# Patient Record
Sex: Male | Born: 1960
Health system: Southern US, Community
[De-identification: ages and names within clinical notes are randomized; demographics above are authoritative.]

## PROBLEM LIST (undated history)

## (undated) DIAGNOSIS — R519 Headache, unspecified: Secondary | ICD-10-CM

## (undated) DIAGNOSIS — I480 Paroxysmal atrial fibrillation: Secondary | ICD-10-CM

## (undated) DIAGNOSIS — R32 Unspecified urinary incontinence: Secondary | ICD-10-CM

## (undated) DIAGNOSIS — G4733 Obstructive sleep apnea (adult) (pediatric): Secondary | ICD-10-CM

## (undated) DIAGNOSIS — M1711 Unilateral primary osteoarthritis, right knee: Secondary | ICD-10-CM

## (undated) DIAGNOSIS — I10 Essential (primary) hypertension: Secondary | ICD-10-CM

## (undated) DIAGNOSIS — R079 Chest pain, unspecified: Secondary | ICD-10-CM

## (undated) DIAGNOSIS — J189 Pneumonia, unspecified organism: Secondary | ICD-10-CM

## (undated) DIAGNOSIS — M1712 Unilateral primary osteoarthritis, left knee: Secondary | ICD-10-CM

## (undated) DIAGNOSIS — T8482XA Fibrosis due to internal orthopedic prosthetic devices, implants and grafts, initial encounter: Secondary | ICD-10-CM

## (undated) DIAGNOSIS — E669 Obesity, unspecified: Secondary | ICD-10-CM

## (undated) DIAGNOSIS — I119 Hypertensive heart disease without heart failure: Secondary | ICD-10-CM

## (undated) DIAGNOSIS — R0600 Dyspnea, unspecified: Secondary | ICD-10-CM

## (undated) DIAGNOSIS — I5032 Chronic diastolic (congestive) heart failure: Secondary | ICD-10-CM

## (undated) DIAGNOSIS — I82409 Acute embolism and thrombosis of unspecified deep veins of unspecified lower extremity: Secondary | ICD-10-CM

## (undated) DIAGNOSIS — M25662 Stiffness of left knee, not elsewhere classified: Secondary | ICD-10-CM

## (undated) DIAGNOSIS — I2699 Other pulmonary embolism without acute cor pulmonale: Secondary | ICD-10-CM

## (undated) DIAGNOSIS — I499 Cardiac arrhythmia, unspecified: Secondary | ICD-10-CM

## (undated) DIAGNOSIS — R51 Headache: Secondary | ICD-10-CM

## (undated) DIAGNOSIS — M1990 Unspecified osteoarthritis, unspecified site: Secondary | ICD-10-CM

## (undated) DIAGNOSIS — K219 Gastro-esophageal reflux disease without esophagitis: Secondary | ICD-10-CM

## (undated) HISTORY — PX: CARDIAC CATHETERIZATION: SHX172

## (undated) HISTORY — DX: Unspecified urinary incontinence: R32

## (undated) HISTORY — DX: Unspecified osteoarthritis, unspecified site: M19.90

## (undated) HISTORY — DX: Paroxysmal atrial fibrillation: I48.0

## (undated) HISTORY — PX: ROTATOR CUFF REPAIR: SHX139

## (undated) HISTORY — PX: ORTHOPEDIC SURGERY: SHX850

## (undated) HISTORY — PX: JOINT REPLACEMENT: SHX530

## (undated) HISTORY — PX: APPENDECTOMY: SHX54

## (undated) SURGERY — IVC FILTER INSERTION
Anesthesia: Moderate Sedation

---

## 2002-11-27 ENCOUNTER — Other Ambulatory Visit: Payer: Self-pay

## 2004-01-05 ENCOUNTER — Emergency Department: Payer: Self-pay | Admitting: Emergency Medicine

## 2005-04-14 ENCOUNTER — Emergency Department: Payer: Self-pay | Admitting: Emergency Medicine

## 2006-07-28 ENCOUNTER — Emergency Department (HOSPITAL_COMMUNITY): Admission: EM | Admit: 2006-07-28 | Discharge: 2006-07-28 | Payer: Self-pay | Admitting: Family Medicine

## 2006-07-29 ENCOUNTER — Encounter: Admission: RE | Admit: 2006-07-29 | Discharge: 2006-07-29 | Payer: Self-pay | Admitting: Orthopedic Surgery

## 2008-06-23 ENCOUNTER — Emergency Department: Payer: Self-pay | Admitting: Internal Medicine

## 2008-07-04 ENCOUNTER — Emergency Department: Payer: Self-pay | Admitting: Emergency Medicine

## 2009-03-04 ENCOUNTER — Emergency Department: Payer: Self-pay | Admitting: Emergency Medicine

## 2010-12-20 ENCOUNTER — Emergency Department: Payer: Self-pay | Admitting: Internal Medicine

## 2011-08-15 ENCOUNTER — Observation Stay (HOSPITAL_COMMUNITY)
Admission: EM | Admit: 2011-08-15 | Discharge: 2011-08-17 | Disposition: A | Discharge status: Home / Self Care | Payer: PRIVATE HEALTH INSURANCE | Attending: Internal Medicine | Admitting: Internal Medicine

## 2011-08-15 ENCOUNTER — Encounter (HOSPITAL_COMMUNITY): Payer: Self-pay | Admitting: *Deleted

## 2011-08-15 ENCOUNTER — Emergency Department (HOSPITAL_COMMUNITY): Payer: PRIVATE HEALTH INSURANCE

## 2011-08-15 DIAGNOSIS — I517 Cardiomegaly: Secondary | ICD-10-CM

## 2011-08-15 DIAGNOSIS — R7309 Other abnormal glucose: Secondary | ICD-10-CM | POA: Insufficient documentation

## 2011-08-15 DIAGNOSIS — Z6841 Body Mass Index (BMI) 40.0 and over, adult: Secondary | ICD-10-CM | POA: Insufficient documentation

## 2011-08-15 DIAGNOSIS — I10 Essential (primary) hypertension: Secondary | ICD-10-CM | POA: Diagnosis present

## 2011-08-15 DIAGNOSIS — R079 Chest pain, unspecified: Principal | ICD-10-CM | POA: Insufficient documentation

## 2011-08-15 DIAGNOSIS — G4733 Obstructive sleep apnea (adult) (pediatric): Secondary | ICD-10-CM | POA: Diagnosis present

## 2011-08-15 DIAGNOSIS — K219 Gastro-esophageal reflux disease without esophagitis: Secondary | ICD-10-CM | POA: Diagnosis present

## 2011-08-15 DIAGNOSIS — R9389 Abnormal findings on diagnostic imaging of other specified body structures: Secondary | ICD-10-CM | POA: Insufficient documentation

## 2011-08-15 DIAGNOSIS — R5381 Other malaise: Secondary | ICD-10-CM | POA: Insufficient documentation

## 2011-08-15 DIAGNOSIS — R739 Hyperglycemia, unspecified: Secondary | ICD-10-CM

## 2011-08-15 DIAGNOSIS — R0602 Shortness of breath: Secondary | ICD-10-CM | POA: Insufficient documentation

## 2011-08-15 DIAGNOSIS — F439 Reaction to severe stress, unspecified: Secondary | ICD-10-CM

## 2011-08-15 DIAGNOSIS — R5383 Other fatigue: Secondary | ICD-10-CM | POA: Insufficient documentation

## 2011-08-15 HISTORY — DX: Obesity, unspecified: E66.9

## 2011-08-15 HISTORY — DX: Essential (primary) hypertension: I10

## 2011-08-15 HISTORY — DX: Obstructive sleep apnea (adult) (pediatric): G47.33

## 2011-08-15 HISTORY — DX: Chest pain, unspecified: R07.9

## 2011-08-15 HISTORY — DX: Gastro-esophageal reflux disease without esophagitis: K21.9

## 2011-08-15 LAB — BASIC METABOLIC PANEL
BUN: 18 mg/dL (ref 6–23)
CO2: 30 mEq/L (ref 19–32)
Calcium: 9.8 mg/dL (ref 8.4–10.5)
Chloride: 104 mEq/L (ref 96–112)
Creatinine, Ser: 1.24 mg/dL (ref 0.50–1.35)
GFR calc Af Amer: 77 mL/min — ABNORMAL LOW (ref >90)
GFR calc non Af Amer: 66 mL/min — ABNORMAL LOW (ref >90)
Glucose, Bld: 157 mg/dL — ABNORMAL HIGH (ref 70–99)
Potassium: 4.2 mEq/L (ref 3.5–5.1)
Sodium: 140 mEq/L (ref 135–145)

## 2011-08-15 LAB — CBC
HCT: 46.1 % (ref 39.0–52.0)
Hemoglobin: 15.1 g/dL (ref 13.0–17.0)
MCH: 29.5 pg (ref 26.0–34.0)
MCHC: 32.8 g/dL (ref 30.0–36.0)
MCV: 90 fL (ref 78.0–100.0)
Platelets: 199 10*3/uL (ref 150–400)
RBC: 5.12 MIL/uL (ref 4.22–5.81)
RDW: 13.4 % (ref 11.5–15.5)
WBC: 9.6 10*3/uL (ref 4.0–10.5)

## 2011-08-15 LAB — HEPATIC FUNCTION PANEL
ALT: 26 U/L (ref 0–53)
AST: 23 U/L (ref 0–37)
Albumin: 3.7 g/dL (ref 3.5–5.2)
Alkaline Phosphatase: 75 U/L (ref 39–117)
Bilirubin, Direct: <0.1 mg/dL (ref 0.0–0.3)
Total Bilirubin: 0.2 mg/dL — ABNORMAL LOW (ref 0.3–1.2)
Total Protein: 7 g/dL (ref 6.0–8.3)

## 2011-08-15 LAB — PROTIME-INR
INR: 1 (ref 0.00–1.49)
Prothrombin Time: 13.4 seconds (ref 11.6–15.2)

## 2011-08-15 LAB — POCT I-STAT TROPONIN I: Troponin i, poc: 0.02 ng/mL (ref 0.00–0.08)

## 2011-08-15 LAB — APTT: aPTT: 28 seconds (ref 24–37)

## 2011-08-15 LAB — TROPONIN I: Troponin I: <0.3 ng/mL (ref <0.30)

## 2011-08-15 MED ORDER — NITROGLYCERIN 0.4 MG SL SUBL
0.4000 mg | SUBLINGUAL_TABLET | SUBLINGUAL | Status: DC | PRN
  Administered 2011-08-15 (×2): 0.4 mg via SUBLINGUAL
  Filled 2011-08-15: qty 25

## 2011-08-15 MED ORDER — ASPIRIN 81 MG PO CHEW
324.0000 mg | CHEWABLE_TABLET | Freq: Once | ORAL | Status: AC
  Administered 2011-08-15: 324 mg via ORAL
  Filled 2011-08-15: qty 4

## 2011-08-15 MED ORDER — SODIUM CHLORIDE 0.9 % IV SOLN
1000.0000 mL | INTRAVENOUS | Status: DC
  Administered 2011-08-15: 1000 mL via INTRAVENOUS

## 2011-08-15 NOTE — ED Provider Notes (Signed)
History    This chart was scribed for Richard Human, MD, MD by Smitty Pluck. The patient was seen in room APA02 and the patient's care was started at 10:34PM.   CSN: [id number]  Arrival date & time 08/15/11  2153   None     Chief Complaint  Patient presents with  . Chest Pain    (Consider location/radiation/quality/duration/timing/severity/associated sxs/prior treatment) The history is provided by the patient.   Richard [name] is a 51 y.o. male who presents to the Emergency Department complaining of intermittent, moderate left chest pain onset 2 days ago. Pt reports having SOB and lack of energy. Pt reports having diaphoresis. He reports sleeping often even when he is riding down the road in the car. Pt reports having burning sensation in left hand and tingling sensation in feet bilaterally. Reports that his urine is orange in color. Denies rash, syncope, nausea, vomiting, cough, diarrhea, abdominal pain and fever. Pt reports the pain will last 5 minutes when it occurs. The pain is a stabbing pain. Denies taking medication PTA. Pain is rated at 7/10 at worst and currently a 5/10. Pain is aggravated by breathing deeply. Denies radiation of pain. Pt reports that he has had 2 knee surgeries and appendectomy.  Denies drinking alcohol and smoking cigarettes.    Father is 83 and is in good health. Mother is 68 and has chronic back pain with multiple back surgeries. Brother that is in good health.   History reviewed. No pertinent past medical history.  Past Surgical History  Procedure Date  . Appendectomy   . Orthopedic surgery     No family history on file.  History  Substance Use Topics  . Smoking status: Never Smoker   . Smokeless tobacco: Not on file  . Alcohol Use: No     occassional      Review of Systems  All other systems reviewed and are negative.  10 Systems reviewed and all are negative for acute change except as noted in the HPI.    Allergies   Penicillins  Home Medications   Current Outpatient Rx  Name Route Sig Dispense Refill  . IBUPROFEN 200 MG PO TABS Oral Take 600 mg by mouth 2 (two) times daily as needed.      BP 152/85  Temp 97.2 F (36.2 C) (Oral)  Resp 16  Ht 5\' 9"  (1.753 m)  Wt 285 lb (129.275 kg)  BMI 42.09 kg/m2  SpO2 95%  Physical Exam  Nursing note and vitals reviewed. Constitutional: He is oriented to person, place, and time. He appears well-developed and well-nourished. No distress.       Morbidly obese  HENT:  Head: Normocephalic and atraumatic.  Eyes: Conjunctivae are normal.  Cardiovascular: Normal rate, regular rhythm and normal heart sounds.   Pulmonary/Chest: Effort normal and breath sounds normal. No respiratory distress.  Abdominal: He exhibits distension (mildly ).  Musculoskeletal: He exhibits edema (+1 bilateral legs).  Neurological: He is alert and oriented to person, place, and time.  Skin: Skin is warm and dry.  Psychiatric: He has a normal mood and affect. His behavior is normal.    ED Course  Procedures (including critical care time) DIAGNOSTIC STUDIES: Oxygen Saturation is 95% on room air, normal by my interpretation.    COORDINATION OF CARE: 10:43PM EDP discusses pt ED treatment with pt  10:45PM EDP ordered medication: Scheduled Meds:    . aspirin  324 mg Oral Once   Continuous Infusions:    .  sodium chloride 1,000 mL (08/15/11 2252)   PRN Meds:.nitroGLYCERIN     12:05 AM Results for orders placed during the hospital encounter of 08/15/11  CBC      Component Value Range   WBC 9.6  4.0 - 10.5 K/uL   RBC 5.12  4.22 - 5.81 MIL/uL   Hemoglobin 15.1  13.0 - 17.0 g/dL   HCT 16.1  09.6 - 04.5 %   MCV 90.0  78.0 - 100.0 fL   MCH 29.5  26.0 - 34.0 pg   MCHC 32.8  30.0 - 36.0 g/dL   RDW 40.9  81.1 - 91.4 %   Platelets 199  150 - 400 K/uL  BASIC METABOLIC PANEL      Component Value Range   Sodium 140  135 - 145 mEq/L   Potassium 4.2  3.5 - 5.1 mEq/L    Chloride 104  96 - 112 mEq/L   CO2 30  19 - 32 mEq/L   Glucose, Bld 157 (*) 70 - 99 mg/dL   BUN 18  6 - 23 mg/dL   Creatinine, Ser 7.82  0.50 - 1.35 mg/dL   Calcium 9.8  8.4 - 95.6 mg/dL   GFR calc non Af Amer 66 (*) >90 mL/min   GFR calc Af Amer 77 (*) >90 mL/min  TROPONIN I      Component Value Range   Troponin I <0.30  <0.30 ng/mL  PROTIME-INR      Component Value Range   Prothrombin Time 13.4  11.6 - 15.2 seconds   INR 1.00  0.00 - 1.49  APTT      Component Value Range   aPTT 28  24 - 37 seconds  HEPATIC FUNCTION PANEL      Component Value Range   Total Protein 7.0  6.0 - 8.3 g/dL   Albumin 3.7  3.5 - 5.2 g/dL   AST 23  0 - 37 U/L   ALT 26  0 - 53 U/L   Alkaline Phosphatase 75  39 - 117 U/L   Total Bilirubin 0.2 (*) 0.3 - 1.2 mg/dL   Bilirubin, Direct <2.1  0.0 - 0.3 mg/dL   Indirect Bilirubin NOT CALCULATED  0.3 - 0.9 mg/dL  POCT I-STAT TROPONIN I      Component Value Range   Troponin i, poc 0.02  0.00 - 0.08 ng/mL   Comment 3            Chest Portable 1 View  08/15/2011  *RADIOLOGY REPORT*  Clinical Data: Chest pain.  CHEST - 1 VIEW  Comparison:  None.  Findings: The heart size and mediastinal contours are within normal limits.  Both lungs are clear.  IMPRESSION: No active disease.  Original Report Authenticated By: Reola Calkins, M.D.   12:05 AM Lab tests are reassuring.  No MI.  He says his pain is better, he only feels pain if he takes a deep breath.  Call to [facility] --> case discussed with Vania Rea, who advised admission to observation status to a telemetry bed.   1. Chest pain      I personally performed the services described in this documentation, which was scribed in my presence. The recorded information has been reviewed and considered.  Richard [name], M.D.       Carleene Cooper III, MD 08/16/11 [id number]

## 2011-08-15 NOTE — ED Notes (Signed)
Chest pain onset yesterday 

## 2011-08-16 ENCOUNTER — Observation Stay (HOSPITAL_COMMUNITY): Payer: PRIVATE HEALTH INSURANCE

## 2011-08-16 ENCOUNTER — Encounter (HOSPITAL_COMMUNITY): Payer: Self-pay | Admitting: Internal Medicine

## 2011-08-16 DIAGNOSIS — I10 Essential (primary) hypertension: Secondary | ICD-10-CM

## 2011-08-16 DIAGNOSIS — I517 Cardiomegaly: Secondary | ICD-10-CM

## 2011-08-16 DIAGNOSIS — G4733 Obstructive sleep apnea (adult) (pediatric): Secondary | ICD-10-CM | POA: Diagnosis present

## 2011-08-16 DIAGNOSIS — R7309 Other abnormal glucose: Secondary | ICD-10-CM

## 2011-08-16 DIAGNOSIS — R079 Chest pain, unspecified: Secondary | ICD-10-CM

## 2011-08-16 DIAGNOSIS — R739 Hyperglycemia, unspecified: Secondary | ICD-10-CM | POA: Diagnosis present

## 2011-08-16 LAB — CARDIAC PANEL(CRET KIN+CKTOT+MB+TROPI)
CK, MB: 5.4 ng/mL — ABNORMAL HIGH (ref 0.3–4.0)
CK, MB: 4.6 ng/mL — ABNORMAL HIGH (ref 0.3–4.0)
CK, MB: 4.4 ng/mL — ABNORMAL HIGH (ref 0.3–4.0)
Relative Index: 2.6 — ABNORMAL HIGH (ref 0.0–2.5)
Relative Index: 2.7 — ABNORMAL HIGH (ref 0.0–2.5)
Relative Index: 3 — ABNORMAL HIGH (ref 0.0–2.5)
Total CK: 209 U/L (ref 7–232)
Total CK: 173 U/L (ref 7–232)
Total CK: 149 U/L (ref 7–232)
Troponin I: <0.3 ng/mL (ref <0.30)
Troponin I: <0.3 ng/mL (ref <0.30)
Troponin I: <0.3 ng/mL (ref <0.30)

## 2011-08-16 LAB — LIPID PANEL
Cholesterol: 154 mg/dL (ref 0–200)
HDL: 41 mg/dL (ref >39)
LDL Cholesterol: 99 mg/dL (ref 0–99)
Total CHOL/HDL Ratio: 3.8 RATIO
Triglycerides: 69 mg/dL (ref <150)
VLDL: 14 mg/dL (ref 0–40)

## 2011-08-16 LAB — CBC
HCT: 44.5 % (ref 39.0–52.0)
Hemoglobin: 14.7 g/dL (ref 13.0–17.0)
MCH: 30.2 pg (ref 26.0–34.0)
MCHC: 33 g/dL (ref 30.0–36.0)
MCV: 91.6 fL (ref 78.0–100.0)
Platelets: 185 10*3/uL (ref 150–400)
RBC: 4.86 MIL/uL (ref 4.22–5.81)
RDW: 13.5 % (ref 11.5–15.5)
WBC: 8.1 10*3/uL (ref 4.0–10.5)

## 2011-08-16 LAB — D-DIMER, QUANTITATIVE (NOT AT ARMC): D-Dimer, Quant: 0.29 ug/mL-FEU (ref 0.00–0.48)

## 2011-08-16 LAB — TSH: TSH: 2.697 u[IU]/mL (ref 0.350–4.500)

## 2011-08-16 LAB — BASIC METABOLIC PANEL
BUN: 18 mg/dL (ref 6–23)
CO2: 29 mEq/L (ref 19–32)
Calcium: 9.2 mg/dL (ref 8.4–10.5)
Chloride: 108 mEq/L (ref 96–112)
Creatinine, Ser: 1 mg/dL (ref 0.50–1.35)
GFR calc Af Amer: >90 mL/min (ref >90)
GFR calc non Af Amer: 86 mL/min — ABNORMAL LOW (ref >90)
Glucose, Bld: 122 mg/dL — ABNORMAL HIGH (ref 70–99)
Potassium: 4.1 mEq/L (ref 3.5–5.1)
Sodium: 144 mEq/L (ref 135–145)

## 2011-08-16 LAB — URINALYSIS, ROUTINE W REFLEX MICROSCOPIC
Bilirubin Urine: NEGATIVE
Glucose, UA: NEGATIVE mg/dL
Hgb urine dipstick: NEGATIVE
Ketones, ur: NEGATIVE mg/dL
Leukocytes, UA: NEGATIVE
Nitrite: NEGATIVE
Protein, ur: NEGATIVE mg/dL
Specific Gravity, Urine: 1.03 (ref 1.005–1.030)
Urobilinogen, UA: 0.2 mg/dL (ref 0.0–1.0)
pH: 6 (ref 5.0–8.0)

## 2011-08-16 LAB — HEMOGLOBIN A1C
Hgb A1c MFr Bld: 5.6 % (ref <5.7)
Mean Plasma Glucose: 114 mg/dL (ref <117)

## 2011-08-16 LAB — PRO B NATRIURETIC PEPTIDE: Pro B Natriuretic peptide (BNP): 43.4 pg/mL (ref 0–125)

## 2011-08-16 MED ORDER — NITROGLYCERIN 2 % TD OINT
1.0000 [in_us] | TOPICAL_OINTMENT | Freq: Four times a day (QID) | TRANSDERMAL | Status: DC
  Administered 2011-08-16 (×2): 1 [in_us] via TOPICAL
  Filled 2011-08-16 (×2): qty 1

## 2011-08-16 MED ORDER — HYDRALAZINE HCL 20 MG/ML IJ SOLN: 10.0000 mg | INTRAMUSCULAR | Status: DC | PRN

## 2011-08-16 MED ORDER — MORPHINE SULFATE 2 MG/ML IJ SOLN: 2.0000 mg | INTRAMUSCULAR | Status: DC | PRN

## 2011-08-16 MED ORDER — SODIUM CHLORIDE 0.9 % IJ SOLN
3.0000 mL | Freq: Two times a day (BID) | INTRAMUSCULAR | Status: DC
  Filled 2011-08-16: qty 3

## 2011-08-16 MED ORDER — ACETAMINOPHEN 325 MG PO TABS
650.0000 mg | ORAL_TABLET | ORAL | Status: DC | PRN
  Administered 2011-08-16 – 2011-08-17 (×2): 650 mg via ORAL
  Filled 2011-08-16 (×2): qty 2

## 2011-08-16 MED ORDER — CAPTOPRIL 25 MG PO TABS
25.0000 mg | ORAL_TABLET | Freq: Three times a day (TID) | ORAL | Status: DC
  Administered 2011-08-16 – 2011-08-17 (×2): 25 mg via ORAL
  Filled 2011-08-16 (×2): qty 1

## 2011-08-16 MED ORDER — SODIUM CHLORIDE 0.9 % IV BOLUS (SEPSIS)
1000.0000 mL | Freq: Once | INTRAVENOUS | Status: AC
  Administered 2011-08-16: 1000 mL via INTRAVENOUS

## 2011-08-16 MED ORDER — SODIUM CHLORIDE 0.9 % IJ SOLN
INTRAMUSCULAR | Status: AC
  Filled 2011-08-16: qty 3

## 2011-08-16 MED ORDER — POTASSIUM CHLORIDE IN NACL 20-0.9 MEQ/L-% IV SOLN
INTRAVENOUS | Status: DC
  Administered 2011-08-16 – 2011-08-17 (×4): via INTRAVENOUS

## 2011-08-16 MED ORDER — ASPIRIN EC 81 MG PO TBEC
81.0000 mg | DELAYED_RELEASE_TABLET | Freq: Every day | ORAL | Status: DC
  Administered 2011-08-16 – 2011-08-17 (×2): 81 mg via ORAL
  Filled 2011-08-16 (×2): qty 1

## 2011-08-16 MED ORDER — NITROGLYCERIN 2 % TD OINT: 0.5000 [in_us] | TOPICAL_OINTMENT | TRANSDERMAL | Status: DC

## 2011-08-16 MED ORDER — FLEET ENEMA 7-19 GM/118ML RE ENEM: 1.0000 | ENEMA | Freq: Once | RECTAL | Status: AC | PRN

## 2011-08-16 MED ORDER — OXYCODONE HCL 5 MG PO TABS
5.0000 mg | ORAL_TABLET | ORAL | Status: DC | PRN
  Administered 2011-08-17: 5 mg via ORAL
  Filled 2011-08-16: qty 1

## 2011-08-16 MED ORDER — BISACODYL 5 MG PO TBEC: 5.0000 mg | DELAYED_RELEASE_TABLET | Freq: Every day | ORAL | Status: DC | PRN

## 2011-08-16 MED ORDER — SENNOSIDES-DOCUSATE SODIUM 8.6-50 MG PO TABS: 1.0000 | ORAL_TABLET | Freq: Every evening | ORAL | Status: DC | PRN

## 2011-08-16 MED ORDER — PANTOPRAZOLE SODIUM 40 MG PO TBEC
40.0000 mg | DELAYED_RELEASE_TABLET | Freq: Every day | ORAL | Status: DC
  Administered 2011-08-16 – 2011-08-17 (×2): 40 mg via ORAL
  Filled 2011-08-16 (×2): qty 1

## 2011-08-16 MED ORDER — AMLODIPINE BESYLATE 5 MG PO TABS
10.0000 mg | ORAL_TABLET | Freq: Every day | ORAL | Status: DC
  Administered 2011-08-16 – 2011-08-17 (×2): 10 mg via ORAL
  Filled 2011-08-16 (×2): qty 2

## 2011-08-16 MED ORDER — ENOXAPARIN SODIUM 40 MG/0.4ML ~~LOC~~ SOLN
40.0000 mg | SUBCUTANEOUS | Status: DC
  Administered 2011-08-16 – 2011-08-17 (×2): 40 mg via SUBCUTANEOUS
  Filled 2011-08-16 (×2): qty 0.4

## 2011-08-16 NOTE — Progress Notes (Signed)
This pt was screened for PT since a consult request was received.  He is not having any problems with mobility and is not appropriate for a PT eval.  Please reconsult if status changes.

## 2011-08-16 NOTE — Consult Note (Signed)
CARDIOLOGY CONSULT NOTE  Patient ID: Richard [name] MRN: [id number] DOB/AGE: 50/50/1962 50 y.o.  Admit date: 08/15/2011 Referring Physician: PTH (Fischer) Primary Physician: Delfino Lovett) Primary Cardiologist: [place] Murin Reason for Consultation: Chest Pain Active Problems:  Morbid obesity  Chest pain  OSA (obstructive sleep apnea)  Hyperglycemia  HPI: Richard [name] is a 51 year old morbidly obese Caucasian male patient with no prior cardiac history who presented to the emergency room after walking around [employer] with the with increasing chest discomfort with inspiration. This is described as a "sticking feeling"  He states he has been having chest discomfort associated with inspiration for the last couple of weeks. He states he is not had any prior chest discomfort or problems with breathing. He believes he has some sleep apnea and had deferred a sleep study in the past but is willing to proceed. He is currently on CPAP now in the hospital and is feeling better. He states that he does have some diaphoresis associated with taking deep breaths, along with some dizziness and tingling in his hands.     He has not followed regularly by a physician. He has seen Dr. Vear Clock in [place] but only for minor illnesses on a when necessary basis. On arrival to the emergency room the patient's blood pressure was 152/85 with  heart rate of 82, O2 sat 95% on room air. Chest x-ray revealed no active disease, cardiac enzymes are found be negative, chemistries and CBC were also within normal limits. EKG was normal sinus rhythm with evidence of left atrial enlargement, but no ischemia. He was treated with nitroglycerin and aspirin and admitted to rule out myocardial infarction.            Review of systems complete and found to be negative unless listed above   Past Medical History  Diagnosis Date  . Obesity   . OSA (obstructive sleep apnea)     Family History  Problem Relation Age of Onset  .  Coronary artery disease Mother   . Coronary artery disease Father     History   Social History  . Marital Status: Married    Spouse Name: N/A    Number of Children: N/A  . Years of Education: N/A   Occupational History  . tiler    Social History Main Topics  . Smoking status: Never Smoker   . Smokeless tobacco: Never Used  . Alcohol Use: No     occassional  . Drug Use: No  . Sexually Active:    Other Topics Concern  . Not on file   Social History Narrative  . No narrative on file    Past Surgical History  Procedure Date  . Appendectomy   . Orthopedic surgery      Prescriptions prior to admission  Medication Sig Dispense Refill  . ibuprofen (ADVIL,MOTRIN) 200 MG tablet Take 600 mg by mouth 2 (two) times daily as needed.        Physical Exam: Blood pressure 165/87, pulse 74, temperature 97.8 F (36.6 C), temperature source Oral, resp. rate 20, height 5\' 9"  (1.753 m), weight 308 lb 6.4 oz (139.889 kg), SpO2 94.00%.  General: Well developed, well nourished, in no acute distress, morbidly obese. Head: Eyes PERRLA, No xanthomas.   Normal cephalic and atramatic  Lungs: Clear bilaterally to auscultation and percussion. Heart: HRRR S1 S2, without MRG.  Pulses are 2+ & equal.            No carotid bruit. Neck obese No JVD.  No  abdominal bruits. No femoral bruits. Abdomen: Bowel sounds are positive, abdomen soft and non-tender without masses or                  Hernia's noted. Msk:  Back normal, normal gait. Normal strength and tone for age. Extremities: No clubbing, cyanosis or edema.  DP +1 Neuro: Alert and oriented X 3. Psych:  Good affect, responds appropriately   Labs:   Lab Results  Component Value Date   WBC 8.1 08/16/2011   HGB 14.7 08/16/2011   HCT 44.5 08/16/2011   MCV 91.6 08/16/2011   PLT 185 08/16/2011    Lab 08/16/11 0447 08/15/11 2214  NA 144 --  K 4.1 --  CL 108 --  CO2 29 --  BUN 18 --  CREATININE 1.00 --  CALCIUM 9.2 --  PROT -- 7.0    BILITOT -- 0.2*  ALKPHOS -- 75  ALT -- 26  AST -- 23  GLUCOSE 122* --   Lab Results  Component Value Date   CKTOTAL 209 08/16/2011   CKMB 5.4* 08/16/2011   TROPONINI <0.30 08/16/2011    Lab Results  Component Value Date   CHOL 154 08/16/2011   Lab Results  Component Value Date   HDL 41 08/16/2011   Lab Results  Component Value Date   LDLCALC 99 08/16/2011   Lab Results  Component Value Date   TRIG 69 08/16/2011   Lab Results  Component Value Date   CHOLHDL 3.8 08/16/2011   No results found for this basename: LDLDIRECT      Radiology: Chest Portable 1 View  08/15/2011  *RADIOLOGY REPORT*  Clinical Data: Chest pain.  CHEST - 1 VIEW  Comparison:  None.  Findings: The heart size and mediastinal contours are within normal limits.  Both lungs are clear.  IMPRESSION: No active disease.  Original Report Authenticated By: Reola Calkins, M.D.   EKG:NSR with left atrial enlargment  ASSESSMENT AND PLAN:   1. Atypical chest pain: Occurs with deep inspiration only. This does not occur at rest or with exertion. Described as a sticking pain. D-dimer 0. 29. Cardiac enzymes are negative. EKG is normal with some left atrial enlargement. Echocardiogram has been ordered, for evaluation of LV function and cardiac structure. He does have risk factors for coronary artery disease to include obesity and family history. Both parents have CAD with stent placement at ages in the 50s. Can certainly proceed with a stress Myoview and  be done as an outpatient. Due to body habitus this will need to be a two day study for better penetration.  2. Probable obstructive sleep apnea: He has been placed on CPAP during this hospitalization and states that he breathes better on it. High pulmonary pressures, and musculoskeletal pain can be associated with this, causing discomfort. He is severely obese and would recommend weight loss. May need referral to [facility] on discharge.  3. Hypertension: He is not  on any antihypertensives as an outpatient. He is currently on nitroglycerin paste with blood pressure ranging in the 130s to 150s systolic. Initially hypertensive on admission. Would recommend antihypertensive, amlodipine 5 mg daily. With probable obstructive sleep apnea hypertension can be an issue.  4. Rule out GERD: Although his discomfort is mostly related to inspiration, can consider evaluation for this and addition of PPI at your discretion. H. pylori titer can also be considered.                Signed: Bettey Mare. Lyman Bishop NP  [facility] 08/16/2011, 9:35 AM Co-Sign MD I have taken a history, reviewed medications, allergies, PMH, SH, FH, and reviewed ROS and examined the patient.  I agree with the assessment and plan . I strongly recommend weight loss and a sleep study, No cardiac follow up as this time.  Sashia Campas C. Daleen Squibb, MD, [facility] Pager:  [phone]

## 2011-08-16 NOTE — Progress Notes (Signed)
The patient is a 51 year old man with a history significant for obesity and obstructive sleep apnea, who was admitted this morning for chest pain. He was briefly seen. His chart, laboratory studies, and labs were reviewed. Will decrease nitroglycerin paste to a 1/2 inch. If he rules out for a myocardial infarction, we'll discontinue it altogether. Cardiology consult is pending. The patient may need a cardiac stress test in the hospital versus as an outpatient. Will defer this decision to cardiology. Possibly home today if he rules out versus in the morning if further testing is desired by cardiology.

## 2011-08-16 NOTE — Progress Notes (Signed)
UR Chart Review Completed  

## 2011-08-16 NOTE — Care Management Note (Unsigned)
    Page 1 of 1   08/16/2011     1:48:17 PM   CARE MANAGEMENT NOTE 08/16/2011  Patient:  Richard [name], Richard [name]   Account Number:  [id number]  Date Initiated:  08/16/2011  Documentation initiated by:  Sharrie Rothman  Subjective/Objective Assessment:   Pt admitted from home with CP. Pt lives with wife and is independent with ADL's. Pt will return home at discharge.     Action/Plan:   CM will continue to follow for any HH needs.   Anticipated DC Date:  08/17/2011   Anticipated DC Plan:  HOME/SELF CARE      DC Planning Services  CM consult      Choice offered to / List presented to:             Status of service:  In process, will continue to follow Medicare Important Message given?   (If response is "NO", the following Medicare IM given date fields will be blank) Date Medicare IM given:   Date Additional Medicare IM given:    Discharge Disposition:    Per UR Regulation:    If discussed at Long Length of Stay Meetings, dates discussed:    Comments:  08/16/11 1347 Arlyss Queen, RN BSN CM

## 2011-08-16 NOTE — ED Notes (Signed)
Attempted to call report, was told the nurse would call me back

## 2011-08-16 NOTE — Progress Notes (Signed)
*  PRELIMINARY RESULTS* Echocardiogram 2D Echocardiogram has been performed.  Richard [name] 08/16/2011, 11:54 AM

## 2011-08-16 NOTE — Progress Notes (Signed)
Patient's blood pressure was 176/94. Doctor was notified and new orders were given. Will continue to monitor patient.

## 2011-08-16 NOTE — H&P (Signed)
[facility] History and Physical  Richard [name]:[id number] DOB: 01/16/1961 DOA: 08/16/2011    PCP:   No primary provider on file.   Chief Complaint:  Chest pain since yesterday  HPI: Richard [name] is an 51 y.o. male.   Morbidly obese Caucasian gentleman with no medical followup, was recommended to a sleep study 5 years ago but did not attend because he was apprehensive, has been having frequent episodic sticking chest pains in the precordial and upper back region in yesterday. His been unable to identify any aggravating or relieving factors, the pain lasts about 10 minutes, occurs about every 10-15 minutes, is not colicky, and is associated with nausea and dizziness. Pain seems to be worse today than yesterday and he therefore came to the to the emergency room to be evaluated. The pain is aggravated by breathing  The patient reports that his wife complains about his snoring and that he sometimes stops breathing during sleep. He wakes every morning feeling tired and falls asleep frequently at work and even while driving.  He does not smoke, has been waking up at night for the past couple of days short of breath relieved by sitting up and recurring again when he goes back to sleep.  He has also been reporting tingling and burning of both hands left more so than the right, which is sometimes caused him to drop his stools and the utensils from the left hand. This has been occurring over the past 2-3 months.  Rewiew of Systems:   All systems negative except as marked bold or noted in the HPI;  Constitutional: Negative for malaise, fever and chills. ;  Eyes: Negative for eye pain, redness and discharge. ;  ENMT: Negative for ear pain, hoarseness, nasal congestion, sinus pressure and sore throat. ;  Cardiovascular:  chest pain, palpitations, diaphoresis, dyspnea . ;  Respiratory: Negative for  hemoptysis, wheezing and stridor. ;  Gastrointestinal: Negative for , vomiting,  diarrhea, constipation, abdominal pain, melena, blood in stool, hematemesis, jaundice and rectal bleeding. unusual weight loss..   Genitourinary: Negative for frequency, dysuria, incontinence,flank pain and hematuria; Musculoskeletal: Negative for back pain and neck pain. Negative for swelling and trauma.;  Skin: . Negative for pruritus, rash, abrasions, bruising and skin lesion.; ulcerations Neuro: Negative for headache, lightheadedness and neck stiffness. Negative for weakness, altered level of consciousness , altered mental status, extremity weakness, burning feet, involuntary movement, seizure and syncope.  Psych: negative for anxiety, depression, insomnia, tearfulness, panic attacks, hallucinations, paranoia, suicidal or homicidal ideation    Past Medical History  Diagnosis Date  . Obesity   . OSA (obstructive sleep apnea)     Past Surgical History  Procedure Date  . Appendectomy   . Orthopedic surgery     Medications:  HOME MEDS: Prior to Admission medications   Medication Sig Start Date End Date Taking? Authorizing Provider  ibuprofen (ADVIL,MOTRIN) 200 MG tablet Take 600 mg by mouth 2 (two) times daily as needed.   Yes Historical Provider, MD     Allergies:  Allergies  Allergen Reactions  . Penicillins Other (See Comments)    REACTION: UNKNOWN    Social History:   reports that he has never smoked. He does not have any smokeless tobacco history on file. He reports that he does not drink alcohol. His drug history not on file.  Family History: Family History  Problem Relation Age of Onset  . Coronary artery disease Mother   . Coronary artery disease Father  Physical Exam: Filed Vitals:   08/15/11 2300 08/15/11 2330 08/16/11 0030 08/16/11 0100  BP: 122/71 96/71 120/80 132/78  Pulse: 82 83 71 71  Temp:      TempSrc:      Resp: 17 18 19 23   Height:      Weight:      SpO2: 93% 96% 96% 94%   Blood pressure 132/78, pulse 71, temperature 97.2 F (36.2 C),  temperature source Oral, resp. rate 23, height 5\' 9"  (1.753 m), weight 129.275 kg (285 lb), SpO2 94.00%.  GEN:  Pleasant morbidly obese Caucasian gentleman lying in the stretcher in no acute distress; cooperative with exam PSYCH:  alert and oriented x4; does not appear anxious or depressed; affect is appropriate. HEENT: Mucous membranes pink and anicteric; PERRLA; EOM intact; no cervical lymphadenopathy nor thyromegaly or carotid bruit; no JVD; very thick neck Breasts:: Not examined CHEST WALL: No tenderness CHEST: Normal respiration, clear to auscultation bilaterally HEART: Regular rate and rhythm; no murmurs rubs or gallops BACK: ; no CVA tenderness ABDOMEN: Morbidly Obese, soft non-tender; no masses, no organomegaly, normal abdominal bowel sounds; large pannus; no intertriginous candida. Rectal Exam: Not done EXTREMITIES:age-appropriate arthropathy of the hands and knees; no edema; no ulcerations. Genitalia: not examined PULSES: 2+ and symmetric SKIN: Normal hydration no rash or ulceration CNS: Cranial nerves 2-12 grossly intact no focal lateralizing neurologic deficit   Labs on Admission:  Basic Metabolic Panel:  Lab 08/15/11 4540  NA 140  K 4.2  CL 104  CO2 30  GLUCOSE 157*  BUN 18  CREATININE 1.24  CALCIUM 9.8  MG --  PHOS --   Liver Function Tests:  Lab 08/15/11 2214  AST 23  ALT 26  ALKPHOS 75  BILITOT 0.2*  PROT 7.0  ALBUMIN 3.7   No results found for this basename: LIPASE:5,AMYLASE:5 in the last 168 hours No results found for this basename: AMMONIA:5 in the last 168 hours CBC:  Lab 08/15/11 2214  WBC 9.6  NEUTROABS --  HGB 15.1  HCT 46.1  MCV 90.0  PLT 199   Cardiac Enzymes:  Lab 08/15/11 2214  CKTOTAL --  CKMB --  CKMBINDEX --  TROPONINI <0.30   BNP: No components found with this basename: POCBNP:5 CBG: No results found for this basename: GLUCAP:5 in the last 168 hours  Results for orders placed during the hospital encounter of 08/15/11  (from the past 48 hour(s))  CBC     Status: Normal   Collection Time   08/15/11 10:14 PM      Component Value Range Comment   WBC 9.6  4.0 - 10.5 K/uL    RBC 5.12  4.22 - 5.81 MIL/uL    Hemoglobin 15.1  13.0 - 17.0 g/dL    HCT 98.1  19.1 - 47.8 %    MCV 90.0  78.0 - 100.0 fL    MCH 29.5  26.0 - 34.0 pg    MCHC 32.8  30.0 - 36.0 g/dL    RDW 29.5  62.1 - 30.8 %    Platelets 199  150 - 400 K/uL   BASIC METABOLIC PANEL     Status: Abnormal   Collection Time   08/15/11 10:14 PM      Component Value Range Comment   Sodium 140  135 - 145 mEq/L    Potassium 4.2  3.5 - 5.1 mEq/L    Chloride 104  96 - 112 mEq/L    CO2 30  19 - 32 mEq/L  Glucose, Bld 157 (*) 70 - 99 mg/dL    BUN 18  6 - 23 mg/dL    Creatinine, Ser 5.62  0.50 - 1.35 mg/dL    Calcium 9.8  8.4 - 13.0 mg/dL    GFR calc non Af Amer 66 (*) >90 mL/min    GFR calc Af Amer 77 (*) >90 mL/min   TROPONIN I     Status: Normal   Collection Time   08/15/11 10:14 PM      Component Value Range Comment   Troponin I <0.30  <0.30 ng/mL   HEPATIC FUNCTION PANEL     Status: Abnormal   Collection Time   08/15/11 10:14 PM      Component Value Range Comment   Total Protein 7.0  6.0 - 8.3 g/dL    Albumin 3.7  3.5 - 5.2 g/dL    AST 23  0 - 37 U/L    ALT 26  0 - 53 U/L    Alkaline Phosphatase 75  39 - 117 U/L    Total Bilirubin 0.2 (*) 0.3 - 1.2 mg/dL    Bilirubin, Direct <8.6  0.0 - 0.3 mg/dL    Indirect Bilirubin NOT CALCULATED  0.3 - 0.9 mg/dL   PROTIME-INR     Status: Normal   Collection Time   08/15/11 10:36 PM      Component Value Range Comment   Prothrombin Time 13.4  11.6 - 15.2 seconds    INR 1.00  0.00 - 1.49   APTT     Status: Normal   Collection Time   08/15/11 10:36 PM      Component Value Range Comment   aPTT 28  24 - 37 seconds   POCT I-STAT TROPONIN I     Status: Normal   Collection Time   08/15/11 10:48 PM      Component Value Range Comment   Troponin i, poc 0.02  0.00 - 0.08 ng/mL    Comment 3                Radiological Exams on Admission: Chest Portable 1 View  08/15/2011  *RADIOLOGY REPORT*  Clinical Data: Chest pain.  CHEST - 1 VIEW  Comparison:  None.  Findings: The heart size and mediastinal contours are within normal limits.  Both lungs are clear.  IMPRESSION: No active disease.  Original Report Authenticated By: Reola Calkins, M.D.    EKG: Independently reviewed. Sinus rhythm, left axis deviation.   Assessment/Plan Present on Admission:  .Morbid obesity .Chest pain Peripheral neuropathy involving the upper extremity  .OSA (obstructive sleep apnea) .Hyperglycemia   PLAN: We'll admit this gentleman for cardiac rule out, just on his risk factors of age, and morbid obesity; sprain possibly also related to his obstructive sleep apnea;  Taking a trip the pain and neuropathic symptoms described in his arms are concerning for myelopathy, and he may benefit from MRI of the cervical and thoracic spine.  Counsel on gradual weight loss and maintenance of normal weight and reduction of cardiac risks. His hyperglycemia will likely improve with weight loss.  We'll give CPAP trial while in hospital, and he feels better on CPAP it may encourage him to get a formal sleep study.  Other plans as per orders.  Code Status: FULL CODE  Family Communication: Richard [name] [phone] cell  Disposition Plan:     Richard [name] Nocturnist [facility] Pager [phone]  08/16/2011, 1:18 AM

## 2011-08-17 ENCOUNTER — Encounter (HOSPITAL_COMMUNITY): Payer: Self-pay | Admitting: Internal Medicine

## 2011-08-17 DIAGNOSIS — I517 Cardiomegaly: Secondary | ICD-10-CM | POA: Diagnosis present

## 2011-08-17 DIAGNOSIS — K219 Gastro-esophageal reflux disease without esophagitis: Secondary | ICD-10-CM | POA: Diagnosis present

## 2011-08-17 DIAGNOSIS — F439 Reaction to severe stress, unspecified: Secondary | ICD-10-CM

## 2011-08-17 DIAGNOSIS — I10 Essential (primary) hypertension: Secondary | ICD-10-CM | POA: Diagnosis present

## 2011-08-17 MED ORDER — AMLODIPINE BESYLATE 10 MG PO TABS: 10.0000 mg | ORAL_TABLET | Freq: Every day | ORAL | Status: DC

## 2011-08-17 MED ORDER — OMEPRAZOLE 20 MG PO CPDR: 20.0000 mg | DELAYED_RELEASE_CAPSULE | Freq: Every day | ORAL | Status: DC

## 2011-08-17 MED ORDER — ACETAMINOPHEN 500 MG PO TABS: 500.0000 mg | ORAL_TABLET | Freq: Four times a day (QID) | ORAL | Status: AC | PRN

## 2011-08-17 NOTE — Progress Notes (Signed)
Pt discharged home via personal vehicle, accompanied by spouse. Currently voices no c/o pain. Discharge instructions and meds reviewed with pt with good understanding.

## 2011-08-17 NOTE — Discharge Summary (Signed)
Physician Discharge Summary  Richard [name] RUE:[id number] DOB: 09/02/1960 DOA: 08/15/2011  PCP: Loma Sender, M.D.  Admit date: 08/15/2011 Discharge date: 08/17/2011  Recommendations for Outpatient Follow-up:  The patient was discharged to home. He was advised to followup with Dr. Vear Clock to schedule an outpatient sleep study.  Discharge Diagnoses:  1. Chest pain. Myocardial infarction ruled out. 2. Moderate LVH, per 2-D echocardiogram on 08/16/2011. 3. Probable obstructive sleep apnea. 4. Hyperglycemia. Resolved. Hemoglobin A1c was 5.6. 5. Morbid obesity. 6. Newly diagnosed hypertension. 7. Stress.  8. Gastroesophageal reflux disease.  9. Paresthesias. 10. Normal fasting lipid panel. Total cholesterol 154, triglycerides 69, HDL cholesterol 41, and LDL cholesterol 99.  Discharge Condition: improved.  Diet recommendation: heart healthy.  History of present illness:  The patient is a 51 year old man with a history significant for obesity and probable obstructive sleep apnea, who presented to the emergency department on 08/15/2011 with a chief complaint of chest pain. He also complained of some numbness and tingling in his extremities. In the emergency department, he was afebrile and hemodynamically stable. His lab data were significant for a glucose of 157, normal LFTs, and normal CBC. His troponin I. was less than 0.30. His chest x-ray revealed no active disease. His EKG revealed a heart rate of 76 beats per minute, left axis deviation, and left atrial enlargement. He was admitted for further evaluation and management.  Hospital Course:  The patient was started on supportive treatment with IV fluids, as needed morphine, and as needed Zofran. Proton pump inhibitor therapy was started. He was also started on aspirin empirically. He was suspected to have obstructive sleep apnea per history, and therefore, CPAP was ordered. He tolerated CPAP well. Because of his obesity, he was advised to  lose weight by exercising and following a low-fat diet. For further evaluation, a number of studies were ordered. All of his cardiac enzymes were well within normal limits. His pro BNP was 43. His 2-D echocardiogram revealed moderate LVH and preserved systolic function, although, the study was limited. His TSH was within normal limits. His hemoglobin A1c was within normal limits. His fasting lipid profile results were dictated above. Cardiology was consulted. They agreed with medical management, but added captopril and Norvasc for treatment of newly diagnosed hypertension. Today recommended weight loss and an outpatient sleep study which hour to be in discussed with the patient by the dictating physician. Dr. Daleen Squibb did not believe the patient needed further cardiac workup.  MRI of the cervical spine was ordered for evaluation of paresthesias in his hands. However, because of his body habitus, he could not fit into the MRI scanner. 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 There were no neurological findings on exam. Further evaluation will be deferred to his primary care physician.  The patient became symptom free. He disclosed that he had been under a lot of stress lately and thought that maybe the chest pain was secondary to stress. He also has a history of gastroesophageal reflux disease and was told in the past to take Nexium, but he could not afford it. He was advised to avoid NSAIDs and to use Tylenol as needed for pain. He was informed of over-the-counter Prilosec or generic Prilosec (omeprazole) which would be less expensive. His primary care physician, Dr. Vear Clock had ordered a sleep study in the past, however, he did not keep the appointment. I advised him to followup with Dr. Vear Clock to reschedule the sleep study. He was also instructed to  take amlodipine daily for treatment of hypertension at the time of discharge.    Procedures: 2-D echocardiogram: Study  Conclusions  - Left ventricle: The cavity size was normal. Wall thickness was increased in a pattern of moderate LVH. Systolic function was normal. - Mitral valve: Calcified annulus. - Left atrium: The atrium was mildly dilated. Impressions:  - Extremely limited due to poor sound wave transmission; essentially no apical or subcostal views; LV function appears to be preserved on parasternal views; cannot R/O wall motion abnormality; suggest TEE or MUGA if clinically indicated. Transthoracic echocardiography. M-mode, complete 2D, spectral Doppler, and color Doppler. Height: Height: 175.3cm. Height: 69in. Weight: Weight: 139.7kg. Weight: 307.4lb. Body mass index: BMI: 45.5kg/m^2. Body surface area: BSA: 2.74m^2. Patient status: Inpatient.     Consultations:  Juanito Doom, M.D.  Discharge Exam: Filed Vitals:   08/17/11 0622  BP: 112/74  Pulse: 64  Temp: 97.7 F (36.5 C)  Resp: 18   Filed Vitals:   08/17/11 0220 08/17/11 0457 08/17/11 0622 08/17/11 0741  BP: 130/87  112/74   Pulse: 64  64   Temp: 97.7 F (36.5 C)  97.7 F (36.5 C)   TempSrc: Oral  Oral   Resp: 18  18   Height:      Weight:  140.6 kg (309 lb 15.5 oz) 142.7 kg (314 lb 9.5 oz)   SpO2: 97%  96% 96%    General: Obese Caucasian man laying in bed, in no acute distress. Cardiovascular: S1, S2, with no murmurs rubs or gallops. Respiratory: clear to auscultation bilaterally.  Discharge Instructions   Medication List  As of 08/17/2011  9:32 AM   STOP taking these medications         ibuprofen 200 MG tablet         TAKE these medications         acetaminophen 500 MG tablet   Commonly known as: TYLENOL   Take 1 tablet (500 mg total) by mouth every 6 (six) hours as needed for pain.      amLODipine 10 MG tablet   Commonly known as: NORVASC   Take 1 tablet (10 mg total) by mouth daily.      omeprazole 20 MG capsule   Commonly known as: PRILOSEC   Take 1 capsule (20 mg total) by mouth daily.             Follow-up Information    Follow up with Raliegh Ip, MD. Schedule an appointment as soon as possible for a visit in 1 week.   Contact information:   [address] [place] [address] [phone]           The results of significant diagnostics from this hospitalization (including imaging, microbiology, ancillary and laboratory) are listed below for reference.    Significant Diagnostic Studies: Chest Portable 1 View  08/15/2011  *RADIOLOGY REPORT*  Clinical Data: Chest pain.  CHEST - 1 VIEW  Comparison:  None.  Findings: The heart size and mediastinal contours are within normal limits.  Both lungs are clear.  IMPRESSION: No active disease.  Original Report Authenticated By: Reola Calkins, M.D.    Microbiology: No results found for this or any previous visit (from the past 240 hour(s)).   Labs: Basic Metabolic Panel:  Lab 08/16/11 5621 08/15/11 2214  NA 144 140  K 4.1 4.2  CL 108 104  CO2 29 30  GLUCOSE 122* 157*  BUN 18 18  CREATININE 1.00 1.24  CALCIUM 9.2 9.8  MG -- --  PHOS -- --   Liver Function Tests:  Lab 08/15/11 2214  AST 23  ALT 26  ALKPHOS 75  BILITOT 0.2*  PROT 7.0  ALBUMIN 3.7   No results found for this basename: LIPASE:5,AMYLASE:5 in the last 168 hours No results found for this basename: AMMONIA:5 in the last 168 hours CBC:  Lab 08/16/11 0447 08/15/11 2214  WBC 8.1 9.6  NEUTROABS -- --  HGB 14.7 15.1  HCT 44.5 46.1  MCV 91.6 90.0  PLT 185 199   Cardiac Enzymes:  Lab 08/16/11 1803 08/16/11 0959 08/16/11 0149 08/15/11 2214  CKTOTAL 149 173 209 --  CKMB 4.4* 4.6* 5.4* --  CKMBINDEX -- -- -- --  TROPONINI <0.30 <0.30 <0.30 <0.30   BNP: BNP (last 3 results)  Basename 08/16/11 0447  PROBNP 43.4   CBG: No results found for this basename: GLUCAP:5 in the last 168 hours  Time coordinating discharge: greater than 35 minutes  Signed:  Tyriana Helmkamp  [facility] 08/17/2011, 9:32 AM

## 2011-08-21 DIAGNOSIS — R079 Chest pain, unspecified: Secondary | ICD-10-CM

## 2012-11-25 ENCOUNTER — Emergency Department: Payer: Self-pay | Admitting: Emergency Medicine

## 2013-03-02 ENCOUNTER — Encounter: Payer: Self-pay | Admitting: Internal Medicine

## 2013-03-02 ENCOUNTER — Ambulatory Visit (INDEPENDENT_AMBULATORY_CARE_PROVIDER_SITE_OTHER): Payer: Managed Care, Other (non HMO) | Admitting: Internal Medicine

## 2013-03-02 VITALS — BP 136/82 | HR 65 | Temp 97.5°F | Ht 68.25 in | Wt 321.8 lb

## 2013-03-02 DIAGNOSIS — K219 Gastro-esophageal reflux disease without esophagitis: Secondary | ICD-10-CM

## 2013-03-02 DIAGNOSIS — R5383 Other fatigue: Secondary | ICD-10-CM

## 2013-03-02 DIAGNOSIS — M1990 Unspecified osteoarthritis, unspecified site: Secondary | ICD-10-CM

## 2013-03-02 DIAGNOSIS — I10 Essential (primary) hypertension: Secondary | ICD-10-CM

## 2013-03-02 DIAGNOSIS — R5381 Other malaise: Secondary | ICD-10-CM

## 2013-03-02 DIAGNOSIS — M129 Arthropathy, unspecified: Secondary | ICD-10-CM

## 2013-03-02 LAB — COMPREHENSIVE METABOLIC PANEL
ALT: 35 U/L (ref 0–53)
AST: 22 U/L (ref 0–37)
Albumin: 3.5 g/dL (ref 3.5–5.2)
Alkaline Phosphatase: 66 U/L (ref 39–117)
BUN: 15 mg/dL (ref 6–23)
CO2: 28 mEq/L (ref 19–32)
Calcium: 9 mg/dL (ref 8.4–10.5)
Chloride: 105 mEq/L (ref 96–112)
Creatinine, Ser: 1.1 mg/dL (ref 0.4–1.5)
GFR: 73.93 mL/min (ref >60.00)
Glucose, Bld: 100 mg/dL — ABNORMAL HIGH (ref 70–99)
Potassium: 4.4 mEq/L (ref 3.5–5.1)
Sodium: 140 mEq/L (ref 135–145)
Total Bilirubin: 0.5 mg/dL (ref 0.3–1.2)
Total Protein: 6.9 g/dL (ref 6.0–8.3)

## 2013-03-02 LAB — CBC
HCT: 44.9 % (ref 39.0–52.0)
Hemoglobin: 14.9 g/dL (ref 13.0–17.0)
MCHC: 33.1 g/dL (ref 30.0–36.0)
MCV: 90 fl (ref 78.0–100.0)
Platelets: 199 10*3/uL (ref 150.0–400.0)
RBC: 4.99 Mil/uL (ref 4.22–5.81)
RDW: 13.5 % (ref 11.5–14.6)
WBC: 7.8 10*3/uL (ref 4.5–10.5)

## 2013-03-02 LAB — LIPID PANEL
Cholesterol: 157 mg/dL (ref 0–200)
HDL: 33.5 mg/dL — ABNORMAL LOW (ref >39.00)
LDL Cholesterol: 112 mg/dL — ABNORMAL HIGH (ref 0–99)
Total CHOL/HDL Ratio: 5
Triglycerides: 59 mg/dL (ref 0.0–149.0)
VLDL: 11.8 mg/dL (ref 0.0–40.0)

## 2013-03-02 LAB — HEMOGLOBIN A1C: Hgb A1c MFr Bld: 6 % (ref 4.6–6.5)

## 2013-03-02 LAB — TSH: TSH: 1.83 u[IU]/mL (ref 0.35–5.50)

## 2013-03-02 NOTE — Progress Notes (Signed)
Pre-visit discussion using our clinic review tool. No additional management support is needed unless otherwise documented below in the visit note.  

## 2013-03-02 NOTE — Assessment & Plan Note (Signed)
Controlled on Nexium Advised him that weight loss would be beneficial

## 2013-03-02 NOTE — Progress Notes (Signed)
HPI  Pt presents to the clinic today to establish care. He has not had a PCP in many years. His previous PCP was Dr. Hardin Negus. He does have multiple concerns today. 1- HTN: He reports that he has not been taking his blood pressure medication. He reports when he takes it makes his feel light headed and dizzy. 2- Arthritis: He c/o pain in his knees. He takes Aleve and [employer] which does not provide much relief. 3- He has concerns about his weight. He does not stick to a particular diet and he does not exercise. 4- Fatigue: He reports that he is chronically fatigued, and feels tired most of the day, even though he sleeps through the night. He does have a history of sleep apnea and does use a CPAP machine. 5- Depression: he has felt down and depressed. Likely due to being out of work for the last 6 months.   Flu: declines Tetanus: 2011 PSA Screening: never Colon Screening: never Eye Doctor: 2000 Dentist: 2007  Past Medical History  Diagnosis Date  . Obesity   . OSA (obstructive sleep apnea)   . Hypertension 08/17/2011  . Chest pain 08/16/2011    MI ruled out.  Marland Kitchen LVH (left ventricular hypertrophy) 08/17/2011    Per 2-D echocardiogram.  . GERD (gastroesophageal reflux disease) 08/17/2011  . Arthritis   . Urine incontinence     Current Outpatient Prescriptions  Medication Sig Dispense Refill  . esomeprazole (NEXIUM) 20 MG capsule Take 20 mg by mouth daily at 12 noon.      Marland Kitchen lisinopril-hydrochlorothiazide (PRINZIDE,ZESTORETIC) 20-12.5 MG per tablet Take 1 tablet by mouth daily.       No current facility-administered medications for this visit.    Allergies  Allergen Reactions  . Penicillins Other (See Comments)    REACTION: UNKNOWN    Family History  Problem Relation Age of Onset  . Coronary artery disease Mother   . Hyperlipidemia Mother   . Hypertension Mother   . Arthritis Mother   . Coronary artery disease Father     History   Social History  . Marital Status: Married     Spouse Name: N/A    Number of Children: N/A  . Years of Education: N/A   Occupational History  . tiler    Social History Main Topics  . Smoking status: Never Smoker   . Smokeless tobacco: Never Used  . Alcohol Use: Yes     Comment: occassional  . Drug Use: No  . Sexual Activity: Not on file   Other Topics Concern  . Not on file   Social History Narrative  . No narrative on file    ROS:  Constitutional: Pt reports fatigue and weight gain. Denies fever, malaise, headache.  Respiratory: Denies difficulty breathing, shortness of breath, cough or sputum production.   Cardiovascular: Denies chest pain, chest tightness, palpitations or swelling in the hands or feet.  Musculoskeletal: Pt reports bilateral knee pain. Denies difficulty with gait, muscle pain or joint pain and swelling.  Psych: Pt reports depression. Denies SI/HI  No other specific complaints in a complete review of systems (except as listed in HPI above).  PE:  BP 136/82  Pulse 65  Temp(Src) 97.5 F (36.4 C) (Oral)  Ht 5' 8.25" (1.734 m)  Wt 321 lb 12 oz (145.945 kg)  BMI 48.54 kg/m2  SpO2 97% Wt Readings from Last 3 Encounters:  03/02/13 321 lb 12 oz (145.945 kg)  08/17/11 314 lb 9.5 oz (142.7 kg)  General: Appears his stated age, obese but well developed, well nourished in NAD. Cardiovascular: Normal rate and rhythm. S1,S2 noted.  No murmur, rubs or gallops noted. No JVD or BLE edema. No carotid bruits noted. Pulmonary/Chest: Normal effort and positive vesicular breath sounds. No respiratory distress. No wheezes, rales or ronchi noted.  Musculoskeletal: Normal range of motion. No signs of joint swelling. No difficulty with gait.  Psychiatric: Mood and affect normal. Behavior is normal. Judgment and thought content normal.     BMET    Component Value Date/Time   NA 144 08/16/2011 0447   K 4.1 08/16/2011 0447   CL 108 08/16/2011 0447   CO2 29 08/16/2011 0447   GLUCOSE 122* 08/16/2011 0447   BUN  18 08/16/2011 0447   CREATININE 1.00 08/16/2011 0447   CALCIUM 9.2 08/16/2011 0447   GFRNONAA 86* 08/16/2011 0447   GFRAA >90 08/16/2011 0447    Lipid Panel     Component Value Date/Time   CHOL 154 08/16/2011 0448   TRIG 69 08/16/2011 0448   HDL 41 08/16/2011 0448   CHOLHDL 3.8 08/16/2011 0448   VLDL 14 08/16/2011 0448   LDLCALC 99 08/16/2011 0448    CBC    Component Value Date/Time   WBC 8.1 08/16/2011 0447   RBC 4.86 08/16/2011 0447   HGB 14.7 08/16/2011 0447   HCT 44.5 08/16/2011 0447   PLT 185 08/16/2011 0447   MCV 91.6 08/16/2011 0447   MCH 30.2 08/16/2011 0447   MCHC 33.0 08/16/2011 0447   RDW 13.5 08/16/2011 0447    Hgb A1C Lab Results  Component Value Date   HGBA1C 5.6 08/16/2011     Assessment and Plan:   Fatigue:  Scored 16 on depression scale Extensive counseling provided regarding diet and exercise to lose weight  Will check TSH today   Arthritis  Talked about referral to rheumo, does not want at this time Counseled on losing weight Will discuss pain management at follow up Did encourage to stop using Goody powders  Health Maintenance  Will check lipid panel, CBC, CMET, TSH, A1C Discussed in detail diet and exercise needs; work on portion control and decreasing soda intake Offered referral to GI for colonoscopy, pt states he needs to think about this Follow up in 2 weeks for physical

## 2013-03-02 NOTE — Assessment & Plan Note (Addendum)
On Lisinopril-HCT but has not been taking Lets hold BP meds today and recheck in 2 weeks

## 2013-03-02 NOTE — Patient Instructions (Signed)

## 2013-03-02 NOTE — Assessment & Plan Note (Signed)
Encouraged him to work on diet and exercise 

## 2013-03-02 NOTE — Progress Notes (Signed)
HPI: Pt presents to the office today to establish care. He is transferring from Dr. Hardin Negus. Pt has concerns regarding his blood pressure and arthritis. Pt states he has not been taking his blood pressure medication as prescribed because they make dizzy and light-headed. He also addresses concerns regarding his arthritic pain in his knees. He currently takes Aleve and Goody powders for his pain, which does not provide much relief. Pt does endorse issues with his weight. He does not pay much attention to his diet and he does not exercise.  Another concern is regarding his fatigue. He states he chronically fatigued and feels tired most of the day, even after sleeping through night. He was diagnosed with sleep apnea last July and uses a CPAP machine at night. He did admit to feeling depressed due to his deteroriation and being out of work for the last 6 months.   Colonoscopy: never, refer to GI Prostate Exam: never Flu vaccine: decline Tetanus: 2011 Eye Exam: 2000 Dentist: 2007  Past Medical History  Diagnosis Date  . Obesity   . OSA (obstructive sleep apnea)   . Hypertension 08/17/2011  . Chest pain 08/16/2011    MI ruled out.  Marland Kitchen LVH (left ventricular hypertrophy) 08/17/2011    Per 2-D echocardiogram.  . GERD (gastroesophageal reflux disease) 08/17/2011  . Arthritis   . Urine incontinence     Current Outpatient Prescriptions  Medication Sig Dispense Refill  . esomeprazole (NEXIUM) 20 MG capsule Take 20 mg by mouth daily at 12 noon.      Marland Kitchen lisinopril-hydrochlorothiazide (PRINZIDE,ZESTORETIC) 20-12.5 MG per tablet Take 1 tablet by mouth daily.       No current facility-administered medications for this visit.    Allergies  Allergen Reactions  . Penicillins Other (See Comments)    REACTION: UNKNOWN    Family History  Problem Relation Age of Onset  . Coronary artery disease Mother   . Hyperlipidemia Mother   . Hypertension Mother   . Arthritis Mother   . Coronary artery disease  Father     History   Social History  . Marital Status: Married    Spouse Name: N/A    Number of Children: N/A  . Years of Education: N/A   Occupational History  . tiler    Social History Main Topics  . Smoking status: Never Smoker   . Smokeless tobacco: Never Used  . Alcohol Use: Yes     Comment: occassional  . Drug Use: No  . Sexual Activity: Not on file   Other Topics Concern  . Not on file   Social History Narrative  . No narrative on file    ROS:  Constitutional:Endorses fatigue.  Denies fever, malaise,  headache or abrupt weight changes.  Respiratory: Denies difficulty breathing, shortness of breath, cough or sputum production.   Cardiovascular:Endorses swelling to bilateral legs.  Denies chest pain, chest tightness, or palpitations   Gastrointestinal: Denies abdominal pain, bloating, constipation, diarrhea or blood in the stool.  GU: Denies frequency, urgency, pain with urination, blood in urine, odor or discharge. Musculoskeletal: Endorses difficulty with gait, muscle pain, joint pain, and swelling.   Skin: Denies redness, rashes, lesions or ulcercations.  Neurological:Endorses dizziness.  Denies difficulty with memory, difficulty with speech or problems with balance and coordination.   No other specific complaints in a complete review of systems (except as listed in HPI above).  PE: Filed Vitals:   03/02/13 0933  BP: 136/82  Pulse: 65  Temp: 97.5 F (36.4 C)  Ht 5' 8.25" (1.734 m)  Wt 321 lb 12 oz (145.945 kg)  BMI 48.54 kg/m2 Wt Readings from Last 3 Encounters:  03/02/13 321 lb 12 oz (145.945 kg)  08/17/11 314 lb 9.5 oz (142.7 kg)    General: Appears their stated age, obese, well nourished in NAD. Cardiovascular: Normal rate and rhythm. S1,S2 noted.  No murmur, rubs or gallops noted. No JVD or BLE edema. No carotid bruits noted. Pulmonary/Chest: Normal effort and positive vesicular breath sounds. No respiratory distress. No wheezes, rales or  ronchi noted.  Musculoskeletal: Normal range of motion. No signs of joint swelling. No difficulty with gait.  Psychiatric: Mood and affect normal. Behavior is normal. Judgment and thought content normal.      Assessment and Plan: Hypertension: Will not refill Lisinopril/HCTZ today Follow up in 2 weeks to recheck  Fatigue: Scored 16 on depression scale Extensive counseling provided regarding diet and exercise to lose weight  Will check TSH today Will continue to follow  Arthritis Talked about referral to rheumo, does not want at this time Counseled on losing weight Will discuss pain management at follow up Did encourage to stop using [place] Maintenance Will check lipid panel, CBC, CMET, TSH, A1C Discussed in detail diet and exercise needs; work on portion control and decreasing soda intake Offered referral to GI for colonoscopy, pt states he needs to think about this Follow up in 2 weeks for physical  Milledge Gerding, Demetrius Charity, Student-NP

## 2013-03-16 ENCOUNTER — Ambulatory Visit (INDEPENDENT_AMBULATORY_CARE_PROVIDER_SITE_OTHER): Payer: Managed Care, Other (non HMO) | Admitting: Internal Medicine

## 2013-03-16 ENCOUNTER — Encounter: Payer: Self-pay | Admitting: Internal Medicine

## 2013-03-16 VITALS — BP 128/78 | HR 70 | Temp 97.7°F | Wt 305.0 lb

## 2013-03-16 DIAGNOSIS — N63 Unspecified lump in unspecified breast: Secondary | ICD-10-CM

## 2013-03-16 DIAGNOSIS — I10 Essential (primary) hypertension: Secondary | ICD-10-CM

## 2013-03-16 DIAGNOSIS — R609 Edema, unspecified: Secondary | ICD-10-CM

## 2013-03-16 DIAGNOSIS — M1990 Unspecified osteoarthritis, unspecified site: Secondary | ICD-10-CM

## 2013-03-16 DIAGNOSIS — N6310 Unspecified lump in the right breast, unspecified quadrant: Secondary | ICD-10-CM

## 2013-03-16 DIAGNOSIS — M129 Arthropathy, unspecified: Secondary | ICD-10-CM

## 2013-03-16 MED ORDER — HYDROCHLOROTHIAZIDE 12.5 MG PO CAPS: 12.5000 mg | ORAL_CAPSULE | Freq: Every day | ORAL | Status: DC

## 2013-03-16 NOTE — Addendum Note (Signed)
Addended by: Jearld Fenton on: 03/16/2013 09:17 AM   Modules accepted: Orders

## 2013-03-16 NOTE — Progress Notes (Signed)
Subjective:    Patient ID: Richard [name], male    DOB: 12-07-1960, 53 y.o.   MRN: [id number]  HPI  Pt presents to the clinic today for 2 week followup of HTN. He had not taken his BP medication in 2 weeks. His BP at that time looked pretty good. He returns today for BP check. He does report that he has changed his eating habits. He cut out soda, bread and is consuming low sodium diet. He has lost 15 lbs in 2 weeks. He notes a history of leg surgeries and c/o of bilateral swelling in his ankles.   Additionally, he c/o bilateral knee pain. He does have arthritis. He has had 4 knee scopes. He has tried naproxen in the past which did not provide good relief. He is taking Aleve now.  Additionally, he c/o a tender spot under his right nipple. He noticed it this week. He denies discharge from the nipple.   Review of Systems      Past Medical History  Diagnosis Date  . Obesity   . OSA (obstructive sleep apnea)   . Hypertension 08/17/2011  . Chest pain 08/16/2011    MI ruled out.  Marland Kitchen LVH (left ventricular hypertrophy) 08/17/2011    Per 2-D echocardiogram.  . GERD (gastroesophageal reflux disease) 08/17/2011  . Arthritis   . Urine incontinence     Current Outpatient Prescriptions  Medication Sig Dispense Refill  . esomeprazole (NEXIUM) 20 MG capsule Take 20 mg by mouth daily at 12 noon.      Marland Kitchen lisinopril-hydrochlorothiazide (PRINZIDE,ZESTORETIC) 20-12.5 MG per tablet Take 1 tablet by mouth daily.       No current facility-administered medications for this visit.    Allergies  Allergen Reactions  . Penicillins Other (See Comments)    REACTION: UNKNOWN    Family History  Problem Relation Age of Onset  . Coronary artery disease Mother   . Hyperlipidemia Mother   . Hypertension Mother   . Arthritis Mother   . Coronary artery disease Father     History   Social History  . Marital Status: Married    Spouse Name: N/A    Number of Children: N/A  . Years of Education: N/A    Occupational History  . tiler    Social History Main Topics  . Smoking status: Never Smoker   . Smokeless tobacco: Never Used  . Alcohol Use: Yes     Comment: occassional  . Drug Use: No  . Sexual Activity: Yes   Other Topics Concern  . Not on file   Social History Narrative  . No narrative on file     Constitutional: Denies fever, malaise, fatigue, headache or abrupt weight changes.  Respiratory: Denies difficulty breathing, shortness of breath, cough or sputum production.   Cardiovascular: Denies chest pain, chest tightness, palpitations or swelling in the hands or feet.  Musculoskeletal: Pt reports bilateral knee pain and lump behind right nipple. Denies decrease in range of motion, difficulty with gait, muscle pain or joint pain and swelling.    No other specific complaints in a complete review of systems (except as listed in HPI above).  Objective:   Physical Exam   BP 128/78  Pulse 70  Temp(Src) 97.7 F (36.5 C) (Oral)  Wt 305 lb (138.347 kg)  SpO2 97% Wt Readings from Last 3 Encounters:  03/16/13 305 lb (138.347 kg)  03/02/13 321 lb 12 oz (145.945 kg)  08/17/11 314 lb 9.5 oz (142.7 kg)  General: Appears his stated age, obese well developed, well nourished in NAD. Cardiovascular: Normal rate and rhythm. S1,S2 noted.  No murmur, rubs or gallops noted. No JVD. No carotid bruits noted. Bilateral 2+ pitting edema in both ankles.  Pulmonary/Chest: Normal effort and positive vesicular breath sounds. No respiratory distress. No wheezes, rales or ronchi noted. Thickened breast mass noted at 12 oclock just above the right nipple. Musculoskeletal: Crepitus noted with ROM of bilateral knees. No signs of joint swelling. No difficulty with gait.    BMET    Component Value Date/Time   NA 140 03/02/2013 1036   K 4.4 03/02/2013 1036   CL 105 03/02/2013 1036   CO2 28 03/02/2013 1036   GLUCOSE 100* 03/02/2013 1036   BUN 15 03/02/2013 1036   CREATININE 1.1 03/02/2013 1036    CALCIUM 9.0 03/02/2013 1036   GFRNONAA 86* 08/16/2011 0447   GFRAA >90 08/16/2011 0447    Lipid Panel     Component Value Date/Time   CHOL 157 03/02/2013 1036   TRIG 59.0 03/02/2013 1036   HDL 33.50* 03/02/2013 1036   CHOLHDL 5 03/02/2013 1036   VLDL 11.8 03/02/2013 1036   LDLCALC 112* 03/02/2013 1036    CBC    Component Value Date/Time   WBC 7.8 03/02/2013 1036   RBC 4.99 03/02/2013 1036   HGB 14.9 03/02/2013 1036   HCT 44.9 03/02/2013 1036   PLT 199.0 03/02/2013 1036   MCV 90.0 03/02/2013 1036   MCH 30.2 08/16/2011 0447   MCHC 33.1 03/02/2013 1036   RDW 13.5 03/02/2013 1036    Hgb A1C Lab Results  Component Value Date   HGBA1C 6.0 03/02/2013        Assessment & Plan:   Right breast mass:  Will order ultrasound and diagnostic mammogram

## 2013-03-16 NOTE — Assessment & Plan Note (Addendum)
Blood pressure is well controlled off medication Prescription given for hydrochlorothiazide for edema in legs Continue to work on diet and exercise

## 2013-03-16 NOTE — Patient Instructions (Addendum)
Knee Exercises EXERCISES RANGE OF MOTION(ROM) AND STRETCHING EXERCISES These exercises may help you when beginning to rehabilitate your injury. Your symptoms may resolve with or without further involvement from your physician, physical therapist or athletic trainer. While completing these exercises, remember:   Restoring tissue flexibility helps normal motion to return to the joints. This allows healthier, less painful movement and activity.  An effective stretch should be held for at least 30 seconds.  A stretch should never be painful. You should only feel a gentle lengthening or release in the stretched tissue. STRETCH - Knee Extension, Prone  Lie on your stomach on a firm surface, such as a bed or countertop. Place your right / left knee and leg just beyond the edge of the surface. You may wish to place a towel under the far end of your right / left thigh for comfort.  Relax your leg muscles and allow gravity to straighten your knee. Your clinician may advise you to add an ankle weight if more resistance is helpful for you.  You should feel a stretch in the back of your right / left knee. Hold this position for __________ seconds. Repeat __________ times. Complete this stretch __________ times per day. * Your physician, physical therapist or athletic trainer may ask you to add ankle weight to enhance your stretch.  RANGE OF MOTION - Knee Flexion, Active  Lie on your back with both knees straight. (If this causes back discomfort, bend your opposite knee, placing your foot flat on the floor.)  Slowly slide your heel back toward your buttocks until you feel a gentle stretch in the front of your knee or thigh.  Hold for __________ seconds. Slowly slide your heel back to the starting position. Repeat __________ times. Complete this exercise __________ times per day.  STRETCH - Quadriceps, Prone   Lie on your stomach on a firm surface, such as a bed or padded floor.  Bend your right /  left knee and grasp your ankle. If you are unable to reach, your ankle or pant leg, use a belt around your foot to lengthen your reach.  Gently pull your heel toward your buttocks. Your knee should not slide out to the side. You should feel a stretch in the front of your thigh and/or knee.  Hold this position for __________ seconds. Repeat __________ times. Complete this stretch __________ times per day.  STRETCH  Hamstrings, Supine   Lie on your back. Loop a belt or towel over the ball of your right / left foot.  Straighten your right / left knee and slowly pull on the belt to raise your leg. Do not allow the right / left knee to bend. Keep your opposite leg flat on the floor.  Raise the leg until you feel a gentle stretch behind your right / left knee or thigh. Hold this position for __________ seconds. Repeat __________ times. Complete this stretch __________ times per day.  STRENGTHENING EXERCISES These exercises may help you when beginning to rehabilitate your injury. They may resolve your symptoms with or without further involvement from your physician, physical therapist or athletic trainer. While completing these exercises, remember:   Muscles can gain both the endurance and the strength needed for everyday activities through controlled exercises.  Complete these exercises as instructed by your physician, physical therapist or athletic trainer. Progress the resistance and repetitions only as guided.  You may experience muscle soreness or fatigue, but the pain or discomfort you are trying to eliminate should   never worsen during these exercises. If this pain does worsen, stop and make certain you are following the directions exactly. If the pain is still present after adjustments, discontinue the exercise until you can discuss the trouble with your clinician. STRENGTH - Quadriceps, Isometrics  Lie on your back with your right / left leg extended and your opposite knee bent.  Gradually  tense the muscles in the front of your right / left thigh. You should see either your knee cap slide up toward your hip or increased dimpling just above the knee. This motion will push the back of the knee down toward the floor/mat/bed on which you are lying.  Hold the muscle as tight as you can without increasing your pain for __________ seconds.  Relax the muscles slowly and completely in between each repetition. Repeat __________ times. Complete this exercise __________ times per day.  STRENGTH - Quadriceps, Short Arcs   Lie on your back. Place a __________ inch towel roll under your knee so that the knee slightly bends.  Raise only your lower leg by tightening the muscles in the front of your thigh. Do not allow your thigh to rise.  Hold this position for __________ seconds. Repeat __________ times. Complete this exercise __________ times per day.  OPTIONAL ANKLE WEIGHTS: Begin with ____________________, but DO NOT exceed ____________________. Increase in 1 pound/0.5 kilogram increments.  STRENGTH - Quadriceps, Straight Leg Raises  Quality counts! Watch for signs that the quadriceps muscle is working to insure you are strengthening the correct muscles and not "cheating" by substituting with healthier muscles.  Lay on your back with your right / left leg extended and your opposite knee bent.  Tense the muscles in the front of your right / left thigh. You should see either your knee cap slide up or increased dimpling just above the knee. Your thigh may even quiver.  Tighten these muscles even more and raise your leg 4 to 6 inches off the floor. Hold for __________ seconds.  Keeping these muscles tense, lower your leg.  Relax the muscles slowly and completely in between each repetition. Repeat __________ times. Complete this exercise __________ times per day.  STRENGTH - Hamstring, Curls  Lay on your stomach with your legs extended. (If you lay on a bed, your feet may hang over the  edge.)  Tighten the muscles in the back of your thigh to bend your right / left knee up to 90 degrees. Keep your hips flat on the bed/floor.  Hold this position for __________ seconds.  Slowly lower your leg back to the starting position. Repeat __________ times. Complete this exercise __________ times per day.  OPTIONAL ANKLE WEIGHTS: Begin with ____________________, but DO NOT exceed ____________________. Increase in 1 pound/0.5 kilogram increments.  STRENGTH  Quadriceps, Squats  Stand in a door frame so that your feet and knees are in line with the frame.  Use your hands for balance, not support, on the frame.  Slowly lower your weight, bending at the hips and knees. Keep your lower legs upright so that they are parallel with the door frame. Squat only within the range that does not increase your knee pain. Never let your hips drop below your knees.  Slowly return upright, pushing with your legs, not pulling with your hands. Repeat __________ times. Complete this exercise __________ times per day.  STRENGTH - Quadriceps, Wall Slides  Follow guidelines for form closely. Increased knee pain often results from poorly placed feet or knees.  Lean against   a smooth wall or door and walk your feet out 18-24 inches. Place your feet hip-width apart.  Slowly slide down the wall or door until your knees bend __________ degrees.* Keep your knees over your heels, not your toes, and in line with your hips, not falling to either side.  Hold for __________ seconds. Stand up to rest for __________ seconds in between each repetition. Repeat __________ times. Complete this exercise __________ times per day. * Your physician, physical therapist or athletic trainer will alter this angle based on your symptoms and progress. Document Released: 11/21/2004 Document Revised: 04/01/2011 Document Reviewed: 04/21/2008 [employer] Patient Information 2014 [employer].  

## 2013-03-16 NOTE — Progress Notes (Signed)
Pre visit review using our clinic review tool, if applicable. No additional management support is needed unless otherwise documented below in the visit note. 

## 2013-03-16 NOTE — Assessment & Plan Note (Signed)
Bilateral knees Continue Aleve

## 2013-03-17 ENCOUNTER — Telehealth: Payer: Self-pay | Admitting: Internal Medicine

## 2013-03-17 NOTE — Telephone Encounter (Signed)
Relevant patient education mailed to patient.  

## 2013-03-24 ENCOUNTER — Ambulatory Visit: Payer: Self-pay | Admitting: Internal Medicine

## 2013-03-25 ENCOUNTER — Encounter: Payer: Self-pay | Admitting: Internal Medicine

## 2013-04-01 ENCOUNTER — Ambulatory Visit (INDEPENDENT_AMBULATORY_CARE_PROVIDER_SITE_OTHER): Payer: Managed Care, Other (non HMO) | Admitting: Internal Medicine

## 2013-04-01 ENCOUNTER — Encounter: Payer: Self-pay | Admitting: Internal Medicine

## 2013-04-01 VITALS — BP 120/70 | HR 72 | Temp 98.2°F | Wt 302.5 lb

## 2013-04-01 DIAGNOSIS — R071 Chest pain on breathing: Secondary | ICD-10-CM

## 2013-04-01 DIAGNOSIS — R0789 Other chest pain: Secondary | ICD-10-CM

## 2013-04-01 DIAGNOSIS — R079 Chest pain, unspecified: Secondary | ICD-10-CM

## 2013-04-01 DIAGNOSIS — R0781 Pleurodynia: Secondary | ICD-10-CM

## 2013-04-01 NOTE — Progress Notes (Signed)
Pre visit review using our clinic review tool, if applicable. No additional management support is needed unless otherwise documented below in the visit note. 

## 2013-04-01 NOTE — Patient Instructions (Addendum)

## 2013-04-01 NOTE — Progress Notes (Signed)
Subjective:    Patient ID: Richard [name], male    DOB: 13-Apr-1960, 53 y.o.   MRN: [id number]  HPI  Pt presents to the clinic today with c/o chest pain. He reports the pain occurs in his left chest under his rib and radiates into his back underneath his left shoulder pain. The pain is constant. It does not seem to be worse with walking. It is worse with movement and a deep breath. He denies shortness of breath, dizziness. He denies any chest wall trauma or back injury. He has taken Aleve OTC which he thinks does help. He does not smoke.  Review of Systems      Past Medical History  Diagnosis Date  . Obesity   . OSA (obstructive sleep apnea)   . Hypertension 08/17/2011  . Chest pain 08/16/2011    MI ruled out.  Marland Kitchen LVH (left ventricular hypertrophy) 08/17/2011    Per 2-D echocardiogram.  . GERD (gastroesophageal reflux disease) 08/17/2011  . Arthritis   . Urine incontinence     Current Outpatient Prescriptions  Medication Sig Dispense Refill  . esomeprazole (NEXIUM) 20 MG capsule Take 20 mg by mouth daily at 12 noon.      . hydrochlorothiazide (MICROZIDE) 12.5 MG capsule Take 1 capsule (12.5 mg total) by mouth daily.  30 capsule  2   No current facility-administered medications for this visit.    Allergies  Allergen Reactions  . Penicillins Other (See Comments)    REACTION: UNKNOWN    Family History  Problem Relation Age of Onset  . Coronary artery disease Mother   . Hyperlipidemia Mother   . Hypertension Mother   . Arthritis Mother   . Coronary artery disease Father     History   Social History  . Marital Status: Married    Spouse Name: N/A    Number of Children: N/A  . Years of Education: N/A   Occupational History  . tiler    Social History Main Topics  . Smoking status: Never Smoker   . Smokeless tobacco: Never Used  . Alcohol Use: Yes     Comment: occassional  . Drug Use: No  . Sexual Activity: Yes   Other Topics Concern  . Not on file   Social  History Narrative  . No narrative on file     Constitutional: Denies fever, malaise, fatigue, headache or abrupt weight changes.  Respiratory: Denies difficulty breathing, shortness of breath, cough or sputum production.   Cardiovascular: Pt reports chest pain. Denies chest tightness, palpitations or swelling in the hands or feet.  Gastrointestinal: Denies abdominal pain, bloating, constipation, diarrhea or blood in the stool.  Neurological: Denies dizziness, difficulty with memory, difficulty with speech or problems with balance and coordination.   No other specific complaints in a complete review of systems (except as listed in HPI above).  Objective:   Physical Exam   BP 120/70  Pulse 72  Temp(Src) 98.2 F (36.8 C) (Oral)  Wt 302 lb 8 oz (137.213 kg)  SpO2 95% Wt Readings from Last 3 Encounters:  04/01/13 302 lb 8 oz (137.213 kg)  03/16/13 305 lb (138.347 kg)  03/02/13 321 lb 12 oz (145.945 kg)    General: Appears his stated age, obese but well developed, well nourished in NAD. Cardiovascular: Normal rate and rhythm. S1,S2 noted.  No murmur, rubs or gallops noted. No JVD or BLE edema. No carotid bruits noted. Pulmonary/Chest: Normal effort and positive vesicular breath sounds. No respiratory distress. No  wheezes, rales or ronchi noted.  Musculoskeletal: Pinpoint tenderness of the ribs/musculatrue just under the left breast.      BMET    Component Value Date/Time   NA 140 03/02/2013 1036   K 4.4 03/02/2013 1036   CL 105 03/02/2013 1036   CO2 28 03/02/2013 1036   GLUCOSE 100* 03/02/2013 1036   BUN 15 03/02/2013 1036   CREATININE 1.1 03/02/2013 1036   CALCIUM 9.0 03/02/2013 1036   GFRNONAA 86* 08/16/2011 0447   GFRAA >90 08/16/2011 0447    Lipid Panel     Component Value Date/Time   CHOL 157 03/02/2013 1036   TRIG 59.0 03/02/2013 1036   HDL 33.50* 03/02/2013 1036   CHOLHDL 5 03/02/2013 1036   VLDL 11.8 03/02/2013 1036   LDLCALC 112* 03/02/2013 1036    CBC    Component  Value Date/Time   WBC 7.8 03/02/2013 1036   RBC 4.99 03/02/2013 1036   HGB 14.9 03/02/2013 1036   HCT 44.9 03/02/2013 1036   PLT 199.0 03/02/2013 1036   MCV 90.0 03/02/2013 1036   MCH 30.2 08/16/2011 0447   MCHC 33.1 03/02/2013 1036   RDW 13.5 03/02/2013 1036    Hgb A1C Lab Results  Component Value Date   HGBA1C 6.0 03/02/2013        Assessment & Plan:   Chest wall pain, likely MSK related:  ECG today to r/o ACS: negative for ACS or demand ischemia Stretching exercises given Continue to take Aleve OTC  RTC as needed or if symptoms persist or worsen

## 2013-09-08 ENCOUNTER — Ambulatory Visit (INDEPENDENT_AMBULATORY_CARE_PROVIDER_SITE_OTHER): Payer: Managed Care, Other (non HMO) | Admitting: Internal Medicine

## 2013-09-08 ENCOUNTER — Encounter: Payer: Self-pay | Admitting: Internal Medicine

## 2013-09-08 ENCOUNTER — Ambulatory Visit (INDEPENDENT_AMBULATORY_CARE_PROVIDER_SITE_OTHER)
Admission: RE | Admit: 2013-09-08 | Discharge: 2013-09-08 | Disposition: A | Discharge status: Home / Self Care | Payer: Managed Care, Other (non HMO) | Source: Ambulatory Visit | Attending: Internal Medicine | Admitting: Internal Medicine

## 2013-09-08 ENCOUNTER — Telehealth: Payer: Self-pay

## 2013-09-08 VITALS — BP 138/84 | HR 59 | Temp 97.7°F | Wt 305.0 lb

## 2013-09-08 DIAGNOSIS — M25569 Pain in unspecified knee: Secondary | ICD-10-CM

## 2013-09-08 DIAGNOSIS — M25561 Pain in right knee: Secondary | ICD-10-CM

## 2013-09-08 DIAGNOSIS — I10 Essential (primary) hypertension: Secondary | ICD-10-CM

## 2013-09-08 DIAGNOSIS — K219 Gastro-esophageal reflux disease without esophagitis: Secondary | ICD-10-CM

## 2013-09-08 DIAGNOSIS — R609 Edema, unspecified: Secondary | ICD-10-CM

## 2013-09-08 MED ORDER — TRAMADOL HCL 50 MG PO TABS: 50.0000 mg | ORAL_TABLET | Freq: Two times a day (BID) | ORAL | Status: DC | PRN

## 2013-09-08 MED ORDER — HYDROCHLOROTHIAZIDE 25 MG PO TABS: 25.0000 mg | ORAL_TABLET | Freq: Every day | ORAL | Status: DC

## 2013-09-08 NOTE — Patient Instructions (Addendum)
Knee Exercises EXERCISES RANGE OF MOTION (ROM) AND STRETCHING EXERCISES These exercises may help you when beginning to rehabilitate your injury. Your symptoms may resolve with or without further involvement from your physician, physical therapist, or athletic trainer. While completing these exercises, remember:   Restoring tissue flexibility helps normal motion to return to the joints. This allows healthier, less painful movement and activity.  An effective stretch should be held for at least 30 seconds.  A stretch should never be painful. You should only feel a gentle lengthening or release in the stretched tissue. STRETCH - Knee Extension, Prone  Lie on your stomach on a firm surface, such as a bed or countertop. Place your right / left knee and leg just beyond the edge of the surface. You may wish to place a towel under the far end of your right / left thigh for comfort.  Relax your leg muscles and allow gravity to straighten your knee. Your clinician may advise you to add an ankle weight if more resistance is helpful for you.  You should feel a stretch in the back of your right / left knee. Hold this position for __________ seconds. Repeat __________ times. Complete this stretch __________ times per day. * Your physician, physical therapist, or athletic trainer may ask you to add ankle weight to enhance your stretch.  RANGE OF MOTION - Knee Flexion, Active  Lie on your back with both knees straight. (If this causes back discomfort, bend your opposite knee, placing your foot flat on the floor.)  Slowly slide your heel back toward your buttocks until you feel a gentle stretch in the front of your knee or thigh.  Hold for __________ seconds. Slowly slide your heel back to the starting position. Repeat __________ times. Complete this exercise __________ times per day.  STRETCH - Quadriceps, Prone   Lie on your stomach on a firm surface, such as a bed or padded floor.  Bend your right /  left knee and grasp your ankle. If you are unable to reach your ankle or pant leg, use a belt around your foot to lengthen your reach.  Gently pull your heel toward your buttocks. Your knee should not slide out to the side. You should feel a stretch in the front of your thigh and/or knee.  Hold this position for __________ seconds. Repeat __________ times. Complete this stretch __________ times per day.  STRETCH - Hamstrings, Supine   Lie on your back. Loop a belt or towel over the ball of your right / left foot.  Straighten your right / left knee and slowly pull on the belt to raise your leg. Do not allow the right / left knee to bend. Keep your opposite leg flat on the floor.  Raise the leg until you feel a gentle stretch behind your right / left knee or thigh. Hold this position for __________ seconds. Repeat __________ times. Complete this stretch __________ times per day.  STRENGTHENING EXERCISES These exercises may help you when beginning to rehabilitate your injury. They may resolve your symptoms with or without further involvement from your physician, physical therapist, or athletic trainer. While completing these exercises, remember:   Muscles can gain both the endurance and the strength needed for everyday activities through controlled exercises.  Complete these exercises as instructed by your physician, physical therapist, or athletic trainer. Progress the resistance and repetitions only as guided.  You may experience muscle soreness or fatigue, but the pain or discomfort you are trying to eliminate   should never worsen during these exercises. If this pain does worsen, stop and make certain you are following the directions exactly. If the pain is still present after adjustments, discontinue the exercise until you can discuss the trouble with your clinician. STRENGTH - Quadriceps, Isometrics  Lie on your back with your right / left leg extended and your opposite knee  bent.  Gradually tense the muscles in the front of your right / left thigh. You should see either your knee cap slide up toward your hip or increased dimpling just above the knee. This motion will push the back of the knee down toward the floor/mat/bed on which you are lying.  Hold the muscle as tight as you can without increasing your pain for __________ seconds.  Relax the muscles slowly and completely in between each repetition. Repeat __________ times. Complete this exercise __________ times per day.  STRENGTH - Quadriceps, Short Arcs   Lie on your back. Place a __________ inch towel roll under your knee so that the knee slightly bends.  Raise only your lower leg by tightening the muscles in the front of your thigh. Do not allow your thigh to rise.  Hold this position for __________ seconds. Repeat __________ times. Complete this exercise __________ times per day.  OPTIONAL ANKLE WEIGHTS: Begin with ____________________, but DO NOT exceed ____________________. Increase in 1 pound/0.5 kilogram increments.  STRENGTH - Quadriceps, Straight Leg Raises  Quality counts! Watch for signs that the quadriceps muscle is working to insure you are strengthening the correct muscles and not "cheating" by substituting with healthier muscles.  Lay on your back with your right / left leg extended and your opposite knee bent.  Tense the muscles in the front of your right / left thigh. You should see either your knee cap slide up or increased dimpling just above the knee. Your thigh may even quiver.  Tighten these muscles even more and raise your leg 4 to 6 inches off the floor. Hold for __________ seconds.  Keeping these muscles tense, lower your leg.  Relax the muscles slowly and completely in between each repetition. Repeat __________ times. Complete this exercise __________ times per day.  STRENGTH - Hamstring, Curls  Lay on your stomach with your legs extended. (If you lay on a bed, your feet  may hang over the edge.)  Tighten the muscles in the back of your thigh to bend your right / left knee up to 90 degrees. Keep your hips flat on the bed/floor.  Hold this position for __________ seconds.  Slowly lower your leg back to the starting position. Repeat __________ times. Complete this exercise __________ times per day.  OPTIONAL ANKLE WEIGHTS: Begin with ____________________, but DO NOT exceed ____________________. Increase in 1 pound/0.5 kilogram increments.  STRENGTH - Quadriceps, Squats  Stand in a door frame so that your feet and knees are in line with the frame.  Use your hands for balance, not support, on the frame.  Slowly lower your weight, bending at the hips and knees. Keep your lower legs upright so that they are parallel with the door frame. Squat only within the range that does not increase your knee pain. Never let your hips drop below your knees.  Slowly return upright, pushing with your legs, not pulling with your hands. Repeat __________ times. Complete this exercise __________ times per day.  STRENGTH - Quadriceps, Wall Slides  Follow guidelines for form closely. Increased knee pain often results from poorly placed feet or knees.  Lean   against a smooth wall or door and walk your feet out 18-24 inches. Place your feet hip-width apart.  Slowly slide down the wall or door until your knees bend __________ degrees.* Keep your knees over your heels, not your toes, and in line with your hips, not falling to either side.  Hold for __________ seconds. Stand up to rest for __________ seconds in between each repetition. Repeat __________ times. Complete this exercise __________ times per day. * Your physician, physical therapist, or athletic trainer will alter this angle based on your symptoms and progress. Document Released: 11/21/2004 Document Revised: 05/24/2013 Document Reviewed: 04/21/2008 [employer] Patient Information 2015 [employer]. This information is not  intended to replace advice given to you by your health care provider. Make sure you discuss any questions you have with your health care provider.  

## 2013-09-08 NOTE — Assessment & Plan Note (Signed)
Well controlled on prilosec.

## 2013-09-08 NOTE — Assessment & Plan Note (Signed)
Well controlled Continues to have peripheral edema Will increase HCTZ to 25 mg daily

## 2013-09-08 NOTE — Progress Notes (Signed)
Subjective:    Patient ID: Richard [name], male    DOB: 10/13/1960, 54 y.o.   MRN: [id number]  HPI  Pt presents to the clinic today for 6 month followup.  HTN: He is on low dose HCTZ. BP has been well controlled. He does note some continue swelling in the lower legs.  GERD: Well controlled on Prilosec.  Obesity: Has been working on diet and exercise. Weight is stable at 305.  He also reports right knee pain. He reports this started 1 week ago. He describes the pain as a burning sensation. He denies injury to the area. He has tried Aleve without relief. He does have a history of arthritis.    Review of Systems      Past Medical History  Diagnosis Date  . Obesity   . OSA (obstructive sleep apnea)   . Hypertension 08/17/2011  . Chest pain 08/16/2011    MI ruled out.  Marland Kitchen LVH (left ventricular hypertrophy) 08/17/2011    Per 2-D echocardiogram.  . GERD (gastroesophageal reflux disease) 08/17/2011  . Arthritis   . Urine incontinence     Current Outpatient Prescriptions  Medication Sig Dispense Refill  . esomeprazole (NEXIUM) 20 MG capsule Take 20 mg by mouth daily at 12 noon.      . hydrochlorothiazide (MICROZIDE) 12.5 MG capsule Take 1 capsule (12.5 mg total) by mouth daily.  30 capsule  2   No current facility-administered medications for this visit.    Allergies  Allergen Reactions  . Penicillins Other (See Comments)    REACTION: UNKNOWN    Family History  Problem Relation Age of Onset  . Coronary artery disease Mother   . Hyperlipidemia Mother   . Hypertension Mother   . Arthritis Mother   . Coronary artery disease Father     History   Social History  . Marital Status: Married    Spouse Name: N/A    Number of Children: N/A  . Years of Education: N/A   Occupational History  . tiler    Social History Main Topics  . Smoking status: Never Smoker   . Smokeless tobacco: Never Used  . Alcohol Use: Yes     Comment: occassional  . Drug Use: No  . Sexual  Activity: Yes   Other Topics Concern  . Not on file   Social History Narrative  . No narrative on file     Constitutional: Denies fever, malaise, fatigue, headache or abrupt weight changes.  Respiratory: Denies difficulty breathing, shortness of breath, cough or sputum production.   Cardiovascular: Pt reports swelling in legs. Denies chest pain, chest tightness, palpitations.  Gastrointestinal: Denies abdominal pain, bloating, constipation, diarrhea or blood in the stool.  Musculoskeletal: Pt reports right knee pain. Denies decrease in range of motion, difficulty with gait, muscle pain.     No other specific complaints in a complete review of systems (except as listed in HPI above).  Objective:   Physical Exam   BP 138/84  Pulse 59  Temp(Src) 97.7 F (36.5 C) (Oral)  Wt 305 lb (138.347 kg)  SpO2 97% Wt Readings from Last 3 Encounters:  09/08/13 305 lb (138.347 kg)  04/01/13 302 lb 8 oz (137.213 kg)  03/16/13 305 lb (138.347 kg)    General: Appears his stated age, obese but well developed, well nourished in NAD. Cardiovascular: Normal rate and rhythm. Distant S1,S2 noted.  No murmur, rubs or gallops noted. No JVD. 1+ pitting edema of BLE.  No carotid bruits  noted. Pulmonary/Chest: Normal effort and positive vesicular breath sounds. No respiratory distress. No wheezes, rales or ronchi noted.  Abdomen: Soft and nontender. Normal bowel sounds, no bruits noted. No distention or masses noted. Liver, spleen and kidneys non palpable. Musculoskeletal:  Normal flexion and extension of the knee. Pain with palpation of the medial portion of the knee. Negative Lachman's. Negative McMurry. Ataxic gait.   BMET    Component Value Date/Time   NA 140 03/02/2013 1036   K 4.4 03/02/2013 1036   CL 105 03/02/2013 1036   CO2 28 03/02/2013 1036   GLUCOSE 100* 03/02/2013 1036   BUN 15 03/02/2013 1036   CREATININE 1.1 03/02/2013 1036   CALCIUM 9.0 03/02/2013 1036   GFRNONAA 86* 08/16/2011 0447    GFRAA >90 08/16/2011 0447    Lipid Panel     Component Value Date/Time   CHOL 157 03/02/2013 1036   TRIG 59.0 03/02/2013 1036   HDL 33.50* 03/02/2013 1036   CHOLHDL 5 03/02/2013 1036   VLDL 11.8 03/02/2013 1036   LDLCALC 112* 03/02/2013 1036    CBC    Component Value Date/Time   WBC 7.8 03/02/2013 1036   RBC 4.99 03/02/2013 1036   HGB 14.9 03/02/2013 1036   HCT 44.9 03/02/2013 1036   PLT 199.0 03/02/2013 1036   MCV 90.0 03/02/2013 1036   MCH 30.2 08/16/2011 0447   MCHC 33.1 03/02/2013 1036   RDW 13.5 03/02/2013 1036    Hgb A1C Lab Results  Component Value Date   HGBA1C 6.0 03/02/2013        Assessment & Plan:   Right knee pain:  He thinks it may be his meniscus Will obtain xray Will likely need MRI Knee exercises given RX given for tramadol

## 2013-09-08 NOTE — Progress Notes (Signed)
Pre visit review using our clinic review tool, if applicable. No additional management support is needed unless otherwise documented below in the visit note. 

## 2013-09-08 NOTE — Assessment & Plan Note (Signed)
Weight stable Continue to work on diet and exercise

## 2013-09-09 ENCOUNTER — Telehealth: Payer: Self-pay | Admitting: Internal Medicine

## 2013-09-09 NOTE — Telephone Encounter (Signed)
Pt's wife called wanting to know if you have results of pt's knee x-ray. Could you please call back. Thank you.

## 2013-09-13 ENCOUNTER — Ambulatory Visit: Payer: Managed Care, Other (non HMO) | Admitting: Internal Medicine

## 2013-09-21 NOTE — Telephone Encounter (Signed)
Opened in error

## 2013-09-29 ENCOUNTER — Other Ambulatory Visit: Payer: Self-pay | Admitting: Internal Medicine

## 2013-09-29 DIAGNOSIS — M25561 Pain in right knee: Secondary | ICD-10-CM

## 2013-10-12 ENCOUNTER — Ambulatory Visit: Payer: Self-pay | Admitting: Internal Medicine

## 2013-10-13 ENCOUNTER — Encounter: Payer: Self-pay | Admitting: Internal Medicine

## 2013-10-14 ENCOUNTER — Other Ambulatory Visit: Payer: Self-pay | Admitting: Internal Medicine

## 2013-10-14 DIAGNOSIS — M25561 Pain in right knee: Secondary | ICD-10-CM

## 2013-10-22 ENCOUNTER — Telehealth: Payer: Self-pay

## 2013-10-22 ENCOUNTER — Encounter: Payer: Self-pay | Admitting: Internal Medicine

## 2013-10-22 DIAGNOSIS — M25561 Pain in right knee: Secondary | ICD-10-CM

## 2013-10-22 MED ORDER — TRAMADOL HCL 50 MG PO TABS: 50.0000 mg | ORAL_TABLET | Freq: Two times a day (BID) | ORAL | Status: DC | PRN

## 2013-10-22 NOTE — Telephone Encounter (Signed)
Rx called in to pharmacy. 

## 2013-10-22 NOTE — Telephone Encounter (Signed)
I will refill his tramadol but nothing stronger. He see the orthopedist on Monday who can give him something stronger if he thinks it is warrented

## 2013-10-22 NOTE — Addendum Note (Signed)
Addended by: Lindalou Hose Y on: 10/22/2013 05:00 PM   Modules accepted: Orders

## 2013-10-22 NOTE — Telephone Encounter (Signed)
Pt left v/m; pt has appt to see orthopedic dr on 10/25/13; pt is out of pain med and request stronger pain med than tramadol to [facility].Please advise.

## 2013-12-17 ENCOUNTER — Encounter: Payer: Self-pay | Admitting: Interventional Cardiology

## 2013-12-17 ENCOUNTER — Ambulatory Visit (INDEPENDENT_AMBULATORY_CARE_PROVIDER_SITE_OTHER): Payer: Managed Care, Other (non HMO) | Admitting: Interventional Cardiology

## 2013-12-17 VITALS — BP 146/88 | HR 71 | Ht 69.0 in | Wt 312.0 lb

## 2013-12-17 DIAGNOSIS — Z01818 Encounter for other preprocedural examination: Secondary | ICD-10-CM

## 2013-12-17 DIAGNOSIS — G4733 Obstructive sleep apnea (adult) (pediatric): Secondary | ICD-10-CM

## 2013-12-17 DIAGNOSIS — I10 Essential (primary) hypertension: Secondary | ICD-10-CM

## 2013-12-17 DIAGNOSIS — I517 Cardiomegaly: Secondary | ICD-10-CM

## 2013-12-17 NOTE — Patient Instructions (Addendum)
Your physician recommends that you continue on your current medications as directed. Please refer to the Current Medication list given to you today.  Your physician has requested that you have a lexiscan myoview. For further information please visit [email or web address]. Please follow instruction sheet, as given.  Your physician recommends that you schedule a follow-up appointment AS NEEDED with Dr. Irish Lack.

## 2013-12-17 NOTE — Progress Notes (Signed)
Patient ID: Richard [name], male   DOB: 02/15/1960, 53 y.o.   MRN: [id number]     Patient ID: Richard [name] MRN: [id number] DOB/AGE: 07/08/1960 53 y.o.   Referring Physician : Dr. Landau; Regina Baity, NP   Reason for Consultation preoperative eval knee replacement  HPI: 53 y/o who will be needing knee surgery.  His activity is limited by his knees.  He was evaluated at [facility] in 2013. At that time, he had atypical chest pain. He was supposed to have an outpatient Myoview but this never happened. He now presents for preoperative evaluation prior to knee replacement surgery.    He has gained weight recently. He has been obese for quite some time. He uses C Pap regularly. He is still reporting fatigue.  He has occasional chest discomfort with deep breathing and coughing. His activity is limited by his knee pain. He does have exertional shortness of breath.  Echocardiogram in 2013 showed normal left ventricular function with LVH.   Current Outpatient Prescriptions  Medication Sig Dispense Refill  . esomeprazole (NEXIUM) 20 MG capsule Take 20 mg by mouth daily at 12 noon.    . hydrochlorothiazide (HYDRODIURIL) 25 MG tablet Take 1 tablet (25 mg total) by mouth daily. 30 tablet 5  . HYDROcodone-acetaminophen (NORCO/VICODIN) 5-325 MG per tablet   0  . traMADol (ULTRAM) 50 MG tablet Take 1 tablet (50 mg total) by mouth every 12 (twelve) hours as needed. 60 tablet 0   No current facility-administered medications for this visit.   Past Medical History  Diagnosis Date  . Obesity   . OSA (obstructive sleep apnea)   . Hypertension 08/17/2011  . Chest pain 08/16/2011    MI ruled out.  . LVH (left ventricular hypertrophy) 08/17/2011    Per 2-D echocardiogram.  . GERD (gastroesophageal reflux disease) 08/17/2011  . Arthritis   . Urine incontinence     Family History  Problem Relation Age of Onset  . Coronary artery disease Mother   . Hyperlipidemia Mother   . Hypertension  Mother   . Arthritis Mother   . Coronary artery disease Father     History   Social History  . Marital Status: Married    Spouse Name: N/A    Number of Children: N/A  . Years of Education: N/A   Occupational History  . tiler    Social History Main Topics  . Smoking status: Never Smoker   . Smokeless tobacco: Never Used  . Alcohol Use: Yes     Comment: occassional  . Drug Use: No  . Sexual Activity: Yes   Other Topics Concern  . Not on file   Social History Narrative  . No narrative on file    Past Surgical History  Procedure Laterality Date  . Appendectomy    . Orthopedic surgery        (Not in a hospital admission)  Review of systems complete and found to be negative unless listed above .  No nausea, vomiting.  No fever chills, No focal weakness,  No palpitations.  Physical Exam: Filed Vitals:   12/17/13 0802  BP: 146/88  Pulse: 71    Weight: (!) 312 lb (141.522 kg)  Physical exam:  [place]/AT EOMI No JVD, No carotid bruit RRR S1S2  No wheezing Soft. NT, nondistended obese Bilateral trace pretibial edema. No focal motor or sensory deficits Normal affect  Labs:   Lab Results  Component Value Date   WBC 7.8 03/02/2013   HGB   14.9 03/02/2013   HCT 44.9 03/02/2013   MCV 90.0 03/02/2013   PLT 199.0 03/02/2013   No results for input(s): NA, K, CL, CO2, BUN, CREATININE, CALCIUM, PROT, BILITOT, ALKPHOS, ALT, AST, GLUCOSE in the last 168 hours.  Invalid input(s): LABALBU Lab Results  Component Value Date   CKTOTAL 149 08/16/2011   CKMB 4.4* 08/16/2011   TROPONINI <0.30 08/16/2011    Lab Results  Component Value Date   CHOL 157 03/02/2013   CHOL 154 08/16/2011   Lab Results  Component Value Date   HDL 33.50* 03/02/2013   HDL 41 08/16/2011   Lab Results  Component Value Date   LDLCALC 112* 03/02/2013   LDLCALC 99 08/16/2011   Lab Results  Component Value Date   TRIG 59.0 03/02/2013   TRIG 69 08/16/2011   Lab Results  Component Value Date    CHOLHDL 5 03/02/2013   CHOLHDL 3.8 08/16/2011   No results found for: LDLDIRECT    Radiology: As above EKG: Normal sinus rhythm, left anterior hemiblock, no ST segment changes  ASSESSMENT AND PLAN:  1) preoperative evaluation: He is unable to achieve 4 METS of activity due to his knees. We'll plan for 2 day pharmacologic stress test. This was supposed to have been done in 2013 but was never completed. The patient is agreeable. If this is abnormal, may need to consider angiogram. He does have a family history of heart disease but it tends to occur to his family members when they are later in life.  2) obesity: He needs to work on losing weight. This will help his knees and his fatigue and breathing. It will also help his sleep apnea.  3) shortness of breath: I think this is likely multifactorial related to deconditioning and obesity.  He likely has chronic diastolic heart failure based on his LVH noted on echocardiogram. I personally reviewed his old records.  4) hypertension: This is been better controlled outside of the doctor's office. He should continue to check at home or at the drug store. Continue diuretic as dosed. Signed:   Jay S. Season Astacio, MD, FACC 12/17/2013, 8:34 AM  

## 2013-12-30 ENCOUNTER — Other Ambulatory Visit: Payer: Self-pay | Admitting: *Deleted

## 2013-12-30 ENCOUNTER — Other Ambulatory Visit (INDEPENDENT_AMBULATORY_CARE_PROVIDER_SITE_OTHER): Payer: Managed Care, Other (non HMO) | Admitting: *Deleted

## 2013-12-30 ENCOUNTER — Encounter (HOSPITAL_COMMUNITY): Payer: Self-pay | Admitting: Radiology

## 2013-12-30 ENCOUNTER — Ambulatory Visit (HOSPITAL_COMMUNITY): Payer: Managed Care, Other (non HMO) | Attending: Cardiology | Admitting: Radiology

## 2013-12-30 DIAGNOSIS — I4891 Unspecified atrial fibrillation: Secondary | ICD-10-CM

## 2013-12-30 DIAGNOSIS — R9431 Abnormal electrocardiogram [ECG] [EKG]: Secondary | ICD-10-CM

## 2013-12-30 DIAGNOSIS — I480 Paroxysmal atrial fibrillation: Secondary | ICD-10-CM

## 2013-12-30 DIAGNOSIS — I10 Essential (primary) hypertension: Secondary | ICD-10-CM | POA: Diagnosis not present

## 2013-12-30 DIAGNOSIS — Z01818 Encounter for other preprocedural examination: Secondary | ICD-10-CM

## 2013-12-30 LAB — BASIC METABOLIC PANEL
BUN: 17 mg/dL (ref 6–23)
CO2: 26 mEq/L (ref 19–32)
Calcium: 8.9 mg/dL (ref 8.4–10.5)
Chloride: 101 mEq/L (ref 96–112)
Creatinine, Ser: 1.2 mg/dL (ref 0.4–1.5)
GFR: 70.04 mL/min (ref >60.00)
Glucose, Bld: 126 mg/dL — ABNORMAL HIGH (ref 70–99)
Potassium: 4.2 mEq/L (ref 3.5–5.1)
Sodium: 134 mEq/L — ABNORMAL LOW (ref 135–145)

## 2013-12-30 LAB — CBC
HCT: 46.8 % (ref 39.0–52.0)
Hemoglobin: 15.6 g/dL (ref 13.0–17.0)
MCHC: 33.4 g/dL (ref 30.0–36.0)
MCV: 87.3 fl (ref 78.0–100.0)
Platelets: 210 10*3/uL (ref 150.0–400.0)
RBC: 5.36 Mil/uL (ref 4.22–5.81)
RDW: 13.6 % (ref 11.5–15.5)
WBC: 11 10*3/uL — ABNORMAL HIGH (ref 4.0–10.5)

## 2013-12-30 LAB — TSH: TSH: 1.53 u[IU]/mL (ref 0.35–4.50)

## 2013-12-30 MED ORDER — ASPIRIN EC 325 MG PO TBEC: 325.0000 mg | DELAYED_RELEASE_TABLET | Freq: Every day | ORAL | Status: DC

## 2013-12-30 MED ORDER — AMINOPHYLLINE 25 MG/ML IV SOLN
75.0000 mg | Freq: Once | INTRAVENOUS | Status: AC
  Administered 2013-12-30: 75 mg via INTRAVENOUS

## 2013-12-30 MED ORDER — REGADENOSON 0.4 MG/5ML IV SOLN
0.4000 mg | Freq: Once | INTRAVENOUS | Status: AC
  Administered 2013-12-30: 0.4 mg via INTRAVENOUS

## 2013-12-30 MED ORDER — METOPROLOL SUCCINATE ER 25 MG PO TB24: 12.5000 mg | ORAL_TABLET | Freq: Two times a day (BID) | ORAL | Status: DC

## 2013-12-30 MED ORDER — TECHNETIUM TC 99M SESTAMIBI GENERIC - CARDIOLITE
33.0000 | Freq: Once | INTRAVENOUS | Status: AC | PRN
  Administered 2013-12-30: 33 via INTRAVENOUS

## 2013-12-30 NOTE — Progress Notes (Signed)
[address] [place], [place] [address] [phone]    Cardiology Nuclear Med Study  Richard [name] is a 53 y.o. male     MRN : [id number]     DOB: 04/19/1960  Procedure Date: 12/30/2013  Nuclear Med Background Indication for Stress Test:  Evaluation for Ischemia and Surgical Clearance - Knee surgery Dr. Marchia Bond History:  No Cardiac History Cardiac Risk Factors: Hypertension  Symptoms:  DOE   Nuclear Pre-Procedure Caffeine/Decaff Intake:  None NPO After: 7:00pm   Lungs:  clear O2 Sat: 92% on room air. IV 0.9% NS with Angio Cath:  22g  IV Site: R Hand  IV Started by:  Crissie Figures, RN  Chest Size (in):  50+ Cup Size: n/a  Height: 5\' 9"  (1.753 m)  Weight:  310 lb (140.615 kg)  BMI:  Body mass index is 45.76 kg/(m^2). Tech Comments:  This patient found to be with new onset AFIB  RVR. DOD P.ROSS MD consulted. She advised it was ok to do the Lexiscan injection. Aminophylline given for symptoms. All were resolved before leaving.    Nuclear Med Study 1 or 2 day study: 2 day  Stress Test Type:  Lexiscan  Reading MD: n/a  Order Authorizing Provider:  Kathreen Cosier  Resting Radionuclide: Technetium 4m Sestamibi  Resting Radionuclide Dose: 33.0 mCi on 01/06/14   Stress Radionuclide:  Technetium 63m Sestamibi  Stress Radionuclide Dose: 33.0 mCi on 12/30/13           Stress Protocol Rest HR: 148 Stress HR: 157  Rest BP: 109/82 Stress BP: 115/86  Exercise Time (min): n/a METS: n/a   Predicted Max HR: 168 bpm % Max HR: 93.45 bpm Rate Pressure Product: 18055   Dose of Adenosine (mg):  n/a Dose of Lexiscan: 0.4 mg  Dose of Atropine (mg): n/a Dose of Dobutamine: n/a mcg/kg/min (at max HR)  Stress Test Technologist: Perrin Maltese, EMT-P  Nuclear Technologist:  Earl Many, CNMT     Rest Procedure:  Myocardial perfusion imaging was performed at rest 45 minutes following the intravenous administration of Technetium 2m  Sestamibi. Rest ECG: Atrial Fibrilliation  Stress Procedure:  The patient received IV Lexiscan 0.4 mg over 15-seconds.  Technetium 83m Sestamibi injected at 30-seconds. This patient had a hot flash, was sob, and was dizzy with the Lexiscan injection. Quantitative spect images were obtained after a 45 minute delay. Stress ECG: No significant change from baseline ECG  QPS Raw Data Images:  Mild diaphragmatic attenuation.  Normal left ventricular size. Stress Images:  Basal inferolateral perfusion defect Rest Images:  Normal homogeneous uptake in all areas of the myocardium. Subtraction (SDS):  Mild reversibility Transient Ischemic Dilatation (Normal <1.22):  0.70 Lung/Heart Ratio (Normal <0.45):  0.29  Quantitative Gated Spect Images QGS EDV:  n/a QGS ESV:  n/a  Impression Exercise Capacity:  Lexiscan with no exercise. BP Response:  Normal blood pressure response. Clinical Symptoms:  No significant symptoms noted. ECG Impression:  No significant ECG changes with Lexiscan. Comparison with Prior Nuclear Study: No previous nuclear study performed  Overall Impression:  Intermediate risk stress nuclear study with small area of mild, basal inferior and lateral reversible ischemia.  LV Ejection Fraction: Study not gated.  LV Wall Motion:  Study not gated   Pixie Casino, MD, [facility] Board Certified in Nuclear Cardiology Attending Cardiologist [place]

## 2013-12-30 NOTE — Progress Notes (Signed)
Patient ID: Richard [name], male   DOB: [birth year], 53 y.o.   MRN: [id number]   [name] came in today for a 2-day protocol Myocardial Perfusion Study. In preparing him for his Stress Test, he was found to be in new onset Atrial Fibrillation. Perrin Maltese spoke with Dr. Harrington Challenger, DOD, who stated it was okay to follow through with the procedure. Her other orders, which were input by her nurse, were for labs(BMET, CBC, TSH) which were done today; Echocardiogram and Holter Monitor, which were both scheduled for 01/06/14, as well as the resting portion of the MPS. Also, the patient was instructed to d/c his HCTZ, start Aspirin(EC) 325 mg once daily, and a prescription was sent to his pharmacy for Toprol XL 25 mg, 1/2 tab(12.5 mg) two times per day. Patient expressed understanding of these changes.   Evalina Field, RT-N

## 2014-01-06 ENCOUNTER — Ambulatory Visit (HOSPITAL_COMMUNITY): Payer: Managed Care, Other (non HMO) | Attending: Cardiology

## 2014-01-06 ENCOUNTER — Encounter: Payer: Self-pay | Admitting: Radiology

## 2014-01-06 ENCOUNTER — Encounter (INDEPENDENT_AMBULATORY_CARE_PROVIDER_SITE_OTHER): Payer: Managed Care, Other (non HMO)

## 2014-01-06 ENCOUNTER — Ambulatory Visit (HOSPITAL_BASED_OUTPATIENT_CLINIC_OR_DEPARTMENT_OTHER): Payer: Managed Care, Other (non HMO)

## 2014-01-06 ENCOUNTER — Other Ambulatory Visit (HOSPITAL_COMMUNITY): Payer: Managed Care, Other (non HMO) | Admitting: Cardiology

## 2014-01-06 DIAGNOSIS — G4733 Obstructive sleep apnea (adult) (pediatric): Secondary | ICD-10-CM | POA: Insufficient documentation

## 2014-01-06 DIAGNOSIS — I4891 Unspecified atrial fibrillation: Secondary | ICD-10-CM | POA: Insufficient documentation

## 2014-01-06 DIAGNOSIS — M1990 Unspecified osteoarthritis, unspecified site: Secondary | ICD-10-CM | POA: Insufficient documentation

## 2014-01-06 DIAGNOSIS — I480 Paroxysmal atrial fibrillation: Secondary | ICD-10-CM

## 2014-01-06 DIAGNOSIS — I10 Essential (primary) hypertension: Secondary | ICD-10-CM | POA: Insufficient documentation

## 2014-01-06 DIAGNOSIS — I517 Cardiomegaly: Secondary | ICD-10-CM | POA: Diagnosis not present

## 2014-01-06 DIAGNOSIS — K219 Gastro-esophageal reflux disease without esophagitis: Secondary | ICD-10-CM | POA: Diagnosis not present

## 2014-01-06 DIAGNOSIS — I351 Nonrheumatic aortic (valve) insufficiency: Secondary | ICD-10-CM | POA: Diagnosis not present

## 2014-01-06 DIAGNOSIS — R0989 Other specified symptoms and signs involving the circulatory and respiratory systems: Secondary | ICD-10-CM

## 2014-01-06 MED ORDER — TECHNETIUM TC 99M SESTAMIBI GENERIC - CARDIOLITE
33.0000 | Freq: Once | INTRAVENOUS | Status: AC | PRN
  Administered 2014-01-06: 33 via INTRAVENOUS

## 2014-01-06 MED ORDER — PERFLUTREN LIPID MICROSPHERE
1.0000 mL | Freq: Once | INTRAVENOUS | Status: AC
  Administered 2014-01-06: 1 mL via INTRAVENOUS

## 2014-01-06 NOTE — Progress Notes (Signed)
[facility] 24hr holter applied

## 2014-01-06 NOTE — Progress Notes (Signed)
2D Echo with Definity completed. 01/06/2014

## 2014-01-11 ENCOUNTER — Telehealth: Payer: Self-pay | Admitting: Interventional Cardiology

## 2014-01-11 ENCOUNTER — Other Ambulatory Visit (INDEPENDENT_AMBULATORY_CARE_PROVIDER_SITE_OTHER): Payer: Managed Care, Other (non HMO) | Admitting: *Deleted

## 2014-01-11 ENCOUNTER — Encounter: Payer: Self-pay | Admitting: *Deleted

## 2014-01-11 DIAGNOSIS — R9439 Abnormal result of other cardiovascular function study: Secondary | ICD-10-CM

## 2014-01-11 DIAGNOSIS — R931 Abnormal findings on diagnostic imaging of heart and coronary circulation: Secondary | ICD-10-CM

## 2014-01-11 LAB — BASIC METABOLIC PANEL
BUN: 14 mg/dL (ref 6–23)
CO2: 28 mEq/L (ref 19–32)
Calcium: 8.7 mg/dL (ref 8.4–10.5)
Chloride: 104 mEq/L (ref 96–112)
Creatinine, Ser: 1.1 mg/dL (ref 0.4–1.5)
GFR: 75.24 mL/min (ref >60.00)
Glucose, Bld: 121 mg/dL — ABNORMAL HIGH (ref 70–99)
Potassium: 3.8 mEq/L (ref 3.5–5.1)
Sodium: 138 mEq/L (ref 135–145)

## 2014-01-11 LAB — CBC WITH DIFFERENTIAL/PLATELET
Basophils Absolute: 0.1 10*3/uL (ref 0.0–0.1)
Basophils Relative: 0.7 % (ref 0.0–3.0)
Eosinophils Absolute: 0.3 10*3/uL (ref 0.0–0.7)
Eosinophils Relative: 3.6 % (ref 0.0–5.0)
HCT: 46.6 % (ref 39.0–52.0)
Hemoglobin: 15.2 g/dL (ref 13.0–17.0)
Lymphocytes Relative: 27 % (ref 12.0–46.0)
Lymphs Abs: 2.1 10*3/uL (ref 0.7–4.0)
MCHC: 32.6 g/dL (ref 30.0–36.0)
MCV: 89.2 fl (ref 78.0–100.0)
Monocytes Absolute: 0.7 10*3/uL (ref 0.1–1.0)
Monocytes Relative: 9 % (ref 3.0–12.0)
Neutro Abs: 4.6 10*3/uL (ref 1.4–7.7)
Neutrophils Relative %: 59.7 % (ref 43.0–77.0)
Platelets: 205 10*3/uL (ref 150.0–400.0)
RBC: 5.22 Mil/uL (ref 4.22–5.81)
RDW: 13.7 % (ref 11.5–15.5)
WBC: 7.8 10*3/uL (ref 4.0–10.5)

## 2014-01-11 LAB — PROTIME-INR
INR: 1.1 ratio — ABNORMAL HIGH (ref 0.8–1.0)
Prothrombin Time: 12.1 s (ref 9.6–13.1)

## 2014-01-11 NOTE — Telephone Encounter (Signed)
Message received from Dr. Irish Lack that he spoke with the patient on 01/10/14 and gave him his echo and myoview results. He has an abnormal myoview and will need a cath- per Dr. Irish Lack ok to schedule for Wednesday 01/12/14. This is set up for 8:30 am tomorrow. I have spoken with the patient and he is aware. He will come today for labs and I will go over his instructions with him when he gets here. He voices understanding.

## 2014-01-12 ENCOUNTER — Encounter (HOSPITAL_COMMUNITY): Payer: Self-pay | Admitting: Interventional Cardiology

## 2014-01-12 ENCOUNTER — Ambulatory Visit (HOSPITAL_COMMUNITY)
Admission: RE | Admit: 2014-01-12 | Discharge: 2014-01-12 | Disposition: A | Discharge status: Home / Self Care | Payer: Managed Care, Other (non HMO) | Source: Ambulatory Visit | Attending: Interventional Cardiology | Admitting: Interventional Cardiology

## 2014-01-12 ENCOUNTER — Encounter (HOSPITAL_COMMUNITY)
Admission: RE | Disposition: A | Discharge status: Home / Self Care | Payer: Self-pay | Source: Ambulatory Visit | Attending: Interventional Cardiology

## 2014-01-12 DIAGNOSIS — K219 Gastro-esophageal reflux disease without esophagitis: Secondary | ICD-10-CM | POA: Diagnosis not present

## 2014-01-12 DIAGNOSIS — R5381 Other malaise: Secondary | ICD-10-CM | POA: Insufficient documentation

## 2014-01-12 DIAGNOSIS — I10 Essential (primary) hypertension: Secondary | ICD-10-CM | POA: Insufficient documentation

## 2014-01-12 DIAGNOSIS — E669 Obesity, unspecified: Secondary | ICD-10-CM | POA: Insufficient documentation

## 2014-01-12 DIAGNOSIS — R0602 Shortness of breath: Secondary | ICD-10-CM | POA: Diagnosis not present

## 2014-01-12 DIAGNOSIS — I2510 Atherosclerotic heart disease of native coronary artery without angina pectoris: Secondary | ICD-10-CM | POA: Diagnosis not present

## 2014-01-12 DIAGNOSIS — I5032 Chronic diastolic (congestive) heart failure: Secondary | ICD-10-CM | POA: Insufficient documentation

## 2014-01-12 DIAGNOSIS — R9439 Abnormal result of other cardiovascular function study: Secondary | ICD-10-CM | POA: Diagnosis not present

## 2014-01-12 DIAGNOSIS — R931 Abnormal findings on diagnostic imaging of heart and coronary circulation: Secondary | ICD-10-CM

## 2014-01-12 HISTORY — PX: LEFT HEART CATHETERIZATION WITH CORONARY ANGIOGRAM: SHX5451

## 2014-01-12 SURGERY — LEFT HEART CATHETERIZATION WITH CORONARY ANGIOGRAM
Anesthesia: LOCAL

## 2014-01-12 MED ORDER — FENTANYL CITRATE 0.05 MG/ML IJ SOLN
INTRAMUSCULAR | Status: AC
  Filled 2014-01-12: qty 2

## 2014-01-12 MED ORDER — HEPARIN SODIUM (PORCINE) 1000 UNIT/ML IJ SOLN
INTRAMUSCULAR | Status: AC
  Filled 2014-01-12: qty 1

## 2014-01-12 MED ORDER — ASPIRIN EC 81 MG PO TBEC: 81.0000 mg | DELAYED_RELEASE_TABLET | Freq: Every day | ORAL | Status: DC

## 2014-01-12 MED ORDER — MIDAZOLAM HCL 2 MG/2ML IJ SOLN
INTRAMUSCULAR | Status: AC
  Filled 2014-01-12: qty 2

## 2014-01-12 MED ORDER — SODIUM CHLORIDE 0.9 % IV SOLN
INTRAVENOUS | Status: DC
  Administered 2014-01-12: 07:00:00 via INTRAVENOUS

## 2014-01-12 MED ORDER — SODIUM CHLORIDE 0.9 % IV SOLN: 250.0000 mL | INTRAVENOUS | Status: DC | PRN

## 2014-01-12 MED ORDER — SODIUM CHLORIDE 0.9 % IJ SOLN: 3.0000 mL | Freq: Two times a day (BID) | INTRAMUSCULAR | Status: DC

## 2014-01-12 MED ORDER — ASPIRIN 81 MG PO CHEW
CHEWABLE_TABLET | ORAL | Status: AC
  Filled 2014-01-12: qty 1

## 2014-01-12 MED ORDER — SODIUM CHLORIDE 0.9 % IV SOLN: 1.0000 mL/kg/h | INTRAVENOUS | Status: DC

## 2014-01-12 MED ORDER — NITROGLYCERIN 1 MG/10 ML FOR IR/CATH LAB
INTRA_ARTERIAL | Status: AC
  Filled 2014-01-12: qty 10

## 2014-01-12 MED ORDER — SODIUM CHLORIDE 0.9 % IJ SOLN: 3.0000 mL | INTRAMUSCULAR | Status: DC | PRN

## 2014-01-12 MED ORDER — VERAPAMIL HCL 2.5 MG/ML IV SOLN
INTRAVENOUS | Status: AC
  Filled 2014-01-12: qty 2

## 2014-01-12 MED ORDER — ASPIRIN 81 MG PO CHEW
81.0000 mg | CHEWABLE_TABLET | ORAL | Status: AC
  Administered 2014-01-12: 81 mg via ORAL

## 2014-01-12 MED ORDER — LIDOCAINE HCL (PF) 1 % IJ SOLN
INTRAMUSCULAR | Status: AC
  Filled 2014-01-12: qty 30

## 2014-01-12 MED ORDER — HEPARIN (PORCINE) IN NACL 2-0.9 UNIT/ML-% IJ SOLN
INTRAMUSCULAR | Status: AC
  Filled 2014-01-12: qty 1500

## 2014-01-12 NOTE — H&P (View-Only) (Signed)
Patient ID: Richard [name], male   DOB: 08/12/1960, 53 y.o.   MRN: [id number]     Patient ID: Richard [name] MRN: [id number] DOB/AGE: 46/27/1962 53 y.o.   Referring Physician : Dr. Mardelle Matte; Webb Silversmith, NP   Reason for Consultation preoperative eval knee replacement  HPI: 53 y/o who will be needing knee surgery.  His activity is limited by his knees.  He was evaluated at [facility] in 2013. At that time, he had atypical chest pain. He was supposed to have an outpatient Myoview but this never happened. He now presents for preoperative evaluation prior to knee replacement surgery.    He has gained weight recently. He has been obese for quite some time. He uses C Pap regularly. He is still reporting fatigue.  He has occasional chest discomfort with deep breathing and coughing. His activity is limited by his knee pain. He does have exertional shortness of breath.  Echocardiogram in 2013 showed normal left ventricular function with LVH.   Current Outpatient Prescriptions  Medication Sig Dispense Refill  . esomeprazole (NEXIUM) 20 MG capsule Take 20 mg by mouth daily at 12 noon.    . hydrochlorothiazide (HYDRODIURIL) 25 MG tablet Take 1 tablet (25 mg total) by mouth daily. 30 tablet 5  . HYDROcodone-acetaminophen (NORCO/VICODIN) 5-325 MG per tablet   0  . traMADol (ULTRAM) 50 MG tablet Take 1 tablet (50 mg total) by mouth every 12 (twelve) hours as needed. 60 tablet 0   No current facility-administered medications for this visit.   Past Medical History  Diagnosis Date  . Obesity   . OSA (obstructive sleep apnea)   . Hypertension 08/17/2011  . Chest pain 08/16/2011    MI ruled out.  Marland Kitchen LVH (left ventricular hypertrophy) 08/17/2011    Per 2-D echocardiogram.  . GERD (gastroesophageal reflux disease) 08/17/2011  . Arthritis   . Urine incontinence     Family History  Problem Relation Age of Onset  . Coronary artery disease Mother   . Hyperlipidemia Mother   . Hypertension  Mother   . Arthritis Mother   . Coronary artery disease Father     History   Social History  . Marital Status: Married    Spouse Name: N/A    Number of Children: N/A  . Years of Education: N/A   Occupational History  . tiler    Social History Main Topics  . Smoking status: Never Smoker   . Smokeless tobacco: Never Used  . Alcohol Use: Yes     Comment: occassional  . Drug Use: No  . Sexual Activity: Yes   Other Topics Concern  . Not on file   Social History Narrative  . No narrative on file    Past Surgical History  Procedure Laterality Date  . Appendectomy    . Orthopedic surgery        (Not in a hospital admission)  Review of systems complete and found to be negative unless listed above .  No nausea, vomiting.  No fever chills, No focal weakness,  No palpitations.  Physical Exam: Filed Vitals:   12/17/13 0802  BP: 146/88  Pulse: 71    Weight: (!) 312 lb (141.522 kg)  Physical exam:  [place]/AT EOMI No JVD, No carotid bruit RRR S1S2  No wheezing Soft. NT, nondistended obese Bilateral trace pretibial edema. No focal motor or sensory deficits Normal affect  Labs:   Lab Results  Component Value Date   WBC 7.8 03/02/2013   HGB  14.9 03/02/2013   HCT 44.9 03/02/2013   MCV 90.0 03/02/2013   PLT 199.0 03/02/2013   No results for input(s): NA, K, CL, CO2, BUN, CREATININE, CALCIUM, PROT, BILITOT, ALKPHOS, ALT, AST, GLUCOSE in the last 168 hours.  Invalid input(s): LABALBU Lab Results  Component Value Date   CKTOTAL 149 08/16/2011   CKMB 4.4* 08/16/2011   TROPONINI <0.30 08/16/2011    Lab Results  Component Value Date   CHOL 157 03/02/2013   CHOL 154 08/16/2011   Lab Results  Component Value Date   HDL 33.50* 03/02/2013   HDL 41 08/16/2011   Lab Results  Component Value Date   LDLCALC 112* 03/02/2013   LDLCALC 99 08/16/2011   Lab Results  Component Value Date   TRIG 59.0 03/02/2013   TRIG 69 08/16/2011   Lab Results  Component Value Date    CHOLHDL 5 03/02/2013   CHOLHDL 3.8 08/16/2011   No results found for: LDLDIRECT    Radiology: As above EKG: Normal sinus rhythm, left anterior hemiblock, no ST segment changes  ASSESSMENT AND PLAN:  1) preoperative evaluation: He is unable to achieve 4 METS of activity due to his knees. We'll plan for 2 day pharmacologic stress test. This was supposed to have been done in 2013 but was never completed. The patient is agreeable. If this is abnormal, may need to consider angiogram. He does have a family history of heart disease but it tends to occur to his family members when they are later in life.  2) obesity: He needs to work on losing weight. This will help his knees and his fatigue and breathing. It will also help his sleep apnea.  3) shortness of breath: I think this is likely multifactorial related to deconditioning and obesity.  He likely has chronic diastolic heart failure based on his LVH noted on echocardiogram. I personally reviewed his old records.  4) hypertension: This is been better controlled outside of the doctor's office. He should continue to check at home or at the drug store. Continue diuretic as dosed. Signed:   Mina Marble, MD, [facility] 12/17/2013, 8:34 AM

## 2014-01-12 NOTE — Progress Notes (Signed)
AUC  Ischemic Symptoms? CCS III (Marked limitation of ordinary activity) Anti-ischemic Medical Therapy? Minimal Therapy (1 class of medications) Non-invasive Test Results? Intermediate-risk stress test findings: cardiac mortality 1-3%/year Prior CABG? No Previous CABG   Patient Information:   1-2V CAD, no prox LAD  U (6)  Indication: 16; Score: 6   Patient Information:   CTO of 1 vessel, no other CAD  U (6)  Indication: 26; Score: 6   Patient Information:   1V CAD with prox LAD  A (7)  Indication: 32; Score: 7   Patient Information:   2V-CAD with prox LAD  A (8)  Indication: 38; Score: 8   Patient Information:   3V-CAD without LMCA  A (8)  Indication: 44; Score: 8   Patient Information:   3V-CAD without LMCA With Abnormal LV systolic function  A (9)  Indication: 48; Score: 9   Patient Information:   LMCA-CAD  A (9)  Indication: 49; Score: 9   Patient Information:   2V-CAD with prox LAD PCI  A (7)  Indication: 62; Score: 7   Patient Information:   2V-CAD with prox LAD CABG  A (8)  Indication: 62; Score: 8   Patient Information:   3V-CAD without LMCA With Low CAD burden(i.e., 3 focal stenoses, low SYNTAX score) PCI  A (7)  Indication: 63; Score: 7   Patient Information:   3V-CAD without LMCA With Low CAD burden(i.e., 3 focal stenoses, low SYNTAX score) CABG  A (9)  Indication: 63; Score: 9   Patient Information:   3V-CAD without LMCA E06c - Intermediate-high CAD burden (i.e., multiple diffuse lesions, presence of CTO, or high SYNTAX score) PCI  U (4)  Indication: 64; Score: 4   Patient Information:   3V-CAD without LMCA E06c - Intermediate-high CAD burden (i.e., multiple diffuse lesions, presence of CTO, or high SYNTAX score) CABG  A (9)  Indication: 64; Score: 9   Patient Information:   LMCA-CAD With Isolated LMCA stenosis  PCI  U (6)  Indication: 65; Score: 6   Patient Information:    LMCA-CAD Additional CAD, low CAD burden (i.e., 1- to 2-vessel additional involvement, low SYNTAX score) PCI  U (5)  Indication: 66; Score: 5   Patient Information:   LMCA-CAD Additional CAD, low CAD burden (i.e., 1- to 2-vessel additional involvement, low SYNTAX score) CABG  A (9)  Indication: 66; Score: 9   Patient Information:   LMCA-CAD With Isolated LMCA stenosis  CABG  A (9)  Indication: 66; Score: 9   Patient Information:   LMCA-CAD Additional CAD, intermediate-high CAD burden (i.e., 3-vessel involvement, presence of CTO, or high SYNTAX score) PCI  I (3)  Indication: 67; Score: 3   Patient Information:   LMCA-CAD Additional CAD, intermediate-high CAD burden (i.e., 3-vessel involvement, presence of CTO, or high SYNTAX score) CABG  A (9)  Indication: 67; Score: 9

## 2014-01-12 NOTE — Discharge Instructions (Signed)
Return To Work __[name]____ was treated at our facility today, 01/12/2014.  He was required to have a responsible adult to drive him home.  His wife, Richard [name], was with                     Him during procedure and available to provide transportation home. 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 Your employer should be aware of your condition and can help with the necessary work activity restrictions. If you wish to return to work sooner than the date above, or if you have further problems which make it difficult for you to return at that time, please call us or your caregiver. __Dr. Lenna Sciara Varanasi_____ Physician Name (Printed) _________________________________________ Physician Signature  _12/23/2015_________ Date Document Released: 01/07/2005 Document Revised: 04/01/2011 Document Reviewed: 06/24/2006 ExitCare Patient Information 2015 [place], [place]. This information is not intended to replace advice given to you by your health care provider. Make sure you discuss any questions you have with your health care provider.

## 2014-01-12 NOTE — Interval H&P Note (Signed)
Cath Lab Visit (complete for each Cath Lab visit)  Clinical Evaluation Leading to the Procedure:   ACS: No.  Non-ACS:    Anginal Classification: CCS III  Anti-ischemic medical therapy: Minimal Therapy (1 class of medications)  Non-Invasive Test Results: Intermediate-risk stress test findings: cardiac mortality 1-3%/year  Prior CABG: No previous CABG      History and Physical Interval Note:  01/12/2014 8:45 AM  Richard [name]  has presented today for surgery, with the diagnosis of abn stress  The various methods of treatment have been discussed with the patient and family. After consideration of risks, benefits and other options for treatment, the patient has consented to  Procedure(s): LEFT HEART CATHETERIZATION WITH CORONARY ANGIOGRAM (N/A) as a surgical intervention .  The patient's history has been reviewed, patient examined, no change in status, stable for surgery.  I have reviewed the patient's chart and labs.  Questions were answered to the patient's satisfaction.     Richard Brooking S.

## 2014-01-12 NOTE — CV Procedure (Signed)
       PROCEDURE:  Left heart catheterization with selective coronary angiography, left ventriculogram.  INDICATIONS:  Abnormal stress test  The risks, benefits, and details of the procedure were explained to the patient.  The patient verbalized understanding and wanted to proceed.  Informed written consent was obtained.  PROCEDURE TECHNIQUE:  After Xylocaine anesthesia a 41F slender sheath was placed in the right radial artery with a single anterior needle wall stick.   IV Heparin was given.  Right coronary angiography was done using a Judkins R4 guide catheter.  Left coronary angiography was done using a Judkins L3.5 guide catheter.  Left ventriculography was done using a pigtail catheter.  A TR band was used for hemostasis.   CONTRAST:  Total of 65 cc.  COMPLICATIONS:  None.    HEMODYNAMICS:  Aortic pressure was 137/81; LV pressure was 134/12; LVEDP 20.  There was no gradient between the left ventricle and aorta.    ANGIOGRAPHIC DATA:   The left main coronary artery is widely patent.  The left anterior descending artery is a large vessel proximally. There is a large diagonal which is patent.  The distal LAD is small but patent. There is no significant disease in the LAD system.  The left circumflex artery is a large vessel. There is a small ramus vessel which is patent. The OM1 is medium-sized and patent. The OM 2 is large and widely patent.  The right coronary artery is a large dominant vessel.  There is a large posterior lateral artery which is widely patent. The posterior descending artery is widely patent. There is no significant disease in the RCA system.  LEFT VENTRICULOGRAM:  Left ventricular angiogram was done in the 30 RAO projection and revealed normal left ventricular wall motion and systolic function with an estimated ejection fraction of 60%.  LVEDP was 20 mmHg.  IMPRESSIONS:  1. Normal left main coronary artery. 2. Widely patent left anterior descending artery and its  branches. 3. Widely patent left circumflex artery and its branches. 4. Widely patent right coronary artery. 5. Normal left ventricular systolic function.  LVEDP 20 mmHg.  Ejection fraction 60 %.  RECOMMENDATION:  No significant CAD. False positive stress test. Continue preventative care. No further cardiac testing required prior to knee surgery.

## 2014-01-12 NOTE — Progress Notes (Signed)
Paged Dr. Irish Lack to inform him that pt is not on any diuretic medication currently. MD stated that he will have his office to call in script for pt to pt's pharmacy. Pt informed.

## 2014-01-13 ENCOUNTER — Telehealth: Payer: Self-pay | Admitting: Interventional Cardiology

## 2014-01-13 DIAGNOSIS — R0602 Shortness of breath: Secondary | ICD-10-CM

## 2014-01-13 NOTE — Telephone Encounter (Signed)
Informed patient I will check on this for him.

## 2014-01-13 NOTE — Telephone Encounter (Signed)
New message      Pt had a cath yesterday.  He said the dr was supposed to call him in a new presc for fluid pills.  Please call [facility] in to [facility] [address].  If he is not to have these, please call back

## 2014-01-13 NOTE — Telephone Encounter (Signed)
Diuretic for this patient? Don't see that anything was sent in yet. Will forward to Dr. Irish Lack

## 2014-01-14 NOTE — Telephone Encounter (Signed)
He was supposed to have Lasix 20 mg daily.   I will call it in now.  BMet next week.

## 2014-01-17 ENCOUNTER — Other Ambulatory Visit: Payer: Self-pay

## 2014-01-17 MED ORDER — FUROSEMIDE 20 MG PO TABS: 20.0000 mg | ORAL_TABLET | Freq: Every day | ORAL | Status: DC

## 2014-01-17 NOTE — Addendum Note (Signed)
Addended by: Alvis Lemmings C on: 01/17/2014 12:04 PM   Modules accepted: Orders

## 2014-01-17 NOTE — Telephone Encounter (Signed)
I spoke with the patient. He has not picked up his RX for his lasix yet. He states he will be getting this today. I advised him we will need to re-check labs on him (BMP) early next week. He is agreeable and will come in Monday 01/24/14 to have this done.

## 2014-01-24 ENCOUNTER — Other Ambulatory Visit (INDEPENDENT_AMBULATORY_CARE_PROVIDER_SITE_OTHER): Payer: Managed Care, Other (non HMO) | Admitting: *Deleted

## 2014-01-24 DIAGNOSIS — R0602 Shortness of breath: Secondary | ICD-10-CM

## 2014-01-24 LAB — BASIC METABOLIC PANEL
BUN: 24 mg/dL — ABNORMAL HIGH (ref 6–23)
CO2: 26 mEq/L (ref 19–32)
Calcium: 9.1 mg/dL (ref 8.4–10.5)
Chloride: 106 mEq/L (ref 96–112)
Creatinine, Ser: 1.4 mg/dL (ref 0.4–1.5)
GFR: 57.3 mL/min — ABNORMAL LOW (ref >60.00)
Glucose, Bld: 101 mg/dL — ABNORMAL HIGH (ref 70–99)
Potassium: 4.1 mEq/L (ref 3.5–5.1)
Sodium: 139 mEq/L (ref 135–145)

## 2014-01-25 ENCOUNTER — Telehealth: Payer: Self-pay | Admitting: *Deleted

## 2014-01-25 ENCOUNTER — Telehealth: Payer: Self-pay | Admitting: Interventional Cardiology

## 2014-01-25 NOTE — Telephone Encounter (Signed)
Received request from Nurse fax box, documents faxed for surgical clearance. To: [place] Fax number: [phone] Attention: [id number]/KM

## 2014-01-25 NOTE — Telephone Encounter (Signed)
Dr. Irish Lack reviewed monitor--SR, intermittent Atrial fibrillation. Pt to continue ASA 325 mg daily for stroke prevention. I spoke with pt and reviewed monitor results and recommendations from Dr. Irish Lack with him.  He is taking ASA 325 daily.  I reviewed recent lab results with him. He reports he feels more nervous and weak since starting furosemide.  Shortness of breath with minimal activity. Non productive cough. Chest soreness when coughing.  Dr. Irish Lack would like to see pt back after his knee surgery. Surgery not scheduled yet but pt thinks it will be in next 2 weeks.  Appt made for pt to see Dr. Irish Lack on March 01, 2014 at 8 AM.  I reviewed symptoms with Dr. Irish Lack and instructions given for pt to stop lasix if he feels worse since it was started.  I spoke with pt and gave him instructions to stop furosemide.  Pt will weigh daily and let us know if weight goes up or if he notes increase in swelling.

## 2014-01-29 ENCOUNTER — Ambulatory Visit: Payer: Self-pay | Admitting: Orthopedic Surgery

## 2014-02-03 ENCOUNTER — Telehealth: Payer: Self-pay | Admitting: Interventional Cardiology

## 2014-02-03 NOTE — Telephone Encounter (Signed)
I spoke with the patient. He states he has been tired, experienced some dizziness with coughing, and has been gaining weight over about the last week to week and a half. His weight was 300 lbs and now he is up to 315 lbs. He last checked his weight on Friday night. He was out of town over the weekend racing. He states that he has eaten no more sausage than usual. He did eat chicken and dumplins and had some soup over the weekend. He is propping up a little more at night because for the last 3-4 nights he feels like he is choking when he uses his C-PAP. He states that his swelling is up to the knee and he is able to push in and make an indention in his leg. His EF was normal in December and his heart cath was ok. He is scheduled for knee surgery on 02/25/14. I reviewed with Dr. Irish Lack. He recommends that the patient increase his lasix to 40 mg daily and and repeat a BMP/ BNP in 1 week. I have advised the patient of this. I have spoken with him about his diet and advised that he decrease his sodium intake. He will obtain a scale for home and I have instructed him how to weigh appropriately on a daily basis. I have asked that he call back on Monday and let us know how his weight is doing and at that time, if he shows no improvement, we should bring him in to be seen, if he is better, we will schedule follow up labs per Dr. Irish Lack. The patient is aware and agreeable.

## 2014-02-03 NOTE — Telephone Encounter (Signed)
New Msg        Pt c/o swelling: STAT is pt has developed SOB within 24 hours  1. How long have you been experiencing swelling? About a week after catherization  2. Where is the swelling located? *Legs and ankles  3.  Are you currently taking a "fluid pill"?  Yes, furosemide 20 mg   4.  Are you currently SOB? Pt already has cpap machine and now feels as if he may suffocate with it. Can't walk long amounts without feeling like passing out.   5.  Have you traveled recently? Yes, Pt went to [place], [place] over the weekend  Pt states he was advised to call and report his condition.   Please call [phone].

## 2014-02-09 ENCOUNTER — Telehealth: Payer: Self-pay | Admitting: *Deleted

## 2014-02-09 DIAGNOSIS — I10 Essential (primary) hypertension: Secondary | ICD-10-CM

## 2014-02-09 MED ORDER — FUROSEMIDE 40 MG PO TABS: 40.0000 mg | ORAL_TABLET | Freq: Every day | ORAL | Status: DC

## 2014-02-09 NOTE — Telephone Encounter (Signed)
Patient called for a refill on the recently increased dose of furosemide 40mg . I mentioned that he was supposed to come in and have a BMP/ BNP drawn but he stated that he was not aware of this, but is willing to come in if the weather doesn't get too bad.  I am routing because I was unsure if he needed orders put in for the lab.

## 2014-02-10 NOTE — Telephone Encounter (Signed)
Patient has an appointment with Dr Irish Lack on 03/01/14, could he wait until then for lab work? Please advise. Thanks, MI

## 2014-02-10 NOTE — Telephone Encounter (Signed)
Can have [facility] on Monday.

## 2014-02-14 NOTE — Telephone Encounter (Signed)
Pt returned called.  Spoke with pt about coming in for labs and he said he didn't think that he would be able to get out of his driveway today due to the ice and snow but that he could get out tomorrow.  Made appt for pt and he said that he would come in tomorrow for labs.

## 2014-02-14 NOTE — Telephone Encounter (Signed)
Attempted to call pt and ask if he was able to come in for lab work today.  Voicemail box full at this time. Was unable to leave message.

## 2014-02-15 ENCOUNTER — Other Ambulatory Visit (INDEPENDENT_AMBULATORY_CARE_PROVIDER_SITE_OTHER): Payer: Managed Care, Other (non HMO) | Admitting: *Deleted

## 2014-02-15 ENCOUNTER — Encounter (HOSPITAL_BASED_OUTPATIENT_CLINIC_OR_DEPARTMENT_OTHER): Payer: Self-pay | Admitting: *Deleted

## 2014-02-15 ENCOUNTER — Encounter (HOSPITAL_BASED_OUTPATIENT_CLINIC_OR_DEPARTMENT_OTHER)
Admission: RE | Admit: 2014-02-15 | Discharge: 2014-02-15 | Disposition: A | Discharge status: Home / Self Care | Payer: Managed Care, Other (non HMO) | Source: Ambulatory Visit | Attending: Orthopedic Surgery | Admitting: Orthopedic Surgery

## 2014-02-15 DIAGNOSIS — Z01818 Encounter for other preprocedural examination: Secondary | ICD-10-CM | POA: Diagnosis not present

## 2014-02-15 DIAGNOSIS — I10 Essential (primary) hypertension: Secondary | ICD-10-CM

## 2014-02-15 DIAGNOSIS — M1711 Unilateral primary osteoarthritis, right knee: Secondary | ICD-10-CM | POA: Diagnosis not present

## 2014-02-15 LAB — SURGICAL PCR SCREEN
MRSA, PCR: NEGATIVE
Staphylococcus aureus: NEGATIVE

## 2014-02-15 LAB — BASIC METABOLIC PANEL
BUN: 20 mg/dL (ref 6–23)
CO2: 26 mEq/L (ref 19–32)
Calcium: 9.2 mg/dL (ref 8.4–10.5)
Chloride: 104 mEq/L (ref 96–112)
Creatinine, Ser: 1.12 mg/dL (ref 0.40–1.50)
GFR: 72.89 mL/min (ref >60.00)
Glucose, Bld: 103 mg/dL — ABNORMAL HIGH (ref 70–99)
Potassium: 4.2 mEq/L (ref 3.5–5.1)
Sodium: 137 mEq/L (ref 135–145)

## 2014-02-15 NOTE — Progress Notes (Signed)
Seen by Dr Therisa Doyne for anesthesia consult,  Cleared for surgery at [facility]

## 2014-02-15 NOTE — Progress Notes (Signed)
Pt swabbed  for MRSA.  Pt instructed on procedure for shower with surgical scrub night prior to surgery and morning of surgery. Instruction sheet given with same instructions.  Given instruction sheet for administration of Murpirocin and shown how to place in nostrils.  Pt voiced understanding and teach back method used for this teaching.  Pt given scrub, info sheets and understands I will call if need to have med and do scrub.

## 2014-02-17 ENCOUNTER — Other Ambulatory Visit: Payer: Self-pay | Admitting: *Deleted

## 2014-02-17 MED ORDER — FUROSEMIDE 40 MG PO TABS: 40.0000 mg | ORAL_TABLET | Freq: Every day | ORAL | Status: DC

## 2014-02-25 ENCOUNTER — Ambulatory Visit (HOSPITAL_BASED_OUTPATIENT_CLINIC_OR_DEPARTMENT_OTHER): Payer: Managed Care, Other (non HMO) | Admitting: Anesthesiology

## 2014-02-25 ENCOUNTER — Encounter (HOSPITAL_BASED_OUTPATIENT_CLINIC_OR_DEPARTMENT_OTHER)
Admission: RE | Disposition: A | Discharge status: Home / Self Care | Payer: Self-pay | Source: Ambulatory Visit | Attending: Orthopedic Surgery

## 2014-02-25 ENCOUNTER — Encounter (HOSPITAL_BASED_OUTPATIENT_CLINIC_OR_DEPARTMENT_OTHER): Payer: Self-pay | Admitting: *Deleted

## 2014-02-25 ENCOUNTER — Ambulatory Visit (HOSPITAL_COMMUNITY): Payer: Managed Care, Other (non HMO)

## 2014-02-25 ENCOUNTER — Ambulatory Visit (HOSPITAL_BASED_OUTPATIENT_CLINIC_OR_DEPARTMENT_OTHER)
Admission: RE | Admit: 2014-02-25 | Discharge: 2014-02-26 | Disposition: A | Discharge status: Home / Self Care | Payer: Managed Care, Other (non HMO) | Source: Ambulatory Visit | Attending: Orthopedic Surgery | Admitting: Orthopedic Surgery

## 2014-02-25 DIAGNOSIS — I252 Old myocardial infarction: Secondary | ICD-10-CM | POA: Diagnosis not present

## 2014-02-25 DIAGNOSIS — Z6841 Body Mass Index (BMI) 40.0 and over, adult: Secondary | ICD-10-CM | POA: Insufficient documentation

## 2014-02-25 DIAGNOSIS — Z79899 Other long term (current) drug therapy: Secondary | ICD-10-CM | POA: Insufficient documentation

## 2014-02-25 DIAGNOSIS — Z8261 Family history of arthritis: Secondary | ICD-10-CM | POA: Diagnosis not present

## 2014-02-25 DIAGNOSIS — Z8249 Family history of ischemic heart disease and other diseases of the circulatory system: Secondary | ICD-10-CM | POA: Diagnosis not present

## 2014-02-25 DIAGNOSIS — M1711 Unilateral primary osteoarthritis, right knee: Secondary | ICD-10-CM | POA: Insufficient documentation

## 2014-02-25 DIAGNOSIS — Z791 Long term (current) use of non-steroidal anti-inflammatories (NSAID): Secondary | ICD-10-CM | POA: Insufficient documentation

## 2014-02-25 DIAGNOSIS — G4733 Obstructive sleep apnea (adult) (pediatric): Secondary | ICD-10-CM | POA: Diagnosis not present

## 2014-02-25 DIAGNOSIS — I10 Essential (primary) hypertension: Secondary | ICD-10-CM | POA: Insufficient documentation

## 2014-02-25 DIAGNOSIS — Z96651 Presence of right artificial knee joint: Secondary | ICD-10-CM

## 2014-02-25 DIAGNOSIS — K219 Gastro-esophageal reflux disease without esophagitis: Secondary | ICD-10-CM | POA: Diagnosis not present

## 2014-02-25 DIAGNOSIS — M1990 Unspecified osteoarthritis, unspecified site: Secondary | ICD-10-CM | POA: Diagnosis not present

## 2014-02-25 DIAGNOSIS — Z7982 Long term (current) use of aspirin: Secondary | ICD-10-CM | POA: Diagnosis not present

## 2014-02-25 HISTORY — PX: PARTIAL KNEE ARTHROPLASTY: SHX2174

## 2014-02-25 HISTORY — DX: Unilateral primary osteoarthritis, right knee: M17.11

## 2014-02-25 LAB — POCT HEMOGLOBIN-HEMACUE: Hemoglobin: 16.1 g/dL (ref 13.0–17.0)

## 2014-02-25 SURGERY — ARTHROPLASTY, KNEE, UNICOMPARTMENTAL
Anesthesia: General | Site: Knee | Laterality: Right

## 2014-02-25 MED ORDER — BACLOFEN 10 MG PO TABS: 10.0000 mg | ORAL_TABLET | Freq: Three times a day (TID) | ORAL | Status: DC

## 2014-02-25 MED ORDER — LIDOCAINE HCL (CARDIAC) 20 MG/ML IV SOLN
INTRAVENOUS | Status: DC | PRN
  Administered 2014-02-25: 60 mg via INTRAVENOUS

## 2014-02-25 MED ORDER — HYDROMORPHONE HCL 1 MG/ML IJ SOLN: 0.2500 mg | INTRAMUSCULAR | Status: DC | PRN

## 2014-02-25 MED ORDER — OXYCODONE HCL 5 MG PO TABS: 5.0000 mg | ORAL_TABLET | Freq: Once | ORAL | Status: AC | PRN

## 2014-02-25 MED ORDER — MORPHINE SULFATE 10 MG/ML IJ SOLN
INTRAMUSCULAR | Status: DC | PRN
  Administered 2014-02-25 (×2): 2 mg via INTRAVENOUS

## 2014-02-25 MED ORDER — LACTATED RINGERS IV SOLN
INTRAVENOUS | Status: DC
  Administered 2014-02-25: 12:00:00 via INTRAVENOUS

## 2014-02-25 MED ORDER — MIDAZOLAM HCL 2 MG/2ML IJ SOLN
INTRAMUSCULAR | Status: AC
  Filled 2014-02-25: qty 2

## 2014-02-25 MED ORDER — MORPHINE SULFATE 10 MG/ML IJ SOLN
INTRAMUSCULAR | Status: AC
  Filled 2014-02-25: qty 1

## 2014-02-25 MED ORDER — ONDANSETRON HCL 4 MG/2ML IJ SOLN: 4.0000 mg | Freq: Four times a day (QID) | INTRAMUSCULAR | Status: DC | PRN

## 2014-02-25 MED ORDER — METOCLOPRAMIDE HCL 5 MG PO TABS: 5.0000 mg | ORAL_TABLET | Freq: Three times a day (TID) | ORAL | Status: DC | PRN

## 2014-02-25 MED ORDER — BISACODYL 10 MG RE SUPP: 10.0000 mg | Freq: Every day | RECTAL | Status: DC | PRN

## 2014-02-25 MED ORDER — SODIUM CHLORIDE 0.9 % IR SOLN
Status: DC | PRN
  Administered 2014-02-25: 1000 mL

## 2014-02-25 MED ORDER — MAGNESIUM CITRATE PO SOLN: 1.0000 | Freq: Once | ORAL | Status: AC | PRN

## 2014-02-25 MED ORDER — SENNA-DOCUSATE SODIUM 8.6-50 MG PO TABS: 2.0000 | ORAL_TABLET | Freq: Every day | ORAL | Status: DC

## 2014-02-25 MED ORDER — DEXTROSE 5 % IV SOLN
10.0000 mg | INTRAVENOUS | Status: DC | PRN
  Administered 2014-02-25: 50 ug/min via INTRAVENOUS

## 2014-02-25 MED ORDER — SENNA 8.6 MG PO TABS
1.0000 | ORAL_TABLET | Freq: Two times a day (BID) | ORAL | Status: DC
  Administered 2014-02-25: 8.6 mg via ORAL
  Filled 2014-02-25: qty 1

## 2014-02-25 MED ORDER — MIDAZOLAM HCL 2 MG/2ML IJ SOLN
1.0000 mg | INTRAMUSCULAR | Status: DC | PRN
  Administered 2014-02-25: 2 mg via INTRAVENOUS

## 2014-02-25 MED ORDER — DOCUSATE SODIUM 100 MG PO CAPS
100.0000 mg | ORAL_CAPSULE | Freq: Two times a day (BID) | ORAL | Status: DC
  Administered 2014-02-25: 100 mg via ORAL
  Filled 2014-02-25: qty 1

## 2014-02-25 MED ORDER — ONDANSETRON HCL 4 MG/2ML IJ SOLN
INTRAMUSCULAR | Status: DC | PRN
  Administered 2014-02-25: 4 mg via INTRAVENOUS

## 2014-02-25 MED ORDER — PROPOFOL 10 MG/ML IV BOLUS
INTRAVENOUS | Status: DC | PRN
  Administered 2014-02-25: 300 mg via INTRAVENOUS

## 2014-02-25 MED ORDER — OXYCODONE-ACETAMINOPHEN 5-325 MG PO TABS: 1.0000 | ORAL_TABLET | ORAL | Status: DC | PRN

## 2014-02-25 MED ORDER — METOPROLOL SUCCINATE 12.5 MG HALF TABLET
12.5000 mg | ORAL_TABLET | Freq: Two times a day (BID) | ORAL | Status: DC
  Administered 2014-02-25: 12.5 mg via ORAL

## 2014-02-25 MED ORDER — ONDANSETRON HCL 4 MG PO TABS: 4.0000 mg | ORAL_TABLET | Freq: Three times a day (TID) | ORAL | Status: DC | PRN

## 2014-02-25 MED ORDER — BUPIVACAINE-EPINEPHRINE (PF) 0.5% -1:200000 IJ SOLN
INTRAMUSCULAR | Status: DC | PRN
  Administered 2014-02-25: 30 mL via PERINEURAL

## 2014-02-25 MED ORDER — CEFAZOLIN SODIUM-DEXTROSE 2-3 GM-% IV SOLR
2.0000 g | Freq: Four times a day (QID) | INTRAVENOUS | Status: DC
  Administered 2014-02-25 – 2014-02-26 (×2): 2 g via INTRAVENOUS
  Filled 2014-02-25 (×2): qty 50

## 2014-02-25 MED ORDER — PANTOPRAZOLE SODIUM 40 MG PO TBEC: 80.0000 mg | DELAYED_RELEASE_TABLET | Freq: Every day | ORAL | Status: DC

## 2014-02-25 MED ORDER — ROPIVACAINE HCL 5 MG/ML IJ SOLN
INTRAMUSCULAR | Status: DC | PRN
  Administered 2014-02-25: 10 mL via PERINEURAL

## 2014-02-25 MED ORDER — ONDANSETRON HCL 4 MG/2ML IJ SOLN: 4.0000 mg | Freq: Once | INTRAMUSCULAR | Status: AC | PRN

## 2014-02-25 MED ORDER — METOCLOPRAMIDE HCL 5 MG/ML IJ SOLN: 5.0000 mg | Freq: Three times a day (TID) | INTRAMUSCULAR | Status: DC | PRN

## 2014-02-25 MED ORDER — CEFAZOLIN SODIUM-DEXTROSE 2-3 GM-% IV SOLR
INTRAVENOUS | Status: AC
  Filled 2014-02-25: qty 50

## 2014-02-25 MED ORDER — FENTANYL CITRATE 0.05 MG/ML IJ SOLN
INTRAMUSCULAR | Status: AC
  Filled 2014-02-25: qty 2

## 2014-02-25 MED ORDER — FENTANYL CITRATE 0.05 MG/ML IJ SOLN
INTRAMUSCULAR | Status: AC
Start: 2014-02-25 — End: 2014-02-25
  Filled 2014-02-25: qty 2

## 2014-02-25 MED ORDER — POLYETHYLENE GLYCOL 3350 17 G PO PACK: 17.0000 g | PACK | Freq: Every day | ORAL | Status: DC | PRN

## 2014-02-25 MED ORDER — OXYCODONE-ACETAMINOPHEN 10-325 MG PO TABS: 1.0000 | ORAL_TABLET | Freq: Four times a day (QID) | ORAL | Status: DC | PRN

## 2014-02-25 MED ORDER — DEXAMETHASONE SODIUM PHOSPHATE 4 MG/ML IJ SOLN
INTRAMUSCULAR | Status: DC | PRN
  Administered 2014-02-25: 10 mg via INTRAVENOUS

## 2014-02-25 MED ORDER — ONDANSETRON HCL 4 MG PO TABS: 4.0000 mg | ORAL_TABLET | Freq: Four times a day (QID) | ORAL | Status: DC | PRN

## 2014-02-25 MED ORDER — DEXTROSE 5 % IV SOLN
3.0000 g | INTRAVENOUS | Status: AC
  Administered 2014-02-25: 3 g via INTRAVENOUS

## 2014-02-25 MED ORDER — FUROSEMIDE 40 MG PO TABS
40.0000 mg | ORAL_TABLET | Freq: Every day | ORAL | Status: DC
  Administered 2014-02-25: 40 mg via ORAL

## 2014-02-25 MED ORDER — RIVAROXABAN 10 MG PO TABS: 10.0000 mg | ORAL_TABLET | Freq: Every day | ORAL | Status: DC

## 2014-02-25 MED ORDER — OXYCODONE HCL 5 MG PO TABS
5.0000 mg | ORAL_TABLET | ORAL | Status: DC | PRN
  Administered 2014-02-25 – 2014-02-26 (×3): 5 mg via ORAL
  Filled 2014-02-25 (×3): qty 1

## 2014-02-25 MED ORDER — METHOCARBAMOL 500 MG PO TABS
500.0000 mg | ORAL_TABLET | Freq: Four times a day (QID) | ORAL | Status: DC | PRN
  Administered 2014-02-25 – 2014-02-26 (×2): 500 mg via ORAL
  Filled 2014-02-25 (×2): qty 1

## 2014-02-25 MED ORDER — DEXTROSE 5 % IV SOLN: 500.0000 mg | Freq: Four times a day (QID) | INTRAVENOUS | Status: DC | PRN

## 2014-02-25 MED ORDER — SODIUM CHLORIDE 0.9 % IV SOLN: INTRAVENOUS | Status: DC

## 2014-02-25 MED ORDER — HYDROMORPHONE HCL 1 MG/ML IJ SOLN: 0.5000 mg | INTRAMUSCULAR | Status: DC | PRN

## 2014-02-25 MED ORDER — FENTANYL CITRATE 0.05 MG/ML IJ SOLN
50.0000 ug | INTRAMUSCULAR | Status: DC | PRN
  Administered 2014-02-25: 100 ug via INTRAVENOUS

## 2014-02-25 MED ORDER — CEFAZOLIN SODIUM 1-5 GM-% IV SOLN
INTRAVENOUS | Status: AC
  Filled 2014-02-25: qty 50

## 2014-02-25 MED ORDER — OXYCODONE HCL 5 MG/5ML PO SOLN: 5.0000 mg | Freq: Once | ORAL | Status: AC | PRN

## 2014-02-25 MED ORDER — ZOLPIDEM TARTRATE 5 MG PO TABS: 5.0000 mg | ORAL_TABLET | Freq: Every evening | ORAL | Status: DC | PRN

## 2014-02-25 SURGICAL SUPPLY — 67 items
BANDAGE ELASTIC 6 VELCRO ST LF (GAUZE/BANDAGES/DRESSINGS) ×2 IMPLANT
BANDAGE ESMARK 6X9 LF (GAUZE/BANDAGES/DRESSINGS) ×1 IMPLANT
BIT DRILL QUICK REL 1/8 2PK SL (DRILL) IMPLANT
BLADE SURG 10 STRL SS (BLADE) ×2 IMPLANT
BLADE SURG 15 STRL LF DISP TIS (BLADE) ×2 IMPLANT
BLADE SURG 15 STRL SS (BLADE) ×2
BNDG ESMARK 6X9 LF (GAUZE/BANDAGES/DRESSINGS) ×2
BOWL SMART MIX CTS (DISPOSABLE) ×2 IMPLANT
CANISTER SUCT 1200ML W/VALVE (MISCELLANEOUS) IMPLANT
CANISTER SUCTION 2500CC (MISCELLANEOUS) IMPLANT
CAPT KNEE PARTIAL 2 ×2 IMPLANT
CEMENT HV SMART SET (Cement) ×2 IMPLANT
CLSR STERI-STRIP ANTIMIC 1/2X4 (GAUZE/BANDAGES/DRESSINGS) ×2 IMPLANT
COVER BACK TABLE 60X90IN (DRAPES) ×2 IMPLANT
CUFF TOURNIQUET SINGLE 34IN LL (TOURNIQUET CUFF) IMPLANT
CUFF TOURNIQUET SINGLE 44IN (TOURNIQUET CUFF) ×2 IMPLANT
DECANTER SPIKE VIAL GLASS SM (MISCELLANEOUS) IMPLANT
DRAPE EXTREMITY T 121X128X90 (DRAPE) ×2 IMPLANT
DRAPE U 20/CS (DRAPES) ×2 IMPLANT
DRAPE U-SHAPE 47X51 STRL (DRAPES) ×2 IMPLANT
DRILL QUICK RELEASE 1/8 INCH (DRILL)
DRSG PAD ABDOMINAL 8X10 ST (GAUZE/BANDAGES/DRESSINGS) ×2 IMPLANT
DURAPREP 26ML APPLICATOR (WOUND CARE) ×2 IMPLANT
ELECT REM PT RETURN 9FT ADLT (ELECTROSURGICAL) ×2
ELECTRODE REM PT RTRN 9FT ADLT (ELECTROSURGICAL) ×1 IMPLANT
FACESHIELD WRAPAROUND (MASK) ×4 IMPLANT
GAUZE SPONGE 4X4 12PLY STRL (GAUZE/BANDAGES/DRESSINGS) ×2 IMPLANT
GLOVE BIO SURGEON STRL SZ 6.5 (GLOVE) ×2 IMPLANT
GLOVE BIO SURGEON STRL SZ8 (GLOVE) ×2 IMPLANT
GLOVE BIOGEL PI IND STRL 7.0 (GLOVE) ×3 IMPLANT
GLOVE BIOGEL PI IND STRL 8 (GLOVE) ×2 IMPLANT
GLOVE BIOGEL PI INDICATOR 7.0 (GLOVE) ×3
GLOVE BIOGEL PI INDICATOR 8 (GLOVE) ×2
GLOVE ECLIPSE 6.5 STRL STRAW (GLOVE) ×2 IMPLANT
GLOVE ORTHO TXT STRL SZ7.5 (GLOVE) ×2 IMPLANT
GOWN STRL REUS W/ TWL LRG LVL3 (GOWN DISPOSABLE) ×2 IMPLANT
GOWN STRL REUS W/ TWL XL LVL3 (GOWN DISPOSABLE) ×2 IMPLANT
GOWN STRL REUS W/TWL LRG LVL3 (GOWN DISPOSABLE) ×2
GOWN STRL REUS W/TWL XL LVL3 (GOWN DISPOSABLE) ×2
HANDPIECE INTERPULSE COAX TIP (DISPOSABLE) ×1
IMMOBILIZER KNEE 22 UNIV (SOFTGOODS) IMPLANT
IMMOBILIZER KNEE 24 THIGH 36 (MISCELLANEOUS) ×1 IMPLANT
IMMOBILIZER KNEE 24 UNIV (MISCELLANEOUS) ×2
KNEE WRAP E Z 3 GEL PACK (MISCELLANEOUS) ×2 IMPLANT
MANIFOLD NEPTUNE II (INSTRUMENTS) ×2 IMPLANT
NS IRRIG 1000ML POUR BTL (IV SOLUTION) ×2 IMPLANT
PACK ARTHROSCOPY DSU (CUSTOM PROCEDURE TRAY) ×2 IMPLANT
PACK BASIN DAY SURGERY FS (CUSTOM PROCEDURE TRAY) ×2 IMPLANT
PENCIL BUTTON HOLSTER BLD 10FT (ELECTRODE) ×2 IMPLANT
SAWBLADE OXFORD PARTIAL (BLADE) ×2 IMPLANT
SET HNDPC FAN SPRY TIP SCT (DISPOSABLE) ×1 IMPLANT
SHEET MEDIUM DRAPE 40X70 STRL (DRAPES) ×2 IMPLANT
SLEEVE SCD COMPRESS KNEE MED (MISCELLANEOUS) ×2 IMPLANT
SPONGE LAP 18X18 X RAY DECT (DISPOSABLE) ×2 IMPLANT
SUCTION FRAZIER TIP 10 FR DISP (SUCTIONS) ×2 IMPLANT
SUT MNCRL AB 4-0 PS2 18 (SUTURE) IMPLANT
SUT VIC AB 0 CT1 27 (SUTURE) ×1
SUT VIC AB 0 CT1 27XBRD ANBCTR (SUTURE) ×1 IMPLANT
SUT VIC AB 2-0 SH 27 (SUTURE) ×1
SUT VIC AB 2-0 SH 27XBRD (SUTURE) ×1 IMPLANT
SUT VICRYL 3-0 CR8 SH (SUTURE) ×2 IMPLANT
SUT VICRYL 4-0 PS2 18IN ABS (SUTURE) IMPLANT
SYR BULB IRRIGATION 50ML (SYRINGE) ×4 IMPLANT
TOWEL OR 17X24 6PK STRL BLUE (TOWEL DISPOSABLE) ×2 IMPLANT
TOWEL OR NON WOVEN STRL DISP B (DISPOSABLE) ×4 IMPLANT
TUBE CONNECTING 20X1/4 (TUBING) IMPLANT
YANKAUER SUCT BULB TIP NO VENT (SUCTIONS) IMPLANT

## 2014-02-25 NOTE — Op Note (Addendum)
02/25/2014  5:18 PM  PATIENT:  Richard [name]    PRE-OPERATIVE DIAGNOSIS:  UNILATERAL PRIMARY OSTEOARTHRITIS RIGHT KNEE   POST-OPERATIVE DIAGNOSIS:  Same, with substantial prepatellar bursitis  PROCEDURE:  RIGHT KNEE ARTHROPLASTY CONDYLE AND PLATEAU MEDIAL COMPARTMENT with open right knee prepatellar bursectomy  SURGEON:  Johnny Bridge, MD  PHYSICIAN ASSISTANT: Joya Gaskins, OPA-C, present and scrubbed throughout the case, critical for completion in a timely fashion, and for retraction, instrumentation, and closure.  ANESTHESIA:   General  PREOPERATIVE INDICATIONS:  Richard [name] is a  54 y.o. male with a diagnosis of UNILATERAL PRIMARY OSTEOARTHRITIS RIGHT KNEE  who failed conservative measures and elected for surgical management.    The risks benefits and alternatives were discussed with the patient preoperatively including but not limited to the risks of infection, bleeding, nerve injury, cardiopulmonary complications, blood clots, the need for revision surgery, among others, and the patient was willing to proceed.  OPERATIVE IMPLANTS: Biomet Oxford mobile bearing medial compartment arthroplasty femur size large, tibia size E, bearing size 3.  OPERATIVE FINDINGS: Endstage grade 4 medial compartment osteoarthritis.  There were moderate patellofemoral joint changes, no changes in the lateral compartment. The undersurface of the patella itself was in good condition. There was substantial medial osteophyte formation on the femur. The ACL was intact.  OPERATIVE PROCEDURE: The patient was brought to the operating room placed in supine position. General anesthesia was administered. IV antibiotics were given. The lower extremity was placed in the legholder and prepped and draped in usual sterile fashion.  Time out was performed.  The leg was elevated and exsanguinated and the tourniquet was inflated. Anteromedial incision was performed, and I took care to preserve the MCL.  Upon incision through the skin and I encountered very abnormal prepatellar thickened bursal tissue, that almost looked necrotic, but without evidence for infection, but very abnormal and very thick. I excised this in entirety which left a normal anterior knee fascial layer. Parapatellar incision was carried out, and the osteophytes were excised, along with the medial meniscus and a small portion of the fat pad.  The extra medullary tibial cutting jig was applied, using the spoon and the 1mm G-Clamp and the 2 mm shim, and I took care to protect the anterior cruciate ligament insertion and the tibial spine. The medial collateral ligament was also protected, and I resected my proximal tibia, matching the anatomic slope.   The proximal tibial bony cut was removed , but it was in multiple pieces, and the saw blade itself was not actually long enough to pass over the jig already the back of the tibia and I had to complete the tibial cut with a freehand type technique. It was fairly challenging to get the tibial bone out. Access was difficult given his size.  The intramedullary femoral rod was placed using the drill, and then using the appropriate reference, I assembled the femoral jig, setting my posterior cutting block. I resected my posterior femur, used the 0 spigot for the anterior femur, and then measured my gap.   I then used the appropriate mill to match the extension gap to the flexion gap. The second milling was at a 2.  The gaps were then measured again with the appropriate feeler gauges. Once I had balanced flexion and extension gaps, I then completed the preparation of the femur.  I milled off the anterior aspect of the distal femur to prevent impingement. I also exposed the tibia, and selected the above-named component, and then used  the cutting jig to prepare the keel slot on the tibia. I also used the awl to curette out the bone to complete the preparation of the keel. The back wall was  intact.  I then placed trial components, and it was found to have excellent motion, and appropriate balance.  I then cemented the components into place, cementing the tibia first, removing all excess cement, and then cementing the femur.  All loose cement was removed. There was a large piece that had set up in the back that was removed after complete cement curing.  The real polyethylene insert was applied manually, and it was fairly challenging to get in, but it did snap nicely into place ultimately, and the knee was taken through functional range of motion, and found to have excellent stability and restoration of joint motion, with excellent balance.  The wounds were irrigated copiously, and the parapatellar tissue closed with Vicryl, followed by Vicryl for the subcutaneous tissue, with routine closure with Steri-Strips and sterile gauze.  The tourniquet was released, and the patient was awakened and extubated and returned to PACU in stable and satisfactory condition. There were no complications.

## 2014-02-25 NOTE — Transfer of Care (Signed)
Immediate Anesthesia Transfer of Care Note  Patient: Richard [name]  Procedure(s) Performed: Procedure(s): RIGHT KNEE ARTHROPLASTY CONDYLE AND PLATEAU MEDIAL COMPARTMENT  (Right)  Patient Location: PACU  Anesthesia Type:General  Level of Consciousness: awake and sedated  Airway & Oxygen Therapy: Patient Spontanous Breathing and Patient connected to face mask oxygen  Post-op Assessment: Report given to RN and Post -op Vital signs reviewed and stable  Post vital signs: Reviewed and stable  Last Vitals:  Filed Vitals:   02/25/14 1746  BP:   Pulse: 80  Temp:   Resp: 15    Complications: No apparent anesthesia complications

## 2014-02-25 NOTE — Anesthesia Procedure Notes (Addendum)
Anesthesia Regional Block:  Adductor canal block  Pre-Anesthetic Checklist: ,, timeout performed, Correct Patient, Correct Site, Correct Laterality, Correct Procedure, Correct Position, site marked, Risks and benefits discussed,  Surgical consent,  Pre-op evaluation,  At surgeon's request and post-op pain management  Laterality: Right and Lower  Prep: chloraprep       Needles:  Injection technique: Single-shot  Needle Type: Echogenic Needle     Needle Length: 9cm 9 cm Needle Gauge: 21 and 21 G    Additional Needles:  Procedures: ultrasound guided (picture in chart) Adductor canal block Narrative:  Start time: 02/25/2014 1:15 PM End time: 02/25/2014 1:21 PM Injection made incrementally with aspirations every 5 mL.  Performed by: Personally  Anesthesiologist: CREWS, DAVID A   Procedure Name: LMA Insertion Performed by: Terrance Mass Pre-anesthesia Checklist: Patient identified, Emergency Drugs available, Timeout performed, Suction available and Patient being monitored Patient Re-evaluated:Patient Re-evaluated prior to inductionOxygen Delivery Method: Circle system utilized Preoxygenation: Pre-oxygenation with 100% oxygen Intubation Type: IV induction Ventilation: Mask ventilation without difficulty LMA: LMA inserted LMA Size: 5.0 Tube type: Oral Number of attempts: 1 Placement Confirmation: positive ETCO2 Tube secured with: Tape Dental Injury: Teeth and Oropharynx as per pre-operative assessment

## 2014-02-25 NOTE — Progress Notes (Signed)
Assisted Dr. Al Corpus with right, ultrasound guided, adductor canal block. Side rails up, monitors on throughout procedure. See vital signs in flow sheet. Tolerated Procedure well.

## 2014-02-25 NOTE — H&P (Signed)
PREOPERATIVE H&P  Chief Complaint: UNILATERAL PRIMARY OSTEOARTHRITIS RIGHT KNEE   HPI: Richard [name] is a 54 y.o. male who presents for preoperative history and physical with a diagnosis of UNILATERAL PRIMARY OSTEOARTHRITIS RIGHT KNEE . Symptoms are rated as moderate to severe, and have been worsening.  This is significantly impairing activities of daily living.  He has elected for surgical management.   He has failed injections, activity modification, anti-inflammatories, and assistive devices.  Preoperative X-rays demonstrate end stage degenerative changes with osteophyte formation, loss of joint space, subchondral sclerosis.   Past Medical History  Diagnosis Date  . Obesity   . OSA (obstructive sleep apnea)   . Hypertension 08/17/2011  . Chest pain 08/16/2011    MI ruled out.  Marland Kitchen LVH (left ventricular hypertrophy) 08/17/2011    Per 2-D echocardiogram.  . GERD (gastroesophageal reflux disease) 08/17/2011  . Arthritis   . Urine incontinence    Past Surgical History  Procedure Laterality Date  . Appendectomy    . Orthopedic surgery    . Left heart catheterization with coronary angiogram N/A 01/12/2014    Procedure: LEFT HEART CATHETERIZATION WITH CORONARY ANGIOGRAM;  Surgeon: Jettie Booze, MD;  Location: [facility] CATH LAB;  Service: Cardiovascular;  Laterality: N/A;   History   Social History  . Marital Status: Married    Spouse Name: N/A    Number of Children: N/A  . Years of Education: N/A   Occupational History  . tiler    Social History Main Topics  . Smoking status: Never Smoker   . Smokeless tobacco: Never Used  . Alcohol Use: Yes     Comment: occassional  . Drug Use: No  . Sexual Activity: Yes   Other Topics Concern  . None   Social History Narrative   Family History  Problem Relation Age of Onset  . Coronary artery disease Mother   . Hyperlipidemia Mother   . Hypertension Mother   . Arthritis Mother   . Coronary artery disease Father     Allergies  Allergen Reactions  . Penicillins Other (See Comments)    REACTION: UNKNOWN   Prior to Admission medications   Medication Sig Start Date End Date Taking? Authorizing Provider  aspirin EC 325 MG tablet Take 325 mg by mouth daily.   Yes Historical Provider, MD  esomeprazole (NEXIUM) 40 MG capsule Take 40 mg by mouth daily at 12 noon.   Yes Historical Provider, MD  furosemide (LASIX) 40 MG tablet Take 1 tablet (40 mg total) by mouth daily. 02/17/14   Jettie Booze, MD  HYDROcodone-acetaminophen (NORCO/VICODIN) 5-325 MG per tablet Take 1 tablet by mouth every 6 (six) hours as needed (pain).  10/25/13   Historical Provider, MD  meloxicam (MOBIC) 15 MG tablet Take 15 mg by mouth daily.  10/25/13   Historical Provider, MD  metoprolol succinate (TOPROL XL) 25 MG 24 hr tablet Take 0.5 tablets (12.5 mg total) by mouth 2 (two) times daily. 12/30/13   Fay Records, MD  traMADol (ULTRAM) 50 MG tablet Take 1 tablet (50 mg total) by mouth every 12 (twelve) hours as needed. Patient taking differently: Take 50 mg by mouth every 12 (twelve) hours as needed (pain).  10/22/13   Jearld Fenton, NP     Positive ROS: All other systems have been reviewed and were otherwise negative with the exception of those mentioned in the HPI and as above.  Physical Exam: General: Alert, no acute distress Cardiovascular: No pedal edema Respiratory: No  cyanosis, no use of accessory musculature GI: No organomegaly, abdomen is soft and non-tender Skin: No lesions in the area of chief complaint Neurologic: Sensation intact distally Psychiatric: Patient is competent for consent with normal mood and affect Lymphatic: No axillary or cervical lymphadenopathy  MUSCULOSKELETAL: Active motion of the right knee is 0-120. He is stable to ligamentous exam. He has positive medial joint line tenderness.  Assessment: UNILATERAL PRIMARY OSTEOARTHRITIS RIGHT KNEE   Plan: Plan for Procedure(s): RIGHT KNEE ARTHROPLASTY  CONDYLE AND PLATEAU MEDIAL COMPARTMENT   The risks benefits and alternatives were discussed with the patient including but not limited to the risks of nonoperative treatment, versus surgical intervention including infection, bleeding, nerve injury,  blood clots, cardiopulmonary complications, morbidity, mortality, among others, and they were willing to proceed.   Johnny Bridge, MD Cell [phone]   02/25/2014 9:17 AM

## 2014-02-25 NOTE — Anesthesia Preprocedure Evaluation (Addendum)
Anesthesia Evaluation  Patient identified by MRN, date of birth, ID band Patient awake    Reviewed: Allergy & Precautions, NPO status , Patient's Chart, lab work & pertinent test results  Airway Mallampati: I  TM Distance: >3 FB Neck ROM: Full    Dental  (+) Teeth Intact, Dental Advisory Given   Pulmonary sleep apnea and Continuous Positive Airway Pressure Ventilation ,  breath sounds clear to auscultation        Cardiovascular hypertension, Pt. on medications Rhythm:Regular Rate:Normal     Neuro/Psych    GI/Hepatic GERD-  Medicated and Controlled,  Endo/Other  Morbid obesity  Renal/GU      Musculoskeletal   Abdominal   Peds  Hematology   Anesthesia Other Findings   Reproductive/Obstetrics                            Anesthesia Physical Anesthesia Plan  ASA: III  Anesthesia Plan: General   Post-op Pain Management:    Induction: Intravenous  Airway Management Planned: Oral ETT  Additional Equipment:   Intra-op Plan:   Post-operative Plan: Extubation in OR  Informed Consent: I have reviewed the patients History and Physical, chart, labs and discussed the procedure including the risks, benefits and alternatives for the proposed anesthesia with the patient or authorized representative who has indicated his/her understanding and acceptance.   Dental advisory given  Plan Discussed with: CRNA, Anesthesiologist and Surgeon  Anesthesia Plan Comments:        Anesthesia Quick Evaluation

## 2014-02-26 DIAGNOSIS — M1711 Unilateral primary osteoarthritis, right knee: Secondary | ICD-10-CM | POA: Diagnosis not present

## 2014-02-26 NOTE — Anesthesia Postprocedure Evaluation (Signed)
  Anesthesia Post-op Note  Patient: Richard [name]  Procedure(s) Performed: Procedure(s): RIGHT KNEE ARTHROPLASTY CONDYLE AND PLATEAU MEDIAL COMPARTMENT  (Right)  Patient Location: PACU  Anesthesia Type: General with regional   Level of Consciousness: awake, alert  and oriented  Airway and Oxygen Therapy: Patient Spontanous Breathing  Post-op Pain: mild  Post-op Assessment: Post-op Vital signs reviewed  Post-op Vital Signs: Reviewed  Last Vitals:  Filed Vitals:   02/26/14 0845  BP:   Pulse: 71  Temp:   Resp:     Complications: No apparent anesthesia complications

## 2014-02-26 NOTE — Discharge Instructions (Addendum)
Diet: As you were doing prior to hospitalization   Shower:  May shower but keep the wounds dry, use an occlusive plastic wrap, NO SOAKING IN TUB.  If the bandage gets wet, change with a clean dry gauze.  Dressing:  You may change your dressing 3-5 days after surgery.  Then change the dressing daily with sterile gauze dressing.    There are sticky tapes (steri-strips) on your wounds and all the stitches are absorbable.  Leave the steri-strips in place when changing your dressings, they will peel off with time, usually 2-3 weeks.  Activity:  Increase activity slowly as tolerated, but follow the weight bearing instructions below.  No lifting or driving for 6 weeks.  Weight Bearing:   As tolerated.    To prevent constipation: you may use a stool softener such as -  Colace (over the counter) 100 mg by mouth twice a day  Drink plenty of fluids (prune juice may be helpful) and high fiber foods Miralax (over the counter) for constipation as needed.    Itching:  If you experience itching with your medications, try taking only a single pain pill, or even half a pain pill at a time.  You may take up to 10 pain pills per day, and you can also use benadryl over the counter for itching or also to help with sleep.   Precautions:  If you experience chest pain or shortness of breath - call 911 immediately for transfer to the hospital emergency department!!  If you develop a fever greater that 101 F, purulent drainage from wound, increased redness or drainage from wound, or calf pain -- Call the office at [phone]                                                Follow- Up Appointment:  Please call for an appointment to be seen in 2 weeks [place] - [phone]   Regional Anesthesia Blocks  1. Numbness or the inability to move the "blocked" extremity may last from 3-48 hours after placement. The length of time depends on the medication injected and your individual response to the medication. If the  numbness is not going away after 48 hours, call your surgeon.  2. The extremity that is blocked will need to be protected until the numbness is gone and the  Strength has returned. Because you cannot feel it, you will need to take extra care to avoid injury. Because it may be weak, you may have difficulty moving it or using it. You may not know what position it is in without looking at it while the block is in effect.  3. For blocks in the legs and feet, returning to weight bearing and walking needs to be done carefully. You will need to wait until the numbness is entirely gone and the strength has returned. You should be able to move your leg and foot normally before you try and bear weight or walk. You will need someone to be with you when you first try to ensure you do not fall and possibly risk injury.  4. Bruising and tenderness at the needle site are common side effects and will resolve in a few days.  5. Persistent numbness or new problems with movement should be communicated to the surgeon or the [facility] [phone] [place] [phone]).

## 2014-03-01 ENCOUNTER — Ambulatory Visit: Payer: Managed Care, Other (non HMO) | Admitting: Interventional Cardiology

## 2014-03-01 ENCOUNTER — Encounter (HOSPITAL_BASED_OUTPATIENT_CLINIC_OR_DEPARTMENT_OTHER): Payer: Self-pay | Admitting: Orthopedic Surgery

## 2014-03-11 ENCOUNTER — Other Ambulatory Visit: Payer: Self-pay | Admitting: *Deleted

## 2014-03-11 MED ORDER — FUROSEMIDE 40 MG PO TABS: 40.0000 mg | ORAL_TABLET | Freq: Every day | ORAL | Status: DC

## 2014-03-25 ENCOUNTER — Encounter: Payer: Self-pay | Admitting: Interventional Cardiology

## 2014-03-25 ENCOUNTER — Ambulatory Visit (INDEPENDENT_AMBULATORY_CARE_PROVIDER_SITE_OTHER): Payer: Managed Care, Other (non HMO) | Admitting: Interventional Cardiology

## 2014-03-25 VITALS — BP 122/82 | HR 80 | Ht 69.0 in | Wt 298.0 lb

## 2014-03-25 DIAGNOSIS — R0602 Shortness of breath: Secondary | ICD-10-CM

## 2014-03-25 DIAGNOSIS — I519 Heart disease, unspecified: Secondary | ICD-10-CM

## 2014-03-25 DIAGNOSIS — I5189 Other ill-defined heart diseases: Secondary | ICD-10-CM | POA: Insufficient documentation

## 2014-03-25 LAB — BASIC METABOLIC PANEL
BUN: 27 mg/dL — ABNORMAL HIGH (ref 6–23)
CO2: 29 mEq/L (ref 19–32)
Calcium: 9.7 mg/dL (ref 8.4–10.5)
Chloride: 104 mEq/L (ref 96–112)
Creatinine, Ser: 1.13 mg/dL (ref 0.40–1.50)
GFR: 72.12 mL/min (ref >60.00)
Glucose, Bld: 95 mg/dL (ref 70–99)
Potassium: 4 mEq/L (ref 3.5–5.1)
Sodium: 139 mEq/L (ref 135–145)

## 2014-03-25 MED ORDER — METOPROLOL SUCCINATE ER 25 MG PO TB24: 12.5000 mg | ORAL_TABLET | Freq: Two times a day (BID) | ORAL | Status: DC

## 2014-03-25 MED ORDER — FUROSEMIDE 40 MG PO TABS: 40.0000 mg | ORAL_TABLET | Freq: Every day | ORAL | Status: DC

## 2014-03-25 NOTE — Progress Notes (Signed)
Patient ID: Richard [name], male   DOB: 04/24/1960, 54 y.o.   MRN: [id number]    [address] [place], [place]  [address] Phone: [phone] Fax:  [phone]  Date:  03/25/2014   ID:  Richard [name], DOB 11/14/1960, MRN [id number]  PCP:  Webb Silversmith, NP      History of Present Illness: Richard [name] is a 54 y.o. male who had a false positive nuclear stress test. He underwent cardiac catheterization as part of a preoperative workup for total knee replacement. The cath was cleaned although he had an increased LVEDP. He was started on Lasix due to shortness of breath. He has had his surgery. He still has some knee pain and is going to rehabilitation. He feels better. He has lost 12 pounds. He feels that he benefits from the Lasix. If he does not take it, he notes fluid retention and swelling.   Wt Readings from Last 3 Encounters:  03/25/14 298 lb (135.172 kg)  02/25/14 310 lb (140.615 kg)  12/30/13 310 lb (140.615 kg)     Past Medical History  Diagnosis Date  . Obesity   . OSA (obstructive sleep apnea)   . Hypertension 08/17/2011  . Chest pain 08/16/2011    MI ruled out.  Marland Kitchen LVH (left ventricular hypertrophy) 08/17/2011    Per 2-D echocardiogram.  . GERD (gastroesophageal reflux disease) 08/17/2011  . Arthritis   . Urine incontinence   . Primary localized osteoarthritis of right knee 02/25/2014    Current Outpatient Prescriptions  Medication Sig Dispense Refill  . baclofen (LIORESAL) 10 MG tablet Take 1 tablet (10 mg total) by mouth 3 (three) times daily. As needed for muscle spasm 50 tablet 0  . esomeprazole (NEXIUM) 40 MG capsule Take 40 mg by mouth daily at 12 noon.    . furosemide (LASIX) 40 MG tablet Take 1 tablet (40 mg total) by mouth daily. 30 tablet 1  . metoprolol succinate (TOPROL XL) 25 MG 24 hr tablet Take 0.5 tablets (12.5 mg total) by mouth 2 (two) times daily. 45 tablet 3  . oxyCODONE-acetaminophen (PERCOCET) 10-325 MG per tablet  Take 1-2 tablets by mouth every 6 (six) hours as needed for pain. MAXIMUM TOTAL ACETAMINOPHEN DOSE IS 4000 MG PER DAY 75 tablet 0  . rivaroxaban (XARELTO) 10 MG TABS tablet Take 1 tablet (10 mg total) by mouth daily. 21 tablet 0  . aspirin EC 325 MG tablet Take 325 mg by mouth daily.  0   No current facility-administered medications for this visit.    Allergies:    Allergies  Allergen Reactions  . Penicillins Other (See Comments)    REACTION: UNKNOWN Received 3 Gms Ancef IV preop 2 with no obvious reaction.    Social History:  The patient  reports that he has never smoked. He has never used smokeless tobacco. He reports that he drinks alcohol. He reports that he does not use illicit drugs.   Family History:  The patient's family history includes Arthritis in his mother; Coronary artery disease in his father and mother; Hyperlipidemia in his mother; Hypertension in his mother.   ROS:  Please see the history of present illness.  No nausea, vomiting.  No fevers, chills.  No focal weakness.  No dysuria.  All other systems reviewed and negative.   PHYSICAL EXAM: VS:  BP 122/82 mmHg  Pulse 80  Ht 5\' 9"  (1.753 m)  Wt 298 lb (135.172 kg)  BMI 43.99 kg/m2 General:  Well developed, well nourished, in no acute distress HEENT: normal Neck: no JVD, no carotid bruits Cardiac:  normal S1, S2; RRR;  Lungs:  clear to auscultation bilaterally, no wheezing, rhonchi or rales Abd: soft, nontender, no hepatomegaly Ext: no edema Skin: warm and dry Neuro:   no focal abnormalities noted Psych: normal affect  EKG:  Normal sinus rhythm, no ST segment changes    ASSESSMENT AND PLAN:  1. Diastolic dysfunction: Likely cause of volume overload. Continue Lasix for now. Will check electrolytes. 2. Obesity: I encouraged him to continue to try to lose weight. He needs to try and give up soft drinks as much as possible. He has improved on this since his last visit.  Signed, Mina Marble, MD,  [facility] 03/25/2014 12:48 PM

## 2014-03-25 NOTE — Patient Instructions (Signed)
Your physician recommends that you have lab work today: BMP  Your physician wants you to follow-up in: 6 months with Dr. Irish Lack. You will receive a reminder letter in the mail two months in advance. If you don't receive a letter, please call our office to schedule the follow-up appointment.

## 2014-05-30 ENCOUNTER — Other Ambulatory Visit: Payer: Self-pay | Admitting: Interventional Cardiology

## 2014-07-06 ENCOUNTER — Emergency Department: Payer: Managed Care, Other (non HMO)

## 2014-07-06 ENCOUNTER — Other Ambulatory Visit: Payer: Self-pay

## 2014-07-06 ENCOUNTER — Emergency Department
Admission: EM | Admit: 2014-07-06 | Discharge: 2014-07-06 | Disposition: A | Discharge status: Home / Self Care | Payer: Managed Care, Other (non HMO) | Attending: Student | Admitting: Student

## 2014-07-06 DIAGNOSIS — Z880 Allergy status to penicillin: Secondary | ICD-10-CM | POA: Diagnosis not present

## 2014-07-06 DIAGNOSIS — R0602 Shortness of breath: Secondary | ICD-10-CM | POA: Diagnosis not present

## 2014-07-06 DIAGNOSIS — Z7901 Long term (current) use of anticoagulants: Secondary | ICD-10-CM | POA: Insufficient documentation

## 2014-07-06 DIAGNOSIS — Z7982 Long term (current) use of aspirin: Secondary | ICD-10-CM | POA: Insufficient documentation

## 2014-07-06 DIAGNOSIS — I10 Essential (primary) hypertension: Secondary | ICD-10-CM | POA: Insufficient documentation

## 2014-07-06 DIAGNOSIS — R0789 Other chest pain: Secondary | ICD-10-CM | POA: Diagnosis not present

## 2014-07-06 DIAGNOSIS — Z79899 Other long term (current) drug therapy: Secondary | ICD-10-CM | POA: Diagnosis not present

## 2014-07-06 DIAGNOSIS — R079 Chest pain, unspecified: Secondary | ICD-10-CM

## 2014-07-06 LAB — BASIC METABOLIC PANEL
Anion gap: 6 (ref 5–15)
BUN: 20 mg/dL (ref 6–20)
CO2: 28 mmol/L (ref 22–32)
Calcium: 9 mg/dL (ref 8.9–10.3)
Chloride: 106 mmol/L (ref 101–111)
Creatinine, Ser: 1.1 mg/dL (ref 0.61–1.24)
GFR calc Af Amer: >60 mL/min (ref >60)
GFR calc non Af Amer: >60 mL/min (ref >60)
Glucose, Bld: 104 mg/dL — ABNORMAL HIGH (ref 65–99)
Potassium: 4.2 mmol/L (ref 3.5–5.1)
Sodium: 140 mmol/L (ref 135–145)

## 2014-07-06 LAB — CBC
HCT: 45.7 % (ref 40.0–52.0)
Hemoglobin: 15.3 g/dL (ref 13.0–18.0)
MCH: 29.3 pg (ref 26.0–34.0)
MCHC: 33.5 g/dL (ref 32.0–36.0)
MCV: 87.3 fL (ref 80.0–100.0)
Platelets: 189 10*3/uL (ref 150–440)
RBC: 5.24 MIL/uL (ref 4.40–5.90)
RDW: 14 % (ref 11.5–14.5)
WBC: 8.1 10*3/uL (ref 3.8–10.6)

## 2014-07-06 LAB — TROPONIN I
Troponin I: <0.03 ng/mL (ref <0.031)
Troponin I: <0.03 ng/mL (ref <0.031)

## 2014-07-06 LAB — FIBRIN DERIVATIVES D-DIMER (ARMC ONLY): Fibrin derivatives D-dimer (ARMC): 319 (ref 0–499)

## 2014-07-06 MED ORDER — GI COCKTAIL ~~LOC~~
ORAL | Status: AC
  Administered 2014-07-06: 30 mL via ORAL
  Filled 2014-07-06: qty 30

## 2014-07-06 MED ORDER — GI COCKTAIL ~~LOC~~
30.0000 mL | Freq: Once | ORAL | Status: AC
  Administered 2014-07-06: 30 mL via ORAL

## 2014-07-06 MED ORDER — NITROGLYCERIN 0.4 MG SL SUBL
0.4000 mg | SUBLINGUAL_TABLET | SUBLINGUAL | Status: DC | PRN
  Administered 2014-07-06: 0.4 mg via SUBLINGUAL

## 2014-07-06 MED ORDER — ACETAMINOPHEN 500 MG PO TABS
1000.0000 mg | ORAL_TABLET | Freq: Once | ORAL | Status: AC
  Administered 2014-07-06: 1000 mg via ORAL

## 2014-07-06 MED ORDER — ACETAMINOPHEN 500 MG PO TABS
ORAL_TABLET | ORAL | Status: AC
  Administered 2014-07-06: 1000 mg via ORAL
  Filled 2014-07-06: qty 2

## 2014-07-06 MED ORDER — NITROGLYCERIN 0.4 MG SL SUBL
SUBLINGUAL_TABLET | SUBLINGUAL | Status: AC
  Administered 2014-07-06: 0.4 mg via SUBLINGUAL
  Filled 2014-07-06: qty 1

## 2014-07-06 MED ORDER — TRAMADOL HCL 50 MG PO TABS
ORAL_TABLET | ORAL | Status: AC
Start: 2014-07-06 — End: 2014-07-06
  Administered 2014-07-06: 50 mg via ORAL
  Filled 2014-07-06: qty 1

## 2014-07-06 MED ORDER — TRAMADOL HCL 50 MG PO TABS
50.0000 mg | ORAL_TABLET | Freq: Once | ORAL | Status: AC
  Administered 2014-07-06: 50 mg via ORAL

## 2014-07-06 NOTE — ED Provider Notes (Signed)
[facility] Emergency Department Provider Note  ____________________________________________  Time seen: Approximately 938 AM  I have reviewed the triage vital signs and the nursing notes.   HISTORY  Chief Complaint Chest Pain    HPI Richard [name] is a 54 y.o. male who comes in tonight with left-sided chest pain. The patient reports that he was sleeping and woke up with pain in his chest. He reports that this sharp burning pain on the left side is worse with deep inspiration. He denies any radiation of the pain. He denies nausea vomiting or sweats. He has never had this pain before. When he woke up he did not take anything for the pain but he did take his regular medicines earlier today including aspirin. The patient denies any dizziness but does have some left-sided leg swelling which he reports he's had since his knee replacement. The patient's pain is a 6 out of 10 in intensity.   Past Medical History  Diagnosis Date  . Obesity   . OSA (obstructive sleep apnea)   . Hypertension 08/17/2011  . Chest pain 08/16/2011    MI ruled out.  Marland Kitchen LVH (left ventricular hypertrophy) 08/17/2011    Per 2-D echocardiogram.  . GERD (gastroesophageal reflux disease) 08/17/2011  . Arthritis   . Urine incontinence   . Primary localized osteoarthritis of right knee 02/25/2014    Patient Active Problem List   Diagnosis Date Noted  . Diastolic dysfunction 18/29/9371  . Primary localized osteoarthritis of right knee 02/25/2014  . S/P right unicompartmental knee replacement 02/25/2014  . Abnormal nuclear stress test   . Arthritis 03/16/2013  . Hypertension 08/17/2011  . LVH (left ventricular hypertrophy) 08/17/2011  . GERD (gastroesophageal reflux disease) 08/17/2011  . Morbid obesity 08/16/2011  . OSA (obstructive sleep apnea) 08/16/2011    Past Surgical History  Procedure Laterality Date  . Appendectomy    . Orthopedic surgery    . Left heart catheterization  with coronary angiogram N/A 01/12/2014    Procedure: LEFT HEART CATHETERIZATION WITH CORONARY ANGIOGRAM;  Surgeon: Jettie Booze, MD;  Location: [facility] CATH LAB;  Service: Cardiovascular;  Laterality: N/A;  . Partial knee arthroplasty Right 02/25/2014    Procedure: RIGHT KNEE ARTHROPLASTY CONDYLE AND PLATEAU MEDIAL COMPARTMENT ;  Surgeon: Johnny Bridge, MD;  Location: [facility];  Service: Orthopedics;  Laterality: Right;    Current Outpatient Rx  Name  Route  Sig  Dispense  Refill  . aspirin EC 325 MG tablet   Oral   Take 325 mg by mouth daily.      0   . baclofen (LIORESAL) 10 MG tablet   Oral   Take 1 tablet (10 mg total) by mouth 3 (three) times daily. As needed for muscle spasm   50 tablet   0   . esomeprazole (NEXIUM) 40 MG capsule   Oral   Take 40 mg by mouth daily at 12 noon.         . furosemide (LASIX) 40 MG tablet   Oral   Take 1 tablet (40 mg total) by mouth daily.   90 tablet   3   . metoprolol succinate (TOPROL XL) 25 MG 24 hr tablet   Oral   Take 0.5 tablets (12.5 mg total) by mouth 2 (two) times daily.   90 tablet   1   . oxyCODONE-acetaminophen (PERCOCET) 10-325 MG per tablet   Oral   Take 1-2 tablets by mouth every 6 (six) hours as  needed for pain. MAXIMUM TOTAL ACETAMINOPHEN DOSE IS 4000 MG PER DAY   75 tablet   0   . rivaroxaban (XARELTO) 10 MG TABS tablet   Oral   Take 1 tablet (10 mg total) by mouth daily.   21 tablet   0     Allergies Penicillins  Family History  Problem Relation Age of Onset  . Coronary artery disease Mother   . Hyperlipidemia Mother   . Hypertension Mother   . Arthritis Mother   . Coronary artery disease Father     Social History History  Substance Use Topics  . Smoking status: Never Smoker   . Smokeless tobacco: Never Used  . Alcohol Use: Yes     Comment: occassional    Review of Systems Constitutional: No fever/chills Eyes: No visual changes. ENT: No sore throat. Cardiovascular:  chest pain. Respiratory: shortness of breath. Gastrointestinal: No abdominal pain.  No nausea, no vomiting.   Genitourinary: Negative for dysuria. Musculoskeletal: Negative for back pain. Skin: Negative for rash. Neurological: Negative for headaches, focal weakness or numbness.  10-point ROS otherwise negative.  ____________________________________________   PHYSICAL EXAM:  VITAL SIGNS: ED Triage Vitals  Enc Vitals Group     BP 07/06/14 0515 137/81 mmHg     Pulse Rate 07/06/14 0515 65     Resp 07/06/14 0515 18     Temp 07/06/14 0515 97.9 F (36.6 C)     Temp Source 07/06/14 0515 Oral     SpO2 07/06/14 0515 95 %     Weight 07/06/14 0515 306 lb (138.801 kg)     Height 07/06/14 0515 5\' 9"  (1.753 m)     Head Cir --      Peak Flow --      Pain Score 07/06/14 0515 8     Pain Loc --      Pain Edu? --      Excl. in [place]? --     Constitutional: Alert and oriented. Well appearing and in no acute distress. Eyes: Conjunctivae are normal. PERRL. EOMI. Head: Atraumatic. Nose: No congestion/rhinnorhea. Mouth/Throat: Mucous membranes are moist.  Oropharynx non-erythematous. Cardiovascular: Normal rate, regular rhythm. Grossly normal heart sounds.  Good peripheral circulation. Respiratory: Normal respiratory effort.  No retractions. Lungs CTAB. Gastrointestinal: Soft and nontender. No distention. Positive bowel sounds Genitourinary: deferred Musculoskeletal: No lower extremity tenderness nor edema.   Neurologic:  Normal speech and language. No gross focal neurologic deficits are appreciated.  Skin:  Skin is warm, dry and intact. No rash noted. Psychiatric: Mood and affect are normal. Speech and behavior are normal.  ____________________________________________   LABS (all labs ordered are listed, but only abnormal results are displayed)  Labs Reviewed  BASIC METABOLIC PANEL - Abnormal; Notable for the following:    Glucose, Bld 104 (*)    All other components within normal  limits  CBC  TROPONIN I  FIBRIN DERIVATIVES D-DIMER ([facility] ONLY)   ____________________________________________  EKG  ED ECG REPORT I, Loney Hering, the attending physician, personally viewed and interpreted this ECG.   Date: 07/06/2014  EKG Time: 519  Rate: 65  Rhythm: normal sinus rhythm  Axis: normal  Intervals:none  ST&T Change: None  ____________________________________________  RADIOLOGY  Chest x-ray: No active disease ____________________________________________   PROCEDURES  Procedure(s) performed: None  Critical Care performed: No  ____________________________________________   INITIAL IMPRESSION / ASSESSMENT AND PLAN / ED COURSE  Pertinent labs & imaging results that were available during my care of the patient were  reviewed by me and considered in my medical decision making (see chart for details).  This is a 54 year old male who comes in with chest pain tonight. He reports that the pain is worse when he takes a deep breath in. I will perform a d-dimer to evaluate for possible elevated d-dimer. His initial blood work is negative as is his chest x-ray. I will also repeat the patient's enzymes to determine if there is any range and his enzymes. The patient receive a GI cocktail and some glycerin for pain and he'll be reassessed.  The patient's care will be signed out to Dr. Edd Fabian who will follow-up the results of the repeat troponin. ____________________________________________   FINAL CLINICAL IMPRESSION(S) / ED DIAGNOSES  Final diagnoses:  None      Loney Hering, MD 07/06/14 [phone]

## 2014-07-06 NOTE — ED Provider Notes (Signed)
-----------------------------------------   7:26 AM on 07/06/2014 -----------------------------------------  Assuming care from Dr. Dahlia Client.  In short, Richard [name] is a 54 y.o. male with a chief complaint of Chest Pain .  Refer to the original H&P for additional details.  The current plan of care is to repeat troponin, anticipate discharge if negative. Negative cath December 2015. D-dimer not elevated.  ----------------------------------------- 9:33 AM on 07/06/2014 -----------------------------------------  Second troponin negative.vital signs stable. Doubt ACS. Discussed return precautions and close PCP follow-up. Patient is in agreement with discharge plan.   Joanne Gavel, MD 07/06/14 [phone]

## 2014-07-06 NOTE — ED Notes (Signed)
Pt informed to return if any life threatening symptoms occur.  

## 2014-07-06 NOTE — ED Notes (Signed)
Pt to room 26 via w/c with no distress noted; pt reports awoke at 4am with mid CP, nonradiating, no accomp symptoms; denies hx of same

## 2014-07-08 ENCOUNTER — Other Ambulatory Visit: Payer: Self-pay | Admitting: Internal Medicine

## 2014-07-11 ENCOUNTER — Emergency Department
Admission: EM | Admit: 2014-07-11 | Discharge: 2014-07-12 | Disposition: A | Discharge status: Home / Self Care | Payer: Managed Care, Other (non HMO) | Attending: Emergency Medicine | Admitting: Emergency Medicine

## 2014-07-11 ENCOUNTER — Encounter: Payer: Self-pay | Admitting: *Deleted

## 2014-07-11 DIAGNOSIS — I10 Essential (primary) hypertension: Secondary | ICD-10-CM | POA: Insufficient documentation

## 2014-07-11 DIAGNOSIS — Y9389 Activity, other specified: Secondary | ICD-10-CM | POA: Diagnosis not present

## 2014-07-11 DIAGNOSIS — X58XXXA Exposure to other specified factors, initial encounter: Secondary | ICD-10-CM | POA: Insufficient documentation

## 2014-07-11 DIAGNOSIS — Z79899 Other long term (current) drug therapy: Secondary | ICD-10-CM | POA: Insufficient documentation

## 2014-07-11 DIAGNOSIS — Y9289 Other specified places as the place of occurrence of the external cause: Secondary | ICD-10-CM | POA: Diagnosis not present

## 2014-07-11 DIAGNOSIS — Y998 Other external cause status: Secondary | ICD-10-CM | POA: Insufficient documentation

## 2014-07-11 DIAGNOSIS — Z7982 Long term (current) use of aspirin: Secondary | ICD-10-CM | POA: Insufficient documentation

## 2014-07-11 DIAGNOSIS — L500 Allergic urticaria: Secondary | ICD-10-CM | POA: Diagnosis not present

## 2014-07-11 DIAGNOSIS — Z7901 Long term (current) use of anticoagulants: Secondary | ICD-10-CM | POA: Diagnosis not present

## 2014-07-11 DIAGNOSIS — Z880 Allergy status to penicillin: Secondary | ICD-10-CM | POA: Insufficient documentation

## 2014-07-11 DIAGNOSIS — T7840XA Allergy, unspecified, initial encounter: Secondary | ICD-10-CM | POA: Insufficient documentation

## 2014-07-11 LAB — CBC
HCT: 45 % (ref 40.0–52.0)
Hemoglobin: 15 g/dL (ref 13.0–18.0)
MCH: 29.3 pg (ref 26.0–34.0)
MCHC: 33.3 g/dL (ref 32.0–36.0)
MCV: 88 fL (ref 80.0–100.0)
Platelets: 168 10*3/uL (ref 150–440)
RBC: 5.11 MIL/uL (ref 4.40–5.90)
RDW: 14.1 % (ref 11.5–14.5)
WBC: 9.9 10*3/uL (ref 3.8–10.6)

## 2014-07-11 LAB — BASIC METABOLIC PANEL
Anion gap: 7 (ref 5–15)
BUN: 22 mg/dL — ABNORMAL HIGH (ref 6–20)
CO2: 27 mmol/L (ref 22–32)
Calcium: 8.6 mg/dL — ABNORMAL LOW (ref 8.9–10.3)
Chloride: 106 mmol/L (ref 101–111)
Creatinine, Ser: 1.19 mg/dL (ref 0.61–1.24)
GFR calc Af Amer: >60 mL/min (ref >60)
GFR calc non Af Amer: >60 mL/min (ref >60)
Glucose, Bld: 140 mg/dL — ABNORMAL HIGH (ref 65–99)
Potassium: 3.9 mmol/L (ref 3.5–5.1)
Sodium: 140 mmol/L (ref 135–145)

## 2014-07-11 MED ORDER — FAMOTIDINE IN NACL 20-0.9 MG/50ML-% IV SOLN
20.0000 mg | Freq: Once | INTRAVENOUS | Status: AC
  Administered 2014-07-11: 20 mg via INTRAVENOUS

## 2014-07-11 MED ORDER — FAMOTIDINE IN NACL 20-0.9 MG/50ML-% IV SOLN
INTRAVENOUS | Status: AC
Start: 2014-07-11 — End: 2014-07-11
  Administered 2014-07-11: 20 mg via INTRAVENOUS
  Filled 2014-07-11: qty 50

## 2014-07-11 MED ORDER — PREDNISONE 20 MG PO TABS: 40.0000 mg | ORAL_TABLET | Freq: Every day | ORAL | Status: DC

## 2014-07-11 MED ORDER — METHYLPREDNISOLONE SODIUM SUCC 125 MG IJ SOLR
125.0000 mg | Freq: Once | INTRAMUSCULAR | Status: AC
Start: 2014-07-11 — End: 2014-07-11
  Administered 2014-07-11: 125 mg via INTRAVENOUS

## 2014-07-11 MED ORDER — METHYLPREDNISOLONE SODIUM SUCC 125 MG IJ SOLR
INTRAMUSCULAR | Status: AC
  Administered 2014-07-11: 125 mg via INTRAVENOUS
  Filled 2014-07-11: qty 2

## 2014-07-11 MED ORDER — DIPHENHYDRAMINE HCL 50 MG/ML IJ SOLN
50.0000 mg | Freq: Once | INTRAMUSCULAR | Status: AC
  Administered 2014-07-11: 50 mg via INTRAVENOUS

## 2014-07-11 MED ORDER — SODIUM CHLORIDE 0.9 % IV BOLUS (SEPSIS)
1000.0000 mL | Freq: Once | INTRAVENOUS | Status: AC
  Administered 2014-07-11: 1000 mL via INTRAVENOUS

## 2014-07-11 MED ORDER — DIPHENHYDRAMINE HCL 50 MG/ML IJ SOLN
INTRAMUSCULAR | Status: AC
  Administered 2014-07-11: 50 mg via INTRAVENOUS
  Filled 2014-07-11: qty 1

## 2014-07-11 NOTE — Discharge Instructions (Signed)
Allergies °Allergies may happen from anything your body is sensitive to. This may be food, medicines, pollens, chemicals, and nearly anything around you in everyday life that produces allergens. An allergen is anything that causes an allergy producing substance. Heredity is often a factor in causing these problems. This means you may have some of the same allergies as your parents. °Food allergies happen in all age groups. Food allergies are some of the most severe and life threatening. Some common food allergies are cow's milk, seafood, eggs, nuts, wheat, and soybeans. °SYMPTOMS  °· Swelling around the mouth. °· An itchy red rash or hives. °· Vomiting or diarrhea. °· Difficulty breathing. °SEVERE ALLERGIC REACTIONS ARE LIFE-THREATENING. °This reaction is called anaphylaxis. It can cause the mouth and throat to swell and cause difficulty with breathing and swallowing. In severe reactions only a trace amount of food (for example, peanut oil in a salad) may cause death within seconds. °Seasonal allergies occur in all age groups. These are seasonal because they usually occur during the same season every year. They may be a reaction to molds, grass pollens, or tree pollens. Other causes of problems are house dust mite allergens, pet dander, and mold spores. The symptoms often consist of nasal congestion, a runny itchy nose associated with sneezing, and tearing itchy eyes. There is often an associated itching of the mouth and ears. The problems happen when you come in contact with pollens and other allergens. Allergens are the particles in the air that the body reacts to with an allergic reaction. This causes you to release allergic antibodies. Through a chain of events, these eventually cause you to release histamine into the blood stream. Although it is meant to be protective to the body, it is this release that causes your discomfort. This is why you were given anti-histamines to feel better.  If you are unable to  pinpoint the offending allergen, it may be determined by skin or blood testing. Allergies cannot be cured but can be controlled with medicine. °Hay fever is a collection of all or some of the seasonal allergy problems. It may often be treated with simple over-the-counter medicine such as diphenhydramine. Take medicine as directed. Do not drink alcohol or drive while taking this medicine. Check with your caregiver or package insert for child dosages. °If these medicines are not effective, there are many new medicines your caregiver can prescribe. Stronger medicine such as nasal spray, eye drops, and corticosteroids may be used if the first things you try do not work well. Other treatments such as immunotherapy or desensitizing injections can be used if all else fails. Follow up with your caregiver if problems continue. These seasonal allergies are usually not life threatening. They are generally more of a nuisance that can often be handled using medicine. °HOME CARE INSTRUCTIONS  °· If unsure what causes a reaction, keep a diary of foods eaten and symptoms that follow. Avoid foods that cause reactions. °· If hives or rash are present: °¨ Take medicine as directed. °¨ You may use an over-the-counter antihistamine (diphenhydramine) for hives and itching as needed. °¨ Apply cold compresses (cloths) to the skin or take baths in cool water. Avoid hot baths or showers. Heat will make a rash and itching worse. °· If you are severely allergic: °¨ Following a treatment for a severe reaction, hospitalization is often required for closer follow-up. °¨ Wear a medic-alert bracelet or necklace stating the allergy. °¨ You and your family must learn how to give adrenaline or use   an anaphylaxis kit.  If you have had a severe reaction, always carry your anaphylaxis kit or EpiPen with you. Use this medicine as directed by your caregiver if a severe reaction is occurring. Failure to do so could have a fatal outcome. SEEK MEDICAL  CARE IF:  You suspect a food allergy. Symptoms generally happen within 30 minutes of eating a food.  Your symptoms have not gone away within 2 days or are getting worse.  You develop new symptoms.  You want to retest yourself or your child with a food or drink you think causes an allergic reaction. Never do this if an anaphylactic reaction to that food or drink has happened before. Only do this under the care of a caregiver. SEEK IMMEDIATE MEDICAL CARE IF:   You have difficulty breathing, are wheezing, or have a tight feeling in your chest or throat.  You have a swollen mouth, or you have hives, swelling, or itching all over your body.  You have had a severe reaction that has responded to your anaphylaxis kit or an EpiPen. These reactions may return when the medicine has worn off. These reactions should be considered life threatening. MAKE SURE YOU:   Understand these instructions.  Will watch your condition.  Will get help right away if you are not doing well or get worse. Document Released: 04/02/2002 Document Revised: 05/04/2012 Document Reviewed: 09/07/2007 [facility] Patient Information 2015 [place], [place]. This information is not intended to replace advice given to you by your health care provider. Make sure you discuss any questions you have with your health care provider.     As we have discussed please take 50 mg of Benadryl every 6 hours as needed for hives or itching. Please take your entire course of prednisone, as prescribed. Please follow up with your primary care doctor within the next 2 days for reevaluation. Please return immediately to the emergency department for any swelling in the mouth, any difficulty breathing, or any other symptom personally concerning to yourself.

## 2014-07-11 NOTE — ED Notes (Signed)
Pt arrives with swellin to eyes, lips, and tongue, pt states knots on the back of his head, and hives on arms, pt able to speak in full sentances

## 2014-07-11 NOTE — ED Provider Notes (Signed)
[facility] Emergency Department Provider Note  Time seen: 10:05 PM  I have reviewed the triage vital signs and the nursing notes.   HISTORY  Chief Complaint Allergic Reaction    HPI Richard [name] is a 54 y.o. male with a past medical history of hypertension, gastric reflux, arthritis who presents to the emergency department with swelling to his left, eyelids, and itching to his bilateral upper extremities. Patient states 2 days ago he noticed hives on both of his upper extremities, and they went away on their own. Last night he noted hives once again. Today he states significant swelling mostly to the right side of the lips, and bilateral eyelids. He states his head is very itchy, along with his upper extremities. Denies any trouble breathing. Describes his symptoms as moderate. Patient denies any new foods, detergents, or medications. Patient does have an allergy to penicillin, but no other medications that he is aware of. Denies taking any lisinopril or enalapril for his blood pressure.    Past Medical History  Diagnosis Date  . Obesity   . OSA (obstructive sleep apnea)   . Hypertension 08/17/2011  . Chest pain 08/16/2011    MI ruled out.  Marland Kitchen LVH (left ventricular hypertrophy) 08/17/2011    Per 2-D echocardiogram.  . GERD (gastroesophageal reflux disease) 08/17/2011  . Arthritis   . Urine incontinence   . Primary localized osteoarthritis of right knee 02/25/2014    Patient Active Problem List   Diagnosis Date Noted  . Diastolic dysfunction 40/97/3532  . Primary localized osteoarthritis of right knee 02/25/2014  . S/P right unicompartmental knee replacement 02/25/2014  . Abnormal nuclear stress test   . Arthritis 03/16/2013  . Hypertension 08/17/2011  . LVH (left ventricular hypertrophy) 08/17/2011  . GERD (gastroesophageal reflux disease) 08/17/2011  . Morbid obesity 08/16/2011  . OSA (obstructive sleep apnea) 08/16/2011    Past Surgical  History  Procedure Laterality Date  . Appendectomy    . Orthopedic surgery    . Left heart catheterization with coronary angiogram N/A 01/12/2014    Procedure: LEFT HEART CATHETERIZATION WITH CORONARY ANGIOGRAM;  Surgeon: Jettie Booze, MD;  Location: [facility] CATH LAB;  Service: Cardiovascular;  Laterality: N/A;  . Partial knee arthroplasty Right 02/25/2014    Procedure: RIGHT KNEE ARTHROPLASTY CONDYLE AND PLATEAU MEDIAL COMPARTMENT ;  Surgeon: Johnny Bridge, MD;  Location: [facility];  Service: Orthopedics;  Laterality: Right;    Current Outpatient Rx  Name  Route  Sig  Dispense  Refill  . aspirin EC 325 MG tablet   Oral   Take 325 mg by mouth daily.      0   . baclofen (LIORESAL) 10 MG tablet   Oral   Take 1 tablet (10 mg total) by mouth 3 (three) times daily. As needed for muscle spasm   50 tablet   0   . esomeprazole (NEXIUM) 40 MG capsule   Oral   Take 40 mg by mouth daily at 12 noon.         . metoprolol succinate (TOPROL XL) 25 MG 24 hr tablet   Oral   Take 0.5 tablets (12.5 mg total) by mouth 2 (two) times daily.   90 tablet   1   . metoprolol succinate (TOPROL-XL) 25 MG 24 hr tablet      TAKE 1/2 TABLET BY MOUTH TWICE A DAY   90 tablet   3   . oxyCODONE-acetaminophen (PERCOCET) 10-325 MG per tablet  Oral   Take 1-2 tablets by mouth every 6 (six) hours as needed for pain. MAXIMUM TOTAL ACETAMINOPHEN DOSE IS 4000 MG PER DAY   75 tablet   0   . rivaroxaban (XARELTO) 10 MG TABS tablet   Oral   Take 1 tablet (10 mg total) by mouth daily.   21 tablet   0     Allergies Penicillins  Family History  Problem Relation Age of Onset  . Coronary artery disease Mother   . Hyperlipidemia Mother   . Hypertension Mother   . Arthritis Mother   . Coronary artery disease Father     Social History History  Substance Use Topics  . Smoking status: Never Smoker   . Smokeless tobacco: Never Used  . Alcohol Use: Yes     Comment: occassional     Review of Systems Constitutional: Negative for fever. Eyes: Eyelid swelling ENT: Swelling of the right side of his lips, no tongue swelling. Cardiovascular: Negative for chest pain. Respiratory: Negative for shortness of breath. Gastrointestinal: Negative for abdominal pain, nausea or vomiting. Musculoskeletal: Negative for back pain. Skin: Hives to bilateral upper extremities.  10-point ROS otherwise negative.  ____________________________________________   PHYSICAL EXAM:  VITAL SIGNS: ED Triage Vitals  Enc Vitals Group     BP 07/11/14 2203 155/79 mmHg     Pulse Rate 07/11/14 2203 77     Resp 07/11/14 2203 18     Temp 07/11/14 2203 98.8 F (37.1 C)     Temp Source 07/11/14 2203 Oral     SpO2 07/11/14 2203 96 %     Weight 07/11/14 2203 307 lb (139.254 kg)     Height 07/11/14 2203 5\' 9"  (1.753 m)     Head Cir --      Peak Flow --      Pain Score --      Pain Loc --      Pain Edu? --      Excl. in [place]? --     Constitutional: Alert and oriented. Well appearing and in no distress. Eyes: Moderate eyelid swelling, with erythema. No conjunctival erythema/injection, normal pupils. ENT   Head: Normocephalic and atraumatic.   Mouth/Throat: Mucous membranes are moist. Moderate swelling to the right side of his upper and lower lip, normal tongue, normal oral pharynx Cardiovascular: Normal rate, regular rhythm. No murmurs Respiratory: Normal respiratory effort without tachypnea nor retractions. Breath sounds are clear  Gastrointestinal: Soft and nontender. No distention.  Musculoskeletal: Nontender with normal range of motion in all extremities. Hives present on bilateral proximal upper extremities. Neurologic:  Normal speech and language. No gross focal neurologic deficits are appreciated. Speech is normal. Skin:  Hives on bilateral upper extremities as noted above. Psychiatric: Mood and affect are normal. Speech and behavior are normal.    ____________________________________________    INITIAL IMPRESSION / ASSESSMENT AND PLAN / ED COURSE  Pertinent labs & imaging results that were available during my care of the patient were reviewed by me and considered in my medical decision making (see chart for details).  Patient with likely allergic reaction to unknown antigen. We will start the patient on IV Benadryl, Solu-Medrol, Pepcid, IV hydrate, and closely monitor for improvement in the emergency department. Patient does not appear to be on an ACE inhibitor, do not suspect angioedema.  ----------------------------------------- 10:59 PM on 07/11/2014 -----------------------------------------  Patient appears somewhat improved, we'll continue to monitor in the emergency department. If patient continues to improve with discharge home with prednisone and  Benadryl as needed, I discussed this plan of care with the patient and his wife were agreeable. Patient care signed out to Dr. Dahlia Client.  ____________________________________________   FINAL CLINICAL IMPRESSION(S) / ED DIAGNOSES  Allergic reaction   Harvest Dark, MD 07/11/14 2259

## 2014-07-12 NOTE — ED Provider Notes (Signed)
-----------------------------------------   1:28 AM on 07/12/2014 -----------------------------------------  Assuming care from Dr. Kerman Passey.  In short, Richard [name] is a 54 y.o. male with a chief complaint of Allergic Reaction .  Refer to the original H&P for additional details.  The current plan of care is to monitor after medication and discharge if patient symptoms improving or not worsening.  The patient does still have some swelling around his eyes as well as swelling around his lips but he reports he does feel improved. The patient's family did show me how he looked initially and he is significantly improved. The hives have improved as well as the swelling. The patient reports that he does feel comfortable and ready to go home. The patient will be given some prednisone for home as well as some Benadryl and he should follow-up with his doctor to arrange some allergy testing to determine what may have caused the symptoms. The patient will return should the symptoms worsen or he develop any other symptoms.   Loney Hering, MD 07/12/14 [phone]

## 2014-09-19 ENCOUNTER — Telehealth: Payer: Self-pay | Admitting: Interventional Cardiology

## 2014-09-19 NOTE — Telephone Encounter (Signed)
Left message to call back  

## 2014-09-19 NOTE — Telephone Encounter (Signed)
New Message       Pt calling stating that he wants to know what the results of his tests were from last year because he is still having some issues. Pt doesn't know which tests. Please call back and advise.

## 2014-09-20 ENCOUNTER — Encounter: Payer: Self-pay | Admitting: Internal Medicine

## 2014-09-20 ENCOUNTER — Ambulatory Visit (INDEPENDENT_AMBULATORY_CARE_PROVIDER_SITE_OTHER): Payer: Managed Care, Other (non HMO) | Admitting: Internal Medicine

## 2014-09-20 VITALS — BP 134/82 | HR 74 | Temp 97.9°F | Wt 313.0 lb

## 2014-09-20 DIAGNOSIS — R0609 Other forms of dyspnea: Secondary | ICD-10-CM

## 2014-09-20 DIAGNOSIS — M25561 Pain in right knee: Secondary | ICD-10-CM

## 2014-09-20 DIAGNOSIS — M25562 Pain in left knee: Secondary | ICD-10-CM

## 2014-09-20 DIAGNOSIS — R0600 Dyspnea, unspecified: Secondary | ICD-10-CM

## 2014-09-20 MED ORDER — TRAMADOL HCL 50 MG PO TABS: 50.0000 mg | ORAL_TABLET | Freq: Two times a day (BID) | ORAL | Status: DC

## 2014-09-20 NOTE — Progress Notes (Signed)
Pre visit review using our clinic review tool, if applicable. No additional management support is needed unless otherwise documented below in the visit note. 

## 2014-09-20 NOTE — Progress Notes (Signed)
Subjective:    Patient ID: Richard [name], male    DOB: 1961-01-18, 54 y.o.   MRN: [id number]  HPI  Pt presents to the clinic today with c/o bilateral knee pain and shortness of breath.  He is s/p total knee replacement in February of his right knee and says that he continues to have achy joint pain in both knees.  He says he has trouble walking, getting out of the shower and off the toilet.  He currently takes Meloxicam 15 mg daily with very minimal relief. He will be having the left knee replaced but a date has not been scheduled by his orthopedist. Patient also endorses that he is short of breath with exertion and cannot walk as far as he used to.  He has steadily gained weight over the past 6 months and says it is due to his inability to remain active.  Patient had a left heart cath in January in which his coronary arteries were clean and his EF was 65%.  Patient has follow up appointment with cardiology in 3 weeks.     Review of Systems     Past Medical History  Diagnosis Date  . Obesity   . OSA (obstructive sleep apnea)   . Hypertension 08/17/2011  . Chest pain 08/16/2011    MI ruled out.  Marland Kitchen LVH (left ventricular hypertrophy) 08/17/2011    Per 2-D echocardiogram.  . GERD (gastroesophageal reflux disease) 08/17/2011  . Arthritis   . Urine incontinence   . Primary localized osteoarthritis of right knee 02/25/2014    Current Outpatient Prescriptions  Medication Sig Dispense Refill  . aspirin EC 325 MG tablet Take 325 mg by mouth daily.  0  . baclofen (LIORESAL) 10 MG tablet Take 1 tablet (10 mg total) by mouth 3 (three) times daily. As needed for muscle spasm 50 tablet 0  . esomeprazole (NEXIUM) 40 MG capsule Take 40 mg by mouth daily at 12 noon.    . metoprolol succinate (TOPROL-XL) 25 MG 24 hr tablet TAKE 1/2 TABLET BY MOUTH TWICE A DAY 90 tablet 3  . oxyCODONE-acetaminophen (PERCOCET) 10-325 MG per tablet Take 1-2 tablets by mouth every 6 (six) hours as needed for pain.  MAXIMUM TOTAL ACETAMINOPHEN DOSE IS 4000 MG PER DAY 75 tablet 0  . rivaroxaban (XARELTO) 10 MG TABS tablet Take 1 tablet (10 mg total) by mouth daily. 21 tablet 0   No current facility-administered medications for this visit.    Allergies  Allergen Reactions  . Penicillins Other (See Comments)    REACTION: UNKNOWN Received 3 Gms Ancef IV preop 2 with no obvious reaction.    Family History  Problem Relation Age of Onset  . Coronary artery disease Mother   . Hyperlipidemia Mother   . Hypertension Mother   . Arthritis Mother   . Coronary artery disease Father     Social History   Social History  . Marital Status: Married    Spouse Name: N/A  . Number of Children: N/A  . Years of Education: N/A   Occupational History  . tiler    Social History Main Topics  . Smoking status: Never Smoker   . Smokeless tobacco: Never Used  . Alcohol Use: Yes     Comment: occassional  . Drug Use: No  . Sexual Activity: Yes   Other Topics Concern  . Not on file   Social History Narrative     Constitutional: Pt reports fatigue. Denies fever, malaise, headache or abrupt  weight changes.  Respiratory: Pt reports shortness of breath. Denies difficulty breathing, cough or sputum production.   Cardiovascular: Pt reports chest tightness. Denies chest pain, palpitations or swelling in the hands or feet.  Musculoskeletal: Pt reports bilateral knee pain. Denies muscle pain.   No other specific complaints in a complete review of systems (except as listed in HPI above).  Objective:   Physical Exam  BP 134/82 mmHg  Pulse 74  Temp(Src) 97.9 F (36.6 C) (Oral)  Wt 313 lb (141.976 kg)  SpO2 95% Wt Readings from Last 3 Encounters:  09/20/14 313 lb (141.976 kg)  07/11/14 307 lb (139.254 kg)  07/06/14 306 lb (138.801 kg)    General: Appears his stated age, obese in NAD. Cardiovascular: Normal rate and rhythm. S1,S2 noted.  No murmur, rubs or gallops noted.  Pulmonary/Chest: Normal effort  and positive vesicular breath sounds. No respiratory distress. No wheezes, rales or ronchi noted.  Musculoskeletal: Limited range of motion in bilateral knees. No signs of joint swelling although joints do seem enlarged. Strength 4/5 BLE. Uses 4 prong cane to walk.  Neurological: Alert and oriented.    BMET    Component Value Date/Time   NA 140 07/11/2014 2201   K 3.9 07/11/2014 2201   CL 106 07/11/2014 2201   CO2 27 07/11/2014 2201   GLUCOSE 140* 07/11/2014 2201   BUN 22* 07/11/2014 2201   CREATININE 1.19 07/11/2014 2201   CALCIUM 8.6* 07/11/2014 2201   GFRNONAA >60 07/11/2014 2201   GFRAA >60 07/11/2014 2201    Lipid Panel     Component Value Date/Time   CHOL 157 03/02/2013 1036   TRIG 59.0 03/02/2013 1036   HDL 33.50* 03/02/2013 1036   CHOLHDL 5 03/02/2013 1036   VLDL 11.8 03/02/2013 1036   LDLCALC 112* 03/02/2013 1036    CBC    Component Value Date/Time   WBC 9.9 07/11/2014 2201   RBC 5.11 07/11/2014 2201   HGB 15.0 07/11/2014 2201   HCT 45.0 07/11/2014 2201   PLT 168 07/11/2014 2201   MCV 88.0 07/11/2014 2201   MCH 29.3 07/11/2014 2201   MCHC 33.3 07/11/2014 2201   RDW 14.1 07/11/2014 2201   LYMPHSABS 2.1 01/11/2014 1053   MONOABS 0.7 01/11/2014 1053   EOSABS 0.3 01/11/2014 1053   BASOSABS 0.1 01/11/2014 1053    Hgb A1C Lab Results  Component Value Date   HGBA1C 6.0 03/02/2013         Assessment & Plan:   Bilateral knee pain  Worsened by obesity- discussed trying to lose weight with water aerobics He will continue to follow with ortho Rx for tramadol BID Continue knee exercises as instructed by PT.  DOE  Advised patient to call cardiologist and see if his appointment can be moved up.   No edema or difficulty breathing noted on exam.   Cardiology note, heart cath, ECG and Echo reviewed in EMR  RTC as needed or if symptoms persist or worsen

## 2014-09-20 NOTE — Patient Instructions (Signed)
Knee Exercises EXERCISES RANGE OF MOTION (ROM) AND STRETCHING EXERCISES These exercises may help you when beginning to rehabilitate your injury. Your symptoms may resolve with or without further involvement from your physician, physical therapist, or athletic trainer. While completing these exercises, remember:   Restoring tissue flexibility helps normal motion to return to the joints. This allows healthier, less painful movement and activity.  An effective stretch should be held for at least 30 seconds.  A stretch should never be painful. You should only feel a gentle lengthening or release in the stretched tissue. STRETCH - Knee Extension, Prone  Lie on your stomach on a firm surface, such as a bed or countertop. Place your right / left knee and leg just beyond the edge of the surface. You may wish to place a towel under the far end of your right / left thigh for comfort.  Relax your leg muscles and allow gravity to straighten your knee. Your clinician may advise you to add an ankle weight if more resistance is helpful for you.  You should feel a stretch in the back of your right / left knee. Hold this position for __________ seconds. Repeat __________ times. Complete this stretch __________ times per day. * Your physician, physical therapist, or athletic trainer may ask you to add ankle weight to enhance your stretch.  RANGE OF MOTION - Knee Flexion, Active  Lie on your back with both knees straight. (If this causes back discomfort, bend your opposite knee, placing your foot flat on the floor.)  Slowly slide your heel back toward your buttocks until you feel a gentle stretch in the front of your knee or thigh.  Hold for __________ seconds. Slowly slide your heel back to the starting position. Repeat __________ times. Complete this exercise __________ times per day.  STRETCH - Quadriceps, Prone   Lie on your stomach on a firm surface, such as a bed or padded floor.  Bend your right /  left knee and grasp your ankle. If you are unable to reach your ankle or pant leg, use a belt around your foot to lengthen your reach.  Gently pull your heel toward your buttocks. Your knee should not slide out to the side. You should feel a stretch in the front of your thigh and/or knee.  Hold this position for __________ seconds. Repeat __________ times. Complete this stretch __________ times per day.  STRETCH - Hamstrings, Supine   Lie on your back. Loop a belt or towel over the ball of your right / left foot.  Straighten your right / left knee and slowly pull on the belt to raise your leg. Do not allow the right / left knee to bend. Keep your opposite leg flat on the floor.  Raise the leg until you feel a gentle stretch behind your right / left knee or thigh. Hold this position for __________ seconds. Repeat __________ times. Complete this stretch __________ times per day.  STRENGTHENING EXERCISES These exercises may help you when beginning to rehabilitate your injury. They may resolve your symptoms with or without further involvement from your physician, physical therapist, or athletic trainer. While completing these exercises, remember:   Muscles can gain both the endurance and the strength needed for everyday activities through controlled exercises.  Complete these exercises as instructed by your physician, physical therapist, or athletic trainer. Progress the resistance and repetitions only as guided.  You may experience muscle soreness or fatigue, but the pain or discomfort you are trying to eliminate   should never worsen during these exercises. If this pain does worsen, stop and make certain you are following the directions exactly. If the pain is still present after adjustments, discontinue the exercise until you can discuss the trouble with your clinician. STRENGTH - Quadriceps, Isometrics  Lie on your back with your right / left leg extended and your opposite knee  bent.  Gradually tense the muscles in the front of your right / left thigh. You should see either your knee cap slide up toward your hip or increased dimpling just above the knee. This motion will push the back of the knee down toward the floor/mat/bed on which you are lying.  Hold the muscle as tight as you can without increasing your pain for __________ seconds.  Relax the muscles slowly and completely in between each repetition. Repeat __________ times. Complete this exercise __________ times per day.  STRENGTH - Quadriceps, Short Arcs   Lie on your back. Place a __________ inch towel roll under your knee so that the knee slightly bends.  Raise only your lower leg by tightening the muscles in the front of your thigh. Do not allow your thigh to rise.  Hold this position for __________ seconds. Repeat __________ times. Complete this exercise __________ times per day.  OPTIONAL ANKLE WEIGHTS: Begin with ____________________, but DO NOT exceed ____________________. Increase in 1 pound/0.5 kilogram increments.  STRENGTH - Quadriceps, Straight Leg Raises  Quality counts! Watch for signs that the quadriceps muscle is working to insure you are strengthening the correct muscles and not "cheating" by substituting with healthier muscles.  Lay on your back with your right / left leg extended and your opposite knee bent.  Tense the muscles in the front of your right / left thigh. You should see either your knee cap slide up or increased dimpling just above the knee. Your thigh may even quiver.  Tighten these muscles even more and raise your leg 4 to 6 inches off the floor. Hold for __________ seconds.  Keeping these muscles tense, lower your leg.  Relax the muscles slowly and completely in between each repetition. Repeat __________ times. Complete this exercise __________ times per day.  STRENGTH - Hamstring, Curls  Lay on your stomach with your legs extended. (If you lay on a bed, your feet  may hang over the edge.)  Tighten the muscles in the back of your thigh to bend your right / left knee up to 90 degrees. Keep your hips flat on the bed/floor.  Hold this position for __________ seconds.  Slowly lower your leg back to the starting position. Repeat __________ times. Complete this exercise __________ times per day.  OPTIONAL ANKLE WEIGHTS: Begin with ____________________, but DO NOT exceed ____________________. Increase in 1 pound/0.5 kilogram increments.  STRENGTH - Quadriceps, Squats  Stand in a door frame so that your feet and knees are in line with the frame.  Use your hands for balance, not support, on the frame.  Slowly lower your weight, bending at the hips and knees. Keep your lower legs upright so that they are parallel with the door frame. Squat only within the range that does not increase your knee pain. Never let your hips drop below your knees.  Slowly return upright, pushing with your legs, not pulling with your hands. Repeat __________ times. Complete this exercise __________ times per day.  STRENGTH - Quadriceps, Wall Slides  Follow guidelines for form closely. Increased knee pain often results from poorly placed feet or knees.  Lean   against a smooth wall or door and walk your feet out 18-24 inches. Place your feet hip-width apart.  Slowly slide down the wall or door until your knees bend __________ degrees.* Keep your knees over your heels, not your toes, and in line with your hips, not falling to either side.  Hold for __________ seconds. Stand up to rest for __________ seconds in between each repetition. Repeat __________ times. Complete this exercise __________ times per day. * Your physician, physical therapist, or athletic trainer will alter this angle based on your symptoms and progress. Document Released: 11/21/2004 Document Revised: 05/24/2013 Document Reviewed: 04/21/2008 Exit[employer] Patient Information 2015 [employer]. This information is not  intended to replace advice given to you by your health care provider. Make sure you discuss any questions you have with your health care provider.  

## 2014-09-21 NOTE — Telephone Encounter (Signed)
Called number at bottom of message [phone]) and VM states that it is for "Richard [name]". Called number listed for pt ([phone]) x 2 and both times phone picked up and could hear strange noise in the background but no one responds when I say hello. No voicemail option. Will try again later.

## 2014-09-27 NOTE — Telephone Encounter (Signed)
Attempted to call pt agaix 2 and same occurrence that it sounds like someone picks up and there is a lot of background noises. Unable to leave message. Will close encounter for now and wait for pt to call back. Pt scheduled to see Dr. Irish Lack on 9/27.

## 2014-10-17 ENCOUNTER — Other Ambulatory Visit: Payer: Self-pay | Admitting: Orthopedic Surgery

## 2014-10-18 ENCOUNTER — Encounter: Payer: Self-pay | Admitting: Interventional Cardiology

## 2014-10-18 ENCOUNTER — Telehealth: Payer: Self-pay

## 2014-10-18 ENCOUNTER — Ambulatory Visit (INDEPENDENT_AMBULATORY_CARE_PROVIDER_SITE_OTHER): Payer: Managed Care, Other (non HMO) | Admitting: Interventional Cardiology

## 2014-10-18 VITALS — BP 118/82 | HR 66 | Ht 67.0 in | Wt 315.1 lb

## 2014-10-18 DIAGNOSIS — I10 Essential (primary) hypertension: Secondary | ICD-10-CM | POA: Diagnosis not present

## 2014-10-18 DIAGNOSIS — I5189 Other ill-defined heart diseases: Secondary | ICD-10-CM

## 2014-10-18 DIAGNOSIS — Z01810 Encounter for preprocedural cardiovascular examination: Secondary | ICD-10-CM

## 2014-10-18 DIAGNOSIS — I519 Heart disease, unspecified: Secondary | ICD-10-CM | POA: Diagnosis not present

## 2014-10-18 DIAGNOSIS — G4733 Obstructive sleep apnea (adult) (pediatric): Secondary | ICD-10-CM

## 2014-10-18 MED ORDER — ASPIRIN 81 MG PO TBEC: 81.0000 mg | DELAYED_RELEASE_TABLET | Freq: Every day | ORAL | Status: DC

## 2014-10-18 MED ORDER — METOPROLOL SUCCINATE ER 25 MG PO TB24: 12.5000 mg | ORAL_TABLET | Freq: Two times a day (BID) | ORAL | Status: DC

## 2014-10-18 NOTE — Patient Instructions (Signed)
Medication Instructions:  When you resume Aspirin after surgery REDUCE to 81mg  daily  Labwork: None ordered  Testing/Procedures: None orderd  Follow-Up: Your physician wants you to follow-up in: 1 year with Dr.Varanasi You will receive a reminder letter in the mail two months in advance. If you don't receive a letter, please call our office to schedule the follow-up appointment.   Any Other Special Instructions Will Be Listed Below (If Applicable). Please resume your CPAP therapy  You have been cleared for your upcoming surgery with Dr.Landau

## 2014-10-18 NOTE — Telephone Encounter (Signed)
Cardiac clearance placed in MR nurse fax box to be faxed to [facility] attn: Dr.Landau

## 2014-10-18 NOTE — Progress Notes (Signed)
Patient ID: Richard [name], male   DOB: 09/28/1960, 54 y.o.   MRN: [id number]     Cardiology Office Note   Date:  10/18/2014   ID:  Richard [name], DOB 03/21/1960, MRN [id number]  PCP:  Webb Silversmith, NP    Chief Complaint  Patient presents with  . Follow-up    pt here for cardiac clearance, complains of sob, no energy weakeness and gained weight     Wt Readings from Last 3 Encounters:  10/18/14 315 lb 1.9 oz (142.937 kg)  09/20/14 313 lb (141.976 kg)  07/11/14 307 lb (139.254 kg)       History of Present Illness: Richard [name] is a 54 y.o. male  who had a false positive nuclear stress test in late 2015. He underwent cardiac catheterization as part of a preoperative workup for total knee replacement. The cath was clean in 2015 although he had an increased LVEDP. He was started on Lasix due to shortness of breath but did not require this longterm.   He has had his surgery.  He did well with this surgery.  He is now having left TKR and needs preop eval.   He has been more fatigued.  He has not been using the CPAP machine.  He was sick but now he thinks he can get back on the machine.  He notices a difference when he uses the CPAP for the better.    He has been unable to work.  He thinks he ate more.  He still drinks sodas and juices.  He has gained 30 lbs back after the surgery.    Past Medical History  Diagnosis Date  . Obesity   . OSA (obstructive sleep apnea)   . Hypertension 08/17/2011  . Chest pain 08/16/2011    MI ruled out.  Marland Kitchen LVH (left ventricular hypertrophy) 08/17/2011    Per 2-D echocardiogram.  . GERD (gastroesophageal reflux disease) 08/17/2011  . Arthritis   . Urine incontinence   . Primary localized osteoarthritis of right knee 02/25/2014    Past Surgical History  Procedure Laterality Date  . Appendectomy    . Orthopedic surgery    . Left heart catheterization with coronary angiogram N/A 01/12/2014    Procedure: LEFT HEART CATHETERIZATION WITH  CORONARY ANGIOGRAM;  Surgeon: Jettie Booze, MD;  Location: [facility] CATH LAB;  Service: Cardiovascular;  Laterality: N/A;  . Partial knee arthroplasty Right 02/25/2014    Procedure: RIGHT KNEE ARTHROPLASTY CONDYLE AND PLATEAU MEDIAL COMPARTMENT ;  Surgeon: Johnny Bridge, MD;  Location: [facility];  Service: Orthopedics;  Laterality: Right;     Current Outpatient Prescriptions  Medication Sig Dispense Refill  . aspirin EC 325 MG tablet Take 325 mg by mouth daily.  0  . baclofen (LIORESAL) 10 MG tablet Take 1 tablet (10 mg total) by mouth 3 (three) times daily. As needed for muscle spasm 50 tablet 0  . esomeprazole (NEXIUM) 40 MG capsule Take 40 mg by mouth daily at 12 noon.    . meloxicam (MOBIC) 15 MG tablet TAKE 1 TABLET BY MOUTH EVERY DAY WITH FOOD FOR 2 WEEKS, THEN USE AS NEEDED  1  . metoprolol succinate (TOPROL-XL) 25 MG 24 hr tablet TAKE 1/2 TABLET BY MOUTH TWICE A DAY 90 tablet 3  . traMADol (ULTRAM) 50 MG tablet Take 1 tablet (50 mg total) by mouth 2 (two) times daily. 60 tablet 0   No current facility-administered medications for this visit.    Allergies:  Penicillins    Social History:  The patient  reports that he has never smoked. He has never used smokeless tobacco. He reports that he drinks alcohol. He reports that he does not use illicit drugs.   Family History:  The patient's *family history includes Alzheimer's disease in his paternal grandfather; Arthritis in his mother; Coronary artery disease in his father and mother; Heart disease in his paternal grandmother; Hyperlipidemia in his mother; Hypertension in his mother.    ROS:  Please see the history of present illness.   Otherwise, review of systems are positive for knee pain.   All other systems are reviewed and negative.    PHYSICAL EXAM: VS:  BP 118/82 mmHg  Pulse 66  Ht 5\' 7"  (1.702 m)  Wt 315 lb 1.9 oz (142.937 kg)  BMI 49.34 kg/m2  SpO2 93% , BMI Body mass index is 49.34 kg/(m^2). GEN:  Well nourished, well developed, in no acute distress HEENT: normal Neck: no JVD, carotid bruits, or masses Cardiac: RRR; no murmurs, rubs, or gallops,no edema  Respiratory:  clear to auscultation bilaterally, normal work of breathing GI: soft, nontender, nondistended, + BS, obese MS: no deformity or atrophy Skin: warm and dry, no rash, well healed right knee scar Neuro:  Strength and sensation are intact Psych: euthymic mood, full affect   EKG:   The ekg ordered demonstrates NSR, no ST changes in June 2016.   Recent Labs: 12/30/2013: TSH 1.53 07/11/2014: BUN 22*; Creatinine, Ser 1.19; Hemoglobin 15.0; Platelets 168; Potassium 3.9; Sodium 140   Lipid Panel    Component Value Date/Time   CHOL 157 03/02/2013 1036   TRIG 59.0 03/02/2013 1036   HDL 33.50* 03/02/2013 1036   CHOLHDL 5 03/02/2013 1036   VLDL 11.8 03/02/2013 1036   LDLCALC 112* 03/02/2013 1036     Other studies Reviewed: Additional studies/ records that were reviewed today with results demonstrating: Cath without significant coronary artery disease in late 2015.   ASSESSMENT AND PLAN:  1. Preoperative evaluation: No further cardiac testing needed before surgery. He tolerated the more recent knee surgery quite well. He can decrease aspirin to 81 mg daily as this would give him the same cardiac benefit. 2. Obesity: We spoke about the importance of losing weight. His diet still needs a lot of improvement. He has been inactive and he drinks soft drinks and juices. I asked him to drink more water and if he needs flavored water, to use something like crystal lite with less calories. 3. Sleep apnea/shortness of breath: He has been feeling worse in the last couple of weeks. He has been off of his CPap as well. He states that he second couldn't use the machine. He is feeling better now and is willing to go back to using the machine for his sleep apnea. He notices a significant difference when he does use the machine. I encouraged  him to use it as much as possible. 4. Chronic diastolic heart failure: He feels more fluid overloaded at this time. This may improve just with C Pap usage.  He has used furosemide in the past. At times he felt worse on the diuretics. We'll hold off for now. Continue to minimize salt intake. 5. Hypertension: Well controlled. Continue metoprolol.   Current medicines are reviewed at length with the patient today.  The patient concerns regarding his medicines were addressed.  The following changes have been made:  refill metoprolol  Labs/ tests ordered today include:  No orders of the defined types  were placed in this encounter.    Recommend 150 minutes/week of aerobic exercise Low fat, low carb, high fiber diet recommended  Disposition:   FU in one year   Teresita Madura., MD  10/18/2014 9:22 AM    [facility] [address], [place], [place]  [address] Phone: [phone]; Fax: [phone]

## 2014-11-24 NOTE — Pre-Procedure Instructions (Addendum)
SLATE DEBROUX  11/24/2014      [id number] [place], [place] - [address] [place] [address] Phone: [phone] Fax: [phone]    Your procedure is scheduled on Tues, Nov 15   Report to [facility] Admitting at 5:30 AM  Call this number if you have problems the morning of surgery:  [phone]   Remember:  Do not eat food or drink liquids after midnight.  Take these medicines the morning of surgery with A SIP OF WATER Baclofen(Lioresal),Metoprolol(Toprol),and Tramadol(Ultram)              Stop taking your Aspirin and Meloxicam. No Goody's,BC's,Aleve,Ibuprofen,Motrin,Advil,Fish Oil,or any Herbal Medicaitons. ,vitamins starting 12/01/14   Do not wear jewelry.  Do not wear lotions, powders, or colognes.  You may wear deodorant.  Men may shave face and neck.  Do not bring valuables to the hospital.  [facility] is not responsible for any belongings or valuables.  Contacts, dentures or bridgework may not be worn into surgery.  Leave your suitcase in the car.  After surgery it may be brought to your room.  For patients admitted to the hospital, discharge time will be determined by your treatment team.  Patients discharged the day of surgery will not be allowed to drive home.    Special instructions:  [facility] - Preparing for Surgery  Before surgery, you can play an important role.  Because skin is not sterile, your skin needs to be as free of germs as possible.  You can reduce the number of germs on you skin by washing with CHG (chlorahexidine gluconate) soap before surgery.  CHG is an antiseptic cleaner which kills germs and bonds with the skin to continue killing germs even after washing.  Please DO NOT use if you have an allergy to CHG or antibacterial soaps.  If your skin becomes reddened/irritated stop using the CHG and inform your nurse when you arrive at Short Stay.  Do not shave (including legs and underarms) for at least  48 hours prior to the first CHG shower.  You may shave your face.  Please follow these instructions carefully:   1.  Shower with CHG Soap the night before surgery and the                                morning of Surgery.  2.  If you choose to wash your hair, wash your hair first as usual with your       normal shampoo.  3.  After you shampoo, rinse your hair and body thoroughly to remove the                      Shampoo.  4.  Use CHG as you would any other liquid soap.  You can apply chg directly       to the skin and wash gently with scrungie or a clean washcloth.  5.  Apply the CHG Soap to your body ONLY FROM THE NECK DOWN.        Do not use on open wounds or open sores.  Avoid contact with your eyes,       ears, mouth and genitals (private parts).  Wash genitals (private parts)       with your normal soap.  6.  Wash thoroughly, paying special attention to the area where your surgery  will be performed.  7.  Thoroughly rinse your body with warm water from the neck down.  8.  DO NOT shower/wash with your normal soap after using and rinsing off       the CHG Soap.  9.  Pat yourself dry with a clean towel.            10.  Wear clean pajamas.            11.  Place clean sheets on your bed the night of your first shower and do not        sleep with pets.  Day of Surgery  Do not apply any lotions/deoderants the morning of surgery.  Please wear clean clothes to the hospital/surgery center.    Please read over the following fact sheets that you were given. Pain Booklet, Coughing and Deep Breathing, MRSA Information and Surgical Site Infection Prevention

## 2014-11-25 ENCOUNTER — Encounter (HOSPITAL_COMMUNITY): Payer: Self-pay

## 2014-11-25 ENCOUNTER — Encounter (HOSPITAL_COMMUNITY)
Admission: RE | Admit: 2014-11-25 | Discharge: 2014-11-25 | Disposition: A | Discharge status: Home / Self Care | Payer: Managed Care, Other (non HMO) | Source: Ambulatory Visit | Attending: Orthopedic Surgery | Admitting: Orthopedic Surgery

## 2014-11-25 DIAGNOSIS — G4733 Obstructive sleep apnea (adult) (pediatric): Secondary | ICD-10-CM | POA: Insufficient documentation

## 2014-11-25 DIAGNOSIS — I10 Essential (primary) hypertension: Secondary | ICD-10-CM | POA: Diagnosis not present

## 2014-11-25 DIAGNOSIS — M1990 Unspecified osteoarthritis, unspecified site: Secondary | ICD-10-CM | POA: Insufficient documentation

## 2014-11-25 DIAGNOSIS — Z01812 Encounter for preprocedural laboratory examination: Secondary | ICD-10-CM | POA: Insufficient documentation

## 2014-11-25 DIAGNOSIS — Z7982 Long term (current) use of aspirin: Secondary | ICD-10-CM | POA: Diagnosis not present

## 2014-11-25 DIAGNOSIS — Z01818 Encounter for other preprocedural examination: Secondary | ICD-10-CM | POA: Diagnosis not present

## 2014-11-25 DIAGNOSIS — K219 Gastro-esophageal reflux disease without esophagitis: Secondary | ICD-10-CM | POA: Diagnosis not present

## 2014-11-25 DIAGNOSIS — Z79899 Other long term (current) drug therapy: Secondary | ICD-10-CM | POA: Diagnosis not present

## 2014-11-25 DIAGNOSIS — Z96651 Presence of right artificial knee joint: Secondary | ICD-10-CM | POA: Diagnosis not present

## 2014-11-25 HISTORY — DX: Cardiac arrhythmia, unspecified: I49.9

## 2014-11-25 LAB — CBC
HCT: 47.6 % (ref 39.0–52.0)
Hemoglobin: 15.5 g/dL (ref 13.0–17.0)
MCH: 29.2 pg (ref 26.0–34.0)
MCHC: 32.6 g/dL (ref 30.0–36.0)
MCV: 89.8 fL (ref 78.0–100.0)
Platelets: 183 10*3/uL (ref 150–400)
RBC: 5.3 MIL/uL (ref 4.22–5.81)
RDW: 13.5 % (ref 11.5–15.5)
WBC: 8.3 10*3/uL (ref 4.0–10.5)

## 2014-11-25 LAB — BASIC METABOLIC PANEL
Anion gap: 5 (ref 5–15)
BUN: 14 mg/dL (ref 6–20)
CO2: 29 mmol/L (ref 22–32)
Calcium: 8.9 mg/dL (ref 8.9–10.3)
Chloride: 106 mmol/L (ref 101–111)
Creatinine, Ser: 1.22 mg/dL (ref 0.61–1.24)
GFR calc Af Amer: >60 mL/min (ref >60)
GFR calc non Af Amer: >60 mL/min (ref >60)
Glucose, Bld: 148 mg/dL — ABNORMAL HIGH (ref 65–99)
Potassium: 4.3 mmol/L (ref 3.5–5.1)
Sodium: 140 mmol/L (ref 135–145)

## 2014-11-25 LAB — SURGICAL PCR SCREEN
MRSA, PCR: NEGATIVE
Staphylococcus aureus: NEGATIVE

## 2014-11-28 NOTE — Progress Notes (Signed)
Anesthesia Chart Review:  Pt is 54 year old male scheduled for L unicompartmental knee on 12/06/2014 with Dr. Mardelle Matte.   Cardiologist is Dr. Irish Lack, last office visit 10/18/14. PCP is Webb Silversmith, NP.  PMH includes: HTN, LVH, tachycardia, OSA, GERD. Never smoker. BMI 47. S/p R knee arthroplasty medial compartment 02/25/14.   Medications include: ASA, nexium, metoprolol.   Preoperative labs reviewed.  Glucose 148.   1 view CXR 07/06/14: No active disease.   EKG 07/06/14: Sinus bradycardia (58 bpm). Left axis deviation. Pulmonary disease pattern  Cardiac cath 01/12/14 (for abnormal stress test): 1. Normal left main coronary artery. 2. Widely patent left anterior descending artery and its branches. 3. Widely patent left circumflex artery and its branches. 4. Widely patent right coronary artery. 5. Normal left ventricular systolic function. LVEDP 20 mmHg. Ejection fraction 60 %.  Echo 01/06/14: - Left ventricle: The cavity size was normal. There was mild concentric hypertrophy. Systolic function was normal. The estimated ejection fraction was in the range of 55% to 60%. Wall motion was normal; there were no regional wall motion abnormalities. - Aortic valve: There was mild regurgitation.  Pt has cardiac clearance from Dr. Irish Lack in [place] note dated 10/18/14.   If no changes, I anticipate pt can proceed with surgery as scheduled.   Willeen Cass, FNP-BC [facility] Short Stay Surgical Center/Anesthesiology Phone: [phone] 11/28/2014 2:21 PM

## 2014-12-05 MED ORDER — DEXTROSE 5 % IV SOLN
3.0000 g | INTRAVENOUS | Status: AC
  Administered 2014-12-06: 3 g via INTRAVENOUS
  Filled 2014-12-05: qty 3000

## 2014-12-05 NOTE — Anesthesia Preprocedure Evaluation (Addendum)
Anesthesia Evaluation  Patient identified by MRN, date of birth, ID band Patient awake    Reviewed: Allergy & Precautions, NPO status , Patient's Chart, lab work & pertinent test results  Airway Mallampati: I  TM Distance: >3 FB Neck ROM: Full    Dental  (+) Teeth Intact, Dental Advisory Given   Pulmonary sleep apnea and Continuous Positive Airway Pressure Ventilation ,    breath sounds clear to auscultation       Cardiovascular hypertension, Pt. on medications + dysrhythmias  Rhythm:Regular Rate:Normal     Neuro/Psych    GI/Hepatic GERD  Medicated and Controlled,  Endo/Other  Morbid obesity  Renal/GU      Musculoskeletal   Abdominal (+) + obese,   Peds  Hematology   Anesthesia Other Findings   Reproductive/Obstetrics                           Anesthesia Physical  Anesthesia Plan  ASA: III  Anesthesia Plan: Regional and Spinal   Post-op Pain Management:    Induction: Intravenous  Airway Management Planned:   Additional Equipment:   Intra-op Plan:   Post-operative Plan: Extubation in OR  Informed Consent: I have reviewed the patients History and Physical, chart, labs and discussed the procedure including the risks, benefits and alternatives for the proposed anesthesia with the patient or authorized representative who has indicated his/her understanding and acceptance.   Dental advisory given and Dental Advisory Given  Plan Discussed with: CRNA, Anesthesiologist and Surgeon  Anesthesia Plan Comments:      Anesthesia Quick Evaluation

## 2014-12-06 ENCOUNTER — Inpatient Hospital Stay (HOSPITAL_COMMUNITY): Payer: Managed Care, Other (non HMO) | Admitting: Emergency Medicine

## 2014-12-06 ENCOUNTER — Inpatient Hospital Stay (HOSPITAL_COMMUNITY): Payer: Managed Care, Other (non HMO)

## 2014-12-06 ENCOUNTER — Inpatient Hospital Stay (HOSPITAL_COMMUNITY): Payer: Managed Care, Other (non HMO) | Admitting: Anesthesiology

## 2014-12-06 ENCOUNTER — Encounter (HOSPITAL_COMMUNITY)
Admission: AD | Disposition: A | Discharge status: Home Health | Payer: Self-pay | Source: Ambulatory Visit | Attending: Orthopedic Surgery

## 2014-12-06 ENCOUNTER — Encounter (HOSPITAL_COMMUNITY): Payer: Self-pay | Admitting: Anesthesiology

## 2014-12-06 ENCOUNTER — Inpatient Hospital Stay (HOSPITAL_COMMUNITY)
Admission: AD | Admit: 2014-12-06 | Discharge: 2014-12-09 | DRG: 470 | Disposition: A | Discharge status: Home Health | Payer: Managed Care, Other (non HMO) | Source: Ambulatory Visit | Attending: Orthopedic Surgery | Admitting: Orthopedic Surgery

## 2014-12-06 DIAGNOSIS — Z820 Family history of epilepsy and other diseases of the nervous system: Secondary | ICD-10-CM | POA: Diagnosis not present

## 2014-12-06 DIAGNOSIS — G4733 Obstructive sleep apnea (adult) (pediatric): Secondary | ICD-10-CM | POA: Diagnosis present

## 2014-12-06 DIAGNOSIS — Z7982 Long term (current) use of aspirin: Secondary | ICD-10-CM | POA: Diagnosis not present

## 2014-12-06 DIAGNOSIS — M1712 Unilateral primary osteoarthritis, left knee: Secondary | ICD-10-CM | POA: Diagnosis present

## 2014-12-06 DIAGNOSIS — I119 Hypertensive heart disease without heart failure: Secondary | ICD-10-CM | POA: Diagnosis present

## 2014-12-06 DIAGNOSIS — Z96651 Presence of right artificial knee joint: Secondary | ICD-10-CM | POA: Diagnosis present

## 2014-12-06 DIAGNOSIS — M1710 Unilateral primary osteoarthritis, unspecified knee: Secondary | ICD-10-CM | POA: Diagnosis present

## 2014-12-06 DIAGNOSIS — Z880 Allergy status to penicillin: Secondary | ICD-10-CM | POA: Diagnosis not present

## 2014-12-06 DIAGNOSIS — Z8249 Family history of ischemic heart disease and other diseases of the circulatory system: Secondary | ICD-10-CM

## 2014-12-06 DIAGNOSIS — K219 Gastro-esophageal reflux disease without esophagitis: Secondary | ICD-10-CM | POA: Diagnosis present

## 2014-12-06 DIAGNOSIS — Z8261 Family history of arthritis: Secondary | ICD-10-CM | POA: Diagnosis not present

## 2014-12-06 DIAGNOSIS — Z6841 Body Mass Index (BMI) 40.0 and over, adult: Secondary | ICD-10-CM

## 2014-12-06 DIAGNOSIS — M179 Osteoarthritis of knee, unspecified: Secondary | ICD-10-CM | POA: Diagnosis present

## 2014-12-06 DIAGNOSIS — Z96659 Presence of unspecified artificial knee joint: Secondary | ICD-10-CM

## 2014-12-06 HISTORY — DX: Unilateral primary osteoarthritis, left knee: M17.12

## 2014-12-06 HISTORY — PX: TOTAL KNEE ARTHROPLASTY: SHX125

## 2014-12-06 SURGERY — ARTHROPLASTY, KNEE, TOTAL
Anesthesia: Regional | Site: Knee | Laterality: Left

## 2014-12-06 MED ORDER — 0.9 % SODIUM CHLORIDE (POUR BTL) OPTIME
TOPICAL | Status: DC | PRN
  Administered 2014-12-06: 1000 mL

## 2014-12-06 MED ORDER — SENNA 8.6 MG PO TABS
1.0000 | ORAL_TABLET | Freq: Two times a day (BID) | ORAL | Status: DC
  Administered 2014-12-06 – 2014-12-09 (×6): 8.6 mg via ORAL
  Filled 2014-12-06 (×7): qty 1

## 2014-12-06 MED ORDER — HYDROMORPHONE HCL 1 MG/ML IJ SOLN
0.2500 mg | INTRAMUSCULAR | Status: DC | PRN
  Administered 2014-12-06 (×2): 0.5 mg via INTRAVENOUS

## 2014-12-06 MED ORDER — DEXAMETHASONE SODIUM PHOSPHATE 10 MG/ML IJ SOLN
10.0000 mg | Freq: Once | INTRAMUSCULAR | Status: AC
  Administered 2014-12-07: 10 mg via INTRAVENOUS
  Filled 2014-12-06: qty 1

## 2014-12-06 MED ORDER — CEFAZOLIN SODIUM-DEXTROSE 2-3 GM-% IV SOLR
2.0000 g | Freq: Four times a day (QID) | INTRAVENOUS | Status: AC
  Administered 2014-12-06 – 2014-12-07 (×2): 2 g via INTRAVENOUS
  Filled 2014-12-06 (×3): qty 50

## 2014-12-06 MED ORDER — LIDOCAINE HCL (CARDIAC) 20 MG/ML IV SOLN
INTRAVENOUS | Status: AC
  Filled 2014-12-06: qty 5

## 2014-12-06 MED ORDER — METOCLOPRAMIDE HCL 5 MG PO TABS: 5.0000 mg | ORAL_TABLET | Freq: Three times a day (TID) | ORAL | Status: DC | PRN

## 2014-12-06 MED ORDER — MIDAZOLAM HCL 2 MG/2ML IJ SOLN
INTRAMUSCULAR | Status: AC
  Filled 2014-12-06: qty 4

## 2014-12-06 MED ORDER — PROMETHAZINE HCL 25 MG/ML IJ SOLN: 6.2500 mg | INTRAMUSCULAR | Status: DC | PRN

## 2014-12-06 MED ORDER — POTASSIUM CHLORIDE IN NACL 20-0.45 MEQ/L-% IV SOLN
INTRAVENOUS | Status: DC
  Administered 2014-12-06 – 2014-12-07 (×2): via INTRAVENOUS
  Filled 2014-12-06 (×7): qty 1000

## 2014-12-06 MED ORDER — ROCURONIUM BROMIDE 50 MG/5ML IV SOLN
INTRAVENOUS | Status: AC
  Filled 2014-12-06: qty 1

## 2014-12-06 MED ORDER — ACETAMINOPHEN 325 MG PO TABS
650.0000 mg | ORAL_TABLET | Freq: Four times a day (QID) | ORAL | Status: DC | PRN
  Administered 2014-12-06: 650 mg via ORAL
  Filled 2014-12-06: qty 2

## 2014-12-06 MED ORDER — PHENOL 1.4 % MT LIQD: 1.0000 | OROMUCOSAL | Status: DC | PRN

## 2014-12-06 MED ORDER — PANTOPRAZOLE SODIUM 40 MG PO PACK
40.0000 mg | PACK | Freq: Every day | ORAL | Status: DC
  Administered 2014-12-07 – 2014-12-09 (×3): 40 mg via ORAL
  Filled 2014-12-06 (×3): qty 20

## 2014-12-06 MED ORDER — FENTANYL CITRATE (PF) 250 MCG/5ML IJ SOLN
INTRAMUSCULAR | Status: AC
  Filled 2014-12-06: qty 5

## 2014-12-06 MED ORDER — LIDOCAINE HCL (CARDIAC) 20 MG/ML IV SOLN
INTRAVENOUS | Status: DC | PRN
  Administered 2014-12-06: 50 mg via INTRAVENOUS

## 2014-12-06 MED ORDER — EPHEDRINE SULFATE 50 MG/ML IJ SOLN
INTRAMUSCULAR | Status: AC
  Filled 2014-12-06: qty 1

## 2014-12-06 MED ORDER — DOCUSATE SODIUM 100 MG PO CAPS
100.0000 mg | ORAL_CAPSULE | Freq: Two times a day (BID) | ORAL | Status: DC
  Administered 2014-12-06 – 2014-12-09 (×6): 100 mg via ORAL
  Filled 2014-12-06 (×7): qty 1

## 2014-12-06 MED ORDER — METHOCARBAMOL 1000 MG/10ML IJ SOLN
500.0000 mg | Freq: Four times a day (QID) | INTRAVENOUS | Status: DC | PRN
  Filled 2014-12-06: qty 5

## 2014-12-06 MED ORDER — ONDANSETRON HCL 4 MG PO TABS: 4.0000 mg | ORAL_TABLET | Freq: Four times a day (QID) | ORAL | Status: DC | PRN

## 2014-12-06 MED ORDER — OXYCODONE-ACETAMINOPHEN 10-325 MG PO TABS: 1.0000 | ORAL_TABLET | Freq: Four times a day (QID) | ORAL | Status: DC | PRN

## 2014-12-06 MED ORDER — NEOSTIGMINE METHYLSULFATE 10 MG/10ML IV SOLN
INTRAVENOUS | Status: DC | PRN
  Administered 2014-12-06: 3 mg via INTRAVENOUS

## 2014-12-06 MED ORDER — KETOROLAC TROMETHAMINE 15 MG/ML IJ SOLN
7.5000 mg | Freq: Four times a day (QID) | INTRAMUSCULAR | Status: AC
  Administered 2014-12-06 – 2014-12-07 (×4): 7.5 mg via INTRAVENOUS
  Filled 2014-12-06 (×3): qty 1

## 2014-12-06 MED ORDER — METHOCARBAMOL 500 MG PO TABS
500.0000 mg | ORAL_TABLET | Freq: Four times a day (QID) | ORAL | Status: DC | PRN
  Administered 2014-12-06 – 2014-12-09 (×8): 500 mg via ORAL
  Filled 2014-12-06 (×7): qty 1

## 2014-12-06 MED ORDER — OXYCODONE HCL 5 MG PO TABS
ORAL_TABLET | ORAL | Status: AC
  Filled 2014-12-06: qty 1

## 2014-12-06 MED ORDER — ACETAMINOPHEN 10 MG/ML IV SOLN
INTRAVENOUS | Status: AC
  Administered 2014-12-06: 1000 mg via INTRAVENOUS
  Filled 2014-12-06: qty 100

## 2014-12-06 MED ORDER — HYDROMORPHONE HCL 1 MG/ML IJ SOLN
INTRAMUSCULAR | Status: AC
  Administered 2014-12-06: 0.5 mg via INTRAVENOUS
  Filled 2014-12-06: qty 1

## 2014-12-06 MED ORDER — ONDANSETRON HCL 4 MG/2ML IJ SOLN
INTRAMUSCULAR | Status: AC
  Filled 2014-12-06: qty 4

## 2014-12-06 MED ORDER — TRAMADOL HCL 50 MG PO TABS
50.0000 mg | ORAL_TABLET | Freq: Two times a day (BID) | ORAL | Status: DC
  Administered 2014-12-06 – 2014-12-09 (×6): 50 mg via ORAL
  Filled 2014-12-06 (×6): qty 1

## 2014-12-06 MED ORDER — ALUM & MAG HYDROXIDE-SIMETH 200-200-20 MG/5ML PO SUSP: 30.0000 mL | ORAL | Status: DC | PRN

## 2014-12-06 MED ORDER — OXYCODONE HCL 5 MG PO TABS
5.0000 mg | ORAL_TABLET | ORAL | Status: DC | PRN
  Administered 2014-12-06: 5 mg via ORAL
  Administered 2014-12-06 – 2014-12-09 (×13): 10 mg via ORAL
  Filled 2014-12-06 (×13): qty 2

## 2014-12-06 MED ORDER — METOCLOPRAMIDE HCL 5 MG/ML IJ SOLN: 5.0000 mg | Freq: Three times a day (TID) | INTRAMUSCULAR | Status: DC | PRN

## 2014-12-06 MED ORDER — SODIUM CHLORIDE 0.9 % IJ SOLN
INTRAMUSCULAR | Status: AC
  Filled 2014-12-06: qty 10

## 2014-12-06 MED ORDER — MIDAZOLAM HCL 5 MG/5ML IJ SOLN
INTRAMUSCULAR | Status: DC | PRN
Start: 2014-12-06 — End: 2014-12-06
  Administered 2014-12-06: 2 mg via INTRAVENOUS

## 2014-12-06 MED ORDER — BACLOFEN 10 MG PO TABS: 10.0000 mg | ORAL_TABLET | Freq: Three times a day (TID) | ORAL | Status: DC

## 2014-12-06 MED ORDER — MENTHOL 3 MG MT LOZG
1.0000 | LOZENGE | OROMUCOSAL | Status: DC | PRN
  Administered 2014-12-09: 3 mg via ORAL
  Filled 2014-12-06: qty 9

## 2014-12-06 MED ORDER — SUCCINYLCHOLINE CHLORIDE 20 MG/ML IJ SOLN
INTRAMUSCULAR | Status: DC | PRN
  Administered 2014-12-06: 140 mg via INTRAVENOUS

## 2014-12-06 MED ORDER — SODIUM CHLORIDE 0.9 % IR SOLN
Status: DC | PRN
  Administered 2014-12-06: 1000 mL

## 2014-12-06 MED ORDER — ONDANSETRON HCL 4 MG/2ML IJ SOLN: 4.0000 mg | Freq: Four times a day (QID) | INTRAMUSCULAR | Status: DC | PRN

## 2014-12-06 MED ORDER — POLYETHYLENE GLYCOL 3350 17 G PO PACK: 17.0000 g | PACK | Freq: Every day | ORAL | Status: DC | PRN

## 2014-12-06 MED ORDER — ROCURONIUM BROMIDE 100 MG/10ML IV SOLN
INTRAVENOUS | Status: DC | PRN
  Administered 2014-12-06: 50 mg via INTRAVENOUS

## 2014-12-06 MED ORDER — FENTANYL CITRATE (PF) 100 MCG/2ML IJ SOLN
INTRAMUSCULAR | Status: DC | PRN
  Administered 2014-12-06 (×3): 50 ug via INTRAVENOUS
  Administered 2014-12-06: 100 ug via INTRAVENOUS

## 2014-12-06 MED ORDER — METOPROLOL SUCCINATE ER 25 MG PO TB24
12.5000 mg | ORAL_TABLET | Freq: Two times a day (BID) | ORAL | Status: DC
  Administered 2014-12-06 – 2014-12-09 (×6): 12.5 mg via ORAL
  Filled 2014-12-06 (×6): qty 1

## 2014-12-06 MED ORDER — ONDANSETRON HCL 4 MG PO TABS: 4.0000 mg | ORAL_TABLET | Freq: Three times a day (TID) | ORAL | Status: DC | PRN

## 2014-12-06 MED ORDER — METOPROLOL SUCCINATE 12.5 MG HALF TABLET
12.5000 mg | ORAL_TABLET | ORAL | Status: AC
  Administered 2014-12-06: 12.5 mg via ORAL
  Filled 2014-12-06: qty 1

## 2014-12-06 MED ORDER — KETOROLAC TROMETHAMINE 15 MG/ML IJ SOLN
INTRAMUSCULAR | Status: AC
  Filled 2014-12-06: qty 1

## 2014-12-06 MED ORDER — RIVAROXABAN 10 MG PO TABS
10.0000 mg | ORAL_TABLET | Freq: Every day | ORAL | Status: DC
  Administered 2014-12-07 – 2014-12-09 (×3): 10 mg via ORAL
  Filled 2014-12-06 (×3): qty 1

## 2014-12-06 MED ORDER — LACTATED RINGERS IV SOLN
INTRAVENOUS | Status: DC | PRN
  Administered 2014-12-06 (×2): via INTRAVENOUS

## 2014-12-06 MED ORDER — SENNA-DOCUSATE SODIUM 8.6-50 MG PO TABS: 2.0000 | ORAL_TABLET | Freq: Every day | ORAL | Status: DC

## 2014-12-06 MED ORDER — DIPHENHYDRAMINE HCL 12.5 MG/5ML PO ELIX
12.5000 mg | ORAL_SOLUTION | ORAL | Status: DC | PRN
  Administered 2014-12-09: 25 mg via ORAL
  Filled 2014-12-06: qty 10

## 2014-12-06 MED ORDER — METHOCARBAMOL 500 MG PO TABS
ORAL_TABLET | ORAL | Status: AC
  Filled 2014-12-06: qty 1

## 2014-12-06 MED ORDER — MEPERIDINE HCL 25 MG/ML IJ SOLN: 6.2500 mg | INTRAMUSCULAR | Status: DC | PRN

## 2014-12-06 MED ORDER — HYDROMORPHONE HCL 1 MG/ML IJ SOLN
1.0000 mg | INTRAMUSCULAR | Status: DC | PRN
  Administered 2014-12-07: 1 mg via INTRAVENOUS
  Filled 2014-12-06: qty 1

## 2014-12-06 MED ORDER — GLYCOPYRROLATE 0.2 MG/ML IJ SOLN
INTRAMUSCULAR | Status: DC | PRN
  Administered 2014-12-06: 0.6 mg via INTRAVENOUS

## 2014-12-06 MED ORDER — SUCCINYLCHOLINE CHLORIDE 20 MG/ML IJ SOLN
INTRAMUSCULAR | Status: AC
  Filled 2014-12-06: qty 1

## 2014-12-06 MED ORDER — BISACODYL 10 MG RE SUPP
10.0000 mg | Freq: Every day | RECTAL | Status: DC | PRN
Start: 2014-12-06 — End: 2014-12-09

## 2014-12-06 MED ORDER — BUPIVACAINE-EPINEPHRINE (PF) 0.5% -1:200000 IJ SOLN
INTRAMUSCULAR | Status: DC | PRN
  Administered 2014-12-06: 30 mL via PERINEURAL

## 2014-12-06 MED ORDER — MAGNESIUM CITRATE PO SOLN: 1.0000 | Freq: Once | ORAL | Status: DC | PRN

## 2014-12-06 MED ORDER — PROPOFOL 10 MG/ML IV BOLUS
INTRAVENOUS | Status: DC | PRN
  Administered 2014-12-06: 200 mg via INTRAVENOUS

## 2014-12-06 MED ORDER — RIVAROXABAN 10 MG PO TABS: 10.0000 mg | ORAL_TABLET | Freq: Every day | ORAL | Status: DC

## 2014-12-06 MED ORDER — ONDANSETRON HCL 4 MG/2ML IJ SOLN
INTRAMUSCULAR | Status: DC | PRN
  Administered 2014-12-06: 4 mg via INTRAVENOUS

## 2014-12-06 MED ORDER — ACETAMINOPHEN 650 MG RE SUPP: 650.0000 mg | Freq: Four times a day (QID) | RECTAL | Status: DC | PRN

## 2014-12-06 MED ORDER — BUPIVACAINE HCL (PF) 0.25 % IJ SOLN
INTRAMUSCULAR | Status: DC | PRN
  Administered 2014-12-06: 20 mL

## 2014-12-06 MED ORDER — PROPOFOL 10 MG/ML IV BOLUS
INTRAVENOUS | Status: AC
  Filled 2014-12-06: qty 20

## 2014-12-06 MED ORDER — ONDANSETRON HCL 4 MG/2ML IJ SOLN
INTRAMUSCULAR | Status: AC
  Filled 2014-12-06: qty 2

## 2014-12-06 SURGICAL SUPPLY — 66 items
BANDAGE ELASTIC 6 VELCRO ST LF (GAUZE/BANDAGES/DRESSINGS) ×3 IMPLANT
BANDAGE ESMARK 6X9 LF (GAUZE/BANDAGES/DRESSINGS) ×2 IMPLANT
BLADE SAG 18X100X1.27 (BLADE) ×3 IMPLANT
BLADE SAGITTAL (BLADE) ×1
BLADE SAGITTAL 25.0X1.19X90 (BLADE) ×6 IMPLANT
BLADE SAW RECIPROCATING 77.5 (BLADE) ×3 IMPLANT
BLADE SAW THK.89X75X18XSGTL (BLADE) ×2 IMPLANT
BNDG ESMARK 6X9 LF (GAUZE/BANDAGES/DRESSINGS) ×3
BOWL SMART MIX CTS (DISPOSABLE) ×3 IMPLANT
CAP KNEE TOTAL 3 SIGMA ×3 IMPLANT
CEMENT HV SMART SET (Cement) ×6 IMPLANT
CLSR STERI-STRIP ANTIMIC 1/2X4 (GAUZE/BANDAGES/DRESSINGS) ×6 IMPLANT
COVER SURGICAL LIGHT HANDLE (MISCELLANEOUS) ×3 IMPLANT
CUFF TOURNIQUET SINGLE 34IN LL (TOURNIQUET CUFF) ×3 IMPLANT
DRAPE EXTREMITY T 121X128X90 (DRAPE) ×3 IMPLANT
DRAPE IMP U-DRAPE 54X76 (DRAPES) ×3 IMPLANT
DRAPE ORTHO SPLIT 77X108 STRL (DRAPES)
DRAPE PROXIMA HALF (DRAPES) IMPLANT
DRAPE SURG ORHT 6 SPLT 77X108 (DRAPES) IMPLANT
DRAPE U-SHAPE 47X51 STRL (DRAPES) ×3 IMPLANT
DRSG PAD ABDOMINAL 8X10 ST (GAUZE/BANDAGES/DRESSINGS) ×6 IMPLANT
DURAPREP 26ML APPLICATOR (WOUND CARE) ×3 IMPLANT
ELECT CAUTERY BLADE 6.4 (BLADE) ×3 IMPLANT
ELECT REM PT RETURN 9FT ADLT (ELECTROSURGICAL) ×3
ELECTRODE REM PT RTRN 9FT ADLT (ELECTROSURGICAL) ×2 IMPLANT
GAUZE SPONGE 4X4 12PLY STRL (GAUZE/BANDAGES/DRESSINGS) ×3 IMPLANT
GLOVE BIOGEL PI IND STRL 8 (GLOVE) ×2 IMPLANT
GLOVE BIOGEL PI INDICATOR 8 (GLOVE) ×1
GLOVE BIOGEL PI ORTHO PRO 7.5 (GLOVE) ×1
GLOVE BIOGEL PI ORTHO PRO SZ8 (GLOVE) ×1
GLOVE PI ORTHO PRO STRL 7.5 (GLOVE) ×2 IMPLANT
GLOVE PI ORTHO PRO STRL SZ8 (GLOVE) ×2 IMPLANT
GLOVE SURG ORTHO 8.0 STRL STRW (GLOVE) ×6 IMPLANT
GOWN STRL REUS W/ TWL LRG LVL3 (GOWN DISPOSABLE) ×2 IMPLANT
GOWN STRL REUS W/ TWL XL LVL3 (GOWN DISPOSABLE) ×2 IMPLANT
GOWN STRL REUS W/TWL 2XL LVL3 (GOWN DISPOSABLE) ×3 IMPLANT
GOWN STRL REUS W/TWL LRG LVL3 (GOWN DISPOSABLE) ×1
GOWN STRL REUS W/TWL XL LVL3 (GOWN DISPOSABLE) ×1
HANDPIECE INTERPULSE COAX TIP (DISPOSABLE) ×1
HOOD PEEL AWAY FACE SHEILD DIS (HOOD) ×9 IMPLANT
IMMOBILIZER KNEE 22 UNIV (SOFTGOODS) ×3 IMPLANT
KIT BASIN OR (CUSTOM PROCEDURE TRAY) ×3 IMPLANT
KIT ROOM TURNOVER OR (KITS) ×3 IMPLANT
MANIFOLD NEPTUNE II (INSTRUMENTS) ×3 IMPLANT
NEEDLE HYPO 21X1.5 SAFETY (NEEDLE) IMPLANT
NS IRRIG 1000ML POUR BTL (IV SOLUTION) ×3 IMPLANT
PACK TOTAL JOINT (CUSTOM PROCEDURE TRAY) ×3 IMPLANT
PACK UNIVERSAL I (CUSTOM PROCEDURE TRAY) ×3 IMPLANT
PAD ABD 8X10 STRL (GAUZE/BANDAGES/DRESSINGS) ×3 IMPLANT
PAD ARMBOARD 7.5X6 YLW CONV (MISCELLANEOUS) ×6 IMPLANT
PAD CAST 4YDX4 CTTN HI CHSV (CAST SUPPLIES) ×2 IMPLANT
PADDING CAST COTTON 4X4 STRL (CAST SUPPLIES) ×1
PADDING CAST COTTON 6X4 STRL (CAST SUPPLIES) ×3 IMPLANT
SET HNDPC FAN SPRY TIP SCT (DISPOSABLE) ×2 IMPLANT
SPONGE GAUZE 4X4 12PLY STER LF (GAUZE/BANDAGES/DRESSINGS) ×3 IMPLANT
SUCTION FRAZIER TIP 10 FR DISP (SUCTIONS) ×3 IMPLANT
SUT MNCRL AB 4-0 PS2 18 (SUTURE) IMPLANT
SUT VIC AB 0 CT1 27 (SUTURE) ×1
SUT VIC AB 0 CT1 27XBRD ANBCTR (SUTURE) ×2 IMPLANT
SUT VIC AB 1 CT1 27 (SUTURE) ×1
SUT VIC AB 1 CT1 27XBRD ANBCTR (SUTURE) ×2 IMPLANT
SUT VIC AB 3-0 SH 8-18 (SUTURE) ×3 IMPLANT
SYR CONTROL 10ML LL (SYRINGE) IMPLANT
TOWEL OR 17X24 6PK STRL BLUE (TOWEL DISPOSABLE) ×3 IMPLANT
TOWEL OR 17X26 10 PK STRL BLUE (TOWEL DISPOSABLE) ×3 IMPLANT
WATER STERILE IRR 1000ML POUR (IV SOLUTION) ×3 IMPLANT

## 2014-12-06 NOTE — Progress Notes (Signed)
RT note: Patient remains on CPAP and is resting well.

## 2014-12-06 NOTE — Progress Notes (Signed)
Orthopedic Tech Progress Note Patient Details:  Richard [name] 1960/05/13 [id number]  Ortho Devices Type of Ortho Device: Knee Immobilizer Ortho Device/Splint Interventions: Application   [name] 12/06/2014, 2:20 PM

## 2014-12-06 NOTE — Progress Notes (Signed)
Talked with Caryl Pina, RRT concerning the patient, he was wanting his CPAP now instead of at bedtime.  His End tidal C02 could not be monitored and the reason can be found in Ashley's note concerning the setting needed in order for his O2 stats to remain above 90%.  Once the patient was awake and ready to eat dinner, he removed his CPAP and O2 and his stats remained at 90 to 94% without O2.  He stated he would put his CPAP back on once he was done with his dinner.

## 2014-12-06 NOTE — H&P (Signed)
PREOPERATIVE H&P  Chief Complaint: djd left knee  HPI: Richard [name] is a 54 y.o. male who presents for preoperative history and physical with a diagnosis of djd left knee. Symptoms are rated as moderate to severe, and have been worsening.  This is significantly impairing activities of daily living.  He has elected for surgical management.   He has failed injections, activity modification, anti-inflammatories, and assistive devices.  Preoperative X-rays demonstrate end stage degenerative changes with osteophyte formation, loss of joint space, subchondral sclerosis.  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  Past Medical History  Diagnosis Date  . Obesity   . Hypertension 08/17/2011  . Chest pain 08/16/2011    MI ruled out.  Marland Kitchen LVH (left ventricular hypertrophy) 08/17/2011    Per 2-D echocardiogram.  . GERD (gastroesophageal reflux disease) 08/17/2011  . Arthritis   . Urine incontinence   . Primary localized osteoarthritis of right knee 02/25/2014  . Dysrhythmia     TACHY  . OSA (obstructive sleep apnea)     cpap   Past Surgical History  Procedure Laterality Date  . Appendectomy    . Orthopedic surgery Left     arthroscopy x3  . Left heart catheterization with coronary angiogram N/A 01/12/2014    Procedure: LEFT HEART CATHETERIZATION WITH CORONARY ANGIOGRAM;  Surgeon: Jettie Booze, MD;  Location: [facility] CATH LAB;  Service: Cardiovascular;  Laterality: N/A;  . Partial knee arthroplasty Right 02/25/2014    Procedure: RIGHT KNEE ARTHROPLASTY CONDYLE AND PLATEAU MEDIAL COMPARTMENT ;  Surgeon: Johnny Bridge, MD;  Location: [facility];  Service: Orthopedics;  Laterality: Right;  . Joint replacement     Social History   Social History  . Marital Status: Married    Spouse Name: N/A  . Number of Children: N/A  . Years of Education:  N/A   Occupational History  . tiler    Social History Main Topics  . Smoking status: Never Smoker   . Smokeless tobacco: Never Used  . Alcohol Use: Yes     Comment: occassional  . Drug Use: No  . Sexual Activity: Yes   Other Topics Concern  . None   Social History Narrative   Family History  Problem Relation Age of Onset  . Coronary artery disease Mother   . Hyperlipidemia Mother   . Hypertension Mother   . Arthritis Mother   . Coronary artery disease Father   . Heart disease Paternal Grandmother   . Alzheimer's disease Paternal Grandfather    Allergies  Allergen Reactions  . Penicillins Other (See Comments)    REACTION: UNKNOWN Received 3 Gms Ancef IV preop 2 with no obvious reaction.   Prior to Admission medications   Medication Sig Start Date End Date Taking? Authorizing Provider  aspirin 325 MG tablet Take 325 mg by mouth daily.   Yes Historical Provider, MD  baclofen (LIORESAL) 10 MG tablet Take 1 tablet (10 mg total) by mouth 3 (three) times daily. As needed for muscle spasm Patient taking differently: Take 10 mg by mouth as needed. As needed for muscle spasm 02/25/14  Yes Marchia Bond, MD  meloxicam (MOBIC) 7.5 MG tablet Take 7.5 mg by mouth 2 (two) times daily as needed for pain.   Yes Historical Provider, MD  metoprolol succinate (TOPROL-XL) 25 MG 24 hr tablet Take 0.5 tablets (12.5  mg total) by mouth 2 (two) times daily. 10/18/14  Yes Jettie Booze, MD  traMADol (ULTRAM) 50 MG tablet Take 1 tablet (50 mg total) by mouth 2 (two) times daily. 09/20/14  Yes Jearld Fenton, NP  aspirin EC 81 MG EC tablet Take 1 tablet (81 mg total) by mouth daily. Patient not taking: Reported on 11/24/2014 10/18/14   Jettie Booze, MD  esomeprazole (NEXIUM) 40 MG capsule Take 40 mg by mouth daily as needed (reflux).     Historical Provider, MD     Positive ROS: All other systems have been reviewed and were otherwise negative with the exception of those mentioned in the HPI  and as above.  Physical Exam: General: Alert, no acute distress Cardiovascular: No pedal edema Respiratory: No cyanosis, no use of accessory musculature GI: No organomegaly, abdomen is soft and non-tender Skin: No lesions in the area of chief complaint Neurologic: Sensation intact distally Psychiatric: Patient is competent for consent with normal mood and affect Lymphatic: No axillary or cervical lymphadenopathy  MUSCULOSKELETAL: Left knee range of motion is 0-130 of intact stability, positive pseudo-laxity, positive medial crepitance, no patellar grind, no pain laterally.  Assessment: djd left knee   Plan: Plan for Procedure(s): UNICOMPARTMENTAL KNEE  The risks benefits and alternatives were discussed with the patient including but not limited to the risks of nonoperative treatment, versus surgical intervention including infection, bleeding, nerve injury,  blood clots, cardiopulmonary complications, morbidity, mortality, among others, and they were willing to proceed.   Johnny Bridge, MD Cell [phone]   12/06/2014 6:31 AM

## 2014-12-06 NOTE — Progress Notes (Signed)
[place] patient's insurance uses to set up home health, requested HHPT be set up with start of service 12/08/14. Faxed H and P, op note, PT note and demographics. Received fax confirmation. Will continue to follow to set up discharge needs.

## 2014-12-06 NOTE — Progress Notes (Signed)
Utilization review completed.  

## 2014-12-06 NOTE — Progress Notes (Signed)
O2 sat 94-97% on 4L [place] when pt awake. He sometimes desats to 80's when he falls asleep.  Dr Lissa Hoard here to see pt, OK to send pt to 5N with cont. O2 sat and ETCO2 monitoring. Pt instructed in use of IS- pulls 1000- 1200 cc. Will cont to monitor closely.

## 2014-12-06 NOTE — Progress Notes (Signed)
Patient had to come of the continuous CO2 monitor in order to wear his cpap so he could sleep. Patient was very sleepy and insisted he where his CPAP. Patient is on 8 cm H2O and has 6L of O2 bled in his current oxygen level is 92%. RT will continue to monitor.

## 2014-12-06 NOTE — Progress Notes (Deleted)
RT was called to patients room to place patient on CPAP due to excessive sleepiness after his OR procedure. RT placed patient on cpap at 8cm H2O with 6L bled in. Patient was tolerating well. RT will continue to monitor.

## 2014-12-06 NOTE — Op Note (Signed)
DATE OF SURGERY:  12/06/2014 TIME: 9:52 AM  PATIENT NAME:  Richard [name]   AGE: 54 y.o.    PRE-OPERATIVE DIAGNOSIS:  Left knee primary localized osteoarthritis  POST-OPERATIVE DIAGNOSIS:  Left knee primary localized osteoarthritis, with grade 4 loss of the lateral femoral trochlea and pitting lesions on the lateral femoral condyle centrally  PROCEDURE:  Procedure(s): Left total knee replacement   SURGEON:  Johnny Bridge, MD   ASSISTANT:  Joya Gaskins, OPA-C, present and scrubbed throughout the case, critical for assistance with exposure, retraction, instrumentation, and closure.   OPERATIVE IMPLANTS: Depuy PFC Sigma, Posterior Stabilized.  Femur size 5, Tibia size 4, Patella size 41 3-peg oval button, with a 10 mm polyethylene insert.   PREOPERATIVE INDICATIONS:  Richard [name] is a 54 y.o. year old male with end stage bone on bone degenerative arthritis of the knee who failed conservative treatment, including injections, antiinflammatories, activity modification, and assistive devices, and had significant impairment of their activities of daily living, and elected for Total Knee Arthroplasty. We discussed the potential for partial knee replacement versus total knee replacement depending on intraoperative findings, I was concerned because of progressive changes in his patellofemoral joint as seen on radiographic images.  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  The risks, benefits, and alternatives were discussed at length including but not limited to the risks of infection, bleeding, nerve injury, stiffness, blood clots, the need for revision surgery, cardiopulmonary complications, among others, and they were willing to proceed. We also discussed the fact that he will not likely ever be  able to lay tile again, and that regardless of partial versus total knee replacement, it is routine for replaced knees to be slightly stiffer in deep flexion then native knees.  OPERATIVE FINDINGS AND UNIQUE ASPECTS OF THE CASE:  The medial femoral condyle and tibial condyle had extensive grade 4 changes with osteophyte formation throughout the knee. The undersurface of the patella still had reasonably good articular cartilage, but there was an extensive uncontained 3 x 3 cm chondral loss that was on the lateral femoral trochlea. The lateral femoral condyle also had an area of central and lateral pitting that was nearly full-thickness. For these reasons I did not think that he was a candidate for partial knee replacement, despite the fact that he has one that is doing reasonably well on the other side.  Access to the knee was extremely difficult and required a fairly significant extension up high into the quadriceps tendon in order to mobilize the patella. There was a lot of anterior prepatellar bursal thickening and hypertrophy as well. I had cut the distal femur twice, my first cut was made at 10 mm. The patella tracked well at the end of the case with a no thumb type technique.  OPERATIVE DESCRIPTION:  The patient was brought to the operative room and placed in a supine position.  General anesthesia was administered.  IV antibiotics were given.  The lower extremity was prepped and draped in the usual sterile fashion.  Time out was performed.  The leg was elevated and exsanguinated and the tourniquet was inflated.  Anterior quadriceps tendon splitting approach was performed.  Initially I had the leg in the leg holder, but after evaluating the articular cartilage, I moved to a total  knee replacement configuration. I extended the incision proximally, and extended the quadriceps split, the patella was everted and osteophytes were removed.  The anterior horn of the medial and lateral meniscus was removed.    The distal femur was opened with the drill and the intramedullary distal femoral cutting jig was utilized, set at 5 degrees resecting 10 mm off the distal femur.  Care was taken to protect the collateral ligaments.  Then the extramedullary tibial cutting jig was utilized making the appropriate cut using the anterior tibial crest as a reference building in appropriate posterior slope.  Care was taken during the cut to protect the medial and collateral ligaments.  The proximal tibia was removed along with the posterior horns of the menisci.  The PCL was sacrificed.    The extensor gap was measured and was approximately 50mm.    The distal femoral sizing jig was applied, taking care to avoid notching.  Then the 4-in-1 cutting jig was applied and the anterior and posterior femur was cut, along with the chamfer cuts.  All posterior osteophytes were removed.  The flexion gap was then measured and was symmetric with the extension gap.  I completed the distal femoral preparation using the appropriate jig to prepare the box.  The patella was then measured, and cut with the saw.  The thickness before the cut was 28 and after the cut was 16. I did have to cut the patella freehand some degree because it did not conform smoothly to the clamp, and I had a slightly increased gap on the medial side, which I filled with cement during implantation.  The proximal tibia sized and prepared accordingly with the reamer and the punch, and then all components were trialed with the 81mm poly insert.  The knee was found to have excellent balance and full motion.    The above named components were then cemented into place and all excess cement was removed.  The real polyethylene implant was placed.  After the cement had cured I released the tourniquet and confirmed excellent hemostasis with no major posterior vessel injury.    The knee was easily taken through a range of motion and the patella tracked well and the knee  irrigated copiously and the parapatellar and subcutaneous tissue closed with vicryl, and monocryl with steri strips for the skin.  The wounds were injected with marcaine, and dressed with sterile gauze and the patient was awakened and returned to the PACU in stable and satisfactory condition.  There were no complications.  Total tourniquet time was 110 minutes.

## 2014-12-06 NOTE — Transfer of Care (Signed)
Immediate Anesthesia Transfer of Care Note  Patient: Richard [name]  Procedure(s) Performed: Procedure(s): TOTAL KNEE ARTHROPLASTY  Patient Location: PACU  Anesthesia Type:MAC  Level of Consciousness: sedated  Airway & Oxygen Therapy: Patient Spontanous Breathing and Patient connected to face mask oxygen  Post-op Assessment: Report given to RN, Post -op Vital signs reviewed and stable and Patient moving all extremities X 4  Post vital signs: Reviewed and stable  Last Vitals:  Filed Vitals:   12/06/14 0701  BP: 148/69  Pulse: 73  Temp:   Resp:     Complications: No apparent anesthesia complications

## 2014-12-06 NOTE — Anesthesia Postprocedure Evaluation (Signed)
Anesthesia Post Note  Patient: Richard [name]  Procedure(s) Performed: Procedure(s): TOTAL KNEE ARTHROPLASTY  Anesthesia type: General  Patient location: PACU  Post pain: Pain level controlled  Post assessment: Post-op Vital signs reviewed  Last Vitals: BP 147/83 mmHg  Pulse 65  Temp(Src) 36.4 C (Oral)  Resp 15  Ht 5\' 9"  (1.753 m)  Wt 318 lb (144.244 kg)  BMI 46.94 kg/m2  SpO2 95%  Post vital signs: Reviewed  Level of consciousness: sedated  Complications: No apparent anesthesia complications

## 2014-12-06 NOTE — Evaluation (Signed)
Physical Therapy Evaluation Patient Details Name: Richard [name] MRN: [id number] DOB: 1960/07/29 Today's Date: 12/06/2014   History of Present Illness  Admitted for LTKA;  has a past medical history of Obesity; Hypertension (08/17/2011); Chest pain (08/16/2011); LVH (left ventricular hypertrophy) (08/17/2011);  has past surgical history that includes  orthopedic surgery (Left); left heart catheterization with coronary angiogram (N/A, 01/12/2014); Partial knee arthroplasty (Right, 02/25/2014); and Joint replacement.  Clinical Impression   Pt is s/p TKA resulting in the deficits listed below (see PT Problem List).  Pt will benefit from skilled PT to increase their independence and safety with mobility to allow discharge to the venue listed below.      Follow Up Recommendations Home health PT;Supervision for mobility/OOB    Equipment Recommendations  Rolling walker with 5" wheels;3in1 (PT) (Bari sized)    Recommendations for Other Services OT consult     Precautions / Restrictions Precautions Precautions: Knee Precaution Comments: Pt educated to not allow any pillow or bolster under knee for healing with optimal range of motion.  Required Braces or Orthoses: Knee Immobilizer - Left Knee Immobilizer - Left: On when out of bed or walking Restrictions Weight Bearing Restrictions: Yes LLE Weight Bearing: Weight bearing as tolerated      Mobility  Bed Mobility Overal bed mobility: Needs Assistance Bed Mobility: Supine to Sit;Sit to Supine     Supine to sit: Min assist Sit to supine: Mod assist   General bed mobility comments: min handheld assist to pull to sit; Cues for technique; increased time and use of rails; mod assist to help both LEs back into bed  Transfers Overall transfer level: Needs assistance Equipment used: Rolling walker (2 wheeled) Transfers: Sit to/from Stand Sit to Stand: Mod assist         General transfer comment: Light mod assist to power up to stand;  cues for technique, safety and hand placement  Ambulation/Gait             General Gait Details: Pt politely declined amb  Stairs            Wheelchair Mobility    Modified Rankin (Stroke Patients Only)       Balance Overall balance assessment: Needs assistance Sitting-balance support: Bilateral upper extremity supported;No upper extremity supported Sitting balance-Leahy Scale: Good     Standing balance support: Bilateral upper extremity supported Standing balance-Leahy Scale: Poor Standing balance comment: Stood at bedside with bil UE support on RW; tested L knee stability in single limb stance x 3 bouts (KI on) tolerated well                             Pertinent Vitals/Pain Pain Assessment: 0-10 Pain Score: 5  Pain Location: L knee Pain Descriptors / Indicators: Aching Pain Intervention(s): Limited activity within patient's tolerance;Monitored during session;Repositioned    Home Living Family/patient expects to be discharged to:: Private residence Living Arrangements: Other (Comment) (Pt's father) Available Help at Discharge: Family Type of Home: Mobile home Home Access: Stairs to enter Entrance Stairs-Rails: Right (pt does not anticipate problems getting up steps) Entrance Stairs-Number of Steps: 3 Home Layout: One level Home Equipment: Walker - 2 wheels;Cane - quad      Prior Function Level of Independence: Independent               Hand Dominance        Extremity/Trunk Assessment   Upper Extremity Assessment: Overall WFL for tasks assessed  Lower Extremity Assessment: LLE deficits/detail   LLE Deficits / Details: Decr AROM and strength, limited by pain postop; Able to actively flex to approx 40 deg; needs assist fo rstraight leg raise     Communication   Communication: No difficulties  Cognition Arousal/Alertness: Awake/alert Behavior During Therapy: WFL for tasks assessed/performed Overall Cognitive  Status: Within Functional Limits for tasks assessed                      General Comments General comments (skin integrity, edema, etc.): on 6 L supplemental O2, and sats ranged 90-94%    Exercises Total Joint Exercises Quad Sets: AROM;Left;10 reps Heel Slides: AAROM;Left;5 reps Straight Leg Raises: AAROM;Left;5 reps      Assessment/Plan    PT Assessment Patient needs continued PT services  PT Diagnosis Difficulty walking;Acute pain   PT Problem List Decreased strength;Decreased range of motion;Decreased activity tolerance;Decreased balance;Decreased mobility;Decreased knowledge of use of DME;Pain;Decreased knowledge of precautions  PT Treatment Interventions DME instruction;Gait training;Stair training;Functional mobility training;Therapeutic activities;Therapeutic exercise;Patient/family education   PT Goals (Current goals can be found in the Care Plan section) Acute Rehab PT Goals Patient Stated Goal: decr knee pain PT Goal Formulation: With patient Time For Goal Achievement: 12/13/14 Potential to Achieve Goals: Good    Frequency 7X/week   Barriers to discharge        Co-evaluation               End of Session Equipment Utilized During Treatment: Gait belt;Left knee immobilizer Activity Tolerance: Patient tolerated treatment well Patient left: in bed;with call bell/phone within reach;with family/visitor present Nurse Communication: Mobility status         Time: 1500-1530 PT Time Calculation (min) (ACUTE ONLY): 30 min   Charges:   PT Evaluation $Initial PT Evaluation Tier I: 1 Procedure PT Treatments $Therapeutic Activity: 8-22 mins   PT G Codes:        Quin Hoop 12/06/2014, 4:20 PM  [facility] [place], [place]  Acute Rehabilitation Services Pager [phone] Office [phone]

## 2014-12-06 NOTE — Anesthesia Procedure Notes (Addendum)
Procedure Name: Intubation Date/Time: 12/06/2014 7:36 AM Performed by: Neldon Newport Pre-anesthesia Checklist: Patient being monitored, Suction available, Emergency Drugs available, Patient identified and Timeout performed Patient Re-evaluated:Patient Re-evaluated prior to inductionOxygen Delivery Method: Circle system utilized Preoxygenation: Pre-oxygenation with 100% oxygen Intubation Type: IV induction and Rapid sequence Ventilation: Mask ventilation without difficulty Laryngoscope Size: Mac and 3 Grade View: Grade II Tube type: Oral Tube size: 7.5 mm Number of attempts: 1 Placement Confirmation: positive ETCO2,  ETT inserted through vocal cords under direct vision and breath sounds checked- equal and bilateral Secured at: 23 cm Tube secured with: Tape Dental Injury: Teeth and Oropharynx as per pre-operative assessment    Anesthesia Regional Block:  Femoral nerve block  Pre-Anesthetic Checklist: ,, timeout performed, Correct Patient, Correct Site, Correct Laterality, Correct Procedure, Correct Position, site marked, Risks and benefits discussed, Surgical consent,  Pre-op evaluation,  Post-op pain management  Laterality: Left  Prep: chloraprep       Needles:  Injection technique: Single-shot  Needle Type: Stimulator Needle - 80     Needle Length: 9cm 9 cm Needle Gauge: 22 and 22 G  Needle insertion depth: 6 cm   Additional Needles:  Procedures: ultrasound guided (picture in chart) Femoral nerve block  Nerve Stimulator or Paresthesia:  Response: Patellar snap, 0.5 mA,   Additional Responses:   Narrative:  Injection made incrementally with aspirations every 5 mL.  Performed by: Personally  Anesthesiologist: Nolon Nations  Additional Notes: BP cuff, EKG monitors applied. Sedation begun. Femoral artery palpated for location of nerve. After nerve location verified with U/S, anesthetic injected incrementally, slowly, and after negative aspirations under  direct u/s guidance. Good perineural spread. Patient tolerated well.

## 2014-12-07 ENCOUNTER — Encounter (HOSPITAL_COMMUNITY): Payer: Self-pay | Admitting: Orthopedic Surgery

## 2014-12-07 LAB — BASIC METABOLIC PANEL
Anion gap: 6 (ref 5–15)
BUN: 21 mg/dL — ABNORMAL HIGH (ref 6–20)
CO2: 28 mmol/L (ref 22–32)
Calcium: 8.2 mg/dL — ABNORMAL LOW (ref 8.9–10.3)
Chloride: 104 mmol/L (ref 101–111)
Creatinine, Ser: 1.29 mg/dL — ABNORMAL HIGH (ref 0.61–1.24)
GFR calc Af Amer: >60 mL/min (ref >60)
GFR calc non Af Amer: >60 mL/min (ref >60)
Glucose, Bld: 117 mg/dL — ABNORMAL HIGH (ref 65–99)
Potassium: 4.2 mmol/L (ref 3.5–5.1)
Sodium: 138 mmol/L (ref 135–145)

## 2014-12-07 LAB — CBC
HCT: 40.5 % (ref 39.0–52.0)
Hemoglobin: 13.1 g/dL (ref 13.0–17.0)
MCH: 29.2 pg (ref 26.0–34.0)
MCHC: 32.3 g/dL (ref 30.0–36.0)
MCV: 90.4 fL (ref 78.0–100.0)
Platelets: 169 10*3/uL (ref 150–400)
RBC: 4.48 MIL/uL (ref 4.22–5.81)
RDW: 13.7 % (ref 11.5–15.5)
WBC: 9.5 10*3/uL (ref 4.0–10.5)

## 2014-12-07 NOTE — Discharge Instructions (Signed)
INSTRUCTIONS AFTER JOINT REPLACEMENT  ° °o Remove items at home which could result in a fall. This includes throw rugs or furniture in walking pathways °o ICE to the affected joint every three hours while awake for 30 minutes at a time, for at least the first 3-5 days, and then as needed for pain and swelling.  Continue to use ice for pain and swelling. You may notice swelling that will progress down to the foot and ankle.  This is normal after surgery.  Elevate your leg when you are not up walking on it.   °o Continue to use the breathing machine you got in the hospital (incentive spirometer) which will help keep your temperature down.  It is common for your temperature to cycle up and down following surgery, especially at night when you are not up moving around and exerting yourself.  The breathing machine keeps your lungs expanded and your temperature down. ° ° °DIET:  As you were doing prior to hospitalization, we recommend a well-balanced diet. ° °DRESSING / WOUND CARE / SHOWERING ° °You may change your dressing 3-5 days after surgery.  Then change the dressing every day with sterile gauze.  Please use good hand washing techniques before changing the dressing.  Do not use any lotions or creams on the incision until instructed by your surgeon. ° °ACTIVITY ° °o Increase activity slowly as tolerated, but follow the weight bearing instructions below.   °o No driving for 6 weeks or until further direction given by your physician.  You cannot drive while taking narcotics.  °o No lifting or carrying greater than 10 lbs. until further directed by your surgeon. °o Avoid periods of inactivity such as sitting longer than an hour when not asleep. This helps prevent blood clots.  °o You may return to work once you are authorized by your doctor.  ° ° ° °WEIGHT BEARING  ° °Weight bearing as tolerated with assist device (walker, cane, etc) as directed, use it as long as suggested by your surgeon or therapist, typically at  least 4-6 weeks. ° ° °EXERCISES ° °Results after joint replacement surgery are often greatly improved when you follow the exercise, range of motion and muscle strengthening exercises prescribed by your doctor. Safety measures are also important to protect the joint from further injury. Any time any of these exercises cause you to have increased pain or swelling, decrease what you are doing until you are comfortable again and then slowly increase them. If you have problems or questions, call your caregiver or physical therapist for advice.  ° °Rehabilitation is important following a joint replacement. After just a few days of immobilization, the muscles of the leg can become weakened and shrink (atrophy).  These exercises are designed to build up the tone and strength of the thigh and leg muscles and to improve motion. Often times heat used for twenty to thirty minutes before working out will loosen up your tissues and help with improving the range of motion but do not use heat for the first two weeks following surgery (sometimes heat can increase post-operative swelling).  ° °These exercises can be done on a training (exercise) mat, on the floor, on a table or on a bed. Use whatever works the best and is most comfortable for you.    Use music or television while you are exercising so that the exercises are a pleasant break in your day. This will make your life better with the exercises acting as a break   in your routine that you can look forward to.   Perform all exercises about fifteen times, three times per day or as directed.  You should exercise both the operative leg and the other leg as well. ° °Exercises include: °  °• Quad Sets - Tighten up the muscle on the front of the thigh (Quad) and hold for 5-10 seconds.   °• Straight Leg Raises - With your knee straight (if you were given a brace, keep it on), lift the leg to 60 degrees, hold for 3 seconds, and slowly lower the leg.  Perform this exercise against  resistance later as your leg gets stronger.  °• Leg Slides: Lying on your back, slowly slide your foot toward your buttocks, bending your knee up off the floor (only go as far as is comfortable). Then slowly slide your foot back down until your leg is flat on the floor again.  °• Angel Wings: Lying on your back spread your legs to the side as far apart as you can without causing discomfort.  °• Hamstring Strength:  Lying on your back, push your heel against the floor with your leg straight by tightening up the muscles of your buttocks.  Repeat, but this time bend your knee to a comfortable angle, and push your heel against the floor.  You may put a pillow under the heel to make it more comfortable if necessary.  ° °A rehabilitation program following joint replacement surgery can speed recovery and prevent re-injury in the future due to weakened muscles. Contact your doctor or a physical therapist for more information on knee rehabilitation.  ° ° °CONSTIPATION ° °Constipation is defined medically as fewer than three stools per week and severe constipation as less than one stool per week.  Even if you have a regular bowel pattern at home, your normal regimen is likely to be disrupted due to multiple reasons following surgery.  Combination of anesthesia, postoperative narcotics, change in appetite and fluid intake all can affect your bowels.  ° °YOU MUST use at least one of the following options; they are listed in order of increasing strength to get the job done.  They are all available over the counter, and you may need to use some, POSSIBLY even all of these options:   ° °Drink plenty of fluids (prune juice may be helpful) and high fiber foods °Colace 100 mg by mouth twice a day  °Senokot for constipation as directed and as needed Dulcolax (bisacodyl), take with full glass of water  °Miralax (polyethylene glycol) once or twice a day as needed. ° °If you have tried all these things and are unable to have a bowel  movement in the first 3-4 days after surgery call either your surgeon or your primary doctor.   ° °If you experience loose stools or diarrhea, hold the medications until you stool forms back up.  If your symptoms do not get better within 1 week or if they get worse, check with your doctor.  If you experience "the worst abdominal pain ever" or develop nausea or vomiting, please contact the office immediately for further recommendations for treatment. ° ° °ITCHING:  If you experience itching with your medications, try taking only a single pain pill, or even half a pain pill at a time.  You can also use Benadryl over the counter for itching or also to help with sleep.  ° °TED HOSE STOCKINGS:  Use stockings on both legs until for at least 2 weeks or as   directed by physician office. They may be removed at night for sleeping. ° °MEDICATIONS:  See your medication summary on the “After Visit Summary” that nursing will review with you.  You may have some home medications which will be placed on hold until you complete the course of blood thinner medication.  It is important for you to complete the blood thinner medication as prescribed. ° °PRECAUTIONS:  If you experience chest pain or shortness of breath - call 911 immediately for transfer to the hospital emergency department.  ° °If you develop a fever greater that 101 F, purulent drainage from wound, increased redness or drainage from wound, foul odor from the wound/dressing, or calf pain - CONTACT YOUR SURGEON.   °                                                °FOLLOW-UP APPOINTMENTS:  If you do not already have a post-op appointment, please call the office for an appointment to be seen by your surgeon.  Guidelines for how soon to be seen are listed in your “After Visit Summary”, but are typically between 1-4 weeks after surgery. ° °OTHER INSTRUCTIONS:  ° °Knee Replacement:  Do not place pillow under knee, focus on keeping the knee straight while resting. CPM  instructions: 0-90 degrees, 2 hours in the morning, 2 hours in the afternoon, and 2 hours in the evening. Place foam block, curve side up under heel at all times except when in CPM or when walking.  DO NOT modify, tear, cut, or change the foam block in any way. ° °MAKE SURE YOU:  °• Understand these instructions.  °• Get help right away if you are not doing well or get worse.  ° ° °Thank you for letting us be a part of your medical care team.  It is a privilege we respect greatly.  We hope these instructions will help you stay on track for a fast and full recovery!  ° ° °Information on my medicine - XARELTO® (Rivaroxaban) ° °This medication education was reviewed with me or my healthcare representative as part of my discharge preparation.   ° °Why was Xarelto® prescribed for you? °Xarelto® was prescribed for you to reduce the risk of blood clots forming after orthopedic surgery. The medical term for these abnormal blood clots is venous thromboembolism (VTE). ° °What do you need to know about xarelto® ? °Take your Xarelto® 10 mg ONCE DAILY at the same time every day. °You may take it either with or without food. ° °If you have difficulty swallowing the tablet whole, you may crush it and mix in applesauce just prior to taking your dose. ° °Take Xarelto® exactly as prescribed by your doctor and DO NOT stop taking Xarelto® without talking to the doctor who prescribed the medication.  Stopping without other VTE prevention medication to take the place of Xarelto® may increase your risk of developing a clot. ° °After discharge, you should have regular check-up appointments with your healthcare provider that is prescribing your Xarelto®.   ° °What do you do if you miss a dose? °If you miss a dose, take it as soon as you remember on the same day then continue your regularly scheduled once daily regimen the next day. Do not take two doses of Xarelto® on the same day.  ° °Important Safety Information °A possible   side effect of  Xarelto® is bleeding. You should call your healthcare provider right away if you experience any of the following: °? Bleeding from an injury or your nose that does not stop. °? Unusual colored urine (red or dark brown) or unusual colored stools (red or black). °? Unusual bruising for unknown reasons. °? A serious fall or if you hit your head (even if there is no bleeding). ° °Some medicines may interact with Xarelto® and might increase your risk of bleeding while on Xarelto®. To help avoid this, consult your healthcare provider or pharmacist prior to using any new prescription or non-prescription medications, including herbals, vitamins, non-steroidal anti-inflammatory drugs (NSAIDs) and supplements. ° °This website has more information on Xarelto®: www.xarelto.com. ° ° ° °

## 2014-12-07 NOTE — Progress Notes (Signed)
Physical Therapy Treatment Patient Details Name: Richard [name] MRN: [id number] DOB: 01/25/1960 Today's Date: 12/07/2014    History of Present Illness Admitted for LTKA;  has a past medical history of Obesity; Hypertension (08/17/2011); Chest pain (08/16/2011); LVH (left ventricular hypertrophy) (08/17/2011);  has past surgical history that includes  orthopedic surgery (Left); left heart catheterization with coronary angiogram (N/A, 01/12/2014); Partial knee arthroplasty (Right, 02/25/2014); and Joint replacement.    PT Comments    Noting very nice progress with mobility, with nice, stable L knee in stance after initial practice; Plan for stair training next session; On track for dc home tomorrow  Follow Up Recommendations  Home health PT;Supervision for mobility/OOB     Equipment Recommendations  Rolling walker with 5" wheels;3in1 (PT) (Bari sized)    Recommendations for Other Services OT consult     Precautions / Restrictions Precautions Precautions: Knee Precaution Booklet Issued: Yes (comment) Precaution Comments: Pt educated to not allow any pillow or bolster under knee for healing with optimal range of motion.  Required Braces or Orthoses: Knee Immobilizer - Left Knee Immobilizer - Left: On when out of bed or walking (Was dc'd) Restrictions Weight Bearing Restrictions: Yes LLE Weight Bearing: Weight bearing as tolerated    Mobility  Bed Mobility Overal bed mobility: Needs Assistance Bed Mobility: Supine to Sit     Supine to sit: Min assist     General bed mobility comments: Min handheld assist to pull to sit  Transfers Overall transfer level: Needs assistance Equipment used: Rolling walker (2 wheeled) Transfers: Sit to/from Stand Sit to Stand: Mod assist         General transfer comment: Very Light mod assist to power up to stand; cues for technique, safety and hand placement  Ambulation/Gait Ambulation/Gait assistance: Min guard (with and without physical  contact) Ambulation Distance (Feet): 75 Feet Assistive device: Rolling walker (2 wheeled) Gait Pattern/deviations: Decreased stance time - left;Decreased step length - right Gait velocity: quite slow   General Gait Details: Cues for gait sequence and to activate L quad for stance stability   Stairs            Wheelchair Mobility    Modified Rankin (Stroke Patients Only)       Balance                                    Cognition Arousal/Alertness: Awake/alert Behavior During Therapy: WFL for tasks assessed/performed Overall Cognitive Status: Within Functional Limits for tasks assessed                      Exercises Total Joint Exercises Ankle Circles/Pumps: AROM;Both;10 reps Quad Sets: AROM;Left;10 reps Heel Slides: AAROM;Left;10 reps Straight Leg Raises: AAROM;Left;10 reps Goniometric ROM: approx 0-50    General Comments General comments (skin integrity, edema, etc.): Session conducted on Room Air and O2 sats reamined greater than or equal to 90%      Pertinent Vitals/Pain Pain Assessment: Faces Faces Pain Scale: Hurts whole lot Pain Location: L knee when pushing Flexion Pain Descriptors / Indicators: Aching;Grimacing;Sore Pain Intervention(s): Limited activity within patient's tolerance;Monitored during session;Repositioned    Home Living                      Prior Function            PT Goals (current goals can now be found in the care plan  section) Acute Rehab PT Goals Patient Stated Goal: decr knee pain PT Goal Formulation: With patient Time For Goal Achievement: 12/13/14 Potential to Achieve Goals: Good Progress towards PT goals: Progressing toward goals    Frequency  7X/week    PT Plan Current plan remains appropriate    Co-evaluation             End of Session Equipment Utilized During Treatment: Gait belt Activity Tolerance: Patient tolerated treatment well Patient left: in chair;with call  bell/phone within reach;with family/visitor present     Time: [id number] PT Time Calculation (min) (ACUTE ONLY): 38 min  Charges:  $Gait Training: 8-22 mins $Therapeutic Exercise: 8-22 mins $Therapeutic Activity: 8-22 mins                    G Codes:      Roney Marion Hamff 12/07/2014, 11:11 AM  Roney Marion, [place] Pager [phone] Office [phone]

## 2014-12-07 NOTE — Progress Notes (Signed)
Pt placed self on cpap for the night, PT tolerating well at this time

## 2014-12-07 NOTE — Progress Notes (Addendum)
Patient ID: Richard [name], male   DOB: 06/28/1960, 54 y.o.   MRN: [id number]     Subjective:  Patient reports pain as mild to moderate.  Patient denies any CP or SOB.  In bed and in no acute distress  Objective:   VITALS:   Filed Vitals:   12/06/14 2102 12/06/14 2142 12/07/14 0210 12/07/14 0633  BP:  154/80 149/80 137/77  Pulse: 71 77 73 74  Temp:  98.9 F (37.2 C) 98.4 F (36.9 C) 98.2 F (36.8 C)  TempSrc:  Oral Oral Oral  Resp: 16 18 18 18   Height:      Weight:      SpO2: 93% 95% 95% 95%    ABD soft Sensation intact distally Dorsiflexion/Plantar flexion intact Incision: dressing C/D/I and scant drainage Good foot and ankle motion  Lab Results  Component Value Date   WBC 9.5 12/07/2014   HGB 13.1 12/07/2014   HCT 40.5 12/07/2014   MCV 90.4 12/07/2014   PLT 169 12/07/2014   BMET    Component Value Date/Time   NA 138 12/07/2014 0518   K 4.2 12/07/2014 0518   CL 104 12/07/2014 0518   CO2 28 12/07/2014 0518   GLUCOSE 117* 12/07/2014 0518   BUN 21* 12/07/2014 0518   CREATININE 1.29* 12/07/2014 0518   CALCIUM 8.2* 12/07/2014 0518   GFRNONAA >60 12/07/2014 0518   GFRAA >60 12/07/2014 0518     Assessment/Plan: 1 Day Post-Op   Principal Problem:   Primary localized osteoarthritis of left knee Active Problems:   Morbid obesity ([place])   Knee osteoarthritis   Advance diet Up with therapy Plan for discharge tomorrow WBAT Dry dressing PRN     [name] 12/07/2014, 7:20 AM  Seen and agree with above.   Marchia Bond, MD Cell [phone]

## 2014-12-07 NOTE — Progress Notes (Signed)
Physical Therapy Treatment Patient Details Name: Richard [name] MRN: [id number] DOB: 1961/01/21 Today's Date: 12/07/2014    History of Present Illness Admitted for LTKA;  has a past medical history of Obesity; Hypertension (08/17/2011); Chest pain (08/16/2011); LVH (left ventricular hypertrophy) (08/17/2011);  has past surgical history that includes  orthopedic surgery (Left); left heart catheterization with coronary angiogram (N/A, 01/12/2014); Partial knee arthroplasty (Right, 02/25/2014); and Joint replacement.    PT Comments    Continuing progress with mobility; Yosgart tells me that he typically uses crutches to go up the stairs to enter his home; will update stair goal to reflect this  Follow Up Recommendations  Home health PT;Supervision for mobility/OOB     Equipment Recommendations  Rolling walker with 5" wheels;3in1 (PT) (Bari sized)    Recommendations for Other Services OT consult     Precautions / Restrictions Precautions Precautions: Knee Precaution Booklet Issued: Yes (comment) Precaution Comments: Pt educated to not allow any pillow or bolster under knee for healing with optimal range of motion.  Required Braces or Orthoses: Knee Immobilizer - Left Knee Immobilizer - Left: On when out of bed or walking (Was dc'd) Restrictions Weight Bearing Restrictions: No LLE Weight Bearing: Weight bearing as tolerated    Mobility  Bed Mobility Overal bed mobility: Needs Assistance Bed Mobility: Sit to Supine     Supine to sit: Min assist     General bed mobility comments: Min assist to get LEs into bed; noted valsalva move  Transfers Overall transfer level: Needs assistance Equipment used: Rolling walker (2 wheeled) Transfers: Sit to/from Stand Sit to Stand: Min guard         General transfer comment: cues for technique, safety and hand placement  Ambulation/Gait Ambulation/Gait assistance: Min guard Ambulation Distance (Feet): 110 Feet Assistive device:  Rolling walker (2 wheeled) Gait Pattern/deviations: Step-to pattern Gait velocity: quite slow   General Gait Details: Cues for gait sequence and to activate L quad for stance stability; nice, stable L knee with no noted buckling   Stairs            Wheelchair Mobility    Modified Rankin (Stroke Patients Only)       Balance Overall balance assessment: Needs assistance   Sitting balance-Leahy Scale: Good       Standing balance-Leahy Scale: Poor Standing balance comment: Pt needs UE support in standing and with mobility.  Did not need assist from therapist to maintain standing however.                    Cognition Arousal/Alertness: Awake/alert Behavior During Therapy: WFL for tasks assessed/performed Overall Cognitive Status: Within Functional Limits for tasks assessed                      Exercises      General Comments        Pertinent Vitals/Pain Pain Assessment: Faces Pain Score: 8  Faces Pain Scale: Hurts even more Pain Location: L eft knee Pain Descriptors / Indicators: Aching;Sore Pain Intervention(s): Monitored during session;Limited activity within patient's tolerance    Home Living Family/patient expects to be discharged to:: Private residence Living Arrangements: Other (Comment) (Pt's father) Available Help at Discharge: Family Type of Home: Mobile home Home Access: Stairs to enter Entrance Stairs-Rails: Right (pt does not anticipate problems getting up steps) Home Layout: One level Home Equipment: Walker - 2 wheels;Cane - quad;Bedside commode      Prior Function Level of Independence: Independent  PT Goals (current goals can now be found in the care plan section) Acute Rehab PT Goals Patient Stated Goal: decr knee pain PT Goal Formulation: With patient Time For Goal Achievement: 12/13/14 Potential to Achieve Goals: Good Progress towards PT goals: Progressing toward goals    Frequency  7X/week    PT Plan  Current plan remains appropriate    Co-evaluation             End of Session Equipment Utilized During Treatment: Gait belt Activity Tolerance: Patient tolerated treatment well Patient left: with call bell/phone within reach;in bed     Time: [id number] PT Time Calculation (min) (ACUTE ONLY): 29 min  Charges:  $Gait Training: 8-22 mins $Therapeutic Activity: 8-22 mins                    G Codes:      Quin Hoop 12/07/2014, 4:31 PM  Roney Marion, [place] Pager [phone] Office [phone]

## 2014-12-07 NOTE — Evaluation (Signed)
Occupational Therapy Evaluation Patient Details Name: Richard [name] MRN: [id number] DOB: 1960-03-25 Today's Date: 12/07/2014    History of Present Illness Admitted for LTKA;  has a past medical history of Obesity; Hypertension (08/17/2011); Chest pain (08/16/2011); LVH (left ventricular hypertrophy) (08/17/2011);  has past surgical history that includes  orthopedic surgery (Left); left heart catheterization with coronary angiogram (N/A, 01/12/2014); Partial knee arthroplasty (Right, 02/25/2014); and Joint replacement.   Clinical Impression   Pt overall min guard assist for toilet transfers and sit to stand.  Demonstrates pre-existing difficulty with reaching either foot for dressing tasks.  Educated on use and availability of AE for LB selfcare as well.  He will have 24 hour supervision from family initially so do not feel he needs further OT at this time.      Follow Up Recommendations  No OT follow up;Supervision - Intermittent    Equipment Recommendations  None recommended by OT       Precautions / Restrictions Precautions Precautions: Knee Precaution Booklet Issued: Yes (comment) Precaution Comments:  Required Braces or Orthoses: Knee Immobilizer - Left Knee Immobilizer - Left: On when out of bed or walking (Was dc'd) Restrictions Weight Bearing Restrictions: No LLE Weight Bearing: Weight bearing as tolerated      Mobility Bed Mobility Overal bed mobility: Needs Assistance Bed Mobility: Supine to Sit     Supine to sit: Min assist     General bed mobility comments: Min handheld assist to pull to sit  Transfers Overall transfer level: Needs assistance Equipment used: Rolling walker (2 wheeled) Transfers: Sit to/from Stand Sit to Stand: Min guard         General transfer comment: Very Light mod assist to power up to stand; cues for technique, safety and hand placement    Balance Overall balance assessment: Needs assistance   Sitting balance-Leahy Scale: Good       Standing balance-Leahy Scale: Poor Standing balance comment: Pt needs UE support in standing and with mobility.  Did not need assist from therapist to maintain standing however.                            ADL Overall ADL's : Needs assistance/impaired Eating/Feeding: Independent;Sitting   Grooming: Wash/dry hands;Wash/dry face;Supervision/safety;Standing   Upper Body Bathing: Set up;Sitting   Lower Body Bathing: Minimal assistance;Sit to/from stand   Upper Body Dressing : Supervision/safety;Sitting   Lower Body Dressing: Moderate assistance;Sit to/from stand   Toilet Transfer: Supervision/safety;RW;BSC;Ambulation   Toileting- Water quality scientist and Hygiene: Supervision/safety;Sit to/from stand       Functional mobility during ADLs: Supervision/safety;Rolling walker General ADL Comments: Pt with decreased ability to reach either foot for dressing tasks.  Educated on AE for LB selfcare and let pt use the sockaide with min assist on the left and supervision on the right.  Pt plans to purchase for use at home.  He already has a 3:1 for use at home and will likely sponge bahte until he can step over into the tub.  He states that his mother may have a tub bench as well and will look into using it if she does.  Discussed need for a walker bag for home as well.       Vision Vision Assessment?: No apparent visual deficits   Perception Perception Perception Tested?: No   Praxis Praxis Praxis tested?: Not tested    Pertinent Vitals/Pain Pain Assessment: 0-10 Pain Score: 8  Faces Pain Scale: Hurts whole lot  Pain Location: left knee Pain Descriptors / Indicators: Aching;Grimacing;Sore Pain Intervention(s): Limited activity within patient's tolerance;Repositioned     Hand Dominance Right   Extremity/Trunk Assessment Upper Extremity Assessment Upper Extremity Assessment: Overall WFL for tasks assessed       Cervical / Trunk Assessment Cervical / Trunk  Assessment: Normal   Communication Communication Communication: No difficulties   Cognition Arousal/Alertness: Awake/alert Behavior During Therapy: WFL for tasks assessed/performed Overall Cognitive Status: Within Functional Limits for tasks assessed                        Exercises Exercises: Total Joint          Home Living Family/patient expects to be discharged to:: Private residence Living Arrangements: Other (Comment) (Pt's father) Available Help at Discharge: Family Type of Home: Mobile home Home Access: Stairs to enter Entrance Stairs-Number of Steps: 3 Entrance Stairs-Rails: Right (pt does not anticipate problems getting up steps) Home Layout: One level     Bathroom Shower/Tub: Tub/shower unit Shower/tub characteristics: Architectural technologist: Standard     Home Equipment: Environmental consultant - 2 wheels;Cane - quad;Bedside commode          Prior Functioning/Environment Level of Independence: Independent                      OT Goals(Current goals can be found in the care plan section) Acute Rehab OT Goals Patient Stated Goal: decr knee pain  OT Frequency:                End of Session Equipment Utilized During Treatment: Rolling walker Nurse Communication: Mobility status  Activity Tolerance: Patient tolerated treatment well Patient left: in chair;with call bell/phone within reach   Time: 1331-1423 OT Time Calculation (min): 52 min Charges:  OT General Charges $OT Visit: 1 Procedure OT Evaluation $Initial OT Evaluation Tier I: 1 Procedure OT Treatments $Self Care/Home Management : 38-52 mins  Richard [name] OTR/L 12/07/2014, 2:34 PM

## 2014-12-08 LAB — CBC
HCT: 36.8 % — ABNORMAL LOW (ref 39.0–52.0)
Hemoglobin: 11.8 g/dL — ABNORMAL LOW (ref 13.0–17.0)
MCH: 28.6 pg (ref 26.0–34.0)
MCHC: 32.1 g/dL (ref 30.0–36.0)
MCV: 89.1 fL (ref 78.0–100.0)
Platelets: 167 10*3/uL (ref 150–400)
RBC: 4.13 MIL/uL — ABNORMAL LOW (ref 4.22–5.81)
RDW: 13.5 % (ref 11.5–15.5)
WBC: 11.8 10*3/uL — ABNORMAL HIGH (ref 4.0–10.5)

## 2014-12-08 NOTE — Progress Notes (Signed)
Physical Therapy Progress Note  Pt pleasant and willing to work again. Improved gait pattern, and increased knee flexion with heel slides. Educated for bed mobility and heel roll end of session.    12/08/14 1100  PT Visit Information  Last PT Received On 12/08/14  Assistance Needed +1  History of Present Illness Admitted for LTKA;  has a past medical history of Obesity; Hypertension (08/17/2011); Chest pain (08/16/2011); LVH (left ventricular hypertrophy) (08/17/2011);  has past surgical history that includes  orthopedic surgery (Left); left heart catheterization with coronary angiogram (N/A, 01/12/2014); Partial knee arthroplasty (Right, 02/25/2014); and Joint replacement.  PT Time Calculation  PT Start Time (ACUTE ONLY) 1045  PT Stop Time (ACUTE ONLY) 1120  PT Time Calculation (min) (ACUTE ONLY) 35 min  Precautions  Precautions Knee  Restrictions  LLE Weight Bearing WBAT  Pain Assessment  Pain Assessment 0-10  Pain Score 9  Pain Location left knee  Pain Descriptors / Indicators Aching  Pain Intervention(s) Repositioned;Limited activity within patient's tolerance;Monitored during session;Ice applied  Cognition  Arousal/Alertness Awake/alert  Behavior During Therapy WFL for tasks assessed/performed  Overall Cognitive Status Within Functional Limits for tasks assessed  Bed Mobility  Overal bed mobility Needs Assistance  Bed Mobility Supine to Sit;Sit to Supine  Supine to sit Supervision  Sit to supine Min assist;Supervision  General bed mobility comments bed flat, no rail with cues for sequence and assist to bring legs onto surface first trial and pt able to complete without assist to supine second trial  Transfers  Overall transfer level Needs assistance  Transfers Sit to/from Stand  Sit to Stand Supervision  General transfer comment cues for safety and hand placement  Ambulation/Gait  Ambulation/Gait assistance Supervision  Ambulation Distance (Feet) 200 Feet  Assistive device  Rolling walker (2 wheeled)  Gait Pattern/deviations Step-to pattern  General Gait Details cues for posture and sequence with RW elevated for pt height, no buckling  Gait velocity interpretation Below normal speed for age/gender  Total Joint Exercises  Heel Slides AAROM;Supine;Left;10 reps  Straight Leg Raises Left;Supine;15 reps;AROM  Long Arc Quad AAROM;Seated;Left;15 reps  Marching in Standing AAROM;Seated;Left;15 reps  PT - End of Session  Equipment Utilized During Treatment Gait belt  Activity Tolerance Patient tolerated treatment well  Patient left in bed;with call bell/phone within reach;with family/visitor present (with call bell and family present)  Nurse Communication Mobility status  PT - Assessment/Plan  PT Plan Current plan remains appropriate  Follow Up Recommendations Home health PT;Supervision for mobility/OOB  PT Goal Progression  Progress towards PT goals Progressing toward goals  PT General Charges  $$ ACUTE PT VISIT 1 Procedure  PT Treatments  $Gait Training 8-22 mins  $Therapeutic Exercise 8-22 mins  [address] [place]

## 2014-12-08 NOTE — Progress Notes (Signed)
Physical Therapy Treatment Patient Details Name: Richard [name] MRN: [id number] DOB: 1960-06-19 Today's Date: 12/08/2014    History of Present Illness Admitted for LTKA;  has a past medical history of Obesity; Hypertension (08/17/2011); Chest pain (08/16/2011); LVH (left ventricular hypertrophy) (08/17/2011);  has past surgical history that includes  orthopedic surgery (Left); left heart catheterization with coronary angiogram (N/A, 01/12/2014); Partial knee arthroplasty (Right, 02/25/2014); and Joint replacement.    PT Comments    Pt progressing with gait distance and knee stability in stance. He continues to have great difficulty with knee flexion and bed mobility. Will continue to focus on progressing these areas as well as strength. Pt encouraged to perform HEp throughout the day.  Follow Up Recommendations  Home health PT;Supervision for mobility/OOB     Equipment Recommendations       Recommendations for Other Services       Precautions / Restrictions Precautions Precautions: Knee Knee Immobilizer - Left:  (KI  D/c) Restrictions LLE Weight Bearing: Weight bearing as tolerated    Mobility  Bed Mobility Overal bed mobility: Needs Assistance Bed Mobility: Supine to Sit     Supine to sit: Mod assist     General bed mobility comments: HOB 20 degrees with heavy reliance on rails, increased time and momentum with assist to control legs to EOB, elevate trunk  Transfers Overall transfer level: Needs assistance   Transfers: Sit to/from Stand Sit to Stand: Supervision         General transfer comment: cues for safety and hand placement  Ambulation/Gait Ambulation/Gait assistance: Supervision Ambulation Distance (Feet): 300 Feet Assistive device: Rolling walker (2 wheeled) Gait Pattern/deviations: Step-to pattern;Trunk flexed;Decreased stance time - left   Gait velocity interpretation: Below normal speed for age/gender General Gait Details: cues for posture and  sequence with RW elevated for pt height, no buckling   Stairs Stairs: Yes Stairs assistance: Min guard Stair Management: Step to pattern;Forwards;With crutches Number of Stairs: 2 General stair comments: cues for sequence and safety  Wheelchair Mobility    Modified Rankin (Stroke Patients Only)       Balance                                    Cognition Arousal/Alertness: Awake/alert Behavior During Therapy: WFL for tasks assessed/performed Overall Cognitive Status: Within Functional Limits for tasks assessed                      Exercises Total Joint Exercises Hip ABduction/ADduction: AAROM;Seated;Both;10 reps Straight Leg Raises: AAROM;Seated;Left;10 reps Long Arc Quad: AAROM;Seated;Left;10 reps Goniometric ROM: 15-30 Marching in Standing: AAROM;Seated;Left;10 reps    General Comments        Pertinent Vitals/Pain Pain Assessment: 0-10 Pain Score: 8  Pain Location: left knee Pain Descriptors / Indicators: Aching Pain Intervention(s): Premedicated before session;Repositioned;Ice applied;Limited activity within patient's tolerance    Home Living                      Prior Function            PT Goals (current goals can now be found in the care plan section) Progress towards PT goals: Progressing toward goals    Frequency       PT Plan Current plan remains appropriate    Co-evaluation             End of Session Equipment Utilized During Treatment:  Gait belt Activity Tolerance: Patient tolerated treatment well Patient left: in chair;with call bell/phone within reach     Time: 0800-0841 PT Time Calculation (min) (ACUTE ONLY): 41 min  Charges:  $Gait Training: 23-37 mins $Therapeutic Exercise: 8-22 mins                    G Codes:      Melford Aase 12/28/2014, 8:42 AM Elwyn Reach, [place]

## 2014-12-08 NOTE — Progress Notes (Signed)
Pt stated he would place himself on cpap when he was ready. RT informed pt to call for RT if he needed any assistance during the night

## 2014-12-08 NOTE — Progress Notes (Signed)
Patient ID: [name], male   DOB: 05/19/1960, 54 y.o.   MRN: [id number]     Subjective:  Patient reports pain as mild.  Patient in bed and in no acute distress.  Requesting to work with PT another day and to go home tomorrow  Objective:   VITALS:   Filed Vitals:   12/07/14 1300 12/07/14 1427 12/07/14 2118 12/08/14 0540  BP: 156/83  141/76 150/67  Pulse: 72 75 85 88  Temp:   99.9 F (37.7 C) 97.8 F (36.6 C)  TempSrc:   Oral Oral  Resp: 20  18 18   Height:      Weight:      SpO2: 95% 90% 95% 97%    ABD soft Sensation intact distally Dorsiflexion/Plantar flexion intact Incision: dressing C/D/I and no drainage Dressing changed Wound good  Lab Results  Component Value Date   WBC 9.5 12/07/2014   HGB 13.1 12/07/2014   HCT 40.5 12/07/2014   MCV 90.4 12/07/2014   PLT 169 12/07/2014   BMET    Component Value Date/Time   NA 138 12/07/2014 0518   K 4.2 12/07/2014 0518   CL 104 12/07/2014 0518   CO2 28 12/07/2014 0518   GLUCOSE 117* 12/07/2014 0518   BUN 21* 12/07/2014 0518   CREATININE 1.29* 12/07/2014 0518   CALCIUM 8.2* 12/07/2014 0518   GFRNONAA >60 12/07/2014 0518   GFRAA >60 12/07/2014 0518     Assessment/Plan: 2 Days Post-Op   Principal Problem:   Primary localized osteoarthritis of left knee Active Problems:   Morbid obesity ([place])   Knee osteoarthritis   Advance diet Up with therapy Plan for discharge tomorrow WBAT Dry dressing PRN    Richard [name] 12/08/2014, 7:13 AM   Marchia Bond, MD Cell [phone]

## 2014-12-08 NOTE — Care Management Note (Addendum)
Case Management Note  Patient Details  Name: Richard [name] MRN: [id number] Date of Birth: 09-09-1960  Subjective/Objective:          S/p left total knee arthroplasty          Action/Plan: Contacted [name]) on 12/06/15 and requested HHPT be set up as soon as possible, faxed information requested. Checked status on 12/07/14 and today but HHPT not set up yet.Will continue to monitor their progress. Spoke with patient 12/07/14, he has rolling walker and 3N1 and will have family available to assist after discharge. Updated patient on 12/07/15 about status of HHPT. Will continue to follow.  12/08/14 Contacted by [name], they have not been able to find a HHPT provider. Gave them the names of several agencies in area that they stated they will contact. Contacted [name] at 4:05pm, several hrs after previous call, they have still not been able to set up HHPT. They stated they will contact me and the patient once they have set up service. Will follow up with them 12/09/14 am. Have updated patient.   12/09/14 Call [name], spoke with Diona Fanti, a supervisor at [facility], they set patient up with [facility] for HHPT. Earliest they can start service will be 12/12/14. Updated patient, he will do home exercises over weekend. Contacted [facility], spoke with Maudie Mercury and verified HHPT set up and faxed demographics, HHPT order, H and P, op note and PT eval to [phone] and received confirmation.  Expected Discharge Date:                  Expected Discharge Plan:  [place]  In-House Referral:  NA  Discharge planning Services  CM Consult  Post Acute Care Choice:  Home Health Choice offered to:     DME Arranged:    DME Agency:     HH Arranged:  PT HH Agency:  Other - See comment  Status of Service:  In process, will continue to follow  Medicare Important Message Given:    Date Medicare IM Given:    Medicare IM give by:    Date Additional Medicare IM  Given:    Additional Medicare Important Message give by:     If discussed at [place] of Stay Meetings, dates discussed:    Additional Comments:  Nila Nephew, RN 12/08/2014, 10:38 AM

## 2014-12-09 LAB — CBC
HCT: 36.1 % — ABNORMAL LOW (ref 39.0–52.0)
Hemoglobin: 11.5 g/dL — ABNORMAL LOW (ref 13.0–17.0)
MCH: 28.8 pg (ref 26.0–34.0)
MCHC: 31.9 g/dL (ref 30.0–36.0)
MCV: 90.5 fL (ref 78.0–100.0)
Platelets: 168 10*3/uL (ref 150–400)
RBC: 3.99 MIL/uL — ABNORMAL LOW (ref 4.22–5.81)
RDW: 13.6 % (ref 11.5–15.5)
WBC: 10.6 10*3/uL — ABNORMAL HIGH (ref 4.0–10.5)

## 2014-12-09 NOTE — Progress Notes (Signed)
Patient ID: Richard [name], male   DOB: 09/02/1960, 54 y.o.   MRN: [id number]     Subjective:  Patient reports pain as mild.  Patient doing well and ready to go home.  Denies any CP or SOB  Objective:   VITALS:   Filed Vitals:   12/08/14 0540 12/08/14 1238 12/08/14 2050 12/09/14 0612  BP: 150/67 142/71 131/77 154/81  Pulse: 88 70 78 68  Temp: 97.8 F (36.6 C)  98.4 F (36.9 C) 97.5 F (36.4 C)  TempSrc: Oral  Oral Oral  Resp: 18 18 18 18   Height:      Weight:      SpO2: 97% 93% 96% 95%    ABD soft Sensation intact distally Dorsiflexion/Plantar flexion intact Incision: dressing C/D/I and scant drainage Good knee foot and ankle motion  Lab Results  Component Value Date   WBC 10.6* 12/09/2014   HGB 11.5* 12/09/2014   HCT 36.1* 12/09/2014   MCV 90.5 12/09/2014   PLT 168 12/09/2014   BMET    Component Value Date/Time   NA 138 12/07/2014 0518   K 4.2 12/07/2014 0518   CL 104 12/07/2014 0518   CO2 28 12/07/2014 0518   GLUCOSE 117* 12/07/2014 0518   BUN 21* 12/07/2014 0518   CREATININE 1.29* 12/07/2014 0518   CALCIUM 8.2* 12/07/2014 0518   GFRNONAA >60 12/07/2014 0518   GFRAA >60 12/07/2014 0518     Assessment/Plan: 3 Days Post-Op   Principal Problem:   Primary localized osteoarthritis of left knee Active Problems:   Morbid obesity ([place])   Knee osteoarthritis   Advance diet Up with therapy Discharge home with home health WBAT Dry dressing PRN   [name] 12/09/2014, 7:00 AM   Marchia Bond, MD Cell [phone]

## 2014-12-09 NOTE — Progress Notes (Signed)
Physical Therapy Treatment Patient Details Name: Richard [name] MRN: [id number] DOB: 1960/07/15 Today's Date: 12/09/2014    History of Present Illness Admitted for LTKA;  has a past medical history of Obesity; Hypertension (08/17/2011); Chest pain (08/16/2011); LVH (left ventricular hypertrophy) (08/17/2011);  has past surgical history that includes  orthopedic surgery (Left); left heart catheterization with coronary angiogram (N/A, 01/12/2014); Partial knee arthroplasty (Right, 02/25/2014); and Joint replacement.    PT Comments    Patient reported decreased pain today and is progressing toward goals as evident by ambulation of 339ft with supervision and increased knee flexion. Overall mobility of supervision/min guard. Current d/c plan of home with home health appropriate. Patient is compliant and eager to continue working with PT for increased mobility.   Follow Up Recommendations  Home health PT;Supervision for mobility/OOB     Equipment Recommendations       Recommendations for Other Services       Precautions / Restrictions Precautions Precautions: Knee Restrictions Weight Bearing Restrictions: Yes LLE Weight Bearing: Weight bearing as tolerated    Mobility  Bed Mobility Overal bed mobility: Needs Assistance Bed Mobility: Supine to Sit;Sit to Supine     Supine to sit: Supervision;Min guard     General bed mobility comments: bed flat with verbal cues for sequencing; guarding of Rt knee when coming into sitting for safety   Transfers Overall transfer level: Needs assistance Equipment used: Rolling walker (2 wheeled) Transfers: Sit to/from Stand Sit to Stand: Supervision         General transfer comment: cues for using RW througout turning for increased safety and hand placement  Ambulation/Gait Ambulation/Gait assistance: Supervision Ambulation Distance (Feet): 300 Feet (162ft X 2) Assistive device: Rolling walker (2 wheeled) Gait Pattern/deviations:  Step-through pattern;Decreased step length - right;Antalgic     General Gait Details: cues for sequencing of gait with RW for improved gait pattern; increased stride length and cadence after initial 150 ft; heavy weight bearing through bilateral UE when weight shifting Lt   Stairs Stairs: Yes Stairs assistance: Min guard (for safety) Stair Management: One rail Right Number of Stairs: 2 General stair comments: cues for sequence  Wheelchair Mobility    Modified Rankin (Stroke Patients Only)       Balance Overall balance assessment: Needs assistance Sitting-balance support: No upper extremity supported Sitting balance-Leahy Scale: Good     Standing balance support: Bilateral upper extremity supported;During functional activity Standing balance-Leahy Scale: Fair                      Cognition Arousal/Alertness: Awake/alert Behavior During Therapy: WFL for tasks assessed/performed Overall Cognitive Status: Within Functional Limits for tasks assessed                      Exercises Total Joint Exercises Heel Slides: AAROM;Left;10 reps;Seated Straight Leg Raises: Left;Supine;AROM;10 reps Long Arc Quad: AAROM;Seated;Left;15 reps Knee Flexion: AROM;AAROM;Left;10 reps;Seated    General Comments        Pertinent Vitals/Pain Pain Assessment: 0-10 Pain Score: 6  (only with movement; pt reports no pain without movement) Pain Location: left knee Pain Descriptors / Indicators: Sore Pain Intervention(s): Limited activity within patient's tolerance;Monitored during session;Premedicated before session;Ice applied    Home Living                      Prior Function            PT Goals (current goals can now be found in  the care plan section) Acute Rehab PT Goals Patient Stated Goal: ready to go home Progress towards PT goals: Progressing toward goals    Frequency       PT Plan Current plan remains appropriate    Co-evaluation              End of Session Equipment Utilized During Treatment: Gait belt Activity Tolerance: Patient tolerated treatment well Patient left: with call bell/phone within reach;in chair;Other (comment) (with ice pack and Lt LE elevated in extension)     Time: 0915-1000 PT Time Calculation (min) (ACUTE ONLY): 45 min  Charges:  $Gait Training: 23-37 mins $Therapeutic Exercise: 8-22 mins                    G CodesDarliss Cheney, PTA [phone] 12/09/2014, 10:24 AM

## 2014-12-09 NOTE — Progress Notes (Signed)
Patient was discharged home accompanied by family members with no apparent distress noted. Medications and discharge instructions explained to the patient and his family. They verbalized understanding. He was discharged via wheelchair to his private vehicle via transporter

## 2014-12-09 NOTE — Discharge Summary (Signed)
Physician Discharge Summary  Patient ID: Richard [name] MRN: [id number] DOB/AGE: 53/27/1962 53 y.o.  Admit date: 12/06/2014 Discharge date: 12/09/2014  Admission Diagnoses:  Primary localized osteoarthritis of left knee  Discharge Diagnoses:  Principal Problem:   Primary localized osteoarthritis of left knee Active Problems:   Morbid obesity ([place])   Knee osteoarthritis   Past Medical History  Diagnosis Date  . Obesity   . Hypertension 08/17/2011  . Chest pain 08/16/2011    MI ruled out.  Marland Kitchen LVH (left ventricular hypertrophy) 08/17/2011    Per 2-D echocardiogram.  . GERD (gastroesophageal reflux disease) 08/17/2011  . Arthritis   . Urine incontinence   . Primary localized osteoarthritis of right knee 02/25/2014  . Dysrhythmia     TACHY  . OSA (obstructive sleep apnea)     cpap  . Primary localized osteoarthritis of left knee 12/06/2014    Surgeries: Procedure(s): TOTAL KNEE ARTHROPLASTY on 12/06/2014   Consultants (if any):    Discharged Condition: Improved  Hospital Course: Richard [name] is an 54 y.o. male who was admitted 12/06/2014 with a diagnosis of Primary localized osteoarthritis of left knee and went to the operating room on 12/06/2014 and underwent the above named procedures.    He was given perioperative antibiotics:  Anti-infectives    Start     Dose/Rate Route Frequency Ordered Stop   12/06/14 1400  ceFAZolin (ANCEF) IVPB 2 g/50 mL premix     2 g 100 mL/hr over 30 Minutes Intravenous Every 6 hours 12/06/14 1332 12/07/14 0050   12/06/14 0600  ceFAZolin (ANCEF) 3 g in dextrose 5 % 50 mL IVPB     3 g 160 mL/hr over 30 Minutes Intravenous On call to O.R. 12/05/14 1337 12/06/14 0734    .  He was given sequential compression devices, early ambulation, and xarelto for DVT prophylaxis.  He benefited maximally from the hospital stay and there were no complications.    Recent vital signs:  Filed Vitals:   12/09/14 0612  BP: 154/81  Pulse: 68  Temp:  97.5 F (36.4 C)  Resp: 18    Recent laboratory studies:  Lab Results  Component Value Date   HGB 11.5* 12/09/2014   HGB 11.8* 12/08/2014   HGB 13.1 12/07/2014   Lab Results  Component Value Date   WBC 10.6* 12/09/2014   PLT 168 12/09/2014   Lab Results  Component Value Date   INR 1.1* 01/11/2014   Lab Results  Component Value Date   NA 138 12/07/2014   K 4.2 12/07/2014   CL 104 12/07/2014   CO2 28 12/07/2014   BUN 21* 12/07/2014   CREATININE 1.29* 12/07/2014   GLUCOSE 117* 12/07/2014    Discharge Medications:     Medication List    STOP taking these medications        aspirin 325 MG tablet     aspirin 81 MG EC tablet     meloxicam 7.5 MG tablet  Commonly known as:  MOBIC      TAKE these medications        baclofen 10 MG tablet  Commonly known as:  LIORESAL  Take 1 tablet (10 mg total) by mouth 3 (three) times daily. As needed for muscle spasm     esomeprazole 40 MG capsule  Commonly known as:  NEXIUM  Take 40 mg by mouth daily as needed (reflux).     metoprolol succinate 25 MG 24 hr tablet  Commonly known as:  TOPROL-XL  Take  0.5 tablets (12.5 mg total) by mouth 2 (two) times daily.     ondansetron 4 MG tablet  Commonly known as:  ZOFRAN  Take 1 tablet (4 mg total) by mouth every 8 (eight) hours as needed for nausea or vomiting.     oxyCODONE-acetaminophen 10-325 MG tablet  Commonly known as:  PERCOCET  Take 1-2 tablets by mouth every 6 (six) hours as needed for pain. MAXIMUM TOTAL ACETAMINOPHEN DOSE IS 4000 MG PER DAY     rivaroxaban 10 MG Tabs tablet  Commonly known as:  XARELTO  Take 1 tablet (10 mg total) by mouth daily.     sennosides-docusate sodium 8.6-50 MG tablet  Commonly known as:  SENOKOT-S  Take 2 tablets by mouth daily.     traMADol 50 MG tablet  Commonly known as:  ULTRAM  Take 1 tablet (50 mg total) by mouth 2 (two) times daily.        Diagnostic Studies: Dg Knee Left Port  12/06/2014  CLINICAL DATA:  Status post  left knee replacement EXAM: PORTABLE LEFT KNEE - 1-2 VIEW COMPARISON:  None. FINDINGS: Left knee replacement is noted. No acute fracture or loosening is seen. Air remains in the surgical bed. No other soft tissue abnormality is seen. IMPRESSION: Status post left knee replacement Electronically Signed   By: Inez Catalina M.D.   On: 12/06/2014 12:39    Disposition: 01-Home or Self Care        Follow-up Information    Follow up with Johnny Bridge, MD. Schedule an appointment as soon as possible for a visit in 2 weeks.   Specialty:  Orthopedic Surgery   Contact information:   [place] [id number] [phone]        Signed: Johnny Bridge 12/09/2014, 7:21 AM

## 2014-12-22 ENCOUNTER — Encounter: Payer: Self-pay | Admitting: Internal Medicine

## 2014-12-22 ENCOUNTER — Ambulatory Visit (INDEPENDENT_AMBULATORY_CARE_PROVIDER_SITE_OTHER): Payer: Managed Care, Other (non HMO) | Admitting: Internal Medicine

## 2014-12-22 VITALS — BP 138/78 | HR 76 | Temp 97.9°F | Wt 314.0 lb

## 2014-12-22 DIAGNOSIS — Z96652 Presence of left artificial knee joint: Secondary | ICD-10-CM

## 2014-12-22 DIAGNOSIS — M1990 Unspecified osteoarthritis, unspecified site: Secondary | ICD-10-CM

## 2014-12-22 MED ORDER — MELOXICAM 15 MG PO TABS: 15.0000 mg | ORAL_TABLET | Freq: Every day | ORAL | Status: DC

## 2014-12-22 MED ORDER — TRAMADOL HCL 50 MG PO TABS: 50.0000 mg | ORAL_TABLET | Freq: Two times a day (BID) | ORAL | Status: DC

## 2014-12-22 NOTE — Patient Instructions (Signed)

## 2014-12-22 NOTE — Progress Notes (Signed)
Subjective:    Patient ID: Richard [name], male    DOB: August 02, 1960, 54 y.o.   MRN: [id number]  HPI  Pt presents to the clinic today to discuss his arthritic pain. He has arthritis in his lower back, elbows, hands and knees. He already had a right TKR and just had a left TKE 2 weeks ago. Prior to his TKR, he was taking Meloxicam daily and Tramadol for severe pain with good relief. He would like to know if he can get restarted on both of those medications. He will be on Xarelto for 1 more week for DVT prophylaxis s/p his left TKR.  Review of Systems  Past Medical History  Diagnosis Date  . Obesity   . Hypertension 08/17/2011  . Chest pain 08/16/2011    MI ruled out.  Marland Kitchen LVH (left ventricular hypertrophy) 08/17/2011    Per 2-D echocardiogram.  . GERD (gastroesophageal reflux disease) 08/17/2011  . Arthritis   . Urine incontinence   . Primary localized osteoarthritis of right knee 02/25/2014  . Dysrhythmia     TACHY  . OSA (obstructive sleep apnea)     cpap  . Primary localized osteoarthritis of left knee 12/06/2014    Current Outpatient Prescriptions  Medication Sig Dispense Refill  . baclofen (LIORESAL) 10 MG tablet Take 1 tablet (10 mg total) by mouth 3 (three) times daily. As needed for muscle spasm 50 tablet 0  . esomeprazole (NEXIUM) 40 MG capsule Take 40 mg by mouth daily as needed (reflux).     . metoprolol succinate (TOPROL-XL) 25 MG 24 hr tablet Take 0.5 tablets (12.5 mg total) by mouth 2 (two) times daily. 90 tablet 3  . ondansetron (ZOFRAN) 4 MG tablet Take 1 tablet (4 mg total) by mouth every 8 (eight) hours as needed for nausea or vomiting. 30 tablet 0  . oxyCODONE-acetaminophen (PERCOCET) 10-325 MG tablet Take 1-2 tablets by mouth every 6 (six) hours as needed for pain. MAXIMUM TOTAL ACETAMINOPHEN DOSE IS 4000 MG PER DAY 50 tablet 0  . rivaroxaban (XARELTO) 10 MG TABS tablet Take 1 tablet (10 mg total) by mouth daily. 21 tablet 0  . sennosides-docusate sodium (SENOKOT-S)  8.6-50 MG tablet Take 2 tablets by mouth daily. 30 tablet 1  . traMADol (ULTRAM) 50 MG tablet Take 1 tablet (50 mg total) by mouth 2 (two) times daily. (Patient not taking: Reported on 12/22/2014) 60 tablet 0   No current facility-administered medications for this visit.    Allergies  Allergen Reactions  . Penicillins Other (See Comments)    REACTION: UNKNOWN Received 3 Gms Ancef IV preop 2 with no obvious reaction.    Family History  Problem Relation Age of Onset  . Coronary artery disease Mother   . Hyperlipidemia Mother   . Hypertension Mother   . Arthritis Mother   . Coronary artery disease Father   . Heart disease Paternal Grandmother   . Alzheimer's disease Paternal Grandfather     Social History   Social History  . Marital Status: Married    Spouse Name: N/A  . Number of Children: N/A  . Years of Education: N/A   Occupational History  . tiler    Social History Main Topics  . Smoking status: Never Smoker   . Smokeless tobacco: Never Used  . Alcohol Use: Yes     Comment: occassional  . Drug Use: No  . Sexual Activity: Yes   Other Topics Concern  . Not on file   Social History  Narrative     Constitutional: Denies fever, malaise, fatigue, headache or abrupt weight changes.  Musculoskeletal: Pt reports joint pain. Denies  difficulty with gait, muscle pain.  Skin: Pt reports incision to left knee. Denies redness, rashes, lesions or ulcercations.    No other specific complaints in a complete review of systems (except as listed in HPI above).     Objective:   Physical Exam   BP 138/78 mmHg  Pulse 76  Temp(Src) 97.9 F (36.6 C) (Oral)  Wt 314 lb (142.429 kg)  SpO2 97% Wt Readings from Last 3 Encounters:  12/22/14 314 lb (142.429 kg)  12/06/14 318 lb (144.244 kg)  11/25/14 318 lb (144.244 kg)    General: Appears his stated age, obese in NAD. Skin: Healing incision noted over left knee, some dried blood but no drainage or redness. Cardiovascular:  Normal rate and rhythm. S1,S2 noted.  No murmur, rubs or gallops noted.  Pulmonary/Chest: Normal effort and positive vesicular breath sounds. No respiratory distress. No wheezes, rales or ronchi noted.  Musculoskeletal: Normal flexion and extension of the spine. Decreased rotation (? R/t body habitus) Joint enlargement noted of the hands bilaterally. Hand grip equal. He is walking with a cane. Neurological: Alert and oriented.  BMET    Component Value Date/Time   NA 138 12/07/2014 0518   K 4.2 12/07/2014 0518   CL 104 12/07/2014 0518   CO2 28 12/07/2014 0518   GLUCOSE 117* 12/07/2014 0518   BUN 21* 12/07/2014 0518   CREATININE 1.29* 12/07/2014 0518   CALCIUM 8.2* 12/07/2014 0518   GFRNONAA >60 12/07/2014 0518   GFRAA >60 12/07/2014 0518    Lipid Panel     Component Value Date/Time   CHOL 157 03/02/2013 1036   TRIG 59.0 03/02/2013 1036   HDL 33.50* 03/02/2013 1036   CHOLHDL 5 03/02/2013 1036   VLDL 11.8 03/02/2013 1036   LDLCALC 112* 03/02/2013 1036    CBC    Component Value Date/Time   WBC 10.6* 12/09/2014 0405   RBC 3.99* 12/09/2014 0405   HGB 11.5* 12/09/2014 0405   HCT 36.1* 12/09/2014 0405   PLT 168 12/09/2014 0405   MCV 90.5 12/09/2014 0405   MCH 28.8 12/09/2014 0405   MCHC 31.9 12/09/2014 0405   RDW 13.6 12/09/2014 0405   LYMPHSABS 2.1 01/11/2014 1053   MONOABS 0.7 01/11/2014 1053   EOSABS 0.3 01/11/2014 1053   BASOSABS 0.1 01/11/2014 1053    Hgb A1C Lab Results  Component Value Date   HGBA1C 6.0 03/02/2013        Assessment & Plan:   Arthritis of multiple joints:  Will restart Meloxicam, once he is off the Xarelto Advised him taking Meloxicam while on Xarelto will increase his bleeding risk RX provided for Tramadol for severe pain Discussed how weight loss would help take some of the pressure off his joints, he reports he is not able to exercise at this time  RTC in 6 months for your annual exam/followup

## 2014-12-22 NOTE — Progress Notes (Signed)
Pre visit review using our clinic review tool, if applicable. No additional management support is needed unless otherwise documented below in the visit note. 

## 2015-01-18 ENCOUNTER — Encounter (HOSPITAL_BASED_OUTPATIENT_CLINIC_OR_DEPARTMENT_OTHER): Payer: Self-pay | Admitting: *Deleted

## 2015-01-20 ENCOUNTER — Encounter (HOSPITAL_BASED_OUTPATIENT_CLINIC_OR_DEPARTMENT_OTHER): Payer: Self-pay | Admitting: Anesthesiology

## 2015-01-20 ENCOUNTER — Ambulatory Visit (HOSPITAL_BASED_OUTPATIENT_CLINIC_OR_DEPARTMENT_OTHER)
Admission: RE | Admit: 2015-01-20 | Discharge: 2015-01-20 | Disposition: A | Discharge status: Home / Self Care | Payer: Managed Care, Other (non HMO) | Source: Ambulatory Visit | Attending: Orthopedic Surgery | Admitting: Orthopedic Surgery

## 2015-01-20 ENCOUNTER — Encounter (HOSPITAL_BASED_OUTPATIENT_CLINIC_OR_DEPARTMENT_OTHER)
Admission: RE | Disposition: A | Discharge status: Home / Self Care | Payer: Self-pay | Source: Ambulatory Visit | Attending: Orthopedic Surgery

## 2015-01-20 ENCOUNTER — Ambulatory Visit (HOSPITAL_BASED_OUTPATIENT_CLINIC_OR_DEPARTMENT_OTHER): Payer: Managed Care, Other (non HMO) | Admitting: Anesthesiology

## 2015-01-20 DIAGNOSIS — M25662 Stiffness of left knee, not elsewhere classified: Secondary | ICD-10-CM | POA: Diagnosis present

## 2015-01-20 DIAGNOSIS — G4733 Obstructive sleep apnea (adult) (pediatric): Secondary | ICD-10-CM | POA: Diagnosis not present

## 2015-01-20 DIAGNOSIS — I10 Essential (primary) hypertension: Secondary | ICD-10-CM | POA: Diagnosis not present

## 2015-01-20 DIAGNOSIS — Z880 Allergy status to penicillin: Secondary | ICD-10-CM | POA: Insufficient documentation

## 2015-01-20 DIAGNOSIS — Z96652 Presence of left artificial knee joint: Secondary | ICD-10-CM | POA: Diagnosis not present

## 2015-01-20 DIAGNOSIS — M24662 Ankylosis, left knee: Secondary | ICD-10-CM | POA: Insufficient documentation

## 2015-01-20 HISTORY — DX: Stiffness of left knee, not elsewhere classified: M25.662

## 2015-01-20 HISTORY — PX: KNEE CLOSED REDUCTION: SHX995

## 2015-01-20 SURGERY — MANIPULATION, KNEE, CLOSED
Anesthesia: Regional | Site: Knee | Laterality: Left

## 2015-01-20 MED ORDER — FENTANYL CITRATE (PF) 100 MCG/2ML IJ SOLN
INTRAMUSCULAR | Status: AC
  Filled 2015-01-20: qty 2

## 2015-01-20 MED ORDER — LIDOCAINE HCL (CARDIAC) 20 MG/ML IV SOLN
INTRAVENOUS | Status: AC
  Filled 2015-01-20: qty 5

## 2015-01-20 MED ORDER — LACTATED RINGERS IV SOLN
INTRAVENOUS | Status: DC
  Administered 2015-01-20 (×3): via INTRAVENOUS

## 2015-01-20 MED ORDER — PROPOFOL 10 MG/ML IV BOLUS
INTRAVENOUS | Status: AC
  Filled 2015-01-20: qty 20

## 2015-01-20 MED ORDER — BUPIVACAINE-EPINEPHRINE (PF) 0.5% -1:200000 IJ SOLN
INTRAMUSCULAR | Status: DC | PRN
  Administered 2015-01-20: 25 mL via PERINEURAL

## 2015-01-20 MED ORDER — LIDOCAINE HCL (CARDIAC) 20 MG/ML IV SOLN: INTRAVENOUS | Status: DC | PRN

## 2015-01-20 MED ORDER — OXYCODONE-ACETAMINOPHEN 10-325 MG PO TABS: 1.0000 | ORAL_TABLET | Freq: Four times a day (QID) | ORAL | Status: DC | PRN

## 2015-01-20 MED ORDER — PROPOFOL 10 MG/ML IV BOLUS
INTRAVENOUS | Status: DC | PRN
  Administered 2015-01-20: 300 mg via INTRAVENOUS
  Administered 2015-01-20: 50 mg via INTRAVENOUS
  Administered 2015-01-20: 100 mg via INTRAVENOUS

## 2015-01-20 MED ORDER — MIDAZOLAM HCL 2 MG/2ML IJ SOLN
INTRAMUSCULAR | Status: AC
  Filled 2015-01-20: qty 2

## 2015-01-20 MED ORDER — ONDANSETRON HCL 4 MG/2ML IJ SOLN
INTRAMUSCULAR | Status: AC
  Filled 2015-01-20: qty 2

## 2015-01-20 MED ORDER — OXYCODONE HCL 5 MG PO TABS
ORAL_TABLET | ORAL | Status: AC
  Filled 2015-01-20: qty 2

## 2015-01-20 MED ORDER — MIDAZOLAM HCL 2 MG/2ML IJ SOLN
1.0000 mg | INTRAMUSCULAR | Status: DC | PRN
  Administered 2015-01-20: 2 mg via INTRAVENOUS

## 2015-01-20 MED ORDER — ONDANSETRON HCL 4 MG/2ML IJ SOLN: 4.0000 mg | Freq: Once | INTRAMUSCULAR | Status: DC | PRN

## 2015-01-20 MED ORDER — ONDANSETRON HCL 4 MG/2ML IJ SOLN
INTRAMUSCULAR | Status: DC | PRN
  Administered 2015-01-20: 4 mg via INTRAVENOUS

## 2015-01-20 MED ORDER — DEXAMETHASONE SODIUM PHOSPHATE 10 MG/ML IJ SOLN
INTRAMUSCULAR | Status: AC
  Filled 2015-01-20: qty 1

## 2015-01-20 MED ORDER — OXYCODONE HCL 5 MG/5ML PO SOLN: 5.0000 mg | Freq: Once | ORAL | Status: AC | PRN

## 2015-01-20 MED ORDER — DEXAMETHASONE SODIUM PHOSPHATE 4 MG/ML IJ SOLN
INTRAMUSCULAR | Status: DC | PRN
  Administered 2015-01-20: 10 mg via INTRAVENOUS

## 2015-01-20 MED ORDER — BUPIVACAINE HCL (PF) 0.25 % IJ SOLN
INTRAMUSCULAR | Status: AC
  Filled 2015-01-20: qty 30

## 2015-01-20 MED ORDER — OXYCODONE HCL 5 MG PO TABS
10.0000 mg | ORAL_TABLET | Freq: Once | ORAL | Status: AC | PRN
  Administered 2015-01-20: 10 mg via ORAL

## 2015-01-20 MED ORDER — BUPIVACAINE HCL (PF) 0.5 % IJ SOLN
INTRAMUSCULAR | Status: AC
  Filled 2015-01-20: qty 30

## 2015-01-20 MED ORDER — PROPOFOL 10 MG/ML IV BOLUS
INTRAVENOUS | Status: AC
  Filled 2015-01-20: qty 40

## 2015-01-20 MED ORDER — FENTANYL CITRATE (PF) 100 MCG/2ML IJ SOLN
25.0000 ug | INTRAMUSCULAR | Status: DC | PRN
  Administered 2015-01-20 (×2): 50 ug via INTRAVENOUS
  Administered 2015-01-20 (×2): 25 ug via INTRAVENOUS

## 2015-01-20 MED ORDER — FENTANYL CITRATE (PF) 100 MCG/2ML IJ SOLN
50.0000 ug | INTRAMUSCULAR | Status: DC | PRN
  Administered 2015-01-20: 50 ug via INTRAVENOUS

## 2015-01-20 MED ORDER — METHYLPREDNISOLONE ACETATE 40 MG/ML IJ SUSP
INTRAMUSCULAR | Status: AC
  Filled 2015-01-20: qty 1

## 2015-01-20 MED ORDER — GLYCOPYRROLATE 0.2 MG/ML IJ SOLN: 0.2000 mg | Freq: Once | INTRAMUSCULAR | Status: DC | PRN

## 2015-01-20 MED ORDER — OXYCODONE HCL 5 MG PO TABS: 10.0000 mg | ORAL_TABLET | Freq: Once | ORAL | Status: DC

## 2015-01-20 MED ORDER — SCOPOLAMINE 1 MG/3DAYS TD PT72: 1.0000 | MEDICATED_PATCH | Freq: Once | TRANSDERMAL | Status: DC | PRN

## 2015-01-20 MED ORDER — HYDROMORPHONE HCL 1 MG/ML IJ SOLN: 0.2500 mg | INTRAMUSCULAR | Status: DC | PRN

## 2015-01-20 SURGICAL SUPPLY — 8 items
IMMOBILIZER KNEE 24 THIGH 36 (MISCELLANEOUS) ×1 IMPLANT
IMMOBILIZER KNEE 24 UNIV (MISCELLANEOUS) ×2
KNEE WRAP E Z 3 GEL PACK (MISCELLANEOUS) ×2 IMPLANT
NDL SAFETY ECLIPSE 18X1.5 (NEEDLE) IMPLANT
NEEDLE HYPO 18GX1.5 SHARP (NEEDLE)
NEEDLE SPNL 22GX3.5 QUINCKE BK (NEEDLE) IMPLANT
PAD ALCOHOL SWAB (MISCELLANEOUS) IMPLANT
SYR 20CC LL (SYRINGE) IMPLANT

## 2015-01-20 NOTE — Anesthesia Postprocedure Evaluation (Signed)
Anesthesia Post Note  Patient: Richard [name]  Procedure(s) Performed: Procedure(s) (LRB): LEFT KNEE MANIPULATION (Left)  Patient location during evaluation: PACU Anesthesia Type: General Level of consciousness: awake and alert Pain management: pain level controlled Vital Signs Assessment: post-procedure vital signs reviewed and stable Respiratory status: spontaneous breathing, nonlabored ventilation and respiratory function stable Cardiovascular status: blood pressure returned to baseline and stable Postop Assessment: no signs of nausea or vomiting Anesthetic complications: no    Last Vitals:  Filed Vitals:   01/20/15 1600 01/20/15 1615  BP: 119/76 139/73  Pulse: 70 68  Temp:    Resp: 19 17    Last Pain:  Filed Vitals:   01/20/15 1623  PainSc: 6                  Zenaida Deed

## 2015-01-20 NOTE — Progress Notes (Signed)
Assisted Dr. Imogene Burn with left, ultrasound guided, femoral block. Side rails up, monitors on throughout procedure. See vital signs in flow sheet. Tolerated Procedure well.

## 2015-01-20 NOTE — Transfer of Care (Signed)
Immediate Anesthesia Transfer of Care Note  Patient: Richard [name]  Procedure(s) Performed: Procedure(s): LEFT KNEE MANIPULATION (Left)  Patient Location: PACU  Anesthesia Type:GA combined with regional for post-op pain  Level of Consciousness: awake, alert  and patient cooperative  Airway & Oxygen Therapy: Patient Spontanous Breathing and Patient connected to face mask oxygen  Post-op Assessment: Report given to RN, Post -op Vital signs reviewed and stable and Patient moving all extremities  Post vital signs: Reviewed and stable  Last Vitals:  Filed Vitals:   01/20/15 1538 01/20/15 1539  BP: 113/65   Pulse: 76 76  Temp:    Resp:  25    Complications: No apparent anesthesia complications

## 2015-01-20 NOTE — Op Note (Signed)
01/20/2015  3:32 PM  PATIENT:  Richard [name]    PRE-OPERATIVE DIAGNOSIS:  ARTHROFIBROSIS LEFT KNEE  POST-OPERATIVE DIAGNOSIS:  Same  PROCEDURE:  LEFT KNEE MANIPULATION  SURGEON:  Johnny Bridge, MD  ANESTHESIA:   General  PREOPERATIVE INDICATIONS:  Richard [name] is a  54 y.o. male with a diagnosis of ARTHROFIBROSIS LEFT KNEE who failed conservative measures and elected for surgical management.  He had a total knee replacement done 6 weeks ago and failed to regain motion.  The risks benefits and alternatives were discussed with the patient preoperatively including but not limited to the risks of infection, bleeding, nerve injury, cardiopulmonary complications, the need for revision surgery, among others, and the patient was willing to proceed.  OPERATIVE IMPLANTS: None  OPERATIVE FINDINGS: Significant arthrofibrosis with range of motion 10 to 60 preoperatively. Postoperatively was able to get him to full extension and bent to 120. He did have significant lysis of adhesions during manipulation.  OPERATIVE PROCEDURE: The patient was brought to the operating room and placed in the supine position. General anesthesia administered. The left lower extremity was manipulated after time out was performed. I got significant lysis of adhesions and significant improvement in motion. There is no clinical evidence for fracture. The patella tracked well. After complete manipulation in both extension and flexion was achieved the patient was awakened and returned the PACU in stable and satisfactory condition. There were no Locations and he tolerated the procedure well.

## 2015-01-20 NOTE — Anesthesia Procedure Notes (Addendum)
Anesthesia Regional Block:  Femoral nerve block  Pre-Anesthetic Checklist: ,, timeout performed, Correct Patient, Correct Site, Correct Laterality, Correct Procedure, Correct Position, site marked, Risks and benefits discussed,  Surgical consent,  Pre-op evaluation,  At surgeon's request and post-op pain management  Laterality: Left  Prep: chloraprep       Needles:   Needle Type: Echogenic Stimulator Needle     Needle Length: 5cm 5 cm Needle Gauge: 21 and 21 G    Additional Needles:  Procedures: ultrasound guided (picture in chart) Femoral nerve block Narrative:  Injection made incrementally with aspirations every 5 mL.  Performed by: Personally  Anesthesiologist: JUDD, [name]  Additional Notes: Discussed risks of femoral nerve block including failure, bleeding, infection, nerve damage.  Femoral nerve block does not usually prevent all pain. Specifically, it treats the anterior, but often not the posterior knee. Questions answered.  Patient consents to block.   Procedure Name: LMA Insertion Date/Time: 01/20/2015 3:19 PM Performed by: Baxter Flattery Pre-anesthesia Checklist: Patient identified, Emergency Drugs available, Suction available and Patient being monitored Patient Re-evaluated:Patient Re-evaluated prior to inductionOxygen Delivery Method: Circle System Utilized Preoxygenation: Pre-oxygenation with 100% oxygen Intubation Type: IV induction Ventilation: Mask ventilation without difficulty LMA: LMA inserted LMA Size: 5.0 Number of attempts: 1 Airway Equipment and Method: Bite block Placement Confirmation: positive ETCO2 and breath sounds checked- equal and bilateral Tube secured with: Tape Dental Injury: Teeth and Oropharynx as per pre-operative assessment

## 2015-01-20 NOTE — Anesthesia Preprocedure Evaluation (Signed)
Anesthesia Evaluation  Patient identified by MRN, date of birth, ID band Patient awake    Reviewed: Allergy & Precautions, NPO status , Patient's Chart, lab work & pertinent test results  Airway Mallampati: I  TM Distance: >3 FB Neck ROM: Full    Dental  (+) Teeth Intact, Dental Advisory Given   Pulmonary sleep apnea and Continuous Positive Airway Pressure Ventilation ,    breath sounds clear to auscultation       Cardiovascular hypertension, Pt. on medications + dysrhythmias  Rhythm:Regular Rate:Normal     Neuro/Psych    GI/Hepatic GERD  Medicated and Controlled,  Endo/Other  Morbid obesity  Renal/GU      Musculoskeletal  (+) Arthritis , Osteoarthritis,    Abdominal (+) + obese,   Peds  Hematology   Anesthesia Other Findings   Reproductive/Obstetrics                             Anesthesia Physical  Anesthesia Plan  ASA: III  Anesthesia Plan: General   Post-op Pain Management:    Induction: Intravenous  Airway Management Planned: LMA  Additional Equipment:   Intra-op Plan:   Post-operative Plan: Extubation in OR  Informed Consent: I have reviewed the patients History and Physical, chart, labs and discussed the procedure including the risks, benefits and alternatives for the proposed anesthesia with the patient or authorized representative who has indicated his/her understanding and acceptance.   Dental advisory given  Plan Discussed with: CRNA, Anesthesiologist and Surgeon  Anesthesia Plan Comments:         Anesthesia Quick Evaluation

## 2015-01-20 NOTE — H&P (Signed)
PREOPERATIVE H&P  Chief Complaint: ARTHROFIBROSIS LEFT KNEE  HPI: Richard [name] is a 54 y.o. male who presents for preoperative history and physical with a diagnosis of ARTHROFIBROSIS LEFT KNEE. Symptoms are rated as moderate to severe, and have been worsening.  This is significantly impairing activities of daily living.  He has elected for surgical management. He had a previous left total knee replacement performed 12/06/2014 and has failed to progress with physical therapy and is frustrated with persistent lack of motion. He had a contralateral right partial knee replacement but was able to recover from that although did take a fairly prolonged time.  Past Medical History  Diagnosis Date  . Obesity   . Hypertension 08/17/2011  . Chest pain 08/16/2011    MI ruled out.  Marland Kitchen LVH (left ventricular hypertrophy) 08/17/2011    Per 2-D echocardiogram.  . GERD (gastroesophageal reflux disease) 08/17/2011  . Arthritis   . Urine incontinence   . Primary localized osteoarthritis of right knee 02/25/2014  . Dysrhythmia     TACHY  . Primary localized osteoarthritis of left knee 12/06/2014  . OSA (obstructive sleep apnea)     uses CPAP nightly   Past Surgical History  Procedure Laterality Date  . Appendectomy    . Orthopedic surgery Left     arthroscopy x3  . Left heart catheterization with coronary angiogram N/A 01/12/2014    Procedure: LEFT HEART CATHETERIZATION WITH CORONARY ANGIOGRAM;  Surgeon: Jettie Booze, MD;  Location: [facility] CATH LAB;  Service: Cardiovascular;  Laterality: N/A;  . Partial knee arthroplasty Right 02/25/2014    Procedure: RIGHT KNEE ARTHROPLASTY CONDYLE AND PLATEAU MEDIAL COMPARTMENT ;  Surgeon: Johnny Bridge, MD;  Location: [place];  Service: Orthopedics;  Laterality: Right;  . Joint replacement    . Total knee arthroplasty  12/06/2014    Procedure: TOTAL KNEE ARTHROPLASTY;  Surgeon: Marchia Bond, MD;  Location: [place];  Service: Orthopedics;;    Social History   Social History  . Marital Status: Married    Spouse Name: N/A  . Number of Children: N/A  . Years of Education: N/A   Occupational History  . tiler    Social History Main Topics  . Smoking status: Never Smoker   . Smokeless tobacco: Never Used  . Alcohol Use: Yes     Comment: occassional  . Drug Use: No  . Sexual Activity: Yes   Other Topics Concern  . None   Social History Narrative   Family History  Problem Relation Age of Onset  . Coronary artery disease Mother   . Hyperlipidemia Mother   . Hypertension Mother   . Arthritis Mother   . Coronary artery disease Father   . Heart disease Paternal Grandmother   . Alzheimer's disease Paternal Grandfather    Allergies  Allergen Reactions  . Penicillins Other (See Comments)    REACTION: UNKNOWN Received 3 Gms Ancef IV preop 2 with no obvious reaction.   Prior to Admission medications   Medication Sig Start Date End Date Taking? Authorizing Provider  baclofen (LIORESAL) 10 MG tablet Take 1 tablet (10 mg total) by mouth 3 (three) times daily. As needed for muscle spasm 12/06/14  Yes Marchia Bond, MD  esomeprazole (NEXIUM) 40 MG capsule Take 40 mg by mouth daily as needed (reflux).    Yes Historical Provider, MD  meloxicam (MOBIC) 15 MG tablet Take 1 tablet (15 mg total) by mouth daily. 12/22/14  Yes Jearld Fenton, NP  metoprolol succinate (  TOPROL-XL) 25 MG 24 hr tablet Take 0.5 tablets (12.5 mg total) by mouth 2 (two) times daily. 10/18/14  Yes Jettie Booze, MD  oxyCODONE-acetaminophen (PERCOCET) 10-325 MG tablet Take 1-2 tablets by mouth every 6 (six) hours as needed for pain. MAXIMUM TOTAL ACETAMINOPHEN DOSE IS 4000 MG PER DAY 12/06/14  Yes Marchia Bond, MD  sennosides-docusate sodium (SENOKOT-S) 8.6-50 MG tablet Take 2 tablets by mouth daily. 12/06/14  Yes Marchia Bond, MD     Positive ROS: All other systems have been reviewed and were otherwise negative with the exception of those mentioned  in the HPI and as above.  Physical Exam: General: Alert, no acute distress Cardiovascular: No pedal edema Respiratory: No cyanosis, no use of accessory musculature GI: No organomegaly, abdomen is soft and non-tender Skin: No lesions in the area of chief complaint Neurologic: Sensation intact distally Psychiatric: Patient is competent for consent with normal mood and affect Lymphatic: No axillary or cervical lymphadenopathy  MUSCULOSKELETAL: Left knee has range of motion from 10 to 60 with well-healed surgical wounds and no effusion and no evidence for infection.  Assessment: ARTHROFIBROSIS LEFT KNEE, status post total knee replacement   Plan: Plan for Procedure(s): LEFT KNEE MANIPULATION  The risks benefits and alternatives were discussed with the patient including but not limited to the risks of nonoperative treatment, versus surgical intervention including infection, bleeding, nerve injury,  blood clots, cardiopulmonary complications, morbidity, mortality, among others, and they were willing to proceed. We also discussed the potential for persistent pain, stiffness, periprosthetic fracture, incomplete recovery.  Johnny Bridge, MD Cell [phone]   01/20/2015 11:13 AM

## 2015-01-20 NOTE — Discharge Instructions (Signed)
Okay to begin the motion as soon as possible and put weight as much as he can tolerate on the knee.  Regional Anesthesia Blocks  1. Numbness or the inability to move the "blocked" extremity may last from 3-48 hours after placement. The length of time depends on the medication injected and your individual response to the medication. If the numbness is not going away after 48 hours, call your surgeon.  2. The extremity that is blocked will need to be protected until the numbness is gone and the  Strength has returned. Because you cannot feel it, you will need to take extra care to avoid injury. Because it may be weak, you may have difficulty moving it or using it. You may not know what position it is in without looking at it while the block is in effect.  3. For blocks in the legs and feet, returning to weight bearing and walking needs to be done carefully. You will need to wait until the numbness is entirely gone and the strength has returned. You should be able to move your leg and foot normally before you try and bear weight or walk. You will need someone to be with you when you first try to ensure you do not fall and possibly risk injury.  4. Bruising and tenderness at the needle site are common side effects and will resolve in a few days.  5. Persistent numbness or new problems with movement should be communicated to the surgeon or the [facility] [phone]).  Post Anesthesia Home Care Instructions  Activity: Get plenty of rest for the remainder of the day. A responsible adult should stay with you for 24 hours following the procedure.  For the next 24 hours, DO NOT: -Drive a car -Paediatric nurse -Drink alcoholic beverages -Take any medication unless instructed by your physician -Make any legal decisions or sign important papers.  Meals: Start with liquid foods such as gelatin or soup. Progress to regular foods as tolerated. Avoid  greasy, spicy, heavy foods. If nausea and/or vomiting occur, drink only clear liquids until the nausea and/or vomiting subsides. Call your physician if vomiting continues.  Special Instructions/Symptoms: Your throat may feel dry or sore from the anesthesia or the breathing tube placed in your throat during surgery. If this causes discomfort, gargle with warm salt water. The discomfort should disappear within 24 hours.  If you had a scopolamine patch placed behind your ear for the management of post- operative nausea and/or vomiting:  1. The medication in the patch is effective for 72 hours, after which it should be removed.  Wrap patch in a tissue and discard in the trash. Wash hands thoroughly with soap and water. 2. You may remove the patch earlier than 72 hours if you experience unpleasant side effects which may include dry mouth, dizziness or visual disturbances. 3. Avoid touching the patch. Wash your hands with soap and water after contact with the patch.

## 2015-01-24 ENCOUNTER — Encounter (HOSPITAL_BASED_OUTPATIENT_CLINIC_OR_DEPARTMENT_OTHER): Payer: Self-pay | Admitting: Orthopedic Surgery

## 2015-01-24 NOTE — Addendum Note (Signed)
Addendum  created 01/24/15 1010 by Tawni Millers, CRNA   Modules edited: Charges VN

## 2015-03-03 ENCOUNTER — Ambulatory Visit (INDEPENDENT_AMBULATORY_CARE_PROVIDER_SITE_OTHER): Payer: Managed Care, Other (non HMO) | Admitting: Family Medicine

## 2015-03-03 ENCOUNTER — Encounter: Payer: Self-pay | Admitting: Family Medicine

## 2015-03-03 VITALS — BP 120/78 | HR 70 | Temp 98.3°F | Ht 69.0 in | Wt 304.2 lb

## 2015-03-03 DIAGNOSIS — R519 Headache, unspecified: Secondary | ICD-10-CM

## 2015-03-03 DIAGNOSIS — R0781 Pleurodynia: Secondary | ICD-10-CM | POA: Insufficient documentation

## 2015-03-03 DIAGNOSIS — R51 Headache: Secondary | ICD-10-CM | POA: Diagnosis not present

## 2015-03-03 MED ORDER — KETOROLAC TROMETHAMINE 60 MG/2ML IM SOLN
60.0000 mg | Freq: Once | INTRAMUSCULAR | Status: AC
  Administered 2015-03-03: 60 mg via INTRAMUSCULAR

## 2015-03-03 NOTE — Assessment & Plan Note (Signed)
Lung exam clear. No sign of bacterial infection. No MSK soreness to palpation suggesting against costochondritis.  Most likely viral pleurisy.. Treat symptomatically.  If not improving eval further.

## 2015-03-03 NOTE — Assessment & Plan Note (Addendum)
Possible migraine variant given pt history. No clear sign of bacterial infection.  possible viral infection given pleurisy symptoms. Treat symptomatically, can try flonase to decrease and sinus congestion. Given toradol IM for pain in office today.

## 2015-03-03 NOTE — Patient Instructions (Signed)
Given toradol today.  Start nasal saline spray 2-3 times daily as well asOTC flonase 2 sprays per nostril daily.  Call if fever or not improving as expected.   Have CPAP evaluated at sleep machine store to look at fit.

## 2015-03-03 NOTE — Progress Notes (Signed)
Pre visit review using our clinic review tool, if applicable. No additional management support is needed unless otherwise documented below in the visit note. 

## 2015-03-03 NOTE — Progress Notes (Signed)
   Subjective:    Patient ID: Richard [name], male    DOB: 1960-05-11, 55 y.o.   MRN: [id number]  HPI  55 year old male with morbid obesity presents with 3 days of headache behind eyes. No nasal congestion, no runny nose. No neuro change, no numbness  Sens to light and sound. No nausea. Uses CPAP for sleep apnea. No weigh changes. Wakes up tired in AM. Baseline SOB.   No fever, but he is sweating a lot.  No sneeze.  Has tried aleve, Advil percocet without relief.  Chest pain with deep breaths starting in last 3-4 days. No tenderness to palpation. No exertion chest pain. No increase SOB. No cough.  No flu like symptoms, but has been fatigued in last year.     Currently have issues with knee replacement. IN PT.  Social History /Family History/Past Medical History reviewed and updated if needed.  Hx of migrane  Nonsmoker   Review of Systems  Constitutional: Positive for fatigue. Negative for fever.  HENT: Negative for ear pain.   Eyes: Negative for pain and redness.  Respiratory: Negative for cough and shortness of breath.   Cardiovascular: Positive for chest pain. Negative for palpitations and leg swelling.  Gastrointestinal: Negative for abdominal pain.       Objective:   Physical Exam  Constitutional: He is oriented to person, place, and time. Vital signs are normal. He appears well-developed and well-nourished.  Morbidly obese  HENT:  Head: Normocephalic.  Right Ear: Hearing normal.  Left Ear: Hearing normal.  Nose: Nose normal.  Mouth/Throat: Oropharynx is clear and moist and mucous membranes are normal.  Neck: Trachea normal. Carotid bruit is not present. No thyroid mass and no thyromegaly present.  Cardiovascular: Normal rate, regular rhythm and normal pulses.  Exam reveals no gallop, no distant heart sounds and no friction rub.   No murmur heard. 2 plus peripheral edema in left leg where recent knee replacement was.  Pulmonary/Chest: Effort normal and  breath sounds normal. No respiratory distress. Chest wall is not dull to percussion. He exhibits no mass, no tenderness and no bony tenderness.  Neurological: He is alert and oriented to person, place, and time. He has normal strength. No cranial nerve deficit or sensory deficit. He exhibits normal muscle tone. He displays a negative Romberg sign. Gait abnormal.   Given knee issues using crutches  Skin: Skin is warm, dry and intact. No rash noted.  Psychiatric: He has a normal mood and affect. His speech is normal and behavior is normal. Thought content normal.          Assessment & Plan:

## 2015-03-08 ENCOUNTER — Encounter: Payer: Self-pay | Admitting: Internal Medicine

## 2015-03-08 ENCOUNTER — Ambulatory Visit (INDEPENDENT_AMBULATORY_CARE_PROVIDER_SITE_OTHER): Payer: Managed Care, Other (non HMO) | Admitting: Internal Medicine

## 2015-03-08 VITALS — BP 130/80 | HR 84 | Temp 99.4°F | Wt 302.0 lb

## 2015-03-08 DIAGNOSIS — R509 Fever, unspecified: Secondary | ICD-10-CM

## 2015-03-08 DIAGNOSIS — J101 Influenza due to other identified influenza virus with other respiratory manifestations: Secondary | ICD-10-CM | POA: Diagnosis not present

## 2015-03-08 LAB — POCT INFLUENZA A/B
Influenza A, POC: NEGATIVE
Influenza B, POC: POSITIVE — AB

## 2015-03-08 MED ORDER — HYDROCOD POLST-CPM POLST ER 10-8 MG/5ML PO SUER: 5.0000 mL | Freq: Two times a day (BID) | ORAL | Status: DC | PRN

## 2015-03-08 MED ORDER — OSELTAMIVIR PHOSPHATE 75 MG PO CAPS: 75.0000 mg | ORAL_CAPSULE | Freq: Two times a day (BID) | ORAL | Status: DC

## 2015-03-08 NOTE — Patient Instructions (Signed)

## 2015-03-08 NOTE — Progress Notes (Signed)
Subjective:    Patient ID: Richard [name], male    DOB: 10/25/1960, 55 y.o.   MRN: [id number]  HPI  Pt presents to the clinic today with c/o headache, sore throat, cough and diarrhea. This started 3 days ago. The headache is located in his temples. He describes the pain as pressure, worse with coughing. He was seen 3 days ago by Dr. Diona Browner for his headache. She diagnosed him with migraine varient, gave him Toradol IM which he reports provided no relief. He denies difficulty swallowing. The cough is productive of thick white mucous. He is mildly short of breath. He had 5 loose stools this morning. He denies nausea, vomiting or blood in his stool. He has run fever, had chills and body aches. He has not tried anything OTC. He does not get the flu shot.  Review of Systems      Past Medical History  Diagnosis Date  . Obesity   . Hypertension 08/17/2011  . Chest pain 08/16/2011    MI ruled out.  Marland Kitchen LVH (left ventricular hypertrophy) 08/17/2011    Per 2-D echocardiogram.  . GERD (gastroesophageal reflux disease) 08/17/2011  . Arthritis   . Urine incontinence   . Primary localized osteoarthritis of right knee 02/25/2014  . Dysrhythmia     TACHY  . Primary localized osteoarthritis of left knee 12/06/2014  . OSA (obstructive sleep apnea)     uses CPAP nightly  . Stiffness of left knee 01/20/2015    Current Outpatient Prescriptions  Medication Sig Dispense Refill  . baclofen (LIORESAL) 10 MG tablet Take 1 tablet (10 mg total) by mouth 3 (three) times daily. As needed for muscle spasm 50 tablet 0  . esomeprazole (NEXIUM) 40 MG capsule Take 40 mg by mouth daily as needed (reflux).     Marland Kitchen HYDROcodone-acetaminophen (NORCO/VICODIN) 5-325 MG tablet TAKE 1 TABLET BY MOUTH EVERY 6 TO 8 HOURS AS NEEDED FOR PAIN  0  . meloxicam (MOBIC) 15 MG tablet Take 1 tablet (15 mg total) by mouth daily. 30 tablet 2  . metoprolol succinate (TOPROL-XL) 25 MG 24 hr tablet Take 0.5 tablets (12.5 mg total) by mouth 2  (two) times daily. 90 tablet 3  . sennosides-docusate sodium (SENOKOT-S) 8.6-50 MG tablet Take 2 tablets by mouth daily. 30 tablet 1  . chlorpheniramine-HYDROcodone (TUSSIONEX PENNKINETIC ER) 10-8 MG/5ML SUER Take 5 mLs by mouth every 12 (twelve) hours as needed for cough. 140 mL 0  . oseltamivir (TAMIFLU) 75 MG capsule Take 1 capsule (75 mg total) by mouth 2 (two) times daily. 10 capsule 0   No current facility-administered medications for this visit.    Allergies  Allergen Reactions  . Penicillins Other (See Comments)    REACTION: UNKNOWN Received 3 Gms Ancef IV preop 2 with no obvious reaction.    Family History  Problem Relation Age of Onset  . Coronary artery disease Mother   . Hyperlipidemia Mother   . Hypertension Mother   . Arthritis Mother   . Coronary artery disease Father   . Heart disease Paternal Grandmother   . Alzheimer's disease Paternal Grandfather     Social History   Social History  . Marital Status: Married    Spouse Name: N/A  . Number of Children: N/A  . Years of Education: N/A   Occupational History  . tiler    Social History Main Topics  . Smoking status: Never Smoker   . Smokeless tobacco: Never Used  . Alcohol Use: Yes  Comment: occassional  . Drug Use: No  . Sexual Activity: Yes   Other Topics Concern  . Not on file   Social History Narrative     Constitutional: Pt reports fever, malaise, headache. Denies abrupt weight changes.  HEENT: Pt reports sore throat. Denies eye pain, eye redness, ear pain, ringing in the ears, wax buildup, runny nose, nasal congestion, bloody nose. Respiratory: Pt reports cough. Denies difficulty breathing, shortness of breath, or sputum production.   Cardiovascular: Denies chest pain, chest tightness, palpitations or swelling in the hands or feet.  Gastrointestinal: Pt reports diarrhea. Denies abdominal pain, bloating, constipation, or blood in the stool.   No other specific complaints in a complete  review of systems (except as listed in HPI above).  Objective:   Physical Exam  BP 130/80 mmHg  Pulse 84  Temp(Src) 99.4 F (37.4 C) (Oral)  Wt 302 lb (136.986 kg)  SpO2 97% Wt Readings from Last 3 Encounters:  03/08/15 302 lb (136.986 kg)  03/03/15 304 lb 4 oz (138.007 kg)  01/20/15 302 lb (136.986 kg)    General: Appears his stated age, ill appearing, in NAD. Skin: Clammy HEENT: Head: normal shape and size, no sinus tenderness noted; Throat/Mouth: Teeth present, mucosa pink and moist, no exudate, lesions or ulcerations noted.  Neck:  Cervical adenopathy noted bilaterally.  Cardiovascular: Normal rate and rhythm. S1,S2 noted.  No murmur, rubs or gallops noted. Pulmonary/Chest: Normal effort and diminshed vesicular breath sounds. No respiratory distress. No wheezes, rales or ronchi noted.  Abdomen: Soft and nontender. Normal bowel sounds.   BMET    Component Value Date/Time   NA 138 12/07/2014 0518   K 4.2 12/07/2014 0518   CL 104 12/07/2014 0518   CO2 28 12/07/2014 0518   GLUCOSE 117* 12/07/2014 0518   BUN 21* 12/07/2014 0518   CREATININE 1.29* 12/07/2014 0518   CALCIUM 8.2* 12/07/2014 0518   GFRNONAA >60 12/07/2014 0518   GFRAA >60 12/07/2014 0518    Lipid Panel     Component Value Date/Time   CHOL 157 03/02/2013 1036   TRIG 59.0 03/02/2013 1036   HDL 33.50* 03/02/2013 1036   CHOLHDL 5 03/02/2013 1036   VLDL 11.8 03/02/2013 1036   LDLCALC 112* 03/02/2013 1036    CBC    Component Value Date/Time   WBC 10.6* 12/09/2014 0405   RBC 3.99* 12/09/2014 0405   HGB 11.5* 12/09/2014 0405   HCT 36.1* 12/09/2014 0405   PLT 168 12/09/2014 0405   MCV 90.5 12/09/2014 0405   MCH 28.8 12/09/2014 0405   MCHC 31.9 12/09/2014 0405   RDW 13.6 12/09/2014 0405   LYMPHSABS 2.1 01/11/2014 1053   MONOABS 0.7 01/11/2014 1053   EOSABS 0.3 01/11/2014 1053   BASOSABS 0.1 01/11/2014 1053    Hgb A1C Lab Results  Component Value Date   HGBA1C 6.0 03/02/2013           Assessment & Plan:   Influenza B  Rapid flu: Positive for B, negative for A eRx for Tamiflu 75 mg BID x 5 days RX for Tussionex cough syrup Advised rest, fluids and Ibuprofen for fevers/body aches  Return precautions given

## 2015-03-08 NOTE — Addendum Note (Signed)
Addended by: Lurlean Nanny on: 03/08/2015 12:44 PM   Modules accepted: Orders

## 2015-03-08 NOTE — Progress Notes (Signed)
Pre visit review using our clinic review tool, if applicable. No additional management support is needed unless otherwise documented below in the visit note. 

## 2015-03-22 ENCOUNTER — Encounter: Payer: Self-pay | Admitting: Internal Medicine

## 2015-03-22 ENCOUNTER — Ambulatory Visit (INDEPENDENT_AMBULATORY_CARE_PROVIDER_SITE_OTHER): Payer: Managed Care, Other (non HMO) | Admitting: Internal Medicine

## 2015-03-22 VITALS — BP 134/84 | HR 114 | Temp 98.5°F | Wt 300.0 lb

## 2015-03-22 DIAGNOSIS — M129 Arthropathy, unspecified: Secondary | ICD-10-CM | POA: Diagnosis not present

## 2015-03-22 MED ORDER — DICLOFENAC SODIUM 75 MG PO TBEC: 75.0000 mg | DELAYED_RELEASE_TABLET | Freq: Two times a day (BID) | ORAL | Status: DC

## 2015-03-22 MED ORDER — TRAMADOL HCL 50 MG PO TABS: 50.0000 mg | ORAL_TABLET | Freq: Two times a day (BID) | ORAL | Status: DC | PRN

## 2015-03-22 NOTE — Progress Notes (Addendum)
Subjective:    Patient ID: Richard [name], male    DOB: [birth year], 55 y.o.   MRN: [id number]  HPI  Pt presents to the clinic today to discuss his arthritic pain. He has arthritis in his lower back, elbows, hands and knees. He already had a right TKR and just had a left TKE 23 months ago. He did have some complications with his left TKE and he is starting to have more pain in the left knee. He is taking Meloxicam but reports it is not as effective as it used to be. He occassionally takes Norco, but it doesn't seem to help either. He has also tried Aleve OTC without much relief. He has not had PT in 2 weeks because he had the flu, and this has really set him back. He reports his time is running out with PT. He follows up with Dr. Mardelle Matte next week.  Review of Systems  Past Medical History  Diagnosis Date  . Obesity   . Hypertension 08/17/2011  . Chest pain 08/16/2011    MI ruled out.  Marland Kitchen LVH (left ventricular hypertrophy) 08/17/2011    Per 2-D echocardiogram.  . GERD (gastroesophageal reflux disease) 08/17/2011  . Arthritis   . Urine incontinence   . Primary localized osteoarthritis of right knee 02/25/2014  . Dysrhythmia     TACHY  . Primary localized osteoarthritis of left knee 12/06/2014  . OSA (obstructive sleep apnea)     uses CPAP nightly  . Stiffness of left knee 01/20/2015    Current Outpatient Prescriptions  Medication Sig Dispense Refill  . baclofen (LIORESAL) 10 MG tablet Take 1 tablet (10 mg total) by mouth 3 (three) times daily. As needed for muscle spasm 50 tablet 0  . chlorpheniramine-HYDROcodone (TUSSIONEX PENNKINETIC ER) 10-8 MG/5ML SUER Take 5 mLs by mouth every 12 (twelve) hours as needed for cough. 140 mL 0  . esomeprazole (NEXIUM) 40 MG capsule Take 40 mg by mouth daily as needed (reflux).     Marland Kitchen HYDROcodone-acetaminophen (NORCO/VICODIN) 5-325 MG tablet TAKE 1 TABLET BY MOUTH EVERY 6 TO 8 HOURS AS NEEDED FOR PAIN  0  . meloxicam (MOBIC) 15 MG tablet Take 1 tablet  (15 mg total) by mouth daily. 30 tablet 2  . metoprolol succinate (TOPROL-XL) 25 MG 24 hr tablet Take 0.5 tablets (12.5 mg total) by mouth 2 (two) times daily. 90 tablet 3  . oseltamivir (TAMIFLU) 75 MG capsule Take 1 capsule (75 mg total) by mouth 2 (two) times daily. 10 capsule 0  . sennosides-docusate sodium (SENOKOT-S) 8.6-50 MG tablet Take 2 tablets by mouth daily. 30 tablet 1   No current facility-administered medications for this visit.    Allergies  Allergen Reactions  . Penicillins Other (See Comments)    REACTION: UNKNOWN Received 3 Gms Ancef IV preop 2 with no obvious reaction.    Family History  Problem Relation Age of Onset  . Coronary artery disease Mother   . Hyperlipidemia Mother   . Hypertension Mother   . Arthritis Mother   . Coronary artery disease Father   . Heart disease Paternal Grandmother   . Alzheimer's disease Paternal Grandfather     Social History   Social History  . Marital Status: Married    Spouse Name: N/A  . Number of Children: N/A  . Years of Education: N/A   Occupational History  . tiler    Social History Main Topics  . Smoking status: Never Smoker   . Smokeless tobacco: Never  Used  . Alcohol Use: Yes     Comment: occassional  . Drug Use: No  . Sexual Activity: Yes   Other Topics Concern  . Not on file   Social History Narrative     Constitutional: Denies fever, malaise, fatigue, headache or abrupt weight changes.  Musculoskeletal: Pt reports joint pain. Denies  difficulty with gait, muscle pain.  Skin: Denies redness, rashes, lesions or ulcercations.    No other specific complaints in a complete review of systems (except as listed in HPI above).     Objective:   Physical Exam   BP 134/84 mmHg  Pulse 114  Temp(Src) 98.5 F (36.9 C) (Oral)  Wt 300 lb (136.079 kg)  SpO2 96%  Wt Readings from Last 3 Encounters:  03/08/15 302 lb (136.986 kg)  03/03/15 304 lb 4 oz (138.007 kg)  01/20/15 302 lb (136.986 kg)     General: Appears his stated age, obese in NAD. Cardiovascular: Normal rate and rhythm. S1,S2 noted.  No murmur, rubs or gallops noted.  Pulmonary/Chest: Normal effort and positive vesicular breath sounds. No respiratory distress. No wheezes, rales or ronchi noted.  Musculoskeletal: Normal flexion and extension of the spine. Decreased rotation (? R/t body habitus) Joint enlargement noted of the hands bilaterally. Hand grip equal. Normal flexion, decreased extension of the left knees. Neurological: Alert and oriented.  BMET    Component Value Date/Time   NA 138 12/07/2014 0518   K 4.2 12/07/2014 0518   CL 104 12/07/2014 0518   CO2 28 12/07/2014 0518   GLUCOSE 117* 12/07/2014 0518   BUN 21* 12/07/2014 0518   CREATININE 1.29* 12/07/2014 0518   CALCIUM 8.2* 12/07/2014 0518   GFRNONAA >60 12/07/2014 0518   GFRAA >60 12/07/2014 0518    Lipid Panel     Component Value Date/Time   CHOL 157 03/02/2013 1036   TRIG 59.0 03/02/2013 1036   HDL 33.50* 03/02/2013 1036   CHOLHDL 5 03/02/2013 1036   VLDL 11.8 03/02/2013 1036   LDLCALC 112* 03/02/2013 1036    CBC    Component Value Date/Time   WBC 10.6* 12/09/2014 0405   RBC 3.99* 12/09/2014 0405   HGB 11.5* 12/09/2014 0405   HCT 36.1* 12/09/2014 0405   PLT 168 12/09/2014 0405   MCV 90.5 12/09/2014 0405   MCH 28.8 12/09/2014 0405   MCHC 31.9 12/09/2014 0405   RDW 13.6 12/09/2014 0405   LYMPHSABS 2.1 01/11/2014 1053   MONOABS 0.7 01/11/2014 1053   EOSABS 0.3 01/11/2014 1053   BASOSABS 0.1 01/11/2014 1053    Hgb A1C Lab Results  Component Value Date   HGBA1C 6.0 03/02/2013        Assessment & Plan:   Arthritis of multiple joints:  Discussed switching from Meloxicam to Voltaren, eRx sent to pharmacy No NSIADS while on Voltaren RX provided for Tramadol for severe pain Discussed how weight loss would help take some of the pressure off his joints, he reports he is not able to exercise at this time  RTC in 3 months for  your annual exam/followup

## 2015-03-22 NOTE — Progress Notes (Signed)
Pre visit review using our clinic review tool, if applicable. No additional management support is needed unless otherwise documented below in the visit note. 

## 2015-03-22 NOTE — Patient Instructions (Signed)

## 2015-03-27 ENCOUNTER — Emergency Department: Payer: Managed Care, Other (non HMO)

## 2015-03-27 ENCOUNTER — Inpatient Hospital Stay
Admit: 2015-03-27 | Discharge: 2015-03-27 | Disposition: A | Discharge status: Home / Self Care | Payer: Managed Care, Other (non HMO) | Attending: Internal Medicine | Admitting: Internal Medicine

## 2015-03-27 ENCOUNTER — Inpatient Hospital Stay
Admission: EM | Admit: 2015-03-27 | Discharge: 2015-04-03 | DRG: 163 | Disposition: A | Discharge status: Home / Self Care | Payer: Managed Care, Other (non HMO) | Attending: Internal Medicine | Admitting: Internal Medicine

## 2015-03-27 ENCOUNTER — Inpatient Hospital Stay: Payer: Managed Care, Other (non HMO)

## 2015-03-27 ENCOUNTER — Encounter: Payer: Self-pay | Admitting: Emergency Medicine

## 2015-03-27 ENCOUNTER — Telehealth: Payer: Self-pay | Admitting: Internal Medicine

## 2015-03-27 DIAGNOSIS — Z6841 Body Mass Index (BMI) 40.0 and over, adult: Secondary | ICD-10-CM | POA: Diagnosis not present

## 2015-03-27 DIAGNOSIS — I110 Hypertensive heart disease with heart failure: Secondary | ICD-10-CM | POA: Diagnosis present

## 2015-03-27 DIAGNOSIS — Z7901 Long term (current) use of anticoagulants: Secondary | ICD-10-CM | POA: Diagnosis not present

## 2015-03-27 DIAGNOSIS — I5032 Chronic diastolic (congestive) heart failure: Secondary | ICD-10-CM | POA: Diagnosis present

## 2015-03-27 DIAGNOSIS — I82453 Acute embolism and thrombosis of peroneal vein, bilateral: Secondary | ICD-10-CM

## 2015-03-27 DIAGNOSIS — J9690 Respiratory failure, unspecified, unspecified whether with hypoxia or hypercapnia: Secondary | ICD-10-CM | POA: Diagnosis not present

## 2015-03-27 DIAGNOSIS — I82459 Acute embolism and thrombosis of unspecified peroneal vein: Secondary | ICD-10-CM

## 2015-03-27 DIAGNOSIS — Z96652 Presence of left artificial knee joint: Secondary | ICD-10-CM | POA: Diagnosis present

## 2015-03-27 DIAGNOSIS — R609 Edema, unspecified: Secondary | ICD-10-CM

## 2015-03-27 DIAGNOSIS — R079 Chest pain, unspecified: Secondary | ICD-10-CM | POA: Diagnosis present

## 2015-03-27 DIAGNOSIS — M79672 Pain in left foot: Secondary | ICD-10-CM | POA: Diagnosis not present

## 2015-03-27 DIAGNOSIS — J9602 Acute respiratory failure with hypercapnia: Secondary | ICD-10-CM | POA: Diagnosis present

## 2015-03-27 DIAGNOSIS — I5189 Other ill-defined heart diseases: Secondary | ICD-10-CM | POA: Diagnosis present

## 2015-03-27 DIAGNOSIS — I4891 Unspecified atrial fibrillation: Secondary | ICD-10-CM | POA: Insufficient documentation

## 2015-03-27 DIAGNOSIS — G4733 Obstructive sleep apnea (adult) (pediatric): Secondary | ICD-10-CM | POA: Diagnosis present

## 2015-03-27 DIAGNOSIS — J9601 Acute respiratory failure with hypoxia: Secondary | ICD-10-CM | POA: Diagnosis present

## 2015-03-27 DIAGNOSIS — I82492 Acute embolism and thrombosis of other specified deep vein of left lower extremity: Secondary | ICD-10-CM | POA: Diagnosis present

## 2015-03-27 DIAGNOSIS — K219 Gastro-esophageal reflux disease without esophagitis: Secondary | ICD-10-CM | POA: Diagnosis present

## 2015-03-27 DIAGNOSIS — I480 Paroxysmal atrial fibrillation: Secondary | ICD-10-CM | POA: Diagnosis not present

## 2015-03-27 DIAGNOSIS — R0902 Hypoxemia: Secondary | ICD-10-CM | POA: Diagnosis not present

## 2015-03-27 DIAGNOSIS — Z7982 Long term (current) use of aspirin: Secondary | ICD-10-CM | POA: Diagnosis not present

## 2015-03-27 DIAGNOSIS — I82432 Acute embolism and thrombosis of left popliteal vein: Secondary | ICD-10-CM | POA: Diagnosis not present

## 2015-03-27 DIAGNOSIS — I2609 Other pulmonary embolism with acute cor pulmonale: Secondary | ICD-10-CM | POA: Diagnosis not present

## 2015-03-27 DIAGNOSIS — M1711 Unilateral primary osteoarthritis, right knee: Secondary | ICD-10-CM | POA: Diagnosis present

## 2015-03-27 DIAGNOSIS — I2699 Other pulmonary embolism without acute cor pulmonale: Secondary | ICD-10-CM | POA: Diagnosis present

## 2015-03-27 DIAGNOSIS — I519 Heart disease, unspecified: Secondary | ICD-10-CM | POA: Diagnosis not present

## 2015-03-27 HISTORY — DX: Chronic diastolic (congestive) heart failure: I50.32

## 2015-03-27 HISTORY — DX: Other pulmonary embolism without acute cor pulmonale: I26.99

## 2015-03-27 HISTORY — DX: Hypertensive heart disease without heart failure: I11.9

## 2015-03-27 HISTORY — DX: Acute embolism and thrombosis of unspecified deep veins of unspecified lower extremity: I82.409

## 2015-03-27 LAB — CBC
HCT: 42.7 % (ref 40.0–52.0)
Hemoglobin: 13.8 g/dL (ref 13.0–18.0)
MCH: 26.2 pg (ref 26.0–34.0)
MCHC: 32.3 g/dL (ref 32.0–36.0)
MCV: 81 fL (ref 80.0–100.0)
Platelets: 240 10*3/uL (ref 150–440)
RBC: 5.27 MIL/uL (ref 4.40–5.90)
RDW: 15.2 % — ABNORMAL HIGH (ref 11.5–14.5)
WBC: 9.9 10*3/uL (ref 3.8–10.6)

## 2015-03-27 LAB — HEPARIN LEVEL (UNFRACTIONATED): Heparin Unfractionated: <0.1 IU/mL — ABNORMAL LOW (ref 0.30–0.70)

## 2015-03-27 LAB — BASIC METABOLIC PANEL
Anion gap: 8 (ref 5–15)
BUN: 17 mg/dL (ref 6–20)
CO2: 25 mmol/L (ref 22–32)
Calcium: 9.1 mg/dL (ref 8.9–10.3)
Chloride: 107 mmol/L (ref 101–111)
Creatinine, Ser: 0.94 mg/dL (ref 0.61–1.24)
GFR calc Af Amer: >60 mL/min (ref >60)
GFR calc non Af Amer: >60 mL/min (ref >60)
Glucose, Bld: 86 mg/dL (ref 65–99)
Potassium: 4.3 mmol/L (ref 3.5–5.1)
Sodium: 140 mmol/L (ref 135–145)

## 2015-03-27 LAB — TROPONIN I: Troponin I: <0.03 ng/mL (ref <0.031)

## 2015-03-27 LAB — PROTIME-INR
INR: 1.13
Prothrombin Time: 14.7 seconds (ref 11.4–15.0)

## 2015-03-27 LAB — LIPASE, BLOOD: Lipase: 23 U/L (ref 11–51)

## 2015-03-27 MED ORDER — HEPARIN (PORCINE) IN NACL 100-0.45 UNIT/ML-% IJ SOLN
1700.0000 [IU]/h | Freq: Once | INTRAMUSCULAR | Status: DC
  Filled 2015-03-27: qty 250

## 2015-03-27 MED ORDER — HYDROCODONE-ACETAMINOPHEN 5-325 MG PO TABS
1.0000 | ORAL_TABLET | Freq: Four times a day (QID) | ORAL | Status: DC | PRN
  Administered 2015-03-27: 1 via ORAL
  Filled 2015-03-27: qty 1

## 2015-03-27 MED ORDER — HEPARIN (PORCINE) IN NACL 100-0.45 UNIT/ML-% IJ SOLN
2000.0000 [IU]/h | INTRAMUSCULAR | Status: DC
  Filled 2015-03-27: qty 250

## 2015-03-27 MED ORDER — ONDANSETRON HCL 4 MG/2ML IJ SOLN
INTRAMUSCULAR | Status: AC
  Administered 2015-03-27: 4 mg via INTRAVENOUS
  Filled 2015-03-27: qty 2

## 2015-03-27 MED ORDER — HEPARIN (PORCINE) IN NACL 100-0.45 UNIT/ML-% IJ SOLN
1700.0000 [IU]/h | Freq: Once | INTRAMUSCULAR | Status: AC
  Administered 2015-03-27: 1700 [IU]/h via INTRAVENOUS
  Filled 2015-03-27: qty 250

## 2015-03-27 MED ORDER — ASPIRIN EC 81 MG PO TBEC
81.0000 mg | DELAYED_RELEASE_TABLET | Freq: Every day | ORAL | Status: DC
  Administered 2015-03-27 – 2015-04-03 (×8): 81 mg via ORAL
  Filled 2015-03-27 (×8): qty 1

## 2015-03-27 MED ORDER — HEPARIN BOLUS VIA INFUSION
3000.0000 [IU] | Freq: Once | INTRAVENOUS | Status: AC
  Administered 2015-03-27: 3000 [IU] via INTRAVENOUS
  Filled 2015-03-27: qty 3000

## 2015-03-27 MED ORDER — METOPROLOL SUCCINATE ER 25 MG PO TB24
12.5000 mg | ORAL_TABLET | Freq: Two times a day (BID) | ORAL | Status: DC
  Administered 2015-03-27 – 2015-03-31 (×9): 12.5 mg via ORAL
  Filled 2015-03-27 (×5): qty 1
  Filled 2015-03-27: qty 0.5
  Filled 2015-03-27 (×4): qty 1

## 2015-03-27 MED ORDER — HYDROMORPHONE HCL 1 MG/ML IJ SOLN
INTRAMUSCULAR | Status: AC
  Administered 2015-03-27: 1 mg via INTRAVENOUS
  Filled 2015-03-27: qty 1

## 2015-03-27 MED ORDER — HEPARIN SODIUM (PORCINE) 5000 UNIT/ML IJ SOLN
4000.0000 [IU] | Freq: Once | INTRAMUSCULAR | Status: AC
  Administered 2015-03-27: 4000 [IU] via INTRAVENOUS

## 2015-03-27 MED ORDER — PANTOPRAZOLE SODIUM 40 MG PO TBEC
40.0000 mg | DELAYED_RELEASE_TABLET | Freq: Every day | ORAL | Status: DC
Start: 2015-03-27 — End: 2015-04-03
  Administered 2015-03-27 – 2015-04-03 (×8): 40 mg via ORAL
  Filled 2015-03-27 (×8): qty 1

## 2015-03-27 MED ORDER — FUROSEMIDE 40 MG PO TABS: 40.0000 mg | ORAL_TABLET | Freq: Every day | ORAL | Status: DC | PRN

## 2015-03-27 MED ORDER — HYDROMORPHONE HCL 1 MG/ML IJ SOLN
1.0000 mg | Freq: Once | INTRAMUSCULAR | Status: AC
  Administered 2015-03-27: 1 mg via INTRAVENOUS

## 2015-03-27 MED ORDER — HYDROMORPHONE 1 MG/ML IV SOLN
INTRAVENOUS | Status: DC
  Administered 2015-03-27 – 2015-03-28 (×2): 1 mg via INTRAVENOUS
  Administered 2015-03-29: 0.5 mg via INTRAVENOUS
  Administered 2015-03-29: 1.75 mg via INTRAVENOUS
  Administered 2015-03-29: 0.75 mg via INTRAVENOUS
  Administered 2015-03-29: 1.5 mg via INTRAVENOUS
  Administered 2015-03-29: 0.25 mg via INTRAVENOUS
  Administered 2015-03-30: 0.5 mg via INTRAVENOUS
  Administered 2015-03-30: 0.75 mg via INTRAVENOUS
  Filled 2015-03-27: qty 25

## 2015-03-27 MED ORDER — DIPHENHYDRAMINE HCL 50 MG/ML IJ SOLN: 12.5000 mg | Freq: Four times a day (QID) | INTRAMUSCULAR | Status: DC | PRN

## 2015-03-27 MED ORDER — BACLOFEN 10 MG PO TABS: 10.0000 mg | ORAL_TABLET | Freq: Three times a day (TID) | ORAL | Status: DC | PRN

## 2015-03-27 MED ORDER — DIPHENHYDRAMINE HCL 12.5 MG/5ML PO ELIX
12.5000 mg | ORAL_SOLUTION | Freq: Four times a day (QID) | ORAL | Status: DC | PRN
  Filled 2015-03-27: qty 5

## 2015-03-27 MED ORDER — HYDROMORPHONE HCL 1 MG/ML IJ SOLN
1.0000 mg | INTRAMUSCULAR | Status: DC | PRN
Start: 2015-03-27 — End: 2015-03-28
  Administered 2015-03-27: 1 mg via INTRAVENOUS
  Filled 2015-03-27 (×2): qty 1

## 2015-03-27 MED ORDER — NALOXONE HCL 0.4 MG/ML IJ SOLN
0.4000 mg | INTRAMUSCULAR | Status: DC | PRN
Start: 2015-03-27 — End: 2015-03-30

## 2015-03-27 MED ORDER — SODIUM CHLORIDE 0.9% FLUSH: 9.0000 mL | INTRAVENOUS | Status: DC | PRN

## 2015-03-27 MED ORDER — SENNOSIDES-DOCUSATE SODIUM 8.6-50 MG PO TABS: 1.0000 | ORAL_TABLET | Freq: Every day | ORAL | Status: DC

## 2015-03-27 MED ORDER — HYDROMORPHONE HCL 1 MG/ML IJ SOLN
1.0000 mg | Freq: Once | INTRAMUSCULAR | Status: AC
  Administered 2015-03-27: 1 mg via INTRAVENOUS
  Filled 2015-03-27: qty 1

## 2015-03-27 MED ORDER — IOHEXOL 350 MG/ML SOLN
100.0000 mL | Freq: Once | INTRAVENOUS | Status: AC | PRN
  Administered 2015-03-27: 100 mL via INTRAVENOUS

## 2015-03-27 MED ORDER — HYDROMORPHONE HCL 1 MG/ML IJ SOLN: 1.0000 mg | Freq: Once | INTRAMUSCULAR | Status: DC

## 2015-03-27 MED ORDER — ONDANSETRON HCL 4 MG/2ML IJ SOLN: 4.0000 mg | Freq: Four times a day (QID) | INTRAMUSCULAR | Status: DC | PRN

## 2015-03-27 MED ORDER — ONDANSETRON HCL 4 MG/2ML IJ SOLN
4.0000 mg | Freq: Once | INTRAMUSCULAR | Status: AC
  Administered 2015-03-27: 4 mg via INTRAVENOUS

## 2015-03-27 NOTE — Telephone Encounter (Signed)
Ok well I'm glad he went to the ER

## 2015-03-27 NOTE — ED Notes (Addendum)
Patient returned from scanner.  No acute distress noted.  Monitor reapplied.

## 2015-03-27 NOTE — ED Notes (Addendum)
Pt reports right sided chest pain that radiates under right breast, radiates to right shoulder blade and back. Pt with shortness of breath and back pain. Pt reports he recently stopped using blood thinners due to knee replacement.

## 2015-03-27 NOTE — Telephone Encounter (Signed)
Spoke with pt;Pt is at [facility] ED now; pt said was going to be admitted due to blood clots in lungs.

## 2015-03-27 NOTE — Progress Notes (Signed)
*  PRELIMINARY RESULTS* Echocardiogram 2D Echocardiogram has been performed.  Richard [name] 03/27/2015, 7:54 PM

## 2015-03-27 NOTE — Progress Notes (Signed)
ANTICOAGULATION CONSULT NOTE - Initial Consult  Pharmacy Consult for Heparin Drip  Indication: pulmonary embolus  Allergies  Allergen Reactions  . Penicillins Other (See Comments)    Reaction:  Unknown     Patient Measurements: Height: 5\' 9"  (175.3 cm) Weight: 299 lb (135.626 kg) IBW/kg (Calculated) : 70.7 Heparin Dosing Weight: 102.5 kg  Vital Signs: Temp: 98.9 F (37.2 C) (03/06 1433) Temp Source: Oral (03/06 1433) BP: 177/116 mmHg (03/06 1600) Pulse Rate: 96 (03/06 1600)  Labs:  Recent Labs  03/27/15 1251  HGB 13.8  HCT 42.7  PLT 240  LABPROT 14.7  INR 1.13  CREATININE 0.94  TROPONINI <0.03    Estimated Creatinine Clearance: 122.9 mL/min (by C-G formula based on Cr of 0.94).   Medical History: Past Medical History  Diagnosis Date  . Obesity   . Hypertension 08/17/2011  . Chest pain 08/16/2011    MI ruled out.  Marland Kitchen LVH (left ventricular hypertrophy) 08/17/2011    Per 2-D echocardiogram.  . GERD (gastroesophageal reflux disease) 08/17/2011  . Arthritis   . Urine incontinence   . Primary localized osteoarthritis of right knee 02/25/2014  . Dysrhythmia     TACHY  . Primary localized osteoarthritis of left knee 12/06/2014  . OSA (obstructive sleep apnea)     uses CPAP nightly  . Stiffness of left knee 01/20/2015    Medications:  Scheduled:  . heparin  1,700 Units/hr Intravenous Once   Assessment: 55 y.o. male who presents to ER with chest pain that radiates in his right breast and shoulder blade. Patient also pain into his back with shortness of breath. Patient states he recently stopped using blood thinners due to a knee replacement. Pharmacy to dose Heparin Drip for PE. Goal of Therapy:  Heparin level 0.3-0.7 units/ml   Plan:  Give 4000 units bolus x 1 Start heparin infusion at 1700 units/hr Check anti-Xa level in 6 hours and daily while on heparin Continue to monitor H&H and platelets   Anti-Xa level ordered for 03/27/15 at  21:00.  Olivia Canter, RPh Clinical Pharmacist 03/27/2015,4:17 PM

## 2015-03-27 NOTE — ED Provider Notes (Signed)
[facility] Emergency Department Provider Note     Time seen: ----------------------------------------- 2:13 PM on 03/27/2015 -----------------------------------------    I have reviewed the triage vital signs and the nursing notes.   HISTORY  Chief Complaint Chest Pain    HPI Richard [name] is a 55 y.o. male who presents to ER with chest pain that radiates in his right breast and shoulder blade. Patient also pain into his back with shortness of breath. Patient states he recently stopped using blood thinners due to a knee replacement.Patient states he was helping someone lift something yesterday and about 10 minutes after that he began having very sharp right-sided chest pain. Nothing makes his symptoms better.  Past Medical History  Diagnosis Date  . Obesity   . Hypertension 08/17/2011  . Chest pain 08/16/2011    MI ruled out.  Marland Kitchen LVH (left ventricular hypertrophy) 08/17/2011    Per 2-D echocardiogram.  . GERD (gastroesophageal reflux disease) 08/17/2011  . Arthritis   . Urine incontinence   . Primary localized osteoarthritis of right knee 02/25/2014  . Dysrhythmia     TACHY  . Primary localized osteoarthritis of left knee 12/06/2014  . OSA (obstructive sleep apnea)     uses CPAP nightly  . Stiffness of left knee 01/20/2015    Patient Active Problem List   Diagnosis Date Noted  . Cephalalgia 03/03/2015  . Pleuritic chest pain 03/03/2015  . Stiffness of left knee 01/20/2015  . Primary localized osteoarthritis of left knee 12/06/2014  . Knee osteoarthritis 12/06/2014  . Diastolic dysfunction [id number]  . Primary localized osteoarthritis of right knee 02/25/2014  . S/P right unicompartmental knee replacement 02/25/2014  . Abnormal nuclear stress test   . Arthritis 03/16/2013  . Hypertension 08/17/2011  . LVH (left ventricular hypertrophy) 08/17/2011  . GERD (gastroesophageal reflux disease) 08/17/2011  . Morbid obesity ([place]) 08/16/2011  .  OSA (obstructive sleep apnea) 08/16/2011    Past Surgical History  Procedure Laterality Date  . Appendectomy    . Orthopedic surgery Left     arthroscopy x3  . Left heart catheterization with coronary angiogram N/A 01/12/2014    Procedure: LEFT HEART CATHETERIZATION WITH CORONARY ANGIOGRAM;  Surgeon: Jettie Booze, MD;  Location: [facility] CATH LAB;  Service: Cardiovascular;  Laterality: N/A;  . Partial knee arthroplasty Right 02/25/2014    Procedure: RIGHT KNEE ARTHROPLASTY CONDYLE AND PLATEAU MEDIAL COMPARTMENT ;  Surgeon: Johnny Bridge, MD;  Location: [place];  Service: Orthopedics;  Laterality: Right;  . Joint replacement    . Total knee arthroplasty  12/06/2014    Procedure: TOTAL KNEE ARTHROPLASTY;  Surgeon: Marchia Bond, MD;  Location: [place];  Service: Orthopedics;;  . Knee closed reduction Left 01/20/2015    Procedure: LEFT KNEE MANIPULATION;  Surgeon: Marchia Bond, MD;  Location: [place];  Service: Orthopedics;  Laterality: Left;    Allergies Penicillins  Social History Social History  Substance Use Topics  . Smoking status: Never Smoker   . Smokeless tobacco: Never Used  . Alcohol Use: Yes     Comment: occassional    Review of Systems Constitutional: Negative for fever. Eyes: Negative for visual changes. ENT: Negative for sore throat. Cardiovascular: Positive for chest pain Respiratory: Positive for shortness of breath Gastrointestinal: Negative for abdominal pain, vomiting and diarrhea. Genitourinary: Negative for dysuria. Musculoskeletal: Negative for back pain. Skin: Negative for rash. Neurological: Negative for headaches, focal weakness or numbness.  10-point ROS otherwise negative.  ____________________________________________  PHYSICAL EXAM:  VITAL SIGNS: ED Triage Vitals  Enc Vitals Group     BP 03/27/15 1257 130/87 mmHg     Pulse Rate 03/27/15 1257 95     Resp 03/27/15 1257 28     Temp 03/27/15 1257 97.5  F (36.4 C)     Temp Source 03/27/15 1257 Oral     SpO2 03/27/15 1257 94 %     Weight 03/27/15 1250 299 lb (135.626 kg)     Height 03/27/15 1250 5\' 9"  (1.753 m)     Head Cir --      Peak Flow --      Pain Score 03/27/15 1250 10     Pain Loc --      Pain Edu? --      Excl. in [place]? --     Constitutional: Alert and oriented. Well appearing and in no distress. Eyes: Conjunctivae are normal. PERRL. Normal extraocular movements. ENT   Head: Normocephalic and atraumatic.   Nose: No congestion/rhinnorhea.   Mouth/Throat: Mucous membranes are moist.   Neck: No stridor. Cardiovascular: Normal rate, regular rhythm. Normal and symmetric distal pulses are present in all extremities. No murmurs, rubs, or gallops. Respiratory: Normal respiratory effort without tachypnea nor retractions. Breath sounds are clear and equal bilaterally. No wheezes/rales/rhonchi. Gastrointestinal: Soft and nontender. No distention. No abdominal bruits.  Musculoskeletal: Left lower extremity edema, postsurgical site appears normal. Good distal pulses. Neurologic:  Normal speech and language. No gross focal neurologic deficits are appreciated. Speech is normal. No gait instability. Skin:  Skin is warm, dry and intact. No rash noted. Psychiatric: Mood and affect are normal. Speech and behavior are normal. Patient exhibits appropriate insight and judgment. ____________________________________________  EKG: Interpreted by me. Normal sinus rhythm with a rate of 93 bpm, normal PR interval, normal QRS, normal QT interval. Left anterior fascicular block, no evidence acute infarction  ____________________________________________  ED COURSE:  Pertinent labs & imaging results that were available during my care of the patient were reviewed by me and considered in my medical decision making (see chart for details). Patient with pleuritic symptoms status post surgical procedure. Clinical suspicion for PE is  high. ____________________________________________    LABS (pertinent positives/negatives)  Labs Reviewed  CBC - Abnormal; Notable for the following:    RDW 15.2 (*)    All other components within normal limits  BASIC METABOLIC PANEL  TROPONIN I  PROTIME-INR  LIPASE, BLOOD   CRITICAL CARE Performed by: Earleen Newport   Total critical care time: 30 minutes  Critical care time was exclusive of separately billable procedures and treating other patients.  Critical care was necessary to treat or prevent imminent or life-threatening deterioration.  Critical care was time spent personally by me on the following activities: development of treatment plan with patient and/or surrogate as well as nursing, discussions with consultants, evaluation of patient's response to treatment, examination of patient, obtaining history from patient or surrogate, ordering and performing treatments and interventions, ordering and review of laboratory studies, ordering and review of radiographic studies, pulse oximetry and re-evaluation of patient's condition.   RADIOLOGY Images were viewed by me  CT angiogram IMPRESSION: 1. Acute bilateral pulmonary emboli. Critical Value/emergent results were called by telephone at the time of interpretation on 03/27/2015 at 2:13 pm to Dr. Lenise Arena , who verbally acknowledged these results. ____________________________________________  FINAL ASSESSMENT AND PLAN  Pulmonary emboli  Plan: Patient with labs and imaging as dictated above. Patient with pulmonary emboli, likely DVT left lower  extremity. His been started on heparin, he agrees to being admitted to the hospital. Patient reports he was supposed be taking Xarelto but stopped it due to having the surgery.   Earleen Newport, MD   Earleen Newport, MD 03/27/15 [phone]

## 2015-03-27 NOTE — Telephone Encounter (Signed)
Can we try to contact pt to see how he is doing

## 2015-03-27 NOTE — Progress Notes (Signed)
ANTICOAGULATION CONSULT NOTE - Initial Consult  Pharmacy Consult for Heparin Drip  Indication: pulmonary embolus  Allergies  Allergen Reactions  . Penicillins Other (See Comments)    Reaction:  Unknown     Patient Measurements: Height: 5\' 9"  (175.3 cm) Weight: (!) 300 lb 8 oz (136.306 kg) IBW/kg (Calculated) : 70.7 Heparin Dosing Weight: 102.5 kg  Vital Signs: Temp: 98.7 F (37.1 C) (03/06 2121) Temp Source: Oral (03/06 2121) BP: 120/79 mmHg (03/06 2121) Pulse Rate: 80 (03/06 2121)  Labs:  Recent Labs  03/27/15 1251 03/27/15 2105  HGB 13.8  --   HCT 42.7  --   PLT 240  --   LABPROT 14.7  --   INR 1.13  --   HEPARINUNFRC  --  <0.10*  CREATININE 0.94  --   TROPONINI <0.03  --     Estimated Creatinine Clearance: 123.1 mL/min (by C-G formula based on Cr of 0.94).   Medical History: Past Medical History  Diagnosis Date  . Obesity   . Hypertension 08/17/2011  . Chest pain 08/16/2011    MI ruled out.  Marland Kitchen LVH (left ventricular hypertrophy) 08/17/2011    Per 2-D echocardiogram.  . GERD (gastroesophageal reflux disease) 08/17/2011  . Arthritis   . Urine incontinence   . Primary localized osteoarthritis of right knee 02/25/2014  . Dysrhythmia     TACHY  . Primary localized osteoarthritis of left knee 12/06/2014  . OSA (obstructive sleep apnea)     uses CPAP nightly  . Stiffness of left knee 01/20/2015    Medications:  Scheduled:  . aspirin EC  81 mg Oral Daily  . heparin  3,000 Units Intravenous Once  . HYDROmorphone   Intravenous 6 times per day  . metoprolol succinate  12.5 mg Oral BID  . pantoprazole  40 mg Oral Daily  . senna-docusate  1 tablet Oral Daily   Assessment: 55 y.o. male who presents to ER with chest pain that radiates in his right breast and shoulder blade. Patient also pain into his back with shortness of breath. Patient states he recently stopped using blood thinners due to a knee replacement. Pharmacy to dose Heparin Drip for PE. Goal of  Therapy:  Heparin level 0.3-0.7 units/ml   Plan:  Give 4000 units bolus x 1 Start heparin infusion at 1700 units/hr Check anti-Xa level in 6 hours and daily while on heparin Continue to monitor H&H and platelets   Anti-Xa level ordered for 03/27/15 at 21:00.  3/06:  HL @ 21:00 = 0.1 Will order Heparin 3000 units IV X 1 bolus and increase drip rate to 2000 units/hr. Will draw next HL 6 hrs after rate change.   [place] Pharmacist 03/27/2015,10:47 PM

## 2015-03-27 NOTE — H&P (Signed)
[facility] [place] NAME: Richard [name]    MR#:  [id number]  DATE OF BIRTH:  01/31/1960  DATE OF ADMISSION:  03/27/2015  PRIMARY CARE PHYSICIAN: Webb Silversmith, NP   REQUESTING/REFERRING PHYSICIAN: Gwyndolyn Saxon  CHIEF COMPLAINT:   Chief Complaint  Patient presents with  . Chest Pain    HISTORY OF PRESENT ILLNESS: Richard [name]  is a 55 y.o. male with a known history of hypertension, chest pain, left ventricular hypertrophic, urinary incontinence, obstructive sleep apnea on CPAP use, bilateral knee arthritis status post surgeries. For last few days he is feeling little bit more short of breath and tired, but today in the morning around 11 he suddenly started CVA or pain in his right side of the chest which is sharp and stabbing and cannot take deep breath. Noted to have bilateral pulmonary emboli in ER and requiring IV pain medication to control the pain, also slightly hypoxic and requiring oxygen. He denies any past history of blood clots, he had bilateral knee surgeries done in the last year and he was given a short course of oral anticoagulation after the surgery as a prevention.  PAST MEDICAL HISTORY:   Past Medical History  Diagnosis Date  . Obesity   . Hypertension 08/17/2011  . Chest pain 08/16/2011    MI ruled out.  Marland Kitchen LVH (left ventricular hypertrophy) 08/17/2011    Per 2-D echocardiogram.  . GERD (gastroesophageal reflux disease) 08/17/2011  . Arthritis   . Urine incontinence   . Primary localized osteoarthritis of right knee 02/25/2014  . Dysrhythmia     TACHY  . Primary localized osteoarthritis of left knee 12/06/2014  . OSA (obstructive sleep apnea)     uses CPAP nightly  . Stiffness of left knee 01/20/2015    PAST SURGICAL HISTORY: Past Surgical History  Procedure Laterality Date  . Appendectomy    . Orthopedic surgery Left     arthroscopy x3  . Left heart catheterization with coronary angiogram N/A 01/12/2014     Procedure: LEFT HEART CATHETERIZATION WITH CORONARY ANGIOGRAM;  Surgeon: Jettie Booze, MD;  Location: [facility] CATH LAB;  Service: Cardiovascular;  Laterality: N/A;  . Partial knee arthroplasty Right 02/25/2014    Procedure: RIGHT KNEE ARTHROPLASTY CONDYLE AND PLATEAU MEDIAL COMPARTMENT ;  Surgeon: Johnny Bridge, MD;  Location: [place];  Service: Orthopedics;  Laterality: Right;  . Joint replacement    . Total knee arthroplasty  12/06/2014    Procedure: TOTAL KNEE ARTHROPLASTY;  Surgeon: Marchia Bond, MD;  Location: [place];  Service: Orthopedics;;  . Knee closed reduction Left 01/20/2015    Procedure: LEFT KNEE MANIPULATION;  Surgeon: Marchia Bond, MD;  Location: [place];  Service: Orthopedics;  Laterality: Left;    SOCIAL HISTORY:  Social History  Substance Use Topics  . Smoking status: Never Smoker   . Smokeless tobacco: Never Used  . Alcohol Use: Yes     Comment: occassional    FAMILY HISTORY:  Family History  Problem Relation Age of Onset  . Coronary artery disease Mother   . Hyperlipidemia Mother   . Hypertension Mother   . Arthritis Mother   . Coronary artery disease Father   . Heart disease Paternal Grandmother   . Alzheimer's disease Paternal Grandfather     DRUG ALLERGIES:  Allergies  Allergen Reactions  . Penicillins Other (See Comments)    Reaction:  Unknown     REVIEW OF SYSTEMS:  CONSTITUTIONAL: No fever, fatigue or weakness.  EYES: No blurred or double vision.  EARS, NOSE, AND THROAT: No tinnitus or ear pain.  RESPIRATORY: No cough, positive shortness of breath, no wheezing or hemoptysis.  CARDIOVASCULAR: severe chest pain,no orthopnea, edema.  GASTROINTESTINAL: No nausea, vomiting, diarrhea or abdominal pain.  GENITOURINARY: No dysuria, hematuria.  ENDOCRINE: No polyuria, nocturia. HEMATOLOGY: No anemia, easy bruising or bleeding SKIN: No rash or lesion. MUSCULOSKELETAL: No joint pain or arthritis.   NEUROLOGIC:  No tingling, numbness, weakness.  PSYCHIATRY: No anxiety or depression.   MEDICATIONS AT HOME:  Prior to Admission medications   Medication Sig Start Date End Date Taking? Authorizing Provider  aspirin EC 81 MG tablet Take 81 mg by mouth daily.   Yes Historical Provider, MD  baclofen (LIORESAL) 10 MG tablet Take 10 mg by mouth 3 (three) times daily as needed for muscle spasms.   Yes Historical Provider, MD  diclofenac (VOLTAREN) 75 MG EC tablet Take 1 tablet (75 mg total) by mouth 2 (two) times daily. 03/22/15  Yes Jearld Fenton, NP  esomeprazole (NEXIUM) 40 MG capsule Take 40 mg by mouth daily as needed (for reflux).    Yes Historical Provider, MD  furosemide (LASIX) 40 MG tablet Take 40 mg by mouth daily as needed for edema.   Yes Historical Provider, MD  HYDROcodone-acetaminophen (NORCO/VICODIN) 5-325 MG tablet Take 1 tablet by mouth every 6 (six) hours as needed for moderate pain.   Yes Historical Provider, MD  metoprolol succinate (TOPROL-XL) 25 MG 24 hr tablet Take 0.5 tablets (12.5 mg total) by mouth 2 (two) times daily. 10/18/14  Yes Jettie Booze, MD  senna-docusate (SENOKOT-S) 8.6-50 MG tablet Take 1 tablet by mouth daily.   Yes Historical Provider, MD  traMADol (ULTRAM) 50 MG tablet Take 50 mg by mouth every 12 (twelve) hours as needed for moderate pain.   Yes Historical Provider, MD      PHYSICAL EXAMINATION:   VITAL SIGNS: Blood pressure 136/81, pulse 89, temperature 98.9 F (37.2 C), temperature source Oral, resp. rate 18, height 5\' 9"  (1.753 m), weight 135.626 kg (299 lb), SpO2 96 %.  GENERAL:  55 y.o.-year-old patient lying in the bed with no acute distress.  EYES: Pupils equal, round, reactive to light and accommodation. No scleral icterus. Extraocular muscles intact.  HEENT: Head atraumatic, normocephalic. Oropharynx and nasopharynx clear.  NECK:  Supple, no jugular venous distention. No thyroid enlargement, no tenderness.  LUNGS: Normal breath sounds bilaterally, no  wheezing, rales,rhonchi or crepitation. No use of accessory muscles of respiration. No local tenderness. Using supplemental oxygen. CARDIOVASCULAR: S1, S2 normal. No murmurs, rubs, or gallops.  ABDOMEN: Soft, nontender, nondistended. Bowel sounds present. No organomegaly or mass.  EXTREMITIES: No pedal edema, cyanosis, or clubbing. Bilateral knee scars present status post surgery, left leg is slightly swollen compared to the right. NEUROLOGIC: Cranial nerves II through XII are intact. Muscle strength 5/5 in all extremities. Sensation intact. Gait not checked.  PSYCHIATRIC: The patient is alert and oriented x 3.  SKIN: No obvious rash, lesion, or ulcer.   LABORATORY PANEL:   CBC  Recent Labs Lab 03/27/15 1251  WBC 9.9  HGB 13.8  HCT 42.7  PLT 240  MCV 81.0  MCH 26.2  MCHC 32.3  RDW 15.2*   ------------------------------------------------------------------------------------------------------------------  Chemistries   Recent Labs Lab 03/27/15 1251  NA 140  K 4.3  CL 107  CO2 25  GLUCOSE 86  BUN 17  CREATININE 0.94  CALCIUM 9.1   ------------------------------------------------------------------------------------------------------------------  estimated creatinine clearance is 122.9 mL/min (by C-G formula based on Cr of 0.94). ------------------------------------------------------------------------------------------------------------------ No results for input(s): TSH, T4TOTAL, T3FREE, THYROIDAB in the last 72 hours.  Invalid input(s): FREET3   Coagulation profile  Recent Labs Lab 03/27/15 1251  INR 1.13   ------------------------------------------------------------------------------------------------------------------- No results for input(s): DDIMER in the last 72 hours. -------------------------------------------------------------------------------------------------------------------  Cardiac Enzymes  Recent Labs Lab 03/27/15 1251  TROPONINI <0.03    ------------------------------------------------------------------------------------------------------------------ Invalid input(s): POCBNP  ---------------------------------------------------------------------------------------------------------------  Urinalysis    Component Value Date/Time   COLORURINE YELLOW 08/16/2011 0529   APPEARANCEUR CLEAR 08/16/2011 0529   LABSPEC 1.030 08/16/2011 0529   PHURINE 6.0 08/16/2011 0529   GLUCOSEU NEGATIVE 08/16/2011 0529   HGBUR NEGATIVE 08/16/2011 0529   BILIRUBINUR NEGATIVE 08/16/2011 0529   KETONESUR NEGATIVE 08/16/2011 0529   PROTEINUR NEGATIVE 08/16/2011 0529   UROBILINOGEN 0.2 08/16/2011 0529   NITRITE NEGATIVE 08/16/2011 0529   LEUKOCYTESUR NEGATIVE 08/16/2011 0529     RADIOLOGY: Ct Angio Chest Pe W/cm &/or Wo Cm  03/27/2015  CLINICAL DATA:  Right-sided chest pain radiating under the right breast EXAM: CT ANGIOGRAPHY CHEST WITH CONTRAST TECHNIQUE: Multidetector CT imaging of the chest was performed using the standard protocol during bolus administration of intravenous contrast. Multiplanar CT image reconstructions and MIPs were obtained to evaluate the vascular anatomy. CONTRAST:  181mL OMNIPAQUE IOHEXOL 350 MG/ML SOLN COMPARISON:  None. FINDINGS: There is adequate opacification of the pulmonary arteries. There is pulmonary embolus in the right main pulmonary artery, right upper lobe lobar artery, left lower lobe and right lower lobe lobar artery and right middle lobe lobar pulmonary artery. The main pulmonary artery, right main pulmonary artery and left main pulmonary arteries are normal in size. The heart size is normal. There is no pericardial effusion. There is right middle lobe airspace disease peripherally which may reflect atelectasis versus pulmonary infarct. There is no pleural effusion or pneumothorax. Bilateral retroareolar fibroglandular tissue as can be seen with gynecomastia. There is no axillary, hilar, or mediastinal  adenopathy. There is no lytic or blastic osseous lesion. The visualized portions of the upper abdomen are unremarkable. Review of the MIP images confirms the above findings. IMPRESSION: 1. Acute bilateral pulmonary emboli. Critical Value/emergent results were called by telephone at the time of interpretation on 03/27/2015 at 2:13 pm to Dr. Lenise Arena , who verbally acknowledged these results. Electronically Signed   By: Kathreen Devoid   On: 03/27/2015 14:16    EKG: Orders placed or performed during the hospital encounter of 03/27/15  . EKG 12-Lead  . EKG 12-Lead  . ED EKG within 10 minutes  . ED EKG within 10 minutes    IMPRESSION AND PLAN: * Acute bilateral pulmonary emboli   Started on heparin IV drip, will continue that for now.  Monitor on telemetry, get echocardiogram.  Referral to hematologist as outpatient.  * Acute hypoxic respiratory failure.  Due to pulmonary emboli, continue supplemental oxygen for now.  * Swelling on left leg and has arthritis.  Get ultrasound Doppler study on the left lower extremity.  * Hypertension  Continue metoprolol.  * Left ventricular hypertrophy  Continue Lasix.  * Chest pain  This is secondary to pulmonary emboli, continue Dilaudid as needed.   All the records are reviewed and case discussed with ED provider. Management plans discussed with the patient, family and they are in agreement.  CODE STATUS: Full code. Code Status History    Date Active Date Inactive Code Status Order ID Comments User Context  12/06/2014  1:32 PM 12/09/2014  3:04 PM Full Code [id number]  Marchia Bond, MD Inpatient   02/25/2014  7:08 PM 02/26/2014  1:02 PM Full Code [id number]  Johnny Bridge, MD Inpatient   01/12/2014  9:32 AM 01/12/2014  2:58 PM Full Code [id number]  Jettie Booze, MD Inpatient   08/16/2011  1:45 AM 08/17/2011  4:01 PM Full Code [id number]  Marlane Hatcher, RN Inpatient       TOTAL TIME TAKING CARE OF THIS PATIENT: 50 minutes.   Plan discussed with his wife and other family members present in the room.  Vaughan Basta M.D on 03/27/2015   Between 7am to 6pm - Pager - [phone]  After 6pm go to [email or web address] - password [email or web address] [facility]  Office  [phone]  CC: Primary care physician; Webb Silversmith, NP   Note: This dictation was prepared with Dragon dictation along with smaller phrase technology. Any transcriptional errors that result from this process are unintentional.

## 2015-03-27 NOTE — ED Notes (Signed)
Patient transported to Ultrasound 

## 2015-03-27 NOTE — Telephone Encounter (Signed)
Attempted to contact pt but no answer and no v/m set up.

## 2015-03-27 NOTE — ED Notes (Signed)
Patient transported to CT 

## 2015-03-27 NOTE — Telephone Encounter (Signed)
Patient Name: Richard [name] DOB: 09-28-1960 Initial Comment Caller states c/o chest pain, hurts to take a deep breath Nurse Assessment Nurse: Ronnald Ramp, RN, Miranda Date/Time (Eastern Time): 03/27/2015 11:52:28 AM Confirm and document reason for call. If symptomatic, describe symptoms. You must click the next button to save text entered. ---Caller states he started having pain in chest 10 min ago when he picked up a piece of plywood. Hurts when changing positions or taking a deep breath. Denies any tenderness to the touch. Has the patient traveled out of the country within the last 30 days? ---Not Applicable Does the patient have any new or worsening symptoms? ---Yes Will a triage be completed? ---Yes Related visit to physician within the last 2 weeks? ---No Does the PT have any chronic conditions? (i.e. diabetes, asthma, etc.) ---Yes List chronic conditions. ---Irregular Heart rate, Arthritis, HTN, Edema Is this a behavioral health or substance abuse call? ---No Guidelines Guideline Title Affirmed Question Affirmed Notes Chest Injury - Bending Lifting or Twisting [1] MODERATE pain (e.g., interferes with normal activities) AND [2] pain is WORSE with breathing Final Disposition User See Physician within 4 Hours (or PCP triage) Ronnald Ramp, RN, Miranda Comments Attempted to schedule appt for the patient. He is not able to be seen at the time available. Offered to check at other locations and he declined. Recommended he go to UC to be seen and he states never mind, if he cannot be seen by his doctor he will "just suffer" and disconnected. Referrals GO TO FACILITY REFUSED Disagree/Comply: Disagree Disagree/Comply Reason: Disagree with instructions

## 2015-03-28 DIAGNOSIS — I2609 Other pulmonary embolism with acute cor pulmonale: Secondary | ICD-10-CM

## 2015-03-28 DIAGNOSIS — I82432 Acute embolism and thrombosis of left popliteal vein: Secondary | ICD-10-CM

## 2015-03-28 LAB — BLOOD GAS, ARTERIAL
Acid-Base Excess: 1.6 mmol/L (ref 0.0–3.0)
Allens test (pass/fail): POSITIVE — AB
Bicarbonate: 29.2 mEq/L — ABNORMAL HIGH (ref 21.0–28.0)
Delivery systems: POSITIVE
FIO2: 0.44
O2 Saturation: 88 %
Patient temperature: 37
pCO2 arterial: 58 mmHg — ABNORMAL HIGH (ref 32.0–48.0)
pH, Arterial: 7.31 — ABNORMAL LOW (ref 7.350–7.450)
pO2, Arterial: 60 mmHg — ABNORMAL LOW (ref 83.0–108.0)

## 2015-03-28 LAB — BASIC METABOLIC PANEL
Anion gap: 6 (ref 5–15)
BUN: 18 mg/dL (ref 6–20)
CO2: 30 mmol/L (ref 22–32)
Calcium: 8.7 mg/dL — ABNORMAL LOW (ref 8.9–10.3)
Chloride: 103 mmol/L (ref 101–111)
Creatinine, Ser: 1.24 mg/dL (ref 0.61–1.24)
GFR calc Af Amer: >60 mL/min (ref >60)
GFR calc non Af Amer: >60 mL/min (ref >60)
Glucose, Bld: 107 mg/dL — ABNORMAL HIGH (ref 65–99)
Potassium: 4.6 mmol/L (ref 3.5–5.1)
Sodium: 139 mmol/L (ref 135–145)

## 2015-03-28 LAB — CBC
HCT: 37.5 % — ABNORMAL LOW (ref 40.0–52.0)
Hemoglobin: 12.3 g/dL — ABNORMAL LOW (ref 13.0–18.0)
MCH: 26.7 pg (ref 26.0–34.0)
MCHC: 32.7 g/dL (ref 32.0–36.0)
MCV: 81.8 fL (ref 80.0–100.0)
Platelets: 195 10*3/uL (ref 150–440)
RBC: 4.59 MIL/uL (ref 4.40–5.90)
RDW: 15.5 % — ABNORMAL HIGH (ref 11.5–14.5)
WBC: 9.3 10*3/uL (ref 3.8–10.6)

## 2015-03-28 LAB — MAGNESIUM: Magnesium: 1.9 mg/dL (ref 1.7–2.4)

## 2015-03-28 LAB — HEPARIN LEVEL (UNFRACTIONATED): Heparin Unfractionated: 0.34 IU/mL (ref 0.30–0.70)

## 2015-03-28 LAB — MRSA PCR SCREENING: MRSA by PCR: NEGATIVE

## 2015-03-28 MED ORDER — METOPROLOL TARTRATE 1 MG/ML IV SOLN: 5.0000 mg | Freq: Four times a day (QID) | INTRAVENOUS | Status: DC | PRN

## 2015-03-28 MED ORDER — METOPROLOL TARTRATE 1 MG/ML IV SOLN
INTRAVENOUS | Status: AC
  Administered 2015-03-28: 5 mg via INTRAVENOUS
  Filled 2015-03-28: qty 5

## 2015-03-28 MED ORDER — OXYCODONE-ACETAMINOPHEN 5-325 MG PO TABS: 1.0000 | ORAL_TABLET | Freq: Four times a day (QID) | ORAL | Status: DC | PRN

## 2015-03-28 MED ORDER — KETOROLAC TROMETHAMINE 15 MG/ML IJ SOLN
15.0000 mg | Freq: Once | INTRAMUSCULAR | Status: AC
  Administered 2015-03-28: 15 mg via INTRAVENOUS
  Filled 2015-03-28: qty 1

## 2015-03-28 MED ORDER — APIXABAN 5 MG PO TABS: 5.0000 mg | ORAL_TABLET | Freq: Two times a day (BID) | ORAL | Status: DC

## 2015-03-28 MED ORDER — MORPHINE SULFATE (PF) 2 MG/ML IV SOLN: 2.0000 mg | INTRAVENOUS | Status: DC | PRN

## 2015-03-28 MED ORDER — SENNOSIDES-DOCUSATE SODIUM 8.6-50 MG PO TABS
1.0000 | ORAL_TABLET | Freq: Two times a day (BID) | ORAL | Status: DC
  Administered 2015-03-28 – 2015-03-30 (×4): 1 via ORAL
  Filled 2015-03-28 (×4): qty 1

## 2015-03-28 MED ORDER — HYDROMORPHONE HCL 1 MG/ML IJ SOLN: 1.0000 mg | INTRAMUSCULAR | Status: DC | PRN

## 2015-03-28 MED ORDER — METOPROLOL TARTRATE 1 MG/ML IV SOLN
5.0000 mg | Freq: Four times a day (QID) | INTRAVENOUS | Status: DC
  Administered 2015-03-28: 5 mg via INTRAVENOUS

## 2015-03-28 MED ORDER — HEPARIN (PORCINE) IN NACL 100-0.45 UNIT/ML-% IJ SOLN
2000.0000 [IU]/h | INTRAMUSCULAR | Status: DC
  Filled 2015-03-28: qty 250

## 2015-03-28 MED ORDER — APIXABAN 5 MG PO TABS
10.0000 mg | ORAL_TABLET | Freq: Two times a day (BID) | ORAL | Status: DC
  Administered 2015-03-28 – 2015-03-29 (×3): 10 mg via ORAL
  Filled 2015-03-28 (×3): qty 2

## 2015-03-28 MED ORDER — ACETAMINOPHEN 325 MG PO TABS
650.0000 mg | ORAL_TABLET | Freq: Four times a day (QID) | ORAL | Status: DC | PRN
  Administered 2015-03-28 – 2015-03-31 (×4): 650 mg via ORAL
  Filled 2015-03-28 (×4): qty 2

## 2015-03-28 MED ORDER — MAGNESIUM SULFATE IN D5W 10-5 MG/ML-% IV SOLN
1.0000 g | Freq: Once | INTRAVENOUS | Status: AC
  Administered 2015-03-28: 1 g via INTRAVENOUS
  Filled 2015-03-28: qty 100

## 2015-03-28 MED ORDER — IPRATROPIUM-ALBUTEROL 0.5-2.5 (3) MG/3ML IN SOLN
3.0000 mL | Freq: Four times a day (QID) | RESPIRATORY_TRACT | Status: DC
  Administered 2015-03-28 – 2015-03-31 (×11): 3 mL via RESPIRATORY_TRACT
  Filled 2015-03-28 (×12): qty 3

## 2015-03-28 MED ORDER — CETYLPYRIDINIUM CHLORIDE 0.05 % MT LIQD
7.0000 mL | Freq: Two times a day (BID) | OROMUCOSAL | Status: DC
  Administered 2015-03-28 – 2015-04-03 (×10): 7 mL via OROMUCOSAL

## 2015-03-28 MED ORDER — HYDROCODONE-ACETAMINOPHEN 5-325 MG PO TABS: 1.0000 | ORAL_TABLET | Freq: Four times a day (QID) | ORAL | Status: DC | PRN

## 2015-03-28 MED ORDER — METOPROLOL TARTRATE 1 MG/ML IV SOLN
5.0000 mg | Freq: Four times a day (QID) | INTRAVENOUS | Status: DC
Start: 2015-03-28 — End: 2015-03-28

## 2015-03-28 NOTE — Progress Notes (Signed)
Pt had an 11 beat run of SVT (HR 112). Dr. Ashok Cordia notified. New order to draw Mg level ordered.

## 2015-03-28 NOTE — Progress Notes (Signed)
PT Cancellation Note  Patient Details Name: Richard [name] MRN: [id number] DOB: 07/19/1960   Cancelled Treatment:    Reason Eval/Treat Not Completed: Medical issues which prohibited therapy (Consult received and chart reviewed.  Patient diagnosed with acute PE 3/6, started heparin drip 3/6 at 1450. Per policy, required for anticoagulation x48 hours prior to initiation of PT services.  Will hold until 3/8 after 1450 unless otherwise directed.)   Yoav Okane H. Owens Shark, PT, DPT, NCS 03/28/2015, 8:40 AM [phone]

## 2015-03-28 NOTE — Consult Note (Signed)
Patient counseled on apixaban for PE treatment. All questions discussed and answered to patient satisfaction. Pt given apixaban handout. 

## 2015-03-28 NOTE — Care Management (Signed)
Patient is off bipap. Spoke with him about Eliquis and his insurance.  Says he does have pharmacy coverage.  Informed patient of the Eliquis coupons- one for 30 day ID [id number] and 10 dollar [id number].  Patient requests that coupons not be left with him while he is in icu .  CM will give thenm to patient when he moves out of icu- back to 2A

## 2015-03-28 NOTE — Progress Notes (Addendum)
Notified Dr Melynda Ripple and RT of SATs dropping to low 80s and70s. ABG and bipap ordered. Pt A&O.

## 2015-03-28 NOTE — Consult Note (Signed)
ANTICOAGULATION CONSULT NOTE - Initial Consult  Pharmacy Consult for Eliquis Indication: pulmonary embolus  Allergies  Allergen Reactions  . Penicillins Other (See Comments)    Reaction:  Unknown     Patient Measurements: Height: 5\' 9"  (175.3 cm) Weight: (!) 303 lb 9.2 oz (137.7 kg) IBW/kg (Calculated) : 70.7  Vital Signs: Temp: 99.6 F (37.6 C) (03/07 0900) Temp Source: Oral (03/07 0900) BP: 150/82 mmHg (03/07 1300) Pulse Rate: 76 (03/07 1300)  Labs:  Recent Labs  03/27/15 1251 03/27/15 2105 03/28/15 0431  HGB 13.8  --  12.3*  HCT 42.7  --  37.5*  PLT 240  --  195  LABPROT 14.7  --   --   INR 1.13  --   --   HEPARINUNFRC  --  <0.10* 0.34  CREATININE 0.94  --  1.24  TROPONINI <0.03  --   --     Estimated Creatinine Clearance: 93.9 mL/min (by C-G formula based on Cr of 1.24).   Medical History: Past Medical History  Diagnosis Date  . Obesity   . Hypertension 08/17/2011  . Chest pain 08/16/2011    MI ruled out.  Marland Kitchen LVH (left ventricular hypertrophy) 08/17/2011    Per 2-D echocardiogram.  . GERD (gastroesophageal reflux disease) 08/17/2011  . Arthritis   . Urine incontinence   . Primary localized osteoarthritis of right knee 02/25/2014  . Dysrhythmia     TACHY  . Primary localized osteoarthritis of left knee 12/06/2014  . OSA (obstructive sleep apnea)     uses CPAP nightly  . Stiffness of left knee 01/20/2015    Medications:  Scheduled:  . antiseptic oral rinse  7 mL Mouth Rinse BID  . apixaban  10 mg Oral BID   Followed by  . [START ON 04/04/2015] apixaban  5 mg Oral BID  . aspirin EC  81 mg Oral Daily  . HYDROmorphone   Intravenous 6 times per day  . ipratropium-albuterol  3 mL Nebulization Q6H  . metoprolol succinate  12.5 mg Oral BID  . pantoprazole  40 mg Oral Daily  . senna-docusate  1 tablet Oral BID   Assessment: Richard [name] is a 55 yo male admitted for PE. Pharmacy consulted to dose eliquis after heparin gtt D/C'd.    Plan:  Will D/c heparin and  give first apixaban dose at same time. Will start apixaban 10mg  PO BID x7 days followed by 5mg  PO BID.  Pharmacy will continue to monitor for changes in renal function that would require dose adjustment.  Vena Rua 03/28/2015,1:57 PM

## 2015-03-28 NOTE — Progress Notes (Signed)
[facility] Progress Note Patient Name: Richard [name] DOB: Jan 14, 1961 MRN: [id number]   Date of Service  03/28/2015  HPI/Events of Note  Nurse notified of asymptomatic 11 beat run of wide complex tachycardia. Magnesium not available. Potassium 4.6 this morning.  eICU Interventions  1. Close monitoring on telemetry 2. Stat magnesium level     Intervention Category Intermediate Interventions: Arrhythmia - evaluation and management  Tera Partridge 03/28/2015, 8:08 PM

## 2015-03-28 NOTE — Consult Note (Signed)
[facility] Consultation      Date: 03/28/2015,   MRN# [id number] FALON HARBOTTLE 01/08/1961 Code Status:     Code Status Orders        Start     Ordered   03/27/15 1728  Full code   Continuous     03/27/15 1727    Code Status History    Date Active Date Inactive Code Status Order ID Comments User Context   12/06/2014  1:32 PM 12/09/2014  3:04 PM Full Code [id number]  Marchia Bond, MD Inpatient   02/25/2014  7:08 PM 02/26/2014  1:02 PM Full Code [id number]  Johnny Bridge, MD Inpatient   01/12/2014  9:32 AM 01/12/2014  2:58 PM Full Code [id number]  Jettie Booze, MD Inpatient   08/16/2011  1:45 AM 08/17/2011  4:01 PM Full Code [id number]  Marlane Hatcher, RN Inpatient     Hosp day:@LENGTHOFSTAYDAYS @ Referring MD: @ATDPROV @     PCP:      AdmissionWeight: 299 lb (135.626 kg)                 CurrentWeight: (!) 300 lb 8 oz (136.306 kg) Richard [name] is a 55 y.o. old male seen in consultation for PE at the request of Dr. Bridgett Larsson     CHIEF COMPLAINT:   Acute SOB   HISTORY OF PRESENT ILLNESS   55 yo white male with acute SOB and left leg swelling for past several days CT chest reveals b/l PE. Patient has been bed ridden for 2 weeks and states  That he had the flu Patient had Left leg knee surgery in 09/2014. Patient had redo surgery in Dec. 2016 [place] lower ext reveal occlusive DVT of left leg  Patient states that he wears CPAP at night for OSA, patient feels much better today Patient does not have any blood pressure issues at this time. Denies fevers,chills, NVD   PAST MEDICAL HISTORY   Past Medical History  Diagnosis Date  . Obesity   . Hypertension 08/17/2011  . Chest pain 08/16/2011    MI ruled out.  Marland Kitchen LVH (left ventricular hypertrophy) 08/17/2011    Per 2-D echocardiogram.  . GERD (gastroesophageal reflux disease) 08/17/2011  . Arthritis   . Urine incontinence   . Primary localized osteoarthritis of right knee 02/25/2014  . Dysrhythmia     TACHY  . Primary localized osteoarthritis of left knee 12/06/2014  . OSA (obstructive sleep apnea)     uses CPAP nightly  . Stiffness of left knee 01/20/2015     SURGICAL HISTORY   Past Surgical History  Procedure Laterality Date  . Appendectomy    . Orthopedic surgery Left     arthroscopy x3  . Left heart catheterization with coronary angiogram N/A 01/12/2014    Procedure: LEFT HEART CATHETERIZATION WITH CORONARY ANGIOGRAM;  Surgeon: Jettie Booze, MD;  Location: [facility] CATH LAB;  Service: Cardiovascular;  Laterality: N/A;  . Partial knee arthroplasty Right 02/25/2014    Procedure: RIGHT KNEE ARTHROPLASTY CONDYLE AND PLATEAU MEDIAL COMPARTMENT ;  Surgeon: Johnny Bridge, MD;  Location: [place];  Service: Orthopedics;  Laterality: Right;  . Joint replacement    . Total knee arthroplasty  12/06/2014    Procedure: TOTAL KNEE ARTHROPLASTY;  Surgeon: Marchia Bond, MD;  Location: [place];  Service: Orthopedics;;  . Knee closed reduction Left 01/20/2015    Procedure: LEFT KNEE MANIPULATION;  Surgeon: Marchia Bond, MD;  Location: [place];  Service: Orthopedics;  Laterality: Left;     FAMILY HISTORY   Family History  Problem Relation Age of Onset  . Coronary artery disease Mother   . Hyperlipidemia Mother   . Hypertension Mother   . Arthritis Mother   . Coronary artery disease Father   . Heart disease Paternal Grandmother   . Alzheimer's disease Paternal Grandfather      SOCIAL HISTORY   Social History  Substance Use Topics  . Smoking status: Never Smoker   . Smokeless tobacco: Never Used  . Alcohol Use: Yes     Comment: occassional     MEDICATIONS    Home Medication:  No current outpatient prescriptions on file.  Current Medication:  Current facility-administered medications:  .  aspirin EC tablet 81 mg, 81 mg, Oral, Daily, Vaughan Basta, MD, 81 mg at 03/27/15 1841 .  baclofen (LIORESAL) tablet 10 mg, 10 mg, Oral, TID  PRN, Vaughan Basta, MD .  diphenhydrAMINE (BENADRYL) injection 12.5 mg, 12.5 mg, Intravenous, Q6H PRN **OR** diphenhydrAMINE (BENADRYL) 12.5 MG/5ML elixir 12.5 mg, 12.5 mg, Oral, Q6H PRN, Vaughan Basta, MD .  furosemide (LASIX) tablet 40 mg, 40 mg, Oral, Daily PRN, Vaughan Basta, MD .  heparin ADULT infusion 100 units/mL (25000 units/250 mL), 2,000 Units/hr, Intravenous, Continuous, Earleen Newport, MD, 2,000 Units/hr at 03/28/15 0402 .  HYDROcodone-acetaminophen (NORCO/VICODIN) 5-325 MG per tablet 1 tablet, 1 tablet, Oral, Q6H PRN, Vaughan Basta, MD, 1 tablet at 03/27/15 1842 .  HYDROmorphone (DILAUDID) 1 mg/mL PCA injection, , Intravenous, 6 times per day, Vaughan Basta, MD, 1 mg at 03/27/15 2035 .  HYDROmorphone (DILAUDID) injection 1 mg, 1 mg, Intravenous, Q4H PRN, Demetrios Loll, MD .  metoprolol (LOPRESSOR) injection 5 mg, 5 mg, Intravenous, Q6H PRN, Saundra Shelling, MD .  metoprolol succinate (TOPROL-XL) 24 hr tablet 12.5 mg, 12.5 mg, Oral, BID, Vaughan Basta, MD, 12.5 mg at 03/27/15 2149 .  naloxone [facility]) injection 0.4 mg, 0.4 mg, Intravenous, PRN **AND** sodium chloride flush (NS) 0.9 % injection 9 mL, 9 mL, Intravenous, PRN, Vaughan Basta, MD .  ondansetron (ZOFRAN) injection 4 mg, 4 mg, Intravenous, Q6H PRN, Vaughan Basta, MD .  pantoprazole (PROTONIX) EC tablet 40 mg, 40 mg, Oral, Daily, Vaughan Basta, MD, 40 mg at 03/27/15 1841 .  senna-docusate (Senokot-S) tablet 1 tablet, 1 tablet, Oral, Daily, Vaughan Basta, MD, 1 tablet at 03/27/15 1836    ALLERGIES   Penicillins     REVIEW OF SYSTEMS   Review of Systems  Constitutional: Negative for fever, chills, weight loss and malaise/fatigue.  HENT: Negative for congestion and hearing loss.   Eyes: Negative for blurred vision and double vision.  Respiratory: Positive for shortness of breath. Negative for cough, hemoptysis, sputum production and wheezing.    Cardiovascular: Positive for leg swelling. Negative for chest pain, palpitations and orthopnea.  Gastrointestinal: Negative for heartburn, nausea, vomiting and abdominal pain.  Genitourinary: Negative for dysuria and urgency.  Musculoskeletal: Positive for joint pain. Negative for myalgias.  Skin: Negative for rash.  Neurological: Negative for dizziness, tingling, tremors and headaches.  Endo/Heme/Allergies: Does not bruise/bleed easily.  Psychiatric/Behavioral: The patient is not nervous/anxious.   All other systems reviewed and are negative.    VS: BP 128/86 mmHg  Pulse 89  Temp(Src) 99 F (37.2 C) (Oral)  Resp 23  Ht 5\' 9"  (1.753 m)  Wt 300 lb 8 oz (136.306 kg)  BMI 44.36 kg/m2  SpO2 100%     PHYSICAL EXAM  Physical Exam  Constitutional: He is  oriented to person, place, and time. He appears well-developed and well-nourished. No distress.  HENT:  Head: Normocephalic and atraumatic.  Mouth/Throat: No oropharyngeal exudate.  Eyes: EOM are normal. Pupils are equal, round, and reactive to light. No scleral icterus.  Neck: Normal range of motion. Neck supple.  Cardiovascular: Normal rate, regular rhythm and normal heart sounds.   No murmur heard. Pulmonary/Chest: No stridor. No respiratory distress. He has no wheezes.  Abdominal: Soft. Bowel sounds are normal. He exhibits no distension. There is no tenderness. There is no rebound.  Musculoskeletal: Normal range of motion. He exhibits no edema.  Left leg swelling  Neurological: He is alert and oriented to person, place, and time. Coordination normal.  Skin: Skin is warm. He is not diaphoretic.  Psychiatric: He has a normal mood and affect.        LABS    Recent Labs     03/27/15  1251  03/28/15  0431  HGB  13.8  12.3*  HCT  42.7  37.5*  MCV  81.0  81.8  WBC  9.9  9.3  BUN  17  18  CREATININE  0.94  1.24  GLUCOSE  86  107*  CALCIUM  9.1  8.7*  INR  1.13   --   ,      IMAGING    Ct Angio Chest Pe  W/cm &/or Wo Cm  03/27/2015  CLINICAL DATA:  Right-sided chest pain radiating under the right breast EXAM: CT ANGIOGRAPHY CHEST WITH CONTRAST TECHNIQUE: Multidetector CT imaging of the chest was performed using the standard protocol during bolus administration of intravenous contrast. Multiplanar CT image reconstructions and MIPs were obtained to evaluate the vascular anatomy. CONTRAST:  151mL OMNIPAQUE IOHEXOL 350 MG/ML SOLN COMPARISON:  None. FINDINGS: There is adequate opacification of the pulmonary arteries. There is pulmonary embolus in the right main pulmonary artery, right upper lobe lobar artery, left lower lobe and right lower lobe lobar artery and right middle lobe lobar pulmonary artery. The main pulmonary artery, right main pulmonary artery and left main pulmonary arteries are normal in size. The heart size is normal. There is no pericardial effusion. There is right middle lobe airspace disease peripherally which may reflect atelectasis versus pulmonary infarct. There is no pleural effusion or pneumothorax. Bilateral retroareolar fibroglandular tissue as can be seen with gynecomastia. There is no axillary, hilar, or mediastinal adenopathy. There is no lytic or blastic osseous lesion. The visualized portions of the upper abdomen are unremarkable. Review of the MIP images confirms the above findings. IMPRESSION: 1. Acute bilateral pulmonary emboli. Critical Value/emergent results were called by telephone at the time of interpretation on 03/27/2015 at 2:13 pm to Dr. Lenise Arena , who verbally acknowledged these results. Electronically Signed   By: Kathreen Devoid   On: 03/27/2015 14:16   US Venous Img Lower Unilateral Left  03/27/2015  CLINICAL DATA:  Left lower extremity pain and edema. History of knee surgery on 09/2014 with subsequent manipulation on 12/2014. History of pulmonary embolism. EXAM: LEFT LOWER EXTREMITY VENOUS DOPPLER ULTRASOUND TECHNIQUE: Gray-scale sonography with graded compression, as  well as color Doppler and duplex ultrasound were performed to evaluate the lower extremity deep venous systems from the level of the common femoral vein and including the common femoral, femoral, profunda femoral, popliteal and calf veins including the posterior tibial, peroneal and gastrocnemius veins when visible. The superficial great saphenous vein was also interrogated. Spectral Doppler was utilized to evaluate flow at rest and with distal augmentation maneuvers  in the common femoral, femoral and popliteal veins. COMPARISON:  Chest CTA - earlier same day FINDINGS: Contralateral Common Femoral Vein: Respiratory phasicity is normal and symmetric with the symptomatic side. No evidence of thrombus. Normal compressibility. Common Femoral Vein: No evidence of thrombus. Normal compressibility, respiratory phasicity and response to augmentation. Saphenofemoral Junction: No evidence of thrombus. Normal compressibility and flow on color Doppler imaging. Profunda Femoral Vein: No evidence of thrombus. Normal compressibility and flow on color Doppler imaging. Femoral Vein: There is a minimal amount of hypoechoic occlusive thrombus within the distal aspect of the left femoral vein (represent images 14 and 30). Popliteal Vein: There is hypoechoic expansile thrombus throughout the imaged course of the left popliteal vein (image 15, 16, 31 and 34). Calf Veins: Suboptimally visualized. Superficial Great Saphenous Vein: No evidence of thrombus. Normal compressibility and flow on color Doppler imaging. Venous Reflux:  None. Other Findings:  None. IMPRESSION: The examination is positive for occlusive DVT within the distal aspect of the left femoral vein extending through the imaged course of the left popliteal vein. Electronically Signed   By: Sandi Mariscal M.D.   On: 03/27/2015 17:02         ASSESSMENT/PLAN   55 yo obese white male with acute SOB from acute b/L PE with acute DVT from immobilization with underlying  OSA  1.oxygen as needed 2.biPAP adn CPAP for OSA 3.continue heparin infusion-start oral anticoagulant 4.no need for IVC filter at this time and no indication for Thrombolytics at this time  OK TO TRANSFER TO GEN MED FLOOR   I have personally obtained a history, examined the patient, evaluated laboratory and independently reviewed imaging results, formulated the assessment and plan and placed orders.  The Patient requires high complexity decision making for assessment and support, frequent evaluation and titration of therapies, application of advanced monitoring technologies and extensive interpretation of multiple databases.   Patient  are satisfied with Plan of action and management. All questions answered  Corrin Parker, M.D.  [facility]  Medical Director [place] Director [facility] Cardio-Pulmonary Department

## 2015-03-28 NOTE — Care Management (Signed)
Patient transferred to icu 8A this morning from 2A.  He has home cpap. He has had knee replacement fall of 2016. Admitted with bilateral pulmonary embolus.  Last evening he was dropping his 02 sats while on cpap then changed to bipap.  Transferred to icu  stepdown due to need for continuous bipap.  Anticipate patient will again be able to transfer back to unit.  Anticipate need for Eliquis coupon

## 2015-03-28 NOTE — Progress Notes (Signed)
ANTICOAGULATION CONSULT NOTE - Initial Consult  Pharmacy Consult for Heparin Drip  Indication: pulmonary embolus  Allergies  Allergen Reactions  . Penicillins Other (See Comments)    Reaction:  Unknown     Patient Measurements: Height: 5\' 9"  (175.3 cm) Weight: (!) 300 lb 8 oz (136.306 kg) IBW/kg (Calculated) : 70.7 Heparin Dosing Weight: 102.5 kg  Vital Signs: Temp: 99 F (37.2 C) (03/07 0439) Temp Source: Oral (03/07 0439) BP: 128/86 mmHg (03/07 0441) Pulse Rate: 89 (03/07 0441)  Labs:  Recent Labs  03/27/15 1251 03/27/15 2105 03/28/15 0431  HGB 13.8  --  12.3*  HCT 42.7  --  37.5*  PLT 240  --  195  LABPROT 14.7  --   --   INR 1.13  --   --   HEPARINUNFRC  --  <0.10* 0.34  CREATININE 0.94  --  1.24  TROPONINI <0.03  --   --     Estimated Creatinine Clearance: 93.3 mL/min (by C-G formula based on Cr of 1.24).   Medical History: Past Medical History  Diagnosis Date  . Obesity   . Hypertension 08/17/2011  . Chest pain 08/16/2011    MI ruled out.  Marland Kitchen LVH (left ventricular hypertrophy) 08/17/2011    Per 2-D echocardiogram.  . GERD (gastroesophageal reflux disease) 08/17/2011  . Arthritis   . Urine incontinence   . Primary localized osteoarthritis of right knee 02/25/2014  . Dysrhythmia     TACHY  . Primary localized osteoarthritis of left knee 12/06/2014  . OSA (obstructive sleep apnea)     uses CPAP nightly  . Stiffness of left knee 01/20/2015    Medications:  Scheduled:  . aspirin EC  81 mg Oral Daily  . HYDROmorphone   Intravenous 6 times per day  . metoprolol succinate  12.5 mg Oral BID  . pantoprazole  40 mg Oral Daily  . senna-docusate  1 tablet Oral Daily   Assessment: 55 y.o. male who presents to ER with chest pain that radiates in his right breast and shoulder blade. Patient also pain into his back with shortness of breath. Patient states he recently stopped using blood thinners due to a knee replacement. Pharmacy to dose Heparin Drip for  PE. Goal of Therapy:  Heparin level 0.3-0.7 units/ml   Plan:  Give 4000 units bolus x 1 Start heparin infusion at 1700 units/hr Check anti-Xa level in 6 hours and daily while on heparin Continue to monitor H&H and platelets   Anti-Xa level ordered for 03/27/15 at 21:00.  3/06:  HL @ 21:00 = 0.1 Will order Heparin 3000 units IV X 1 bolus and increase drip rate to 2000 units/hr. Will draw next HL 6 hrs after rate change.   3/7 AM heparin level 0.34. Continue current rate. Recheck in 6 hours to confirm.  Sage Kopera S, RPh Clinical Pharmacist 03/28/2015,6:41 AM

## 2015-03-28 NOTE — Progress Notes (Signed)
Patient yelling in pain and clutching chest. PCA pump set up and patient using. On O2 at 4L. SATS dropping into the 80s. Respirations between 10 and 12/minute. Patient is alert and oriented however he is holding his breath due to pain on inspiration. Dr. Melynda Ripple notified. One time dose of dilaudid ordered and given. Patient also placed on Cpap. Will continue to monitor.

## 2015-03-28 NOTE — Progress Notes (Signed)
Patient heart rate noted to be up in the 140s and 150s. Irregular. Toprol ordered IV prn.

## 2015-03-28 NOTE — Progress Notes (Signed)
[facility] Progress Note Patient Name: DEYAN LEAVINS DOB: 10/24/1960 MRN: [id number]   Date of Service  03/28/2015  HPI/Events of Note  Magnesium 1.9 w/ 1 run WCT.  eICU Interventions  Magnesium sulfate 1gm IV now.     Intervention Category Intermediate Interventions: Electrolyte abnormality - evaluation and management  Tera Partridge 03/28/2015, 9:13 PM

## 2015-03-28 NOTE — Progress Notes (Signed)
[facility] Progress Note Patient Name: Richard [name] DOB: 05/06/1960 MRN: [id number]   Date of Service  03/28/2015  HPI/Events of Note  RN reports continued 10/10 pain with pleurisy from PE. Borderline BP. Currently on Dilaudid PCA.  eICU Interventions  Toradol 15mg  IV x1.     Intervention Category Intermediate Interventions: Pain - evaluation and management  Tera Partridge 03/28/2015, 8:55 PM

## 2015-03-28 NOTE — Progress Notes (Signed)
Patient currently on BIPAP at 45% FIO2 to maintain saturation. Patient will be moved to step down unit for close monitoring.

## 2015-03-28 NOTE — Progress Notes (Signed)
[facility] [place] NAME: Richard [name]    MR#:  [id number]  DATE OF BIRTH:  [birth year]  SUBJECTIVE:  CHIEF COMPLAINT:   Chief Complaint  Patient presents with  . Chest Pain  chest pain on right side while breathing. Respiratory distress, put on BIPAP, transfered to ICU.  REVIEW OF SYSTEMS:  CONSTITUTIONAL: No fever, has weakness.  EYES: No blurred or double vision.  EARS, NOSE, AND THROAT: No tinnitus or ear pain.  RESPIRATORY: has cough, shortness of breath, no wheezing or hemoptysis.  CARDIOVASCULAR: has right-sided chest pain, no orthopnea,  Left leg edema.  GASTROINTESTINAL: No nausea, vomiting, diarrhea or abdominal pain.  GENITOURINARY: No dysuria, hematuria.  ENDOCRINE: No polyuria, nocturia,  HEMATOLOGY: No anemia, easy bruising or bleeding SKIN: No rash or lesion. MUSCULOSKELETAL: No joint pain or arthritis.   NEUROLOGIC: No tingling, numbness, weakness.  PSYCHIATRY: No anxiety or depression.   DRUG ALLERGIES:   Allergies  Allergen Reactions  . Penicillins Other (See Comments)    Reaction:  Unknown     VITALS:  Blood pressure 128/86, pulse 89, temperature 99 F (37.2 C), temperature source Oral, resp. rate 23, height 5\' 9"  (1.753 m), weight 136.306 kg (300 lb 8 oz), SpO2 100 %.  PHYSICAL EXAMINATION:  GENERAL:  55 y.o.-year-old patient lying in the bed with no acute distress. Obese. EYES: Pupils equal, round, reactive to light and accommodation. No scleral icterus. Extraocular muscles intact.  HEENT: Head atraumatic, normocephalic. Oropharynx and nasopharynx clear.  NECK:  Supple, no jugular venous distention. No thyroid enlargement, no tenderness.  LUNGS: Normal breath sounds bilaterally, no wheezing, rales,rhonchi or crepitation. No use of accessory muscles of respiration.  CARDIOVASCULAR: S1, S2 normal. No murmurs, rubs, or gallops.  ABDOMEN: Soft, nontender, nondistended. Bowel sounds present. No  organomegaly or mass.  EXTREMITIES: trace edema on left leg, no cyanosis, or clubbing.  NEUROLOGIC: Cranial nerves II through XII are intact. Muscle strength 5/5 in all extremities. Sensation intact. Gait not checked.  PSYCHIATRIC: The patient is alert and oriented x 3.  SKIN: No obvious rash, lesion, or ulcer.    LABORATORY PANEL:   CBC  Recent Labs Lab 03/28/15 0431  WBC 9.3  HGB 12.3*  HCT 37.5*  PLT 195   ------------------------------------------------------------------------------------------------------------------  Chemistries   Recent Labs Lab 03/28/15 0431  NA 139  K 4.6  CL 103  CO2 30  GLUCOSE 107*  BUN 18  CREATININE 1.24  CALCIUM 8.7*   ------------------------------------------------------------------------------------------------------------------  Cardiac Enzymes  Recent Labs Lab 03/27/15 1251  TROPONINI <0.03   ------------------------------------------------------------------------------------------------------------------  RADIOLOGY:  Ct Angio Chest Pe W/cm &/or Wo Cm  03/27/2015  CLINICAL DATA:  Right-sided chest pain radiating under the right breast EXAM: CT ANGIOGRAPHY CHEST WITH CONTRAST TECHNIQUE: Multidetector CT imaging of the chest was performed using the standard protocol during bolus administration of intravenous contrast. Multiplanar CT image reconstructions and MIPs were obtained to evaluate the vascular anatomy. CONTRAST:  166mL OMNIPAQUE IOHEXOL 350 MG/ML SOLN COMPARISON:  None. FINDINGS: There is adequate opacification of the pulmonary arteries. There is pulmonary embolus in the right main pulmonary artery, right upper lobe lobar artery, left lower lobe and right lower lobe lobar artery and right middle lobe lobar pulmonary artery. The main pulmonary artery, right main pulmonary artery and left main pulmonary arteries are normal in size. The heart size is normal. There is no pericardial effusion. There is right middle lobe airspace  disease peripherally which may reflect atelectasis versus  pulmonary infarct. There is no pleural effusion or pneumothorax. Bilateral retroareolar fibroglandular tissue as can be seen with gynecomastia. There is no axillary, hilar, or mediastinal adenopathy. There is no lytic or blastic osseous lesion. The visualized portions of the upper abdomen are unremarkable. Review of the MIP images confirms the above findings. IMPRESSION: 1. Acute bilateral pulmonary emboli. Critical Value/emergent results were called by telephone at the time of interpretation on 03/27/2015 at 2:13 pm to Dr. Lenise Arena , who verbally acknowledged these results. Electronically Signed   By: Kathreen Devoid   On: 03/27/2015 14:16   US Venous Img Lower Unilateral Left  03/27/2015  CLINICAL DATA:  Left lower extremity pain and edema. History of knee surgery on 09/2014 with subsequent manipulation on 12/2014. History of pulmonary embolism. EXAM: LEFT LOWER EXTREMITY VENOUS DOPPLER ULTRASOUND TECHNIQUE: Gray-scale sonography with graded compression, as well as color Doppler and duplex ultrasound were performed to evaluate the lower extremity deep venous systems from the level of the common femoral vein and including the common femoral, femoral, profunda femoral, popliteal and calf veins including the posterior tibial, peroneal and gastrocnemius veins when visible. The superficial great saphenous vein was also interrogated. Spectral Doppler was utilized to evaluate flow at rest and with distal augmentation maneuvers in the common femoral, femoral and popliteal veins. COMPARISON:  Chest CTA - earlier same day FINDINGS: Contralateral Common Femoral Vein: Respiratory phasicity is normal and symmetric with the symptomatic side. No evidence of thrombus. Normal compressibility. Common Femoral Vein: No evidence of thrombus. Normal compressibility, respiratory phasicity and response to augmentation. Saphenofemoral Junction: No evidence of thrombus. Normal  compressibility and flow on color Doppler imaging. Profunda Femoral Vein: No evidence of thrombus. Normal compressibility and flow on color Doppler imaging. Femoral Vein: There is a minimal amount of hypoechoic occlusive thrombus within the distal aspect of the left femoral vein (represent images 14 and 30). Popliteal Vein: There is hypoechoic expansile thrombus throughout the imaged course of the left popliteal vein (image 15, 16, 31 and 34). Calf Veins: Suboptimally visualized. Superficial Great Saphenous Vein: No evidence of thrombus. Normal compressibility and flow on color Doppler imaging. Venous Reflux:  None. Other Findings:  None. IMPRESSION: The examination is positive for occlusive DVT within the distal aspect of the left femoral vein extending through the imaged course of the left popliteal vein. Electronically Signed   By: Sandi Mariscal M.D.   On: 03/27/2015 17:02    EKG:   Orders placed or performed during the hospital encounter of 03/27/15  . EKG 12-Lead  . EKG 12-Lead  . ED EKG within 10 minutes  . ED EKG within 10 minutes    ASSESSMENT AND PLAN:   * Acute bilateral pulmonary emboli continue heparin IV drip, fu/ echocardiogram.  * Acute respiratory failure with hypoxia and hypercapnia, due to pulmonary emboli and OSA,  Put on BIPAP this am.  NEB prn.  * Left leg DVT. continue heparin IV drip.  * Hypertension Continue metoprolol.  * Left ventricular hypertrophy Continue Lasix.  * Chest pain This is secondary to pulmonary emboli, continue Dilaudid as needed.    I discussed with Dr. Mortimer Fries. All the records are reviewed and case discussed with Care Management/Social Workerr. Management plans discussed with the patient, family and they are in agreement.  CODE STATUS: Full code.  TOTAL CRITICAL TIME TAKING CARE OF THIS PATIENT: 43 minutes.  Greater than 50% time was spent on coordination of care and face-to-face counseling.  POSSIBLE D/C IN 3 DAYS, DEPENDING  ON  CLINICAL CONDITION.   Demetrios Loll M.D on 03/28/2015 at 8:42 AM  Between 7am to 6pm - Pager - [phone]  After 6pm go to [email or web address] - password [email or web address] [facility]  [facility]  Office  [phone]  CC: Primary care physician; Webb Silversmith, NP

## 2015-03-29 ENCOUNTER — Encounter: Payer: Self-pay | Admitting: Radiology

## 2015-03-29 ENCOUNTER — Encounter
Admission: EM | Disposition: A | Discharge status: Home / Self Care | Payer: Self-pay | Source: Home / Self Care | Attending: Internal Medicine

## 2015-03-29 ENCOUNTER — Inpatient Hospital Stay: Payer: Managed Care, Other (non HMO)

## 2015-03-29 DIAGNOSIS — I82459 Acute embolism and thrombosis of unspecified peroneal vein: Secondary | ICD-10-CM

## 2015-03-29 DIAGNOSIS — R0902 Hypoxemia: Secondary | ICD-10-CM

## 2015-03-29 DIAGNOSIS — I2699 Other pulmonary embolism without acute cor pulmonale: Secondary | ICD-10-CM

## 2015-03-29 DIAGNOSIS — R609 Edema, unspecified: Secondary | ICD-10-CM

## 2015-03-29 DIAGNOSIS — I82493 Acute embolism and thrombosis of other specified deep vein of lower extremity, bilateral: Secondary | ICD-10-CM

## 2015-03-29 HISTORY — PX: PERIPHERAL VASCULAR CATHETERIZATION: SHX172C

## 2015-03-29 LAB — CBC
HCT: 36.3 % — ABNORMAL LOW (ref 40.0–52.0)
Hemoglobin: 11.9 g/dL — ABNORMAL LOW (ref 13.0–18.0)
MCH: 26.4 pg (ref 26.0–34.0)
MCHC: 32.9 g/dL (ref 32.0–36.0)
MCV: 80.3 fL (ref 80.0–100.0)
Platelets: 193 10*3/uL (ref 150–440)
RBC: 4.52 MIL/uL (ref 4.40–5.90)
RDW: 15.2 % — ABNORMAL HIGH (ref 11.5–14.5)
WBC: 9.4 10*3/uL (ref 3.8–10.6)

## 2015-03-29 LAB — LIPID PANEL
Cholesterol: 124 mg/dL (ref 0–200)
HDL: 30 mg/dL — ABNORMAL LOW (ref >40)
LDL Cholesterol: 80 mg/dL (ref 0–99)
Total CHOL/HDL Ratio: 4.1 RATIO
Triglycerides: 71 mg/dL (ref <150)
VLDL: 14 mg/dL (ref 0–40)

## 2015-03-29 LAB — BASIC METABOLIC PANEL
Anion gap: 7 (ref 5–15)
BUN: 21 mg/dL — ABNORMAL HIGH (ref 6–20)
CO2: 29 mmol/L (ref 22–32)
Calcium: 8.8 mg/dL — ABNORMAL LOW (ref 8.9–10.3)
Chloride: 101 mmol/L (ref 101–111)
Creatinine, Ser: 1 mg/dL (ref 0.61–1.24)
GFR calc Af Amer: >60 mL/min (ref >60)
GFR calc non Af Amer: >60 mL/min (ref >60)
Glucose, Bld: 98 mg/dL (ref 65–99)
Potassium: 4.3 mmol/L (ref 3.5–5.1)
Sodium: 137 mmol/L (ref 135–145)

## 2015-03-29 LAB — GLUCOSE, CAPILLARY
Glucose-Capillary: 112 mg/dL — ABNORMAL HIGH (ref 65–99)
Glucose-Capillary: 105 mg/dL — ABNORMAL HIGH (ref 65–99)
Glucose-Capillary: 144 mg/dL — ABNORMAL HIGH (ref 65–99)

## 2015-03-29 LAB — MAGNESIUM: Magnesium: 2 mg/dL (ref 1.7–2.4)

## 2015-03-29 LAB — TROPONIN I: Troponin I: <0.03 ng/mL (ref <0.031)

## 2015-03-29 SURGERY — IVC FILTER INSERTION
Anesthesia: Moderate Sedation

## 2015-03-29 MED ORDER — MIDAZOLAM HCL 2 MG/2ML IJ SOLN
INTRAMUSCULAR | Status: AC
  Filled 2015-03-29: qty 2

## 2015-03-29 MED ORDER — IOHEXOL 350 MG/ML SOLN
100.0000 mL | Freq: Once | INTRAVENOUS | Status: AC | PRN
  Administered 2015-03-29: 100 mL via INTRAVENOUS

## 2015-03-29 MED ORDER — HEPARIN SODIUM (PORCINE) 1000 UNIT/ML IJ SOLN
INTRAMUSCULAR | Status: DC | PRN
  Administered 2015-03-29: 4000 [IU] via INTRAVENOUS

## 2015-03-29 MED ORDER — AMIODARONE HCL IN DEXTROSE 360-4.14 MG/200ML-% IV SOLN
60.0000 mg/h | INTRAVENOUS | Status: DC
  Administered 2015-03-29: 60 mg/h via INTRAVENOUS
  Filled 2015-03-29 (×2): qty 200

## 2015-03-29 MED ORDER — CLINDAMYCIN PHOSPHATE 300 MG/50ML IV SOLN
300.0000 mg | Freq: Once | INTRAVENOUS | Status: AC
  Administered 2015-03-29: 300 mg via INTRAVENOUS
  Filled 2015-03-29: qty 50

## 2015-03-29 MED ORDER — HEPARIN (PORCINE) IN NACL 2-0.9 UNIT/ML-% IJ SOLN
INTRAMUSCULAR | Status: AC
  Filled 2015-03-29: qty 1000

## 2015-03-29 MED ORDER — KETOROLAC TROMETHAMINE 15 MG/ML IJ SOLN
15.0000 mg | Freq: Four times a day (QID) | INTRAMUSCULAR | Status: DC | PRN
  Administered 2015-03-29: 15 mg via INTRAVENOUS
  Filled 2015-03-29: qty 1

## 2015-03-29 MED ORDER — DILTIAZEM HCL 25 MG/5ML IV SOLN
5.0000 mg | Freq: Once | INTRAVENOUS | Status: AC
  Administered 2015-03-29: 5 mg via INTRAVENOUS
  Filled 2015-03-29: qty 5

## 2015-03-29 MED ORDER — ALTEPLASE 2 MG IJ SOLR
INTRAMUSCULAR | Status: DC | PRN
  Administered 2015-03-29: 8 mg

## 2015-03-29 MED ORDER — AMIODARONE IV BOLUS ONLY 150 MG/100ML
150.0000 mg | Freq: Once | INTRAVENOUS | Status: DC
Start: 2015-03-29 — End: 2015-03-29

## 2015-03-29 MED ORDER — HYDROMORPHONE HCL 1 MG/ML IJ SOLN
0.5000 mg | INTRAMUSCULAR | Status: DC | PRN
  Administered 2015-03-29: 1 mg via INTRAVENOUS
  Filled 2015-03-29 (×2): qty 1

## 2015-03-29 MED ORDER — HEPARIN SODIUM (PORCINE) 1000 UNIT/ML IJ SOLN
INTRAMUSCULAR | Status: AC
Start: 2015-03-29 — End: 2015-03-29
  Filled 2015-03-29: qty 1

## 2015-03-29 MED ORDER — ALTEPLASE 2 MG IJ SOLR
INTRAMUSCULAR | Status: AC
  Filled 2015-03-29: qty 8

## 2015-03-29 MED ORDER — MIDAZOLAM HCL 2 MG/2ML IJ SOLN
INTRAMUSCULAR | Status: DC | PRN
  Administered 2015-03-29 (×3): 2 mg via INTRAVENOUS

## 2015-03-29 MED ORDER — AMIODARONE HCL IN DEXTROSE 360-4.14 MG/200ML-% IV SOLN
30.0000 mg/h | INTRAVENOUS | Status: DC
  Administered 2015-03-30 (×3): 30 mg/h via INTRAVENOUS
  Filled 2015-03-29 (×6): qty 200

## 2015-03-29 MED ORDER — LIDOCAINE HCL (PF) 1 % IJ SOLN
INTRAMUSCULAR | Status: AC
  Filled 2015-03-29: qty 30

## 2015-03-29 MED ORDER — AMIODARONE LOAD VIA INFUSION
150.0000 mg | Freq: Once | INTRAVENOUS | Status: AC
  Administered 2015-03-29: 150 mg via INTRAVENOUS
  Filled 2015-03-29: qty 83.34

## 2015-03-29 MED ORDER — LIDOCAINE HCL 1 % IJ SOLN
INTRAMUSCULAR | Status: DC | PRN
  Administered 2015-03-29: 10 mL via INTRADERMAL

## 2015-03-29 MED ORDER — MIDAZOLAM HCL 5 MG/5ML IJ SOLN
INTRAMUSCULAR | Status: AC
  Filled 2015-03-29: qty 5

## 2015-03-29 MED ORDER — CLINDAMYCIN PHOSPHATE 300 MG/50ML IV SOLN
INTRAVENOUS | Status: AC
  Administered 2015-03-29: 300 mg
  Filled 2015-03-29: qty 50

## 2015-03-29 MED ORDER — AMIODARONE HCL 150 MG/3ML IV SOLN
INTRAVENOUS | Status: AC
  Filled 2015-03-29: qty 3

## 2015-03-29 MED ORDER — HEPARIN (PORCINE) IN NACL 100-0.45 UNIT/ML-% IJ SOLN
1950.0000 [IU]/h | INTRAMUSCULAR | Status: DC
  Administered 2015-03-29: 1750 [IU]/h via INTRAVENOUS
  Administered 2015-03-30 (×2): 1850 [IU]/h via INTRAVENOUS
  Filled 2015-03-29 (×5): qty 250

## 2015-03-29 MED ORDER — OXYCODONE-ACETAMINOPHEN 5-325 MG PO TABS
1.0000 | ORAL_TABLET | Freq: Four times a day (QID) | ORAL | Status: DC | PRN
  Administered 2015-03-31 – 2015-04-01 (×2): 1 via ORAL
  Filled 2015-03-29 (×2): qty 1

## 2015-03-29 SURGICAL SUPPLY — 13 items
CANISTER PENUMBRA MAX (MISCELLANEOUS) ×2 IMPLANT
CATH INDIGO 8 XTORQ TIP 115CM (CATHETERS) ×2 IMPLANT
CATH INFINITI 5FR ANG PIGTAIL (CATHETERS) ×2 IMPLANT
CATH VERT 100CM (CATHETERS) ×2 IMPLANT
FILTER VC CELECT-FEMORAL (Filter) ×2 IMPLANT
GLIDEWIRE ANGLED SS 035X260CM (WIRE) ×2 IMPLANT
PACK ANGIOGRAPHY (CUSTOM PROCEDURE TRAY) ×2 IMPLANT
SET INTRO CAPELLA COAXIAL (SET/KITS/TRAYS/PACK) ×2 IMPLANT
SHEATH BRITE TIP 6FRX11 (SHEATH) ×2 IMPLANT
SHEATH DESTINATION 8FR 90CM (SHEATH) ×2 IMPLANT
TUBING ASPIRATION INDIGO (MISCELLANEOUS) ×2 IMPLANT
WIRE J 3MM .035X145CM (WIRE) ×2 IMPLANT
WIRE MAGIC TOR.035 180C (WIRE) ×2 IMPLANT

## 2015-03-29 NOTE — Progress Notes (Signed)
ANTICOAGULATION CONSULT NOTE - Initial Consult  Pharmacy Consult for heparin drip Indication: pulmonary embolus  Allergies  Allergen Reactions  . Penicillins Other (See Comments)    Reaction:  Unknown     Patient Measurements: Height: 5\' 9"  (175.3 cm) Weight: (!) 303 lb 9.2 oz (137.7 kg) IBW/kg (Calculated) : 70.7 Heparin Dosing Weight: 103 kg  Vital Signs: Temp: 97.8 F (36.6 C) (03/08 1638) Temp Source: Oral (03/08 1500) BP: 116/83 mmHg (03/08 1900) Pulse Rate: 80 (03/08 1900)  Labs:  Recent Labs  03/27/15 1251 03/27/15 2105 03/28/15 0431 03/29/15 0434 03/29/15 1044  HGB 13.8  --  12.3* 11.9*  --   HCT 42.7  --  37.5* 36.3*  --   PLT 240  --  195 193  --   LABPROT 14.7  --   --   --   --   INR 1.13  --   --   --   --   HEPARINUNFRC  --  <0.10* 0.34  --   --   CREATININE 0.94  --  1.24 1.00  --   TROPONINI <0.03  --   --   --  <0.03    Estimated Creatinine Clearance: 116.5 mL/min (by C-G formula based on Cr of 1).   Medical History: Past Medical History  Diagnosis Date  . Obesity   . Hypertensive heart disease   . Chest pain     a. 2015 Abnl MV;  b. 2015 Cath: nl cors.  . Chronic diastolic CHF (congestive heart failure) ([place])     a. 03/2015 Echo: EF 55-65%, poor windows, mildly dil RV.  Marland Kitchen GERD (gastroesophageal reflux disease)   . Arthritis   . Urine incontinence   . Primary localized osteoarthritis of right knee     a. 02/2014 s/p R Partial Knee arthroplasty.  . Primary localized osteoarthritis of left knee     a. 11/2014 s/p L TKA.  Marland Kitchen OSA (obstructive sleep apnea)     a. uses CPAP nightly  . Stiffness of left knee     a. 12/2014 s/p manipulation under anesthesia.  Marland Kitchen DVT (deep venous thrombosis) ([place])     a. 03/2015 U/S: occlusive DVT w/in the distal aspect of the Left fem vein through the L popliteal vein.  . Bilateral pulmonary embolism ([place])     a. 03/2015 CTA: acute bilat PE.    Medications:  Scheduled:  . antiseptic oral rinse  7 mL Mouth  Rinse BID  . aspirin EC  81 mg Oral Daily  . clindamycin      . HYDROmorphone   Intravenous 6 times per day  . ipratropium-albuterol  3 mL Nebulization Q6H  . metoprolol succinate  12.5 mg Oral BID  . pantoprazole  40 mg Oral Daily  . senna-docusate  1 tablet Oral BID   Infusions:  . amiodarone 60 mg/hr (03/29/15 1522)   Followed by  . amiodarone    . heparin      Assessment: Pharmacy consulted to initiate heparin drip in a 55 yo male with pulmonary embolism.  Patient previously receiving apixaban 10 mg po BID for PE treatment.  Last dose of apixaban 10 mg today at 1056.  Est CrCl~115 mL/min, Baseline aPTT and HL pending  Goal of Therapy:  HL of 0.3-0.7 APTT: 66-102 s Monitor CBC daily   Plan:  No bolus per MD request and dose of apixaban.  Will initiate patient on 1750 units/hr to start ~12 hours after last apixaban dose at 2300.  Will order an aPTT in 6 hours at 0500 as would anticipate that HL will be elevated due to recent apixaban use. Obtain HL daily until correlates with aPTT values. CBC in AM.   Joeziah Voit G 03/29/2015,7:57 PM

## 2015-03-29 NOTE — Progress Notes (Signed)
Pt converted back to SR with hr in 80s while in specials. Dr. Philmore Pali aware, per dr order second amio bolus dc'd

## 2015-03-29 NOTE — Progress Notes (Signed)
PT Cancellation Note  Patient Details Name: Richard [name] MRN: [id number] DOB: [birth year]   Cancelled Treatment:    Reason Eval/Treat Not Completed: Medical issues which prohibited therapy. PT reviewed patient's chart, patient noted to have recently transitioned to a-fib with HRs in the 120s to 140s at rest. Given his clinical picture, mobility is inappropriate at this time. PT will re-attempt mobility evaluation at a later time/date as appropriate. PT will defer until further recommendations from cardiology team.  Kerman Passey, PT, DPT    03/29/2015, 1:23 PM

## 2015-03-29 NOTE — Op Note (Signed)
[facility]  Percutaneous Study/Intervention Procedural Note   Date of Surgery: 03/29/2015,6:26 PM  Surgeon:Ancelmo Hunt, Dolores Lory   Pre-operative Diagnosis: Massive pulmonary embolism with cardiac instability; new onset atrial fibrillation; hypoxia  Post-operative diagnosis:  Same  Procedure(s) Performed:  1.  Selective injection right pulmonary artery  2.  Infusion of 8 mg TPA right pulmonary artery  3.  Mechanical thrombectomy with the Indigo CAT8 right pulmonary artery  4.  Insertion infrarenal inferior vena cava filter Celect type   5.  Ultrasound-guided access to the right common femoral vein   Anesthesia: Conscious sedation was administered under my direct supervision. IV Versed plus fentanyl were utilized. Continuous ECG, pulse oximetry and blood pressure was monitored throughout the entire procedure. A total of 6  milligrams of Versed.  Sheath: 8 French right common femoral vein   Contrast: 80  cc   Fluoroscopy Time: 16.3  minutes  Indications:  Patient presented 2 days ago with bilateral pulmonary emboli but has deteriorated and has now been transferred to the ICU. He is demonstrating increasing hypoxia and new onset atrial fibrillation. Follow-up CT scan demonstrates a fairly massive blood clot in the right pulmonary artery and scattered blood clot in the left pulmonary artery. Some benefits for angiography with the hope for thrombectomy both with TPA as well as the indigo device was described in detail all questions answered patient agreed to proceed. Was also discussed placement of an IVC filter. Patient has agreed to proceed.    Procedure:  Richard [name] a 55 y.o. male who was identified and appropriate procedural time out was performed.  The patient was then placed supine on the table and prepped and draped in the usual sterile fashion.  Ultrasound was used to evaluate the right common femoral vein.  It was patent .  A digital ultrasound image was  acquired.  A micropuncture needle was used to access the right  common femoralvein  under direct ultrasound guidance and a permanent image wassaved for the record.microwire was then advanced under fluoroscopic guidance followed by micro-sheath.  A 0.035 J wire was advanced without resistance and a 6Fr sheath was placed.    Wire was then advanced into the superior vena cava and a pigtail catheter with a 145 angle was negotiated through the right ventricle and into the pulmonary artery. Angiography was then performed. Subsequently the catheter and wire were negotiated into the right main pulmonary artery and again angiography was performed identifying a large PE. Wire was reintroduced and an 8 [place] catheter was advanced over the wire and positioned with its tip in the mid to distal portion of the right main pulmonary artery. TPA was then infused over approximately 20 minutes. After letting the TPA dwell an adequate amount of time the indigo CAT8 device was advanced through the catheter and positioned with its tip at the large thrombus. Subsequently the aspiration mode was engaged 3 separate and distinct passes were made repositioning the catheter several times during each pass. Follow-up imaging was performed between the passes. Passes were continued as long as there appeared to be significant reduction in clot burden. After 3 times we appeared to have achieved a significant improvement but were not removing significantly more thrombus and I elected to terminate the mechanical thrombectomy.  The catheter was then removed over wire and the sheath was removed the IVC filter delivery sheath and dilator were advanced over the wire positioned in the distal IVC bolus injection of contrast was used localizing the  renal blushes at L1 catheter was then repositioned to the top of L2 and a Celect filter was deployed in excellent orientation without difficulty.   Sheath was then removed and pressure was held for 15  minutes safeguard device was applied. There are no immediate complications.   Findings:   Right pulmonary artery: Large thrombus is noted in the distal right main pulmonary artery. It is obstructing all of the branches to the 3 lobes. Following TPA infusion with subsequent mechanical thrombectomy the upper and middle lobes appear to be mostly free of thrombus there is still a moderate sized thrombus noted in the right lower lobe artery. This represents a significant improvement.  IVC is free of thrombus measuring 24 mm in diameter renal blushes or at the L1 level and therefore the filter is deployed at L2 without difficulty in excellent orientation.   Disposition: Patient was taken to the recovery room in stable condition having tolerated the procedure well.  Richard [name], Dolores Lory 03/29/2015,6:26 PM

## 2015-03-29 NOTE — Progress Notes (Signed)
[facility] Progress Note Patient Name: Richard [name] DOB: 09/18/1960 MRN: [id number]   Date of Service  03/29/2015  HPI/Events of Note  Camera check on patient post partial thrombectomy intravascular as well as IVC filter placement. Patient laying in bed on high flow nasal cannula oxygen. Resting comfortably watching TV. Hemodynamically stable. Reports some very mild right-sided chest discomfort. Denies any increased work of breathing.  eICU Interventions  Continue close monitoring in the intensive care unit.     Intervention Category Major Interventions: Respiratory failure - evaluation and management;Other:  Tera Partridge 03/29/2015, 10:17 PM

## 2015-03-29 NOTE — Consult Note (Signed)
ANTICOAGULATION CONSULT NOTE - Initial Consult  Pharmacy Consult for Eliquis Indication: pulmonary embolus  Allergies  Allergen Reactions  . Penicillins Other (See Comments)    Reaction:  Unknown     Patient Measurements: Height: 5\' 9"  (175.3 cm) Weight: (!) 303 lb 9.2 oz (137.7 kg) IBW/kg (Calculated) : 70.7  Vital Signs: Temp: 98.6 F (37 C) (03/08 0730) Temp Source: Oral (03/08 0730) BP: 122/65 mmHg (03/08 1100) Pulse Rate: 79 (03/08 1100)  Labs:  Recent Labs  03/27/15 1251 03/27/15 2105 03/28/15 0431 03/29/15 0434 03/29/15 1044  HGB 13.8  --  12.3* 11.9*  --   HCT 42.7  --  37.5* 36.3*  --   PLT 240  --  195 193  --   LABPROT 14.7  --   --   --   --   INR 1.13  --   --   --   --   HEPARINUNFRC  --  <0.10* 0.34  --   --   CREATININE 0.94  --  1.24 1.00  --   TROPONINI <0.03  --   --   --  <0.03    Estimated Creatinine Clearance: 116.5 mL/min (by C-G formula based on Cr of 1).   Medical History: Past Medical History  Diagnosis Date  . Obesity   . Hypertension 08/17/2011  . Chest pain 08/16/2011    MI ruled out.  Marland Kitchen LVH (left ventricular hypertrophy) 08/17/2011    Per 2-D echocardiogram.  . GERD (gastroesophageal reflux disease) 08/17/2011  . Arthritis   . Urine incontinence   . Primary localized osteoarthritis of right knee 02/25/2014  . Dysrhythmia     TACHY  . Primary localized osteoarthritis of left knee 12/06/2014  . OSA (obstructive sleep apnea)     uses CPAP nightly  . Stiffness of left knee 01/20/2015    Medications:  Scheduled:  . antiseptic oral rinse  7 mL Mouth Rinse BID  . apixaban  10 mg Oral BID   Followed by  . [START ON 04/04/2015] apixaban  5 mg Oral BID  . aspirin EC  81 mg Oral Daily  . HYDROmorphone   Intravenous 6 times per day  . ipratropium-albuterol  3 mL Nebulization Q6H  . metoprolol succinate  12.5 mg Oral BID  . pantoprazole  40 mg Oral Daily  . senna-docusate  1 tablet Oral BID   Assessment: Richard [name] is a 55 yo male  admitted for PE. Pharmacy consulted to dose eliquis after heparin gtt D/C'd.    Plan:  Continue apixaban 10mg  PO BID x7 days followed by 5mg  PO BID.  Pharmacy will continue to monitor for changes in renal function that would require dose adjustment.  Richard [name] D 03/29/2015,1:04 PM

## 2015-03-29 NOTE — Consult Note (Signed)
[facility] Consult Note  MRN : [id number]  Richard [name] is a 55 y.o. (25-Nov-1960) male who presents with chief complaint of  Chief Complaint  Patient presents with  . Chest Pain  .  History of Present Illness: I am asked to evaluate the patient by Dr. Syble Creek for worsening PE and DVT The patient is a 55 y.o. male with a known history of hypertension, chest pain, left ventricular hypertrophic, urinary incontinence, obstructive sleep apnea on CPAP at night. For last few days he has been feeling more short of breath and tired, but two days ago he experienced the abrupt onset of pain in the right side of the chest.  He described the pain as sharp and stabbing and he states he cannot take deep breath.  CT angiogram was obtained and noted to have bilateral pulmonary emboli.  In the ER he required IV pain medication to control the pain, also he was  hypoxic and required oxygen. He denies any past history of blood clots, he had bilateral knee surgeries done in the last year and he was given a short course of oral anticoagulation after the surgery as a prevention.  This morning he experienced the abrupt onset of increased shortness of breath and some increase in chest pain he required increasing oxygen therapy can maintain appropriate saturations and therefore was transferred to the ICU where he was noted to be in atrial fibrillation with rapid ventricular response area.  Current Facility-Administered Medications  Medication Dose Route Frequency Provider Last Rate Last Dose  . acetaminophen (TYLENOL) tablet 650 mg  650 mg Oral Q6H PRN Demetrios Loll, MD   650 mg at 03/28/15 1652  . amiodarone (NEXTERONE PREMIX) 360 MG/200ML (1.8 mg/mL) IV infusion  60 mg/hr Intravenous Continuous Minna Merritts, MD 33.3 mL/hr at 03/29/15 1522 60 mg/hr at 03/29/15 1522   Followed by  . amiodarone (NEXTERONE PREMIX) 360 MG/200ML (1.8 mg/mL) IV infusion  30 mg/hr Intravenous Continuous  Minna Merritts, MD      . antiseptic oral rinse (CPC / CETYLPYRIDINIUM CHLORIDE 0.05%) solution 7 mL  7 mL Mouth Rinse BID Demetrios Loll, MD   7 mL at 03/29/15 1000  . apixaban (ELIQUIS) tablet 10 mg  10 mg Oral BID Charlett Nose, RPH   10 mg at 03/29/15 1056   Followed by  . [START ON 04/04/2015] apixaban (ELIQUIS) tablet 5 mg  5 mg Oral BID Charlett Nose, [facility]      . aspirin EC tablet 81 mg  81 mg Oral Daily Vaughan Basta, MD   81 mg at 03/29/15 1055  . baclofen (LIORESAL) tablet 10 mg  10 mg Oral TID PRN Vaughan Basta, MD      . clindamycin (CLEOCIN) IVPB 300 mg  300 mg Intravenous Once Katha Cabal, MD      . diphenhydrAMINE (BENADRYL) injection 12.5 mg  12.5 mg Intravenous Q6H PRN Vaughan Basta, MD       Or  . diphenhydrAMINE (BENADRYL) 12.5 MG/5ML elixir 12.5 mg  12.5 mg Oral Q6H PRN Vaughan Basta, MD      . furosemide (LASIX) tablet 40 mg  40 mg Oral Daily PRN Vaughan Basta, MD      . HYDROmorphone (DILAUDID) 1 mg/mL PCA injection   Intravenous 6 times per day Vaughan Basta, MD   0.75 mg at 03/29/15 1200  . HYDROmorphone (DILAUDID) injection 0.5-1 mg  0.5-1 mg Intravenous Q4H PRN Anders Simmonds, MD      .  ipratropium-albuterol (DUONEB) 0.5-2.5 (3) MG/3ML nebulizer solution 3 mL  3 mL Nebulization Q6H Flora Lipps, MD   3 mL at 03/29/15 1430  . metoprolol (LOPRESSOR) injection 5 mg  5 mg Intravenous Q6H PRN Saundra Shelling, MD      . metoprolol succinate (TOPROL-XL) 24 hr tablet 12.5 mg  12.5 mg Oral BID Vaughan Basta, MD   12.5 mg at 03/29/15 1056  . naloxone (NARCAN) injection 0.4 mg  0.4 mg Intravenous PRN Vaughan Basta, MD       And  . sodium chloride flush (NS) 0.9 % injection 9 mL  9 mL Intravenous PRN Vaughan Basta, MD      . ondansetron (ZOFRAN) injection 4 mg  4 mg Intravenous Q6H PRN Vaughan Basta, MD      . oxyCODONE-acetaminophen (PERCOCET/ROXICET) 5-325 MG per tablet 1-2 tablet  1-2 tablet  Oral Q6H PRN Flora Lipps, MD      . pantoprazole (PROTONIX) EC tablet 40 mg  40 mg Oral Daily Vaughan Basta, MD   40 mg at 03/29/15 1055  . senna-docusate (Senokot-S) tablet 1 tablet  1 tablet Oral BID Flora Lipps, MD   1 tablet at 03/28/15 2229    Past Medical History  Diagnosis Date  . Obesity   . Hypertension 08/17/2011  . Chest pain 08/16/2011    MI ruled out.  Marland Kitchen LVH (left ventricular hypertrophy) 08/17/2011    Per 2-D echocardiogram.  . GERD (gastroesophageal reflux disease) 08/17/2011  . Arthritis   . Urine incontinence   . Primary localized osteoarthritis of right knee 02/25/2014  . Dysrhythmia     TACHY  . Primary localized osteoarthritis of left knee 12/06/2014  . OSA (obstructive sleep apnea)     uses CPAP nightly  . Stiffness of left knee 01/20/2015    Past Surgical History  Procedure Laterality Date  . Appendectomy    . Orthopedic surgery Left     arthroscopy x3  . Left heart catheterization with coronary angiogram N/A 01/12/2014    Procedure: LEFT HEART CATHETERIZATION WITH CORONARY ANGIOGRAM;  Surgeon: Jettie Booze, MD;  Location: [facility] CATH LAB;  Service: Cardiovascular;  Laterality: N/A;  . Partial knee arthroplasty Right 02/25/2014    Procedure: RIGHT KNEE ARTHROPLASTY CONDYLE AND PLATEAU MEDIAL COMPARTMENT ;  Surgeon: Johnny Bridge, MD;  Location: [place];  Service: Orthopedics;  Laterality: Right;  . Joint replacement    . Total knee arthroplasty  12/06/2014    Procedure: TOTAL KNEE ARTHROPLASTY;  Surgeon: Marchia Bond, MD;  Location: [place];  Service: Orthopedics;;  . Knee closed reduction Left 01/20/2015    Procedure: LEFT KNEE MANIPULATION;  Surgeon: Marchia Bond, MD;  Location: [place];  Service: Orthopedics;  Laterality: Left;    Social History Social History  Substance Use Topics  . Smoking status: Never Smoker   . Smokeless tobacco: Never Used  . Alcohol Use: Yes     Comment: occassional    Family  History Family History  Problem Relation Age of Onset  . Coronary artery disease Mother   . Hyperlipidemia Mother   . Hypertension Mother   . Arthritis Mother   . Coronary artery disease Father   . Heart disease Paternal Grandmother   . Alzheimer's disease Paternal Grandfather    no family history of porphyria, autoimmune disease or bleeding clotting disorders  Allergies  Allergen Reactions  . Penicillins Other (See Comments)    Reaction:  Unknown      REVIEW OF SYSTEMS (  Negative unless checked)  Constitutional: [] Weight loss  [] Fever  [] Chills Cardiac: [] Chest pain   [] Chest pressure   [] Palpitations   [] Shortness of breath when laying flat   [] Shortness of breath at rest   [] Shortness of breath with exertion. Vascular:  [] Pain in legs with walking   [x] Pain in legs at rest   [] Pain in legs when laying flat   [] Claudication   [] Pain in feet when walking  [] Pain in feet at rest  [] Pain in feet when laying flat   [] History of DVT   [] Phlebitis   [x] Swelling in legs   [] Varicose veins   [] Non-healing ulcers Pulmonary:   [] Uses home oxygen   [] Productive cough   [] Hemoptysis   [] Wheeze  [] COPD   [] Asthma Neurologic:  [] Dizziness  [] Blackouts   [] Seizures   [] History of stroke   [] History of TIA  [] Aphasia   [] Temporary blindness   [] Dysphagia   [] Weakness or numbness in arms   [] Weakness or numbness in legs Musculoskeletal:  [] Arthritis   [] Joint swelling   [] Joint pain   [] Low back pain Hematologic:  [] Easy bruising  [] Easy bleeding   [] Hypercoagulable state   [] Anemic  [] Hepatitis Gastrointestinal:  [] Blood in stool   [] Vomiting blood  [] Gastroesophageal reflux/heartburn   [] Difficulty swallowing. Genitourinary:  [] Chronic kidney disease   [] Difficult urination  [] Frequent urination  [] Burning with urination   [] Blood in urine Skin:  [] Rashes   [] Ulcers   [] Wounds Psychological:  [] History of anxiety   []  History of major depression.  Physical Examination  Filed Vitals:   03/29/15  1230 03/29/15 1300 03/29/15 1400 03/29/15 1500  BP:  107/73 117/92 118/81  Pulse: 133 97 133 164  Temp:    98.4 F (36.9 C)  TempSrc:    Oral  Resp: 17 19 18 20   Height:      Weight:      SpO2: 96% 95% 95% 96%   Body mass index is 44.81 kg/(m^2).  Head: [place]/AT, No temporalis wasting. Prominent temp pulse not noted. Ear/Nose/Throat: Nares w/o erythema or drainage, oropharynx w/o obsrtuction, Mallampati score: Class III.  Dentition good.  Eyes: PERRLA, Sclera nonicteric.  Neck: Supple, no nuchal rigidity.  No bruit or JVD.  Pulmonary:  Breath sounds equal bilaterally, no use of accessory muscles.  Cardiac: RRR, normal S1, S2, no Murmurs, rubs or gallops. Vascular: Pedal pulses palpable left leg with 3+ pitting edema Gastrointestinal: soft, non-tender, non-distended.  Musculoskeletal: Moves all extremities.  No deformity or atrophy.  Neurologic: CN 2-12 intact. Symmetrical.  Speech is fluent.  Psychiatric: Judgment intact, Mood & affect appropriate for pt's clinical situation. Dermatologic: No rashes or ulcers noted.  No cellulitis or open wounds. Lymph : No Cervical,  or Inguinal lymphadenopathy.      CBC Lab Results  Component Value Date   WBC 9.4 03/29/2015   HGB 11.9* 03/29/2015   HCT 36.3* 03/29/2015   MCV 80.3 03/29/2015   PLT 193 03/29/2015    BMET    Component Value Date/Time   NA 137 03/29/2015 0434   K 4.3 03/29/2015 0434   CL 101 03/29/2015 0434   CO2 29 03/29/2015 0434   GLUCOSE 98 03/29/2015 0434   BUN 21* 03/29/2015 0434   CREATININE 1.00 03/29/2015 0434   CALCIUM 8.8* 03/29/2015 0434   GFRNONAA >60 03/29/2015 0434   GFRAA >60 03/29/2015 0434   Estimated Creatinine Clearance: 116.5 mL/min (by C-G formula based on Cr of 1).  COAG Lab Results  Component Value Date  INR 1.13 03/27/2015   INR 1.1* 01/11/2014   INR 1.00 08/15/2011    Assessment/Plan 1.   Acute bilateral pulmonary emboli          Continue on heparin IV drip, will continue that  for now.          Monitor on telemetry, get echocardiogram.          Given the acute change will repeat the CT and place a filter possible thrombolysis  2. Acute hypoxic respiratory failure.      Due to pulmonary emboli, continue supplemental oxygen for now.  3. Acute Atrial fibrillation       Now on Amiodarone  4.  Swelling on left leg and has arthritis.        Get ultrasound Doppler study on the left lower extremity.  5.   Hypertension           Continue metoprolol.  6.   Left ventricular hypertrophy             Continue Lasix.    Nhung Danko, Dolores Lory, MD  03/29/2015 4:08 PM

## 2015-03-29 NOTE — Progress Notes (Signed)
Night of surgery note  Patient now denies lateral pleuritic-type chest pain he does note some pain more central but this is not as intense as his previous chest pain.  Vital signs stable O2 saturations 100% on high flow nasal cannula Groin clean dry and intact Left leg marked edema 2+ pitting  Submassive PE with extensive left leg DVT  Patient has undergone successful thrombectomy right pulmonary embolism and placement of a filter he will stay in bed for the rest of the night and will begin mobilization tomorrow. He is now on heparin and we will transition back to Eliquis once we see how well he is tolerating his left leg DVT. Should his left leg be a big problem particularly when he tries to walk then thrombolysis could be performed in the next week or so.

## 2015-03-29 NOTE — Progress Notes (Signed)
Pt transitioned to a fib with hr of 120-140 and sustaining, prime dr made aware, cardiology consult ordered

## 2015-03-29 NOTE — Consult Note (Signed)
Cardiology Consult    Patient ID: Richard [name] MRN: [id number], DOB/AGE: 54/27/1962   Admit date: 03/27/2015 Date of Consult: 03/29/2015  Primary Physician: Webb Silversmith, NP Primary Cardiologist: Lendell Caprice, MD  Requesting Provider: Doylene Canning, MD  Patient Profile    55 y/o ? with a h/o abnl stress test and nl cors on cath, HTN, LVH/diast dysfxn, obesity, and OA s/p bilat Knee surgeries who was admitted with right sided c/p and dyspnea, found to have bilat PE and LLE DVT, and developed AF RVR today.  Past Medical History   Past Medical History  Diagnosis Date  . Obesity   . Hypertensive heart disease   . Chest pain     a. 2015 Abnl MV;  b. 2015 Cath: nl cors.  . Chronic diastolic CHF (congestive heart failure) ([place])     a. 03/2015 Echo: EF 55-65%, poor windows, mildly dil RV.  Marland Kitchen GERD (gastroesophageal reflux disease)   . Arthritis   . Urine incontinence   . Primary localized osteoarthritis of right knee     a. 02/2014 s/p R Partial Knee arthroplasty.  . Primary localized osteoarthritis of left knee     a. 11/2014 s/p L TKA.  Marland Kitchen OSA (obstructive sleep apnea)     a. uses CPAP nightly  . Stiffness of left knee     a. 12/2014 s/p manipulation under anesthesia.  Marland Kitchen DVT (deep venous thrombosis) ([place])     a. 03/2015 U/S: occlusive DVT w/in the distal aspect of the Left fem vein through the L popliteal vein.  . Bilateral pulmonary embolism ([place])     a. 03/2015 CTA: acute bilat PE.    Past Surgical History  Procedure Laterality Date  . Appendectomy    . Orthopedic surgery Left     arthroscopy x3  . Left heart catheterization with coronary angiogram N/A 01/12/2014    Procedure: LEFT HEART CATHETERIZATION WITH CORONARY ANGIOGRAM;  Surgeon: Jettie Booze, MD;  Location: [facility] CATH LAB;  Service: Cardiovascular;  Laterality: N/A;  . Partial knee arthroplasty Right 02/25/2014    Procedure: RIGHT KNEE ARTHROPLASTY CONDYLE AND PLATEAU MEDIAL COMPARTMENT ;  Surgeon: Johnny Bridge,  MD;  Location: [place];  Service: Orthopedics;  Laterality: Right;  . Joint replacement    . Total knee arthroplasty  12/06/2014    Procedure: TOTAL KNEE ARTHROPLASTY;  Surgeon: Marchia Bond, MD;  Location: [place];  Service: Orthopedics;;  . Knee closed reduction Left 01/20/2015    Procedure: LEFT KNEE MANIPULATION;  Surgeon: Marchia Bond, MD;  Location: [place];  Service: Orthopedics;  Laterality: Left;     Allergies  Allergies  Allergen Reactions  . Penicillins Other (See Comments)    Reaction:  Unknown     History of Present Illness    55 y/o ? with a h/o HTN, diast dysfxn, obesity, OSA, and OA s/p R partial knee arthroplasty in 02/2014 followed by L TKA in 11/2014.  He also has a h/o abnl stress test in 2015 with f/u cath revealing nl cors.  Following his L TKA in 11/2014, he was initially doing well but following a routine f/u Xray of his knee, he began having pain, swelling, and reduced mobility of his left knee.  Despite PT, reduced mobility persisted and in late Dec he underwent ortho manipulation of the L knee under anesthesia.  Following that, he resumed PT and has been improving but has remained relatively sedentary.  In Feb, he developed the flu  and was laid up in bed for several days.  He says that since then, he has had DOE with minimal activity.  On 3/6, he was @ [place] and had sudden onset of sharp, right sided chest pain that was worse with deep breathing.  He was also dyspneic.  This persisted while driving home and he instead drove himself to the [facility] ED.  Here, CTA was performed and revealed acute bilat PE.  He was initially placed on heparin and admitted for further eval.  LE U/S revealed LLE DVT.  Echo showed nl LV fxn and mildly dil RV.    On the evening of 3/6 he cont to c/o right chest pain and a PCA pump was ordered.  He was initially stable on 4 lpm bu then dropped saturations into the 80's and he was placed on bipap @ 45% in order to  maintain O2 sats.  There is a nsg note from 5am on 3/7 reporting irregular tachycardia, though no ecg was performed.  He was tx to the ICU on 3/7 and subsequently transitioned from heparin to eliquis 10 bid on 3/7.  He did reasonably well the remainder of 3/7.  On the afternoon of 3/8, he developed increased dyspnea and AFib with RVR.  He was placed on IV amio with initial improvement in rate and subsequent conversion to sinus rhythm.  Repeat CT was performed and vascular surgery was consulted.  Decision has been made to place an IVC filter and provide lytics.  We've been asked to eval. He remains on NRB but is in no acute distress and denies dyspnea @ rest though he does continue to have c/p with deep breathing.  He is currently maintaining sinus rhythm.  Inpatient Medications    . amiodarone  150 mg Intravenous Once  . antiseptic oral rinse  7 mL Mouth Rinse BID  . apixaban  10 mg Oral BID   Followed by  . [START ON 04/04/2015] apixaban  5 mg Oral BID  . aspirin EC  81 mg Oral Daily  . clindamycin      . clindamycin (CLEOCIN) IV  300 mg Intravenous Once  . HYDROmorphone   Intravenous 6 times per day  . ipratropium-albuterol  3 mL Nebulization Q6H  . metoprolol succinate  12.5 mg Oral BID  . pantoprazole  40 mg Oral Daily  . senna-docusate  1 tablet Oral BID    Family History    Family History  Problem Relation Age of Onset  . Coronary artery disease Mother   . Hyperlipidemia Mother   . Hypertension Mother   . Arthritis Mother   . Coronary artery disease Father   . Heart disease Paternal Grandmother   . Alzheimer's disease Paternal Grandfather     Social History    Social History   Social History  . Marital Status: Married    Spouse Name: N/A  . Number of Children: N/A  . Years of Education: N/A   Occupational History  . tiler    Social History Main Topics  . Smoking status: Never Smoker   . Smokeless tobacco: Never Used  . Alcohol Use: Yes     Comment: occassional    . Drug Use: No  . Sexual Activity: Yes   Other Topics Concern  . Not on file   Social History Narrative     Review of Systems    General:  No chills, fever, night sweats or weight changes.  Cardiovascular:  +++ right sided chest pain and  dyspnea on exertion, +++ LLE/knee edema, no orthopnea, palpitations, paroxysmal nocturnal dyspnea. Dermatological: No rash, lesions/masses Respiratory: No cough, +++ dyspnea Urologic: No hematuria, dysuria Abdominal:   No nausea, vomiting, diarrhea, bright red blood per rectum, melena, or hematemesis Neurologic:  No visual changes, wkns, changes in mental status. All other systems reviewed and are otherwise negative except as noted above.  Physical Exam    Blood pressure 118/81, pulse 164, temperature 98.4 F (36.9 C), temperature source Oral, resp. rate 20, height 5\' 9"  (1.753 m), weight 303 lb 9.2 oz (137.7 kg), SpO2 96 %.  General: Pleasant, NAD Psych: Normal affect. Neuro: Alert and oriented X 3. Moves all extremities spontaneously. HEENT: Normal  Neck: Supple without bruits or JVD. Lungs:  Resp regular and unlabored, diminished breath sounds bilat - limited effort b/c it hurts the right side of his chest. Heart: RRR no s3, s4, or murmurs. Abdomen: Obese, soft, non-tender, non-distended, BS + x 4.  Extremities: No clubbing, cyanosis.  Left knee and calf/ankle are swollen but non-pitting. DP/PT/Radials 2+ and equal bilaterally.  Labs     Recent Labs  03/27/15 1251 03/29/15 1044  TROPONINI <0.03 <0.03   Lab Results  Component Value Date   WBC 9.4 03/29/2015   HGB 11.9* 03/29/2015   HCT 36.3* 03/29/2015   MCV 80.3 03/29/2015   PLT 193 03/29/2015    Recent Labs Lab 03/29/15 0434  NA 137  K 4.3  CL 101  CO2 29  BUN 21*  CREATININE 1.00  CALCIUM 8.8*  GLUCOSE 98   Lab Results  Component Value Date   CHOL 124 03/29/2015   HDL 30* 03/29/2015   LDLCALC 80 03/29/2015   TRIG 71 03/29/2015    Radiology Studies    Ct  Angio Chest Pe W/cm &/or Wo Cm  03/27/2015  CLINICAL DATA:  Right-sided chest pain radiating under the right breast EXAM: CT ANGIOGRAPHY CHEST WITH CONTRAST TECHNIQUE: Multidetector CT imaging of the chest was performed using the standard protocol during bolus administration of intravenous contrast. Multiplanar CT image reconstructions and MIPs were obtained to evaluate the vascular anatomy. CONTRAST:  141mL OMNIPAQUE IOHEXOL 350 MG/ML SOLN COMPARISON:  None. FINDINGS: There is adequate opacification of the pulmonary arteries. There is pulmonary embolus in the right main pulmonary artery, right upper lobe lobar artery, left lower lobe and right lower lobe lobar artery and right middle lobe lobar pulmonary artery. The main pulmonary artery, right main pulmonary artery and left main pulmonary arteries are normal in size. The heart size is normal. There is no pericardial effusion. There is right middle lobe airspace disease peripherally which may reflect atelectasis versus pulmonary infarct. There is no pleural effusion or pneumothorax. Bilateral retroareolar fibroglandular tissue as can be seen with gynecomastia. There is no axillary, hilar, or mediastinal adenopathy. There is no lytic or blastic osseous lesion. The visualized portions of the upper abdomen are unremarkable. Review of the MIP images confirms the above findings. IMPRESSION: 1. Acute bilateral pulmonary emboli. Critical Value/emergent results were called by telephone at the time of interpretation on 03/27/2015 at 2:13 pm to Dr. Lenise Arena , who verbally acknowledged these results. Electronically Signed   By: Kathreen Devoid   On: 03/27/2015 14:16   US Venous Img Lower Unilateral Left  03/27/2015  CLINICAL DATA:  Left lower extremity pain and edema. History of knee surgery on 09/2014 with subsequent manipulation on 12/2014. History of pulmonary embolism. EXAM: LEFT LOWER EXTREMITY VENOUS DOPPLER ULTRASOUND TECHNIQUE: Gray-scale sonography with graded  compression,  as well as color Doppler and duplex ultrasound were performed to evaluate the lower extremity deep venous systems from the level of the common femoral vein and including the common femoral, femoral, profunda femoral, popliteal and calf veins including the posterior tibial, peroneal and gastrocnemius veins when visible. The superficial great saphenous vein was also interrogated. Spectral Doppler was utilized to evaluate flow at rest and with distal augmentation maneuvers in the common femoral, femoral and popliteal veins. COMPARISON:  Chest CTA - earlier same day FINDINGS: Contralateral Common Femoral Vein: Respiratory phasicity is normal and symmetric with the symptomatic side. No evidence of thrombus. Normal compressibility. Common Femoral Vein: No evidence of thrombus. Normal compressibility, respiratory phasicity and response to augmentation. Saphenofemoral Junction: No evidence of thrombus. Normal compressibility and flow on color Doppler imaging. Profunda Femoral Vein: No evidence of thrombus. Normal compressibility and flow on color Doppler imaging. Femoral Vein: There is a minimal amount of hypoechoic occlusive thrombus within the distal aspect of the left femoral vein (represent images 14 and 30). Popliteal Vein: There is hypoechoic expansile thrombus throughout the imaged course of the left popliteal vein (image 15, 16, 31 and 34). Calf Veins: Suboptimally visualized. Superficial Great Saphenous Vein: No evidence of thrombus. Normal compressibility and flow on color Doppler imaging. Venous Reflux:  None. Other Findings:  None. IMPRESSION: The examination is positive for occlusive DVT within the distal aspect of the left femoral vein extending through the imaged course of the left popliteal vein. Electronically Signed   By: Sandi Mariscal M.D.   On: 03/27/2015 17:02   CT Angio of the Chest with Contrast 3.8.2017  IMPRESSION: No change in the appearance of bilateral pulmonary emboli.  Negative for right heart strain.  Possible small pulmonary infarct right middle lobe is unchanged.  New trace right pleural effusion.  Fatty infiltration of the liver.  ECG & Cardiac Imaging    3/6 - RSR, 93, LAD, LAFB - no acute ST/T changes.  3/8 - ECG from today is not available for review at this time - not scanned into Epic and pt is now undergoing IVC filter placement.  2D Echocardiogram 3.6.2017  Study Conclusions   - Procedure narrative: Transthoracic echocardiography. Image   quality was suboptimal. The study was technically difficult, as a   result of poor acoustic windows, poor sound wave transmission,   and body habitus. - Left ventricle: Systolic function was normal. The estimated   ejection fraction was in the range of 55% to 65%. - Aortic valve: Valve area (Vmax): 3.3 cm^2. - Right ventricle: The cavity size was mildly dilated. _____________   Assessment & Plan    1.  Acute Bilateral PE/Left LE DVT: S/P L TKA in November with subsequent swelling and limited mobility s/p manipulation by ortho under anesthesia in late December. Worsening dyspnea today in the setting of AF RVR.  He has been eval by vascular surgery and repeat CT performed. -IVC filter and thrombolysis per vascular surgery. -Eliquis 10 mg BID x 7 days, followed by 5 mg bid.  2.  Afib RVR:   In setting of #1. IV amio started earlier and he has since converted to sinus rhythm. CHA2DS2VASc =  1  . K, Mg wnl.  TSH nl in 12/2013  repeat. -Cont IV amio for now.  -Cont Eliquis (PE treatment dosing). -Cont oral  blocker, which he was on @ home.   -Likely does not require long term anticoagulation for Afib alone.  3.  Acute respiratory failure: In setting of #  1. No acute distress @ this time. -Per IM/CCM.  4.  Chronic Diastolic CHF: Nl LV fxn by echo this admission. Prior h/o LVH/Diast dysfxn. Will be @ risk for volume overload in setting of AF/Tachycardia but currently euvolemic and now  back in sinus. -Cont  blocker.  He only uses lasix prn @ home.  5.  Hypertensive Heart Disease: Stable.  6.  OSA: Uses CPAP.  7.  Morbid Obesity: Would benefit from dietary counseling.   Activity has been limited over the past several years 2/2 knee pain with bilat knee surgeries. -Cont PT as outpt.  Signed, Murray Hodgkins, NP 03/29/2015, 4:29 PM

## 2015-03-29 NOTE — Consult Note (Signed)
[facility] Consultation      Date: 03/29/2015,   MRN# [id number] Richard [name] Mar 12, 1960 Code Status:     Code Status Orders        Start     Ordered   03/27/15 1728  Full code   Continuous     03/27/15 1727    Code Status History    Date Active Date Inactive Code Status Order ID Comments User Context   12/06/2014  1:32 PM 12/09/2014  3:04 PM Full Code [id number]  Marchia Bond, MD Inpatient   02/25/2014  7:08 PM 02/26/2014  1:02 PM Full Code [id number]  Johnny Bridge, MD Inpatient   01/12/2014  9:32 AM 01/12/2014  2:58 PM Full Code [id number]  Jettie Booze, MD Inpatient   08/16/2011  1:45 AM 08/17/2011  4:01 PM Full Code [id number]  Marlane Hatcher, RN Inpatient     Hosp day:@LENGTHOFSTAYDAYS @ Referring MD: @ATDPROV @     PCP:      AdmissionWeight: 299 lb (135.626 kg)                 CurrentWeight: (!) 303 lb 9.2 oz (137.7 kg) Richard [name] is a 55 y.o. old male seen in consultation for PE at the request of Dr. Bridgett Larsson     CHIEF COMPLAINT:   Acute SOB   HISTORY OF PRESENT ILLNESS  Patient remains on high flow [place] 40% fio2, seems to be comfortable Patient with acute afib with RVR,plan for repeat CT chest to assess clot burden Vasc surgery consulted      MEDICATIONS    Home Medication:  No current outpatient prescriptions on file.  Current Medication:  Current facility-administered medications:  .  acetaminophen (TYLENOL) tablet 650 mg, 650 mg, Oral, Q6H PRN, Demetrios Loll, MD, 650 mg at 03/28/15 1652 .  [COMPLETED] amiodarone (NEXTERONE) 1.8 mg/mL load via infusion 150 mg, 150 mg, Intravenous, Once, 150 mg at 03/29/15 1510 **FOLLOWED BY** amiodarone (NEXTERONE PREMIX) 360 MG/200ML (1.8 mg/mL) IV infusion, 60 mg/hr, Intravenous, Continuous, Last Rate: 33.3 mL/hr at 03/29/15 1522, 60 mg/hr at 03/29/15 1522 **FOLLOWED BY** amiodarone (NEXTERONE PREMIX) 360 MG/200ML (1.8 mg/mL) IV infusion, 30 mg/hr, Intravenous, Continuous, Minna Merritts, MD .  antiseptic oral rinse (CPC / CETYLPYRIDINIUM CHLORIDE 0.05%) solution 7 mL, 7 mL, Mouth Rinse, BID, Demetrios Loll, MD, 7 mL at 03/29/15 1000 .  apixaban (ELIQUIS) tablet 10 mg, 10 mg, Oral, BID, 10 mg at 03/29/15 1056 **FOLLOWED BY** [START ON 04/04/2015] apixaban (ELIQUIS) tablet 5 mg, 5 mg, Oral, BID, Charlett Nose, RPH .  aspirin EC tablet 81 mg, 81 mg, Oral, Daily, Vaughan Basta, MD, 81 mg at 03/29/15 1055 .  baclofen (LIORESAL) tablet 10 mg, 10 mg, Oral, TID PRN, Vaughan Basta, MD .  diphenhydrAMINE (BENADRYL) injection 12.5 mg, 12.5 mg, Intravenous, Q6H PRN **OR** diphenhydrAMINE (BENADRYL) 12.5 MG/5ML elixir 12.5 mg, 12.5 mg, Oral, Q6H PRN, Vaughan Basta, MD .  furosemide (LASIX) tablet 40 mg, 40 mg, Oral, Daily PRN, Vaughan Basta, MD .  HYDROmorphone (DILAUDID) 1 mg/mL PCA injection, , Intravenous, 6 times per day, Vaughan Basta, MD, 0.75 mg at 03/29/15 1200 .  HYDROmorphone (DILAUDID) injection 0.5-1 mg, 0.5-1 mg, Intravenous, Q4H PRN, Anders Simmonds, MD .  ipratropium-albuterol (DUONEB) 0.5-2.5 (3) MG/3ML nebulizer solution 3 mL, 3 mL, Nebulization, Q6H, Flora Lipps, MD, 3 mL at 03/29/15 1430 .  metoprolol (LOPRESSOR) injection 5 mg, 5 mg, Intravenous, Q6H PRN, Reatha Harps Pyreddy, MD .  metoprolol succinate (TOPROL-XL) 24  hr tablet 12.5 mg, 12.5 mg, Oral, BID, Vaughan Basta, MD, 12.5 mg at 03/29/15 1056 .  naloxone (NARCAN) injection 0.4 mg, 0.4 mg, Intravenous, PRN **AND** sodium chloride flush (NS) 0.9 % injection 9 mL, 9 mL, Intravenous, PRN, Vaughan Basta, MD .  ondansetron (ZOFRAN) injection 4 mg, 4 mg, Intravenous, Q6H PRN, Vaughan Basta, MD .  oxyCODONE-acetaminophen (PERCOCET/ROXICET) 5-325 MG per tablet 1-2 tablet, 1-2 tablet, Oral, Q6H PRN, Flora Lipps, MD .  pantoprazole (PROTONIX) EC tablet 40 mg, 40 mg, Oral, Daily, Vaughan Basta, MD, 40 mg at 03/29/15 1055 .  senna-docusate (Senokot-S) tablet 1  tablet, 1 tablet, Oral, BID, Flora Lipps, MD, 1 tablet at 03/28/15 2229    ALLERGIES   Penicillins     REVIEW OF SYSTEMS   Review of Systems  Constitutional: Negative for fever, chills, weight loss and malaise/fatigue.  HENT: Negative for congestion and hearing loss.   Eyes: Negative for blurred vision and double vision.  Respiratory: Positive for shortness of breath. Negative for cough, hemoptysis, sputum production and wheezing.   Cardiovascular: Positive for leg swelling. Negative for chest pain, palpitations and orthopnea.  Gastrointestinal: Negative for heartburn, nausea, vomiting and abdominal pain.  Genitourinary: Negative for dysuria and urgency.  Musculoskeletal: Positive for joint pain. Negative for myalgias.  Skin: Negative for rash.  Neurological: Negative for dizziness, tingling, tremors and headaches.  Endo/Heme/Allergies: Does not bruise/bleed easily.  Psychiatric/Behavioral: The patient is not nervous/anxious.   All other systems reviewed and are negative.    VS: BP 118/81 mmHg  Pulse 164  Temp(Src) 98.4 F (36.9 C) (Oral)  Resp 20  Ht 5\' 9"  (1.753 m)  Wt 303 lb 9.2 oz (137.7 kg)  BMI 44.81 kg/m2  SpO2 96%     PHYSICAL EXAM  Physical Exam  Constitutional: He is oriented to person, place, and time. He appears well-developed and well-nourished. No distress.  HENT:  Head: Normocephalic and atraumatic.  Mouth/Throat: No oropharyngeal exudate.  Eyes: EOM are normal. Pupils are equal, round, and reactive to light. No scleral icterus.  Neck: Normal range of motion. Neck supple.  Cardiovascular: Normal rate, regular rhythm and normal heart sounds.   No murmur heard. Pulmonary/Chest: No stridor. No respiratory distress. He has no wheezes.  Abdominal: Soft. Bowel sounds are normal. He exhibits no distension. There is no tenderness. There is no rebound.  Musculoskeletal: Normal range of motion. He exhibits no edema.  Left leg swelling  Neurological: He is  alert and oriented to person, place, and time. Coordination normal.  Skin: Skin is warm. He is not diaphoretic.  Psychiatric: He has a normal mood and affect.        LABS    Recent Labs     03/27/15  1251  03/28/15  0431  03/29/15  0434  HGB  13.8  12.3*  11.9*  HCT  42.7  37.5*  36.3*  MCV  81.0  81.8  80.3  WBC  9.9  9.3  9.4  BUN  17  18  21*  CREATININE  0.94  1.24  1.00  GLUCOSE  86  107*  98  CALCIUM  9.1  8.7*  8.8*  INR  1.13   --    --   ,      IMAGING    Ct Angio Chest Pe W/cm &/or Wo Cm  03/27/2015  CLINICAL DATA:  Right-sided chest pain radiating under the right breast EXAM: CT ANGIOGRAPHY CHEST WITH CONTRAST TECHNIQUE: Multidetector CT imaging of the chest was  performed using the standard protocol during bolus administration of intravenous contrast. Multiplanar CT image reconstructions and MIPs were obtained to evaluate the vascular anatomy. CONTRAST:  144mL OMNIPAQUE IOHEXOL 350 MG/ML SOLN COMPARISON:  None. FINDINGS: There is adequate opacification of the pulmonary arteries. There is pulmonary embolus in the right main pulmonary artery, right upper lobe lobar artery, left lower lobe and right lower lobe lobar artery and right middle lobe lobar pulmonary artery. The main pulmonary artery, right main pulmonary artery and left main pulmonary arteries are normal in size. The heart size is normal. There is no pericardial effusion. There is right middle lobe airspace disease peripherally which may reflect atelectasis versus pulmonary infarct. There is no pleural effusion or pneumothorax. Bilateral retroareolar fibroglandular tissue as can be seen with gynecomastia. There is no axillary, hilar, or mediastinal adenopathy. There is no lytic or blastic osseous lesion. The visualized portions of the upper abdomen are unremarkable. Review of the MIP images confirms the above findings. IMPRESSION: 1. Acute bilateral pulmonary emboli. Critical Value/emergent results were called by  telephone at the time of interpretation on 03/27/2015 at 2:13 pm to Dr. Lenise Arena , who verbally acknowledged these results. Electronically Signed   By: Kathreen Devoid   On: 03/27/2015 14:16   US Venous Img Lower Unilateral Left  03/27/2015  CLINICAL DATA:  Left lower extremity pain and edema. History of knee surgery on 09/2014 with subsequent manipulation on 12/2014. History of pulmonary embolism. EXAM: LEFT LOWER EXTREMITY VENOUS DOPPLER ULTRASOUND TECHNIQUE: Gray-scale sonography with graded compression, as well as color Doppler and duplex ultrasound were performed to evaluate the lower extremity deep venous systems from the level of the common femoral vein and including the common femoral, femoral, profunda femoral, popliteal and calf veins including the posterior tibial, peroneal and gastrocnemius veins when visible. The superficial great saphenous vein was also interrogated. Spectral Doppler was utilized to evaluate flow at rest and with distal augmentation maneuvers in the common femoral, femoral and popliteal veins. COMPARISON:  Chest CTA - earlier same day FINDINGS: Contralateral Common Femoral Vein: Respiratory phasicity is normal and symmetric with the symptomatic side. No evidence of thrombus. Normal compressibility. Common Femoral Vein: No evidence of thrombus. Normal compressibility, respiratory phasicity and response to augmentation. Saphenofemoral Junction: No evidence of thrombus. Normal compressibility and flow on color Doppler imaging. Profunda Femoral Vein: No evidence of thrombus. Normal compressibility and flow on color Doppler imaging. Femoral Vein: There is a minimal amount of hypoechoic occlusive thrombus within the distal aspect of the left femoral vein (represent images 14 and 30). Popliteal Vein: There is hypoechoic expansile thrombus throughout the imaged course of the left popliteal vein (image 15, 16, 31 and 34). Calf Veins: Suboptimally visualized. Superficial Great Saphenous  Vein: No evidence of thrombus. Normal compressibility and flow on color Doppler imaging. Venous Reflux:  None. Other Findings:  None. IMPRESSION: The examination is positive for occlusive DVT within the distal aspect of the left femoral vein extending through the imaged course of the left popliteal vein. Electronically Signed   By: Sandi Mariscal M.D.   On: 03/27/2015 17:02         ASSESSMENT/PLAN   55 yo obese white male with acute SOB from acute b/L PE with acute DVT from immobilization with underlying OSA  1.oxygen as needed 2.biPAP adn CPAP for OSA 3.started oral anticoagulant 4.pateint with afib with RVR worrisome for worsening clot burden -plan for Repeat CT chest and either directed TPA or systemic TPA-vasc surgery consulted 5.afib with  RVR-started on amiodarone    I have personally obtained a history, examined the patient, evaluated laboratory and independently reviewed imaging results, formulated the assessment and plan and placed orders.  The Patient requires high complexity decision making for assessment and support, frequent evaluation and titration of therapies, application of advanced monitoring technologies and extensive interpretation of multiple databases.   Patient are satisfied with Plan of action and management. All questions answered  Corrin Parker, M.D.  [facility]  Medical Director [place] Director [facility] Cardio-Pulmonary Department

## 2015-03-29 NOTE — Progress Notes (Signed)
[place] [facility] [place] NAME: Richard [name]    MR#:  [id number]  DATE OF BIRTH:  May 03, 1960  SUBJECTIVE:  CHIEF COMPLAINT:   Chief Complaint  Patient presents with  . Chest Pain  chest pain on right side while breathing. Respiratory distress, on HFNC oxygen, today mornign converted to A fib and having RVR up to 150.  REVIEW OF SYSTEMS:  CONSTITUTIONAL: No fever, has weakness.  EYES: No blurred or double vision.  EARS, NOSE, AND THROAT: No tinnitus or ear pain.  RESPIRATORY: has cough, shortness of breath, no wheezing or hemoptysis.  CARDIOVASCULAR: has right-sided chest pain, no orthopnea,  Left leg edema.  GASTROINTESTINAL: No nausea, vomiting, diarrhea or abdominal pain.  GENITOURINARY: No dysuria, hematuria.  ENDOCRINE: No polyuria, nocturia,  HEMATOLOGY: No anemia, easy bruising or bleeding SKIN: No rash or lesion. MUSCULOSKELETAL: No joint pain or arthritis.   NEUROLOGIC: No tingling, numbness, weakness.  PSYCHIATRY: No anxiety or depression.   DRUG ALLERGIES:   Allergies  Allergen Reactions  . Penicillins Other (See Comments)    Reaction:  Unknown     VITALS:  Blood pressure 118/81, pulse 164, temperature 98.4 F (36.9 C), temperature source Oral, resp. rate 20, height 5\' 9"  (1.753 m), weight 137.7 kg (303 lb 9.2 oz), SpO2 96 %.  PHYSICAL EXAMINATION:  GENERAL:  55 y.o.-year-old patient lying in the bed with no acute distress. Obese. EYES: Pupils equal, round, reactive to light and accommodation. No scleral icterus. Extraocular muscles intact.  HEENT: Head atraumatic, normocephalic. Oropharynx and nasopharynx clear.  NECK:  Supple, no jugular venous distention. No thyroid enlargement, no tenderness.  LUNGS: Normal breath sounds bilaterally, no wheezing, rales,rhonchi or crepitation. Positive  use of accessory muscles of respiration. On HFNC. CARDIOVASCULAR: S1, S2 fast and irregular. No murmurs, rubs, or gallops.   ABDOMEN: Soft, nontender, nondistended. Bowel sounds present. No organomegaly or mass.  EXTREMITIES: trace edema on left leg, no cyanosis, or clubbing.  NEUROLOGIC: Cranial nerves II through XII are intact. Muscle strength 5/5 in all extremities. Sensation intact. Gait not checked.  PSYCHIATRIC: The patient is alert and oriented x 3.  SKIN: No obvious rash, lesion, or ulcer.    LABORATORY PANEL:   CBC  Recent Labs Lab 03/29/15 0434  WBC 9.4  HGB 11.9*  HCT 36.3*  PLT 193   ------------------------------------------------------------------------------------------------------------------  Chemistries   Recent Labs Lab 03/29/15 0434  NA 137  K 4.3  CL 101  CO2 29  GLUCOSE 98  BUN 21*  CREATININE 1.00  CALCIUM 8.8*  MG 2.0   ------------------------------------------------------------------------------------------------------------------  Cardiac Enzymes  Recent Labs Lab 03/29/15 1044  TROPONINI <0.03   ------------------------------------------------------------------------------------------------------------------  RADIOLOGY:  US Venous Img Lower Unilateral Left  03/27/2015  CLINICAL DATA:  Left lower extremity pain and edema. History of knee surgery on 09/2014 with subsequent manipulation on 12/2014. History of pulmonary embolism. EXAM: LEFT LOWER EXTREMITY VENOUS DOPPLER ULTRASOUND TECHNIQUE: Gray-scale sonography with graded compression, as well as color Doppler and duplex ultrasound were performed to evaluate the lower extremity deep venous systems from the level of the common femoral vein and including the common femoral, femoral, profunda femoral, popliteal and calf veins including the posterior tibial, peroneal and gastrocnemius veins when visible. The superficial great saphenous vein was also interrogated. Spectral Doppler was utilized to evaluate flow at rest and with distal augmentation maneuvers in the common femoral, femoral and popliteal veins. COMPARISON:   Chest CTA - earlier same day FINDINGS: Contralateral Common  Femoral Vein: Respiratory phasicity is normal and symmetric with the symptomatic side. No evidence of thrombus. Normal compressibility. Common Femoral Vein: No evidence of thrombus. Normal compressibility, respiratory phasicity and response to augmentation. Saphenofemoral Junction: No evidence of thrombus. Normal compressibility and flow on color Doppler imaging. Profunda Femoral Vein: No evidence of thrombus. Normal compressibility and flow on color Doppler imaging. Femoral Vein: There is a minimal amount of hypoechoic occlusive thrombus within the distal aspect of the left femoral vein (represent images 14 and 30). Popliteal Vein: There is hypoechoic expansile thrombus throughout the imaged course of the left popliteal vein (image 15, 16, 31 and 34). Calf Veins: Suboptimally visualized. Superficial Great Saphenous Vein: No evidence of thrombus. Normal compressibility and flow on color Doppler imaging. Venous Reflux:  None. Other Findings:  None. IMPRESSION: The examination is positive for occlusive DVT within the distal aspect of the left femoral vein extending through the imaged course of the left popliteal vein. Electronically Signed   By: Sandi Mariscal M.D.   On: 03/27/2015 17:02    EKG:   Orders placed or performed during the hospital encounter of 03/27/15  . EKG 12-Lead  . EKG 12-Lead  . ED EKG within 10 minutes  . ED EKG within 10 minutes    ASSESSMENT AND PLAN:   * Acute bilateral pulmonary emboli Given heparin IV drip, fu/ echocardiogram.   switced to eliquis.  * Acute respiratory failure with hypoxia and hypercapnia, due to pulmonary emboli and OSA,  Put on BIPAP this am.  NEB prn.  now on HFNC- this happened 1 day after admission- may be had another blood clot?   I spoke to Cardio and Vascular- and suggested to get a repeat CT angio to check for clot burden.   Depending on that vascular will decide about thrombolytics. Or  IVC filter  * A fib with RVR   Called urgent cardio consult- started on amio drip.  * Left leg DVT. On eliquis.  * Hypertension Continue metoprolol.  * Left ventricular hypertrophy Continue Lasix.  * Chest pain This is secondary to pulmonary emboli, continue Dilaudid PCA.    I discussed with Dr. Mortimer Fries, Dr. Rockey Situ, Dr. Carmin Richmond All the records are reviewed and case discussed with Care Management/Social Workerr. Management plans discussed with the patient, family and they are in agreement.  CODE STATUS: Full code.  TOTAL CRITICAL TIME TAKING CARE OF THIS PATIENT: 60 minutes.  Greater than 50% time was spent on coordination of care and face-to-face counseling.  POSSIBLE D/C IN 3 DAYS, DEPENDING ON CLINICAL CONDITION.   Vaughan Basta M.D on 03/29/2015 at 3:48 PM  Between 7am to 6pm - Pager - [phone]  After 6pm go to [email or web address] - password [email or web address]  [facility]  Office  [phone]  CC: Primary care physician; Webb Silversmith, NP

## 2015-03-29 NOTE — Progress Notes (Signed)
Pt remains in afib, hr 120-140, prime dr assessed, see mar for intervention

## 2015-03-29 NOTE — Progress Notes (Signed)
Patient recovered in ICU 13 for 30 mins.  Alert, talkative on arrival to ICU.  Report to Roselyn Reef, Day nurse with night nurse in attendance at bedside.Check right groin for bleeding or hematoma.  Patient will be on bedrest for 4 hours post sheath pull---out of bed at 22:30.  Bilateral pulses are 1's DP's.

## 2015-03-29 NOTE — Progress Notes (Addendum)
[place] Progress Note Patient Name: Richard [name] DOB: [birth year] MRN: [id number]   Date of Service  03/29/2015  HPI/Events of Note  Pleural pain related to PE. Nurse afraid to give too much opioid. Creatinine = 1.24.  eICU Interventions  Will order Toradol 15 mg IV Q 6 hours PRN pain. Follow Creatinine closely while on Toradol.     Intervention Category Intermediate Interventions: Pain - evaluation and management  Corrinna Karapetyan Eugene 03/29/2015, 12:23 AM

## 2015-03-30 ENCOUNTER — Encounter: Payer: Self-pay | Admitting: Vascular Surgery

## 2015-03-30 DIAGNOSIS — I5032 Chronic diastolic (congestive) heart failure: Secondary | ICD-10-CM

## 2015-03-30 DIAGNOSIS — I480 Paroxysmal atrial fibrillation: Secondary | ICD-10-CM

## 2015-03-30 DIAGNOSIS — I519 Heart disease, unspecified: Secondary | ICD-10-CM

## 2015-03-30 DIAGNOSIS — J9690 Respiratory failure, unspecified, unspecified whether with hypoxia or hypercapnia: Secondary | ICD-10-CM

## 2015-03-30 DIAGNOSIS — G4733 Obstructive sleep apnea (adult) (pediatric): Secondary | ICD-10-CM

## 2015-03-30 DIAGNOSIS — I4891 Unspecified atrial fibrillation: Secondary | ICD-10-CM | POA: Insufficient documentation

## 2015-03-30 DIAGNOSIS — R079 Chest pain, unspecified: Secondary | ICD-10-CM

## 2015-03-30 DIAGNOSIS — R609 Edema, unspecified: Secondary | ICD-10-CM

## 2015-03-30 DIAGNOSIS — I2699 Other pulmonary embolism without acute cor pulmonale: Principal | ICD-10-CM

## 2015-03-30 LAB — GLUCOSE, CAPILLARY
Glucose-Capillary: 175 mg/dL — ABNORMAL HIGH (ref 65–99)
Glucose-Capillary: 187 mg/dL — ABNORMAL HIGH (ref 65–99)

## 2015-03-30 LAB — BASIC METABOLIC PANEL
Anion gap: 7 (ref 5–15)
BUN: 16 mg/dL (ref 6–20)
CO2: 29 mmol/L (ref 22–32)
Calcium: 8.5 mg/dL — ABNORMAL LOW (ref 8.9–10.3)
Chloride: 99 mmol/L — ABNORMAL LOW (ref 101–111)
Creatinine, Ser: 0.93 mg/dL (ref 0.61–1.24)
GFR calc Af Amer: >60 mL/min (ref >60)
GFR calc non Af Amer: >60 mL/min (ref >60)
Glucose, Bld: 116 mg/dL — ABNORMAL HIGH (ref 65–99)
Potassium: 3.9 mmol/L (ref 3.5–5.1)
Sodium: 135 mmol/L (ref 135–145)

## 2015-03-30 LAB — CBC
HCT: 36.3 % — ABNORMAL LOW (ref 40.0–52.0)
Hemoglobin: 12.1 g/dL — ABNORMAL LOW (ref 13.0–18.0)
MCH: 26.5 pg (ref 26.0–34.0)
MCHC: 33.2 g/dL (ref 32.0–36.0)
MCV: 79.7 fL — ABNORMAL LOW (ref 80.0–100.0)
Platelets: 191 10*3/uL (ref 150–440)
RBC: 4.55 MIL/uL (ref 4.40–5.90)
RDW: 15.5 % — ABNORMAL HIGH (ref 11.5–14.5)
WBC: 8.5 10*3/uL (ref 3.8–10.6)

## 2015-03-30 LAB — TROPONIN I
Troponin I: 0.03 ng/mL (ref <0.031)
Troponin I: 0.03 ng/mL (ref <0.031)

## 2015-03-30 LAB — TSH: TSH: 1.741 u[IU]/mL (ref 0.350–4.500)

## 2015-03-30 LAB — APTT
aPTT: 50 seconds — ABNORMAL HIGH (ref 24–36)
aPTT: 50 seconds — ABNORMAL HIGH (ref 24–36)
aPTT: 80 seconds — ABNORMAL HIGH (ref 24–36)
aPTT: 55 seconds — ABNORMAL HIGH (ref 24–36)

## 2015-03-30 LAB — HEPARIN LEVEL (UNFRACTIONATED)
Heparin Unfractionated: 2.42 IU/mL — ABNORMAL HIGH (ref 0.30–0.70)
Heparin Unfractionated: 2 IU/mL — ABNORMAL HIGH (ref 0.30–0.70)

## 2015-03-30 MED ORDER — SENNOSIDES-DOCUSATE SODIUM 8.6-50 MG PO TABS
2.0000 | ORAL_TABLET | Freq: Two times a day (BID) | ORAL | Status: DC
  Administered 2015-03-31 – 2015-04-03 (×7): 2 via ORAL
  Filled 2015-03-30 (×7): qty 2

## 2015-03-30 MED ORDER — SENNOSIDES-DOCUSATE SODIUM 8.6-50 MG PO TABS: 1.0000 | ORAL_TABLET | Freq: Two times a day (BID) | ORAL | Status: DC

## 2015-03-30 MED ORDER — CETYLPYRIDINIUM CHLORIDE 0.05 % MT LIQD: 7.0000 mL | Freq: Two times a day (BID) | OROMUCOSAL | Status: DC

## 2015-03-30 MED ORDER — OXYCODONE HCL 5 MG PO TABS
5.0000 mg | ORAL_TABLET | Freq: Four times a day (QID) | ORAL | Status: DC
  Administered 2015-03-30 – 2015-04-03 (×14): 5 mg via ORAL
  Filled 2015-03-30 (×15): qty 1

## 2015-03-30 MED ORDER — AMIODARONE IV BOLUS ONLY 150 MG/100ML
150.0000 mg | Freq: Once | INTRAVENOUS | Status: AC
  Administered 2015-03-30: 150 mg via INTRAVENOUS

## 2015-03-30 NOTE — Care Management (Signed)
Barrier to discharge: Thrombolecomty and IV Cardizem within last 24 hours.  Anticipate transfer out of icu within 24 hours.  Patient will be given Eliquis coupons at discharge

## 2015-03-30 NOTE — Progress Notes (Signed)
Pt has continued to have episodes of Afib, HR 130s. Dr. Leonidas Romberg notified. No new orders given at this time. Will continue to monitor. Pt sating well on 6LNC.

## 2015-03-30 NOTE — Progress Notes (Signed)
[place] [facility] [employer] NAME: Ramzey Fedak    MR#:  [id number]  DATE OF BIRTH:  1960/07/06  SUBJECTIVE:  CHIEF COMPLAINT:   Chief Complaint  Patient presents with  . Chest Pain  chest pain on right side while breathing. Respiratory distress, was on HFNC oxygen,on 03/29/15 mornign converted to A fib and having RVR up to 150. Later Pulmonary thrombolysis was done by vascular, and IVC filter is placed on 03/29/15.  Now he is much better and HR under control, on 4-5 ltr nasal canula oxygen.  REVIEW OF SYSTEMS:  CONSTITUTIONAL: No fever, has weakness.  EYES: No blurred or double vision.  EARS, NOSE, AND THROAT: No tinnitus or ear pain.  RESPIRATORY: has cough, shortness of breath, no wheezing or hemoptysis.  CARDIOVASCULAR: has right-sided chest pain, no orthopnea,  Left leg edema.  GASTROINTESTINAL: No nausea, vomiting, diarrhea or abdominal pain.  GENITOURINARY: No dysuria, hematuria.  ENDOCRINE: No polyuria, nocturia. HEMATOLOGY: No anemia, easy bruising or bleeding. SKIN: No rash or lesion. MUSCULOSKELETAL: No joint pain or arthritis.   NEUROLOGIC: No tingling, numbness, weakness.  PSYCHIATRY: No anxiety or depression.   DRUG ALLERGIES:   Allergies  Allergen Reactions  . Penicillins Other (See Comments)    Reaction:  Unknown     VITALS:  Blood pressure 130/72, pulse 72, temperature 98.1 F (36.7 C), temperature source Oral, resp. rate 14, height 5\' 9"  (1.753 m), weight 137.7 kg (303 lb 9.2 oz), SpO2 100 %.  PHYSICAL EXAMINATION:  GENERAL:  55 y.o.-year-old patient lying in the bed with no acute distress. Obese. EYES: Pupils equal, round, reactive to light and accommodation. No scleral icterus. Extraocular muscles intact.  HEENT: Head atraumatic, normocephalic. Oropharynx and nasopharynx clear.  NECK:  Supple, no jugular venous distention. No thyroid enlargement, no tenderness.  LUNGS: Normal breath sounds bilaterally, no  wheezing, rales,rhonchi or crepitation. No use of accessory muscles of respiration. On  [place]. CARDIOVASCULAR: S1, S2 regular. No murmurs, rubs, or gallops.  ABDOMEN: Soft, nontender, nondistended. Bowel sounds present. No organomegaly or mass.  EXTREMITIES: trace edema on left leg, no cyanosis, or clubbing. Left leg swollen. NEUROLOGIC: Cranial nerves II through XII are intact. Muscle strength 5/5 in all extremities. Sensation intact. Gait not checked.  PSYCHIATRIC: The patient is alert and oriented x 3.   SKIN: No obvious rash, lesion, or ulcer.    LABORATORY PANEL:   CBC  Recent Labs Lab 03/30/15 0459  WBC 8.5  HGB 12.1*  HCT 36.3*  PLT 191   ------------------------------------------------------------------------------------------------------------------  Chemistries   Recent Labs Lab 03/29/15 0434 03/30/15 0459  NA 137 135  K 4.3 3.9  CL 101 99*  CO2 29 29  GLUCOSE 98 116*  BUN 21* 16  CREATININE 1.00 0.93  CALCIUM 8.8* 8.5*  MG 2.0  --    ------------------------------------------------------------------------------------------------------------------  Cardiac Enzymes  Recent Labs Lab 03/30/15 0034  TROPONINI 0.03  0.03   ------------------------------------------------------------------------------------------------------------------  RADIOLOGY:  Ct Angio Chest Pe W/cm &/or Wo Cm  03/29/2015  CLINICAL DATA:  Acute onset shortness of breath 2 days ago with right chest pain with a diagnosis of pulmonary embolus at that time. Shortness of breath worsened this morning. Question increased pulmonary embolus burden. Initial encounter. EXAM: CT ANGIOGRAPHY CHEST WITH CONTRAST TECHNIQUE: Multidetector CT imaging of the chest was performed using the standard protocol during bolus administration of intravenous contrast. Multiplanar CT image reconstructions and MIPs were obtained to evaluate the vascular anatomy. CONTRAST:  100 ml  OMNIPAQUE IOHEXOL 350 MG/ML SOLN  COMPARISON:  CTA chest 03/27/2015. FINDINGS: Bilateral pulmonary emboli are unchanged. The RV to LV ratio is 0.84 which is negative for right heart strain. The patient has a very small right pleural effusion which is new since the prior study. No left pleural effusion is noted. Small mediastinal lymph nodes including a 1.2 cm prevascular node on image 46 and a 1.0 cm right paratracheal node on image 47 are unchanged. The lungs demonstrate scattered atelectatic change. Peripheral opacity in the right middle lobe which could be due to atelectasis or possibly infarct is unchanged in appearance. The lungs are otherwise unremarkable. Visualized upper abdomen demonstrates fatty infiltration of the liver. Imaged intra-abdominal contents are otherwise unremarkable. The no focal bony abnormality is identified. Review of the MIP images confirms the above findings. IMPRESSION: No change in the appearance of bilateral pulmonary emboli. Negative for right heart strain. Possible small pulmonary infarct right middle lobe is unchanged. New trace right pleural effusion. Fatty infiltration of the liver. Electronically Signed   By: Inge Rise M.D.   On: 03/29/2015 16:58     ASSESSMENT AND PLAN:   * Acute bilateral pulmonary emboli  Given heparin IV drip, fu/ echocardiogram.   switced to eliquis.- currently on heparin drip.   Thrombolysis by vascular 03/29/15- now much better.  * Acute respiratory failure with hypoxia and hypercapnia, due to pulmonary emboli and OSA,   was on BIPAP , now on nasal canula,  NEB prn.  now on HFNC- this happened 1 day after admission- may be had another blood clot?   I spoke to Cardio and Vascular- and suggested to get a repeat CT angio to check for clot burden.- it showed same clot.   Vascular did thrombolysis and IVC filter- as pt was having worsening symptoms.    Now vascular restarted on heparin drip for now,  * A fib with RVR   Called urgent cardio consult- started on amio  drip.   HR stable now.  * Left leg DVT. On eliquis.   S/p IVC filter- On heparin drip again.  * Hypertension Continue metoprolol.  * Left ventricular hypertrophy Continue Lasix.  * Chest pain This is secondary to pulmonary emboli, Dilaudid PCA.    I discussed with Dr. Mortimer Fries, Dr. Rockey Situ, Dr. Carmin Richmond All the records are reviewed and case discussed with Care Management/Social Workerr. Management plans discussed with the patient, family and they are in agreement.  CODE STATUS: Full code.  TOTAL CRITICAL TIME TAKING CARE OF THIS PATIENT: 60 minutes.  Greater than 50% time was spent on coordination of care and face-to-face counseling.  POSSIBLE D/C IN 2-3 DAYS, DEPENDING ON CLINICAL CONDITION.   Vaughan Basta M.D on 03/30/2015 at 9:17 AM  Between 7am to 6pm - Pager - [phone]  After 6pm go to [email or web address] - [email or web address] [facility]  [facility]  Office  [phone]  CC: Primary care physician; Webb Silversmith, NP

## 2015-03-30 NOTE — Evaluation (Signed)
Physical Therapy Evaluation Patient Details Name: Richard [name] MRN: [id number] DOB: 02/09/1960 Today's Date: 03/30/2015   History of Present Illness  Richard [name]  is a 55 y.o. male with a known history of hypertension, chest pain, left ventricular hypertrophic, urinary incontinence, obstructive sleep apnea on CPAP use, bilateral knee arthritis status post surgeries. For last few days he is feeling little bit more short of breath and tired, followed by pain in his right side of the chest which is sharp and stabbing and pt unable to take a deep breath. Noted to have bilateral pulmonary emboli in ER and requiring IV pain medication to control the pain, also slightly hypoxic and requiring oxygen. He denies any past history of blood clots, he had bilateral knee surgeries done in the last year and he was given a short course of oral anticoagulation after the surgery as a prevention. Patient has undergone successful thrombectomy of right pulmonary embolism and placement of an IVC filter. He is now on heparin and we will transition back to Eliquis once we see how well he is tolerating his left leg DVT. At time of PT evaluation pt is eager to ambulate. Both vascular and intensivists' notes recommend mobilization of patient. Discussion with intensivist to ensure pt is safe to begin ambulation. Pt denies falls in the last 12 months. Denies SOB upon arrival. No specific questions or concerns.   Clinical Impression  Pt demonstrates need for minimal assist for bed mobility due to L shoulder pain and difficulty moving on air mattress. However transfers and ambulation are performed at [facility] only with +2 present for equipment management. Pt reports new onset L shoulder pain since this morning which is tender to palpation over superior aspect of shoulder and with AROM abduction and flexion. He is also complaining of trembling in LLE. Pt demonstrates 5-6 beats of nonsustained clonus in L plantarflexors with testing. He  also demonstrates positive Babinski sign on LLE. Both of these are concerning for neurological insult. Otherwise pt denies focal weakness or deficits in sensation to light touch. No gross focal weakness noted with strength testing in UE/LE. Dr Mortimer Fries MD and [facility] RN both notified of findings. Pt demonstrates excellent ambulation with SaO2 remaining >90% on 6 L/min O2. Distance is limited at this time by therapist to assess tolerance to mobility. No reported increase in LLE pain with ambulation and no clonus evident with ambulation. Pt will be safe to return home with [facility] PT when medically stable for discharge. Pt will benefit from skilled PT services to address deficits in strength, balance, and mobility in order to return to full function at home.     Follow Up Recommendations Home health PT    Equipment Recommendations  None recommended by PT    Recommendations for Other Services       Precautions / Restrictions Precautions Precautions: Fall Restrictions Weight Bearing Restrictions: No      Mobility  Bed Mobility Overal bed mobility: Needs Assistance Bed Mobility: Supine to Sit     Supine to sit: Min assist     General bed mobility comments: Pt requires some assist for trunk righting due to L shoulder pain with difficulty coming up to sitting at EOB. Once upright pt is steady in sitting. Vitals obtained after pt upright in sitting and no orthostatic hypotension noted. Pt denies dizziness.  Transfers Overall transfer level: Needs assistance Equipment used: Rolling walker (2 wheeled) Transfers: Sit to/from Stand Sit to Stand: Min guard  General transfer comment: Pt demonstrates good speed and sequencing with transfers. Able to readily accept weight to LLE without report of increase in pain. Denies lightheadedness or dizziness with transfers  Ambulation/Gait Ambulation/Gait assistance: Min guard Ambulation Distance (Feet): 120 Feet Assistive device: Rolling walker (2  wheeled) (Bariatric rolling walker) Gait Pattern/deviations: Step-through pattern;Decreased step length - left;Decreased step length - right Gait velocity: Decreased Gait velocity interpretation: Below normal speed for age/gender General Gait Details: Pt demonstrates decrease in step length bilateral but overall gait speed is functional for household mobility. Overall pt is steady and stable with ambulation especially with UE support on rolling walker. Continuous monitoring of vitals which remain WNL throughout ambulation. Pt on 6L/min O2 during ambulation as he was on 4.5 L/min at rest and portable tank is either 4 or 6 L/min. Pt denies DOE and SaO2 remains >90% throughout ambulation distance  Stairs            Wheelchair Mobility    Modified Rankin (Stroke Patients Only)       Balance Overall balance assessment: Needs assistance Sitting-balance support: No upper extremity supported Sitting balance-Leahy Scale: Good     Standing balance support: No upper extremity supported Standing balance-Leahy Scale: Fair                               Pertinent Vitals/Pain Pain Assessment: Faces Faces Pain Scale: Hurts little more Pain Location: Pt does not rate pain at this time. He denies LLE pain or chest pain at rest. Pt does confirm some chest pain with coughing. Pt complains of new onset L shoulder pain today. Pain is reproduced with palpation of superior aspect of L shoulder and with AROM of L shoulder to approximatley 90 degrees Pain Intervention(s): Monitored during session;Limited activity within patient's tolerance    Home Living Family/patient expects to be discharged to:: Private residence Living Arrangements: Spouse/significant other Available Help at Discharge: Family Type of Home: House Home Access: Stairs to enter Entrance Stairs-Rails: Right Entrance Stairs-Number of Steps: 3 Home Layout: One level Home Equipment: Cane - quad;Crutches;Walker -  standard;Bedside commode (No rolling walker, no shower chair, no wheelchair)      Prior Function Level of Independence: Independent         Comments: Pt using axillary crutch under right arm prn to help with LLE pain. Able to ambulate without assistive device. Pt requiring some assist donning socks but otherwise independent. Driving and ambulating limited community distance due to SOB and decreased energy     Hand Dominance   Dominant Hand: Right    Extremity/Trunk Assessment   Upper Extremity Assessment: LUE deficits/detail;Overall [facility] for tasks assessed       LUE Deficits / Details: Pt reports L shoulder pain with flexion and abduction to approximately 90 degrees. Unwilling to abduct above 90 degrees due to pain. Pt reports normal sensation to light touch in RUE/LUE   Lower Extremity Assessment: LLE deficits/detail   LLE Deficits / Details: Pt demonstrates full functional strength in bilateral LEs. He lacks approximately 10-15 degrees of L knee extension s/p TKR per gross visual assessment. Pt complains of some LLE trembling with light touch. Pt with positive 5-6 beats of nonsustained clonus of L plantar flexors with quick stretch. Positive Babinski on LLE. Both MD and RN notified. No focal weakness or sensory deficits noted in LLE     Communication   Communication: No difficulties  Cognition Arousal/Alertness: Awake/alert Behavior During Therapy:  WFL for tasks assessed/performed Overall Cognitive Status: Within Functional Limits for tasks assessed                      General Comments      Exercises General Exercises - Lower Extremity Ankle Circles/Pumps: Strengthening;Both;10 reps;Supine Quad Sets: Strengthening;Both;10 reps;Supine Gluteal Sets: Strengthening;Both;10 reps;Supine Short Arc Quad: Strengthening;Left;10 reps;Supine Heel Slides: Strengthening;Left;10 reps;Supine Hip ABduction/ADduction: Strengthening;Both;10 reps;Supine Straight Leg Raises:  Strengthening;Both;10 reps;Supine      Assessment/Plan    PT Assessment Patient needs continued PT services  PT Diagnosis Abnormality of gait;Difficulty walking;Generalized weakness;Acute pain   PT Problem List Decreased strength;Decreased range of motion;Decreased activity tolerance;Decreased mobility;Decreased balance;Cardiopulmonary status limiting activity;Pain;Obesity  PT Treatment Interventions DME instruction;Gait training;Stair training;Therapeutic activities;Therapeutic exercise;Balance training;Neuromuscular re-education;Patient/family education;Manual techniques   PT Goals (Current goals can be found in the Care Plan section) Acute Rehab PT Goals Patient Stated Goal: Return home and get off the oxygen PT Goal Formulation: With patient Time For Goal Achievement: 04/13/15 Potential to Achieve Goals: Good    Frequency Min 2X/week   Barriers to discharge        Co-evaluation               End of Session Equipment Utilized During Treatment: Gait belt;Oxygen Activity Tolerance: Patient tolerated treatment well Patient left: in chair;with call bell/phone within reach Nurse Communication: Other (comment) (L ankle clonus, positive Babinski)         Time: [id number] PT Time Calculation (min) (ACUTE ONLY): 52 min   Charges:   PT Evaluation $PT Eval High Complexity: 1 Procedure PT Treatments $Therapeutic Exercise: 8-22 mins   PT G Codes:       Lyndel Safe Huprich PT, DPT   Huprich,Jason 03/30/2015, 2:07 PM

## 2015-03-30 NOTE — Progress Notes (Signed)
SUBJECTIVE:  The patient underwent thrombectomy yesterday with significant improvement in respiratory status. He did have recurrent episodes of atrial fibrillation with rapid ventricular response which responded to IV amiodarone. He is currently in normal sinus rhythm and resting comfortably.    Filed Vitals:   03/30/15 0754 03/30/15 0800 03/30/15 0900 03/30/15 0948  BP:  130/72 146/73   Pulse:  72 79 75  Temp:      TempSrc:      Resp: 15 14 18    Height:      Weight:      SpO2: 100% 100% 100%     Intake/Output Summary (Last 24 hours) at 03/30/15 1027 Last data filed at 03/30/15 0900  Gross per 24 hour  Intake 2061.81 ml  Output   2600 ml  Net -538.19 ml    LABS: Basic Metabolic Panel:  Recent Labs  03/28/15 2021 03/29/15 0434 03/30/15 0459  NA  --  137 135  K  --  4.3 3.9  CL  --  101 99*  CO2  --  29 29  GLUCOSE  --  98 116*  BUN  --  21* 16  CREATININE  --  1.00 0.93  CALCIUM  --  8.8* 8.5*  MG 1.9 2.0  --    Liver Function Tests: No results for input(s): AST, ALT, ALKPHOS, BILITOT, PROT, ALBUMIN in the last 72 hours.  Recent Labs  03/27/15 1251  LIPASE 23   CBC:  Recent Labs  03/29/15 0434 03/30/15 0459  WBC 9.4 8.5  HGB 11.9* 12.1*  HCT 36.3* 36.3*  MCV 80.3 79.7*  PLT 193 191   Cardiac Enzymes:  Recent Labs  03/27/15 1251 03/29/15 1044 03/30/15 0034  TROPONINI <0.03 <0.03 0.03  0.03   BNP: Invalid input(s): POCBNP D-Dimer: No results for input(s): DDIMER in the last 72 hours. Hemoglobin A1C: No results for input(s): HGBA1C in the last 72 hours. Fasting Lipid Panel:  Recent Labs  03/29/15 0434  CHOL 124  HDL 30*  LDLCALC 80  TRIG 71  CHOLHDL 4.1   Thyroid Function Tests:  Recent Labs  03/30/15 0459  TSH 1.741   Anemia Panel: No results for input(s): VITAMINB12, FOLATE, FERRITIN, TIBC, IRON, RETICCTPCT in the last 72 hours.   PHYSICAL EXAM General: Well developed, well nourished, in no acute distress HEENT:   Normocephalic and atramatic Neck:  No JVD.  Lungs: Clear bilaterally to auscultation and percussion. Heart: HRRR . Normal S1 and S2 without gallops or murmurs.  Abdomen: Bowel sounds are positive, abdomen soft and non-tender  Msk:  Back normal, normal gait. Normal strength and tone for age. Extremities: No clubbing, cyanosis or edema.     TELEMETRY: Reviewed telemetry pt in Normal sinus rhythm:  ASSESSMENT AND PLAN:  1. Acute Bilateral PE/Left LE DVT: S/P L TKA in November with subsequent swelling and limited mobility s/p manipulation by ortho under anesthesia in late December. - s/p IVC filter and thrombolysis per vascular surgery. -Eliquis 10 mg BID x 7 days, followed by 5 mg bid once cleared by Vascular surgery.   2. Afib RVR:  In setting of #1. IV amio started earlier and he has since converted to sinus rhythm. CHA2DS2VASc = 1 . -Cont IV amio for now then switch to oral tomorrow.  -Cont Eliquis (PE treatment dosing). -Cont oral ? blocker, which he was on @ home.  -Likely does not require long term anticoagulation for Afib alone.  3. Acute respiratory failure: Improved.   4. Chronic  Diastolic CHF: Nl LV fxn by echo this admission. No need for diuresis.   5. Hypertensive Heart Disease: Stable.  6. OSA: Uses CPAP.    Kathlyn Sacramento, MD, [facility] 03/30/2015 10:27 AM

## 2015-03-30 NOTE — Progress Notes (Signed)
ANTICOAGULATION CONSULT NOTE - Initial Consult  Pharmacy Consult for heparin drip Indication: pulmonary embolus  Allergies  Allergen Reactions  . Penicillins Other (See Comments)    Reaction:  Unknown     Patient Measurements: Height: 5\' 9"  (175.3 cm) Weight: (!) 303 lb 9.2 oz (137.7 kg) IBW/kg (Calculated) : 70.7 Heparin Dosing Weight: 103 kg  Vital Signs: Temp: 98.1 F (36.7 C) (03/09 0752) Temp Source: Oral (03/09 0752) BP: 130/72 mmHg (03/09 0800) Pulse Rate: 72 (03/09 0800)  Labs:  Recent Labs  03/27/15 1251  03/28/15 0431 03/29/15 0434 03/29/15 1044 03/30/15 0034 03/30/15 0459  HGB 13.8  --  12.3* 11.9*  --   --  12.1*  HCT 42.7  --  37.5* 36.3*  --   --  36.3*  PLT 240  --  195 193  --   --  191  APTT  --   --   --   --   --  50* 50*  LABPROT 14.7  --   --   --   --   --   --   INR 1.13  --   --   --   --   --   --   HEPARINUNFRC  --   < > 0.34  --   --  2.42* 2.00*  CREATININE 0.94  --  1.24 1.00  --   --  0.93  TROPONINI <0.03  --   --   --  <0.03 0.03  0.03  --   < > = values in this interval not displayed.  Estimated Creatinine Clearance: 125.2 mL/min (by C-G formula based on Cr of 0.93).   Medical History: Past Medical History  Diagnosis Date  . Obesity   . Hypertensive heart disease   . Chest pain     a. 2015 Abnl MV;  b. 2015 Cath: nl cors.  . Chronic diastolic CHF (congestive heart failure) ([place])     a. 03/2015 Echo: EF 55-65%, poor windows, mildly dil RV.  Marland Kitchen GERD (gastroesophageal reflux disease)   . Arthritis   . Urine incontinence   . Primary localized osteoarthritis of right knee     a. 02/2014 s/p R Partial Knee arthroplasty.  . Primary localized osteoarthritis of left knee     a. 11/2014 s/p L TKA.  Marland Kitchen OSA (obstructive sleep apnea)     a. uses CPAP nightly  . Stiffness of left knee     a. 12/2014 s/p manipulation under anesthesia.  Marland Kitchen DVT (deep venous thrombosis) ([place])     a. 03/2015 U/S: occlusive DVT w/in the distal aspect of the  Left fem vein through the L popliteal vein.  . Bilateral pulmonary embolism ([place])     a. 03/2015 CTA: acute bilat PE.    Medications:  Scheduled:  . antiseptic oral rinse  7 mL Mouth Rinse BID  . aspirin EC  81 mg Oral Daily  . HYDROmorphone   Intravenous 6 times per day  . ipratropium-albuterol  3 mL Nebulization Q6H  . metoprolol succinate  12.5 mg Oral BID  . pantoprazole  40 mg Oral Daily  . senna-docusate  1 tablet Oral BID   Infusions:  . amiodarone 30 mg/hr (03/30/15 0153)  . heparin 1,750 Units/hr (03/29/15 2142)    Assessment: Pharmacy consulted to initiate heparin drip in a 55 yo male with pulmonary embolism.  Patient previously receiving apixaban 10 mg po BID for PE treatment.  Last dose of apixaban 10 mg today at  1056.  Est CrCl~115 mL/min, Baseline aPTT and HL pending  Goal of Therapy:  HL of 0.3-0.7 APTT: 66-102 s Monitor CBC daily   Plan:  No bolus per MD request and dose of apixaban.  Will initiate patient on 1750 units/hr to start ~12 hours after last apixaban dose at 2300.  Will order an aPTT in 6 hours at 0500 as would anticipate that HL will be elevated due to recent apixaban use. Obtain HL daily until correlates with aPTT values. CBC in AM.  Baseline aPTT: 50 Baseline HL: 2.42  3/9: HL 2.00, aPTT 50 Will increase patient's heparin gtt to 1850 units/hr. Will obtain follow up HL in AM and aPTT in 6 hrs.    Vena Rua 03/30/2015,8:40 AM

## 2015-03-30 NOTE — Consult Note (Signed)
[facility] Consultation      Date: 03/30/2015,   MRN# [id number] Richard [name] 08/29/1960 Code Status:     Code Status Orders        Start     Ordered   03/27/15 1728  Full code   Continuous     03/27/15 1727    Code Status History    Date Active Date Inactive Code Status Order ID Comments User Context   12/06/2014  1:32 PM 12/09/2014  3:04 PM Full Code [id number]  Marchia Bond, MD Inpatient   02/25/2014  7:08 PM 02/26/2014  1:02 PM Full Code [id number]  Johnny Bridge, MD Inpatient   01/12/2014  9:32 AM 01/12/2014  2:58 PM Full Code [id number]  Jettie Booze, MD Inpatient   08/16/2011  1:45 AM 08/17/2011  4:01 PM Full Code [id number]  Marlane Hatcher, RN Inpatient     Hosp day:@LENGTHOFSTAYDAYS @ Referring MD: @ATDPROV @     PCP:      AdmissionWeight: 299 lb (135.626 kg)                 CurrentWeight: (!) 303 lb 9.2 oz (137.7 kg) Richard [name] is a 55 y.o. old male seen in consultation for PE at the request of Dr. Bridgett Larsson     CHIEF COMPLAINT:   Acute SOB   HISTORY OF PRESENT ILLNESS  Patient looks comfortable today, less chest pain, minimal oxygen requirement Vasc surgery consulted s/p               1.  Selective injection right pulmonary artery 2. Infusion of 8 mg TPA right pulmonary artery 3. Mechanical thrombectomy with the Indigo CAT8 right pulmonary artery 4. Insertion infrarenal inferior vena cava filter Celect type  5. Ultrasound-guided access to the right common femoral vein   MEDICATIONS    Home Medication:  No current outpatient prescriptions on file.  Current Medication:  Current facility-administered medications:  .  acetaminophen (TYLENOL) tablet 650 mg, 650 mg, Oral, Q6H PRN, Demetrios Loll, MD, 650 mg at 03/28/15 1652 .  [COMPLETED] amiodarone (NEXTERONE) 1.8 mg/mL load via infusion 150 mg, 150 mg, Intravenous, Once, 150 mg at 03/29/15 1510 **FOLLOWED BY** [EXPIRED]  amiodarone (NEXTERONE PREMIX) 360 MG/200ML (1.8 mg/mL) IV infusion, 60 mg/hr, Intravenous, Continuous, Stopped at 03/29/15 2014 **FOLLOWED BY** amiodarone (NEXTERONE PREMIX) 360 MG/200ML (1.8 mg/mL) IV infusion, 30 mg/hr, Intravenous, Continuous, Minna Merritts, MD, Last Rate: 16.7 mL/hr at 03/30/15 0858, 30 mg/hr at 03/30/15 0858 .  antiseptic oral rinse (CPC / CETYLPYRIDINIUM CHLORIDE 0.05%) solution 7 mL, 7 mL, Mouth Rinse, BID, Demetrios Loll, MD, 7 mL at 03/29/15 2200 .  aspirin EC tablet 81 mg, 81 mg, Oral, Daily, Vaughan Basta, MD, 81 mg at 03/29/15 1055 .  baclofen (LIORESAL) tablet 10 mg, 10 mg, Oral, TID PRN, Vaughan Basta, MD .  diphenhydrAMINE (BENADRYL) injection 12.5 mg, 12.5 mg, Intravenous, Q6H PRN **OR** diphenhydrAMINE (BENADRYL) 12.5 MG/5ML elixir 12.5 mg, 12.5 mg, Oral, Q6H PRN, Vaughan Basta, MD .  furosemide (LASIX) tablet 40 mg, 40 mg, Oral, Daily PRN, Vaughan Basta, MD .  heparin ADULT infusion 100 units/mL (25000 units/250 mL), 1,850 Units/hr, Intravenous, Continuous, Vena Rua, RPH, Last Rate: 18.5 mL/hr at 03/30/15 0858, 1,850 Units/hr at 03/30/15 0858 .  HYDROmorphone (DILAUDID) 1 mg/mL PCA injection, , Intravenous, 6 times per day, Vaughan Basta, MD, 0.5 mg at 03/30/15 0400 .  HYDROmorphone (DILAUDID) injection 0.5-1 mg, 0.5-1 mg, Intravenous, Q4H PRN, Anders Simmonds, MD, 1 mg  at 03/29/15 2011 .  ipratropium-albuterol (DUONEB) 0.5-2.5 (3) MG/3ML nebulizer solution 3 mL, 3 mL, Nebulization, Q6H, Flora Lipps, MD, 3 mL at 03/30/15 0757 .  metoprolol (LOPRESSOR) injection 5 mg, 5 mg, Intravenous, Q6H PRN, Reatha Harps Pyreddy, MD .  metoprolol succinate (TOPROL-XL) 24 hr tablet 12.5 mg, 12.5 mg, Oral, BID, Vaughan Basta, MD, 12.5 mg at 03/29/15 2139 .  naloxone [facility]) injection 0.4 mg, 0.4 mg, Intravenous, PRN **AND** sodium chloride flush (NS) 0.9 % injection 9 mL, 9 mL, Intravenous, PRN, Vaughan Basta, MD .  ondansetron  (ZOFRAN) injection 4 mg, 4 mg, Intravenous, Q6H PRN, Vaughan Basta, MD .  oxyCODONE-acetaminophen (PERCOCET/ROXICET) 5-325 MG per tablet 1-2 tablet, 1-2 tablet, Oral, Q6H PRN, Flora Lipps, MD .  pantoprazole (PROTONIX) EC tablet 40 mg, 40 mg, Oral, Daily, Vaughan Basta, MD, 40 mg at 03/29/15 1055 .  senna-docusate (Senokot-S) tablet 1 tablet, 1 tablet, Oral, BID, Flora Lipps, MD, 1 tablet at 03/29/15 2139    ALLERGIES   Penicillins     REVIEW OF SYSTEMS   Review of Systems  Constitutional: Negative for fever, chills, weight loss and malaise/fatigue.  HENT: Negative for congestion and hearing loss.   Eyes: Negative for blurred vision and double vision.  Respiratory: Positive for shortness of breath. Negative for cough, hemoptysis, sputum production and wheezing.   Cardiovascular: Positive for leg swelling. Negative for chest pain, palpitations and orthopnea.  Gastrointestinal: Negative for heartburn, nausea, vomiting and abdominal pain.  Genitourinary: Negative for dysuria and urgency.  Musculoskeletal: Positive for joint pain. Negative for myalgias.  Skin: Negative for rash.  Neurological: Negative for dizziness, tingling, tremors and headaches.  Endo/Heme/Allergies: Does not bruise/bleed easily.  Psychiatric/Behavioral: The patient is not nervous/anxious.   All other systems reviewed and are negative.    VS: BP 130/72 mmHg  Pulse 72  Temp(Src) 98.1 F (36.7 C) (Oral)  Resp 14  Ht 5\' 9"  (1.753 m)  Wt 303 lb 9.2 oz (137.7 kg)  BMI 44.81 kg/m2  SpO2 100%     PHYSICAL EXAM  Physical Exam  Constitutional: He is oriented to person, place, and time. He appears well-developed and well-nourished. No distress.  HENT:  Head: Normocephalic and atraumatic.  Mouth/Throat: No oropharyngeal exudate.  Eyes: EOM are normal. Pupils are equal, round, and reactive to light. No scleral icterus.  Neck: Normal range of motion. Neck supple.  Cardiovascular: Normal rate,  regular rhythm and normal heart sounds.   No murmur heard. Pulmonary/Chest: No stridor. No respiratory distress. He has no wheezes.  Abdominal: Soft. Bowel sounds are normal. He exhibits no distension. There is no tenderness. There is no rebound.  Musculoskeletal: Normal range of motion. He exhibits no edema.  Left leg swelling  Neurological: He is alert and oriented to person, place, and time. Coordination normal.  Skin: Skin is warm. He is not diaphoretic.  Psychiatric: He has a normal mood and affect.        LABS    Recent Labs     03/27/15  1251  03/28/15  0431  03/29/15  0434  03/30/15  0459  HGB  13.8  12.3*  11.9*  12.1*  HCT  42.7  37.5*  36.3*  36.3*  MCV  81.0  81.8  80.3  79.7*  WBC  9.9  9.3  9.4  8.5  BUN  17  18  21*  16  CREATININE  0.94  1.24  1.00  0.93  GLUCOSE  86  107*  98  116*  CALCIUM  9.1  8.7*  8.8*  8.5*  INR  1.13   --    --    --   ,      IMAGING    Ct Angio Chest Pe W/cm &/or Wo Cm  03/29/2015  CLINICAL DATA:  Acute onset shortness of breath 2 days ago with right chest pain with a diagnosis of pulmonary embolus at that time. Shortness of breath worsened this morning. Question increased pulmonary embolus burden. Initial encounter. EXAM: CT ANGIOGRAPHY CHEST WITH CONTRAST TECHNIQUE: Multidetector CT imaging of the chest was performed using the standard protocol during bolus administration of intravenous contrast. Multiplanar CT image reconstructions and MIPs were obtained to evaluate the vascular anatomy. CONTRAST:  100 ml OMNIPAQUE IOHEXOL 350 MG/ML SOLN COMPARISON:  CTA chest 03/27/2015. FINDINGS: Bilateral pulmonary emboli are unchanged. The RV to LV ratio is 0.84 which is negative for right heart strain. The patient has a very small right pleural effusion which is new since the prior study. No left pleural effusion is noted. Small mediastinal lymph nodes including a 1.2 cm prevascular node on image 46 and a 1.0 cm right paratracheal node on image  47 are unchanged. The lungs demonstrate scattered atelectatic change. Peripheral opacity in the right middle lobe which could be due to atelectasis or possibly infarct is unchanged in appearance. The lungs are otherwise unremarkable. Visualized upper abdomen demonstrates fatty infiltration of the liver. Imaged intra-abdominal contents are otherwise unremarkable. The no focal bony abnormality is identified. Review of the MIP images confirms the above findings. IMPRESSION: No change in the appearance of bilateral pulmonary emboli. Negative for right heart strain. Possible small pulmonary infarct right middle lobe is unchanged. New trace right pleural effusion. Fatty infiltration of the liver. Electronically Signed   By: Inge Rise M.D.   On: 03/29/2015 16:58   Ct Angio Chest Pe W/cm &/or Wo Cm  03/27/2015  CLINICAL DATA:  Right-sided chest pain radiating under the right breast EXAM: CT ANGIOGRAPHY CHEST WITH CONTRAST TECHNIQUE: Multidetector CT imaging of the chest was performed using the standard protocol during bolus administration of intravenous contrast. Multiplanar CT image reconstructions and MIPs were obtained to evaluate the vascular anatomy. CONTRAST:  162mL OMNIPAQUE IOHEXOL 350 MG/ML SOLN COMPARISON:  None. FINDINGS: There is adequate opacification of the pulmonary arteries. There is pulmonary embolus in the right main pulmonary artery, right upper lobe lobar artery, left lower lobe and right lower lobe lobar artery and right middle lobe lobar pulmonary artery. The main pulmonary artery, right main pulmonary artery and left main pulmonary arteries are normal in size. The heart size is normal. There is no pericardial effusion. There is right middle lobe airspace disease peripherally which may reflect atelectasis versus pulmonary infarct. There is no pleural effusion or pneumothorax. Bilateral retroareolar fibroglandular tissue as can be seen with gynecomastia. There is no axillary, hilar, or  mediastinal adenopathy. There is no lytic or blastic osseous lesion. The visualized portions of the upper abdomen are unremarkable. Review of the MIP images confirms the above findings. IMPRESSION: 1. Acute bilateral pulmonary emboli. Critical Value/emergent results were called by telephone at the time of interpretation on 03/27/2015 at 2:13 pm to Dr. Lenise Arena , who verbally acknowledged these results. Electronically Signed   By: Kathreen Devoid   On: 03/27/2015 14:16   US Venous Img Lower Unilateral Left  03/27/2015  CLINICAL DATA:  Left lower extremity pain and edema. History of knee surgery on 09/2014 with subsequent manipulation on 12/2014. History of pulmonary embolism.  EXAM: LEFT LOWER EXTREMITY VENOUS DOPPLER ULTRASOUND TECHNIQUE: Gray-scale sonography with graded compression, as well as color Doppler and duplex ultrasound were performed to evaluate the lower extremity deep venous systems from the level of the common femoral vein and including the common femoral, femoral, profunda femoral, popliteal and calf veins including the posterior tibial, peroneal and gastrocnemius veins when visible. The superficial great saphenous vein was also interrogated. Spectral Doppler was utilized to evaluate flow at rest and with distal augmentation maneuvers in the common femoral, femoral and popliteal veins. COMPARISON:  Chest CTA - earlier same day FINDINGS: Contralateral Common Femoral Vein: Respiratory phasicity is normal and symmetric with the symptomatic side. No evidence of thrombus. Normal compressibility. Common Femoral Vein: No evidence of thrombus. Normal compressibility, respiratory phasicity and response to augmentation. Saphenofemoral Junction: No evidence of thrombus. Normal compressibility and flow on color Doppler imaging. Profunda Femoral Vein: No evidence of thrombus. Normal compressibility and flow on color Doppler imaging. Femoral Vein: There is a minimal amount of hypoechoic occlusive thrombus  within the distal aspect of the left femoral vein (represent images 14 and 30). Popliteal Vein: There is hypoechoic expansile thrombus throughout the imaged course of the left popliteal vein (image 15, 16, 31 and 34). Calf Veins: Suboptimally visualized. Superficial Great Saphenous Vein: No evidence of thrombus. Normal compressibility and flow on color Doppler imaging. Venous Reflux:  None. Other Findings:  None. IMPRESSION: The examination is positive for occlusive DVT within the distal aspect of the left femoral vein extending through the imaged course of the left popliteal vein. Electronically Signed   By: Sandi Mariscal M.D.   On: 03/27/2015 17:02         ASSESSMENT/PLAN   55 yo obese white male with acute SOB from acute b/L PE with acute DVT from immobilization with underlying OSA  1.oxygen as needed 2.biPAP and CPAP for OSA as needed 3.continue anticoagulation 4.pateint with afib with RVR -continue amiodarone 5.afib with RVR-started on amiodarone 6.recommend ambulation    The Patient requires high complexity decision making for assessment and support, frequent evaluation and titration of therapies, application of advanced monitoring technologies and extensive interpretation of multiple databases.   Patient are satisfied with Plan of action and management. All questions answered  Corrin Parker, M.D.  [facility]  Medical Director [place] Director [facility] Cardio-Pulmonary Department

## 2015-03-30 NOTE — Progress Notes (Signed)
[place] Progress Note Patient Name: Richard [name] DOB: 18-Oct-1960 MRN: [id number]   Date of Service  03/30/2015  HPI/Events of Note  Paroxysmal AF - short runs with rates as high as 130s  eICU Interventions  Amiodarone bolus 150 mg Continue amiodarone infusion at 30 mg/hr     Intervention Category Major Interventions: Arrhythmia - evaluation and management  Merton Border 03/30/2015, 1:32 AMparox

## 2015-03-30 NOTE — Progress Notes (Signed)
Since lowering the dose of Amio to 30mg , pt has had several episodes of A-fib with HR in 130s-140s. Pt has been converting back to SR. Dr. Leonidas Romberg notified. New order given.

## 2015-03-30 NOTE — Progress Notes (Signed)
A&Ox4.  PCA d/c this shift. Denies chest pain. NSR per cardiac monitor and remains on amio drip until tomorrow. o2 sats 100% on 2L nasal cannula. No respiratory distress noted during shift. Family visited during the day and this evening.

## 2015-03-30 NOTE — Progress Notes (Signed)
Pharmacy ICU Daily Progress Note  MT is a 55yo. Admitted 03/27/15 for submassive PE and LE DVT. Pt is s/p mechanical thrombectomy and tPA infusion. Pt has continued episodes of Afib, HR 130s. Currently on 4LNC, O2 sat 100.  Active pharmacy consults: Apixaban  Plan:  1. HL D/c'd 3/10. Initiated Apixaban.   Infectious:  Antibiotics- s/p clindamycin x1 for surgical ppx  Electrolytes:  Potassium: 3.9 Supplementation plans: none  Pulmonary: On duonebs q6hr, O2Sat 96 on 3LNC Current steroids: none  GI:  Constipation PPx: senna-docusate 8.6mg  PO BID Feeding status: carb modified, thin liquids LBM: 03/29/15 SUP: pantoprazole 40mg  PO daily  Insulin:  SSI use in 24hrs: none Current Insulin orders: none Last 3 CBGs: 116, 98, 107  DVT PPx: heparin gtt d/c'd today, on apixaban  Sedation+Pain:  RASS goal? 0 GCS 15 Last pain score: 0 Opioid use in last 24 hrs: none  Pressors: none MAP goal: N/a  Medication education/counseling required? Previously counseled on apixaban for PE treatment

## 2015-03-30 NOTE — Progress Notes (Signed)
RN notified Dr. Anselm Jungling that patient complains of left shoulder pain that is not relieved by dilaudid PCA and that patient is not complaining of any chest pain and per patient he only uses PCA for shoulder pain.  Dr. Anselm Jungling stated he would order xray, change meds and it is okay to discontinue PCA.

## 2015-03-30 NOTE — Progress Notes (Signed)
ANTICOAGULATION CONSULT NOTE - Initial Consult  Pharmacy Consult for heparin drip Indication: pulmonary embolus  Allergies  Allergen Reactions  . Penicillins Other (See Comments)    Reaction:  Unknown     Patient Measurements: Height: 5\' 9"  (175.3 cm) Weight: (!) 303 lb 9.2 oz (137.7 kg) IBW/kg (Calculated) : 70.7 Heparin Dosing Weight: 103 kg  Vital Signs: Temp: 97.8 F (36.6 C) (03/09 2000) Temp Source: Oral (03/09 1535) BP: 143/58 mmHg (03/09 2100) Pulse Rate: 78 (03/09 2100)  Labs:  Recent Labs  03/28/15 0431 03/29/15 0434 03/29/15 1044 03/30/15 0034 03/30/15 0459 03/30/15 1453  HGB 12.3* 11.9*  --   --  12.1*  --   HCT 37.5* 36.3*  --   --  36.3*  --   PLT 195 193  --   --  191  --   APTT  --   --   --  50* 50* 80*  HEPARINUNFRC 0.34  --   --  2.42* 2.00*  --   CREATININE 1.24 1.00  --   --  0.93  --   TROPONINI  --   --  <0.03 0.03  0.03  --   --     Estimated Creatinine Clearance: 125.2 mL/min (by C-G formula based on Cr of 0.93).   Medical History: Past Medical History  Diagnosis Date  . Obesity   . Hypertensive heart disease   . Chest pain     a. 2015 Abnl MV;  b. 2015 Cath: nl cors.  . Chronic diastolic CHF (congestive heart failure) ([place])     a. 03/2015 Echo: EF 55-65%, poor windows, mildly dil RV.  Marland Kitchen GERD (gastroesophageal reflux disease)   . Arthritis   . Urine incontinence   . Primary localized osteoarthritis of right knee     a. 02/2014 s/p R Partial Knee arthroplasty.  . Primary localized osteoarthritis of left knee     a. 11/2014 s/p L TKA.  Marland Kitchen OSA (obstructive sleep apnea)     a. uses CPAP nightly  . Stiffness of left knee     a. 12/2014 s/p manipulation under anesthesia.  Marland Kitchen DVT (deep venous thrombosis) ([place])     a. 03/2015 U/S: occlusive DVT w/in the distal aspect of the Left fem vein through the L popliteal vein.  . Bilateral pulmonary embolism ([place])     a. 03/2015 CTA: acute bilat PE.    Medications:  Scheduled:  . antiseptic  oral rinse  7 mL Mouth Rinse BID  . aspirin EC  81 mg Oral Daily  . ipratropium-albuterol  3 mL Nebulization Q6H  . metoprolol succinate  12.5 mg Oral BID  . oxyCODONE  5 mg Oral 4 times per day  . pantoprazole  40 mg Oral Daily  . senna-docusate  1 tablet Oral BID  . senna-docusate  1 tablet Oral BID   Infusions:  . amiodarone 30 mg/hr (03/30/15 2157)  . heparin 1,850 Units/hr (03/30/15 2157)    Assessment: Pharmacy consulted to initiate heparin drip in a 55 yo male with pulmonary embolism.  Patient previously receiving apixaban 10 mg po BID for PE treatment.  Last dose of apixaban 10 mg 3/8 at 1056. Patient currently receiving heparin 1850units/hr.   Est CrCl~115 mL/min, Baseline aPTT and HL pending  Goal of Therapy:  HL of 0.3-0.7 APTT: 66-102 s Monitor CBC daily   Plan:   Will continue patient's heparin drip to 1850 units/hr. Will obtain follow up HL in AM and obtain confirmatory aPTT in  6 hrs.    Bralin Garry,Alden L 03/30/2015,10:08 PM

## 2015-03-30 NOTE — Progress Notes (Signed)
[place] NOTE  Pharmacy Consult for constipation prevention    Allergies  Allergen Reactions  . Penicillins Other (See Comments)    Reaction:  Unknown     Patient Measurements: Height: 5\' 9"  (175.3 cm) Weight: (!) 303 lb 9.2 oz (137.7 kg) IBW/kg (Calculated) : 70.7  Vital Signs: Temp: 97.8 F (36.6 C) (03/09 2000) Temp Source: Oral (03/09 1535) BP: 143/58 mmHg (03/09 2100) Pulse Rate: 78 (03/09 2100) Intake/Output from previous day: 03/08 0701 - 03/09 0700 In: 1995.4 [P.O.:360; I.V.:1635.4] Out: 2500 [Urine:2500] Intake/Output from this shift: Total I/O In: 70.4 [I.V.:70.4] Out: 300 [Urine:300] Vent settings for last 24 hours:    Labs:  Recent Labs  03/28/15 0431 03/28/15 2021 03/29/15 0434 03/30/15 0034 03/30/15 0459 03/30/15 1453  WBC 9.3  --  9.4  --  8.5  --   HGB 12.3*  --  11.9*  --  12.1*  --   HCT 37.5*  --  36.3*  --  36.3*  --   PLT 195  --  193  --  191  --   APTT  --   --   --  50* 50* 80*  CREATININE 1.24  --  1.00  --  0.93  --   MG  --  1.9 2.0  --   --   --    Estimated Creatinine Clearance: 125.2 mL/min (by C-G formula based on Cr of 0.93).   Recent Labs  03/29/15 1957 03/30/15 0041 03/30/15 0350  GLUCAP 144* 175* 187*    Medications:  Scheduled:  . antiseptic oral rinse  7 mL Mouth Rinse BID  . aspirin EC  81 mg Oral Daily  . ipratropium-albuterol  3 mL Nebulization Q6H  . metoprolol succinate  12.5 mg Oral BID  . oxyCODONE  5 mg Oral 4 times per day  . pantoprazole  40 mg Oral Daily  . [START ON 03/31/2015] senna-docusate  2 tablet Oral BID   Infusions:  . amiodarone 30 mg/hr (03/30/15 2157)  . heparin 1,850 Units/hr (03/30/15 2157)   PRN: acetaminophen, baclofen, furosemide, HYDROmorphone (DILAUDID) injection, metoprolol, oxyCODONE-acetaminophen  Assessment: Pharmacy consulted for constipation management for 55 yo male patient receiving scheduled oxycodone 5mg  QID.   Plan:  Will advance  patient to senna/docusate 2 tabs BID.    Pharmacy will continue to monitor and adjust per consult.   Stefana Lodico,Tillmon L 03/30/2015,10:12 PM

## 2015-03-31 DIAGNOSIS — I82492 Acute embolism and thrombosis of other specified deep vein of left lower extremity: Secondary | ICD-10-CM

## 2015-03-31 DIAGNOSIS — I480 Paroxysmal atrial fibrillation: Secondary | ICD-10-CM

## 2015-03-31 DIAGNOSIS — R0902 Hypoxemia: Secondary | ICD-10-CM

## 2015-03-31 LAB — HEPARIN LEVEL (UNFRACTIONATED): Heparin Unfractionated: 0.84 IU/mL — ABNORMAL HIGH (ref 0.30–0.70)

## 2015-03-31 MED ORDER — IPRATROPIUM-ALBUTEROL 0.5-2.5 (3) MG/3ML IN SOLN: 3.0000 mL | Freq: Four times a day (QID) | RESPIRATORY_TRACT | Status: DC | PRN

## 2015-03-31 MED ORDER — APIXABAN 5 MG PO TABS
10.0000 mg | ORAL_TABLET | Freq: Two times a day (BID) | ORAL | Status: DC
Start: 2015-03-31 — End: 2015-04-03
  Administered 2015-03-31 – 2015-04-03 (×7): 10 mg via ORAL
  Filled 2015-03-31 (×7): qty 2

## 2015-03-31 MED ORDER — APIXABAN 5 MG PO TABS: 5.0000 mg | ORAL_TABLET | Freq: Two times a day (BID) | ORAL | Status: DC

## 2015-03-31 MED ORDER — AMIODARONE HCL 200 MG PO TABS
200.0000 mg | ORAL_TABLET | Freq: Two times a day (BID) | ORAL | Status: DC
  Administered 2015-03-31 – 2015-04-01 (×3): 200 mg via ORAL
  Filled 2015-03-31 (×3): qty 1

## 2015-03-31 MED ORDER — MOMETASONE FURO-FORMOTEROL FUM 200-5 MCG/ACT IN AERO
2.0000 | INHALATION_SPRAY | Freq: Two times a day (BID) | RESPIRATORY_TRACT | Status: DC
  Administered 2015-03-31 – 2015-04-03 (×7): 2 via RESPIRATORY_TRACT
  Filled 2015-03-31: qty 8.8

## 2015-03-31 NOTE — Progress Notes (Signed)
Physical Therapy Treatment Patient Details Name: Richard [name] MRN: [id number] DOB: 03/12/1960 Today's Date: 03/31/2015    History of Present Illness Richard [name]  is a 55 y.o. male with a known history of hypertension, chest pain, left ventricular hypertrophic, urinary incontinence, obstructive sleep apnea on CPAP use, bilateral knee arthritis status post surgeries. For last few days he is feeling little bit more short of breath and tired, followed by pain in his right side of the chest which is sharp and stabbing and pt unable to take a deep breath. Noted to have bilateral pulmonary emboli in ER and requiring IV pain medication to control the pain, also slightly hypoxic and requiring oxygen. He denies any past history of blood clots, he had bilateral knee surgeries done in the last year and he was given a short course of oral anticoagulation after the surgery as a prevention. Patient has undergone successful thrombectomy of right pulmonary embolism and placement of an IVC filter. He is now on heparin and we will transition back to Eliquis once we see how well he is tolerating his left leg DVT. At time of PT evaluation pt is eager to ambulate. [name] vascular and intensivists' notes recommend mobilization of patient. Discussion with intensivist to ensure pt is safe to begin ambulation. Pt denies falls in the last 12 months. Denies SOB upon arrival. No specific questions or concerns.     PT Comments    Pt continues to have very stiff L knee and despite extensive pulsed and sustained flexion and extension overpressure acts has only ~15-50 ROM.  His O2 was in the high 90s on O2 and remained in the 90s on room air t/o session. It did drop occasionally into the high 80s but he was able to increase it with focused breathing.  Pt shows good strength, mobility and motivation though his L knee ROM was very limited.  Follow Up Recommendations  Home health PT     Equipment Recommendations  None recommended  by PT    Recommendations for Other Services       Precautions / Restrictions Precautions Precautions: Fall Restrictions Weight Bearing Restrictions: No    Mobility  Bed Mobility Overal bed mobility: Modified Independent Bed Mobility: Supine to Sit     Supine to sit: Min guard     General bed mobility comments: Pt does well getting to EOB needing only CGA with cuing to get to sitting  Transfers Overall transfer level: Modified independent Equipment used: Rolling walker (2 wheeled) Transfers: Sit to/from Stand Sit to Stand: Min guard         General transfer comment: Pt shows good ability to get to standing and maintain balance.   Ambulation/Gait Ambulation/Gait assistance: Min guard Ambulation Distance (Feet): 150 Feet Assistive device: Rolling walker (2 wheeled)       General Gait Details: Pt shows increased speed and confidence with ambulation.  He is unable to achieve TKE on the L but ultimately has no buckling or unsteadiness.    Stairs            Wheelchair Mobility    Modified Rankin (Stroke Patients Only)       Balance                                    Cognition Arousal/Alertness: Awake/alert Behavior During Therapy: WFL for tasks assessed/performed Overall Cognitive Status: Within Functional Limits for tasks assessed  Exercises General Exercises - Lower Extremity Quad Sets: Strengthening;[name];10 reps;Supine Short Arc Quad: Strengthening;Left;10 reps;Supine Heel Slides: PROM;Strengthening;10 reps Hip ABduction/ADduction: Strengthening;[name];10 reps;Supine Straight Leg Raises: Strengthening;[name];10 reps;Supine    General Comments        Pertinent Vitals/Pain Pain Assessment: 0-10 Pain Score: 6  Pain Location: Pt has considerable pain with ROM acts    Home Living                      Prior Function            PT Goals (current goals can now be found in the care plan  section) Progress towards PT goals: Progressing toward goals    Frequency  Min 2X/week    PT Plan Current plan remains appropriate    Co-evaluation             End of Session Equipment Utilized During Treatment: Gait belt Activity Tolerance: Patient tolerated treatment well Patient left: in bed;with nursing/sitter in room     Time: 1355-1419 PT Time Calculation (min) (ACUTE ONLY): 24 min  Charges:  $Gait Training: 8-22 mins $Therapeutic Exercise: 8-22 mins                    G Codes:     Richard [name], PT, DPT [phone]  Richard [name] 03/31/2015, 5:43 PM

## 2015-03-31 NOTE — Consult Note (Signed)
[facility] Consultation      Date: 03/31/2015,   MRN# [id number] Richard [name] 11/14/1960    AdmissionWeight: 299 lb (135.626 kg)                 CurrentWeight: (!) 303 lb 9.2 oz (137.7 kg) Richard [name] is a 55 y.o. old male seen in consultation for PE at the request of Dr. Bridgett Larsson     CHIEF COMPLAINT:   Acute SOB-resolving   [place]  Patient looks comfortable today, less chest pain, minimal oxygen requirement SOB much improved Vasc surgery consulted s/p               1.  Selective injection right pulmonary artery 2. Infusion of 8 mg TPA right pulmonary artery 3. Mechanical thrombectomy with the Indigo CAT8 right pulmonary artery 4. Insertion infrarenal inferior vena cava filter Celect type  5. Ultrasound-guided access to the right common femoral vein   MEDICATIONS    Home Medication:  No current outpatient prescriptions on file.  Current Medication:  Current facility-administered medications:  .  acetaminophen (TYLENOL) tablet 650 mg, 650 mg, Oral, Q6H PRN, Demetrios Loll, MD, 650 mg at 03/31/15 0847 .  amiodarone (PACERONE) tablet 200 mg, 200 mg, Oral, BID, Rogelia Mire, NP, 200 mg at 03/31/15 1037 .  antiseptic oral rinse (CPC / CETYLPYRIDINIUM CHLORIDE 0.05%) solution 7 mL, 7 mL, Mouth Rinse, BID, Demetrios Loll, MD, 7 mL at 03/30/15 2200 .  apixaban (ELIQUIS) tablet 10 mg, 10 mg, Oral, BID, 10 mg at 03/31/15 1039 **FOLLOWED BY** [START ON 04/07/2015] apixaban (ELIQUIS) tablet 5 mg, 5 mg, Oral, BID, Vaughan Basta, MD .  aspirin EC tablet 81 mg, 81 mg, Oral, Daily, Vaughan Basta, MD, 81 mg at 03/31/15 1037 .  baclofen (LIORESAL) tablet 10 mg, 10 mg, Oral, TID PRN, Vaughan Basta, MD .  furosemide (LASIX) tablet 40 mg, 40 mg, Oral, Daily PRN, Vaughan Basta, MD .  HYDROmorphone (DILAUDID) injection 0.5-1 mg, 0.5-1 mg, Intravenous, Q4H PRN, Anders Simmonds, MD, 1 mg at 03/29/15 2011 .  ipratropium-albuterol (DUONEB) 0.5-2.5 (3) MG/3ML nebulizer solution 3 mL, 3 mL, Nebulization, Q6H PRN, Vaughan Basta, MD .  metoprolol (LOPRESSOR) injection 5 mg, 5 mg, Intravenous, Q6H PRN, Reatha Harps Pyreddy, MD .  metoprolol succinate (TOPROL-XL) 24 hr tablet 12.5 mg, 12.5 mg, Oral, BID, Vaughan Basta, MD, 12.5 mg at 03/31/15 1037 .  mometasone-formoterol (DULERA) 200-5 MCG/ACT inhaler 2 puff, 2 puff, Inhalation, BID, Flora Lipps, MD, 2 puff at 03/31/15 1123 .  oxyCODONE (Oxy IR/ROXICODONE) immediate release tablet 5 mg, 5 mg, Oral, 4 times per day, Flora Lipps, MD, 5 mg at 03/31/15 1120 .  oxyCODONE-acetaminophen (PERCOCET/ROXICET) 5-325 MG per tablet 1-2 tablet, 1-2 tablet, Oral, Q6H PRN, Flora Lipps, MD .  pantoprazole (PROTONIX) EC tablet 40 mg, 40 mg, Oral, Daily, Vaughan Basta, MD, 40 mg at 03/31/15 1120 .  senna-docusate (Senokot-S) tablet 2 tablet, 2 tablet, Oral, BID, Charlett Nose, [facility], 2 tablet at 03/31/15 1119    ALLERGIES   Penicillins     REVIEW OF SYSTEMS   Review of Systems  Constitutional: Negative for fever, chills, weight loss and malaise/fatigue.  HENT: Negative for congestion and hearing loss.   Eyes: Negative for blurred vision and double vision.  Respiratory: Positive for shortness of breath. Negative for cough, hemoptysis, sputum production and wheezing.   Cardiovascular: Positive for leg swelling. Negative for chest pain, palpitations and orthopnea.  Gastrointestinal: Negative for heartburn, nausea,  vomiting and abdominal pain.  Genitourinary: Negative for dysuria and urgency.  Musculoskeletal: Positive for joint pain. Negative for myalgias.  Skin: Negative for rash.  Neurological: Negative for dizziness, tingling, tremors and headaches.  Endo/Heme/Allergies: Does not bruise/bleed easily.  Psychiatric/Behavioral: The patient is not nervous/anxious.   All other systems reviewed and are  negative.    VS: BP 146/73 mmHg  Pulse 76  Temp(Src) 97.6 F (36.4 C) (Oral)  Resp 19  Ht 5\' 9"  (1.753 m)  Wt 303 lb 9.2 oz (137.7 kg)  BMI 44.81 kg/m2  SpO2 95%     PHYSICAL EXAM  Physical Exam  Constitutional: He is oriented to person, place, and time. He appears well-developed and well-nourished. No distress.  HENT:  Head: Normocephalic and atraumatic.  Mouth/Throat: No oropharyngeal exudate.  Eyes: EOM are normal. Pupils are equal, round, and reactive to light. No scleral icterus.  Neck: Normal range of motion. Neck supple.  Cardiovascular: Normal rate, regular rhythm and normal heart sounds.   No murmur heard. Pulmonary/Chest: No stridor. No respiratory distress. He has no wheezes.  Abdominal: Soft. Bowel sounds are normal. He exhibits no distension. There is no tenderness. There is no rebound.  Musculoskeletal: Normal range of motion. He exhibits no edema.  Left leg swelling  Neurological: He is alert and oriented to person, place, and time. Coordination normal.  Skin: Skin is warm. He is not diaphoretic.  Psychiatric: He has a normal mood and affect.        LABS    Recent Labs     03/29/15  0434  03/30/15  0459  HGB  11.9*  12.1*  HCT  36.3*  36.3*  MCV  80.3  79.7*  WBC  9.4  8.5  BUN  21*  16  CREATININE  1.00  0.93  GLUCOSE  98  116*  CALCIUM  8.8*  8.5*  ,      IMAGING    Ct Angio Chest Pe W/cm &/or Wo Cm  03/29/2015  CLINICAL DATA:  Acute onset shortness of breath 2 days ago with right chest pain with a diagnosis of pulmonary embolus at that time. Shortness of breath worsened this morning. Question increased pulmonary embolus burden. Initial encounter. EXAM: CT ANGIOGRAPHY CHEST WITH CONTRAST TECHNIQUE: Multidetector CT imaging of the chest was performed using the standard protocol during bolus administration of intravenous contrast. Multiplanar CT image reconstructions and MIPs were obtained to evaluate the vascular anatomy. CONTRAST:  100 ml  OMNIPAQUE IOHEXOL 350 MG/ML SOLN COMPARISON:  CTA chest 03/27/2015. FINDINGS: Bilateral pulmonary emboli are unchanged. The RV to LV ratio is 0.84 which is negative for right heart strain. The patient has a very small right pleural effusion which is new since the prior study. No left pleural effusion is noted. Small mediastinal lymph nodes including a 1.2 cm prevascular node on image 46 and a 1.0 cm right paratracheal node on image 47 are unchanged. The lungs demonstrate scattered atelectatic change. Peripheral opacity in the right middle lobe which could be due to atelectasis or possibly infarct is unchanged in appearance. The lungs are otherwise unremarkable. Visualized upper abdomen demonstrates fatty infiltration of the liver. Imaged intra-abdominal contents are otherwise unremarkable. The no focal bony abnormality is identified. Review of the MIP images confirms the above findings. IMPRESSION: No change in the appearance of bilateral pulmonary emboli. Negative for right heart strain. Possible small pulmonary infarct right middle lobe is unchanged. New trace right pleural effusion. Fatty infiltration of the liver. Electronically Signed  By: Inge Rise M.D.   On: 03/29/2015 16:58   Ct Angio Chest Pe W/cm &/or Wo Cm  03/27/2015  CLINICAL DATA:  Right-sided chest pain radiating under the right breast EXAM: CT ANGIOGRAPHY CHEST WITH CONTRAST TECHNIQUE: Multidetector CT imaging of the chest was performed using the standard protocol during bolus administration of intravenous contrast. Multiplanar CT image reconstructions and MIPs were obtained to evaluate the vascular anatomy. CONTRAST:  137mL OMNIPAQUE IOHEXOL 350 MG/ML SOLN COMPARISON:  None. FINDINGS: There is adequate opacification of the pulmonary arteries. There is pulmonary embolus in the right main pulmonary artery, right upper lobe lobar artery, left lower lobe and right lower lobe lobar artery and right middle lobe lobar pulmonary artery. The main  pulmonary artery, right main pulmonary artery and left main pulmonary arteries are normal in size. The heart size is normal. There is no pericardial effusion. There is right middle lobe airspace disease peripherally which may reflect atelectasis versus pulmonary infarct. There is no pleural effusion or pneumothorax. Bilateral retroareolar fibroglandular tissue as can be seen with gynecomastia. There is no axillary, hilar, or mediastinal adenopathy. There is no lytic or blastic osseous lesion. The visualized portions of the upper abdomen are unremarkable. Review of the MIP images confirms the above findings. IMPRESSION: 1. Acute bilateral pulmonary emboli. Critical Value/emergent results were called by telephone at the time of interpretation on 03/27/2015 at 2:13 pm to Dr. Lenise Arena , who verbally acknowledged these results. Electronically Signed   By: Kathreen Devoid   On: 03/27/2015 14:16   US Venous Img Lower Unilateral Left  03/27/2015  CLINICAL DATA:  Left lower extremity pain and edema. History of knee surgery on 09/2014 with subsequent manipulation on 12/2014. History of pulmonary embolism. EXAM: LEFT LOWER EXTREMITY VENOUS DOPPLER ULTRASOUND TECHNIQUE: Gray-scale sonography with graded compression, as well as color Doppler and duplex ultrasound were performed to evaluate the lower extremity deep venous systems from the level of the common femoral vein and including the common femoral, femoral, profunda femoral, popliteal and calf veins including the posterior tibial, peroneal and gastrocnemius veins when visible. The superficial great saphenous vein was also interrogated. Spectral Doppler was utilized to evaluate flow at rest and with distal augmentation maneuvers in the common femoral, femoral and popliteal veins. COMPARISON:  Chest CTA - earlier same day FINDINGS: Contralateral Common Femoral Vein: Respiratory phasicity is normal and symmetric with the symptomatic side. No evidence of thrombus. Normal  compressibility. Common Femoral Vein: No evidence of thrombus. Normal compressibility, respiratory phasicity and response to augmentation. Saphenofemoral Junction: No evidence of thrombus. Normal compressibility and flow on color Doppler imaging. Profunda Femoral Vein: No evidence of thrombus. Normal compressibility and flow on color Doppler imaging. Femoral Vein: There is a minimal amount of hypoechoic occlusive thrombus within the distal aspect of the left femoral vein (represent images 14 and 30). Popliteal Vein: There is hypoechoic expansile thrombus throughout the imaged course of the left popliteal vein (image 15, 16, 31 and 34). Calf Veins: Suboptimally visualized. Superficial Great Saphenous Vein: No evidence of thrombus. Normal compressibility and flow on color Doppler imaging. Venous Reflux:  None. Other Findings:  None. IMPRESSION: The examination is positive for occlusive DVT within the distal aspect of the left femoral vein extending through the imaged course of the left popliteal vein. Electronically Signed   By: Sandi Mariscal M.D.   On: 03/27/2015 17:02     ASSESSMENT/PLAN   55 yo obese white male with acute SOB from acute b/L PE with acute  DVT from immobilization with underlying OSA  1.oxygen as needed 2.biPAP and CPAP for OSA as needed 3.continue anticoagulation-on Elliquios 4.pateint with afib with RVR -amiadarone stopped 5.recommend ambulation  OK to transfer to gen med floor today    The Patient requires high complexity decision making for assessment and support, frequent evaluation and titration of therapies, application of advanced monitoring technologies and extensive interpretation of multiple databases.   Patient are satisfied with Plan of action and management. All questions answered  Corrin Parker, M.D.  [facility]  Medical Director [place] Director [facility] Cardio-Pulmonary Department

## 2015-03-31 NOTE — Consult Note (Signed)
ANTICOAGULATION CONSULT NOTE - Follow Up  Pharmacy Consult for Eliquis Indication: pulmonary embolus  Allergies  Allergen Reactions  . Penicillins Other (See Comments)    Reaction:  Unknown     Patient Measurements: Height: 5\' 9"  (175.3 cm) Weight: (!) 303 lb 9.2 oz (137.7 kg) IBW/kg (Calculated) : 70.7  Vital Signs: Temp: 97.6 F (36.4 C) (03/10 0944) Temp Source: Oral (03/10 0944) BP: 146/73 mmHg (03/10 1000) Pulse Rate: 76 (03/10 1000)  Labs:  Recent Labs  03/29/15 0434 03/29/15 1044  03/30/15 0034 03/30/15 0459 03/30/15 1453 03/30/15 2257 03/31/15 0351  HGB 11.9*  --   --   --  12.1*  --   --   --   HCT 36.3*  --   --   --  36.3*  --   --   --   PLT 193  --   --   --  191  --   --   --   APTT  --   --   < > 50* 50* 80* 55*  --   HEPARINUNFRC  --   --   --  2.42* 2.00*  --   --  0.84*  CREATININE 1.00  --   --   --  0.93  --   --   --   TROPONINI  --  <0.03  --  0.03  0.03  --   --   --   --   < > = values in this interval not displayed.  Estimated Creatinine Clearance: 125.2 mL/min (by C-G formula based on Cr of 0.93).   Medical History: Past Medical History  Diagnosis Date  . Obesity   . Hypertensive heart disease   . Chest pain     a. 2015 Abnl MV;  b. 2015 Cath: nl cors.  . Chronic diastolic CHF (congestive heart failure) ([place])     a. 03/2015 Echo: EF 55-65%, poor windows, mildly dil RV.  Marland Kitchen GERD (gastroesophageal reflux disease)   . Arthritis   . Urine incontinence   . Primary localized osteoarthritis of right knee     a. 02/2014 s/p R Partial Knee arthroplasty.  . Primary localized osteoarthritis of left knee     a. 11/2014 s/p L TKA.  Marland Kitchen OSA (obstructive sleep apnea)     a. uses CPAP nightly  . Stiffness of left knee     a. 12/2014 s/p manipulation under anesthesia.  Marland Kitchen DVT (deep venous thrombosis) ([place])     a. 03/2015 U/S: occlusive DVT w/in the distal aspect of the Left fem vein through the L popliteal vein.  . Bilateral pulmonary embolism  ([place])     a. 03/2015 CTA: acute bilat PE.    Medications:  Scheduled:  . amiodarone  200 mg Oral BID  . antiseptic oral rinse  7 mL Mouth Rinse BID  . apixaban  10 mg Oral BID   Followed by  . [START ON 04/07/2015] apixaban  5 mg Oral BID  . aspirin EC  81 mg Oral Daily  . metoprolol succinate  12.5 mg Oral BID  . mometasone-formoterol  2 puff Inhalation BID  . oxyCODONE  5 mg Oral 4 times per day  . pantoprazole  40 mg Oral Daily  . senna-docusate  2 tablet Oral BID   Assessment: MT is a 55 yo male admitted for PE. Pharmacy consulted to dose eliquis after heparin gtt D/C'd.    Plan:  Will D/c heparin and give first apixaban dose at  same time. Will start apixaban 10mg  PO BID x7 days followed by 5mg  PO BID.  Pharmacy will continue to monitor for changes in renal function that would require dose adjustment.  Vena Rua 03/31/2015,11:55 AM

## 2015-03-31 NOTE — Progress Notes (Signed)
Patient Name: Richard [name] Date of Encounter: 03/31/2015  Hospital Problem List     Principal Problem:   Pulmonary embolism without acute cor pulmonale (HCC) Active Problems:   Hypoxia   Peroneal DVT (deep venous thrombosis) (HCC)   PAF (paroxysmal atrial fibrillation) (HCC)   Diastolic dysfunction   Chest pain   Morbid obesity (HCC)   OSA (obstructive sleep apnea)   Swelling   Atrial fibrillation with rapid ventricular response (HCC)    Subjective   No further c/p or Afib.  Resp status stable.  Inpatient Medications    . antiseptic oral rinse  7 mL Mouth Rinse BID  . aspirin EC  81 mg Oral Daily  . ipratropium-albuterol  3 mL Nebulization Q6H  . metoprolol succinate  12.5 mg Oral BID  . oxyCODONE  5 mg Oral 4 times per day  . pantoprazole  40 mg Oral Daily  . senna-docusate  2 tablet Oral BID    Vital Signs    Filed Vitals:   03/31/15 0400 03/31/15 0500 03/31/15 0530 03/31/15 0600  BP: 139/63 143/112 142/90 146/71  Pulse: 73 85 78 79  Temp: 98 F (36.7 C)     TempSrc: Oral     Resp: 18 18 24 14   Height:      Weight:      SpO2: 97% 92% 96% 94%    Intake/Output Summary (Last 24 hours) at 03/31/15 0739 Last data filed at 03/31/15 0643  Gross per 24 hour  Intake 1528.6 ml  Output   2225 ml  Net -696.4 ml   Filed Weights   03/27/15 1250 03/27/15 1824 03/28/15 0900  Weight: 299 lb (135.626 kg) 300 lb 8 oz (136.306 kg) 303 lb 9.2 oz (137.7 kg)    Physical Exam    General: Pleasant, NAD. Neuro: Alert and oriented X 3. Moves all extremities spontaneously. Psych: Normal affect. HEENT:  Normal  Neck: Supple without bruits.  Difficult to gauge JVP 2/2 girth. Lungs:  Resp regular and unlabored, diminished breath sounds bilat. Heart: RRR no s3, s4, or murmurs. Abdomen: Soft, non-tender, non-distended, BS + x 4.  Extremities: No clubbing, cyanosis or edema. DP/PT/Radials 2+ and equal bilaterally.  Labs    CBC  Recent Labs  03/29/15 0434  03/30/15 0459  WBC 9.4 8.5  HGB 11.9* 12.1*  HCT 36.3* 36.3*  MCV 80.3 79.7*  PLT 193 [id number]   Basic Metabolic Panel  Recent Labs  03/28/15 2021 03/29/15 0434 03/30/15 0459  NA  --  137 135  K  --  4.3 3.9  CL  --  101 99*  CO2  --  29 29  GLUCOSE  --  98 116*  BUN  --  21* 16  CREATININE  --  1.00 0.93  CALCIUM  --  8.8* 8.5*  MG 1.9 2.0  --    Cardiac Enzymes  Recent Labs  03/29/15 1044 03/30/15 0034  TROPONINI <0.03 0.03  0.03   Fasting Lipid Panel  Recent Labs  03/29/15 0434  CHOL 124  HDL 30*  LDLCALC 80  TRIG 71  CHOLHDL 4.1   Thyroid Function Tests  Recent Labs  03/30/15 0459  TSH 1.741    Telemetry    RSR  Radiology    Ct Angio Chest Pe W/cm &/or Wo Cm  03/29/2015  CLINICAL DATA:  Acute onset shortness of breath 2 days ago with right chest pain with a diagnosis of pulmonary embolus at that time. Shortness of breath worsened  this morning. Question increased pulmonary embolus burden. Initial encounter. EXAM: CT ANGIOGRAPHY CHEST WITH CONTRAST TECHNIQUE: Multidetector CT imaging of the chest was performed using the standard protocol during bolus administration of intravenous contrast. Multiplanar CT image reconstructions and MIPs were obtained to evaluate the vascular anatomy. CONTRAST:  100 ml OMNIPAQUE IOHEXOL 350 MG/ML SOLN COMPARISON:  CTA chest 03/27/2015. FINDINGS: Bilateral pulmonary emboli are unchanged. The RV to LV ratio is 0.84 which is negative for right heart strain. The patient has a very small right pleural effusion which is new since the prior study. No left pleural effusion is noted. Small mediastinal lymph nodes including a 1.2 cm prevascular node on image 46 and a 1.0 cm right paratracheal node on image 47 are unchanged. The lungs demonstrate scattered atelectatic change. Peripheral opacity in the right middle lobe which could be due to atelectasis or possibly infarct is unchanged in appearance. The lungs are otherwise unremarkable.  Visualized upper abdomen demonstrates fatty infiltration of the liver. Imaged intra-abdominal contents are otherwise unremarkable. The no focal bony abnormality is identified. Review of the MIP images confirms the above findings. IMPRESSION: No change in the appearance of bilateral pulmonary emboli. Negative for right heart strain. Possible small pulmonary infarct right middle lobe is unchanged. New trace right pleural effusion. Fatty infiltration of the liver. Electronically Signed   By: Inge Rise M.D.   On: 03/29/2015 16:58   Ct Angio Chest Pe W/cm &/or Wo Cm  03/27/2015  CLINICAL DATA:  Right-sided chest pain radiating under the right breast EXAM: CT ANGIOGRAPHY CHEST WITH CONTRAST TECHNIQUE: Multidetector CT imaging of the chest was performed using the standard protocol during bolus administration of intravenous contrast. Multiplanar CT image reconstructions and MIPs were obtained to evaluate the vascular anatomy. CONTRAST:  134mL OMNIPAQUE IOHEXOL 350 MG/ML SOLN COMPARISON:  None. FINDINGS: There is adequate opacification of the pulmonary arteries. There is pulmonary embolus in the right main pulmonary artery, right upper lobe lobar artery, left lower lobe and right lower lobe lobar artery and right middle lobe lobar pulmonary artery. The main pulmonary artery, right main pulmonary artery and left main pulmonary arteries are normal in size. The heart size is normal. There is no pericardial effusion. There is right middle lobe airspace disease peripherally which may reflect atelectasis versus pulmonary infarct. There is no pleural effusion or pneumothorax. Bilateral retroareolar fibroglandular tissue as can be seen with gynecomastia. There is no axillary, hilar, or mediastinal adenopathy. There is no lytic or blastic osseous lesion. The visualized portions of the upper abdomen are unremarkable. Review of the MIP images confirms the above findings. IMPRESSION: 1. Acute bilateral pulmonary emboli.  Critical Value/emergent results were called by telephone at the time of interpretation on 03/27/2015 at 2:13 pm to Dr. Lenise Arena , who verbally acknowledged these results. Electronically Signed   By: Kathreen Devoid   On: 03/27/2015 14:16   US Venous Img Lower Unilateral Left  03/27/2015  CLINICAL DATA:  Left lower extremity pain and edema. History of knee surgery on 09/2014 with subsequent manipulation on 12/2014. History of pulmonary embolism. EXAM: LEFT LOWER EXTREMITY VENOUS DOPPLER ULTRASOUND TECHNIQUE: Gray-scale sonography with graded compression, as well as color Doppler and duplex ultrasound were performed to evaluate the lower extremity deep venous systems from the level of the common femoral vein and including the common femoral, femoral, profunda femoral, popliteal and calf veins including the posterior tibial, peroneal and gastrocnemius veins when visible. The superficial great saphenous vein was also interrogated. Spectral Doppler was utilized  to evaluate flow at rest and with distal augmentation maneuvers in the common femoral, femoral and popliteal veins. COMPARISON:  Chest CTA - earlier same day FINDINGS: Contralateral Common Femoral Vein: Respiratory phasicity is normal and symmetric with the symptomatic side. No evidence of thrombus. Normal compressibility. Common Femoral Vein: No evidence of thrombus. Normal compressibility, respiratory phasicity and response to augmentation. Saphenofemoral Junction: No evidence of thrombus. Normal compressibility and flow on color Doppler imaging. Profunda Femoral Vein: No evidence of thrombus. Normal compressibility and flow on color Doppler imaging. Femoral Vein: There is a minimal amount of hypoechoic occlusive thrombus within the distal aspect of the left femoral vein (represent images 14 and 30). Popliteal Vein: There is hypoechoic expansile thrombus throughout the imaged course of the left popliteal vein (image 15, 16, 31 and 34). Calf Veins:  Suboptimally visualized. Superficial Great Saphenous Vein: No evidence of thrombus. Normal compressibility and flow on color Doppler imaging. Venous Reflux:  None. Other Findings:  None. IMPRESSION: The examination is positive for occlusive DVT within the distal aspect of the left femoral vein extending through the imaged course of the left popliteal vein. Electronically Signed   By: Sandi Mariscal M.D.   On: 03/27/2015 17:02    Assessment & Plan    1. Acute Bilateral PE/Left LE DVT: S/P L TKA in November with subsequent swelling and limited mobility s/p manipulation by ortho under anesthesia in late December. Admitted with Right sided c/p and dyspnea  found to have LLE DVT and bilat PE s/p IVC filter and thrombolysis per vascular surgery on 3/8. No c/p overnight. Currently on heparin. -Plan to change to Eliquis 10 mg BID x 7 days, followed by 5 mg bid once cleared by Vascular surgery.   2. Afib RVR:  In setting of #1. IV amio started on 3/8 with initial conversion then PAF overnight 3/8  3/9. No further AFib. CHA2DS2VASc = 1 . -Switch to oral amio today - 200 mg BID.  Hopefully can d/c this in a few wks. -Switch to Eliquis (PE treatment dosing) once ok with vascular surgery. -Cont oral  blocker, which he was on @ home.  -Likely does not require long term anticoagulation for Afib alone.  3. Acute respiratory failure: Resolved.   4. Chronic Diastolic CHF: Nl LV fxn by echo this admission. Euvolemic on exam. HR/BP reasonably stable. No need for diuresis.   5. Hypertensive Heart Disease: Stable.  6. OSA: Uses CPAP.  Signed, Murray Hodgkins NP

## 2015-03-31 NOTE — Progress Notes (Signed)
[facility] [place] NAME: Richard [name]    MR#:  [id number]  DATE OF BIRTH:  08/31/1960  SUBJECTIVE:  CHIEF COMPLAINT:   Chief Complaint  Patient presents with  . Chest Pain  chest pain on right side while breathing. Respiratory distress, was on HFNC oxygen,on 03/29/15 mornign converted to A fib and having RVR up to 150. Later Pulmonary thrombolysis was done by vascular, and IVC filter is placed on 03/29/15.  Now he is much better and HR under control, on 2-3 ltr nasal canula oxygen.   Walked with PT yesterday.  REVIEW OF SYSTEMS:  CONSTITUTIONAL: No fever, has weakness.  EYES: No blurred or double vision.  EARS, NOSE, AND THROAT: No tinnitus or ear pain.  RESPIRATORY: has less cough,less shortness of breath, no wheezing or hemoptysis.  CARDIOVASCULAR: has right-sided chest pain- much less now, no orthopnea,  Left leg edema.  GASTROINTESTINAL: No nausea, vomiting, diarrhea or abdominal pain.  GENITOURINARY: No dysuria, hematuria.  ENDOCRINE: No polyuria, nocturia. HEMATOLOGY: No anemia, easy bruising or bleeding. SKIN: No rash or lesion. MUSCULOSKELETAL: No joint pain or arthritis.   NEUROLOGIC: No tingling, numbness, weakness.  PSYCHIATRY: No anxiety or depression.   DRUG ALLERGIES:   Allergies  Allergen Reactions  . Penicillins Other (See Comments)    Reaction:  Unknown     VITALS:  Blood pressure 143/66, pulse 73, temperature 98 F (36.7 C), temperature source Oral, resp. rate 14, height 5\' 9"  (1.753 m), weight 137.7 kg (303 lb 9.2 oz), SpO2 96 %.  PHYSICAL EXAMINATION:  GENERAL:  55 y.o.-year-old patient lying in the bed with no acute distress. Obese. EYES: Pupils equal, round, reactive to light and accommodation. No scleral icterus. Extraocular muscles intact.  HEENT: Head atraumatic, normocephalic. Oropharynx and nasopharynx clear.  NECK:  Supple, no jugular venous distention. No thyroid enlargement, no tenderness.   LUNGS: Normal breath sounds bilaterally, no wheezing, rales,rhonchi or crepitation. No use of accessory muscles of respiration. On  [place]. CARDIOVASCULAR: S1, S2 regular. No murmurs, rubs, or gallops.  ABDOMEN: Soft, nontender, nondistended. Bowel sounds present. No organomegaly or mass.  EXTREMITIES: trace edema on left leg, no cyanosis, or clubbing. Left leg swollen. NEUROLOGIC: Cranial nerves II through XII are intact. Muscle strength 5/5 in all extremities. Sensation intact. Gait not checked.  PSYCHIATRIC: The patient is alert and oriented x 3.   SKIN: No obvious rash, lesion, or ulcer.    LABORATORY PANEL:   CBC  Recent Labs Lab 03/30/15 0459  WBC 8.5  HGB 12.1*  HCT 36.3*  PLT 191   ------------------------------------------------------------------------------------------------------------------  Chemistries   Recent Labs Lab 03/29/15 0434 03/30/15 0459  NA 137 135  K 4.3 3.9  CL 101 99*  CO2 29 29  GLUCOSE 98 116*  BUN 21* 16  CREATININE 1.00 0.93  CALCIUM 8.8* 8.5*  MG 2.0  --    ------------------------------------------------------------------------------------------------------------------  Cardiac Enzymes  Recent Labs Lab 03/30/15 0034  TROPONINI 0.03  0.03   ------------------------------------------------------------------------------------------------------------------  RADIOLOGY:  Ct Angio Chest Pe W/cm &/or Wo Cm  03/29/2015  CLINICAL DATA:  Acute onset shortness of breath 2 days ago with right chest pain with a diagnosis of pulmonary embolus at that time. Shortness of breath worsened this morning. Question increased pulmonary embolus burden. Initial encounter. EXAM: CT ANGIOGRAPHY CHEST WITH CONTRAST TECHNIQUE: Multidetector CT imaging of the chest was performed using the standard protocol during bolus administration of intravenous contrast. Multiplanar CT image reconstructions and MIPs were  obtained to evaluate the vascular anatomy. CONTRAST:  100  ml OMNIPAQUE IOHEXOL 350 MG/ML SOLN COMPARISON:  CTA chest 03/27/2015. FINDINGS: Bilateral pulmonary emboli are unchanged. The RV to LV ratio is 0.84 which is negative for right heart strain. The patient has a very small right pleural effusion which is new since the prior study. No left pleural effusion is noted. Small mediastinal lymph nodes including a 1.2 cm prevascular node on image 46 and a 1.0 cm right paratracheal node on image 47 are unchanged. The lungs demonstrate scattered atelectatic change. Peripheral opacity in the right middle lobe which could be due to atelectasis or possibly infarct is unchanged in appearance. The lungs are otherwise unremarkable. Visualized upper abdomen demonstrates fatty infiltration of the liver. Imaged intra-abdominal contents are otherwise unremarkable. The no focal bony abnormality is identified. Review of the MIP images confirms the above findings. IMPRESSION: No change in the appearance of bilateral pulmonary emboli. Negative for right heart strain. Possible small pulmonary infarct right middle lobe is unchanged. New trace right pleural effusion. Fatty infiltration of the liver. Electronically Signed   By: Inge Rise M.D.   On: 03/29/2015 16:58     ASSESSMENT AND PLAN:   * Acute bilateral pulmonary emboli   Given heparin IV drip, fu/ echocardiogram.   Thrombolysis by vascular 03/29/15- now much better.    Vascular suggested to start eliquis today.  * Acute respiratory failure with hypoxia and hypercapnia, due to pulmonary emboli and OSA,   was on BIPAP , now on nasal canula,  NEB prn.  a repeat CT angio to check for clot burden.- it showed same clot.   Vascular did thrombolysis and IVC filter- as pt was having worsening symptoms.   Much improved after that, on 2 ltr nasal canula- switch back to eliquis.  * A fib with RVR   Called urgent cardio consult- started on amio drip.   HR stable now.   Switching to amio oral today.  * Left leg DVT.   On  eliquis.   S/p IVC filter  * Hypertension Continue metoprolol.  * Left ventricular hypertrophy Continue Lasix.  * Chest pain This is secondary to pulmonary emboli, Dilaudid PCA.   Much better now- on dilaudid PRN.  I discussed with Dr. Mortimer Fries, Dr. Rockey Situ, Dr. Carmin Richmond All the records are reviewed and case discussed with Care Management/Social Workerr. Management plans discussed with the patient, family and they are in agreement.  CODE STATUS: Full code.  TOTAL TIME TAKING CARE OF THIS PATIENT: 30 minutes.  Greater than 50% time was spent on coordination of care and face-to-face counseling.  POSSIBLE D/C IN 1 DAYS, DEPENDING ON CLINICAL CONDITION. Will Tx to floor today, get PT and possible d/c tomorrow.  Vaughan Basta M.D on 03/31/2015 at 8:54 AM  Between 7am to 6pm - Pager - [phone]  After 6pm go to [email or web address] - password [email or web address] [facility]  [facility]  Office  [phone]  CC: Primary care physician; Webb Silversmith, NP

## 2015-03-31 NOTE — Progress Notes (Signed)
ANTICOAGULATION CONSULT NOTE -FOLLOW UP   Pharmacy Consult for heparin drip Indication: pulmonary embolus  Allergies  Allergen Reactions  . Penicillins Other (See Comments)    Reaction:  Unknown     Patient Measurements: Height: 5\' 9"  (175.3 cm) Weight: (!) 303 lb 9.2 oz (137.7 kg) IBW/kg (Calculated) : 70.7 Heparin Dosing Weight: 103 kg  Vital Signs: Temp: 98 F (36.7 C) (03/10 0400) Temp Source: Oral (03/10 0400) BP: 146/71 mmHg (03/10 0600) Pulse Rate: 79 (03/10 0600)  Labs:  Recent Labs  03/29/15 0434 03/29/15 1044  03/30/15 0034 03/30/15 0459 03/30/15 1453 03/30/15 2257 03/31/15 0351  HGB 11.9*  --   --   --  12.1*  --   --   --   HCT 36.3*  --   --   --  36.3*  --   --   --   PLT 193  --   --   --  191  --   --   --   APTT  --   --   < > 50* 50* 80* 55*  --   HEPARINUNFRC  --   --   --  2.42* 2.00*  --   --  0.84*  CREATININE 1.00  --   --   --  0.93  --   --   --   TROPONINI  --  <0.03  --  0.03  0.03  --   --   --   --   < > = values in this interval not displayed.  Estimated Creatinine Clearance: 125.2 mL/min (by C-G formula based on Cr of 0.93).   Medical History: Past Medical History  Diagnosis Date  . Obesity   . Hypertensive heart disease   . Chest pain     a. 2015 Abnl MV;  b. 2015 Cath: nl cors.  . Chronic diastolic CHF (congestive heart failure) ([place])     a. 03/2015 Echo: EF 55-65%, poor windows, mildly dil RV.  Marland Kitchen GERD (gastroesophageal reflux disease)   . Arthritis   . Urine incontinence   . Primary localized osteoarthritis of right knee     a. 02/2014 s/p R Partial Knee arthroplasty.  . Primary localized osteoarthritis of left knee     a. 11/2014 s/p L TKA.  Marland Kitchen OSA (obstructive sleep apnea)     a. uses CPAP nightly  . Stiffness of left knee     a. 12/2014 s/p manipulation under anesthesia.  Marland Kitchen DVT (deep venous thrombosis) ([place])     a. 03/2015 U/S: occlusive DVT w/in the distal aspect of the Left fem vein through the L popliteal vein.   . Bilateral pulmonary embolism ([place])     a. 03/2015 CTA: acute bilat PE.    Medications:  Scheduled:  . antiseptic oral rinse  7 mL Mouth Rinse BID  . aspirin EC  81 mg Oral Daily  . ipratropium-albuterol  3 mL Nebulization Q6H  . metoprolol succinate  12.5 mg Oral BID  . oxyCODONE  5 mg Oral 4 times per day  . pantoprazole  40 mg Oral Daily  . senna-docusate  2 tablet Oral BID   Infusions:  . amiodarone 30 mg/hr (03/30/15 2157)  . heparin 1,850 Units/hr (03/30/15 2157)    Assessment: Pharmacy consulted to initiate heparin drip in a 55 yo male with pulmonary embolism.  Patient previously receiving apixaban 10 mg po BID for PE treatment.  Last dose of apixaban 10 mg 3/8 at 1056. Patient currently receiving  heparin 1850units/hr.   Est CrCl~115 mL/min, Baseline aPTT and HL pending  Goal of Therapy:  HL of 0.3-0.7 APTT: 66-102 s Monitor CBC daily   Plan:   Patient's APTT is below goal and heparin level is elevated as expected due to apixaban which falsely elevates heparin levels. Will increase heparin gtt to 1950 units/hr and will order another APTT for 6 hours. Will follow up and adjust as necessary.   Sayge Brienza D 03/31/2015,7:37 AM

## 2015-04-01 LAB — BASIC METABOLIC PANEL
Anion gap: 9 (ref 5–15)
BUN: 14 mg/dL (ref 6–20)
CO2: 28 mmol/L (ref 22–32)
Calcium: 9.4 mg/dL (ref 8.9–10.3)
Chloride: 100 mmol/L — ABNORMAL LOW (ref 101–111)
Creatinine, Ser: 1 mg/dL (ref 0.61–1.24)
GFR calc Af Amer: >60 mL/min (ref >60)
GFR calc non Af Amer: >60 mL/min (ref >60)
Glucose, Bld: 94 mg/dL (ref 65–99)
Potassium: 3.9 mmol/L (ref 3.5–5.1)
Sodium: 137 mmol/L (ref 135–145)

## 2015-04-01 LAB — CBC
HCT: 36.9 % — ABNORMAL LOW (ref 40.0–52.0)
Hemoglobin: 12.3 g/dL — ABNORMAL LOW (ref 13.0–18.0)
MCH: 26.9 pg (ref 26.0–34.0)
MCHC: 33.4 g/dL (ref 32.0–36.0)
MCV: 80.5 fL (ref 80.0–100.0)
Platelets: 215 10*3/uL (ref 150–440)
RBC: 4.58 MIL/uL (ref 4.40–5.90)
RDW: 15.2 % — ABNORMAL HIGH (ref 11.5–14.5)
WBC: 7.3 10*3/uL (ref 3.8–10.6)

## 2015-04-01 MED ORDER — AMIODARONE HCL 200 MG PO TABS
200.0000 mg | ORAL_TABLET | Freq: Once | ORAL | Status: AC
  Administered 2015-04-01: 200 mg via ORAL
  Filled 2015-04-01: qty 1

## 2015-04-01 MED ORDER — AMIODARONE HCL 200 MG PO TABS
400.0000 mg | ORAL_TABLET | Freq: Two times a day (BID) | ORAL | Status: DC
  Administered 2015-04-01 – 2015-04-03 (×4): 400 mg via ORAL
  Filled 2015-04-01 (×4): qty 2

## 2015-04-01 MED ORDER — METOPROLOL SUCCINATE ER 25 MG PO TB24
25.0000 mg | ORAL_TABLET | Freq: Two times a day (BID) | ORAL | Status: DC
  Administered 2015-04-01 – 2015-04-02 (×3): 25 mg via ORAL
  Filled 2015-04-01 (×3): qty 1

## 2015-04-01 NOTE — Progress Notes (Signed)
Patient Name: Richard [name] Date of Encounter: 04/01/2015  Hospital Problem List     Principal Problem:   Pulmonary embolism without acute cor pulmonale (HCC) Active Problems:   Morbid obesity (HCC)   OSA (obstructive sleep apnea)   Diastolic dysfunction   Chest pain   Hypoxia   Peroneal DVT (deep venous thrombosis) (HCC)   Swelling   PAF (paroxysmal atrial fibrillation) (HCC)   Atrial fibrillation with rapid ventricular response (HCC)    Subjective   Continued paroxysmal atrial fibrillation, SOB on exertion,  No leg edema   ROS General: No chills, fever, night sweats or weight changes.  Cardiovascular: improved right sided chest pain and dyspnea on exertion, no LLE/knee edema, no orthopnea, palpitations, paroxysmal nocturnal dyspnea. Dermatological: No rash, lesions/masses Respiratory: No cough, mild dyspnea Urologic: No hematuria, dysuria Abdominal: No nausea, vomiting, diarrhea, bright red blood per rectum, melena, or hematemesis Neurologic: No visual changes, wkns, changes in mental status. All other systems reviewed and are otherwise negative except as noted above.  Inpatient Medications    . amiodarone  200 mg Oral Once  . amiodarone  400 mg Oral BID  . antiseptic oral rinse  7 mL Mouth Rinse BID  . apixaban  10 mg Oral BID   Followed by  . [START ON 04/07/2015] apixaban  5 mg Oral BID  . aspirin EC  81 mg Oral Daily  . metoprolol succinate  25 mg Oral BID  . mometasone-formoterol  2 puff Inhalation BID  . oxyCODONE  5 mg Oral 4 times per day  . pantoprazole  40 mg Oral Daily  . senna-docusate  2 tablet Oral BID    Vital Signs    Filed Vitals:   03/31/15 1450 03/31/15 2029 03/31/15 2350 04/01/15 0448  BP: 117/59 143/66  155/72  Pulse: 73 81  78  Temp: 98.4 F (36.9 C) 99.1 F (37.3 C)  98.2 F (36.8 C)  TempSrc: Oral Oral  Oral  Resp: 16 19  20   Height:      Weight:      SpO2: 93% 95% 95% 95%    Intake/Output Summary (Last 24  hours) at 04/01/15 1240 Last data filed at 04/01/15 0900  Gross per 24 hour  Intake      0 ml  Output   1275 ml  Net  -1275 ml   Filed Weights   03/27/15 1250 03/27/15 1824 03/28/15 0900  Weight: 299 lb (135.626 kg) 300 lb 8 oz (136.306 kg) 303 lb 9.2 oz (137.7 kg)    Physical Exam    General: Pleasant, NAD. Neuro: Alert and oriented X 3. Moves all extremities spontaneously. Psych: Normal affect. HEENT:  Normal  Neck: Supple without bruits.  Difficult to gauge JVP 2/2 girth. Lungs:  Resp regular and unlabored, diminished breath sounds bilat. Heart: RRR no s3, s4, or murmurs. Abdomen: Soft, non-tender, non-distended, BS + x 4.  Extremities: No clubbing, cyanosis or edema. DP/PT/Radials 2+ and equal bilaterally.  Labs    CBC  Recent Labs  03/30/15 0459 04/01/15 0611  WBC 8.5 7.3  HGB 12.1* 12.3*  HCT 36.3* 36.9*  MCV 79.7* 80.5  PLT 191 [id number]   Basic Metabolic Panel  Recent Labs  03/30/15 0459 04/01/15 0611  NA 135 137  K 3.9 3.9  CL 99* 100*  CO2 29 28  GLUCOSE 116* 94  BUN 16 14  CREATININE 0.93 1.00  CALCIUM 8.5* 9.4   Cardiac Enzymes  Recent Labs  03/30/15 0034  TROPONINI 0.03  0.03   Fasting Lipid Panel No results for input(s): CHOL, HDL, LDLCALC, TRIG, CHOLHDL, LDLDIRECT in the last 72 hours. Thyroid Function Tests  Recent Labs  03/30/15 0459  TSH 1.741    Telemetry    Atrial fibrillation, paroxsymal, this am, now back to [facility]  Radiology    Ct Angio Chest Pe W/cm &/or Wo Cm  03/29/2015  CLINICAL DATA:  Acute onset shortness of breath 2 days ago with right chest pain with a diagnosis of pulmonary embolus at that time. Shortness of breath worsened this morning. Question increased pulmonary embolus burden. Initial encounter. EXAM: CT ANGIOGRAPHY CHEST WITH CONTRAST TECHNIQUE: Multidetector CT imaging of the chest was performed using the standard protocol during bolus administration of intravenous contrast. Multiplanar CT image  reconstructions and MIPs were obtained to evaluate the vascular anatomy. CONTRAST:  100 ml OMNIPAQUE IOHEXOL 350 MG/ML SOLN COMPARISON:  CTA chest 03/27/2015. FINDINGS: Bilateral pulmonary emboli are unchanged. The RV to LV ratio is 0.84 which is negative for right heart strain. The patient has a very small right pleural effusion which is new since the prior study. No left pleural effusion is noted. Small mediastinal lymph nodes including a 1.2 cm prevascular node on image 46 and a 1.0 cm right paratracheal node on image 47 are unchanged. The lungs demonstrate scattered atelectatic change. Peripheral opacity in the right middle lobe which could be due to atelectasis or possibly infarct is unchanged in appearance. The lungs are otherwise unremarkable. Visualized upper abdomen demonstrates fatty infiltration of the liver. Imaged intra-abdominal contents are otherwise unremarkable. The no focal bony abnormality is identified. Review of the MIP images confirms the above findings. IMPRESSION: No change in the appearance of bilateral pulmonary emboli. Negative for right heart strain. Possible small pulmonary infarct right middle lobe is unchanged. New trace right pleural effusion. Fatty infiltration of the liver. Electronically Signed   By: Inge Rise M.D.   On: 03/29/2015 16:58   Ct Angio Chest Pe W/cm &/or Wo Cm  03/27/2015  CLINICAL DATA:  Right-sided chest pain radiating under the right breast EXAM: CT ANGIOGRAPHY CHEST WITH CONTRAST TECHNIQUE: Multidetector CT imaging of the chest was performed using the standard protocol during bolus administration of intravenous contrast. Multiplanar CT image reconstructions and MIPs were obtained to evaluate the vascular anatomy. CONTRAST:  137mL OMNIPAQUE IOHEXOL 350 MG/ML SOLN COMPARISON:  None. FINDINGS: There is adequate opacification of the pulmonary arteries. There is pulmonary embolus in the right main pulmonary artery, right upper lobe lobar artery, left lower lobe  and right lower lobe lobar artery and right middle lobe lobar pulmonary artery. The main pulmonary artery, right main pulmonary artery and left main pulmonary arteries are normal in size. The heart size is normal. There is no pericardial effusion. There is right middle lobe airspace disease peripherally which may reflect atelectasis versus pulmonary infarct. There is no pleural effusion or pneumothorax. Bilateral retroareolar fibroglandular tissue as can be seen with gynecomastia. There is no axillary, hilar, or mediastinal adenopathy. There is no lytic or blastic osseous lesion. The visualized portions of the upper abdomen are unremarkable. Review of the MIP images confirms the above findings. IMPRESSION: 1. Acute bilateral pulmonary emboli. Critical Value/emergent results were called by telephone at the time of interpretation on 03/27/2015 at 2:13 pm to Dr. Lenise Arena , who verbally acknowledged these results. Electronically Signed   By: Kathreen Devoid   On: 03/27/2015 14:16   US Venous Img Lower Unilateral Left  03/27/2015  CLINICAL  DATA:  Left lower extremity pain and edema. History of knee surgery on 09/2014 with subsequent manipulation on 12/2014. History of pulmonary embolism. EXAM: LEFT LOWER EXTREMITY VENOUS DOPPLER ULTRASOUND TECHNIQUE: Gray-scale sonography with graded compression, as well as color Doppler and duplex ultrasound were performed to evaluate the lower extremity deep venous systems from the level of the common femoral vein and including the common femoral, femoral, profunda femoral, popliteal and calf veins including the posterior tibial, peroneal and gastrocnemius veins when visible. The superficial great saphenous vein was also interrogated. Spectral Doppler was utilized to evaluate flow at rest and with distal augmentation maneuvers in the common femoral, femoral and popliteal veins. COMPARISON:  Chest CTA - earlier same day FINDINGS: Contralateral Common Femoral Vein: Respiratory  phasicity is normal and symmetric with the symptomatic side. No evidence of thrombus. Normal compressibility. Common Femoral Vein: No evidence of thrombus. Normal compressibility, respiratory phasicity and response to augmentation. Saphenofemoral Junction: No evidence of thrombus. Normal compressibility and flow on color Doppler imaging. Profunda Femoral Vein: No evidence of thrombus. Normal compressibility and flow on color Doppler imaging. Femoral Vein: There is a minimal amount of hypoechoic occlusive thrombus within the distal aspect of the left femoral vein (represent images 14 and 30). Popliteal Vein: There is hypoechoic expansile thrombus throughout the imaged course of the left popliteal vein (image 15, 16, 31 and 34). Calf Veins: Suboptimally visualized. Superficial Great Saphenous Vein: No evidence of thrombus. Normal compressibility and flow on color Doppler imaging. Venous Reflux:  None. Other Findings:  None. IMPRESSION: The examination is positive for occlusive DVT within the distal aspect of the left femoral vein extending through the imaged course of the left popliteal vein. Electronically Signed   By: Sandi Mariscal M.D.   On: 03/27/2015 17:02    Assessment & Plan    1. Acute Bilateral PE/Left LE DVT: S/P L TKA in November with subsequent swelling and limited mobility s/p manipulation by ortho under anesthesia in late December. Admitted with Right sided c/p and dyspnea  found to have LLE DVT and bilat PE s/p IVC filter and thrombolysis per vascular surgery on 3/8.  --- On  Eliquis 10 mg BID x 7 days, followed by 5 mg bid   2. Afib RVR:  In setting of #1. IV amio started on 3/8 with initial conversion then PAF overnight 3/8  3/9. No further AFib. CHA2DS2VASc = 1 . Continued paroxysmal atrial fib, ---will increase amiodarone up to 400 mg po BID x 5 days, then down to 200 mg daily -Cont oral  blocker  3. Acute respiratory failure: Improved, Does not need lasix at this  time  4. Chronic Diastolic CHF: Nl LV fxn by echo this admission. Euvolemic on exam.  5. Hypertensive Heart Disease: Stable.  6. OSA: Uses CPAP.  Signed, Esmond Plants, MD, Ph.D [facility]

## 2015-04-01 NOTE — Progress Notes (Signed)
Physical Therapy Treatment Patient Details Name: Richard [name] MRN: [id number] DOB: 1960/05/26 Today's Date: 04/01/2015    History of Present Illness Richard [name]  is a 55 y.o. male with a known history of hypertension, chest pain, left ventricular hypertrophic, urinary incontinence, obstructive sleep apnea on CPAP use, bilateral knee arthritis status post surgeries. For last few days he is feeling little bit more short of breath and tired, followed by pain in his right side of the chest which is sharp and stabbing and pt unable to take a deep breath. Noted to have bilateral pulmonary emboli in ER and requiring IV pain medication to control the pain, also slightly hypoxic and requiring oxygen. He denies any past history of blood clots, he had bilateral knee surgeries done in the last year and he was given a short course of oral anticoagulation after the surgery as a prevention. Patient has undergone successful thrombectomy of right pulmonary embolism and placement of an IVC filter. He is now on heparin and we will transition back to Eliquis once we see how well he is tolerating his left leg DVT. At time of PT evaluation pt is eager to ambulate. [name] vascular and intensivists' notes recommend mobilization of patient. Discussion with intensivist to ensure pt is safe to begin ambulation. Pt denies falls in the last 12 months. Denies SOB upon arrival. No specific questions or concerns.     PT Comments    Pt shows good effort with all acts and is eager to work with PT.  He has better confidence and cadence with ambulation today and though he is R knee remains stiff and lacks full ROM in [name] flexion and extension he tolerates extensive manual techniques to try to work ROM.  Educated on proper positioning and home activity.   Follow Up Recommendations  Home health PT     Equipment Recommendations  None recommended by PT    Recommendations for Other Services       Precautions / Restrictions  Precautions Precautions: Fall Restrictions Weight Bearing Restrictions: No    Mobility  Bed Mobility Overal bed mobility: Independent                Transfers Overall transfer level: Independent                  Ambulation/Gait Ambulation/Gait assistance: Supervision Ambulation Distance (Feet): 300 Feet Assistive device: Rolling walker (2 wheeled)       General Gait Details: Pt with increased speed and confidence in R LE today.  He is better able to attempt active TKE and though he has some R great toe pain he is able to ambulate w/o hesitation. Pt''s heart rate increases as high as 110 during ambulation, but he does not have excessive fatigue.    Stairs            Wheelchair Mobility    Modified Rankin (Stroke Patients Only)       Balance                                    Cognition Arousal/Alertness: Awake/alert Behavior During Therapy: WFL for tasks assessed/performed Overall Cognitive Status: Within Functional Limits for tasks assessed                      Exercises General Exercises - Lower Extremity Quad Sets: Strengthening;[name];10 reps;Supine Gluteal Sets: Strengthening;[name];10 reps;Supine Short Arc Quad: Strengthening;Left;10 reps;Supine  Heel Slides: PROM;Strengthening;10 reps Hip ABduction/ADduction: Strengthening;[name];10 reps;Supine Straight Leg Raises: Strengthening;[name];10 reps;Supine Other Exercises Other Exercises: extensive pulsed and sustained overpressure manual therapy for knee extension and flexion.  Pt with pain, but good tolerance and willingness to push during these acts.     General Comments        Pertinent Vitals/Pain Pain Score: 5  (R great toe, no knee pain except with manual therapy)    Home Living                      Prior Function            PT Goals (current goals can now be found in the care plan section) Progress towards PT goals: Progressing toward goals     Frequency  Min 2X/week    PT Plan Current plan remains appropriate    Co-evaluation             End of Session Equipment Utilized During Treatment: Gait belt Activity Tolerance: Patient tolerated treatment well Patient left: with bed alarm set;with family/visitor present;with call bell/phone within reach     Time: [id number] PT Time Calculation (min) (ACUTE ONLY): 31 min  Charges:  $Gait Training: 8-22 mins $Therapeutic Exercise: 8-22 mins                    G Codes:     Richard [name], PT, DPT [phone]  Richard [name] 04/01/2015, 5:44 PM

## 2015-04-01 NOTE — Progress Notes (Signed)
Pt placed on home CPAP. Tolerating well. CPAP plugged into red outlet. No evidence of damage to machine when setup

## 2015-04-01 NOTE — Progress Notes (Signed)
Initial Nutrition Assessment    INTERVENTION:  Meals and snacks: recommend discontinuing carb modified restriction as pt not diabetic and blood glucose WDL Nutrition diet education: Discussed heart healthy diet with pt and wife at bedside.  Pt verbalized understanding and has been cutting back on foods. Expect good compliance   NUTRITION DIAGNOSIS:    (none at this time) related to   as evidenced by  .    GOAL:   Patient will meet greater than or equal to 90% of their needs    MONITOR:    (Energy intake)  REASON FOR ASSESSMENT:   LOS    ASSESSMENT:      Pt admitted with shortness of breath secondary to acute bilateral PE and DVT, IVC filter placed on 3/8, recent TKR  Past Medical History  Diagnosis Date  . Obesity   . Hypertensive heart disease   . Chest pain     a. 2015 Abnl MV;  b. 2015 Cath: nl cors.  . Chronic diastolic CHF (congestive heart failure) ([place])     a. 03/2015 Echo: EF 55-65%, poor windows, mildly dil RV.  Marland Kitchen GERD (gastroesophageal reflux disease)   . Arthritis   . Urine incontinence   . Primary localized osteoarthritis of right knee     a. 02/2014 s/p R Partial Knee arthroplasty.  . Primary localized osteoarthritis of left knee     a. 11/2014 s/p L TKA.  Marland Kitchen OSA (obstructive sleep apnea)     a. uses CPAP nightly  . Stiffness of left knee     a. 12/2014 s/p manipulation under anesthesia.  Marland Kitchen DVT (deep venous thrombosis) ([place])     a. 03/2015 U/S: occlusive DVT w/in the distal aspect of the Left fem vein through the L popliteal vein.  . Bilateral pulmonary embolism ([place])     a. 03/2015 CTA: acute bilat PE.    Current Nutrition: eating 75-100% of meals  Food/Nutrition-Related History: reports has been cutting back on intake prior to admission with effort to lose wt   Scheduled Medications:  . amiodarone  400 mg Oral BID  . antiseptic oral rinse  7 mL Mouth Rinse BID  . apixaban  10 mg Oral BID   Followed by  . [START ON 04/07/2015] apixaban  5  mg Oral BID  . aspirin EC  81 mg Oral Daily  . metoprolol succinate  25 mg Oral BID  . mometasone-formoterol  2 puff Inhalation BID  . oxyCODONE  5 mg Oral 4 times per day  . pantoprazole  40 mg Oral Daily  . senna-docusate  2 tablet Oral BID         Electrolyte/Renal Profile and Glucose Profile:   Recent Labs Lab 03/28/15 2021 03/29/15 0434 03/30/15 0459 04/01/15 0611  NA  --  137 135 137  K  --  4.3 3.9 3.9  CL  --  101 99* 100*  CO2  --  29 29 28   BUN  --  21* 16 14  CREATININE  --  1.00 0.93 1.00  CALCIUM  --  8.8* 8.5* 9.4  MG 1.9 2.0  --   --   GLUCOSE  --  98 116* 94    Gastrointestinal Profile: Last BM:3/11     Weight Change: stable wt    Diet Order:  Diet Heart Room service appropriate?: Yes; Fluid consistency:: Thin  Skin:   reviewed   Height:   Ht Readings from Last 1 Encounters:  03/28/15 5\' 9"  (1.753 m)  Weight:   Wt Readings from Last 1 Encounters:  03/28/15 303 lb 9.2 oz (137.7 kg)    Ideal Body Weight:     BMI:  Body mass index is 44.81 kg/(m^2).    EDUCATION NEEDS:   Education needs addressed  LOW Care Level  Richard [name] B. Richard [name], [place], [place] (pager) Weekend/On-Call pager [phone])

## 2015-04-02 DIAGNOSIS — I4891 Unspecified atrial fibrillation: Secondary | ICD-10-CM

## 2015-04-02 MED ORDER — METOPROLOL SUCCINATE ER 50 MG PO TB24
50.0000 mg | ORAL_TABLET | Freq: Two times a day (BID) | ORAL | Status: DC
  Administered 2015-04-02 – 2015-04-03 (×2): 50 mg via ORAL
  Filled 2015-04-02 (×2): qty 1

## 2015-04-02 NOTE — Progress Notes (Signed)
Pt placed on home CPAP for sleep. Tolerating well. CPAP plugged into red outlet

## 2015-04-02 NOTE — Care Management (Signed)
Patient has been given coupons for eliquis

## 2015-04-02 NOTE — Progress Notes (Signed)
Richard [name] at [place] NAME: Richard [name]    MR#:  [id number]  DATE OF BIRTH:  11/05/1960  SUBJECTIVE:  CHIEF COMPLAINT:   Chief Complaint  Patient presents with  . Chest Pain  chest pain on right side while breathing. Respiratory distress, was on HFNC oxygen,on 03/29/15 mornign converted to A fib and having RVR up to 150. Later Pulmonary thrombolysis was done by vascular, and IVC filter is placed on 03/29/15.  Now he is much better , On room air, still have some stiffness on left leg and knee.   On minimal walking today - he had episodes of A fib with rate up to 140-150.   Increased dose of amio on 04/01/15, still had epsides of spontaneous a fib with RVR today.   REVIEW OF SYSTEMS:  CONSTITUTIONAL: No fever, has weakness.  EYES: No blurred or double vision.  EARS, NOSE, AND THROAT: No tinnitus or ear pain.  RESPIRATORY: has less cough,less shortness of breath, no wheezing or hemoptysis.  CARDIOVASCULAR: has right-sided chest pain- much less now, no orthopnea,  Left leg edema.  GASTROINTESTINAL: No nausea, vomiting, diarrhea or abdominal pain.  GENITOURINARY: No dysuria, hematuria.  ENDOCRINE: No polyuria, nocturia. HEMATOLOGY: No anemia, easy bruising or bleeding. SKIN: No rash or lesion. MUSCULOSKELETAL: No joint pain or arthritis.   NEUROLOGIC: No tingling, numbness, weakness.  PSYCHIATRY: No anxiety or depression.   DRUG ALLERGIES:   Allergies  Allergen Reactions  . Penicillins Other (See Comments)    Reaction:  Unknown     VITALS:  Blood pressure 133/75, pulse 74, temperature 98.2 F (36.8 C), temperature source Oral, resp. rate 20, height 5\' 9"  (1.753 m), weight 137.7 kg (303 lb 9.2 oz), SpO2 97 %.  PHYSICAL EXAMINATION:  GENERAL:  55 y.o.-year-old patient lying in the bed with no acute distress. Obese. EYES: Pupils equal, round, reactive to light and accommodation. No scleral icterus. Extraocular muscles intact.   HEENT: Head atraumatic, normocephalic. Oropharynx and nasopharynx clear.  NECK:  Supple, no jugular venous distention. No thyroid enlargement, no tenderness.  LUNGS: Normal breath sounds bilaterally, no wheezing, rales,rhonchi or crepitation. No use of accessory muscles of respiration.  CARDIOVASCULAR: S1, S2 regular. No murmurs, rubs, or gallops.  ABDOMEN: Soft, nontender, nondistended. Bowel sounds present. No organomegaly or mass.  EXTREMITIES: trace edema on left leg, no cyanosis, or clubbing. Left leg swollen. NEUROLOGIC: Cranial nerves II through XII are intact. Muscle strength 5/5 in all extremities. Sensation intact. Gait not checked.  PSYCHIATRIC: The patient is alert and oriented x 3.   SKIN: No obvious rash, lesion, or ulcer.    LABORATORY PANEL:   CBC  Recent Labs Lab 04/01/15 0611  WBC 7.3  HGB 12.3*  HCT 36.9*  PLT 215   ------------------------------------------------------------------------------------------------------------------  Chemistries   Recent Labs Lab 03/29/15 0434  04/01/15 0611  NA 137  < > 137  K 4.3  < > 3.9  CL 101  < > 100*  CO2 29  < > 28  GLUCOSE 98  < > 94  BUN 21*  < > 14  CREATININE 1.00  < > 1.00  CALCIUM 8.8*  < > 9.4  MG 2.0  --   --   < > = values in this interval not displayed. ------------------------------------------------------------------------------------------------------------------  Cardiac Enzymes  Recent Labs Lab 03/30/15 0034  TROPONINI 0.03  0.03   ------------------------------------------------------------------------------------------------------------------  RADIOLOGY:  No results found.   ASSESSMENT AND PLAN:   * Acute  bilateral pulmonary emboli   Given heparin IV drip, fu/ echocardiogram.   Thrombolysis by vascular 03/29/15- now much better.    Vascular suggested to start eliquis.   Pt is now on room air.  * Acute respiratory failure with hypoxia and hypercapnia, due to pulmonary emboli and  OSA,   was on BIPAP , now on nasal canula,  NEB prn.  a repeat CT angio to check for clot burden.- and then Vascular did thrombolysis and IVC filter- as pt was having worsening symptoms.   Much improved after that, on room air now- switch back to eliquis.  * A fib with RVR   Appreciated cardio consult- started on amio drip.   HR stable now.   He had some episodes of A fib with RVR with miniaml exertion, so cardio suggested to increase oral amio.- still persist- so increasing metoprololol dose.  * Left leg DVT.   On eliquis.   S/p IVC filter   Vascular PA suggested to follow up in office in 1 week for doppler.  * Hypertension Continue metoprolol. Stable.  * Left ventricular hypertrophy Continue Lasix.  * Chest pain This is secondary to pulmonary emboli, Dilaudid PCA.   Much better now- on dilaudid PRN.  I discussed with Dr. Mortimer Fries, Dr. Rockey Situ, Dr. Carmin Richmond All the records are reviewed and case discussed with Care Management/Social Workerr. Management plans discussed with the patient, family and they are in agreement.  CODE STATUS: Full code. Was planning to d/c but due to having a fib again, monitor one more day.  TOTAL TIME TAKING CARE OF THIS PATIENT: 30 minutes.  Greater than 50% time was spent on coordination of care and face-to-face counseling.  POSSIBLE D/C IN 1 DAYS, DEPENDING ON CLINICAL CONDITION. Possible d/c tomorrow.  Vaughan Basta M.D on 04/02/2015 at 9:20 PM  Between 7am to 6pm - Pager - [phone]  After 6pm go to [email or web address] - password [email or web address] [facility]  [facility]  Office  [phone]  CC: Primary care physician; Webb Silversmith, NP

## 2015-04-02 NOTE — Progress Notes (Signed)
[facility] [place] NAME: Richard [name]    MR#:  [id number]  DATE OF BIRTH:  14-Dec-1960  SUBJECTIVE:  CHIEF COMPLAINT:   Chief Complaint  Patient presents with  . Chest Pain  chest pain on right side while breathing. Respiratory distress, was on HFNC oxygen,on 03/29/15 mornign converted to A fib and having RVR up to 150. Later Pulmonary thrombolysis was done by vascular, and IVC filter is placed on 03/29/15.  Now he is much better , On room air, still have some stiffness on left leg and knee.   On minimal walking today - he had episodes of A fib with rate up to 140-150.   REVIEW OF SYSTEMS:  CONSTITUTIONAL: No fever, has weakness.  EYES: No blurred or double vision.  EARS, NOSE, AND THROAT: No tinnitus or ear pain.  RESPIRATORY: has less cough,less shortness of breath, no wheezing or hemoptysis.  CARDIOVASCULAR: has right-sided chest pain- much less now, no orthopnea,  Left leg edema.  GASTROINTESTINAL: No nausea, vomiting, diarrhea or abdominal pain.  GENITOURINARY: No dysuria, hematuria.  ENDOCRINE: No polyuria, nocturia. HEMATOLOGY: No anemia, easy bruising or bleeding. SKIN: No rash or lesion. MUSCULOSKELETAL: No joint pain or arthritis.   NEUROLOGIC: No tingling, numbness, weakness.  PSYCHIATRY: No anxiety or depression.   DRUG ALLERGIES:   Allergies  Allergen Reactions  . Penicillins Other (See Comments)    Reaction:  Unknown     VITALS:  Blood pressure 155/73, pulse 70, temperature 97.6 F (36.4 C), temperature source Oral, resp. rate 20, height 5\' 9"  (1.753 m), weight 137.7 kg (303 lb 9.2 oz), SpO2 97 %.  PHYSICAL EXAMINATION:  GENERAL:  55 y.o.-year-old patient lying in the bed with no acute distress. Obese. EYES: Pupils equal, round, reactive to light and accommodation. No scleral icterus. Extraocular muscles intact.  HEENT: Head atraumatic, normocephalic. Oropharynx and nasopharynx clear.  NECK:  Supple, no  jugular venous distention. No thyroid enlargement, no tenderness.  LUNGS: Normal breath sounds bilaterally, no wheezing, rales,rhonchi or crepitation. No use of accessory muscles of respiration.  CARDIOVASCULAR: S1, S2 regular. No murmurs, rubs, or gallops.  ABDOMEN: Soft, nontender, nondistended. Bowel sounds present. No organomegaly or mass.  EXTREMITIES: trace edema on left leg, no cyanosis, or clubbing. Left leg swollen. NEUROLOGIC: Cranial nerves II through XII are intact. Muscle strength 5/5 in all extremities. Sensation intact. Gait not checked.  PSYCHIATRIC: The patient is alert and oriented x 3.   SKIN: No obvious rash, lesion, or ulcer.    LABORATORY PANEL:   CBC  Recent Labs Lab 04/01/15 0611  WBC 7.3  HGB 12.3*  HCT 36.9*  PLT 215   ------------------------------------------------------------------------------------------------------------------  Chemistries   Recent Labs Lab 03/29/15 0434  04/01/15 0611  NA 137  < > 137  K 4.3  < > 3.9  CL 101  < > 100*  CO2 29  < > 28  GLUCOSE 98  < > 94  BUN 21*  < > 14  CREATININE 1.00  < > 1.00  CALCIUM 8.8*  < > 9.4  MG 2.0  --   --   < > = values in this interval not displayed. ------------------------------------------------------------------------------------------------------------------  Cardiac Enzymes  Recent Labs Lab 03/30/15 0034  TROPONINI 0.03  0.03   ------------------------------------------------------------------------------------------------------------------  RADIOLOGY:  No results found.   ASSESSMENT AND PLAN:   * Acute bilateral pulmonary emboli   Given heparin IV drip, fu/ echocardiogram.   Thrombolysis by vascular 03/29/15- now  much better.    Vascular suggested to start eliquis.   Pt is now on room air.  * Acute respiratory failure with hypoxia and hypercapnia, due to pulmonary emboli and OSA,   was on BIPAP , now on nasal canula,  NEB prn.  a repeat CT angio to check for clot  burden.- it showed same clot.   Vascular did thrombolysis and IVC filter- as pt was having worsening symptoms.   Much improved after that, on room air now- switch back to eliquis.  * A fib with RVR   Called urgent cardio consult- started on amio drip.   HR stable now.   He had some episodes of A fib with RVR with miniaml exertion, so cardio suggested to increase oral amio.  * Left leg DVT.   On eliquis.   S/p IVC filter   Vascular PA suggested to follow up in office in 1 week for doppler.  * Hypertension Continue metoprolol.  * Left ventricular hypertrophy Continue Lasix.  * Chest pain This is secondary to pulmonary emboli, Dilaudid PCA.   Much better now- on dilaudid PRN.  I discussed with Dr. Mortimer Fries, Dr. Rockey Situ, Dr. Carmin Richmond All the records are reviewed and case discussed with Care Management/Social Workerr. Management plans discussed with the patient, family and they are in agreement.  CODE STATUS: Full code. Was planning to d/c but due to having a fib again, monitor one more day.  TOTAL TIME TAKING CARE OF THIS PATIENT: 30 minutes.  Greater than 50% time was spent on coordination of care and face-to-face counseling.  POSSIBLE D/C IN 1 DAYS, DEPENDING ON CLINICAL CONDITION.   Vaughan Basta M.D on 04/02/2015 at 7:59 AM  Between 7am to 6pm - Pager - [phone]  After 6pm go to [email or web address] - [email or web address] [facility]  [place] [facility]  Office  [phone]  CC: Primary care physician; Webb Silversmith, NP

## 2015-04-02 NOTE — Progress Notes (Signed)
Telemetry called to say the patient's heart rate increased to 140 non-sustained. Both times I was present in the room with the patient. The first time he was rolling over in the bed and the second time he was sitting up in bed to take his medications. Patient asymptomatic during both episodes. VSS. Notified Dr. Anselm Jungling of these events and he acknowledged. No new orders received.

## 2015-04-02 NOTE — Consult Note (Signed)
ANTICOAGULATION CONSULT NOTE - Follow Up  Pharmacy Consult for Eliquis Indication: pulmonary embolus  found to have LLE DVT and bilat PE s/p IVC filter and thrombolysis per vascular surgery on 3/8.  Allergies  Allergen Reactions  . Penicillins Other (See Comments)    Reaction:  Unknown     Patient Measurements: Height: 5\' 9"  (175.3 cm) Weight: (!) 303 lb 9.2 oz (137.7 kg) IBW/kg (Calculated) : 70.7  Vital Signs: Temp: 97.6 F (36.4 C) (03/12 0545) Temp Source: Oral (03/12 0545) BP: 130/78 mmHg (03/12 0839) Pulse Rate: 67 (03/12 0839)  Labs:  Recent Labs  03/30/15 2257 03/31/15 0351 04/01/15 0611  HGB  --   --  12.3*  HCT  --   --  36.9*  PLT  --   --  215  APTT 55*  --   --   HEPARINUNFRC  --  0.84*  --   CREATININE  --   --  1.00    Estimated Creatinine Clearance: 116.5 mL/min (by C-G formula based on Cr of 1).   Medical History: Past Medical History  Diagnosis Date  . Obesity   . Hypertensive heart disease   . Chest pain     a. 2015 Abnl MV;  b. 2015 Cath: nl cors.  . Chronic diastolic CHF (congestive heart failure) ([place])     a. 03/2015 Echo: EF 55-65%, poor windows, mildly dil RV.  Marland Kitchen GERD (gastroesophageal reflux disease)   . Arthritis   . Urine incontinence   . Primary localized osteoarthritis of right knee     a. 02/2014 s/p R Partial Knee arthroplasty.  . Primary localized osteoarthritis of left knee     a. 11/2014 s/p L TKA.  Marland Kitchen OSA (obstructive sleep apnea)     a. uses CPAP nightly  . Stiffness of left knee     a. 12/2014 s/p manipulation under anesthesia.  Marland Kitchen DVT (deep venous thrombosis) ([place])     a. 03/2015 U/S: occlusive DVT w/in the distal aspect of the Left fem vein through the L popliteal vein.  . Bilateral pulmonary embolism ([place])     a. 03/2015 CTA: acute bilat PE.    Medications:  Scheduled:  . amiodarone  400 mg Oral BID  . antiseptic oral rinse  7 mL Mouth Rinse BID  . apixaban  10 mg Oral BID   Followed by  . [START ON  04/07/2015] apixaban  5 mg Oral BID  . aspirin EC  81 mg Oral Daily  . metoprolol succinate  50 mg Oral BID  . mometasone-formoterol  2 puff Inhalation BID  . oxyCODONE  5 mg Oral 4 times per day  . pantoprazole  40 mg Oral Daily  . senna-docusate  2 tablet Oral BID   Assessment: MT is a 55 yo male admitted for PE. Pharmacy consulted to dose eliquis after heparin gtt D/C'd.    Plan:  Will D/c heparin and give first apixaban dose at same time. Will start apixaban 10mg  PO BID x7 days followed by 5mg  PO BID.  Pharmacy will continue to monitor for changes in renal function that would require dose adjustment.  Krish Bailly A 04/02/2015,2:56 PM

## 2015-04-02 NOTE — Progress Notes (Signed)
Patient Name: Richard [name] Date of Encounter: 04/02/2015  Hospital Problem List     Principal Problem:   Pulmonary embolism without acute cor pulmonale (HCC) Active Problems:   Morbid obesity (HCC)   OSA (obstructive sleep apnea)   Diastolic dysfunction   Chest pain   Hypoxia   Peroneal DVT (deep venous thrombosis) (HCC)   Swelling   PAF (paroxysmal atrial fibrillation) (HCC)   Atrial fibrillation with rapid ventricular response (HCC)    Subjective   Continued paroxysmal atrial fibrillation, 2 episodes yesterday, 6:50 PM, 8 PM None since midnight Currently sleeping, snoring Significant left toe pain,  large toe   ROS General: No chills, fever, night sweats or weight changes.  Cardiovascular: improved right sided chest pain and dyspnea on exertion, no LLE/knee edema, no orthopnea, palpitations, paroxysmal nocturnal dyspnea. Dermatological: No rash, lesions/masses Respiratory: No cough, mild dyspnea Urologic: No hematuria, dysuria Abdominal: No nausea, vomiting, diarrhea, bright red blood per rectum, melena, or hematemesis Neurologic: No visual changes, wkns, changes in mental status. Musculoskeletal: Tender left large toe, pain to walk on All other systems reviewed and are otherwise negative except as noted above.  Inpatient Medications    . amiodarone  400 mg Oral BID  . antiseptic oral rinse  7 mL Mouth Rinse BID  . apixaban  10 mg Oral BID   Followed by  . [START ON 04/07/2015] apixaban  5 mg Oral BID  . aspirin EC  81 mg Oral Daily  . metoprolol succinate  25 mg Oral BID  . mometasone-formoterol  2 puff Inhalation BID  . oxyCODONE  5 mg Oral 4 times per day  . pantoprazole  40 mg Oral Daily  . senna-docusate  2 tablet Oral BID    Vital Signs    Filed Vitals:   04/01/15 2011 04/01/15 2227 04/02/15 0545 04/02/15 0839  BP: 142/94 147/79 155/73 130/78  Pulse: 83 79 70 67  Temp: 98.3 F (36.8 C)  97.6 F (36.4 C)   TempSrc: Oral  Oral   Resp:  18  20 17   Height:      Weight:      SpO2: 91%  97% 94%    Intake/Output Summary (Last 24 hours) at 04/02/15 1429 Last data filed at 04/02/15 0900  Gross per 24 hour  Intake    600 ml  Output    300 ml  Net    300 ml   Filed Weights   03/27/15 1250 03/27/15 1824 03/28/15 0900  Weight: 299 lb (135.626 kg) 300 lb 8 oz (136.306 kg) 303 lb 9.2 oz (137.7 kg)    Physical Exam    General: Pleasant, NAD. Neuro: Alert and oriented X 3. Moves all extremities spontaneously. Psych: Normal affect. HEENT:  Normal  Neck: Supple without bruits.  Difficult to gauge JVP 2/2 girth. Lungs:  Resp regular and unlabored, diminished breath sounds bilat. Heart: RRR no s3, s4, or murmurs. Abdomen: Soft, non-tender, non-distended, BS + x 4.  Extremities: No clubbing, cyanosis or edema. DP/PT/Radials 2+ and equal bilaterally.  Labs    CBC  Recent Labs  04/01/15 0611  WBC 7.3  HGB 12.3*  HCT 36.9*  MCV 80.5  PLT 123456   Basic Metabolic Panel  Recent Labs  04/01/15 0611  NA 137  K 3.9  CL 100*  CO2 28  GLUCOSE 94  BUN 14  CREATININE 1.00  CALCIUM 9.4   Cardiac Enzymes No results for input(s): CKTOTAL, CKMB, CKMBINDEX, TROPONINI in the last 72  hours. Fasting Lipid Panel No results for input(s): CHOL, HDL, LDLCALC, TRIG, CHOLHDL, LDLDIRECT in the last 72 hours. Thyroid Function Tests No results for input(s): TSH, T4TOTAL, T3FREE, THYROIDAB in the last 72 hours.  Invalid input(s): FREET3  Telemetry    Atrial fibrillation, paroxsymal, this am, now back to [facility]  Radiology    Ct Angio Chest Pe W/cm &/or Wo Cm  03/29/2015  CLINICAL DATA:  Acute onset shortness of breath 2 days ago with right chest pain with a diagnosis of pulmonary embolus at that time. Shortness of breath worsened this morning. Question increased pulmonary embolus burden. Initial encounter. EXAM: CT ANGIOGRAPHY CHEST WITH CONTRAST TECHNIQUE: Multidetector CT imaging of the chest was performed using the standard  protocol during bolus administration of intravenous contrast. Multiplanar CT image reconstructions and MIPs were obtained to evaluate the vascular anatomy. CONTRAST:  100 ml OMNIPAQUE IOHEXOL 350 MG/ML SOLN COMPARISON:  CTA chest 03/27/2015. FINDINGS: Bilateral pulmonary emboli are unchanged. The RV to LV ratio is 0.84 which is negative for right heart strain. The patient has a very small right pleural effusion which is new since the prior study. No left pleural effusion is noted. Small mediastinal lymph nodes including a 1.2 cm prevascular node on image 46 and a 1.0 cm right paratracheal node on image 47 are unchanged. The lungs demonstrate scattered atelectatic change. Peripheral opacity in the right middle lobe which could be due to atelectasis or possibly infarct is unchanged in appearance. The lungs are otherwise unremarkable. Visualized upper abdomen demonstrates fatty infiltration of the liver. Imaged intra-abdominal contents are otherwise unremarkable. The no focal bony abnormality is identified. Review of the MIP images confirms the above findings. IMPRESSION: No change in the appearance of bilateral pulmonary emboli. Negative for right heart strain. Possible small pulmonary infarct right middle lobe is unchanged. New trace right pleural effusion. Fatty infiltration of the liver. Electronically Signed   By: Inge Rise M.D.   On: 03/29/2015 16:58   Ct Angio Chest Pe W/cm &/or Wo Cm  03/27/2015  CLINICAL DATA:  Right-sided chest pain radiating under the right breast EXAM: CT ANGIOGRAPHY CHEST WITH CONTRAST TECHNIQUE: Multidetector CT imaging of the chest was performed using the standard protocol during bolus administration of intravenous contrast. Multiplanar CT image reconstructions and MIPs were obtained to evaluate the vascular anatomy. CONTRAST:  110mL OMNIPAQUE IOHEXOL 350 MG/ML SOLN COMPARISON:  None. FINDINGS: There is adequate opacification of the pulmonary arteries. There is pulmonary embolus  in the right main pulmonary artery, right upper lobe lobar artery, left lower lobe and right lower lobe lobar artery and right middle lobe lobar pulmonary artery. The main pulmonary artery, right main pulmonary artery and left main pulmonary arteries are normal in size. The heart size is normal. There is no pericardial effusion. There is right middle lobe airspace disease peripherally which may reflect atelectasis versus pulmonary infarct. There is no pleural effusion or pneumothorax. Bilateral retroareolar fibroglandular tissue as can be seen with gynecomastia. There is no axillary, hilar, or mediastinal adenopathy. There is no lytic or blastic osseous lesion. The visualized portions of the upper abdomen are unremarkable. Review of the MIP images confirms the above findings. IMPRESSION: 1. Acute bilateral pulmonary emboli. Critical Value/emergent results were called by telephone at the time of interpretation on 03/27/2015 at 2:13 pm to Dr. Lenise Arena , who verbally acknowledged these results. Electronically Signed   By: Kathreen Devoid   On: 03/27/2015 14:16   US Venous Img Lower Unilateral Left  03/27/2015  CLINICAL DATA:  Left lower extremity pain and edema. History of knee surgery on 09/2014 with subsequent manipulation on 12/2014. History of pulmonary embolism. EXAM: LEFT LOWER EXTREMITY VENOUS DOPPLER ULTRASOUND TECHNIQUE: Gray-scale sonography with graded compression, as well as color Doppler and duplex ultrasound were performed to evaluate the lower extremity deep venous systems from the level of the common femoral vein and including the common femoral, femoral, profunda femoral, popliteal and calf veins including the posterior tibial, peroneal and gastrocnemius veins when visible. The superficial great saphenous vein was also interrogated. Spectral Doppler was utilized to evaluate flow at rest and with distal augmentation maneuvers in the common femoral, femoral and popliteal veins. COMPARISON:  Chest  CTA - earlier same day FINDINGS: Contralateral Common Femoral Vein: Respiratory phasicity is normal and symmetric with the symptomatic side. No evidence of thrombus. Normal compressibility. Common Femoral Vein: No evidence of thrombus. Normal compressibility, respiratory phasicity and response to augmentation. Saphenofemoral Junction: No evidence of thrombus. Normal compressibility and flow on color Doppler imaging. Profunda Femoral Vein: No evidence of thrombus. Normal compressibility and flow on color Doppler imaging. Femoral Vein: There is a minimal amount of hypoechoic occlusive thrombus within the distal aspect of the left femoral vein (represent images 14 and 30). Popliteal Vein: There is hypoechoic expansile thrombus throughout the imaged course of the left popliteal vein (image 15, 16, 31 and 34). Calf Veins: Suboptimally visualized. Superficial Great Saphenous Vein: No evidence of thrombus. Normal compressibility and flow on color Doppler imaging. Venous Reflux:  None. Other Findings:  None. IMPRESSION: The examination is positive for occlusive DVT within the distal aspect of the left femoral vein extending through the imaged course of the left popliteal vein. Electronically Signed   By: Sandi Mariscal M.D.   On: 03/27/2015 17:02    Assessment & Plan    1. Acute Bilateral PE/Left LE DVT: S/P L TKA in November with subsequent swelling and limited mobility s/p manipulation by ortho under anesthesia in late December. Admitted with Right sided c/p and dyspnea  found to have LLE DVT and bilat PE s/p IVC filter and thrombolysis per vascular surgery on 3/8. He reports chest pain on deep inspiration has resolved --- On  Eliquis 10 mg BID x 7 days, followed by 5 mg bid   2. Afib RVR:  IV amio started on 3/8 with initial conversion then PAF overnight 3/8  3/9. No further AFib. CHA2DS2VASc = 1 . Continued paroxysmal atrial fib, 2 episodes yesterday ----Currently on amiodarone  400 mg po BID ,   blocker We'll increase metoprolol up to 50 mg twice a day  3. Acute respiratory failure: Improved, Does not need lasix at this time  4. Chronic Diastolic CHF: Nl LV fxn by echo this admission. Euvolemic on exam.  5. Hypertensive Heart Disease: Stable.  6. OSA: Uses CPAP.  Signed, Esmond Plants, MD, Ph.D [facility]

## 2015-04-03 ENCOUNTER — Other Ambulatory Visit: Payer: Self-pay | Admitting: Internal Medicine

## 2015-04-03 ENCOUNTER — Inpatient Hospital Stay: Payer: Managed Care, Other (non HMO)

## 2015-04-03 MED ORDER — OXYCODONE-ACETAMINOPHEN 5-325 MG PO TABS: 1.0000 | ORAL_TABLET | Freq: Four times a day (QID) | ORAL | Status: DC | PRN

## 2015-04-03 MED ORDER — APIXABAN 5 MG PO TABS: 5.0000 mg | ORAL_TABLET | Freq: Two times a day (BID) | ORAL | Status: DC

## 2015-04-03 MED ORDER — MOMETASONE FURO-FORMOTEROL FUM 200-5 MCG/ACT IN AERO: 2.0000 | INHALATION_SPRAY | Freq: Two times a day (BID) | RESPIRATORY_TRACT | Status: DC

## 2015-04-03 MED ORDER — AMIODARONE HCL 400 MG PO TABS: 200.0000 mg | ORAL_TABLET | Freq: Two times a day (BID) | ORAL | Status: DC

## 2015-04-03 MED ORDER — ACETAMINOPHEN 325 MG PO TABS: 650.0000 mg | ORAL_TABLET | Freq: Four times a day (QID) | ORAL | Status: DC | PRN

## 2015-04-03 NOTE — Progress Notes (Signed)
Alert and oriented. Vss. No signs of acute distress. X-ray to left foot great toe done per MD. No new order related to the result. discharge instructions given. Patient verbalizes understanding.

## 2015-04-03 NOTE — Discharge Instructions (Signed)
Activity as tolerated Cardiac diet Follow-up with primary care physician in one week Follow-up with cardiology Dr. Rockey Situ in a week Follow-up with vascular surgery Dr. Delana Meyer in a week for follow-up and repeat ultrasound, no strenuous activity until cleared by vascular surgery

## 2015-04-03 NOTE — Discharge Summary (Signed)
[facility] [place] NAME: Richard [name]    MR#:  [id number]  DATE OF BIRTH:  1960/04/19  DATE OF ADMISSION:  03/27/2015 ADMITTING PHYSICIAN: Vaughan Basta, MD  DATE OF DISCHARGE: 04/03/2015  PRIMARY CARE PHYSICIAN: Webb Silversmith, NP    ADMISSION DIAGNOSIS:  Swelling [R60.9] Other acute pulmonary embolism without acute cor pulmonale (HCC) [I26.99]  DISCHARGE DIAGNOSIS:  Principal Problem:   Pulmonary embolism without acute cor pulmonale (HCC) Active Problems:   Morbid obesity (HCC)   OSA (obstructive sleep apnea)   Diastolic dysfunction   Chest pain   Hypoxia   Peroneal DVT (deep venous thrombosis) (HCC)   Swelling   PAF (paroxysmal atrial fibrillation) (HCC)   Atrial fibrillation with rapid ventricular response ([place])   SECONDARY DIAGNOSIS:   Past Medical History  Diagnosis Date  . Obesity   . Hypertensive heart disease   . Chest pain     a. 2015 Abnl MV;  b. 2015 Cath: nl cors.  . Chronic diastolic CHF (congestive heart failure) ([place])     a. 03/2015 Echo: EF 55-65%, poor windows, mildly dil RV.  Marland Kitchen GERD (gastroesophageal reflux disease)   . Arthritis   . Urine incontinence   . Primary localized osteoarthritis of right knee     a. 02/2014 s/p R Partial Knee arthroplasty.  . Primary localized osteoarthritis of left knee     a. 11/2014 s/p L TKA.  Marland Kitchen OSA (obstructive sleep apnea)     a. uses CPAP nightly  . Stiffness of left knee     a. 12/2014 s/p manipulation under anesthesia.  Marland Kitchen DVT (deep venous thrombosis) ([place])     a. 03/2015 U/S: occlusive DVT w/in the distal aspect of the Left fem vein through the L popliteal vein.  . Bilateral pulmonary embolism ([place])     a. 03/2015 CTA: acute bilat PE.    HOSPITAL COURSE:   * Acute bilateral pulmonary emboli  s/p  heparin IV drip, fu/ echocardiogram left ventricular ejection fraction 55-60%  Thrombolysis by vascular 03/29/15- now much better.  Vascular  suggested to start eliquis. We'll request an milligrams by mouth twice a day for 7 days today is 4 out of 7 followed by 5 mg by mouth twice a day    * Acute respiratory failure with hypoxia and hypercapnia, due to pulmonary emboli and OSA,  was on BIPAP , now on nasal canula, NEB prn. Resting pulse ox 92% and ambulatory pulse ox was 96% on room air with pulse 89 a repeat CT angio to check for clot burden.- and then Vascular did thrombolysis and IVC filter- as pt was having worsening symptoms.  Much improved after that, on room air now- switched back to eliquis.  * A fib with RVR  Appreciated cardio consult- improved with amio drip. Plan is to continue metoprolol 50 mg by mouth twice a day Amiodarone 400 mg by mouth twice a day for 1 week followed by 200 mg by mouth twice a day Outpatient cardiology follow-up  HR stable now.    * Left leg DVT.  On eliquis.  S/p IVC filter  Vascular PA suggested to follow up in office in 1 week for doppler.  * Hypertension Continue metoprolol. Stable.  * Left ventricular hypertrophy Continue Lasix.  * Chest pain This is secondary to pulmonary emboli, improved significantly  DISCHARGE CONDITIONS:   fair  CONSULTS OBTAINED:  Treatment Team:  Flora Lipps, MD Minna Merritts, MD Dolores Lory  Schnier, MD Nicholes Mango, MD   PROCEDURES thrombolysis and IVC filter placement  DRUG ALLERGIES:   Allergies  Allergen Reactions  . Penicillins Other (See Comments)    Reaction:  Unknown     DISCHARGE MEDICATIONS:   Current Discharge Medication List    START taking these medications   Details  acetaminophen (TYLENOL) 325 MG tablet Take 2 tablets (650 mg total) by mouth every 6 (six) hours as needed for moderate pain (headache).    amiodarone (PACERONE) 400 MG tablet Take 0.5 tablets (200 mg total) by mouth 2 (two) times daily. Qty: 70 tablet, Refills: 0    apixaban (ELIQUIS) 5 MG TABS tablet Take 1 tablet (5 mg total) by  mouth 2 (two) times daily. Qty: 60 tablet, Refills: 0    mometasone-formoterol (DULERA) 200-5 MCG/ACT AERO Inhale 2 puffs into the lungs 2 (two) times daily. Qty: 1 Inhaler, Refills: 0    oxyCODONE-acetaminophen (PERCOCET/ROXICET) 5-325 MG tablet Take 1-2 tablets by mouth every 6 (six) hours as needed for moderate pain. Qty: 30 tablet, Refills: 0      CONTINUE these medications which have NOT CHANGED   Details  aspirin EC 81 MG tablet Take 81 mg by mouth daily.    baclofen (LIORESAL) 10 MG tablet Take 10 mg by mouth 3 (three) times daily as needed for muscle spasms.    esomeprazole (NEXIUM) 40 MG capsule Take 40 mg by mouth daily as needed (for reflux).     furosemide (LASIX) 40 MG tablet Take 40 mg by mouth daily as needed for edema.    HYDROcodone-acetaminophen (NORCO/VICODIN) 5-325 MG tablet Take 1 tablet by mouth every 6 (six) hours as needed for moderate pain.    metoprolol succinate (TOPROL-XL) 25 MG 24 hr tablet Take 0.5 tablets (12.5 mg total) by mouth 2 (two) times daily. Qty: 90 tablet, Refills: 3    senna-docusate (SENOKOT-S) 8.6-50 MG tablet Take 1 tablet by mouth daily.    traMADol (ULTRAM) 50 MG tablet Take 50 mg by mouth every 12 (twelve) hours as needed for moderate pain.      STOP taking these medications     diclofenac (VOLTAREN) 75 MG EC tablet          DISCHARGE INSTRUCTIONS:   Activity as tolerated Cardiac diet Follow-up with primary care physician in one week Follow-up with cardiology Dr. Rockey Situ in a week Follow-up with vascular surgery Dr. Delana Meyer in a week for follow-up and repeat ultrasound, no strenuous activity until cleared by vascular surgery   DIET:  Cardiac diet  DISCHARGE CONDITION:  Fair  ACTIVITY:  Activity as tolerated  OXYGEN:  Home Oxygen: No.   Oxygen Delivery: room air  DISCHARGE LOCATION:  home   If you experience worsening of your admission symptoms, develop shortness of breath, life threatening emergency,  suicidal or homicidal thoughts you must seek medical attention immediately by calling 911 or calling your MD immediately  if symptoms less severe.  You Must read complete instructions/literature along with all the possible adverse reactions/side effects for all the Medicines you take and that have been prescribed to you. Take any new Medicines after you have completely understood and accpet all the possible adverse reactions/side effects.   Please note  You were cared for by a hospitalist during your hospital stay. If you have any questions about your discharge medications or the care you received while you were in the hospital after you are discharged, you can call the unit and asked to speak with the hospitalist  on call if the hospitalist that took care of you is not available. Once you are discharged, your primary care physician will handle any further medical issues. Please note that NO REFILLS for any discharge medications will be authorized once you are discharged, as it is imperative that you return to your primary care physician (or establish a relationship with a primary care physician if you do not have one) for your aftercare needs so that they can reassess your need for medications and monitor your lab values.     Today  Chief Complaint  Patient presents with  . Chest Pain   Pt is doing fine, denies any chest pain or sob.    ROS:  CONSTITUTIONAL: Denies fevers, chills. Denies any fatigue, weakness.  EYES: Denies blurry vision, double vision, eye pain. EARS, NOSE, THROAT: Denies tinnitus, ear pain, hearing loss. RESPIRATORY: Denies cough, wheeze, shortness of breath.  CARDIOVASCULAR: Denies chest pain, palpitations, edema.  GASTROINTESTINAL: Denies nausea, vomiting, diarrhea, abdominal pain. Denies bright red blood per rectum. GENITOURINARY: Denies dysuria, hematuria. ENDOCRINE: Denies nocturia or thyroid problems. HEMATOLOGIC AND LYMPHATIC: Denies easy bruising or  bleeding. SKIN: Denies rash or lesion. MUSCULOSKELETAL: Denies pain in neck, back, shoulder, knees, hips or arthritic symptoms.  NEUROLOGIC: Denies paralysis, paresthesias.  PSYCHIATRIC: Denies anxiety or depressive symptoms.   VITAL SIGNS:  Blood pressure 132/73, pulse 63, temperature 98.1 F (36.7 C), temperature source Oral, resp. rate 20, height 5\' 9"  (1.753 m), weight 137.7 kg (303 lb 9.2 oz), SpO2 95 %.  I/O:    Intake/Output Summary (Last 24 hours) at 04/03/15 1307 Last data filed at 04/03/15 0900  Gross per 24 hour  Intake    680 ml  Output   1100 ml  Net   -420 ml    PHYSICAL EXAMINATION:  GENERAL:  55 y.o.-year-old patient lying in the bed with no acute distress.  EYES: Pupils equal, round, reactive to light and accommodation. No scleral icterus. Extraocular muscles intact.  HEENT: Head atraumatic, normocephalic. Oropharynx and nasopharynx clear.  NECK:  Supple, no jugular venous distention. No thyroid enlargement, no tenderness.  LUNGS: Normal breath sounds bilaterally, no wheezing, rales,rhonchi or crepitation. No use of accessory muscles of respiration.  CARDIOVASCULAR: S1, S2 normal. No murmurs, rubs, or gallops.  ABDOMEN: Soft, non-tender, non-distended. Bowel sounds present. No organomegaly or mass.  EXTREMITIES: No pedal edema, cyanosis, or clubbing.  NEUROLOGIC: Cranial nerves II through XII are intact. Muscle strength 5/5 in all extremities. Sensation intact. Gait not checked.  PSYCHIATRIC: The patient is alert and oriented x 3.  SKIN: No obvious rash, lesion, or ulcer.   DATA REVIEW:   CBC  Recent Labs Lab 04/01/15 0611  WBC 7.3  HGB 12.3*  HCT 36.9*  PLT 215    Chemistries   Recent Labs Lab 03/29/15 0434  04/01/15 0611  NA 137  < > 137  K 4.3  < > 3.9  CL 101  < > 100*  CO2 29  < > 28  GLUCOSE 98  < > 94  BUN 21*  < > 14  CREATININE 1.00  < > 1.00  CALCIUM 8.8*  < > 9.4  MG 2.0  --   --   < > = values in this interval not  displayed.  Cardiac Enzymes  Recent Labs Lab 03/30/15 0034  TROPONINI 0.03  0.03    Microbiology Results  Results for orders placed or performed during the hospital encounter of 03/27/15  MRSA PCR Screening  Status: None   Collection Time: 03/28/15  8:17 AM  Result Value Ref Range Status   MRSA by PCR NEGATIVE NEGATIVE Final    Comment:        The GeneXpert MRSA Assay (FDA approved for NASAL specimens only), is one component of a comprehensive MRSA colonization surveillance program. It is not intended to diagnose MRSA infection nor to guide or monitor treatment for MRSA infections.     RADIOLOGY:  No results found.  EKG:   Orders placed or performed during the hospital encounter of 03/27/15  . EKG 12-Lead  . EKG 12-Lead  . ED EKG within 10 minutes  . ED EKG within 10 minutes      Management plans discussed with the patient, family and they are in agreement.  CODE STATUS:     Code Status Orders        Start     Ordered   03/27/15 1728  Full code   Continuous     03/27/15 1727    Code Status History    Date Active Date Inactive Code Status Order ID Comments User Context   12/06/2014  1:32 PM 12/09/2014  3:04 PM Full Code [id number]  Marchia Bond, MD Inpatient   02/25/2014  7:08 PM 02/26/2014  1:02 PM Full Code [id number]  Johnny Bridge, MD Inpatient   01/12/2014  9:32 AM 01/12/2014  2:58 PM Full Code [id number]  Jettie Booze, MD Inpatient   08/16/2011  1:45 AM 08/17/2011  4:01 PM Full Code [id number]  Marlane Hatcher, RN Inpatient      TOTAL TIME TAKING CARE OF THIS PATIENT: 45 minutes.    @[facility] @  on 04/03/2015 at 1:07 PM  Between 7am to 6pm - Pager - [phone]  After 6pm go to [email or web address] - password [email or web address] [facility]  [facility]  Office  [phone]  CC: Primary care physician; Webb Silversmith, NP

## 2015-04-03 NOTE — Progress Notes (Signed)
Patient Name: Richard [name] Date of Encounter: 04/03/2015  Hospital Problem List     Principal Problem:   Pulmonary embolism without acute cor pulmonale (HCC) Active Problems:   Hypoxia   Peroneal DVT (deep venous thrombosis) (HCC)   PAF (paroxysmal atrial fibrillation) (HCC)   Diastolic dysfunction   Chest pain   Morbid obesity (HCC)   OSA (obstructive sleep apnea)   Swelling   Atrial fibrillation with rapid ventricular response ([place])    Subjective   Still with right sided pleuritic c/p but overall feels well.  C/O left foot pain - ball of the foot - present since IVC filter placement.  Brief episode of AF yesterday morning (8:34 am)  asymptomatic.  Inpatient Medications    . amiodarone  400 mg Oral BID  . antiseptic oral rinse  7 mL Mouth Rinse BID  . apixaban  10 mg Oral BID   Followed by  . [START ON 04/07/2015] apixaban  5 mg Oral BID  . aspirin EC  81 mg Oral Daily  . metoprolol succinate  50 mg Oral BID  . mometasone-formoterol  2 puff Inhalation BID  . oxyCODONE  5 mg Oral 4 times per day  . pantoprazole  40 mg Oral Daily  . senna-docusate  2 tablet Oral BID    Vital Signs    Filed Vitals:   04/02/15 1742 04/02/15 1743 04/02/15 2031 04/03/15 0451  BP: 130/73 130/73 133/75 132/73  Pulse: 71 74 74 63  Temp:   98.2 F (36.8 C) 98.1 F (36.7 C)  TempSrc:   Oral Oral  Resp:   20 20  Height:      Weight:      SpO2: 94%  97% 95%    Intake/Output Summary (Last 24 hours) at 04/03/15 0757 Last data filed at 04/03/15 0500  Gross per 24 hour  Intake    840 ml  Output   1400 ml  Net   -560 ml   Filed Weights   03/27/15 1250 03/27/15 1824 03/28/15 0900  Weight: 299 lb (135.626 kg) 300 lb 8 oz (136.306 kg) 303 lb 9.2 oz (137.7 kg)    Physical Exam    General: Pleasant, NAD. Neuro: Alert and oriented X 3. Moves all extremities spontaneously. Psych: Normal affect. HEENT:  Normal  Neck: Supple without bruits or JVD. Lungs:  Resp regular and  unlabored, CTA. Heart: RRR no s3, s4, or murmurs. Abdomen: Soft, non-tender, non-distended, BS + x 4.  Extremities: No clubbing, cyanosis or edema. DP/PT/Radials 2+ and equal bilaterally. C/o pain and tenderness on the plantar surface of the left foot - ball of the foot.  No heat, erythema, swelling, breakdown.  Labs    CBC  Recent Labs  04/01/15 0611  WBC 7.3  HGB 12.3*  HCT 36.9*  MCV 80.5  PLT 123456   Basic Metabolic Panel  Recent Labs  04/01/15 0611  NA 137  K 3.9  CL 100*  CO2 28  GLUCOSE 94  BUN 14  CREATININE 1.00  CALCIUM 9.4    Telemetry    Brief episode of AF on morning of 3/12.  No further AF since - sinus/sinus brady.  Radiology    Ct Angio Chest Pe W/cm &/or Wo Cm  03/29/2015  CLINICAL DATA:  Acute onset shortness of breath 2 days ago with right chest pain with a diagnosis of pulmonary embolus at that time. Shortness of breath worsened this morning. Question increased pulmonary embolus burden. Initial encounter. EXAM: CT  ANGIOGRAPHY CHEST WITH CONTRAST TECHNIQUE: Multidetector CT imaging of the chest was performed using the standard protocol during bolus administration of intravenous contrast. Multiplanar CT image reconstructions and MIPs were obtained to evaluate the vascular anatomy. CONTRAST:  100 ml OMNIPAQUE IOHEXOL 350 MG/ML SOLN COMPARISON:  CTA chest 03/27/2015. FINDINGS: Bilateral pulmonary emboli are unchanged. The RV to LV ratio is 0.84 which is negative for right heart strain. The patient has a very small right pleural effusion which is new since the prior study. No left pleural effusion is noted. Small mediastinal lymph nodes including a 1.2 cm prevascular node on image 46 and a 1.0 cm right paratracheal node on image 47 are unchanged. The lungs demonstrate scattered atelectatic change. Peripheral opacity in the right middle lobe which could be due to atelectasis or possibly infarct is unchanged in appearance. The lungs are otherwise unremarkable.  Visualized upper abdomen demonstrates fatty infiltration of the liver. Imaged intra-abdominal contents are otherwise unremarkable. The no focal bony abnormality is identified. Review of the MIP images confirms the above findings. IMPRESSION: No change in the appearance of bilateral pulmonary emboli. Negative for right heart strain. Possible small pulmonary infarct right middle lobe is unchanged. New trace right pleural effusion. Fatty infiltration of the liver. Electronically Signed   By: Inge Rise M.D.   On: 03/29/2015 16:58   Ct Angio Chest Pe W/cm &/or Wo Cm  03/27/2015  CLINICAL DATA:  Right-sided chest pain radiating under the right breast EXAM: CT ANGIOGRAPHY CHEST WITH CONTRAST TECHNIQUE: Multidetector CT imaging of the chest was performed using the standard protocol during bolus administration of intravenous contrast. Multiplanar CT image reconstructions and MIPs were obtained to evaluate the vascular anatomy. CONTRAST:  139mL OMNIPAQUE IOHEXOL 350 MG/ML SOLN COMPARISON:  None. FINDINGS: There is adequate opacification of the pulmonary arteries. There is pulmonary embolus in the right main pulmonary artery, right upper lobe lobar artery, left lower lobe and right lower lobe lobar artery and right middle lobe lobar pulmonary artery. The main pulmonary artery, right main pulmonary artery and left main pulmonary arteries are normal in size. The heart size is normal. There is no pericardial effusion. There is right middle lobe airspace disease peripherally which may reflect atelectasis versus pulmonary infarct. There is no pleural effusion or pneumothorax. Bilateral retroareolar fibroglandular tissue as can be seen with gynecomastia. There is no axillary, hilar, or mediastinal adenopathy. There is no lytic or blastic osseous lesion. The visualized portions of the upper abdomen are unremarkable. Review of the MIP images confirms the above findings. IMPRESSION: 1. Acute bilateral pulmonary emboli.  Critical Value/emergent results were called by telephone at the time of interpretation on 03/27/2015 at 2:13 pm to Dr. Lenise Arena , who verbally acknowledged these results. Electronically Signed   By: Kathreen Devoid   On: 03/27/2015 14:16   US Venous Img Lower Unilateral Left  03/27/2015  CLINICAL DATA:  Left lower extremity pain and edema. History of knee surgery on 09/2014 with subsequent manipulation on 12/2014. History of pulmonary embolism. EXAM: LEFT LOWER EXTREMITY VENOUS DOPPLER ULTRASOUND TECHNIQUE: Gray-scale sonography with graded compression, as well as color Doppler and duplex ultrasound were performed to evaluate the lower extremity deep venous systems from the level of the common femoral vein and including the common femoral, femoral, profunda femoral, popliteal and calf veins including the posterior tibial, peroneal and gastrocnemius veins when visible. The superficial great saphenous vein was also interrogated. Spectral Doppler was utilized to evaluate flow at rest and with distal augmentation maneuvers in  the common femoral, femoral and popliteal veins. COMPARISON:  Chest CTA - earlier same day FINDINGS: Contralateral Common Femoral Vein: Respiratory phasicity is normal and symmetric with the symptomatic side. No evidence of thrombus. Normal compressibility. Common Femoral Vein: No evidence of thrombus. Normal compressibility, respiratory phasicity and response to augmentation. Saphenofemoral Junction: No evidence of thrombus. Normal compressibility and flow on color Doppler imaging. Profunda Femoral Vein: No evidence of thrombus. Normal compressibility and flow on color Doppler imaging. Femoral Vein: There is a minimal amount of hypoechoic occlusive thrombus within the distal aspect of the left femoral vein (represent images 14 and 30). Popliteal Vein: There is hypoechoic expansile thrombus throughout the imaged course of the left popliteal vein (image 15, 16, 31 and 34). Calf Veins:  Suboptimally visualized. Superficial Great Saphenous Vein: No evidence of thrombus. Normal compressibility and flow on color Doppler imaging. Venous Reflux:  None. Other Findings:  None. IMPRESSION: The examination is positive for occlusive DVT within the distal aspect of the left femoral vein extending through the imaged course of the left popliteal vein. Electronically Signed   By: Sandi Mariscal M.D.   On: 03/27/2015 17:02    Assessment & Plan    1. Acute Bilateral PE/Left LE DVT: S/P L TKA in November with subsequent swelling and limited mobility s/p manipulation by ortho under anesthesia in late December. Admitted with Right sided c/p and dyspnea  found to have LLE DVT and bilat PE s/p IVC filter and thrombolysis per vascular surgery on 3/8. He continues to have mild right sided pleuritic c/p. --- On Eliquis 10 mg BID x 7 days (today is day 4 of 7), followed by 5 mg bid.   2. Afib RVR:  IV amio started on 3/8.  Brief paroxysm of AF noted on morning of 3/12 - asymptomatic. CHA2DS2VASc = 1 . -Metoprolol increased to 50 BID 3/12 (received 25 mg in the AM and 50 mg in the PM) - continue @ 50 BID. -Would plan to continue amio @ 400 BID for a week and then reduce to 200 BID as an outpt. -Hope to limit amio course to 4-6 wks post PE.  3. Acute respiratory failure: In setting of sub-massive PE. Stable. No evidence of volume overload.  4. Chronic Diastolic CHF: Nl LV fxn by echo this admission. Euvolemic on exam.  5. Hypertensive Heart Disease: Stable.  6. OSA: Uses CPAP.  Signed, Murray Hodgkins NP   Attending Note Patient seen and examined, agree with detailed note above,  Patient presentation and plan discussed on rounds.   Doing well as morning, Review of telemetry shows no significant atrial fibrillation in the past 36 hours Tolerating high-dose amiodarone and beta blocker Continues to complain of toe pain on weightbearing Tolerating  anticoagulation  Clinical exam essentially unchanged, heart rate regular rhythm, lungs clear, no edema, abdomen soft Left large toe pain on the bottom of his foot, no pain around the joint concerning for gout  --Discussed his discharge medications with him, Recommended he avoid meloxicam as he is on anticoagulation Would stay on amiodarone 400 mg twice a day for 4-5 more days then down to 200 mg twice a day -Would stay on higher dose beta blocker as listed Close outpatient follow-up in several weeks' time    Signed: Esmond Plants  M.D., Ph.D. [facility]

## 2015-04-04 ENCOUNTER — Telehealth: Payer: Self-pay | Admitting: Cardiovascular Disease

## 2015-04-04 ENCOUNTER — Telehealth: Payer: Self-pay

## 2015-04-04 NOTE — Telephone Encounter (Signed)
Spoke w/ pt.  Advised him that he may take his baclofen as prescribed.  Asked him to call back w/ any questions or concerns.

## 2015-04-04 NOTE — Telephone Encounter (Signed)
Pt calling back, would like to know if he can take a muscle relaxer with his heart medications. Please call and advise

## 2015-04-04 NOTE — Care Management (Signed)
Late entry:  Patient discharge to home.  Open with [facility] Pt at home.  Resumption of order placed.  Concated [facility] and faxed patient information.  RNCM signed off

## 2015-04-04 NOTE — Telephone Encounter (Signed)
Patient contacted regarding discharge from [facility] on 04/03/15.  Patient understands to follow up with Dr. Fletcher Anon on 05/05/15 at 2:30 at [facility]. Patient understands discharge instructions? yes Patient understands medications and regiment? yes Patient understands to bring all medications to this visit? yes

## 2015-04-04 NOTE — Telephone Encounter (Signed)
Attempted to contact pt.  No answer, voicemail box not set up yet.

## 2015-04-05 ENCOUNTER — Telehealth: Payer: Self-pay | Admitting: *Deleted

## 2015-04-05 NOTE — Telephone Encounter (Signed)
Transition Care Management Follow-up Telephone Call   Date discharged? 04/03/15   How have you been since you were released from the hospital? Feeling weak, but improving.   Do you understand why you were in the hospital? yes   Do you understand the discharge instructions? yes   Where were you discharged to? home   Items Reviewed:  Medications reviewed: yes  Allergies reviewed: yes  Dietary changes reviewed: no  Referrals reviewed: cardiology- 4/14 with Dr. Fletcher Anon, vascular surgery   Functional Questionnaire:   Activities of Daily Living (ADLs):   He states they are independent in the following: ambulation, feeding, continence, grooming and toileting States they require assistance with the following: bathing and hygiene and dressing   Any transportation issues/concerns?: no   Any patient concerns? yes, new medications - to discuss at follow up   Confirmed importance and date/time of follow-up visits scheduled yes, 04/10/15 @ 1300  Provider Appointment booked with Webb Silversmith, NP  Confirmed with patient if condition begins to worsen call PCP or go to the ER.  Patient was given the office number and encouraged to call back with question or concerns.  : yes

## 2015-04-05 NOTE — Telephone Encounter (Signed)
noted 

## 2015-04-07 ENCOUNTER — Encounter: Payer: Self-pay | Admitting: Emergency Medicine

## 2015-04-07 ENCOUNTER — Emergency Department: Payer: Managed Care, Other (non HMO)

## 2015-04-07 ENCOUNTER — Emergency Department
Admission: EM | Admit: 2015-04-07 | Discharge: 2015-04-07 | Disposition: A | Discharge status: Home / Self Care | Payer: Managed Care, Other (non HMO) | Attending: Emergency Medicine | Admitting: Emergency Medicine

## 2015-04-07 DIAGNOSIS — Z79899 Other long term (current) drug therapy: Secondary | ICD-10-CM | POA: Diagnosis not present

## 2015-04-07 DIAGNOSIS — R091 Pleurisy: Secondary | ICD-10-CM

## 2015-04-07 DIAGNOSIS — I5031 Acute diastolic (congestive) heart failure: Secondary | ICD-10-CM | POA: Diagnosis not present

## 2015-04-07 DIAGNOSIS — M1711 Unilateral primary osteoarthritis, right knee: Secondary | ICD-10-CM | POA: Diagnosis not present

## 2015-04-07 DIAGNOSIS — Z7982 Long term (current) use of aspirin: Secondary | ICD-10-CM | POA: Diagnosis not present

## 2015-04-07 DIAGNOSIS — I5032 Chronic diastolic (congestive) heart failure: Secondary | ICD-10-CM | POA: Insufficient documentation

## 2015-04-07 DIAGNOSIS — I2782 Chronic pulmonary embolism: Secondary | ICD-10-CM

## 2015-04-07 DIAGNOSIS — I110 Hypertensive heart disease with heart failure: Secondary | ICD-10-CM | POA: Diagnosis not present

## 2015-04-07 DIAGNOSIS — E669 Obesity, unspecified: Secondary | ICD-10-CM | POA: Diagnosis not present

## 2015-04-07 DIAGNOSIS — R0789 Other chest pain: Secondary | ICD-10-CM | POA: Diagnosis present

## 2015-04-07 LAB — CBC WITH DIFFERENTIAL/PLATELET
Basophils Absolute: 0.2 10*3/uL — ABNORMAL HIGH (ref 0–0.1)
Basophils Relative: 1 %
Eosinophils Absolute: 0.1 10*3/uL (ref 0–0.7)
Eosinophils Relative: 1 %
HCT: 40 % (ref 40.0–52.0)
Hemoglobin: 13 g/dL (ref 13.0–18.0)
Lymphocytes Relative: 20 %
Lymphs Abs: 2.2 10*3/uL (ref 1.0–3.6)
MCH: 26.3 pg (ref 26.0–34.0)
MCHC: 32.6 g/dL (ref 32.0–36.0)
MCV: 80.6 fL (ref 80.0–100.0)
Monocytes Absolute: 1.1 10*3/uL — ABNORMAL HIGH (ref 0.2–1.0)
Monocytes Relative: 10 %
Neutro Abs: 7.6 10*3/uL — ABNORMAL HIGH (ref 1.4–6.5)
Neutrophils Relative %: 68 %
Platelets: 318 10*3/uL (ref 150–440)
RBC: 4.96 MIL/uL (ref 4.40–5.90)
RDW: 15.7 % — ABNORMAL HIGH (ref 11.5–14.5)
WBC: 11.3 10*3/uL — ABNORMAL HIGH (ref 3.8–10.6)

## 2015-04-07 LAB — BASIC METABOLIC PANEL
Anion gap: 9 (ref 5–15)
BUN: 22 mg/dL — ABNORMAL HIGH (ref 6–20)
CO2: 23 mmol/L (ref 22–32)
Calcium: 9 mg/dL (ref 8.9–10.3)
Chloride: 103 mmol/L (ref 101–111)
Creatinine, Ser: 1.16 mg/dL (ref 0.61–1.24)
GFR calc Af Amer: >60 mL/min (ref >60)
GFR calc non Af Amer: >60 mL/min (ref >60)
Glucose, Bld: 107 mg/dL — ABNORMAL HIGH (ref 65–99)
Potassium: 3.8 mmol/L (ref 3.5–5.1)
Sodium: 135 mmol/L (ref 135–145)

## 2015-04-07 LAB — TROPONIN I: Troponin I: <0.03 ng/mL (ref <0.031)

## 2015-04-07 MED ORDER — IOHEXOL 350 MG/ML SOLN
100.0000 mL | Freq: Once | INTRAVENOUS | Status: AC | PRN
  Administered 2015-04-07: 100 mL via INTRAVENOUS

## 2015-04-07 MED ORDER — SODIUM CHLORIDE 0.9 % IV BOLUS (SEPSIS)
1000.0000 mL | Freq: Once | INTRAVENOUS | Status: AC
  Administered 2015-04-07: 1000 mL via INTRAVENOUS

## 2015-04-07 NOTE — ED Notes (Signed)
Recently was admitted to hospital for blood clots  Now having pain to right side of chest   Worse with breathing  States sx's started when his blood thinners were changed

## 2015-04-07 NOTE — ED Provider Notes (Signed)
[facility] Emergency Department Provider Note  ____________________________________________  Time seen: Approximately 330 PM  I have reviewed the triage vital signs and the nursing notes.   HISTORY  Chief Complaint Chest Pain    HPI BANE CRUCES is a 55 y.o. male with a recent diagnosis of pulmonary embolus on eliquis was presenting to the emergency department today for sudden onset chest pain that started at 5 AM this morning. He says that the chest pain is right-sided and worsened with deep breathing. He denies any radiation. Says the chest pain is mild at this time and sharp. He denies any known injury. Says he has been compliant with his eliquis. Was recently discharged from the hospital after receiving an IVC filter.Says that since he has started treatment his left leg, where he was has a known DVT, has decreased in swelling as well as pain level.   Past Medical History  Diagnosis Date  . Obesity   . Hypertensive heart disease   . Chest pain     a. 2015 Abnl MV;  b. 2015 Cath: nl cors.  . Chronic diastolic CHF (congestive heart failure) ([place])     a. 03/2015 Echo: EF 55-65%, poor windows, mildly dil RV.  Marland Kitchen GERD (gastroesophageal reflux disease)   . Arthritis   . Urine incontinence   . Primary localized osteoarthritis of right knee     a. 02/2014 s/p R Partial Knee arthroplasty.  . Primary localized osteoarthritis of left knee     a. 11/2014 s/p L TKA.  Marland Kitchen OSA (obstructive sleep apnea)     a. uses CPAP nightly  . Stiffness of left knee     a. 12/2014 s/p manipulation under anesthesia.  Marland Kitchen DVT (deep venous thrombosis) ([place])     a. 03/2015 U/S: occlusive DVT w/in the distal aspect of the Left fem vein through the L popliteal vein.  . Bilateral pulmonary embolism ([place])     a. 03/2015 CTA: acute bilat PE.    Patient Active Problem List   Diagnosis Date Noted  . PAF (paroxysmal atrial fibrillation) ([place]) 03/30/2015  . Atrial fibrillation with rapid  ventricular response ([place])   . Hypoxia   . Pulmonary embolism without acute cor pulmonale ([place])   . Peroneal DVT (deep venous thrombosis) ([place])   . Swelling   . Pulmonary embolism ([place]) 03/27/2015  . Chest pain 03/27/2015  . Cephalalgia 03/03/2015  . Pleuritic chest pain 03/03/2015  . Stiffness of left knee 01/20/2015  . Primary localized osteoarthritis of left knee 12/06/2014  . Knee osteoarthritis 12/06/2014  . Diastolic dysfunction [id number]  . Primary localized osteoarthritis of right knee 02/25/2014  . S/P right unicompartmental knee replacement 02/25/2014  . Abnormal nuclear stress test   . Arthritis 03/16/2013  . Hypertension 08/17/2011  . LVH (left ventricular hypertrophy) 08/17/2011  . GERD (gastroesophageal reflux disease) 08/17/2011  . Morbid obesity ([place]) 08/16/2011  . OSA (obstructive sleep apnea) 08/16/2011    Past Surgical History  Procedure Laterality Date  . Appendectomy    . Orthopedic surgery Left     arthroscopy x3  . Left heart catheterization with coronary angiogram N/A 01/12/2014    Procedure: LEFT HEART CATHETERIZATION WITH CORONARY ANGIOGRAM;  Surgeon: Jettie Booze, MD;  Location: [facility] CATH LAB;  Service: Cardiovascular;  Laterality: N/A;  . Partial knee arthroplasty Right 02/25/2014    Procedure: RIGHT KNEE ARTHROPLASTY CONDYLE AND PLATEAU MEDIAL COMPARTMENT ;  Surgeon: Johnny Bridge, MD;  Location: [place];  Service: Orthopedics;  Laterality: Right;  . Joint replacement    . Total knee arthroplasty  12/06/2014    Procedure: TOTAL KNEE ARTHROPLASTY;  Surgeon: Marchia Bond, MD;  Location: [place];  Service: Orthopedics;;  . Knee closed reduction Left 01/20/2015    Procedure: LEFT KNEE MANIPULATION;  Surgeon: Marchia Bond, MD;  Location: [place];  Service: Orthopedics;  Laterality: Left;  . Peripheral vascular catheterization N/A 03/29/2015    Procedure: IVC Filter Insertion, and possible thrombectomy;  Surgeon:  Katha Cabal, MD;  Location: [place] CV LAB;  Service: Cardiovascular;  Laterality: N/A;    Current Outpatient Rx  Name  Route  Sig  Dispense  Refill  . acetaminophen (TYLENOL) 325 MG tablet   Oral   Take 650 mg by mouth every 6 (six) hours as needed for mild pain or headache.          Marland Kitchen amiodarone (PACERONE) 400 MG tablet   Oral   Take 0.5 tablets (200 mg total) by mouth 2 (two) times daily.   70 tablet   0     Take 400 mg by mouth twice a day until March 17 fo ...   . apixaban (ELIQUIS) 5 MG TABS tablet   Oral   Take 1 tablet (5 mg total) by mouth 2 (two) times daily.   60 tablet   0     10 mg by mouth twice a day until March 16 and star ...   . aspirin EC 81 MG tablet   Oral   Take 81 mg by mouth daily.         . baclofen (LIORESAL) 10 MG tablet   Oral   Take 10 mg by mouth 3 (three) times daily as needed for muscle spasms.         Marland Kitchen esomeprazole (NEXIUM) 40 MG capsule   Oral   Take 40 mg by mouth daily as needed (for acid reflux).          . furosemide (LASIX) 40 MG tablet   Oral   Take 40 mg by mouth daily as needed for edema.         . metoprolol succinate (TOPROL-XL) 25 MG 24 hr tablet   Oral   Take 0.5 tablets (12.5 mg total) by mouth 2 (two) times daily.   90 tablet   3   . mometasone-formoterol (DULERA) 200-5 MCG/ACT AERO   Inhalation   Inhale 2 puffs into the lungs 2 (two) times daily.   1 Inhaler   0   . oxyCODONE-acetaminophen (PERCOCET/ROXICET) 5-325 MG tablet   Oral   Take 1-2 tablets by mouth every 6 (six) hours as needed for severe pain.         Marland Kitchen senna-docusate (SENOKOT-S) 8.6-50 MG tablet   Oral   Take 2 tablets by mouth 2 (two) times daily.          . traMADol (ULTRAM) 50 MG tablet   Oral   Take 50 mg by mouth every 12 (twelve) hours as needed for moderate pain.           Allergies Penicillins  Family History  Problem Relation Age of Onset  . Coronary artery disease Mother   . Hyperlipidemia Mother    . Hypertension Mother   . Arthritis Mother   . Coronary artery disease Father   . Heart disease Paternal Grandmother   . Alzheimer's disease Paternal Grandfather     Social History Social History  Substance  Use Topics  . Smoking status: Never Smoker   . Smokeless tobacco: Never Used  . Alcohol Use: Yes     Comment: occassional    Review of Systems Constitutional: No fever/chills Eyes: No visual changes. ENT: No sore throat. Cardiovascular: As above Respiratory: Denies shortness of breath. Gastrointestinal: No abdominal pain.  No nausea, no vomiting.  No diarrhea.  No constipation. Genitourinary: Negative for dysuria. Musculoskeletal: Negative for back pain. Skin: Negative for rash. Neurological: Negative for headaches, focal weakness or numbness.  10-point ROS otherwise negative.  ____________________________________________   PHYSICAL EXAM:  VITAL SIGNS: ED Triage Vitals  Enc Vitals Group     BP 04/07/15 1516 156/90 mmHg     Pulse Rate 04/07/15 1516 85     Resp 04/07/15 1516 22     Temp 04/07/15 1516 98.1 F (36.7 C)     Temp src --      SpO2 04/07/15 1516 97 %     Weight 04/07/15 1516 285 lb (129.275 kg)     Height 04/07/15 1516 5\' 9"  (1.753 m)     Head Cir --      Peak Flow --      Pain Score 04/07/15 1516 6     Pain Loc --      Pain Edu? --      Excl. in [place]? --     Constitutional: Alert and oriented. Well appearing and in no acute distress. Eyes: Conjunctivae are normal. PERRL. EOMI. Head: Atraumatic. Nose: No congestion/rhinnorhea. Mouth/Throat: Mucous membranes are moist.   Neck: No stridor.   Cardiovascular: Normal rate, regular rhythm. Grossly normal heart sounds.  Good peripheral circulation. No reproducible chest pain to palpation. Respiratory: Normal respiratory effort.  No retractions. Lungs CTAB. Gastrointestinal: Soft and nontender. No distention. No abdominal bruits. No CVA tenderness. Musculoskeletal: No lower extremity tenderness nor  edema.  No joint effusions. Neurologic:  Normal speech and language. No gross focal neurologic deficits are appreciated.  Skin:  Skin is warm, dry and intact. No rash noted. Psychiatric: Mood and affect are normal. Speech and behavior are normal.  ____________________________________________   LABS (all labs ordered are listed, but only abnormal results are displayed)  Labs Reviewed  CBC WITH DIFFERENTIAL/PLATELET - Abnormal; Notable for the following:    WBC 11.3 (*)    RDW 15.7 (*)    Neutro Abs 7.6 (*)    Monocytes Absolute 1.1 (*)    Basophils Absolute 0.2 (*)    All other components within normal limits  BASIC METABOLIC PANEL - Abnormal; Notable for the following:    Glucose, Bld 107 (*)    BUN 22 (*)    All other components within normal limits  TROPONIN I   ____________________________________________  EKG  ED ECG REPORT I, Doran Stabler, the attending physician, personally viewed and interpreted this ECG.   Date: 04/07/2015  EKG Time: 1513  Rate: 83  Rhythm: normal sinus rhythm  Axis: Normal axis  Intervals:Incomplete right bundle-branch block., Left anterior fascicular block.  ST&T Change: No ST segment elevation or depression. No abnormal T-wave inversion. No significant change from 03/27/2015. ____________________________________________  RADIOLOGY  IMPRESSION: Resolving bilateral pulmonary artery thrombus burden with less prominent thrombus bilaterally compared to the prior CTA studies. No new pulmonary embolism is identified. Lungs show improved aeration at the bases since the prior study. Pulmonary venous hypertensive changes present without overt pulmonary edema.   Electronically Signed By: Aletta Edouard M.D. On: 04/07/2015 16:55 ____________________________________________   PROCEDURES  ____________________________________________   INITIAL IMPRESSION / ASSESSMENT AND PLAN / ED COURSE  Pertinent labs & imaging results  that were available during my care of the patient were reviewed by me and considered in my medical decision making (see chart for details).  ----------------------------------------- 6:37 PM on 04/07/2015 -----------------------------------------  Patient resting comfortably without any distress. Reviewed the labs as well as imaging with the patient. He will continue on his knowledge anticoagulant. His CTA is improved. Likely pleurisy from his PEs. ____________________________________________   FINAL CLINICAL IMPRESSION(S) / ED DIAGNOSES  Pleurisy. Pulmonary emboli, resolving.    Orbie Pyo, MD 04/07/15 [phone]

## 2015-04-07 NOTE — ED Notes (Signed)
Patient transported to CT 

## 2015-04-07 NOTE — Discharge Instructions (Signed)
Pleurisy  Pleurisy is an inflammation and swelling of the lining of the lungs (pleura). Because of this inflammation, it hurts to breathe. It can be aggravated by coughing, laughing, or deep breathing. Pleurisy is often caused by an underlying infection or disease.   HOME CARE INSTRUCTIONS   Monitor your pleurisy for any changes. The following actions may help to alleviate any discomfort you are experiencing:  · Medicine may help with pain. Only take over-the-counter or prescription medicines for pain, discomfort, or fever as directed by your health care provider.  · Only take antibiotic medicine as directed. Make sure to finish it even if you start to feel better.  SEEK MEDICAL CARE IF:   · Your pain is not controlled with medicine or is increasing.  · You have an increase in pus-like (purulent) secretions brought up with coughing.  SEEK IMMEDIATE MEDICAL CARE IF:   · You have blue or dark lips, fingernails, or toenails.  · You are coughing up blood.  · You have increased difficulty breathing.  · You have continuing pain unrelieved by medicine or pain lasting more than 1 week.  · You have pain that radiates into your neck, arms, or jaw.  · You develop increased shortness of breath or wheezing.  · You develop a fever, rash, vomiting, fainting, or other serious symptoms.  MAKE SURE YOU:  · Understand these instructions.    · Will watch your condition.    · Will get help right away if you are not doing well or get worse.        This information is not intended to replace advice given to you by your health care provider. Make sure you discuss any questions you have with your health care provider.     Document Released: 01/07/2005 Document Revised: 09/09/2012 Document Reviewed: 06/21/2012  Elsevier Interactive Patient Education ©2016 [employer] Inc[employer]

## 2015-04-10 ENCOUNTER — Encounter: Payer: Self-pay | Admitting: Internal Medicine

## 2015-04-10 ENCOUNTER — Ambulatory Visit (INDEPENDENT_AMBULATORY_CARE_PROVIDER_SITE_OTHER): Payer: Managed Care, Other (non HMO) | Admitting: Internal Medicine

## 2015-04-10 VITALS — BP 126/82 | HR 63 | Temp 98.2°F | Wt 285.0 lb

## 2015-04-10 DIAGNOSIS — R091 Pleurisy: Secondary | ICD-10-CM | POA: Diagnosis not present

## 2015-04-10 DIAGNOSIS — I480 Paroxysmal atrial fibrillation: Secondary | ICD-10-CM

## 2015-04-10 DIAGNOSIS — I2699 Other pulmonary embolism without acute cor pulmonale: Secondary | ICD-10-CM

## 2015-04-10 DIAGNOSIS — Z5189 Encounter for other specified aftercare: Secondary | ICD-10-CM

## 2015-04-10 DIAGNOSIS — I82402 Acute embolism and thrombosis of unspecified deep veins of left lower extremity: Secondary | ICD-10-CM

## 2015-04-10 NOTE — Progress Notes (Signed)
Pre visit review using our clinic review tool, if applicable. No additional management support is needed unless otherwise documented below in the visit note. 

## 2015-04-10 NOTE — Patient Instructions (Signed)

## 2015-04-10 NOTE — Progress Notes (Signed)
Subjective:    Patient ID: Richard [name], male    DOB: 03/09/1960, 55 y.o.   MRN: [id number]  HPI  Pt presents to the clinic today for TCM hospital followup. He went to the ER 3/6 with right side chest pain and shortness of breath. CT chest showed bilateral pulmonary emboli. [place] left leg demonstrated a DVT starting in the left femoral vein extending into the left popliteal vein. He was started on IV Heparin. IVC filter was placed on 2/8. He was changed from Heparin to Eliquis. He developed sudden onset SOB. Chest CT showed unchanged pulmonary emboli, with no new embolus. ECG showed rapid Afib with RVR. He was started on IV Amiodarone, eventually transitioned to PO Amiodarone. His Metoprolol was also increased to 50 mg BID. He was discharge 3/13. After discharge, he presented back to ED with acute right side chest pain. CT chest at that time showed improving pulmonary emboli with no new embolus. He was diagnosed with pleurisy.  Since discharge, he continues to have intermittent right side chest pain, not as severe. He denies shortness of breath. He denies palpitations. He is not having any issues with anything of the medications he is taking. He does have some swelling to his LLE, but not pain or redness. He takes Lasix only as needed and has not taken it in > 1 month.  He has a follow up appt with Dr. Fletcher Anon on 4/14. He has an appt with the vascular doctor 3/28, he will have an repeat ultrasound of the left leg.  Review of Systems      Past Medical History  Diagnosis Date  . Obesity   . Hypertensive heart disease   . Chest pain     a. 2015 Abnl MV;  b. 2015 Cath: nl cors.  . Chronic diastolic CHF (congestive heart failure) ([place])     a. 03/2015 Echo: EF 55-65%, poor windows, mildly dil RV.  Marland Kitchen GERD (gastroesophageal reflux disease)   . Arthritis   . Urine incontinence   . Primary localized osteoarthritis of right knee     a. 02/2014 s/p R Partial Knee arthroplasty.  . Primary localized  osteoarthritis of left knee     a. 11/2014 s/p L TKA.  Marland Kitchen OSA (obstructive sleep apnea)     a. uses CPAP nightly  . Stiffness of left knee     a. 12/2014 s/p manipulation under anesthesia.  Marland Kitchen DVT (deep venous thrombosis) ([place])     a. 03/2015 U/S: occlusive DVT w/in the distal aspect of the Left fem vein through the L popliteal vein.  . Bilateral pulmonary embolism ([place])     a. 03/2015 CTA: acute bilat PE.    Current Outpatient Prescriptions  Medication Sig Dispense Refill  . acetaminophen (TYLENOL) 325 MG tablet Take 650 mg by mouth every 6 (six) hours as needed for mild pain or headache.     Marland Kitchen amiodarone (PACERONE) 400 MG tablet Take 0.5 tablets (200 mg total) by mouth 2 (two) times daily. 70 tablet 0  . apixaban (ELIQUIS) 5 MG TABS tablet Take 1 tablet (5 mg total) by mouth 2 (two) times daily. 60 tablet 0  . aspirin EC 81 MG tablet Take 81 mg by mouth daily.    . baclofen (LIORESAL) 10 MG tablet Take 10 mg by mouth 3 (three) times daily as needed for muscle spasms.    Marland Kitchen esomeprazole (NEXIUM) 40 MG capsule Take 40 mg by mouth daily as needed (for acid reflux).     Marland Kitchen  furosemide (LASIX) 40 MG tablet Take 40 mg by mouth daily as needed for edema.    . metoprolol succinate (TOPROL-XL) 25 MG 24 hr tablet Take 0.5 tablets (12.5 mg total) by mouth 2 (two) times daily. 90 tablet 3  . mometasone-formoterol (DULERA) 200-5 MCG/ACT AERO Inhale 2 puffs into the lungs 2 (two) times daily. 1 Inhaler 0  . oxyCODONE-acetaminophen (PERCOCET/ROXICET) 5-325 MG tablet Take 1-2 tablets by mouth every 6 (six) hours as needed for severe pain.    Marland Kitchen senna-docusate (SENOKOT-S) 8.6-50 MG tablet Take 2 tablets by mouth 2 (two) times daily.     . traMADol (ULTRAM) 50 MG tablet Take 50 mg by mouth every 12 (twelve) hours as needed for moderate pain.     No current facility-administered medications for this visit.    Allergies  Allergen Reactions  . Penicillins Other (See Comments)    Reaction:  Unknown      Family History  Problem Relation Age of Onset  . Coronary artery disease Mother   . Hyperlipidemia Mother   . Hypertension Mother   . Arthritis Mother   . Coronary artery disease Father   . Heart disease Paternal Grandmother   . Alzheimer's disease Paternal Grandfather     Social History   Social History  . Marital Status: Married    Spouse Name: N/A  . Number of Children: N/A  . Years of Education: N/A   Occupational History  . tiler    Social History Main Topics  . Smoking status: Never Smoker   . Smokeless tobacco: Never Used  . Alcohol Use: Yes     Comment: occassional  . Drug Use: No  . Sexual Activity: Yes   Other Topics Concern  . Not on file   Social History Narrative     Constitutional: Pt reports fatigue. Denies fever, malaise, headache or abrupt weight changes.  Respiratory: Denies difficulty breathing, shortness of breath, cough or sputum production.   Cardiovascular: Pt reports intermittent chest pain and left leg swelling. Denies chest tightness, palpitations or swelling in the hands.  Gastrointestinal: Denies abdominal pain, bloating, constipation, diarrhea or blood in the stool.  Skin: Denies redness, rashes, lesions or ulcercations.  Neurological: Denies dizziness, difficulty with memory, difficulty with speech or problems with balance and coordination.    No other specific complaints in a complete review of systems (except as listed in HPI above).  Objective:   Physical Exam  BP 126/82 mmHg  Pulse 63  Temp(Src) 98.2 F (36.8 C) (Oral)  Wt 285 lb (129.275 kg)  SpO2 97% Wt Readings from Last 3 Encounters:  04/10/15 285 lb (129.275 kg)  04/07/15 285 lb (129.275 kg)  03/28/15 303 lb 9.2 oz (137.7 kg)    General: Appears his stated age, bese in NAD. Cardiovascular: Normal rate and rhythm. S1,S2 noted.  No murmur, rubs or gallops noted. 1+ pitting edema, LLE. Pulmonary/Chest: Normal effort and positive vesicular breath sounds. No  respiratory distress. No wheezes, rales or ronchi noted.  Neurological: Alert and oriented. Cranial nerves II-XII grossly  intact. Coordination normal.  Psychiatric: He is mildly anxious today.  BMET    Component Value Date/Time   NA 135 04/07/2015 1549   K 3.8 04/07/2015 1549   CL 103 04/07/2015 1549   CO2 23 04/07/2015 1549   GLUCOSE 107* 04/07/2015 1549   BUN 22* 04/07/2015 1549   CREATININE 1.16 04/07/2015 1549   CALCIUM 9.0 04/07/2015 1549   GFRNONAA >60 04/07/2015 1549   GFRAA >60 04/07/2015  1549    Lipid Panel     Component Value Date/Time   CHOL 124 03/29/2015 0434   TRIG 71 03/29/2015 0434   HDL 30* 03/29/2015 0434   CHOLHDL 4.1 03/29/2015 0434   VLDL 14 03/29/2015 0434   LDLCALC 80 03/29/2015 0434    CBC    Component Value Date/Time   WBC 11.3* 04/07/2015 1549   RBC 4.96 04/07/2015 1549   HGB 13.0 04/07/2015 1549   HCT 40.0 04/07/2015 1549   PLT 318 04/07/2015 1549   MCV 80.6 04/07/2015 1549   MCH 26.3 04/07/2015 1549   MCHC 32.6 04/07/2015 1549   RDW 15.7* 04/07/2015 1549   LYMPHSABS 2.2 04/07/2015 1549   MONOABS 1.1* 04/07/2015 1549   EOSABS 0.1 04/07/2015 1549   BASOSABS 0.2* 04/07/2015 1549    Hgb A1C Lab Results  Component Value Date   HGBA1C 6.0 03/02/2013         Assessment & Plan:  Hospital follow up for bilateral pulmonary embolism, DVT left leg, Afib with RVR and pleurisy:  Hospital notes, labs, procedures and imaging reviewed Left leg pain has resolved Left leg swelling- advised him to take his Lasix daily x 3 days No SOB He is to continue Amiodarone, ASA, Eliquis, Lasix and Metoprolol as prescribed He will follow up with cardiology 05/05/15 He will follow up with vascular next week  RTC in 3 months, sooner if needed

## 2015-04-11 ENCOUNTER — Encounter: Payer: Self-pay | Admitting: Emergency Medicine

## 2015-04-11 ENCOUNTER — Emergency Department
Admission: EM | Admit: 2015-04-11 | Discharge: 2015-04-11 | Disposition: A | Discharge status: Home / Self Care | Payer: Managed Care, Other (non HMO) | Attending: Emergency Medicine | Admitting: Emergency Medicine

## 2015-04-11 ENCOUNTER — Telehealth: Payer: Self-pay | Admitting: Cardiovascular Disease

## 2015-04-11 ENCOUNTER — Other Ambulatory Visit: Payer: Self-pay

## 2015-04-11 DIAGNOSIS — I5032 Chronic diastolic (congestive) heart failure: Secondary | ICD-10-CM | POA: Diagnosis not present

## 2015-04-11 DIAGNOSIS — Z79899 Other long term (current) drug therapy: Secondary | ICD-10-CM | POA: Diagnosis not present

## 2015-04-11 DIAGNOSIS — I480 Paroxysmal atrial fibrillation: Secondary | ICD-10-CM | POA: Insufficient documentation

## 2015-04-11 DIAGNOSIS — Z7982 Long term (current) use of aspirin: Secondary | ICD-10-CM | POA: Insufficient documentation

## 2015-04-11 DIAGNOSIS — Z7901 Long term (current) use of anticoagulants: Secondary | ICD-10-CM | POA: Diagnosis not present

## 2015-04-11 DIAGNOSIS — I110 Hypertensive heart disease with heart failure: Secondary | ICD-10-CM | POA: Diagnosis not present

## 2015-04-11 DIAGNOSIS — Z7951 Long term (current) use of inhaled steroids: Secondary | ICD-10-CM | POA: Diagnosis not present

## 2015-04-11 DIAGNOSIS — Z880 Allergy status to penicillin: Secondary | ICD-10-CM | POA: Diagnosis not present

## 2015-04-11 DIAGNOSIS — R002 Palpitations: Secondary | ICD-10-CM | POA: Diagnosis present

## 2015-04-11 LAB — BASIC METABOLIC PANEL
Anion gap: 9 (ref 5–15)
BUN: 14 mg/dL (ref 6–20)
CO2: 20 mmol/L — ABNORMAL LOW (ref 22–32)
Calcium: 8.5 mg/dL — ABNORMAL LOW (ref 8.9–10.3)
Chloride: 102 mmol/L (ref 101–111)
Creatinine, Ser: 1.26 mg/dL — ABNORMAL HIGH (ref 0.61–1.24)
GFR calc Af Amer: >60 mL/min (ref >60)
GFR calc non Af Amer: >60 mL/min (ref >60)
Glucose, Bld: 104 mg/dL — ABNORMAL HIGH (ref 65–99)
Potassium: 3.3 mmol/L — ABNORMAL LOW (ref 3.5–5.1)
Sodium: 131 mmol/L — ABNORMAL LOW (ref 135–145)

## 2015-04-11 LAB — CBC
HCT: 38.3 % — ABNORMAL LOW (ref 40.0–52.0)
Hemoglobin: 12.6 g/dL — ABNORMAL LOW (ref 13.0–18.0)
MCH: 26 pg (ref 26.0–34.0)
MCHC: 32.9 g/dL (ref 32.0–36.0)
MCV: 79 fL — ABNORMAL LOW (ref 80.0–100.0)
Platelets: 353 10*3/uL (ref 150–440)
RBC: 4.85 MIL/uL (ref 4.40–5.90)
RDW: 15.7 % — ABNORMAL HIGH (ref 11.5–14.5)
WBC: 11.7 10*3/uL — ABNORMAL HIGH (ref 3.8–10.6)

## 2015-04-11 LAB — TROPONIN I: Troponin I: <0.03 ng/mL (ref <0.031)

## 2015-04-11 NOTE — Telephone Encounter (Signed)
Dr. Rockey Situ reviewed ED notes and is agreeable w/pt increasing metoprolol to 25mg  BID w/extra dose of amiodarone PRN for afib. S/w pt regarding recommendations. Pt is agreeable w/plan and will continue to monitor. He does not have BP cuff at home. Encouraged pt to purchase one and monitor as coreg has been increased. He has been placed on wait list as appt w/Dr. Fletcher Anon is April 14. Pt verbalized understanding of instructions.

## 2015-04-11 NOTE — ED Provider Notes (Signed)
[facility] Emergency Department Provider Note  ____________________________________________  Time seen: Approximately 0047 AM  I have reviewed the triage vital signs and the nursing notes.   HISTORY  Chief Complaint Atrial Fibrillation    HPI Richard [name] is a 55 y.o. male with a history of atrial fibrillation as well as PE who was in the ICU 2 weeks ago with chest pain and found out that he had a DVT with PE. The patient had an IVC filter placed and was in the ICU for multiple days. He was diagnosed at that time with atrophic relation and placed on medication. The patient reports that he had been doing well but tonight when he was getting ready for bed he felt a burning in his head. The patient checked his heart rate and it was 135. He went to the fire station and his family reports that his heart rate was up to 150 so came in to get checked out. As the patient was on his way in with EMS his rhythm changed back to normal. The patient was nervous given his recent stay in the hospital so he decided to come into the hospital to get checked out. He denies any chest pain or shortness of breath. The patient has no headache at this time no nausea vomiting or abdominal pain.   Past Medical History  Diagnosis Date  . Obesity   . Hypertensive heart disease   . Chest pain     a. 2015 Abnl MV;  b. 2015 Cath: nl cors.  . Chronic diastolic CHF (congestive heart failure) ([place])     a. 03/2015 Echo: EF 55-65%, poor windows, mildly dil RV.  Marland Kitchen GERD (gastroesophageal reflux disease)   . Arthritis   . Urine incontinence   . Primary localized osteoarthritis of right knee     a. 02/2014 s/p R Partial Knee arthroplasty.  . Primary localized osteoarthritis of left knee     a. 11/2014 s/p L TKA.  Marland Kitchen OSA (obstructive sleep apnea)     a. uses CPAP nightly  . Stiffness of left knee     a. 12/2014 s/p manipulation under anesthesia.  Marland Kitchen DVT (deep venous thrombosis) ([place])     a.  03/2015 U/S: occlusive DVT w/in the distal aspect of the Left fem vein through the L popliteal vein.  . Bilateral pulmonary embolism ([place])     a. 03/2015 CTA: acute bilat PE.    Patient Active Problem List   Diagnosis Date Noted  . PAF (paroxysmal atrial fibrillation) ([place]) 03/30/2015  . Peroneal DVT (deep venous thrombosis) ([place])   . Pulmonary embolism ([place]) 03/27/2015  . Primary localized osteoarthritis of left knee 12/06/2014  . Primary localized osteoarthritis of right knee 02/25/2014  . Arthritis 03/16/2013  . Hypertension 08/17/2011  . LVH (left ventricular hypertrophy) 08/17/2011  . GERD (gastroesophageal reflux disease) 08/17/2011  . Morbid obesity ([place]) 08/16/2011  . OSA (obstructive sleep apnea) 08/16/2011    Past Surgical History  Procedure Laterality Date  . Appendectomy    . Orthopedic surgery Left     arthroscopy x3  . Left heart catheterization with coronary angiogram N/A 01/12/2014    Procedure: LEFT HEART CATHETERIZATION WITH CORONARY ANGIOGRAM;  Surgeon: Jettie Booze, MD;  Location: [facility] CATH LAB;  Service: Cardiovascular;  Laterality: N/A;  . Partial knee arthroplasty Right 02/25/2014    Procedure: RIGHT KNEE ARTHROPLASTY CONDYLE AND PLATEAU MEDIAL COMPARTMENT ;  Surgeon: Johnny Bridge, MD;  Location: [place];  Service: Orthopedics;  Laterality: Right;  . Joint replacement    . Total knee arthroplasty  12/06/2014    Procedure: TOTAL KNEE ARTHROPLASTY;  Surgeon: Marchia Bond, MD;  Location: [place];  Service: Orthopedics;;  . Knee closed reduction Left 01/20/2015    Procedure: LEFT KNEE MANIPULATION;  Surgeon: Marchia Bond, MD;  Location: [place];  Service: Orthopedics;  Laterality: Left;  . Peripheral vascular catheterization N/A 03/29/2015    Procedure: IVC Filter Insertion, and possible thrombectomy;  Surgeon: Katha Cabal, MD;  Location: [place] CV LAB;  Service: Cardiovascular;  Laterality: N/A;    Current  Outpatient Rx  Name  Route  Sig  Dispense  Refill  . acetaminophen (TYLENOL) 325 MG tablet   Oral   Take 650 mg by mouth every 6 (six) hours as needed for mild pain or headache.          Marland Kitchen amiodarone (PACERONE) 400 MG tablet   Oral   Take 0.5 tablets (200 mg total) by mouth 2 (two) times daily.   70 tablet   0     Take 400 mg by mouth twice a day until March 17 fo ...   . apixaban (ELIQUIS) 5 MG TABS tablet   Oral   Take 1 tablet (5 mg total) by mouth 2 (two) times daily.   60 tablet   0     10 mg by mouth twice a day until March 16 and star ...   . aspirin EC 81 MG tablet   Oral   Take 81 mg by mouth daily.         . baclofen (LIORESAL) 10 MG tablet   Oral   Take 10 mg by mouth 3 (three) times daily as needed for muscle spasms.         Marland Kitchen esomeprazole (NEXIUM) 40 MG capsule   Oral   Take 40 mg by mouth daily as needed (for acid reflux).          . furosemide (LASIX) 40 MG tablet   Oral   Take 40 mg by mouth daily as needed for edema.         . metoprolol succinate (TOPROL-XL) 25 MG 24 hr tablet   Oral   Take 0.5 tablets (12.5 mg total) by mouth 2 (two) times daily.   90 tablet   3   . mometasone-formoterol (DULERA) 200-5 MCG/ACT AERO   Inhalation   Inhale 2 puffs into the lungs 2 (two) times daily.   1 Inhaler   0   . oxyCODONE-acetaminophen (PERCOCET/ROXICET) 5-325 MG tablet   Oral   Take 1-2 tablets by mouth every 6 (six) hours as needed for severe pain.         Marland Kitchen senna-docusate (SENOKOT-S) 8.6-50 MG tablet   Oral   Take 2 tablets by mouth 2 (two) times daily.          . traMADol (ULTRAM) 50 MG tablet   Oral   Take 50 mg by mouth every 12 (twelve) hours as needed for moderate pain.           Allergies Penicillins  Family History  Problem Relation Age of Onset  . Coronary artery disease Mother   . Hyperlipidemia Mother   . Hypertension Mother   . Arthritis Mother   . Coronary artery disease Father   . Heart disease Paternal  Grandmother   . Alzheimer's disease Paternal Grandfather     Social History Social History  Substance  Use Topics  . Smoking status: Never Smoker   . Smokeless tobacco: Never Used  . Alcohol Use: Yes     Comment: occassional    Review of Systems Constitutional: No fever/chills Eyes: No visual changes. ENT: No sore throat. Cardiovascular: Denies chest pain. Respiratory: Palpitations Gastrointestinal: No abdominal pain.  No nausea, no vomiting.  No diarrhea.  No constipation. Genitourinary: Negative for dysuria. Musculoskeletal: Negative for back pain. Skin: Negative for rash. Neurological: No headache, weakness or dizziness  10-point ROS otherwise negative.  ____________________________________________   PHYSICAL EXAM:  VITAL SIGNS: ED Triage Vitals  Enc Vitals Group     BP 04/11/15 0054 126/78 mmHg     Pulse Rate 04/11/15 0054 88     Resp 04/11/15 0054 15     Temp 04/11/15 0054 98 F (36.7 C)     Temp Source 04/11/15 0054 Oral     SpO2 04/11/15 0046 98 %     Weight 04/11/15 0054 283 lb (128.368 kg)     Height 04/11/15 0054 5\' 9"  (1.753 m)     Head Cir --      Peak Flow --      Pain Score --      Pain Loc --      Pain Edu? --      Excl. in [place]? --     Constitutional: Alert and oriented. Well appearing and in no acute distress. Eyes: Conjunctivae are normal. PERRL. EOMI. Head: Atraumatic. Nose: No congestion/rhinnorhea. Mouth/Throat: Mucous membranes are moist.  Oropharynx non-erythematous. Cardiovascular: Normal rate, regular rhythm. Grossly normal heart sounds.  Good peripheral circulation. Respiratory: Normal respiratory effort.  No retractions. Lungs CTAB. Gastrointestinal: Soft and nontender. No distention. Positive bowel sounds Musculoskeletal: No lower extremity tenderness nor edema.   Neurologic:  Normal speech and language.  Skin:  Skin is warm, dry and intact.  Psychiatric: Mood normal  ____________________________________________   LABS (all  labs ordered are listed, but only abnormal results are displayed)  Labs Reviewed  CBC - Abnormal; Notable for the following:    WBC 11.7 (*)    Hemoglobin 12.6 (*)    HCT 38.3 (*)    MCV 79.0 (*)    RDW 15.7 (*)    All other components within normal limits  BASIC METABOLIC PANEL - Abnormal; Notable for the following:    Sodium 131 (*)    Potassium 3.3 (*)    CO2 20 (*)    Glucose, Bld 104 (*)    Creatinine, Ser 1.26 (*)    Calcium 8.5 (*)    All other components within normal limits  TROPONIN I   ____________________________________________  EKG  ED ECG REPORT I, Loney Hering, the attending physician, personally viewed and interpreted this ECG.   Date: 04/11/2015  EKG Time: 138  Rate: 76  Rhythm: normal sinus rhythm  Axis: normal  Intervals:none  ST&T Change: none  ____________________________________________  RADIOLOGY  None ____________________________________________   PROCEDURES  Procedure(s) performed: None  Critical Care performed: No  ____________________________________________   INITIAL IMPRESSION / ASSESSMENT AND PLAN / ED COURSE  Pertinent labs & imaging results that were available during my care of the patient were reviewed by me and considered in my medical decision making (see chart for details).  This is a 55 year old male who comes into the hospital today with some palpitations. The patient does have a history of paroxysmal A. fib which it appears he entered tonight. The patient's heart rate was improved without any medication or  treatment. The patient was here for some time and he did not have any further episodes of A. fib. I contacted Dr.Nishan who recommended increasing the patient's metoprolol dose to 25 mg twice a day. I discussed this with the patient but I also told him that he should follow-up with Dr. Rockey Situ to ensure that that's the appropriate treatment. Patient is having no concerns or distress at this time. He will be  discharged to home. ____________________________________________   FINAL CLINICAL IMPRESSION(S) / ED DIAGNOSES  Final diagnoses:  Paroxysmal atrial fibrillation (HCC)      Loney Hering, MD 04/11/15 [phone]

## 2015-04-11 NOTE — Discharge Instructions (Signed)
I have spoken with Dr. Johnsie Cancel from [facility] and his recommendation was to increase your metoprolol dose from 12.5mg  (1/2 tab) twice a day to 25mg  (1 tab) twice a day. I would like for your to follow up with Dr. Rockey Situ tomorrow to discuss the medication increase and further review your vital signs and symptoms.   Atrial Fibrillation Atrial fibrillation is a type of irregular or rapid heartbeat (arrhythmia). In atrial fibrillation, the heart quivers continuously in a chaotic pattern. This occurs when parts of the heart receive disorganized signals that make the heart unable to pump blood normally. This can increase the risk for stroke, heart failure, and other heart-related conditions. There are different types of atrial fibrillation, including:  Paroxysmal atrial fibrillation. This type starts suddenly, and it usually stops on its own shortly after it starts.  Persistent atrial fibrillation. This type often lasts longer than a week. It may stop on its own or with treatment.  Long-lasting persistent atrial fibrillation. This type lasts longer than 12 months.  Permanent atrial fibrillation. This type does not go away. Talk with your health care provider to learn about the type of atrial fibrillation that you have. CAUSES This condition is caused by some heart-related conditions or procedures, including:  A heart attack.  Coronary artery disease.  Heart failure.  Heart valve conditions.  High blood pressure.  Inflammation of the sac that surrounds the heart (pericarditis).  Heart surgery.  Certain heart rhythm disorders, such as Wolf-Parkinson-White syndrome. Other causes include:  Pneumonia.  Obstructive sleep apnea.  Blockage of an artery in the lungs (pulmonary embolism, or PE).  Lung cancer.  Chronic lung disease.  Thyroid problems, especially if the thyroid is overactive (hyperthyroidism).  Caffeine.  Excessive alcohol use or illegal drug use.  Use  of some medicines, including certain decongestants and diet pills. Sometimes, the cause cannot be found. RISK FACTORS This condition is more likely to develop in:  People who are older in age.  People who smoke.  People who have diabetes mellitus.  People who are overweight (obese).  Athletes who exercise vigorously. SYMPTOMS Symptoms of this condition include:  A feeling that your heart is beating rapidly or irregularly.  A feeling of discomfort or pain in your chest.  Shortness of breath.  Sudden light-headedness or weakness.  Getting tired easily during exercise. In some cases, there are no symptoms. DIAGNOSIS Your health care provider may be able to detect atrial fibrillation when taking your pulse. If detected, this condition may be diagnosed with:  An electrocardiogram (ECG).  A Holter monitor test that records your heartbeat patterns over a 24-hour period.  Transthoracic echocardiogram (TTE) to evaluate how blood flows through your heart.  Transesophageal echocardiogram (TEE) to view more detailed images of your heart.  A stress test.  Imaging tests, such as a CT scan or chest X-ray.  Blood tests. TREATMENT The main goals of treatment are to prevent blood clots from forming and to keep your heart beating at a normal rate and rhythm. The type of treatment that you receive depends on many factors, such as your underlying medical conditions and how you feel when you are experiencing atrial fibrillation. This condition may be treated with:  Medicine to slow down the heart rate, bring the heart's rhythm back to normal, or prevent clots from forming.  Electrical cardioversion. This is a procedure that resets your heart's rhythm by delivering a controlled, low-energy shock to the heart through your skin.  Different types of  ablation, such as catheter ablation, catheter ablation with pacemaker, or surgical ablation. These procedures destroy the heart tissues that  send abnormal signals. When the pacemaker is used, it is placed under your skin to help your heart beat in a regular rhythm. HOME CARE INSTRUCTIONS  Take over-the counter and prescription medicines only as told by your health care provider.  If your health care provider prescribed a blood-thinning medicine (anticoagulant), take it exactly as told. Taking too much blood-thinning medicine can cause bleeding. If you do not take enough blood-thinning medicine, you will not have the protection that you need against stroke and other problems.  Do not use tobacco products, including cigarettes, chewing tobacco, and e-cigarettes. If you need help quitting, ask your health care provider.  If you have obstructive sleep apnea, manage your condition as told by your health care provider.  Do not drink alcohol.  Do not drink beverages that contain caffeine, such as coffee, soda, and tea.  Maintain a healthy weight. Do not use diet pills unless your health care provider approves. Diet pills may make heart problems worse.  Follow diet instructions as told by your health care provider.  Exercise regularly as told by your health care provider.  Keep all follow-up visits as told by your health care provider. This is important. PREVENTION  Avoid drinking beverages that contain caffeine or alcohol.  Avoid certain medicines, especially medicines that are used for breathing problems.  Avoid certain herbs and herbal medicines, such as those that contain ephedra or ginseng.  Do not use illegal drugs, such as cocaine and amphetamines.  Do not smoke.  Manage your high blood pressure. SEEK MEDICAL CARE IF:  You notice a change in the rate, rhythm, or strength of your heartbeat.  You are taking an anticoagulant and you notice increased bruising.  You tire more easily when you exercise or exert yourself. SEEK IMMEDIATE MEDICAL CARE IF:  You have chest pain, abdominal pain, sweating, or weakness.  You  feel nauseous.  You notice blood in your vomit, bowel movement, or urine.  You have shortness of breath.  You suddenly have swollen feet and ankles.  You feel dizzy.  You have sudden weakness or numbness of the face, arm, or leg, especially on one side of the body.  You have trouble speaking, trouble understanding, or both (aphasia).  Your face or your eyelid droops on one side. These symptoms may represent a serious problem that is an emergency. Do not wait to see if the symptoms will go away. Get medical help right away. Call your local emergency services (911 in the [place] Do not drive yourself to the hospital.   This information is not intended to replace advice given to you by your health care provider. Make sure you discuss any questions you have with your health care provider.   Document Released: 01/07/2005 Document Revised: 09/28/2014 Document Reviewed: 05/04/2014 Elsevier Interactive Patient Education [employer].

## 2015-04-11 NOTE — Telephone Encounter (Signed)
Pt was seen in hospital and is coming 05/05/15 to see Dr Fletcher Anon

## 2015-04-11 NOTE — Telephone Encounter (Signed)
Attempted TCM call to patient.  No answer, no VM set up.  Will call back

## 2015-04-11 NOTE — Telephone Encounter (Signed)
S/w pt who reports he presented to the ER yesterday for elevated HR. Pt was found to be afib RVR. Per ER MD notes: "The patient does have a history of paroxysmal A. fib which it appears he entered tonight. The patient's heart rate was improved without any medication or treatment. The patient was here for some time and he did not have any further episodes of A. fib. I contacted Dr.Nishan who recommended increasing the patient's metoprolol dose to 25 mg twice a day. I discussed this with the patient but I also told him that he should follow-up with Dr. Rockey Situ to ensure that that's the appropriate treatment. Patient is having no concerns or distress at this time. He will be discharged to home."  Pt states he was told by ER MD to follow up with cardiology for an appointment today. Pt is scheduled to see Dr. Fletcher Anon April 14.  Advised pt to follow d/c instructions and I will f/u with Dr. Rockey Situ regarding increasing metoprolol and will call him back with recommendations. We are unable to provide pt w/appt today. Pt states he wants an appt today because he doesn't want to "sit around waiting for it to happen again". Pt promptly hung up the phone.

## 2015-04-11 NOTE — ED Notes (Signed)
Pt arrived from fire department by EMS with Afib. EMS reports pt was at home sitting when he had a sudden cold sweat come over him, pt drove self to local fire dept where there was a Runner, broadcasting/film/video present. Pt was in Afib, EMS called, pt converted in route without medication. Pt states he was in the ICU 1 week ago after developing a PE, post knee surgery. Pt seems anxious about health and current circumstances. Pt takes Eliquis, metoprolol, and Amiodarone.

## 2015-04-13 HISTORY — PX: IVC FILTER PLACEMENT (ARMC HX): HXRAD1551

## 2015-04-14 ENCOUNTER — Telehealth: Payer: Self-pay | Admitting: Cardiovascular Disease

## 2015-04-14 NOTE — Telephone Encounter (Signed)
S/w pt regarding April 7 opening w/Dr. Fletcher Anon. Pt would like to move April 14 appt to new date, 10am. Pt states he has been feeling well. He increased metoprolol as instructed by Dr. Johnsie Cancel and Dr. Rockey Situ. Pt dropped off "Questionnaire for Social Security Disability" letter from [place], [place], [place] yesterday. Informed pt this could not be completed until his OV in April. Pt verbalized understanding and is agreeable w/plan.

## 2015-04-24 ENCOUNTER — Other Ambulatory Visit: Payer: Self-pay | Admitting: Internal Medicine

## 2015-04-25 NOTE — Telephone Encounter (Signed)
He should not take this while on Eliquis

## 2015-04-25 NOTE — Telephone Encounter (Signed)
Last filled 03/22/2015--please advise

## 2015-04-28 ENCOUNTER — Encounter (INDEPENDENT_AMBULATORY_CARE_PROVIDER_SITE_OTHER): Payer: Self-pay

## 2015-04-28 ENCOUNTER — Ambulatory Visit (INDEPENDENT_AMBULATORY_CARE_PROVIDER_SITE_OTHER): Payer: Managed Care, Other (non HMO) | Admitting: Cardiovascular Disease

## 2015-04-28 ENCOUNTER — Encounter: Payer: Self-pay | Admitting: Cardiovascular Disease

## 2015-04-28 VITALS — BP 140/100 | HR 67 | Ht 69.0 in | Wt 283.2 lb

## 2015-04-28 DIAGNOSIS — I480 Paroxysmal atrial fibrillation: Secondary | ICD-10-CM | POA: Diagnosis not present

## 2015-04-28 DIAGNOSIS — I10 Essential (primary) hypertension: Secondary | ICD-10-CM | POA: Diagnosis not present

## 2015-04-28 MED ORDER — AMIODARONE HCL 200 MG PO TABS: 200.0000 mg | ORAL_TABLET | Freq: Every day | ORAL | Status: DC

## 2015-04-28 MED ORDER — METOPROLOL SUCCINATE ER 25 MG PO TB24: 25.0000 mg | ORAL_TABLET | Freq: Two times a day (BID) | ORAL | Status: DC

## 2015-04-28 NOTE — Patient Instructions (Signed)
Medication Instructions:  Your physician has recommended you make the following change in your medication:  STOP taking aspirin DECREASE amiodarone to 200mg  once daily    Labwork: none  Testing/Procedures: none  Follow-Up: Your physician recommends that you schedule a follow-up appointment in: one month with Dr. Fletcher Anon.    Any Other Special Instructions Will Be Listed Below (If Applicable).     If you need a refill on your cardiac medications before your next appointment, please call your pharmacy.

## 2015-04-30 ENCOUNTER — Encounter: Payer: Self-pay | Admitting: Cardiovascular Disease

## 2015-04-30 ENCOUNTER — Other Ambulatory Visit: Payer: Self-pay | Admitting: Cardiology

## 2015-04-30 ENCOUNTER — Telehealth: Payer: Self-pay | Admitting: Cardiology

## 2015-04-30 DIAGNOSIS — I480 Paroxysmal atrial fibrillation: Secondary | ICD-10-CM

## 2015-04-30 MED ORDER — APIXABAN 5 MG PO TABS: 5.0000 mg | ORAL_TABLET | Freq: Two times a day (BID) | ORAL | Status: DC

## 2015-04-30 NOTE — Telephone Encounter (Signed)
Pt out of Eliquis, I called [facility] and reordered.

## 2015-04-30 NOTE — Progress Notes (Signed)
Cardiology Office Note   Date:  04/30/2015   ID:  Richard [name], DOB 07/28/1960, MRN [id number]  PCP:  Webb Silversmith, NP  Cardiologist:   Kathlyn Sacramento, MD   Chief Complaint  Patient presents with  . other    Follow up from [facility]; s/p knee surgery with developing a blood clot after surgery. Meds reviewed by the patient verbally.       History of Present Illness: Richard [name] is a 55 y.o. male who presents for A follow-up visit regarding atrial fibrillation in the setting of massive pulmonary embolism. The patient has history of normal cardiac catheterization in 2015 after an abnormal stress test, hypertension, obesity, osteoarthritis status post bilateral knee surgeries. The patient underwent right partial knee arthroplasty in February 2016 followed by left TKA in November 2016. His mobility was limited after that and he had the flu in February. In early March, he presented with sudden onset of sharp right sided chest pain which was pleuritic in nature. He came to the emergency room at [facility] and was diagnosed with bilateral pulmonary embolism. Lower extremity ultrasound showed left lower extremity DVT. Echo showed normal LV systolic function with mildly dilated right ventricle. The patient's clinical condition worsened and he developed atrial fibrillation with rapid ventricular response. He was placed on amiodarone and underwent catheter-based intervention for his pulmonary embolism as well as IVC filter placement. His condition gradually improved and he was discharged home on anticoagulation with Eliquis. He had recurrent palpitations on March 21 and went to the emergency room. By the time he arrived to the emergency room he was in normal sinus rhythm. The dose of metoprolol was increased to 25 mg twice daily. The patient has been doing reasonably well since then with no further palpitations. He continues to complain of left calf pain. He still has some residual dyspnea but it is  improving. He has mild pleuritic chest pain.    Past Medical History  Diagnosis Date  . Obesity   . Hypertensive heart disease   . Chest pain     a. 2015 Abnl MV;  b. 2015 Cath: nl cors.  . Chronic diastolic CHF (congestive heart failure) ([place])     a. 03/2015 Echo: EF 55-65%, poor windows, mildly dil RV.  Marland Kitchen GERD (gastroesophageal reflux disease)   . Arthritis   . Urine incontinence   . Primary localized osteoarthritis of right knee     a. 02/2014 s/p R Partial Knee arthroplasty.  . Primary localized osteoarthritis of left knee     a. 11/2014 s/p L TKA.  Marland Kitchen OSA (obstructive sleep apnea)     a. uses CPAP nightly  . Stiffness of left knee     a. 12/2014 s/p manipulation under anesthesia.  Marland Kitchen DVT (deep venous thrombosis) ([place])     a. 03/2015 U/S: occlusive DVT w/in the distal aspect of the Left fem vein through the L popliteal vein.  . Bilateral pulmonary embolism ([place])     a. 03/2015 CTA: acute bilat PE.    Past Surgical History  Procedure Laterality Date  . Appendectomy    . Orthopedic surgery Left     arthroscopy x3  . Left heart catheterization with coronary angiogram N/A 01/12/2014    Procedure: LEFT HEART CATHETERIZATION WITH CORONARY ANGIOGRAM;  Surgeon: Jettie Booze, MD;  Location: [facility] CATH LAB;  Service: Cardiovascular;  Laterality: N/A;  . Partial knee arthroplasty Right 02/25/2014    Procedure: RIGHT KNEE ARTHROPLASTY CONDYLE AND PLATEAU MEDIAL  COMPARTMENT ;  Surgeon: Johnny Bridge, MD;  Location: [place];  Service: Orthopedics;  Laterality: Right;  . Joint replacement    . Total knee arthroplasty  12/06/2014    Procedure: TOTAL KNEE ARTHROPLASTY;  Surgeon: Marchia Bond, MD;  Location: [place];  Service: Orthopedics;;  . Knee closed reduction Left 01/20/2015    Procedure: LEFT KNEE MANIPULATION;  Surgeon: Marchia Bond, MD;  Location: [place];  Service: Orthopedics;  Laterality: Left;  . Peripheral vascular catheterization N/A  03/29/2015    Procedure: IVC Filter Insertion, and possible thrombectomy;  Surgeon: Katha Cabal, MD;  Location: [place] CV LAB;  Service: Cardiovascular;  Laterality: N/A;     Current Outpatient Prescriptions  Medication Sig Dispense Refill  . acetaminophen (TYLENOL) 325 MG tablet Take 650 mg by mouth every 6 (six) hours as needed for mild pain or headache.     . baclofen (LIORESAL) 10 MG tablet Take 10 mg by mouth 3 (three) times daily as needed for muscle spasms.    Marland Kitchen esomeprazole (NEXIUM) 40 MG capsule Take 40 mg by mouth daily as needed (for acid reflux).     . furosemide (LASIX) 40 MG tablet Take 40 mg by mouth daily as needed for edema.    . mometasone-formoterol (DULERA) 200-5 MCG/ACT AERO Inhale 2 puffs into the lungs 2 (two) times daily. 1 Inhaler 0  . oxyCODONE-acetaminophen (PERCOCET/ROXICET) 5-325 MG tablet Take 1-2 tablets by mouth every 6 (six) hours as needed for severe pain.    Marland Kitchen senna-docusate (SENOKOT-S) 8.6-50 MG tablet Take 2 tablets by mouth 2 (two) times daily.     . traMADol (ULTRAM) 50 MG tablet Take 50 mg by mouth every 12 (twelve) hours as needed for moderate pain.    Marland Kitchen amiodarone (PACERONE) 200 MG tablet Take 1 tablet (200 mg total) by mouth daily. 30 tablet 3  . apixaban (ELIQUIS) 5 MG TABS tablet Take 1 tablet (5 mg total) by mouth 2 (two) times daily. 60 tablet 6  . metoprolol succinate (TOPROL-XL) 25 MG 24 hr tablet Take 1 tablet (25 mg total) by mouth 2 (two) times daily. Take with or immediately following a meal. 60 tablet 3   No current facility-administered medications for this visit.    Allergies:   Penicillins    Social History:  The patient  reports that he has never smoked. He has never used smokeless tobacco. He reports that he drinks alcohol. He reports that he does not use illicit drugs.   Family History:  The patient's family history includes Alzheimer's disease in his paternal grandfather; Arthritis in his mother; Coronary artery  disease in his father and mother; Heart disease in his paternal grandmother; Hyperlipidemia in his mother; Hypertension in his mother.    ROS:  Please see the history of present illness.   Otherwise, review of systems are positive for none.   All other systems are reviewed and negative.    PHYSICAL EXAM: VS:  BP 140/100 mmHg  Pulse 67  Ht 5\' 9"  (1.753 m)  Wt 283 lb 4 oz (128.481 kg)  BMI 41.81 kg/m2 , BMI Body mass index is 41.81 kg/(m^2). GEN: Well nourished, well developed, in no acute distress HEENT: normal Neck: no JVD, carotid bruits, or masses Cardiac: RRR; no murmurs, rubs, or gallops,no edema  Respiratory:  clear to auscultation bilaterally, normal work of breathing GI: soft, nontender, nondistended, + BS MS: no deformity or atrophy Skin: warm and dry, no rash Neuro:  Strength and sensation are intact Psych: euthymic mood, full affect   EKG:  EKG is ordered today. The ekg ordered today demonstrates normal sinus rhythm with left anterior fascicular block.   Recent Labs: 03/29/2015: Magnesium 2.0 03/30/2015: TSH 1.741 04/11/2015: BUN 14; Creatinine, Ser 1.26*; Hemoglobin 12.6*; Platelets 353; Potassium 3.3*; Sodium 131*    Lipid Panel    Component Value Date/Time   CHOL 124 03/29/2015 0434   TRIG 71 03/29/2015 0434   HDL 30* 03/29/2015 0434   CHOLHDL 4.1 03/29/2015 0434   VLDL 14 03/29/2015 0434   LDLCALC 80 03/29/2015 0434      Wt Readings from Last 3 Encounters:  04/28/15 283 lb 4 oz (128.481 kg)  04/11/15 283 lb (128.368 kg)  04/10/15 285 lb (129.275 kg)       ASSESSMENT AND PLAN:  1.  Paroxysmal atrial fibrillation: This was in the setting of massive pulmonary embolism. He did have recurrent palpitations shortly after hospital discharge. Thus, I elected not to discontinue amiodarone today. I decreased the dose to 200 mg once daily. The goal is to discontinue amiodarone in few months and monitor his symptoms. His atrial fibrillation might be temporary.  Continue anticoagulation with Eliquis. Stop aspirin to decrease the risk of bleeding.  2. Pulmonary embolism: Due to left lower extremity DVT after left knee replacement. Given the burden of his pulmonary embolism, the patient might require lifelong anticoagulation.     Disposition:   FU with me in 1 month  Signed,  Kathlyn Sacramento, MD  04/30/2015 5:17 PM    [place]

## 2015-05-01 ENCOUNTER — Telehealth: Payer: Self-pay | Admitting: Cardiovascular Disease

## 2015-05-01 ENCOUNTER — Other Ambulatory Visit: Payer: Self-pay | Admitting: Internal Medicine

## 2015-05-01 MED ORDER — METOPROLOL SUCCINATE ER 25 MG PO TB24: 25.0000 mg | ORAL_TABLET | Freq: Two times a day (BID) | ORAL | Status: DC

## 2015-05-01 NOTE — Telephone Encounter (Signed)
Not on med list but last refilled on 03/22/15 #30 with 0 refills, please advise

## 2015-05-01 NOTE — Telephone Encounter (Signed)
He should not take while on Eliquis

## 2015-05-01 NOTE — Telephone Encounter (Signed)
Refill sent for Toprol 25 mg

## 2015-05-01 NOTE — Telephone Encounter (Signed)
°*  STAT* If patient is at the pharmacy, call can be transferred to refill team.   1. Which medications need to be refilled? (please list name of each medication and dose if known) Toprol xl 25 mg po x 2 daily   2. Which pharmacy/location (including street and city if local pharmacy) is medication to be sent to? [facility] [address] [place]  3. Do they need a 30 day or 90 day supply?  [place]

## 2015-05-05 ENCOUNTER — Encounter: Payer: Managed Care, Other (non HMO) | Admitting: Cardiovascular Disease

## 2015-05-09 ENCOUNTER — Telehealth: Payer: Self-pay | Admitting: Cardiovascular Disease

## 2015-05-09 NOTE — Telephone Encounter (Signed)
Cardiac clearance from [facility] received. Pt having left knee manipulation, surgery date pending. Placed in Dr. Tyrell Antonio in basket

## 2015-05-10 ENCOUNTER — Ambulatory Visit (INDEPENDENT_AMBULATORY_CARE_PROVIDER_SITE_OTHER)
Admission: RE | Admit: 2015-05-10 | Discharge: 2015-05-10 | Disposition: A | Discharge status: Home / Self Care | Payer: Managed Care, Other (non HMO) | Source: Ambulatory Visit | Attending: Primary Care | Admitting: Primary Care

## 2015-05-10 ENCOUNTER — Encounter: Payer: Self-pay | Admitting: Primary Care

## 2015-05-10 ENCOUNTER — Ambulatory Visit
Admission: RE | Admit: 2015-05-10 | Discharge: 2015-05-10 | Disposition: A | Discharge status: Home / Self Care | Payer: Managed Care, Other (non HMO) | Source: Ambulatory Visit | Attending: Primary Care | Admitting: Primary Care

## 2015-05-10 ENCOUNTER — Telehealth: Payer: Self-pay

## 2015-05-10 ENCOUNTER — Other Ambulatory Visit: Payer: Self-pay | Admitting: Primary Care

## 2015-05-10 ENCOUNTER — Ambulatory Visit (INDEPENDENT_AMBULATORY_CARE_PROVIDER_SITE_OTHER): Payer: Managed Care, Other (non HMO) | Admitting: Primary Care

## 2015-05-10 VITALS — BP 132/82 | HR 66 | Temp 98.0°F | Ht 69.0 in | Wt 282.8 lb

## 2015-05-10 DIAGNOSIS — R071 Chest pain on breathing: Secondary | ICD-10-CM

## 2015-05-10 DIAGNOSIS — I2699 Other pulmonary embolism without acute cor pulmonale: Secondary | ICD-10-CM | POA: Diagnosis not present

## 2015-05-10 DIAGNOSIS — N62 Hypertrophy of breast: Secondary | ICD-10-CM | POA: Diagnosis not present

## 2015-05-10 DIAGNOSIS — M47814 Spondylosis without myelopathy or radiculopathy, thoracic region: Secondary | ICD-10-CM | POA: Diagnosis not present

## 2015-05-10 DIAGNOSIS — R0781 Pleurodynia: Secondary | ICD-10-CM | POA: Insufficient documentation

## 2015-05-10 DIAGNOSIS — I517 Cardiomegaly: Secondary | ICD-10-CM | POA: Diagnosis not present

## 2015-05-10 LAB — D-DIMER, QUANTITATIVE (NOT AT ARMC): D-Dimer, Quant: 1.08 ug/mL-FEU — ABNORMAL HIGH (ref 0.00–0.48)

## 2015-05-10 MED ORDER — IOPAMIDOL (ISOVUE-370) INJECTION 76%
100.0000 mL | Freq: Once | INTRAVENOUS | Status: AC | PRN
  Administered 2015-05-10: 100 mL via INTRAVENOUS

## 2015-05-10 NOTE — Patient Instructions (Signed)
Complete lab work prior to leaving today. I will notify you of your results once received.   Your ECG looks stable.   Please notify myself or your cardiologist if your chest pain becomes worse.   It was a pleasure meeting you!

## 2015-05-10 NOTE — Progress Notes (Signed)
Pre visit review using our clinic review tool, if applicable. No additional management support is needed unless otherwise documented below in the visit note. 

## 2015-05-10 NOTE — Telephone Encounter (Signed)
Anda Kraft said to advise pt that Anda Kraft has read report and pt can go home and Allie Bossier NP will be in contact with pt. Olivia Mackie voiced understanding and will convey message to pt.

## 2015-05-10 NOTE — Telephone Encounter (Signed)
Discussed results with patient. Clots decreasing in size, although not yet dissipated.  Discussed ED precautions for any increased chest pain and/or development of SOB. Will send note to cardiologist as an [place].

## 2015-05-10 NOTE — Telephone Encounter (Signed)
Richard [name] with [facility] Radiology called report CT of Chest; pt waiting. CT report in epic.

## 2015-05-10 NOTE — Telephone Encounter (Signed)
Pt called to find out results of ct scan  cb number is [phone] Thanks

## 2015-05-10 NOTE — Progress Notes (Addendum)
Subjective:    Patient ID: Richard [name], male    DOB: 1960/04/22, 55 y.o.   MRN: [id number]  HPI  Richard [name] is a 55 year old male who presents today with multiple complaints.  1) Headache: His headache is located to the bilateral temporal sides of his head. His headache has been present for the past 1 week and is intermittent. He's taken tylenol with temporary improvement. Denies dizziness, changes in vision. He's not concerned regarding his headache today.   2) Knee Pain: Partial right knee arthroplasty in February 2017, developed DVT after surgery and also diagnosed with bilateral PE in late March 2017 with IVC filter placement. He is managed on Eliquis BID. He is due again for surgery soon.   3) Chest pain: History of Paroxysmal Atrial Fibrillation, LVH, HTN, DVT, PE. Chest pain upon deep inspiration to the right chest for the past 2 weeks. He describes his pain as a pressure, he does have the IVC filer. He was evaluated by Cardiology several weeks ago with normal exam. Denies cough. He endorses a sedentary lifestyle due to his knee pain. Denies shortness of breath. He does endorse feeling shaky.  Review of Systems  Constitutional: Negative for fever.  HENT: Negative for rhinorrhea.   Respiratory: Negative for cough and shortness of breath.   Cardiovascular: Positive for chest pain.  Neurological: Positive for headaches. Negative for dizziness.       Past Medical History  Diagnosis Date  . Obesity   . Hypertensive heart disease   . Chest pain     a. 2015 Abnl MV;  b. 2015 Cath: nl cors.  . Chronic diastolic CHF (congestive heart failure) ([place])     a. 03/2015 Echo: EF 55-65%, poor windows, mildly dil RV.  Marland Kitchen GERD (gastroesophageal reflux disease)   . Arthritis   . Urine incontinence   . Primary localized osteoarthritis of right knee     a. 02/2014 s/p R Partial Knee arthroplasty.  . Primary localized osteoarthritis of left knee     a. 11/2014 s/p L TKA.  Marland Kitchen OSA (obstructive  sleep apnea)     a. uses CPAP nightly  . Stiffness of left knee     a. 12/2014 s/p manipulation under anesthesia.  Marland Kitchen DVT (deep venous thrombosis) ([place])     a. 03/2015 U/S: occlusive DVT w/in the distal aspect of the Left fem vein through the L popliteal vein.  . Bilateral pulmonary embolism ([place])     a. 03/2015 CTA: acute bilat PE.     Social History   Social History  . Marital Status: Married    Spouse Name: N/A  . Number of Children: N/A  . Years of Education: N/A   Occupational History  . tiler    Social History Main Topics  . Smoking status: Never Smoker   . Smokeless tobacco: Never Used  . Alcohol Use: Yes     Comment: occassional  . Drug Use: No  . Sexual Activity: Yes   Other Topics Concern  . Not on file   Social History Narrative    Past Surgical History  Procedure Laterality Date  . Appendectomy    . Orthopedic surgery Left     arthroscopy x3  . Left heart catheterization with coronary angiogram N/A 01/12/2014    Procedure: LEFT HEART CATHETERIZATION WITH CORONARY ANGIOGRAM;  Surgeon: Jettie Booze, MD;  Location: [facility] CATH LAB;  Service: Cardiovascular;  Laterality: N/A;  . Partial knee arthroplasty Right 02/25/2014  Procedure: RIGHT KNEE ARTHROPLASTY CONDYLE AND PLATEAU MEDIAL COMPARTMENT ;  Surgeon: Johnny Bridge, MD;  Location: [place];  Service: Orthopedics;  Laterality: Right;  . Joint replacement    . Total knee arthroplasty  12/06/2014    Procedure: TOTAL KNEE ARTHROPLASTY;  Surgeon: Marchia Bond, MD;  Location: [place];  Service: Orthopedics;;  . Knee closed reduction Left 01/20/2015    Procedure: LEFT KNEE MANIPULATION;  Surgeon: Marchia Bond, MD;  Location: [place];  Service: Orthopedics;  Laterality: Left;  . Peripheral vascular catheterization N/A 03/29/2015    Procedure: IVC Filter Insertion, and possible thrombectomy;  Surgeon: Katha Cabal, MD;  Location: [place] CV LAB;  Service:  Cardiovascular;  Laterality: N/A;    Family History  Problem Relation Age of Onset  . Coronary artery disease Mother   . Hyperlipidemia Mother   . Hypertension Mother   . Arthritis Mother   . Coronary artery disease Father   . Heart disease Paternal Grandmother   . Alzheimer's disease Paternal Grandfather     Allergies  Allergen Reactions  . Penicillins Other (See Comments)    Reaction:  Unknown     Current Outpatient Prescriptions on File Prior to Visit  Medication Sig Dispense Refill  . acetaminophen (TYLENOL) 325 MG tablet Take 650 mg by mouth every 6 (six) hours as needed for mild pain or headache.     Marland Kitchen amiodarone (PACERONE) 200 MG tablet Take 1 tablet (200 mg total) by mouth daily. 30 tablet 3  . apixaban (ELIQUIS) 5 MG TABS tablet Take 1 tablet (5 mg total) by mouth 2 (two) times daily. 60 tablet 6  . baclofen (LIORESAL) 10 MG tablet Take 10 mg by mouth 3 (three) times daily as needed for muscle spasms.    Marland Kitchen esomeprazole (NEXIUM) 40 MG capsule Take 40 mg by mouth daily as needed (for acid reflux).     . furosemide (LASIX) 40 MG tablet Take 40 mg by mouth daily as needed for edema.    . metoprolol succinate (TOPROL-XL) 25 MG 24 hr tablet Take 1 tablet (25 mg total) by mouth 2 (two) times daily. Take with or immediately following a meal. 90 tablet 3  . mometasone-formoterol (DULERA) 200-5 MCG/ACT AERO Inhale 2 puffs into the lungs 2 (two) times daily. 1 Inhaler 0  . oxyCODONE-acetaminophen (PERCOCET/ROXICET) 5-325 MG tablet Take 1-2 tablets by mouth every 6 (six) hours as needed for severe pain.    Marland Kitchen senna-docusate (SENOKOT-S) 8.6-50 MG tablet Take 2 tablets by mouth 2 (two) times daily.     . traMADol (ULTRAM) 50 MG tablet Take 50 mg by mouth every 12 (twelve) hours as needed for moderate pain.     No current facility-administered medications on file prior to visit.    BP 132/82 mmHg  Pulse 66  Temp(Src) 98 F (36.7 C) (Oral)  Ht 5\' 9"  (1.753 m)  Wt 282 lb 12.8 oz  (128.277 kg)  BMI 41.74 kg/m2  SpO2 98%    Objective:   Physical Exam  Constitutional: He appears well-nourished.  HENT:  Right Ear: External ear normal.  Left Ear: External ear normal.  Nose: Nose normal.  Mouth/Throat: Oropharynx is clear and moist.  Eyes: Pupils are equal, round, and reactive to light.  Neck: Neck supple.  Cardiovascular: Normal rate, regular rhythm and normal heart sounds.   Pulmonary/Chest: Effort normal and breath sounds normal. He has no wheezes. He has no rales.  Musculoskeletal:  Significant decrease in ROM  to left knee.   Neurological: No cranial nerve deficit.  Skin: Skin is warm and dry.          Assessment & Plan:  Headache:  Not bothersome today, although has been problematic x 1 week. Neuro exam unremarkable. Will have him continue tylenol PRN and notify PCP if no improvement.  Chest Pain:  Located to right chest x 2 weeks, only with deep inspiration. History of bilateral PE and DVT 1 month ago. Managed on Eliquis 5 mg BID. Normal exam per Cardiology 2 weeks ago, although he was asymptomatic. ECG completed today and is unchanged from prior 2 weeks ago. Unremarkable exam today. Lungs clear, pain not reproducible.  Could be irritation from IVC filter, but given recent history, will rule out any clotting abnormality.  D-Dimer pending today. Chest xray pending today. Does not appear to be in acute distress. Low suspicion for pericarditis. Will await testing, strict ED/return precautions provided.  Update:  D-Dimer: Positive. Stat CTA ordered due to symptoms and discomfort.  CTA with evidence of bilateral PE, decreased in size, however, not dissipated. Patient continues to experience chest pain upon deep inspiration. Will send note to Cardiology as an FYI. Strict ED precautions provided to patient regarding chest pain and development of shortness of breath. He verbalized understanding.

## 2015-05-12 NOTE — Telephone Encounter (Signed)
Cardiac clearance for left knee manipulation faxed to [facility], [phone]

## 2015-05-16 ENCOUNTER — Telehealth: Payer: Self-pay | Admitting: Internal Medicine

## 2015-05-16 NOTE — Telephone Encounter (Signed)
Pt called. Wants to know if you received a surgical clearance from Dr. Luanna Cole office for his upcoming surgery. Pt states they have faxed it to our office a few time. Please advise as pt states this is urgent.

## 2015-05-16 NOTE — Telephone Encounter (Signed)
Yes, he needs to make an appt for surgical clearance.

## 2015-05-17 NOTE — Telephone Encounter (Signed)
Scheduled 4/28 with Rollene Fare

## 2015-05-17 NOTE — Telephone Encounter (Signed)
Called pt. Phone did not ring. Went straight to say the voicemail box was not set up. Will try again later.

## 2015-05-19 ENCOUNTER — Telehealth: Payer: Self-pay

## 2015-05-19 ENCOUNTER — Ambulatory Visit (INDEPENDENT_AMBULATORY_CARE_PROVIDER_SITE_OTHER)
Admission: RE | Admit: 2015-05-19 | Discharge: 2015-05-19 | Disposition: A | Discharge status: Home / Self Care | Payer: Managed Care, Other (non HMO) | Source: Ambulatory Visit | Attending: Internal Medicine | Admitting: Internal Medicine

## 2015-05-19 ENCOUNTER — Ambulatory Visit (INDEPENDENT_AMBULATORY_CARE_PROVIDER_SITE_OTHER): Payer: Managed Care, Other (non HMO) | Admitting: Internal Medicine

## 2015-05-19 VITALS — BP 120/74 | HR 71 | Temp 98.1°F

## 2015-05-19 DIAGNOSIS — W19XXXA Unspecified fall, initial encounter: Secondary | ICD-10-CM

## 2015-05-19 DIAGNOSIS — M25512 Pain in left shoulder: Secondary | ICD-10-CM

## 2015-05-19 DIAGNOSIS — Y92009 Unspecified place in unspecified non-institutional (private) residence as the place of occurrence of the external cause: Secondary | ICD-10-CM

## 2015-05-19 DIAGNOSIS — Z01810 Encounter for preprocedural cardiovascular examination: Secondary | ICD-10-CM

## 2015-05-19 DIAGNOSIS — Y92099 Unspecified place in other non-institutional residence as the place of occurrence of the external cause: Secondary | ICD-10-CM

## 2015-05-19 NOTE — Progress Notes (Signed)
Pre visit review using our clinic review tool, if applicable. No additional management support is needed unless otherwise documented below in the visit note. 

## 2015-05-19 NOTE — Progress Notes (Signed)
Subjective:    Patient ID: Richard [name], male    DOB: [birth year], 55 y.o.   MRN: [id number]  HPI  Pt presents to the clinic today for clearance for surgery. He is planning manipulation of his left knee under anesthesia. He had a left knee replacement in 11/2014. Since then, he has had 1 manipulation under anesthesia. His recovery has been complicated by DVT and bilateral PE's. Most recent ECG and CT reviewed and showed stable cardiac rhythm and improving PE's. He reports continued chest pain with deep inspiration occasionally. He denies chest pressure or shortness of breath. He does not feel like this pain is related to reflux.  Additionally, he reports a fell yesterday while breaking up a fight between his dogs. He landed on his left shoulder and is reporting left shoulder pain and difficulty lifting his arm up. He denies numbness or tingling of the left arm. He denies weakness of the arm. He has taken Tramadol with minimal relief.  Review of Systems      Past Medical History  Diagnosis Date  . Obesity   . Hypertensive heart disease   . Chest pain     a. 2015 Abnl MV;  b. 2015 Cath: nl cors.  . Chronic diastolic CHF (congestive heart failure) ([place])     a. 03/2015 Echo: EF 55-65%, poor windows, mildly dil RV.  Marland Kitchen GERD (gastroesophageal reflux disease)   . Arthritis   . Urine incontinence   . Primary localized osteoarthritis of right knee     a. 02/2014 s/p R Partial Knee arthroplasty.  . Primary localized osteoarthritis of left knee     a. 11/2014 s/p L TKA.  Marland Kitchen OSA (obstructive sleep apnea)     a. uses CPAP nightly  . Stiffness of left knee     a. 12/2014 s/p manipulation under anesthesia.  Marland Kitchen DVT (deep venous thrombosis) ([place])     a. 03/2015 U/S: occlusive DVT w/in the distal aspect of the Left fem vein through the L popliteal vein.  . Bilateral pulmonary embolism ([place])     a. 03/2015 CTA: acute bilat PE.  Marland Kitchen Hypertension     Current Outpatient Prescriptions  Medication Sig  Dispense Refill  . acetaminophen (TYLENOL) 325 MG tablet Take 650 mg by mouth every 6 (six) hours as needed for mild pain or headache.     Marland Kitchen amiodarone (PACERONE) 200 MG tablet Take 1 tablet (200 mg total) by mouth daily. 30 tablet 3  . apixaban (ELIQUIS) 5 MG TABS tablet Take 1 tablet (5 mg total) by mouth 2 (two) times daily. 60 tablet 6  . baclofen (LIORESAL) 10 MG tablet Take 10 mg by mouth 3 (three) times daily as needed for muscle spasms.    Marland Kitchen esomeprazole (NEXIUM) 40 MG capsule Take 40 mg by mouth daily as needed (for acid reflux).     . furosemide (LASIX) 40 MG tablet Take 40 mg by mouth daily as needed for edema.    . metoprolol succinate (TOPROL-XL) 25 MG 24 hr tablet Take 1 tablet (25 mg total) by mouth 2 (two) times daily. Take with or immediately following a meal. 90 tablet 3  . mometasone-formoterol (DULERA) 200-5 MCG/ACT AERO Inhale 2 puffs into the lungs 2 (two) times daily. 1 Inhaler 0  . oxyCODONE-acetaminophen (PERCOCET/ROXICET) 5-325 MG tablet Take 1-2 tablets by mouth every 6 (six) hours as needed for severe pain.    Marland Kitchen senna-docusate (SENOKOT-S) 8.6-50 MG tablet Take 2 tablets by mouth 2 (two) times  daily.     . traMADol (ULTRAM) 50 MG tablet Take 50 mg by mouth every 12 (twelve) hours as needed for moderate pain.     No current facility-administered medications for this visit.    Allergies  Allergen Reactions  . Penicillins Other (See Comments)    Reaction:  Unknown     Family History  Problem Relation Age of Onset  . Coronary artery disease Mother   . Hyperlipidemia Mother   . Hypertension Mother   . Arthritis Mother   . Coronary artery disease Father   . Heart disease Paternal Grandmother   . Alzheimer's disease Paternal Grandfather     Social History   Social History  . Marital Status: Married    Spouse Name: N/A  . Number of Children: N/A  . Years of Education: N/A   Occupational History  . tiler    Social History Main Topics  . Smoking status:  Never Smoker   . Smokeless tobacco: Never Used  . Alcohol Use: Yes     Comment: occassional  . Drug Use: No  . Sexual Activity: Yes   Other Topics Concern  . Not on file   Social History Narrative     Respiratory: Denies difficulty breathing, shortness of breath, cough or sputum production.   Cardiovascular: Occasional chest pain with deep inspiration. Denies chest tightness, palpitations or swelling in the hands or feet.  Musculoskeletal: Left shoulder pain with decreased ROM. Denies swelling numbness or tingling.   No other specific complaints in a complete review of systems (except as listed in HPI above).  Objective:   Physical Exam  BP 120/74 mmHg  Pulse 71  Temp(Src) 98.1 F (36.7 C) (Oral)  Wt   SpO2 98% Wt Readings from Last 3 Encounters:  05/10/15 282 lb 12.8 oz (128.277 kg)  04/28/15 283 lb 4 oz (128.481 kg)  04/11/15 283 lb (128.368 kg)    General: Appears his stated age, obese and in NAD. Skin: Warm, dry and intact. Surgical incision healed over left knee. Cardiovascular: Normal rate and rhythm. S1,S2 noted. No a fib noted at this time. No murmur, rubs or gallops noted. Trace LLE edema.  Pulmonary/Chest: Normal effort and positive vesicular breath sounds. No respiratory distress. No wheezes, rales or ronchi noted.  Musculoskeletal: Decreased active and passive ROM of the left shoulder. Tender to palpation over the left AC joint. No signs of joint swelling or contusions. No TTP of glenohumeral joint, scapular bony processes, deltoid, or bicips. No pain with pronation or supination of the forearm. Strength 5/5 of biceps, triceps and grip of upper extremity bilaterally. Unable to assess shoulder strength due to pain.  Neurological: Sensation intact to BUE.   BMET    Component Value Date/Time   NA 131* 04/11/2015 0058   K 3.3* 04/11/2015 0058   CL 102 04/11/2015 0058   CO2 20* 04/11/2015 0058   GLUCOSE 104* 04/11/2015 0058   BUN 14 04/11/2015 0058    CREATININE 1.26* 04/11/2015 0058   CALCIUM 8.5* 04/11/2015 0058   GFRNONAA >60 04/11/2015 0058   GFRAA >60 04/11/2015 0058    Lipid Panel     Component Value Date/Time   CHOL 124 03/29/2015 0434   TRIG 71 03/29/2015 0434   HDL 30* 03/29/2015 0434   CHOLHDL 4.1 03/29/2015 0434   VLDL 14 03/29/2015 0434   LDLCALC 80 03/29/2015 0434    CBC    Component Value Date/Time   WBC 11.7* 04/11/2015 0058   RBC 4.85 04/11/2015  0058   HGB 12.6* 04/11/2015 0058   HCT 38.3* 04/11/2015 0058   PLT 353 04/11/2015 0058   MCV 79.0* 04/11/2015 0058   MCH 26.0 04/11/2015 0058   MCHC 32.9 04/11/2015 0058   RDW 15.7* 04/11/2015 0058   LYMPHSABS 2.2 04/07/2015 1549   MONOABS 1.1* 04/07/2015 1549   EOSABS 0.1 04/07/2015 1549   BASOSABS 0.2* 04/07/2015 1549    Hgb A1C Lab Results  Component Value Date   HGBA1C 6.0 03/02/2013         Assessment & Plan:   Clearance for surgery:  No signs of GERD or reflux No signs of new PE or DVT on recent CT ECG stable  No current concerns for surgery Continue all medications  Left shoulder pain s/p fall:  Xray to r/o fracture If xray is normal, consider MRI,for further evaluation Tramadol as needed for pain   Will follow up after xray, RTC as needed

## 2015-05-19 NOTE — Telephone Encounter (Signed)
Pre-op clearance form faxed along with last OV progress notes and EKG--ph--[phone] ext [phone] Sherri

## 2015-05-19 NOTE — Patient Instructions (Signed)
Shoulder Range of Motion Exercises Shoulder range of motion (ROM) exercises are designed to keep the shoulder moving freely. They are often recommended for people who have shoulder pain. MOVEMENT EXERCISE When you are able, do this exercise 5-6 days per week, or as told by your health care provider. Work toward doing 2 sets of 10 swings. Pendulum Exercise How To Do This Exercise Lying Down 1. Lie face-down on a bed with your abdomen close to the side of the bed. 2. Let your arm hang over the side of the bed. 3. Relax your shoulder, arm, and hand. 4. Slowly and gently swing your arm forward and back. Do not use your neck muscles to swing your arm. They should be relaxed. If you are struggling to swing your arm, have someone gently swing it for you. When you do this exercise for the first time, swing your arm at a 15 degree angle for 15 seconds, or swing your arm 10 times. As pain lessens over time, increase the angle of the swing to 30-45 degrees. 5. Repeat steps 1-4 with the other arm. How To Do This Exercise While Standing 1. Stand next to a sturdy chair or table and hold on to it with your hand.  Bend forward at the waist.  Bend your knees slightly.  Relax your other arm and let it hang limp.  Relax the shoulder blade of the arm that is hanging and let it drop.  While keeping your shoulder relaxed, use body motion to swing your arm in small circles. The first time you do this exercise, swing your arm for about 30 seconds or 10 times. When you do it next time, swing your arm for a little longer.  Stand up tall and relax.  Repeat steps 1-7, this time changing the direction of the circles. 2. Repeat steps 1-8 with the other arm. STRETCHING EXERCISES Do these exercises 3-4 times per day on 5-6 days per week or as told by your health care provider. Work toward holding the stretch for 20 seconds. Stretching Exercise 1 1. Lift your arm straight out in front of you. 2. Bend your arm 90  degrees at the elbow (right angle) so your forearm goes across your body and looks like the letter "L." 3. Use your other arm to gently pull the elbow forward and across your body. 4. Repeat steps 1-3 with the other arm. Stretching Exercise 2 You will need a towel or rope for this exercise. 1. Bend one arm behind your back with the palm facing outward. 2. Hold a towel with your other hand. 3. Reach the arm that holds the towel above your head, and bend that arm at the elbow. Your wrist should be behind your neck. 4. Use your free hand to grab the free end of the towel. 5. With the higher hand, gently pull the towel up behind you. 6. With the lower hand, pull the towel down behind you. 7. Repeat steps 1-6 with the other arm. STRENGTHENING EXERCISES Do each of these exercises at four different times of day (sessions) every day or as told by your health care provider. To begin with, repeat each exercise 5 times (repetitions). Work toward doing 3 sets of 12 repetitions or as told by your health care provider. Strengthening Exercise 1 You will need a light weight for this activity. As you grow stronger, you may use a heavier weight. 1. Standing with a weight in your hand, lift your arm straight out to the side   until it is at the same height as your shoulder. 2. Bend your arm at 90 degrees so that your fingers are pointing to the ceiling. 3. Slowly raise your hand until your arm is straight up in the air. 4. Repeat steps 1-3 with the other arm. Strengthening Exercise 2 You will need a light weight for this activity. As you grow stronger, you may use a heavier weight. 1. Standing with a weight in your hand, gradually move your straight arm in an arc, starting at your side, then out in front of you, then straight up over your head. 2. Gradually move your other arm in an arc, starting at your side, then out in front of you, then straight up over your head. 3. Repeat steps 1-2 with the other  arm. Strengthening Exercise 3 You will need an elastic band for this activity. As you grow stronger, gradually increase the size of the bands or increase the number of bands that you use at one time. 1. While standing, hold an elastic band in one hand and raise that arm up in the air. 2. With your other hand, pull down the band until that hand is by your side. 3. Repeat steps 1-2 with the other arm.   This information is not intended to replace advice given to you by your health care provider. Make sure you discuss any questions you have with your health care provider.   Document Released: 10/06/2002 Document Revised: 05/24/2014 Document Reviewed: 01/03/2014 Elsevier Interactive Patient Education 2016 [employer]  

## 2015-05-20 ENCOUNTER — Encounter: Payer: Self-pay | Admitting: Internal Medicine

## 2015-05-22 ENCOUNTER — Telehealth: Payer: Self-pay | Admitting: Internal Medicine

## 2015-05-22 NOTE — Telephone Encounter (Signed)
Please call patient back about x-ray results.  Patient said Rollene Fare was going to talk to Dr.Landau about his knee and he wants to know if Rollene Fare spoke to Dr.Landau.

## 2015-05-22 NOTE — Telephone Encounter (Signed)
Called pt, no answer and VM not set up yet will try again later---please refer to results note

## 2015-05-22 NOTE — Telephone Encounter (Signed)
Mel, you should have a result note about his shoulder xray. Let him know I have not been able to get in touch with Dr. Mardelle Matte.

## 2015-05-23 ENCOUNTER — Other Ambulatory Visit: Payer: Self-pay | Admitting: Orthopedic Surgery

## 2015-05-29 ENCOUNTER — Encounter (HOSPITAL_COMMUNITY): Payer: Self-pay | Admitting: *Deleted

## 2015-05-29 NOTE — Progress Notes (Signed)
Mr Richard [name] reports that he continues to have right sided chest pain when he takes a deep breath, "feels like  A pulled muscle", after deep breath dull ache at times. Patient denies lightheadedness    Medical MD and cardiologist are aware.  I instructed patient to bring CPAP mask in.

## 2015-05-29 NOTE — Progress Notes (Signed)
Anesthesia Chart Review: SAME DAY WORK-UP.  Patient is a 55 year old male scheduled for right knee manipulation under anesthesia on 05/30/15 by Dr. Mardelle Matte.  History includes HTN, chronic diastolic CHF, normal coronaries '15, OSA with CPAP, GERD, non-smoker, s/p right knee arthroplasty medial compartment 02/25/14, s/p left TKA 12/06/14, left knee manipulation 01/20/15. On 03/27/15 he was presented with right sided chest pain and was diagnosed with occlusive left femoral-popliteal DVT and bilateral PE. He was also slightly hypoxia and required oxygen. He was admitted to [facility] on IV heparin. His symptoms progressed with requirement of BiPAP and transfer to ICU. He also developed afib with RVR. Pulmonology, vascular surgery, and cardiology consulted. On 03/29/15, he underwent TPA and insertion of IVC filter [facility] type [facility], Dr. Sharyl Nimrod). He was discharge on 04/03/15 on Eliquis and amiodarone. BMI is consistent with morbid obesity.  PCP is Webb Silversmith, NP who signed a note of medical and cardiac clearance.   Cardiologist was Dr. Irish Lack in 2015, but most recently has seen Dr. Fletcher Anon. In his 04/30/15 note, he hopes to discontinue amiodarone in a few months and monitor his symptoms (for recurrent afib). ASA discontinued to decease bleeding risk. Given the burden of his PE, he is considering lifelong anticoagulation in this patient. Dr. Fletcher Anon also signed a cardiac clearance note, but stated, "Eliquis can not be held due to massive pulmonary embolism on 03/2015."    Medications include amiodarone, Eliquis, baclofen, Nexium, Lasix, Toprol XL, tramadol. He is continuing Eliquis.  05/10/15 EKG: SR, incomplete right BBB, LAFB.   03/27/15 Echo (in the setting of acute bilateral PE): Study Conclusions - Procedure narrative: Transthoracic echocardiography. Image  quality was suboptimal. The study was technically difficult, as a  result of poor acoustic windows, poor sound wave transmission,  and body habitus. -  Left ventricle: Systolic function was normal. The estimated  ejection fraction was in the range of 55% to 65%. - Aortic valve: Valve area (Vmax): 3.3 cm^2. - Right ventricle: The cavity size was mildly dilated.  01/12/14 Cardiac cath (for abnormal stress test 01/07/14): 1. Normal left main coronary artery. 2. Widely patent left anterior descending artery and its branches. 3. Widely patent left circumflex artery and its branches. 4. Widely patent right coronary artery. 5. Normal left ventricular systolic function. LVEDP 20 mmHg. Ejection fraction 60 %.  05/10/15 CXR: FINDINGS: The heart size and mediastinal contours are within normal limits. Both lungs are clear. No pneumothorax or pleural effusion is noted. The visualized skeletal structures are unremarkable. IMPRESSION: No active cardiopulmonary disease.  04/30/15 Chest CTA: IMPRESSION: 1. Reduced size/volume of pulmonary embolus in the right lower lobe and right middle lobe pulmonary arterial tree, although not resolved. 2. Left lower lobe embolus improved, with only faint web like residua of the prior thrombus. 3. Mild cardiomegaly. 4. Mild mediastinal and hilar adenopathy, improved from prior, nonspecific with regard to neoplastic or reactive etiology. 5. Bilateral airway thickening. Mild mosaic attenuation in the lungs could be from air trapping or chronic embolus. 6. Potential left foraminal stenosis at the T3-4 level due to chronic degenerative facet spurring.  He is for labs on arrival. Further evaluation by his anesthesiologist and surgeon at that time to ensure no acute changes.  George Hugh [facility]/Anesthesiology Phone [phone] 05/29/2015 11:43 AM

## 2015-05-30 ENCOUNTER — Ambulatory Visit (HOSPITAL_COMMUNITY): Payer: Managed Care, Other (non HMO) | Admitting: Vascular Surgery

## 2015-05-30 ENCOUNTER — Encounter (HOSPITAL_COMMUNITY): Payer: Self-pay | Admitting: Certified Registered Nurse Anesthetist

## 2015-05-30 ENCOUNTER — Ambulatory Visit (HOSPITAL_COMMUNITY)
Admission: RE | Admit: 2015-05-30 | Discharge: 2015-05-30 | Disposition: A | Discharge status: Home / Self Care | Payer: Managed Care, Other (non HMO) | Source: Ambulatory Visit | Attending: Orthopedic Surgery | Admitting: Orthopedic Surgery

## 2015-05-30 ENCOUNTER — Encounter (HOSPITAL_COMMUNITY)
Admission: RE | Disposition: A | Discharge status: Home / Self Care | Payer: Self-pay | Source: Ambulatory Visit | Attending: Orthopedic Surgery

## 2015-05-30 DIAGNOSIS — I110 Hypertensive heart disease with heart failure: Secondary | ICD-10-CM | POA: Insufficient documentation

## 2015-05-30 DIAGNOSIS — E669 Obesity, unspecified: Secondary | ICD-10-CM | POA: Diagnosis not present

## 2015-05-30 DIAGNOSIS — I739 Peripheral vascular disease, unspecified: Secondary | ICD-10-CM | POA: Insufficient documentation

## 2015-05-30 DIAGNOSIS — Z9989 Dependence on other enabling machines and devices: Secondary | ICD-10-CM | POA: Diagnosis not present

## 2015-05-30 DIAGNOSIS — M24662 Ankylosis, left knee: Secondary | ICD-10-CM | POA: Insufficient documentation

## 2015-05-30 DIAGNOSIS — Z86711 Personal history of pulmonary embolism: Secondary | ICD-10-CM | POA: Diagnosis not present

## 2015-05-30 DIAGNOSIS — Z96652 Presence of left artificial knee joint: Secondary | ICD-10-CM | POA: Diagnosis not present

## 2015-05-30 DIAGNOSIS — Z86718 Personal history of other venous thrombosis and embolism: Secondary | ICD-10-CM | POA: Diagnosis not present

## 2015-05-30 DIAGNOSIS — I480 Paroxysmal atrial fibrillation: Secondary | ICD-10-CM | POA: Insufficient documentation

## 2015-05-30 DIAGNOSIS — T8482XA Fibrosis due to internal orthopedic prosthetic devices, implants and grafts, initial encounter: Secondary | ICD-10-CM | POA: Diagnosis present

## 2015-05-30 DIAGNOSIS — I5032 Chronic diastolic (congestive) heart failure: Secondary | ICD-10-CM | POA: Diagnosis not present

## 2015-05-30 DIAGNOSIS — Z79899 Other long term (current) drug therapy: Secondary | ICD-10-CM | POA: Diagnosis not present

## 2015-05-30 DIAGNOSIS — Z6841 Body Mass Index (BMI) 40.0 and over, adult: Secondary | ICD-10-CM | POA: Diagnosis not present

## 2015-05-30 DIAGNOSIS — Z7901 Long term (current) use of anticoagulants: Secondary | ICD-10-CM | POA: Diagnosis not present

## 2015-05-30 DIAGNOSIS — G4733 Obstructive sleep apnea (adult) (pediatric): Secondary | ICD-10-CM | POA: Diagnosis not present

## 2015-05-30 DIAGNOSIS — K219 Gastro-esophageal reflux disease without esophagitis: Secondary | ICD-10-CM | POA: Diagnosis not present

## 2015-05-30 DIAGNOSIS — M1990 Unspecified osteoarthritis, unspecified site: Secondary | ICD-10-CM | POA: Insufficient documentation

## 2015-05-30 HISTORY — PX: EXAM UNDER ANESTHESIA WITH MANIPULATION OF KNEE: SHX5816

## 2015-05-30 HISTORY — DX: Fibrosis due to internal orthopedic prosthetic devices, implants and grafts, initial encounter: T84.82XA

## 2015-05-30 LAB — BASIC METABOLIC PANEL
Anion gap: 14 (ref 5–15)
BUN: 18 mg/dL (ref 6–20)
CO2: 25 mmol/L (ref 22–32)
Calcium: 9.6 mg/dL (ref 8.9–10.3)
Chloride: 103 mmol/L (ref 101–111)
Creatinine, Ser: 1.31 mg/dL — ABNORMAL HIGH (ref 0.61–1.24)
GFR calc Af Amer: >60 mL/min (ref >60)
GFR calc non Af Amer: >60 mL/min (ref >60)
Glucose, Bld: 91 mg/dL (ref 65–99)
Potassium: 4.2 mmol/L (ref 3.5–5.1)
Sodium: 142 mmol/L (ref 135–145)

## 2015-05-30 LAB — CBC
HCT: 42.3 % (ref 39.0–52.0)
Hemoglobin: 13.4 g/dL (ref 13.0–17.0)
MCH: 26.4 pg (ref 26.0–34.0)
MCHC: 31.7 g/dL (ref 30.0–36.0)
MCV: 83.3 fL (ref 78.0–100.0)
Platelets: 248 10*3/uL (ref 150–400)
RBC: 5.08 MIL/uL (ref 4.22–5.81)
RDW: 14.3 % (ref 11.5–15.5)
WBC: 8.1 10*3/uL (ref 4.0–10.5)

## 2015-05-30 LAB — PROTIME-INR
INR: 1.59 — ABNORMAL HIGH (ref 0.00–1.49)
Prothrombin Time: 19 seconds — ABNORMAL HIGH (ref 11.6–15.2)

## 2015-05-30 SURGERY — MANIPULATION, JOINT, KNEE, WITH ANESTHESIA
Anesthesia: General | Site: Knee | Laterality: Left

## 2015-05-30 MED ORDER — MIDAZOLAM HCL 2 MG/2ML IJ SOLN
INTRAMUSCULAR | Status: AC
  Administered 2015-05-30: 2 mg
  Filled 2015-05-30: qty 2

## 2015-05-30 MED ORDER — ONDANSETRON HCL 4 MG/2ML IJ SOLN
INTRAMUSCULAR | Status: DC | PRN
  Administered 2015-05-30: 4 mg via INTRAVENOUS

## 2015-05-30 MED ORDER — FENTANYL CITRATE (PF) 100 MCG/2ML IJ SOLN
INTRAMUSCULAR | Status: AC
  Administered 2015-05-30: 50 ug via INTRAVENOUS
  Filled 2015-05-30: qty 2

## 2015-05-30 MED ORDER — LACTATED RINGERS IV SOLN: INTRAVENOUS | Status: DC

## 2015-05-30 MED ORDER — MEPERIDINE HCL 25 MG/ML IJ SOLN: 6.2500 mg | INTRAMUSCULAR | Status: DC | PRN

## 2015-05-30 MED ORDER — DEXAMETHASONE SODIUM PHOSPHATE 10 MG/ML IJ SOLN
INTRAMUSCULAR | Status: DC | PRN
  Administered 2015-05-30: 5 mg via INTRAVENOUS

## 2015-05-30 MED ORDER — BACLOFEN 10 MG PO TABS: 10.0000 mg | ORAL_TABLET | Freq: Three times a day (TID) | ORAL | Status: DC | PRN

## 2015-05-30 MED ORDER — LACTATED RINGERS IV SOLN
INTRAVENOUS | Status: DC
  Administered 2015-05-30: 12:00:00 via INTRAVENOUS

## 2015-05-30 MED ORDER — BUPIVACAINE-EPINEPHRINE (PF) 0.5% -1:200000 IJ SOLN
INTRAMUSCULAR | Status: DC | PRN
  Administered 2015-05-30: 30 mL via PERINEURAL

## 2015-05-30 MED ORDER — MIDAZOLAM HCL 2 MG/2ML IJ SOLN: 2.0000 mg | Freq: Once | INTRAMUSCULAR | Status: DC

## 2015-05-30 MED ORDER — FENTANYL CITRATE (PF) 100 MCG/2ML IJ SOLN
50.0000 ug | Freq: Once | INTRAMUSCULAR | Status: AC
  Administered 2015-05-30: 50 ug via INTRAVENOUS

## 2015-05-30 MED ORDER — PROPOFOL 10 MG/ML IV BOLUS
INTRAVENOUS | Status: DC | PRN
  Administered 2015-05-30: 200 mg via INTRAVENOUS

## 2015-05-30 MED ORDER — ROCURONIUM BROMIDE 50 MG/5ML IV SOLN
INTRAVENOUS | Status: AC
  Filled 2015-05-30: qty 1

## 2015-05-30 MED ORDER — ONDANSETRON HCL 4 MG PO TABS: 4.0000 mg | ORAL_TABLET | Freq: Three times a day (TID) | ORAL | Status: DC | PRN

## 2015-05-30 MED ORDER — ROCURONIUM BROMIDE 50 MG/5ML IV SOLN
INTRAVENOUS | Status: AC
  Filled 2015-05-30: qty 2

## 2015-05-30 MED ORDER — MIDAZOLAM HCL 2 MG/2ML IJ SOLN
INTRAMUSCULAR | Status: AC
  Filled 2015-05-30: qty 2

## 2015-05-30 MED ORDER — LIDOCAINE HCL (CARDIAC) 20 MG/ML IV SOLN
INTRAVENOUS | Status: DC | PRN
  Administered 2015-05-30: 50 mg via INTRAVENOUS

## 2015-05-30 MED ORDER — PROPOFOL 10 MG/ML IV BOLUS
INTRAVENOUS | Status: AC
  Filled 2015-05-30: qty 20

## 2015-05-30 MED ORDER — HYDROMORPHONE HCL 1 MG/ML IJ SOLN
0.2500 mg | INTRAMUSCULAR | Status: DC | PRN
  Administered 2015-05-30: 0.5 mg via INTRAVENOUS

## 2015-05-30 MED ORDER — ONDANSETRON HCL 4 MG/2ML IJ SOLN
INTRAMUSCULAR | Status: AC
  Filled 2015-05-30: qty 2

## 2015-05-30 MED ORDER — HYDROMORPHONE HCL 1 MG/ML IJ SOLN
INTRAMUSCULAR | Status: AC
  Filled 2015-05-30: qty 1

## 2015-05-30 MED ORDER — FENTANYL CITRATE (PF) 250 MCG/5ML IJ SOLN
INTRAMUSCULAR | Status: AC
  Filled 2015-05-30: qty 5

## 2015-05-30 MED ORDER — PROCHLORPERAZINE EDISYLATE 5 MG/ML IJ SOLN: 10.0000 mg | INTRAMUSCULAR | Status: DC | PRN

## 2015-05-30 MED ORDER — FENTANYL CITRATE (PF) 100 MCG/2ML IJ SOLN
INTRAMUSCULAR | Status: DC | PRN
  Administered 2015-05-30 (×3): 50 ug via INTRAVENOUS
  Administered 2015-05-30: 100 ug via INTRAVENOUS

## 2015-05-30 MED ORDER — OXYCODONE-ACETAMINOPHEN 5-325 MG PO TABS: 1.0000 | ORAL_TABLET | Freq: Four times a day (QID) | ORAL | Status: DC | PRN

## 2015-05-30 NOTE — Anesthesia Procedure Notes (Addendum)
Anesthesia Regional Block:  Femoral nerve block  Pre-Anesthetic Checklist: ,, timeout performed, Correct Patient, Correct Site, Correct Laterality, Correct Procedure, Correct Position, site marked, Risks and benefits discussed,  Surgical consent,  Pre-op evaluation,  At surgeon's request and post-op pain management  Laterality: Left  Prep: chloraprep       Needles:  Injection technique: Single-shot  Needle Type: Echogenic Needle     Needle Length: 9cm 9 cm Needle Gauge: 21 and 21 G    Additional Needles:  Procedures: ultrasound guided (picture in chart) Femoral nerve block Narrative:  Start time: 05/30/2015 12:15 PM End time: 05/30/2015 12:20 PM Injection made incrementally with aspirations every 5 mL.  Performed by: Personally  Anesthesiologist: Suella Broad D  Additional Notes: Pt tolerated well. No complications noted.    Procedure Name: LMA Insertion Date/Time: 05/30/2015 12:50 PM Performed by: Salli Quarry Charlena Haub Pre-anesthesia Checklist: Patient identified, Emergency Drugs available, Suction available and Patient being monitored Patient Re-evaluated:Patient Re-evaluated prior to inductionOxygen Delivery Method: Circle System Utilized Preoxygenation: Pre-oxygenation with 100% oxygen Intubation Type: IV induction LMA: LMA inserted LMA Size: 5.0 Number of attempts: 1 Placement Confirmation: positive ETCO2 Tube secured with: Tape Dental Injury: Teeth and Oropharynx as per pre-operative assessment

## 2015-05-30 NOTE — Addendum Note (Signed)
Addendum  created 05/30/15 1648 by Effie Berkshire, MD   Modules edited: Clinical Notes   Clinical Notes:  File: [id number]

## 2015-05-30 NOTE — Discharge Instructions (Signed)
Diet: As you were doing prior to hospitalization   Shower:  OK to shower and get knee wet.  Activity:  Increase activity slowly as tolerated, but follow the weight bearing instructions below.  The rules on driving is that you can not be taking narcotics while you drive, and you must feel in control of the vehicle.    Weight Bearing:   As tolerated.    To prevent constipation: you may use a stool softener such as -  Colace (over the counter) 100 mg by mouth twice a day  Drink plenty of fluids (prune juice may be helpful) and high fiber foods Miralax (over the counter) for constipation as needed.    Itching:  If you experience itching with your medications, try taking only a single pain pill, or even half a pain pill at a time.  You may take up to 10 pain pills per day, and you can also use benadryl over the counter for itching or also to help with sleep.   Precautions:  If you experience chest pain or shortness of breath - call 911 immediately for transfer to the hospital emergency department!!  If you develop a fever greater that 101 F, purulent drainage from wound, increased redness or drainage from wound, or calf pain -- Call the office at [phone]                                                Follow- Up Appointment:  Please call for an appointment to be seen in 2 weeks [facility] - [phone]

## 2015-05-30 NOTE — H&P (Signed)
PREOPERATIVE H&P  Chief Complaint: Left knee arthrofibrosis  HPI: Richard [name] is a 55 y.o. male who presents for preoperative history and physical with a diagnosis of left knee arthrofibrosis after a total knee replacement. Symptoms are rated as moderate to severe, and have been worsening.  This is significantly impairing activities of daily living.  He has elected for surgical management. He had a contralateral partial knee replacement, and early onset to regain motion and function but ultimately was able to overcome, and has had a left total knee replacement done because of osteoarthritis, and has had persistent stiffness and inability to regain motion. He already had one manipulation, which initially restored motion, but then subsequently he has developed stiffness again. We have had challenges because he has been on anticoagulation because of a pulmonary embolus, after his knee replacement, and so managing the intra-articular bleeding has been challenging. Nonetheless he has elected for revision manipulation under anesthesia. He has never demonstrated any evidence for infection.  Past Medical History  Diagnosis Date  . Obesity   . Hypertensive heart disease   . Chest pain     a. 2015 Abnl MV;  b. 2015 Cath: nl cors.  . Chronic diastolic CHF (congestive heart failure) ([place])     a. 03/2015 Echo: EF 55-65%, poor windows, mildly dil RV.  Marland Kitchen GERD (gastroesophageal reflux disease)   . Arthritis   . Urine incontinence   . Primary localized osteoarthritis of right knee     a. 02/2014 s/p R Partial Knee arthroplasty.  . Primary localized osteoarthritis of left knee     a. 11/2014 s/p L TKA.  Marland Kitchen OSA (obstructive sleep apnea)     a. uses CPAP nightly  . Stiffness of left knee     a. 12/2014 s/p manipulation under anesthesia.  Marland Kitchen DVT (deep venous thrombosis) ([place])     a. 03/2015 U/S: occlusive DVT w/in the distal aspect of the Left fem vein through the L popliteal vein.  . Bilateral pulmonary  embolism ([place])     a. 03/2015 CTA: acute bilat PE.  Marland Kitchen Hypertension   . Dysrhythmia     PAF   Past Surgical History  Procedure Laterality Date  . Appendectomy    . Orthopedic surgery Left     arthroscopy x3  . Left heart catheterization with coronary angiogram N/A 01/12/2014    Procedure: LEFT HEART CATHETERIZATION WITH CORONARY ANGIOGRAM;  Surgeon: Jettie Booze, MD;  Location: [facility] CATH LAB;  Service: Cardiovascular;  Laterality: N/A;  . Partial knee arthroplasty Right 02/25/2014    Procedure: RIGHT KNEE ARTHROPLASTY CONDYLE AND PLATEAU MEDIAL COMPARTMENT ;  Surgeon: Johnny Bridge, MD;  Location: [place];  Service: Orthopedics;  Laterality: Right;  . Joint replacement    . Total knee arthroplasty  12/06/2014    Procedure: TOTAL KNEE ARTHROPLASTY;  Surgeon: Marchia Bond, MD;  Location: [place];  Service: Orthopedics;;  . Knee closed reduction Left 01/20/2015    Procedure: LEFT KNEE MANIPULATION;  Surgeon: Marchia Bond, MD;  Location: [place];  Service: Orthopedics;  Laterality: Left;  . Peripheral vascular catheterization N/A 03/29/2015    Procedure: IVC Filter Insertion, and possible thrombectomy;  Surgeon: Katha Cabal, MD;  Location: [place] CV LAB;  Service: Cardiovascular;  Laterality: N/A;  . Ivc filter placement ([facility] hx)  04/13/15   Social History   Social History  . Marital Status: Married    Spouse Name: N/A  . Number of Children: N/A  .  Years of Education: N/A   Occupational History  . tiler    Social History Main Topics  . Smoking status: Never Smoker   . Smokeless tobacco: Never Used  . Alcohol Use: Yes     Comment: occassional  . Drug Use: No  . Sexual Activity: Yes   Other Topics Concern  . None   Social History Narrative   Family History  Problem Relation Age of Onset  . Coronary artery disease Mother   . Hyperlipidemia Mother   . Hypertension Mother   . Arthritis Mother   . Coronary artery disease  Father   . Heart disease Paternal Grandmother   . Alzheimer's disease Paternal Grandfather    Allergies  Allergen Reactions  . Penicillins Other (See Comments)    Has patient had a PCN reaction causing immediate rash, facial/tongue/throat swelling, SOB or lightheadedness with hypotension: Unknown, childhood reaction Has patient had a PCN reaction causing severe rash involving mucus membranes or skin necrosis: No Has patient had a PCN reaction that required hospitalization No Has patient had a PCN reaction occurring within the last 10 years: No If all of the above answers are "NO", then may proceed with Cephalosporin use.    Prior to Admission medications   Medication Sig Start Date End Date Taking? Authorizing Provider  acetaminophen (TYLENOL) 325 MG tablet Take 650 mg by mouth daily as needed for mild pain or headache.    Yes Historical Provider, MD  amiodarone (PACERONE) 200 MG tablet Take 1 tablet (200 mg total) by mouth daily. 04/28/15  Yes Wellington Hampshire, MD  apixaban (ELIQUIS) 5 MG TABS tablet Take 1 tablet (5 mg total) by mouth 2 (two) times daily. 04/30/15  Yes Isaiah Serge, NP  baclofen (LIORESAL) 10 MG tablet Take 10 mg by mouth 3 (three) times daily as needed for muscle spasms.   Yes Historical Provider, MD  esomeprazole (NEXIUM) 40 MG capsule Take 40 mg by mouth daily as needed (for acid reflux).    Yes Historical Provider, MD  furosemide (LASIX) 40 MG tablet Take 40 mg by mouth daily as needed for edema.   Yes Historical Provider, MD  metoprolol succinate (TOPROL-XL) 25 MG 24 hr tablet Take 1 tablet (25 mg total) by mouth 2 (two) times daily. Take with or immediately following a meal. 05/01/15  Yes Minna Merritts, MD  senna-docusate (SENOKOT-S) 8.6-50 MG tablet Take 2 tablets by mouth 2 (two) times daily.    Yes Historical Provider, MD  traMADol (ULTRAM) 50 MG tablet Take 50 mg by mouth every 12 (twelve) hours as needed for moderate pain.   Yes Historical Provider, MD      Positive ROS: All other systems have been reviewed and were otherwise negative with the exception of those mentioned in the HPI and as above.  Physical Exam: General: Alert, no acute distress Cardiovascular: No pedal edema Respiratory: No cyanosis, no use of accessory musculature GI: No organomegaly, abdomen is soft and non-tender Skin: No lesions in the area of chief complaint Neurologic: Sensation intact distally Psychiatric: Patient is competent for consent with normal mood and affect Lymphatic: No axillary or cervical lymphadenopathy  MUSCULOSKELETAL: Left knee range of motion is from 20 to 80. Well-healed surgical wounds with intact alignment. He has a stiff woody feel to the endpoint range of motion.  Assessment: Left knee arthrofibrosis  Plan: Plan for Procedure(s): EXAM UNDER ANESTHESIA WITH MANIPULATION OF LEFT KNEE  The risks benefits and alternatives were discussed with the patient including  but not limited to the risks of nonoperative treatment, versus surgical intervention including infection, bleeding, nerve injury,  blood clots, cardiopulmonary complications, morbidity, mortality, among others, and they were willing to proceed.   Johnny Bridge, MD Cell [phone]   05/30/2015 9:41 AM

## 2015-05-30 NOTE — Transfer of Care (Signed)
Immediate Anesthesia Transfer of Care Note  Patient: Richard [name]  Procedure(s) Performed: Procedure(s): EXAM UNDER ANESTHESIA WITH MANIPULATION OF LEFT KNEE (Left)  Patient Location: PACU  Anesthesia Type:GA combined with regional for post-op pain  Level of Consciousness: awake, alert  and patient cooperative  Airway & Oxygen Therapy: Patient Spontanous Breathing and Patient connected to nasal cannula oxygen  Post-op Assessment: Report given to RN and Post -op Vital signs reviewed and stable  Post vital signs: Reviewed and stable  Last Vitals:  Filed Vitals:   05/30/15 1157 05/30/15 1230  BP:  125/45  Pulse: 50 56  Temp:    Resp: 13 16    Last Pain: There were no vitals filed for this visit.       Complications: No apparent anesthesia complications

## 2015-05-30 NOTE — Anesthesia Preprocedure Evaluation (Addendum)
Anesthesia Evaluation  Patient identified by MRN, date of birth, ID band Patient awake    Reviewed: Allergy & Precautions, NPO status , Patient's Chart, lab work & pertinent test results, reviewed documented beta blocker date and time   Airway Mallampati: I  TM Distance: >3 FB Neck ROM: Full    Dental  (+) Teeth Intact, Dental Advisory Given   Pulmonary sleep apnea , PE    + decreased breath sounds      Cardiovascular hypertension, Pt. on medications and Pt. on home beta blockers + Peripheral Vascular Disease and +CHF  + dysrhythmias Atrial Fibrillation  Rhythm:Regular Rate:Normal     Neuro/Psych negative neurological ROS  negative psych ROS   GI/Hepatic Neg liver ROS, GERD  Medicated,  Endo/Other  negative endocrine ROS  Renal/GU negative Renal ROS  negative genitourinary   Musculoskeletal  (+) Arthritis ,   Abdominal (+) + obese,   Peds  Hematology negative hematology ROS (+)   Anesthesia Other Findings   Reproductive/Obstetrics negative OB ROS                          Lab Results  Component Value Date   WBC 11.7* 04/11/2015   HGB 12.6* 04/11/2015   HCT 38.3* 04/11/2015   MCV 79.0* 04/11/2015   PLT 353 04/11/2015   Lab Results  Component Value Date   CREATININE 1.26* 04/11/2015   BUN 14 04/11/2015   NA 131* 04/11/2015   K 3.3* 04/11/2015   CL 102 04/11/2015   CO2 20* 04/11/2015   Lab Results  Component Value Date   INR 1.13 03/27/2015   INR 1.1* 01/11/2014   INR 1.00 08/15/2011   04/2015 EKG: normal sinus rhythm.  03/2015 Echo Left ventricle: Systolic function was normal. The estimated ejection fraction was in the range of 55% to 65%. - Aortic valve: Valve area (Vmax): 3.3 cm^2. - Right ventricle: The cavity size was mildly dilated.     Anesthesia Physical Anesthesia Plan  ASA: III  Anesthesia Plan: General   Post-op Pain Management: GA combined w/ Regional for  post-op pain   Induction: Intravenous  Airway Management Planned: LMA  Additional Equipment:   Intra-op Plan:   Post-operative Plan: Extubation in OR  Informed Consent: I have reviewed the patients History and Physical, chart, labs and discussed the procedure including the risks, benefits and alternatives for the proposed anesthesia with the patient or authorized representative who has indicated his/her understanding and acceptance.   Dental advisory given  Plan Discussed with: CRNA  Anesthesia Plan Comments:       Anesthesia Quick Evaluation

## 2015-05-30 NOTE — Op Note (Signed)
05/30/2015  1:06 PM  PATIENT:  Tressia Miners    PRE-OPERATIVE DIAGNOSIS:  Left knee arthrofibrosis after total knee replacement  POST-OPERATIVE DIAGNOSIS:  Same  PROCEDURE:  EXAM UNDER ANESTHESIA WITH MANIPULATION OF LEFT KNEE  SURGEON:  Johnny Bridge, MD  ANESTHESIA:   General  PREOPERATIVE INDICATIONS:  JOHNANDREW HUCKER is a  55 y.o. male who had a left total knee replacement performed a couple months ago, and has had persistent stiffness. He had a postoperative pulmonary embolus, and did have a previous manipulation under anesthesia but got sick after the manipulation and was unable to complete his physical therapy. His had difficulty with pain control, and regaining mobility. His wounds have always been dry, no evidence for infection at any point along the postoperative course. He elected for revision manipulation under anesthesia.  The risks benefits and alternatives were discussed with the patient preoperatively including but not limited to the risks of infection, bleeding, nerve injury, cardiopulmonary complications, the need for revision surgery, among others, and the patient was willing to proceed. We also discussed the potential for recurrent stiffness, and potentially the need for open surgery.  OPERATIVE IMPLANTS: None  OPERATIVE FINDINGS: Preoperative range of motion was from about 30 flexion contracture to 80 flexion. Postoperative range of motion was from 0 to [id number] with application of force, with passive drop from 5 to 95.  OPERATIVE PROCEDURE: The patient is brought to the operating room and placed in supine position. Gen. anesthesia was administered. Time out performed. Examination demonstrated significant stiffness of the left knee with range of motion from 30-80. Manipulation was then carried out, and I was able to regain extension releasing some lysis of adhesions, I then also moved into deep flexion. Significant lysis was achieved with audible release. I had  substantially improved motion although he has a woody feel altogether. The patella tracked well. Final pictures were taken and uploaded into his chart. He tolerated this well and there were no complications.

## 2015-05-30 NOTE — Anesthesia Postprocedure Evaluation (Addendum)
Anesthesia Post Note  Patient: JESPER GASSNER  Procedure(s) Performed: Procedure(s) (LRB): EXAM UNDER ANESTHESIA WITH MANIPULATION OF LEFT KNEE (Left)  Patient location during evaluation: PACU Anesthesia Type: General and Regional Level of consciousness: awake and alert Pain management: pain level controlled Vital Signs Assessment: post-procedure vital signs reviewed and stable Respiratory status: spontaneous breathing, nonlabored ventilation, respiratory function stable and patient connected to nasal cannula oxygen Cardiovascular status: blood pressure returned to baseline and stable Postop Assessment: no signs of nausea or vomiting Anesthetic complications: no    Last Vitals:  Filed Vitals:   05/30/15 1400 05/30/15 1459  BP: 121/72 131/82  Pulse: 56 60  Temp:    Resp: 17 20    Last Pain:  Filed Vitals:   05/30/15 1505  PainSc: Asleep                 Effie Berkshire

## 2015-05-30 NOTE — Progress Notes (Signed)
Call to Dr. Mardelle Matte, order corrected & confirmed for operative consent.

## 2015-05-31 ENCOUNTER — Encounter (HOSPITAL_COMMUNITY): Payer: Self-pay | Admitting: Orthopedic Surgery

## 2015-06-09 ENCOUNTER — Ambulatory Visit: Payer: Managed Care, Other (non HMO) | Admitting: Cardiovascular Disease

## 2015-06-12 ENCOUNTER — Other Ambulatory Visit: Payer: Self-pay | Admitting: Internal Medicine

## 2015-06-12 ENCOUNTER — Telehealth: Payer: Self-pay | Admitting: Cardiovascular Disease

## 2015-06-12 ENCOUNTER — Other Ambulatory Visit: Payer: Self-pay | Admitting: *Deleted

## 2015-06-12 MED ORDER — METOPROLOL SUCCINATE ER 25 MG PO TB24: 25.0000 mg | ORAL_TABLET | Freq: Two times a day (BID) | ORAL | Status: DC

## 2015-06-12 MED ORDER — AMIODARONE HCL 200 MG PO TABS: 200.0000 mg | ORAL_TABLET | Freq: Every day | ORAL | Status: DC

## 2015-06-12 NOTE — Telephone Encounter (Signed)
Yes okay to continue. Please verify that he is taking only as needed for increased edema.

## 2015-06-12 NOTE — Telephone Encounter (Signed)
Please advise if okay for pt to take

## 2015-06-12 NOTE — Telephone Encounter (Signed)
Refill sent for metoprolol and amiodarone

## 2015-06-12 NOTE — Telephone Encounter (Signed)
°*  STAT* If patient is at the pharmacy, call can be transferred to refill team.   1. Which medications need to be refilled? (please list name of each medication and dose if known) Metoprolol and Amiodarone   2. Which pharmacy/location (including street and city if local pharmacy) is medication to be sent to? [facility] in [place]  3. Do they need a 30 day or 90 day supply? 90 day

## 2015-06-15 NOTE — Addendum Note (Signed)
Addended by: Lurlean Nanny on: 06/15/2015 05:17 PM   Modules accepted: Orders

## 2015-06-15 NOTE — Telephone Encounter (Signed)
Pt reports he has continued to have edema--pt also wants to know if you will be refilling the Tramadol as he only has a few left---last filled 05/19/15--please advise

## 2015-06-16 MED ORDER — TRAMADOL HCL 50 MG PO TABS: 50.0000 mg | ORAL_TABLET | Freq: Two times a day (BID) | ORAL | Status: DC | PRN

## 2015-06-16 NOTE — Addendum Note (Signed)
Addended by: Jearld Fenton on: 06/16/2015 11:20 AM   Modules accepted: Orders

## 2015-06-16 NOTE — Telephone Encounter (Signed)
Rx called in to pharmacy. 

## 2015-06-16 NOTE — Telephone Encounter (Signed)
Ok to phone in Tramadol 

## 2015-06-22 ENCOUNTER — Ambulatory Visit (INDEPENDENT_AMBULATORY_CARE_PROVIDER_SITE_OTHER): Payer: Managed Care, Other (non HMO) | Admitting: Internal Medicine

## 2015-06-22 ENCOUNTER — Encounter: Payer: Self-pay | Admitting: Internal Medicine

## 2015-06-22 VITALS — BP 130/80 | HR 52 | Temp 98.7°F | Ht 69.0 in | Wt 279.0 lb

## 2015-06-22 DIAGNOSIS — I2609 Other pulmonary embolism with acute cor pulmonale: Secondary | ICD-10-CM | POA: Diagnosis not present

## 2015-06-22 DIAGNOSIS — I10 Essential (primary) hypertension: Secondary | ICD-10-CM | POA: Diagnosis not present

## 2015-06-22 DIAGNOSIS — Z125 Encounter for screening for malignant neoplasm of prostate: Secondary | ICD-10-CM | POA: Diagnosis not present

## 2015-06-22 DIAGNOSIS — Z0001 Encounter for general adult medical examination with abnormal findings: Secondary | ICD-10-CM | POA: Diagnosis not present

## 2015-06-22 DIAGNOSIS — I82492 Acute embolism and thrombosis of other specified deep vein of left lower extremity: Secondary | ICD-10-CM | POA: Diagnosis not present

## 2015-06-22 DIAGNOSIS — I480 Paroxysmal atrial fibrillation: Secondary | ICD-10-CM

## 2015-06-22 DIAGNOSIS — I82452 Acute embolism and thrombosis of left peroneal vein: Secondary | ICD-10-CM

## 2015-06-22 DIAGNOSIS — K219 Gastro-esophageal reflux disease without esophagitis: Secondary | ICD-10-CM

## 2015-06-22 DIAGNOSIS — M1990 Unspecified osteoarthritis, unspecified site: Secondary | ICD-10-CM

## 2015-06-22 DIAGNOSIS — G4733 Obstructive sleep apnea (adult) (pediatric): Secondary | ICD-10-CM

## 2015-06-22 LAB — COMPREHENSIVE METABOLIC PANEL
ALT: 16 U/L (ref 0–53)
AST: 15 U/L (ref 0–37)
Albumin: 3.9 g/dL (ref 3.5–5.2)
Alkaline Phosphatase: 87 U/L (ref 39–117)
BUN: 21 mg/dL (ref 6–23)
CO2: 29 mEq/L (ref 19–32)
Calcium: 9.4 mg/dL (ref 8.4–10.5)
Chloride: 102 mEq/L (ref 96–112)
Creatinine, Ser: 1.23 mg/dL (ref 0.40–1.50)
GFR: 65.09 mL/min (ref >60.00)
Glucose, Bld: 99 mg/dL (ref 70–99)
Potassium: 4.2 mEq/L (ref 3.5–5.1)
Sodium: 139 mEq/L (ref 135–145)
Total Bilirubin: 0.5 mg/dL (ref 0.2–1.2)
Total Protein: 7.1 g/dL (ref 6.0–8.3)

## 2015-06-22 LAB — PSA: PSA: 0.16 ng/mL (ref 0.10–4.00)

## 2015-06-22 LAB — HEMOGLOBIN A1C: Hgb A1c MFr Bld: 5.5 % (ref 4.6–6.5)

## 2015-06-22 NOTE — Assessment & Plan Note (Signed)
S/p bilateral knee replacements He will continue to follow with Dr. Mardelle Matte Continue Tramadol, Oxycodone and Baclofen

## 2015-06-22 NOTE — Assessment & Plan Note (Signed)
Controlled off meds Encouraged him to exercise to lose weight and consume a low salt diet Continue Lasix for edema

## 2015-06-22 NOTE — Assessment & Plan Note (Signed)
Improving He takes Nexium very rarely Advised him, he may want to consider take Tums or Rolaids in place of Nexium prn

## 2015-06-22 NOTE — Patient Instructions (Signed)

## 2015-06-22 NOTE — Progress Notes (Signed)
Subjective:    Patient ID: Richard [name], male    DOB: [birth year], 55 y.o.   MRN: [id number]  HPI  Pt presents to the clinic today for his annual exam. He is also due to follow up chronic conditions.  Flu: never Tetanus: 2011 Colon Screening: never Vision Screening: as needed Dentist: as needed  Diet: He does eat meat. He consumes fruits and veggies a few times per week. He does consume some fried food. He drinks mostly water throughout the day. Exercise. He does have PT 3 days a week, no exercise outside of that.  Arthritis: Mainly in his knees. He has had bilateral knee replacements, but has been having issues with his left knee. He continues to follow with Dr. Mardelle Matte. He is working with PT now, but is not making much progress. He is taking Baclofen, Oxycodone and Tramadol as needed.  GERD: Symptoms controlled off Nexium.  HTN:  His BP today is 130/80. He is currently not on any medications specifically for HTN. He takes Lasix daily for edema in his lower legs.  Morbid Obesity: His weight today is 279 with a a BMI of 41.18. He does not adhere to any specific diet or exercise regimen.  OSA: He wears his CPAP as instructed. He is getting about 7-8 hours of sleep per night. He reports he just got a brand new machine a few weeks ago.  Paroxysmal Afib: He follows with Dr. Philmore Pali. ECG from 04/2015 reviewed. He takes Amiodarone, Eliquis and Toprol as prescribed.  History of RLE DVT and bilateral PE's: s/p right left replacement. He is on Eliquis. He has not noticed and s/s of bleeding. He has a follow up with Dr. Philmore Pali 06/29/15.  Review of Systems      Past Medical History  Diagnosis Date  . Obesity   . Hypertensive heart disease   . Chest pain     a. 2015 Abnl MV;  b. 2015 Cath: nl cors.  . Chronic diastolic CHF (congestive heart failure) ([place])     a. 03/2015 Echo: EF 55-65%, poor windows, mildly dil RV.  Marland Kitchen GERD (gastroesophageal reflux disease)   . Arthritis   . Urine  incontinence   . Primary localized osteoarthritis of right knee     a. 02/2014 s/p R Partial Knee arthroplasty.  . Primary localized osteoarthritis of left knee     a. 11/2014 s/p L TKA.  Marland Kitchen OSA (obstructive sleep apnea)     a. uses CPAP nightly  . Stiffness of left knee     a. 12/2014 s/p manipulation under anesthesia.  Marland Kitchen DVT (deep venous thrombosis) ([place])     a. 03/2015 U/S: occlusive DVT w/in the distal aspect of the Left fem vein through the L popliteal vein.  . Bilateral pulmonary embolism ([place])     a. 03/2015 CTA: acute bilat PE.  Marland Kitchen Hypertension   . Dysrhythmia     PAF  . Arthrofibrosis of total knee arthroplasty ([place]) 05/30/2015    Current Outpatient Prescriptions  Medication Sig Dispense Refill  . amiodarone (PACERONE) 200 MG tablet Take 1 tablet (200 mg total) by mouth daily. 90 tablet 3  . apixaban (ELIQUIS) 5 MG TABS tablet Take 1 tablet (5 mg total) by mouth 2 (two) times daily. 60 tablet 6  . baclofen (LIORESAL) 10 MG tablet Take 1 tablet (10 mg total) by mouth 3 (three) times daily as needed for muscle spasms. 30 each 0  . esomeprazole (NEXIUM) 40 MG capsule Take 40 mg  by mouth daily as needed (for acid reflux).     . furosemide (LASIX) 40 MG tablet Take 40 mg by mouth daily as needed for edema.    . furosemide (LASIX) 40 MG tablet TAKE 1 TABLET (40 MG TOTAL) BY MOUTH DAILY. 30 tablet 1  . metoprolol succinate (TOPROL-XL) 25 MG 24 hr tablet Take 1 tablet (25 mg total) by mouth 2 (two) times daily. Take with or immediately following a meal. 90 tablet 3  . ondansetron (ZOFRAN) 4 MG tablet Take 1 tablet (4 mg total) by mouth every 8 (eight) hours as needed for nausea or vomiting. 30 tablet 0  . oxyCODONE-acetaminophen (ROXICET) 5-325 MG tablet Take 1-2 tablets by mouth every 6 (six) hours as needed for severe pain. 50 tablet 0  . senna-docusate (SENOKOT-S) 8.6-50 MG tablet Take 2 tablets by mouth 2 (two) times daily.     . traMADol (ULTRAM) 50 MG tablet Take 1 tablet (50 mg total)  by mouth every 12 (twelve) hours as needed for moderate pain. 30 tablet 0   No current facility-administered medications for this visit.    Allergies  Allergen Reactions  . Penicillins Other (See Comments)    Has patient had a PCN reaction causing immediate rash, facial/tongue/throat swelling, SOB or lightheadedness with hypotension: Unknown, childhood reaction Has patient had a PCN reaction causing severe rash involving mucus membranes or skin necrosis: No Has patient had a PCN reaction that required hospitalization No Has patient had a PCN reaction occurring within the last 10 years: No If all of the above answers are "NO", then may proceed with Cephalosporin use.     Family History  Problem Relation Age of Onset  . Coronary artery disease Mother   . Hyperlipidemia Mother   . Hypertension Mother   . Arthritis Mother   . Coronary artery disease Father   . Heart disease Paternal Grandmother   . Alzheimer's disease Paternal Grandfather     Social History   Social History  . Marital Status: Married    Spouse Name: N/A  . Number of Children: N/A  . Years of Education: N/A   Occupational History  . tiler    Social History Main Topics  . Smoking status: Never Smoker   . Smokeless tobacco: Never Used  . Alcohol Use: Yes     Comment: occassional  . Drug Use: No  . Sexual Activity: Yes   Other Topics Concern  . Not on file   Social History Narrative     Constitutional: Denies fever, malaise, fatigue, headache or abrupt weight changes.  HEENT: Pt reports blurred vision. Denies eye pain, eye redness, ear pain, ringing in the ears, wax buildup, runny nose, nasal congestion, bloody nose, or sore throat. Respiratory: Denies difficulty breathing, shortness of breath, cough or sputum production.   Cardiovascular: Pt reports chest tightness and swelling in his left leg. Denies chest pain, palpitations or swelling in the hands or feet.  Gastrointestinal: Pt reports small  amount of blood in the toilet paper. Denies abdominal pain, bloating, constipation, diarrhea.  GU: Denies urgency, frequency, pain with urination, burning sensation, blood in urine, odor or discharge. Musculoskeletal: Pt reports left knee pain and swelling. Denies decrease in range of motion, difficulty with gait, muscle pain.  Skin: Denies redness, rashes, lesions or ulcercations.  Neurological: Denies dizziness, difficulty with memory, difficulty with speech or problems with balance and coordination.  Psych: Denies anxiety, depression, SI/HI.  No other specific complaints in a complete review of systems (except  as listed in HPI above).  Objective:   Physical Exam   BP 130/80 mmHg  Pulse 52  Temp(Src) 98.7 F (37.1 C) (Oral)  Ht 5\' 9"  (1.753 m)  Wt 279 lb (126.554 kg)  BMI 41.18 kg/m2  SpO2 97% Wt Readings from Last 3 Encounters:  06/22/15 279 lb (126.554 kg)  05/30/15 279 lb (126.554 kg)  05/10/15 282 lb 12.8 oz (128.277 kg)    General: Appears his stated age, well developed, well nourished in NAD. Skin: Warm, dry and intact.  HEENT: Head: normal shape and size; Eyes: sclera white, no icterus, conjunctiva pink, PERRLA and EOMs intact; Ears: Tm's gray and intact, normal light reflex; Throat/Mouth: Teeth present, mucosa pink and moist, no exudate, lesions or ulcerations noted.  Neck:  Neck supple, trachea midline. No masses, lumps or thyromegaly present.  Cardiovascular: Normal rate and rhythm. Distant S1,S2 noted.  No murmur, rubs or gallops noted. Pitting edema LLE No carotid bruits noted. Pulmonary/Chest: Normal effort and positive vesicular breath sounds. No respiratory distress. No wheezes, rales or ronchi noted.  Abdomen: Soft and nontender. Normal bowel sounds. No distention or masses noted. Liver, spleen and kidneys non palpable. Rectal: Normal rectal tone. Prostate normal in size. Musculoskeletal: Decrease flexion and extension of the left knee. Left knee swelling noted  with joint enlargement. Strength 5/5 BUE, RLE. Strength 4/5 LLE.Marland Kitchen He walks with a crutch. Neurological: Alert and oriented. Cranial nerves II-XII grossly  intact. Coordination normal.  Psychiatric: Mood and affect normal. Behavior is normal. Judgment and thought content normal.    BMET    Component Value Date/Time   NA 142 05/30/2015 1144   K 4.2 05/30/2015 1144   CL 103 05/30/2015 1144   CO2 25 05/30/2015 1144   GLUCOSE 91 05/30/2015 1144   BUN 18 05/30/2015 1144   CREATININE 1.31* 05/30/2015 1144   CALCIUM 9.6 05/30/2015 1144   GFRNONAA >60 05/30/2015 1144   GFRAA >60 05/30/2015 1144    Lipid Panel     Component Value Date/Time   CHOL 124 03/29/2015 0434   TRIG 71 03/29/2015 0434   HDL 30* 03/29/2015 0434   CHOLHDL 4.1 03/29/2015 0434   VLDL 14 03/29/2015 0434   LDLCALC 80 03/29/2015 0434    CBC    Component Value Date/Time   WBC 8.1 05/30/2015 1144   RBC 5.08 05/30/2015 1144   HGB 13.4 05/30/2015 1144   HCT 42.3 05/30/2015 1144   PLT 248 05/30/2015 1144   MCV 83.3 05/30/2015 1144   MCH 26.4 05/30/2015 1144   MCHC 31.7 05/30/2015 1144   RDW 14.3 05/30/2015 1144   LYMPHSABS 2.2 04/07/2015 1549   MONOABS 1.1* 04/07/2015 1549   EOSABS 0.1 04/07/2015 1549   BASOSABS 0.2* 04/07/2015 1549    Hgb A1C Lab Results  Component Value Date   HGBA1C 6.0 03/02/2013        Assessment & Plan:   Preventative Health Maintenance:  He does not take flu shots Tetanus UTD He does not want to make a decision about colon screening today, information given on colonoscopy, IFOB and Cologuard Will check CMET, A1C and PSA today- CBC and Lipid checked within the last 2 months reviewed. Encouraged him to work on diet and exercise (pool aerobics would be a good fit for him given his knee pain)  RTC in 6 months to follow up chronic conditons Encouraged him to see an eye doctor and dentist at least annually.

## 2015-06-22 NOTE — Assessment & Plan Note (Signed)
-   Continue Eliquis 

## 2015-06-22 NOTE — Assessment & Plan Note (Signed)
Encouraged him to work on diet and exercise 

## 2015-06-22 NOTE — Assessment & Plan Note (Signed)
He will continue to wear his CPAP as instructed Discuss the benefits of weight loss

## 2015-06-22 NOTE — Assessment & Plan Note (Signed)
Regular today He will continue to follow with cardiology Continue Amiodarone, Toprol and Eliquis

## 2015-06-29 ENCOUNTER — Encounter: Payer: Self-pay | Admitting: Cardiovascular Disease

## 2015-06-29 ENCOUNTER — Ambulatory Visit (INDEPENDENT_AMBULATORY_CARE_PROVIDER_SITE_OTHER): Payer: Managed Care, Other (non HMO) | Admitting: Cardiovascular Disease

## 2015-06-29 VITALS — BP 128/70 | HR 65 | Ht 69.0 in | Wt 279.2 lb

## 2015-06-29 DIAGNOSIS — I10 Essential (primary) hypertension: Secondary | ICD-10-CM | POA: Diagnosis not present

## 2015-06-29 DIAGNOSIS — R079 Chest pain, unspecified: Secondary | ICD-10-CM

## 2015-06-29 DIAGNOSIS — I480 Paroxysmal atrial fibrillation: Secondary | ICD-10-CM

## 2015-06-29 NOTE — Patient Instructions (Signed)
Medication Instructions:  Your physician has recommended you make the following change in your medication:  STOP taking amiodarone   Labwork: none  Testing/Procedures: none  Follow-Up: Your physician recommends that you schedule a follow-up appointment in: 3 months with Dr. Arida.    Any Other Special Instructions Will Be Listed Below (If Applicable).     If you need a refill on your cardiac medications before your next appointment, please call your pharmacy.   

## 2015-06-29 NOTE — Progress Notes (Signed)
Cardiology Office Note   Date:  06/29/2015   ID:  Richard [name], DOB 1960-02-20, MRN [id number]  PCP:  Webb Silversmith, NP  Cardiologist:   Kathlyn Sacramento, MD   Chief Complaint  Patient presents with  . other    1 month F/ U. Pt C/O CP & shortness of breath. Medications reviewed verbally with patient.       History of Present Illness: Richard [name] is a 55 y.o. male who presents for A follow-up visit regarding atrial fibrillation in the setting of massive pulmonary embolism. The patient has history of normal cardiac catheterization in 2015 after an abnormal stress test, hypertension, obesity, osteoarthritis status post bilateral knee surgeries. The patient underwent right partial knee arthroplasty in February 2016 followed by left TKA in November 2016. His mobility was limited after that and he had the flu in February. In early March, he presented with bilateral pulmonary embolism. Lower extremity ultrasound showed left lower extremity DVT. Echo showed normal LV systolic function with mildly dilated right ventricle. The patient's clinical condition worsened and he developed atrial fibrillation with rapid ventricular response. He was placed on amiodarone and underwent catheter-based intervention for his pulmonary embolism as well as IVC filter placement. His condition gradually improved and he was discharged home on anticoagulation with Eliquis. He had recurrent palpitations on March 21 and went to the emergency room. By the time he arrived to the emergency room he was in normal sinus rhythm. The dose of metoprolol was increased to 25 mg twice daily.  No recurrent atrial fibrillation since then. No shortness of breath. He continues to have pleuritic chest pain but that's gradually resolving.    Past Medical History  Diagnosis Date  . Obesity   . Hypertensive heart disease   . Chest pain     a. 2015 Abnl MV;  b. 2015 Cath: nl cors.  . Chronic diastolic CHF (congestive heart  failure) ([place])     a. 03/2015 Echo: EF 55-65%, poor windows, mildly dil RV.  Marland Kitchen GERD (gastroesophageal reflux disease)   . Arthritis   . Urine incontinence   . Primary localized osteoarthritis of right knee     a. 02/2014 s/p R Partial Knee arthroplasty.  . Primary localized osteoarthritis of left knee     a. 11/2014 s/p L TKA.  Marland Kitchen OSA (obstructive sleep apnea)     a. uses CPAP nightly  . Stiffness of left knee     a. 12/2014 s/p manipulation under anesthesia.  Marland Kitchen DVT (deep venous thrombosis) ([place])     a. 03/2015 U/S: occlusive DVT w/in the distal aspect of the Left fem vein through the L popliteal vein.  . Bilateral pulmonary embolism ([place])     a. 03/2015 CTA: acute bilat PE.  Marland Kitchen Hypertension   . Dysrhythmia     PAF  . Arthrofibrosis of total knee arthroplasty ([place]) 05/30/2015    Past Surgical History  Procedure Laterality Date  . Appendectomy    . Orthopedic surgery Left     arthroscopy x3  . Left heart catheterization with coronary angiogram N/A 01/12/2014    Procedure: LEFT HEART CATHETERIZATION WITH CORONARY ANGIOGRAM;  Surgeon: Jettie Booze, MD;  Location: [facility] CATH LAB;  Service: Cardiovascular;  Laterality: N/A;  . Partial knee arthroplasty Right 02/25/2014    Procedure: RIGHT KNEE ARTHROPLASTY CONDYLE AND PLATEAU MEDIAL COMPARTMENT ;  Surgeon: Johnny Bridge, MD;  Location: [place];  Service: Orthopedics;  Laterality: Right;  . Joint  replacement    . Total knee arthroplasty  12/06/2014    Procedure: TOTAL KNEE ARTHROPLASTY;  Surgeon: Marchia Bond, MD;  Location: [place];  Service: Orthopedics;;  . Knee closed reduction Left 01/20/2015    Procedure: LEFT KNEE MANIPULATION;  Surgeon: Marchia Bond, MD;  Location: [place];  Service: Orthopedics;  Laterality: Left;  . Peripheral vascular catheterization N/A 03/29/2015    Procedure: IVC Filter Insertion, and possible thrombectomy;  Surgeon: Katha Cabal, MD;  Location: [place] CV LAB;   Service: Cardiovascular;  Laterality: N/A;  . Ivc filter placement ([facility] hx)  04/13/15  . Exam under anesthesia with manipulation of knee Left 05/30/2015    Procedure: EXAM UNDER ANESTHESIA WITH MANIPULATION OF LEFT KNEE;  Surgeon: Marchia Bond, MD;  Location: [place];  Service: Orthopedics;  Laterality: Left;     Current Outpatient Prescriptions  Medication Sig Dispense Refill  . amiodarone (PACERONE) 200 MG tablet Take 1 tablet (200 mg total) by mouth daily. 90 tablet 3  . apixaban (ELIQUIS) 5 MG TABS tablet Take 1 tablet (5 mg total) by mouth 2 (two) times daily. 60 tablet 6  . baclofen (LIORESAL) 10 MG tablet Take 1 tablet (10 mg total) by mouth 3 (three) times daily as needed for muscle spasms. 30 each 0  . esomeprazole (NEXIUM) 40 MG capsule Take 40 mg by mouth daily as needed (for acid reflux).     . furosemide (LASIX) 40 MG tablet TAKE 1 TABLET (40 MG TOTAL) BY MOUTH DAILY. 30 tablet 1  . metoprolol succinate (TOPROL-XL) 25 MG 24 hr tablet Take 1 tablet (25 mg total) by mouth 2 (two) times daily. Take with or immediately following a meal. 90 tablet 3  . ondansetron (ZOFRAN) 4 MG tablet Take 1 tablet (4 mg total) by mouth every 8 (eight) hours as needed for nausea or vomiting. 30 tablet 0  . oxyCODONE-acetaminophen (ROXICET) 5-325 MG tablet Take 1-2 tablets by mouth every 6 (six) hours as needed for severe pain. 50 tablet 0  . senna-docusate (SENOKOT-S) 8.6-50 MG tablet Take 2 tablets by mouth 2 (two) times daily.     . traMADol (ULTRAM) 50 MG tablet Take 1 tablet (50 mg total) by mouth every 12 (twelve) hours as needed for moderate pain. 30 tablet 0   No current facility-administered medications for this visit.    Allergies:   Penicillins    Social History:  The patient  reports that he has never smoked. He has never used smokeless tobacco. He reports that he drinks alcohol. He reports that he does not use illicit drugs.   Family History:  The patient's family history includes  Alzheimer's disease in his paternal grandfather; Arthritis in his mother; Coronary artery disease in his father and mother; Heart disease in his paternal grandmother; Hyperlipidemia in his mother; Hypertension in his mother.    ROS:  Please see the history of present illness.   Otherwise, review of systems are positive for none.   All other systems are reviewed and negative.    PHYSICAL EXAM: VS:  BP 128/70 mmHg  Pulse 65  Ht 5\' 9"  (1.753 m)  Wt 279 lb 4 oz (126.667 kg)  BMI 41.22 kg/m2  SpO2 98% , BMI Body mass index is 41.22 kg/(m^2). GEN: Well nourished, well developed, in no acute distress HEENT: normal Neck: no JVD, carotid bruits, or masses Cardiac: RRR; no murmurs, rubs, or gallops,no edema  Respiratory:  clear to auscultation bilaterally, normal work of breathing GI:  soft, nontender, nondistended, + BS MS: no deformity or atrophy Skin: warm and dry, no rash Neuro:  Strength and sensation are intact Psych: euthymic mood, full affect   EKG:  EKG is ordered today. The ekg ordered today demonstrates normal sinus rhythm with left anterior fascicular block.   Recent Labs: 03/29/2015: Magnesium 2.0 03/30/2015: TSH 1.741 05/30/2015: Hemoglobin 13.4; Platelets 248 06/22/2015: ALT 16; BUN 21; Creatinine, Ser 1.23; Potassium 4.2; Sodium 139    Lipid Panel    Component Value Date/Time   CHOL 124 03/29/2015 0434   TRIG 71 03/29/2015 0434   HDL 30* 03/29/2015 0434   CHOLHDL 4.1 03/29/2015 0434   VLDL 14 03/29/2015 0434   LDLCALC 80 03/29/2015 0434      Wt Readings from Last 3 Encounters:  06/29/15 279 lb 4 oz (126.667 kg)  06/22/15 279 lb (126.554 kg)  05/30/15 279 lb (126.554 kg)       ASSESSMENT AND PLAN:  1.  Paroxysmal atrial fibrillation: This was in the setting of massive pulmonary embolism.  I don't want to keep him on long-term amiodarone and thus I discontinued the medication. If he develops atrial fibrillation, I recommend a different antiarrhythmic medication  such as class IC antiarrhythmic drugs.  2. Pulmonary embolism: Due to left lower extremity DVT after left knee replacement. Given the burden of his pulmonary embolism, the patient might require lifelong anticoagulation.     Disposition:   FU with me in 3 months  Signed,  Kathlyn Sacramento, MD  06/29/2015 3:05 PM    [facility]

## 2015-07-17 ENCOUNTER — Other Ambulatory Visit: Payer: Self-pay

## 2015-07-17 MED ORDER — FUROSEMIDE 40 MG PO TABS: ORAL_TABLET | ORAL | Status: DC

## 2015-07-17 NOTE — Telephone Encounter (Signed)
Pt left vm requesting refill baclofen to [facility]. Last refilled # 30 on 05/30/15 by Dr Mardelle Matte. Last seen annual exam on 06/22/15.

## 2015-07-17 NOTE — Telephone Encounter (Signed)
Called patient and was advised that he left a message requesting a refill on his Furosemide and not baclofen. Patient stated that Dr. Mardelle Matte has already refilled Baclofen for him. Refill sent to pharmacy for Furosemide.

## 2015-07-17 NOTE — Telephone Encounter (Signed)
I think Dr. Mardelle Matte is prescribing this, he needs to call his office

## 2015-07-20 ENCOUNTER — Other Ambulatory Visit: Payer: Self-pay | Admitting: Internal Medicine

## 2015-07-21 NOTE — Telephone Encounter (Signed)
Last filled 06/16/2015--please advise

## 2015-07-21 NOTE — Telephone Encounter (Signed)
Rx called in to pharmacy. 

## 2015-07-21 NOTE — Telephone Encounter (Signed)
Ok to phone in Tramadol 

## 2015-08-01 ENCOUNTER — Other Ambulatory Visit: Payer: Self-pay | Admitting: Orthopedic Surgery

## 2015-08-03 NOTE — Pre-Procedure Instructions (Signed)
Richard [name]  08/03/2015     Your procedure is scheduled on : Tuesday August 08, 2015 at 2:00 PM.  Report to [facility] Admitting at 12:00 PM.  Call this number if you have problems the morning of surgery: [phone]    Remember:  Do not eat food or drink liquids after midnight.  Take these medicines the morning of surgery with A SIP OF WATER : Esomeprazole (Nexium), Metoprolol (Toprol XL), Ondansetron (Zofran) if needed, Oxycodone (Percocet) if needed, Tramadol (Ultram) if needed, Eliquis (per cardiologist)   Please follow your physicians instructions regarding Eliquis   Stop taking any vitamins, herbal medications/supplements, NSAIDs, Ibuprofen, Advil, Motrin, Aleve, etc today   Bring CPAP mask the day of your surgery   Do not wear jewelry.  Do not wear lotions, powders, or cologne.    Men may shave face and neck.  Do not bring valuables to the hospital.  [facility] is not responsible for any belongings or valuables.  Contacts, dentures or bridgework may not be worn into surgery.  Leave your suitcase in the car.  After surgery it may be brought to your room.  For patients admitted to the hospital, discharge time will be determined by your treatment team.  Patients discharged the day of surgery will not be allowed to drive home.   Name and phone number of your driver:    Special instructions:  Shower using CHG soap the night before and the morning of your surgery  Please read over the following fact sheets that you were given.

## 2015-08-04 ENCOUNTER — Encounter (HOSPITAL_COMMUNITY): Payer: Self-pay

## 2015-08-04 ENCOUNTER — Encounter (HOSPITAL_COMMUNITY)
Admission: RE | Admit: 2015-08-04 | Discharge: 2015-08-04 | Disposition: A | Discharge status: Home / Self Care | Payer: Managed Care, Other (non HMO) | Source: Ambulatory Visit | Attending: Orthopedic Surgery | Admitting: Orthopedic Surgery

## 2015-08-04 DIAGNOSIS — Z01812 Encounter for preprocedural laboratory examination: Secondary | ICD-10-CM | POA: Insufficient documentation

## 2015-08-04 LAB — BASIC METABOLIC PANEL
Anion gap: 8 (ref 5–15)
BUN: 20 mg/dL (ref 6–20)
CO2: 25 mmol/L (ref 22–32)
Calcium: 9.4 mg/dL (ref 8.9–10.3)
Chloride: 104 mmol/L (ref 101–111)
Creatinine, Ser: 1.16 mg/dL (ref 0.61–1.24)
GFR calc Af Amer: >60 mL/min (ref >60)
GFR calc non Af Amer: >60 mL/min (ref >60)
Glucose, Bld: 78 mg/dL (ref 65–99)
Potassium: 4.2 mmol/L (ref 3.5–5.1)
Sodium: 137 mmol/L (ref 135–145)

## 2015-08-04 LAB — CBC
HCT: 42.2 % (ref 39.0–52.0)
Hemoglobin: 13.1 g/dL (ref 13.0–17.0)
MCH: 25.4 pg — ABNORMAL LOW (ref 26.0–34.0)
MCHC: 31 g/dL (ref 30.0–36.0)
MCV: 81.9 fL (ref 78.0–100.0)
Platelets: 289 10*3/uL (ref 150–400)
RBC: 5.15 MIL/uL (ref 4.22–5.81)
RDW: 14.9 % (ref 11.5–15.5)
WBC: 7.6 10*3/uL (ref 4.0–10.5)

## 2015-08-04 NOTE — Progress Notes (Signed)
PCP is Webb Silversmith, NP  Cardiologist is Kathlyn Sacramento  Patient denied having any acute cardiac or pulmonary issues.   Nurse inquired about Eliquis, and patient stated he was instructed not to stop taking Eliquis, however patient suggest Nurse call Dr. Luanna Cole office to verify. Nurse called Sherri at Dr. Luanna Cole office and she stated that per Dr. Fletcher Anon patient did not need to stop taking his Eliquis and that he was to take Eliquis DOS due to his history of blood clots. Nurse informed patient of this. Patient verbalized understanding.   Will send chart to Anesthesia for review.

## 2015-08-04 NOTE — Progress Notes (Signed)
A11 sent to material management requesting large knee high ted hose, as there we none found in Short Stay.

## 2015-08-07 MED ORDER — DEXTROSE 5 % IV SOLN
3.0000 g | INTRAVENOUS | Status: AC
  Administered 2015-08-08: 3 g via INTRAVENOUS
  Filled 2015-08-07: qty 3000

## 2015-08-07 NOTE — Progress Notes (Signed)
Anesthesia Chart Review: Patient is a 55 year old male scheduled for left knee manipulation with arthroscopic lysis of adhesions and aspiration on 08/08/15 by Dr. Mardelle Matte. DX: left knee arthrofibrosis s/p TKA  History includes HTN, chronic diastolic CHF, normal coronaries '15, OSA with CPAP, GERD, non-smoker, s/p right knee arthroplasty medial compartment 02/25/14, s/p left TKA 12/06/14, left knee manipulation 01/20/15 and 05/30/15. On 03/27/15 he was presented with right sided chest pain and was diagnosed with occlusive left femoral-popliteal DVT and bilateral PE. He was also slightly hypoxia and required oxygen. He was admitted to [facility] on IV heparin. His symptoms progressed with requirement of BiPAP and transfer to ICU. He also developed afib with RVR. Pulmonology, vascular surgery, and cardiology consulted. On 03/29/15, he underwent TPA and insertion of IVC filter Celect type [facility], Dr. Sharyl Nimrod). He was discharge on 04/03/15 on Eliquis and amiodarone. BMI is consistent with morbid obesity. PCP is Webb Silversmith, NP.  Cardiologist is Dr. Fletcher Anon (previously saw Dr. Irish Lack in 2015), last visit 06/29/15. He notes that PAF was in the setting of massive PE. Patient was back in SR, so amiodarone was discontinued with recommendation to start a different antiarrhythmic medication such as class IC antiarrhythmic drugs if afib reoccurred. Given DVT and burden of his PE, he felt patient my require lifelong anticoagulation.   Medications include Eliquis, baclofen, Nexium, Lasix, Toprol XL, tramadol. He is continuing Eliquis (Dr. Fletcher Anon had cleared patient prior to left knee manipulation in May 2017, but did not want him to hold Eliquis since he had a massive PE 03/2015.)  06/29/15 EKG: NSR, pulmonary disease pattern, LAFB.   03/27/15 Echo (in the setting of acute bilateral PE): Study Conclusions - Procedure narrative: Transthoracic echocardiography. Image  quality was suboptimal. The study was technically difficult, as  a  result of poor acoustic windows, poor sound wave transmission,  and body habitus. - Left ventricle: Systolic function was normal. The estimated  ejection fraction was in the range of 55% to 65%. - Aortic valve: Valve area (Vmax): 3.3 cm^2. - Right ventricle: The cavity size was mildly dilated.  01/12/14 Cardiac cath (for abnormal stress test 01/07/14): 1. Normal left main coronary artery. 2. Widely patent left anterior descending artery and its branches. 3. Widely patent left circumflex artery and its branches. 4. Widely patent right coronary artery. 5. Normal left ventricular systolic function. LVEDP 20 mmHg. Ejection fraction 60 %.  05/10/15 CXR: FINDINGS: The heart size and mediastinal contours are within normal limits. Both lungs are clear. No pneumothorax or pleural effusion is noted. The visualized skeletal structures are unremarkable. IMPRESSION: No active cardiopulmonary disease.  04/30/15 Chest CTA: IMPRESSION: 1. Reduced size/volume of pulmonary embolus in the right lower lobe and right middle lobe pulmonary arterial tree, although not resolved. 2. Left lower lobe embolus improved, with only faint web like residua of the prior thrombus. 3. Mild cardiomegaly. 4. Mild mediastinal and hilar adenopathy, improved from prior, nonspecific with regard to neoplastic or reactive etiology. 5. Bilateral airway thickening. Mild mosaic attenuation in the lungs could be from air trapping or chronic embolus. 6. Potential left foraminal stenosis at the T3-4 level due to chronic degenerative facet spurring.  Preoperative labs noted. A1c 5.5 on 06/22/15. PT/INR on arrival since he is on Eliquis (unless deferred by his anesthesiologist since it is being continued).   Further evaluation by his anesthesiologist and surgeon at that time to ensure no acute changes prior to proceeding.    Myra Gianotti, PA-C [facility]/Anesthesiology Phone [phone] 08/07/2015 11:41  AM

## 2015-08-08 ENCOUNTER — Observation Stay (HOSPITAL_COMMUNITY)
Admission: RE | Admit: 2015-08-08 | Discharge: 2015-08-09 | Disposition: A | Discharge status: Home / Self Care | Payer: Managed Care, Other (non HMO) | Source: Ambulatory Visit | Attending: Orthopedic Surgery | Admitting: Orthopedic Surgery

## 2015-08-08 ENCOUNTER — Encounter (HOSPITAL_COMMUNITY): Payer: Self-pay | Admitting: Anesthesiology

## 2015-08-08 ENCOUNTER — Ambulatory Visit (HOSPITAL_COMMUNITY): Payer: Managed Care, Other (non HMO) | Admitting: Vascular Surgery

## 2015-08-08 ENCOUNTER — Encounter (HOSPITAL_COMMUNITY)
Admission: RE | Disposition: A | Discharge status: Home / Self Care | Payer: Self-pay | Source: Ambulatory Visit | Attending: Orthopedic Surgery

## 2015-08-08 ENCOUNTER — Observation Stay (HOSPITAL_COMMUNITY): Payer: Managed Care, Other (non HMO)

## 2015-08-08 DIAGNOSIS — M24662 Ankylosis, left knee: Secondary | ICD-10-CM | POA: Diagnosis not present

## 2015-08-08 DIAGNOSIS — Z880 Allergy status to penicillin: Secondary | ICD-10-CM | POA: Diagnosis not present

## 2015-08-08 DIAGNOSIS — M1990 Unspecified osteoarthritis, unspecified site: Secondary | ICD-10-CM | POA: Diagnosis not present

## 2015-08-08 DIAGNOSIS — I5032 Chronic diastolic (congestive) heart failure: Secondary | ICD-10-CM | POA: Insufficient documentation

## 2015-08-08 DIAGNOSIS — I82459 Acute embolism and thrombosis of unspecified peroneal vein: Secondary | ICD-10-CM | POA: Diagnosis present

## 2015-08-08 DIAGNOSIS — G4733 Obstructive sleep apnea (adult) (pediatric): Secondary | ICD-10-CM | POA: Diagnosis not present

## 2015-08-08 DIAGNOSIS — I110 Hypertensive heart disease with heart failure: Secondary | ICD-10-CM | POA: Insufficient documentation

## 2015-08-08 DIAGNOSIS — Z86718 Personal history of other venous thrombosis and embolism: Secondary | ICD-10-CM | POA: Diagnosis not present

## 2015-08-08 DIAGNOSIS — Z86711 Personal history of pulmonary embolism: Secondary | ICD-10-CM | POA: Diagnosis not present

## 2015-08-08 DIAGNOSIS — I2699 Other pulmonary embolism without acute cor pulmonale: Secondary | ICD-10-CM | POA: Diagnosis present

## 2015-08-08 DIAGNOSIS — Z96652 Presence of left artificial knee joint: Secondary | ICD-10-CM | POA: Diagnosis not present

## 2015-08-08 DIAGNOSIS — Z6841 Body Mass Index (BMI) 40.0 and over, adult: Secondary | ICD-10-CM | POA: Insufficient documentation

## 2015-08-08 DIAGNOSIS — Z7901 Long term (current) use of anticoagulants: Secondary | ICD-10-CM | POA: Diagnosis not present

## 2015-08-08 DIAGNOSIS — K219 Gastro-esophageal reflux disease without esophagitis: Secondary | ICD-10-CM | POA: Diagnosis not present

## 2015-08-08 DIAGNOSIS — T8482XA Fibrosis due to internal orthopedic prosthetic devices, implants and grafts, initial encounter: Secondary | ICD-10-CM | POA: Diagnosis present

## 2015-08-08 HISTORY — PX: KNEE ARTHROSCOPY: SHX127

## 2015-08-08 SURGERY — ARTHROSCOPY, KNEE
Anesthesia: Regional | Site: Knee | Laterality: Left

## 2015-08-08 MED ORDER — FENTANYL CITRATE (PF) 100 MCG/2ML IJ SOLN
50.0000 ug | Freq: Once | INTRAMUSCULAR | Status: AC
  Administered 2015-08-08: 50 ug via INTRAVENOUS

## 2015-08-08 MED ORDER — KETOROLAC TROMETHAMINE 15 MG/ML IJ SOLN
7.5000 mg | Freq: Four times a day (QID) | INTRAMUSCULAR | Status: AC
  Administered 2015-08-08 – 2015-08-09 (×4): 7.5 mg via INTRAVENOUS
  Filled 2015-08-08 (×4): qty 1

## 2015-08-08 MED ORDER — OXYCODONE HCL 5 MG PO TABS
ORAL_TABLET | ORAL | Status: AC
  Filled 2015-08-08: qty 2

## 2015-08-08 MED ORDER — METOPROLOL SUCCINATE ER 25 MG PO TB24
25.0000 mg | ORAL_TABLET | Freq: Two times a day (BID) | ORAL | Status: DC
  Administered 2015-08-08 – 2015-08-09 (×2): 25 mg via ORAL
  Filled 2015-08-08 (×2): qty 1

## 2015-08-08 MED ORDER — METHOCARBAMOL 1000 MG/10ML IJ SOLN
500.0000 mg | Freq: Four times a day (QID) | INTRAMUSCULAR | Status: DC | PRN
  Filled 2015-08-08: qty 5

## 2015-08-08 MED ORDER — LIDOCAINE HCL (CARDIAC) 20 MG/ML IV SOLN
INTRAVENOUS | Status: DC | PRN
  Administered 2015-08-08: 80 mg via INTRAVENOUS

## 2015-08-08 MED ORDER — PROPOFOL 10 MG/ML IV BOLUS
INTRAVENOUS | Status: AC
  Filled 2015-08-08: qty 20

## 2015-08-08 MED ORDER — ALUM & MAG HYDROXIDE-SIMETH 200-200-20 MG/5ML PO SUSP: 30.0000 mL | ORAL | Status: DC | PRN

## 2015-08-08 MED ORDER — ONDANSETRON HCL 4 MG PO TABS: 4.0000 mg | ORAL_TABLET | Freq: Three times a day (TID) | ORAL | Status: DC | PRN

## 2015-08-08 MED ORDER — MIDAZOLAM HCL 2 MG/2ML IJ SOLN
INTRAMUSCULAR | Status: AC
  Filled 2015-08-08: qty 2

## 2015-08-08 MED ORDER — OXYCODONE HCL 5 MG PO TABS
5.0000 mg | ORAL_TABLET | ORAL | Status: DC | PRN
  Administered 2015-08-08 – 2015-08-09 (×2): 10 mg via ORAL
  Filled 2015-08-08 (×2): qty 2

## 2015-08-08 MED ORDER — PENTOXIFYLLINE ER 400 MG PO TBCR
400.0000 mg | EXTENDED_RELEASE_TABLET | Freq: Three times a day (TID) | ORAL | Status: DC
  Administered 2015-08-09 (×2): 400 mg via ORAL
  Filled 2015-08-08 (×3): qty 1

## 2015-08-08 MED ORDER — DEXAMETHASONE SODIUM PHOSPHATE 4 MG/ML IJ SOLN
INTRAMUSCULAR | Status: DC | PRN
  Administered 2015-08-08: 10 mg via INTRAVENOUS

## 2015-08-08 MED ORDER — POLYETHYLENE GLYCOL 3350 17 G PO PACK: 17.0000 g | PACK | Freq: Every day | ORAL | Status: DC | PRN

## 2015-08-08 MED ORDER — BUPIVACAINE HCL 0.5 % IJ SOLN
INTRAMUSCULAR | Status: DC | PRN
  Administered 2015-08-08: 8 mL

## 2015-08-08 MED ORDER — METOCLOPRAMIDE HCL 5 MG PO TABS: 5.0000 mg | ORAL_TABLET | Freq: Three times a day (TID) | ORAL | Status: DC | PRN

## 2015-08-08 MED ORDER — SENNOSIDES-DOCUSATE SODIUM 8.6-50 MG PO TABS
2.0000 | ORAL_TABLET | Freq: Two times a day (BID) | ORAL | Status: DC
  Administered 2015-08-08 – 2015-08-09 (×2): 2 via ORAL
  Filled 2015-08-08 (×2): qty 2

## 2015-08-08 MED ORDER — SENNA 8.6 MG PO TABS: 1.0000 | ORAL_TABLET | Freq: Two times a day (BID) | ORAL | Status: DC

## 2015-08-08 MED ORDER — METHOCARBAMOL 500 MG PO TABS
500.0000 mg | ORAL_TABLET | Freq: Four times a day (QID) | ORAL | Status: DC | PRN
  Administered 2015-08-08 – 2015-08-09 (×3): 500 mg via ORAL
  Filled 2015-08-08 (×4): qty 1

## 2015-08-08 MED ORDER — FENTANYL CITRATE (PF) 100 MCG/2ML IJ SOLN
INTRAMUSCULAR | Status: AC
  Administered 2015-08-08 (×3): 25 ug via INTRAVENOUS
  Administered 2015-08-08: 50 ug via INTRAVENOUS
  Administered 2015-08-08: 25 ug via INTRAVENOUS
  Filled 2015-08-08: qty 2

## 2015-08-08 MED ORDER — ACETAMINOPHEN 325 MG PO TABS: 650.0000 mg | ORAL_TABLET | Freq: Four times a day (QID) | ORAL | Status: DC | PRN

## 2015-08-08 MED ORDER — POTASSIUM CHLORIDE IN NACL 20-0.45 MEQ/L-% IV SOLN
INTRAVENOUS | Status: DC
  Administered 2015-08-08: 20:00:00 via INTRAVENOUS
  Filled 2015-08-08: qty 1000

## 2015-08-08 MED ORDER — KETOROLAC TROMETHAMINE 15 MG/ML IJ SOLN
INTRAMUSCULAR | Status: AC
  Filled 2015-08-08: qty 1

## 2015-08-08 MED ORDER — METHYLPREDNISOLONE 4 MG PO TBPK: ORAL_TABLET | ORAL | Status: DC

## 2015-08-08 MED ORDER — HYDROMORPHONE HCL 1 MG/ML IJ SOLN: 0.5000 mg | INTRAMUSCULAR | Status: DC | PRN

## 2015-08-08 MED ORDER — APIXABAN 5 MG PO TABS
5.0000 mg | ORAL_TABLET | Freq: Two times a day (BID) | ORAL | Status: DC
Start: 2015-08-09 — End: 2015-08-09
  Administered 2015-08-09: 5 mg via ORAL
  Filled 2015-08-08: qty 1

## 2015-08-08 MED ORDER — MAGNESIUM CITRATE PO SOLN: 1.0000 | Freq: Once | ORAL | Status: DC | PRN

## 2015-08-08 MED ORDER — BUPIVACAINE-EPINEPHRINE (PF) 0.5% -1:200000 IJ SOLN
INTRAMUSCULAR | Status: DC | PRN
  Administered 2015-08-08: 30 mL via PERINEURAL

## 2015-08-08 MED ORDER — BISACODYL 10 MG RE SUPP: 10.0000 mg | Freq: Every day | RECTAL | Status: DC | PRN

## 2015-08-08 MED ORDER — ONDANSETRON HCL 4 MG/2ML IJ SOLN
INTRAMUSCULAR | Status: AC
  Filled 2015-08-08: qty 2

## 2015-08-08 MED ORDER — FENTANYL CITRATE (PF) 100 MCG/2ML IJ SOLN
INTRAMUSCULAR | Status: AC
  Filled 2015-08-08: qty 2

## 2015-08-08 MED ORDER — TRAMADOL HCL 50 MG PO TABS: 50.0000 mg | ORAL_TABLET | Freq: Two times a day (BID) | ORAL | Status: DC | PRN

## 2015-08-08 MED ORDER — ACETAMINOPHEN 650 MG RE SUPP: 650.0000 mg | Freq: Four times a day (QID) | RECTAL | Status: DC | PRN

## 2015-08-08 MED ORDER — PENTOXIFYLLINE ER 400 MG PO TBCR: 400.0000 mg | EXTENDED_RELEASE_TABLET | Freq: Three times a day (TID) | ORAL | Status: DC

## 2015-08-08 MED ORDER — PROPOFOL 10 MG/ML IV BOLUS
INTRAVENOUS | Status: DC | PRN
  Administered 2015-08-08: 230 mg via INTRAVENOUS

## 2015-08-08 MED ORDER — TRAMADOL HCL 50 MG PO TABS: ORAL_TABLET | ORAL | Status: DC

## 2015-08-08 MED ORDER — METHOCARBAMOL 500 MG PO TABS
ORAL_TABLET | ORAL | Status: AC
  Administered 2015-08-08: 500 mg
  Filled 2015-08-08: qty 1

## 2015-08-08 MED ORDER — DEXAMETHASONE SODIUM PHOSPHATE 10 MG/ML IJ SOLN
INTRAMUSCULAR | Status: AC
  Filled 2015-08-08: qty 1

## 2015-08-08 MED ORDER — METHYLPREDNISOLONE ACETATE 80 MG/ML IJ SUSP
INTRAMUSCULAR | Status: AC
  Filled 2015-08-08: qty 1

## 2015-08-08 MED ORDER — LACTATED RINGERS IV SOLN
INTRAVENOUS | Status: DC
  Administered 2015-08-08: 13:00:00 via INTRAVENOUS

## 2015-08-08 MED ORDER — MENTHOL 3 MG MT LOZG: 1.0000 | LOZENGE | OROMUCOSAL | Status: DC | PRN

## 2015-08-08 MED ORDER — DIPHENHYDRAMINE HCL 12.5 MG/5ML PO ELIX: 12.5000 mg | ORAL_SOLUTION | ORAL | Status: DC | PRN

## 2015-08-08 MED ORDER — SODIUM CHLORIDE 0.9 % IR SOLN
Status: DC | PRN
  Administered 2015-08-08 (×2): 3000 mL

## 2015-08-08 MED ORDER — DEXAMETHASONE SODIUM PHOSPHATE 10 MG/ML IJ SOLN
10.0000 mg | Freq: Once | INTRAMUSCULAR | Status: AC
  Administered 2015-08-09: 10 mg via INTRAVENOUS
  Filled 2015-08-08: qty 1

## 2015-08-08 MED ORDER — FENTANYL CITRATE (PF) 100 MCG/2ML IJ SOLN
25.0000 ug | INTRAMUSCULAR | Status: DC | PRN
  Administered 2015-08-08: 50 ug via INTRAVENOUS
  Administered 2015-08-08 (×2): 25 ug via INTRAVENOUS
  Administered 2015-08-08: 50 ug via INTRAVENOUS

## 2015-08-08 MED ORDER — DOCUSATE SODIUM 100 MG PO CAPS: 100.0000 mg | ORAL_CAPSULE | Freq: Two times a day (BID) | ORAL | Status: DC

## 2015-08-08 MED ORDER — CEFAZOLIN SODIUM-DEXTROSE 2-4 GM/100ML-% IV SOLN
2.0000 g | Freq: Four times a day (QID) | INTRAVENOUS | Status: AC
  Administered 2015-08-08 – 2015-08-09 (×2): 2 g via INTRAVENOUS
  Filled 2015-08-08 (×2): qty 100

## 2015-08-08 MED ORDER — BUPIVACAINE HCL (PF) 0.5 % IJ SOLN
INTRAMUSCULAR | Status: AC
  Filled 2015-08-08: qty 30

## 2015-08-08 MED ORDER — FUROSEMIDE 40 MG PO TABS
40.0000 mg | ORAL_TABLET | Freq: Every day | ORAL | Status: DC
  Administered 2015-08-09: 40 mg via ORAL
  Filled 2015-08-08: qty 1

## 2015-08-08 MED ORDER — ONDANSETRON HCL 4 MG/2ML IJ SOLN
INTRAMUSCULAR | Status: DC | PRN
  Administered 2015-08-08: 4 mg via INTRAVENOUS

## 2015-08-08 MED ORDER — FENTANYL CITRATE (PF) 250 MCG/5ML IJ SOLN
INTRAMUSCULAR | Status: AC
  Filled 2015-08-08: qty 5

## 2015-08-08 MED ORDER — MEPERIDINE HCL 25 MG/ML IJ SOLN: 6.2500 mg | INTRAMUSCULAR | Status: DC | PRN

## 2015-08-08 MED ORDER — ONDANSETRON HCL 4 MG PO TABS: 4.0000 mg | ORAL_TABLET | Freq: Four times a day (QID) | ORAL | Status: DC | PRN

## 2015-08-08 MED ORDER — MIDAZOLAM HCL 2 MG/2ML IJ SOLN
2.0000 mg | Freq: Once | INTRAMUSCULAR | Status: AC
  Administered 2015-08-08: 2 mg via INTRAVENOUS

## 2015-08-08 MED ORDER — PHENOL 1.4 % MT LIQD: 1.0000 | OROMUCOSAL | Status: DC | PRN

## 2015-08-08 MED ORDER — METOCLOPRAMIDE HCL 5 MG/ML IJ SOLN: 10.0000 mg | Freq: Once | INTRAMUSCULAR | Status: DC | PRN

## 2015-08-08 MED ORDER — LIDOCAINE 2% (20 MG/ML) 5 ML SYRINGE
INTRAMUSCULAR | Status: AC
  Filled 2015-08-08: qty 5

## 2015-08-08 MED ORDER — METOCLOPRAMIDE HCL 5 MG/ML IJ SOLN: 5.0000 mg | Freq: Three times a day (TID) | INTRAMUSCULAR | Status: DC | PRN

## 2015-08-08 MED ORDER — METHYLPREDNISOLONE ACETATE 80 MG/ML IJ SUSP
INTRAMUSCULAR | Status: DC | PRN
  Administered 2015-08-08: 80 mg

## 2015-08-08 MED ORDER — ONDANSETRON HCL 4 MG/2ML IJ SOLN: 4.0000 mg | Freq: Four times a day (QID) | INTRAMUSCULAR | Status: DC | PRN

## 2015-08-08 MED ORDER — PANTOPRAZOLE SODIUM 40 MG PO TBEC
80.0000 mg | DELAYED_RELEASE_TABLET | Freq: Every day | ORAL | Status: DC
  Administered 2015-08-09: 80 mg via ORAL
  Filled 2015-08-08: qty 2

## 2015-08-08 MED ORDER — OXYCODONE-ACETAMINOPHEN 5-325 MG PO TABS: 1.0000 | ORAL_TABLET | Freq: Four times a day (QID) | ORAL | Status: DC | PRN

## 2015-08-08 SURGICAL SUPPLY — 48 items
BANDAGE ELASTIC 6 VELCRO ST LF (GAUZE/BANDAGES/DRESSINGS) ×4 IMPLANT
BANDAGE ESMARK 6X9 LF (GAUZE/BANDAGES/DRESSINGS) IMPLANT
BLADE CUTTER GATOR 3.5 (BLADE) IMPLANT
BLADE GREAT WHITE 4.2 (BLADE) ×2 IMPLANT
BLADE SURG 11 STRL SS (BLADE) IMPLANT
BLADE SURG ROTATE 9660 (MISCELLANEOUS) IMPLANT
BNDG ESMARK 6X9 LF (GAUZE/BANDAGES/DRESSINGS)
COVER SURGICAL LIGHT HANDLE (MISCELLANEOUS) ×2 IMPLANT
CUFF TOURNIQUET SINGLE 34IN LL (TOURNIQUET CUFF) IMPLANT
CUTTER MENISCUS 3.5MM 6/BX (BLADE) IMPLANT
DRAPE ARTHROSCOPY W/POUCH 114 (DRAPES) ×2 IMPLANT
DRAPE U-SHAPE 47X51 STRL (DRAPES) ×2 IMPLANT
DRSG PAD ABDOMINAL 8X10 ST (GAUZE/BANDAGES/DRESSINGS) ×2 IMPLANT
DURAPREP 26ML APPLICATOR (WOUND CARE) ×4 IMPLANT
GAUZE SPONGE 4X4 12PLY STRL (GAUZE/BANDAGES/DRESSINGS) IMPLANT
GAUZE XEROFORM 1X8 LF (GAUZE/BANDAGES/DRESSINGS) ×2 IMPLANT
GLOVE BIO SURGEON STRL SZ7.5 (GLOVE) ×2 IMPLANT
GLOVE BIOGEL PI IND STRL 8 (GLOVE) ×1 IMPLANT
GLOVE BIOGEL PI INDICATOR 8 (GLOVE) ×1
GLOVE BIOGEL PI ORTHO PRO SZ8 (GLOVE) ×1
GLOVE PI ORTHO PRO STRL SZ8 (GLOVE) ×1 IMPLANT
GLOVE SURG ORTHO 8.0 STRL STRW (GLOVE) ×4 IMPLANT
GOWN STRL REUS W/ TWL LRG LVL3 (GOWN DISPOSABLE) ×2 IMPLANT
GOWN STRL REUS W/ TWL XL LVL3 (GOWN DISPOSABLE) ×2 IMPLANT
GOWN STRL REUS W/TWL LRG LVL3 (GOWN DISPOSABLE) ×2
GOWN STRL REUS W/TWL XL LVL3 (GOWN DISPOSABLE) ×2
KIT ROOM TURNOVER OR (KITS) ×2 IMPLANT
MANIFOLD NEPTUNE II (INSTRUMENTS) IMPLANT
NEEDLE 18GX1X1/2 (RX/OR ONLY) (NEEDLE) IMPLANT
NEEDLE 22X1 1/2 (OR ONLY) (NEEDLE) ×2 IMPLANT
NEEDLE SPNL 18GX3.5 QUINCKE PK (NEEDLE) IMPLANT
NS IRRIG 1000ML POUR BTL (IV SOLUTION) IMPLANT
PACK ARTHROSCOPY DSU (CUSTOM PROCEDURE TRAY) ×2 IMPLANT
PAD ARMBOARD 7.5X6 YLW CONV (MISCELLANEOUS) ×4 IMPLANT
PADDING CAST COTTON 6X4 STRL (CAST SUPPLIES) ×2 IMPLANT
SET ARTHROSCOPY TUBING (MISCELLANEOUS) ×1
SET ARTHROSCOPY TUBING LN (MISCELLANEOUS) ×1 IMPLANT
SPONGE GAUZE 4X4 12PLY STER LF (GAUZE/BANDAGES/DRESSINGS) ×2 IMPLANT
SPONGE LAP 4X18 X RAY DECT (DISPOSABLE) ×2 IMPLANT
SUT ETHILON 3 0 PS 1 (SUTURE) ×2 IMPLANT
SUT MNCRL AB 4-0 PS2 18 (SUTURE) ×2 IMPLANT
SYR 20ML ECCENTRIC (SYRINGE) IMPLANT
SYR CONTROL 10ML LL (SYRINGE) IMPLANT
TOWEL OR 17X24 6PK STRL BLUE (TOWEL DISPOSABLE) ×2 IMPLANT
TOWEL OR 17X26 10 PK STRL BLUE (TOWEL DISPOSABLE) ×2 IMPLANT
TUBE CONNECTING 12X1/4 (SUCTIONS) ×2 IMPLANT
WAND HAND CNTRL MULTIVAC 90 (MISCELLANEOUS) IMPLANT
WATER STERILE IRR 1000ML POUR (IV SOLUTION) ×2 IMPLANT

## 2015-08-08 NOTE — Progress Notes (Signed)
Orthopedic Tech Progress Note Patient Details:  Richard [name] 1960/12/15 [id number]  CPM Left Knee CPM Left Knee: On Left Knee Flexion (Degrees): 90 Left Knee Extension (Degrees): 0  Ortho Devices Ortho Device/Splint Location: footsie roll Ortho Device/Splint Interventions: Ordered, Application   Richard [name] 08/08/2015, 5:12 PM

## 2015-08-08 NOTE — Op Note (Addendum)
08/08/2015  4:00 PM  PATIENT:  Richard [name]    PRE-OPERATIVE DIAGNOSIS:  Left knee arthrofibrosis, status post total knee replacement and failed manipulation 2  POST-OPERATIVE DIAGNOSIS:  Same  PROCEDURE:  LEFT KNEE MANIPULATION WITH ARTHROSCOPIC LYSIS OF ADHESIONS AND ASPIRATION  SURGEON:  Johnny Bridge, MD  PHYSICIAN ASSISTANT: Joya Gaskins, OPA-C, present and scrubbed throughout the case, critical for completion in a timely fashion, and for retraction, instrumentation, and closure.  ANESTHESIA:   General  PREOPERATIVE INDICATIONS:  Richard [name] is a  55 y.o. male who had a left total knee replacement performed about 3 months ago. He developed postoperative stiffness with failure to improve with range of motion, underwent manipulation under anesthesia, postoperatively had a DVT and pulmonary embolus, after the manipulation. He failed to regain motion, and then underwent a second manipulation under anesthesia. He presented back to the office with persistent loss of motion, range of motion from about 45-80. Aspiration in the office demonstrated almost no fluid, the little fluid that we did get was sent for culture and sensitivity which never grew out anything. His sedimentation rate and CRP were mildly elevated, although this was difficult to interpret in the face of a DVT/PE.  He elected for surgical management of his arthrofibrosis, as he has been miserable and completely dysfunctional.  The risks benefits and alternatives were discussed with the patient preoperatively including but not limited to the risks of infection, bleeding, nerve injury, cardiopulmonary complications, the need for revision surgery, among others, and the patient was willing to proceed. We also discussed the potential for loosening of the prosthesis, the potential need for revision future surgery, inability to regain motion, among others.  OPERATIVE IMPLANTS: None  OPERATIVE FINDINGS: Preoperative range  of motion was from 45-80 during examination under anesthesia. Manipulation yielded significant lysis of adhesions. There was substantial arthrofibrosis encasing the patella and medial and lateral gutters as well as the suprapatellar pouch and anterior fat pad and within the notch itself. This was extremely thick tissue. There was no fluid, no purulence, no cellulitis, no evidence for infection. There was in fact no fluid that I could aspirate, despite clearly being in the joint with the needle against the metal, there was no fluid to be had. I aspirated both the infrapatellar lateral portal as well as the superolateral portal. Neither of these yielded any fluid. I also placed the arthroscopic trocar, and then placed the syringe on the trocar, inside the joint, and still did not get any fluid for culture sensitivity. I think there was just absolutely no joint space, and no synovial fluid present.  OPERATIVE PROCEDURE: The patient is brought to the operating room and placed in the supine position. Regional block and been given. The left lower extremity was prepped and draped in usual sterile fashion. Time out performed.   The leg was elevated and exsanguinated and a tourniquet was inflated. Total tourniquet time was approximately one hour. I performed a gentle extension manipulation, and digit release significant lysis of adhesions in this process. I used a spinal needle through the inferolateral portal, placing it directly against the femoral prosthesis, and did not yield any fluid. I tried again through the superolateral portal going just above the anterior femoral flange, but still did not yield any fluid.  I made an inferolateral portal with a knife, and introduced the trocar. Initially orientation was extremely difficult, as there was really no joint space to speak of, I was not able to get a medial portal  that was functional, so I made a superomedial portal and came in off of the superomedial aspect of  the patella, cleared out the suprapatellar pouch from the notch and then excavated the anterior notch and fat pad.  I carved my way around to the medial gutter, and then along the lateral gutter as well, switching portals to expose the lateral gutter.  Care was taken to protect quadriceps tendon as well as the patellar tendon, and ultimately I was able to uncover the patella which was completely enclosed within scar tissue. After complete synovectomy was performed superiorly medially laterally and anteriorly, I reexamined the knee and it had much better motion, going from about 5 or 10 shy of full extension up easily to 100 of flexion.  The instruments were removed, the portals closed with nylon, final pictures were taken clinically, and he was awakened and returned to PACU in stable and satisfactory condition with sterile gauze applied to the knee, we also injected some Depo-Medrol, I'll plan to put him on a prednisone pack and also consider using Trental, which may have some application to prevent arthrofibrosis.  I did order a CPM for him, although his insurance company did not authorize it, but we will use one while he is in the hospital.

## 2015-08-08 NOTE — H&P (Signed)
PREOPERATIVE H&P  Chief Complaint: ANKYLOSIS LEFT KNEE  HPI: Richard [name] is a 55 y.o. male who presents for preoperative history and physical with a diagnosis of ANKYLOSIS LEFT KNEE. Symptoms are rated as moderate to severe, and have been worsening.  This is significantly impairing activities of daily living.  He has elected for surgical management.   He had a previous left total knee replacement, and initially did well, but subsequently developed substantial stiffness. He had a manipulation under anesthesia which didn't yield significant lysis of adhesions and restoration of motion, but he was not able to maintain this, and presented back with actually a DVT and pulmonary embolus after the manipulation under anesthesia.  He developed recurrent ankylosis, with a 45 flexion contracture, and only about 70 of motion. We manipulated his knee again, which again yielded range of motion 0-95, however he was not able to maintain his extension and presented back to the office with 45 flexion contracture again.  He is completely miserable, cannot walk, and has been unable to restore function. We have aspirated his knee once, which did not have enough fluid to yield a cell count, but culture was negative. Preoperative lab work was also done with a sedimentation rate and CRP both of which were mildly elevated, difficult to determine the significance in the face of a DVT/PE.  He has never had wound healing issues, and does not appear infected clinically, but has had persistent arthrofibrosis and failure to respond.   Past Medical History  Diagnosis Date  . Obesity   . Hypertensive heart disease   . Chest pain     a. 2015 Abnl MV;  b. 2015 Cath: nl cors.  . Chronic diastolic CHF (congestive heart failure) ([place])     a. 03/2015 Echo: EF 55-65%, poor windows, mildly dil RV.  Marland Kitchen GERD (gastroesophageal reflux disease)   . Arthritis   . Urine incontinence   . Primary localized osteoarthritis of right  knee     a. 02/2014 s/p R Partial Knee arthroplasty.  . Primary localized osteoarthritis of left knee     a. 11/2014 s/p L TKA.  Marland Kitchen Stiffness of left knee     a. 12/2014 s/p manipulation under anesthesia.  Marland Kitchen DVT (deep venous thrombosis) ([place])     a. 03/2015 U/S: occlusive DVT w/in the distal aspect of the Left fem vein through the L popliteal vein.  . Bilateral pulmonary embolism ([place])     a. 03/2015 CTA: acute bilat PE.  Marland Kitchen Hypertension   . Dysrhythmia     PAF  . Arthrofibrosis of total knee arthroplasty ([place]) 05/30/2015  . OSA (obstructive sleep apnea)     uses CPAP nightly   Past Surgical History  Procedure Laterality Date  . Appendectomy    . Orthopedic surgery Left     arthroscopy x3  . Left heart catheterization with coronary angiogram N/A 01/12/2014    Procedure: LEFT HEART CATHETERIZATION WITH CORONARY ANGIOGRAM;  Surgeon: Jettie Booze, MD;  Location: [facility] CATH LAB;  Service: Cardiovascular;  Laterality: N/A;  . Partial knee arthroplasty Right 02/25/2014    Procedure: RIGHT KNEE ARTHROPLASTY CONDYLE AND PLATEAU MEDIAL COMPARTMENT ;  Surgeon: Johnny Bridge, MD;  Location: [place];  Service: Orthopedics;  Laterality: Right;  . Joint replacement    . Total knee arthroplasty  12/06/2014    Procedure: TOTAL KNEE ARTHROPLASTY;  Surgeon: Marchia Bond, MD;  Location: [place];  Service: Orthopedics;;  . Knee closed reduction Left 01/20/2015  Procedure: LEFT KNEE MANIPULATION;  Surgeon: Marchia Bond, MD;  Location: [place];  Service: Orthopedics;  Laterality: Left;  . Peripheral vascular catheterization N/A 03/29/2015    Procedure: IVC Filter Insertion, and possible thrombectomy;  Surgeon: Katha Cabal, MD;  Location: [place] CV LAB;  Service: Cardiovascular;  Laterality: N/A;  . Ivc filter placement ([facility] hx)  04/13/15  . Exam under anesthesia with manipulation of knee Left 05/30/2015    Procedure: EXAM UNDER ANESTHESIA WITH MANIPULATION OF  LEFT KNEE;  Surgeon: Marchia Bond, MD;  Location: [place];  Service: Orthopedics;  Laterality: Left;  . Cardiac catheterization     Social History   Social History  . Marital Status: Married    Spouse Name: N/A  . Number of Children: N/A  . Years of Education: N/A   Occupational History  . tiler    Social History Main Topics  . Smoking status: Never Smoker   . Smokeless tobacco: Never Used  . Alcohol Use: Yes     Comment: occassional  . Drug Use: No  . Sexual Activity: Yes   Other Topics Concern  . None   Social History Narrative   Family History  Problem Relation Age of Onset  . Coronary artery disease Mother   . Hyperlipidemia Mother   . Hypertension Mother   . Arthritis Mother   . Coronary artery disease Father   . Heart disease Paternal Grandmother   . Alzheimer's disease Paternal Grandfather    Allergies  Allergen Reactions  . Penicillins Other (See Comments)    Has patient had a PCN reaction causing immediate rash, facial/tongue/throat swelling, SOB or lightheadedness with hypotension: Unknown, childhood reaction Has patient had a PCN reaction causing severe rash involving mucus membranes or skin necrosis: No Has patient had a PCN reaction that required hospitalization No Has patient had a PCN reaction occurring within the last 10 years: No If all of the above answers are "NO", then may proceed with Cephalosporin use.    Prior to Admission medications   Medication Sig Start Date End Date Taking? Authorizing Provider  apixaban (ELIQUIS) 5 MG TABS tablet Take 1 tablet (5 mg total) by mouth 2 (two) times daily. 04/30/15  Yes Isaiah Serge, NP  baclofen (LIORESAL) 10 MG tablet Take 1 tablet (10 mg total) by mouth 3 (three) times daily as needed for muscle spasms. 05/30/15  Yes Marchia Bond, MD  esomeprazole (NEXIUM) 40 MG capsule Take 40 mg by mouth daily as needed (for acid reflux).    Yes Historical Provider, MD  furosemide (LASIX) 40 MG tablet TAKE 1 TABLET (40  MG TOTAL) BY MOUTH DAILY. 07/17/15  Yes Jearld Fenton, NP  metoprolol succinate (TOPROL-XL) 25 MG 24 hr tablet Take 1 tablet (25 mg total) by mouth 2 (two) times daily. Take with or immediately following a meal. 06/12/15  Yes Minna Merritts, MD  oxyCODONE-acetaminophen (ROXICET) 5-325 MG tablet Take 1-2 tablets by mouth every 6 (six) hours as needed for severe pain. 05/30/15  Yes Marchia Bond, MD  senna-docusate (SENOKOT-S) 8.6-50 MG tablet Take 2 tablets by mouth 2 (two) times daily.    Yes Historical Provider, MD  traMADol (ULTRAM) 50 MG tablet TAKE 1 TABLET BY MOUTH EVERY 12 HOURS AS NEEDED Patient taking differently: TAKE 1 TABLET (50 mg) BY MOUTH EVERY 12 HOURS AS NEEDED for pain 07/21/15  Yes Jearld Fenton, NP  ondansetron (ZOFRAN) 4 MG tablet Take 1 tablet (4 mg total) by mouth every  8 (eight) hours as needed for nausea or vomiting. 05/30/15   Marchia Bond, MD     Positive ROS: All other systems have been reviewed and were otherwise negative with the exception of those mentioned in the HPI and as above.  Physical Exam: General: Alert, no acute distress Cardiovascular: No pedal edema Respiratory: No cyanosis, no use of accessory musculature GI: No organomegaly, abdomen is soft and non-tender Skin: No lesions in the area of chief complaint Neurologic: Sensation intact distally Psychiatric: Patient is competent for consent with normal mood and affect Lymphatic: No axillary or cervical lymphadenopathy  MUSCULOSKELETAL: Left knee range of motion is from 45-90 with normal clinical alignment and significant stiffness with mild warmth and diffuse swelling throughout the left lower extremity.  Assessment: ANKYLOSIS LEFT KNEE, status post total knee replacement, with DVT/PE   Plan: Plan for Procedure(s): ARTHROSCOPY LEFT KNEE WITH LYSIS OF ADHESIONS AND MANIPULATION with intraoperative aspiration  The risks benefits and alternatives were discussed with the patient including but not  limited to the risks of nonoperative treatment, versus surgical intervention including infection, bleeding, nerve injury,  blood clots, cardiopulmonary complications, morbidity, mortality, among others, and they were willing to proceed. We've also discussed the risks that he may not be able to regain function with this leg, and maintaining extension may be significantly challenging. Additionally, there is risk for loosening of the components with the repeated manipulations, as well as risk for introduction for infection given the multiple instrumentations with aspirations of the left knee. This is been extremely difficult problem, that has not been very responsive to both surgical and nonsurgical measures so far, and hopefully we will be able to salvage some degree of a quality of life with his knee.  Johnny Bridge, MD Cell [phone]   08/08/2015 1:31 PM

## 2015-08-08 NOTE — Anesthesia Preprocedure Evaluation (Signed)
Anesthesia Evaluation  Patient identified by MRN, date of birth, ID band Patient awake    Reviewed: Allergy & Precautions, NPO status , Patient's Chart, lab work & pertinent test results, reviewed documented beta blocker date and time   Airway Mallampati: II  TM Distance: >3 FB Neck ROM: Full    Dental  (+) Chipped,    Pulmonary sleep apnea and Continuous Positive Airway Pressure Ventilation ,    Pulmonary exam normal breath sounds clear to auscultation       Cardiovascular hypertension, Pt. on medications and Pt. on home beta blockers + Peripheral Vascular Disease, +CHF and + DVT  + dysrhythmias  Rhythm:Regular Rate:Normal     Neuro/Psych negative neurological ROS  negative psych ROS   GI/Hepatic Neg liver ROS, GERD  Controlled and Medicated,  Endo/Other  Morbid obesity  Renal/GU negative Renal ROS  negative genitourinary   Musculoskeletal  (+) Arthritis , Osteoarthritis,  Arthrofibrosis left knee   Abdominal Normal abdominal exam  (+) + obese,   Peds  Hematology   Anesthesia Other Findings   Reproductive/Obstetrics                             Anesthesia Physical Anesthesia Plan  ASA: III  Anesthesia Plan: General and Regional   Post-op Pain Management: GA combined w/ Regional for post-op pain   Induction: Intravenous and Cricoid pressure planned  Airway Management Planned: Oral ETT  Additional Equipment:   Intra-op Plan:   Post-operative Plan: Extubation in OR  Informed Consent: I have reviewed the patients History and Physical, chart, labs and discussed the procedure including the risks, benefits and alternatives for the proposed anesthesia with the patient or authorized representative who has indicated his/her understanding and acceptance.   Dental advisory given  Plan Discussed with: Anesthesiologist, CRNA and Surgeon  Anesthesia Plan Comments:         Anesthesia  Quick Evaluation

## 2015-08-08 NOTE — Anesthesia Procedure Notes (Addendum)
Anesthesia Regional Block:  Femoral nerve block  Pre-Anesthetic Checklist: ,, timeout performed, Correct Patient, Correct Site, Correct Laterality, Correct Procedure, Correct Position, site marked, Risks and benefits discussed,  Surgical consent,  Pre-op evaluation,  At surgeon's request and post-op pain management  Laterality: Left and Lower  Prep: chloraprep       Needles:  Injection technique: Single-shot  Needle Type: Echogenic Stimulator Needle     Needle Length: 9cm 9 cm Needle Gauge: 21 and 21 G  Needle insertion depth: 6.5 cm   Additional Needles:  Procedures: ultrasound guided (picture in chart) Femoral nerve block  Nerve Stimulator or Paresthesia:  Response: Left Quadriceps twitch, 0.2 mA, 6.5 cm  Additional Responses:   Narrative:  Start time: 08/08/2015 1:06 PM End time: 08/08/2015 1:13 PM Injection made incrementally with aspirations every 5 mL.  Performed by: Personally  Anesthesiologist: Josephine Igo  Additional Notes: Relevant anatomy ID'd with [place]. Quadriceps Twitch at 0.63mA. Incremental 76ml injection with frequent aspiration. Patient tolerated procedure well.    Procedure Name: LMA Insertion Performed by: Terrance Mass Pre-anesthesia Checklist: Patient identified, Emergency Drugs available, Suction available and Patient being monitored Patient Re-evaluated:Patient Re-evaluated prior to inductionOxygen Delivery Method: Circle System Utilized Preoxygenation: Pre-oxygenation with 100% oxygen Intubation Type: IV induction Ventilation: Mask ventilation without difficulty LMA: LMA inserted LMA Size: 5.0 Number of attempts: 1 Placement Confirmation: positive ETCO2 Tube secured with: Tape Dental Injury: Teeth and Oropharynx as per pre-operative assessment

## 2015-08-08 NOTE — Anesthesia Postprocedure Evaluation (Signed)
Anesthesia Post Note  Patient: Richard [name]  Procedure(s) Performed: Procedure(s) (LRB): LEFT KNEE MANIPULATION WITH ARTHROSCOPIC LYSIS OF ADHESIONS AND ASPIRATION (Left)  Patient location during evaluation: PACU Anesthesia Type: General Level of consciousness: awake and alert and patient cooperative Pain management: pain level controlled Vital Signs Assessment: post-procedure vital signs reviewed and stable Respiratory status: spontaneous breathing and respiratory function stable Cardiovascular status: stable Anesthetic complications: no    Last Vitals:  Filed Vitals:   08/08/15 1705 08/08/15 1708  BP:  117/68  Pulse:  52  Temp:    Resp: 20 20    Last Pain:  Filed Vitals:   08/08/15 1709  PainSc: [place]

## 2015-08-08 NOTE — Progress Notes (Signed)
Patient placed on hospital CPAP machine with home CPAP settings with no complications. Nurse aware and at bedside.

## 2015-08-08 NOTE — Transfer of Care (Signed)
Immediate Anesthesia Transfer of Care Note  Patient: Richard [name]  Procedure(s) Performed: Procedure(s): LEFT KNEE MANIPULATION WITH ARTHROSCOPIC LYSIS OF ADHESIONS AND ASPIRATION (Left)  Patient Location: PACU  Anesthesia Type:General and Regional  Level of Consciousness: awake, alert  and oriented  Airway & Oxygen Therapy: Patient Spontanous Breathing and Patient connected to face mask oxygen  Post-op Assessment: Report given to RN and Post -op Vital signs reviewed and stable  Post vital signs: Reviewed and stable  Last Vitals:  Filed Vitals:   08/08/15 1338 08/08/15 1622  BP: 114/54   Pulse:    Temp:  36.4 C  Resp: 13     Last Pain: There were no vitals filed for this visit.       Complications: No apparent anesthesia complications

## 2015-08-09 ENCOUNTER — Encounter (HOSPITAL_COMMUNITY): Payer: Self-pay | Admitting: Orthopedic Surgery

## 2015-08-09 DIAGNOSIS — M24662 Ankylosis, left knee: Secondary | ICD-10-CM | POA: Diagnosis not present

## 2015-08-09 NOTE — Evaluation (Signed)
Physical Therapy Evaluation Patient Details Name: Richard [name] MRN: [id number] DOB: 12/22/1960 Today's Date: 08/09/2015   History of Present Illness  s/p LEFT KNEE MANIPULATION WITH ARTHROSCOPIC LYSIS OF ADHESIONS AND ASPIRATION (Left)  Clinical Impression  Patient evaluated by Physical Therapy with no further acute PT needs identified. All education has been completed and the patient has no further questions. Pt ambulated at mod I level with RW and positioning and exercises were reviewed.  See below for any follow-up Physical Therapy or equipment needs. PT is signing off. Thank you for this referral.     Follow Up Recommendations Outpatient PT    Equipment Recommendations  None recommended by PT    Recommendations for Other Services       Precautions / Restrictions Precautions Precautions: Knee Precaution Booklet Issued: No Precaution Comments: reviewed proper positioning with emphasis on extension Restrictions Weight Bearing Restrictions: No Other Position/Activity Restrictions: WBAT      Mobility  Bed Mobility Overal bed mobility: Needs Assistance Bed Mobility: Supine to Sit     Supine to sit: Min assist     General bed mobility comments: min A for elevation of trunk. Pt reports that his dad comes over every day and helps him OOB if he needs it and helps him exercise his leg  Transfers Overall transfer level: Modified independent Equipment used: Rolling walker (2 wheeled)             General transfer comment: pt familiar with use of RW, stood and sat safely from bed and recliner  Ambulation/Gait Ambulation/Gait assistance: Modified independent (Device/Increase time) Ambulation Distance (Feet): 50 Feet Assistive device: Rolling walker (2 wheeled) Gait Pattern/deviations: Decreased weight shift to left;Decreased stance time - left;Decreased step length - left;Step-through pattern Gait velocity: decreased Gait velocity interpretation: Below normal speed  for age/gender General Gait Details: pt with decreased left heel strike due to left knee maintained in flexion. Pt working on quad contraction during LLE stance phase  Stairs Stairs:  (vrebally reviewed pattern)          Wheelchair Mobility    Modified Rankin (Stroke Patients Only)       Balance Overall balance assessment: Modified Independent                                           Pertinent Vitals/Pain Pain Assessment: Faces Pain Score: 5  Faces Pain Scale: Hurts little more Pain Location: LLE Pain Descriptors / Indicators: Aching Pain Intervention(s): Limited activity within patient's tolerance;Monitored during session;Premedicated before session    Home Living Family/patient expects to be discharged to:: Private residence Living Arrangements: Spouse/significant other;Parent;Children Available Help at Discharge: Family Type of Home: House Home Access: Stairs to enter Entrance Stairs-Rails: Right Entrance Stairs-Number of Steps: 3 Home Layout: One level Home Equipment: Cane - quad;Crutches;Walker - standard;Bedside commode      Prior Function Level of Independence: Independent         Comments: Pt using axillary crutch under right arm prn to help with LLE pain. Able to ambulate without assistive device. Pt requiring some assist donning socks but otherwise independent. Driving and ambulating limited community distance due to SOB and decreased energy     Hand Dominance   Dominant Hand: Right    Extremity/Trunk Assessment   Upper Extremity Assessment: Defer to OT evaluation           Lower Extremity Assessment: LLE  deficits/detail   LLE Deficits / Details: ROM 25-90. Knee extension still very tight  Cervical / Trunk Assessment: Normal  Communication   Communication: No difficulties  Cognition Arousal/Alertness: Awake/alert Behavior During Therapy: WFL for tasks assessed/performed Overall Cognitive Status: Within Functional  Limits for tasks assessed                      General Comments General comments (skin integrity, edema, etc.): reviewed various ways to stretch knee extension and proper positioning within zero knee foam    Exercises General Exercises - Lower Extremity Ankle Circles/Pumps: AROM;Both;Supine;10 reps Quad Sets: AROM;Left;10 reps;Seated;Supine      Assessment/Plan    PT Assessment All further PT needs can be met in the next venue of care  PT Diagnosis Difficulty walking;Abnormality of gait;Acute pain   PT Problem List Decreased strength;Decreased range of motion;Decreased mobility;Pain  PT Treatment Interventions     PT Goals (Current goals can be found in the Care Plan section) Acute Rehab PT Goals Patient Stated Goal: home today PT Goal Formulation: All assessment and education complete, DC therapy    Frequency     Barriers to discharge        Co-evaluation               End of Session Equipment Utilized During Treatment: Gait belt Activity Tolerance: Patient tolerated treatment well Patient left: in chair;with call bell/phone within reach;with family/visitor present Nurse Communication: Mobility status    Functional Assessment Tool Used: clinical judgement Functional Limitation: Mobility: Walking and moving around Mobility: Walking and Moving Around Current Status (Z6109): At least 1 percent but less than 20 percent impaired, limited or restricted Mobility: Walking and Moving Around Goal Status [phone]): At least 1 percent but less than 20 percent impaired, limited or restricted Mobility: Walking and Moving Around Discharge Status [phone]): At least 1 percent but less than 20 percent impaired, limited or restricted    Time: 1221-1311 PT Time Calculation (min) (ACUTE ONLY): 50 min   Charges:   PT Evaluation $PT Eval Moderate Complexity: 1 Procedure PT Treatments $Gait Training: 8-22 mins $Therapeutic Activity: 8-22 mins   PT G Codes:   PT G-Codes  **NOT FOR INPATIENT CLASS** Functional Assessment Tool Used: clinical judgement Functional Limitation: Mobility: Walking and moving around Mobility: Walking and Moving Around Current Status (B1478): At least 1 percent but less than 20 percent impaired, limited or restricted Mobility: Walking and Moving Around Goal Status [phone]): At least 1 percent but less than 20 percent impaired, limited or restricted Mobility: Walking and Moving Around Discharge Status [phone]): At least 1 percent but less than 20 percent impaired, limited or restricted   Leighton Roach, PT  Acute Rehab Services  [phone]  Leighton Roach 08/09/2015, 2:29 PM

## 2015-08-09 NOTE — Discharge Summary (Signed)
Physician Discharge Summary  Patient ID: Richard [name] MRN: [id number] DOB/AGE: 54/27/1962 54 y.o.  Admit date: 08/08/2015 Discharge date: 08/09/2015  Admission Diagnoses:  Arthrofibrosis of total knee arthroplasty [facility])  Discharge Diagnoses:  Principal Problem:   Arthrofibrosis of total knee arthroplasty [facility]) Active Problems:   Morbid obesity (HCC)   OSA (obstructive sleep apnea)   Pulmonary embolism (HCC)   Peroneal DVT (deep venous thrombosis) ([place])   Past Medical History  Diagnosis Date  . Obesity   . Hypertensive heart disease   . Chest pain     a. 2015 Abnl MV;  b. 2015 Cath: nl cors.  . Chronic diastolic CHF (congestive heart failure) ([place])     a. 03/2015 Echo: EF 55-65%, poor windows, mildly dil RV.  Marland Kitchen GERD (gastroesophageal reflux disease)   . Arthritis   . Urine incontinence   . Primary localized osteoarthritis of right knee     a. 02/2014 s/p R Partial Knee arthroplasty.  . Primary localized osteoarthritis of left knee     a. 11/2014 s/p L TKA.  Marland Kitchen Stiffness of left knee     a. 12/2014 s/p manipulation under anesthesia.  Marland Kitchen DVT (deep venous thrombosis) ([place])     a. 03/2015 U/S: occlusive DVT w/in the distal aspect of the Left fem vein through the L popliteal vein.  . Bilateral pulmonary embolism ([place])     a. 03/2015 CTA: acute bilat PE.  Marland Kitchen Hypertension   . Dysrhythmia     PAF  . Arthrofibrosis of total knee arthroplasty ([place]) 05/30/2015  . OSA (obstructive sleep apnea)     uses CPAP nightly    Surgeries: Procedure(s): LEFT KNEE MANIPULATION WITH ARTHROSCOPIC LYSIS OF ADHESIONS AND ASPIRATION on 08/08/2015   Consultants (if any):    Discharged Condition: Improved  Hospital Course: Richard [name] is an 55 y.o. male who was admitted 08/08/2015 with a diagnosis of Arthrofibrosis of total knee arthroplasty ([place]) and went to the operating room on 08/08/2015 and underwent the above named procedures.    He was given perioperative antibiotics:  Anti-infectives     Start     Dose/Rate Route Frequency Ordered Stop   08/08/15 2200  ceFAZolin (ANCEF) IVPB 2g/100 mL premix     2 g 200 mL/hr over 30 Minutes Intravenous Every 6 hours 08/08/15 2112 08/09/15 0430   08/08/15 1330  ceFAZolin (ANCEF) 3 g in dextrose 5 % 50 mL IVPB     3 g 130 mL/hr over 30 Minutes Intravenous To ShortStay Surgical 08/07/15 1427 08/08/15 1415    .  He was given sequential compression devices, early ambulation, and eliquis for DVT prophylaxis.  His motion was much improved after surgery, 5-90 degrees.  Used CPM, started on trental and prednisone.    He benefited maximally from the hospital stay and there were no complications.    Recent vital signs:  Filed Vitals:   08/09/15 0448 08/09/15 0936  BP: 109/60   Pulse: 48 54  Temp: 97.6 F (36.4 C)   Resp: 16     Recent laboratory studies:  Lab Results  Component Value Date   HGB 13.1 08/04/2015   HGB 13.4 05/30/2015   HGB 12.6* 04/11/2015   Lab Results  Component Value Date   WBC 7.6 08/04/2015   PLT 289 08/04/2015   Lab Results  Component Value Date   INR 1.59* 05/30/2015   Lab Results  Component Value Date   NA 137 08/04/2015   K 4.2 08/04/2015   CL  104 08/04/2015   CO2 25 08/04/2015   BUN 20 08/04/2015   CREATININE 1.16 08/04/2015   GLUCOSE 78 08/04/2015    Discharge Medications:     Medication List    TAKE these medications        apixaban 5 MG Tabs tablet  Commonly known as:  ELIQUIS  Take 1 tablet (5 mg total) by mouth 2 (two) times daily.     baclofen 10 MG tablet  Commonly known as:  LIORESAL  Take 1 tablet (10 mg total) by mouth 3 (three) times daily as needed for muscle spasms.     esomeprazole 40 MG capsule  Commonly known as:  NEXIUM  Take 40 mg by mouth daily as needed (for acid reflux).     furosemide 40 MG tablet  Commonly known as:  LASIX  TAKE 1 TABLET (40 MG TOTAL) BY MOUTH DAILY.     methylPREDNISolone 4 MG Tbpk tablet  Commonly known as:  MEDROL DOSEPAK  Take as  prescribed, dispense 1 pack     metoprolol succinate 25 MG 24 hr tablet  Commonly known as:  TOPROL-XL  Take 1 tablet (25 mg total) by mouth 2 (two) times daily. Take with or immediately following a meal.     ondansetron 4 MG tablet  Commonly known as:  ZOFRAN  Take 1 tablet (4 mg total) by mouth every 8 (eight) hours as needed for nausea or vomiting.     oxyCODONE-acetaminophen 5-325 MG tablet  Commonly known as:  ROXICET  Take 1-2 tablets by mouth every 6 (six) hours as needed for severe pain.     pentoxifylline 400 MG CR tablet  Commonly known as:  TRENTAL  Take 1 tablet (400 mg total) by mouth 3 (three) times daily with meals.     senna-docusate 8.6-50 MG tablet  Commonly known as:  Senokot-S  Take 2 tablets by mouth 2 (two) times daily.     traMADol 50 MG tablet  Commonly known as:  ULTRAM  TAKE 1 TABLET (50 mg) BY MOUTH EVERY 12 HOURS AS NEEDED for pain        Diagnostic Studies: Dg Knee Left Port  08/08/2015  CLINICAL DATA:  Status post knee replacement EXAM: PORTABLE LEFT KNEE - 1-2 VIEW COMPARISON:  None. FINDINGS: The patient is status post left knee replacement. Femoral and tibial components are in good position. Postoperative soft tissue swelling and gas is identified. No acute abnormalities. IMPRESSION: Status post left knee replacement. Electronically Signed   By: Dorise Bullion III M.D   On: 08/08/2015 18:30    Disposition: 01-Home or Self Care        Follow-up Information    Follow up with Melondy Blanchard P, MD. Call in 1 week.   Specialty:  Orthopedic Surgery   Contact information:   [place] [id number] [phone]        Signed: Johnny Bridge 08/09/2015, 11:55 AM

## 2015-08-09 NOTE — Discharge Instructions (Signed)
Diet: As you were doing prior to hospitalization   Shower:  May shower but keep the wounds dry, use an occlusive plastic wrap, NO SOAKING IN TUB.  If the bandage gets wet, change with a clean dry gauze.  If you have a splint on, leave the splint in place and keep the splint dry with a plastic bag.  Dressing:  You may change your dressing 3-5 days after surgery, unless you have a splint.  If you have a splint, then just leave the splint in place and we will change your bandages during your first follow-up appointment.    If you had hand or foot surgery, we will plan to remove your stitches in about 2 weeks in the office.  For all other surgeries, there are sticky tapes (steri-strips) on your wounds and all the stitches are absorbable.  Leave the steri-strips in place when changing your dressings, they will peel off with time, usually 2-3 weeks.  Activity:  Increase activity slowly as tolerated, but follow the weight bearing instructions below.  The rules on driving is that you can not be taking narcotics while you drive, and you must feel in control of the vehicle.    Weight Bearing:   As tolerated.    To prevent constipation: you may use a stool softener such as -  Colace (over the counter) 100 mg by mouth twice a day  Drink plenty of fluids (prune juice may be helpful) and high fiber foods Miralax (over the counter) for constipation as needed.    Itching:  If you experience itching with your medications, try taking only a single pain pill, or even half a pain pill at a time.  You may take up to 10 pain pills per day, and you can also use benadryl over the counter for itching or also to help with sleep.   Precautions:  If you experience chest pain or shortness of breath - call 911 immediately for transfer to the hospital emergency department!!  If you develop a fever greater that 101 F, purulent drainage from wound, increased redness or drainage from wound, or calf pain -- Call the office at  [phone]                                                Follow- Up Appointment:  Please call for an appointment to be seen in 2 weeks [place] - [phone]     Information on my medicine - ELIQUIS (apixaban)  This medication education was reviewed with me or my healthcare representative as part of my discharge preparation.  The pharmacist that spoke with me during my hospital stay was:  Duayne Cal, [facility]  Why was Eliquis prescribed for you? Eliquis was prescribed for you to reduce the risk of a blood clot forming that can cause a stroke if you have a medical condition called atrial fibrillation (a type of irregular heartbeat).  What do You need to know about Eliquis ? Take your Eliquis TWICE DAILY - one tablet in the morning and one tablet in the evening with or without food. If you have difficulty swallowing the tablet whole please discuss with your pharmacist how to take the medication safely.  Take Eliquis exactly as prescribed by your doctor and DO NOT stop taking Eliquis without talking to the doctor who prescribed the medication.  Stopping may  increase your risk of developing a stroke.  Refill your prescription before you run out.  After discharge, you should have regular check-up appointments with your healthcare provider that is prescribing your Eliquis.  In the future your dose may need to be changed if your kidney function or weight changes by a significant amount or as you get older.  What do you do if you miss a dose? If you miss a dose, take it as soon as you remember on the same day and resume taking twice daily.  Do not take more than one dose of ELIQUIS at the same time to make up a missed dose.  Important Safety Information A possible side effect of Eliquis is bleeding. You should call your healthcare provider right away if you experience any of the following: ? Bleeding from an injury or your nose that does not stop. ? Unusual colored urine (red or  dark brown) or unusual colored stools (red or black). ? Unusual bruising for unknown reasons. ? A serious fall or if you hit your head (even if there is no bleeding).  Some medicines may interact with Eliquis and might increase your risk of bleeding or clotting while on Eliquis. To help avoid this, consult your healthcare provider or pharmacist prior to using any new prescription or non-prescription medications, including herbals, vitamins, non-steroidal anti-inflammatory drugs (NSAIDs) and supplements.  This website has more information on Eliquis (apixaban): http://www.eliquis.com/eliquis/home

## 2015-08-09 NOTE — Progress Notes (Signed)
Patient received a call from the MD office that his prescription for pain is by the front desk,Patient agreed to take that pain med instead of the percocet. Home instruction were given. No question verbalized.

## 2015-08-09 NOTE — Progress Notes (Signed)
Shift notes: patient is resting in the bed with eyes closed, has had an uneventful night, CPAP applied, no acute distress noted, medicated with schedule Toradol and Robaxin for pain, voiding without difficulty. Will continue to monitor.

## 2015-08-09 NOTE — Progress Notes (Signed)
Patient for discharge , discussed discharge meds particularly pain med and patient stated the ordered med will not work  With his pain, MD paged and stated to tell patient to dropped by the office for another prescription. Explained that to the patient but patient was very  upset ,stated that he will go ahead and take whatever was written at this time. This Probation officer stated that per MD office will be open till 9.

## 2015-08-09 NOTE — Progress Notes (Signed)
Patient ID: Richard [name], male   DOB: 04/03/1960, 55 y.o.   MRN: [id number]     Subjective:  Patient reports pain as mild.  He is in bed and in no acute distress.  Denies any CP or SOB  Objective:   VITALS:   Filed Vitals:   08/08/15 2015 08/08/15 2120 08/09/15 0046 08/09/15 0448  BP: 127/64 110/61 112/55 109/60  Pulse: 66 62 52 48  Temp: 97.8 F (36.6 C) 98.2 F (36.8 C) 98.3 F (36.8 C) 97.6 F (36.4 C)  TempSrc:  Oral Oral Oral  Resp: 21 16 16 16   Height:      Weight:      SpO2: 97% 98% 98% 97%    ABD soft Sensation intact distally Dorsiflexion/Plantar flexion intact Incision: dressing C/D/I and no drainage   Lab Results  Component Value Date   WBC 7.6 08/04/2015   HGB 13.1 08/04/2015   HCT 42.2 08/04/2015   MCV 81.9 08/04/2015   PLT 289 08/04/2015   BMET    Component Value Date/Time   NA 137 08/04/2015 1140   K 4.2 08/04/2015 1140   CL 104 08/04/2015 1140   CO2 25 08/04/2015 1140   GLUCOSE 78 08/04/2015 1140   BUN 20 08/04/2015 1140   CREATININE 1.16 08/04/2015 1140   CALCIUM 9.4 08/04/2015 1140   GFRNONAA >60 08/04/2015 1140   GFRAA >60 08/04/2015 1140     Assessment/Plan: 1 Day Post-Op   Principal Problem:   Arthrofibrosis of total knee arthroplasty (HCC) Active Problems:   Morbid obesity (HCC)   OSA (obstructive sleep apnea)   Pulmonary embolism (HCC)   Peroneal DVT (deep venous thrombosis) (HCC)   Advance diet Up with therapy WBAT  Dry dressing PRN Plan for DC home later today after PT   [name] 08/09/2015, 9:20 AM  Discussed and agree with above.   Marchia Bond, MD Cell [phone]

## 2015-08-09 NOTE — Progress Notes (Signed)
Occupational Therapy Evaluation Patient Details Name: JOBY WINT MRN: [id number] DOB: 07/30/1960 Today's Date: 08/09/2015    History of Present Illness s/p LEFT KNEE MANIPULATION WITH ARTHROSCOPIC LYSIS OF ADHESIONS AND ASPIRATION (Left)   Clinical Impression   All OT education completed and pt questions answered. No further OT needs at this time. Will sign off.    Follow Up Recommendations  No OT follow up   Equipment Recommendations  None recommended by OT    Recommendations for Other Services       Precautions / Restrictions Precautions Precautions: Knee Restrictions Weight Bearing Restrictions: No Other Position/Activity Restrictions: WBAT      Mobility Bed Mobility               General bed mobility comments: NT -- in recliner  Transfers                 General transfer comment: NT -- pt declined as he had just been up with PT, reports familiarity with toilet transfer; reviewed tub/shower transfer technique verbally    Balance                                            ADL Overall ADL's : Needs assistance/impaired Eating/Feeding: Independent;Sitting   Grooming: Set up;Sitting   Upper Body Bathing: Set up;Sitting   Lower Body Bathing: Minimal assistance;Sit to/from stand   Upper Body Dressing : Set up;Sitting   Lower Body Dressing: Minimal assistance;Sit to/from stand                 General ADL Comments: Patient received up in chair, reports a little difficulty getting dressed in personal clothing but family assisted him and will assist him at discharge. Reviewed tub/shower transfer technique for when patient is allowed to shower. Patient asking for new set of crutches as his crutches at home are broken, informed secretary who called ortho tech. Patient had no other concerns and was familiar with OT techniques from previous procedure a few weeks ago.     Vision     Perception     Praxis       Pertinent Vitals/Pain Pain Assessment: 0-10 Pain Score: 5  Pain Location: L leg Pain Descriptors / Indicators: Aching;Sore Pain Intervention(s): Monitored during session;Limited activity within patient's tolerance     Hand Dominance     Extremity/Trunk Assessment Upper Extremity Assessment Upper Extremity Assessment: Overall WFL for tasks assessed   Lower Extremity Assessment Lower Extremity Assessment: Defer to PT evaluation       Communication Communication Communication: No difficulties   Cognition Arousal/Alertness: Awake/alert Behavior During Therapy: WFL for tasks assessed/performed Overall Cognitive Status: Within Functional Limits for tasks assessed                     General Comments       Exercises       Shoulder Instructions      Home Living Family/patient expects to be discharged to:: Private residence Living Arrangements: Spouse/significant other;Parent;Children Available Help at Discharge: Family Type of Home: House Home Access: Stairs to enter [employer] of Steps: 3 Entrance Stairs-Rails: Right Home Layout: One level     Bathroom Shower/Tub: Tub/shower unit Shower/tub characteristics: Curtain Biochemist, clinical: Handicapped height Bathroom Accessibility: Yes   Home Equipment: Cane - quad;Crutches;Walker - standard;Bedside commode          Prior  Functioning/Environment Level of Independence: Independent             OT Diagnosis: Acute pain   OT Problem List: Decreased strength;Decreased range of motion;Pain   OT Treatment/Interventions:      OT Goals(Current goals can be found in the care plan section) Acute Rehab OT Goals Patient Stated Goal: home today OT Goal Formulation: All assessment and education complete, DC therapy  OT Frequency:     Barriers to D/C:            Co-evaluation              End of Session Nurse Communication: Mobility status  Activity Tolerance: Patient tolerated  treatment well Patient left: in chair;with call bell/phone within reach;with family/visitor present   Time: [id number] OT Time Calculation (min): 10 min Charges:  OT General Charges $OT Visit: 1 Procedure OT Evaluation $OT Eval Low Complexity: 1 Procedure G-Codes: OT G-codes **NOT FOR INPATIENT CLASS** Functional Assessment Tool Used: clinical judgment Functional Limitation: Self care Self Care Current Status [id number]): At least 1 percent but less than 20 percent impaired, limited or restricted Self Care Goal Status [id number]): At least 1 percent but less than 20 percent impaired, limited or restricted Self Care Discharge Status [phone]): At least 1 percent but less than 20 percent impaired, limited or restricted  Brit Wernette A 08/09/2015, 2:04 PM

## 2015-08-19 ENCOUNTER — Emergency Department (HOSPITAL_COMMUNITY): Payer: Managed Care, Other (non HMO)

## 2015-08-19 ENCOUNTER — Emergency Department (HOSPITAL_COMMUNITY)
Admission: EM | Admit: 2015-08-19 | Discharge: 2015-08-19 | Disposition: A | Discharge status: Home / Self Care | Payer: Managed Care, Other (non HMO) | Attending: Emergency Medicine | Admitting: Emergency Medicine

## 2015-08-19 ENCOUNTER — Encounter (HOSPITAL_COMMUNITY): Payer: Self-pay

## 2015-08-19 DIAGNOSIS — M549 Dorsalgia, unspecified: Secondary | ICD-10-CM | POA: Insufficient documentation

## 2015-08-19 DIAGNOSIS — M546 Pain in thoracic spine: Secondary | ICD-10-CM

## 2015-08-19 DIAGNOSIS — Z7901 Long term (current) use of anticoagulants: Secondary | ICD-10-CM | POA: Insufficient documentation

## 2015-08-19 DIAGNOSIS — I5032 Chronic diastolic (congestive) heart failure: Secondary | ICD-10-CM | POA: Diagnosis not present

## 2015-08-19 DIAGNOSIS — I110 Hypertensive heart disease with heart failure: Secondary | ICD-10-CM | POA: Insufficient documentation

## 2015-08-19 DIAGNOSIS — Z96651 Presence of right artificial knee joint: Secondary | ICD-10-CM | POA: Insufficient documentation

## 2015-08-19 DIAGNOSIS — Z79899 Other long term (current) drug therapy: Secondary | ICD-10-CM | POA: Diagnosis not present

## 2015-08-19 LAB — BASIC METABOLIC PANEL
Anion gap: 5 (ref 5–15)
BUN: 24 mg/dL — ABNORMAL HIGH (ref 6–20)
CO2: 27 mmol/L (ref 22–32)
Calcium: 9 mg/dL (ref 8.9–10.3)
Chloride: 107 mmol/L (ref 101–111)
Creatinine, Ser: 1.21 mg/dL (ref 0.61–1.24)
GFR calc Af Amer: >60 mL/min (ref >60)
GFR calc non Af Amer: >60 mL/min (ref >60)
Glucose, Bld: 94 mg/dL (ref 65–99)
Potassium: 4.3 mmol/L (ref 3.5–5.1)
Sodium: 139 mmol/L (ref 135–145)

## 2015-08-19 LAB — CBC WITH DIFFERENTIAL/PLATELET
Basophils Absolute: 0 10*3/uL (ref 0.0–0.1)
Basophils Relative: 0 %
Eosinophils Absolute: 0.2 10*3/uL (ref 0.0–0.7)
Eosinophils Relative: 2 %
HCT: 42 % (ref 39.0–52.0)
Hemoglobin: 12.8 g/dL — ABNORMAL LOW (ref 13.0–17.0)
Lymphocytes Relative: 25 %
Lymphs Abs: 2.4 10*3/uL (ref 0.7–4.0)
MCH: 25.7 pg — ABNORMAL LOW (ref 26.0–34.0)
MCHC: 30.5 g/dL (ref 30.0–36.0)
MCV: 84.2 fL (ref 78.0–100.0)
Monocytes Absolute: 0.9 10*3/uL (ref 0.1–1.0)
Monocytes Relative: 10 %
Neutro Abs: 6.2 10*3/uL (ref 1.7–7.7)
Neutrophils Relative %: 63 %
Platelets: 254 10*3/uL (ref 150–400)
RBC: 4.99 MIL/uL (ref 4.22–5.81)
RDW: 16.1 % — ABNORMAL HIGH (ref 11.5–15.5)
WBC: 9.8 10*3/uL (ref 4.0–10.5)

## 2015-08-19 LAB — TROPONIN I
Troponin I: <0.03 ng/mL (ref <0.03)
Troponin I: <0.03 ng/mL (ref <0.03)

## 2015-08-19 MED ORDER — IOPAMIDOL (ISOVUE-370) INJECTION 76%
INTRAVENOUS | Status: AC
  Administered 2015-08-19: 100 mL
  Filled 2015-08-19: qty 100

## 2015-08-19 MED ORDER — DIAZEPAM 2 MG PO TABS
2.0000 mg | ORAL_TABLET | Freq: Once | ORAL | Status: AC
  Administered 2015-08-19: 2 mg via ORAL
  Filled 2015-08-19: qty 1

## 2015-08-19 MED ORDER — MORPHINE SULFATE (PF) 4 MG/ML IV SOLN
4.0000 mg | Freq: Once | INTRAVENOUS | Status: AC
  Administered 2015-08-19: 4 mg via INTRAVENOUS
  Filled 2015-08-19: qty 1

## 2015-08-19 MED ORDER — DIAZEPAM 2 MG PO TABS: 2.0000 mg | ORAL_TABLET | Freq: Three times a day (TID) | ORAL | 0 refills | Status: DC | PRN

## 2015-08-19 MED ORDER — CYCLOBENZAPRINE HCL 10 MG PO TABS
10.0000 mg | ORAL_TABLET | Freq: Two times a day (BID) | ORAL | 0 refills | Status: DC | PRN
Start: 2015-08-19 — End: 2015-10-26

## 2015-08-19 MED ORDER — HYDROCODONE-ACETAMINOPHEN 5-325 MG PO TABS
1.0000 | ORAL_TABLET | Freq: Once | ORAL | Status: AC
  Administered 2015-08-19: 1 via ORAL
  Filled 2015-08-19: qty 1

## 2015-08-19 NOTE — ED Provider Notes (Signed)
[facility] DEPT Provider Note   CSN: [id number] Arrival date & time: 08/19/15  [id number]  First Provider Contact:  First MD Initiated Contact with Patient 08/19/15 [phone]        History   Chief Complaint Chief Complaint  Patient presents with  . Back Pain    HPI Richard [name] is a 55 y.o. male.  Patient presents with 3-4 week history of right thoracic back pain that is constant, worse with movement. Denies any falls or injuries. States this pain is been there for 3-4 weeks. He's been taking his pain medication he takes for his knee with partial relief. He came in today because he had a very uncomfortable night. Denies any chest pain or shortness of breath. Denies any cough or fever. Patient notably has a history of PE and DVT on Eliquis which she states compliance. He had a knee arthroscopy 10 days ago and his knee is still swollen but he says he saw his orthopedist yesterday and this was improving. He denies any fevers, chills, nausea or vomiting. Denies any IV drug abuse. Denies any bowel or bladder incontinence. Denies any chest pain or shortness of breath. No cough or fever.   The history is provided by the patient and a relative.  Back Pain   Pertinent negatives include no chest pain, no fever, no headaches, no abdominal pain, no dysuria and no weakness.    Past Medical History:  Diagnosis Date  . Arthritis   . Arthrofibrosis of total knee arthroplasty ([place]) 05/30/2015  . Bilateral pulmonary embolism ([place])    a. 03/2015 CTA: acute bilat PE.  Marland Kitchen Chest pain    a. 2015 Abnl MV;  b. 2015 Cath: nl cors.  . Chronic diastolic CHF (congestive heart failure) ([place])    a. 03/2015 Echo: EF 55-65%, poor windows, mildly dil RV.  Marland Kitchen DVT (deep venous thrombosis) ([place])    a. 03/2015 U/S: occlusive DVT w/in the distal aspect of the Left fem vein through the L popliteal vein.  Marland Kitchen Dysrhythmia    PAF  . GERD (gastroesophageal reflux disease)   . Hypertension   . Hypertensive heart disease   .  Obesity   . OSA (obstructive sleep apnea)    uses CPAP nightly  . Primary localized osteoarthritis of left knee    a. 11/2014 s/p L TKA.  . Primary localized osteoarthritis of right knee    a. 02/2014 s/p R Partial Knee arthroplasty.  . Stiffness of left knee    a. 12/2014 s/p manipulation under anesthesia.  . Urine incontinence     Patient Active Problem List   Diagnosis Date Noted  . Arthrofibrosis of total knee arthroplasty ([place]) 05/30/2015  . PAF (paroxysmal atrial fibrillation) ([place]) 03/30/2015  . Peroneal DVT (deep venous thrombosis) ([place])   . Pulmonary embolism ([place]) 03/27/2015  . Hypertension 08/17/2011  . LVH (left ventricular hypertrophy) 08/17/2011  . GERD (gastroesophageal reflux disease) 08/17/2011  . Morbid obesity ([place]) 08/16/2011  . OSA (obstructive sleep apnea) 08/16/2011    Past Surgical History:  Procedure Laterality Date  . APPENDECTOMY    . CARDIAC CATHETERIZATION    . EXAM UNDER ANESTHESIA WITH MANIPULATION OF KNEE Left 05/30/2015   Procedure: EXAM UNDER ANESTHESIA WITH MANIPULATION OF LEFT KNEE;  Surgeon: Marchia Bond, MD;  Location: [facility];  Service: Orthopedics;  Laterality: Left;  . IVC FILTER PLACEMENT ([facility] HX)  04/13/15  . JOINT REPLACEMENT    . KNEE ARTHROSCOPY Left 08/08/2015   Procedure: LEFT KNEE MANIPULATION WITH  ARTHROSCOPIC LYSIS OF ADHESIONS AND ASPIRATION;  Surgeon: Marchia Bond, MD;  Location: [place] [place];  Service: Orthopedics;  Laterality: Left;  . KNEE CLOSED REDUCTION Left 01/20/2015   Procedure: LEFT KNEE MANIPULATION;  Surgeon: Marchia Bond, MD;  Location: [facility];  Service: Orthopedics;  Laterality: Left;  . LEFT HEART CATHETERIZATION WITH CORONARY ANGIOGRAM N/A 01/12/2014   Procedure: LEFT HEART CATHETERIZATION WITH CORONARY ANGIOGRAM;  Surgeon: Jettie Booze, MD;  Location: [facility] C[facility] LAB;  Service: Cardiovascular;  Laterality: N/A;  . ORTHOPEDIC SURGERY Left    arthroscopy x3  . PARTIAL KNEE ARTHROPLASTY Right  02/25/2014   Procedure: RIGHT KNEE ARTHROPLASTY CONDYLE AND PLATEAU MEDIAL COMPARTMENT ;  Surgeon: Johnny Bridge, MD;  Location: [facility];  Service: Orthopedics;  Laterality: Right;  . PERIPHERAL VASCULAR CATHETERIZATION N/A 03/29/2015   Procedure: IVC Filter Insertion, and possible thrombectomy;  Surgeon: Katha Cabal, MD;  Location: [place] CV LAB;  Service: Cardiovascular;  Laterality: N/A;  . TOTAL KNEE ARTHROPLASTY  12/06/2014   Procedure: TOTAL KNEE ARTHROPLASTY;  Surgeon: Marchia Bond, MD;  Location: [facility];  Service: Orthopedics;;       Home Medications    Prior to Admission medications   Medication Sig Start Date End Date Taking? Authorizing Provider  apixaban (ELIQUIS) 5 MG TABS tablet Take 1 tablet (5 mg total) by mouth 2 (two) times daily. 04/30/15  Yes Isaiah Serge, NP  baclofen (LIORESAL) 10 MG tablet Take 1 tablet (10 mg total) by mouth 3 (three) times daily as needed for muscle spasms. 05/30/15  Yes Marchia Bond, MD  esomeprazole (NEXIUM) 40 MG capsule Take 40 mg by mouth daily as needed (for acid reflux).    Yes Historical Provider, MD  furosemide (LASIX) 40 MG tablet TAKE 1 TABLET (40 MG TOTAL) BY MOUTH DAILY. 07/17/15  Yes Jearld Fenton, NP  metoprolol succinate (TOPROL-XL) 25 MG 24 hr tablet Take 1 tablet (25 mg total) by mouth 2 (two) times daily. Take with or immediately following a meal. 06/12/15  Yes Minna Merritts, MD  ondansetron (ZOFRAN) 4 MG tablet Take 1 tablet (4 mg total) by mouth every 8 (eight) hours as needed for nausea or vomiting. 08/08/15  Yes Marchia Bond, MD  oxycodone (OXY-IR) 5 MG capsule Take 5 mg by mouth every 6 (six) hours as needed for pain.  08/09/15  Yes Historical Provider, MD  oxyCODONE-acetaminophen (ROXICET) 5-325 MG tablet Take 1-2 tablets by mouth every 6 (six) hours as needed for severe pain. 08/08/15  Yes Marchia Bond, MD  pentoxifylline (TRENTAL) 400 MG CR tablet Take 1 tablet (400 mg total) by mouth 3 (three)  times daily with meals. 08/08/15  Yes Marchia Bond, MD  senna-docusate (SENOKOT-S) 8.6-50 MG tablet Take 2 tablets by mouth 2 (two) times daily.    Yes Historical Provider, MD  traMADol (ULTRAM) 50 MG tablet TAKE 1 TABLET (50 mg) BY MOUTH EVERY 12 HOURS AS NEEDED for pain 08/08/15  Yes Marchia Bond, MD  cyclobenzaprine (FLEXERIL) 10 MG tablet Take 1 tablet (10 mg total) by mouth 2 (two) times daily as needed for muscle spasms. 08/19/15   Ezequiel Essex, MD  diazepam (VALIUM) 2 MG tablet Take 1 tablet (2 mg total) by mouth every 8 (eight) hours as needed for anxiety. 08/19/15   Ezequiel Essex, MD    Family History Family History  Problem Relation Age of Onset  . Coronary artery disease Mother   . Hyperlipidemia Mother   . Hypertension Mother   .  Arthritis Mother   . Coronary artery disease Father   . Heart disease Paternal Grandmother   . Alzheimer's disease Paternal Grandfather     Social History Social History  Substance Use Topics  . Smoking status: Never Smoker  . Smokeless tobacco: Never Used  . Alcohol use Yes     Comment: occassional     Allergies   Penicillins   Review of Systems Review of Systems  Constitutional: Negative for activity change, appetite change and fever.  Eyes: Negative for visual disturbance.  Respiratory: Negative for chest tightness and shortness of breath.   Cardiovascular: Positive for leg swelling. Negative for chest pain.  Gastrointestinal: Negative for abdominal pain, nausea and vomiting.  Genitourinary: Negative for dysuria, hematuria and testicular pain.  Musculoskeletal: Positive for back pain. Negative for neck pain and neck stiffness.  Neurological: Negative for dizziness, weakness, light-headedness and headaches.   A complete 10 system review of systems was obtained and all systems are negative except as noted in the HPI and PMH.    Physical Exam Updated Vital Signs BP 133/68   Pulse (!) 49   Temp 98.5 F (36.9 C) (Oral)   Resp  15   SpO2 98%   Physical Exam  Constitutional: He is oriented to person, place, and time. He appears well-developed and well-nourished. No distress.  uncomfortable  HENT:  Head: Normocephalic and atraumatic.  Mouth/Throat: Oropharynx is clear and moist. No oropharyngeal exudate.  Eyes: Conjunctivae and EOM are normal. Pupils are equal, round, and reactive to light.  Neck: Normal range of motion. Neck supple.  No meningismus.  Cardiovascular: Normal rate, regular rhythm, normal heart sounds and intact distal pulses.   No murmur heard. Pulmonary/Chest: Effort normal and breath sounds normal. No respiratory distress.      R paraspinal thoracic tenderness. No midline tenderness  Abdominal: Soft. There is no tenderness. There is no rebound and no guarding.  Musculoskeletal: Normal range of motion. He exhibits tenderness. He exhibits no edema.  L knee edematous with effusion, reduced ROM. No warmth or erythema Patient states improving since surgery  Neurological: He is alert and oriented to person, place, and time. No cranial nerve deficit. He exhibits normal muscle tone. Coordination normal.  No ataxia on finger to nose bilaterally. No pronator drift. 5/5 strength throughout. CN 2-12 intact.Equal grip strength. Sensation intact.  Equal grip strengths.  Skin: Skin is warm.  Psychiatric: He has a normal mood and affect. His behavior is normal.  Nursing note and vitals reviewed.    ED Treatments / Results  Labs (all labs ordered are listed, but only abnormal results are displayed) Labs Reviewed  CBC WITH DIFFERENTIAL/PLATELET - Abnormal; Notable for the following:       Result Value   Hemoglobin 12.8 (*)    MCH 25.7 (*)    RDW 16.1 (*)    All other components within normal limits  BASIC METABOLIC PANEL - Abnormal; Notable for the following:    BUN 24 (*)    All other components within normal limits  TROPONIN I  TROPONIN I    EKG  EKG Interpretation  Date/Time:  Saturday  August 19 2015 09:35:09 EDT Ventricular Rate:  50 PR Interval:    QRS Duration: 104 QT Interval:  445 QTC Calculation: 406 R Axis:   -27 Text Interpretation:  Sinus rhythm Incomplete RBBB and LAFB Low voltage, precordial leads RSR' in V1 or V2, right VCD or RVH Baseline wander in lead(s) V1 No significant change was found Confirmed  by Wyvonnia Dusky  MD, [place] [phone]) on 08/19/2015 9:40:19 AM       Radiology Dg Chest 2 View  Result Date: 08/19/2015 CLINICAL DATA:  Mid back pain for 3 weeks. EXAM: CHEST  2 VIEW COMPARISON:  05/10/2015 FINDINGS: Heart and mediastinal contours are within normal limits. No focal opacities or effusions. No acute bony abnormality. IMPRESSION: No active cardiopulmonary disease. Electronically Signed   By: Rolm Baptise M.D.   On: 08/19/2015 09:38  Ct Angio Chest Pe W And/or Wo Contrast  Result Date: 08/19/2015 CLINICAL DATA:  55 year old male with back and chest pain following recent knee surgery. EXAM: CT ANGIOGRAPHY CHEST WITH CONTRAST TECHNIQUE: Multidetector CT imaging of the chest was performed using the standard protocol during bolus administration of intravenous contrast. Multiplanar CT image reconstructions and MIPs were obtained to evaluate the vascular anatomy. CONTRAST:  05/10/2015 and prior CTs COMPARISON:  None. FINDINGS: Cardiovascular: This is a technically satisfactory study. No acute pulmonary emboli are identified. Chronic pulmonary emboli has decreased in volume since the prior study with small webs now noted within right segmental pulmonary arteries. Cardiomegaly again identified. There is no evidence of thoracic aortic aneurysm. Mediastinum/Nodes: Mildly prominent bilateral mediastinal and bilateral hilar lymph nodes are unchanged. There is no evidence mediastinal mass or pericardial effusion. Lungs/Pleura: There is no evidence of airspace disease, consolidation, mass, nodule, pleural effusion or pneumothorax. Upper Abdomen: Hepatic steatosis noted.  Musculoskeletal: No acute or suspicious abnormality. Mild gynecomastia again noted. Review of the MIP images confirms the above findings. IMPRESSION: No evidence of acute pulmonary emboli. Near complete resolution of chronic pulmonary emboli with small residual webs in right segmental pulmonary arteries. No evidence of acute abnormality. Hepatic steatosis and mild gynecomastia again noted. Electronically Signed   By: Margarette Canada M.D.   On: 08/19/2015 12:12   Procedures Procedures (including critical care time)  Medications Ordered in ED Medications  HYDROcodone-acetaminophen (NORCO/VICODIN) 5-325 MG per tablet 1 tablet (1 tablet Oral Given 08/19/15 0943)  diazepam (VALIUM) tablet 2 mg (2 mg Oral Given 08/19/15 0943)  iopamidol (ISOVUE-370) 76 % injection (100 mLs  Contrast Given 08/19/15 1109)  morphine 4 MG/ML injection 4 mg (4 mg Intravenous Given 08/19/15 1237)     Initial Impression / Assessment and Plan / ED Course  I have reviewed the triage vital signs and the nursing notes.  Pertinent labs & imaging results that were available during my care of the patient were reviewed by me and considered in my medical decision making (see chart for details).  Clinical Course  1 month of right sided thoracic back pain without injury. No weakness, numbness or tingling. No bowel or bladder incontinence. No fever or vomiting. History of DVT and PE on Eliquis.  Chest x-ray negative. EKG unchanged. CT scan obtained and shows no acute pulmonary embolism. There is sequela of old emboli.  Troponin negative x2.  Patient with no midline pain, no neurological deficits. Low suspicion for epidural abscess or spinal pathology. Treat supportively now for probable muscle strain, follow-up with PCP may need MRI if symptoms persist. Return precautions discussed.  BP 133/68   Pulse (!) 49   Temp 98.5 F (36.9 C) (Oral)   Resp 15   SpO2 98%    Final Clinical Impressions(s) / ED Diagnoses   Final diagnoses:    Right-sided thoracic back pain    New Prescriptions Discharge Medication List as of 08/19/2015  1:44 PM    START taking these medications   Details  cyclobenzaprine (FLEXERIL) 10 MG  tablet Take 1 tablet (10 mg total) by mouth 2 (two) times daily as needed for muscle spasms., Starting Sat 08/19/2015, Print    diazepam (VALIUM) 2 MG tablet Take 1 tablet (2 mg total) by mouth every 8 (eight) hours as needed for anxiety., Starting Sat 08/19/2015, Print         Ezequiel Essex, MD 08/19/15 [phone]

## 2015-08-19 NOTE — ED Notes (Signed)
Pt ambulated to and from the bathroom with the help of a walker.  Pt tolerated well.

## 2015-08-19 NOTE — Discharge Instructions (Signed)
As we discussed there is no evidence of new blood clots in your lung. His pain is likely muscular. If it persists however your doctor may need to order an MRI. Return to the ED if you develop worsening pain, fever, weakness, numbness, tingling, incontinence or any other concerns.

## 2015-08-19 NOTE — ED Triage Notes (Addendum)
Patient here with 3 weeks of thoracic sharp back pain. States that he has had surgery on knees and just recently had revision on same. Denies trauma. States that the pain is worse with inspiration and hurts from his ribs through to his back. Denies cough and cold.

## 2015-08-24 ENCOUNTER — Encounter: Payer: Self-pay | Admitting: Internal Medicine

## 2015-08-24 ENCOUNTER — Ambulatory Visit (INDEPENDENT_AMBULATORY_CARE_PROVIDER_SITE_OTHER): Payer: Managed Care, Other (non HMO) | Admitting: Internal Medicine

## 2015-08-24 VITALS — BP 122/76 | HR 76 | Temp 97.9°F | Wt 270.0 lb

## 2015-08-24 DIAGNOSIS — M546 Pain in thoracic spine: Secondary | ICD-10-CM

## 2015-08-24 NOTE — Progress Notes (Signed)
Subjective:    Patient ID: Richard [name], male    DOB: 02/27/1960, 55 y.o.   MRN: [id number]  HPI  Pt presents to the clinic today for follow-up of ER visit on 08/19/15 for right thoracic back pain.  While in the ED, they completed a CXR and Troponins x 2 which were normal.  CBC showed H/H of 12.8/42.0, and CMP showed elevated BUN of 24.  A CT scan showed no acute PE.  EKG showed no acute changes.  He was prescribed Cyclobenzaprine and Diazepam, and instructed to follow-up with his PCP.  Today he reports continued right thoracic back pain.  He states the pain began 3-4 weeks ago, and is now moving up into the right shoulder.  He describes the pain as a constant stabbing "like someone stuck a knife in and twisted it."  He rates the pain at a severity of 10/10.  He denies numbness or tingling in the extremities, fever, chills, or decreased range of motion.  He has been taken the Cyclobenzaprine and Diazepam daily as prescribed with no relief.  He has also tried heat and ice with no relief.  He recently had left knee surgery and reports he has been walking with a crutch on the right side, but states he has been using the crutch for x3-4 months.      Review of Systems   Past Medical History:  Diagnosis Date  . Arthritis   . Arthrofibrosis of total knee arthroplasty ([place]) 05/30/2015  . Bilateral pulmonary embolism ([place])    a. 03/2015 CTA: acute bilat PE.  Marland Kitchen Chest pain    a. 2015 Abnl MV;  b. 2015 Cath: nl cors.  . Chronic diastolic CHF (congestive heart failure) ([place])    a. 03/2015 Echo: EF 55-65%, poor windows, mildly dil RV.  Marland Kitchen DVT (deep venous thrombosis) ([place])    a. 03/2015 U/S: occlusive DVT w/in the distal aspect of the Left fem vein through the L popliteal vein.  Marland Kitchen Dysrhythmia    PAF  . GERD (gastroesophageal reflux disease)   . Hypertension   . Hypertensive heart disease   . Obesity   . OSA (obstructive sleep apnea)    uses CPAP nightly  . Primary localized osteoarthritis of left  knee    a. 11/2014 s/p L TKA.  . Primary localized osteoarthritis of right knee    a. 02/2014 s/p R Partial Knee arthroplasty.  . Stiffness of left knee    a. 12/2014 s/p manipulation under anesthesia.  . Urine incontinence     Current Outpatient Prescriptions  Medication Sig Dispense Refill  . apixaban (ELIQUIS) 5 MG TABS tablet Take 1 tablet (5 mg total) by mouth 2 (two) times daily. 60 tablet 6  . baclofen (LIORESAL) 10 MG tablet Take 1 tablet (10 mg total) by mouth 3 (three) times daily as needed for muscle spasms. 30 each 0  . cyclobenzaprine (FLEXERIL) 10 MG tablet Take 1 tablet (10 mg total) by mouth 2 (two) times daily as needed for muscle spasms. 20 tablet 0  . diazepam (VALIUM) 2 MG tablet Take 1 tablet (2 mg total) by mouth every 8 (eight) hours as needed for anxiety. 6 tablet 0  . esomeprazole (NEXIUM) 40 MG capsule Take 40 mg by mouth daily as needed (for acid reflux).     . furosemide (LASIX) 40 MG tablet TAKE 1 TABLET (40 MG TOTAL) BY MOUTH DAILY. 30 tablet 5  . metoprolol succinate (TOPROL-XL) 25 MG 24 hr tablet Take  1 tablet (25 mg total) by mouth 2 (two) times daily. Take with or immediately following a meal. 90 tablet 3  . ondansetron (ZOFRAN) 4 MG tablet Take 1 tablet (4 mg total) by mouth every 8 (eight) hours as needed for nausea or vomiting. 30 tablet 0  . oxycodone (OXY-IR) 5 MG capsule Take 5 mg by mouth every 6 (six) hours as needed for pain.     Marland Kitchen oxyCODONE-acetaminophen (ROXICET) 5-325 MG tablet Take 1-2 tablets by mouth every 6 (six) hours as needed for severe pain. 50 tablet 0  . pentoxifylline (TRENTAL) 400 MG CR tablet Take 1 tablet (400 mg total) by mouth 3 (three) times daily with meals. 60 tablet 0  . senna-docusate (SENOKOT-S) 8.6-50 MG tablet Take 2 tablets by mouth 2 (two) times daily.     . traMADol (ULTRAM) 50 MG tablet TAKE 1 TABLET (50 mg) BY MOUTH EVERY 12 HOURS AS NEEDED for pain 30 tablet 0   No current facility-administered medications for this  visit.     Allergies  Allergen Reactions  . Penicillins Other (See Comments)    Received ancef 3gms with no obvious reaction. 08/08/15 Has patient had a PCN reaction causing immediate rash, facial/tongue/throat swelling, SOB or lightheadedness with hypotension: Unknown, childhood reaction Has patient had a PCN reaction causing severe rash involving mucus membranes or skin necrosis: No Has patient had a PCN reaction that required hospitalization No Has patient had a PCN reaction occurring within the last 10 years: No If all of the above answers are "NO", then may proceed with Ce    Family History  Problem Relation Age of Onset  . Coronary artery disease Mother   . Hyperlipidemia Mother   . Hypertension Mother   . Arthritis Mother   . Coronary artery disease Father   . Heart disease Paternal Grandmother   . Alzheimer's disease Paternal Grandfather     Social History   Social History  . Marital status: Married    Spouse name: N/A  . Number of children: N/A  . 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   Social History Main Topics  . Smoking status: Never Smoker  . Smokeless tobacco: Never Used  . Alcohol use Yes     Comment: occassional  . Drug use: No  . Sexual activity: Yes   Other Topics Concern  . Not on file   Social History Narrative  . No narrative on file     Const: Denies fevers or chills. MSK: Pt reports right thoracic back pain.  Denies decreased ROM. Neuro: Denies numbness or tingling.  No other specific complaints in a complete review of systems (except as listed in HPI above).       Objective:   Physical Exam  BP 122/76   Pulse 76   Temp 97.9 F (36.6 C) (Oral)   Wt 270 lb (122.5 kg)   SpO2 97%   BMI 39.87 kg/m   General: Well-appearing, in no acute distress Pulm: Clear to auscultation bilaterally.  No wheezes, rales, or rhonchi. CV: Regular rate and rhythm.  No murmurs, rubs, or gallops. MSK: Thoracic spine  and paraspinals tender to palpation.  Full AROM of cervical spine, right thoracic pain with cervical rotation and lateral flexion. Strength 5/5 cervical spine.  Full AROM of bilateral shoulders, right thoracic pain with all motions.  Strength 5/5.  Full AROM of lumbar spine, right thoracic pain with all motions. Neuro: Sensation to light touch intact  bilaterally upper extremities.  Grip strength 5/5.      Assessment & Plan:   Right thoracic back pain:  ER notes and imaging reviewed Likely muscular Schedule appointment with Chiropractor Continue Flexeril, stop Valium  Call if no improvement in symptoms, will MRI thoracic spine Mashell Sieben, NP

## 2015-08-24 NOTE — Patient Instructions (Signed)

## 2015-09-19 ENCOUNTER — Other Ambulatory Visit: Payer: Self-pay

## 2015-09-19 MED ORDER — TRAMADOL HCL 50 MG PO TABS: ORAL_TABLET | ORAL | 0 refills | Status: DC

## 2015-09-19 NOTE — Telephone Encounter (Signed)
Rx called in to pharmacy. 

## 2015-09-19 NOTE — Telephone Encounter (Signed)
Pt request refill tramadol for arthritis pain to [facility] Last refilled # 30 on 08/08/15; last seen 08/24/15.

## 2015-09-19 NOTE — Telephone Encounter (Signed)
Ok to phone in [facility] 

## 2015-10-02 ENCOUNTER — Encounter: Payer: Self-pay | Admitting: Cardiovascular Disease

## 2015-10-02 ENCOUNTER — Ambulatory Visit (INDEPENDENT_AMBULATORY_CARE_PROVIDER_SITE_OTHER): Payer: Managed Care, Other (non HMO) | Admitting: Cardiovascular Disease

## 2015-10-02 VITALS — BP 112/78 | HR 67 | Ht 69.0 in | Wt 268.8 lb

## 2015-10-02 DIAGNOSIS — I4891 Unspecified atrial fibrillation: Secondary | ICD-10-CM

## 2015-10-02 DIAGNOSIS — I10 Essential (primary) hypertension: Secondary | ICD-10-CM

## 2015-10-02 NOTE — Progress Notes (Signed)
Cardiology Office Note   Date:  10/02/2015   ID:  Richard [name], DOB Jun 25, 1960, MRN [id number]  PCP:  Webb Silversmith, NP  Cardiologist:   Kathlyn Sacramento, MD   Chief Complaint  Patient presents with  . Other    3 month follow up. Meds reviewed by the patient verbally. "doing well."       History of Present Illness: Richard [name] is a 55 y.o. male who presents for A follow-up visit regarding atrial fibrillation in the setting of massive pulmonary embolism. The patient has history of normal cardiac catheterization in 2015 after an abnormal stress test, hypertension, obesity, osteoarthritis status post bilateral knee surgeries. The patient underwent right partial knee arthroplasty in February 2016 followed by left TKA in November 2016. His mobility was limited after that and he had the flu in February. In early March, he presented with bilateral pulmonary embolism. Lower extremity ultrasound showed left lower extremity DVT. Echo showed normal LV systolic function with mildly dilated right ventricle. He was treated with short-term amiodarone for his atrial fibrillation and underwent catheter-based intervention for his pulmonary embolism as well as IVC filter placement.  During last visit, I discontinued amiodarone. No recurrent arrhythmia since then. He denies any chest pain or palpitations. He has mild exertional dyspnea with no recent worsening. Unfortunately, his mobility is still very limited due to inability to extend his left leg completely. He has significant swelling in that area and he still relies on the wheelchair for the most of the time.   Past Medical History:  Diagnosis Date  . Arthritis   . Arthrofibrosis of total knee arthroplasty ([place]) 05/30/2015  . Bilateral pulmonary embolism ([place])    a. 03/2015 CTA: acute bilat PE.  Marland Kitchen Chest pain    a. 2015 Abnl MV;  b. 2015 Cath: nl cors.  . Chronic diastolic CHF (congestive heart failure) ([place])    a. 03/2015 Echo: EF 55-65%,  poor windows, mildly dil RV.  Marland Kitchen DVT (deep venous thrombosis) ([place])    a. 03/2015 U/S: occlusive DVT w/in the distal aspect of the Left fem vein through the L popliteal vein.  Marland Kitchen Dysrhythmia    PAF  . GERD (gastroesophageal reflux disease)   . Hypertension   . Hypertensive heart disease   . Obesity   . OSA (obstructive sleep apnea)    uses CPAP nightly  . Primary localized osteoarthritis of left knee    a. 11/2014 s/p L TKA.  . Primary localized osteoarthritis of right knee    a. 02/2014 s/p R Partial Knee arthroplasty.  . Stiffness of left knee    a. 12/2014 s/p manipulation under anesthesia.  . Urine incontinence     Past Surgical History:  Procedure Laterality Date  . APPENDECTOMY    . CARDIAC CATHETERIZATION    . EXAM UNDER ANESTHESIA WITH MANIPULATION OF KNEE Left 05/30/2015   Procedure: EXAM UNDER ANESTHESIA WITH MANIPULATION OF LEFT KNEE;  Surgeon: Marchia Bond, MD;  Location: [facility];  Service: Orthopedics;  Laterality: Left;  . IVC FILTER PLACEMENT ([facility] HX)  04/13/15  . JOINT REPLACEMENT    . KNEE ARTHROSCOPY Left 08/08/2015   Procedure: LEFT KNEE MANIPULATION WITH ARTHROSCOPIC LYSIS OF ADHESIONS AND ASPIRATION;  Surgeon: Marchia Bond, MD;  Location: [facility];  Service: Orthopedics;  Laterality: Left;  . KNEE CLOSED REDUCTION Left 01/20/2015   Procedure: LEFT KNEE MANIPULATION;  Surgeon: Marchia Bond, MD;  Location: [facility];  Service: Orthopedics;  Laterality: Left;  .  LEFT HEART CATHETERIZATION WITH CORONARY ANGIOGRAM N/A 01/12/2014   Procedure: LEFT HEART CATHETERIZATION WITH CORONARY ANGIOGRAM;  Surgeon: Jettie Booze, MD;  Location[facility] C[facility] LAB;  Service: Cardiovascular;  Laterality: N/A;  . ORTHOPEDIC SURGERY Left    arthroscopy x3  . PARTIAL KNEE ARTHROPLASTY Right 02/25/2014   Procedure: RIGHT KNEE ARTHROPLASTY CONDYLE AND PLATEAU MEDIAL COMPARTMENT ;  Surgeon: Johnny Bridge, MD;  Location: [place];  Service: Orthopedics;   Laterality: Right;  . PERIPHERAL VASCULAR CATHETERIZATION N/A 03/29/2015   Procedure: IVC Filter Insertion, and possible thrombectomy;  Surgeon: Katha Cabal, MD;  Location: [place] CV LAB;  Service: Cardiovascular;  Laterality: N/A;  . TOTAL KNEE ARTHROPLASTY  12/06/2014   Procedure: TOTAL KNEE ARTHROPLASTY;  Surgeon: Marchia Bond, MD;  Location: [place] [facility];  Service: Orthopedics;;     Current Outpatient Prescriptions  Medication Sig Dispense Refill  . apixaban (ELIQUIS) 5 MG TABS tablet Take 1 tablet (5 mg total) by mouth 2 (two) times daily. 60 tablet 6  . baclofen (LIORESAL) 10 MG tablet Take 1 tablet (10 mg total) by mouth 3 (three) times daily as needed for muscle spasms. 30 each 0  . cyclobenzaprine (FLEXERIL) 10 MG tablet Take 1 tablet (10 mg total) by mouth 2 (two) times daily as needed for muscle spasms. 20 tablet 0  . diazepam (VALIUM) 2 MG tablet Take 1 tablet (2 mg total) by mouth every 8 (eight) hours as needed for anxiety. 6 tablet 0  . esomeprazole (NEXIUM) 40 MG capsule Take 40 mg by mouth daily as needed (for acid reflux).     . furosemide (LASIX) 40 MG tablet TAKE 1 TABLET (40 MG TOTAL) BY MOUTH DAILY. 30 tablet 5  . metoprolol succinate (TOPROL-XL) 25 MG 24 hr tablet Take 1 tablet (25 mg total) by mouth 2 (two) times daily. Take with or immediately following a meal. 90 tablet 3  . ondansetron (ZOFRAN) 4 MG tablet Take 1 tablet (4 mg total) by mouth every 8 (eight) hours as needed for nausea or vomiting. 30 tablet 0  . oxycodone (OXY-IR) 5 MG capsule Take 5 mg by mouth every 6 (six) hours as needed for pain.     Marland Kitchen oxyCODONE-acetaminophen (ROXICET) 5-325 MG tablet Take 1-2 tablets by mouth every 6 (six) hours as needed for severe pain. 50 tablet 0  . pentoxifylline (TRENTAL) 400 MG CR tablet Take 1 tablet (400 mg total) by mouth 3 (three) times daily with meals. 60 tablet 0  . senna-docusate (SENOKOT-S) 8.6-50 MG tablet Take 2 tablets by mouth 2 (two) times daily.     .  traMADol (ULTRAM) 50 MG tablet TAKE 1 TABLET (50 mg) BY MOUTH EVERY 12 HOURS AS NEEDED for pain 30 tablet 0   No current facility-administered medications for this visit.     Allergies:   Penicillins    Social History:  The patient  reports that he has never smoked. He has never used smokeless tobacco. He reports that he drinks alcohol. He reports that he does not use drugs.   Family History:  The patient's family history includes Alzheimer's disease in his paternal grandfather; Arthritis in his mother; Coronary artery disease in his father and mother; Heart disease in his paternal grandmother; Hyperlipidemia in his mother; Hypertension in his mother.    ROS:  Please see the history of present illness.   Otherwise, review of systems are positive for none.   All other systems are reviewed and negative.  PHYSICAL EXAM: VS:  BP 112/78 (BP Location: Left Arm, Patient Position: Sitting, Cuff Size: Normal)   Pulse 67   Ht 5\' 9"  (1.753 m)   Wt 268 lb 12 oz (121.9 kg)   BMI 39.69 kg/m  , BMI Body mass index is 39.69 kg/m. GEN: Well nourished, well developed, in no acute distress HEENT: normal Neck: no JVD, carotid bruits, or masses Cardiac: RRR; no murmurs, rubs, or gallops,no edema  Respiratory:  clear to auscultation bilaterally, normal work of breathing GI: soft, nontender, nondistended, + BS MS: no deformity or atrophy Skin: warm and dry, no rash Neuro:  Strength and sensation are intact Psych: euthymic mood, full affect   EKG:  EKG is ordered today. The ekg ordered today demonstrates normal sinus rhythm with left anterior fascicular block.   Recent Labs: 03/29/2015: Magnesium 2.0 03/30/2015: TSH 1.741 06/22/2015: ALT 16 08/19/2015: BUN 24; Creatinine, Ser 1.21; Hemoglobin 12.8; Platelets 254; Potassium 4.3; Sodium 139    Lipid Panel    Component Value Date/Time   CHOL 124 03/29/2015 0434   TRIG 71 03/29/2015 0434   HDL 30 (L) 03/29/2015 0434   CHOLHDL 4.1 03/29/2015 0434     VLDL 14 03/29/2015 0434   LDLCALC 80 03/29/2015 0434      Wt Readings from Last 3 Encounters:  10/02/15 268 lb 12 oz (121.9 kg)  08/24/15 270 lb (122.5 kg)  08/08/15 271 lb 9.6 oz (123.2 kg)       ASSESSMENT AND PLAN:  1.  Paroxysmal atrial fibrillation: This was in the setting of massive pulmonary embolism.   No evidence of arrhythmia after stopping amiodarone. He is currently on metoprolol.  2. Pulmonary embolism: Due to left lower extremity DVT after left knee replacement. Given the burden of his pulmonary embolism and the fact that his mobility is still very limited, recommend continuing anticoagulation. Most likely, he will require lifelong anticoagulation. If his mobility improves to normal, hypercoagulable workup can be considered to guide the length of anticoagulation.  3. Left leg swelling: Likely related to DVT and previous left knee replacement. I advised him to use furosemide only as needed and not on a regular basis.    Disposition:   FU with me in 6 months  Signed,  Kathlyn Sacramento, MD  10/02/2015 1:12 PM    [place]

## 2015-10-02 NOTE — Patient Instructions (Signed)
Medication Instructions: Continue same medications. Use Furosemide as needed only.   Labwork: None.   Procedures/Testing: None.   Follow-Up: 6 months with Dr. Fletcher Anon.   Any Additional Special Instructions Will Be Listed Below (If Applicable).     If you need a refill on your cardiac medications before your next appointment, please call your pharmacy.

## 2015-10-06 ENCOUNTER — Ambulatory Visit: Payer: Managed Care, Other (non HMO) | Admitting: Internal Medicine

## 2015-10-09 ENCOUNTER — Telehealth: Payer: Self-pay

## 2015-10-09 NOTE — Telephone Encounter (Signed)
Pt left v/m; pt has had knee surgery  And Dr Mardelle Matte is not getting good results from leg; pt is in a lot of pain and Dr Mardelle Matte said there was nothing else he could do. Pt wants referral to [facility] or [facility].pt request cb.

## 2015-10-10 ENCOUNTER — Ambulatory Visit (INDEPENDENT_AMBULATORY_CARE_PROVIDER_SITE_OTHER): Payer: Managed Care, Other (non HMO) | Admitting: Internal Medicine

## 2015-10-10 ENCOUNTER — Other Ambulatory Visit: Payer: Self-pay | Admitting: Internal Medicine

## 2015-10-10 ENCOUNTER — Ambulatory Visit (INDEPENDENT_AMBULATORY_CARE_PROVIDER_SITE_OTHER)
Admission: RE | Admit: 2015-10-10 | Discharge: 2015-10-10 | Disposition: A | Discharge status: Home / Self Care | Payer: Managed Care, Other (non HMO) | Source: Ambulatory Visit | Attending: Internal Medicine | Admitting: Internal Medicine

## 2015-10-10 ENCOUNTER — Encounter: Payer: Self-pay | Admitting: Internal Medicine

## 2015-10-10 VITALS — BP 136/78 | HR 67 | Temp 97.9°F | Wt 268.8 lb

## 2015-10-10 DIAGNOSIS — M25552 Pain in left hip: Secondary | ICD-10-CM | POA: Diagnosis not present

## 2015-10-10 DIAGNOSIS — T84093D Other mechanical complication of internal left knee prosthesis, subsequent encounter: Secondary | ICD-10-CM | POA: Diagnosis not present

## 2015-10-10 DIAGNOSIS — M25551 Pain in right hip: Secondary | ICD-10-CM | POA: Diagnosis not present

## 2015-10-10 DIAGNOSIS — M25562 Pain in left knee: Secondary | ICD-10-CM | POA: Diagnosis not present

## 2015-10-10 NOTE — Progress Notes (Signed)
Subjective:    Patient ID: Richard [name], male    DOB: 12/02/1960, 55 y.o.   MRN: [id number]  HPI  Pt presents to the clinic today to discuss left knee and bilateral hip pain. He reports he has had left knee pain s/p his left knee replacement 11/2014. He reports the TKR failed. He has had 3 mainpulations under anesthesia and 1 clean out by Dr. Mardelle Matte. He has been undergoing PT but reports nothing is helping. He has a brace at home that he wears for about 30 minutes 4 x a day that will straighten his leg temporarily, but as so as he takes it off, the knee flexes back up. He describes the pain as a constant ache with sharp stabbing pains with any movement. He can walk ver yshort distances with a crutch. He does have some numbness and tingling in his left lower leg. The hip pain started about 2 months, the left worse than the right. He describes the hip pain as burning. The hip pain is worse anytime he lays down, especially on his sides. He is taking the Oxycodone 5 mg and Tramadol 50 mg as needed for pain. He takes the Baclofen as needed for muscle spasms in his left leg. He reports Dr. Mardelle Matte advised him there was nothing else he could do for him. He referred him to [facility] for further evaluation. Mr. Joshawn called yesterday requesting a referral to [facility] or [facility] orthopedics.   Review of Systems      Past Medical History:  Diagnosis Date  . Arthritis   . Arthrofibrosis of total knee arthroplasty ([place]) 05/30/2015  . Bilateral pulmonary embolism ([place])    a. 03/2015 CTA: acute bilat PE.  Marland Kitchen Chest pain    a. 2015 Abnl MV;  b. 2015 Cath: nl cors.  . Chronic diastolic CHF (congestive heart failure) ([place])    a. 03/2015 Echo: EF 55-65%, poor windows, mildly dil RV.  Marland Kitchen DVT (deep venous thrombosis) ([place])    a. 03/2015 U/S: occlusive DVT w/in the distal aspect of the Left fem vein through the L popliteal vein.  Marland Kitchen Dysrhythmia    PAF  . GERD (gastroesophageal reflux disease)   . Hypertension    . Hypertensive heart disease   . Obesity   . OSA (obstructive sleep apnea)    uses CPAP nightly  . Primary localized osteoarthritis of left knee    a. 11/2014 s/p L TKA.  . Primary localized osteoarthritis of right knee    a. 02/2014 s/p R Partial Knee arthroplasty.  . Stiffness of left knee    a. 12/2014 s/p manipulation under anesthesia.  . Urine incontinence     Current Outpatient Prescriptions  Medication Sig Dispense Refill  . apixaban (ELIQUIS) 5 MG TABS tablet Take 1 tablet (5 mg total) by mouth 2 (two) times daily. 60 tablet 6  . baclofen (LIORESAL) 10 MG tablet Take 1 tablet (10 mg total) by mouth 3 (three) times daily as needed for muscle spasms. 30 each 0  . cyclobenzaprine (FLEXERIL) 10 MG tablet Take 1 tablet (10 mg total) by mouth 2 (two) times daily as needed for muscle spasms. 20 tablet 0  . diazepam (VALIUM) 2 MG tablet Take 1 tablet (2 mg total) by mouth every 8 (eight) hours as needed for anxiety. 6 tablet 0  . esomeprazole (NEXIUM) 40 MG capsule Take 40 mg by mouth daily as needed (for acid reflux).     . furosemide (LASIX) 40 MG tablet  TAKE 1 TABLET (40 MG TOTAL) BY MOUTH DAILY. 30 tablet 5  . metoprolol succinate (TOPROL-XL) 25 MG 24 hr tablet Take 1 tablet (25 mg total) by mouth 2 (two) times daily. Take with or immediately following a meal. 90 tablet 3  . ondansetron (ZOFRAN) 4 MG tablet Take 1 tablet (4 mg total) by mouth every 8 (eight) hours as needed for nausea or vomiting. 30 tablet 0  . oxycodone (OXY-IR) 5 MG capsule Take 5 mg by mouth every 6 (six) hours as needed for pain.     Marland Kitchen oxyCODONE-acetaminophen (ROXICET) 5-325 MG tablet Take 1-2 tablets by mouth every 6 (six) hours as needed for severe pain. 50 tablet 0  . pentoxifylline (TRENTAL) 400 MG CR tablet Take 1 tablet (400 mg total) by mouth 3 (three) times daily with meals. 60 tablet 0  . senna-docusate (SENOKOT-S) 8.6-50 MG tablet Take 2 tablets by mouth 2 (two) times daily.     . traMADol (ULTRAM) 50  MG tablet TAKE 1 TABLET (50 mg) BY MOUTH EVERY 12 HOURS AS NEEDED for pain 30 tablet 0   No current facility-administered medications for this visit.     Allergies  Allergen Reactions  . Penicillins Other (See Comments)    Received ancef 3gms with no obvious reaction. 08/08/15 Has patient had a PCN reaction causing immediate rash, facial/tongue/throat swelling, SOB or lightheadedness with hypotension: Unknown, childhood reaction Has patient had a PCN reaction causing severe rash involving mucus membranes or skin necrosis: No Has patient had a PCN reaction that required hospitalization No Has patient had a PCN reaction occurring within the last 10 years: No If all of the above answers are "NO", then may proceed with Ce    Family History  Problem Relation Age of Onset  . Coronary artery disease Mother   . Hyperlipidemia Mother   . Hypertension Mother   . Arthritis Mother   . Coronary artery disease Father   . Heart disease Paternal Grandmother   . Alzheimer's disease Paternal Grandfather     Social History   Social History  . Marital status: Married    Spouse name: N/A  . Number of children: N/A  . 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   Social History Main Topics  . Smoking status: Never Smoker  . Smokeless tobacco: Never Used  . Alcohol use Yes     Comment: occassional  . Drug use: No  . Sexual activity: Yes   Other Topics Concern  . Not on file   Social History Narrative  . No narrative on file     Constitutional: Denies fever, malaise, fatigue, headache or abrupt weight changes.  Musculoskeletal: Pt report left knee and bilateral hip pain and swelling. Denies decrease in range of motion, muscle pain Neurological: Pt reports numbness and tingling in left leg and difficulty with balance and coordination. Denies dizziness.    No other specific complaints in a complete review of systems (except as listed in HPI above).  Objective:    Physical Exam  BP 136/78   Pulse 67   Temp 97.9 F (36.6 C) (Oral)   Wt 268 lb 12 oz (121.9 kg)   SpO2 97%   BMI 39.69 kg/m  Wt Readings from Last 3 Encounters:  10/10/15 268 lb 12 oz (121.9 kg)  10/02/15 268 lb 12 oz (121.9 kg)  08/24/15 270 lb (122.5 kg)    General: Appears his stated age, obese in NAD. Musculoskeletal: Unable  to flex fully or extend the left knee. Joint extremely enlarged. Pain with palpation of the left knee. Unable to perform ROM of the left hip secondary to severe knee pain. Normal flexion and extension of the right hip. Pain with extension. Normal internal and external rotation of the right hip. Pain with palpation over the right trochanteric bursa.  Neurological: Alert and oriented. Sensation intact to BLE.   BMET    Component Value Date/Time   NA 139 08/19/2015 0924   K 4.3 08/19/2015 0924   CL 107 08/19/2015 0924   CO2 27 08/19/2015 0924   GLUCOSE 94 08/19/2015 0924   BUN 24 (H) 08/19/2015 0924   CREATININE 1.21 08/19/2015 0924   CALCIUM 9.0 08/19/2015 0924   GFRNONAA >60 08/19/2015 0924   GFRAA >60 08/19/2015 0924    Lipid Panel     Component Value Date/Time   CHOL 124 03/29/2015 0434   TRIG 71 03/29/2015 0434   HDL 30 (L) 03/29/2015 0434   CHOLHDL 4.1 03/29/2015 0434   VLDL 14 03/29/2015 0434   LDLCALC 80 03/29/2015 0434    CBC    Component Value Date/Time   WBC 9.8 08/19/2015 0924   RBC 4.99 08/19/2015 0924   HGB 12.8 (L) 08/19/2015 0924   HCT 42.0 08/19/2015 0924   PLT 254 08/19/2015 0924   MCV 84.2 08/19/2015 0924   MCH 25.7 (L) 08/19/2015 0924   MCHC 30.5 08/19/2015 0924   RDW 16.1 (H) 08/19/2015 0924   LYMPHSABS 2.4 08/19/2015 0924   MONOABS 0.9 08/19/2015 0924   EOSABS 0.2 08/19/2015 0924   BASOSABS 0.0 08/19/2015 0924    Hgb A1C Lab Results  Component Value Date   HGBA1C 5.5 06/22/2015            Assessment & Plan:   Left knee pain s/p failed TKR;  Encouraged him to wear the brace as  instructed Ice and Ibuprofen as needed Continue Tramadol, Oxycodone and Baclofen as prescribed He will follow up with [facility] Referral has been placed to [facility] per pt request  Bilateral hip pain:  Xray bilateral hips today Ibuprofen as needed Encouraged weight loss  Will follow up after xray, RTC 06/2016 for your annual exam Webb Silversmith, NP

## 2015-10-10 NOTE — Progress Notes (Signed)
Pre visit review using our clinic review tool, if applicable. No additional management support is needed unless otherwise documented below in the visit note. 

## 2015-10-10 NOTE — Telephone Encounter (Signed)
Referral placed.

## 2015-10-10 NOTE — Patient Instructions (Signed)

## 2015-10-23 ENCOUNTER — Other Ambulatory Visit: Payer: Self-pay

## 2015-10-23 MED ORDER — TRAMADOL HCL 50 MG PO TABS: ORAL_TABLET | ORAL | 0 refills | Status: DC

## 2015-10-23 NOTE — Telephone Encounter (Signed)
Last filled 09/19/15, last OV 10/10/15. Ok to refill?

## 2015-10-23 NOTE — Telephone Encounter (Signed)
Ok to phone in Tramadol 

## 2015-10-23 NOTE — Telephone Encounter (Signed)
Rx called in to pharmacy. 

## 2015-10-26 ENCOUNTER — Other Ambulatory Visit: Payer: Self-pay | Admitting: Orthopedic Surgery

## 2015-10-27 ENCOUNTER — Encounter (HOSPITAL_COMMUNITY): Payer: Self-pay | Admitting: *Deleted

## 2015-10-27 NOTE — Progress Notes (Signed)
Pt denies SOB and chest pain, Pt is under the care of Dr. Fletcher Anon, Cardiology. Pt stated that he was instructed to take his klast dose of Eliquis this morning. Pt made aware to stop taking Aspirin, vitamins, fish oil and herbal medications. Do not take any NSAIDs ie: Ibuprofen, Advil, Naproxen, BC and Goody Powder or any medication containing Aspirin. Pt verbalized understanding of all pre-op instructions.

## 2015-10-27 NOTE — H&P (Signed)
TOTAL KNEE REVISION ADMISSION H&P  Patient is being admitted for left revision total knee arthroplasty.  Subjective:  Chief Complaint:left knee pain.  HPI: Richard [name], 55 y.o. male, has a history of pain and functional disability in the left knee(s) due to infection and patient has failed non-surgical conservative treatments for greater than 12 weeks to include flexibility and strengthening excercises, use of assistive devices and activity modification. The indications for the revision of the total knee arthroplasty are history of total knee infection. Onset of symptoms was gradual starting 1 years ago with gradually worsening course since that time.  Prior procedures on the left knee(s) include arthroplasty.  Patient currently rates pain in the left knee(s) at 10 out of 10 with activity. There is night pain, worsening of pain with activity and weight bearing, pain that interferes with activities of daily living and pain with passive range of motion.  Patient has evidence of infection by imaging studies. This condition presents safety issues increasing the risk of falls.    There is no current active infection.  Patient Active Problem List   Diagnosis Date Noted  . Arthrofibrosis of total knee arthroplasty ([place]) 05/30/2015  . PAF (paroxysmal atrial fibrillation) ([place]) 03/30/2015  . Peroneal DVT (deep venous thrombosis) ([place])   . Pulmonary embolism ([place]) 03/27/2015  . Hypertension 08/17/2011  . LVH (left ventricular hypertrophy) 08/17/2011  . GERD (gastroesophageal reflux disease) 08/17/2011  . Morbid obesity ([place]) 08/16/2011  . OSA (obstructive sleep apnea) 08/16/2011   Past Medical History:  Diagnosis Date  . Arthritis   . Arthrofibrosis of total knee arthroplasty ([place]) 05/30/2015  . Bilateral pulmonary embolism ([place])    a. 03/2015 CTA: acute bilat PE.  Marland Kitchen Chest pain    a. 2015 Abnl MV;  b. 2015 Cath: nl cors.  . Chronic diastolic CHF (congestive heart failure) ([place])    a. 03/2015  Echo: EF 55-65%, poor windows, mildly dil RV.  Marland Kitchen DVT (deep venous thrombosis) ([place])    a. 03/2015 U/S: occlusive DVT w/in the distal aspect of the Left fem vein through the L popliteal vein.  Marland Kitchen Dysrhythmia    PAF  . GERD (gastroesophageal reflux disease)   . Hypertension   . Hypertensive heart disease   . Obesity   . OSA (obstructive sleep apnea)    uses CPAP nightly  . Primary localized osteoarthritis of left knee    a. 11/2014 s/p L TKA.  . Primary localized osteoarthritis of right knee    a. 02/2014 s/p R Partial Knee arthroplasty.  . Stiffness of left knee    a. 12/2014 s/p manipulation under anesthesia.  . Urine incontinence     Past Surgical History:  Procedure Laterality Date  . APPENDECTOMY    . CARDIAC CATHETERIZATION    . EXAM UNDER ANESTHESIA WITH MANIPULATION OF KNEE Left 05/30/2015   Procedure: EXAM UNDER ANESTHESIA WITH MANIPULATION OF LEFT KNEE;  Surgeon: Marchia Bond, MD;  Location: [place];  Service: Orthopedics;  Laterality: Left;  . IVC FILTER PLACEMENT ([facility] HX)  04/13/15  . JOINT REPLACEMENT    . KNEE ARTHROSCOPY Left 08/08/2015   Procedure: LEFT KNEE MANIPULATION WITH ARTHROSCOPIC LYSIS OF ADHESIONS AND ASPIRATION;  Surgeon: Marchia Bond, MD;  Location: [place];  Service: Orthopedics;  Laterality: Left;  . KNEE CLOSED REDUCTION Left 01/20/2015   Procedure: LEFT KNEE MANIPULATION;  Surgeon: Marchia Bond, MD;  Location: [place];  Service: Orthopedics;  Laterality: Left;  . LEFT HEART CATHETERIZATION WITH CORONARY ANGIOGRAM  N/A 01/12/2014   Procedure: LEFT HEART CATHETERIZATION WITH CORONARY ANGIOGRAM;  Surgeon: Jettie Booze, MD;  Location[facility] C[facility];  Service: Cardiovascular;  Laterality: N/A;  . ORTHOPEDIC SURGERY Left    arthroscopy x3  . PARTIAL KNEE ARTHROPLASTY Right 02/25/2014   Procedure: RIGHT KNEE ARTHROPLASTY CONDYLE AND PLATEAU MEDIAL COMPARTMENT ;  Surgeon: Johnny Bridge, MD;  Location: [place];  Service:  Orthopedics;  Laterality: Right;  . PERIPHERAL VASCULAR CATHETERIZATION N/A 03/29/2015   Procedure: IVC Filter Insertion, and possible thrombectomy;  Surgeon: Katha Cabal, MD;  Location: [place] CV LAB;  Service: Cardiovascular;  Laterality: N/A;  . TOTAL KNEE ARTHROPLASTY  12/06/2014   Procedure: TOTAL KNEE ARTHROPLASTY;  Surgeon: Marchia Bond, MD;  Location: [place];  Service: Orthopedics;;    No prescriptions prior to admission.   Allergies  Allergen Reactions  . Penicillins Other (See Comments)    Received ancef 3gms with no obvious reaction. 08/08/15 Has patient had a PCN reaction causing immediate rash, facial/tongue/throat swelling, SOB or lightheadedness with hypotension: Unknown, childhood reaction Has patient had a PCN reaction causing severe rash involving mucus membranes or skin necrosis: No Has patient had a PCN reaction that required hospitalization No Has patient had a PCN reaction occurring within the last 10 years: No If all of the above answers are "NO", then may proceed with Ce    Social History  Substance Use Topics  . Smoking status: Never Smoker  . Smokeless tobacco: Never Used  . Alcohol use Yes     Comment: occassional    Family History  Problem Relation Age of Onset  . Coronary artery disease Mother   . Hyperlipidemia Mother   . Hypertension Mother   . Arthritis Mother   . Coronary artery disease Father   . Heart disease Paternal Grandmother   . Alzheimer's disease Paternal Grandfather       Review of Systems  Constitutional: Positive for chills, fever and malaise/fatigue.  HENT: Negative.   Eyes: Negative.   Respiratory: Negative.   Cardiovascular:       Htn  Gastrointestinal: Negative.   Genitourinary: Negative.        Weak stream  Musculoskeletal: Positive for joint pain and myalgias.  Skin: Negative.   Neurological: Negative.   Endo/Heme/Allergies: Negative.   Psychiatric/Behavioral: Negative.      Objective:  Physical Exam   Constitutional: He is oriented to person, place, and time. He appears well-developed and well-nourished.  HENT:  Head: Normocephalic and atraumatic.  Eyes: Pupils are equal, round, and reactive to light.  Neck: Normal range of motion. Neck supple.  Cardiovascular: Intact distal pulses.   Respiratory: Effort normal.  Musculoskeletal: He exhibits tenderness.  The left knee is 2-3+ swollen with brawny edema.  I cannot discern any intra-articular collection of fluid.  Collateral ligaments are stable.  Range of motion today is from about 30-80.  The skin over the total knee is 2-3+ tender to palpation.  His calf is one plus swollen but not particularly tender to palpation.  Neurological: He is alert and oriented to person, place, and time.  Skin: Skin is warm and dry.  Psychiatric: He has a normal mood and affect. His behavior is normal. Judgment and thought content normal.    Vital signs in last 24 hours:    Labs:  Estimated body mass index is 39.69 kg/m as calculated from the following:   Height as of 10/02/15: 5\' 9"  (1.753 m).   Weight as of  10/10/15: 121.9 kg (268 lb 12 oz).  Imaging Review Plain radiographs demonstrate AP, lateral and sunrise x-rays show no evidence of loosening of the prostheses.  The patellar component appears to be rotated about 70 and is almost perpendicular to the trochlea.    Assessment/Plan:  End stage arthritis, left knee(s) with failed previous arthroplasty.   The patient history, physical examination, clinical judgment of the provider and imaging studies are consistent with end stage degenerative joint disease of the left knee(s), previous total knee arthroplasty. Revision total knee arthroplasty is deemed medically necessary. The treatment options including medical management, injection therapy, arthroscopy and revision arthroplasty were discussed at length. The risks and benefits of revision total knee arthroplasty were presented and reviewed. The risks  due to aseptic loosening, infection, stiffness, patella tracking problems, thromboembolic complications and other imponderables were discussed. The patient acknowledged the explanation, agreed to proceed with the plan and consent was signed. Patient is being admitted for inpatient treatment for surgery, pain control, PT, OT, prophylactic antibiotics, VTE prophylaxis, progressive ambulation and ADL's and discharge planning.The patient is planning to be discharged home with home health services

## 2015-10-29 DIAGNOSIS — T8454XA Infection and inflammatory reaction due to internal left knee prosthesis, initial encounter: Secondary | ICD-10-CM

## 2015-10-30 ENCOUNTER — Inpatient Hospital Stay (HOSPITAL_COMMUNITY)
Admission: RE | Admit: 2015-10-30 | Discharge: 2015-11-03 | DRG: 464 | Disposition: A | Discharge status: Home Health | Payer: Managed Care, Other (non HMO) | Source: Ambulatory Visit | Attending: Orthopedic Surgery | Admitting: Orthopedic Surgery

## 2015-10-30 ENCOUNTER — Inpatient Hospital Stay (HOSPITAL_COMMUNITY): Payer: Managed Care, Other (non HMO) | Admitting: Certified Registered Nurse Anesthetist

## 2015-10-30 ENCOUNTER — Encounter (HOSPITAL_COMMUNITY)
Admission: RE | Disposition: A | Discharge status: Home Health | Payer: Self-pay | Source: Ambulatory Visit | Attending: Orthopedic Surgery

## 2015-10-30 ENCOUNTER — Inpatient Hospital Stay (HOSPITAL_COMMUNITY): Payer: Managed Care, Other (non HMO)

## 2015-10-30 ENCOUNTER — Encounter (HOSPITAL_COMMUNITY): Payer: Self-pay | Admitting: *Deleted

## 2015-10-30 DIAGNOSIS — M00062 Staphylococcal arthritis, left knee: Secondary | ICD-10-CM | POA: Diagnosis not present

## 2015-10-30 DIAGNOSIS — Z8249 Family history of ischemic heart disease and other diseases of the circulatory system: Secondary | ICD-10-CM | POA: Diagnosis not present

## 2015-10-30 DIAGNOSIS — K219 Gastro-esophageal reflux disease without esophagitis: Secondary | ICD-10-CM | POA: Diagnosis present

## 2015-10-30 DIAGNOSIS — M170 Bilateral primary osteoarthritis of knee: Secondary | ICD-10-CM | POA: Diagnosis present

## 2015-10-30 DIAGNOSIS — Z419 Encounter for procedure for purposes other than remedying health state, unspecified: Secondary | ICD-10-CM

## 2015-10-30 DIAGNOSIS — I5032 Chronic diastolic (congestive) heart failure: Secondary | ICD-10-CM | POA: Diagnosis present

## 2015-10-30 DIAGNOSIS — D62 Acute posthemorrhagic anemia: Secondary | ICD-10-CM | POA: Diagnosis not present

## 2015-10-30 DIAGNOSIS — T8454XA Infection and inflammatory reaction due to internal left knee prosthesis, initial encounter: Principal | ICD-10-CM

## 2015-10-30 DIAGNOSIS — Z820 Family history of epilepsy and other diseases of the nervous system: Secondary | ICD-10-CM | POA: Diagnosis not present

## 2015-10-30 DIAGNOSIS — I110 Hypertensive heart disease with heart failure: Secondary | ICD-10-CM | POA: Diagnosis present

## 2015-10-30 DIAGNOSIS — Z6839 Body mass index (BMI) 39.0-39.9, adult: Secondary | ICD-10-CM

## 2015-10-30 DIAGNOSIS — Z86718 Personal history of other venous thrombosis and embolism: Secondary | ICD-10-CM | POA: Diagnosis not present

## 2015-10-30 DIAGNOSIS — Z8261 Family history of arthritis: Secondary | ICD-10-CM | POA: Diagnosis not present

## 2015-10-30 DIAGNOSIS — Z01818 Encounter for other preprocedural examination: Secondary | ICD-10-CM

## 2015-10-30 DIAGNOSIS — Z96651 Presence of right artificial knee joint: Secondary | ICD-10-CM | POA: Diagnosis present

## 2015-10-30 DIAGNOSIS — Z86711 Personal history of pulmonary embolism: Secondary | ICD-10-CM | POA: Diagnosis not present

## 2015-10-30 DIAGNOSIS — T8454XD Infection and inflammatory reaction due to internal left knee prosthesis, subsequent encounter: Secondary | ICD-10-CM | POA: Diagnosis not present

## 2015-10-30 DIAGNOSIS — G4733 Obstructive sleep apnea (adult) (pediatric): Secondary | ICD-10-CM | POA: Diagnosis present

## 2015-10-30 DIAGNOSIS — R2681 Unsteadiness on feet: Secondary | ICD-10-CM

## 2015-10-30 DIAGNOSIS — I480 Paroxysmal atrial fibrillation: Secondary | ICD-10-CM | POA: Diagnosis present

## 2015-10-30 DIAGNOSIS — Z880 Allergy status to penicillin: Secondary | ICD-10-CM

## 2015-10-30 DIAGNOSIS — Y792 Prosthetic and other implants, materials and accessory orthopedic devices associated with adverse incidents: Secondary | ICD-10-CM | POA: Diagnosis present

## 2015-10-30 DIAGNOSIS — B957 Other staphylococcus as the cause of diseases classified elsewhere: Secondary | ICD-10-CM | POA: Diagnosis present

## 2015-10-30 DIAGNOSIS — M25562 Pain in left knee: Secondary | ICD-10-CM | POA: Diagnosis present

## 2015-10-30 HISTORY — PX: I & D KNEE WITH POLY EXCHANGE: SHX5024

## 2015-10-30 HISTORY — DX: Headache, unspecified: R51.9

## 2015-10-30 HISTORY — DX: Headache: R51

## 2015-10-30 LAB — CBC WITH DIFFERENTIAL/PLATELET
Basophils Absolute: 0 10*3/uL (ref 0.0–0.1)
Basophils Relative: 0 %
Eosinophils Absolute: 0.2 10*3/uL (ref 0.0–0.7)
Eosinophils Relative: 3 %
HCT: 44.8 % (ref 39.0–52.0)
Hemoglobin: 14.3 g/dL (ref 13.0–17.0)
Lymphocytes Relative: 24 %
Lymphs Abs: 2.2 10*3/uL (ref 0.7–4.0)
MCH: 26.7 pg (ref 26.0–34.0)
MCHC: 31.9 g/dL (ref 30.0–36.0)
MCV: 83.6 fL (ref 78.0–100.0)
Monocytes Absolute: 0.9 10*3/uL (ref 0.1–1.0)
Monocytes Relative: 11 %
Neutro Abs: 5.5 10*3/uL (ref 1.7–7.7)
Neutrophils Relative %: 62 %
Platelets: 264 10*3/uL (ref 150–400)
RBC: 5.36 MIL/uL (ref 4.22–5.81)
RDW: 15 % (ref 11.5–15.5)
WBC: 8.9 10*3/uL (ref 4.0–10.5)

## 2015-10-30 LAB — URINALYSIS, ROUTINE W REFLEX MICROSCOPIC
Bilirubin Urine: NEGATIVE
Glucose, UA: NEGATIVE mg/dL
Hgb urine dipstick: NEGATIVE
Ketones, ur: NEGATIVE mg/dL
Leukocytes, UA: NEGATIVE
Nitrite: NEGATIVE
Protein, ur: NEGATIVE mg/dL
Specific Gravity, Urine: 1.022 (ref 1.005–1.030)
pH: 5.5 (ref 5.0–8.0)

## 2015-10-30 LAB — BASIC METABOLIC PANEL
Anion gap: 8 (ref 5–15)
BUN: 15 mg/dL (ref 6–20)
CO2: 23 mmol/L (ref 22–32)
Calcium: 9.5 mg/dL (ref 8.9–10.3)
Chloride: 108 mmol/L (ref 101–111)
Creatinine, Ser: 1.19 mg/dL (ref 0.61–1.24)
GFR calc Af Amer: >60 mL/min (ref >60)
GFR calc non Af Amer: >60 mL/min (ref >60)
Glucose, Bld: 82 mg/dL (ref 65–99)
Potassium: 4.4 mmol/L (ref 3.5–5.1)
Sodium: 139 mmol/L (ref 135–145)

## 2015-10-30 LAB — TYPE AND SCREEN
ABO/RH(D): A POS
Antibody Screen: NEGATIVE

## 2015-10-30 LAB — PROTIME-INR
INR: 1.1
Prothrombin Time: 14.3 seconds (ref 11.4–15.2)

## 2015-10-30 LAB — ABO/RH: ABO/RH(D): A POS

## 2015-10-30 LAB — APTT: aPTT: 30 seconds (ref 24–36)

## 2015-10-30 SURGERY — IRRIGATION AND DEBRIDEMENT KNEE WITH POLY EXCHANGE
Anesthesia: General | Laterality: Left

## 2015-10-30 MED ORDER — TRANEXAMIC ACID 1000 MG/10ML IV SOLN
2000.0000 mg | Freq: Once | INTRAVENOUS | Status: DC
  Filled 2015-10-30: qty 20

## 2015-10-30 MED ORDER — METHOCARBAMOL 1000 MG/10ML IJ SOLN
500.0000 mg | Freq: Four times a day (QID) | INTRAVENOUS | Status: DC | PRN
  Filled 2015-10-30: qty 5

## 2015-10-30 MED ORDER — METOCLOPRAMIDE HCL 5 MG/ML IJ SOLN: 5.0000 mg | Freq: Three times a day (TID) | INTRAMUSCULAR | Status: DC | PRN

## 2015-10-30 MED ORDER — METOPROLOL SUCCINATE ER 25 MG PO TB24
25.0000 mg | ORAL_TABLET | Freq: Two times a day (BID) | ORAL | Status: DC
  Administered 2015-10-31 – 2015-11-03 (×7): 25 mg via ORAL
  Filled 2015-10-30 (×7): qty 1

## 2015-10-30 MED ORDER — BISACODYL 5 MG PO TBEC
5.0000 mg | DELAYED_RELEASE_TABLET | Freq: Every day | ORAL | Status: DC | PRN
  Administered 2015-11-03: 5 mg via ORAL
  Filled 2015-10-30: qty 1

## 2015-10-30 MED ORDER — KCL IN DEXTROSE-NACL 20-5-0.45 MEQ/L-%-% IV SOLN
INTRAVENOUS | Status: DC
  Administered 2015-10-30: 23:00:00 via INTRAVENOUS
  Filled 2015-10-30: qty 1000

## 2015-10-30 MED ORDER — ONDANSETRON HCL 4 MG/2ML IJ SOLN
INTRAMUSCULAR | Status: AC
  Filled 2015-10-30: qty 2

## 2015-10-30 MED ORDER — HYDROMORPHONE HCL 1 MG/ML IJ SOLN
1.0000 mg | INTRAMUSCULAR | Status: DC | PRN
Start: 2015-10-30 — End: 2015-11-03
  Administered 2015-10-30 – 2015-11-02 (×3): 1 mg via INTRAVENOUS
  Filled 2015-10-30 (×4): qty 1

## 2015-10-30 MED ORDER — METHOCARBAMOL 500 MG PO TABS
ORAL_TABLET | ORAL | Status: AC
  Filled 2015-10-30: qty 1

## 2015-10-30 MED ORDER — SUFENTANIL CITRATE 50 MCG/ML IV SOLN
INTRAVENOUS | Status: DC | PRN
  Administered 2015-10-30: 5 ug via INTRAVENOUS
  Administered 2015-10-30: 10 ug via INTRAVENOUS
  Administered 2015-10-30: 5 ug via INTRAVENOUS
  Administered 2015-10-30: 10 ug via INTRAVENOUS

## 2015-10-30 MED ORDER — SENNOSIDES-DOCUSATE SODIUM 8.6-50 MG PO TABS
1.0000 | ORAL_TABLET | Freq: Every evening | ORAL | Status: DC | PRN
  Administered 2015-11-03: 1 via ORAL
  Filled 2015-10-30: qty 1

## 2015-10-30 MED ORDER — BUPIVACAINE LIPOSOME 1.3 % IJ SUSP
INTRAMUSCULAR | Status: DC | PRN
  Administered 2015-10-30: 20 mL

## 2015-10-30 MED ORDER — BUPIVACAINE LIPOSOME 1.3 % IJ SUSP
20.0000 mL | Freq: Once | INTRAMUSCULAR | Status: DC
  Filled 2015-10-30 (×2): qty 20

## 2015-10-30 MED ORDER — PROPOFOL 10 MG/ML IV BOLUS
INTRAVENOUS | Status: AC
  Filled 2015-10-30: qty 20

## 2015-10-30 MED ORDER — DEXTROSE-NACL 5-0.45 % IV SOLN: INTRAVENOUS | Status: DC

## 2015-10-30 MED ORDER — LACTATED RINGERS IV SOLN
INTRAVENOUS | Status: DC
  Administered 2015-10-30 (×3): via INTRAVENOUS

## 2015-10-30 MED ORDER — TRANEXAMIC ACID 1000 MG/10ML IV SOLN
INTRAVENOUS | Status: DC | PRN
  Administered 2015-10-30: 2000 mg via TOPICAL

## 2015-10-30 MED ORDER — LIDOCAINE HCL (CARDIAC) 20 MG/ML IV SOLN
INTRAVENOUS | Status: DC | PRN
  Administered 2015-10-30: 100 mg via INTRAVENOUS

## 2015-10-30 MED ORDER — HYDROMORPHONE HCL 1 MG/ML IJ SOLN
INTRAMUSCULAR | Status: AC
  Administered 2015-10-30: 0.5 mg via INTRAVENOUS
  Filled 2015-10-30: qty 1

## 2015-10-30 MED ORDER — SODIUM CHLORIDE 0.9 % IJ SOLN
INTRAMUSCULAR | Status: AC
  Filled 2015-10-30: qty 10

## 2015-10-30 MED ORDER — PROPOFOL 10 MG/ML IV BOLUS
INTRAVENOUS | Status: DC | PRN
  Administered 2015-10-30: 150 mg via INTRAVENOUS

## 2015-10-30 MED ORDER — ACETAMINOPHEN 325 MG PO TABS
650.0000 mg | ORAL_TABLET | Freq: Four times a day (QID) | ORAL | Status: DC | PRN
  Administered 2015-10-31 – 2015-11-03 (×5): 650 mg via ORAL
  Filled 2015-10-30 (×5): qty 2

## 2015-10-30 MED ORDER — DOCUSATE SODIUM 100 MG PO CAPS
100.0000 mg | ORAL_CAPSULE | Freq: Two times a day (BID) | ORAL | Status: DC
  Administered 2015-10-30 – 2015-11-03 (×8): 100 mg via ORAL
  Filled 2015-10-30 (×8): qty 1

## 2015-10-30 MED ORDER — MIDAZOLAM HCL 2 MG/2ML IJ SOLN
INTRAMUSCULAR | Status: AC
  Filled 2015-10-30: qty 2

## 2015-10-30 MED ORDER — SODIUM CHLORIDE 0.9 % IR SOLN
Status: DC | PRN
  Administered 2015-10-30: 3000 mL

## 2015-10-30 MED ORDER — DIPHENHYDRAMINE HCL 12.5 MG/5ML PO ELIX: 12.5000 mg | ORAL_SOLUTION | ORAL | Status: DC | PRN

## 2015-10-30 MED ORDER — VANCOMYCIN HCL IN DEXTROSE 1-5 GM/200ML-% IV SOLN
1000.0000 mg | Freq: Two times a day (BID) | INTRAVENOUS | Status: AC
  Administered 2015-10-31: 1000 mg via INTRAVENOUS
  Filled 2015-10-30: qty 200

## 2015-10-30 MED ORDER — EPHEDRINE 5 MG/ML INJ
INTRAVENOUS | Status: AC
  Filled 2015-10-30: qty 10

## 2015-10-30 MED ORDER — ACETAMINOPHEN 650 MG RE SUPP: 650.0000 mg | Freq: Four times a day (QID) | RECTAL | Status: DC | PRN

## 2015-10-30 MED ORDER — TOBRAMYCIN SULFATE 1.2 G IJ SOLR
INTRAMUSCULAR | Status: DC | PRN
  Administered 2015-10-30 (×3): 1.2 g

## 2015-10-30 MED ORDER — OXYCODONE HCL 5 MG/5ML PO SOLN: 5.0000 mg | Freq: Once | ORAL | Status: DC | PRN

## 2015-10-30 MED ORDER — VANCOMYCIN HCL 1000 MG IV SOLR
INTRAVENOUS | Status: AC
  Filled 2015-10-30: qty 4000

## 2015-10-30 MED ORDER — METOCLOPRAMIDE HCL 5 MG PO TABS: 5.0000 mg | ORAL_TABLET | Freq: Three times a day (TID) | ORAL | Status: DC | PRN

## 2015-10-30 MED ORDER — SUFENTANIL CITRATE 50 MCG/ML IV SOLN
INTRAVENOUS | Status: AC
  Filled 2015-10-30: qty 1

## 2015-10-30 MED ORDER — CHLORHEXIDINE GLUCONATE 4 % EX LIQD: 60.0000 mL | Freq: Once | CUTANEOUS | Status: DC

## 2015-10-30 MED ORDER — SODIUM CHLORIDE 0.9 % IJ SOLN
INTRAMUSCULAR | Status: DC | PRN
  Administered 2015-10-30: 50 mL

## 2015-10-30 MED ORDER — VANCOMYCIN HCL IN DEXTROSE 1-5 GM/200ML-% IV SOLN
1000.0000 mg | INTRAVENOUS | Status: AC
  Administered 2015-10-30: 1000 mg via INTRAVENOUS
  Filled 2015-10-30: qty 200

## 2015-10-30 MED ORDER — MENTHOL 3 MG MT LOZG: 1.0000 | LOZENGE | OROMUCOSAL | Status: DC | PRN

## 2015-10-30 MED ORDER — VANCOMYCIN HCL 1000 MG IV SOLR
INTRAVENOUS | Status: DC | PRN
  Administered 2015-10-30: 1000 mg
  Administered 2015-10-30: 2000 mg
  Administered 2015-10-30: 1000 mg

## 2015-10-30 MED ORDER — BUPIVACAINE-EPINEPHRINE 0.25% -1:200000 IJ SOLN
INTRAMUSCULAR | Status: DC | PRN
  Administered 2015-10-30: 50 mL

## 2015-10-30 MED ORDER — FENTANYL CITRATE (PF) 100 MCG/2ML IJ SOLN
INTRAMUSCULAR | Status: DC | PRN
  Administered 2015-10-30 (×2): 50 ug via INTRAVENOUS

## 2015-10-30 MED ORDER — PHENYLEPHRINE 40 MCG/ML (10ML) SYRINGE FOR IV PUSH (FOR BLOOD PRESSURE SUPPORT)
PREFILLED_SYRINGE | INTRAVENOUS | Status: AC
Start: 2015-10-30 — End: 2015-10-30
  Filled 2015-10-30: qty 10

## 2015-10-30 MED ORDER — TOBRAMYCIN SULFATE 1.2 G IJ SOLR
INTRAMUSCULAR | Status: AC
  Filled 2015-10-30: qty 1.2

## 2015-10-30 MED ORDER — PHENYLEPHRINE 40 MCG/ML (10ML) SYRINGE FOR IV PUSH (FOR BLOOD PRESSURE SUPPORT)
PREFILLED_SYRINGE | INTRAVENOUS | Status: DC | PRN
  Administered 2015-10-30: 40 ug via INTRAVENOUS
  Administered 2015-10-30: 80 ug via INTRAVENOUS
  Administered 2015-10-30: 120 ug via INTRAVENOUS
  Administered 2015-10-30 (×2): 40 ug via INTRAVENOUS

## 2015-10-30 MED ORDER — 0.9 % SODIUM CHLORIDE (POUR BTL) OPTIME
TOPICAL | Status: DC | PRN
  Administered 2015-10-30: 1000 mL

## 2015-10-30 MED ORDER — ROCURONIUM BROMIDE 100 MG/10ML IV SOLN
INTRAVENOUS | Status: DC | PRN
  Administered 2015-10-30: 60 mg via INTRAVENOUS

## 2015-10-30 MED ORDER — ONDANSETRON HCL 4 MG/2ML IJ SOLN: 4.0000 mg | Freq: Four times a day (QID) | INTRAMUSCULAR | Status: DC | PRN

## 2015-10-30 MED ORDER — ONDANSETRON HCL 4 MG/2ML IJ SOLN: 4.0000 mg | Freq: Once | INTRAMUSCULAR | Status: DC | PRN

## 2015-10-30 MED ORDER — OXYCODONE HCL 5 MG PO TABS
5.0000 mg | ORAL_TABLET | ORAL | Status: DC | PRN
  Administered 2015-10-30 – 2015-11-03 (×19): 10 mg via ORAL
  Filled 2015-10-30 (×23): qty 2

## 2015-10-30 MED ORDER — METHOCARBAMOL 500 MG PO TABS
500.0000 mg | ORAL_TABLET | Freq: Four times a day (QID) | ORAL | Status: DC | PRN
  Administered 2015-10-30 – 2015-11-03 (×9): 500 mg via ORAL
  Filled 2015-10-30 (×8): qty 1

## 2015-10-30 MED ORDER — FLEET ENEMA 7-19 GM/118ML RE ENEM: 1.0000 | ENEMA | Freq: Once | RECTAL | Status: DC | PRN

## 2015-10-30 MED ORDER — LIDOCAINE 2% (20 MG/ML) 5 ML SYRINGE
INTRAMUSCULAR | Status: AC
  Filled 2015-10-30: qty 25

## 2015-10-30 MED ORDER — PHENOL 1.4 % MT LIQD: 1.0000 | OROMUCOSAL | Status: DC | PRN

## 2015-10-30 MED ORDER — ALUM & MAG HYDROXIDE-SIMETH 200-200-20 MG/5ML PO SUSP: 30.0000 mL | ORAL | Status: DC | PRN

## 2015-10-30 MED ORDER — FENTANYL CITRATE (PF) 100 MCG/2ML IJ SOLN
100.0000 ug | Freq: Once | INTRAMUSCULAR | Status: AC
  Administered 2015-10-30: 100 ug via INTRAVENOUS

## 2015-10-30 MED ORDER — PANTOPRAZOLE SODIUM 40 MG PO TBEC
80.0000 mg | DELAYED_RELEASE_TABLET | Freq: Every day | ORAL | Status: DC
  Administered 2015-10-31 – 2015-11-03 (×4): 80 mg via ORAL
  Filled 2015-10-30 (×4): qty 2

## 2015-10-30 MED ORDER — HYDROMORPHONE HCL 1 MG/ML IJ SOLN
0.2500 mg | INTRAMUSCULAR | Status: DC | PRN
  Administered 2015-10-30 (×3): 0.5 mg via INTRAVENOUS

## 2015-10-30 MED ORDER — ROCURONIUM BROMIDE 10 MG/ML (PF) SYRINGE
PREFILLED_SYRINGE | INTRAVENOUS | Status: AC
  Filled 2015-10-30: qty 20

## 2015-10-30 MED ORDER — MIDAZOLAM HCL 2 MG/2ML IJ SOLN
INTRAMUSCULAR | Status: AC
  Administered 2015-10-30: 2 mg via INTRAVENOUS
  Filled 2015-10-30: qty 2

## 2015-10-30 MED ORDER — MIDAZOLAM HCL 2 MG/2ML IJ SOLN
2.0000 mg | Freq: Once | INTRAMUSCULAR | Status: AC
  Administered 2015-10-30: 2 mg via INTRAVENOUS

## 2015-10-30 MED ORDER — FENTANYL CITRATE (PF) 100 MCG/2ML IJ SOLN
INTRAMUSCULAR | Status: AC
  Administered 2015-10-30: 100 ug via INTRAVENOUS
  Filled 2015-10-30: qty 2

## 2015-10-30 MED ORDER — ONDANSETRON HCL 4 MG PO TABS: 4.0000 mg | ORAL_TABLET | Freq: Four times a day (QID) | ORAL | Status: DC | PRN

## 2015-10-30 MED ORDER — LABETALOL HCL 5 MG/ML IV SOLN
INTRAVENOUS | Status: DC | PRN
  Administered 2015-10-30: 10 mg via INTRAVENOUS

## 2015-10-30 MED ORDER — FENTANYL CITRATE (PF) 100 MCG/2ML IJ SOLN
INTRAMUSCULAR | Status: AC
Start: 2015-10-30 — End: 2015-10-30
  Filled 2015-10-30: qty 2

## 2015-10-30 MED ORDER — APIXABAN 5 MG PO TABS
5.0000 mg | ORAL_TABLET | Freq: Two times a day (BID) | ORAL | Status: DC
  Administered 2015-10-31 – 2015-11-03 (×7): 5 mg via ORAL
  Filled 2015-10-30 (×7): qty 1

## 2015-10-30 MED ORDER — BUPIVACAINE-EPINEPHRINE 0.25% -1:200000 IJ SOLN
INTRAMUSCULAR | Status: AC
  Filled 2015-10-30: qty 1

## 2015-10-30 MED ORDER — ONDANSETRON HCL 4 MG/2ML IJ SOLN
INTRAMUSCULAR | Status: DC | PRN
  Administered 2015-10-30: 4 mg via INTRAVENOUS

## 2015-10-30 MED ORDER — OXYCODONE HCL 5 MG PO TABS: 5.0000 mg | ORAL_TABLET | Freq: Once | ORAL | Status: DC | PRN

## 2015-10-30 MED ORDER — OXYCODONE HCL 5 MG PO TABS
ORAL_TABLET | ORAL | Status: AC
  Filled 2015-10-30: qty 2

## 2015-10-30 SURGICAL SUPPLY — 81 items
BANDAGE ACE 4X5 VEL STRL LF (GAUZE/BANDAGES/DRESSINGS) ×2 IMPLANT
BANDAGE ACE 6X5 VEL STRL LF (GAUZE/BANDAGES/DRESSINGS) ×2 IMPLANT
BLADE SAGITTAL 13X1.27X60 (BLADE) ×2 IMPLANT
BLADE SAW SGTL MED 73X18.5 STR (BLADE) ×4 IMPLANT
BLADE SURG 10 STRL SS (BLADE) IMPLANT
BLADE SURG 15 STRL LF DISP TIS (BLADE) ×1 IMPLANT
BLADE SURG 15 STRL SS (BLADE) ×1
BNDG COHESIVE 4X5 TAN STRL (GAUZE/BANDAGES/DRESSINGS) ×2 IMPLANT
BNDG COHESIVE 6X5 TAN STRL LF (GAUZE/BANDAGES/DRESSINGS) ×2 IMPLANT
BNDG GAUZE ELAST 4 BULKY (GAUZE/BANDAGES/DRESSINGS) ×2 IMPLANT
BONE CEMENT PALACOS R-G (Orthopedic Implant) ×2 IMPLANT
BONE CEMENT PALACOSE (Orthopedic Implant) ×6 IMPLANT
BOWL SMART MIX CTS (DISPOSABLE) ×4 IMPLANT
BRUSH FEMORAL CANAL (MISCELLANEOUS) ×2 IMPLANT
CEMENT BONE PALACOS R-G (Orthopedic Implant) ×1 IMPLANT
CEMENT BONE PALACOSE (Orthopedic Implant) ×3 IMPLANT
CONT SPEC 4OZ CLIKSEAL STRL BL (MISCELLANEOUS) ×2 IMPLANT
COVER SURGICAL LIGHT HANDLE (MISCELLANEOUS) ×2 IMPLANT
CUFF TOURNIQUET SINGLE 18IN (TOURNIQUET CUFF) IMPLANT
CUFF TOURNIQUET SINGLE 24IN (TOURNIQUET CUFF) IMPLANT
CUFF TOURNIQUET SINGLE 34IN LL (TOURNIQUET CUFF) ×2 IMPLANT
CUFF TOURNIQUET SINGLE 44IN (TOURNIQUET CUFF) IMPLANT
DRAIN WOUND SNY 15 RND (WOUND CARE) IMPLANT
DRAPE IMP U-DRAPE 54X76 (DRAPES) ×2 IMPLANT
DURAPREP 26ML APPLICATOR (WOUND CARE) ×2 IMPLANT
ELECT REM PT RETURN 9FT ADLT (ELECTROSURGICAL) ×2
ELECTRODE REM PT RTRN 9FT ADLT (ELECTROSURGICAL) ×1 IMPLANT
EVACUATOR 1/8 PVC DRAIN (DRAIN) ×2 IMPLANT
FACESHIELD WRAPAROUND (MASK) ×2 IMPLANT
GAUZE SPONGE 4X4 12PLY STRL (GAUZE/BANDAGES/DRESSINGS) ×2 IMPLANT
GAUZE XEROFORM 1X8 LF (GAUZE/BANDAGES/DRESSINGS) ×2 IMPLANT
GLOVE BIO SURGEON STRL SZ7.5 (GLOVE) ×2 IMPLANT
GLOVE BIO SURGEON STRL SZ8.5 (GLOVE) ×2 IMPLANT
GLOVE BIOGEL PI IND STRL 6.5 (GLOVE) ×2 IMPLANT
GLOVE BIOGEL PI IND STRL 8 (GLOVE) ×1 IMPLANT
GLOVE BIOGEL PI IND STRL 9 (GLOVE) ×1 IMPLANT
GLOVE BIOGEL PI INDICATOR 6.5 (GLOVE) ×2
GLOVE BIOGEL PI INDICATOR 8 (GLOVE) ×1
GLOVE BIOGEL PI INDICATOR 9 (GLOVE) ×1
GLOVE SURG SS PI 6.5 STRL IVOR (GLOVE) ×4 IMPLANT
GOWN STRL REUS W/ TWL LRG LVL3 (GOWN DISPOSABLE) ×3 IMPLANT
GOWN STRL REUS W/ TWL XL LVL3 (GOWN DISPOSABLE) ×2 IMPLANT
GOWN STRL REUS W/TWL LRG LVL3 (GOWN DISPOSABLE) ×3
GOWN STRL REUS W/TWL XL LVL3 (GOWN DISPOSABLE) ×2
HANDPIECE INTERPULSE COAX TIP (DISPOSABLE) ×1
IMMOBILIZER KNEE 20 (SOFTGOODS) ×2
IMMOBILIZER KNEE 20 THIGH 36 (SOFTGOODS) ×1 IMPLANT
KIT BASIN OR (CUSTOM PROCEDURE TRAY) ×2 IMPLANT
KIT ROOM TURNOVER OR (KITS) ×2 IMPLANT
MANIFOLD NEPTUNE II (INSTRUMENTS) ×2 IMPLANT
MARKER SKIN DUAL TIP RULER LAB (MISCELLANEOUS) ×2 IMPLANT
MOLD SPACER FEM KNEE 53A/PX75M (Spacer) ×1 IMPLANT
MOLD SPACER TIB KNEE 52A/PX81M (Spacer) ×1 IMPLANT
NEEDLE 22X1 1/2 (OR ONLY) (NEEDLE) ×4 IMPLANT
NS IRRIG 1000ML POUR BTL (IV SOLUTION) ×2 IMPLANT
PACK ORTHO EXTREMITY (CUSTOM PROCEDURE TRAY) ×2 IMPLANT
PACK UNIVERSAL I (CUSTOM PROCEDURE TRAY) ×2 IMPLANT
PAD ARMBOARD 7.5X6 YLW CONV (MISCELLANEOUS) ×4 IMPLANT
PENCIL BUTTON HOLSTER BLD 10FT (ELECTRODE) IMPLANT
SET HNDPC FAN SPRY TIP SCT (DISPOSABLE) ×1 IMPLANT
SPACERMOLD FEM KNEE 53A/PX75M (Spacer) ×2 IMPLANT
SPACERMOLD TIB KNEE 52A/PX81M (Spacer) ×2 IMPLANT
SPONGE GAUZE 4X4 12PLY STER LF (GAUZE/BANDAGES/DRESSINGS) ×2 IMPLANT
SPONGE LAP 18X18 X RAY DECT (DISPOSABLE) ×10 IMPLANT
STOCKINETTE IMPERVIOUS 9X36 MD (GAUZE/BANDAGES/DRESSINGS) ×2 IMPLANT
SUT ETHILON 3 0 PS 1 (SUTURE) IMPLANT
SUT VIC AB 0 CTX 36 (SUTURE) ×1
SUT VIC AB 0 CTX36XBRD ANTBCTR (SUTURE) ×1 IMPLANT
SUT VIC AB 1 CT1 27 (SUTURE)
SUT VIC AB 1 CT1 27XBRD ANBCTR (SUTURE) IMPLANT
SUT VIC AB 1 CTX 36 (SUTURE) ×1
SUT VIC AB 1 CTX36XBRD ANBCTR (SUTURE) ×1 IMPLANT
SUT VIC AB 3-0 FS2 27 (SUTURE) ×4 IMPLANT
SYR CONTROL 10ML LL (SYRINGE) ×4 IMPLANT
TOWEL OR 17X24 6PK STRL BLUE (TOWEL DISPOSABLE) ×2 IMPLANT
TOWEL OR 17X26 10 PK STRL BLUE (TOWEL DISPOSABLE) ×2 IMPLANT
TUBE ANAEROBIC SPECIMEN COL (MISCELLANEOUS) IMPLANT
TUBE CONNECTING 12X1/4 (SUCTIONS) ×2 IMPLANT
UNDERPAD 30X30 (UNDERPADS AND DIAPERS) ×2 IMPLANT
WATER STERILE IRR 1000ML POUR (IV SOLUTION) IMPLANT
YANKAUER SUCT BULB TIP NO VENT (SUCTIONS) ×4 IMPLANT

## 2015-10-30 NOTE — Anesthesia Procedure Notes (Signed)
Anesthesia Regional Block:  Adductor canal block  Pre-Anesthetic Checklist: ,, timeout performed, Correct Patient, Correct Site, Correct Laterality, Correct Procedure, Correct Position, site marked, Risks and benefits discussed,  Surgical consent,  Pre-op evaluation,  At surgeon's request and post-op pain management  Laterality: Left  Prep: chloraprep       Needles:  Injection technique: Single-shot  Needle Type: Stimulator Needle - 80     Needle Length: 9cm 9 cm Needle Gauge: 22 and 22 G    Additional Needles:  Procedures: ultrasound guided (picture in chart) Adductor canal block Narrative:  Start time: 10/30/2015 2:40 PM End time: 10/30/2015 2:45 PM Injection made incrementally with aspirations every 5 mL.  Performed by: Personally   Additional Notes: 30 cc 0.5% Bupivacaine with 1:200 Epi injected easily.

## 2015-10-30 NOTE — OR Nursing (Signed)
Pt transferred to 5N06 in bed.  VSS. Continuous pulse ox ordered. Pt states 8/10 pain level, down from "12".  Pain is "coming down".  Handoff report given to [facility].

## 2015-10-30 NOTE — Anesthesia Postprocedure Evaluation (Signed)
Anesthesia Post Note  Patient: Richard [name]  Procedure(s) Performed: Procedure(s) (LRB): IRRIGATION AND DEBRIDEMENT KNEE WITH POLY EXCHANGE PLACE SPACERS (Left)  Patient location during evaluation: PACU Anesthesia Type: General Level of consciousness: awake and alert Pain management: pain level controlled Vital Signs Assessment: post-procedure vital signs reviewed and stable Respiratory status: spontaneous breathing, nonlabored ventilation, respiratory function stable and patient connected to nasal cannula oxygen Cardiovascular status: blood pressure returned to baseline and stable Postop Assessment: no signs of nausea or vomiting Anesthetic complications: no    Last Vitals:  Vitals:   10/30/15 2005 10/30/15 2015  BP: (!) 135/93   Pulse: 61 65  Resp: 16 13  Temp:      Last Pain:  Vitals:   10/30/15 2000  TempSrc:   PainSc: 10-Worst pain ever                 Kai Railsback A

## 2015-10-30 NOTE — Progress Notes (Signed)
Orthopedic Tech Progress Note Patient Details:  Richard [name] 08/11/1960 [id number]  Ortho Devices Type of Ortho Device: Knee Immobilizer Ortho Device/Splint Location: foot roll Ortho Device/Splint Interventions: Application   [name] 10/30/2015, 11:00 PM

## 2015-10-30 NOTE — Anesthesia Preprocedure Evaluation (Addendum)
Anesthesia Evaluation  Patient identified by MRN, date of birth, ID band Patient awake    Reviewed: Allergy & Precautions, NPO status , Patient's Chart, lab work & pertinent test results  Airway Mallampati: II  TM Distance: >3 FB Neck ROM: Full    Dental  (+) Teeth Intact, Dental Advisory Given   Pulmonary    breath sounds clear to auscultation       Cardiovascular hypertension,  Rhythm:Regular     Neuro/Psych    GI/Hepatic   Endo/Other    Renal/GU      Musculoskeletal   Abdominal   Peds  Hematology   Anesthesia Other Findings   Reproductive/Obstetrics                             Anesthesia Physical Anesthesia Plan  ASA: III  Anesthesia Plan: General   Post-op Pain Management:    Induction: Intravenous  Airway Management Planned: Oral ETT  Additional Equipment:   Intra-op Plan:   Post-operative Plan: Extubation in OR  Informed Consent: I have reviewed the patients History and Physical, chart, labs and discussed the procedure including the risks, benefits and alternatives for the proposed anesthesia with the patient or authorized representative who has indicated his/her understanding and acceptance.   Dental advisory given  Plan Discussed with: CRNA and Anesthesiologist  Anesthesia Plan Comments:         Anesthesia Quick Evaluation

## 2015-10-30 NOTE — Op Note (Signed)
PATIENT ID:      Richard [name]  MRN:     [id number] DOB/AGE:    54/08/1960 / 55 y.o.       OPERATIVE REPORT    DATE OF PROCEDURE:  10/30/2015       PREOPERATIVE DIAGNOSIS:   INFECTED LEFT TOTAL KNEE ARTHROPLASTY      Estimated body mass index is 39.58 kg/m as calculated from the following:   Height as of 10/02/15: 5\' 9"  (1.753 m).   Weight as of this encounter: 268 lb (121.6 kg).                                                        POSTOPERATIVE DIAGNOSIS:   INFECTED LEFT TOTAL KNEE ARTHROPLASTY                                                                      PROCEDURE: Radical irrigation and debridement of infected total knee arthroplasty with removal of implants and placement of antibiotic impregnated PMMA spacer     SURGEON: Marvetta Vohs J    ASSISTANT:   Eric K. Barton Dubois  (present throughout entire procedure and necessary for timely completion of the procedure)  ANESTHESIA: GET   DRAINS: foley, 2 medium hemovac in knee   TOURNIQUET TIME: 71min   COMPLICATIONS:  None     SPECIMENS: Infected Synovial tissue for Gram stain and culture.   INDICATIONS FOR PROCEDURE: Patient is status post total knee arthroplasty in December 2016 and postoperatively had trouble with arthrofibrosis as well as brawny scar tissue. He had a closed manipulation he's had a couple of aspirations and over the summer actually had arthroscopic removal of inflamed scar tissue followed by aggressive physical therapy. Because of persistent pain and swelling he was seen in consultation his CRP which was 4 in June 2017 had gone to 41. His sedimentation rate which was 41 in June 2017 had gone to 21 when I saw him last week. I repeat his aspiration and he came back with coagulase negative staph species. This is consistent with a smoldering infection with brawny edema. He did not have an elevated peripheral white count. When I aspirated his knee only back 3 mL of dark bloody material. Because of the chronicity of  his infection probably going back almost a year we have discussed options and elected to proceed with open removal of infected tissue and all implants for by placement of a articulated PMMA spacer using plastic molds. The cement will be loaded with tobramycin and vancomycin based on the culture results. The risks and benefits of surgery been discussed at length and all questions answered. Patient understands that there is at least a 15% chance infection will come back even if we do everything right. There is a significant risk of the requirement of arthrodesis or even possible above-knee amputation and this was discussed with his wife as well.   DESCRIPTION OF PROCEDURE: The patient identified by armband, received  And IV antibiotics, in the holding area at [facility]. Patient taken to the operating room, appropriate anesthetic  monitors were attached General endotracheal anesthesia induced with  the patient in supine position, Foley catheter was inserted. Tourniquet  applied high to the operative thigh. Lateral post and foot positioner  applied to the table, the lower extremity was then prepped and draped  in usual sterile fashion from the ankle to the tourniquet. Time-out procedure was performed. Because of the patient's history of DVTs and pulmonary emboli after his surgery in December 2016 we proceeded without the tourniquet keeping the knee flexed to at least 90. We began the operation by making the anterior midline incision starting at handbreadth above the patella going over the patella 1 cm medial to and  6 cm distal to the tibial tubercle. Small bleeders in the skin and the  subcutaneous tissue identified and cauterized. Transverse retinaculum was incised and reflected medially and a medial parapatellar arthrotomy was accomplished. We immediately encountered inflamed synovium and specimens were sent for Gram stain and culture.the patella was everted and the superficial medial collateral  ligament was then elevated from anterior to posterior along the proximal  flare of the tibia. The patient had large amounts of heterotopic bone in the soft tissues that was resected. We then set about sharply excising all inflamed synovium that was visible with the patella everted and the knee flexed.  Polyethylene bearing was then removed sharply with an osteotome.  With the knee hyperflexed.  We then used the thin osteotomes underneath the anterior chamfer, and distal cuts of the femoral component to remove it from the distal femur.  Underlying bone had areas of decay over about 30% of the surface. The rest of the bone was in relatively good condition. Curettes and rongeurs were then used to remove bits of bone cement that were still embedded in the femur.  With the knee hyperflexed and the tibia externally rotated.  We then removed inflamed tissue from around the tibial tray, and using thin osteotomes and an oscillating saw disrupted the interface between the tibial implant and the tibial plateaus.  A mallet and a punch from the [facility] cement revision set were then used to extract the tibial component. Again, curettes, osteotomes, and rongeurs were used to extract cement from the tibial canal.  We also removed inflamed posterior synovium now that the implants were out.  The wound was then thoroughly irrigated out with normal saline solution, and all surfaces dried with sponges.  A double batch of [place] cement was then mixed with 2 vials of tobramycin 1.2 g and 2 vials of vancomycin 1 g and applied to the surfaces of a large KASSM femoral and tibial molds. The tibial mold was set to 10 mm. While the cement was still relatively soft we then applied the femoral mold to the distal femur and held in place while at cured. He then checked our gaps one more time cauterized small bleeders and cemented in place the tibial component with another batch of antibiotic-impregnated cement applied to the mating surfaces of  the tibial side of the component. The knee was then held in 45 flexion while the cement cured. The wound was again irrigated out normal saline solution and once again soaked with a trans-Amick acid soaked sponge. We then applied X roll to the soft tissues 20 mL mixed with 50 mL of quarter percent Marcaine with epinephrine solution. Injected into the soft tissues with a 22-gauge by one half inch needle.  After the cement had cured, 2 large bore Hemovac drains were placed in the wound. We then closed  in layers with running 0 Vicryl in the capsule, 2-0 undyed Vicryl in the subcutaneous tissue and subcuticular 3-0 Vicryl suture. A dressing of Xeroform, 4 x 4 dressings, sponges, web roll, Ace wrap and a knee immobilizer were then placed. The patient was then awakened, extubated, and taken to recovery room without difficulty. Patient lab placement of a PICC line tomorrow and the plan is 6 weeks of IV antibiotics.   Frederik Pear J 10/30/2015, 6:15 PM

## 2015-10-30 NOTE — Transfer of Care (Signed)
Immediate Anesthesia Transfer of Care Note  Patient: Richard [name]  Procedure(s) Performed: Procedure(s) with comments: IRRIGATION AND DEBRIDEMENT KNEE WITH POLY EXCHANGE PLACE SPACERS (Left) - OFF ELIQUIST 3 DAYS  Patient Location: PACU  Anesthesia Type:General  Level of Consciousness: awake, oriented and patient cooperative  Airway & Oxygen Therapy: Patient Spontanous Breathing and Patient connected to nasal cannula oxygen  Post-op Assessment: Report given to RN, Post -op Vital signs reviewed and stable and Patient moving all extremities  Post vital signs: Reviewed and stable  Last Vitals:  Vitals:   10/30/15 1445 10/30/15 1906  BP:  (!) (P) 98/53  Pulse: 62   Resp: 15   Temp:      Last Pain:  Vitals:   10/30/15 1326  TempSrc:   PainSc: 8          Complications: No apparent anesthesia complications

## 2015-10-30 NOTE — Anesthesia Procedure Notes (Signed)
Procedure Name: Intubation Date/Time: 10/30/2015 3:53 PM Performed by: Rejeana Brock L Pre-anesthesia Checklist: Patient identified, Emergency Drugs available, Suction available and Patient being monitored Patient Re-evaluated:Patient Re-evaluated prior to inductionOxygen Delivery Method: Circle System Utilized Preoxygenation: Pre-oxygenation with 100% oxygen Intubation Type: IV induction Ventilation: Mask ventilation without difficulty Laryngoscope Size: Mac and 4 Grade View: Grade I Tube type: Oral Tube size: 7.5 mm Number of attempts: 1 Airway Equipment and Method: Stylet and Oral airway Placement Confirmation: ETT inserted through vocal cords under direct vision,  positive ETCO2 and breath sounds checked- equal and bilateral Secured at: 22 cm Tube secured with: Tape Dental Injury: Teeth and Oropharynx as per pre-operative assessment

## 2015-10-31 ENCOUNTER — Encounter (HOSPITAL_COMMUNITY): Payer: Self-pay | Admitting: Orthopedic Surgery

## 2015-10-31 DIAGNOSIS — Y792 Prosthetic and other implants, materials and accessory orthopedic devices associated with adverse incidents: Secondary | ICD-10-CM

## 2015-10-31 DIAGNOSIS — B957 Other staphylococcus as the cause of diseases classified elsewhere: Secondary | ICD-10-CM

## 2015-10-31 DIAGNOSIS — M00062 Staphylococcal arthritis, left knee: Secondary | ICD-10-CM

## 2015-10-31 DIAGNOSIS — Z880 Allergy status to penicillin: Secondary | ICD-10-CM

## 2015-10-31 DIAGNOSIS — T8454XD Infection and inflammatory reaction due to internal left knee prosthesis, subsequent encounter: Secondary | ICD-10-CM

## 2015-10-31 LAB — CBC
HCT: 34.2 % — ABNORMAL LOW (ref 39.0–52.0)
Hemoglobin: 10.8 g/dL — ABNORMAL LOW (ref 13.0–17.0)
MCH: 26 pg (ref 26.0–34.0)
MCHC: 31.6 g/dL (ref 30.0–36.0)
MCV: 82.4 fL (ref 78.0–100.0)
Platelets: 241 10*3/uL (ref 150–400)
RBC: 4.15 MIL/uL — ABNORMAL LOW (ref 4.22–5.81)
RDW: 15.1 % (ref 11.5–15.5)
WBC: 15.4 10*3/uL — ABNORMAL HIGH (ref 4.0–10.5)

## 2015-10-31 LAB — BASIC METABOLIC PANEL
Anion gap: 10 (ref 5–15)
BUN: 17 mg/dL (ref 6–20)
CO2: 24 mmol/L (ref 22–32)
Calcium: 8.8 mg/dL — ABNORMAL LOW (ref 8.9–10.3)
Chloride: 102 mmol/L (ref 101–111)
Creatinine, Ser: 1.48 mg/dL — ABNORMAL HIGH (ref 0.61–1.24)
GFR calc Af Amer: >60 mL/min (ref >60)
GFR calc non Af Amer: 52 mL/min — ABNORMAL LOW (ref >60)
Glucose, Bld: 165 mg/dL — ABNORMAL HIGH (ref 65–99)
Potassium: 4.8 mmol/L (ref 3.5–5.1)
Sodium: 136 mmol/L (ref 135–145)

## 2015-10-31 MED ORDER — SODIUM CHLORIDE 0.9% FLUSH
10.0000 mL | INTRAVENOUS | Status: DC | PRN
  Administered 2015-11-01 – 2015-11-03 (×3): 10 mL
  Filled 2015-10-31 (×3): qty 40

## 2015-10-31 NOTE — Progress Notes (Signed)
PICC line placed by IV team. No orders for IV vancomycin. Spoke with pharmacy. Will talk with clinical pharmacist to get orders for IV vancomycin.   Fritz Pickerel, RN

## 2015-10-31 NOTE — Evaluation (Signed)
Physical Therapy Evaluation Patient Details Name: Richard [name] MRN: [id number] DOB: 1960/12/26 Today's Date: 10/31/2015   History of Present Illness  55 y.o. male s/p L knee I&D, TKA hardware removal and spacer placement.  Clinical Impression  Pt admitted with above diagnosis. Pt currently with functional limitations due to the deficits listed below (see PT Problem List). On eval, pt required mod assist bed mobility and transfers. Min assist needed for ambulation 3 feet with RW. Gait distance limited by pain and fatigue. Pt will benefit from skilled PT to increase their independence and safety with mobility to allow discharge to the venue listed below.       Follow Up Recommendations Home health PT;Supervision - Intermittent    Equipment Recommendations  None recommended by PT    Recommendations for Other Services       Precautions / Restrictions Precautions Precautions: Knee Required Braces or Orthoses: Knee Immobilizer - Left Knee Immobilizer - Left: On at all times (except with PT) Restrictions Weight Bearing Restrictions: Yes LLE Weight Bearing: Partial weight bearing LLE Partial Weight Bearing Percentage or Pounds: 50% Other Position/Activity Restrictions: encourage AROM L knee per MD PN 10/31/15.      Mobility  Bed Mobility Overal bed mobility: Needs Assistance Bed Mobility: Supine to Sit     Supine to sit: Mod assist;HOB elevated     General bed mobility comments: continuous verbal cues for sequencing. +rail  Transfers Overall transfer level: Needs assistance Equipment used: Rolling walker (2 wheeled) Transfers: Sit to/from [facility] Sit to Stand: Mod assist Stand pivot transfers: Min assist;+2 safety/equipment       General transfer comment: increased time to power up and attain full upright stance, verbal cues for hand placement and sequencing  Ambulation/Gait Ambulation/Gait assistance: Min assist Ambulation Distance (Feet): 3  Feet Assistive device: Rolling walker (2 wheeled) Gait Pattern/deviations: Step-to pattern;Antalgic;Decreased stride length;Decreased stance time - left Gait velocity: decreased Gait velocity interpretation: Below normal speed for age/gender    Stairs            Wheelchair Mobility    Modified Rankin (Stroke Patients Only)       Balance                                             Pertinent Vitals/Pain Pain Assessment: 0-10 Pain Score: 5  Pain Location: L knee with mobility Pain Descriptors / Indicators: Sore Pain Intervention(s): Limited activity within patient's tolerance;Monitored during session;Repositioned    Home Living Family/patient expects to be discharged to:: Private residence Living Arrangements: Spouse/significant other;Parent;Children Available Help at Discharge: Family;Available 24 hours/day Type of Home: House Home Access: Stairs to enter Entrance Stairs-Rails: Right Entrance Stairs-Number of Steps: 3 Home Layout: One level Home Equipment: Cane - quad;Crutches;Walker - standard;Bedside commode      Prior Function Level of Independence: Independent with assistive device(s)         Comments: Pt ambulated with one axillary crutch on right. Pt driving and ambulating limited community distances.     Hand Dominance   Dominant Hand: Right    Extremity/Trunk Assessment                         Communication   Communication: No difficulties  Cognition Arousal/Alertness: Awake/alert Behavior During Therapy: WFL for tasks assessed/performed Overall Cognitive Status: Within Functional Limits for tasks assessed  General Comments      Exercises Total Joint Exercises Ankle Circles/Pumps: AROM;Both;10 reps Quad Sets: AROM;Left;5 reps Heel Slides: AROM;Left;5 reps Goniometric ROM: 20-45 degrees L knee AROM   Assessment/Plan    PT Assessment Patient needs continued PT services  PT  Problem List Decreased strength;Decreased range of motion;Decreased activity tolerance;Decreased balance;Decreased mobility;Pain;Decreased knowledge of precautions          PT Treatment Interventions DME instruction;Gait training;Stair training;Functional mobility training;Balance training;Therapeutic exercise;Patient/family education;Therapeutic activities    PT Goals (Current goals can be found in the Care Plan section)  Acute Rehab PT Goals Patient Stated Goal: home PT Goal Formulation: With patient Time For Goal Achievement: 11/14/15 Potential to Achieve Goals: Good    Frequency 7X/week   Barriers to discharge        Co-evaluation               End of Session Equipment Utilized During Treatment: Gait belt;Left knee immobilizer Activity Tolerance: Patient limited by pain Patient left: in chair;with chair alarm set;with call bell/phone within reach;Other (comment) (in zero knee on L) Nurse Communication: Mobility status         Time: [id number] PT Time Calculation (min) (ACUTE ONLY): 24 min   Charges:   PT Evaluation $PT Eval Moderate Complexity: 1 Procedure PT Treatments $Therapeutic Activity: 8-22 mins   PT G Codes:        Lorriane Shire 10/31/2015, 9:56 AM

## 2015-10-31 NOTE — Care Management Note (Signed)
Case Management Note  Patient Details  Name: Richard [name] MRN: [id number] Date of Birth: 1960/09/09  Subjective/Objective:   55 yr old male s/p Radical I & D of left infected total knee arthroplasty, with removal of implants and placement of antibiotic PMMA spacer.                 Action/Plan: Patient will need RN for IV antibiotics and PICC Care, will also need HHPT. Patient is insured through [employer] [place], [place]  contacted [name] to arrange for Home Health services.[phone]- faxed demographics and orders to: Intake : [phone]. Intake Referral number: [id number].  Expected Discharge Date:                  Expected Discharge Plan:  [place]  In-House Referral:     Discharge planning Services  CM Consult  Post Acute Care Choice:  Home Health Choice offered to:   ([name] has to arrange -Patient has [place])  DME Arranged:  IV pump/equipment DME Agency:     HH Arranged:  PT, RN [place] Agency:     Status of Service:  In process, will continue to follow  If discussed at Long Length of Stay Meetings, dates discussed:    Additional Comments:  Ninfa Meeker, RN 10/31/2015, 4:08 PM

## 2015-10-31 NOTE — Progress Notes (Signed)
PATIENT ID: Richard [name]  MRN: [id number]  DOB/AGE:  06/04/1960 / 55 y.o.  1 Day Post-Op Procedure(s) (LRB): IRRIGATION AND DEBRIDEMENT KNEE WITH POLY EXCHANGE PLACE SPACERS (Left)    PROGRESS NOTE Subjective: Patient is alert, oriented, no Nausea, no Vomiting, yes passing gas. Taking PO well. Denies SOB, Chest or Calf Pain. Using Incentive Spirometer, PAS in place. Ambulate 50% pwb, NO CPM,Has articulating PMMA spacer loaded with vancomycin, and tobramycin. Patient reports pain as 7/10 .    Objective: Vital signs in last 24 hours: Vitals:   10/30/15 2110 10/30/15 2358 10/31/15 0037 10/31/15 0504  BP: 116/71  121/78 108/63  Pulse: 71 78 75 85  Resp: 20 18 18 18   Temp: 98.1 F (36.7 C)  98.2 F (36.8 C) 98.4 F (36.9 C)  TempSrc: Oral  Oral Oral  SpO2: 99% 95% 96% 95%  Weight:          Intake/Output from previous day: I/O last 3 completed shifts: In: 2240 [P.O.:240; I.V.:2000] Out: 1825 [Urine:525; Drains:550; Blood:750]   Intake/Output this shift: No intake/output data recorded.   LABORATORY DATA:  Recent Labs  10/30/15 1348 10/31/15 0505  WBC 8.9 15.4*  HGB 14.3 10.8*  HCT 44.8 34.2*  PLT 264 241  NA 139 136  K 4.4 4.8  CL 108 102  CO2 23 24  BUN 15 17  CREATININE 1.19 1.48*  GLUCOSE 82 165*  INR 1.10  --   CALCIUM 9.5 8.8*    Examination: Neurologically intact ABD soft Neurovascular intact Sensation intact distally Intact pulses distally Dorsiflexion/Plantar flexion intact Incision: dressing C/D/I No cellulitis present Compartment soft}  Culture results from Left total knee aspiration in office on 10/25/15:     Assessment:   1 Day Post-Op : Removal of infected left total knee prosthesis, radical irrigation and debridement of all cement and infected tissues.  Organism staph coag negative, resistant to oxacillin, sensitive to all others. ADDITIONAL DIAGNOSIS: Expected Acute Blood Loss Anemia, history of bilateral pulmonary embolism last year  after total knee, history of paroxysmal atrial fibrillation, severe sleep apnea, morbid obesity, Left ventricular hypertrophy.  Plan: 1.  Infectious diseases consult to manage IV antibiotics, tentatively plan 6 wks. 2.  We'll restart full dose Eliquis 5mg  PO Bid, today as patient has history of bilateral pulmonary emboli after his total knee last year. 3.  The patient is 50% partial weightbearing on the left lower extremity, active range of motion is encouraged, knee immobilizer to work on extension as he had a 30 flexion contracture preoperatively.  No CPM machine. 4.  Drains will remain in place for the next 48 hours, hopefully they will go quiet and will be discontinued. If not, further surgical debridement may be required.  DISCHARGE PLAN: Home DISCHARGE NEEDS: HHPT, Walker, 3-in-1 comode seat and IV Antibiotics Per infectious disease consultant     Elika Godar J 10/31/2015, 7:31 AM

## 2015-10-31 NOTE — Progress Notes (Signed)
Peripherally Inserted Central Catheter/Midline Placement  The IV Nurse has discussed with the patient and/or persons authorized to consent for the patient, the purpose of this procedure and the potential benefits and risks involved with this procedure.  The benefits include less needle sticks, lab draws from the catheter, and the patient may be discharged home with the catheter. Risks include, but not limited to, infection, bleeding, blood clot (thrombus formation), and puncture of an artery; nerve damage and irregular heartbeat and possibility to perform a PICC exchange if needed/ordered by physician.  Alternatives to this procedure were also discussed.  Bard Power PICC patient education guide, fact sheet on infection prevention and patient information card has been provided to patient /or left at bedside.    PICC/Midline Placement Documentation  PICC Single Lumen [id number] PICC Right Basilic 43 cm 2 cm (Active)  Indication for Insertion or Continuance of Line Home intravenous therapies (PICC only) 10/31/2015  5:00 PM  Exposed Catheter (cm) 2 cm 10/31/2015  5:00 PM  Dressing Change Due 11/07/15 10/31/2015  5:00 PM       Jule Economy Horton 10/31/2015, 5:11 PM

## 2015-10-31 NOTE — Evaluation (Signed)
Occupational Therapy Evaluation Patient Details Name: Richard [name] MRN: [id number] DOB: 12/14/1960 Today's Date: 10/31/2015    History of Present Illness 55 y.o. male s/p L knee I&D, TKA hardware removal and spacer placement.   Clinical Impression   Pt admitted with above diagnosis and presents with functional limitations due to the deficits listed below (see OT Problem List).  Pt required mod assist bed mobility and min guard sit > stand with use of RW.  Pt unable to ambulate or complete transfers this session due to pain.  However able to maintain standing with PWB through LLE while standing for 2 mins.  Pt will benefit from skilled OT to increase his independence and safety with mobility to allow discharge to the venue listed below.    Follow Up Recommendations  Supervision - Intermittent;Home health OT    Equipment Recommendations  Other (comment) (TBD)    Recommendations for Other Services       Precautions / Restrictions Precautions Precautions: Knee Required Braces or Orthoses: Knee Immobilizer - Left Knee Immobilizer - Left: On at all times (except with PT) Restrictions Weight Bearing Restrictions: Yes LLE Weight Bearing: Partial weight bearing LLE Partial Weight Bearing Percentage or Pounds: 50% Other Position/Activity Restrictions: encourage AROM L knee per MD PN 10/31/15.      Mobility Bed Mobility   Bed Mobility: Supine to Sit;Sit to Supine     Supine to sit: Mod assist;HOB elevated Sit to supine: Mod assist;HOB elevated      Transfers Overall transfer level: Needs assistance Equipment used: Rolling walker (2 wheeled) Transfers: Sit to/from Stand Sit to Stand: Min assist                   ADL Overall ADL's : Needs assistance/impaired         Upper Body Bathing: Set up   Lower Body Bathing: Moderate assistance   Upper Body Dressing : Set up   Lower Body Dressing: Total assistance                                  Pertinent Vitals/Pain Pain Assessment: 0-10 Pain Score: 7  Pain Location: Lt knee Pain Descriptors / Indicators: Sore Pain Intervention(s): Limited activity within patient's tolerance;Monitored during session;Repositioned;Premedicated before session     Hand Dominance Right   Extremity/Trunk Assessment Upper Extremity Assessment Upper Extremity Assessment: Overall WFL for tasks assessed           Communication Communication Communication: No difficulties   Cognition Arousal/Alertness: Awake/alert Behavior During Therapy: WFL for tasks assessed/performed Overall Cognitive Status: Within Functional Limits for tasks assessed                                Home Living Family/patient expects to be discharged to:: Private residence Living Arrangements: Spouse/significant other;Parent;Children Available Help at Discharge: Family;Available 24 hours/day Type of Home: House Home Access: Stairs to enter [employer] of Steps: 3 Entrance Stairs-Rails: Right Home Layout: One level     Bathroom Shower/Tub: Tub/shower unit Shower/tub characteristics: Curtain Biochemist, clinical: Handicapped height Bathroom Accessibility: Yes   Home Equipment: Cane - quad;Crutches;Walker - standard;Bedside commode          Prior Functioning/Environment Level of Independence: Independent with assistive device(s)                 OT Problem List: Decreased strength;Decreased range of  motion;Decreased activity tolerance;Impaired balance (sitting and/or standing);Decreased knowledge of use of DME or AE;Cardiopulmonary status limiting activity;Pain   OT Treatment/Interventions: Self-care/ADL training;Energy conservation;DME and/or AE instruction;Therapeutic activities;Patient/family education    OT Goals(Current goals can be found in the care plan section) Acute Rehab OT Goals Patient Stated Goal: to go home OT Goal Formulation: With patient Time For Goal Achievement:  11/14/15 Potential to Achieve Goals: Good  OT Frequency: Min 2X/week   Barriers to D/C:               End of Session Equipment Utilized During Treatment: Gait belt;Rolling walker;Left knee immobilizer Nurse Communication: Patient requests pain meds  Activity Tolerance: Patient limited by pain Patient left: in bed;with call bell/phone within reach;with nursing/sitter in room;with family/visitor present   Time: [id number] OT Time Calculation (min): 23 min Charges:  OT Evaluation $OT Eval Moderate Complexity: 1 Procedure OT Treatments $Therapeutic Activity: 8-22 mins G-CodesSimonne Come, [id number] 10/31/2015, 4:01 PM

## 2015-10-31 NOTE — Consult Note (Signed)
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 Mayer Camel   HPI:  Richard [name] is an 55 y.o. male with prosthetic knee infection  He was status post total knee arthroplasty in December 2016 and postoperatively had trouble with arthrofibrosis as well as brawny scar tissue. He had a closed manipulation he's had a couple of aspirations and over the summer actually had arthroscopic removal of inflamed scar tissue followed by aggressive physical therapy. Because of persistent pain and swelling he was seen in consultation his CRP which was 4 in June 2017 had gone to 41. His sedimentation rate which was 41 in June 2017 had gone to 21 when Dr Mayer Camel I saw him last week. He  Repeated his aspiration and he came back with coagulase negative staph species:     He is now gone radical irrigation and debridement of the infected total knee arthroplasty removal of implants and placement of antibiotic spacer. He has been on placed on IV vancomycin.   Past Medical History:  Diagnosis Date  . Arthritis   . Arthrofibrosis of total knee arthroplasty ([place]) 05/30/2015  . Bilateral pulmonary embolism ([place])    a. 03/2015 CTA: acute bilat PE.  Marland Kitchen Chest pain    a. 2015 Abnl MV;  b. 2015 Cath: nl cors.  . Chronic diastolic CHF (congestive heart failure) ([place])    a. 03/2015 Echo: EF 55-65%, poor windows, mildly dil RV.  Marland Kitchen DVT (deep venous thrombosis) ([place])    a. 03/2015 U/S: occlusive DVT w/in the distal aspect of the Left fem vein through the L popliteal vein.  Marland Kitchen Dysrhythmia    PAF  . GERD (gastroesophageal reflux disease)   . Headache   . Hypertension   . Hypertensive heart disease   . Obesity   . OSA (obstructive sleep apnea)    uses CPAP nightly  . Primary localized osteoarthritis of left knee    a. 11/2014 s/p L TKA.  . Primary localized osteoarthritis of right knee    a.  02/2014 s/p R Partial Knee arthroplasty.  . Stiffness of left knee    a. 12/2014 s/p manipulation under anesthesia.  . Urine incontinence     Past Surgical History:  Procedure Laterality Date  . APPENDECTOMY    . CARDIAC CATHETERIZATION    . EXAM UNDER ANESTHESIA WITH MANIPULATION OF KNEE Left 05/30/2015   Procedure: EXAM UNDER ANESTHESIA WITH MANIPULATION OF LEFT KNEE;  Surgeon: Marchia Bond, MD;  Location: [place];  Service: Orthopedics;  Laterality: Left;  . I&D KNEE WITH POLY EXCHANGE Left 10/30/2015   Procedure: IRRIGATION AND DEBRIDEMENT KNEE WITH POLY EXCHANGE PLACE SPACERS;  Surgeon: Frederik Pear, MD;  Location: [place];  Service: Orthopedics;  Laterality: Left;  OFF ELIQUIST 3 DAYS  . IVC FILTER PLACEMENT ([facility] HX)  04/13/15  . JOINT REPLACEMENT    . KNEE ARTHROSCOPY Left 08/08/2015   Procedure: LEFT KNEE MANIPULATION WITH ARTHROSCOPIC LYSIS OF ADHESIONS AND ASPIRATION;  Surgeon: Marchia Bond, MD;  Location: [place];  Service: Orthopedics;  Laterality: Left;  . KNEE CLOSED REDUCTION Left 01/20/2015   Procedure: LEFT KNEE MANIPULATION;  Surgeon: Marchia Bond, MD;  Location: [place];  Service: Orthopedics;  Laterality: Left;  . LEFT HEART CATHETERIZATION WITH CORONARY ANGIOGRAM N/A 01/12/2014   Procedure: LEFT HEART CATHETERIZATION WITH CORONARY ANGIOGRAM;  Surgeon: Jettie Booze, MD;  Location: [facility] CATH  LAB;  Service: Cardiovascular;  Laterality: N/A;  . ORTHOPEDIC SURGERY Left    arthroscopy x3  . PARTIAL KNEE ARTHROPLASTY Right 02/25/2014   Procedure: RIGHT KNEE ARTHROPLASTY CONDYLE AND PLATEAU MEDIAL COMPARTMENT ;  Surgeon: Johnny Bridge, MD;  Location: [place];  Service: Orthopedics;  Laterality: Right;  . PERIPHERAL VASCULAR CATHETERIZATION N/A 03/29/2015   Procedure: IVC Filter Insertion, and possible thrombectomy;  Surgeon: Katha Cabal, MD;  Location: [place] CV LAB;  Service: Cardiovascular;  Laterality: N/A;  . TOTAL KNEE  ARTHROPLASTY  12/06/2014   Procedure: TOTAL KNEE ARTHROPLASTY;  Surgeon: Marchia Bond, MD;  Location: [place];  Service: Orthopedics;;    Social History:  reports that he has never smoked. He has never used smokeless tobacco. He reports that he drinks alcohol. He reports that he does not use drugs.   Family History  Problem Relation Age of Onset  . Coronary artery disease Mother   . Hyperlipidemia Mother   . Hypertension Mother   . Arthritis Mother   . Coronary artery disease Father   . Heart disease Paternal Grandmother   . Alzheimer's disease Paternal Grandfather     Allergies  Allergen Reactions  . Penicillins Other (See Comments)    Received ancef 3gms with no obvious reaction. 08/08/15 Has patient had a PCN reaction causing immediate rash, facial/tongue/throat swelling, SOB or lightheadedness with hypotension: Unknown, childhood reaction Has patient had a PCN reaction causing severe rash involving mucus membranes or skin necrosis: No Has patient had a PCN reaction that required hospitalization No Has patient had a PCN reaction occurring within the last 10 years: No If all of the above answers are "NO", then may proceed with Ce     Medications: I have reviewed patients current medications as documented in Epic Anti-infectives    Start     Dose/Rate Route Frequency Ordered Stop   10/31/15 0600  vancomycin (VANCOCIN) IVPB 1000 mg/200 mL premix     1,000 mg 200 mL/hr over 60 Minutes Intravenous On call to O.R. 10/30/15 1250 10/30/15 1442   10/31/15 0600  vancomycin (VANCOCIN) IVPB 1000 mg/200 mL premix     1,000 mg 200 mL/hr over 60 Minutes Intravenous Every 12 hours 10/30/15 2118 10/31/15 0711   10/30/15 1627  vancomycin (VANCOCIN) powder  Status:  Discontinued       As needed 10/30/15 1627 10/30/15 1901   10/30/15 1626  tobramycin (NEBCIN) powder  Status:  Discontinued       As needed 10/30/15 1626 10/30/15 1901         ROS:as in HPI otherwise remainder of 12 point  Review of Systems is negative   Blood pressure 128/63, pulse 90, temperature 98.8 F (37.1 C), temperature source Oral, resp. rate 18, weight 268 lb (121.6 kg), SpO2 96 %. General: Alert and awake, oriented x3, not in any acute distress. HEENT: anicteric sclera,  EOMI, oropharynx clear and without exudate Cardiovascular: regular rate, normal r,  no murmur rubs or gallops Pulmonary: clear to auscultation bilaterally, no wheezing, rales or rhonchi Gastrointestinal: soft nontender, nondistended, normal bowel sounds, Musculoskeletal: Knee wrapped Skin, soft tissue: no rashes Neuro: nonfocal, strength and sensation intact   Results for orders placed or performed during the hospital encounter of 10/30/15 (from the past 48 hour(s))  ABO/Rh     Status: None   Collection Time: 10/30/15  1:37 PM  Result Value Ref Range   ABO/RH(D) A POS   Protime-INR     Status: None  Collection Time: 10/30/15  1:48 PM  Result Value Ref Range   Prothrombin Time 14.3 11.4 - 15.2 seconds   INR 1.10   APTT     Status: None   Collection Time: 10/30/15  1:48 PM  Result Value Ref Range   aPTT 30 24 - 36 seconds  Basic metabolic panel     Status: None   Collection Time: 10/30/15  1:48 PM  Result Value Ref Range   Sodium 139 135 - 145 mmol/L   Potassium 4.4 3.5 - 5.1 mmol/L   Chloride 108 101 - 111 mmol/L   CO2 23 22 - 32 mmol/L   Glucose, Bld 82 65 - 99 mg/dL   BUN 15 6 - 20 mg/dL   Creatinine, Ser 1.19 0.61 - 1.24 mg/dL   Calcium 9.5 8.9 - 10.3 mg/dL   GFR calc non Af Amer >60 >60 mL/min   GFR calc Af Amer >60 >60 mL/min    Comment: (NOTE) The eGFR has been calculated using the CKD EPI equation. This calculation has not been validated in all clinical situations. eGFR's persistently <60 mL/min signify possible Chronic Kidney Disease.    Anion gap 8 5 - 15  CBC WITH DIFFERENTIAL     Status: None   Collection Time: 10/30/15  1:48 PM  Result Value Ref Range   WBC 8.9 4.0 - 10.5 K/uL   RBC 5.36 4.22  - 5.81 MIL/uL   Hemoglobin 14.3 13.0 - 17.0 g/dL   HCT 44.8 39.0 - 52.0 %   MCV 83.6 78.0 - 100.0 fL   MCH 26.7 26.0 - 34.0 pg   MCHC 31.9 30.0 - 36.0 g/dL   RDW 15.0 11.5 - 15.5 %   Platelets 264 150 - 400 K/uL   Neutrophils Relative % 62 %   Neutro Abs 5.5 1.7 - 7.7 K/uL   Lymphocytes Relative 24 %   Lymphs Abs 2.2 0.7 - 4.0 K/uL   Monocytes Relative 11 %   Monocytes Absolute 0.9 0.1 - 1.0 K/uL   Eosinophils Relative 3 %   Eosinophils Absolute 0.2 0.0 - 0.7 K/uL   Basophils Relative 0 %   Basophils Absolute 0.0 0.0 - 0.1 K/uL  Type and screen Order type and screen if day of surgery is less than 15 days from draw of preadmission visit or order morning of surgery if day of surgery is greater than 6 days from preadmission visit.     Status: None   Collection Time: 10/30/15  1:48 PM  Result Value Ref Range   ABO/RH(D) A POS    Antibody Screen NEG    Sample Expiration 11/02/2015   Urinalysis, Routine w reflex microscopic (not at [facility])     Status: None   Collection Time: 10/30/15  1:49 PM  Result Value Ref Range   Color, Urine YELLOW YELLOW   APPearance CLEAR CLEAR   Specific Gravity, Urine 1.022 1.005 - 1.030   pH 5.5 5.0 - 8.0   Glucose, UA NEGATIVE NEGATIVE mg/dL   Hgb urine dipstick NEGATIVE NEGATIVE   Bilirubin Urine NEGATIVE NEGATIVE   Ketones, ur NEGATIVE NEGATIVE mg/dL   Protein, ur NEGATIVE NEGATIVE mg/dL   Nitrite NEGATIVE NEGATIVE   Leukocytes, UA NEGATIVE NEGATIVE    Comment: MICROSCOPIC NOT DONE ON URINES WITH NEGATIVE PROTEIN, BLOOD, LEUKOCYTES, NITRITE, OR GLUCOSE <1000 mg/dL.  Aerobic/Anaerobic Culture (surgical/deep wound)     Status: None (Preliminary result)   Collection Time: 10/30/15  4:33 PM  Result Value Ref Range  Specimen Description TISSUE    Special Requests LEFT KNEE SYNOVITIS IN CUP SPECIMEN A    Gram Stain      MODERATE WBC PRESENT,BOTH PMN AND MONONUCLEAR NO ORGANISMS SEEN    Culture PENDING    Report Status PENDING   CBC     Status:  Abnormal   Collection Time: 10/31/15  5:05 AM  Result Value Ref Range   WBC 15.4 (H) 4.0 - 10.5 K/uL   RBC 4.15 (L) 4.22 - 5.81 MIL/uL   Hemoglobin 10.8 (L) 13.0 - 17.0 g/dL    Comment: REPEATED TO VERIFY DELTA CHECK NOTED    HCT 34.2 (L) 39.0 - 52.0 %   MCV 82.4 78.0 - 100.0 fL   MCH 26.0 26.0 - 34.0 pg   MCHC 31.6 30.0 - 36.0 g/dL   RDW 15.1 11.5 - 15.5 %   Platelets 241 150 - 400 K/uL  Basic metabolic panel     Status: Abnormal   Collection Time: 10/31/15  5:05 AM  Result Value Ref Range   Sodium 136 135 - 145 mmol/L   Potassium 4.8 3.5 - 5.1 mmol/L   Chloride 102 101 - 111 mmol/L   CO2 24 22 - 32 mmol/L   Glucose, Bld 165 (H) 65 - 99 mg/dL   BUN 17 6 - 20 mg/dL   Creatinine, Ser 1.48 (H) 0.61 - 1.24 mg/dL   Calcium 8.8 (L) 8.9 - 10.3 mg/dL   GFR calc non Af Amer 52 (L) >60 mL/min   GFR calc Af Amer >60 >60 mL/min    Comment: (NOTE) The eGFR has been calculated using the CKD EPI equation. This calculation has not been validated in all clinical situations. eGFR's persistently <60 mL/min signify possible Chronic Kidney Disease.    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 Penicillins Other (See Comments)    Received ancef 3gms with no obvious reaction. 08/08/15 Has patient had a PCN reaction causing immediate rash, facial/tongue/throat swelling, SOB or lightheadedness with hypotension: Unknown, childhood reaction Has patient had a PCN reaction causing severe rash involving mucus membranes or skin necrosis: No Has patient had a PCN reaction that required hospitalization No Has patient had a PCN reaction occurring within the last 10 years: No If all of the above answers are "NO", then may proceed with Ce    Discharge antibiotics: Vancomycin Per pharmacy  protocol Vancomycin Aim for Vancomycin trough 15-20 (unless otherwise indicated)  Duration:  6 weeks postoperatively  End Date:  [place] 19th 2017  [facility] Care Per Protocol:  BIWEEKLY LABS while on IV antibiotics  _x_BMP  weekly while on IV antibiotics:  _x_ CBC with differential _x_ CRP _x_ ESR _x_ Vancomycin trough  _x_ Please pull PIC at completion of IV antibiotics __ Please leave PIC in place until doctor has seen patient or been notified  Fax weekly labs to [phone]  Clinic Follow Up Appt:  ID pharmacy next 2 weeks ID MD next 5 weeks  #2 Screening: check HIV and hep B, C    10/31/2015, 3:32 PM   Thank you so much for this interesting consult  [place] for [place] [phone] (pager) [phone] (office) 10/31/2015, 3:32 PM  Rhina [place] 10/31/2015, 3:32 PM

## 2015-11-01 DIAGNOSIS — R2681 Unsteadiness on feet: Secondary | ICD-10-CM

## 2015-11-01 LAB — CBC
HCT: 28.4 % — ABNORMAL LOW (ref 39.0–52.0)
Hemoglobin: 9 g/dL — ABNORMAL LOW (ref 13.0–17.0)
MCH: 25.7 pg — ABNORMAL LOW (ref 26.0–34.0)
MCHC: 31.7 g/dL (ref 30.0–36.0)
MCV: 81.1 fL (ref 78.0–100.0)
Platelets: 156 10*3/uL (ref 150–400)
RBC: 3.5 MIL/uL — ABNORMAL LOW (ref 4.22–5.81)
RDW: 15.1 % (ref 11.5–15.5)
WBC: 12.4 10*3/uL — ABNORMAL HIGH (ref 4.0–10.5)

## 2015-11-01 LAB — BASIC METABOLIC PANEL
Anion gap: 6 (ref 5–15)
BUN: 14 mg/dL (ref 6–20)
CO2: 26 mmol/L (ref 22–32)
Calcium: 8.6 mg/dL — ABNORMAL LOW (ref 8.9–10.3)
Chloride: 102 mmol/L (ref 101–111)
Creatinine, Ser: 1.3 mg/dL — ABNORMAL HIGH (ref 0.61–1.24)
GFR calc Af Amer: >60 mL/min (ref >60)
GFR calc non Af Amer: >60 mL/min (ref >60)
Glucose, Bld: 123 mg/dL — ABNORMAL HIGH (ref 65–99)
Potassium: 4.2 mmol/L (ref 3.5–5.1)
Sodium: 134 mmol/L — ABNORMAL LOW (ref 135–145)

## 2015-11-01 LAB — SEDIMENTATION RATE: Sed Rate: 70 mm/hr — ABNORMAL HIGH (ref 0–16)

## 2015-11-01 LAB — HIV ANTIBODY (ROUTINE TESTING W REFLEX): HIV Screen 4th Generation wRfx: NONREACTIVE

## 2015-11-01 LAB — C-REACTIVE PROTEIN: CRP: 18 mg/dL — ABNORMAL HIGH (ref <1.0)

## 2015-11-01 MED ORDER — OXYCODONE-ACETAMINOPHEN 5-325 MG PO TABS: 1.0000 | ORAL_TABLET | ORAL | 0 refills | Status: DC | PRN

## 2015-11-01 MED ORDER — VANCOMYCIN HCL IN DEXTROSE 1-5 GM/200ML-% IV SOLN
1000.0000 mg | Freq: Two times a day (BID) | INTRAVENOUS | Status: DC
  Administered 2015-11-01 – 2015-11-02 (×3): 1000 mg via INTRAVENOUS
  Filled 2015-11-01 (×5): qty 200

## 2015-11-01 NOTE — Evaluation (Signed)
Physical Therapy Evaluation Patient Details Name: Richard [name] MRN: [id number] DOB: 1960/08/02 Today's Date: 11/01/2015   History of Present Illness  55 y.o. male s/p L knee I&D, TKA hardware removal and spacer placement.  Clinical Impression  Mod progress with mobility.  Pt continues to struggle with transfers supine<>sit but increased tolerance to ambulation.    Follow Up Recommendations Home health PT;Supervision - Intermittent    Equipment Recommendations  None recommended by PT    Recommendations for Other Services OT consult     Precautions / Restrictions Precautions Precautions: Knee Required Braces or Orthoses: Knee Immobilizer - Left Knee Immobilizer - Left: On at all times Restrictions Weight Bearing Restrictions: Yes LLE Weight Bearing: Partial weight bearing LLE Partial Weight Bearing Percentage or Pounds: 50% Other Position/Activity Restrictions: encourage AROM L knee per MD PN 10/31/15.      Mobility  Bed Mobility Overal bed mobility: Needs Assistance Bed Mobility: Supine to Sit;Sit to Supine     Supine to sit: HOB elevated;Min assist;Mod assist     General bed mobility comments: physical assist to manage L LE and to bring trunk to upright  Transfers Overall transfer level: Needs assistance Equipment used: Rolling walker (2 wheeled) Transfers: Sit to/from Stand Sit to Stand: Min assist         General transfer comment: increased time to power up and attain full upright stance, verbal cues for hand placement and sequencing  Ambulation/Gait Ambulation/Gait assistance: Min assist Ambulation Distance (Feet): 26 Feet Assistive device: Rolling walker (2 wheeled) Gait Pattern/deviations: Step-to pattern;Decreased step length - right;Decreased step length - left;Shuffle;Trunk flexed Gait velocity: decreased Gait velocity interpretation: Below normal speed for age/gender General Gait Details: cues for sequence, posture and position from RW;  distance ltd by UE fatigue  Stairs            Wheelchair Mobility    Modified Rankin (Stroke Patients Only)       Balance                                             Pertinent Vitals/Pain Pain Assessment: 0-10 Pain Score: 6  Pain Location: L knee Pain Descriptors / Indicators: Aching;Sore Pain Intervention(s): Limited activity within patient's tolerance;Monitored during session;Premedicated before session;Ice applied    Home Living                        Prior Function                 Hand Dominance        Extremity/Trunk Assessment                         Communication      Cognition Arousal/Alertness: Awake/alert Behavior During Therapy: WFL for tasks assessed/performed Overall Cognitive Status: Within Functional Limits for tasks assessed                      General Comments      Exercises Total Joint Exercises Ankle Circles/Pumps: AROM;Both;10 reps   Assessment/Plan    PT Assessment    PT Problem List            PT Treatment Interventions      PT Goals (Current goals can be found in the Care Plan section)  Acute Rehab PT Goals Patient  Stated Goal: to go home PT Goal Formulation: With patient Time For Goal Achievement: 11/14/15 Potential to Achieve Goals: Good    Frequency 7X/week   Barriers to discharge        Co-evaluation               End of Session Equipment Utilized During Treatment: Gait belt;Left knee immobilizer Activity Tolerance: Patient limited by fatigue Patient left: in chair;with call bell/phone within reach;with family/visitor present Nurse Communication: Mobility status         Time: [id number] PT Time Calculation (min) (ACUTE ONLY): 22 min   Charges:     PT Treatments $Gait Training: 8-22 mins   PT G Codes:        Darothy Courtright 11/07/15, 1:42 PM

## 2015-11-01 NOTE — Care Management (Signed)
Case manager received call from [name] provider service stating they have staffed for home health through [place] for RN/ PT. Case manager inquired about IV antibiotics and was informed that they will have to get back to me because they didn't see that on order. CM informed Cherlyn Cushing that it is clearly indicated patient is goping home on IV antibiotics and that we preferrably use [facility], as stated on yesterday's call. CM informed that they will return call concerning this.

## 2015-11-01 NOTE — Progress Notes (Addendum)
Pharmacy Antibiotic Note  Richard [name] is a 55 y.o. male admitted on 10/30/2015 with infected total joint.  Pharmacy has been consulted for vancomycin dosing.  He is s/p I&D Left knee with poly exchange spacers POD #2. Coagulase neg staph prosthetic joint infection s/p resection of arthroplasty.  PICC placed for 6 weeks home abx per ID consult.  Vanc 1 gm 10/9 at 1342 and vanc 1 gm 10/10 at 0611 am.  Creat 1.19>1.48. Wt 121.6 kg. Normalized creat cl 58 ml/min. WBC 12.4, Sed rate 70, CRP 18.   Plan: order BMET now to f/u creatinine = 1.3 normalized creat cl = 66 Vancomycin 1000 IV every 12 hours.  Goal trough 15-20 mcg/mL.  OP labs per ID note 10/10  Weight: 268 lb (121.6 kg)  Temp (24hrs), Avg:98.6 F (37 C), Min:98.3 F (36.8 C), Max:98.8 F (37.1 C)   Recent Labs Lab 10/30/15 1348 10/31/15 0505 11/01/15 0415  WBC 8.9 15.4* 12.4*  CREATININE 1.19 1.48*  --     Estimated Creatinine Clearance: 73.5 mL/min (by C-G formula based on SCr of 1.48 mg/dL (H)).    Allergies  Allergen Reactions  . Penicillins Other (See Comments)    Received ancef 3gms with no obvious reaction. 08/08/15 Has patient had a PCN reaction causing immediate rash, facial/tongue/throat swelling, SOB or lightheadedness with hypotension: Unknown, childhood reaction Has patient had a PCN reaction causing severe rash involving mucus membranes or skin necrosis: No Has patient had a PCN reaction that required hospitalization No Has patient had a PCN reaction occurring within the last 10 years: No If all of the above answers are "NO", then may proceed with Ce    Antimicrobials this admission:  vanc 10/9>>  Dose adjustments this admission:  Microbiology results: 10/3 L knee>>MRCNS 10/9 L knee tissue>> 10/11 Hep C 10/11 HIV  Thank you for allowing pharmacy to be a part of this patient's care.  Eudelia Bunch, Pharm.D. [phone] 11/01/2015 8:18 AM

## 2015-11-01 NOTE — Progress Notes (Signed)
Subjective: No new complaints   Antibiotics:  Anti-infectives    Start     Dose/Rate Route Frequency Ordered Stop   11/01/15 0800  vancomycin (VANCOCIN) IVPB 1000 mg/200 mL premix     1,000 mg 200 mL/hr over 60 Minutes Intravenous Every 12 hours 11/01/15 0754     10/31/15 0600  vancomycin (VANCOCIN) IVPB 1000 mg/200 mL premix     1,000 mg 200 mL/hr over 60 Minutes Intravenous On call to O.R. 10/30/15 1250 10/30/15 1442   10/31/15 0600  vancomycin (VANCOCIN) IVPB 1000 mg/200 mL premix     1,000 mg 200 mL/hr over 60 Minutes Intravenous Every 12 hours 10/30/15 2118 10/31/15 0711   10/30/15 1627  vancomycin (VANCOCIN) powder  Status:  Discontinued       As needed 10/30/15 1627 10/30/15 1901   10/30/15 1626  tobramycin (NEBCIN) powder  Status:  Discontinued       As needed 10/30/15 1626 10/30/15 1901      Medications: Scheduled Meds: . apixaban  5 mg Oral BID  . docusate sodium  100 mg Oral BID  . metoprolol succinate  25 mg Oral BID PC  . pantoprazole  80 mg Oral Q1200  . vancomycin  1,000 mg Intravenous Q12H   Continuous Infusions: . dextrose 5 % and 0.45 % NaCl with KCl 20 mEq/L 125 mL/hr at 10/30/15 2248   PRN Meds:.acetaminophen **OR** acetaminophen, alum & mag hydroxide-simeth, bisacodyl, diphenhydrAMINE, HYDROmorphone (DILAUDID) injection, menthol-cetylpyridinium **OR** phenol, methocarbamol **OR** methocarbamol (ROBAXIN)  IV, metoCLOPramide **OR** metoCLOPramide (REGLAN) injection, ondansetron **OR** ondansetron (ZOFRAN) IV, oxyCODONE, senna-docusate, sodium chloride flush, sodium phosphate    Objective: Weight change:   Intake/Output Summary (Last 24 hours) at 11/01/15 1255 Last data filed at 11/01/15 1014  Gross per 24 hour  Intake              440 ml  Output              725 ml  Net             -285 ml   Blood pressure 114/68, pulse 85, temperature 100 F (37.8 C), temperature source Oral, resp. rate 16, weight 268 lb (121.6 kg), SpO2 97 %. Temp:   [98.3 F (36.8 C)-100 F (37.8 C)] 100 F (37.8 C) (10/11 1246) Pulse Rate:  [77-86] 85 (10/11 1246) Resp:  [16-18] 16 (10/11 1246) BP: (114-128)/(58-68) 114/68 (10/11 1246) SpO2:  [94 %-98 %] 97 % (10/11 1246)  Physical Exam: General: Alert and awake, oriented x3, not in any acute distress. HEENT: anicteric sclera,  EOMI, oropharynx clear and without exudate Cardiovascular: regular rate, normal r,  no murmur rubs or gallops Pulmonary: clear to auscultation bilaterally, no wheezing, rales or rhonchi Gastrointestinal: soft nontender, nondistended, normal bowel sounds, Musculoskeletal: Knee wrapped Skin, soft tissue: no rashes Neuro: nonfocal, strength and sensation intact  CBC:  CBC Latest Ref Rng & Units 11/01/2015 10/31/2015 10/30/2015  WBC 4.0 - 10.5 K/uL 12.4(H) 15.4(H) 8.9  Hemoglobin 13.0 - 17.0 g/dL 9.0(L) 10.8(L) 14.3  Hematocrit 39.0 - 52.0 % 28.4(L) 34.2(L) 44.8  Platelets 150 - 400 K/uL 156 241 264      BMET  Recent Labs  10/31/15 0505 11/01/15 0845  NA 136 134*  K 4.8 4.2  CL 102 102  CO2 24 26  GLUCOSE 165* 123*  BUN 17 14  CREATININE 1.48* 1.30*  CALCIUM 8.8* 8.6*     Liver Panel  No results for input(s): PROT,  ALBUMIN, AST, ALT, ALKPHOS, BILITOT, BILIDIR, IBILI in the last 72 hours.     Sedimentation Rate  Recent Labs  11/01/15 0415  ESRSEDRATE 70*   C-Reactive Protein  Recent Labs  11/01/15 0415  CRP 18.0*    Micro Results: Recent Results (from the past 720 hour(s))  Aerobic/Anaerobic Culture (surgical/deep wound)     Status: None (Preliminary result)   Collection Time: 10/30/15  4:33 PM  Result Value Ref Range Status   Specimen Description TISSUE  Final   Special Requests LEFT KNEE SYNOVITIS IN CUP SPECIMEN A  Final   Gram Stain   Final    MODERATE WBC PRESENT,BOTH PMN AND MONONUCLEAR NO ORGANISMS SEEN    Culture NO GROWTH 1 DAY  Final   Report Status PENDING  Incomplete    Studies/Results: Dg Chest 2 View  Result  Date: 10/30/2015 CLINICAL DATA:  Preoperative examination prior to knee prosthesis removal due to infection. EXAM: CHEST  2 VIEW COMPARISON:  Chest x-ray of August 19, 2015 FINDINGS: The lungs are well-expanded and clear. The heart and pulmonary vascularity are normal. The mediastinum is normal in width. There is no pleural effusion. There is multilevel degenerative disc disease of the thoracic spine. IMPRESSION: There is no active cardiopulmonary disease. Electronically Signed   By: David  Martinique M.D.   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 Penicillins Other (See Comments)    Received ancef 3gms with no obvious reaction. 08/08/15 Has patient had a PCN reaction causing immediate rash, facial/tongue/throat swelling, SOB or lightheadedness with hypotension: Unknown, childhood reaction Has patient had a PCN reaction causing severe rash involving mucus membranes or skin necrosis: No Has patient had a PCN reaction that required hospitalization No Has patient had a PCN reaction occurring within the last 10 years: No If all of the above answers are "NO", then may proceed with Ce    Discharge antibiotics: Vancomycin Per pharmacy protocol  Vancomycin Aim for Vancomycin trough 15-20 (unless otherwise indicated)  Duration:  6 weeks postoperatively  End Date:  [place] 19th 2017  [place] Per Protocol:  BIWEEKLY LABS while on IV antibiotics  _x_BMP  weekly while on IV antibiotics:  _x_ CBC with differential _x_ CRP _x_ ESR _x_ Vancomycin trough  _x_ Please pull PIC at completion of IV antibiotics __ Please leave PIC in place until doctor has seen patient or been notified  Fax weekly labs to [phone]  Clinic Follow Up Appt:  ID pharmacy next 2 weeks ID MD next 5 weeks  #2 Screening: check HIV and hep B, C    LOS: 2 days   Alcide Evener 11/01/2015, 12:55 PM

## 2015-11-01 NOTE — Discharge Instructions (Addendum)
INSTRUCTIONS AFTER JOINT REPLACEMENT  ° °o Remove items at home which could result in a fall. This includes throw rugs or furniture in walking pathways °o ICE to the affected joint every three hours while awake for 30 minutes at a time, for at least the first 3-5 days, and then as needed for pain and swelling.  Continue to use ice for pain and swelling. You may notice swelling that will progress down to the foot and ankle.  This is normal after surgery.  Elevate your leg when you are not up walking on it.   °o Continue to use the breathing machine you got in the hospital (incentive spirometer) which will help keep your temperature down.  It is common for your temperature to cycle up and down following surgery, especially at night when you are not up moving around and exerting yourself.  The breathing machine keeps your lungs expanded and your temperature down. ° ° °DIET:  As you were doing prior to hospitalization, we recommend a well-balanced diet. ° °DRESSING / WOUND CARE / SHOWERING ° °You may change your dressing 3-5 days after surgery.  Then change the dressing every day with sterile gauze.  Please use good hand washing techniques before changing the dressing.  Do not use any lotions or creams on the incision until instructed by your surgeon. ° °ACTIVITY ° °o Increase activity slowly as tolerated, but follow the weight bearing instructions below.   °o No driving for 6 weeks or until further direction given by your physician.  You cannot drive while taking narcotics.  °o No lifting or carrying greater than 10 lbs. until further directed by your surgeon. °o Avoid periods of inactivity such as sitting longer than an hour when not asleep. This helps prevent blood clots.  °o You may return to work once you are authorized by your doctor.  ° ° ° °WEIGHT BEARING  ° °Weight bearing as tolerated with assist device (walker, cane, etc) as directed, use it as long as suggested by your surgeon or therapist, typically at  least 4-6 weeks. ° ° °EXERCISES ° °Results after joint replacement surgery are often greatly improved when you follow the exercise, range of motion and muscle strengthening exercises prescribed by your doctor. Safety measures are also important to protect the joint from further injury. Any time any of these exercises cause you to have increased pain or swelling, decrease what you are doing until you are comfortable again and then slowly increase them. If you have problems or questions, call your caregiver or physical therapist for advice.  ° °Rehabilitation is important following a joint replacement. After just a few days of immobilization, the muscles of the leg can become weakened and shrink (atrophy).  These exercises are designed to build up the tone and strength of the thigh and leg muscles and to improve motion. Often times heat used for twenty to thirty minutes before working out will loosen up your tissues and help with improving the range of motion but do not use heat for the first two weeks following surgery (sometimes heat can increase post-operative swelling).  ° °These exercises can be done on a training (exercise) mat, on the floor, on a table or on a bed. Use whatever works the best and is most comfortable for you.    Use music or television while you are exercising so that the exercises are a pleasant break in your day. This will make your life better with the exercises acting as a break   in your routine that you can look forward to.   Perform all exercises about fifteen times, three times per day or as directed.  You should exercise both the operative leg and the other leg as well. ° °Exercises include: °  °• Quad Sets - Tighten up the muscle on the front of the thigh (Quad) and hold for 5-10 seconds.   °• Straight Leg Raises - With your knee straight (if you were given a brace, keep it on), lift the leg to 60 degrees, hold for 3 seconds, and slowly lower the leg.  Perform this exercise against  resistance later as your leg gets stronger.  °• Leg Slides: Lying on your back, slowly slide your foot toward your buttocks, bending your knee up off the floor (only go as far as is comfortable). Then slowly slide your foot back down until your leg is flat on the floor again.  °• Angel Wings: Lying on your back spread your legs to the side as far apart as you can without causing discomfort.  °• Hamstring Strength:  Lying on your back, push your heel against the floor with your leg straight by tightening up the muscles of your buttocks.  Repeat, but this time bend your knee to a comfortable angle, and push your heel against the floor.  You may put a pillow under the heel to make it more comfortable if necessary.  ° °A rehabilitation program following joint replacement surgery can speed recovery and prevent re-injury in the future due to weakened muscles. Contact your doctor or a physical therapist for more information on knee rehabilitation.  ° ° °CONSTIPATION ° °Constipation is defined medically as fewer than three stools per week and severe constipation as less than one stool per week.  Even if you have a regular bowel pattern at home, your normal regimen is likely to be disrupted due to multiple reasons following surgery.  Combination of anesthesia, postoperative narcotics, change in appetite and fluid intake all can affect your bowels.  ° °YOU MUST use at least one of the following options; they are listed in order of increasing strength to get the job done.  They are all available over the counter, and you may need to use some, POSSIBLY even all of these options:   ° °Drink plenty of fluids (prune juice may be helpful) and high fiber foods °Colace 100 mg by mouth twice a day  °Senokot for constipation as directed and as needed Dulcolax (bisacodyl), take with full glass of water  °Miralax (polyethylene glycol) once or twice a day as needed. ° °If you have tried all these things and are unable to have a bowel  movement in the first 3-4 days after surgery call either your surgeon or your primary doctor.   ° °If you experience loose stools or diarrhea, hold the medications until you stool forms back up.  If your symptoms do not get better within 1 week or if they get worse, check with your doctor.  If you experience "the worst abdominal pain ever" or develop nausea or vomiting, please contact the office immediately for further recommendations for treatment. ° ° °ITCHING:  If you experience itching with your medications, try taking only a single pain pill, or even half a pain pill at a time.  You can also use Benadryl over the counter for itching or also to help with sleep.  ° °TED HOSE STOCKINGS:  Use stockings on both legs until for at least 2 weeks or as   directed by physician office. They may be removed at night for sleeping.  MEDICATIONS:  See your medication summary on the After Visit Summary that nursing will review with you.  You may have some home medications which will be placed on hold until you complete the course of blood thinner medication.  It is important for you to complete the blood thinner medication as prescribed.  PRECAUTIONS:  If you experience chest pain or shortness of breath - call 911 immediately for transfer to the hospital emergency department.   If you develop a fever greater that 101 F, purulent drainage from wound, increased redness or drainage from wound, foul odor from the wound/dressing, or calf pain - CONTACT YOUR SURGEON.                                                   FOLLOW-UP APPOINTMENTS:  If you do not already have a post-op appointment, please call the office for an appointment to be seen by your surgeon.  Guidelines for how soon to be seen are listed in your After Visit Summary, but are typically between 1-4 weeks after surgery.  OTHER INSTRUCTIONS:   Knee Replacement:  Do not place pillow under knee, focus on keeping the knee straight while resting. CPM  instructions: 0-90 degrees, 2 hours in the morning, 2 hours in the afternoon, and 2 hours in the evening. Place foam block, curve side up under heel at all times except when in CPM or when walking.  DO NOT modify, tear, cut, or change the foam block in any way.  MAKE SURE YOU:   Understand these instructions.   Get help right away if you are not doing well or get worse.    Thank you for letting us be a part of your medical care team.  It is a privilege we respect greatly.  We hope these instructions will help you stay on track for a fast and full recovery!    Information on my medicine - ELIQUIS (apixaban)  This medication education was reviewed with me or my healthcare representative as part of my discharge preparation.  The pharmacist that spoke with me during my hospital stay was:  Eudelia Bunch, [facility]  Why was Eliquis prescribed for you? Eliquis was prescribed for you to reduce the risk of blood clots forming after orthopedic surgery.    What do You need to know about Eliquis? Take your Eliquis TWICE DAILY - one tablet in the morning and one tablet in the evening with or without food.  It would be best to take the dose about the same time each day.  If you have difficulty swallowing the tablet whole please discuss with your pharmacist how to take the medication safely.  Take Eliquis exactly as prescribed by your doctor and DO NOT stop taking Eliquis without talking to the doctor who prescribed the medication.  Stopping without other medication to take the place of Eliquis may increase your risk of developing a clot.  After discharge, you should have regular check-up appointments with your healthcare provider that is prescribing your Eliquis.  What do you do if you miss a dose? If a dose of ELIQUIS is not taken at the scheduled time, take it as soon as possible on the same day and twice-daily administration should be resumed.  The dose should not be  be doubled to make up for  a missed dose.  Do not take more than one tablet of ELIQUIS at the same time. ° °Important Safety Information °A possible side effect of Eliquis® is bleeding. You should call your healthcare provider right away if you experience any of the following: °? Bleeding from an injury or your nose that does not stop. °? Unusual colored urine (red or dark brown) or unusual colored stools (red or black). °? Unusual bruising for unknown reasons. °? A serious fall or if you hit your head (even if there is no bleeding). ° °Some medicines may interact with Eliquis® and might increase your risk of bleeding or clotting while on Eliquis®. To help avoid this, consult your healthcare provider or pharmacist prior to using any new prescription or non-prescription medications, including herbals, vitamins, non-steroidal anti-inflammatory drugs (NSAIDs) and supplements. ° °This website has more information on Eliquis® (apixaban): http://www.eliquis.com/eliquis/home ° °

## 2015-11-01 NOTE — Progress Notes (Signed)
PATIENT ID: Richard [name]  MRN: [id number]  DOB/AGE:  1960/12/14 / 55 y.o.  2 Days Post-Op Procedure(s) (LRB): IRRIGATION AND DEBRIDEMENT KNEE WITH POLY EXCHANGE PLACE SPACERS (Left)    PROGRESS NOTE Subjective: Patient is alert, oriented, no Nausea, no Vomiting, yes passing gas. Taking PO with small bites. Denies SOB, Chest or Calf Pain. Using Incentive Spirometer, PAS in place. Ambulate WBAT with pt walking 3 ft with therapy, No CPM Patient reports pain as 8/10 .    Objective: Vital signs in last 24 hours: Vitals:   10/31/15 1226 10/31/15 2010 10/31/15 2347 11/01/15 0400  BP: 128/63 (!) 128/58  (!) 114/59  Pulse: 90 80 86 77  Resp: 18 17 18 18   Temp: 98.8 F (37.1 C) 98.3 F (36.8 C)  98.6 F (37 C)  TempSrc: Oral Oral  Oral  SpO2: 96% 94% 96% 98%  Weight:          Intake/Output from previous day: I/O last 3 completed shifts: In: 1480 [P.O.:480; I.V.:1000] Out: 1800 [Urine:1125; Drains:675]   Intake/Output this shift: No intake/output data recorded.   LABORATORY DATA:  Recent Labs  10/30/15 1348 10/31/15 0505 11/01/15 0415  WBC 8.9 15.4* 12.4*  HGB 14.3 10.8* 9.0*  HCT 44.8 34.2* 28.4*  PLT 264 241 156  NA 139 136  --   K 4.4 4.8  --   CL 108 102  --   CO2 23 24  --   BUN 15 17  --   CREATININE 1.19 1.48*  --   GLUCOSE 82 165*  --   INR 1.10  --   --   CALCIUM 9.5 8.8*  --     Examination: Neurologically intact Neurovascular intact Sensation intact distally Intact pulses distally Dorsiflexion/Plantar flexion intact Incision: dressing C/D/I and no drainage No cellulitis present Compartment soft} Blood and plasma separated in drain indicating minimal recent drainage, drain pulled without difficulty.  Assessment:   2 Days Post-Op Procedure(s) (LRB): IRRIGATION AND DEBRIDEMENT KNEE WITH POLY EXCHANGE PLACE SPACERS (Left) ADDITIONAL DIAGNOSIS: Expected Acute Blood Loss Anemia, history of bilateral pulmonary embolism last year after total knee,  history of paroxysmal atrial fibrillation, severe sleep apnea, morbid obesity, Left ventricular hypertrophy.  Plan: 1.  Infectious diseases consult to manage IV antibiotics, tentatively plan 6 wks. 2.  We'll restart full dose Eliquis 5mg  PO Bid, today as patient has history of bilateral pulmonary emboli after his total knee last year. 3.  The patient is 50% partial weightbearing on the left lower extremity, active range of motion is encouraged, knee immobilizer to work on extension as he had a 30 flexion contracture preoperatively.  No CPM machine.  DISCHARGE PLAN: Home DISCHARGE NEEDS: HHPT, Walker, 3-in-1 comode seat and IV Antibiotics     Nekeya Briski R 11/01/2015, 7:55 AM

## 2015-11-01 NOTE — Progress Notes (Signed)
Physical Therapy Treatment Patient Details Name: Richard [name] MRN: [id number] DOB: August 07, 1960 Today's Date: 11/01/2015    History of Present Illness 55 y.o. male s/p L knee I&D, TKA hardware removal and spacer placement.    PT Comments    Pt progressing well with mobility this pm and hopeful for dc by end of week.  Will consider stairs tomorrow.  Follow Up Recommendations  Home health PT;Supervision - Intermittent     Equipment Recommendations  None recommended by PT    Recommendations for Other Services OT consult     Precautions / Restrictions Precautions Precautions: Knee Required Braces or Orthoses: Knee Immobilizer - Left Knee Immobilizer - Left: On at all times Restrictions Weight Bearing Restrictions: Yes LLE Weight Bearing: Partial weight bearing LLE Partial Weight Bearing Percentage or Pounds: 50% Other Position/Activity Restrictions: encourage AROM L knee per MD PN 10/31/15.    Mobility  Bed Mobility Overal bed mobility: Needs Assistance Bed Mobility: Supine to Sit     Supine to sit: Min assist;HOB elevated     General bed mobility comments: Cues for sequence and physical assist to manage L LE  Transfers Overall transfer level: Needs assistance Equipment used: Rolling walker (2 wheeled) Transfers: Sit to/from Stand Sit to Stand: Min guard         General transfer comment: Cues for LE management and use of UES to self assist  Ambulation/Gait Ambulation/Gait assistance: Min assist;Min guard Ambulation Distance (Feet): 76 Feet Assistive device: Rolling walker (2 wheeled) Gait Pattern/deviations: Step-to pattern;Shuffle;Trunk flexed Gait velocity: decreased Gait velocity interpretation: Below normal speed for age/gender General Gait Details: cues for sequence, posture and position from RW; distance ltd by UE fatigue   Stairs            Wheelchair Mobility    Modified Rankin (Stroke Patients Only)       Balance                                     Cognition Arousal/Alertness: Awake/alert Behavior During Therapy: WFL for tasks assessed/performed Overall Cognitive Status: Within Functional Limits for tasks assessed                      Exercises Total Joint Exercises Ankle Circles/Pumps: AROM;Both;15 reps;Supine Quad Sets: AROM;Left;10 reps;Supine Heel Slides: AROM;Left;10 reps;Supine Straight Leg Raises: AAROM;Left;10 reps;Supine (KI in place)    General Comments        Pertinent Vitals/Pain Pain Assessment: 0-10 Pain Score: 5  Pain Location: L knee/thigh Pain Descriptors / Indicators: Aching;Sore Pain Intervention(s): Limited activity within patient's tolerance;Monitored during session;Premedicated before session    Home Living                      Prior Function            PT Goals (current goals can now be found in the care plan section) Acute Rehab PT Goals Patient Stated Goal: to go home PT Goal Formulation: With patient Time For Goal Achievement: 11/14/15 Potential to Achieve Goals: Good Progress towards PT goals: Progressing toward goals    Frequency    7X/week      PT Plan Current plan remains appropriate    Co-evaluation             End of Session Equipment Utilized During Treatment: Gait belt;Left knee immobilizer Activity Tolerance: Patient limited by fatigue Patient left: in  chair;with call bell/phone within reach;with family/visitor present     Time: 1530-1556 PT Time Calculation (min) (ACUTE ONLY): 26 min  Charges:  $Gait Training: 8-22 mins $Therapeutic Exercise: 8-22 mins                    G Codes:      Maecy Podgurski 2015/11/09, 4:54 PM

## 2015-11-01 NOTE — Progress Notes (Signed)
[facility]  Patient Status:New pt with [facility] this admission  Spoke with Verdis Frederickson at [name],payer for pt. [facility] will be providing home infusion pharmacy services for IV ABX at home as well as [facility] for home health care services in the home.  [facility] is prepared for DC when ordered  by MD team.   If patient discharges after hours, please call [phone].   Larry Sierras 11/01/2015, 4:21 PM

## 2015-11-01 NOTE — Progress Notes (Signed)
Pt. able to place self on/off CPAP on own, humidity filled earlier this shift, pt. on room air, tolerating well.

## 2015-11-02 LAB — HEPATITIS B SURFACE ANTIGEN: Hepatitis B Surface Ag: NEGATIVE

## 2015-11-02 LAB — CBC
HCT: 26.1 % — ABNORMAL LOW (ref 39.0–52.0)
Hemoglobin: 8.3 g/dL — ABNORMAL LOW (ref 13.0–17.0)
MCH: 25.8 pg — ABNORMAL LOW (ref 26.0–34.0)
MCHC: 31.8 g/dL (ref 30.0–36.0)
MCV: 81.1 fL (ref 78.0–100.0)
Platelets: 178 10*3/uL (ref 150–400)
RBC: 3.22 MIL/uL — ABNORMAL LOW (ref 4.22–5.81)
RDW: 14.9 % (ref 11.5–15.5)
WBC: 10.7 10*3/uL — ABNORMAL HIGH (ref 4.0–10.5)

## 2015-11-02 LAB — HEPATITIS C ANTIBODY (REFLEX): HCV Ab: <0.1 s/co ratio (ref 0.0–0.9)

## 2015-11-02 LAB — HCV COMMENT:

## 2015-11-02 LAB — VANCOMYCIN, TROUGH: Vancomycin Tr: 11 ug/mL — ABNORMAL LOW (ref 15–20)

## 2015-11-02 MED ORDER — VANCOMYCIN HCL 10 G IV SOLR
1250.0000 mg | Freq: Two times a day (BID) | INTRAVENOUS | Status: DC
  Administered 2015-11-03 (×2): 1250 mg via INTRAVENOUS
  Filled 2015-11-02 (×4): qty 1250

## 2015-11-02 NOTE — Progress Notes (Signed)
Pt. already had CPAP on upon my arrival, family at bedside, no c/o and tolerating well.

## 2015-11-02 NOTE — Progress Notes (Signed)
Physical Therapy Treatment Patient Details Name: Richard [name] MRN: [id number] DOB: 04/13/1960 Today's Date: 11/02/2015    History of Present Illness 55 y.o. male s/p L knee I&D, TKA hardware removal and spacer placement.    PT Comments    Pt slow with mobilization today and with limited ambulation tolerance due to reports of fatigue. PT to continue to follow and attempt to increase activity tolerance in anticipation of D/C to home with family support. Pt will need to attempt stairs prior to D/C.   Follow Up Recommendations  Home health PT;Supervision for mobility/OOB     Equipment Recommendations  None recommended by PT    Recommendations for Other Services       Precautions / Restrictions Precautions Precautions: Knee Required Braces or Orthoses: Knee Immobilizer - Left Knee Immobilizer - Left: On at all times Restrictions Weight Bearing Restrictions: Yes LLE Weight Bearing: Weight bearing as tolerated LLE Partial Weight Bearing Percentage or Pounds: 50% Other Position/Activity Restrictions: order and MD note indicate WBAT    Mobility  Bed Mobility Overal bed mobility: Needs Assistance Bed Mobility: Supine to Sit     Supine to sit: HOB elevated;Mod assist     General bed mobility comments: Assist provided with LLE and trunk to get to sitting EOB.   Transfers Overall transfer level: Needs assistance Equipment used: Rolling walker (2 wheeled) Transfers: Sit to/from Stand Sit to Stand: Min guard         General transfer comment: slow transition to standing  Ambulation/Gait Ambulation/Gait assistance: Min guard Ambulation Distance (Feet): 40 Feet Assistive device: Rolling walker (2 wheeled) Gait Pattern/deviations: Step-to pattern Gait velocity: decreased   General Gait Details: Pt taking 1 standing rest, requesting to sit down during ambulation due to fatigue.    Stairs            Wheelchair Mobility    Modified Rankin (Stroke Patients  Only)       Balance Overall balance assessment: Needs assistance Sitting-balance support: No upper extremity supported Sitting balance-Leahy Scale: Good     Standing balance support: Bilateral upper extremity supported Standing balance-Leahy Scale: Poor Standing balance comment: using rw                    Cognition Arousal/Alertness: Awake/alert Behavior During Therapy: WFL for tasks assessed/performed Overall Cognitive Status: Within Functional Limits for tasks assessed                      Exercises      General Comments        Pertinent Vitals/Pain Pain Assessment: 0-10 Pain Score: 5  Pain Location: Lt knee Pain Descriptors / Indicators: Aching Pain Intervention(s): Limited activity within patient's tolerance;Monitored during session    Home Living                      Prior Function            PT Goals (current goals can now be found in the care plan section) Acute Rehab PT Goals Patient Stated Goal: to go home PT Goal Formulation: With patient Time For Goal Achievement: 11/14/15 Potential to Achieve Goals: Good Progress towards PT goals: Progressing toward goals    Frequency    7X/week      PT Plan Current plan remains appropriate    Co-evaluation             End of Session Equipment Utilized During Treatment: Gait belt;Left knee immobilizer  Activity Tolerance: Patient limited by fatigue Patient left: in chair;with call bell/phone within reach;with family/visitor present     Time: [id number] PT Time Calculation (min) (ACUTE ONLY): 30 min  Charges:  $Gait Training: 23-37 mins                    G Codes:      Cassell Clement, PT, CSCS Pager [phone] Office [phone]  11/02/2015, 1:07 PM

## 2015-11-02 NOTE — Progress Notes (Signed)
Physical Therapy Treatment Patient Details Name: Richard [name] MRN: [id number] DOB: [birth year] Today's Date: 11/02/2015    History of Present Illness 55 y.o. male s/p L knee I&D, TKA hardware removal and spacer placement.    PT Comments    Pt with improved ambulation tolerance during second PT session, going 70 ft with rw. Anticipating that the pt will D/C to home with family assistance. Will need to attempt stairs prior to D/C.   Follow Up Recommendations  Home health PT;Supervision for mobility/OOB     Equipment Recommendations  None recommended by PT    Recommendations for Other Services       Precautions / Restrictions Precautions Precautions: Knee Required Braces or Orthoses: Knee Immobilizer - Left Knee Immobilizer - Left: On at all times Restrictions Weight Bearing Restrictions: Yes LLE Weight Bearing: Weight bearing as tolerated LLE Partial Weight Bearing Percentage or Pounds: 50%    Mobility  Bed Mobility Overal bed mobility: Needs Assistance Bed Mobility: Supine to Sit     Supine to sit: HOB elevated;Mod assist     General bed mobility comments: assist provided with LLE  Transfers Overall transfer level: Needs assistance Equipment used: Rolling walker (2 wheeled) Transfers: Sit to/from Stand Sit to Stand: Min guard         General transfer comment: good stability with standing.   Ambulation/Gait Ambulation/Gait assistance: Min guard Ambulation Distance (Feet): 70 Feet Assistive device: Rolling walker (2 wheeled) Gait Pattern/deviations: Step-to pattern;Decreased stance time - left Gait velocity: decreased   General Gait Details: Pt attempting to minimize weightbearing through LLE.    Stairs            Wheelchair Mobility    Modified Rankin (Stroke Patients Only)       Balance Overall balance assessment: Needs assistance Sitting-balance support: No upper extremity supported Sitting balance-Leahy Scale: Good     Standing  balance support: Bilateral upper extremity supported Standing balance-Leahy Scale: Poor Standing balance comment: using rw                    Cognition Arousal/Alertness: Awake/alert Behavior During Therapy: WFL for tasks assessed/performed Overall Cognitive Status: Within Functional Limits for tasks assessed                      Exercises      General Comments        Pertinent Vitals/Pain Pain Assessment: 0-10 Pain Score: 5  Pain Location: Lt knee and ankle Pain Descriptors / Indicators: Aching Pain Intervention(s): Limited activity within patient's tolerance;Monitored during session;Ice applied    Home Living                      Prior Function            PT Goals (current goals can now be found in the care plan section) Acute Rehab PT Goals Patient Stated Goal: to go home PT Goal Formulation: With patient Time For Goal Achievement: 11/14/15 Potential to Achieve Goals: Good Progress towards PT goals: Progressing toward goals    Frequency    7X/week      PT Plan Current plan remains appropriate    Co-evaluation             End of Session Equipment Utilized During Treatment: Gait belt;Left knee immobilizer Activity Tolerance: Patient tolerated treatment well Patient left: in chair;with call bell/phone within reach;with family/visitor present     Time: [id number] PT Time Calculation (min) (ACUTE  ONLY): 22 min  Charges:  $Gait Training: 8-22 mins                    G Codes:      Cassell Clement, PT, CSCS Pager [phone] Office [phone]  11/02/2015, 4:14 PM

## 2015-11-02 NOTE — Progress Notes (Signed)
Pharmacy Antibiotic Note  Richard [name] is a 55 y.o. male admitted on 10/30/2015 with infected knee joint.  Pharmacy has been consulted for Vancomycin dosing. Vancomycin trough tonight was 11, drawn about 2 hours late, true trough closer to 13, Scr had bumped to 1.48 (back down to 1.3)--monitor closely.   Antimicrobials this admission: Vancomycin 10/9>>  Dose adjustments this admission: Vancomycin trough 10/12>> 11 (drawn 2 hours late--true trough likely closer to 13)--increase vancomycin to 1250 mg IV q12h  Plan: -Increase vancomycin to 1250 mg IV q12h -Re-check vancomycin trough at steady state -Monitor renal function closely   Height: 5\' 9"  (175.3 cm) Weight: 268 lb (121.6 kg) IBW/kg (Calculated) : 70.7  Temp (24hrs), Avg:99.8 F (37.7 C), Min:98.5 F (36.9 C), Max:101 F (38.3 C)   Recent Labs Lab 10/30/15 1348 10/31/15 0505 11/01/15 0415 11/01/15 0845 11/02/15 0416 11/02/15 2150  WBC 8.9 15.4* 12.4*  --  10.7*  --   CREATININE 1.19 1.48*  --  1.30*  --   --   VANCOTROUGH  --   --   --   --   --  11*    Estimated Creatinine Clearance: 83.7 mL/min (by C-G formula based on SCr of 1.3 mg/dL (H)).    Allergies  Allergen Reactions  . Penicillins Other (See Comments)    Received ancef 3gms with no obvious reaction. 08/08/15 Has patient had a PCN reaction causing immediate rash, facial/tongue/throat swelling, SOB or lightheadedness with hypotension: Unknown, childhood reaction Has patient had a PCN reaction causing severe rash involving mucus membranes or skin necrosis: No Has patient had a PCN reaction that required hospitalization No Has patient had a PCN reaction occurring within the last 10 years: No If all of the above answers are "NO", then may proceed with Ce     Narda Bonds 11/02/2015 11:36 PM

## 2015-11-02 NOTE — Progress Notes (Signed)
Occupational Therapy Treatment Patient Details Name: QUAMERE LEMAS MRN: [id number] DOB: 12-Jul-1960 Today's Date: 11/02/2015    History of present illness 55 y.o. male s/p L knee I&D, TKA hardware removal and spacer placement.   OT comments  Progressing towards OT goals. Continue OT per plan of care. Note MD note from today as well as Physicians Order states pt is WBAT LLE.    Follow Up Recommendations  Supervision - Intermittent;Home health OT    Equipment Recommendations  None recommended by OT    Recommendations for Other Services      Precautions / Restrictions Precautions Precautions: Knee Required Braces or Orthoses: Knee Immobilizer - Left Knee Immobilizer - Left: On at all times Restrictions Weight Bearing Restrictions: Yes LLE Weight Bearing: Weight bearing as tolerated Other Position/Activity Restrictions: order and MD note indicate WBAT       Mobility Bed Mobility               General bed mobility comments: pt initially agreed to get up to chair, then decided he wanted to wait for PT  Transfers                 General transfer comment: pt wanted to wait for PT session    Balance                                   ADL Overall ADL's : Needs assistance/impaired                                       General ADL Comments: Patient received in bed. Reviewed OT role/POC. Patient reports he is going home to his parents' home tomorrow where he will have 24/7 A. Began discussion of AE for LB self-care and he declined, stating his parents could assist. Patient reports he has been transferring on/off BSC with nursing without difficulty using RW. Denies need to practice during OT session. Verbal education regarding tub transfer technique and pt verbalized understanding but declined to practice, stating, "I've been through 5 surgeries on this knee, I think I know what I'm doing." Educated that MD may not want him to shower  initially and he verbalized understanding.       Vision                     Perception     Praxis      Cognition   Behavior During Therapy: WFL for tasks assessed/performed Overall Cognitive Status: Within Functional Limits for tasks assessed                       Extremity/Trunk Assessment               Exercises     Shoulder Instructions       General Comments      Pertinent Vitals/ Pain       Pain Assessment: 0-10 Pain Score: 5  Pain Location: L knee/thigh Pain Descriptors / Indicators: Aching;Sore Pain Intervention(s): Limited activity within patient's tolerance;Monitored during session  Home Living                                          Prior Functioning/Environment  Frequency  Min 2X/week        Progress Toward Goals  OT Goals(current goals can now be found in the care plan section)  Progress towards OT goals: Progressing toward goals  Acute Rehab OT Goals Patient Stated Goal: to go home  Plan Discharge plan remains appropriate    Co-evaluation                 End of Session Equipment Utilized During Treatment: Left knee immobilizer   Activity Tolerance Patient tolerated treatment well   Patient Left in bed;with call bell/phone within reach;with family/visitor present   Nurse Communication          Time: [id number] OT Time Calculation (min): 9 min  Charges: OT General Charges $OT Visit: 1 Procedure OT Treatments $Self Care/Home Management : 8-22 mins  Winnell Bento A 11/02/2015, 10:51 AM

## 2015-11-02 NOTE — Progress Notes (Signed)
PATIENT ID: Richard [name]  MRN: [id number]  DOB/AGE:  10/18/1960 / 55 y.o. y.o.  3 Days Post-Op Procedure(s) (LRB): IRRIGATION AND DEBRIDEMENT KNEE WITH POLY EXCHANGE PLACE SPACERS (Left)    PROGRESS NOTE Subjective: Patient is alert, oriented, no Nausea, no Vomiting, yes passing gas. Taking PO well. Denies SOB, Chest or Calf Pain. Using Incentive Spirometer, PAS in place. Ambulate 70 ft, NO CPM  Patient reports pain as 4/10 .    Objective: Vital signs in last 24 hours: Vitals:   11/01/15 1246 11/01/15 1458 11/01/15 2000 11/02/15 0500  BP: 114/68  (!) 104/51 119/63  Pulse: 85  77 75  Resp: 16  16 16   Temp: 100 F (37.8 C) 98.6 F (37 C) 98.9 F (37.2 C) 99 F (37.2 C)  TempSrc: Oral Oral Oral Oral  SpO2: 97%  97% 99%  Weight:          Intake/Output from previous day: I/O last 3 completed shifts: In: 440 [P.O.:240; IV Piggyback:200] Out: 725 [Urine:600; Drains:125]   Intake/Output this shift: No intake/output data recorded.   LABORATORY DATA:  Recent Labs  10/30/15 1348 10/31/15 0505 11/01/15 0415 11/01/15 0845 11/02/15 0416  WBC 8.9 15.4* 12.4*  --  10.7*  HGB 14.3 10.8* 9.0*  --  8.3*  HCT 44.8 34.2* 28.4*  --  26.1*  PLT 264 241 156  --  178  NA 139 136  --  134*  --   K 4.4 4.8  --  4.2  --   CL 108 102  --  102  --   CO2 23 24  --  26  --   BUN 15 17  --  14  --   CREATININE 1.19 1.48*  --  1.30*  --   GLUCOSE 82 165*  --  123*  --   INR 1.10  --   --   --   --   CALCIUM 9.5 8.8*  --  8.6*  --     Examination: Neurologically intact ABD soft Neurovascular intact Sensation intact distally Intact pulses distally Dorsiflexion/Plantar flexion intact Incision: dressing C/D/I No cellulitis present Compartment soft}  Assessment:   3 Days Post-Op Procedure(s) (LRB): IRRIGATION AND DEBRIDEMENT KNEE WITH POLY EXCHANGE PLACE SPACERS (Left) ADDITIONAL DIAGNOSIS: Expected Acute Blood Loss Anemia, Hx multiple PE's  Plan: PT/OT WBAT, NO CPM, Gentle active  range of motion only. Patient has antibiotic spacers, no passive range of motion  DVT Prophylaxis:  Full dose Xarelto, as patient had bilateral PEs last year after his total knee   DISCHARGE PLAN: Home DISCHARGE NEEDS: HHPT, Walker, 3-in-1 comode seat and IV Antibiotics, vancomycin for 6 weeks. Plan is for discharge tomorrow.     Frederik Pear J 11/02/2015, 7:23 AM

## 2015-11-03 MED ORDER — VANCOMYCIN HCL 10 G IV SOLR: 1250.0000 mg | Freq: Two times a day (BID) | INTRAVENOUS | 1 refills | Status: DC

## 2015-11-03 MED ORDER — HEPARIN SOD (PORK) LOCK FLUSH 100 UNIT/ML IV SOLN
250.0000 [IU] | INTRAVENOUS | Status: AC | PRN
  Administered 2015-11-03: 250 [IU]

## 2015-11-03 NOTE — Discharge Summary (Signed)
Patient ID: COTTON POIROT MRN: [id number] DOB/AGE: 54/27/1962 54 y.o.  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 Arthritis   . Arthrofibrosis of total knee arthroplasty ([place]) 05/30/2015  . Bilateral pulmonary embolism ([place])    a. 03/2015 CTA: acute bilat PE.  Marland Kitchen Chest pain    a. 2015 Abnl MV;  b. 2015 Cath: nl cors.  . Chronic diastolic CHF (congestive heart failure) ([place])    a. 03/2015 Echo: EF 55-65%, poor windows, mildly dil RV.  Marland Kitchen DVT (deep venous thrombosis) ([place])    a. 03/2015 U/S: occlusive DVT w/in the distal aspect of the Left fem vein through the L popliteal vein.  Marland Kitchen Dysrhythmia    PAF  . GERD (gastroesophageal reflux disease)   . Headache   . Hypertension   . Hypertensive heart disease   . Obesity   . OSA (obstructive sleep apnea)    uses CPAP nightly  . Primary localized osteoarthritis of left knee    a. 11/2014 s/p L TKA.  . Primary localized osteoarthritis of right knee    a. 02/2014 s/p R Partial Knee arthroplasty.  . Stiffness of left knee    a. 12/2014 s/p manipulation under anesthesia.  . Urine incontinence     Surgeries: Procedure(s): IRRIGATION AND DEBRIDEMENT KNEE WITH POLY EXCHANGE PLACE SPACERS on 10/30/2015   Consultants:   Discharged Condition: Improved  Hospital Course: OSBORN HOGLEN is an 55 y.o. male who was admitted 10/30/2015 for operative treatment ofInfection associated with internal left knee prosthesis ([place]). Patient has severe unremitting pain that affects sleep, daily activities, and work/hobbies. After pre-op clearance the patient was taken to the operating room on 10/30/2015 and underwent   Procedure(s): IRRIGATION AND DEBRIDEMENT KNEE WITH POLY EXCHANGE PLACE SPACERS.    Patient was given perioperative antibiotics: Anti-infectives    Start     Dose/Rate Route Frequency Ordered Stop   11/03/15 0000  vancomycin (VANCOCIN) 1,250 mg in sodium chloride 0.9 % 250 mL IVPB     1,250 mg 166.7 mL/hr over 90 Minutes Intravenous Every 12 hours 11/02/15 2345     11/03/15 0000  vancomycin 1,250 mg in sodium chloride 0.9 % 250 mL     1,250 mg 166.7 mL/hr over 90 Minutes Intravenous Every 12 hours 11/03/15 0949     11/01/15 0800  vancomycin (VANCOCIN) IVPB 1000 mg/200 mL premix  Status:  Discontinued     1,000 mg 200 mL/hr over 60 Minutes Intravenous Every 12 hours 11/01/15 0754 11/02/15 2345   10/31/15 0600  vancomycin (VANCOCIN) IVPB 1000 mg/200 mL premix     1,000 mg 200 mL/hr over 60 Minutes Intravenous On call to O.R. 10/30/15 1250 10/30/15 1442   10/31/15 0600  vancomycin (VANCOCIN) IVPB 1000 mg/200 mL premix     1,000 mg 200 mL/hr over 60 Minutes Intravenous Every 12 hours 10/30/15 2118 10/31/15 0711   10/30/15 1627  vancomycin (VANCOCIN) powder  Status:  Discontinued       As needed 10/30/15 1627 10/30/15 1901   10/30/15 1626  tobramycin (NEBCIN) powder  Status:  Discontinued       As needed 10/30/15 1626 10/30/15 1901       Patient was given sequential compression  devices, early ambulation, and chemoprophylaxis to prevent DVT.  Patient benefited maximally from hospital stay and there were no complications.    Recent vital signs: Patient Vitals for the past 24 hrs:  BP Temp Temp src Pulse Resp SpO2 Height  11/03/15 0422 114/64 98.8 F (37.1 C) Oral 70 16 100 % -  11/02/15 2200 - 98.5 F (36.9 C) - - - - -  11/02/15 2013 (!) 120/55 (!) 101 F (38.3 C) Oral 80 18 97 % -  11/02/15 1834 - - - - - - 5\' 9"  (1.753 m)  11/02/15 1319 118/63 (!) 100.8 F (38.2 C) Oral 86 17 95 % -     Recent laboratory studies:  Recent Labs  11/01/15 0415 11/01/15 0845 11/02/15 0416   WBC 12.4*  --  10.7*  HGB 9.0*  --  8.3*  HCT 28.4*  --  26.1*  PLT 156  --  178  NA  --  134*  --   K  --  4.2  --   CL  --  102  --   CO2  --  26  --   BUN  --  14  --   CREATININE  --  1.30*  --   GLUCOSE  --  123*  --   CALCIUM  --  8.6*  --      Discharge Medications:     Medication List    TAKE these medications   apixaban 5 MG Tabs tablet Commonly known as:  ELIQUIS Take 1 tablet (5 mg total) by mouth 2 (two) times daily.   baclofen 10 MG tablet Commonly known as:  LIORESAL Take 1 tablet (10 mg total) by mouth 3 (three) times daily as needed for muscle spasms.   esomeprazole 40 MG capsule Commonly known as:  NEXIUM Take 40 mg by mouth daily as needed (for acid reflux).   metoprolol succinate 25 MG 24 hr tablet Commonly known as:  TOPROL-XL Take 1 tablet (25 mg total) by mouth 2 (two) times daily. Take with or immediately following a meal.   oxyCODONE-acetaminophen 5-325 MG tablet Commonly known as:  ROXICET Take 1 tablet by mouth every 4 (four) hours as needed.   vancomycin 1,250 mg in sodium chloride 0.9 % 250 mL Inject 1,250 mg into the vein every 12 (twelve) hours.       Diagnostic Studies: Dg Chest 2 View  Result Date: 10/30/2015 CLINICAL DATA:  Preoperative examination prior to knee prosthesis removal due to infection. EXAM: CHEST  2 VIEW COMPARISON:  Chest x-ray of August 19, 2015 FINDINGS: The lungs are well-expanded and clear. The heart and pulmonary vascularity are normal. The mediastinum is normal in width. There is no pleural effusion. There is multilevel degenerative disc disease of the thoracic spine. IMPRESSION: There is no active cardiopulmonary disease. Electronically Signed   By: David  Martinique M.D.   On: 10/30/2015 13:27   Dg Hips Bilat W Or W/o Pelvis 3-4 Views  Result Date: 10/10/2015 CLINICAL DATA:  BILATERAL hip pain LEFT greater than RIGHT for 2 months, failed TKR EXAM: DG HIP (WITH OR WITHOUT PELVIS) 3-4V BILAT COMPARISON:  None  FINDINGS: Minimal narrowing of hip joints bilaterally. Exam limited by body habitus. SI joints grossly symmetric. No acute fracture, dislocation or bone destruction. IMPRESSION: Minimal degenerative changes of the hip joints. No acute abnormalities. Electronically Signed   By: Lavonia Dana M.D.   On: 10/10/2015 16:23    Disposition: 01-Home or Self Care  Discharge Instructions  Call MD / Call 911    Complete by:  As directed    If you experience chest pain or shortness of breath, CALL 911 and be transported to the hospital emergency room.  If you develope a fever above 101 F, pus (white drainage) or increased drainage or redness at the wound, or calf pain, call your surgeon's office.   Change dressing    Complete by:  As directed    Change dressing on 5, then change the dressing daily with sterile 4 x 4 inch gauze dressing and apply TED hose.  You may clean the incision with alcohol prior to redressing.   Constipation Prevention    Complete by:  As directed    Drink plenty of fluids.  Prune juice may be helpful.  You may use a stool softener, such as Colace (over the counter) 100 mg twice a day.  Use MiraLax (over the counter) for constipation as needed.   Diet - low sodium heart healthy    Complete by:  As directed    Driving restrictions    Complete by:  As directed    No driving for 2 weeks   Increase activity slowly as tolerated    Complete by:  As directed    Patient may shower    Complete by:  As directed    You may shower without a dressing once there is no drainage.  Do not wash over the wound.  If drainage remains, cover wound with plastic wrap and then shower.      Follow-up Information    [facility] .   Why:  Someone from [facility] will contact you to arrange for [facility] and therapist to visit. Contact information: [place] [address] [place], [place] .   Specialty:  Orthopedic Surgery Contact  information: [place] [address] [phone]            Signed: Hardin Negus ERIC R 11/03/2015, 9:51 AM

## 2015-11-03 NOTE — Progress Notes (Signed)
Physical Therapy Treatment Patient Details Name: Richard [name] MRN: [id number] DOB: [birth year] Today's Date: 11/03/2015    History of Present Illness 55 y.o. male s/p L knee I&D, TKA hardware removal and spacer placement.    PT Comments    Pt continuing to progress gradually with mobility. Able to perform stairs and ambulation during PT session. By end of session pt reports feeling confident with returning home with family support. Pt educated on needed to continue to remain active and work on ambulation with assistance at home. Patient denies any questions or concerns following session.   Follow Up Recommendations  Home health PT;Supervision for mobility/OOB     Equipment Recommendations  None recommended by PT    Recommendations for Other Services       Precautions / Restrictions Precautions Precautions: Knee Required Braces or Orthoses: Knee Immobilizer - Left Knee Immobilizer - Left: On at all times Restrictions Weight Bearing Restrictions: Yes LLE Weight Bearing: Weight bearing as tolerated    Mobility  Bed Mobility               General bed mobility comments: sitting in chair upon arrival  Transfers Overall transfer level: Needs assistance Equipment used: Rolling walker (2 wheeled) Transfers: Sit to/from Stand Sit to Stand: Min guard         General transfer comment: min guard for safety  Ambulation/Gait Ambulation/Gait assistance: Supervision Ambulation Distance (Feet): 60 Feet Assistive device: Rolling walker (2 wheeled) Gait Pattern/deviations: Step-to pattern Gait velocity: decreased   General Gait Details: distance limited by pt reports of fatigue   Stairs Stairs: Yes Stairs assistance: Min guard Stair Management: One rail Right;Step to pattern;Forwards;With crutches (single crutch on Lt) Number of Stairs: 2 General stair comments: Pt reports having 2 steps to enter his home. By end of session pt reports feeling confident with getting  into his home. Reinforced recommendation to have someone to assist for safety.   Wheelchair Mobility    Modified Rankin (Stroke Patients Only)       Balance Overall balance assessment: Needs assistance Sitting-balance support: No upper extremity supported Sitting balance-Leahy Scale: Good     Standing balance support: Bilateral upper extremity supported Standing balance-Leahy Scale: Poor Standing balance comment: using rw                    Cognition Arousal/Alertness: Awake/alert Behavior During Therapy: WFL for tasks assessed/performed Overall Cognitive Status: Within Functional Limits for tasks assessed                      Exercises Total Joint Exercises Ankle Circles/Pumps: AROM;Both;15 reps;Supine    General Comments        Pertinent Vitals/Pain Pain Assessment: Faces Faces Pain Scale: Hurts little more Pain Location: Lt knee/ankle Pain Descriptors / Indicators: Aching;Sore Pain Intervention(s): Limited activity within patient's tolerance;Monitored during session    Home Living                      Prior Function            PT Goals (current goals can now be found in the care plan section) Acute Rehab PT Goals Patient Stated Goal: to go home PT Goal Formulation: With patient Time For Goal Achievement: 11/14/15 Potential to Achieve Goals: Good Progress towards PT goals: Progressing toward goals    Frequency    7X/week      PT Plan Current plan remains appropriate    Co-evaluation  End of Session Equipment Utilized During Treatment: Gait belt;Left knee immobilizer Activity Tolerance: Patient tolerated treatment well;Patient limited by fatigue Patient left: in chair;with call bell/phone within reach     Time: 1007-1038 PT Time Calculation (min) (ACUTE ONLY): 31 min  Charges:  $Gait Training: 23-37 mins                    G Codes:      Cassell Clement, PT, CSCS Pager [phone] Office [phone]  [phone]  11/03/2015, 10:48 AM

## 2015-11-03 NOTE — Progress Notes (Signed)
Contacted Richard [name], [place] about pt's temp of 101.1 and if pt can still discharge. He stated as long as temps are under 102 pt is still ok to discharge. Pt has been diligently working with IS and temps are responding to tylenol and IS.  Discharge instructions and prescriptions reviewed with pt by SWOT RN. Assisted to car in wheelchair by RN. All belongings sent with family.   [place], [name]

## 2015-11-03 NOTE — Progress Notes (Signed)
PATIENT ID: Richard [name]  MRN: [id number]  DOB/AGE:  [birth year] / 55 y.o.  4 Days Post-Op Procedure(s) (LRB): IRRIGATION AND DEBRIDEMENT KNEE WITH POLY EXCHANGE PLACE SPACERS (Left)    PROGRESS NOTE Subjective: Patient is alert, oriented, no Nausea, no Vomiting, yes passing gas. Taking PO well. Denies SOB, Chest or Calf Pain. Using Incentive Spirometer, PAS in place. Ambulate WBAT with pt walking 70 ft,  Patient reports pain as 4/10 .    Objective: Vital signs in last 24 hours: Vitals:   11/02/15 1834 11/02/15 2013 11/02/15 2200 11/03/15 0422  BP:  (!) 120/55  114/64  Pulse:  80  70  Resp:  18  16  Temp:  (!) 101 F (38.3 C) 98.5 F (36.9 C) 98.8 F (37.1 C)  TempSrc:  Oral  Oral  SpO2:  97%  100%  Weight:      Height: 5\' 9"  (1.753 m)         Intake/Output from previous day: I/O last 3 completed shifts: In: 720 [P.O.:720] Out: -    Intake/Output this shift: No intake/output data recorded.   LABORATORY DATA:  Recent Labs  11/01/15 0415 11/01/15 0845 11/02/15 0416  WBC 12.4*  --  10.7*  HGB 9.0*  --  8.3*  HCT 28.4*  --  26.1*  PLT 156  --  178  NA  --  134*  --   K  --  4.2  --   CL  --  102  --   CO2  --  26  --   BUN  --  14  --   CREATININE  --  1.30*  --   GLUCOSE  --  123*  --   CALCIUM  --  8.6*  --     Examination: Neurologically intact Neurovascular intact Sensation intact distally Intact pulses distally Dorsiflexion/Plantar flexion intact Incision: dressing C/D/I No cellulitis present Compartment soft}  Assessment:   4 Days Post-Op Procedure(s) (LRB): IRRIGATION AND DEBRIDEMENT KNEE WITH POLY EXCHANGE PLACE SPACERS (Left) ADDITIONAL DIAGNOSIS: Expected Acute Blood Loss Anemia, Hx of Multiple PE's  Plan: PT/OT WBAT, NO CPM, Gentle active range of motion only. Patient has antibiotic spacers, no passive range of motion  DVT Prophylaxis:  Full dose Xarelto, as patient had bilateral PEs last year after his total knee s DISCHARGE PLAN:  Home DISCHARGE NEEDS: HHPT, HHRN, Walker, 3-in-1 comode seat and IV Antibiotics, Vancomycin for 6 weeks.     [name] 11/03/2015, 9:44 AM

## 2015-11-03 NOTE — Progress Notes (Signed)
Occupational Therapy Treatment Patient Details Name: SAEVION PFALZGRAF MRN: [id number] DOB: 11/29/1960 Today's Date: 11/03/2015    History of present illness 55 y.o. male s/p L knee I&D, TKA hardware removal and spacer placement.   OT comments  Pt making progress towards goals and mobilized for sink level ADL this session, son present to receive education as well. Pt anxious to have IV detached so that he can put on his own shirt. All Pt and son's questions and concerns answered by the OT.   Follow Up Recommendations  Supervision - Intermittent;Home health OT    Equipment Recommendations  None recommended by OT    Recommendations for Other Services      Precautions / Restrictions Precautions Precautions: Knee Required Braces or Orthoses: Knee Immobilizer - Left Knee Immobilizer - Left: On at all times Restrictions Weight Bearing Restrictions: Yes LLE Weight Bearing: Weight bearing as tolerated LLE Partial Weight Bearing Percentage or Pounds: 50%       Mobility Bed Mobility               General bed mobility comments: sitting in chair upon arrival  Transfers Overall transfer level: Needs assistance Equipment used: Rolling walker (2 wheeled) Transfers: Sit to/from Stand Sit to Stand: Min guard         General transfer comment: min guard for safety    Balance Overall balance assessment: Needs assistance Sitting-balance support: No upper extremity supported;Feet supported Sitting balance-Leahy Scale: Good     Standing balance support: Bilateral upper extremity supported;During functional activity Standing balance-Leahy Scale: Poor Standing balance comment: resting forearms on sink during ADL                   ADL   Eating/Feeding: Modified independent;Sitting   Grooming: Wash/dry hands;Wash/dry face;Min guard;Standing Grooming Details (indicate cue type and reason): sink level ADL             Lower Body Dressing: Sit to/from stand;Moderate  assistance;With adaptive equipment   Toilet Transfer: Min guard;Cueing for safety;Ambulation;BSC;RW (cues for safe hand placement) Toilet Transfer Details (indicate cue type and reason): simulated with recliner         Functional mobility during ADLs: Supervision/safety;Rolling walker General ADL Comments: Pt making progress towards ADL goals, willing to work with therapy today.      Vision                     Perception     Praxis      Cognition   Behavior During Therapy: [place] for tasks assessed/performed Overall Cognitive Status: Within Functional Limits for tasks assessed                       Extremity/Trunk Assessment               Exercises Total Joint Exercises Ankle Circles/Pumps: AROM;Both;15 reps;Supine   Shoulder Instructions       General Comments      Pertinent Vitals/ Pain       Pain Assessment: Faces Faces Pain Scale: Hurts little more Pain Location: Left knee and ankle Pain Descriptors / Indicators: Aching;Sore Pain Intervention(s): Limited activity within patient's tolerance;Monitored during session  Home Living                                          Prior Functioning/Environment  Frequency  Min 2X/week        Progress Toward Goals  OT Goals(current goals can now be found in the care plan section)  Progress towards OT goals: Progressing toward goals  Acute Rehab OT Goals Patient Stated Goal: to go home  Plan Discharge plan remains appropriate    Co-evaluation                 End of Session Equipment Utilized During Treatment: Rolling walker;Left knee immobilizer   Activity Tolerance Patient tolerated treatment well   Patient Left in chair;with call bell/phone within reach;with family/visitor present   Nurse Communication Mobility status        Time: [id number] OT Time Calculation (min): 27 min  Charges: OT General Charges $OT Visit: 1 Procedure OT  Treatments $Self Care/Home Management : 23-37 mins  Jaci Carrel 11/03/2015, 2:26 PM Hulda Humphrey OTR/L [phone]

## 2015-11-05 LAB — AEROBIC/ANAEROBIC CULTURE W GRAM STAIN (SURGICAL/DEEP WOUND)

## 2015-11-05 LAB — AEROBIC/ANAEROBIC CULTURE (SURGICAL/DEEP WOUND): Culture: NO GROWTH

## 2015-11-07 ENCOUNTER — Emergency Department (HOSPITAL_COMMUNITY): Payer: Managed Care, Other (non HMO)

## 2015-11-07 ENCOUNTER — Emergency Department (HOSPITAL_COMMUNITY)
Admission: EM | Admit: 2015-11-07 | Discharge: 2015-11-07 | Disposition: A | Discharge status: Home / Self Care | Payer: Managed Care, Other (non HMO) | Attending: Emergency Medicine | Admitting: Emergency Medicine

## 2015-11-07 ENCOUNTER — Encounter (HOSPITAL_COMMUNITY): Payer: Self-pay | Admitting: *Deleted

## 2015-11-07 DIAGNOSIS — T80211A Bloodstream infection due to central venous catheter, initial encounter: Secondary | ICD-10-CM | POA: Insufficient documentation

## 2015-11-07 DIAGNOSIS — I5032 Chronic diastolic (congestive) heart failure: Secondary | ICD-10-CM | POA: Diagnosis not present

## 2015-11-07 DIAGNOSIS — Y829 Unspecified medical devices associated with adverse incidents: Secondary | ICD-10-CM | POA: Insufficient documentation

## 2015-11-07 DIAGNOSIS — I110 Hypertensive heart disease with heart failure: Secondary | ICD-10-CM | POA: Insufficient documentation

## 2015-11-07 DIAGNOSIS — T82838A Hemorrhage of vascular prosthetic devices, implants and grafts, initial encounter: Secondary | ICD-10-CM

## 2015-11-07 DIAGNOSIS — Z7901 Long term (current) use of anticoagulants: Secondary | ICD-10-CM | POA: Insufficient documentation

## 2015-11-07 DIAGNOSIS — Z79899 Other long term (current) drug therapy: Secondary | ICD-10-CM | POA: Insufficient documentation

## 2015-11-07 DIAGNOSIS — Z96653 Presence of artificial knee joint, bilateral: Secondary | ICD-10-CM | POA: Diagnosis not present

## 2015-11-07 NOTE — ED Provider Notes (Signed)
[facility] DEPT Provider Note   CSN: [id number] Arrival date & time: 11/07/15  0245  By signing my name below, I, Reola Mosher, attest that this documentation has been prepared under the direction and in the presence of Orpah Greek, MD. Electronically Signed: Reola Mosher, ED Scribe. 11/07/15. 3:36 AM.  History   Chief Complaint Chief Complaint  Patient presents with  . Vascular Access Problem   The history is provided by the patient. No language interpreter was used.   HPI Comments: Richard [name] is a 54 y.o. male who is ~8 days s/p irrigation and debridement procedure of the left knee, who presents to the Emergency Department complaining of a PICC line problem that occurred PTA. Pt reports that ~6 days ago he had a PICC line placed in his right, upper arm to receive IV antibiotics and tonight he noticed that the site around the area was dripping w/ blood. Bleeding to the area is controlled while in the ED. Pt is currently followed by an at-home nursing service and he was last seen yesterday morning. At that time pt had the line flushed and tolerated his daily medications w/o difficulty. No treatments were tried prior to arrival. He denies pain otherwise, or any other associated symptoms.   Past Medical History:  Diagnosis Date  . Arthritis   . Arthrofibrosis of total knee arthroplasty ([place]) 05/30/2015  . Bilateral pulmonary embolism ([place])    a. 03/2015 CTA: acute bilat PE.  Marland Kitchen Chest pain    a. 2015 Abnl MV;  b. 2015 Cath: nl cors.  . Chronic diastolic CHF (congestive heart failure) ([place])    a. 03/2015 Echo: EF 55-65%, poor windows, mildly dil RV.  Marland Kitchen DVT (deep venous thrombosis) ([place])    a. 03/2015 U/S: occlusive DVT w/in the distal aspect of the Left fem vein through the L popliteal vein.  Marland Kitchen Dysrhythmia    PAF  . GERD (gastroesophageal reflux disease)   . Headache   . Hypertension   . Hypertensive heart disease   . Obesity   . OSA (obstructive sleep  apnea)    uses CPAP nightly  . Primary localized osteoarthritis of left knee    a. 11/2014 s/p L TKA.  . Primary localized osteoarthritis of right knee    a. 02/2014 s/p R Partial Knee arthroplasty.  . Stiffness of left knee    a. 12/2014 s/p manipulation under anesthesia.  . Urine incontinence    Patient Active Problem List   Diagnosis Date Noted  . Unsteadiness on feet   . Coagulase-negative staphylococcal infection   . Infection of total left knee replacement ([place]) 10/30/2015  . 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 Arthrofibrosis of total knee arthroplasty ([place]) 05/30/2015  . PAF (paroxysmal atrial fibrillation) ([place]) 03/30/2015  . Peroneal DVT (deep venous thrombosis) ([place])   . Pulmonary embolism ([place]) 03/27/2015  . Hypertension 08/17/2011  . LVH (left ventricular hypertrophy) 08/17/2011  . GERD (gastroesophageal reflux disease) 08/17/2011  . Morbid obesity ([place]) 08/16/2011  . OSA (obstructive sleep apnea) 08/16/2011   Past Surgical History:  Procedure Laterality Date  . APPENDECTOMY    . CARDIAC CATHETERIZATION    . EXAM UNDER ANESTHESIA WITH MANIPULATION OF KNEE Left 05/30/2015   Procedure: EXAM UNDER ANESTHESIA WITH MANIPULATION OF LEFT KNEE;  Surgeon: Marchia Bond, MD;  Location: [place];  Service: Orthopedics;  Laterality: Left;  . I&D KNEE WITH POLY EXCHANGE Left 10/30/2015   Procedure: IRRIGATION AND DEBRIDEMENT KNEE  WITH POLY EXCHANGE PLACE SPACERS;  Surgeon: Frederik Pear, MD;  Location: [place];  Service: Orthopedics;  Laterality: Left;  OFF ELIQUIST 3 DAYS  . IVC FILTER PLACEMENT ([facility] HX)  04/13/15  . JOINT REPLACEMENT    . KNEE ARTHROSCOPY Left 08/08/2015   Procedure: LEFT KNEE MANIPULATION WITH ARTHROSCOPIC LYSIS OF ADHESIONS AND ASPIRATION;  Surgeon: Marchia Bond, MD;  Location: [place];  Service: Orthopedics;  Laterality: Left;  . KNEE CLOSED REDUCTION Left 01/20/2015   Procedure: LEFT KNEE MANIPULATION;  Surgeon: Marchia Bond,  MD;  Location: [place];  Service: Orthopedics;  Laterality: Left;  . LEFT HEART CATHETERIZATION WITH CORONARY ANGIOGRAM N/A 01/12/2014   Procedure: LEFT HEART CATHETERIZATION WITH CORONARY ANGIOGRAM;  Surgeon: Jettie Booze, MD;  Location: [facility] CATH LAB;  Service: Cardiovascular;  Laterality: N/A;  . ORTHOPEDIC SURGERY Left    arthroscopy x3  . PARTIAL KNEE ARTHROPLASTY Right 02/25/2014   Procedure: RIGHT KNEE ARTHROPLASTY CONDYLE AND PLATEAU MEDIAL COMPARTMENT ;  Surgeon: Johnny Bridge, MD;  Location: [facility];  Service: Orthopedics;  Laterality: Right;  . PERIPHERAL VASCULAR CATHETERIZATION N/A 03/29/2015   Procedure: IVC Filter Insertion, and possible thrombectomy;  Surgeon: Katha Cabal, MD;  Location: [place] [facility] CV LAB;  Service: Cardiovascular;  Laterality: N/A;  . TOTAL KNEE ARTHROPLASTY  12/06/2014   Procedure: TOTAL KNEE ARTHROPLASTY;  Surgeon: Marchia Bond, MD;  Location: [place];  Service: Orthopedics;;    Home Medications    Prior to Admission medications   Medication Sig Start Date End Date Taking? Authorizing Provider  apixaban (ELIQUIS) 5 MG TABS tablet Take 1 tablet (5 mg total) by mouth 2 (two) times daily. 04/30/15   Isaiah Serge, NP  baclofen (LIORESAL) 10 MG tablet Take 1 tablet (10 mg total) by mouth 3 (three) times daily as needed for muscle spasms. 05/30/15   Marchia Bond, MD  esomeprazole (NEXIUM) 40 MG capsule Take 40 mg by mouth daily as needed (for acid reflux).     Historical Provider, MD  metoprolol succinate (TOPROL-XL) 25 MG 24 hr tablet Take 1 tablet (25 mg total) by mouth 2 (two) times daily. Take with or immediately following a meal. 06/12/15   Minna Merritts, MD  oxyCODONE-acetaminophen (ROXICET) 5-325 MG tablet Take 1 tablet by mouth every 4 (four) hours as needed. 11/01/15   Leighton Parody, PA-C  vancomycin 1,250 mg in sodium chloride 0.9 % 250 mL Inject 1,250 mg into the vein every 12 (twelve) hours. 11/03/15    Leighton Parody, PA-C   Family History Family History  Problem Relation Age of Onset  . Coronary artery disease Mother   . Hyperlipidemia Mother   . Hypertension Mother   . Arthritis Mother   . Coronary artery disease Father   . Heart disease Paternal Grandmother   . Alzheimer's disease Paternal Grandfather    Social History Social History  Substance Use Topics  . Smoking status: Never Smoker  . Smokeless tobacco: Never Used  . Alcohol use Yes     Comment: occassional   Allergies   Penicillins  Review of Systems Review of Systems  Skin: Positive for wound.  All other systems reviewed and are negative.  Physical Exam Updated Vital Signs BP 119/60   Pulse 66   Temp 97.9 F (36.6 C) (Oral)   Ht 5\' 9"  (1.753 m)   Wt 265 lb (120.2 kg)   SpO2 97%   BMI 39.13 kg/m   Physical Exam  Constitutional:  He is oriented to person, place, and time. He appears well-developed and well-nourished. No distress.  HENT:  Head: Normocephalic and atraumatic.  Right Ear: Hearing normal.  Left Ear: Hearing normal.  Nose: Nose normal.  Mouth/Throat: Oropharynx is clear and moist and mucous membranes are normal.  Eyes: Conjunctivae and EOM are normal. Pupils are equal, round, and reactive to light.  Neck: Normal range of motion. Neck supple.  Cardiovascular: Normal rate, regular rhythm, S1 normal, S2 normal and normal heart sounds.  Exam reveals no gallop and no friction rub.   No murmur heard. Pulmonary/Chest: Effort normal and breath sounds normal. No respiratory distress. He has no wheezes. He exhibits no tenderness.  Abdominal: Soft. Normal appearance and bowel sounds are normal. There is no hepatosplenomegaly. There is no tenderness. There is no rebound, no guarding, no tenderness at McBurney's point and negative Murphy's sign. No hernia.  Musculoskeletal: Normal range of motion.  Neurological: He is alert and oriented to person, place, and time. He has normal strength. No cranial  nerve deficit or sensory deficit. Coordination normal. GCS eye subscore is 4. GCS verbal subscore is 5. GCS motor subscore is 6.  Skin: Skin is warm, dry and intact. No rash noted. No cyanosis.  PICC line placed to the right, upper arm. Area is without signs of surrounding erythema or other signs of infection. Small amount of blood to the area, but with no active bleeding.   Psychiatric: He has a normal mood and affect. His speech is normal and behavior is normal. Thought content normal.  Nursing note and vitals reviewed.  ED Treatments / Results  DIAGNOSTIC STUDIES: Oxygen Saturation is 99% on RA, normal by my interpretation.   COORDINATION OF CARE: 3:36 AM-Discussed next steps with pt. Pt verbalized understanding and is agreeable with the plan.   Labs (all labs ordered are listed, but only abnormal results are displayed) Labs Reviewed - No data to display  EKG  EKG Interpretation None      Radiology Dg Chest 2 View  Result Date: 11/07/2015 CLINICAL DATA:  55 year old male with right-sided PICC placement EXAM: CHEST  2 VIEW COMPARISON:  Chest radiograph dated 10/30/2015 FINDINGS: There has been interval placement of a right-sided PICC with tip at the cavoatrial junction. The lungs are clear. No pleural effusion or pneumothorax. Top-normal cardiac size. No acute osseous pathology. IMPRESSION: Interval placement of a right-sided PICC with tip at the cavoatrial junction. No pneumothorax. Electronically Signed   By: Anner Crete M.D.   On: 11/07/2015 04:09    Procedures Procedures   Medications Ordered in ED Medications - No data to display  Initial Impression / Assessment and Plan / ED Course  I have reviewed the triage vital signs and the nursing notes.  Pertinent labs & imaging results that were available during my care of the patient were reviewed by me and considered in my medical decision making (see chart for details).  Clinical Course   Patient presents with  concern over bleeding from a PICC line in the right upper arm. Examination did reveal small amount of fresh blood without active bleeding. Reviewing the records reveals that after line was placed, 2 cm of catheter was visible above the skin line, which is where the catheter is currently. X-ray was obtained and the tip of the catheter is appropriately placed as well. There is no erythema, warmth or signs of infection. No hematoma formation. Patient reassured, PICC line is appropriate for use.  Final Clinical Impressions(s) / ED Diagnoses  Final diagnoses:  Bleeding from peripherally inserted central venous catheter (PICC), initial encounter [facility])   New Prescriptions New Prescriptions   No medications on file   I personally performed the services described in this documentation, which was scribed in my presence. The recorded information has been reviewed and is accurate.       Orpah Greek, MD 11/07/15 [phone]

## 2015-11-07 NOTE — ED Triage Notes (Signed)
Patient presents with right upper arm PICC line bleeding.  States he is unsure if he pulled it out or not

## 2015-11-09 ENCOUNTER — Other Ambulatory Visit: Payer: Self-pay

## 2015-11-09 DIAGNOSIS — I480 Paroxysmal atrial fibrillation: Secondary | ICD-10-CM

## 2015-11-11 ENCOUNTER — Emergency Department (HOSPITAL_COMMUNITY)
Admission: EM | Admit: 2015-11-11 | Discharge: 2015-11-11 | Disposition: A | Discharge status: Home / Self Care | Payer: Managed Care, Other (non HMO) | Attending: Emergency Medicine | Admitting: Emergency Medicine

## 2015-11-11 ENCOUNTER — Emergency Department (HOSPITAL_COMMUNITY): Payer: Managed Care, Other (non HMO)

## 2015-11-11 ENCOUNTER — Encounter (HOSPITAL_COMMUNITY): Payer: Self-pay | Admitting: Emergency Medicine

## 2015-11-11 DIAGNOSIS — I110 Hypertensive heart disease with heart failure: Secondary | ICD-10-CM | POA: Diagnosis not present

## 2015-11-11 DIAGNOSIS — I5032 Chronic diastolic (congestive) heart failure: Secondary | ICD-10-CM | POA: Diagnosis not present

## 2015-11-11 DIAGNOSIS — R0789 Other chest pain: Secondary | ICD-10-CM | POA: Diagnosis present

## 2015-11-11 DIAGNOSIS — R079 Chest pain, unspecified: Secondary | ICD-10-CM | POA: Diagnosis not present

## 2015-11-11 DIAGNOSIS — I471 Supraventricular tachycardia: Secondary | ICD-10-CM | POA: Diagnosis not present

## 2015-11-11 DIAGNOSIS — Z96652 Presence of left artificial knee joint: Secondary | ICD-10-CM | POA: Insufficient documentation

## 2015-11-11 DIAGNOSIS — I480 Paroxysmal atrial fibrillation: Secondary | ICD-10-CM

## 2015-11-11 DIAGNOSIS — Z7901 Long term (current) use of anticoagulants: Secondary | ICD-10-CM | POA: Diagnosis not present

## 2015-11-11 LAB — I-STAT TROPONIN, ED
Troponin i, poc: 0 ng/mL (ref 0.00–0.08)
Troponin i, poc: 0 ng/mL (ref 0.00–0.08)

## 2015-11-11 LAB — BASIC METABOLIC PANEL
Anion gap: 6 (ref 5–15)
BUN: 9 mg/dL (ref 6–20)
CO2: 25 mmol/L (ref 22–32)
Calcium: 8.4 mg/dL — ABNORMAL LOW (ref 8.9–10.3)
Chloride: 108 mmol/L (ref 101–111)
Creatinine, Ser: 1.2 mg/dL (ref 0.61–1.24)
GFR calc Af Amer: >60 mL/min (ref >60)
GFR calc non Af Amer: >60 mL/min (ref >60)
Glucose, Bld: 131 mg/dL — ABNORMAL HIGH (ref 65–99)
Potassium: 4 mmol/L (ref 3.5–5.1)
Sodium: 139 mmol/L (ref 135–145)

## 2015-11-11 LAB — CBC
HCT: 30.6 % — ABNORMAL LOW (ref 39.0–52.0)
Hemoglobin: 9.4 g/dL — ABNORMAL LOW (ref 13.0–17.0)
MCH: 25.3 pg — ABNORMAL LOW (ref 26.0–34.0)
MCHC: 30.7 g/dL (ref 30.0–36.0)
MCV: 82.3 fL (ref 78.0–100.0)
Platelets: 302 10*3/uL (ref 150–400)
RBC: 3.72 MIL/uL — ABNORMAL LOW (ref 4.22–5.81)
RDW: 14.9 % (ref 11.5–15.5)
WBC: 7.1 10*3/uL (ref 4.0–10.5)

## 2015-11-11 LAB — MAGNESIUM: Magnesium: 1.8 mg/dL (ref 1.7–2.4)

## 2015-11-11 MED ORDER — SODIUM CHLORIDE 0.9 % IV BOLUS (SEPSIS)
1000.0000 mL | Freq: Once | INTRAVENOUS | Status: AC
  Administered 2015-11-11: 1000 mL via INTRAVENOUS

## 2015-11-11 MED ORDER — IOPAMIDOL (ISOVUE-370) INJECTION 76%
INTRAVENOUS | Status: AC
  Administered 2015-11-11: 100 mL
  Filled 2015-11-11: qty 100

## 2015-11-11 MED ORDER — VANCOMYCIN HCL 10 G IV SOLR
1250.0000 mg | Freq: Once | INTRAVENOUS | Status: AC
  Administered 2015-11-11: 1250 mg via INTRAVENOUS
  Filled 2015-11-11 (×2): qty 1250

## 2015-11-11 MED ORDER — MORPHINE SULFATE (PF) 4 MG/ML IV SOLN: 4.0000 mg | Freq: Once | INTRAVENOUS | Status: DC

## 2015-11-11 MED ORDER — FENTANYL CITRATE (PF) 100 MCG/2ML IJ SOLN
50.0000 ug | Freq: Once | INTRAMUSCULAR | Status: AC
  Administered 2015-11-11: 50 ug via INTRAVENOUS
  Filled 2015-11-11: qty 2

## 2015-11-11 MED ORDER — FLECAINIDE ACETATE 50 MG PO TABS: 150.0000 mg | ORAL_TABLET | ORAL | Status: DC | PRN

## 2015-11-11 MED ORDER — ONDANSETRON HCL 4 MG/2ML IJ SOLN
4.0000 mg | Freq: Once | INTRAMUSCULAR | Status: AC
  Administered 2015-11-11: 4 mg via INTRAVENOUS
  Filled 2015-11-11: qty 2

## 2015-11-11 MED ORDER — GI COCKTAIL ~~LOC~~
30.0000 mL | Freq: Once | ORAL | Status: AC
  Administered 2015-11-11: 30 mL via ORAL
  Filled 2015-11-11: qty 30

## 2015-11-11 NOTE — ED Provider Notes (Signed)
I received this pt in signout from Dr. Ellender Hose, who had consulted cardiology and was awaiting their evaluation. Pt seen by NP as well as Dr. Harl Bowie with cardiology. They feel that his chest pain does not appear cardiac in nature and given his stable HR here, he can be discharged with prn flecanide which they have called in. Pt voiced understanding of how to use medication and need for f/u with cardiology. I reviewed return precautions with him and also instructed to follow-up with PCP regarding further workup of noncardiac etiologies of his pain. Patient discharged in satisfactory condition.   Sharlett Iles, MD 11/11/15 [phone]

## 2015-11-11 NOTE — ED Notes (Signed)
Cardiology PA at bedside. 

## 2015-11-11 NOTE — Consult Note (Signed)
Patient ID: Richard [name] MRN: [id number], DOB/AGE: [birth year]   Admit date: 11/11/2015  Requesting Physcian:  Dr. Ellender Hose Primary Physician: Richard Silversmith, NP Primary Cardiologist: Dr. Fletcher Anon    Pt. Profile:  Richard [name] is a 55 y.o. male with a history of PAF, HTN, obesity, history  DVT/PE who presented to [facility] on 11/11/15 with chest pain and palpitations and found to be in afib with RVR. He spontaneously converted by the time he go to the ER. Now has some atypical chest pain and cardiology consulted.   The patient has history of normal cardiac catheterization in 2015 after an abnormal stress test. The patient underwent right partial knee arthroplasty in 02/2014 followed by left TKA in 11/2014. His mobility was limited after that and he had the flu in 02/2015. In early 03/2015, he presented with bilateral pulmonary embolisms. Lower extremity dopplers showed left lower extremity DVT. Echo showed normal LV systolic function with mildly dilated right ventricle. Hospital course was complicated by afib with RVR. He was placed on amiodarone and underwent catheter-based intervention for his pulmonary embolism as well as IVC filter placement. His condition gradually improved and he was discharged home on anticoagulation with Eliquis and PO amio.  He had recurrent palpitations on 04/11/15 and went to the emergency room. By the time he arrived to the emergency room he was in normal sinus rhythm. The dose of metoprolol XL was increased to 25 mg twice daily.   He was seen in 06/2015 by Dr Fletcher Anon. He discontinued his amiodarone. He was continue on Eliquis which was felt possibly a long term medication given burden of PE. He mentioned putting him on a class IC AAD if afib recurred.   He has continued to have knee pain and was recently admitted 10/9-10/13/17 for infection of knee prosthesis. He underwent irrigation and debridement with poly exchange place spacers on 10/30/15. Cultures came with with  coagulase negative Staph and he was placed on IV vanc through a PICC line. Patient was given sequential compression devices, early ambulation, and chemoprophylaxis to prevent DVT.  He was doing okay recovering at home until last Thursday night 11/09/15 when he started noticing indigestion like chest pain that felt like he might feel better if he vomited. Then last night he felt weak and jittery. He took his pulse which was in the 180s and called EMS. He was found to be in afib with RVR. He spontaneously converted but still has chest pressure that is much worse with a deep breath in. Not as bad as when he had a PE which was like a severe stabbing pain. CTA in ER negative for PE. Currently feeling okay. No LE edema, orthopnea or PND. No dizziness or syncope. States " I just want to go home."   Problem List  Past Medical History:  Diagnosis Date  . Arthritis   . Arthrofibrosis of total knee arthroplasty ([place]) 05/30/2015  . Bilateral pulmonary embolism ([place])    a. 03/2015 CTA: acute bilat PE.  Marland Kitchen Chest pain    a. 2015 Abnl MV;  b. 2015 Cath: nl cors.  . Chronic diastolic CHF (congestive heart failure) ([place])    a. 03/2015 Echo: EF 55-65%, poor windows, mildly dil RV.  Marland Kitchen DVT (deep venous thrombosis) ([place])    a. 03/2015 U/S: occlusive DVT w/in the distal aspect of the Left fem vein through the L popliteal vein.  Marland Kitchen Dysrhythmia    PAF  . GERD (gastroesophageal reflux disease)   .  Headache   . Hypertension   . Hypertensive heart disease   . Obesity   . OSA (obstructive sleep apnea)    uses CPAP nightly  . Primary localized osteoarthritis of left knee    a. 11/2014 s/p L TKA.  . Primary localized osteoarthritis of right knee    a. 02/2014 s/p R Partial Knee arthroplasty.  . Stiffness of left knee    a. 12/2014 s/p manipulation under anesthesia.  . Urine incontinence     Past Surgical History:  Procedure Laterality Date  . APPENDECTOMY    . CARDIAC CATHETERIZATION    . EXAM UNDER ANESTHESIA WITH  MANIPULATION OF KNEE Left 05/30/2015   Procedure: EXAM UNDER ANESTHESIA WITH MANIPULATION OF LEFT KNEE;  Surgeon: Marchia Bond, MD;  Location: [place];  Service: Orthopedics;  Laterality: Left;  . I&D KNEE WITH POLY EXCHANGE Left 10/30/2015   Procedure: IRRIGATION AND DEBRIDEMENT KNEE WITH POLY EXCHANGE PLACE SPACERS;  Surgeon: Frederik Pear, MD;  Location: [place];  Service: Orthopedics;  Laterality: Left;  OFF ELIQUIST 3 DAYS  . IVC FILTER PLACEMENT ([facility] HX)  04/13/15  . JOINT REPLACEMENT    . KNEE ARTHROSCOPY Left 08/08/2015   Procedure: LEFT KNEE MANIPULATION WITH ARTHROSCOPIC LYSIS OF ADHESIONS AND ASPIRATION;  Surgeon: Marchia Bond, MD;  Location: [place];  Service: Orthopedics;  Laterality: Left;  . KNEE CLOSED REDUCTION Left 01/20/2015   Procedure: LEFT KNEE MANIPULATION;  Surgeon: Marchia Bond, MD;  Location: [place];  Service: Orthopedics;  Laterality: Left;  . LEFT HEART CATHETERIZATION WITH CORONARY ANGIOGRAM N/A 01/12/2014   Procedure: LEFT HEART CATHETERIZATION WITH CORONARY ANGIOGRAM;  Surgeon: Jettie Booze, MD;  Location: [facility] CATH LAB;  Service: Cardiovascular;  Laterality: N/A;  . ORTHOPEDIC SURGERY Left    arthroscopy x3  . PARTIAL KNEE ARTHROPLASTY Right 02/25/2014   Procedure: RIGHT KNEE ARTHROPLASTY CONDYLE AND PLATEAU MEDIAL COMPARTMENT ;  Surgeon: Johnny Bridge, MD;  Location: [place];  Service: Orthopedics;  Laterality: Right;  . PERIPHERAL VASCULAR CATHETERIZATION N/A 03/29/2015   Procedure: IVC Filter Insertion, and possible thrombectomy;  Surgeon: Katha Cabal, MD;  Location: [place] CV LAB;  Service: Cardiovascular;  Laterality: N/A;  . TOTAL KNEE ARTHROPLASTY  12/06/2014   Procedure: TOTAL KNEE ARTHROPLASTY;  Surgeon: Marchia Bond, MD;  Location: [place] OR;  Service: Orthopedics;;     Allergies  Allergies  Allergen Reactions  . Penicillins Other (See Comments)    Received ancef 3gms with no obvious reaction. 08/08/15 Has  patient had a PCN reaction causing immediate rash, facial/tongue/throat swelling, SOB or lightheadedness with hypotension: Unknown, childhood reaction Has patient had a PCN reaction causing severe rash involving mucus membranes or skin necrosis: No Has patient had a PCN reaction that required hospitalization No Has patient had a PCN reaction occurring within the last 10 years: No If all of the above answers are "NO", then may proceed with Ce     Home Medications  Prior to Admission medications   Medication Sig Start Date End Date Taking? Authorizing Provider  apixaban (ELIQUIS) 5 MG TABS tablet Take 1 tablet (5 mg total) by mouth 2 (two) times daily. 04/30/15  Yes Isaiah Serge, NP  baclofen (LIORESAL) 10 MG tablet Take 1 tablet (10 mg total) by mouth 3 (three) times daily as needed for muscle spasms. 05/30/15  Yes Marchia Bond, MD  esomeprazole (NEXIUM) 40 MG capsule Take 40 mg by mouth daily as needed (for acid reflux).    Yes  Historical Provider, MD  metoprolol succinate (TOPROL-XL) 25 MG 24 hr tablet Take 1 tablet (25 mg total) by mouth 2 (two) times daily. Take with or immediately following a meal. 06/12/15  Yes Minna Merritts, MD  oxyCODONE-acetaminophen (ROXICET) 5-325 MG tablet Take 1 tablet by mouth every 4 (four) hours as needed. 11/01/15  Yes Leighton Parody, PA-C  traMADol (ULTRAM) 50 MG tablet Take 50 mg by mouth every 6 (six) hours as needed for moderate pain.   Yes Historical Provider, MD  vancomycin 1,250 mg in sodium chloride 0.9 % 250 mL Inject 1,250 mg into the vein every 12 (twelve) hours. 11/03/15  Yes Leighton Parody, PA-C    Family History  Family History  Problem Relation Age of Onset  . Coronary artery disease Mother   . Hyperlipidemia Mother   . Hypertension Mother   . Arthritis Mother   . Coronary artery disease Father   . Heart disease Paternal Grandmother   . Alzheimer's disease Paternal Grandfather       Family Status  Relation Status  . Mother Alive    . Father Alive  . Brother Alive  . Maternal Grandmother Deceased  . Maternal Grandfather Deceased  . Paternal Grandmother Deceased  . Paternal Grandfather Deceased     Social History  Social History   Social History  . Marital status: Married    Spouse name: N/A  . Number of children: N/A  . 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   Social History Main Topics  . Smoking status: Never Smoker  . Smokeless tobacco: Never Used  . Alcohol use Yes     Comment: occassional  . Drug use: No  . Sexual activity: Yes   Other Topics Concern  . Not on file   Social History Narrative  . No narrative on file     Review of Systems General:  No chills, fever, night sweats or weight changes. Cardiovascular:  ++ chest pain, No dyspnea on exertion, edema, orthopnea, palpitations, paroxysmal nocturnal dyspnea. Dermatological: No rash, lesions/masses Respiratory: No cough, dyspnea Urologic: No hematuria, dysuria Abdominal:   No nausea, vomiting, diarrhea, bright red blood per rectum, melena, or hematemesis Neurologic:  No visual changes, wkns, changes in mental status. All other systems reviewed and are otherwise negative except as noted above.  Physical Exam  Blood pressure 116/68, pulse (!) 59, temperature 99 F (37.2 C), temperature source Oral, resp. rate 13, height 5\' 9"  (1.753 m), weight 266 lb (120.7 kg), SpO2 98 %.  General: Pleasant, NAD obese Psych: Normal affect. Neuro: Alert and oriented X 3. Moves all extremities spontaneously. HEENT: Normal  Neck: Supple without bruits or JVD. Lungs:  Resp regular and unlabored, CTA. Heart: RRR no s3, s4, or murmurs. Abdomen: Soft, non-tender, non-distended, BS + x 4.  Extremities: No clubbing, cyanosis or edema. DP/PT/Radials 2+ and equal bilaterally.  Labs  No results for input(s): CKTOTAL, CKMB, TROPONINI in the last 72 hours. Lab Results  Component Value Date   WBC 7.1 11/11/2015   HGB 9.4 (L)  11/11/2015   HCT 30.6 (L) 11/11/2015   MCV 82.3 11/11/2015   PLT 302 11/11/2015    Recent Labs Lab 11/11/15 0334  NA 139  K 4.0  CL 108  CO2 25  BUN 9  CREATININE 1.20  CALCIUM 8.4*  GLUCOSE 131*   Lab Results  Component Value Date   CHOL 124 03/29/2015   HDL 30 (L) 03/29/2015  [place] 80 03/29/2015   TRIG 71 03/29/2015   Lab Results  Component Value Date   DDIMER 1.08 (H) 05/10/2015     Radiology/Studies  Dg Chest 2 View  Result Date: 11/11/2015 CLINICAL DATA:  Intermittent chest pressure beginning today with abdominal pain. Tachycardia. History of hypertension, CHF. EXAM: CHEST  2 VIEW COMPARISON:  Chest radiograph November 07, 2015 FINDINGS: RIGHT PICC distal tip projects in mid to distal superior vena cava. No pneumothorax. Cardiomediastinal silhouette is normal. No pleural effusion or focal consolidation. No pneumothorax. Soft tissue planes included osseous structures are nonsuspicious. Mild degenerative change of thoracic spine. IMPRESSION: No acute cardiopulmonary process. Stable position of RIGHT PICC. Electronically Signed   By: Elon Alas M.D.   On: 11/11/2015 04:13   Dg Chest 2 View  Result Date: 11/07/2015 CLINICAL DATA:  54 year old male with right-sided PICC placement EXAM: CHEST  2 VIEW COMPARISON:  Chest radiograph dated 10/30/2015 FINDINGS: There has been interval placement of a right-sided PICC with tip at the cavoatrial junction. The lungs are clear. No pleural effusion or pneumothorax. Top-normal cardiac size. No acute osseous pathology. IMPRESSION: Interval placement of a right-sided PICC with tip at the cavoatrial junction. No pneumothorax. Electronically Signed   By: Anner Crete M.D.   On: 11/07/2015 04:09   Dg Chest 2 View  Result Date: 10/30/2015 CLINICAL DATA:  Preoperative examination prior to knee prosthesis removal due to infection. EXAM: CHEST  2 VIEW COMPARISON:  Chest x-ray of August 19, 2015 FINDINGS: The lungs are well-expanded  and clear. The heart and pulmonary vascularity are normal. The mediastinum is normal in width. There is no pleural effusion. There is multilevel degenerative disc disease of the thoracic spine. IMPRESSION: There is no active cardiopulmonary disease. Electronically Signed   By: David  Martinique M.D.   On: 10/30/2015 13:27   Ct Angio Chest Pe W Or Wo Contrast  Result Date: 11/11/2015 CLINICAL DATA:  Chest pain and shortness of Breath, history of pulmonary emboli EXAM: CT ANGIOGRAPHY CHEST WITH CONTRAST TECHNIQUE: Multidetector CT imaging of the chest was performed using the standard protocol during bolus administration of intravenous contrast. Multiplanar CT image reconstructions and MIPs were obtained to evaluate the vascular anatomy. CONTRAST:  100 mL Isovue 370. COMPARISON:  08/19/2015 FINDINGS: Cardiovascular: The thoracic aorta is stable in appearance. The cardiac structures as visualized are within normal limits. The pulmonary artery again shows a normal branching pattern. No new focal filling defects are identified to suggest pulmonary embolism. The previously seen chronic pulmonary emboli have resolved in the interval. Mediastinum/Nodes: The thoracic inlet is within normal limits. There are scattered mediastinal lymph nodes in the AP window, right paratracheal region an sub carinal region although which have increased in size when compared with the prior exam. The previously seen hilar adenopathy is again present and also more prominent than that seen on the prior exam. The esophagus as visualize is within normal limits. Right-sided PICC line is again identified and stable. Lungs/Pleura: The lungs are well aerated bilaterally. No focal infiltrate or sizable effusion is seen. No parenchymal nodules are noted. Upper Abdomen: The visualized upper abdomen is within normal limits. Musculoskeletal: Degenerative changes of the thoracic spine are noted. Review of the MIP images confirms the above findings.  IMPRESSION: No evidence of pulmonary emboli. Previously seen chronic pulmonary emboli have resolved in the interval. Hilar and mediastinal adenopathy is again identified but slightly increased in size when compared with the prior exam. Clinical correlation is recommended. No new focal abnormality is  noted. Electronically Signed   By: Inez Catalina M.D.   On: 11/11/2015 07:09    2D ECHO: 03/27/2015 LV EF: 55% -   65% Study Conclusions - Procedure narrative: Transthoracic echocardiography. Image   quality was suboptimal. The study was technically difficult, as a   result of poor acoustic windows, poor sound wave transmission,   and body habitus. - Left ventricle: Systolic function was normal. The estimated   ejection fraction was in the range of 55% to 65%. - Aortic valve: Valve area (Vmax): 3.3 cm^2. - Right ventricle: The cavity size was mildly dilated   ECG  NSR HR 86  ASSESSMENT AND PLAN  ADORIAN CIRILO is a 55 y.o. male with a history of PAF, HTN, obesity, history  DVT/PE who [facility]15 with chest pain and palpitations and found to be in afib with RVR. He spontaneously converted by the time he go to the ER. Now has some atypical chest pain and cardiology consulted.   Afib with RVR: now spontaneously converted.  -- Continue Eliquis 5mg  BID. CHADSVASC is 1 for HTN, but patient may require this long term for DVT/PE -- Continue Toprol XL 25 mg BID. Will start pill in the pocket Flecanide 150mg  as needed and arrange follow up in afib clinic.   Chest pain: atypical for cardiac chest pain. Troponin neg x2. ECG with no acute ischemic changes. Chest pain is pleuritic but CTA fortunately negative for new acute PE. Sounds more GI and improved with GI cocktail.  DVT/PE: s/p IVC filter and on Eliquis. CTA negative for PE  Dispo: likely home today. I will arrange follow up in the office in the next 1-2 weeks in the afib clinic  Signed, Richard Form, PA-C 11/11/2015,  9:16 AM  Pager [phone]  Attending Note Patient seen and discussed with PA Grandville Silos, I agree with her documentation above. Presents with atypical epigastric/chest pain. Worst with position and palpation, better with GI cocktail. He reports poor compliance with PPI. Troponins negative, EKG without ischemic changes, CT PE negative. Also with palpitations overnight, first episode in several months. He has history of PAF, a few months ago his amio was stopped by Dr Fletcher Anon. EMS strips show afib, in ER he is back in NSR. From Dr Tyrell Antonio last note plan to start class IC agent if recurrence of afib, patient with no evidence of structural heart disease. He is already on anticoag for his history of DVT/PE. Due to infrequent symptoms, we will start pill in pocket flecanide, start with conservative dose of 150mg . He is already on av nodal agent. Arrange f/u with afib clinic. From cardiac standpoint he does not require admission. Defer any further evaluation/management of epigastric pain to ER.  I have called in Rx for flecanide 150mg  prn palpitations to his pharmacy. Defer any further workup for epigastric/chest pain to ER staff, no further cardiac testing at this time.     Zandra Abts MD

## 2015-11-11 NOTE — ED Triage Notes (Signed)
Pt states intermittent chest pain starting this evening, states hx of AFIB. EMS reports irregular rate from 80s to 140s. Pt in sinus at this time. Denies pain at this time. 324 MG Aspirin given in field.   Pt has hx of total knee replacement in L knee that resulting in infection, pt has PICC in place and takes IV vancomycin regularly.

## 2015-11-11 NOTE — ED Notes (Signed)
Patient transported to CT via stretcher.

## 2015-11-11 NOTE — ED Provider Notes (Signed)
[facility] [name] DEPT Provider Note   CSN: [id number] Arrival date & time: 11/11/15  [id number] By signing my name below, I, Richard [name], attest that this documentation has been prepared under the direction and in the presence of Richard Bruce, MD. Electronically Signed: Georgette [name], ED Scribe. 11/11/15. 3:50 AM.  History   Chief Complaint Chief Complaint  Patient presents with  . Chest Pain   HPI Comments: Richard [name] is a 55 y.o. male with h/o A-fib and HTN who presents to the Emergency Department by EMS complaining of intermittent, pressurized chest pain onset yesterday afternoon with associated abdominal pain. Pt states he felt like he was "eating too much". Pain is reproducible with deep breathing. He has taken Nexium and Pepto Bismol with no relief. Pt reports irregular heart rate going as high as 183. He was given 324 mg ASA by EMS en route. Pt has h/o total left knee replacement resulting in infection; pt currently has a PICC in place and takes IV vancomycin regularly. Pt has been compliant with his medications. Pt denies fever. He states he is in no pain at this time.   The history is provided by the patient and the EMS personnel. No language interpreter was used.    Past Medical History:  Diagnosis Date  . Arthritis   . Arthrofibrosis of total knee arthroplasty ([place]) 05/30/2015  . Bilateral pulmonary embolism ([place])    a. 03/2015 CTA: acute bilat PE.  Richard [name] Chest pain    a. 2015 Abnl MV;  b. 2015 Cath: nl cors.  . Chronic diastolic CHF (congestive heart failure) ([place])    a. 03/2015 Echo: EF 55-65%, poor windows, mildly dil RV.  Richard [name] DVT (deep venous thrombosis) ([place])    a. 03/2015 U/S: occlusive DVT w/in the distal aspect of the Left fem vein through the L popliteal vein.  Richard [name] Dysrhythmia    PAF  . GERD (gastroesophageal reflux disease)   . Headache   . Hypertension   . Hypertensive heart disease   . Obesity   . OSA (obstructive sleep apnea)    uses CPAP nightly  . Primary localized  osteoarthritis of left knee    a. 11/2014 s/p L TKA.  . Primary localized osteoarthritis of right knee    a. 02/2014 s/p R Partial Knee arthroplasty.  . Stiffness of left knee    a. 12/2014 s/p manipulation under anesthesia.  . Urine incontinence     Patient Active Problem List   Diagnosis Date Noted  . Unsteadiness on feet   . Coagulase-negative staphylococcal infection   . Infection of total left knee replacement ([place]) 10/30/2015  . 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 Arthrofibrosis of total knee arthroplasty ([place]) 05/30/2015  . PAF (paroxysmal atrial fibrillation) ([place]) 03/30/2015  . Peroneal DVT (deep venous thrombosis) ([place])   . Pulmonary embolism ([place]) 03/27/2015  . Hypertension 08/17/2011  . LVH (left ventricular hypertrophy) 08/17/2011  . GERD (gastroesophageal reflux disease) 08/17/2011  . Morbid obesity ([place]) 08/16/2011  . OSA (obstructive sleep apnea) 08/16/2011    Past Surgical History:  Procedure Laterality Date  . APPENDECTOMY    . CARDIAC CATHETERIZATION    . EXAM UNDER ANESTHESIA WITH MANIPULATION OF KNEE Left 05/30/2015   Procedure: EXAM UNDER ANESTHESIA WITH MANIPULATION OF LEFT KNEE;  Surgeon: Marchia Bond, MD;  Location: [facility];  Service: Orthopedics;  Laterality: Left;  . I&D KNEE WITH POLY EXCHANGE Left 10/30/2015   Procedure: IRRIGATION AND DEBRIDEMENT KNEE WITH POLY EXCHANGE PLACE  SPACERS;  Surgeon: Frederik Pear, MD;  Location: [facility];  Service: Orthopedics;  Laterality: Left;  OFF ELIQUIST 3 DAYS  . IVC FILTER PLACEMENT ([facility] HX)  04/13/15  . JOINT REPLACEMENT    . KNEE ARTHROSCOPY Left 08/08/2015   Procedure: LEFT KNEE MANIPULATION WITH ARTHROSCOPIC LYSIS OF ADHESIONS AND ASPIRATION;  Surgeon: Marchia Bond, MD;  Location: [facility];  Service: Orthopedics;  Laterality: Left;  . KNEE CLOSED REDUCTION Left 01/20/2015   Procedure: LEFT KNEE MANIPULATION;  Surgeon: Marchia Bond, MD;  Location: [facility];   Service: Orthopedics;  Laterality: Left;  . LEFT HEART CATHETERIZATION WITH CORONARY ANGIOGRAM N/A 01/12/2014   Procedure: LEFT HEART CATHETERIZATION WITH CORONARY ANGIOGRAM;  Surgeon: Jettie Booze, MD;  Location: [facility] CATH LAB;  Service: Cardiovascular;  Laterality: N/A;  . ORTHOPEDIC SURGERY Left    arthroscopy x3  . PARTIAL KNEE ARTHROPLASTY Right 02/25/2014   Procedure: RIGHT KNEE ARTHROPLASTY CONDYLE AND PLATEAU MEDIAL COMPARTMENT ;  Surgeon: Johnny Bridge, MD;  Location: [facility];  Service: Orthopedics;  Laterality: Right;  . PERIPHERAL VASCULAR CATHETERIZATION N/A 03/29/2015   Procedure: IVC Filter Insertion, and possible thrombectomy;  Surgeon: Katha Cabal, MD;  Location: [place] CV LAB;  Service: Cardiovascular;  Laterality: N/A;  . TOTAL KNEE ARTHROPLASTY  12/06/2014   Procedure: TOTAL KNEE ARTHROPLASTY;  Surgeon: Marchia Bond, MD;  Location: [facility];  Service: Orthopedics;;       Home Medications    Prior to Admission medications   Medication Sig Start Date End Date Taking? Authorizing Provider  apixaban (ELIQUIS) 5 MG TABS tablet Take 1 tablet (5 mg total) by mouth 2 (two) times daily. 04/30/15  Yes Isaiah Serge, NP  baclofen (LIORESAL) 10 MG tablet Take 1 tablet (10 mg total) by mouth 3 (three) times daily as needed for muscle spasms. 05/30/15  Yes Marchia Bond, MD  esomeprazole (NEXIUM) 40 MG capsule Take 40 mg by mouth daily as needed (for acid reflux).    Yes Historical Provider, MD  metoprolol succinate (TOPROL-XL) 25 MG 24 hr tablet Take 1 tablet (25 mg total) by mouth 2 (two) times daily. Take with or immediately following a meal. 06/12/15  Yes Minna Merritts, MD  oxyCODONE-acetaminophen (ROXICET) 5-325 MG tablet Take 1 tablet by mouth every 4 (four) hours as needed. 11/01/15  Yes Leighton Parody, PA-C  traMADol (ULTRAM) 50 MG tablet Take 50 mg by mouth every 6 (six) hours as needed for moderate pain.   Yes Historical Provider, MD    vancomycin 1,250 mg in sodium chloride 0.9 % 250 mL Inject 1,250 mg into the vein every 12 (twelve) hours. 11/03/15  Yes Leighton Parody, PA-C    Family History Family History  Problem Relation Age of Onset  . Coronary artery disease Mother   . Hyperlipidemia Mother   . Hypertension Mother   . Arthritis Mother   . Coronary artery disease Father   . Heart disease Paternal Grandmother   . Alzheimer's disease Paternal Grandfather     Social History Social History  Substance Use Topics  . Smoking status: Never Smoker  . Smokeless tobacco: Never Used  . Alcohol use Yes     Comment: occassional     Allergies   Penicillins   Review of Systems Review of Systems  Constitutional: Positive for fatigue. Negative for chills and fever.  HENT: Negative for congestion and rhinorrhea.   Eyes: Negative for visual disturbance.  Respiratory: Negative for cough, shortness of  breath and wheezing.   Cardiovascular: Positive for chest pain and palpitations. Negative for leg swelling.  Gastrointestinal: Positive for abdominal pain. Negative for diarrhea, nausea and vomiting.  Genitourinary: Negative for dysuria and flank pain.  Musculoskeletal: Positive for gait problem. Negative for neck pain and neck stiffness.  Skin: Negative for rash and wound.  Allergic/Immunologic: Negative for immunocompromised state.  Neurological: Negative for syncope, weakness and headaches.  All other systems reviewed and are negative.    Physical Exam Updated Vital Signs BP (!) 92/41   Pulse 72   Temp 99 F (37.2 C) (Oral)   Resp 18   Ht 5\' 9"  (1.753 m)   Wt 266 lb (120.7 kg)   SpO2 97%   BMI 39.28 kg/m   Physical Exam  Constitutional: He is oriented to person, place, and time. He appears well-developed and well-nourished. No distress.  HENT:  Head: Normocephalic and atraumatic.  Eyes: Conjunctivae are normal.  Neck: Neck supple.  Cardiovascular: Normal rate, regular rhythm and normal heart  sounds.  Exam reveals no friction rub.   No murmur heard. Pulmonary/Chest: Effort normal and breath sounds normal. No respiratory distress. He has no wheezes. He has no rales.  Abdominal: He exhibits no distension.  Musculoskeletal: He exhibits no edema.  Neurological: He is alert and oriented to person, place, and time. He exhibits normal muscle tone.  Skin: Skin is warm. Capillary refill takes less than 2 seconds.  Psychiatric: He has a normal mood and affect.  Nursing note and vitals reviewed.    ED Treatments / Results  DIAGNOSTIC STUDIES: Oxygen Saturation is 99% on RA, normal by my interpretation.    COORDINATION OF CARE: 3:50 AM Discussed treatment plan with pt at bedside which includes lab work and pt agreed to plan.  Labs (all labs ordered are listed, but only abnormal results are displayed) Labs Reviewed  BASIC METABOLIC PANEL - Abnormal; Notable for the following:       Result Value   Glucose, Bld 131 (*)    Calcium 8.4 (*)    All other components within normal limits  CBC - Abnormal; Notable for the following:    RBC 3.72 (*)    Hemoglobin 9.4 (*)    HCT 30.6 (*)    MCH 25.3 (*)    All other components within normal limits  MAGNESIUM  I-STAT TROPOININ, ED  I-STAT TROPOININ, ED    EKG  EKG Interpretation  Date/Time:  Saturday November 11 2015 03:34:27 EDT Ventricular Rate:  86 PR Interval:    QRS Duration: 95 QT Interval:  364 QTC Calculation: 436 R Axis:   -66 Text Interpretation:  Sinus rhythm LAD, consider left anterior fascicular block Baseline wander in lead(s) III No significant change since last tracing Confirmed by Billie Intriago MD, Lysbeth Galas [phone]) on 11/11/2015 7:12:46 AM       Radiology Dg Chest 2 View  Result Date: 11/11/2015 CLINICAL DATA:  Intermittent chest pressure beginning today with abdominal pain. Tachycardia. History of hypertension, CHF. EXAM: CHEST  2 VIEW COMPARISON:  Chest radiograph November 07, 2015 FINDINGS: RIGHT PICC distal tip  projects in mid to distal superior vena cava. No pneumothorax. Cardiomediastinal silhouette is normal. No pleural effusion or focal consolidation. No pneumothorax. Soft tissue planes included osseous structures are nonsuspicious. Mild degenerative change of thoracic spine. IMPRESSION: No acute cardiopulmonary process. Stable position of RIGHT PICC. Electronically Signed   By: Elon Alas M.D.   On: 11/11/2015 04:13   Ct Angio Chest Pe W Or Wo  Contrast  Result Date: 11/11/2015 CLINICAL DATA:  Chest pain and shortness of Breath, history of pulmonary emboli EXAM: CT ANGIOGRAPHY CHEST WITH CONTRAST TECHNIQUE: Multidetector CT imaging of the chest was performed using the standard protocol during bolus administration of intravenous contrast. Multiplanar CT image reconstructions and MIPs were obtained to evaluate the vascular anatomy. CONTRAST:  100 mL Isovue 370. COMPARISON:  08/19/2015 FINDINGS: Cardiovascular: The thoracic aorta is stable in appearance. The cardiac structures as visualized are within normal limits. The pulmonary artery again shows a normal branching pattern. No new focal filling defects are identified to suggest pulmonary embolism. The previously seen chronic pulmonary emboli have resolved in the interval. Mediastinum/Nodes: The thoracic inlet is within normal limits. There are scattered mediastinal lymph nodes in the AP window, right paratracheal region an sub carinal region although which have increased in size when compared with the prior exam. The previously seen hilar adenopathy is again present and also more prominent than that seen on the prior exam. The esophagus as visualize is within normal limits. Right-sided PICC line is again identified and stable. Lungs/Pleura: The lungs are well aerated bilaterally. No focal infiltrate or sizable effusion is seen. No parenchymal nodules are noted. Upper Abdomen: The visualized upper abdomen is within normal limits. Musculoskeletal: Degenerative  changes of the thoracic spine are noted. Review of the MIP images confirms the above findings. IMPRESSION: No evidence of pulmonary emboli. Previously seen chronic pulmonary emboli have resolved in the interval. Hilar and mediastinal adenopathy is again identified but slightly increased in size when compared with the prior exam. Clinical correlation is recommended. No new focal abnormality is noted. Electronically Signed   By: Inez Catalina M.D.   On: 11/11/2015 07:09    Procedures Procedures (including critical care time)  Medications Ordered in ED Medications  sodium chloride 0.9 % bolus 1,000 mL (1,000 mLs Intravenous New Bag/Given 11/11/15 0446)  iopamidol (ISOVUE-370) 76 % injection (100 mLs  Contrast Given 11/11/15 [id number])     Initial Impression / Assessment and Plan / ED Course  I have reviewed the triage vital signs and the nursing notes.  Pertinent labs & imaging results that were available during my care of the patient were reviewed by me and considered in my medical decision making (see chart for details).  Clinical Course    55 yo M with PMHx of AFib on Eliquis, HTN, HLD who p/w sharp, pleuritic CP along with palpitations, flushed feeling this morning. On EMS arrival, pt tachycardic to 160-180s with AFib RVR. HR now improved. Labs unremarkable - WBC normal, Hgn 9.4 at baseline. Renal function is normal. Trop negative. Will check CTA PE given h/o PE, consult Cards given recurrent, symptomatic SVT (previously on amio, reportedly has not had an episode since his initial PE).  CTA PE negative for acute abnormality. Pt now in normal sinus rhythm. Will discuss with Cardiology regarding some sx suggestive of angina with return of AFib/SVT. Dispo pending per cardiology.  Final Clinical Impressions(s) / ED Diagnoses   Final diagnoses:  Chest pain  Chest pain, unspecified type  SVT (supraventricular tachycardia) ([place])   I personally performed the services described in this  documentation, which was scribed in my presence. The recorded information has been reviewed and is accurate.    Richard Bruce, MD 11/11/15 [phone]

## 2015-11-11 NOTE — ED Notes (Signed)
Patient transported to CT 

## 2015-11-11 NOTE — ED Notes (Signed)
Patient returned from CT

## 2015-11-13 ENCOUNTER — Ambulatory Visit (INDEPENDENT_AMBULATORY_CARE_PROVIDER_SITE_OTHER): Payer: Managed Care, Other (non HMO) | Admitting: Physician Assistant

## 2015-11-13 ENCOUNTER — Encounter: Payer: Self-pay | Admitting: Physician Assistant

## 2015-11-13 VITALS — BP 110/60 | HR 94 | Ht 69.0 in | Wt 266.0 lb

## 2015-11-13 DIAGNOSIS — R0789 Other chest pain: Secondary | ICD-10-CM

## 2015-11-13 DIAGNOSIS — I2699 Other pulmonary embolism without acute cor pulmonale: Secondary | ICD-10-CM

## 2015-11-13 DIAGNOSIS — Z0389 Encounter for observation for other suspected diseases and conditions ruled out: Secondary | ICD-10-CM | POA: Diagnosis not present

## 2015-11-13 DIAGNOSIS — I480 Paroxysmal atrial fibrillation: Secondary | ICD-10-CM

## 2015-11-13 DIAGNOSIS — IMO0001 Reserved for inherently not codable concepts without codable children: Secondary | ICD-10-CM

## 2015-11-13 DIAGNOSIS — I82492 Acute embolism and thrombosis of other specified deep vein of left lower extremity: Secondary | ICD-10-CM | POA: Diagnosis not present

## 2015-11-13 MED ORDER — METOPROLOL SUCCINATE ER 25 MG PO TB24: 25.0000 mg | ORAL_TABLET | Freq: Two times a day (BID) | ORAL | 3 refills | Status: DC

## 2015-11-13 MED ORDER — FLECAINIDE ACETATE 50 MG PO TABS: 50.0000 mg | ORAL_TABLET | Freq: Two times a day (BID) | ORAL | 3 refills | Status: DC

## 2015-11-13 MED ORDER — APIXABAN 5 MG PO TABS: 5.0000 mg | ORAL_TABLET | Freq: Two times a day (BID) | ORAL | 3 refills | Status: DC

## 2015-11-13 NOTE — Patient Instructions (Addendum)
Medication Instructions:  Please STOP flecainide 150 mg Please START flecainide 50 mg twice daily  Please continue metoprolol if systolic BP > [id number]  Labwork: None  Testing/Procedures: [place]  Your caregiver has ordered a Stress Test with nuclear imaging. The purpose of this test is to evaluate the blood supply to your heart muscle. This procedure is referred to as a "Non-Invasive Stress Test." This is because other than having an IV started in your vein, nothing is inserted or "invades" your body. Cardiac stress tests are done to find areas of poor blood flow to the heart by determining the extent of coronary artery disease (CAD). Some patients exercise on a treadmill, which naturally increases the blood flow to your heart, while others who are  unable to walk on a treadmill due to physical limitations have a pharmacologic/chemical stress agent called Lexiscan . This medicine will mimic walking on a treadmill by temporarily increasing your coronary blood flow.   Please note: these test may take anywhere between 2-4 hours to complete  PLEASE REPORT TO [facility] AT THE FIRST DESK WILL DIRECT YOU WHERE TO GO  Date of Procedure:____Wednesday, October 25_______  Arrival Time for Procedure:______8:45 am_____________  Instructions regarding medication:   __X__:  Hold METOPROLOL the night before and morning of procedure     How to prepare for your Myoview test:  1. Do not eat or drink after midnight 2. No caffeine for 24 hours prior to test 3. No smoking 24 hours prior to test. 4. Your medication may be taken with water.  If your doctor stopped a medication because of this test, do not take that medication. 5. Ladies, please do not wear dresses.  Skirts or pants are appropriate. Please wear a short sleeve shirt. 6. No perfume, cologne or lotion.  Follow-Up: 1 month w/ Christell Faith, PA or Dr. Fletcher Anon  If you need a refill on your cardiac medications  before your next appointment, please call your pharmacy.  Cardiac Nuclear Scanning A cardiac nuclear scan is used to check your heart for problems, such as the following:  A portion of the heart is not getting enough blood.  Part of the heart muscle has died, which happens with a heart attack.  The heart wall is not working normally.  In this test, a radioactive dye (tracer) is injected into your bloodstream. After the tracer has traveled to your heart, a scanning device is used to measure how much of the tracer is absorbed by or distributed to various areas of your heart. LET [facility] CARE PROVIDER KNOW ABOUT:  Any allergies you have.  All medicines you are taking, including vitamins, herbs, eye drops, creams, and over-the-counter medicines.  Previous problems you or members of your family have had with the use of anesthetics.  Any blood disorders you have.  Previous surgeries you have had.  Medical conditions you have.  RISKS AND COMPLICATIONS Generally, this is a safe procedure. However, as with any procedure, problems can occur. Possible problems include:   Serious chest pain.  Rapid heartbeat.  Sensation of warmth in your chest. This usually passes quickly. BEFORE THE PROCEDURE Ask your health care provider about changing or stopping your regular medicines. PROCEDURE This procedure is usually done at a hospital and takes 2-4 hours.  An IV tube is inserted into one of your veins.  Your health care provider will inject a small amount of radioactive tracer through the tube.  You will then wait  for 20-40 minutes while the tracer travels through your bloodstream.  You will lie down on an exam table so images of your heart can be taken. Images will be taken for about 15-20 minutes.  You will exercise on a treadmill or stationary bike. While you exercise, your heart activity will be monitored with an electrocardiogram (ECG), and your blood pressure will be  checked.  If you are unable to exercise, you may be given a medicine to make your heart beat faster.  When blood flow to your heart has peaked, tracer will again be injected through the IV tube.  After 20-40 minutes, you will get back on the exam table and have more images taken of your heart.  When the procedure is over, your IV tube will be removed. AFTER THE PROCEDURE  You will likely be able to leave shortly after the test. Unless your health care provider tells you otherwise, you may return to your normal schedule, including diet, activities, and medicines.  Make sure you find out how and when you will get your test results.   This information is not intended to replace advice given to you by your health care provider. Make sure you discuss any questions you have with your health care provider.   Document Released: 02/02/2004 Document Revised: 01/12/2013 Document Reviewed: 12/16/2012 Elsevier Interactive Patient Education [employer].

## 2015-11-13 NOTE — Progress Notes (Signed)
Cardiology Office Note Date:  11/13/2015  Patient ID:  Richard [name], Richard [name] 06/01/1960, MRN [id number] PCP:  Webb Silversmith, NP  Cardiologist:  Dr. Fletcher Anon, MD    Chief Complaint: ED follow up  History of Present Illness: Richard [name] is a 55 y.o. male with history of normal coronary arteries by cardiac cath in 2015, Afib in the setting of massive pulmonary embolism in 03/2015 on Eliquis, HTN, obesity, osteoarthritis s/p bilateral knee surgeries who presents for ED follow up for palpitations.   In 2015 he had an abnormal stress test which led to a cardiac cath that showed normal coronary arerties. He underwent right partial knee arthroplasty in 02/2014 follwed by left TKA in 11/2014. His mobility was limited after that and he had the flu in 02/2015. In early 03/2015 he presented with bilateral PE's. Lower extremity ultrasound showed left lwoer extremity DVT. Echo showed normal LV systolic function with mildly dilated right ventricle. He was treated with short-term amiodarone for development of Afib and underwent catheter-based intervention for his pulmonary embolism as well as IVC filter placement. His amiodarone was discontinued in 06/2015 and at follow up on 10/02/2015 he had not had any further arrhythmia. He was noted to have mild exertional dyspnea, though his mobility was still quite limited due to inability to fully extend his left leg. It was recommended he continue likely lifelong anticoagulation given his PE burden as well as his continued immobility with recommendation of hypercoagulable workup.   He was recently admitted to the hospital on 10/30/2015 for operative treatment of infection associated wtih internal left knee prosthesis s/p I&D with poly exchange spacers. He was seen in the ED on 10/21 with complaints of intermittent chest pain with associated abdominal pain. Pain was reproducible with deep breathing. There was also some assocaited palpitations with a heart rate in the 180's bmp  at home. EKG non-acute, sinus rhythm. Labs showed negative i-stat troponin x 2, magnesium 1.8, K+ 4.0, SCr 1.20, hgb 9.4, wbc 7.1, CXR negative, CTA PE protocol without evidence of PE with resolution of previously seen pulmonary emboli when compared to prior study. Hilar and mediastinal adenopathy was again noted. No evidence of arrhythmia seen while in the ED. He was discharged with prn flecainide from the ED.  He comes in today stating he has had to take flecainide x 1 on 10/21 after coming home from the ED for a heart rate in the 130's bpm with associated palpitations. He has not taken Toprol XL 25 mg today given a SBP of 100 mmHg this morning when HHRN checked his BP. He has not felt any palpitations since the evening of 10/21. Secondly, he has noted intermittent upper abdominal/lower chest pain that is worse with deep inspiration and reproducible to palpation. Pain will last only for a few seconds when he has it and self resolve. He reports "it feels like gas." Currently without abdominal/chest pain. He continues to have increased pain along the left knee and remains in vancomycin via his PICC line. He continues with his PT exercises.    Past Medical History:  Diagnosis Date  . Arthritis   . Arthrofibrosis of total knee arthroplasty ([place]) 05/30/2015  . Bilateral pulmonary embolism ([place])    a. 03/2015 CTA: acute bilat PE.  Marland Kitchen Chest pain    a. 2015 Abnl MV;  b. 2015 Cath: nl cors.  . Chronic diastolic CHF (congestive heart failure) ([place])    a. 03/2015 Echo: EF 55-65%, poor windows, mildly dil RV.  Marland Kitchen  DVT (deep venous thrombosis) ([place])    a. 03/2015 U/S: occlusive DVT w/in the distal aspect of the Left fem vein through the L popliteal vein.  Marland Kitchen Dysrhythmia    PAF  . GERD (gastroesophageal reflux disease)   . Headache   . Hypertension   . Hypertensive heart disease   . Obesity   . OSA (obstructive sleep apnea)    uses CPAP nightly  . Primary localized osteoarthritis of left knee    a. 11/2014 s/p  L TKA.  . Primary localized osteoarthritis of right knee    a. 02/2014 s/p R Partial Knee arthroplasty.  . Stiffness of left knee    a. 12/2014 s/p manipulation under anesthesia.  . Urine incontinence     Past Surgical History:  Procedure Laterality Date  . APPENDECTOMY    . CARDIAC CATHETERIZATION    . EXAM UNDER ANESTHESIA WITH MANIPULATION OF KNEE Left 05/30/2015   Procedure: EXAM UNDER ANESTHESIA WITH MANIPULATION OF LEFT KNEE;  Surgeon: Marchia Bond, MD;  Location: [place];  Service: Orthopedics;  Laterality: Left;  . I&D KNEE WITH POLY EXCHANGE Left 10/30/2015   Procedure: IRRIGATION AND DEBRIDEMENT KNEE WITH POLY EXCHANGE PLACE SPACERS;  Surgeon: Frederik Pear, MD;  Location: [place];  Service: Orthopedics;  Laterality: Left;  OFF ELIQUIST 3 DAYS  . IVC FILTER PLACEMENT ([facility] HX)  04/13/15  . JOINT REPLACEMENT    . KNEE ARTHROSCOPY Left 08/08/2015   Procedure: LEFT KNEE MANIPULATION WITH ARTHROSCOPIC LYSIS OF ADHESIONS AND ASPIRATION;  Surgeon: Marchia Bond, MD;  Location: [place];  Service: Orthopedics;  Laterality: Left;  . KNEE CLOSED REDUCTION Left 01/20/2015   Procedure: LEFT KNEE MANIPULATION;  Surgeon: Marchia Bond, MD;  Location: [place];  Service: Orthopedics;  Laterality: Left;  . LEFT HEART CATHETERIZATION WITH CORONARY ANGIOGRAM N/A 01/12/2014   Procedure: LEFT HEART CATHETERIZATION WITH CORONARY ANGIOGRAM;  Surgeon: Jettie Booze, MD;  Location: [facility] CATH LAB;  Service: Cardiovascular;  Laterality: N/A;  . ORTHOPEDIC SURGERY Left    arthroscopy x3  . PARTIAL KNEE ARTHROPLASTY Right 02/25/2014   Procedure: RIGHT KNEE ARTHROPLASTY CONDYLE AND PLATEAU MEDIAL COMPARTMENT ;  Surgeon: Johnny Bridge, MD;  Location: [place];  Service: Orthopedics;  Laterality: Right;  . PERIPHERAL VASCULAR CATHETERIZATION N/A 03/29/2015   Procedure: IVC Filter Insertion, and possible thrombectomy;  Surgeon: Katha Cabal, MD;  Location: [place] CV LAB;   Service: Cardiovascular;  Laterality: N/A;  . TOTAL KNEE ARTHROPLASTY  12/06/2014   Procedure: TOTAL KNEE ARTHROPLASTY;  Surgeon: Marchia Bond, MD;  Location: [place] OR;  Service: Orthopedics;;    Current Outpatient Prescriptions  Medication Sig Dispense Refill  . apixaban (ELIQUIS) 5 MG TABS tablet Take 1 tablet (5 mg total) by mouth 2 (two) times daily. 180 tablet 3  . baclofen (LIORESAL) 10 MG tablet Take 1 tablet (10 mg total) by mouth 3 (three) times daily as needed for muscle spasms. 30 each 0  . esomeprazole (NEXIUM) 40 MG capsule Take 40 mg by mouth daily as needed (for acid reflux).     . metoprolol succinate (TOPROL-XL) 25 MG 24 hr tablet Take 1 tablet (25 mg total) by mouth 2 (two) times daily. Take with or immediately following a meal. 90 tablet 3  . oxyCODONE-acetaminophen (ROXICET) 5-325 MG tablet Take 1 tablet by mouth every 4 (four) hours as needed. 60 tablet 0  . traMADol (ULTRAM) 50 MG tablet Take 50 mg by mouth every 6 (six) hours as needed  for moderate pain.    . vancomycin 1,250 mg in sodium chloride 0.9 % 250 mL Inject 1,250 mg into the vein every 12 (twelve) hours. 42 Units 1  . flecainide (TAMBOCOR) 50 MG tablet Take 1 tablet (50 mg total) by mouth 2 (two) times daily. 180 tablet 3   No current facility-administered medications for this visit.     Allergies:   Penicillins   Social History:  The patient  reports that he has never smoked. He has never used smokeless tobacco. He reports that he drinks alcohol. He reports that he does not use drugs.   Family History:  The patient's family history includes Alzheimer's disease in his paternal grandfather; Arthritis in his mother; Coronary artery disease in his father and mother; Heart disease in his paternal grandmother; Hyperlipidemia in his mother; Hypertension in his mother.  ROS:   Review of Systems  Constitutional: Positive for malaise/fatigue. Negative for chills, diaphoresis, fever and weight loss.  HENT: Negative  for congestion.   Eyes: Negative for discharge and redness.  Respiratory: Positive for shortness of breath. Negative for cough, hemoptysis, sputum production and wheezing.   Cardiovascular: Positive for palpitations. Negative for chest pain, orthopnea, claudication, leg swelling and PND.  Gastrointestinal: Positive for abdominal pain and heartburn. Negative for blood in stool, melena, nausea and vomiting.  Genitourinary: Negative for hematuria.  Musculoskeletal: Positive for joint pain and myalgias. Negative for falls.  Skin: Negative for rash.  Neurological: Positive for weakness. Negative for dizziness, tingling, tremors, sensory change, speech change, focal weakness and loss of consciousness.  Endo/Heme/Allergies: Does not bruise/bleed easily.  Psychiatric/Behavioral: Negative for substance abuse. The patient is nervous/anxious.   All other systems reviewed and are negative.    PHYSICAL EXAM:  VS:  BP 110/60 (BP Location: Left Arm, Patient Position: Sitting, Cuff Size: Normal)   Pulse 94   Ht 5\' 9"  (1.753 m)   Wt 266 lb (120.7 kg)   BMI 39.28 kg/m  BMI: Body mass index is 39.28 kg/m.  Physical Exam  Constitutional: He is oriented to person, place, and time. He appears well-developed and well-nourished.  HENT:  Head: Normocephalic and atraumatic.  Eyes: Right eye exhibits no discharge. Left eye exhibits no discharge.  Neck: Normal range of motion. No JVD present.  Cardiovascular: Normal rate, S1 normal, S2 normal and normal heart sounds.  An irregular rhythm present. Exam reveals no distant heart sounds, no friction rub, no midsystolic click and no opening snap.   No murmur heard. Pulmonary/Chest: Effort normal and breath sounds normal. No respiratory distress. He has no decreased breath sounds. He has no wheezes. He has no rales. He exhibits no tenderness.  Abdominal: Soft. He exhibits no distension. There is no tenderness.  Musculoskeletal: He exhibits no edema.  Left knee  bandaged with frequent intermittent spikes in pain. No erythema noted extending beyond the bandage. Right PICC line in place without bleeding, or erythema.   Neurological: He is alert and oriented to person, place, and time.  Skin: Skin is warm and dry. No cyanosis. Nails show no clubbing.  Psychiatric: He has a normal mood and affect. His speech is normal and behavior is normal. Judgment and thought content normal.     EKG:  Was ordered and interpreted by me today. Shows NSR with frequent PJCs, 94 bpm, poor R wave progression, no acute st/t changes   Recent Labs: 03/30/2015: TSH 1.741 06/22/2015: ALT 16 11/11/2015: BUN 9; Creatinine, Ser 1.20; Hemoglobin 9.4; Magnesium 1.8; Platelets 302; Potassium 4.0;  Sodium 139  03/29/2015: Cholesterol 124; HDL 30; LDL Cholesterol 80; Total CHOL/HDL Ratio 4.1; Triglycerides 71; VLDL 14   Estimated Creatinine Clearance: 90.3 mL/min (by C-G formula based on SCr of 1.2 mg/dL).   Wt Readings from Last 3 Encounters:  11/13/15 266 lb (120.7 kg)  11/11/15 266 lb (120.7 kg)  11/07/15 265 lb (120.2 kg)     Other studies reviewed: Additional studies/records reviewed today include: summarized above  ASSESSMENT AND PLAN:  1. Palpitations/PAF: Currently in sinus rhythm with a heart rate in the 90's bpm. Did not take Toprol XL this morning given SBP of 100 mmHg. He did have to take one prn flecainide on 10/21 secondary to a heart rate in the 130's bpm after getting home from the ED. He continues to be in significant pain with his left knee. I suspect his return of arrhythmia is likely in the setting of his increased left knee pain. Given this return of arrhythmia, in the setting of his knee pain that is not likely to improve in the near future, he would benefit from standing antiarrhythmic medication in addition to his metoprolol. Start flecainide 50 mg bid in place of prn flecainide. Continue Toprol XL 25 mg bid with hold parameters of SBP 110 mmHg. [place] Lexiscan  Myoview given hsi lower chest/abdominal pain in the setting fo starting flecainide. Recent normal echo 03/2015 and prior cardiac cath with normal coronary arteries. CHADS2VASc at least 3 (CHF, HTN, DVT/PE). Continue Eliquis 5 mg bid.   2. Chest pain/normal coronary arteries: Atypical in presentation as his pain is worse with deep inspiration and is reproducible to palpation on exam. Continue PPI. Consider GI vs MSK vs pulmonary in etiology. Schedule stress test as above. Currently without pain.   3. History of DVT/PE: Status post IVC filter. Continue Eliquis 5 mg bid, likely lifelong. Consider hypercoagulable workup if not already done.   Disposition: F/u with me in 1 month.    Current medicines are reviewed at length with the patient today.  The patient did not have any concerns regarding medicines.  Melvern Banker PA-C 11/13/2015 2:40 PM     [address] [place], [place] [id number] [phone]

## 2015-11-14 ENCOUNTER — Other Ambulatory Visit: Payer: Self-pay | Admitting: Internal Medicine

## 2015-11-14 ENCOUNTER — Ambulatory Visit (INDEPENDENT_AMBULATORY_CARE_PROVIDER_SITE_OTHER): Payer: Managed Care, Other (non HMO) | Admitting: Internal Medicine

## 2015-11-14 ENCOUNTER — Other Ambulatory Visit: Payer: Self-pay

## 2015-11-14 ENCOUNTER — Encounter: Payer: Self-pay | Admitting: Internal Medicine

## 2015-11-14 VITALS — BP 116/78 | HR 86 | Temp 97.9°F

## 2015-11-14 DIAGNOSIS — R1013 Epigastric pain: Secondary | ICD-10-CM

## 2015-11-14 DIAGNOSIS — R140 Abdominal distension (gaseous): Secondary | ICD-10-CM | POA: Diagnosis not present

## 2015-11-14 DIAGNOSIS — I480 Paroxysmal atrial fibrillation: Secondary | ICD-10-CM

## 2015-11-14 DIAGNOSIS — T84018D Broken internal joint prosthesis, other site, subsequent encounter: Secondary | ICD-10-CM | POA: Diagnosis not present

## 2015-11-14 DIAGNOSIS — T82838D Hemorrhage of vascular prosthetic devices, implants and grafts, subsequent encounter: Secondary | ICD-10-CM | POA: Diagnosis not present

## 2015-11-14 DIAGNOSIS — Z96659 Presence of unspecified artificial knee joint: Secondary | ICD-10-CM

## 2015-11-14 LAB — AMYLASE: Amylase: 49 U/L (ref 27–131)

## 2015-11-14 LAB — LIPASE: Lipase: 37 U/L (ref 11.0–59.0)

## 2015-11-14 LAB — H. PYLORI ANTIBODY, IGG: H Pylori IgG: POSITIVE — AB

## 2015-11-14 MED ORDER — APIXABAN 5 MG PO TABS: 5.0000 mg | ORAL_TABLET | Freq: Two times a day (BID) | ORAL | 3 refills | Status: DC

## 2015-11-14 MED ORDER — BISMUTH SUBSALICYLATE 262 MG PO CHEW: 524.0000 mg | CHEWABLE_TABLET | Freq: Three times a day (TID) | ORAL | 0 refills | Status: DC

## 2015-11-14 MED ORDER — METRONIDAZOLE 250 MG PO TABS: 250.0000 mg | ORAL_TABLET | Freq: Four times a day (QID) | ORAL | 0 refills | Status: DC

## 2015-11-14 MED ORDER — PANTOPRAZOLE SODIUM 40 MG PO TBEC: 40.0000 mg | DELAYED_RELEASE_TABLET | Freq: Two times a day (BID) | ORAL | 0 refills | Status: DC

## 2015-11-14 MED ORDER — TETRACYCLINE HCL 500 MG PO CAPS: 500.0000 mg | ORAL_CAPSULE | Freq: Four times a day (QID) | ORAL | 0 refills | Status: DC

## 2015-11-14 NOTE — Patient Instructions (Signed)

## 2015-11-14 NOTE — Progress Notes (Signed)
Subjective:    Patient ID: Richard [name], male    DOB: 22-Mar-1960, 55 y.o.   MRN: [id number]  HPI  Pt presents to the clinic today for hospital follow up.  He was admitted to [facility] from 10/9-10/3 for elective surgery to have his failed total left knee replacement removed, and cleansed secondary to infection. A PICC line was inserted so that he can get IV Vancomycin for weeks following his admission, to clear up the coagulase negative staph infection. No complications during that admission.  He was seen in the ER 10/17 with c/o bleeding around his RUE PICC line. Bleeding was controlled in the ER. Chest xray confirmed placement of the PICC. He was discharged home.  He went back to the ER 10/21 with c/o chest pain. Chest xray and CT chest was negative. ECG showed Afib. Cardiology was consulted but they felt like his chest pain was non cardiac in nature. He was started on Fleconide prn and advised to follow up as an outpatient. He has already followed up with cardiology. He is managed on Flecanide, Metoprolol and Eliquis. He reports ongoing pain in his lower chest/upper abdomen. It it worse with taking a deep breath. He reports he feels bloated, so much that he feels like he needs to burp, as if he ate too much. He denies nausea or vomiting. His bowels are moving normally. He has a history of GERD and is already taking Nexium, but reports it has not helped.  Review of Systems      Past Medical History:  Diagnosis Date  . Arthritis   . Arthrofibrosis of total knee arthroplasty ([place]) 05/30/2015  . Bilateral pulmonary embolism ([place])    a. 03/2015 CTA: acute bilat PE.  Marland Kitchen Chest pain    a. 2015 Abnl MV;  b. 2015 Cath: nl cors.  . Chronic diastolic CHF (congestive heart failure) ([place])    a. 03/2015 Echo: EF 55-65%, poor windows, mildly dil RV.  Marland Kitchen DVT (deep venous thrombosis) ([place])    a. 03/2015 U/S: occlusive DVT w/in the distal aspect of the Left fem vein through the L popliteal vein.  Marland Kitchen  Dysrhythmia    PAF  . GERD (gastroesophageal reflux disease)   . Headache   . Hypertension   . Hypertensive heart disease   . Obesity   . OSA (obstructive sleep apnea)    uses CPAP nightly  . Primary localized osteoarthritis of left knee    a. 11/2014 s/p L TKA.  . Primary localized osteoarthritis of right knee    a. 02/2014 s/p R Partial Knee arthroplasty.  . Stiffness of left knee    a. 12/2014 s/p manipulation under anesthesia.  . Urine incontinence     Current Outpatient Prescriptions  Medication Sig Dispense Refill  . apixaban (ELIQUIS) 5 MG TABS tablet Take 1 tablet (5 mg total) by mouth 2 (two) times daily. 180 tablet 3  . baclofen (LIORESAL) 10 MG tablet Take 1 tablet (10 mg total) by mouth 3 (three) times daily as needed for muscle spasms. 30 each 0  . esomeprazole (NEXIUM) 40 MG capsule Take 40 mg by mouth daily as needed (for acid reflux).     . flecainide (TAMBOCOR) 50 MG tablet Take 1 tablet (50 mg total) by mouth 2 (two) times daily. 180 tablet 3  . metoprolol succinate (TOPROL-XL) 25 MG 24 hr tablet Take 1 tablet (25 mg total) by mouth 2 (two) times daily. Take with or immediately following a meal. 90 tablet  3  . oxyCODONE-acetaminophen (ROXICET) 5-325 MG tablet Take 1 tablet by mouth every 4 (four) hours as needed. 60 tablet 0  . traMADol (ULTRAM) 50 MG tablet Take 50 mg by mouth every 6 (six) hours as needed for moderate pain.    . vancomycin 1,250 mg in sodium chloride 0.9 % 250 mL Inject 1,250 mg into the vein every 12 (twelve) hours. (Patient taking differently: Inject 1,750 mg into the vein every 12 (twelve) hours. ) 42 Units 1   No current facility-administered medications for this visit.     Allergies  Allergen Reactions  . Penicillins Other (See Comments)    Received ancef 3gms with no obvious reaction. 08/08/15 Has patient had a PCN reaction causing immediate rash, facial/tongue/throat swelling, SOB or lightheadedness with hypotension: Unknown, childhood  reaction Has patient had a PCN reaction causing severe rash involving mucus membranes or skin necrosis: No Has patient had a PCN reaction that required hospitalization No Has patient had a PCN reaction occurring within the last 10 years: No If all of the above answers are "NO", then may proceed with Ce    Family History  Problem Relation Age of Onset  . Coronary artery disease Mother   . Hyperlipidemia Mother   . Hypertension Mother   . Arthritis Mother   . Coronary artery disease Father   . Heart disease Paternal Grandmother   . Alzheimer's disease Paternal Grandfather     Social History   Social History  . Marital status: Married    Spouse name: N/A  . Number of children: N/A  . 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   Social History Main Topics  . Smoking status: Never Smoker  . Smokeless tobacco: Never Used  . Alcohol use Yes     Comment: occassional  . Drug use: No  . Sexual activity: Yes   Other Topics Concern  . Not on file   Social History Narrative  . No narrative on file     Constitutional: Pt reports fatigue. Denies fever, malaise, headache or abrupt weight changes.  Respiratory: Denies difficulty breathing, shortness of breath, cough or sputum production.   Cardiovascular: Denies chest pain, chest tightness, palpitations or swelling in the hands or feet.  Gastrointestinal: Pt reports abdominal pain and bloating. Denies constipation, diarrhea or blood in the stool.  GU: Denies urgency, frequency, pain with urination, burning sensation, blood in urine, odor or discharge. Musculoskeletal: Pt reports left knee pian. Denies decrease in range of motion, difficulty with gait, muscle pain or joint swelling.   No other specific complaints in a complete review of systems (except as listed in HPI above).  Objective:   Physical Exam   BP 116/78   Pulse 86   Temp 97.9 F (36.6 C) (Oral)   SpO2 97%  Wt Readings from Last 3  Encounters:  11/13/15 266 lb (120.7 kg)  11/11/15 266 lb (120.7 kg)  11/07/15 265 lb (120.2 kg)    General: Appears his stated age, obese in NAD. Skin: Incision to left knee clean, dry and intact. Cardiovascular: Normal rate and rhythm. S1,S2 noted.  No murmur, rubs or gallops noted.  Pulmonary/Chest: Normal effort and positive vesicular breath sounds. No respiratory distress. No wheezes, rales or ronchi noted.  Abdomen: Soft and tender in the epigastric region. Normal bowel sounds.  Musculoskeletal: In a wheelchair today.   BMET    Component Value Date/Time   NA 139 11/11/2015 0334   K 4.0  11/11/2015 0334   CL 108 11/11/2015 0334   CO2 25 11/11/2015 0334   GLUCOSE 131 (H) 11/11/2015 0334   BUN 9 11/11/2015 0334   CREATININE 1.20 11/11/2015 0334   CALCIUM 8.4 (L) 11/11/2015 0334   GFRNONAA >60 11/11/2015 0334   GFRAA >60 11/11/2015 0334    Lipid Panel     Component Value Date/Time   CHOL 124 03/29/2015 0434   TRIG 71 03/29/2015 0434   HDL 30 (L) 03/29/2015 0434   CHOLHDL 4.1 03/29/2015 0434   VLDL 14 03/29/2015 0434   LDLCALC 80 03/29/2015 0434    CBC    Component Value Date/Time   WBC 7.1 11/11/2015 0334   RBC 3.72 (L) 11/11/2015 0334   HGB 9.4 (L) 11/11/2015 0334   HCT 30.6 (L) 11/11/2015 0334   PLT 302 11/11/2015 0334   MCV 82.3 11/11/2015 0334   MCH 25.3 (L) 11/11/2015 0334   MCHC 30.7 11/11/2015 0334   RDW 14.9 11/11/2015 0334   LYMPHSABS 2.2 10/30/2015 1348   MONOABS 0.9 10/30/2015 1348   EOSABS 0.2 10/30/2015 1348   BASOSABS 0.0 10/30/2015 1348    Hgb A1C Lab Results  Component Value Date   HGBA1C 5.5 06/22/2015          Assessment & Plan:   Multiple hospital follow up for failed left TKR, bleeding from PICC line, epigastric pain, bloating:  Hospital notes, labs and imaging reviewed Medications reviewed Will check Amylase, Lipase, H Pylori today No medications changed now, lets wait for labs to come back  Will follow up after labs,  RTC as needed Webb Silversmith, NP

## 2015-11-15 ENCOUNTER — Ambulatory Visit
Admission: RE | Admit: 2015-11-15 | Discharge: 2015-11-15 | Disposition: A | Discharge status: Home / Self Care | Payer: Managed Care, Other (non HMO) | Source: Ambulatory Visit | Attending: Physician Assistant | Admitting: Physician Assistant

## 2015-11-15 ENCOUNTER — Encounter: Payer: Self-pay | Admitting: Internal Medicine

## 2015-11-15 DIAGNOSIS — I82492 Acute embolism and thrombosis of other specified deep vein of left lower extremity: Secondary | ICD-10-CM

## 2015-11-15 DIAGNOSIS — I480 Paroxysmal atrial fibrillation: Secondary | ICD-10-CM

## 2015-11-15 DIAGNOSIS — Z0389 Encounter for observation for other suspected diseases and conditions ruled out: Secondary | ICD-10-CM

## 2015-11-15 DIAGNOSIS — R0789 Other chest pain: Secondary | ICD-10-CM

## 2015-11-15 DIAGNOSIS — I2699 Other pulmonary embolism without acute cor pulmonale: Secondary | ICD-10-CM

## 2015-11-15 DIAGNOSIS — IMO0001 Reserved for inherently not codable concepts without codable children: Secondary | ICD-10-CM

## 2015-11-16 ENCOUNTER — Encounter: Payer: Self-pay | Admitting: Infectious Disease

## 2015-11-22 ENCOUNTER — Telehealth: Payer: Self-pay | Admitting: Internal Medicine

## 2015-11-22 ENCOUNTER — Telehealth: Payer: Self-pay | Admitting: Pharmacist

## 2015-11-22 NOTE — Telephone Encounter (Signed)
Patient called this afternoon and said he's breaking out and itching all over.  Patient said he had contacted other doctors and was awaiting a call back from them.  I sent patient to team health.  Patient called back and said he was put on hold by team health for 10 minutes.  I let patient know I could get him in for an appointment this afternoon with Tor Netters .I let him know Regina's schedule was full. Patient said he only wanted to see Rollene Fare and told me to forget it he'd call his other doctors and hung up.

## 2015-11-22 NOTE — Telephone Encounter (Signed)
I called pt 3 times and I keep getting busy signal with no error msg---tried to call myself and it did ring, something maybe wrong with pt's phone will try again tomorrow

## 2015-11-22 NOTE — Telephone Encounter (Signed)
It's possible he is having a reaction to one of the meds he is being treated for H Pylori. He needs to be seen here.

## 2015-11-22 NOTE — Telephone Encounter (Signed)
Spoke with Jeani Hawking at [facility]. Pt complaining of diffuse body rash on vancomycin.  After discussing with Ria Comment, RN at [facility] seems like red man's syndrome.  Patient is on 1750 mg IV q12h. Instructed RN and Jeani Hawking at [facility] to add 30 mins to the infusion and premedicate with Benadryl and Zantac.  Instructed to have patient call me if rash continues or does not go away.

## 2015-11-22 NOTE — Telephone Encounter (Signed)
Sounds reasonable. He also needs to be brought in for a [facility] visit

## 2015-11-23 ENCOUNTER — Ambulatory Visit: Payer: Managed Care, Other (non HMO) | Admitting: Internal Medicine

## 2015-11-23 ENCOUNTER — Inpatient Hospital Stay (HOSPITAL_COMMUNITY)
Admission: RE | Admit: 2015-11-23 | Payer: Managed Care, Other (non HMO) | Source: Ambulatory Visit | Admitting: Nurse Practitioner

## 2015-11-23 NOTE — Telephone Encounter (Signed)
Attempted to call pt again and I am still getting a busy signal

## 2015-11-24 ENCOUNTER — Other Ambulatory Visit: Payer: Self-pay | Admitting: Pharmacist

## 2015-11-24 ENCOUNTER — Telehealth: Payer: Self-pay | Admitting: Pharmacist

## 2015-11-24 ENCOUNTER — Ambulatory Visit (INDEPENDENT_AMBULATORY_CARE_PROVIDER_SITE_OTHER): Payer: Managed Care, Other (non HMO) | Admitting: Primary Care

## 2015-11-24 VITALS — BP 120/78 | HR 96 | Temp 99.3°F

## 2015-11-24 DIAGNOSIS — T8454XD Infection and inflammatory reaction due to internal left knee prosthesis, subsequent encounter: Secondary | ICD-10-CM

## 2015-11-24 DIAGNOSIS — L299 Pruritus, unspecified: Secondary | ICD-10-CM

## 2015-11-24 DIAGNOSIS — R21 Rash and other nonspecific skin eruption: Secondary | ICD-10-CM | POA: Diagnosis not present

## 2015-11-24 MED ORDER — HYDROXYZINE HCL 25 MG PO TABS: 25.0000 mg | ORAL_TABLET | Freq: Four times a day (QID) | ORAL | 0 refills | Status: DC | PRN

## 2015-11-24 MED ORDER — PREDNISONE 20 MG PO TABS
ORAL_TABLET | ORAL | 0 refills | Status: DC
Start: 2015-11-24 — End: 2015-11-28

## 2015-11-24 MED ORDER — LINEZOLID 600 MG PO TABS: 600.0000 mg | ORAL_TABLET | Freq: Two times a day (BID) | ORAL | 0 refills | Status: DC

## 2015-11-24 NOTE — Telephone Encounter (Signed)
Pt was seen in office today and he advised me that his number had been changed---number updated in chart

## 2015-11-24 NOTE — Patient Instructions (Signed)
Rash: Start Prednisone tablets. Take 2 tablets daily for four days.  Itching: Start hydroxyzine 25 mg tablets. Take 1 tablet by mouth every 6 hours as needed for itching.  Please ensure your phone number is correct. [facility] will be in touch with you later today.  It was a pleasure meeting you!

## 2015-11-24 NOTE — Telephone Encounter (Signed)
Anda Kraft, [place] from [facility], called me today.  She is seeing Mr. Jaymere this AM with worsening rash.  She states it is obvious red man's syndrome, but it has spread to all over his body and he is absolutely miserable.  Spoke with Dr. Baxter Flattery, will stop the patient's vanc and treat with Atarax and short course burst of prednisone. I will contact [place] this afternoon and discuss different treatment options.  Options are switching to IV daptomycin or oral linezolid to finish out his treatment course.  Will update patient once orders are figured out. Dr. Baxter Flattery agrees with plan.   Also asked Anda Kraft to get an updated phone number for him.  I have tried to call [name] all week and all I get is a busy signal.  From looking at his chart, other providers are getting the same busy signal.

## 2015-11-24 NOTE — Progress Notes (Signed)
Pre visit review using our clinic review tool, if applicable. No additional management support is needed unless otherwise documented below in the visit note. 

## 2015-11-24 NOTE — Progress Notes (Signed)
Subjective:    Patient ID: Richard [name], male    DOB: 11-Sep-1960, 55 y.o.   MRN: [id number]  HPI  Richard [name] is a 55 year old male who presents today with multiple complaints.  1) Sore Throat: Present for the past 3-4 days. He's noticed several bumps underneath his tongue which is now worse since he brushed under his tongue this morning. He also reports cough. He has a low grade fever today in the office, normal temperature at home.  2) Rash:  Located to his upper and  lower bilateral extremities, anterior and posterior trunk that he noticed three days ago. He's been under treatment for infection of left knee hardware post-op with Vancomycin for the last 3 weeks via a PICC line. It was determined that he had Red Man's Syndrome on Monday this week and is working with a pharmacist to reduce the dose of his treatment. He was also diagnosed with H. Pylori on 10/24 and placed on metronidazole, Pepto-Bismol, Nexium, tetracycline.   His main complaint is itching since the rash has developed. He's been taking Benadryl 3-4 times daily without much improvement. He denies throat closure, shortness of breath, wheezing. He has not been in contact with his home health care team within the last several days.  Review of Systems  Constitutional: Negative for chills, fatigue and fever.  HENT: Positive for sore throat. Negative for trouble swallowing.        Oral pain  Respiratory: Negative for shortness of breath, wheezing and stridor.   Cardiovascular: Negative for chest pain.  Skin: Positive for rash.       Pruritus        Past Medical History:  Diagnosis Date  . Arthritis   . Arthrofibrosis of total knee arthroplasty ([address]) 05/30/2015  . Bilateral pulmonary embolism ([place])    a. 03/2015 CTA: acute bilat PE.  Marland Kitchen Chest pain    a. 2015 Abnl MV;  b. 2015 Cath: nl cors.  . Chronic diastolic CHF (congestive heart failure) ([place])    a. 03/2015 Echo: EF 55-65%, poor windows, mildly dil RV.  Marland Kitchen DVT (deep  venous thrombosis) ([place])    a. 03/2015 U/S: occlusive DVT w/in the distal aspect of the Left fem vein through the L popliteal vein.  Marland Kitchen Dysrhythmia    PAF  . GERD (gastroesophageal reflux disease)   . Headache   . Hypertension   . Hypertensive heart disease   . Obesity   . OSA (obstructive sleep apnea)    uses CPAP nightly  . Primary localized osteoarthritis of left knee    a. 11/2014 s/p L TKA.  . Primary localized osteoarthritis of right knee    a. 02/2014 s/p R Partial Knee arthroplasty.  . Stiffness of left knee    a. 12/2014 s/p manipulation under anesthesia.  . Urine incontinence      Social History   Social History  . Marital status: Married    Spouse name: N/A  . Number of children: N/A  . 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   Social History Main Topics  . Smoking status: Never Smoker  . Smokeless tobacco: Never Used  . Alcohol use Yes     Comment: occassional  . Drug use: No  . Sexual activity: Yes   Other Topics Concern  . Not on file   Social History Narrative  . No narrative on file    Past Surgical History:  Procedure Laterality Date  .  APPENDECTOMY    . CARDIAC CATHETERIZATION    . EXAM UNDER ANESTHESIA WITH MANIPULATION OF KNEE Left 05/30/2015   Procedure: EXAM UNDER ANESTHESIA WITH MANIPULATION OF LEFT KNEE;  Surgeon: Marchia Bond, MD;  Location: [facility];  Service: Orthopedics;  Laterality: Left;  . I&D KNEE WITH POLY EXCHANGE Left 10/30/2015   Procedure: IRRIGATION AND DEBRIDEMENT KNEE WITH POLY EXCHANGE PLACE SPACERS;  Surgeon: Frederik Pear, MD;  Location: [facility];  Service: Orthopedics;  Laterality: Left;  OFF ELIQUIST 3 DAYS  . IVC FILTER PLACEMENT ([facility] HX)  04/13/15  . JOINT REPLACEMENT    . KNEE ARTHROSCOPY Left 08/08/2015   Procedure: LEFT KNEE MANIPULATION WITH ARTHROSCOPIC LYSIS OF ADHESIONS AND ASPIRATION;  Surgeon: Marchia Bond, MD;  Location: [facility];  Service: Orthopedics;  Laterality: Left;  . KNEE CLOSED  REDUCTION Left 01/20/2015   Procedure: LEFT KNEE MANIPULATION;  Surgeon: Marchia Bond, MD;  Location: [place];  Service: Orthopedics;  Laterality: Left;  . LEFT HEART CATHETERIZATION WITH CORONARY ANGIOGRAM N/A 01/12/2014   Procedure: LEFT HEART CATHETERIZATION WITH CORONARY ANGIOGRAM;  Surgeon: Jettie Booze, MD;  Location: [facility] CATH LAB;  Service: Cardiovascular;  Laterality: N/A;  . ORTHOPEDIC SURGERY Left    arthroscopy x3  . PARTIAL KNEE ARTHROPLASTY Right 02/25/2014   Procedure: RIGHT KNEE ARTHROPLASTY CONDYLE AND PLATEAU MEDIAL COMPARTMENT ;  Surgeon: Johnny Bridge, MD;  Location: [place];  Service: Orthopedics;  Laterality: Right;  . PERIPHERAL VASCULAR CATHETERIZATION N/A 03/29/2015   Procedure: IVC Filter Insertion, and possible thrombectomy;  Surgeon: Katha Cabal, MD;  Location: [facility] CV LAB;  Service: Cardiovascular;  Laterality: N/A;  . TOTAL KNEE ARTHROPLASTY  12/06/2014   Procedure: TOTAL KNEE ARTHROPLASTY;  Surgeon: Marchia Bond, MD;  Location: [place] [facility];  Service: Orthopedics;;    Family History  Problem Relation Age of Onset  . Coronary artery disease Mother   . Hyperlipidemia Mother   . Hypertension Mother   . Arthritis Mother   . Coronary artery disease Father   . Heart disease Paternal Grandmother   . Alzheimer's disease Paternal Grandfather     Allergies  Allergen Reactions  . Penicillins Other (See Comments)    Received ancef 3gms with no obvious reaction. 08/08/15 Has patient had a PCN reaction causing immediate rash, facial/tongue/throat swelling, SOB or lightheadedness with hypotension: Unknown, childhood reaction Has patient had a PCN reaction causing severe rash involving mucus membranes or skin necrosis: No Has patient had a PCN reaction that required hospitalization No Has patient had a PCN reaction occurring within the last 10 years: No If all of the above answers are "NO", then may proceed with Ce     Current Outpatient Prescriptions on File Prior to Visit  Medication Sig Dispense Refill  . apixaban (ELIQUIS) 5 MG TABS tablet Take 1 tablet (5 mg total) by mouth 2 (two) times daily. 180 tablet 3  . bismuth subsalicylate (PEPTO-BISMOL) 262 MG chewable tablet Chew 2 tablets (524 mg total) by mouth 4 (four) times daily -  before meals and at bedtime. 112 tablet 0  . esomeprazole (NEXIUM) 40 MG capsule Take 40 mg by mouth daily as needed (for acid reflux).     . flecainide (TAMBOCOR) 50 MG tablet Take 1 tablet (50 mg total) by mouth 2 (two) times daily. 180 tablet 3  . metoprolol succinate (TOPROL-XL) 25 MG 24 hr tablet Take 1 tablet (25 mg total) by mouth 2 (two) times daily. Take with or immediately following a  meal. 90 tablet 3  . metroNIDAZOLE (FLAGYL) 250 MG tablet Take 1 tablet (250 mg total) by mouth 4 (four) times daily. 56 tablet 0  . oxyCODONE-acetaminophen (ROXICET) 5-325 MG tablet Take 1 tablet by mouth every 4 (four) hours as needed. 60 tablet 0  . pantoprazole (PROTONIX) 40 MG tablet Take 1 tablet (40 mg total) by mouth 2 (two) times daily. 28 tablet 0  . tetracycline (ACHROMYCIN,SUMYCIN) 500 MG capsule Take 1 capsule (500 mg total) by mouth 4 (four) times daily. 56 capsule 0  . traMADol (ULTRAM) 50 MG tablet Take 50 mg by mouth every 6 (six) hours as needed for moderate pain.    . baclofen (LIORESAL) 10 MG tablet Take 1 tablet (10 mg total) by mouth 3 (three) times daily as needed for muscle spasms. (Patient not taking: Reported on 11/24/2015) 30 each 0   No current facility-administered medications on file prior to visit.     BP 120/78   Pulse 96   Temp 99.3 F (37.4 C) (Oral)   SpO2 96%    Objective:   Physical Exam  Constitutional: He appears well-nourished.  HENT:  Oral sores under tongue  Neck: Neck supple.  Cardiovascular: Normal rate and regular rhythm.   Pulmonary/Chest: Effort normal and breath sounds normal. He has no wheezes.  Skin: Skin is warm and dry.   Full body rash representative of red mans syndrome (papular, erythema).          Assessment & Plan:  Redman's Syndrome:  Full body rash x 3 days. Representative of Redman's syndrome.  Currently on treatment with Vancomycin via PICC line for infected left knee hardware. No s/s of anaphylaxis, lungs clear, no stridor. Vitals overall stable.  Spoke with patient's pharmacist and notified of reaction. Patient will stop Vancomycin. Plan to start Daptomycin, pharmacist will be in touch with [facility]. Rx for Prednisone burst and atarax sent to pharmacy for symptomatic management.  Discussed for patient to update his phone number today as numerous providers have been attempting to contact him. He verbalized understanding. Strict emergency department precautions provided.   Sheral Flow, NP

## 2015-11-24 NOTE — Telephone Encounter (Signed)
Patient's insurance does not cover daptomycin.  Will send in linezolid 600 mg PO BID for 15 day supply (until documented end date).  Also made appt with Dr. Megan Salon (first available) before abx stop. Patient aware.  Also called [facility] and gave verbal order to pull PICC.

## 2015-11-25 ENCOUNTER — Telehealth: Payer: Self-pay | Admitting: Internal Medicine

## 2015-11-25 NOTE — Telephone Encounter (Signed)
Richard [name] called stating that his pharmacy stated that he needs pre approval for his linezolid. It will cost him $4000 OOP which he can not afford. I will ask our team at clinic to trouble shoot as well as get copay card on Monday. Unable to reach insurance company over the weekend. Patient is agreable to the plan

## 2015-11-25 NOTE — Telephone Encounter (Signed)
We will need to get him linezolid co pay card and insurance may need pre approval per patient

## 2015-11-27 ENCOUNTER — Telehealth: Payer: Self-pay

## 2015-11-27 ENCOUNTER — Encounter: Payer: Self-pay | Admitting: Pharmacist

## 2015-11-27 ENCOUNTER — Other Ambulatory Visit: Payer: Self-pay | Admitting: Primary Care

## 2015-11-27 DIAGNOSIS — R21 Rash and other nonspecific skin eruption: Secondary | ICD-10-CM

## 2015-11-27 NOTE — Telephone Encounter (Signed)
Cassie, we spoke on the phone last week. What are your thoughts regarding his rash and additional prednisone? I appreciate your input.

## 2015-11-27 NOTE — Telephone Encounter (Signed)
Pt left v/m; pt was seen 11/24/15 and given prednisone. Pt has finished prednisone and did not know if needed anything further. Rash is slightly better but pt thinks needs more med to get rid of rash. Rash is all over body. Med given is helping itching. Pt request more prednisone to get rid of rash. [facility].

## 2015-11-28 ENCOUNTER — Telehealth: Payer: Self-pay | Admitting: Pharmacist

## 2015-11-28 MED ORDER — PREDNISONE 20 MG PO TABS: ORAL_TABLET | ORAL | 0 refills | Status: DC

## 2015-11-28 NOTE — Telephone Encounter (Signed)
Chan,Please notify patient that we have sent in a new prescription for prednisone to his pharmacy. Take 1 tablet by mouth daily for 5 days. Also, then rash may linger up to 2 weeks.   Thank you, Cassie!

## 2015-11-28 NOTE — Telephone Encounter (Signed)
Spoken and notified patient of Kate's comments. Patient verbalized understanding. 

## 2015-11-28 NOTE — Telephone Encounter (Signed)
Katie, I would give him 5 more days of 20 mg. Also, tell him the rash will probably hang around for ~2 weeks.  It won't go away immediately, so not to worry too much. Thanks!

## 2015-11-28 NOTE — Telephone Encounter (Signed)
Addendum: Just received fax from [facility], his linezolid was approved.  [facility] and patient does not have copay.  They will contact him.

## 2015-11-28 NOTE — Telephone Encounter (Signed)
Mr. Brandin just called me regarding his antibiotics. He stopped vancomycin last week due to red man's syndrome.  His insurance would not let him have daptomycin, so we have been working on linezolid for him.  His insurance required a prior auth, which I have been working on since yesterday morning.  He called, angry, demanding to get antibiotics. I explained to him that we are working on getting him linezolid, but I am waiting on his insurance.  Also explained to him that we have other options we could look into, such as getting the medication from our outpatient pharmacy.  He started yelling, telling me he was "getting the run around" from everyone and hung up on me.

## 2015-12-06 ENCOUNTER — Ambulatory Visit (INDEPENDENT_AMBULATORY_CARE_PROVIDER_SITE_OTHER): Payer: Managed Care, Other (non HMO) | Admitting: Internal Medicine

## 2015-12-06 ENCOUNTER — Encounter: Payer: Self-pay | Admitting: Internal Medicine

## 2015-12-06 VITALS — BP 123/82 | HR 98 | Temp 98.4°F | Ht 69.0 in | Wt 264.0 lb

## 2015-12-06 DIAGNOSIS — Z96652 Presence of left artificial knee joint: Secondary | ICD-10-CM

## 2015-12-06 DIAGNOSIS — A048 Other specified bacterial intestinal infections: Secondary | ICD-10-CM | POA: Diagnosis not present

## 2015-12-06 DIAGNOSIS — T7840XA Allergy, unspecified, initial encounter: Secondary | ICD-10-CM

## 2015-12-06 DIAGNOSIS — Z23 Encounter for immunization: Secondary | ICD-10-CM

## 2015-12-06 DIAGNOSIS — R21 Rash and other nonspecific skin eruption: Secondary | ICD-10-CM

## 2015-12-06 DIAGNOSIS — T8454XD Infection and inflammatory reaction due to internal left knee prosthesis, subsequent encounter: Secondary | ICD-10-CM

## 2015-12-06 LAB — BASIC METABOLIC PANEL
BUN: 23 mg/dL (ref 7–25)
CO2: 26 mmol/L (ref 20–31)
Calcium: 9.5 mg/dL (ref 8.6–10.3)
Chloride: 104 mmol/L (ref 98–110)
Creat: 1.27 mg/dL (ref 0.70–1.33)
Glucose, Bld: 82 mg/dL (ref 65–99)
Potassium: 4.9 mmol/L (ref 3.5–5.3)
Sodium: 139 mmol/L (ref 135–146)

## 2015-12-06 LAB — CBC
HCT: 38.8 % (ref 38.5–50.0)
Hemoglobin: 12.2 g/dL — ABNORMAL LOW (ref 13.2–17.1)
MCH: 24.5 pg — ABNORMAL LOW (ref 27.0–33.0)
MCHC: 31.4 g/dL — ABNORMAL LOW (ref 32.0–36.0)
MCV: 78.1 fL — ABNORMAL LOW (ref 80.0–100.0)
MPV: 9.7 fL (ref 7.5–12.5)
Platelets: 245 10*3/uL (ref 140–400)
RBC: 4.97 MIL/uL (ref 4.20–5.80)
RDW: 15.8 % — ABNORMAL HIGH (ref 11.0–15.0)
WBC: 10.1 10*3/uL (ref 3.8–10.8)

## 2015-12-06 MED ORDER — LINEZOLID 600 MG PO TABS: 600.0000 mg | ORAL_TABLET | Freq: Two times a day (BID) | ORAL | 0 refills | Status: DC

## 2015-12-06 NOTE — Assessment & Plan Note (Signed)
He is clinically improved after recent treatment for Helicobacter infection.

## 2015-12-06 NOTE — Assessment & Plan Note (Signed)
He seems to be responding well to resection arthroplasty and antibiotic therapy for his methicillin-resistant coagulase-negative staph left knee infection. I will continue oral linezolid for 3 more weeks to give him a total of 6 weeks of postoperative therapy. He will follow-up when necessary 3 weeks.

## 2015-12-06 NOTE — Progress Notes (Signed)
[facility] for Infectious Disease  Patient Active Problem List   Diagnosis Date Noted  . Coagulase-negative staphylococcal infection     Priority: High  . 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 Helicobacter pylori (H. pylori) infection 12/06/2015  . Rash due to allergy 12/06/2015  . Unsteadiness on feet   . Infection of total left knee replacement ([place]) 10/30/2015  . Arthrofibrosis of total knee arthroplasty ([place]) 05/30/2015  . PAF (paroxysmal atrial fibrillation) ([place]) 03/30/2015  . Peroneal DVT (deep venous thrombosis) ([place])   . Pulmonary embolism ([place]) 03/27/2015  . Hypertension 08/17/2011  . LVH (left ventricular hypertrophy) 08/17/2011  . GERD (gastroesophageal reflux disease) 08/17/2011  . Morbid obesity ([place]) 08/16/2011  . OSA (obstructive sleep apnea) 08/16/2011    Patient's Medications  New Prescriptions   No medications on file  Previous Medications   APIXABAN (ELIQUIS) 5 MG TABS TABLET    Take 1 tablet (5 mg total) by mouth 2 (two) times daily.   BACLOFEN (LIORESAL) 10 MG TABLET    Take 1 tablet (10 mg total) by mouth 3 (three) times daily as needed for muscle spasms.   BISMUTH SUBSALICYLATE (PEPTO-BISMOL) 262 MG CHEWABLE TABLET    Chew 2 tablets (524 mg total) by mouth 4 (four) times daily -  before meals and at bedtime.   ESOMEPRAZOLE (NEXIUM) 40 MG CAPSULE    Take 40 mg by mouth daily as needed (for acid reflux).    FLECAINIDE (TAMBOCOR) 150 MG TABLET    TAKE 1 TABLET BY MOUTH EVERY DAY AS NEEDED FOR PALPATATIONS   FLECAINIDE (TAMBOCOR) 50 MG TABLET    Take 1 tablet (50 mg total) by mouth 2 (two) times daily.   HYDROXYZINE (ATARAX/VISTARIL) 25 MG TABLET    Take 1 tablet (25 mg total) by mouth every 6 (six) hours as needed for itching.   METOPROLOL SUCCINATE (TOPROL-XL) 25 MG 24 HR TABLET    Take 1 tablet (25 mg total) by mouth 2 (two) times daily. Take with or immediately following a meal.   OXYCODONE-ACETAMINOPHEN (ROXICET) 5-325 MG TABLET    Take 1 tablet by mouth every 4 (four) hours as needed.   PANTOPRAZOLE (PROTONIX) 40 MG TABLET    Take 1 tablet (40 mg total) by mouth 2 (two) times daily.   PREDNISONE (DELTASONE) 20 MG TABLET    Take 1 tablet by mouth daily for 5 days for rash.   TRAMADOL (ULTRAM) 50 MG TABLET    Take 50 mg by mouth every 6 (six) hours as needed for moderate pain.  Modified Medications   Modified Medication Previous Medication   LINEZOLID (ZYVOX) 600 MG TABLET linezolid (ZYVOX) 600 MG tablet      Take 1 tablet (600 mg total) by mouth 2 (two) times daily.    Take 1 tablet (600 mg total) by mouth 2 (two) times daily.  Discontinued Medications   METRONIDAZOLE (FLAGYL) 250 MG TABLET    Take 1 tablet (250 mg total) by mouth 4 (four) times daily.   TETRACYCLINE (ACHROMYCIN,SUMYCIN) 500 MG CAPSULE    Take 1 capsule (500 mg total) by mouth 4 (four) times daily.    Subjective: [name] is in for his hospital follow-up visit. He underwent left total knee replacement on 12/06/2014. He had problems with postoperative pain swelling and decreased range of motion requiring multiple further procedures. He underwent debridement and lysis of adhesions on 08/08/2015. He had  worsening pain and swelling and was found to have methicillin-resistant coagulase-negative staph infection. He was hospitalized and underwent resection arthroplasty with spacer placement on 10/30/2015. He was discharged on IV vancomycin but developed a diffuse, red, pruritic rash. He was changed to oral linezolid but could not get it approved and was off medication for 2 weeks. He started the linezolid about one week ago. All told, he has had about 3 weeks of postoperative antibiotic therapy. His rash is resolving and his left knee pain is markedly improved. He had been bothered by dyspepsia and was found to have antibody to Helicobacter pylori. He recently completed a 4 drug regimen and is feeling  better.  Review of Systems: Review of Systems  Constitutional: Negative for chills, diaphoresis, fever, malaise/fatigue and weight loss.  HENT: Negative for sore throat.   Respiratory: Negative for cough, sputum production and shortness of breath.   Cardiovascular: Negative for chest pain.  Gastrointestinal: Negative for abdominal pain, diarrhea, heartburn, nausea and vomiting.  Genitourinary: Negative for dysuria and frequency.  Musculoskeletal: Positive for joint pain. Negative for myalgias.  Skin: Positive for itching and rash.  Neurological: Negative for dizziness and headaches.  Psychiatric/Behavioral: Negative for depression.    Past Medical History:  Diagnosis Date  . Arthritis   . Arthrofibrosis of total knee arthroplasty ([place]) 05/30/2015  . Bilateral pulmonary embolism ([place])    a. 03/2015 CTA: acute bilat PE.  Marland Kitchen Chest pain    a. 2015 Abnl MV;  b. 2015 Cath: nl cors.  . Chronic diastolic CHF (congestive heart failure) ([place])    a. 03/2015 Echo: EF 55-65%, poor windows, mildly dil RV.  Marland Kitchen DVT (deep venous thrombosis) ([place])    a. 03/2015 U/S: occlusive DVT w/in the distal aspect of the Left fem vein through the L popliteal vein.  Marland Kitchen Dysrhythmia    PAF  . GERD (gastroesophageal reflux disease)   . Headache   . Hypertension   . Hypertensive heart disease   . Obesity   . OSA (obstructive sleep apnea)    uses CPAP nightly  . Primary localized osteoarthritis of left knee    a. 11/2014 s/p L TKA.  . Primary localized osteoarthritis of right knee    a. 02/2014 s/p R Partial Knee arthroplasty.  . Stiffness of left knee    a. 12/2014 s/p manipulation under anesthesia.  . Urine incontinence     Social History  Substance Use Topics  . Smoking status: Never Smoker  . Smokeless tobacco: Never Used  . Alcohol use Yes     Comment: occassional    Family History  Problem Relation Age of Onset  . Coronary artery disease Mother   . Hyperlipidemia Mother   . Hypertension Mother    . Arthritis Mother   . Coronary artery disease Father   . Heart disease Paternal Grandmother   . Alzheimer's disease Paternal Grandfather     Allergies  Allergen Reactions  . Vancomycin Other (See Comments)    Red Mans' syndrome  . Penicillins Other (See Comments)    Received ancef 3gms with no obvious reaction. 08/08/15 Has patient had a PCN reaction causing immediate rash, facial/tongue/throat swelling, SOB or lightheadedness with hypotension: Unknown, childhood reaction Has patient had a PCN reaction causing severe rash involving mucus membranes or skin necrosis: No Has patient had a PCN reaction that required hospitalization No Has patient had a PCN reaction occurring within the last 10 years: No If all of the above answers are "NO", then may proceed  with Ce    Objective: Vitals:   12/06/15 1431  BP: 123/82  Pulse: 98  Temp: 98.4 F (36.9 C)  TempSrc: Oral  Weight: 264 lb (119.7 kg)  Height: 5\' 9"  (1.753 m)   Body mass index is 38.99 kg/m.  Physical Exam  Constitutional: He is oriented to person, place, and time.  He is in good spirits. He is seated in a wheelchair.  HENT:  Mouth/Throat: No oropharyngeal exudate.  Eyes: Conjunctivae are normal.  Cardiovascular: Normal rate and regular rhythm.   No murmur heard. Pulmonary/Chest: Effort normal and breath sounds normal.  Abdominal: Soft. There is no tenderness.  Musculoskeletal:  His left knee incision has healed nicely. He has diffuse swelling but no significant pain, redness or warmth.  Neurological: He is alert and oriented to person, place, and time.  Skin: Rash noted.  He still has some patchy erythema on his upper arms. He has some areas of desquamation.  Psychiatric: Mood and affect normal.    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 I will continue oral linezolid for 3 more weeks to give him a total of 6 weeks of postoperative therapy. He will follow-up when necessary 3 weeks.      Relevant Medications   linezolid (ZYVOX) 600 MG tablet (Start on 12/13/2015)   Other Relevant Orders   C-reactive protein   Sedimentation rate   CBC   Basic metabolic panel     Unprioritized   Helicobacter pylori (H. pylori) infection    He is clinically improved after recent treatment for Helicobacter infection.      Relevant Medications   linezolid (ZYVOX) 600 MG tablet (Start on 12/13/2015)   Infection of total left knee replacement ([place])   Relevant Medications   linezolid (ZYVOX) 600 MG tablet (Start on 12/13/2015)   Rash due to allergy    His allergic rash caused by vancomycin is resolving slowly.          Michel Bickers, MD [facility] [phone] pager   [phone] cell 12/06/2015, 2:59 PM

## 2015-12-06 NOTE — Addendum Note (Signed)
Addended by: Lorne Skeens D on: 12/06/2015 03:34 PM   Modules accepted: Orders

## 2015-12-06 NOTE — Assessment & Plan Note (Signed)
His allergic rash caused by vancomycin is resolving slowly.

## 2015-12-07 LAB — SEDIMENTATION RATE: Sed Rate: 10 mm/hr (ref 0–20)

## 2015-12-07 LAB — C-REACTIVE PROTEIN: CRP: 6.8 mg/L (ref <8.0)

## 2015-12-11 ENCOUNTER — Telehealth: Payer: Self-pay | Admitting: Internal Medicine

## 2015-12-11 ENCOUNTER — Other Ambulatory Visit: Payer: Self-pay | Admitting: Primary Care

## 2015-12-11 DIAGNOSIS — R21 Rash and other nonspecific skin eruption: Secondary | ICD-10-CM

## 2015-12-11 DIAGNOSIS — L299 Pruritus, unspecified: Secondary | ICD-10-CM

## 2015-12-11 NOTE — Telephone Encounter (Signed)
Pt called in to request a refill of his hdroxyzine 25mg  tablet to [facility].  Could you please call him about this refill request.

## 2015-12-11 NOTE — Telephone Encounter (Signed)
Last filled 11/24/15 at [facility] with Kate--please advise

## 2015-12-11 NOTE — Telephone Encounter (Signed)
Ok to refill 

## 2015-12-12 ENCOUNTER — Telehealth: Payer: Self-pay | Admitting: *Deleted

## 2015-12-12 MED ORDER — HYDROXYZINE HCL 25 MG PO TABS: 25.0000 mg | ORAL_TABLET | Freq: Four times a day (QID) | ORAL | 0 refills | Status: DC | PRN

## 2015-12-12 NOTE — Addendum Note (Signed)
Addended by: Lurlean Nanny on: 12/12/2015 08:56 AM   Modules accepted: Orders

## 2015-12-12 NOTE — Telephone Encounter (Signed)
Rx sent through [facility]  

## 2015-12-12 NOTE — Telephone Encounter (Signed)
Patient called and wanted to know his lab results. Advised him they are back to normal and he then asked if he should continue to take the oral antibiotics if so he needs a refill. Advised him will ask Dr Megan Salon and call him back.

## 2015-12-13 ENCOUNTER — Encounter: Payer: Self-pay | Admitting: Infectious Disease

## 2015-12-13 NOTE — Telephone Encounter (Signed)
I would like him to stay on linezolid for 2 more weeks. I sent a refill request in when he came for his visit on 12/06/2015.

## 2015-12-19 ENCOUNTER — Ambulatory Visit: Payer: Managed Care, Other (non HMO) | Admitting: Cardiovascular Disease

## 2015-12-19 NOTE — Progress Notes (Unsigned)
Cardiology Office Note   Date:  12/19/2015   ID:  Richard [name], DOB 01/31/1960, MRN [id number]  PCP:  Webb Silversmith, NP  Cardiologist:   Kathlyn Sacramento, MD   No chief complaint on file.     History of Present Illness: Richard [name] is a 55 y.o. male who presents for a follow-up visit regarding paroxysmal atrial fibrillation. The patient has history of normal cardiac catheterization in 2015 after an abnormal stress test, hypertension, obesity, osteoarthritis status post bilateral knee surgeries. The patient underwent right partial knee arthroplasty in February 2016 followed by left TKA in November 2016. His mobility was limited after that and he had the flu in February. In early March, he presented with bilateral pulmonary embolism. Lower extremity ultrasound showed left lower extremity DVT. Echo showed normal LV systolic function with mildly dilated right ventricle. He was treated with short-term amiodarone for his atrial fibrillation and underwent catheter-based intervention for his pulmonary embolism as well as IVC filter placement.  He was hospitalized in October for operative treatment of infected left knee prosthesis. He returned a week later with palpitations and chest pain with a heart rate of 180 bpm. CTA was negative for pulmonary embolism. He was discharged on flecainide to be used as needed for suspected atrial fibrillation. He continued to have intermittent palpitations and was seen by Christell Faith who changed flecainide to 50 mg twice daily.  Past Medical History:  Diagnosis Date  . Arthritis   . Arthrofibrosis of total knee arthroplasty ([place]) 05/30/2015  . Bilateral pulmonary embolism ([place])    a. 03/2015 CTA: acute bilat PE.  Marland Kitchen Chest pain    a. 2015 Abnl MV;  b. 2015 Cath: nl cors.  . Chronic diastolic CHF (congestive heart failure) ([place])    a. 03/2015 Echo: EF 55-65%, poor windows, mildly dil RV.  Marland Kitchen DVT (deep venous thrombosis) ([place])    a. 03/2015 U/S: occlusive DVT  w/in the distal aspect of the Left fem vein through the L popliteal vein.  Marland Kitchen Dysrhythmia    PAF  . GERD (gastroesophageal reflux disease)   . Headache   . Hypertension   . Hypertensive heart disease   . Obesity   . OSA (obstructive sleep apnea)    uses CPAP nightly  . Primary localized osteoarthritis of left knee    a. 11/2014 s/p L TKA.  . Primary localized osteoarthritis of right knee    a. 02/2014 s/p R Partial Knee arthroplasty.  . Stiffness of left knee    a. 12/2014 s/p manipulation under anesthesia.  . Urine incontinence     Past Surgical History:  Procedure Laterality Date  . APPENDECTOMY    . CARDIAC CATHETERIZATION    . EXAM UNDER ANESTHESIA WITH MANIPULATION OF KNEE Left 05/30/2015   Procedure: EXAM UNDER ANESTHESIA WITH MANIPULATION OF LEFT KNEE;  Surgeon: Marchia Bond, MD;  Location: [facility];  Service: Orthopedics;  Laterality: Left;  . I&D KNEE WITH POLY EXCHANGE Left 10/30/2015   Procedure: IRRIGATION AND DEBRIDEMENT KNEE WITH POLY EXCHANGE PLACE SPACERS;  Surgeon: Frederik Pear, MD;  Location: [facility];  Service: Orthopedics;  Laterality: Left;  OFF ELIQUIST 3 DAYS  . IVC FILTER PLACEMENT ([facility] HX)  04/13/15  . JOINT REPLACEMENT    . KNEE ARTHROSCOPY Left 08/08/2015   Procedure: LEFT KNEE MANIPULATION WITH ARTHROSCOPIC LYSIS OF ADHESIONS AND ASPIRATION;  Surgeon: Marchia Bond, MD;  Location: [facility];  Service: Orthopedics;  Laterality: Left;  . KNEE CLOSED REDUCTION Left 01/20/2015  Procedure: LEFT KNEE MANIPULATION;  Surgeon: Marchia Bond, MD;  Location: [facility];  Service: Orthopedics;  Laterality: Left;  . LEFT HEART CATHETERIZATION WITH CORONARY ANGIOGRAM N/A 01/12/2014   Procedure: LEFT HEART CATHETERIZATION WITH CORONARY ANGIOGRAM;  Surgeon: Jettie Booze, MD;  Location: [facility] CATH LAB;  Service: Cardiovascular;  Laterality: N/A;  . ORTHOPEDIC SURGERY Left    arthroscopy x3  . PARTIAL KNEE ARTHROPLASTY Right 02/25/2014   Procedure: RIGHT KNEE  ARTHROPLASTY CONDYLE AND PLATEAU MEDIAL COMPARTMENT ;  Surgeon: Johnny Bridge, MD;  Location: [facility];  Service: Orthopedics;  Laterality: Right;  . PERIPHERAL VASCULAR CATHETERIZATION N/A 03/29/2015   Procedure: IVC Filter Insertion, and possible thrombectomy;  Surgeon: Katha Cabal, MD;  Location: [facility] CV LAB;  Service: Cardiovascular;  Laterality: N/A;  . TOTAL KNEE ARTHROPLASTY  12/06/2014   Procedure: TOTAL KNEE ARTHROPLASTY;  Surgeon: Marchia Bond, MD;  Location: [facility] OR;  Service: Orthopedics;;     Current Outpatient Prescriptions  Medication Sig Dispense Refill  . apixaban (ELIQUIS) 5 MG TABS tablet Take 1 tablet (5 mg total) by mouth 2 (two) times daily. 180 tablet 3  . baclofen (LIORESAL) 10 MG tablet Take 1 tablet (10 mg total) by mouth 3 (three) times daily as needed for muscle spasms. (Patient not taking: Reported on 11/24/2015) 30 each 0  . bismuth subsalicylate (PEPTO-BISMOL) 262 MG chewable tablet Chew 2 tablets (524 mg total) by mouth 4 (four) times daily -  before meals and at bedtime. 112 tablet 0  . esomeprazole (NEXIUM) 40 MG capsule Take 40 mg by mouth daily as needed (for acid reflux).     . flecainide (TAMBOCOR) 150 MG tablet TAKE 1 TABLET BY MOUTH EVERY DAY AS NEEDED FOR PALPATATIONS  3  . flecainide (TAMBOCOR) 50 MG tablet Take 1 tablet (50 mg total) by mouth 2 (two) times daily. 180 tablet 3  . hydrOXYzine (ATARAX/VISTARIL) 25 MG tablet Take 1 tablet (25 mg total) by mouth every 6 (six) hours as needed for itching. 40 tablet 0  . linezolid (ZYVOX) 600 MG tablet Take 1 tablet (600 mg total) by mouth 2 (two) times daily. 30 tablet 0  . metoprolol succinate (TOPROL-XL) 25 MG 24 hr tablet Take 1 tablet (25 mg total) by mouth 2 (two) times daily. Take with or immediately following a meal. 90 tablet 3  . oxyCODONE-acetaminophen (ROXICET) 5-325 MG tablet Take 1 tablet by mouth every 4 (four) hours as needed. 60 tablet 0  . pantoprazole (PROTONIX)  40 MG tablet Take 1 tablet (40 mg total) by mouth 2 (two) times daily. 28 tablet 0  . predniSONE (DELTASONE) 20 MG tablet Take 1 tablet by mouth daily for 5 days for rash. 5 tablet 0  . traMADol (ULTRAM) 50 MG tablet Take 50 mg by mouth every 6 (six) hours as needed for moderate pain.     No current facility-administered medications for this visit.     Allergies:   Vancomycin and Penicillins    Social History:  The patient  reports that he has never smoked. He has never used smokeless tobacco. He reports that he drinks alcohol. He reports that he does not use drugs.   Family History:  The patient's family history includes Alzheimer's disease in his paternal grandfather; Arthritis in his mother; Coronary artery disease in his father and mother; Heart disease in his paternal grandmother; Hyperlipidemia in his mother; Hypertension in his mother.    ROS:  Please see the history of  present illness.   Otherwise, review of systems are positive for none.   All other systems are reviewed and negative.    PHYSICAL EXAM: VS:  There were no vitals taken for this visit. , BMI There is no height or weight on file to calculate BMI. GEN: Well nourished, well developed, in no acute distress  HEENT: normal  Neck: no JVD, carotid bruits, or masses Cardiac: RRR; no murmurs, rubs, or gallops,no edema  Respiratory:  clear to auscultation bilaterally, normal work of breathing GI: soft, nontender, nondistended, + BS MS: no deformity or atrophy  Skin: warm and dry, no rash Neuro:  Strength and sensation are intact Psych: euthymic mood, full affect   EKG:  EKG is ordered today. The ekg ordered today demonstrates normal sinus rhythm with left anterior fascicular block.   Recent Labs: 03/30/2015: TSH 1.741 06/22/2015: ALT 16 11/11/2015: Magnesium 1.8 12/06/2015: BUN 23; Creat 1.27; Hemoglobin 12.2; Platelets 245; Potassium 4.9; Sodium 139    Lipid Panel    Component Value Date/Time   CHOL 124  03/29/2015 0434   TRIG 71 03/29/2015 0434   HDL 30 (L) 03/29/2015 0434   CHOLHDL 4.1 03/29/2015 0434   VLDL 14 03/29/2015 0434   LDLCALC 80 03/29/2015 0434      Wt Readings from Last 3 Encounters:  12/06/15 264 lb (119.7 kg)  11/13/15 266 lb (120.7 kg)  11/11/15 266 lb (120.7 kg)       ASSESSMENT AND PLAN:  1.  Paroxysmal atrial fibrillation: This was in the setting of massive pulmonary embolism.   No evidence of arrhythmia after stopping amiodarone. He is currently on metoprolol.  2. Pulmonary embolism: Due to left lower extremity DVT after left knee replacement. Given the burden of his pulmonary embolism and the fact that his mobility is still very limited, recommend continuing anticoagulation. Most likely, he will require lifelong anticoagulation. If his mobility improves to normal, hypercoagulable workup can be considered to guide the length of anticoagulation.  3. Left leg swelling: Likely related to DVT and previous left knee replacement. I advised him to use furosemide only as needed and not on a regular basis.    Disposition:   FU with me in 6 months  Signed,  Kathlyn Sacramento, MD  12/19/2015 2:33 PM    [place]

## 2015-12-27 ENCOUNTER — Encounter: Payer: Self-pay | Admitting: Internal Medicine

## 2015-12-27 ENCOUNTER — Ambulatory Visit (INDEPENDENT_AMBULATORY_CARE_PROVIDER_SITE_OTHER): Payer: Managed Care, Other (non HMO) | Admitting: Internal Medicine

## 2015-12-27 DIAGNOSIS — T8454XD Infection and inflammatory reaction due to internal left knee prosthesis, subsequent encounter: Secondary | ICD-10-CM

## 2015-12-27 NOTE — Progress Notes (Signed)
[facility] for Infectious Disease  Patient Active Problem List   Diagnosis Date Noted  . Coagulase-negative staphylococcal infection     Priority: High  . 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 Helicobacter pylori (H. pylori) infection 12/06/2015  . Rash due to allergy 12/06/2015  . Unsteadiness on feet   . Infection of total left knee replacement ([place]) 10/30/2015  . Arthrofibrosis of total knee arthroplasty ([place]) 05/30/2015  . PAF (paroxysmal atrial fibrillation) ([place]) 03/30/2015  . Peroneal DVT (deep venous thrombosis) ([place])   . Pulmonary embolism ([place]) 03/27/2015  . Hypertension 08/17/2011  . LVH (left ventricular hypertrophy) 08/17/2011  . GERD (gastroesophageal reflux disease) 08/17/2011  . Morbid obesity ([place]) 08/16/2011  . OSA (obstructive sleep apnea) 08/16/2011    Patient's Medications  New Prescriptions   No medications on file  Previous Medications   APIXABAN (ELIQUIS) 5 MG TABS TABLET    Take 1 tablet (5 mg total) by mouth 2 (two) times daily.   BACLOFEN (LIORESAL) 10 MG TABLET    Take 1 tablet (10 mg total) by mouth 3 (three) times daily as needed for muscle spasms.   BISMUTH SUBSALICYLATE (PEPTO-BISMOL) 262 MG CHEWABLE TABLET    Chew 2 tablets (524 mg total) by mouth 4 (four) times daily -  before meals and at bedtime.   ESOMEPRAZOLE (NEXIUM) 40 MG CAPSULE    Take 40 mg by mouth daily as needed (for acid reflux).    FLECAINIDE (TAMBOCOR) 150 MG TABLET    TAKE 1 TABLET BY MOUTH EVERY DAY AS NEEDED FOR PALPATATIONS   FLECAINIDE (TAMBOCOR) 50 MG TABLET    Take 1 tablet (50 mg total) by mouth 2 (two) times daily.   HYDROXYZINE (ATARAX/VISTARIL) 25 MG TABLET    Take 1 tablet (25 mg total) by mouth every 6 (six) hours as needed for itching.   LINEZOLID (ZYVOX) 600 MG TABLET    Take 1 tablet (600 mg total) by mouth 2 (two) times daily.   METOPROLOL SUCCINATE (TOPROL-XL) 25 MG 24 HR TABLET    Take 1  tablet (25 mg total) by mouth 2 (two) times daily. Take with or immediately following a meal.   OXYCODONE-ACETAMINOPHEN (ROXICET) 5-325 MG TABLET    Take 1 tablet by mouth every 4 (four) hours as needed.   TRAMADOL (ULTRAM) 50 MG TABLET    Take 50 mg by mouth every 6 (six) hours as needed for moderate pain.  Modified Medications   No medications on file  Discontinued Medications   PANTOPRAZOLE (PROTONIX) 40 MG TABLET    Take 1 tablet (40 mg total) by mouth 2 (two) times daily.   PREDNISONE (DELTASONE) 20 MG TABLET    Take 1 tablet by mouth daily for 5 days for rash.   Subjective: [name] is in for his follow-up visit. He underwent left total knee replacement on 12/06/2014. He had problems with postoperative pain swelling and decreased range of motion requiring multiple further procedures. He underwent debridement and lysis of adhesions on 08/08/2015. He had worsening pain and swelling and was found to have methicillin-resistant coagulase-negative staph infection. He was hospitalized and underwent resection arthroplasty with spacer placement on 10/30/2015. He was discharged on IV vancomycin but developed a diffuse, red, pruritic rash. He was changed to oral linezolid but could not get it approved and was off medication for 2 weeks. He started the linezolid and had about 3 weeks of  postoperative antibiotic therapy when he saw me on 12/06/2015. At the time of that visit I asked him to continue linezolid for 3 more weeks. I electronic prescription to [facility]. He now tells me that he just got that filled 2 days ago. Apparently he required preauthorization from his health plan. No one ever notified our office of the delay. He is feeling better.  Review of Systems: Review of Systems  Constitutional: Negative for chills, diaphoresis and fever.  Gastrointestinal: Negative for abdominal pain, diarrhea, nausea and vomiting.  Musculoskeletal: Positive for joint pain.    Past Medical History:  Diagnosis Date    . Arthritis   . Arthrofibrosis of total knee arthroplasty ([place]) 05/30/2015  . Bilateral pulmonary embolism ([place])    a. 03/2015 CTA: acute bilat PE.  Marland Kitchen Chest pain    a. 2015 Abnl MV;  b. 2015 Cath: nl cors.  . Chronic diastolic CHF (congestive heart failure) ([place])    a. 03/2015 Echo: EF 55-65%, poor windows, mildly dil RV.  Marland Kitchen DVT (deep venous thrombosis) ([place])    a. 03/2015 U/S: occlusive DVT w/in the distal aspect of the Left fem vein through the L popliteal vein.  Marland Kitchen Dysrhythmia    PAF  . GERD (gastroesophageal reflux disease)   . Headache   . Hypertension   . Hypertensive heart disease   . Obesity   . OSA (obstructive sleep apnea)    uses CPAP nightly  . Primary localized osteoarthritis of left knee    a. 11/2014 s/p L TKA.  . Primary localized osteoarthritis of right knee    a. 02/2014 s/p R Partial Knee arthroplasty.  . Stiffness of left knee    a. 12/2014 s/p manipulation under anesthesia.  . Urine incontinence     Social History  Substance Use Topics  . Smoking status: Never Smoker  . Smokeless tobacco: Never Used  . Alcohol use Yes     Comment: occassional    Family History  Problem Relation Age of Onset  . Coronary artery disease Mother   . Hyperlipidemia Mother   . Hypertension Mother   . Arthritis Mother   . Coronary artery disease Father   . Heart disease Paternal Grandmother   . Alzheimer's disease Paternal Grandfather     Allergies  Allergen Reactions  . Vancomycin Other (See Comments)    Red Mans' syndrome  . Penicillins Other (See Comments)    Received ancef 3gms with no obvious reaction. 08/08/15 Has patient had a PCN reaction causing immediate rash, facial/tongue/throat swelling, SOB or lightheadedness with hypotension: Unknown, childhood reaction Has patient had a PCN reaction causing severe rash involving mucus membranes or skin necrosis: No Has patient had a PCN reaction that required hospitalization No Has patient had a PCN reaction occurring  within the last 10 years: No If all of the above answers are "NO", then may proceed with Ce    Objective: Vitals:   12/27/15 1409  BP: 104/68  Pulse: 60  Temp: 97.7 F (36.5 C)  TempSrc: Oral  Weight: 273 lb (123.8 kg)  Height: 5\' 9"  (1.753 m)   Body mass index is 40.32 kg/m.  Physical Exam  Constitutional: He is oriented to person, place, and time.  He is in good spirits. He is seated in a wheelchair.  Musculoskeletal:  His left knee incision is completely healed. He has some moderate diffuse swelling but no erythema or unusual warmth.  Neurological: He is alert and oriented to person, place, and time.  Skin: No  rash noted.  Psychiatric: Mood and affect normal.    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 He shown good clinical response to resection arthroplasty in 3 weeks of antibiotics. His inflammatory markers had returned to normal 3 weeks ago. However, I will have him complete 3 more weeks of oral linezolid.      Relevant Orders   C-reactive protein   Sedimentation rate       Michel Bickers, MD [facility] for Infectious [place] [phone] pager   [phone] cell 12/27/2015, 3:03 PM

## 2015-12-27 NOTE — Assessment & Plan Note (Signed)
Unfortunately there has been interruption in his antibiotic therapy. He shown good clinical response to resection arthroplasty in 3 weeks of antibiotics. His inflammatory markers had returned to normal 3 weeks ago. However, I will have him complete 3 more weeks of oral linezolid.

## 2015-12-28 ENCOUNTER — Encounter: Payer: Self-pay | Admitting: Internal Medicine

## 2015-12-28 ENCOUNTER — Ambulatory Visit (INDEPENDENT_AMBULATORY_CARE_PROVIDER_SITE_OTHER): Payer: Managed Care, Other (non HMO) | Admitting: Internal Medicine

## 2015-12-28 VITALS — BP 120/72 | HR 63 | Temp 98.5°F | Wt 272.0 lb

## 2015-12-28 DIAGNOSIS — G44209 Tension-type headache, unspecified, not intractable: Secondary | ICD-10-CM | POA: Diagnosis not present

## 2015-12-28 LAB — SEDIMENTATION RATE: Sed Rate: 8 mm/hr (ref 0–20)

## 2015-12-28 LAB — C-REACTIVE PROTEIN: CRP: 8.3 mg/L — ABNORMAL HIGH (ref <8.0)

## 2015-12-28 MED ORDER — CYCLOBENZAPRINE HCL 10 MG PO TABS: 10.0000 mg | ORAL_TABLET | Freq: Three times a day (TID) | ORAL | 0 refills | Status: DC | PRN

## 2015-12-29 ENCOUNTER — Telehealth: Payer: Self-pay

## 2015-12-29 MED ORDER — BACLOFEN 10 MG PO TABS: 10.0000 mg | ORAL_TABLET | Freq: Three times a day (TID) | ORAL | 0 refills | Status: DC | PRN

## 2015-12-29 NOTE — Telephone Encounter (Signed)
The linezoled was for a 15 day prescription which he should be done with. Can you call him and see if he is still taking this.

## 2015-12-29 NOTE — Telephone Encounter (Signed)
Pt is still taking medication---verbal okay from [facility] for pt to take 1-2 tabs of baclofen in the meantime

## 2015-12-29 NOTE — Telephone Encounter (Signed)
Pt left v/m could not get cyclobenzaprine due to possible interaction with another med; spoke with Lovey Newcomer at [facility] and she said possible drug interaction between cyclobenzaprine and linezolid. Please advise. Pt request cb also.

## 2015-12-30 ENCOUNTER — Encounter: Payer: Self-pay | Admitting: Internal Medicine

## 2015-12-30 NOTE — Progress Notes (Signed)
Subjective:    Patient ID: Richard [name], male    DOB: 04/09/1960, 55 y.o.   MRN: [id number]  HPI  Pt presents to the clinic today with c/o a headache. This started 4 days ago. The pain is located in his the left side of his head. He describes the pain as throbbing. It is worse if he coughs. He denies dizziness, visual changes, sensitivity to light, nausea or vomiting. He does have some sensitivity to sound. He has tried Tylenol and Oxycodone with minimal relief. He has had migraines in the past and reports this feels the same.  Review of Systems      Past Medical History:  Diagnosis Date  . Arthritis   . Arthrofibrosis of total knee arthroplasty ([place]) 05/30/2015  . Bilateral pulmonary embolism ([place])    a. 03/2015 CTA: acute bilat PE.  Marland Kitchen Chest pain    a. 2015 Abnl MV;  b. 2015 Cath: nl cors.  . Chronic diastolic CHF (congestive heart failure) ([place])    a. 03/2015 Echo: EF 55-65%, poor windows, mildly dil RV.  Marland Kitchen DVT (deep venous thrombosis) ([place])    a. 03/2015 U/S: occlusive DVT w/in the distal aspect of the Left fem vein through the L popliteal vein.  Marland Kitchen Dysrhythmia    PAF  . GERD (gastroesophageal reflux disease)   . Headache   . Hypertension   . Hypertensive heart disease   . Obesity   . OSA (obstructive sleep apnea)    uses CPAP nightly  . Primary localized osteoarthritis of left knee    a. 11/2014 s/p L TKA.  . Primary localized osteoarthritis of right knee    a. 02/2014 s/p R Partial Knee arthroplasty.  . Stiffness of left knee    a. 12/2014 s/p manipulation under anesthesia.  . Urine incontinence     Current Outpatient Prescriptions  Medication Sig Dispense Refill  . apixaban (ELIQUIS) 5 MG TABS tablet Take 1 tablet (5 mg total) by mouth 2 (two) times daily. 180 tablet 3  . bismuth subsalicylate (PEPTO-BISMOL) 262 MG chewable tablet Chew 2 tablets (524 mg total) by mouth 4 (four) times daily -  before meals and at bedtime. 112 tablet 0  . esomeprazole (NEXIUM) 40 MG  capsule Take 40 mg by mouth daily as needed (for acid reflux).     . flecainide (TAMBOCOR) 50 MG tablet Take 1 tablet (50 mg total) by mouth 2 (two) times daily. 180 tablet 3  . hydrOXYzine (ATARAX/VISTARIL) 25 MG tablet Take 1 tablet (25 mg total) by mouth every 6 (six) hours as needed for itching. 40 tablet 0  . linezolid (ZYVOX) 600 MG tablet Take 1 tablet (600 mg total) by mouth 2 (two) times daily. 30 tablet 0  . metoprolol succinate (TOPROL-XL) 25 MG 24 hr tablet Take 1 tablet (25 mg total) by mouth 2 (two) times daily. Take with or immediately following a meal. 90 tablet 3  . oxyCODONE-acetaminophen (ROXICET) 5-325 MG tablet Take 1 tablet by mouth every 4 (four) hours as needed. 60 tablet 0  . traMADol (ULTRAM) 50 MG tablet Take 50 mg by mouth every 6 (six) hours as needed for moderate pain.    . baclofen (LIORESAL) 10 MG tablet Take 1-2 tablets (10-20 mg total) by mouth 3 (three) times daily as needed for muscle spasms. 180 each 0  . cyclobenzaprine (FLEXERIL) 10 MG tablet Take 1 tablet (10 mg total) by mouth 3 (three) times daily as needed for muscle spasms. 20 tablet 0  No current facility-administered medications for this visit.     Allergies  Allergen Reactions  . Vancomycin Other (See Comments)    Red Mans' syndrome  . Penicillins Other (See Comments)    Received ancef 3gms with no obvious reaction. 08/08/15 Has patient had a PCN reaction causing immediate rash, facial/tongue/throat swelling, SOB or lightheadedness with hypotension: Unknown, childhood reaction Has patient had a PCN reaction causing severe rash involving mucus membranes or skin necrosis: No Has patient had a PCN reaction that required hospitalization No Has patient had a PCN reaction occurring within the last 10 years: No If all of the above answers are "NO", then may proceed with Ce    Family History  Problem Relation Age of Onset  . Coronary artery disease Mother   . Hyperlipidemia Mother   . Hypertension  Mother   . Arthritis Mother   . Coronary artery disease Father   . Heart disease Paternal Grandmother   . Alzheimer's disease Paternal Grandfather     Social History   Social History  . Marital status: Married    Spouse name: N/A  . Number of children: N/A  . 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   Social History Main Topics  . Smoking status: Never Smoker  . Smokeless tobacco: Never Used  . Alcohol use Yes     Comment: occassional  . Drug use: No  . Sexual activity: Yes   Other Topics Concern  . Not on file   Social History Narrative  . No narrative on file     Constitutional: Pt reports headache. Denies fever, malaise, fatigue, or abrupt weight changes.  HEENT: Denies eye pain, eye redness, ear pain, ringing in the ears, wax buildup, runny nose, nasal congestion, bloody nose, or sore throat. Neurological: Denies dizziness, difficulty with memory, difficulty with speech or problems with balance and coordination.    No other specific complaints in a complete review of systems (except as listed in HPI above).  Objective:   Physical Exam   BP 120/72   Pulse 63   Temp 98.5 F (36.9 C) (Oral)   Wt 272 lb (123.4 kg)   SpO2 97%   BMI 40.17 kg/m  Wt Readings from Last 3 Encounters:  12/28/15 272 lb (123.4 kg)  12/27/15 273 lb (123.8 kg)  12/06/15 264 lb (119.7 kg)    General: Appears his stated age, obese in NAD. HEENT: Head: normal shape and size; Eyes: sclera white, no icterus, conjunctiva pink, PERRLA and EOMs intact;  Musculoskeletal: Normal flexion, extension, rotation and lateral bending of the cervical spine. No bony tenderness noted. Tension noted over the paracervical muscles bilaterally. Neurological: Alert and oriented. Coordination normal.    BMET    Component Value Date/Time   NA 139 12/06/2015 1600   K 4.9 12/06/2015 1600   CL 104 12/06/2015 1600   CO2 26 12/06/2015 1600   GLUCOSE 82 12/06/2015 1600   BUN 23  12/06/2015 1600   CREATININE 1.27 12/06/2015 1600   CALCIUM 9.5 12/06/2015 1600   GFRNONAA >60 11/11/2015 0334   GFRAA >60 11/11/2015 0334    Lipid Panel     Component Value Date/Time   CHOL 124 03/29/2015 0434   TRIG 71 03/29/2015 0434   HDL 30 (L) 03/29/2015 0434   CHOLHDL 4.1 03/29/2015 0434   VLDL 14 03/29/2015 0434   LDLCALC 80 03/29/2015 0434    CBC    Component Value Date/Time   WBC 10.1  12/06/2015 1600   RBC 4.97 12/06/2015 1600   HGB 12.2 (L) 12/06/2015 1600   HCT 38.8 12/06/2015 1600   PLT 245 12/06/2015 1600   MCV 78.1 (L) 12/06/2015 1600   MCH 24.5 (L) 12/06/2015 1600   MCHC 31.4 (L) 12/06/2015 1600   RDW 15.8 (H) 12/06/2015 1600   LYMPHSABS 2.2 10/30/2015 1348   MONOABS 0.9 10/30/2015 1348   EOSABS 0.2 10/30/2015 1348   BASOSABS 0.0 10/30/2015 1348    Hgb A1C Lab Results  Component Value Date   HGBA1C 5.5 06/22/2015           Assessment & Plan:  Tension headache:  He is unable to have a Depo Medrol shot or take NSAID's secondary to being on Eliquis Advised him to take Tylenol 1000 mg every 8 hours as needed Hold Baclofen for now eRx for Flexeril 10 mg TID prn Return precautions discussed  RTC as needed or if symptoms persist or worsen BAITY, REGINA, NP

## 2015-12-31 NOTE — Patient Instructions (Signed)

## 2016-01-01 NOTE — Telephone Encounter (Signed)
Patient called stating that he is still having a headache and the medication he was given has not helped. [facility]acy [facility]

## 2016-01-01 NOTE — Telephone Encounter (Signed)
Spoke to patient by telephone and was advised that he has not tried 2 Percocet at once. Patient stated that he will try that and call back if it does not help.

## 2016-01-01 NOTE — Telephone Encounter (Signed)
Patient notified as instructed by telephone and was advised that he has not tried 2 Percocet. Patient stated that he will try that and if no improvement will call back.

## 2016-01-01 NOTE — Telephone Encounter (Signed)
Has he tried taking 2 percocet at once?

## 2016-01-04 ENCOUNTER — Ambulatory Visit: Payer: Managed Care, Other (non HMO) | Admitting: Internal Medicine

## 2016-01-11 ENCOUNTER — Other Ambulatory Visit: Payer: Self-pay | Admitting: Orthopedic Surgery

## 2016-01-12 ENCOUNTER — Other Ambulatory Visit: Payer: Self-pay

## 2016-01-12 DIAGNOSIS — L299 Pruritus, unspecified: Secondary | ICD-10-CM

## 2016-01-12 DIAGNOSIS — R21 Rash and other nonspecific skin eruption: Secondary | ICD-10-CM

## 2016-01-12 MED ORDER — HYDROXYZINE HCL 25 MG PO TABS: 25.0000 mg | ORAL_TABLET | Freq: Four times a day (QID) | ORAL | 0 refills | Status: DC | PRN

## 2016-01-12 MED ORDER — TRAMADOL HCL 50 MG PO TABS: 50.0000 mg | ORAL_TABLET | Freq: Four times a day (QID) | ORAL | 0 refills | Status: DC | PRN

## 2016-01-12 NOTE — Telephone Encounter (Signed)
Ok to phone in Tramadol. Hydroxyzine sent electronically.

## 2016-01-12 NOTE — Telephone Encounter (Signed)
Pt left v/m requesting refill of itch pill and tramadol to [facility]. Hydroxyzine last refilled # 40 on 12/12/15 and tramadol last refilled # 30 on 10/23/15 on hx med list.pt last seen 12/28/15.Please advise.

## 2016-01-12 NOTE — Telephone Encounter (Signed)
Rx called in to pharmacy. 

## 2016-01-31 ENCOUNTER — Encounter (HOSPITAL_COMMUNITY): Payer: Self-pay

## 2016-01-31 ENCOUNTER — Ambulatory Visit (HOSPITAL_COMMUNITY): Admission: RE | Admit: 2016-01-31 | Payer: Managed Care, Other (non HMO) | Source: Ambulatory Visit

## 2016-01-31 ENCOUNTER — Encounter (HOSPITAL_COMMUNITY)
Admission: RE | Admit: 2016-01-31 | Discharge: 2016-01-31 | Disposition: A | Discharge status: Home / Self Care | Payer: Managed Care, Other (non HMO) | Source: Ambulatory Visit | Attending: Orthopedic Surgery | Admitting: Orthopedic Surgery

## 2016-01-31 ENCOUNTER — Telehealth: Payer: Self-pay | Admitting: Cardiovascular Disease

## 2016-01-31 DIAGNOSIS — Z01818 Encounter for other preprocedural examination: Secondary | ICD-10-CM

## 2016-01-31 DIAGNOSIS — Z01812 Encounter for preprocedural laboratory examination: Secondary | ICD-10-CM | POA: Diagnosis not present

## 2016-01-31 LAB — URINALYSIS, ROUTINE W REFLEX MICROSCOPIC
Bilirubin Urine: NEGATIVE
Glucose, UA: NEGATIVE mg/dL
Hgb urine dipstick: NEGATIVE
Ketones, ur: NEGATIVE mg/dL
Leukocytes, UA: NEGATIVE
Nitrite: NEGATIVE
Protein, ur: NEGATIVE mg/dL
Specific Gravity, Urine: 1.018 (ref 1.005–1.030)
pH: 5 (ref 5.0–8.0)

## 2016-01-31 LAB — TYPE AND SCREEN
ABO/RH(D): A POS
Antibody Screen: NEGATIVE

## 2016-01-31 LAB — CBC WITH DIFFERENTIAL/PLATELET
Basophils Absolute: 0.1 10*3/uL (ref 0.0–0.1)
Basophils Relative: 1 %
Eosinophils Absolute: 0.5 10*3/uL (ref 0.0–0.7)
Eosinophils Relative: 7 %
HCT: 37.9 % — ABNORMAL LOW (ref 39.0–52.0)
Hemoglobin: 11.6 g/dL — ABNORMAL LOW (ref 13.0–17.0)
Lymphocytes Relative: 29 %
Lymphs Abs: 2.1 10*3/uL (ref 0.7–4.0)
MCH: 23.8 pg — ABNORMAL LOW (ref 26.0–34.0)
MCHC: 30.6 g/dL (ref 30.0–36.0)
MCV: 77.8 fL — ABNORMAL LOW (ref 78.0–100.0)
Monocytes Absolute: 0.5 10*3/uL (ref 0.1–1.0)
Monocytes Relative: 7 %
Neutro Abs: 4.1 10*3/uL (ref 1.7–7.7)
Neutrophils Relative %: 56 %
Platelets: 296 10*3/uL (ref 150–400)
RBC: 4.87 MIL/uL (ref 4.22–5.81)
RDW: 16.7 % — ABNORMAL HIGH (ref 11.5–15.5)
WBC: 7.2 10*3/uL (ref 4.0–10.5)

## 2016-01-31 LAB — BASIC METABOLIC PANEL
Anion gap: 8 (ref 5–15)
BUN: 19 mg/dL (ref 6–20)
CO2: 24 mmol/L (ref 22–32)
Calcium: 9.4 mg/dL (ref 8.9–10.3)
Chloride: 108 mmol/L (ref 101–111)
Creatinine, Ser: 1.14 mg/dL (ref 0.61–1.24)
GFR calc Af Amer: >60 mL/min (ref >60)
GFR calc non Af Amer: >60 mL/min (ref >60)
Glucose, Bld: 86 mg/dL (ref 65–99)
Potassium: 4.2 mmol/L (ref 3.5–5.1)
Sodium: 140 mmol/L (ref 135–145)

## 2016-01-31 LAB — SURGICAL PCR SCREEN
MRSA, PCR: NEGATIVE
Staphylococcus aureus: POSITIVE — AB

## 2016-01-31 NOTE — Progress Notes (Addendum)
PCP - Webb Silversmith Cardiologist - Dr Fletcher Anon office called  Chest x-ray - 01/31/16 EKG - 11/13/15 Stress Test - 12/30/13 ECHO - 03/27/15 Cardiac Cath -2015  Will send to anesthesia for review of cardiac history  Called office about elqulis instructions left message to call back Will draw PT/INR, PTT DOS    Patient denies shortness of breath, fever, cough and chest pain at PAT appointment

## 2016-01-31 NOTE — Telephone Encounter (Signed)
S/w Janett Billow @ [facility] Short Stay. Pt scheduled for a total knee revision Jan 18 with Ulyses Southward, MD. He takes eliquis 5mg  for Afib in the setting of massive pulmonary embolism in 03/2015. Route to MD to advise if pt can hold eliquis prior to procedure

## 2016-01-31 NOTE — Progress Notes (Signed)
I called Dr. Tyrell Antonio office to determine when the patient needs to stop Elqulis.  Left message for the nurse to call me back with those directions.  Instructed her that I will leave at 4pm today and that if she is unable to speak with me then she will need to call the patient with those instructions.  I instructed patient that if he does not get a call from me or the office today then he will need to call Dr. Tyrell Antonio office tomorrow and ask about that.

## 2016-01-31 NOTE — Progress Notes (Addendum)
Called in prescription at [facility] [place] [phone], no way to leave message for MR [place] Attempted to call Mr. Roshod that I have not heard anything about further instructions for Eliquis.  Will follow up tomorrow

## 2016-01-31 NOTE — Telephone Encounter (Signed)
Nurse with [facility] needs direction on pt Eliquis for upcoming knee surgery on 1/18. Please call.

## 2016-01-31 NOTE — Pre-Procedure Instructions (Signed)
    Richard [name]  01/31/2016      [id number] [address], [place] [facility] [address] [address] Phone: [phone] Fax: [phone]    Your procedure is scheduled on January 18  Report to [place] at 0830 A.M.  Call this number if you have problems the morning of surgery:  [phone]   Remember:  Do not eat food or drink liquids after midnight.   Take these medicines the morning of surgery with A SIP OF WATER acetaminophen (TYLENOL) if needed, baclofen (LIORESAL), esomeprazole (NEXIUM), flecainide (TAMBOCOR),    Do not wear jewelry, make-up or nail polish.  Do not wear lotions, powders, or cologne, or deoderant.   Men may shave face and neck.  Do not bring valuables to the hospital.  [facility] is not responsible for any belongings or valuables.  Contacts, dentures or bridgework may not be worn into surgery.  Leave your suitcase in the car.  After surgery it may be brought to your room.  For patients admitted to the hospital, discharge time will be determined by your treatment team.  Patients discharged the day of surgery will not be allowed to drive home.    Special instructions:   [place]- Preparing For Surgery  Before surgery, you can play an important role. Because skin is not sterile, your skin needs to be as free of germs as possible. You can reduce the number of germs on your skin by washing with CHG (chlorahexidine gluconate) Soap before surgery.  CHG is an antiseptic cleaner which kills germs and bonds with the skin to continue killing germs even after washing.  Please do not use if you have an allergy to CHG or antibacterial soaps. If your skin becomes reddened/irritated stop using the CHG.  Do not shave (including legs and underarms) for at least 48 hours prior to first CHG shower. It is OK to shave your face.  Please follow these instructions carefully.   1. Shower the NIGHT BEFORE SURGERY and the  MORNING OF SURGERY with CHG.   2. If you chose to wash your hair, wash your hair first as usual with your normal shampoo.  3. After you shampoo, rinse your hair and body thoroughly to remove the shampoo.  4. Use CHG as you would any other liquid soap. You can apply CHG directly to the skin and wash gently with a scrungie or a clean washcloth.   5. Apply the CHG Soap to your body ONLY FROM THE NECK DOWN.  Do not use on open wounds or open sores. Avoid contact with your eyes, ears, mouth and genitals (private parts). Wash genitals (private parts) with your normal soap.  6. Wash thoroughly, paying special attention to the area where your surgery will be performed.  7. Thoroughly rinse your body with warm water from the neck down.  8. DO NOT shower/wash with your normal soap after using and rinsing off the CHG Soap.  9. Pat yourself dry with a CLEAN TOWEL.   10. Wear CLEAN PAJAMAS   11. Place CLEAN SHEETS on your bed the night of your first shower and DO NOT SLEEP WITH PETS.    Day of Surgery: Do not apply any deodorants/lotions. Please wear clean clothes to the hospital/surgery center.      Please read over the following fact sheets that you were given.

## 2016-02-01 ENCOUNTER — Ambulatory Visit: Payer: Managed Care, Other (non HMO) | Admitting: Physician Assistant

## 2016-02-01 ENCOUNTER — Telehealth: Payer: Self-pay | Admitting: Cardiovascular Disease

## 2016-02-01 NOTE — Telephone Encounter (Signed)
Pt calling stating he's been up since 3 am  Checked his HR is since this morning its been around 48-53 He is asking if he needs to take his medication  For that lowers his HR  He is nervous also he states for he is to go out of town today  Would like to know if he is okay to do so  Please call back

## 2016-02-01 NOTE — Telephone Encounter (Signed)
Hold 2 days before surgery. Resume the day after surgery.

## 2016-02-01 NOTE — Telephone Encounter (Signed)
Returned call to patient. He stated he woke up around 0300 this morning due to leg/knee pain. He is due for knee surgery on 02/08/16. He did not sleep well last night after going to his pre-op appt for surgery yesterday. He stated he feels "nervous and anxious" about it and couldn't sleep. When he woke up he felt weak, took his heart rate with his pulse oximeter and it was around 48-53. He decided to get up and move around and that did not help his heart rate increase. Patient took HR while on the phone and it ranged from 43 to 51 with O@ 95-96% on RA. Denies CP, Palpitations, or Dizziness. States I feel nervous, tired, and a little SOB. Patient did not have a BP cuff at home to take BP. Yesterday at preop visit, BP 143/69, HR 55.  Patient does not want to take his medications this morning. Reviewed med list with patient and he is taking as prescribed. He last took his metoprolol and flecainide last evening. He is supposed to go out of town today towards [facility] and is not sure if he should go. Advised patient I will route to Christell Faith, PA as he last saw the patient 11/13/15. Patient was supposed to f/u with Thurmond Butts or Fletcher Anon in 1 month at that time but did not.  Routing to [employer], [place] for advice.

## 2016-02-01 NOTE — Progress Notes (Addendum)
I spoke with Richard [name] this morning and he picked up his prescription at his pharmacy and he was on the phone with his heart doctor when I called to discuss his instructions about his blood thinner  Spoke with patient on 02/01/16 at 0811am

## 2016-02-01 NOTE — Telephone Encounter (Signed)
Returned call to patient. He verbalized understanding to hold metoprolol until further instructions, and keep log of BP and HR and to call back in the next 48-72 hours with the readings.    He will call us back tomorrow if needed.

## 2016-02-01 NOTE — Progress Notes (Addendum)
Anesthesia Chart Review: Patient is a 56 year old male scheduled for revision of left TKA on 02/08/16 by Dr. Mayer Camel. Procedure is posted for spinal anesthesia.  History includes HTN, chronic diastolic CHF, normal coronaries '15, PAF, OSA with CPAP, GERD, obesity (BMI consistent with morbid obesity), non-smoker, s/p right knee arthroplasty medial compartment 02/25/14, s/p left TKA 12/06/14, left knee manipulation 01/20/15, 05/30/15, 08/18/15, and I&D with poly exchange placement of antibiotic impregnated PMMA spacer 10/30/15.  - On 03/27/15 he was presented with right sided chest pain and was diagnosed with occlusive left femoral-popliteal DVT and bilateral PE. He was also slightly hypoxia and required oxygen. He was admitted to [facility] on IV heparin. His symptoms progressed with requirement of BiPAP and transfer to ICU. He also developed afib with RVR. Pulmonology, vascular surgery, and cardiology consulted. On 03/29/15, he underwent TPA and insertion of IVC filter Celect type [facility], Dr. Sharyl Nimrod). He was discharge on 04/03/15 on Eliquis and amiodarone.  - On 11/11/15 he presented to the ED with pleuritic chest pain with tachycardia and palpitations and was found to be I rapid afib (160's-180's) that spontaneously converted.  CTA was negative for PE. Cardiologist Dr. Harl Bowie was consulted. Troponin negative X 2. Chest pain symptoms improved with GI cocktail. Flecainide 150 mg PRN prescribed. Eliquis and Toprol continued. He saw Christell Faith, PA-C on 11/13/15 for follow-up. He had recurrent tachycardia and routine flecainide ordered. She also ordered a stress test due to recurrent chest pains. I don't see that this was done. He canceled 11/23/15 visit with Roderic Palau, NP at the [facility].  PCP is Webb Silversmith, NP at [facility]. Patient is also being followed by [facility].  Cardiologist is Dr. Fletcher Anon (previously saw Dr. Irish Lack in 2015). Patient first noted to have PAF in  06/2015 in the setting of massive PE. Antiarrhythmic therapy recommended if he developed recurrent afib, which did happen in 10/2015. Given DVT and burden of his PE, he felt patient my require lifelong anticoagulation.   Medications include Eliquis, baclofen, Nexium, flexeril, flecainide, Toprol XL, Roxicet, tramadol. Dr. Fletcher Anon gave permission to hold Eliquis for 2 days prior to surgery and resume the day after surgery. (Toprol placed on hold for 48-72 hours starting 02/01/16 due to bradycardia. He is to call back with BP and HR log for further instructions.)  BP (!) 143/69   Pulse (!) 55   Temp 36.9 C (Oral)   Resp 20   Ht 5\' 9"  (1.753 m)   Wt 276 lb 6 oz (125.4 kg)   SpO2 100%   BMI 40.81 kg/m   EKG 11/13/15: NSR, PACs, LAFB.   Echo 03/27/15 (in the setting of acute bilateral PE): Study Conclusions - Procedure narrative: Transthoracic echocardiography. Image  quality was suboptimal. The study was technically difficult, as a  result of poor acoustic windows, poor sound wave transmission,  and body habitus. - Left ventricle: Systolic function was normal. The estimated  ejection fraction was in the range of 55% to 65%. - Aortic valve: Valve area (Vmax): 3.3 cm^2. - Right ventricle: The cavity size was mildly dilated.  Cardiac cath 01/12/14 (for abnormal stress test 01/07/14): 1. Normal left main coronary artery. 2. Widely patent left anterior descending artery and its branches. 3. Widely patent left circumflex artery and its branches. 4. Widely patent right coronary artery. 5. Normal left ventricular systolic function. LVEDP 20 mmHg. Ejection fraction 60 %.  CXR 11/11/15: IMPRESSION: No acute cardiopulmonary process. Stable position of RIGHT  PICC.  Chest CTA 11/11/15: IMPRESSION: No evidence of pulmonary emboli. Previously seen chronic pulmonary emboli have resolved in the interval. Hilar and mediastinal adenopathy is again identified but slightly increased in size  when compared with the prior exam. Clinical correlation is recommended. No new focal abnormality is noted.  Preoperative labs noted. Last A1c 5.5 on 06/22/15. He is for PT/PTT on the day of surgery.  Patient had atypical chest pains in October. He did not have the stress test that was discussed. Cath in 12/2013 showed normal coronaries. He denied CP and SOB at PAT, but was having issues with bradycardia within the past 24 hours. I will send a staff message to Christell Faith and Dr. Fletcher Anon to clarify what, if anything, is needed prior to surgery. Also case is posted for spinal, so will need to see if Eliquis can be safely held for three days. Will leave chart for follow-up.  Richard [name] Short Stay Center/Anesthesiology Phone [phone] 02/01/2016 5:48 PM  Addendum: I heard back from Dr. Fletcher Anon. He felt that since DVT/PE was > 6 months ago that it should be okay for patient to hold Eliquis for 3 days prior to surgery and resume as soon as possible post-operatively. He also did not feel that patient would require further testing prior to surgery. I called and spoke with patient. He reported his last Eliquis dose (before surgery) 02/04/16 PM. He reported HR and BP were doing okay now. He is back on Toprol and flecainide and will continue periodic home monitoring as he has done in the past and let his cardiologist know if any recurrent issues with bradycardia. If no acute changes then I would anticipate that he could proceed as planned.  Richard Hugh [facility] Short Stay Center/Anesthesiology Phone [phone] 02/05/2016 10:46 AM

## 2016-02-01 NOTE — Telephone Encounter (Signed)
S/w with Janett Billow. She has seen this encounter in EPIC and already contacted the patient concerning the Eliquis.

## 2016-02-01 NOTE — Telephone Encounter (Signed)
Hold Toprol XL. May need a lower dose. Call back with readings over the next 48-72 hours.

## 2016-02-01 NOTE — Telephone Encounter (Signed)
S/w with patient and offered him appt for today at 3 pm with Laurine Blazer. Patient said he has to leave to go out of town at 1 pm and cannot come to appt. Patient had taken his BP after getting off the phone with me earlier.  It was 145/71, HR 51 at 0935 am. He reported not feeling as nervous and tired. Heartrate while on the phone 48-49.  Patient instructed to call 911 or go to ER is heart rate decreases significantly more and he experiences CP, SOB, dizziness, palpitations, extreme weakness. He verbalized understanding.

## 2016-02-02 ENCOUNTER — Other Ambulatory Visit: Payer: Self-pay | Admitting: Internal Medicine

## 2016-02-03 ENCOUNTER — Other Ambulatory Visit: Payer: Self-pay | Admitting: Internal Medicine

## 2016-02-05 NOTE — Telephone Encounter (Signed)
Noted. It appears Dr. Fletcher Anon has also cleared patient for surgery with holding of DOAC x 3 days prior to surgery given spinal anesthesia with resumption as soon as safely possible, dictated by the surgical team.

## 2016-02-05 NOTE — Telephone Encounter (Signed)
S/w patient. Patient stated he's doing much better. He does not feel tired and weak anymore. BP readings are as follows: 1/12 - morning 119/63, HR 63; pm 123/63, HR 58; 129/62, HR 88  1/13 - morning 117/59, HR 58; pm 144/66, HR 65 1/14 - he did not record any readings 1/15 (today) - 139/70, HR 65.  Patient did not take Metoprolol on Thursday or Friday morning but restarted it on Friday night and took it the whole weekend. Will route to Christell Faith, PA for review and further advice if needed.

## 2016-02-06 NOTE — H&P (Signed)
TOTAL KNEE REVISION ADMISSION H&P  Patient is being admitted for left revision total knee arthroplasty.  Subjective:  Chief Complaint:left knee pain.  HPI: Richard [name], 56 y.o. male, has a history of pain and functional disability in the left knee(s) due to failed previous arthroplasty and patient has failed non-surgical conservative treatments for greater than 12 weeks to include NSAID's and/or analgesics, flexibility and strengthening excercises, use of assistive devices, weight reduction as appropriate and activity modification. The indications for the revision of the total knee arthroplasty are history of total knee infection. Onset of symptoms was gradual starting 1 years ago with gradually worsening course since that time.  Prior procedures on the left knee(s) include arthroplasty and prostalac prosthesis.  Patient currently rates pain in the left knee(s) at 10 out of 10 with activity. There is night pain, worsening of pain with activity and weight bearing, pain that interferes with activities of daily living and pain with passive range of motion.  Patient has evidence of prostalac prosthesis by imaging studies. This condition presents safety issues increasing the risk of falls.  There is no current active infection.  Patient Active Problem List   Diagnosis Date Noted  . Helicobacter pylori (H. pylori) infection 12/06/2015  . Rash due to allergy 12/06/2015  . Unsteadiness on feet   . Coagulase-negative staphylococcal infection   . Infection of total left knee replacement ([place]) 10/30/2015  . 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 Arthrofibrosis of total knee arthroplasty ([place]) 05/30/2015  . PAF (paroxysmal atrial fibrillation) ([place]) 03/30/2015  . Peroneal DVT (deep venous thrombosis) ([place])   . Pulmonary embolism ([place]) 03/27/2015  . Hypertension 08/17/2011  . LVH (left ventricular hypertrophy) 08/17/2011  . GERD (gastroesophageal reflux disease)  08/17/2011  . Morbid obesity ([place]) 08/16/2011  . OSA (obstructive sleep apnea) 08/16/2011   Past Medical History:  Diagnosis Date  . Arthritis   . Arthrofibrosis of total knee arthroplasty ([place]) 05/30/2015  . Bilateral pulmonary embolism ([place])    a. 03/2015 CTA: acute bilat PE.  Marland Kitchen Chest pain    a. 2015 Abnl MV;  b. 2015 Cath: nl cors.  . Chronic diastolic CHF (congestive heart failure) ([place])    a. 03/2015 Echo: EF 55-65%, poor windows, mildly dil RV.  Marland Kitchen DVT (deep venous thrombosis) ([place])    a. 03/2015 U/S: occlusive DVT w/in the distal aspect of the Left fem vein through the L popliteal vein.  Marland Kitchen Dysrhythmia    PAF  . GERD (gastroesophageal reflux disease)   . Headache   . Hypertension   . Hypertensive heart disease   . Obesity   . OSA (obstructive sleep apnea)    uses CPAP nightly  . Primary localized osteoarthritis of left knee    a. 11/2014 s/p L TKA.  . Primary localized osteoarthritis of right knee    a. 02/2014 s/p R Partial Knee arthroplasty.  . Stiffness of left knee    a. 12/2014 s/p manipulation under anesthesia.  . Urine incontinence     Past Surgical History:  Procedure Laterality Date  . APPENDECTOMY    . CARDIAC CATHETERIZATION    . EXAM UNDER ANESTHESIA WITH MANIPULATION OF KNEE Left 05/30/2015   Procedure: EXAM UNDER ANESTHESIA WITH MANIPULATION OF LEFT KNEE;  Surgeon: Marchia Bond, MD;  Location: [place];  Service: Orthopedics;  Laterality: Left;  . I&D KNEE WITH POLY EXCHANGE Left 10/30/2015   Procedure: IRRIGATION AND DEBRIDEMENT KNEE WITH POLY EXCHANGE PLACE SPACERS;  Surgeon: Pilar Plate  Mayer Camel, MD;  Location: [place];  Service: Orthopedics;  Laterality: Left;  OFF ELIQUIST 3 DAYS  . IVC FILTER PLACEMENT ([facility] HX)  04/13/15  . JOINT REPLACEMENT    . KNEE ARTHROSCOPY Left 08/08/2015   Procedure: LEFT KNEE MANIPULATION WITH ARTHROSCOPIC LYSIS OF ADHESIONS AND ASPIRATION;  Surgeon: Marchia Bond, MD;  Location: [place];  Service: Orthopedics;  Laterality: Left;  . KNEE CLOSED  REDUCTION Left 01/20/2015   Procedure: LEFT KNEE MANIPULATION;  Surgeon: Marchia Bond, MD;  Location: [place];  Service: Orthopedics;  Laterality: Left;  . LEFT HEART CATHETERIZATION WITH CORONARY ANGIOGRAM N/A 01/12/2014   Procedure: LEFT HEART CATHETERIZATION WITH CORONARY ANGIOGRAM;  Surgeon: Jettie Booze, MD;  Location: [facility] CATH LAB;  Service: Cardiovascular;  Laterality: N/A;  . ORTHOPEDIC SURGERY Left    arthroscopy x3  . PARTIAL KNEE ARTHROPLASTY Right 02/25/2014   Procedure: RIGHT KNEE ARTHROPLASTY CONDYLE AND PLATEAU MEDIAL COMPARTMENT ;  Surgeon: Johnny Bridge, MD;  Location: [place];  Service: Orthopedics;  Laterality: Right;  . PERIPHERAL VASCULAR CATHETERIZATION N/A 03/29/2015   Procedure: IVC Filter Insertion, and possible thrombectomy;  Surgeon: Katha Cabal, MD;  Location: [place] CV LAB;  Service: Cardiovascular;  Laterality: N/A;  . TOTAL KNEE ARTHROPLASTY  12/06/2014   Procedure: TOTAL KNEE ARTHROPLASTY;  Surgeon: Marchia Bond, MD;  Location: [place];  Service: Orthopedics;;    No prescriptions prior to admission.   Allergies  Allergen Reactions  . Vancomycin Other (See Comments)    Red Mans' syndrome  . Penicillins Other (See Comments)    Received ancef 3gms with no obvious reaction. 08/08/15 Has patient had a PCN reaction causing immediate rash, facial/tongue/throat swelling, SOB or lightheadedness with hypotension: Unknown, childhood reaction Has patient had a PCN reaction causing severe rash involving mucus membranes or skin necrosis: No Has patient had a PCN reaction that required hospitalization No Has patient had a PCN reaction occurring within the last 10 years: No If all of the above answers are "NO", then may proceed with Ce    Social History  Substance Use Topics  . Smoking status: Never Smoker  . Smokeless tobacco: Never Used  . Alcohol use Yes     Comment: occassional    Family History  Problem  Relation Age of Onset  . Coronary artery disease Mother   . Hyperlipidemia Mother   . Hypertension Mother   . Arthritis Mother   . Coronary artery disease Father   . Heart disease Paternal Grandmother   . Alzheimer's disease Paternal Grandfather       Review of Systems  Constitutional: Positive for malaise/fatigue.  HENT: Negative.   Eyes: Negative.   Respiratory: Negative.   Cardiovascular:       HTN and hx of blood clots  Gastrointestinal: Negative.   Genitourinary:       Weak stream  Musculoskeletal: Positive for joint pain and myalgias.  Skin: Negative.   Neurological: Negative.   Endo/Heme/Allergies: Negative.   Psychiatric/Behavioral: Negative.      Objective:  Physical Exam  Constitutional: He is oriented to person, place, and time. He appears well-developed and well-nourished.  HENT:  Head: Normocephalic and atraumatic.  Eyes: Pupils are equal, round, and reactive to light.  Neck: Normal range of motion. Neck supple.  Cardiovascular: Intact distal pulses.   Respiratory: Effort normal.  Musculoskeletal: He exhibits tenderness.  The knee itself has a range of motion from 5-60. There is crepitus from the cement rubbing on the  cement.  There is no palpable effusion.  Collateral ligaments are stable.    Neurological: He is alert and oriented to person, place, and time.  Skin: Skin is warm and dry.  Psychiatric: He has a normal mood and affect. His behavior is normal. Judgment and thought content normal.    Vital signs in last 24 hours:    Labs:  Estimated body mass index is 40.81 kg/m as calculated from the following:   Height as of 01/31/16: 5\' 9"  (1.753 m).   Weight as of 01/31/16: 125.4 kg (276 lb 6 oz).  Imaging Review Plain radiographs demonstrate AP and lateral x-rays show well-placed KASSM articulated spacer.  Assessment/Plan:  End stage arthritis, left knee(s) with failed previous arthroplasty.   The patient history, physical examination,  clinical judgment of the provider and imaging studies are consistent with end stage degenerative joint disease of the left knee(s), previous total knee arthroplasty. Revision total knee arthroplasty is deemed medically necessary. The treatment options including medical management, injection therapy, arthroscopy and revision arthroplasty were discussed at length. The risks and benefits of revision total knee arthroplasty were presented and reviewed. The risks due to aseptic loosening, infection, stiffness, patella tracking problems, thromboembolic complications and other imponderables were discussed. The patient acknowledged the explanation, agreed to proceed with the plan and consent was signed. Patient is being admitted for inpatient treatment for surgery, pain control, PT, OT, prophylactic antibiotics, VTE prophylaxis, progressive ambulation and ADL's and discharge planning.The patient is planning to be discharged home with home health services

## 2016-02-06 NOTE — Telephone Encounter (Signed)
Last filled 12/29/15--please advise 

## 2016-02-07 DIAGNOSIS — Z89529 Acquired absence of unspecified knee: Secondary | ICD-10-CM | POA: Diagnosis present

## 2016-02-07 MED ORDER — TRANEXAMIC ACID 1000 MG/10ML IV SOLN
1000.0000 mg | INTRAVENOUS | Status: DC
  Filled 2016-02-07: qty 10

## 2016-02-07 MED ORDER — TRANEXAMIC ACID 1000 MG/10ML IV SOLN
2000.0000 mg | Freq: Once | INTRAVENOUS | Status: DC
  Filled 2016-02-07: qty 20

## 2016-02-07 MED ORDER — BUPIVACAINE LIPOSOME 1.3 % IJ SUSP
20.0000 mL | Freq: Once | INTRAMUSCULAR | Status: DC
  Filled 2016-02-07: qty 20

## 2016-02-08 ENCOUNTER — Inpatient Hospital Stay (HOSPITAL_COMMUNITY): Payer: Managed Care, Other (non HMO) | Admitting: Vascular Surgery

## 2016-02-08 ENCOUNTER — Encounter (HOSPITAL_COMMUNITY): Payer: Self-pay | Admitting: Anesthesiology

## 2016-02-08 ENCOUNTER — Inpatient Hospital Stay (HOSPITAL_COMMUNITY)
Admission: RE | Admit: 2016-02-08 | Discharge: 2016-02-10 | DRG: 467 | Disposition: A | Discharge status: Home Health | Payer: Managed Care, Other (non HMO) | Source: Ambulatory Visit | Attending: Orthopedic Surgery | Admitting: Orthopedic Surgery

## 2016-02-08 ENCOUNTER — Inpatient Hospital Stay (HOSPITAL_COMMUNITY): Payer: Managed Care, Other (non HMO) | Admitting: Anesthesiology

## 2016-02-08 ENCOUNTER — Encounter (HOSPITAL_COMMUNITY)
Admission: RE | Disposition: A | Discharge status: Home Health | Payer: Self-pay | Source: Ambulatory Visit | Attending: Orthopedic Surgery

## 2016-02-08 ENCOUNTER — Inpatient Hospital Stay (HOSPITAL_COMMUNITY): Payer: Managed Care, Other (non HMO)

## 2016-02-08 DIAGNOSIS — Z86711 Personal history of pulmonary embolism: Secondary | ICD-10-CM

## 2016-02-08 DIAGNOSIS — I110 Hypertensive heart disease with heart failure: Secondary | ICD-10-CM | POA: Diagnosis present

## 2016-02-08 DIAGNOSIS — I480 Paroxysmal atrial fibrillation: Secondary | ICD-10-CM | POA: Diagnosis present

## 2016-02-08 DIAGNOSIS — Z89529 Acquired absence of unspecified knee: Secondary | ICD-10-CM

## 2016-02-08 DIAGNOSIS — Z86718 Personal history of other venous thrombosis and embolism: Secondary | ICD-10-CM

## 2016-02-08 DIAGNOSIS — Y831 Surgical operation with implant of artificial internal device as the cause of abnormal reaction of the patient, or of later complication, without mention of misadventure at the time of the procedure: Secondary | ICD-10-CM | POA: Diagnosis present

## 2016-02-08 DIAGNOSIS — I5032 Chronic diastolic (congestive) heart failure: Secondary | ICD-10-CM | POA: Diagnosis present

## 2016-02-08 DIAGNOSIS — G4733 Obstructive sleep apnea (adult) (pediatric): Secondary | ICD-10-CM | POA: Diagnosis present

## 2016-02-08 DIAGNOSIS — Z880 Allergy status to penicillin: Secondary | ICD-10-CM

## 2016-02-08 DIAGNOSIS — K219 Gastro-esophageal reflux disease without esophagitis: Secondary | ICD-10-CM | POA: Diagnosis present

## 2016-02-08 DIAGNOSIS — Z6841 Body Mass Index (BMI) 40.0 and over, adult: Secondary | ICD-10-CM | POA: Diagnosis not present

## 2016-02-08 DIAGNOSIS — T8454XA Infection and inflammatory reaction due to internal left knee prosthesis, initial encounter: Secondary | ICD-10-CM | POA: Diagnosis present

## 2016-02-08 DIAGNOSIS — Z96652 Presence of left artificial knee joint: Secondary | ICD-10-CM

## 2016-02-08 DIAGNOSIS — M25562 Pain in left knee: Secondary | ICD-10-CM | POA: Diagnosis present

## 2016-02-08 HISTORY — PX: TOTAL KNEE REVISION: SHX996

## 2016-02-08 LAB — PROTIME-INR
INR: 1.05
Prothrombin Time: 13.7 seconds (ref 11.4–15.2)

## 2016-02-08 LAB — APTT: aPTT: 27 seconds (ref 24–36)

## 2016-02-08 SURGERY — TOTAL KNEE REVISION
Anesthesia: General | Site: Knee | Laterality: Left

## 2016-02-08 MED ORDER — METOPROLOL SUCCINATE ER 25 MG PO TB24
25.0000 mg | ORAL_TABLET | Freq: Two times a day (BID) | ORAL | Status: DC
Start: 2016-02-08 — End: 2016-02-10
  Administered 2016-02-09 – 2016-02-10 (×3): 25 mg via ORAL
  Filled 2016-02-08 (×4): qty 1

## 2016-02-08 MED ORDER — FLEET ENEMA 7-19 GM/118ML RE ENEM: 1.0000 | ENEMA | Freq: Once | RECTAL | Status: DC | PRN

## 2016-02-08 MED ORDER — DEXTROSE 5 % IV SOLN
INTRAVENOUS | Status: DC | PRN
  Administered 2016-02-08: 3 g via INTRAVENOUS

## 2016-02-08 MED ORDER — ACETAMINOPHEN 10 MG/ML IV SOLN
INTRAVENOUS | Status: AC
  Filled 2016-02-08: qty 100

## 2016-02-08 MED ORDER — SODIUM CHLORIDE 0.9 % IJ SOLN
INTRAMUSCULAR | Status: DC | PRN
  Administered 2016-02-08: 50 mL

## 2016-02-08 MED ORDER — OXYCODONE HCL 5 MG PO TABS
5.0000 mg | ORAL_TABLET | ORAL | Status: DC | PRN
  Administered 2016-02-08 – 2016-02-10 (×9): 10 mg via ORAL
  Filled 2016-02-08 (×9): qty 2

## 2016-02-08 MED ORDER — BUPIVACAINE LIPOSOME 1.3 % IJ SUSP
INTRAMUSCULAR | Status: DC | PRN
  Administered 2016-02-08: 20 mL

## 2016-02-08 MED ORDER — SENNOSIDES-DOCUSATE SODIUM 8.6-50 MG PO TABS: 1.0000 | ORAL_TABLET | Freq: Every evening | ORAL | Status: DC | PRN

## 2016-02-08 MED ORDER — CHLORHEXIDINE GLUCONATE 4 % EX LIQD: 60.0000 mL | Freq: Once | CUTANEOUS | Status: DC

## 2016-02-08 MED ORDER — DOCUSATE SODIUM 100 MG PO CAPS
100.0000 mg | ORAL_CAPSULE | Freq: Two times a day (BID) | ORAL | Status: DC
  Administered 2016-02-08 – 2016-02-10 (×3): 100 mg via ORAL
  Filled 2016-02-08 (×4): qty 1

## 2016-02-08 MED ORDER — FENTANYL CITRATE (PF) 100 MCG/2ML IJ SOLN
INTRAMUSCULAR | Status: AC
  Filled 2016-02-08: qty 2

## 2016-02-08 MED ORDER — FENTANYL CITRATE (PF) 100 MCG/2ML IJ SOLN
INTRAMUSCULAR | Status: AC
  Administered 2016-02-08: 50 ug
  Filled 2016-02-08: qty 2

## 2016-02-08 MED ORDER — FLECAINIDE ACETATE 50 MG PO TABS
50.0000 mg | ORAL_TABLET | Freq: Two times a day (BID) | ORAL | Status: DC
  Administered 2016-02-08 – 2016-02-10 (×4): 50 mg via ORAL
  Filled 2016-02-08 (×5): qty 1

## 2016-02-08 MED ORDER — SODIUM CHLORIDE 0.9 % IR SOLN
Status: DC | PRN
  Administered 2016-02-08: 3000 mL

## 2016-02-08 MED ORDER — OXYCODONE-ACETAMINOPHEN 5-325 MG PO TABS: 1.0000 | ORAL_TABLET | ORAL | 0 refills | Status: DC | PRN

## 2016-02-08 MED ORDER — CEFUROXIME SODIUM 1.5 G IJ SOLR
INTRAMUSCULAR | Status: DC | PRN
  Administered 2016-02-08: 1.5 g

## 2016-02-08 MED ORDER — EPHEDRINE 5 MG/ML INJ
INTRAVENOUS | Status: AC
  Filled 2016-02-08: qty 10

## 2016-02-08 MED ORDER — MIDAZOLAM HCL 2 MG/2ML IJ SOLN
INTRAMUSCULAR | Status: AC
  Administered 2016-02-08: 2 mg
  Filled 2016-02-08: qty 2

## 2016-02-08 MED ORDER — SUCCINYLCHOLINE CHLORIDE 20 MG/ML IJ SOLN
INTRAMUSCULAR | Status: DC | PRN
  Administered 2016-02-08: 120 mg via INTRAVENOUS

## 2016-02-08 MED ORDER — PANTOPRAZOLE SODIUM 40 MG PO TBEC: 80.0000 mg | DELAYED_RELEASE_TABLET | Freq: Every day | ORAL | Status: DC | PRN

## 2016-02-08 MED ORDER — FENTANYL CITRATE (PF) 100 MCG/2ML IJ SOLN
INTRAMUSCULAR | Status: AC
  Administered 2016-02-08: 150 ug
  Filled 2016-02-08: qty 2

## 2016-02-08 MED ORDER — HYDROMORPHONE HCL 2 MG/ML IJ SOLN
1.0000 mg | INTRAMUSCULAR | Status: DC | PRN
  Administered 2016-02-08 (×2): 1 mg via INTRAVENOUS
  Filled 2016-02-08 (×2): qty 1

## 2016-02-08 MED ORDER — TOBRAMYCIN SULFATE 1.2 G IJ SOLR
INTRAMUSCULAR | Status: AC
  Filled 2016-02-08: qty 1.2

## 2016-02-08 MED ORDER — METHOCARBAMOL 1000 MG/10ML IJ SOLN
500.0000 mg | Freq: Four times a day (QID) | INTRAVENOUS | Status: DC | PRN
  Filled 2016-02-08: qty 5

## 2016-02-08 MED ORDER — LACTATED RINGERS IV SOLN
INTRAVENOUS | Status: DC
  Administered 2016-02-08 (×4): via INTRAVENOUS

## 2016-02-08 MED ORDER — PROPOFOL 10 MG/ML IV BOLUS
INTRAVENOUS | Status: DC | PRN
  Administered 2016-02-08: 200 mg via INTRAVENOUS

## 2016-02-08 MED ORDER — ONDANSETRON HCL 4 MG/2ML IJ SOLN
INTRAMUSCULAR | Status: AC
  Filled 2016-02-08: qty 2

## 2016-02-08 MED ORDER — METOCLOPRAMIDE HCL 5 MG/ML IJ SOLN: 5.0000 mg | Freq: Three times a day (TID) | INTRAMUSCULAR | Status: DC | PRN

## 2016-02-08 MED ORDER — HYDROMORPHONE HCL 1 MG/ML IJ SOLN
INTRAMUSCULAR | Status: AC
  Filled 2016-02-08: qty 0.5

## 2016-02-08 MED ORDER — FENTANYL CITRATE (PF) 100 MCG/2ML IJ SOLN
INTRAMUSCULAR | Status: AC
  Filled 2016-02-08: qty 4

## 2016-02-08 MED ORDER — FUROSEMIDE 40 MG PO TABS
40.0000 mg | ORAL_TABLET | Freq: Every day | ORAL | Status: DC
Start: 2016-02-09 — End: 2016-02-10
  Administered 2016-02-10: 40 mg via ORAL
  Filled 2016-02-08 (×3): qty 1

## 2016-02-08 MED ORDER — BUPIVACAINE HCL (PF) 0.25 % IJ SOLN
INTRAMUSCULAR | Status: DC | PRN
  Administered 2016-02-08: 60 mL

## 2016-02-08 MED ORDER — MIDAZOLAM HCL 2 MG/2ML IJ SOLN
INTRAMUSCULAR | Status: AC
  Filled 2016-02-08: qty 2

## 2016-02-08 MED ORDER — EPHEDRINE SULFATE 50 MG/ML IJ SOLN
INTRAMUSCULAR | Status: DC | PRN
  Administered 2016-02-08 (×2): 10 mg via INTRAVENOUS
  Administered 2016-02-08: 5 mg via INTRAVENOUS
  Administered 2016-02-08 (×2): 10 mg via INTRAVENOUS
  Administered 2016-02-08: 5 mg via INTRAVENOUS
  Administered 2016-02-08: 10 mg via INTRAVENOUS

## 2016-02-08 MED ORDER — 0.9 % SODIUM CHLORIDE (POUR BTL) OPTIME
TOPICAL | Status: DC | PRN
  Administered 2016-02-08: 1000 mL

## 2016-02-08 MED ORDER — MENTHOL 3 MG MT LOZG: 1.0000 | LOZENGE | OROMUCOSAL | Status: DC | PRN

## 2016-02-08 MED ORDER — TOBRAMYCIN SULFATE 1.2 G IJ SOLR
INTRAMUSCULAR | Status: DC | PRN
  Administered 2016-02-08: 1.2 g

## 2016-02-08 MED ORDER — ACETAMINOPHEN 10 MG/ML IV SOLN
INTRAVENOUS | Status: DC | PRN
  Administered 2016-02-08: 1000 mg via INTRAVENOUS

## 2016-02-08 MED ORDER — ALUM & MAG HYDROXIDE-SIMETH 200-200-20 MG/5ML PO SUSP: 30.0000 mL | ORAL | Status: DC | PRN

## 2016-02-08 MED ORDER — SUGAMMADEX SODIUM 500 MG/5ML IV SOLN
INTRAVENOUS | Status: AC
  Filled 2016-02-08: qty 5

## 2016-02-08 MED ORDER — PHENOL 1.4 % MT LIQD: 1.0000 | OROMUCOSAL | Status: DC | PRN

## 2016-02-08 MED ORDER — ONDANSETRON HCL 4 MG PO TABS: 4.0000 mg | ORAL_TABLET | Freq: Four times a day (QID) | ORAL | Status: DC | PRN

## 2016-02-08 MED ORDER — METOCLOPRAMIDE HCL 5 MG PO TABS: 5.0000 mg | ORAL_TABLET | Freq: Three times a day (TID) | ORAL | Status: DC | PRN

## 2016-02-08 MED ORDER — CEFAZOLIN IN D5W 1 GM/50ML IV SOLN
INTRAVENOUS | Status: AC
  Filled 2016-02-08: qty 50

## 2016-02-08 MED ORDER — ROCURONIUM BROMIDE 50 MG/5ML IV SOSY
PREFILLED_SYRINGE | INTRAVENOUS | Status: AC
  Filled 2016-02-08: qty 5

## 2016-02-08 MED ORDER — ONDANSETRON HCL 4 MG/2ML IJ SOLN
4.0000 mg | Freq: Four times a day (QID) | INTRAMUSCULAR | Status: DC | PRN
  Administered 2016-02-08: 4 mg via INTRAVENOUS
  Filled 2016-02-08: qty 2

## 2016-02-08 MED ORDER — APIXABAN 2.5 MG PO TABS: 2.5000 mg | ORAL_TABLET | Freq: Two times a day (BID) | ORAL | 0 refills | Status: DC

## 2016-02-08 MED ORDER — METHOCARBAMOL 500 MG PO TABS
500.0000 mg | ORAL_TABLET | Freq: Four times a day (QID) | ORAL | Status: DC | PRN
  Administered 2016-02-08 – 2016-02-10 (×4): 500 mg via ORAL
  Filled 2016-02-08 (×4): qty 1

## 2016-02-08 MED ORDER — ROCURONIUM BROMIDE 100 MG/10ML IV SOLN
INTRAVENOUS | Status: DC | PRN
  Administered 2016-02-08: 50 mg via INTRAVENOUS

## 2016-02-08 MED ORDER — ACETAMINOPHEN 650 MG RE SUPP: 650.0000 mg | Freq: Four times a day (QID) | RECTAL | Status: DC | PRN

## 2016-02-08 MED ORDER — METHOCARBAMOL 500 MG PO TABS: 500.0000 mg | ORAL_TABLET | Freq: Two times a day (BID) | ORAL | 0 refills | Status: DC

## 2016-02-08 MED ORDER — LIDOCAINE HCL (CARDIAC) 20 MG/ML IV SOLN
INTRAVENOUS | Status: DC | PRN
  Administered 2016-02-08: 100 mg via INTRAVENOUS

## 2016-02-08 MED ORDER — PROPOFOL 10 MG/ML IV BOLUS
INTRAVENOUS | Status: AC
  Filled 2016-02-08: qty 20

## 2016-02-08 MED ORDER — BISMUTH SUBSALICYLATE 262 MG PO CHEW
524.0000 mg | CHEWABLE_TABLET | Freq: Three times a day (TID) | ORAL | Status: DC | PRN
  Filled 2016-02-08: qty 2

## 2016-02-08 MED ORDER — BUPIVACAINE HCL (PF) 0.25 % IJ SOLN
INTRAMUSCULAR | Status: AC
  Filled 2016-02-08: qty 60

## 2016-02-08 MED ORDER — TRAMADOL HCL 50 MG PO TABS: 50.0000 mg | ORAL_TABLET | Freq: Four times a day (QID) | ORAL | Status: DC | PRN

## 2016-02-08 MED ORDER — FENTANYL CITRATE (PF) 100 MCG/2ML IJ SOLN
INTRAMUSCULAR | Status: DC | PRN
  Administered 2016-02-08: 100 ug via INTRAVENOUS
  Administered 2016-02-08 (×2): 50 ug via INTRAVENOUS
  Administered 2016-02-08 (×2): 100 ug via INTRAVENOUS

## 2016-02-08 MED ORDER — PROMETHAZINE HCL 25 MG/ML IJ SOLN: 6.2500 mg | INTRAMUSCULAR | Status: DC | PRN

## 2016-02-08 MED ORDER — HYDROXYZINE HCL 25 MG PO TABS: 25.0000 mg | ORAL_TABLET | Freq: Four times a day (QID) | ORAL | Status: DC | PRN

## 2016-02-08 MED ORDER — CLINDAMYCIN PHOSPHATE 900 MG/50ML IV SOLN
INTRAVENOUS | Status: AC
  Filled 2016-02-08: qty 50

## 2016-02-08 MED ORDER — APIXABAN 2.5 MG PO TABS
2.5000 mg | ORAL_TABLET | Freq: Two times a day (BID) | ORAL | Status: DC
  Administered 2016-02-09 – 2016-02-10 (×3): 2.5 mg via ORAL
  Filled 2016-02-08 (×3): qty 1

## 2016-02-08 MED ORDER — BACLOFEN 10 MG PO TABS: 10.0000 mg | ORAL_TABLET | Freq: Three times a day (TID) | ORAL | Status: DC | PRN

## 2016-02-08 MED ORDER — HYDROMORPHONE HCL 1 MG/ML IJ SOLN
0.2500 mg | INTRAMUSCULAR | Status: DC | PRN
  Administered 2016-02-08 (×2): 0.5 mg via INTRAVENOUS

## 2016-02-08 MED ORDER — CEFAZOLIN SODIUM-DEXTROSE 2-4 GM/100ML-% IV SOLN
INTRAVENOUS | Status: AC
  Filled 2016-02-08: qty 100

## 2016-02-08 MED ORDER — SUCCINYLCHOLINE CHLORIDE 200 MG/10ML IV SOSY
PREFILLED_SYRINGE | INTRAVENOUS | Status: AC
  Filled 2016-02-08: qty 10

## 2016-02-08 MED ORDER — ACETAMINOPHEN 325 MG PO TABS: 650.0000 mg | ORAL_TABLET | Freq: Four times a day (QID) | ORAL | Status: DC | PRN

## 2016-02-08 MED ORDER — CLINDAMYCIN PHOSPHATE 900 MG/50ML IV SOLN: 900.0000 mg | INTRAVENOUS | Status: DC

## 2016-02-08 MED ORDER — LIDOCAINE 2% (20 MG/ML) 5 ML SYRINGE
INTRAMUSCULAR | Status: AC
  Filled 2016-02-08: qty 5

## 2016-02-08 MED ORDER — DIPHENHYDRAMINE HCL 12.5 MG/5ML PO ELIX: 12.5000 mg | ORAL_SOLUTION | ORAL | Status: DC | PRN

## 2016-02-08 MED ORDER — KCL IN DEXTROSE-NACL 20-5-0.45 MEQ/L-%-% IV SOLN
INTRAVENOUS | Status: DC
  Administered 2016-02-08: 125 mL via INTRAVENOUS
  Filled 2016-02-08 (×4): qty 1000

## 2016-02-08 MED ORDER — BISACODYL 5 MG PO TBEC: 5.0000 mg | DELAYED_RELEASE_TABLET | Freq: Every day | ORAL | Status: DC | PRN

## 2016-02-08 SURGICAL SUPPLY — 82 items
ADAPTER BOLT FEMORAL +2/-2 (Knees) ×2 IMPLANT
BANDAGE ACE 4X5 VEL STRL LF (GAUZE/BANDAGES/DRESSINGS) ×2 IMPLANT
BANDAGE ACE 6X5 VEL STRL LF (GAUZE/BANDAGES/DRESSINGS) ×2 IMPLANT
BANDAGE ESMARK 6X9 LF (GAUZE/BANDAGES/DRESSINGS) ×1 IMPLANT
BLADE SAG 18X100X1.27 (BLADE) ×2 IMPLANT
BLADE SAW SAG 90X13X1.27 (BLADE) ×2 IMPLANT
BLADE SURG ROTATE 9660 (MISCELLANEOUS) IMPLANT
BNDG ESMARK 6X9 LF (GAUZE/BANDAGES/DRESSINGS) ×2
BOWL SMART MIX CTS (DISPOSABLE) ×2 IMPLANT
CEMENT HV SMART SET (Cement) ×4 IMPLANT
COVER BACK TABLE 24X17X13 BIG (DRAPES) IMPLANT
COVER SURGICAL LIGHT HANDLE (MISCELLANEOUS) ×2 IMPLANT
CUFF TOURNIQUET SINGLE 34IN LL (TOURNIQUET CUFF) IMPLANT
CUFF TOURNIQUET SINGLE 44IN (TOURNIQUET CUFF) ×2 IMPLANT
DISC DIAMOND MED (BURR) IMPLANT
DRAPE HALF SHEET 40X57 (DRAPES) ×2 IMPLANT
DRAPE IMP U-DRAPE 54X76 (DRAPES) ×2 IMPLANT
DRAPE U-SHAPE 47X51 STRL (DRAPES) ×2 IMPLANT
DRSG AQUACEL AG ADV 3.5X14 (GAUZE/BANDAGES/DRESSINGS) ×2 IMPLANT
DURAPREP 26ML APPLICATOR (WOUND CARE) ×2 IMPLANT
ELECT REM PT RETURN 9FT ADLT (ELECTROSURGICAL) ×2
ELECTRODE REM PT RTRN 9FT ADLT (ELECTROSURGICAL) ×1 IMPLANT
EVACUATOR 1/8 PVC DRAIN (DRAIN) IMPLANT
FEMORAL ADAPTER (Orthopedic Implant) ×2 IMPLANT
FEMUR LFT TC3 REVISION (Knees) ×2 IMPLANT
GAUZE SPONGE 4X4 12PLY STRL (GAUZE/BANDAGES/DRESSINGS) ×4 IMPLANT
GAUZE XEROFORM 1X8 LF (GAUZE/BANDAGES/DRESSINGS) ×2 IMPLANT
GLOVE BIO SURGEON STRL SZ7.5 (GLOVE) ×2 IMPLANT
GLOVE BIO SURGEON STRL SZ8.5 (GLOVE) ×4 IMPLANT
GLOVE BIOGEL PI IND STRL 8 (GLOVE) ×2 IMPLANT
GLOVE BIOGEL PI IND STRL 9 (GLOVE) ×1 IMPLANT
GLOVE BIOGEL PI INDICATOR 8 (GLOVE) ×2
GLOVE BIOGEL PI INDICATOR 9 (GLOVE) ×1
GLOVE SURG SS PI 7.0 STRL IVOR (GLOVE) ×2 IMPLANT
GOWN STRL REUS W/ TWL LRG LVL3 (GOWN DISPOSABLE) ×2 IMPLANT
GOWN STRL REUS W/ TWL XL LVL3 (GOWN DISPOSABLE) ×2 IMPLANT
GOWN STRL REUS W/TWL LRG LVL3 (GOWN DISPOSABLE) ×2
GOWN STRL REUS W/TWL XL LVL3 (GOWN DISPOSABLE) ×2
HANDPIECE INTERPULSE COAX TIP (DISPOSABLE) ×1
HOOD PEEL AWAY FACE SHEILD DIS (HOOD) ×6 IMPLANT
IMMOBILIZER KNEE 22 UNIV (SOFTGOODS) ×2 IMPLANT
INSERT TIB TC3 RP SZ5 15 (Knees) ×2 IMPLANT
KIT BASIN OR (CUSTOM PROCEDURE TRAY) ×2 IMPLANT
KIT ROOM TURNOVER OR (KITS) ×2 IMPLANT
MANIFOLD NEPTUNE II (INSTRUMENTS) ×2 IMPLANT
NEEDLE SPNL 18GX3.5 QUINCKE PK (NEEDLE) ×2 IMPLANT
NS IRRIG 1000ML POUR BTL (IV SOLUTION) ×2 IMPLANT
PACK TOTAL JOINT (CUSTOM PROCEDURE TRAY) ×2 IMPLANT
PACK UNIVERSAL I (CUSTOM PROCEDURE TRAY) IMPLANT
PAD ARMBOARD 7.5X6 YLW CONV (MISCELLANEOUS) ×2 IMPLANT
PAD CAST 4YDX4 CTTN HI CHSV (CAST SUPPLIES) ×1 IMPLANT
PADDING CAST COTTON 4X4 STRL (CAST SUPPLIES) ×1
PADDING CAST COTTON 6X4 STRL (CAST SUPPLIES) ×2 IMPLANT
PATELLA DOME PFC 41MM (Knees) ×2 IMPLANT
RASP HELIOCORDIAL MED (MISCELLANEOUS) IMPLANT
SET HNDPC FAN SPRY TIP SCT (DISPOSABLE) ×1 IMPLANT
SPONGE LAP 18X18 X RAY DECT (DISPOSABLE) ×2 IMPLANT
STAPLER VISISTAT 35W (STAPLE) ×2 IMPLANT
STEM  REV  115X14 (Stem) ×1 IMPLANT
STEM REV 115X14 (Stem) ×1 IMPLANT
STEM UNIV REV 115 X 6 FLUTED (Stem) ×2 IMPLANT
SUCTION FRAZIER HANDLE 10FR (MISCELLANEOUS) ×1
SUCTION TUBE FRAZIER 10FR DISP (MISCELLANEOUS) ×1 IMPLANT
SUT VIC AB 0 CT1 27 (SUTURE) ×1
SUT VIC AB 0 CT1 27XBRD ANBCTR (SUTURE) ×1 IMPLANT
SUT VIC AB 1 CTX 27 (SUTURE) ×2 IMPLANT
SUT VIC AB 1 CTX 36 (SUTURE) ×1
SUT VIC AB 1 CTX36XBRD ANBCTR (SUTURE) ×1 IMPLANT
SUT VIC AB 2-0 CT1 27 (SUTURE) ×1
SUT VIC AB 2-0 CT1 TAPERPNT 27 (SUTURE) ×1 IMPLANT
SUT VIC AB 3-0 CT1 27 (SUTURE) ×1
SUT VIC AB 3-0 CT1 TAPERPNT 27 (SUTURE) ×1 IMPLANT
SUT VIC AB 3-0 PS2 18 (SUTURE) ×2 IMPLANT
SWAB COLLECTION DEVICE MRSA (MISCELLANEOUS) ×2 IMPLANT
SWAB CULTURE ESWAB REG 1ML (MISCELLANEOUS) ×2 IMPLANT
SYR 50ML LL SCALE MARK (SYRINGE) ×2 IMPLANT
TOWEL OR 17X24 6PK STRL BLUE (TOWEL DISPOSABLE) ×2 IMPLANT
TOWEL OR 17X26 10 PK STRL BLUE (TOWEL DISPOSABLE) ×2 IMPLANT
TRAY FOLEY CATH 14FR (SET/KITS/TRAYS/PACK) IMPLANT
TRAY TIB SZ 5 REVISION (Knees) ×2 IMPLANT
TUBE ANAEROBIC SPECIMEN COL (MISCELLANEOUS) IMPLANT
WATER STERILE IRR 1000ML POUR (IV SOLUTION) ×6 IMPLANT

## 2016-02-08 NOTE — Discharge Instructions (Addendum)
INSTRUCTIONS AFTER JOINT REPLACEMENT  ° °o Remove items at home which could result in a fall. This includes throw rugs or furniture in walking pathways °o ICE to the affected joint every three hours while awake for 30 minutes at a time, for at least the first 3-5 days, and then as needed for pain and swelling.  Continue to use ice for pain and swelling. You may notice swelling that will progress down to the foot and ankle.  This is normal after surgery.  Elevate your leg when you are not up walking on it.   °o Continue to use the breathing machine you got in the hospital (incentive spirometer) which will help keep your temperature down.  It is common for your temperature to cycle up and down following surgery, especially at night when you are not up moving around and exerting yourself.  The breathing machine keeps your lungs expanded and your temperature down. ° ° °DIET:  As you were doing prior to hospitalization, we recommend a well-balanced diet. ° °DRESSING / WOUND CARE / SHOWERING ° °Keep the surgical dressing until follow up.  The dressing is water proof, so you can shower without any extra covering.  IF THE DRESSING FALLS OFF or the wound gets wet inside, change the dressing with sterile gauze.  Please use good hand washing techniques before changing the dressing.  Do not use any lotions or creams on the incision until instructed by your surgeon.   ° °ACTIVITY ° °o Increase activity slowly as tolerated, but follow the weight bearing instructions below.   °o No driving for 6 weeks or until further direction given by your physician.  You cannot drive while taking narcotics.  °o No lifting or carrying greater than 10 lbs. until further directed by your surgeon. °o Avoid periods of inactivity such as sitting longer than an hour when not asleep. This helps prevent blood clots.  °o You may return to work once you are authorized by your doctor.  ° ° ° °WEIGHT BEARING  ° °Weight bearing as tolerated with assist  device (walker, cane, etc) as directed, use it as long as suggested by your surgeon or therapist, typically at least 4-6 weeks. ° ° °EXERCISES ° °Results after joint replacement surgery are often greatly improved when you follow the exercise, range of motion and muscle strengthening exercises prescribed by your doctor. Safety measures are also important to protect the joint from further injury. Any time any of these exercises cause you to have increased pain or swelling, decrease what you are doing until you are comfortable again and then slowly increase them. If you have problems or questions, call your caregiver or physical therapist for advice.  ° °Rehabilitation is important following a joint replacement. After just a few days of immobilization, the muscles of the leg can become weakened and shrink (atrophy).  These exercises are designed to build up the tone and strength of the thigh and leg muscles and to improve motion. Often times heat used for twenty to thirty minutes before working out will loosen up your tissues and help with improving the range of motion but do not use heat for the first two weeks following surgery (sometimes heat can increase post-operative swelling).  ° °These exercises can be done on a training (exercise) mat, on the floor, on a table or on a bed. Use whatever works the best and is most comfortable for you.    Use music or television while you are exercising so that   the exercises are a pleasant break in your day. This will make your life better with the exercises acting as a break in your routine that you can look forward to.   Perform all exercises about fifteen times, three times per day or as directed.  You should exercise both the operative leg and the other leg as well. ° °Exercises include: °  °• Quad Sets - Tighten up the muscle on the front of the thigh (Quad) and hold for 5-10 seconds.   °• Straight Leg Raises - With your knee straight (if you were given a brace, keep it on),  lift the leg to 60 degrees, hold for 3 seconds, and slowly lower the leg.  Perform this exercise against resistance later as your leg gets stronger.  °• Leg Slides: Lying on your back, slowly slide your foot toward your buttocks, bending your knee up off the floor (only go as far as is comfortable). Then slowly slide your foot back down until your leg is flat on the floor again.  °• Angel Wings: Lying on your back spread your legs to the side as far apart as you can without causing discomfort.  °• Hamstring Strength:  Lying on your back, push your heel against the floor with your leg straight by tightening up the muscles of your buttocks.  Repeat, but this time bend your knee to a comfortable angle, and push your heel against the floor.  You may put a pillow under the heel to make it more comfortable if necessary.  ° °A rehabilitation program following joint replacement surgery can speed recovery and prevent re-injury in the future due to weakened muscles. Contact your doctor or a physical therapist for more information on knee rehabilitation.  ° ° °CONSTIPATION ° °Constipation is defined medically as fewer than three stools per week and severe constipation as less than one stool per week.  Even if you have a regular bowel pattern at home, your normal regimen is likely to be disrupted due to multiple reasons following surgery.  Combination of anesthesia, postoperative narcotics, change in appetite and fluid intake all can affect your bowels.  ° °YOU MUST use at least one of the following options; they are listed in order of increasing strength to get the job done.  They are all available over the counter, and you may need to use some, POSSIBLY even all of these options:   ° °Drink plenty of fluids (prune juice may be helpful) and high fiber foods °Colace 100 mg by mouth twice a day  °Senokot for constipation as directed and as needed Dulcolax (bisacodyl), take with full glass of water  °Miralax (polyethylene glycol)  once or twice a day as needed. ° °If you have tried all these things and are unable to have a bowel movement in the first 3-4 days after surgery call either your surgeon or your primary doctor.   ° °If you experience loose stools or diarrhea, hold the medications until you stool forms back up.  If your symptoms do not get better within 1 week or if they get worse, check with your doctor.  If you experience "the worst abdominal pain ever" or develop nausea or vomiting, please contact the office immediately for further recommendations for treatment. ° ° °ITCHING:  If you experience itching with your medications, try taking only a single pain pill, or even half a pain pill at a time.  You can also use Benadryl over the counter for itching or also to   help with sleep.   TED HOSE STOCKINGS:  Use stockings on both legs until for at least 2 weeks or as directed by physician office. They may be removed at night for sleeping.  MEDICATIONS:  See your medication summary on the After Visit Summary that nursing will review with you.  You may have some home medications which will be placed on hold until you complete the course of blood thinner medication.  It is important for you to complete the blood thinner medication as prescribed.  PRECAUTIONS:  If you experience chest pain or shortness of breath - call 911 immediately for transfer to the hospital emergency department.   If you develop a fever greater that 101 F, purulent drainage from wound, increased redness or drainage from wound, foul odor from the wound/dressing, or calf pain - CONTACT YOUR SURGEON.                                                   FOLLOW-UP APPOINTMENTS:  If you do not already have a post-op appointment, please call the office for an appointment to be seen by your surgeon.  Guidelines for how soon to be seen are listed in your After Visit Summary, but are typically between 1-4 weeks after surgery.  OTHER INSTRUCTIONS:   Knee  Replacement:  Do not place pillow under knee, focus on keeping the knee straight while resting. CPM instructions: 0-90 degrees, 2 hours in the morning, 2 hours in the afternoon, and 2 hours in the evening. Place foam block, curve side up under heel at all times except when in CPM or when walking.  DO NOT modify, tear, cut, or change the foam block in any way.  MAKE SURE YOU:   Understand these instructions.   Get help right away if you are not doing well or get worse.    Thank you for letting us be a part of your medical care team.  It is a privilege we respect greatly.  We hope these instructions will help you stay on track for a fast and full recovery!   Information on my medicine - ELIQUIS (apixaban)  This medication education was reviewed with me or my healthcare representative as part of my discharge preparation.  The pharmacist that spoke with me during my hospital stay was:  Brain Hilts, [facility]  Why was Eliquis prescribed for you? Eliquis was prescribed for you to reduce the risk of forming blood clots that can cause a stroke if you have a medical condition called atrial fibrillation (a type of irregular heartbeat) OR to reduce the risk of a blood clots forming after orthopedic surgery.  What do You need to know about Eliquis ? Take your Eliquis TWICE DAILY - one tablet in the morning and one tablet in the evening with or without food.  It would be best to take the doses about the same time each day.  If you have difficulty swallowing the tablet whole please discuss with your pharmacist how to take the medication safely.  Take Eliquis exactly as prescribed by your doctor and DO NOT stop taking Eliquis without talking to the doctor who prescribed the medication.  Stopping may increase your risk of developing a new clot or stroke.  Refill your prescription before you run out.  After discharge, you should have regular check-up appointments with your  healthcare provider that  is prescribing your Eliquis.  In the future your dose may need to be changed if your kidney function or weight changes by a significant amount or as you get older.  What do you do if you miss a dose? If you miss a dose, take it as soon as you remember on the same day and resume taking twice daily.  Do not take more than one dose of ELIQUIS at the same time.  Important Safety Information A possible side effect of Eliquis is bleeding. You should call your healthcare provider right away if you experience any of the following: ? Bleeding from an injury or your nose that does not stop. ? Unusual colored urine (red or dark brown) or unusual colored stools (red or black). ? Unusual bruising for unknown reasons. ? A serious fall or if you hit your head (even if there is no bleeding).  Some medicines may interact with Eliquis and might increase your risk of bleeding or clotting while on Eliquis. To help avoid this, consult your healthcare provider or pharmacist prior to using any new prescription or non-prescription medications, including herbals, vitamins, non-steroidal anti-inflammatory drugs (NSAIDs) and supplements.  This website has more information on Eliquis (apixaban): [email or web address].

## 2016-02-08 NOTE — Op Note (Addendum)
PATIENT ID:      Richard [name]  MRN:     [id number] DOB/AGE:    05/12/1960 / 56 y.o.       OPERATIVE REPORT    DATE OF PROCEDURE:  02/08/2016       PREOPERATIVE DIAGNOSIS:   S/P INFECTED LEFT TOTAL KNEE ARTHROPLASTY      Estimated body mass index is 40.81 kg/m as calculated from the following:   Height as of 01/31/16: 5\' 9"  (1.753 m).   Weight as of 01/31/16: 125.4 kg (276 lb 6 oz).                                                        POSTOPERATIVE DIAGNOSIS:   S/P INFECTED LEFT TOTAL KNEE ARTHROPLASTY                                                                      PROCEDURE:  Procedure(s): TOTAL KNEE REVISION Using Depuy Revision implants #5 left TC 3 with 165mm by 33mm stem Femur, #5 MBT with 115 mm x 14 mm stem Tibia, 79mm TC 3 RP bearing, 41 Patella     SURGEON: Prima Rayner J    ASSISTANT:   Eric K. Barton Dubois  (present throughout entire procedure and necessary for timely completion of the procedure)   ANESTHESIA: GET with Femoral Nerve Block  DRAINS: None  TOURNIQUET TIME: 20 min   COMPLICATIONS:  None     SPECIMENS: Synovial tissue for Gram stain and culture   INDICATIONS FOR PROCEDURE: The patient is status post removal of infected total knee implants with placement of an antibiotic impregnated polymethylmethacrylate spacer a little over 3 months ago. He has had 6 weeks of IV antibiotics, 6 weeks of by mouth antibiotics and has been completely off antibiotics now for 2 weeks. Attempted aspirate of the p.m. and a spacer has yielded negative results, CBC sedimentation rate and CRP are all normal. Patient desires desires reimplantation of total knee arthroplasty probably with either highly constrained components or a hinged based on the condition of the ligaments at surgery. He understands there is at least a 15% chance of recurrence of the infection and accepts the risks. All questions have been encouraged and answered.   DESCRIPTION OF PROCEDURE: The patient identified by  armband, received  right femoral nerve block and IV antibiotics, in the holding area at [facility]. Patient taken to the operating room, appropriate anesthetic  monitors were attached General endotracheal anesthesia induced with  the patient in supine position, Foley catheter was inserted. Tourniquet  applied high to the operative thigh. Lateral post and foot positioner  applied to the table, the lower extremity was then prepped and draped  in usual sterile fashion from the ankle to the tourniquet. Time-out procedure was performed. In order to facilitate a thorough debridement we began the operation without using the tourniquet. We began the operation by making the anterior midline incision starting at handbreadth above the patella going over the patella 1 cm medial to and  6 cm distal to the tibial tubercle. Small  bleeders in the skin and the  subcutaneous tissue identified and cauterized. Transverse retinaculum was incised and reflected medially and a medial parapatellar arthrotomy was accomplished. Joint fluid was noted to be clear, a piece of tissue was sent for Gram stain and culture and came back negative for any organisms. Overall the synovium did not appear to be inflamed. the patella was everted and prepatellar scar tissue was excised with the electrocautery. The superficial medial collateral  ligament was then elevated from anterior to posterior along the proximal  flare of the tibia. Using an osteotome and curettes the PMMA spacer was removed without difficulty piecemeal. The knee was flexed and further scar tissue was removed to allow motion and anterior translation of the tibia beneath the femur. We continued to  work our way around posteriorly along the proximal tibia, and externally  rotated the tibia subluxing it out from underneath the femur. A McHale  retractor was placed through the notch and a lateral Hohmann retractor  placed, and we then drilled through the proximal tibia  in line with the  axis of the tibia followed by an intramedullary guide rod and 4-degree  posterior slope cutting guide, to allow the use of stemmed implants. The tibial cutting guide was pinned into place  allowing resection of a small amount of bone to freshen up the tibial cut. Satisfied with the tibial resection, we then  reammed the tibia up to 54mm, for a 115 mm stem. This was followed by the conical reamer. We then sized for a 5 MBT tibial base plate and applied the delta fin keel punch with the 14 mm x 115extensions.  We then entered the distal femur in line with the intramedullary canal with the pilot reamer and then reamed up to 17 mm for a 115 stem mm. This was pinned along the  epicondylar axis. At this point, the distal femoral cut was accomplished without difficulty. We then sized for a 5L TC3 femoral component and pinned the guide along the epicondylar axis.The chamfer cutting guide was pinned into place. The anterior, posterior, and chamfer cuts were accomplished without difficulty followed by  the TC 3 box cutting guide and the box cut. We also removed posterior osteophytes from the posterior femoral condyles. At this  time, the knee was brought into full extension. We checked our  extension and flexion gaps and found them symmetric at 62mm.  The patella was then everted with towel clips and a brush cut accomplished removing small peripheral osteophytes behind a patellar thickness of about 13 mm. This was sized for41 button and reach redrilled. The knee was then once again hyperflexed exposing the proximal tibia and at the trial #5 tibial base plate with a D34-534 x 624THL stem was hammered into place. We then hammered into place the TC 3 RP trial femoral component, with a 83mm x 115 trial stem, 80mm anterior. We then inserted a 15-mm trial bearing, trial patellar button, and took the knee through range of motion from 0-130 degrees. a lateral release was performed to help the patella center better  as she took it for range of motion. The limb was wrapped with an Esmarch bandage and the tourniquet inflated to 350 mmHg with a trial components in place. At this point, all trial components were removed, a double batch of DePuy HV cement with 1500 mg of Zinacef, and 1200 mg of tobramycin powder was mixed and applied to all bony metallic mating surfaces except for the posterior condyles of the  femur itself. In order, we  hammered into place the tibial tray and stem and removed excess cement, the femoral component with stem and removed excess cement, a 15 mm TC 3 RP bearing  was inserted, and the knee brought to full extension with compression.  The patellar button was clamped into place, and excess cement  removed. While the cement cured the wound was irrigated out with normal saline solution pulse lavage, and medium Hemovac drains were placed from an anterolateral  approach. Ligament stability and patellar tracking were checked and found to be excellent. The parapatellar arthrotomy was closed with  running #1 Vicryl suture. The subcutaneous tissue with 0 and 2-0 undyed  Vicryl suture, and the skin with skin staples. A dressing of Xeroform,  4 x 4, dressing sponges, Webril, and Ace wrap applied. The patient  awakened, extubated, and taken to recovery room without difficulty.  Kerin Salen    Ted Goodner J 02/08/2016, 1:19 PM

## 2016-02-08 NOTE — Anesthesia Preprocedure Evaluation (Signed)
Anesthesia Evaluation  Patient identified by MRN, date of birth, ID band Patient awake    Reviewed: Allergy & Precautions, NPO status , Patient's Chart, lab work & pertinent test results  History of Anesthesia Complications Negative for: history of anesthetic complications  Airway Mallampati: II  TM Distance: >3 FB Neck ROM: Full    Dental  (+) Teeth Intact, Dental Advisory Given   Pulmonary sleep apnea ,    breath sounds clear to auscultation       Cardiovascular hypertension, + dysrhythmias  Rhythm:Regular Rate:Normal     Neuro/Psych    GI/Hepatic GERD  ,  Endo/Other  Morbid obesity  Renal/GU      Musculoskeletal  (+) Arthritis ,   Abdominal (+) + obese,   Peds  Hematology   Anesthesia Other Findings   Reproductive/Obstetrics                             Anesthesia Physical Anesthesia Plan  ASA: III  Anesthesia Plan: General   Post-op Pain Management:    Induction: Intravenous  Airway Management Planned: Oral ETT  Additional Equipment:   Intra-op Plan:   Post-operative Plan: Extubation in OR  Informed Consent: I have reviewed the patients History and Physical, chart, labs and discussed the procedure including the risks, benefits and alternatives for the proposed anesthesia with the patient or authorized representative who has indicated his/her understanding and acceptance.   Dental advisory given  Plan Discussed with:   Anesthesia Plan Comments:         Anesthesia Quick Evaluation

## 2016-02-08 NOTE — Anesthesia Postprocedure Evaluation (Addendum)
Anesthesia Post Note  Patient: Richard [name]  Procedure(s) Performed: Procedure(s) (LRB): TOTAL KNEE REVISION (Left)  Patient location during evaluation: PACU Anesthesia Type: General Level of consciousness: awake and alert Pain management: pain level controlled (still c some pain) Vital Signs Assessment: post-procedure vital signs reviewed and stable Respiratory status: spontaneous breathing, nonlabored ventilation, respiratory function stable and patient connected to nasal cannula oxygen Cardiovascular status: blood pressure returned to baseline and stable Postop Assessment: no signs of nausea or vomiting Anesthetic complications: no       Last Vitals:  Vitals:   02/08/16 1508 02/08/16 1535  BP:  (!) 106/54  Pulse:  64  Resp:  18  Temp: 36.2 C 36.4 C    Last Pain:  Vitals:   02/08/16 1535  TempSrc: Oral  PainSc:                  Fowler Antos,JAMES TERRILL

## 2016-02-08 NOTE — Anesthesia Procedure Notes (Signed)
Procedure Name: Intubation Date/Time: 02/08/2016 10:40 AM Performed by: Greggory Stallion, Zaim L Pre-anesthesia Checklist: Patient identified, Emergency Drugs available, Suction available and Patient being monitored Patient Re-evaluated:Patient Re-evaluated prior to inductionOxygen Delivery Method: Circle System Utilized Preoxygenation: Pre-oxygenation with 100% oxygen Intubation Type: IV induction Ventilation: Mask ventilation without difficulty Laryngoscope Size: Mac and 4 Grade View: Grade II Tube type: Oral Tube size: 8.0 mm Number of attempts: 1 Airway Equipment and Method: Stylet and Oral airway Placement Confirmation: ETT inserted through vocal cords under direct vision,  positive ETCO2 and breath sounds checked- equal and bilateral Secured at: 22 cm Tube secured with: Tape Dental Injury: Teeth and Oropharynx as per pre-operative assessment

## 2016-02-08 NOTE — Transfer of Care (Signed)
Immediate Anesthesia Transfer of Care Note  Patient: Richard [name]  Procedure(s) Performed: Procedure(s): TOTAL KNEE REVISION (Left)  Patient Location: PACU  Anesthesia Type:General and GA combined with regional for post-op pain  Level of Consciousness: awake, alert , oriented, patient cooperative and responds to stimulation  Airway & Oxygen Therapy: Patient Spontanous Breathing and Patient connected to nasal cannula oxygen  Post-op Assessment: Report given to RN, Post -op Vital signs reviewed and stable and Patient moving all extremities  Post vital signs: Reviewed and stable  Last Vitals:  Vitals:   02/08/16 0818 02/08/16 0950  BP: 126/69 (!) 126/57  Pulse: (!) 56 (!) 59  Resp: 20   Temp: 36.8 C     Last Pain:  Vitals:   02/08/16 0818  TempSrc: Oral         Complications: No apparent anesthesia complications

## 2016-02-08 NOTE — Progress Notes (Signed)
Orthopedic Tech Progress Note Patient Details:  Richard [name] 02/26/1960 [id number]  Ortho Devices Type of Ortho Device: Other (comment) Ortho Device/Splint Location: bone foam Ortho Device/Splint Interventions: Renard Matter 02/08/2016, 8:26 PM

## 2016-02-08 NOTE — Interval H&P Note (Signed)
History and Physical Interval Note:  02/08/2016 10:08 AM  Richard [name]  has presented today for surgery, with the diagnosis of S/P INFECTED LEFT TOTAL KNEE ARTHROPLASTY  The various methods of treatment have been discussed with the patient and family. After consideration of risks, benefits and other options for treatment, the patient has consented to  Procedure(s): TOTAL KNEE REVISION (Left) as a surgical intervention .  The patient's history has been reviewed, patient examined, no change in status, stable for surgery.  I have reviewed the patient's chart and labs.  Questions were answered to the patient's satisfaction.     Kerin Salen

## 2016-02-08 NOTE — Anesthesia Procedure Notes (Addendum)
Anesthesia Regional Block:  Adductor canal block  Pre-Anesthetic Checklist: ,, timeout performed, Correct Patient, Correct Site, Correct Laterality, Correct Procedure, Correct Position, site marked, Risks and benefits discussed,  Surgical consent,  Pre-op evaluation,  At surgeon's request and post-op pain management  Laterality: Left and Lower  Prep: chloraprep       Needles:      Needle Length: 9cm 9 cm Needle Gauge: 21 and 21 G  Needle insertion depth: 8 cm   Additional Needles:  Procedures: ultrasound guided (picture in chart) Adductor canal block Narrative:  Start time: 02/08/2016 9:30 AM End time: 02/08/2016 9:51 AM Injection made incrementally with aspirations every 5 mL.  Performed by: Personally  Anesthesiologist: Addison Whidbee

## 2016-02-08 NOTE — Progress Notes (Signed)
[facility]  Error-Wrong pt record.

## 2016-02-09 ENCOUNTER — Encounter (HOSPITAL_COMMUNITY): Payer: Self-pay | Admitting: Orthopedic Surgery

## 2016-02-09 LAB — BASIC METABOLIC PANEL
Anion gap: 7 (ref 5–15)
BUN: 18 mg/dL (ref 6–20)
CO2: 28 mmol/L (ref 22–32)
Calcium: 8.6 mg/dL — ABNORMAL LOW (ref 8.9–10.3)
Chloride: 101 mmol/L (ref 101–111)
Creatinine, Ser: 1.29 mg/dL — ABNORMAL HIGH (ref 0.61–1.24)
GFR calc Af Amer: >60 mL/min (ref >60)
GFR calc non Af Amer: >60 mL/min (ref >60)
Glucose, Bld: 145 mg/dL — ABNORMAL HIGH (ref 65–99)
Potassium: 4.4 mmol/L (ref 3.5–5.1)
Sodium: 136 mmol/L (ref 135–145)

## 2016-02-09 LAB — CBC
HCT: 26.8 % — ABNORMAL LOW (ref 39.0–52.0)
Hemoglobin: 8 g/dL — ABNORMAL LOW (ref 13.0–17.0)
MCH: 23.4 pg — ABNORMAL LOW (ref 26.0–34.0)
MCHC: 29.9 g/dL — ABNORMAL LOW (ref 30.0–36.0)
MCV: 78.4 fL (ref 78.0–100.0)
Platelets: 165 10*3/uL (ref 150–400)
RBC: 3.42 MIL/uL — ABNORMAL LOW (ref 4.22–5.81)
RDW: 16.9 % — ABNORMAL HIGH (ref 11.5–15.5)
WBC: 10.9 10*3/uL — ABNORMAL HIGH (ref 4.0–10.5)

## 2016-02-09 MED ORDER — ALPRAZOLAM 0.5 MG PO TABS
0.5000 mg | ORAL_TABLET | Freq: Two times a day (BID) | ORAL | Status: DC | PRN
  Administered 2016-02-09: 0.5 mg via ORAL
  Filled 2016-02-09: qty 1

## 2016-02-09 NOTE — Evaluation (Signed)
Occupational Therapy Evaluation Patient Details Name: Richard [name] MRN: [id number] DOB: 06/28/1960 Today's Date: 02/09/2016    History of Present Illness 56 y.o. male admitted to [facility] on 02/08/16 for L TKA revision (complications from infection).  He has significant PMHx of R TKA, multilple L knee surgeries after TKA due to infection, hypertensive heart disease, obesity, HA, HTN, DVT, chornic systolic CHF, bil PE, and IVC filter placement.    Clinical Impression   PTA Pt independent in ADL and used crutches for mobility. Pt currently max A for ADL and transfers, with min A for mobility with RW. Please see performance level below. Pt will benefit from skilled OT in the acute care setting prior to dc to maximize independence and safety in ADL and decrease caregiver burden. Next session to focus on AE for LB bathing/dressing and energy conservation in addition to functional transfer education.     Follow Up Recommendations  No OT follow up;Supervision/Assistance - 24 hour (initially)    Equipment Recommendations  None recommended by OT    Recommendations for Other Services       Precautions / Restrictions Precautions Precautions: Knee Precaution Booklet Issued: Yes (comment) Precaution Comments: provided during PT evaluation Required Braces or Orthoses: Knee Immobilizer - Left Knee Immobilizer - Left: On when out of bed or walking Restrictions Weight Bearing Restrictions: Yes LLE Weight Bearing: Weight bearing as tolerated Other Position/Activity Restrictions: per pt report, MD does not want him to bend his knee right now, but wants him to keep it straight.  PT called MD office and left a message for MD/PA to clarify what they want.       Mobility Bed Mobility Overal bed mobility: Needs Assistance Bed Mobility: Sit to Supine     Supine to sit: Min assist     General bed mobility comments: Min assist to help pt lift his left leg into the bed.   Transfers Overall  transfer level: Needs assistance Equipment used: Rolling walker (2 wheeled) Transfers: Sit to/from Stand Sit to Stand: Mod assist;+2 physical assistance         General transfer comment: Two person mod assist from low recliner chair. Verbal cues for safe hand placement.   One person min assist from higher bed to pull up shorts later.  Assist provided at trunk to power up.     Balance Overall balance assessment: Needs assistance Sitting-balance support: Feet supported;Bilateral upper extremity supported Sitting balance-Leahy Scale: Fair Sitting balance - Comments: Able to sit EOB with no back support for lower body dressing   Standing balance support: Bilateral upper extremity supported Standing balance-Leahy Scale: Poor Standing balance comment: reliant on RW                            ADL Overall ADL's : Needs assistance/impaired Eating/Feeding: Modified independent;Sitting   Grooming: Set up;Sitting   Upper Body Bathing: Moderate assistance;Sitting   Lower Body Bathing: Maximal assistance;Sitting/lateral leans   Upper Body Dressing : Set up;Sitting   Lower Body Dressing: Maximal assistance;Sit to/from stand;+2 for safety/equipment Lower Body Dressing Details (indicate cue type and reason): to don underwear and shorts Toilet Transfer: Moderate assistance;+2 for safety/equipment;Ambulation;BSC;RW Toilet Transfer Details (indicate cue type and reason): simulated with recliner and transfers from sitting EOB         Functional mobility during ADLs: Minimal assistance;Rolling walker;Cueing for sequencing General ADL Comments: Pt has had knee surgeries in the past, so familiar with compensatory strategies.  Father (caregiver) is also a great caregiver and helper     Vision Vision Assessment?: No apparent visual deficits   Perception     Praxis      Pertinent Vitals/Pain Pain Assessment: 0-10 Pain Score: 9  Pain Location: left knee Pain Descriptors /  Indicators: Aching;Burning Pain Intervention(s): Limited activity within patient's tolerance;Monitored during session;Repositioned;Ice applied     Hand Dominance Right   Extremity/Trunk Assessment Upper Extremity Assessment Upper Extremity Assessment: Overall WFL for tasks assessed   Lower Extremity Assessment Lower Extremity Assessment: LLE deficits/detail;Defer to PT evaluation LLE Deficits / Details: left leg with normal post op pain and weakness.   Cervical / Trunk Assessment Cervical / Trunk Assessment: Normal   Communication Communication Communication: No difficulties   Cognition Arousal/Alertness: Awake/alert Behavior During Therapy: WFL for tasks assessed/performed Overall Cognitive Status: Within Functional Limits for tasks assessed                     General Comments       Exercises      Shoulder Instructions      Home Living Family/patient expects to be discharged to:: Private residence Living Arrangements: Alone Available Help at Discharge: Family;Available 24 hours/day Type of Home: House (Plans on dc to father's home) Home Access: Stairs to enter [employer] of Steps: 3 Entrance Stairs-Rails: Right;Left;Can reach both Home Layout: One level     Bathroom Shower/Tub: Tub/shower unit;Other (comment) (has a walk in shower at his own home) Shower/tub characteristics: Curtain Biochemist, clinical: Standard Bathroom Accessibility: Yes How Accessible: Accessible via walker Home Equipment: [place] - 2 wheels;Cane - single point;Bedside commode;Wheelchair - manual;Crutches          Prior Functioning/Environment Level of Independence: Independent with assistive device(s)        Comments: used crutches        OT Problem List: Decreased strength;Decreased activity tolerance;Impaired balance (sitting and/or standing);Obesity;Pain   OT Treatment/Interventions: Self-care/ADL training;Energy conservation;Therapeutic activities;Balance  training;Patient/family education;Therapeutic exercise    OT Goals(Current goals can be found in the care plan section) Acute Rehab OT Goals Patient Stated Goal: for this to be his last surgery. OT Goal Formulation: With patient Time For Goal Achievement: 02/16/16 Potential to Achieve Goals: Good ADL Goals Pt Will Perform Lower Body Bathing: with modified independence;with adaptive equipment;sitting/lateral leans Pt Will Perform Lower Body Dressing: with min assist;with caregiver independent in assisting;sit to/from stand Pt Will Transfer to Toilet: with supervision;ambulating (BSC over toilet with RW) Pt Will Perform Toileting - Clothing Manipulation and hygiene: with modified independence;sit to/from stand Additional ADL Goal #1: Pt will recall 3 ways to conserve energy during ADL  OT Frequency: Min 2X/week   Barriers to D/C:    none       Co-evaluation PT/OT/SLP Co-Evaluation/Treatment: Yes Reason for Co-Treatment: For patient/therapist safety;To address functional/ADL transfers PT goals addressed during session: Mobility/safety with mobility OT goals addressed during session: ADL's and self-care      End of Session Equipment Utilized During Treatment: Gait belt;Rolling walker;Left knee immobilizer Nurse Communication: Mobility status;Weight bearing status  Activity Tolerance: Patient tolerated treatment well Patient left: in bed;with call bell/phone within reach;with family/visitor present   Time: [id number] OT Time Calculation (min): 27 min Charges:  OT General Charges $OT Visit: 1 Procedure OT Treatments $Self Care/Home Management : 8-22 mins G-Codes:    Merri Ray Yasmen Cortner 02/18/2016, 4:35 PM  Hulda Humphrey OTR/L [phone]

## 2016-02-09 NOTE — Progress Notes (Addendum)
PATIENT ID: Richard [name]  MRN: [id number]  DOB/AGE:  08/29/1960 / 56 y.o.  1 Day Post-Op Procedure(s) (LRB): TOTAL KNEE REVISION (Left)    PROGRESS NOTE Subjective: Patient is alert, oriented, no Nausea, no Vomiting, yes passing gas. Taking PO well with small bites and sips. Denies SOB, Chest or Calf Pain. Using Incentive Spirometer, PAS in place. Ambulate WBAT with knee immobilizer, Patient reports pain as 6-7/10 .    Objective: Vital signs in last 24 hours: Vitals:   02/08/16 1535 02/08/16 2124 02/09/16 0125 02/09/16 0518  BP: (!) 106/54 (!) 101/47 105/62 124/71  Pulse: 64 67 70 73  Resp: 18 16 16 16   Temp: 97.6 F (36.4 C) 98 F (36.7 C) 98 F (36.7 C) 98.9 F (37.2 C)  TempSrc: Oral Oral Oral Oral  SpO2: 100% 97% 98% 100%      Intake/Output from previous day: I/O last 3 completed shifts: In: 2900 [I.V.:2900] Out: 850 [Urine:550; Blood:300]   Intake/Output this shift: No intake/output data recorded.   LABORATORY DATA:  Recent Labs  02/08/16 0810 02/09/16 0435  WBC  --  10.9*  HGB  --  8.0*  HCT  --  26.8*  PLT  --  165  NA  --  136  K  --  4.4  CL  --  101  CO2  --  28  BUN  --  18  CREATININE  --  1.29*  GLUCOSE  --  145*  INR 1.05  --   CALCIUM  --  8.6*    Examination: Neurologically intact Neurovascular intact Sensation intact distally Intact pulses distally Dorsiflexion/Plantar flexion intact Incision: dressing C/D/I and scant drainage No cellulitis present Compartment soft}  Assessment:   1 Day Post-Op Procedure(s) (LRB): TOTAL KNEE REVISION (Left) ADDITIONAL DIAGNOSIS: Expected Acute Blood Loss Anemia, Hypertension, Sleep Apnea and Hx of PE and DVT  Plan: PT/OT WBAT, AROM and PROM  DVT Prophylaxis:  SCDx72hrs, Eliquis 2.5 mg BID x 2 weeks DISCHARGE PLAN: Home, when pt passes therapy goals, likely Saturday vs. Sunday.  Medications in chart. DISCHARGE NEEDS: HHPT, Walker and 3-in-1 comode seat     Richard [name] R 02/09/2016, 8:36  AM

## 2016-02-09 NOTE — Evaluation (Signed)
Physical Therapy Evaluation Patient Details Name: Richard [name] MRN: [id number] DOB: 1960/11/25 Today's Date: 02/09/2016   History of Present Illness  56 y.o. male admitted to [facility] on 02/08/16 for L TKA revision (complications from infection).  He has significant PMHx of R TKA, multilple L knee surgeries after TKA due to infection, hypertensive heart disease, obesity, HA, HTN, DVT, chornic systolic CHF, bil PE, and IVC filter placement.   Clinical Impression  Pt is [name] and is moving well, limited by pain, but once up on his feet, min assist with the RW.  His father is very involved and helpful and he will be going home to his father's home and supervision/assist at discharge.   PT to follow acutely for deficits listed below.  PT also called MD office and left a message re: pt reporting that MD/PA told him not to bend his knee, but to keep it straight.  PT awaiting clarification and did only exercises with his leg straight today until clarification given.  PA's note from today say AROM and PROM, but pt swears he was told not to bend it, so I am seeking clarification before starting knee flexion exercises.      Follow Up Recommendations Home health PT;Supervision for mobility/OOB    Equipment Recommendations  None recommended by PT    Recommendations for Other Services   NA    Precautions / Restrictions Precautions Precautions: Knee Precaution Booklet Issued: Yes (comment) Precaution Comments: knee exercise handout given Required Braces or Orthoses: Knee Immobilizer - Left Knee Immobilizer - Left: On when out of bed or walking Restrictions LLE Weight Bearing: Weight bearing as tolerated Other Position/Activity Restrictions: per pt report, MD does not want him to bend his knee right now, but wants him to keep it straight.        Mobility  Bed Mobility Overal bed mobility: Needs Assistance Bed Mobility: Supine to Sit     Supine to sit: HOB elevated;Mod assist     General bed  mobility comments: Mod assist to provide hand to pull to sitting from elevated HOB.  Assist to bring left leg over EOB.   Transfers Overall transfer level: Needs assistance Equipment used: Rolling walker (2 wheeled) Transfers: Sit to/from Stand Sit to Stand: Min assist;From elevated surface         General transfer comment: Min assist from maximally elevated bed.    Ambulation/Gait Ambulation/Gait assistance: Min assist Ambulation Distance (Feet): 5 Feet Assistive device: Rolling walker (2 wheeled) Gait Pattern/deviations: Step-to pattern;Antalgic     General Gait Details: moderately antalgic gait pattern, verbal cues for LE sequencing.          Balance Overall balance assessment: Needs assistance Sitting-balance support: Feet supported;Bilateral upper extremity supported Sitting balance-Leahy Scale: Fair     Standing balance support: Bilateral upper extremity supported Standing balance-Leahy Scale: Poor                               Pertinent Vitals/Pain Pain Assessment: 0-10 Pain Score: 9  Pain Location: left knee Pain Descriptors / Indicators: Aching;Burning Pain Intervention(s): Limited activity within patient's tolerance;Monitored during session;Repositioned    Home Living Family/patient expects to be discharged to:: Private residence Living Arrangements: Alone Available Help at Discharge: Family;Available 24 hours/day Type of Home: House (Dad's house) Home Access: Stairs to enter Entrance Stairs-Rails: Right;Left;Can reach both Entrance Stairs-Number of Steps: 3 Home Layout: One level Home Equipment: Walker - 2 wheels;Cane -  single point;Bedside commode;Wheelchair - manual;Crutches      Prior Function Level of Independence: Independent with assistive device(s)         Comments: used crutches     Hand Dominance   Dominant Hand: Right    Extremity/Trunk Assessment   Upper Extremity Assessment Upper Extremity Assessment: Defer to  OT evaluation    Lower Extremity Assessment Lower Extremity Assessment: LLE deficits/detail LLE Deficits / Details: left leg with normal post op pain and weakness, ankle 3/5, knee NT due to immobilization, hip flexion 2/5    Cervical / Trunk Assessment Cervical / Trunk Assessment: Normal  Communication   Communication: No difficulties  Cognition Arousal/Alertness: Awake/alert Behavior During Therapy: WFL for tasks assessed/performed Overall Cognitive Status: Within Functional Limits for tasks assessed                         Exercises Total Joint Exercises Ankle Circles/Pumps: AROM;Both;20 reps Quad Sets: AROM;Both;10 reps Towel Squeeze: AROM;Both;10 reps Hip ABduction/ADduction: AAROM;Left;10 reps Straight Leg Raises: AAROM;Left;10 reps   Assessment/Plan    PT Assessment Patient needs continued PT services  PT Problem List Decreased strength;Decreased range of motion;Decreased activity tolerance;Decreased balance;Decreased mobility;Decreased knowledge of use of DME;Obesity;Pain;Decreased knowledge of precautions          PT Treatment Interventions DME instruction;Functional mobility training;Gait training;Stair training;Therapeutic activities;Therapeutic exercise;Balance training;Patient/family education;Manual techniques;Modalities    PT Goals (Current goals can be found in the Care Plan section)  Acute Rehab PT Goals Patient Stated Goal: for this to be his last surgery. PT Goal Formulation: With patient/family Time For Goal Achievement: 02/16/16 Potential to Achieve Goals: Good    Frequency 7X/week           End of Session Equipment Utilized During Treatment: Gait belt;Left knee immobilizer Activity Tolerance: Patient limited by pain;Patient limited by fatigue Patient left: in chair;with call bell/phone within reach           Time: 1343-1412 PT Time Calculation (min) (ACUTE ONLY): 29 min   Charges:   PT Evaluation $PT Eval Moderate  Complexity: 1 Procedure PT Treatments $Therapeutic Activity: 8-22 mins        Peytan Andringa B. McNeal, [place], DPT [phone]   02/09/2016, 2:35 PM

## 2016-02-09 NOTE — Progress Notes (Signed)
02/09/16 1558  PT Visit Information  Last PT Received On 02/09/16  Assistance Needed +1 (may need +2 to get out of low recliner chair)  PT/OT/SLP Co-Evaluation/Treatment Yes  Reason for Co-Treatment For patient/therapist safety  PT goals addressed during session Mobility/safety with mobility;Balance;Proper use of DME  History of Present Illness 56 y.o. male admitted to [facility] on 02/08/16 for L TKA revision (complications from infection).  He has significant PMHx of R TKA, multilple L knee surgeries after TKA due to infection, hypertensive heart disease, obesity, HA, HTN, DVT, chornic systolic CHF, bil PE, and IVC filter placement.   Subjective Data  Subjective Pt reports the recliner chair is making his back hurt.   Patient Stated Goal for this to be his last surgery.  Precautions  Precautions Knee  Precaution Booklet Issued Yes (comment)  Precaution Comments knee exercise handout given  Required Braces or Orthoses Knee Immobilizer - Left  Knee Immobilizer - Left On when out of bed or walking  Restrictions  LLE Weight Bearing WBAT  Other Position/Activity Restrictions per pt report, MD does not want him to bend his knee right now, but wants him to keep it straight.  PT called MD office and left a message for MD/PA to clarify what they want.   Pain Assessment  Pain Assessment 0-10  Pain Score 9  Pain Location left knee  Pain Descriptors / Indicators Aching;Burning  Pain Intervention(s) Limited activity within patient's tolerance;Monitored during session;Repositioned;Ice applied  Cognition  Arousal/Alertness Awake/alert  Behavior During Therapy WFL for tasks assessed/performed  Overall Cognitive Status Within Functional Limits for tasks assessed  Bed Mobility  Overal bed mobility Needs Assistance  Bed Mobility Sit to Supine  Supine to sit Min assist  General bed mobility comments Min assist to help pt lift his left leg into the bed.   Transfers  Overall transfer level Needs  assistance  Equipment used Rolling walker (2 wheeled)  Transfers Sit to/from Stand  Sit to Stand Mod assist;+2 physical assistance  General transfer comment Two person mod assist from low recliner chair. Verbal cues for safe hand placement.   One person min assist from higher bed to pull up shorts later.  Assist provided at trunk to power up.   Ambulation/Gait  Ambulation/Gait assistance Min assist;+2 safety/equipment  Ambulation Distance (Feet) 25 Feet  Assistive device Rolling walker (2 wheeled)  Gait Pattern/deviations Step-to pattern;Antalgic  General Gait Details moderately antalgic gait pattern, verbal cues for LE sequencing. Second person for line management and safety.  Pt limited by pain and mild lightheadedness during gait.   Gait velocity decreased  Gait velocity interpretation Below normal speed for age/gender  Balance  Overall balance assessment Needs assistance  Sitting-balance support Feet supported;Bilateral upper extremity supported  Sitting balance-Leahy Scale Fair  Standing balance support Bilateral upper extremity supported  Standing balance-Leahy Scale Poor  PT - End of Session  Equipment Utilized During Treatment Gait belt;Left knee immobilizer  Activity Tolerance Patient limited by pain;Patient limited by fatigue  Patient left in bed;with call bell/phone within reach;with family/visitor present  PT - Assessment/Plan  PT Plan Current plan remains appropriate  Follow Up Recommendations Home health PT;Supervision for mobility/OOB  PT equipment None recommended by PT  PT Time Calculation  PT Start Time (ACUTE ONLY) 1520  PT Stop Time (ACUTE ONLY) 1547  PT Time Calculation (min) (ACUTE ONLY) 27 min  PT General Charges  $$ ACUTE PT VISIT 1 Procedure  PT Treatments  $Gait Training 8-22 mins  02/09/2016 Pt  is progressing well with his mobility.  He is min assist once up on his feet, however, it did take two people to get him up from his low recliner chair.  His father  is very hands on and helpful.  PT will continue to follow acutely until d/c.  Wells Guiles B. [place], [place], DPT [phone]

## 2016-02-10 LAB — CBC
HCT: 26 % — ABNORMAL LOW (ref 39.0–52.0)
Hemoglobin: 7.8 g/dL — ABNORMAL LOW (ref 13.0–17.0)
MCH: 23.2 pg — ABNORMAL LOW (ref 26.0–34.0)
MCHC: 30 g/dL (ref 30.0–36.0)
MCV: 77.4 fL — ABNORMAL LOW (ref 78.0–100.0)
Platelets: 171 10*3/uL (ref 150–400)
RBC: 3.36 MIL/uL — ABNORMAL LOW (ref 4.22–5.81)
RDW: 16.9 % — ABNORMAL HIGH (ref 11.5–15.5)
WBC: 13 10*3/uL — ABNORMAL HIGH (ref 4.0–10.5)

## 2016-02-10 NOTE — Progress Notes (Signed)
PATIENT ID: AYREN STALNAKER  MRN: [id number]  DOB/AGE:  08/04/1960 / 56 y.o.  2 Days Post-Op Procedure(s) (LRB): TOTAL KNEE REVISION (Left)    PROGRESS NOTE Subjective: Patient is alert, oriented, no Nausea, no Vomiting, yes passing gas, No BM. Taking PO well with small bites and sips. Denies SOB, Chest or Calf Pain. Using Incentive Spirometer, PAS in place. Ambulate WBAT with knee immobilizer, Patient reports pain as 6-7/10 .    Objective: Vital signs in last 24 hours: Vitals:   02/09/16 2016 02/09/16 2100 02/10/16 0430 02/10/16 0908  BP: (!) 145/58  130/76 130/76  Pulse: 85 80 91 91  Resp: 18 16 18    Temp: 98.6 F (37 C)  98.2 F (36.8 C)   TempSrc: Oral  Oral   SpO2: 97% 96% 100%       Intake/Output from previous day: I/O last 3 completed shifts: In: 1120 [P.O.:720; I.V.:400] Out: 1650 [Urine:1650]   Intake/Output this shift: No intake/output data recorded.   LABORATORY DATA:  Recent Labs  02/08/16 0810 02/09/16 0435 02/10/16 0400  WBC  --  10.9* 13.0*  HGB  --  8.0* 7.8*  HCT  --  26.8* 26.0*  PLT  --  165 171  NA  --  136  --   K  --  4.4  --   CL  --  101  --   CO2  --  28  --   BUN  --  18  --   CREATININE  --  1.29*  --   GLUCOSE  --  145*  --   INR 1.05  --   --   CALCIUM  --  8.6*  --     Examination: Neurologically intact Neurovascular intact Sensation intact distally Intact pulses distally Dorsiflexion/Plantar flexion intact Incision: dressing C/D/I and scant drainage No cellulitis present Compartment soft}  Assessment:   2 Days Post-Op Procedure(s) (LRB): TOTAL KNEE REVISION (Left) ADDITIONAL DIAGNOSIS: Expected Acute Blood Loss Anemia, Hypertension, Sleep Apnea and Hx of PE and DVT  Plan: PT/OT WBAT, AROM and PROM--May do AROM, but only gentle PROM--No aggressive Passive flexion yet please DVT Prophylaxis:  SCDx72hrs, Eliquis 2.5 mg BID x 2 weeks DISCHARGE PLAN: Home, when pt passes therapy goals, likely Sunday.  Medications in  chart. DISCHARGE NEEDS: HHPT, Walker and 3-in-1 comode seat     Delsie Amador A. 02/10/2016, 9:46 AM

## 2016-02-10 NOTE — Progress Notes (Signed)
Occupational Therapy Treatment Patient Details Name: Richard [name] MRN: [id number] DOB: 07/17/1960 Today's Date: 02/10/2016    History of present illness 56 y.o. male admitted to [facility] on 02/08/16 for L TKA revision (complications from infection).  He has significant PMHx of R TKA, multilple L knee surgeries after TKA due to infection, hypertensive heart disease, obesity, HA, HTN, DVT, chornic systolic CHF, bil PE, and IVC filter placement.    OT comments  Pt. Making gains with skilled OT.  Very knowledgeable of ADL techniques from previous surgeries.  Father is primary caregiver and able to assist with LB ADLS as needed.  Clear for d/c from OT.  OTR/L to sign off.    Follow Up Recommendations  No OT follow up;Supervision/Assistance - 24 hour    Equipment Recommendations  None recommended by OT    Recommendations for Other Services      Precautions / Restrictions Precautions Precautions: Knee Precaution Booklet Issued: Yes (comment) Precaution Comments: knee exercise handout given Required Braces or Orthoses: Knee Immobilizer - Left Knee Immobilizer - Left: On when out of bed or walking Restrictions Weight Bearing Restrictions: Yes LLE Weight Bearing: Weight bearing as tolerated Other Position/Activity Restrictions: per pt report, MD does not want him to bend his knee right now, but wants him to keep it straight.  PT called MD office and left a message for MD/PA to clarify what they want.        Mobility Bed Mobility Overal bed mobility: Needs Assistance Bed Mobility: Supine to Sit     Supine to sit: Min assist     General bed mobility comments: pt. seated in recliner at beginning and end of session  Transfers Overall transfer level: Needs assistance Equipment used: Rolling walker (2 wheeled) Transfers: Sit to/from [facility] Sit to Stand: Min guard Stand pivot transfers: Min guard       General transfer comment: cues for hand placement and to push  through B UES and not hold his breath-no physical assist for sit/stand    Balance Overall balance assessment: Needs assistance Sitting-balance support: Feet supported;Bilateral upper extremity supported Sitting balance-Leahy Scale: Good     Standing balance support: Bilateral upper extremity supported Standing balance-Leahy Scale: Poor                     ADL Overall ADL's : Needs assistance/impaired     Grooming: Wash/dry hands;Standing;Supervision/safety         Lower Body Bathing Details (indicate cue type and reason): reports his father assists at home       Lower Body Dressing Details (indicate cue type and reason): reports his father assists at home Toilet Transfer: Min guard;RW;Grab bars;Ambulation Toilet Transfer Details (indicate cue type and reason): amb. to b.room and stood to urinate Toileting- Water quality scientist and Hygiene: Supervision/safety;Sit to/from stand       Functional mobility during ADLs: Min guard;Rolling walker General ADL Comments: Pt has had knee surgeries in the past, so familiar with compensatory strategies. Father (caregiver) is also a great caregiver and helper      Vision                     Perception     Praxis      Cognition   Behavior During Therapy: WFL for tasks assessed/performed Overall Cognitive Status: Within Functional Limits for tasks assessed  Extremity/Trunk Assessment               Exercises Total Joint Exercises Ankle Circles/Pumps: AROM;Both;20 reps Quad Sets: AROM;Both;10 reps Towel Squeeze: AROM;Both;10 reps Hip ABduction/ADduction: AAROM;Left;10 reps Straight Leg Raises: AAROM;Left;10 reps   Shoulder Instructions       General Comments      Pertinent Vitals/ Pain       Pain Assessment: 0-10 Pain Score: 7  Faces Pain Scale: Hurts even more Pain Location: L knee Pain Descriptors / Indicators: Aching Pain Intervention(s): Limited activity  within patient's tolerance;Monitored during session;Premedicated before session;Repositioned;Ice applied  Home Living                                          Prior Functioning/Environment              Frequency  Min 2X/week        Progress Toward Goals  OT Goals(current goals can now be found in the care plan section)  Progress towards OT goals: Goals met/education completed, patient discharged from OT  Acute Rehab OT Goals Patient Stated Goal: for this to be his last surgery.  Plan Discharge plan remains appropriate    Co-evaluation                 End of Session Equipment Utilized During Treatment: Gait belt;Rolling walker;Left knee immobilizer   Activity Tolerance Patient tolerated treatment well   Patient Left in chair;with call bell/phone within reach;with family/visitor present   Nurse Communication          Time: 3354-5625 OT Time Calculation (min): 19 min  Charges: OT General Charges $OT Visit: 1 Procedure OT Treatments $Self Care/Home Management : 8-22 mins  Janice Coffin, COTA/L 02/10/2016, 11:02 AM

## 2016-02-10 NOTE — Progress Notes (Signed)
Physical Therapy Treatment Patient Details Name: Richard [name] MRN: [id number] DOB: 09-04-1960 Today's Date: 02/10/2016    History of Present Illness 56 y.o. male admitted to [facility] on 02/08/16 for L TKA revision (complications from infection).  He has significant PMHx of R TKA, multilple L knee surgeries after TKA due to infection, hypertensive heart disease, obesity, HA, HTN, DVT, chornic systolic CHF, bil PE, and IVC filter placement.     PT Comments    Patient is making progress toward mobility goals and tolerated increased gait distance this session. Performed therex with knee extended only this am and waiting for clarification on ROM before knee flexion. Continue to progress as tolerated with anticipated d/c home with HHPT.   Follow Up Recommendations  Home health PT;Supervision for mobility/OOB     Equipment Recommendations  None recommended by PT    Recommendations for Other Services       Precautions / Restrictions Precautions Precautions: Knee Precaution Booklet Issued: Yes (comment) Precaution Comments: knee exercise handout given Required Braces or Orthoses: Knee Immobilizer - Left Knee Immobilizer - Left: On when out of bed or walking Restrictions Weight Bearing Restrictions: Yes LLE Weight Bearing: Weight bearing as tolerated Other Position/Activity Restrictions: per pt report, MD does not want him to bend his knee right now, but wants him to keep it straight.  PT called MD office and left a message for MD/PA to clarify what they want.     Mobility  Bed Mobility Overal bed mobility: Needs Assistance Bed Mobility: Supine to Sit     Supine to sit: Min assist     General bed mobility comments: assist to bring L LE to EOB and to lower from EOB due to pain  Transfers Overall transfer level: Needs assistance Equipment used: Rolling walker (2 wheeled) Transfers: Sit to/from Stand Sit to Stand: Min assist         General transfer comment: assist to power up  into standing; cues for hand placement  Ambulation/Gait Ambulation/Gait assistance: Min guard Ambulation Distance (Feet): 45 Feet Assistive device: Rolling walker (2 wheeled) Gait Pattern/deviations: Step-to pattern;Antalgic;Decreased stance time - right;Decreased step length - left;Decreased weight shift to right Gait velocity: decreased   General Gait Details: cues for posture and sequencing; pt began to perform step through gait pattern with increased distance   Stairs            Wheelchair Mobility    Modified Rankin (Stroke Patients Only)       Balance Overall balance assessment: Needs assistance Sitting-balance support: Feet supported;Bilateral upper extremity supported Sitting balance-Leahy Scale: Good     Standing balance support: Bilateral upper extremity supported Standing balance-Leahy Scale: Poor                      Cognition Arousal/Alertness: Awake/alert Behavior During Therapy: WFL for tasks assessed/performed Overall Cognitive Status: Within Functional Limits for tasks assessed                      Exercises Total Joint Exercises Ankle Circles/Pumps: AROM;Both;20 reps Quad Sets: AROM;Both;10 reps Towel Squeeze: AROM;Both;10 reps Hip ABduction/ADduction: AAROM;Left;10 reps Straight Leg Raises: AAROM;Left;10 reps    General Comments        Pertinent Vitals/Pain Pain Assessment: Faces Faces Pain Scale: Hurts even more Pain Location: left knee Pain Descriptors / Indicators: Aching;Burning Pain Intervention(s): Limited activity within patient's tolerance;Monitored during session;Repositioned;RN gave pain meds during session;Ice applied    Home Living  Prior Function            PT Goals (current goals can now be found in the care plan section) Acute Rehab PT Goals Patient Stated Goal: for this to be his last surgery. Progress towards PT goals: Progressing toward goals    Frequency     7X/week      PT Plan Current plan remains appropriate    Co-evaluation             End of Session Equipment Utilized During Treatment: Gait belt;Left knee immobilizer Activity Tolerance: Patient tolerated treatment well Patient left: with call bell/phone within reach;in chair     Time: [id number] PT Time Calculation (min) (ACUTE ONLY): 40 min  Charges:  $Gait Training: 8-22 mins $Therapeutic Exercise: 8-22 mins $Therapeutic Activity: 8-22 mins                    G Codes:      [place] April, PTA Pager: [phone]   02/10/2016, 9:37 AM

## 2016-02-10 NOTE — Care Management Note (Signed)
Case Management Note  Patient Details  Name: Richard [name] MRN: [id number] Date of Birth: 07/08/1960  Subjective/Objective: 56 yr old male s/p left total  Knee revision.                   Action/Plan: Case manager spoke with patient concerning [name] and DME needs. Patient has [name], therefore [name] will arrange [name]. CM called referral to [name] 02/10/16, referral given to [name], Intake ID #  [id number]. CM will fax pertinent information and orders to [name]  [phone]. CM also requested that if they encounter difficulty arranging Home Health the patient's MD needs to be notified. Patient has RW and 3in1 and will have family support at discharge.    Expected Discharge Date:    02/10/16              Expected Discharge Plan:  [name]  In-House Referral:  NA  Discharge planning Services  CM Consult  Post Acute Care Choice:  Home Health Choice offered to:  Patient  DME Arranged:   (patient has DME from prior surgery) DME Agency:  NA  HH Arranged:  PT (Home Health to be arranged through [name]  for [name]) [name] Agency:  Other - See comment (to be arranged by [name] for [name])  Status of Service:  In process, will continue to follow  If discussed at Long Length of Stay Meetings, dates discussed:    Additional Comments:  Ninfa Meeker, RN 02/10/2016, 10:39 AM

## 2016-02-10 NOTE — Progress Notes (Signed)
Reviewed discharge paperwork and medications with wife and Mr Maxamilian with full understanding, d/c IV., clean dry and intact

## 2016-02-10 NOTE — Progress Notes (Signed)
Physical Therapy Treatment Patient Details Name: Richard [name] MRN: [id number] DOB: 01/29/1960 Today's Date: 02/10/2016    History of Present Illness 56 y.o. male admitted to [facility] on 02/08/16 for L TKA revision (complications from infection).  He has significant PMHx of R TKA, multilple L knee surgeries after TKA due to infection, hypertensive heart disease, obesity, HA, HTN, DVT, chornic systolic CHF, bil PE, and IVC filter placement.     PT Comments    Patient tolerated short distance gait and therex this session. Pt continues to need help with lifting L LE during bed mobility/transfers but reported he will have a lot of help at home. Educated pt to perform AROM only and reviewed HEP. Current plan remains appropriate.   Follow Up Recommendations  Home health PT;Supervision for mobility/OOB     Equipment Recommendations  None recommended by PT    Recommendations for Other Services       Precautions / Restrictions Precautions Precautions: Knee Precaution Booklet Issued: Yes (comment) Precaution Comments: knee exercise handout given Required Braces or Orthoses: Knee Immobilizer - Left Knee Immobilizer - Left: On when out of bed or walking Restrictions Weight Bearing Restrictions: Yes LLE Weight Bearing: Weight bearing as tolerated Other Position/Activity Restrictions: AROM only; no aggressive PROM    Mobility  Bed Mobility Overal bed mobility: Needs Assistance Bed Mobility: Supine to Sit     Supine to sit: Min assist     General bed mobility comments: assist to bring L LE to EOB and to lower from EOB  Transfers Overall transfer level: Needs assistance Equipment used: Rolling walker (2 wheeled) Transfers: Sit to/from Stand Sit to Stand: Min guard Stand pivot transfers: Min guard       General transfer comment: cues for hand placement  Ambulation/Gait Ambulation/Gait assistance: Min guard Ambulation Distance (Feet): 20 Feet Assistive device: Rolling walker (2  wheeled) Gait Pattern/deviations: Step-to pattern;Antalgic;Decreased stance time - right;Decreased step length - left;Decreased weight shift to right Gait velocity: decreased   General Gait Details: cues for posture and sequencing; limited by bilat UE fatigue; pt relies heavily on RW   Stairs Stairs:  (pt declined stair training )          Wheelchair Mobility    Modified Rankin (Stroke Patients Only)       Balance Overall balance assessment: Needs assistance Sitting-balance support: Feet supported;Bilateral upper extremity supported Sitting balance-Leahy Scale: Good     Standing balance support: Bilateral upper extremity supported Standing balance-Leahy Scale: Poor                      Cognition Arousal/Alertness: Awake/alert Behavior During Therapy: WFL for tasks assessed/performed Overall Cognitive Status: Within Functional Limits for tasks assessed                      Exercises Total Joint Exercises Ankle Circles/Pumps: AROM;Both;20 reps Towel Squeeze: AROM;Both;10 reps Heel Slides: AROM;Right;10 reps Knee Flexion: AROM;Right;5 reps;Seated    General Comments General comments (skin integrity, edema, etc.): pt educated to only perform AROM       Pertinent Vitals/Pain Pain Assessment: Faces Pain Score: 7  Faces Pain Scale: Hurts little more Pain Location: left knee Pain Descriptors / Indicators: Aching Pain Intervention(s): Limited activity within patient's tolerance;Monitored during session;Premedicated before session;Repositioned    Home Living                      Prior Function  PT Goals (current goals can now be found in the care plan section) Acute Rehab PT Goals Patient Stated Goal: for this to be his last surgery. Progress towards PT goals: Progressing toward goals    Frequency    7X/week      PT Plan Current plan remains appropriate    Co-evaluation             End of Session Equipment  Utilized During Treatment: Gait belt;Left knee immobilizer Activity Tolerance: Patient tolerated treatment well Patient left: with call bell/phone within reach;in chair;Other (comment) (L LE in zero degree foam)     Time: [id number] PT Time Calculation (min) (ACUTE ONLY): 40 min  Charges:  $Gait Training: 8-22 mins $Therapeutic Exercise: 8-22 mins $Therapeutic Activity: 8-22 mins                    G Codes:      [place] April, PTA Pager: [phone]   02/10/2016, 2:48 PM

## 2016-02-10 NOTE — Plan of Care (Signed)
Problem: Acute Rehab OT Goals (only OT should resolve) Goal: Pt. Will Perform Lower Body Bathing Outcome: Not Applicable Date Met: 61/60/76 Pt. Reports his father helps him with LB ADLS.  Goal: Pt. Will Perform Lower Body Dressing Outcome: Not Applicable Date Met: 06/67/85 Pt. Reports father helps with LB ADLS

## 2016-02-12 NOTE — Progress Notes (Signed)
02/12/2016 MD's office called to clarify pt's request for no knee ROM after pt d/c from the hospital.  MD would like gentle active ROM, no forceful passive ROM.  Thanks,  Barbarann Ehlers. [place], [place], DPT [phone]

## 2016-02-13 LAB — AEROBIC/ANAEROBIC CULTURE (SURGICAL/DEEP WOUND)

## 2016-02-13 LAB — AEROBIC/ANAEROBIC CULTURE W GRAM STAIN (SURGICAL/DEEP WOUND): Culture: NO GROWTH

## 2016-02-16 ENCOUNTER — Encounter: Payer: Self-pay | Admitting: Internal Medicine

## 2016-02-16 ENCOUNTER — Ambulatory Visit (INDEPENDENT_AMBULATORY_CARE_PROVIDER_SITE_OTHER): Payer: Managed Care, Other (non HMO) | Admitting: Internal Medicine

## 2016-02-16 VITALS — BP 138/70 | HR 84 | Temp 98.4°F | Wt 271.8 lb

## 2016-02-16 DIAGNOSIS — G44201 Tension-type headache, unspecified, intractable: Secondary | ICD-10-CM | POA: Diagnosis not present

## 2016-02-16 MED ORDER — AMITRIPTYLINE HCL 25 MG PO TABS: 25.0000 mg | ORAL_TABLET | Freq: Every evening | ORAL | 0 refills | Status: DC | PRN

## 2016-02-16 NOTE — Progress Notes (Signed)
Subjective:    Patient ID: Richard [name], male    DOB: 11/02/1960, 56 y.o.   MRN: [id number]  HPI  Pt presents to the clinic today with c/o headaches. This started 1 week ago, after he had knee surgery. The headache is located in his temples. He describes the pain as pressure. He has noticed some blurred vision and dizziness but denies sensitivity to light or sound, nausea or vomiting.  He has not had a head injury. He is taking Oxycodone 1-2 times day. He does not take the Tramadol. He takes Tylenol once daily. He takes the Baclofen daily. He can not take Flexeril and Methocarbamol.   He was seen 12/7 for a tension headache. He was advised to take Tylenol 1000 mg every 8 hours, hold Baclofen and use Flexeril. He did not get any improvement with the Tylenol and Baclofen. He was then advised to try 2 Percocet, which he reports did not help at all.  Review of Systems  Past Medical History:  Diagnosis Date  . Arthritis   . Arthrofibrosis of total knee arthroplasty ([place]) 05/30/2015  . Bilateral pulmonary embolism ([place])    a. 03/2015 CTA: acute bilat PE.  Marland Kitchen Chest pain    a. 2015 Abnl MV;  b. 2015 Cath: nl cors.  . Chronic diastolic CHF (congestive heart failure) ([place])    a. 03/2015 Echo: EF 55-65%, poor windows, mildly dil RV.  Marland Kitchen DVT (deep venous thrombosis) ([place])    a. 03/2015 U/S: occlusive DVT w/in the distal aspect of the Left fem vein through the L popliteal vein.  Marland Kitchen Dysrhythmia    PAF  . GERD (gastroesophageal reflux disease)   . Headache   . Hypertension   . Hypertensive heart disease   . Obesity   . OSA (obstructive sleep apnea)    uses CPAP nightly  . Primary localized osteoarthritis of left knee    a. 11/2014 s/p L TKA.  . Primary localized osteoarthritis of right knee    a. 02/2014 s/p R Partial Knee arthroplasty.  . Stiffness of left knee    a. 12/2014 s/p manipulation under anesthesia.  . Urine incontinence     Current Outpatient Prescriptions  Medication Sig  Dispense Refill  . acetaminophen (TYLENOL) 650 MG CR tablet Take 1,300 mg by mouth every 8 (eight) hours as needed for pain.    Marland Kitchen apixaban (ELIQUIS) 2.5 MG TABS tablet Take 1 tablet (2.5 mg total) by mouth 2 (two) times daily. 30 tablet 0  . baclofen (LIORESAL) 10 MG tablet TAKE 1-2 TABLETS (10-20 MG TOTAL) BY MOUTH 3 (THREE) TIMES DAILY AS NEEDED FOR MUSCLE SPASMS. 180 tablet 0  . bismuth subsalicylate (PEPTO-BISMOL) 262 MG chewable tablet Chew 2 tablets (524 mg total) by mouth 4 (four) times daily -  before meals and at bedtime. (Patient taking differently: Chew 524 mg by mouth 3 (three) times daily as needed for indigestion. ) 112 tablet 0  . cyclobenzaprine (FLEXERIL) 10 MG tablet Take 1 tablet (10 mg total) by mouth 3 (three) times daily as needed for muscle spasms. (Patient not taking: Reported on 02/01/2016) 20 tablet 0  . esomeprazole (NEXIUM) 40 MG capsule Take 40 mg by mouth daily as needed (for acid reflux).     . flecainide (TAMBOCOR) 50 MG tablet Take 1 tablet (50 mg total) by mouth 2 (two) times daily. 180 tablet 3  . furosemide (LASIX) 40 MG tablet TAKE 1 TABLET (40 MG TOTAL) BY MOUTH DAILY. 30 tablet 5  .  hydrOXYzine (ATARAX/VISTARIL) 25 MG tablet Take 1 tablet (25 mg total) by mouth every 6 (six) hours as needed for itching. (Patient not taking: Reported on 02/01/2016) 40 tablet 0  . linezolid (ZYVOX) 600 MG tablet Take 1 tablet (600 mg total) by mouth 2 (two) times daily. (Patient not taking: Reported on 02/01/2016) 30 tablet 0  . methocarbamol (ROBAXIN) 500 MG tablet Take 1 tablet (500 mg total) by mouth 2 (two) times daily with a meal. 60 tablet 0  . metoprolol succinate (TOPROL-XL) 25 MG 24 hr tablet Take 1 tablet (25 mg total) by mouth 2 (two) times daily. Take with or immediately following a meal. 90 tablet 3  . oxyCODONE-acetaminophen (ROXICET) 5-325 MG tablet Take 1 tablet by mouth every 4 (four) hours as needed. 30 tablet 0  . traMADol (ULTRAM) 50 MG tablet Take 1 tablet (50 mg  total) by mouth every 6 (six) hours as needed for moderate pain. 30 tablet 0   No current facility-administered medications for this visit.     Allergies  Allergen Reactions  . Vancomycin Other (See Comments)    Red Mans' syndrome  . Penicillins Other (See Comments)    Received ancef 3gms with no obvious reaction. 08/08/15 Has patient had a PCN reaction causing immediate rash, facial/tongue/throat swelling, SOB or lightheadedness with hypotension: Unknown, childhood reaction Has patient had a PCN reaction causing severe rash involving mucus membranes or skin necrosis: No Has patient had a PCN reaction that required hospitalization No Has patient had a PCN reaction occurring within the last 10 years: No If all of the above answers are "NO", then may proceed with Ce    Family History  Problem Relation Age of Onset  . Coronary artery disease Mother   . Hyperlipidemia Mother   . Hypertension Mother   . Arthritis Mother   . Coronary artery disease Father   . Heart disease Paternal Grandmother   . Alzheimer's disease Paternal Grandfather     Social History   Social History  . Marital status: Married    Spouse name: N/A  . Number of children: N/A  . 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   Social History Main Topics  . Smoking status: Never Smoker  . Smokeless tobacco: Never Used  . Alcohol use Yes     Comment: occassional  . Drug use: No  . Sexual activity: Yes   Other Topics Concern  . Not on file   Social History Narrative  . No narrative on file     Constitutional: Pt reports headache. Denies fever, malaise, fatigue, or abrupt weight changes.  HEENT: Pt reports blurred vision. Denies eye pain, eye redness, ear pain, ringing in the ears, wax buildup, runny nose, nasal congestion, bloody nose, or sore throat. Neurological: Pt reports dizziness. Denies difficulty with memory, difficulty with speech or problems with balance and  coordination.    No other specific complaints in a complete review of systems (except as listed in HPI above).     Objective:   Physical Exam  BP 138/70   Pulse 84   Temp 98.4 F (36.9 C) (Oral)   Wt 271 lb 12 oz (123.3 kg)   SpO2 98%   BMI 40.13 kg/m  Wt Readings from Last 3 Encounters:  02/16/16 271 lb 12 oz (123.3 kg)  01/31/16 276 lb 6 oz (125.4 kg)  12/28/15 272 lb (123.4 kg)    General: Appears his stated age, obese in NAD. HEENT:  Eyes: sclera white, no icterus, conjunctiva pink, PERRLA and EOMs intact;  Musculoskeletal: Normal flexion, extension, rotation and lateral bending of the cervical spine. No bony tenderness noted.  Neurological: Alert and oriented. Coordination normal.    BMET    Component Value Date/Time   NA 136 02/09/2016 0435   K 4.4 02/09/2016 0435   CL 101 02/09/2016 0435   CO2 28 02/09/2016 0435   GLUCOSE 145 (H) 02/09/2016 0435   BUN 18 02/09/2016 0435   CREATININE 1.29 (H) 02/09/2016 0435   CREATININE 1.27 12/06/2015 1600   CALCIUM 8.6 (L) 02/09/2016 0435   GFRNONAA >60 02/09/2016 0435   GFRAA >60 02/09/2016 0435    Lipid Panel     Component Value Date/Time   CHOL 124 03/29/2015 0434   TRIG 71 03/29/2015 0434   HDL 30 (L) 03/29/2015 0434   CHOLHDL 4.1 03/29/2015 0434   VLDL 14 03/29/2015 0434   LDLCALC 80 03/29/2015 0434    CBC    Component Value Date/Time   WBC 13.0 (H) 02/10/2016 0400   RBC 3.36 (L) 02/10/2016 0400   HGB 7.8 (L) 02/10/2016 0400   HCT 26.0 (L) 02/10/2016 0400   PLT 171 02/10/2016 0400   MCV 77.4 (L) 02/10/2016 0400   MCH 23.2 (L) 02/10/2016 0400   MCHC 30.0 02/10/2016 0400   RDW 16.9 (H) 02/10/2016 0400   LYMPHSABS 2.1 01/31/2016 1325   MONOABS 0.5 01/31/2016 1325   EOSABS 0.5 01/31/2016 1325   BASOSABS 0.1 01/31/2016 1325    Hgb A1C Lab Results  Component Value Date   HGBA1C 5.5 06/22/2015            Assessment & Plan:   Headache:  I still think this is tension/muscular There may be  a component of medication overuse- advised him not to take pain meds if he doesn't need it He can't take NSAIDs Did not tolerate Flexeril or Robaxin Continue Baclofen eRx for Amitriptyline 25 mg tabs, QHS prn- sedation caution given If no improvement, will send to neurology for further eval  RTC as needed ir if symptoms persist or worsen Brailee Riede, NP

## 2016-02-16 NOTE — Patient Instructions (Signed)
General Headache Without Cause A headache is pain or discomfort felt around the head or neck area. The specific cause of a headache may not be found. There are many causes and types of headaches. A few common ones are:  Tension headaches.  Migraine headaches.  Cluster headaches.  Chronic daily headaches.  Follow these instructions at home: Watch your condition for any changes. Take these steps to help with your condition: Managing pain  Take over-the-counter and prescription medicines only as told by your health care provider.  Lie down in a dark, quiet room when you have a headache.  If directed, apply ice to the head and neck area: ? Put ice in a plastic bag. ? Place a towel between your skin and the bag. ? Leave the ice on for 20 minutes, 2-3 times per day.  Use a heating pad or hot shower to apply heat to the head and neck area as told by your health care provider.  Keep lights dim if bright lights bother you or make your headaches worse. Eating and drinking  Eat meals on a regular schedule.  Limit alcohol use.  Decrease the amount of caffeine you drink, or stop drinking caffeine. General instructions  Keep all follow-up visits as told by your health care provider. This is important.  Keep a headache journal to help find out what may trigger your headaches. For example, write down: ? What you eat and drink. ? How much sleep you get. ? Any change to your diet or medicines.  Try massage or other relaxation techniques.  Limit stress.  Sit up straight, and do not tense your muscles.  Do not use tobacco products, including cigarettes, chewing tobacco, or e-cigarettes. If you need help quitting, ask your health care provider.  Exercise regularly as told by your health care provider.  Sleep on a regular schedule. Get 7-9 hours of sleep, or the amount recommended by your health care provider. Contact a health care provider if:  Your symptoms are not helped by  medicine.  You have a headache that is different from the usual headache.  You have nausea or you vomit.  You have a fever. Get help right away if:  Your headache becomes severe.  You have repeated vomiting.  You have a stiff neck.  You have a loss of vision.  You have problems with speech.  You have pain in the eye or ear.  You have muscular weakness or loss of muscle control.  You lose your balance or have trouble walking.  You feel faint or pass out.  You have confusion. This information is not intended to replace advice given to you by your health care provider. Make sure you discuss any questions you have with your health care provider. Document Released: 01/07/2005 Document Revised: 06/15/2015 Document Reviewed: 05/02/2014 Elsevier Interactive Patient Education  2017 Elsevier Inc.  

## 2016-02-19 ENCOUNTER — Telehealth: Payer: Self-pay

## 2016-02-19 DIAGNOSIS — R51 Headache: Principal | ICD-10-CM

## 2016-02-19 DIAGNOSIS — R519 Headache, unspecified: Secondary | ICD-10-CM

## 2016-02-19 NOTE — Telephone Encounter (Signed)
Referral to neurology placed.

## 2016-02-19 NOTE — Telephone Encounter (Signed)
Pt left v/m; pt was seen 02/16/16 with h/a and pt was to cb if no improvement with h/a. H/A is no better and request what to do now. In 02/16/16 office note mentioned neurology referral.Please advise.

## 2016-02-19 NOTE — Addendum Note (Signed)
Addended by: Jearld Fenton on: 02/19/2016 10:57 AM   Modules accepted: Orders

## 2016-02-19 NOTE — Telephone Encounter (Signed)
Pt is aware.  

## 2016-02-22 NOTE — Discharge Summary (Signed)
Patient ID: Richard [name] MRN: [id number] DOB/AGE: [birth year] 55 y.o.  Admit date: 02/08/2016 Discharge date: 02/22/2016  Admission Diagnoses:  Principal Problem:   Acquired absence of knee joint following explantation of joint prosthesis with presence of antibiotic-impregnated cement spacer, left Active Problems:   H/O total knee replacement, left   Discharge Diagnoses:  Same  Past Medical History:  Diagnosis Date  . Arthritis   . Arthrofibrosis of total knee arthroplasty ([place]) 05/30/2015  . Bilateral pulmonary embolism ([place])    a. 03/2015 CTA: acute bilat PE.  Marland Kitchen Chest pain    a. 2015 Abnl MV;  b. 2015 Cath: nl cors.  . Chronic diastolic CHF (congestive heart failure) ([place])    a. 03/2015 Echo: EF 55-65%, poor windows, mildly dil RV.  Marland Kitchen DVT (deep venous thrombosis) ([place])    a. 03/2015 U/S: occlusive DVT w/in the distal aspect of the Left fem vein through the L popliteal vein.  Marland Kitchen Dysrhythmia    PAF  . GERD (gastroesophageal reflux disease)   . Headache   . Hypertension   . Hypertensive heart disease   . Obesity   . OSA (obstructive sleep apnea)    uses CPAP nightly  . Primary localized osteoarthritis of left knee    a. 11/2014 s/p L TKA.  . Primary localized osteoarthritis of right knee    a. 02/2014 s/p R Partial Knee arthroplasty.  . Stiffness of left knee    a. 12/2014 s/p manipulation under anesthesia.  . Urine incontinence     Surgeries: Procedure(s): TOTAL KNEE REVISION on 02/08/2016   Consultants:   Discharged Condition: Improved  Hospital Course: Richard [name] is an 56 y.o. male who was admitted 02/08/2016 for operative treatment ofAcquired absence of knee joint following explantation of joint prosthesis with presence of antibiotic-impregnated cement spacer. Patient has severe unremitting pain that affects sleep, daily activities, and work/hobbies. After pre-op clearance the patient was taken to the operating room on 02/08/2016 and underwent  Procedure(s): TOTAL  KNEE REVISION.    Patient was given perioperative antibiotics:  Anti-infectives    Start     Dose/Rate Route Frequency Ordered Stop   02/08/16 1223  tobramycin (NEBCIN) powder  Status:  Discontinued       As needed 02/08/16 1223 02/08/16 1339   02/08/16 1222  cefUROXime (ZINACEF) injection  Status:  Discontinued       As needed 02/08/16 1222 02/08/16 1339   02/08/16 1030  ceFAZolin (ANCEF) 1 GM/50ML IVPB    Comments:  Rebekah Chesterfield   : cabinet override      02/08/16 1030 02/08/16 2244   02/08/16 1030  ceFAZolin (ANCEF) 2-4 GM/100ML-% IVPB    Comments:  Rebekah Chesterfield   : cabinet override      02/08/16 1030 02/08/16 2244   02/08/16 1015  clindamycin (CLEOCIN) IVPB 900 mg  Status:  Discontinued     900 mg 100 mL/hr over 30 Minutes Intravenous On call to O.R. 02/08/16 0758 02/08/16 1516   02/08/16 0802  clindamycin (CLEOCIN) 900 MG/50ML IVPB    Comments:  Merryl Hacker   : cabinet override      02/08/16 0802 02/08/16 2014       Patient was given sequential compression devices, early ambulation, and chemoprophylaxis to prevent DVT.  Patient benefited maximally from hospital stay and there were no complications.    Recent vital signs: No data found.    Recent laboratory studies: No results for input(s): WBC, HGB, HCT, PLT, NA, K, CL, CO2,  BUN, CREATININE, GLUCOSE, INR, CALCIUM in the last 72 hours.  Invalid input(s): PT, 2   Discharge Medications:   Allergies as of 02/10/2016      Reactions   Vancomycin Other (See Comments)   Red Mans' syndrome   Penicillins Other (See Comments)   Received ancef 3gms with no obvious reaction. 08/08/15 Has patient had a PCN reaction causing immediate rash, facial/tongue/throat swelling, SOB or lightheadedness with hypotension: Unknown, childhood reaction Has patient had a PCN reaction causing severe rash involving mucus membranes or skin necrosis: No Has patient had a PCN reaction that required hospitalization No Has patient had a PCN  reaction occurring within the last 10 years: No If all of the above answers are "NO", then may proceed with Ce      Medication List    TAKE these medications   acetaminophen 650 MG CR tablet Commonly known as:  TYLENOL Take 1,300 mg by mouth every 8 (eight) hours as needed for pain.   apixaban 2.5 MG Tabs tablet Commonly known as:  ELIQUIS Take 1 tablet (2.5 mg total) by mouth 2 (two) times daily. What changed:  medication strength  how much to take   baclofen 10 MG tablet Commonly known as:  LIORESAL TAKE 1-2 TABLETS (10-20 MG TOTAL) BY MOUTH 3 (THREE) TIMES DAILY AS NEEDED FOR MUSCLE SPASMS.   bismuth subsalicylate 99991111 MG chewable tablet Commonly known as:  PEPTO-BISMOL Chew 2 tablets (524 mg total) by mouth 4 (four) times daily -  before meals and at bedtime. What changed:  when to take this  reasons to take this   esomeprazole 40 MG capsule Commonly known as:  NEXIUM Take 40 mg by mouth daily as needed (for acid reflux).   flecainide 50 MG tablet Commonly known as:  TAMBOCOR Take 1 tablet (50 mg total) by mouth 2 (two) times daily.   furosemide 40 MG tablet Commonly known as:  LASIX TAKE 1 TABLET (40 MG TOTAL) BY MOUTH DAILY.   metoprolol succinate 25 MG 24 hr tablet Commonly known as:  TOPROL-XL Take 1 tablet (25 mg total) by mouth 2 (two) times daily. Take with or immediately following a meal.   oxyCODONE-acetaminophen 5-325 MG tablet Commonly known as:  ROXICET Take 1 tablet by mouth every 4 (four) hours as needed. What changed:  reasons to take this       Diagnostic Studies: Dg Knee 1-2 Views Left  Result Date: 02/08/2016 CLINICAL DATA:  Left knee total revision EXAM: LEFT KNEE - 1-2 VIEW COMPARISON:  08/08/2015 radiographs of the left knee FINDINGS: New constrained left knee arthroplasty without hardware failure nor postoperative fracture noted. No bone destruction is seen. Postoperative subcutaneous and intra-articular gas noted from placement of  the arthroplasty. IMPRESSION: IMPRESSION New constrained left knee arthroplasty with postoperative change. No postoperative fracture. Electronically Signed   By: Ashley Royalty M.D.   On: 02/08/2016 14:26    Disposition: 01-Home or Self Care    Follow-up Information    Kerin Salen, MD Follow up in 2 week(s).   Specialty:  Orthopedic Surgery Contact information: [place]  [id number] [phone]        [name] Follow up.   Why:  [name] will arrange [place], they will have someone contact you to arrange start date and time for therapy. If you have not heard from them within 48 hrs. please contact the number on your [name] card.  Signed: PHILLIPS, ERIC R 02/22/2016, 2:44 PM

## 2016-02-26 DIAGNOSIS — G44221 Chronic tension-type headache, intractable: Secondary | ICD-10-CM | POA: Insufficient documentation

## 2016-03-08 ENCOUNTER — Other Ambulatory Visit: Payer: Self-pay | Admitting: Internal Medicine

## 2016-03-08 NOTE — Telephone Encounter (Signed)
Please refill times one in PCP absence 

## 2016-03-08 NOTE — Telephone Encounter (Signed)
Baity pt.Marland KitchenMarland KitchenLast filled 02/06/16--please advise

## 2016-03-08 NOTE — Telephone Encounter (Signed)
done

## 2016-03-21 ENCOUNTER — Emergency Department: Payer: Managed Care, Other (non HMO)

## 2016-03-21 ENCOUNTER — Encounter: Payer: Self-pay | Admitting: Emergency Medicine

## 2016-03-21 ENCOUNTER — Emergency Department
Admission: EM | Admit: 2016-03-21 | Discharge: 2016-03-21 | Disposition: A | Discharge status: Home / Self Care | Payer: Managed Care, Other (non HMO) | Attending: Emergency Medicine | Admitting: Emergency Medicine

## 2016-03-21 DIAGNOSIS — R0789 Other chest pain: Secondary | ICD-10-CM | POA: Diagnosis present

## 2016-03-21 DIAGNOSIS — R079 Chest pain, unspecified: Secondary | ICD-10-CM

## 2016-03-21 DIAGNOSIS — I110 Hypertensive heart disease with heart failure: Secondary | ICD-10-CM | POA: Diagnosis not present

## 2016-03-21 DIAGNOSIS — Z79899 Other long term (current) drug therapy: Secondary | ICD-10-CM | POA: Diagnosis not present

## 2016-03-21 DIAGNOSIS — I5032 Chronic diastolic (congestive) heart failure: Secondary | ICD-10-CM | POA: Diagnosis not present

## 2016-03-21 LAB — BASIC METABOLIC PANEL
Anion gap: 1 — ABNORMAL LOW (ref 5–15)
BUN: 25 mg/dL — ABNORMAL HIGH (ref 6–20)
CO2: 27 mmol/L (ref 22–32)
Calcium: 9.1 mg/dL (ref 8.9–10.3)
Chloride: 112 mmol/L — ABNORMAL HIGH (ref 101–111)
Creatinine, Ser: 1.23 mg/dL (ref 0.61–1.24)
GFR calc Af Amer: >60 mL/min (ref >60)
GFR calc non Af Amer: >60 mL/min (ref >60)
Glucose, Bld: 102 mg/dL — ABNORMAL HIGH (ref 65–99)
Potassium: 4.4 mmol/L (ref 3.5–5.1)
Sodium: 140 mmol/L (ref 135–145)

## 2016-03-21 LAB — CBC
HCT: 31.6 % — ABNORMAL LOW (ref 40.0–52.0)
Hemoglobin: 9.9 g/dL — ABNORMAL LOW (ref 13.0–18.0)
MCH: 22.1 pg — ABNORMAL LOW (ref 26.0–34.0)
MCHC: 31.3 g/dL — ABNORMAL LOW (ref 32.0–36.0)
MCV: 70.6 fL — ABNORMAL LOW (ref 80.0–100.0)
Platelets: 341 10*3/uL (ref 150–440)
RBC: 4.47 MIL/uL (ref 4.40–5.90)
RDW: 17.8 % — ABNORMAL HIGH (ref 11.5–14.5)
WBC: 8.6 10*3/uL (ref 3.8–10.6)

## 2016-03-21 LAB — PROTIME-INR
INR: 1.24
Prothrombin Time: 15.7 seconds — ABNORMAL HIGH (ref 11.4–15.2)

## 2016-03-21 LAB — TROPONIN I
Troponin I: <0.03 ng/mL (ref <0.03)
Troponin I: <0.03 ng/mL (ref <0.03)

## 2016-03-21 LAB — APTT: aPTT: 31 seconds (ref 24–36)

## 2016-03-21 LAB — FIBRIN DERIVATIVES D-DIMER (ARMC ONLY): Fibrin derivatives D-dimer (ARMC): 1331.08 — ABNORMAL HIGH (ref 0–499)

## 2016-03-21 MED ORDER — ACETAMINOPHEN 500 MG PO TABS
1000.0000 mg | ORAL_TABLET | ORAL | Status: AC
  Administered 2016-03-21: 1000 mg via ORAL
  Filled 2016-03-21: qty 2

## 2016-03-21 MED ORDER — IOPAMIDOL (ISOVUE-370) INJECTION 76%
75.0000 mL | Freq: Once | INTRAVENOUS | Status: AC | PRN
  Administered 2016-03-21: 75 mL via INTRAVENOUS

## 2016-03-21 NOTE — Discharge Instructions (Signed)
You have been seen in the Emergency Department (ED) today for chest pain.  As we have discussed today?s test results are reassuring but you may require further testing.  Please follow up with the recommended doctor as instructed above in these documents regarding today?s emergent visit and your recent symptoms to discuss further management.  Continue to take your regular medications.   Return to the Emergency Department (ED) if you experience any further chest pain/pressure/tightness, difficulty breathing, or sudden sweating, or other symptoms that concern you.

## 2016-03-21 NOTE — ED Notes (Signed)
Patient transported to CT 

## 2016-03-21 NOTE — ED Triage Notes (Signed)
Pt in via POV with complaints of chest pain without radiation with some shortness of breath and light headedness.  Pt reports upper back pain x 2 days prior to chest pain.  Pt with hx of multiple PE's, pt states, "I have a filter system some where in my chest."

## 2016-03-21 NOTE — ED Provider Notes (Signed)
[facility] Emergency Department Provider Note   ____________________________________________   First MD Initiated Contact with Patient 03/21/16 1000     (approximate)  I have reviewed the triage vital signs and the nursing notes.   HISTORY  Chief Complaint Chest Pain    HPI Richard [name] is a 56 y.o. male reports he woke up about 2:00 in the morning with a sharp discomfort in his upper chest. Feels like a stabbing sharp pain, was quite severe earlier this morning, but is now subsided as a mild discomfort. It is somewhat worse with taking deep breath. No heavy pressure, no nausea, no vomiting and no sweating. Denies any history of heart problems or coronary disease. He does have a known history of pulmonary embolism, atrial fibrillation, and reports being compliant with eliquis, nd also having an IVC filter that was placed here previously.  No abdominal pain.  Past Medical History:  Diagnosis Date  . Arthritis   . Arthrofibrosis of total knee arthroplasty ([place]) 05/30/2015  . Bilateral pulmonary embolism ([place])    a. 03/2015 CTA: acute bilat PE.  Marland Kitchen Chest pain    a. 2015 Abnl MV;  b. 2015 Cath: nl cors.  . Chronic diastolic CHF (congestive heart failure) ([place])    a. 03/2015 Echo: EF 55-65%, poor windows, mildly dil RV.  Marland Kitchen DVT (deep venous thrombosis) ([place])    a. 03/2015 U/S: occlusive DVT w/in the distal aspect of the Left fem vein through the L popliteal vein.  Marland Kitchen Dysrhythmia    PAF  . GERD (gastroesophageal reflux disease)   . Headache   . Hypertension   . Hypertensive heart disease   . Obesity   . OSA (obstructive sleep apnea)    uses CPAP nightly  . Primary localized osteoarthritis of left knee    a. 11/2014 s/p L TKA.  . Primary localized osteoarthritis of right knee    a. 02/2014 s/p R Partial Knee arthroplasty.  . Stiffness of left knee    a. 12/2014 s/p manipulation under anesthesia.  . Urine incontinence     Patient Active Problem List    Diagnosis Date Noted  . H/O total knee replacement, left 02/08/2016  . Acquired absence of knee joint following explantation of joint prosthesis with presence of antibiotic-impregnated cement spacer, left 02/07/2016  . Helicobacter pylori (H. pylori) infection 12/06/2015  . Coagulase-negative staphylococcal infection   . Infection of total left knee replacement ([place]) 10/30/2015  . Infection associated with internal left knee prosthesis ([place]) 10/29/2015  . Arthrofibrosis of total knee arthroplasty ([place]) 05/30/2015  . PAF (paroxysmal atrial fibrillation) ([place]) 03/30/2015  . Peroneal DVT (deep venous thrombosis) ([place])   . Pulmonary embolism ([place]) 03/27/2015  . Hypertension 08/17/2011  . LVH (left ventricular hypertrophy) 08/17/2011  . GERD (gastroesophageal reflux disease) 08/17/2011  . Morbid obesity ([place]) 08/16/2011  . OSA (obstructive sleep apnea) 08/16/2011    Past Surgical History:  Procedure Laterality Date  . APPENDECTOMY    . CARDIAC CATHETERIZATION    . EXAM UNDER ANESTHESIA WITH MANIPULATION OF KNEE Left 05/30/2015   Procedure: EXAM UNDER ANESTHESIA WITH MANIPULATION OF LEFT KNEE;  Surgeon: Marchia Bond, MD;  Location: [place];  Service: Orthopedics;  Laterality: Left;  . I&D KNEE WITH POLY EXCHANGE Left 10/30/2015   Procedure: IRRIGATION AND DEBRIDEMENT KNEE WITH POLY EXCHANGE PLACE SPACERS;  Surgeon: Frederik Pear, MD;  Location: [place];  Service: Orthopedics;  Laterality: Left;  OFF ELIQUIST 3 DAYS  . IVC FILTER PLACEMENT [facility]  HX)  04/13/15  . JOINT REPLACEMENT    . KNEE ARTHROSCOPY Left 08/08/2015   Procedure: LEFT KNEE MANIPULATION WITH ARTHROSCOPIC LYSIS OF ADHESIONS AND ASPIRATION;  Surgeon: Marchia Bond, MD;  Location: [place];  Service: Orthopedics;  Laterality: Left;  . KNEE CLOSED REDUCTION Left 01/20/2015   Procedure: LEFT KNEE MANIPULATION;  Surgeon: Marchia Bond, MD;  Location: [place];  Service: Orthopedics;  Laterality: Left;  . LEFT HEART  CATHETERIZATION WITH CORONARY ANGIOGRAM N/A 01/12/2014   Procedure: LEFT HEART CATHETERIZATION WITH CORONARY ANGIOGRAM;  Surgeon: Jettie Booze, MD;  Location: [facility] CATH LAB;  Service: Cardiovascular;  Laterality: N/A;  . ORTHOPEDIC SURGERY Left    arthroscopy x3  . PARTIAL KNEE ARTHROPLASTY Right 02/25/2014   Procedure: RIGHT KNEE ARTHROPLASTY CONDYLE AND PLATEAU MEDIAL COMPARTMENT ;  Surgeon: Johnny Bridge, MD;  Location: [place];  Service: Orthopedics;  Laterality: Right;  . PERIPHERAL VASCULAR CATHETERIZATION N/A 03/29/2015   Procedure: IVC Filter Insertion, and possible thrombectomy;  Surgeon: Katha Cabal, MD;  Location: [place] CV LAB;  Service: Cardiovascular;  Laterality: N/A;  . TOTAL KNEE ARTHROPLASTY  12/06/2014   Procedure: TOTAL KNEE ARTHROPLASTY;  Surgeon: Marchia Bond, MD;  Location: [place];  Service: Orthopedics;;  . TOTAL KNEE REVISION Left 02/08/2016   Procedure: TOTAL KNEE REVISION;  Surgeon: Frederik Pear, MD;  Location: [place];  Service: Orthopedics;  Laterality: Left;    Prior to Admission medications   Medication Sig Start Date End Date Taking? Authorizing Provider  baclofen (LIORESAL) 10 MG tablet TAKE 1-2 TABLETS (10-20 MG TOTAL) BY MOUTH 3 (THREE) TIMES DAILY AS NEEDED FOR MUSCLE SPASMS. 03/08/16  Yes Abner Greenspan, MD  ELIQUIS 5 MG TABS tablet Take 5 tablets by mouth daily. 02/13/16  Yes Historical Provider, MD  esomeprazole (NEXIUM) 40 MG capsule Take 40 mg by mouth daily as needed (for acid reflux).    Yes Historical Provider, MD  flecainide (TAMBOCOR) 50 MG tablet Take 1 tablet (50 mg total) by mouth 2 (two) times daily. 11/13/15  Yes Ryan M Dunn, PA-C  furosemide (LASIX) 40 MG tablet TAKE 1 TABLET (40 MG TOTAL) BY MOUTH DAILY. 02/06/16  Yes Jearld Fenton, NP  metoprolol succinate (TOPROL-XL) 25 MG 24 hr tablet Take 1 tablet (25 mg total) by mouth 2 (two) times daily. Take with or immediately following a meal. 11/13/15  Yes Wellington Hampshire, MD  traMADol (ULTRAM) 50 MG tablet Take 50 mg by mouth.   Yes Historical Provider, MD  acetaminophen (TYLENOL) 650 MG CR tablet Take 1,300 mg by mouth every 8 (eight) hours as needed for pain.    Historical Provider, MD  amitriptyline (ELAVIL) 25 MG tablet Take 1 tablet (25 mg total) by mouth at bedtime as needed for sleep. Patient not taking: Reported on 03/21/2016 02/16/16   Jearld Fenton, NP  apixaban (ELIQUIS) 2.5 MG TABS tablet Take 1 tablet (2.5 mg total) by mouth 2 (two) times daily. Patient not taking: Reported on 03/21/2016 02/08/16   Leighton Parody, PA-C  bismuth subsalicylate (PEPTO-BISMOL) 262 MG chewable tablet Chew 2 tablets (524 mg total) by mouth 4 (four) times daily -  before meals and at bedtime. Patient taking differently: Chew 524 mg by mouth 3 (three) times daily as needed for indigestion.  11/14/15   Jearld Fenton, NP  oxyCODONE-acetaminophen (ROXICET) 5-325 MG tablet Take 1 tablet by mouth every 4 (four) hours as needed. 02/08/16   Leighton Parody, PA-C  Allergies Penicillins and Vancomycin  Family History  Problem Relation Age of Onset  . Coronary artery disease Mother   . Hyperlipidemia Mother   . Hypertension Mother   . Arthritis Mother   . Coronary artery disease Father   . Heart disease Paternal Grandmother   . Alzheimer's disease Paternal Grandfather     Social History Social History  Substance Use Topics  . Smoking status: Never Smoker  . Smokeless tobacco: Never Used  . Alcohol use Yes     Comment: occassional    Review of Systems Constitutional: No fever/chills Eyes: No visual changes. ENT: No sore throat. Cardiovascular: Denies chest pain. Respiratory: Denies shortness of breath. Gastrointestinal: No abdominal pain.  No nausea, no vomiting.  No diarrhea.  No constipation. Genitourinary: Negative for dysuria. Musculoskeletal: Negative for back pain. Skin: Negative for rash. Neurological: Negative for headaches, focal weakness or  numbness.  10-point ROS otherwise negative.  ____________________________________________   PHYSICAL EXAM:  VITAL SIGNS: ED Triage Vitals  Enc Vitals Group     BP 03/21/16 0928 121/62     Pulse Rate 03/21/16 0928 (!) 59     Resp 03/21/16 0928 20     Temp 03/21/16 0928 97.5 F (36.4 C)     Temp Source 03/21/16 0928 Oral     SpO2 03/21/16 0928 98 %     Weight 03/21/16 0929 273 lb (123.8 kg)     Height 03/21/16 0929 5\' 9"  (1.753 m)     Head Circumference --      Peak Flow --      Pain Score 03/21/16 0927 6     Pain Loc --      Pain Edu? --      Excl. in [place]? --     Constitutional: Alert and oriented. Well appearing and in no acute distress. Eyes: Conjunctivae are normal. PERRL. EOMI. Head: Atraumatic. Nose: No congestion/rhinnorhea. Mouth/Throat: Mucous membranes are moist.  Oropharynx non-erythematous. Neck: No stridor.   Cardiovascular: Normal rate, regular rhythm. Grossly normal heart sounds.  Good peripheral circulation. Respiratory: Normal respiratory effort.  No retractions. Lungs CTAB.peaking in full and clear sentences. Gastrointestinal: Soft and nontender. No distention.  Musculoskeletal: No lower extremity tenderness with evidence of a well-healing left knee surgery with anterior incision, the left leg is mildly swollen compared to the right. Patient reports this is been an ongoing issue since his first surgery, and he has not noticed any appreciable change in leg swelling. Neurologic:  Normal speech and language. No gross focal neurologic deficits are appreciated.  Skin:  Skin is warm, dry and intact. No rash noted. Psychiatric: Mood and affect are normal. Speech and behavior are normal.  ____________________________________________   LABS (all labs ordered are listed, but only abnormal results are displayed)  Labs Reviewed  BASIC METABOLIC PANEL - Abnormal; Notable for the following:       Result Value   Chloride 112 (*)    Glucose, Bld 102 (*)    BUN 25  (*)    Anion gap 1 (*)    All other components within normal limits  CBC - Abnormal; Notable for the following:    Hemoglobin 9.9 (*)    HCT 31.6 (*)    MCV 70.6 (*)    MCH 22.1 (*)    MCHC 31.3 (*)    RDW 17.8 (*)    All other components within normal limits  PROTIME-INR - Abnormal; Notable for the following:    Prothrombin Time 15.7 (*)  All other components within normal limits  FIBRIN DERIVATIVES D-DIMER ([facility] ONLY) - Abnormal; Notable for the following:    Fibrin derivatives D-dimer [facility]) 1,331.08 (*)    All other components within normal limits  TROPONIN I  APTT  TROPONIN I   ____________________________________________  EKG  Reviewed and interpreted by me at 9:30 AM Heart rate 100 qRS 100 QTc 420 Normal sinus rhythm, no evidence of ischemia,no noted ST elevation. ____________________________________________  [id number]  Dg Chest 2 View  Result Date: 03/21/2016 CLINICAL DATA:  Worsening chest pain. EXAM: CHEST  2 VIEW COMPARISON:  11/11/2015. FINDINGS: The heart size and mediastinal contours are within normal limits. Both lungs are clear. The visualized skeletal structures are unremarkable. Thoracic spondylosis. IMPRESSION: No active cardiopulmonary disease. Electronically Signed   By: Staci Righter M.D.   On: 03/21/2016 10:00   Ct Angio Chest Pe W Or Wo Contrast  Result Date: 03/21/2016 CLINICAL DATA:  Chest pain and shortness of breath. History of prior pulmonary emboli EXAM: CT ANGIOGRAPHY CHEST WITH CONTRAST TECHNIQUE: Multidetector CT imaging of the chest was performed using the standard protocol during bolus administration of intravenous contrast. Multiplanar CT image reconstructions and MIPs were obtained to evaluate the vascular anatomy. CONTRAST:  75 mL Isovue 370 nonionic COMPARISON:  Chest CT November 11, 2015 and chest radiograph March 21, 2016 FINDINGS: Cardiovascular: There is no demonstrable pulmonary embolus. No thoracic aortic aneurysm or dissection.  Visualized great vessels appear unremarkable. Pericardium is not appreciably thickened. Mediastinum/Nodes: Visualized thyroid appears normal. There is a lymph node to the left of the aortic arch measuring 2.0 x 1.1 cm, stable. There is a lymph node to the right of the distal trachea measuring 1.4 x 1.0 cm, stable. There are several sub- carinal lymph nodes evident, largest measuring 1.4 x 1.4 cm, stable. There is a prominent lymph node in the right hilum, measuring 1.7 x 1.3 cm. Several lymph nodes are noted in the left hilum which are mildly prominent. The largest of these lymph nodes measures 1.8 x 1.1 cm, stable. No new adenopathy evident. Lungs/Pleura: There is no parenchymal lung edema or consolidation. There is mild fatty proliferation along the posterior pleural surfaces bilaterally, stable. No pleural effusion noted. Upper Abdomen: Visualized upper abdominal structures appear unremarkable. Musculoskeletal: There is degenerative change in the mid to lower thoracic region. There are no blastic or lytic bone lesions. Review of the MIP images confirms the above findings. IMPRESSION: No demonstrable pulmonary embolus. Stable adenopathy of uncertain etiology. No new adenopathy. No parenchymal lung edema or consolidation. Electronically Signed   By: Lowella Grip III M.D.   On: 03/21/2016 11:45     ____________________________________________   PROCEDURES  Procedure(s) performed: None  Procedures  Critical Care performed: No  ____________________________________________   INITIAL IMPRESSION / ASSESSMENT AND PLAN / ED COURSE  Pertinent labs & imaging results that were available during my care of the patient were reviewed by me and considered in my medical decision making (see chart for details).  Patient presents with atypical chest pain, somewhat pleuritic in nature across the upper chest. Began at rest. No tachycardia or hypoxia, he does have recent revision surgery of the left leg, but  is continue to look worse and has an IVC filter making pulmonary embolism seem very unlikely symptoms do not seem typical of coronary disease, he denies a previous history of. EKG reassuring  Given the pats history, we'll proceed with CT angiography to evaluate for recurrent PE plan to continue to follow him clinically,check  second troponi  Clinical Course as of Mar 21 1320  Thu Mar 21, 2016  1228 Repeat EKG reviewed and interpreted by me at [facility] was performed for observation of patient with chest symptomsHeart rate [facility] is 100QTc 4:30Normal sinus rhythm, no evidence of ischemia or ectopy. Appears stable from the previous  [MQ]  1237 Patient resting comfortably. Reviewed his lab work and CT scan thus far. Reports his pain is subsided. Resting no distress.  [MQ]    Clinical Course User Index [MQ] Delman Kitten, MD    ----------------------------------------- 1:22 PM on 03/21/2016 -----------------------------------------  Patient resting comfortably. Reports improvement. 2 troponins negative, EKGs unchanged and reassuring. Patient reported improvement. Feel very unlikely versus acute coronary syndrome, CT angiography negative for acute.  Discussed with the patient, will follow-up with cardiology, his primary provider, and continue his current medication including eliquis. ____________________________________________   FINAL CLINICAL IMPRESSION(S) / ED DIAGNOSES  Final diagnoses:  Chest pain  Nonspecific chest pain  Chest pain, atypical      NEW MEDICATIONS STARTED DURING THIS VISIT:  New Prescriptions   No medications on file     Note:  This document was prepared using Dragon voice recognition software and may include unintentional dictation errors.     Delman Kitten, MD 03/21/16 1323

## 2016-04-01 ENCOUNTER — Ambulatory Visit (INDEPENDENT_AMBULATORY_CARE_PROVIDER_SITE_OTHER): Payer: Managed Care, Other (non HMO) | Admitting: Cardiovascular Disease

## 2016-04-01 ENCOUNTER — Encounter: Payer: Self-pay | Admitting: Cardiovascular Disease

## 2016-04-01 VITALS — BP 136/72 | HR 64 | Ht 69.0 in | Wt 294.2 lb

## 2016-04-01 DIAGNOSIS — I480 Paroxysmal atrial fibrillation: Secondary | ICD-10-CM | POA: Diagnosis not present

## 2016-04-01 NOTE — Progress Notes (Signed)
Cardiology Office Note   Date:  04/01/2016   ID:  Richard [name], DOB Nov 15, 1960, MRN [id number]  PCP:  Webb Silversmith, NP  Cardiologist:   Kathlyn Sacramento, MD   Chief Complaint  Patient presents with  . other    6 month follow up. Meds reviewed by the pt. verbally. "doing well."       History of Present Illness: Richard [name] is a 56 y.o. male who presents for a follow-up visit regarding paroxysmal atrial fibrillation. The patient has history of normal cardiac catheterization in 2015 after an abnormal stress test, hypertension, obesity, osteoarthritis status post bilateral knee surgeries.   The patient had multiple knee surgeries starting in 2016. He developed left lower extremity DVT complicated by massive pulmonary embolism which was treated with catheter-based intervention and IVC filter placement.  He had atrial fibrillation at that time but converted to sinus rhythm. He continued to have intermittent episodes of palpitations thought to be due to breakthrough atrial fibrillation. He was treated successfully with all those flecainide. Lifelong anticoagulation has been recommended. He has been doing well and denies any chest pain or shortness of breath. No palpitations.  Past Medical History:  Diagnosis Date  . Arthritis   . Arthrofibrosis of total knee arthroplasty ([place]) 05/30/2015  . Bilateral pulmonary embolism ([place])    a. 03/2015 CTA: acute bilat PE.  Marland Kitchen Chest pain    a. 2015 Abnl MV;  b. 2015 Cath: nl cors.  . Chronic diastolic CHF (congestive heart failure) ([place])    a. 03/2015 Echo: EF 55-65%, poor windows, mildly dil RV.  Marland Kitchen DVT (deep venous thrombosis) ([place])    a. 03/2015 U/S: occlusive DVT w/in the distal aspect of the Left fem vein through the L popliteal vein.  Marland Kitchen Dysrhythmia    PAF  . GERD (gastroesophageal reflux disease)   . Headache   . Hypertension   . Hypertensive heart disease   . Obesity   . OSA (obstructive sleep apnea)    uses CPAP nightly  .  Primary localized osteoarthritis of left knee    a. 11/2014 s/p L TKA.  . Primary localized osteoarthritis of right knee    a. 02/2014 s/p R Partial Knee arthroplasty.  . Stiffness of left knee    a. 12/2014 s/p manipulation under anesthesia.  . Urine incontinence     Past Surgical History:  Procedure Laterality Date  . APPENDECTOMY    . CARDIAC CATHETERIZATION    . EXAM UNDER ANESTHESIA WITH MANIPULATION OF KNEE Left 05/30/2015   Procedure: EXAM UNDER ANESTHESIA WITH MANIPULATION OF LEFT KNEE;  Surgeon: Marchia Bond, MD;  Location: [place];  Service: Orthopedics;  Laterality: Left;  . I&D KNEE WITH POLY EXCHANGE Left 10/30/2015   Procedure: IRRIGATION AND DEBRIDEMENT KNEE WITH POLY EXCHANGE PLACE SPACERS;  Surgeon: Frederik Pear, MD;  Location: [place];  Service: Orthopedics;  Laterality: Left;  OFF ELIQUIST 3 DAYS  . IVC FILTER PLACEMENT ([facility] HX)  04/13/15  . JOINT REPLACEMENT    . KNEE ARTHROSCOPY Left 08/08/2015   Procedure: LEFT KNEE MANIPULATION WITH ARTHROSCOPIC LYSIS OF ADHESIONS AND ASPIRATION;  Surgeon: Marchia Bond, MD;  Location: [place];  Service: Orthopedics;  Laterality: Left;  . KNEE CLOSED REDUCTION Left 01/20/2015   Procedure: LEFT KNEE MANIPULATION;  Surgeon: Marchia Bond, MD;  Location: [place];  Service: Orthopedics;  Laterality: Left;  . LEFT HEART CATHETERIZATION WITH CORONARY ANGIOGRAM N/A 01/12/2014   Procedure: LEFT HEART CATHETERIZATION WITH CORONARY ANGIOGRAM;  Surgeon: Jettie Booze, MD;  Location: [facility] CATH LAB;  Service: Cardiovascular;  Laterality: N/A;  . ORTHOPEDIC SURGERY Left    arthroscopy x3  . PARTIAL KNEE ARTHROPLASTY Right 02/25/2014   Procedure: RIGHT KNEE ARTHROPLASTY CONDYLE AND PLATEAU MEDIAL COMPARTMENT ;  Surgeon: Johnny Bridge, MD;  Location: [place];  Service: Orthopedics;  Laterality: Right;  . PERIPHERAL VASCULAR CATHETERIZATION N/A 03/29/2015   Procedure: IVC Filter Insertion, and possible thrombectomy;   Surgeon: Katha Cabal, MD;  Location: [place] CV LAB;  Service: Cardiovascular;  Laterality: N/A;  . TOTAL KNEE ARTHROPLASTY  12/06/2014   Procedure: TOTAL KNEE ARTHROPLASTY;  Surgeon: Marchia Bond, MD;  Location: [place];  Service: Orthopedics;;  . TOTAL KNEE REVISION Left 02/08/2016   Procedure: TOTAL KNEE REVISION;  Surgeon: Frederik Pear, MD;  Location: [place];  Service: Orthopedics;  Laterality: Left;     Current Outpatient Prescriptions  Medication Sig Dispense Refill  . acetaminophen (TYLENOL) 650 MG CR tablet Take 1,300 mg by mouth every 8 (eight) hours as needed for pain.    Marland Kitchen apixaban (ELIQUIS) 5 MG TABS tablet Take 5 mg by mouth 2 (two) times daily.    . baclofen (LIORESAL) 10 MG tablet TAKE 1-2 TABLETS (10-20 MG TOTAL) BY MOUTH 3 (THREE) TIMES DAILY AS NEEDED FOR MUSCLE SPASMS. 180 tablet 0  . bismuth subsalicylate (PEPTO-BISMOL) 262 MG chewable tablet Chew 2 tablets (524 mg total) by mouth 4 (four) times daily -  before meals and at bedtime. (Patient taking differently: Chew 524 mg by mouth 3 (three) times daily as needed for indigestion. ) 112 tablet 0  . ELIQUIS 5 MG TABS tablet Take 5 tablets by mouth daily.    Marland Kitchen esomeprazole (NEXIUM) 40 MG capsule Take 40 mg by mouth daily as needed (for acid reflux).     . flecainide (TAMBOCOR) 50 MG tablet Take 1 tablet (50 mg total) by mouth 2 (two) times daily. 180 tablet 3  . furosemide (LASIX) 40 MG tablet TAKE 1 TABLET (40 MG TOTAL) BY MOUTH DAILY. 30 tablet 5  . metoprolol succinate (TOPROL-XL) 25 MG 24 hr tablet Take 1 tablet (25 mg total) by mouth 2 (two) times daily. Take with or immediately following a meal. 90 tablet 3  . oxyCODONE-acetaminophen (ROXICET) 5-325 MG tablet Take 1 tablet by mouth every 4 (four) hours as needed. 30 tablet 0  . traMADol (ULTRAM) 50 MG tablet Take 50 mg by mouth.     No current facility-administered medications for this visit.     Allergies:   Penicillins and Vancomycin    Social History:   The patient  reports that he has never smoked. He has never used smokeless tobacco. He reports that he drinks alcohol. He reports that he does not use drugs.   Family History:  The patient's family history includes Alzheimer's disease in his paternal grandfather; Arthritis in his mother; Coronary artery disease in his father and mother; Heart disease in his paternal grandmother; Hyperlipidemia in his mother; Hypertension in his mother.    ROS:  Please see the history of present illness.   Otherwise, review of systems are positive for none.   All other systems are reviewed and negative.    PHYSICAL EXAM: VS:  BP 136/72 (BP Location: Left Arm, Patient Position: Sitting, Cuff Size: Normal)   Pulse 64   Ht 5\' 9"  (1.753 m)   Wt 294 lb 4 oz (133.5 kg)   BMI 43.45 kg/m  , BMI Body  mass index is 43.45 kg/m. GEN: Well nourished, well developed, in no acute distress  HEENT: normal  Neck: no JVD, carotid bruits, or masses Cardiac: RRR; no murmurs, rubs, or gallops,no edema  Respiratory:  clear to auscultation bilaterally, normal work of breathing GI: soft, nontender, nondistended, + BS MS: no deformity or atrophy  Skin: warm and dry, no rash Neuro:  Strength and sensation are intact Psych: euthymic mood, full affect   EKG:  EKG is ordered today. The ekg ordered today demonstrates normal sinus rhythm   Recent Labs: 06/22/2015: ALT 16 11/11/2015: Magnesium 1.8 03/21/2016: BUN 25; Creatinine, Ser 1.23; Hemoglobin 9.9; Platelets 341; Potassium 4.4; Sodium 140    Lipid Panel    Component Value Date/Time   CHOL 124 03/29/2015 0434   TRIG 71 03/29/2015 0434   HDL 30 (L) 03/29/2015 0434   CHOLHDL 4.1 03/29/2015 0434   VLDL 14 03/29/2015 0434   LDLCALC 80 03/29/2015 0434      Wt Readings from Last 3 Encounters:  04/01/16 294 lb 4 oz (133.5 kg)  03/21/16 273 lb (123.8 kg)  02/16/16 271 lb 12 oz (123.3 kg)       ASSESSMENT AND PLAN:  1.  Paroxysmal atrial fibrillation:  No evidence  of recurrent arrhythmia on flecainide.  2. Pulmonary embolism: Given the burden of his pulmonary embolism, recommend continuing anticoagulation. Most likely, he will require lifelong anticoagulation. If his mobility improves to normal, hypercoagulable workup can be considered to guide the length of anticoagulation.   Disposition:   FU with me in 6 months  Signed,  Kathlyn Sacramento, MD  04/01/2016 10:51 AM    [place]

## 2016-04-01 NOTE — Patient Instructions (Signed)
Medication Instructions: Continue same medications.   Labwork: None.   Procedures/Testing: None.   Follow-Up: 6 months with Dr. Tana Trefry.   Any Additional Special Instructions Will Be Listed Below (If Applicable).     If you need a refill on your cardiac medications before your next appointment, please call your pharmacy.   

## 2016-04-08 ENCOUNTER — Other Ambulatory Visit: Payer: Self-pay | Admitting: Family Medicine

## 2016-04-08 ENCOUNTER — Ambulatory Visit (INDEPENDENT_AMBULATORY_CARE_PROVIDER_SITE_OTHER): Payer: Managed Care, Other (non HMO) | Admitting: Internal Medicine

## 2016-04-08 ENCOUNTER — Encounter: Payer: Self-pay | Admitting: Internal Medicine

## 2016-04-08 VITALS — BP 122/68 | HR 58 | Temp 97.6°F | Wt 290.0 lb

## 2016-04-08 DIAGNOSIS — R0789 Other chest pain: Secondary | ICD-10-CM | POA: Diagnosis not present

## 2016-04-08 DIAGNOSIS — M129 Arthropathy, unspecified: Secondary | ICD-10-CM

## 2016-04-08 MED ORDER — TRAMADOL HCL 50 MG PO TABS: 100.0000 mg | ORAL_TABLET | Freq: Two times a day (BID) | ORAL | 0 refills | Status: DC | PRN

## 2016-04-08 NOTE — Patient Instructions (Addendum)

## 2016-04-08 NOTE — Progress Notes (Signed)
Subjective:    Patient ID: Richard [name], male    DOB: [birth year], 56 y.o.   MRN: [id number]  HPI  Pt presents to the clinic today for ER follow up. He woke up at 2 am on 03/21/16, with sharp stabbing chest pain. The pain seems worse with taking a deep breath. Due to his history of recent surgery and prior PE, he went to [facility]. They did a CT angio chest which was negative for PE. ECG did not show any acute changes. D dimer was elevated but troponin's were negative. He was eventually discharge home without any intervention. He has already followed up with cardiology, Dr. Mayer Camel. Note from 04/01/16 reviewed. He is currently on Eliquis. Since discharge, he reports he has been feeling okay. He continues to have the chest pain intermittently. It seems worse with movement and better with stretching. He does have reflux but is not taking his Nexium.  He is also concerned about arthritic pain. He reports he has to take 2 Tramadol to get relief from his pain. He has Oxycodone, but he reports this does not help. He wants to know if there is anything stronger.  Review of Systems      Past Medical History:  Diagnosis Date  . Arthritis   . Arthrofibrosis of total knee arthroplasty ([place]) 05/30/2015  . Bilateral pulmonary embolism ([place])    a. 03/2015 CTA: acute bilat PE.  Marland Kitchen Chest pain    a. 2015 Abnl MV;  b. 2015 Cath: nl cors.  . Chronic diastolic CHF (congestive heart failure) ([place])    a. 03/2015 Echo: EF 55-65%, poor windows, mildly dil RV.  Marland Kitchen DVT (deep venous thrombosis) ([place])    a. 03/2015 U/S: occlusive DVT w/in the distal aspect of the Left fem vein through the L popliteal vein.  Marland Kitchen Dysrhythmia    PAF  . GERD (gastroesophageal reflux disease)   . Headache   . Hypertension   . Hypertensive heart disease   . Obesity   . OSA (obstructive sleep apnea)    uses CPAP nightly  . Primary localized osteoarthritis of left knee    a. 11/2014 s/p L TKA.  . Primary localized osteoarthritis of right knee    a. 02/2014 s/p R Partial Knee arthroplasty.  . Stiffness of left knee    a. 12/2014 s/p manipulation under anesthesia.  . Urine incontinence     Current Outpatient Prescriptions  Medication Sig Dispense Refill  . acetaminophen (TYLENOL) 650 MG CR tablet Take 1,300 mg by mouth every 8 (eight) hours as needed for pain.    Marland Kitchen apixaban (ELIQUIS) 5 MG TABS tablet Take 5 mg by mouth 2 (two) times daily.    . baclofen (LIORESAL) 10 MG tablet TAKE 1-2 TABLETS (10-20 MG TOTAL) BY MOUTH 3 (THREE) TIMES DAILY AS NEEDED FOR MUSCLE SPASMS. 180 tablet 0  . bismuth subsalicylate (PEPTO-BISMOL) 262 MG chewable tablet Chew 2 tablets (524 mg total) by mouth 4 (four) times daily -  before meals and at bedtime. (Patient taking differently: Chew 524 mg by mouth 3 (three) times daily as needed for indigestion. ) 112 tablet 0  . esomeprazole (NEXIUM) 40 MG capsule Take 40 mg by mouth daily as needed (for acid reflux).     . flecainide (TAMBOCOR) 50 MG tablet Take 1 tablet (50 mg total) by mouth 2 (two) times daily. 180 tablet 3  . furosemide (LASIX) 40 MG tablet TAKE 1 TABLET (40 MG TOTAL) BY MOUTH DAILY. 30 tablet 5  .  metoprolol succinate (TOPROL-XL) 25 MG 24 hr tablet Take 1 tablet (25 mg total) by mouth 2 (two) times daily. Take with or immediately following a meal. 90 tablet 3  . oxyCODONE-acetaminophen (ROXICET) 5-325 MG tablet Take 1 tablet by mouth every 4 (four) hours as needed. 30 tablet 0  . traMADol (ULTRAM) 50 MG tablet Take 50 mg by mouth.     No current facility-administered medications for this visit.     Allergies  Allergen Reactions  . Penicillins Anaphylaxis and Other (See Comments)    Childhood allergy.  . Vancomycin Other (See Comments)    Red Mans' syndrome    Family History  Problem Relation Age of Onset  . Coronary artery disease Mother   . Hyperlipidemia Mother   . Hypertension Mother   . Arthritis Mother   . Coronary artery disease Father   . Heart disease Paternal Grandmother     . Alzheimer's disease Paternal Grandfather     Social History   Social History  . Marital status: Married    Spouse name: N/A  . Number of children: N/A  . 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   Social History Main Topics  . Smoking status: Never Smoker  . Smokeless tobacco: Never Used  . Alcohol use Yes     Comment: occassional  . Drug use: No  . Sexual activity: Yes   Other Topics Concern  . Not on file   Social History Narrative  . No narrative on file     Constitutional: Denies fever, malaise, fatigue, headache or abrupt weight changes.  Respiratory: Denies difficulty breathing, shortness of breath, cough or sputum production.   Cardiovascular: Pt reports chest pain. Denies chest pain, chest tightness, palpitations or swelling in the hands or feet.  Gastrointestinal: Denies abdominal pain, bloating, constipation, diarrhea or blood in the stool.  GU: Denies urgency, frequency, pain with urination, burning sensation, blood in urine, odor or discharge. Musculoskeletal: Pt reports joint pain. Denies decrease in range of motion, difficulty with gait, muscle pain or joint  swelling.    No other specific complaints in a complete review of systems (except as listed in HPI above).  Objective:   Physical Exam  BP 122/68   Pulse (!) 58   Temp 97.6 F (36.4 C) (Oral)   Wt 290 lb (131.5 kg)   SpO2 99%   BMI 42.83 kg/m  Wt Readings from Last 3 Encounters:  04/08/16 290 lb (131.5 kg)  04/01/16 294 lb 4 oz (133.5 kg)  03/21/16 273 lb (123.8 kg)    General: Appears his stated age, obese in NAD. Cardiovascular: Normal rate and rhythm. S1,S2 noted.  No murmur, rubs or gallops noted.  Pulmonary/Chest: Normal effort and positive vesicular breath sounds. No respiratory distress. No wheezes, rales or ronchi noted.  Abdomen: Soft and nontender. Normal bowel sounds. No distention or masses noted.  Musculoskeletal: Left side chest wall tender to  palpation.     BMET    Component Value Date/Time   NA 140 03/21/2016 0956   K 4.4 03/21/2016 0956   CL 112 (H) 03/21/2016 0956   CO2 27 03/21/2016 0956   GLUCOSE 102 (H) 03/21/2016 0956   BUN 25 (H) 03/21/2016 0956   CREATININE 1.23 03/21/2016 0956   CREATININE 1.27 12/06/2015 1600   CALCIUM 9.1 03/21/2016 0956   GFRNONAA >60 03/21/2016 0956   GFRAA >60 03/21/2016 0956    Lipid Panel     Component Value  Date/Time   CHOL 124 03/29/2015 0434   TRIG 71 03/29/2015 0434   HDL 30 (L) 03/29/2015 0434   CHOLHDL 4.1 03/29/2015 0434   VLDL 14 03/29/2015 0434   LDLCALC 80 03/29/2015 0434    CBC    Component Value Date/Time   WBC 8.6 03/21/2016 0956   RBC 4.47 03/21/2016 0956   HGB 9.9 (L) 03/21/2016 0956   HCT 31.6 (L) 03/21/2016 0956   PLT 341 03/21/2016 0956   MCV 70.6 (L) 03/21/2016 0956   MCH 22.1 (L) 03/21/2016 0956   MCHC 31.3 (L) 03/21/2016 0956   RDW 17.8 (H) 03/21/2016 0956   LYMPHSABS 2.1 01/31/2016 1325   MONOABS 0.5 01/31/2016 1325   EOSABS 0.5 01/31/2016 1325   BASOSABS 0.1 01/31/2016 1325    Hgb A1C Lab Results  Component Value Date   HGBA1C 5.5 06/22/2015           Assessment & Plan:   ER follow up for atypical chest pain:  ER notes, labs and imaging reviewed Does not appear cardiac in nature Likely MSK Discussed stretching, heat I also want him to start taking Nexium daily in case there is an acid reflux component Let me know if this does not improve  Arthritis:  Multiple joints Increase Tramadol to 100 mg BID RX provided today  RTC as needed or if symptoms persist or worsen Calen Geister, NP

## 2016-04-08 NOTE — Telephone Encounter (Signed)
Dr. Glori Bickers filled Rx once for PCP, last filled on 03/08/16 #180 tabs with 0 refills, Rx routed back to PCP

## 2016-05-06 ENCOUNTER — Other Ambulatory Visit: Payer: Self-pay | Admitting: Internal Medicine

## 2016-05-06 NOTE — Telephone Encounter (Signed)
Last filled 04/08/16--please advise

## 2016-05-27 ENCOUNTER — Other Ambulatory Visit: Payer: Self-pay | Admitting: Internal Medicine

## 2016-05-28 ENCOUNTER — Ambulatory Visit (INDEPENDENT_AMBULATORY_CARE_PROVIDER_SITE_OTHER): Payer: Managed Care, Other (non HMO) | Admitting: Internal Medicine

## 2016-05-28 ENCOUNTER — Encounter: Payer: Self-pay | Admitting: Internal Medicine

## 2016-05-28 VITALS — BP 128/74 | HR 69 | Temp 97.9°F | Wt 298.0 lb

## 2016-05-28 DIAGNOSIS — M25572 Pain in left ankle and joints of left foot: Secondary | ICD-10-CM

## 2016-05-28 DIAGNOSIS — M25472 Effusion, left ankle: Secondary | ICD-10-CM

## 2016-05-28 MED ORDER — PREDNISONE 10 MG PO TABS
ORAL_TABLET | ORAL | 0 refills | Status: DC
Start: 2016-05-28 — End: 2016-06-04

## 2016-05-28 NOTE — Patient Instructions (Signed)
RICE for Routine Care of Injuries Many injuries can be cared for using rest, ice, compression, and elevation (RICE therapy). Using RICE therapy can help to lessen pain and swelling. It can help your body to heal. Rest Reduce your normal activities and avoid using the injured part of your body. You can go back to your normal activities when you feel okay and your doctor says it is okay. Ice Do not put ice on your bare skin.  Put ice in a plastic bag.  Place a towel between your skin and the bag.  Leave the ice on for 20 minutes, 2-3 times a day.  Do this for as long as told by your doctor. Compression Compression means putting pressure on the injured area. This can be done with an elastic bandage. If an elastic bandage has been applied:  Remove and reapply the bandage every 3-4 hours or as told by your doctor.  Make sure the bandage is not wrapped too tight. Wrap the bandage more loosely if part of your body beyond the bandage is blue, swollen, cold, painful, or loses feeling (numb).  See your doctor if the bandage seems to make your problems worse.  Elevation Elevation means keeping the injured area raised. Raise the injured area above your heart or the center of your chest if you can. When should I get help? You should get help if:  You keep having pain and swelling.  Your symptoms get worse.  Get help right away if: You should get help right away if:  You have sudden bad pain at or below the area of your injury.  You have redness or more swelling around your injury.  You have tingling or numbness at or below the injury that does not go away when you take off the bandage.  This information is not intended to replace advice given to you by your health care provider. Make sure you discuss any questions you have with your health care provider. Document Released: 06/26/2007 Document Revised: 12/05/2015 Document Reviewed: 12/15/2013 Elsevier Interactive Patient Education  2017  Elsevier Inc.  

## 2016-05-28 NOTE — Progress Notes (Signed)
Subjective:    Patient ID: Richard [name], male    DOB: 04/12/1960, 56 y.o.   MRN: [id number]  HPI  Pt presents to the clinic today with c/o left ankle pain. This started 3-4 days ago. He is having difficulty flexing his foot up. He has pain with weight bearing. He has noticed some swelling of the left ankle. He denies redness or warmth. He denies any injury to the area. He has not taken anything OTC for this.  Review of Systems      Past Medical History:  Diagnosis Date  . Arthritis   . Arthrofibrosis of total knee arthroplasty ([place]) 05/30/2015  . Bilateral pulmonary embolism ([place])    a. 03/2015 CTA: acute bilat PE.  Marland Kitchen Chest pain    a. 2015 Abnl MV;  b. 2015 Cath: nl cors.  . Chronic diastolic CHF (congestive heart failure) ([place])    a. 03/2015 Echo: EF 55-65%, poor windows, mildly dil RV.  Marland Kitchen DVT (deep venous thrombosis) ([place])    a. 03/2015 U/S: occlusive DVT w/in the distal aspect of the Left fem vein through the L popliteal vein.  Marland Kitchen Dysrhythmia    PAF  . GERD (gastroesophageal reflux disease)   . Headache   . Hypertension   . Hypertensive heart disease   . Obesity   . OSA (obstructive sleep apnea)    uses CPAP nightly  . Primary localized osteoarthritis of left knee    a. 11/2014 s/p L TKA.  . Primary localized osteoarthritis of right knee    a. 02/2014 s/p R Partial Knee arthroplasty.  . Stiffness of left knee    a. 12/2014 s/p manipulation under anesthesia.  . Urine incontinence     Current Outpatient Prescriptions  Medication Sig Dispense Refill  . acetaminophen (TYLENOL) 650 MG CR tablet Take 1,300 mg by mouth every 8 (eight) hours as needed for pain.    Marland Kitchen apixaban (ELIQUIS) 5 MG TABS tablet Take 5 mg by mouth 2 (two) times daily.    . baclofen (LIORESAL) 10 MG tablet TAKE 1-2 TABLETS (10-20 MG TOTAL) BY MOUTH 3 (THREE) TIMES DAILY AS NEEDED FOR MUSCLE SPASMS. 180 tablet 0  . bismuth subsalicylate (PEPTO-BISMOL) 262 MG chewable tablet Chew 2 tablets (524 mg total)  by mouth 4 (four) times daily -  before meals and at bedtime. (Patient taking differently: Chew 524 mg by mouth 3 (three) times daily as needed for indigestion. ) 112 tablet 0  . esomeprazole (NEXIUM) 40 MG capsule Take 40 mg by mouth daily as needed (for acid reflux).     . flecainide (TAMBOCOR) 50 MG tablet Take 1 tablet (50 mg total) by mouth 2 (two) times daily. 180 tablet 3  . furosemide (LASIX) 40 MG tablet TAKE 1 TABLET (40 MG TOTAL) BY MOUTH DAILY. 30 tablet 5  . metoprolol succinate (TOPROL-XL) 25 MG 24 hr tablet Take 1 tablet (25 mg total) by mouth 2 (two) times daily. Take with or immediately following a meal. 90 tablet 3  . oxyCODONE-acetaminophen (ROXICET) 5-325 MG tablet Take 1 tablet by mouth every 4 (four) hours as needed. 30 tablet 0  . traMADol (ULTRAM) 50 MG tablet Take 2 tablets (100 mg total) by mouth every 12 (twelve) hours as needed. 120 tablet 0   No current facility-administered medications for this visit.     Allergies  Allergen Reactions  . Penicillins Anaphylaxis and Other (See Comments)    Childhood allergy.  . Vancomycin Other (See Comments)  Red Mans' syndrome    Family History  Problem Relation Age of Onset  . Coronary artery disease Mother   . Hyperlipidemia Mother   . Hypertension Mother   . Arthritis Mother   . Coronary artery disease Father   . Heart disease Paternal Grandmother   . Alzheimer's disease Paternal Grandfather     Social History   Social History  . Marital status: Married    Spouse name: N/A  . Number of children: N/A  . 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   Social History Main Topics  . Smoking status: Never Smoker  . Smokeless tobacco: Never Used  . Alcohol use Yes     Comment: occassional  . Drug use: No  . Sexual activity: Yes   Other Topics Concern  . Not on file   Social History Narrative  . No narrative on file     Constitutional: Denies fever, malaise, fatigue, headache  or abrupt weight changes.  Musculoskeletal: Pt reports left ankle pain and swelling. Denies muscle pain.  Skin: Denies redness, rashes, lesions or ulcercations.    No other specific complaints in a complete review of systems (except as listed in HPI above).  Objective:   Physical Exam  BP 128/74   Pulse 69   Temp 97.9 F (36.6 C) (Oral)   Wt 298 lb (135.2 kg)   SpO2 98%   BMI 44.01 kg/m  Wt Readings from Last 3 Encounters:  05/28/16 298 lb (135.2 kg)  04/08/16 290 lb (131.5 kg)  04/01/16 294 lb 4 oz (133.5 kg)    General: Appears his stated age, obese in NAD. Skin: Warm, dry and intact. No redness or warmth noted of left ankle. Cardiovascular: 2+ pedal pulse on the left. Musculoskeletal: Pain with flexion noted of the left ankle. Normal extension. Pain over the anterior ankle. Mild swelling noted. He is unable to walk on toes or heels. He limps with normal gait.   BMET    Component Value Date/Time   NA 140 03/21/2016 0956   K 4.4 03/21/2016 0956   CL 112 (H) 03/21/2016 0956   CO2 27 03/21/2016 0956   GLUCOSE 102 (H) 03/21/2016 0956   BUN 25 (H) 03/21/2016 0956   CREATININE 1.23 03/21/2016 0956   CREATININE 1.27 12/06/2015 1600   CALCIUM 9.1 03/21/2016 0956   GFRNONAA >60 03/21/2016 0956   GFRAA >60 03/21/2016 0956    Lipid Panel     Component Value Date/Time   CHOL 124 03/29/2015 0434   TRIG 71 03/29/2015 0434   HDL 30 (L) 03/29/2015 0434   CHOLHDL 4.1 03/29/2015 0434   VLDL 14 03/29/2015 0434   LDLCALC 80 03/29/2015 0434    CBC    Component Value Date/Time   WBC 8.6 03/21/2016 0956   RBC 4.47 03/21/2016 0956   HGB 9.9 (L) 03/21/2016 0956   HCT 31.6 (L) 03/21/2016 0956   PLT 341 03/21/2016 0956   MCV 70.6 (L) 03/21/2016 0956   MCH 22.1 (L) 03/21/2016 0956   MCHC 31.3 (L) 03/21/2016 0956   RDW 17.8 (H) 03/21/2016 0956   LYMPHSABS 2.1 01/31/2016 1325   MONOABS 0.5 01/31/2016 1325   EOSABS 0.5 01/31/2016 1325   BASOSABS 0.1 01/31/2016 1325     Hgb A1C Lab Results  Component Value Date   HGBA1C 5.5 06/22/2015            Assessment & Plan:   Left Ankle Pain and Swelling:  No known injury No c/w sprain or gout Can not take NSAIDS due to Eliquis eRx for Pred Taper x 6 days Advised him to try ice and elevation  Let me know if symptoms persist or worsen BAITY, REGINA, NP

## 2016-06-03 ENCOUNTER — Telehealth: Payer: Self-pay

## 2016-06-03 NOTE — Telephone Encounter (Signed)
Pt has appt scheduled for tomorrow.

## 2016-06-03 NOTE — Telephone Encounter (Signed)
He should make a follow up appt so that we can xray or it. Or he can call his orthopedist to see if he can evaluate it.

## 2016-06-03 NOTE — Telephone Encounter (Signed)
Pt was seen 05/28/16 and has finished prednisone this morning; no improvement; lt top of ankle painful (pain level 7) and swollen; pt has been elevating and icing ankle. Pt wants to know what to do next. [facility]. Pt request cb.

## 2016-06-04 ENCOUNTER — Encounter: Payer: Self-pay | Admitting: Internal Medicine

## 2016-06-04 ENCOUNTER — Ambulatory Visit (INDEPENDENT_AMBULATORY_CARE_PROVIDER_SITE_OTHER)
Admission: RE | Admit: 2016-06-04 | Discharge: 2016-06-04 | Disposition: A | Discharge status: Home / Self Care | Payer: Managed Care, Other (non HMO) | Source: Ambulatory Visit | Attending: Internal Medicine | Admitting: Internal Medicine

## 2016-06-04 ENCOUNTER — Ambulatory Visit (INDEPENDENT_AMBULATORY_CARE_PROVIDER_SITE_OTHER): Payer: Managed Care, Other (non HMO) | Admitting: Internal Medicine

## 2016-06-04 VITALS — BP 110/52 | HR 69 | Temp 97.6°F | Ht 69.0 in | Wt 299.0 lb

## 2016-06-04 DIAGNOSIS — M25472 Effusion, left ankle: Secondary | ICD-10-CM

## 2016-06-04 DIAGNOSIS — M25572 Pain in left ankle and joints of left foot: Secondary | ICD-10-CM

## 2016-06-04 NOTE — Progress Notes (Signed)
Subjective:    Patient ID: Richard [name], male    DOB: 02/04/1960, 57 y.o.   MRN: [id number]  HPI  Pt presents to the clinic today with c/o persistent left ankle pain and swelling. He was seen 5/8 for the same. He was given a round of Prednisone, which he reports has made no difference in his pain or swelling. He is still having difficulty flexing his left foot. He has pain with weight bearing. He has been trying to keep his left left elevated but it doesn't seem to be helping. He is requesting further evaluation today.  Review of Systems      Past Medical History:  Diagnosis Date  . Arthritis   . Arthrofibrosis of total knee arthroplasty ([place]) 05/30/2015  . Bilateral pulmonary embolism ([place])    a. 03/2015 CTA: acute bilat PE.  Marland Kitchen Chest pain    a. 2015 Abnl MV;  b. 2015 Cath: nl cors.  . Chronic diastolic CHF (congestive heart failure) ([place])    a. 03/2015 Echo: EF 55-65%, poor windows, mildly dil RV.  Marland Kitchen DVT (deep venous thrombosis) ([place])    a. 03/2015 U/S: occlusive DVT w/in the distal aspect of the Left fem vein through the L popliteal vein.  Marland Kitchen Dysrhythmia    PAF  . GERD (gastroesophageal reflux disease)   . Headache   . Hypertension   . Hypertensive heart disease   . Obesity   . OSA (obstructive sleep apnea)    uses CPAP nightly  . Primary localized osteoarthritis of left knee    a. 11/2014 s/p L TKA.  . Primary localized osteoarthritis of right knee    a. 02/2014 s/p R Partial Knee arthroplasty.  . Stiffness of left knee    a. 12/2014 s/p manipulation under anesthesia.  . Urine incontinence     Current Outpatient Prescriptions  Medication Sig Dispense Refill  . acetaminophen (TYLENOL) 650 MG CR tablet Take 1,300 mg by mouth every 8 (eight) hours as needed for pain.    Marland Kitchen apixaban (ELIQUIS) 5 MG TABS tablet Take 5 mg by mouth 2 (two) times daily.    . baclofen (LIORESAL) 10 MG tablet TAKE 1-2 TABLETS (10-20 MG TOTAL) BY MOUTH 3 (THREE) TIMES DAILY AS NEEDED FOR MUSCLE  SPASMS. 180 tablet 0  . bismuth subsalicylate (PEPTO-BISMOL) 262 MG chewable tablet Chew 2 tablets (524 mg total) by mouth 4 (four) times daily -  before meals and at bedtime. (Patient taking differently: Chew 524 mg by mouth 3 (three) times daily as needed for indigestion. ) 112 tablet 0  . esomeprazole (NEXIUM) 40 MG capsule Take 40 mg by mouth daily as needed (for acid reflux).     . flecainide (TAMBOCOR) 50 MG tablet Take 1 tablet (50 mg total) by mouth 2 (two) times daily. 180 tablet 3  . furosemide (LASIX) 40 MG tablet TAKE 1 TABLET (40 MG TOTAL) BY MOUTH DAILY. 30 tablet 5  . metoprolol succinate (TOPROL-XL) 25 MG 24 hr tablet Take 1 tablet (25 mg total) by mouth 2 (two) times daily. Take with or immediately following a meal. 90 tablet 3  . oxyCODONE-acetaminophen (ROXICET) 5-325 MG tablet Take 1 tablet by mouth every 4 (four) hours as needed. 30 tablet 0  . traMADol (ULTRAM) 50 MG tablet Take 2 tablets (100 mg total) by mouth every 12 (twelve) hours as needed. 120 tablet 0   No current facility-administered medications for this visit.     Allergies  Allergen Reactions  . Penicillins  Anaphylaxis and Other (See Comments)    Childhood allergy.  . Vancomycin Other (See Comments)    Red Mans' syndrome    Family History  Problem Relation Age of Onset  . Coronary artery disease Mother   . Hyperlipidemia Mother   . Hypertension Mother   . Arthritis Mother   . Coronary artery disease Father   . Heart disease Paternal Grandmother   . Alzheimer's disease Paternal Grandfather     Social History   Social History  . Marital status: Married    Spouse name: N/A  . Number of children: N/A  . 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   Social History Main Topics  . Smoking status: Never Smoker  . Smokeless tobacco: Never Used  . Alcohol use Yes     Comment: occassional  . Drug use: No  . Sexual activity: Yes   Other Topics Concern  . Not on file    Social History Narrative  . No narrative on file     Constitutional: Denies fever, malaise, fatigue, headache or abrupt weight changes.  Musculoskeletal: Pt reports left ankle pain and swelling. Denies muscle pain or joint swelling.  Skin: Denies redness, rashes, lesions or ulcercations.    No other specific complaints in a complete review of systems (except as listed in HPI above).  Objective:   Physical Exam   BP (!) 110/52   Pulse 69   Temp 97.6 F (36.4 C)   Ht 5\' 9"  (1.753 m)   Wt 299 lb (135.6 kg)   SpO2 95%   BMI 44.15 kg/m  Wt Readings from Last 3 Encounters:  06/04/16 299 lb (135.6 kg)  05/28/16 298 lb (135.2 kg)  04/08/16 290 lb (131.5 kg)    General: Appears his stated age, obese in NAD. Skin: Warm, dry and intact. No redness or warmth noted. Musculoskeletal: Normal extension, difficulty with flexion of the left ankle. He still has swelling noted over the anterior ankle. He limps when he walks.   BMET    Component Value Date/Time   NA 140 03/21/2016 0956   K 4.4 03/21/2016 0956   CL 112 (H) 03/21/2016 0956   CO2 27 03/21/2016 0956   GLUCOSE 102 (H) 03/21/2016 0956   BUN 25 (H) 03/21/2016 0956   CREATININE 1.23 03/21/2016 0956   CREATININE 1.27 12/06/2015 1600   CALCIUM 9.1 03/21/2016 0956   GFRNONAA >60 03/21/2016 0956   GFRAA >60 03/21/2016 0956    Lipid Panel     Component Value Date/Time   CHOL 124 03/29/2015 0434   TRIG 71 03/29/2015 0434   HDL 30 (L) 03/29/2015 0434   CHOLHDL 4.1 03/29/2015 0434   VLDL 14 03/29/2015 0434   LDLCALC 80 03/29/2015 0434    CBC    Component Value Date/Time   WBC 8.6 03/21/2016 0956   RBC 4.47 03/21/2016 0956   HGB 9.9 (L) 03/21/2016 0956   HCT 31.6 (L) 03/21/2016 0956   PLT 341 03/21/2016 0956   MCV 70.6 (L) 03/21/2016 0956   MCH 22.1 (L) 03/21/2016 0956   MCHC 31.3 (L) 03/21/2016 0956   RDW 17.8 (H) 03/21/2016 0956   LYMPHSABS 2.1 01/31/2016 1325   MONOABS 0.5 01/31/2016 1325   EOSABS 0.5  01/31/2016 1325   BASOSABS 0.1 01/31/2016 1325    Hgb A1C Lab Results  Component Value Date   HGBA1C 5.5 06/22/2015           Assessment & Plan:  Left Ankle Pain and Swelling:  He failed Prednisone Will obtain xray today May need MRI He refuses PT at this time Advised him to wear a compression sleeve on that ankle  Will follow up after xray, RTC as needed Webb Silversmith, NP

## 2016-06-05 ENCOUNTER — Encounter: Payer: Self-pay | Admitting: Internal Medicine

## 2016-06-05 ENCOUNTER — Telehealth: Payer: Self-pay | Admitting: Internal Medicine

## 2016-06-05 NOTE — Telephone Encounter (Signed)
Patient returned Waynetta's call. °

## 2016-06-21 NOTE — Addendum Note (Signed)
Addendum  created 06/21/16 4834 by Rica Koyanagi, MD   Sign clinical note

## 2016-07-08 ENCOUNTER — Other Ambulatory Visit: Payer: Self-pay

## 2016-07-08 MED ORDER — TRAMADOL HCL 50 MG PO TABS: 100.0000 mg | ORAL_TABLET | Freq: Two times a day (BID) | ORAL | 0 refills | Status: DC | PRN

## 2016-07-08 NOTE — Telephone Encounter (Signed)
[facility]amadol [facility] into [id number] [place], [place] - [address] Phone: [phone] 

## 2016-07-08 NOTE — Telephone Encounter (Signed)
Pt left v/m requesting refill tramadol due to arthritis pain to [facility]. Pt request cb when completed.  Avie Echevaria NP out of office and no computer access.Please advise. Last refill # 120 on 04/08/16. Last seen 06/04/16.

## 2016-07-15 ENCOUNTER — Encounter (INDEPENDENT_AMBULATORY_CARE_PROVIDER_SITE_OTHER): Payer: Self-pay

## 2016-07-15 ENCOUNTER — Ambulatory Visit (INDEPENDENT_AMBULATORY_CARE_PROVIDER_SITE_OTHER): Payer: Managed Care, Other (non HMO) | Admitting: Vascular Surgery

## 2016-07-15 ENCOUNTER — Encounter (INDEPENDENT_AMBULATORY_CARE_PROVIDER_SITE_OTHER): Payer: Self-pay | Admitting: Vascular Surgery

## 2016-07-15 VITALS — BP 113/67 | HR 56 | Resp 19 | Ht 69.0 in | Wt 303.0 lb

## 2016-07-15 DIAGNOSIS — I10 Essential (primary) hypertension: Secondary | ICD-10-CM

## 2016-07-15 DIAGNOSIS — I82452 Acute embolism and thrombosis of left peroneal vein: Secondary | ICD-10-CM

## 2016-07-15 DIAGNOSIS — I82492 Acute embolism and thrombosis of other specified deep vein of left lower extremity: Secondary | ICD-10-CM | POA: Diagnosis not present

## 2016-07-15 DIAGNOSIS — I2609 Other pulmonary embolism with acute cor pulmonale: Secondary | ICD-10-CM

## 2016-07-15 DIAGNOSIS — K219 Gastro-esophageal reflux disease without esophagitis: Secondary | ICD-10-CM | POA: Diagnosis not present

## 2016-07-15 NOTE — Progress Notes (Signed)
MRN : [id number]  Richard [name] is a 56 y.o. (05/06/1960) male who presents with chief complaint of  Chief Complaint  Patient presents with  . Re-evaluation    EVAL for Filter removal  .  History of Present Illness:  The patient presents to the office for follow up evaluation of DVT associated with PE.  The initial symptoms were pain and swelling in the lower extremity.  He is now s/p left Redo TKR secondary to an infected prosthesis. Surgery was in January of 2018.  Surgery seems to have gone well.  The patient notes the left leg continues to be painful with dependency and swells quite a bite.  Symptoms are much better with elevation.  The patient notes minimal edema in the morning which steadily worsens throughout the day.    The patient has not been using compression therapy at this point.  No SOB or pleuritic chest pains.  No cough or hemoptysis.  No blood per rectum or blood in any sputum.  No excessive bruising per the patient.      Current Meds  Medication Sig  . acetaminophen (TYLENOL) 650 MG CR tablet Take 1,300 mg by mouth every 8 (eight) hours as needed for pain.  Marland Kitchen apixaban (ELIQUIS) 5 MG TABS tablet Take 5 mg by mouth 2 (two) times daily.  . baclofen (LIORESAL) 10 MG tablet TAKE 1-2 TABLETS (10-20 MG TOTAL) BY MOUTH 3 (THREE) TIMES DAILY AS NEEDED FOR MUSCLE SPASMS.  Marland Kitchen bismuth subsalicylate (PEPTO-BISMOL) 262 MG chewable tablet Chew 2 tablets (524 mg total) by mouth 4 (four) times daily -  before meals and at bedtime. (Patient taking differently: Chew 524 mg by mouth 3 (three) times daily as needed for indigestion. )  . esomeprazole (NEXIUM) 40 MG capsule Take 40 mg by mouth daily as needed (for acid reflux).   . flecainide (TAMBOCOR) 50 MG tablet Take 1 tablet (50 mg total) by mouth 2 (two) times daily.  . furosemide (LASIX) 40 MG tablet TAKE 1 TABLET (40 MG TOTAL) BY MOUTH DAILY.  . metoprolol succinate (TOPROL-XL) 25 MG 24 hr tablet Take 1 tablet (25 mg total)  by mouth 2 (two) times daily. Take with or immediately following a meal.  . oxyCODONE-acetaminophen (ROXICET) 5-325 MG tablet Take 1 tablet by mouth every 4 (four) hours as needed.  . predniSONE (DELTASONE) 10 MG tablet TAKE 3 TABS ON DAYS 1-2, TAKE 2 TABS ON DAYS 3-4, TAKE 1 TAB ON DAYS 5-6  . topiramate (TOPAMAX) 25 MG tablet TAKE 2 TABLETS (50 MG TOTAL) BY MOUTH NIGHTLY.  . traMADol (ULTRAM) 50 MG tablet Take 2 tablets (100 mg total) by mouth every 12 (twelve) hours as needed.    Past Medical History:  Diagnosis Date  . Arthritis   . Arthrofibrosis of total knee arthroplasty ([place]) 05/30/2015  . Bilateral pulmonary embolism ([place])    a. 03/2015 CTA: acute bilat PE.  Marland Kitchen Chest pain    a. 2015 Abnl MV;  b. 2015 Cath: nl cors.  . Chronic diastolic CHF (congestive heart failure) ([place])    a. 03/2015 Echo: EF 55-65%, poor windows, mildly dil RV.  Marland Kitchen DVT (deep venous thrombosis) ([place])    a. 03/2015 U/S: occlusive DVT w/in the distal aspect of the Left fem vein through the L popliteal vein.  Marland Kitchen Dysrhythmia    PAF  . GERD (gastroesophageal reflux disease)   . Headache   . Hypertension   . Hypertensive heart disease   . Obesity   .  OSA (obstructive sleep apnea)    uses CPAP nightly  . Primary localized osteoarthritis of left knee    a. 11/2014 s/p L TKA.  . Primary localized osteoarthritis of right knee    a. 02/2014 s/p R Partial Knee arthroplasty.  . Stiffness of left knee    a. 12/2014 s/p manipulation under anesthesia.  . Urine incontinence     Past Surgical History:  Procedure Laterality Date  . APPENDECTOMY    . CARDIAC CATHETERIZATION    . EXAM UNDER ANESTHESIA WITH MANIPULATION OF KNEE Left 05/30/2015   Procedure: EXAM UNDER ANESTHESIA WITH MANIPULATION OF LEFT KNEE;  Surgeon: Marchia Bond, MD;  Location: [place];  Service: Orthopedics;  Laterality: Left;  . I&D KNEE WITH POLY EXCHANGE Left 10/30/2015   Procedure: IRRIGATION AND DEBRIDEMENT KNEE WITH POLY EXCHANGE PLACE SPACERS;   Surgeon: Frederik Pear, MD;  Location: [place];  Service: Orthopedics;  Laterality: Left;  OFF ELIQUIST 3 DAYS  . IVC FILTER PLACEMENT ([facility] HX)  04/13/15  . JOINT REPLACEMENT    . KNEE ARTHROSCOPY Left 08/08/2015   Procedure: LEFT KNEE MANIPULATION WITH ARTHROSCOPIC LYSIS OF ADHESIONS AND ASPIRATION;  Surgeon: Marchia Bond, MD;  Location: [place];  Service: Orthopedics;  Laterality: Left;  . KNEE CLOSED REDUCTION Left 01/20/2015   Procedure: LEFT KNEE MANIPULATION;  Surgeon: Marchia Bond, MD;  Location: [place];  Service: Orthopedics;  Laterality: Left;  . LEFT HEART CATHETERIZATION WITH CORONARY ANGIOGRAM N/A 01/12/2014   Procedure: LEFT HEART CATHETERIZATION WITH CORONARY ANGIOGRAM;  Surgeon: Jettie Booze, MD;  Location: [facility] CATH LAB;  Service: Cardiovascular;  Laterality: N/A;  . ORTHOPEDIC SURGERY Left    arthroscopy x3  . PARTIAL KNEE ARTHROPLASTY Right 02/25/2014   Procedure: RIGHT KNEE ARTHROPLASTY CONDYLE AND PLATEAU MEDIAL COMPARTMENT ;  Surgeon: Johnny Bridge, MD;  Location: [place];  Service: Orthopedics;  Laterality: Right;  . PERIPHERAL VASCULAR CATHETERIZATION N/A 03/29/2015   Procedure: IVC Filter Insertion, and possible thrombectomy;  Surgeon: Katha Cabal, MD;  Location: [place] CV LAB;  Service: Cardiovascular;  Laterality: N/A;  . TOTAL KNEE ARTHROPLASTY  12/06/2014   Procedure: TOTAL KNEE ARTHROPLASTY;  Surgeon: Marchia Bond, MD;  Location: [place];  Service: Orthopedics;;  . TOTAL KNEE REVISION Left 02/08/2016   Procedure: TOTAL KNEE REVISION;  Surgeon: Frederik Pear, MD;  Location: [place];  Service: Orthopedics;  Laterality: Left;    Social History Social History  Substance Use Topics  . Smoking status: Never Smoker  . Smokeless tobacco: Never Used  . Alcohol use Yes     Comment: occassional    Family History Family History  Problem Relation Age of Onset  . Coronary artery disease Mother   . Hyperlipidemia Mother   .  Hypertension Mother   . Arthritis Mother   . Coronary artery disease Father   . Heart disease Paternal Grandmother   . Alzheimer's disease Paternal Grandfather     Allergies  Allergen Reactions  . Penicillins Anaphylaxis and Other (See Comments)    Childhood allergy.  . Vancomycin Other (See Comments)    [place]' syndrome     REVIEW OF SYSTEMS (Negative unless checked)  Constitutional: [] Weight loss  [] Fever  [] Chills Cardiac: [] Chest pain   [] Chest pressure   [] Palpitations   [] Shortness of breath when laying flat   [] Shortness of breath with exertion. Vascular:  [] Pain in legs with walking   [] Pain in legs at rest  [x] History of DVT   [] Phlebitis   [  x]Swelling in legs   [] Varicose veins   [] Non-healing ulcers Pulmonary:   [] Uses home oxygen   [] Productive cough   [] Hemoptysis   [] Wheeze  [] COPD   [] Asthma Neurologic:  [] Dizziness   [] Seizures   [] History of stroke   [] History of TIA  [] Aphasia   [] Vissual changes   [] Weakness or numbness in arm   [] Weakness or numbness in leg Musculoskeletal:   [x] Joint swelling   [x] Joint pain   [] Low back pain Hematologic:  [] Easy bruising  [] Easy bleeding   [] Hypercoagulable state   [] Anemic Gastrointestinal:  [] Diarrhea   [] Vomiting  [] Gastroesophageal reflux/heartburn   [] Difficulty swallowing. Genitourinary:  [] Chronic kidney disease   [] Difficult urination  [] Frequent urination   [] Blood in urine Skin:  [] Rashes   [] Ulcers  Psychological:  [] History of anxiety   []  History of major depression.  Physical Examination  Vitals:   07/15/16 0909  BP: 113/67  Pulse: (!) 56  Resp: 19  Weight: (!) 303 lb (137.4 kg)  Height: 5\' 9"  (1.753 m)   Body mass index is 44.75 kg/m. Gen: WD/WN, NAD Head: [place]/AT, No temporalis wasting.  Ear/Nose/Throat: Hearing grossly intact, nares w/o erythema or drainage Eyes: PER, EOMI, sclera nonicteric.  Neck: Supple, no large masses.   Pulmonary:  Good air movement, no audible wheezing bilaterally, no use of  accessory muscles.  Cardiac: RRR, no JVD Vascular:  Vessel Right Left  Radial Palpable Palpable  PT Palpable Palpable  DP Palpable Palpable  Gastrointestinal: Non-distended. No guarding/no peritoneal signs.  Musculoskeletal: M/S 5/5 throughout. Left knee deformity with an incision that appears CD&I.  Neurologic: CN 2-12 intact. Symmetrical.  Speech is fluent. Motor exam as listed above. Psychiatric: Judgment intact, Mood & affect appropriate for pt's clinical situation. Dermatologic: No rashes or ulcers noted.  No changes consistent with cellulitis. Lymph : No lichenification or skin changes of chronic lymphedema.  CBC Lab Results  Component Value Date   WBC 8.6 03/21/2016   HGB 9.9 (L) 03/21/2016   HCT 31.6 (L) 03/21/2016   MCV 70.6 (L) 03/21/2016   PLT 341 03/21/2016    BMET    Component Value Date/Time   NA 140 03/21/2016 0956   K 4.4 03/21/2016 0956   CL 112 (H) 03/21/2016 0956   CO2 27 03/21/2016 0956   GLUCOSE 102 (H) 03/21/2016 0956   BUN 25 (H) 03/21/2016 0956   CREATININE 1.23 03/21/2016 0956   CREATININE 1.27 12/06/2015 1600   CALCIUM 9.1 03/21/2016 0956   GFRNONAA >60 03/21/2016 0956   GFRAA >60 03/21/2016 0956   CrCl cannot be calculated (Patient's most recent lab result is older than the maximum 21 days allowed.).  COAG Lab Results  Component Value Date   INR 1.24 03/21/2016   INR 1.05 02/08/2016   INR 1.10 10/30/2015    Radiology No results found.  Assessment/Plan 1. Deep venous thrombosis (DVT) of left peroneal vein (HCC)  IVC filter was indicated due to to high risk orthopedic surgery.  Especially given the history of PE with the past DVT.  IVC filter removal will be done in late July as he is to be seen by Ortho on August 07, 2016 for his final post op check. Risk and benefits were reviewed the patient.  Indications for the procedure were reviewed.  All questions were answered, the patient agrees to proceed.   I have had a long discussion with  the patient regarding DVT and post phlebitic changes such as swelling and why it  causes symptoms  such as pain.  The patient will wear graduated compression stockings class 1 (20-30 mmHg) on a daily basis a prescription was given. The patient will  beginning wearing the stockings first thing in the morning and removing them in the evening. The patient is instructed specifically not to sleep in the stockings.  In addition, behavioral modification including elevation during the day and avoidance of prolonged dependency will be initiated.    The patient will follow-up with me after filter removal  2. Other acute pulmonary embolism with acute cor pulmonale ([place]) See #1  3. Essential hypertension Continue antihypertensive medications as already ordered, these medications have been reviewed and there are no changes at this time.   4. Gastroesophageal reflux disease without esophagitis Continue PPI  as already ordered, these medications have been reviewed and there are no changes at this time.     Hortencia Pilar, MD  07/15/2016 9:14 AM

## 2016-07-19 ENCOUNTER — Emergency Department (HOSPITAL_COMMUNITY)
Admission: EM | Admit: 2016-07-19 | Discharge: 2016-07-19 | Disposition: A | Discharge status: Home / Self Care | Payer: Managed Care, Other (non HMO) | Attending: Emergency Medicine | Admitting: Emergency Medicine

## 2016-07-19 ENCOUNTER — Emergency Department (HOSPITAL_COMMUNITY): Payer: Managed Care, Other (non HMO)

## 2016-07-19 DIAGNOSIS — R0602 Shortness of breath: Secondary | ICD-10-CM | POA: Diagnosis present

## 2016-07-19 DIAGNOSIS — I5032 Chronic diastolic (congestive) heart failure: Secondary | ICD-10-CM | POA: Diagnosis not present

## 2016-07-19 DIAGNOSIS — I110 Hypertensive heart disease with heart failure: Secondary | ICD-10-CM | POA: Insufficient documentation

## 2016-07-19 DIAGNOSIS — Z86718 Personal history of other venous thrombosis and embolism: Secondary | ICD-10-CM | POA: Insufficient documentation

## 2016-07-19 DIAGNOSIS — I480 Paroxysmal atrial fibrillation: Secondary | ICD-10-CM | POA: Insufficient documentation

## 2016-07-19 DIAGNOSIS — Z79899 Other long term (current) drug therapy: Secondary | ICD-10-CM | POA: Insufficient documentation

## 2016-07-19 DIAGNOSIS — R0600 Dyspnea, unspecified: Secondary | ICD-10-CM | POA: Insufficient documentation

## 2016-07-19 LAB — BASIC METABOLIC PANEL
Anion gap: 5 (ref 5–15)
BUN: 16 mg/dL (ref 6–20)
CO2: 23 mmol/L (ref 22–32)
Calcium: 8.8 mg/dL — ABNORMAL LOW (ref 8.9–10.3)
Chloride: 107 mmol/L (ref 101–111)
Creatinine, Ser: 1.63 mg/dL — ABNORMAL HIGH (ref 0.61–1.24)
GFR calc Af Amer: 53 mL/min — ABNORMAL LOW (ref >60)
GFR calc non Af Amer: 46 mL/min — ABNORMAL LOW (ref >60)
Glucose, Bld: 106 mg/dL — ABNORMAL HIGH (ref 65–99)
Potassium: 4 mmol/L (ref 3.5–5.1)
Sodium: 135 mmol/L (ref 135–145)

## 2016-07-19 LAB — CBC WITH DIFFERENTIAL/PLATELET
Basophils Absolute: 0 10*3/uL (ref 0.0–0.1)
Basophils Relative: 0 %
Eosinophils Absolute: 0.1 10*3/uL (ref 0.0–0.7)
Eosinophils Relative: 2 %
HCT: 35 % — ABNORMAL LOW (ref 39.0–52.0)
Hemoglobin: 10.1 g/dL — ABNORMAL LOW (ref 13.0–17.0)
Lymphocytes Relative: 25 %
Lymphs Abs: 1.8 10*3/uL (ref 0.7–4.0)
MCH: 20.7 pg — ABNORMAL LOW (ref 26.0–34.0)
MCHC: 28.9 g/dL — ABNORMAL LOW (ref 30.0–36.0)
MCV: 71.6 fL — ABNORMAL LOW (ref 78.0–100.0)
Monocytes Absolute: 0.6 10*3/uL (ref 0.1–1.0)
Monocytes Relative: 8 %
Neutro Abs: 4.8 10*3/uL (ref 1.7–7.7)
Neutrophils Relative %: 65 %
Platelets: 251 10*3/uL (ref 150–400)
RBC: 4.89 MIL/uL (ref 4.22–5.81)
RDW: 18.1 % — ABNORMAL HIGH (ref 11.5–15.5)
WBC: 7.3 10*3/uL (ref 4.0–10.5)

## 2016-07-19 LAB — TROPONIN I: Troponin I: <0.03 ng/mL (ref <0.03)

## 2016-07-19 LAB — BRAIN NATRIURETIC PEPTIDE: B Natriuretic Peptide: 77.5 pg/mL (ref 0.0–100.0)

## 2016-07-19 NOTE — ED Triage Notes (Signed)
Patient comes in per [facility] with sob for 4-5 days. Worse with exertion. Patient had episode last night around 2100 of palpations, dizziness, blurred vision, and remaining sob. States he was shaky and not his norm. Hx of afib. Currently SR. Compliant with meds. Not nauseous or diaphoretic. 18 LH. No meds given. EMS v/s 130/60, 60 HR, 145 cbg, 18 RR, 98% 2L [place].

## 2016-07-19 NOTE — ED Provider Notes (Signed)
[facility] DEPT Provider Note   CSN: [id number] Arrival date & time: 07/19/16  1303   By signing my name below, I, Evelene Croon, attest that this documentation has been prepared under the direction and in the presence of Virgel Manifold, MD . Electronically Signed: Evelene Croon, Scribe. 07/19/2016. 1:21 PM.   History   Chief Complaint Chief Complaint  Patient presents with  . Shortness of Breath    The history is provided by the patient. No language interpreter was used.    HPI Comments:  Richard [name] is a 55 y.o. male with a history of HTN, CHF, who presents to the Emergency Department via EMS complaining of SOB x 4-5 days worse with exertion. He reports associated fatigue and a 15 min episode of palpitations last night. He describes  racing and fluttering sensation last night; no palpitations today. Pt has a h/o AFIB and is compliant with meds. He states he feels drunk but notes he does not drink. Pt also states he feels nervous, has a tingling sensation to his bilateral hands, and is extremely thirsty. He denies CP and acute swelling to his lower extremities. No cravings for ice or baking soda. No alleviating factors noted. Pt is currently on eliquis. No recent changes to daily meds.  CBG 145 en route.    Past Medical History:  Diagnosis Date  . Arthritis   . Arthrofibrosis of total knee arthroplasty ([place]) 05/30/2015  . Bilateral pulmonary embolism ([place])    a. 03/2015 CTA: acute bilat PE.  Marland Kitchen Chest pain    a. 2015 Abnl MV;  b. 2015 Cath: nl cors.  . Chronic diastolic CHF (congestive heart failure) ([place])    a. 03/2015 Echo: EF 55-65%, poor windows, mildly dil RV.  Marland Kitchen DVT (deep venous thrombosis) ([place])    a. 03/2015 U/S: occlusive DVT w/in the distal aspect of the Left fem vein through the L popliteal vein.  Marland Kitchen Dysrhythmia    PAF  . GERD (gastroesophageal reflux disease)   . Headache   . Hypertension   . Hypertensive heart disease   . Obesity   . OSA (obstructive sleep  apnea)    uses CPAP nightly  . Primary localized osteoarthritis of left knee    a. 11/2014 s/p L TKA.  . Primary localized osteoarthritis of right knee    a. 02/2014 s/p R Partial Knee arthroplasty.  . Stiffness of left knee    a. 12/2014 s/p manipulation under anesthesia.  . Urine incontinence     Patient Active Problem List   Diagnosis Date Noted  . Infection of total left knee replacement ([place]) 10/30/2015  . Arthrofibrosis of total knee arthroplasty ([place]) 05/30/2015  . PAF (paroxysmal atrial fibrillation) ([place]) 03/30/2015  . Peroneal DVT (deep venous thrombosis) ([place])   . Pulmonary embolism ([place]) 03/27/2015  . Hypertension 08/17/2011  . LVH (left ventricular hypertrophy) 08/17/2011  . GERD (gastroesophageal reflux disease) 08/17/2011  . Morbid obesity ([place]) 08/16/2011  . OSA (obstructive sleep apnea) 08/16/2011    Past Surgical History:  Procedure Laterality Date  . APPENDECTOMY    . CARDIAC CATHETERIZATION    . EXAM UNDER ANESTHESIA WITH MANIPULATION OF KNEE Left 05/30/2015   Procedure: EXAM UNDER ANESTHESIA WITH MANIPULATION OF LEFT KNEE;  Surgeon: Marchia Bond, MD;  Location: [place];  Service: Orthopedics;  Laterality: Left;  . I&D KNEE WITH POLY EXCHANGE Left 10/30/2015   Procedure: IRRIGATION AND DEBRIDEMENT KNEE WITH POLY EXCHANGE PLACE SPACERS;  Surgeon: Frederik Pear, MD;  Location: [place];  Service: Orthopedics;  Laterality: Left;  OFF ELIQUIST 3 DAYS  . IVC FILTER PLACEMENT ([facility] HX)  04/13/15  . JOINT REPLACEMENT    . KNEE ARTHROSCOPY Left 08/08/2015   Procedure: LEFT KNEE MANIPULATION WITH ARTHROSCOPIC LYSIS OF ADHESIONS AND ASPIRATION;  Surgeon: Marchia Bond, MD;  Location: [place];  Service: Orthopedics;  Laterality: Left;  . KNEE CLOSED REDUCTION Left 01/20/2015   Procedure: LEFT KNEE MANIPULATION;  Surgeon: Marchia Bond, MD;  Location: [place];  Service: Orthopedics;  Laterality: Left;  . LEFT HEART CATHETERIZATION WITH CORONARY ANGIOGRAM N/A  01/12/2014   Procedure: LEFT HEART CATHETERIZATION WITH CORONARY ANGIOGRAM;  Surgeon: Jettie Booze, MD;  Location: [facility] C[facility] LAB;  Service: Cardiovascular;  Laterality: N/A;  . ORTHOPEDIC SURGERY Left    arthroscopy x3  . PARTIAL KNEE ARTHROPLASTY Right 02/25/2014   Procedure: RIGHT KNEE ARTHROPLASTY CONDYLE AND PLATEAU MEDIAL COMPARTMENT ;  Surgeon: Johnny Bridge, MD;  Location: [place];  Service: Orthopedics;  Laterality: Right;  . PERIPHERAL VASCULAR CATHETERIZATION N/A 03/29/2015   Procedure: IVC Filter Insertion, and possible thrombectomy;  Surgeon: Katha Cabal, MD;  Location: [place] CV LAB;  Service: Cardiovascular;  Laterality: N/A;  . TOTAL KNEE ARTHROPLASTY  12/06/2014   Procedure: TOTAL KNEE ARTHROPLASTY;  Surgeon: Marchia Bond, MD;  Location: [place];  Service: Orthopedics;;  . TOTAL KNEE REVISION Left 02/08/2016   Procedure: TOTAL KNEE REVISION;  Surgeon: Frederik Pear, MD;  Location: [place];  Service: Orthopedics;  Laterality: Left;       Home Medications    Prior to Admission medications   Medication Sig Start Date End Date Taking? Authorizing Provider  acetaminophen (TYLENOL) 650 MG CR tablet Take 1,300 mg by mouth every 8 (eight) hours as needed for pain.    [provider]  apixaban (ELIQUIS) 5 MG TABS tablet Take 5 mg by mouth 2 (two) times daily.    [provider]  baclofen (LIORESAL) 10 MG tablet TAKE 1-2 TABLETS (10-20 MG TOTAL) BY MOUTH 3 (THREE) TIMES DAILY AS NEEDED FOR MUSCLE SPASMS. 05/06/16   Jearld Fenton, NP  bismuth subsalicylate (PEPTO-BISMOL) 262 MG chewable tablet Chew 2 tablets (524 mg total) by mouth 4 (four) times daily -  before meals and at bedtime. Patient taking differently: Chew 524 mg by mouth 3 (three) times daily as needed for indigestion.  11/14/15   Jearld Fenton, NP  esomeprazole (NEXIUM) 40 MG capsule Take 40 mg by mouth daily as needed (for acid reflux).     [provider]    flecainide (TAMBOCOR) 50 MG tablet Take 1 tablet (50 mg total) by mouth 2 (two) times daily. 11/13/15   Dunn, Areta Haber, PA-C  furosemide (LASIX) 40 MG tablet TAKE 1 TABLET (40 MG TOTAL) BY MOUTH DAILY. 02/06/16   Jearld Fenton, NP  metoprolol succinate (TOPROL-XL) 25 MG 24 hr tablet Take 1 tablet (25 mg total) by mouth 2 (two) times daily. Take with or immediately following a meal. 11/13/15   Wellington Hampshire, MD  oxyCODONE-acetaminophen (ROXICET) 5-325 MG tablet Take 1 tablet by mouth every 4 (four) hours as needed. 02/08/16   Leighton Parody, PA-C  predniSONE (DELTASONE) 10 MG tablet TAKE 3 TABS ON DAYS 1-2, TAKE 2 TABS ON DAYS 3-4, TAKE 1 TAB ON DAYS 5-6 05/28/16   [provider]  topiramate (TOPAMAX) 25 MG tablet TAKE 2 TABLETS (50 MG TOTAL) BY MOUTH NIGHTLY. 07/06/16   [provider]  traMADol (  ULTRAM) 50 MG tablet Take 2 tablets (100 mg total) by mouth every 12 (twelve) hours as needed. 07/08/16   Lucille Passy, MD    Family History Family History  Problem Relation Age of Onset  . Coronary artery disease Mother   . Hyperlipidemia Mother   . Hypertension Mother   . Arthritis Mother   . Coronary artery disease Father   . Heart disease Paternal Grandmother   . Alzheimer's disease Paternal Grandfather     Social History Social History  Substance Use Topics  . Smoking status: Never Smoker  . Smokeless tobacco: Never Used  . Alcohol use Yes     Comment: occassional     Allergies   Penicillins and Vancomycin   Review of Systems Review of Systems  Constitutional: Positive for fatigue. Negative for chills and fever.  Respiratory: Positive for shortness of breath.   Cardiovascular: Positive for palpitations. Negative for chest pain and leg swelling.  Endocrine: Positive for polydipsia.  Neurological:       +Paresthesia  Psychiatric/Behavioral: The patient is nervous/anxious.   All other systems reviewed and are negative.    Physical Exam Updated Vital  Signs BP 120/65   Pulse (!) 55   Temp 98 F (36.7 C) (Oral)   Resp 15   Ht 5\' 9"  (1.753 m)   Wt (!) 303 lb (137.4 kg)   SpO2 97%   BMI 44.75 kg/m   Physical Exam  Constitutional: He is oriented to person, place, and time. He appears well-developed and well-nourished. No distress.  Laying in bed. NAD. Obese.   HENT:  Head: Normocephalic and atraumatic.  Eyes: Conjunctivae are normal.  Cardiovascular: Normal rate and regular rhythm.   Pulmonary/Chest: Effort normal and breath sounds normal. No respiratory distress.  Abdominal: He exhibits no distension.  Musculoskeletal:  Mild swelling L knee.   Neurological: He is alert and oriented to person, place, and time. No cranial nerve deficit or sensory deficit. Coordination normal.  Skin: Skin is warm and dry.  Psychiatric: He has a normal mood and affect.  Nursing note and vitals reviewed.    ED Treatments / Results  DIAGNOSTIC STUDIES:  Oxygen Saturation is 97% on RA, normal by my interpretation.    COORDINATION OF CARE:  1:17 PM Discussed treatment plan with pt at bedside and pt agreed to plan.  Labs (all labs ordered are listed, but only abnormal results are displayed) Labs Reviewed  CBC WITH DIFFERENTIAL/PLATELET - Abnormal; Notable for the following:       Result Value   Hemoglobin 10.1 (*)    HCT 35.0 (*)    MCV 71.6 (*)    MCH 20.7 (*)    MCHC 28.9 (*)    RDW 18.1 (*)    All other components within normal limits  BASIC METABOLIC PANEL - Abnormal; Notable for the following:    Glucose, Bld 106 (*)    Creatinine, Ser 1.63 (*)    Calcium 8.8 (*)    GFR calc non Af Amer 46 (*)    GFR calc Af Amer 53 (*)    All other components within normal limits  TROPONIN I  BRAIN NATRIURETIC PEPTIDE    EKG  EKG Interpretation  Date/Time:  Friday July 19 2016 13:30:28 EDT Ventricular Rate:  55 PR Interval:    QRS Duration: 106 QT Interval:  427 QTC Calculation: 409 R Axis:   -64 Text Interpretation:  Sinus rhythm  LAD, consider left anterior fascicular block Confirmed by Wilson Singer  MD, Annie Main [id number]) on 07/19/2016 4:22:15 PM       Radiology Dg Chest 2 View  Result Date: 07/19/2016 CLINICAL DATA:  Dyspnea for 2 days with palpitations. EXAM: CHEST  2 VIEW COMPARISON:  Chest CT and chest x-ray 03/21/2016 FINDINGS: Cardiopericardial silhouette is at upper limits of normal for size. The lungs are clear without focal pneumonia, edema, pneumothorax or pleural effusion. The visualized bony structures of the thorax are intact. Telemetry leads overlie the chest. IMPRESSION: 1. No acute cardiopulmonary findings. 2. Mediastinal lymphadenopathy noted on previous CT scan not evident by x-ray. Electronically Signed   By: Misty Stanley M.D.   On: 07/19/2016 14:07    Procedures Procedures (including critical care time)  Medications Ordered in ED Medications - No data to display   Initial Impression / Assessment and Plan / ED Course  I have reviewed the triage vital signs and the nursing notes.  Pertinent labs & imaging results that were available during my care of the patient were reviewed by me and considered in my medical decision making (see chart for details).     56yM with dyspnea. I'm not sure why. Seems atypical for ACS. Denies CP. Troponin normal. EKG w/o acute changes. Normal coronaries on cath in 2015. Sounds clear on exam. BNP normal. CXR clear. Anemic but this appears stable. Hx of afib but has been in sinus in the ED. Not tachycardic. Not hypoxic. Some swelling around L knee but has had multiple operations and he says this is chronic and he is also on eliquis. Hx of GERD but hasn't had symptoms like this previously from it.   Anxiety? Episodes of afib? There doesn't appear to be an emergent cause of his symptoms. Return precautions discussed. At this point I think follow back up with PCP is the most appropriate course of action.    Final Clinical Impressions(s) / ED Diagnoses   Final diagnoses:    Dyspnea, unspecified type    New Prescriptions New Prescriptions   No medications on file   I personally preformed the services scribed in my presence. The recorded information has been reviewed is accurate. Virgel Manifold, MD.     Virgel Manifold, MD 07/28/16 [phone]

## 2016-07-19 NOTE — ED Notes (Signed)
Patient to xray.

## 2016-08-08 ENCOUNTER — Other Ambulatory Visit (INDEPENDENT_AMBULATORY_CARE_PROVIDER_SITE_OTHER): Payer: Self-pay

## 2016-08-08 ENCOUNTER — Other Ambulatory Visit: Payer: Self-pay | Admitting: Internal Medicine

## 2016-08-12 MED ORDER — CLINDAMYCIN PHOSPHATE 300 MG/50ML IV SOLN
300.0000 mg | Freq: Once | INTRAVENOUS | Status: AC
  Administered 2016-08-13: 300 mg via INTRAVENOUS

## 2016-08-13 ENCOUNTER — Ambulatory Visit
Admission: RE | Admit: 2016-08-13 | Discharge: 2016-08-13 | Disposition: A | Discharge status: Home / Self Care | Payer: Managed Care, Other (non HMO) | Source: Ambulatory Visit | Attending: Vascular Surgery | Admitting: Vascular Surgery

## 2016-08-13 ENCOUNTER — Encounter
Admission: RE | Disposition: A | Discharge status: Home / Self Care | Payer: Self-pay | Source: Ambulatory Visit | Attending: Vascular Surgery

## 2016-08-13 DIAGNOSIS — Z9889 Other specified postprocedural states: Secondary | ICD-10-CM | POA: Insufficient documentation

## 2016-08-13 DIAGNOSIS — R51 Headache: Secondary | ICD-10-CM | POA: Diagnosis not present

## 2016-08-13 DIAGNOSIS — Z820 Family history of epilepsy and other diseases of the nervous system: Secondary | ICD-10-CM | POA: Insufficient documentation

## 2016-08-13 DIAGNOSIS — I110 Hypertensive heart disease with heart failure: Secondary | ICD-10-CM | POA: Diagnosis not present

## 2016-08-13 DIAGNOSIS — M170 Bilateral primary osteoarthritis of knee: Secondary | ICD-10-CM | POA: Insufficient documentation

## 2016-08-13 DIAGNOSIS — Z96653 Presence of artificial knee joint, bilateral: Secondary | ICD-10-CM | POA: Insufficient documentation

## 2016-08-13 DIAGNOSIS — Z881 Allergy status to other antibiotic agents status: Secondary | ICD-10-CM | POA: Diagnosis not present

## 2016-08-13 DIAGNOSIS — K219 Gastro-esophageal reflux disease without esophagitis: Secondary | ICD-10-CM | POA: Diagnosis not present

## 2016-08-13 DIAGNOSIS — E669 Obesity, unspecified: Secondary | ICD-10-CM | POA: Insufficient documentation

## 2016-08-13 DIAGNOSIS — Z452 Encounter for adjustment and management of vascular access device: Secondary | ICD-10-CM | POA: Diagnosis not present

## 2016-08-13 DIAGNOSIS — I5032 Chronic diastolic (congestive) heart failure: Secondary | ICD-10-CM | POA: Diagnosis not present

## 2016-08-13 DIAGNOSIS — G4733 Obstructive sleep apnea (adult) (pediatric): Secondary | ICD-10-CM | POA: Diagnosis not present

## 2016-08-13 DIAGNOSIS — I82492 Acute embolism and thrombosis of other specified deep vein of left lower extremity: Secondary | ICD-10-CM | POA: Insufficient documentation

## 2016-08-13 DIAGNOSIS — I4891 Unspecified atrial fibrillation: Secondary | ICD-10-CM | POA: Insufficient documentation

## 2016-08-13 DIAGNOSIS — Z6841 Body Mass Index (BMI) 40.0 and over, adult: Secondary | ICD-10-CM | POA: Insufficient documentation

## 2016-08-13 DIAGNOSIS — Z86718 Personal history of other venous thrombosis and embolism: Secondary | ICD-10-CM | POA: Diagnosis not present

## 2016-08-13 DIAGNOSIS — Z8261 Family history of arthritis: Secondary | ICD-10-CM | POA: Diagnosis not present

## 2016-08-13 DIAGNOSIS — Z880 Allergy status to penicillin: Secondary | ICD-10-CM | POA: Insufficient documentation

## 2016-08-13 DIAGNOSIS — I2609 Other pulmonary embolism with acute cor pulmonale: Secondary | ICD-10-CM | POA: Insufficient documentation

## 2016-08-13 DIAGNOSIS — Z8249 Family history of ischemic heart disease and other diseases of the circulatory system: Secondary | ICD-10-CM | POA: Insufficient documentation

## 2016-08-13 HISTORY — PX: IVC FILTER REMOVAL: CATH118246

## 2016-08-13 SURGERY — IVC FILTER REMOVAL
Anesthesia: Moderate Sedation

## 2016-08-13 MED ORDER — MIDAZOLAM HCL 2 MG/2ML IJ SOLN
INTRAMUSCULAR | Status: DC | PRN
  Administered 2016-08-13: 1 mg via INTRAVENOUS
  Administered 2016-08-13: 2 mg via INTRAVENOUS

## 2016-08-13 MED ORDER — FENTANYL CITRATE (PF) 100 MCG/2ML IJ SOLN
INTRAMUSCULAR | Status: AC
  Filled 2016-08-13: qty 2

## 2016-08-13 MED ORDER — HEPARIN (PORCINE) IN NACL 2-0.9 UNIT/ML-% IJ SOLN
INTRAMUSCULAR | Status: AC
  Filled 2016-08-13: qty 500

## 2016-08-13 MED ORDER — MIDAZOLAM HCL 5 MG/5ML IJ SOLN
INTRAMUSCULAR | Status: AC
  Filled 2016-08-13: qty 5

## 2016-08-13 MED ORDER — LIDOCAINE HCL (PF) 1 % IJ SOLN
INTRAMUSCULAR | Status: AC
  Filled 2016-08-13: qty 30

## 2016-08-13 MED ORDER — FENTANYL CITRATE (PF) 100 MCG/2ML IJ SOLN
INTRAMUSCULAR | Status: DC | PRN
  Administered 2016-08-13: 100 ug via INTRAVENOUS

## 2016-08-13 MED ORDER — IOPAMIDOL (ISOVUE-300) INJECTION 61%
INTRAVENOUS | Status: DC | PRN
  Administered 2016-08-13: 15 mL via INTRA_ARTERIAL

## 2016-08-13 MED ORDER — SODIUM CHLORIDE 0.9 % IV SOLN
INTRAVENOUS | Status: DC
  Administered 2016-08-13: 07:00:00 via INTRAVENOUS

## 2016-08-13 MED ORDER — CLINDAMYCIN PHOSPHATE 300 MG/50ML IV SOLN
INTRAVENOUS | Status: AC
  Administered 2016-08-13: 300 mg via INTRAVENOUS
  Filled 2016-08-13: qty 50

## 2016-08-13 SURGICAL SUPPLY — 7 items
NEEDLE ENTRY 21GA 7CM ECHOTIP (NEEDLE) ×2 IMPLANT
PACK ANGIOGRAPHY (CUSTOM PROCEDURE TRAY) ×2 IMPLANT
SET INTRO CAPELLA COAXIAL (SET/KITS/TRAYS/PACK) ×2 IMPLANT
SET VENACAVA FILTER RETRIEVAL (MISCELLANEOUS) ×2 IMPLANT
TOWEL OR 17X26 4PK STRL BLUE (TOWEL DISPOSABLE) ×2 IMPLANT
WIRE J 3MM .035X145CM (WIRE) ×2 IMPLANT
WIRE NITINOL .018 (WIRE) ×2 IMPLANT

## 2016-08-13 NOTE — Op Note (Signed)
  OPERATIVE NOTE   PRE-OPERATIVE DIAGNOSIS: DVT with PE  POST-OPERATIVE DIAGNOSIS: Same  PROCEDURE: 1. Retrieval of IVC Filter 2. Inferior Vena Cavagram  SURGEON: Katha Cabal, M.D.  ANESTHESIA:  Conscious sedation was administered under my direct supervision by the interventional radiology RN. IV Versed plus fentanyl were utilized. Continuous ECG, pulse oximetry and blood pressure was monitored throughout the entire procedure. Conscious sedation was for a total of 25 minutes.  ESTIMATED BLOOD LOSS: Minimal cc  FINDING(S):inferior vena cava is widely patent filter is in place in good position. Filter is removed without incident  SPECIMEN(S):  IVC filter intact  INDICATIONS:   Richard [name] is a 56 y.o. male who presents with DVT and PE. The patient has now tolerated anticoagulation for several months. Therefore, the IVC filter is recommended to be removed. The risks and benefits were reviewed with the patient all questions were answered and they agreed to proceed with IVC filter retrieval. Oral anticoagulation will be continued.  DESCRIPTION: After obtaining full informed written consent, the patient was brought back to the Special Procedure Suite and placed in the supine position.  The patient received IV antibiotics prior to induction.  After obtaining adequate sedation, the patient was prepped and draped in the standard fashion and appropriate time out is called.     Ultrasound was placed in a sterile sleeve.The right neck was then imaged with ultrasound.   Jugular vein was identified it is echolucent and homogeneous indicating patency. 1% lidocaine is then infiltrated under ultrasound visualization and subsequently a Seldinger needle is inserted under real-time ultrasound guidance.  J-wire is then advanced into the inferior vena cava under fluoroscopic guidance. With the tip of the sheath at the confluence of the iliac veins inferior vena caval imaging is performed.  After  review of the image the sheath is repositioned to above the filter and the snares introduced. Snares opened and the hook is secured without difficulty. The filter is then collapsed within the sheath and removed without difficulty.  Sheath is removed by pressures held the patient tolerated the procedure well and there were no immediate complications.  Interpretation: inferior vena cava is widely patent filter is in place in good position. Filter is removed without incident.     COMPLICATIONS: None  CONDITION: Carlynn Purl, M.D. [facility] Office: [phone]   08/13/2016, 8:34 AM

## 2016-08-13 NOTE — H&P (Signed)
[facility] History & Physical Update  The patient was interviewed and re-examined.  The patient's previous History and Physical has been reviewed and is unchanged.  There is no change in the plan of care. We plan to proceed with the scheduled procedure.  Hortencia Pilar, MD  08/13/2016, 8:01 AM

## 2016-08-19 ENCOUNTER — Ambulatory Visit (INDEPENDENT_AMBULATORY_CARE_PROVIDER_SITE_OTHER): Payer: Managed Care, Other (non HMO) | Admitting: Internal Medicine

## 2016-08-19 ENCOUNTER — Encounter: Payer: Self-pay | Admitting: Internal Medicine

## 2016-08-19 VITALS — BP 122/74 | HR 60 | Temp 98.3°F | Wt 301.0 lb

## 2016-08-19 DIAGNOSIS — B9789 Other viral agents as the cause of diseases classified elsewhere: Secondary | ICD-10-CM | POA: Diagnosis not present

## 2016-08-19 DIAGNOSIS — J069 Acute upper respiratory infection, unspecified: Secondary | ICD-10-CM

## 2016-08-19 MED ORDER — HYDROCODONE-HOMATROPINE 5-1.5 MG/5ML PO SYRP: 5.0000 mL | ORAL_SOLUTION | Freq: Three times a day (TID) | ORAL | 0 refills | Status: DC | PRN

## 2016-08-19 NOTE — Progress Notes (Signed)
HPI  Pt presents to the clinic today with c/o runny nose, nasal congestion and cough. He reports this started 1 week ago. He is blowing clear mucous out of his nose. The cough is productive of yellow mucous. He denies ear fullness, sore throat, fever, chills or body aches. He has tried Mucinex and Benadryl with some relief. He has no history of allergies. He has had sick contacts.   Review of Systems     Past Medical History:  Diagnosis Date  . Arthritis   . Arthrofibrosis of total knee arthroplasty ([place]) 05/30/2015  . Atrial fibrillation ([place])   . Bilateral pulmonary embolism ([place])    a. 03/2015 CTA: acute bilat PE.  Marland Kitchen Chest pain    a. 2015 Abnl MV;  b. 2015 Cath: nl cors.  . Chronic diastolic CHF (congestive heart failure) ([place])    a. 03/2015 Echo: EF 55-65%, poor windows, mildly dil RV.  Marland Kitchen Coronary artery disease   . DVT (deep venous thrombosis) ([place])    a. 03/2015 U/S: occlusive DVT w/in the distal aspect of the Left fem vein through the L popliteal vein.  Marland Kitchen Dysrhythmia    PAF  . GERD (gastroesophageal reflux disease)   . Headache   . Hypertension   . Hypertensive heart disease   . Obesity   . OSA (obstructive sleep apnea)    uses CPAP nightly  . Primary localized osteoarthritis of left knee    a. 11/2014 s/p L TKA.  . Primary localized osteoarthritis of right knee    a. 02/2014 s/p R Partial Knee arthroplasty.  . Stiffness of left knee    a. 12/2014 s/p manipulation under anesthesia.  . Urine incontinence     Family History  Problem Relation Age of Onset  . Coronary artery disease Mother   . Hyperlipidemia Mother   . Hypertension Mother   . Arthritis Mother   . Coronary artery disease Father   . Heart disease Paternal Grandmother   . Alzheimer's disease Paternal Grandfather     Social History   Social History  . Marital status: Married    Spouse name: N/A  . Number of children: N/A  . 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    Social History Main Topics  . Smoking status: Never Smoker  . Smokeless tobacco: Never Used  . Alcohol use Yes     Comment: occassional  . Drug use: No  . Sexual activity: Yes   Other Topics Concern  . Not on file   Social History Narrative  . No narrative on file    Allergies  Allergen Reactions  . Penicillins Anaphylaxis and Other (See Comments)    Childhood allergy. Pt unsure of reaction Has patient had a PCN reaction causing immediate rash, facial/tongue/throat swelling, SOB or lightheadedness with hypotension: Unknown Has patient had a PCN reaction causing severe rash involving mucus membranes or skin necrosis: Unknown Has patient had a PCN reaction that required hospitalization: Unknown Has patient had a PCN reaction occurring within the last 10 years: No If all of the above answers are "NO", then may proceed with Cephalosporin use.   . Vancomycin Other (See Comments)    Red Mans' syndrome     Constitutional:  Denies headache, fatigue, fever or abrupt weight changes.  HEENT:  Positive runny nose, nasal congestion. Denies eye redness, ear pain, ringing in the ears, wax buildup or sore throat. Respiratory: Positive cough. Denies difficulty breathing or shortness of breath.  Cardiovascular: Denies chest pain, chest tightness, palpitations or swelling in the hands or feet.   No other specific complaints in a complete review of systems (except as listed in HPI above).  Objective:  BP 122/74   Pulse 60   Temp 98.3 F (36.8 C) (Oral)   Wt (!) 301 lb (136.5 kg)   SpO2 99%   BMI 44.45 kg/m   General: Appears his stated age, in NAD. HEENT: Head: normal shape and size, no sinus tenderness noted;  Ears: Tm's gray and intact, normal light reflex; Nose: mucosa boggy and moist, septum midline; Throat/Mouth: + PND. Teeth present, mucosa erythematous and moist, no exudate noted, no lesions or ulcerations noted.  Neck:  No adenopathy noted.  Pulmonary/Chest: Normal effort  and positive vesicular breath sounds. No respiratory distress. No wheezes, rales or ronchi noted.       Assessment & Plan:   Viral URI with Cough:  Continue Mucinex Flonase 2 sprays each nostril for 3 days and then as needed. RX for Hycodan for cough  RTC as needed or if symptoms persist. Webb Silversmith, NP

## 2016-08-19 NOTE — Patient Instructions (Signed)
Upper Respiratory Infection, Adult Most upper respiratory infections (URIs) are caused by a virus. A URI affects the nose, throat, and upper air passages. The most common type of URI is often called "the common cold." Follow these instructions at home:  Take medicines only as told by your doctor.  Gargle warm saltwater or take cough drops to comfort your throat as told by your doctor.  Use a warm mist humidifier or inhale steam from a shower to increase air moisture. This may make it easier to breathe.  Drink enough fluid to keep your pee (urine) clear or pale yellow.  Eat soups and other clear broths.  Have a healthy diet.  Rest as needed.  Go back to work when your fever is gone or your doctor says it is okay. ? You may need to stay home longer to avoid giving your URI to others. ? You can also wear a face mask and wash your hands often to prevent spread of the virus.  Use your inhaler more if you have asthma.  Do not use any tobacco products, including cigarettes, chewing tobacco, or electronic cigarettes. If you need help quitting, ask your doctor. Contact a doctor if:  You are getting worse, not better.  Your symptoms are not helped by medicine.  You have chills.  You are getting more short of breath.  You have brown or red mucus.  You have yellow or brown discharge from your nose.  You have pain in your face, especially when you bend forward.  You have a fever.  You have puffy (swollen) neck glands.  You have pain while swallowing.  You have white areas in the back of your throat. Get help right away if:  You have very bad or constant: ? Headache. ? Ear pain. ? Pain in your forehead, behind your eyes, and over your cheekbones (sinus pain). ? Chest pain.  You have long-lasting (chronic) lung disease and any of the following: ? Wheezing. ? Long-lasting cough. ? Coughing up blood. ? A change in your usual mucus.  You have a stiff neck.  You have  changes in your: ? Vision. ? Hearing. ? Thinking. ? Mood. This information is not intended to replace advice given to you by your health care provider. Make sure you discuss any questions you have with your health care provider. Document Released: 06/26/2007 Document Revised: 09/10/2015 Document Reviewed: 04/14/2013 Elsevier Interactive Patient Education  2018 Elsevier Inc.  

## 2016-08-26 ENCOUNTER — Ambulatory Visit (INDEPENDENT_AMBULATORY_CARE_PROVIDER_SITE_OTHER): Payer: Managed Care, Other (non HMO) | Admitting: Vascular Surgery

## 2016-08-26 ENCOUNTER — Encounter (INDEPENDENT_AMBULATORY_CARE_PROVIDER_SITE_OTHER): Payer: Self-pay | Admitting: Vascular Surgery

## 2016-08-26 VITALS — BP 112/58 | HR 57 | Resp 16 | Wt 304.0 lb

## 2016-08-26 DIAGNOSIS — I2609 Other pulmonary embolism with acute cor pulmonale: Secondary | ICD-10-CM

## 2016-08-26 DIAGNOSIS — I82492 Acute embolism and thrombosis of other specified deep vein of left lower extremity: Secondary | ICD-10-CM

## 2016-08-26 DIAGNOSIS — I480 Paroxysmal atrial fibrillation: Secondary | ICD-10-CM

## 2016-08-26 DIAGNOSIS — I10 Essential (primary) hypertension: Secondary | ICD-10-CM | POA: Diagnosis not present

## 2016-08-26 DIAGNOSIS — I82452 Acute embolism and thrombosis of left peroneal vein: Secondary | ICD-10-CM

## 2016-08-26 NOTE — Progress Notes (Signed)
MRN : [id number]  Richard [name] is a 56 y.o. (06/09/1960) male who presents with chief complaint of  Chief Complaint  Patient presents with  . Contact Lens Follow-up    [facility] follow up  .  History of Present Illness: The patient presents to the office for follow up s/p IVC filter removal.  He has no concerns regarding the successful removal.  He is now s/p left Redo TKR secondary to an infected prosthesis. Surgery was in January of 2018.  Surgery seems to have gone well.  The patient notes the left leg continues to be painful with dependency and swells quite a bite.  Symptoms are much better with elevation.  The patient notes minimal edema in the morning which steadily worsens throughout the day.    The patient has not been using compression therapy at this point.  No SOB or pleuritic chest pains.  No cough or hemoptysis.  No blood per rectum or blood in any sputum.  No excessive bruising per the patient.  Current Meds  Medication Sig  . acetaminophen (TYLENOL) 650 MG CR tablet Take 1,300 mg by mouth every 8 (eight) hours as needed for pain.  Marland Kitchen apixaban (ELIQUIS) 5 MG TABS tablet Take 5 mg by mouth 2 (two) times daily.  . baclofen (LIORESAL) 10 MG tablet TAKE 1-2 TABLETS (10-20 MG TOTAL) BY MOUTH 3 (THREE) TIMES DAILY AS NEEDED FOR MUSCLE SPASMS.  Marland Kitchen bismuth subsalicylate (PEPTO-BISMOL) 262 MG chewable tablet Chew 2 tablets (524 mg total) by mouth 4 (four) times daily -  before meals and at bedtime. (Patient taking differently: Chew 524 mg by mouth 3 (three) times daily as needed for indigestion. )  . Docusate Calcium (STOOL SOFTENER PO) Take 1-2 tablets by mouth daily as needed (mild constipation).  Marland Kitchen esomeprazole (NEXIUM) 40 MG capsule Take 40 mg by mouth daily as needed (for acid reflux).   . flecainide (TAMBOCOR) 50 MG tablet Take 1 tablet (50 mg total) by mouth 2 (two) times daily.  . furosemide (LASIX) 40 MG tablet TAKE 1 TABLET (40 MG TOTAL) BY MOUTH DAILY.  Marland Kitchen  HYDROcodone-homatropine (HYCODAN) 5-1.5 MG/5ML syrup Take 5 mLs by mouth every 8 (eight) hours as needed for cough.  . metoprolol succinate (TOPROL-XL) 25 MG 24 hr tablet Take 1 tablet (25 mg total) by mouth 2 (two) times daily. Take with or immediately following a meal.  . traMADol (ULTRAM) 50 MG tablet Take 2 tablets (100 mg total) by mouth every 12 (twelve) hours as needed.    Past Medical History:  Diagnosis Date  . Arthritis   . Arthrofibrosis of total knee arthroplasty ([place]) 05/30/2015  . Atrial fibrillation ([place])   . Bilateral pulmonary embolism ([place])    a. 03/2015 CTA: acute bilat PE.  Marland Kitchen Chest pain    a. 2015 Abnl MV;  b. 2015 Cath: nl cors.  . Chronic diastolic CHF (congestive heart failure) ([place])    a. 03/2015 Echo: EF 55-65%, poor windows, mildly dil RV.  Marland Kitchen Coronary artery disease   . DVT (deep venous thrombosis) ([place])    a. 03/2015 U/S: occlusive DVT w/in the distal aspect of the Left fem vein through the L popliteal vein.  Marland Kitchen Dysrhythmia    PAF  . GERD (gastroesophageal reflux disease)   . Headache   . Hypertension   . Hypertensive heart disease   . Obesity   . OSA (obstructive sleep apnea)    uses CPAP nightly  . Primary localized osteoarthritis of left  knee    a. 11/2014 s/p L TKA.  . Primary localized osteoarthritis of right knee    a. 02/2014 s/p R Partial Knee arthroplasty.  . Stiffness of left knee    a. 12/2014 s/p manipulation under anesthesia.  . Urine incontinence     Past Surgical History:  Procedure Laterality Date  . APPENDECTOMY    . CARDIAC CATHETERIZATION    . EXAM UNDER ANESTHESIA WITH MANIPULATION OF KNEE Left 05/30/2015   Procedure: EXAM UNDER ANESTHESIA WITH MANIPULATION OF LEFT KNEE;  Surgeon: Marchia Bond, MD;  Location: [place];  Service: Orthopedics;  Laterality: Left;  . I&D KNEE WITH POLY EXCHANGE Left 10/30/2015   Procedure: IRRIGATION AND DEBRIDEMENT KNEE WITH POLY EXCHANGE PLACE SPACERS;  Surgeon: Frederik Pear, MD;  Location: [place];  Service:  Orthopedics;  Laterality: Left;  OFF ELIQUIST 3 DAYS  . IVC FILTER PLACEMENT ([facility] HX)  04/13/15  . IVC FILTER REMOVAL N/A 08/13/2016   Procedure: IVC Filter Removal;  Surgeon: Katha Cabal, MD;  Location: [place];  Service: Cardiovascular;  Laterality: N/A;  . JOINT REPLACEMENT    . KNEE ARTHROSCOPY Left 08/08/2015   Procedure: LEFT KNEE MANIPULATION WITH ARTHROSCOPIC LYSIS OF ADHESIONS AND ASPIRATION;  Surgeon: Marchia Bond, MD;  Location: [place];  Service: Orthopedics;  Laterality: Left;  . KNEE CLOSED REDUCTION Left 01/20/2015   Procedure: LEFT KNEE MANIPULATION;  Surgeon: Marchia Bond, MD;  Location: [place];  Service: Orthopedics;  Laterality: Left;  . LEFT HEART CATHETERIZATION WITH CORONARY ANGIOGRAM N/A 01/12/2014   Procedure: LEFT HEART CATHETERIZATION WITH CORONARY ANGIOGRAM;  Surgeon: Jettie Booze, MD;  Location: [facility] C[facility] LAB;  Service: Cardiovascular;  Laterality: N/A;  . ORTHOPEDIC SURGERY Left    arthroscopy x3  . PARTIAL KNEE ARTHROPLASTY Right 02/25/2014   Procedure: RIGHT KNEE ARTHROPLASTY CONDYLE AND PLATEAU MEDIAL COMPARTMENT ;  Surgeon: Johnny Bridge, MD;  Location: [place];  Service: Orthopedics;  Laterality: Right;  . PERIPHERAL VASCULAR CATHETERIZATION N/A 03/29/2015   Procedure: IVC Filter Insertion, and possible thrombectomy;  Surgeon: Katha Cabal, MD;  Location: [place] [facility];  Service: Cardiovascular;  Laterality: N/A;  . TOTAL KNEE ARTHROPLASTY  12/06/2014   Procedure: TOTAL KNEE ARTHROPLASTY;  Surgeon: Marchia Bond, MD;  Location: [place];  Service: Orthopedics;;  . TOTAL KNEE REVISION Left 02/08/2016   Procedure: TOTAL KNEE REVISION;  Surgeon: Frederik Pear, MD;  Location: [place];  Service: Orthopedics;  Laterality: Left;    Social History Social History  Substance Use Topics  . Smoking status: Never Smoker  . Smokeless tobacco: Never Used  . Alcohol use Yes     Comment: occassional     Family History Family History  Problem Relation Age of Onset  . Coronary artery disease Mother   . Hyperlipidemia Mother   . Hypertension Mother   . Arthritis Mother   . Coronary artery disease Father   . Heart disease Paternal Grandmother   . Alzheimer's disease Paternal Grandfather     Allergies  Allergen Reactions  . Penicillins Anaphylaxis and Other (See Comments)    Childhood allergy. Pt unsure of reaction Has patient had a PCN reaction causing immediate rash, facial/tongue/throat swelling, SOB or lightheadedness with hypotension: Unknown Has patient had a PCN reaction causing severe rash involving mucus membranes or skin necrosis: Unknown Has patient had a PCN reaction that required hospitalization: Unknown Has patient had a PCN reaction occurring within the last 10 years: No If all  of the above answers are "NO", then may proceed with Cephalosporin use.   . Vancomycin Other (See Comments)    Red Mans' syndrome     REVIEW OF SYSTEMS (Negative unless checked)  Constitutional: [] Weight loss  [] Fever  [] Chills Cardiac: [] Chest pain   [] Chest pressure   [] Palpitations   [] Shortness of breath when laying flat   [] Shortness of breath with exertion. Vascular:  [] Pain in legs with walking   [] Pain in legs at rest  [x] History of DVT   [] Phlebitis   [x] Swelling in legs   [] Varicose veins   [] Non-healing ulcers Pulmonary:   [] Uses home oxygen   [] Productive cough   [] Hemoptysis   [] Wheeze  [] COPD   [] Asthma Neurologic:  [] Dizziness   [] Seizures   [] History of stroke   [] History of TIA  [] Aphasia   [] Vissual changes   [] Weakness or numbness in arm   [] Weakness or numbness in leg Musculoskeletal:   [x] Joint swelling   [x] Joint pain   [] Low back pain Hematologic:  [] Easy bruising  [] Easy bleeding   [] Hypercoagulable state   [] Anemic Gastrointestinal:  [] Diarrhea   [] Vomiting  [] Gastroesophageal reflux/heartburn   [] Difficulty swallowing. Genitourinary:  [] Chronic kidney disease    [] Difficult urination  [] Frequent urination   [] Blood in urine Skin:  [] Rashes   [] Ulcers  Psychological:  [] History of anxiety   []  History of major depression.  Physical Examination  Vitals:   08/26/16 1320  BP: (!) 112/58  Pulse: (!) 57  Resp: 16  Weight: (!) 304 lb (137.9 kg)   Body mass index is 44.89 kg/m. Gen: WD/WN, NAD Head: [place]/AT, No temporalis wasting.  Ear/Nose/Throat: Hearing grossly intact, nares w/o erythema or drainage Eyes: PER, EOMI, sclera nonicteric.  Neck: Supple, no large masses.   Pulmonary:  Good air movement, no audible wheezing bilaterally, no use of accessory muscles.  Cardiac: RRR, no JVD Vascular:  Severe left venous stasis changes to the leg.  3+ soft pitting edema Vessel Right Left  Radial Palpable Palpable  PT Palpable Palpable  DP Palpable Palpable  Gastrointestinal: Non-distended. No guarding/no peritoneal signs.  Musculoskeletal: M/S 5/5 throughout.  No deformity or atrophy.  Neurologic: CN 2-12 intact. Symmetrical.  Speech is fluent. Motor exam as listed above. Psychiatric: Judgment intact, Mood & affect appropriate for pt's clinical situation. Dermatologic: venous rashes or ulcers noted.  No changes consistent with cellulitis. Lymph : No lichenification or skin changes of chronic lymphedema.  CBC Lab Results  Component Value Date   WBC 7.3 07/19/2016   HGB 10.1 (L) 07/19/2016   HCT 35.0 (L) 07/19/2016   MCV 71.6 (L) 07/19/2016   PLT 251 07/19/2016    BMET    Component Value Date/Time   NA 135 07/19/2016 1333   K 4.0 07/19/2016 1333   CL 107 07/19/2016 1333   CO2 23 07/19/2016 1333   GLUCOSE 106 (H) 07/19/2016 1333   BUN 16 07/19/2016 1333   CREATININE 1.63 (H) 07/19/2016 1333   CREATININE 1.27 12/06/2015 1600   CALCIUM 8.8 (L) 07/19/2016 1333   GFRNONAA 46 (L) 07/19/2016 1333   GFRAA 53 (L) 07/19/2016 1333   CrCl cannot be calculated (Patient's most recent lab result is older than the maximum 21 days  allowed.).  COAG Lab Results  Component Value Date   INR 1.24 03/21/2016   INR 1.05 02/08/2016   INR 1.10 10/30/2015    Radiology No results found.  Assessment/Plan 1. Deep venous thrombosis (DVT) of left peroneal vein (HCC) No surgery or intervention  at this point in time.  I have reviewed my discussion with the patient regarding venous insufficiency and why it causes symptoms. I have discussed with the patient the chronic skin changes that accompany venous insufficiency and the long term sequela such as ulceration. Patient will contnue wearing graduated compression stockings on a daily basis, as this has provided excellent control of his edema. The patient will put the stockings on first thing in the morning and removing them in the evening. The patient is reminded not to sleep in the stockings.  In addition, behavioral modification including elevation during the day will be initiated. Exercise is strongly encouraged.  Given the patient's good control and lack of any problems regarding the venous insufficiency and lymphedema a lymph pump in not need at this time.  The patient will follow up with me PRN should anything change.  The patient voices agreement with this plan.   2. Other acute pulmonary embolism with acute cor pulmonale ([place]) See #1  3. Essential hypertension Continue antihypertensive medications as already ordered, these medications have been reviewed and there are no changes at this time.   4. PAF (paroxysmal atrial fibrillation) (HCC) Continue antiarrhythmia medications as already ordered, these medications have been reviewed and there are no changes at this time.  Continue anticoagulation as ordered by Cardiology Service     Hortencia Pilar, MD  08/26/2016 8:57 PM

## 2016-09-12 ENCOUNTER — Ambulatory Visit
Admission: RE | Admit: 2016-09-12 | Discharge: 2016-09-12 | Disposition: A | Discharge status: Home / Self Care | Payer: Managed Care, Other (non HMO) | Source: Ambulatory Visit | Attending: Internal Medicine | Admitting: Internal Medicine

## 2016-09-12 ENCOUNTER — Encounter: Payer: Self-pay | Admitting: Internal Medicine

## 2016-09-12 ENCOUNTER — Ambulatory Visit (INDEPENDENT_AMBULATORY_CARE_PROVIDER_SITE_OTHER): Payer: Managed Care, Other (non HMO) | Admitting: Internal Medicine

## 2016-09-12 VITALS — BP 130/80 | HR 63 | Temp 97.9°F | Wt 306.0 lb

## 2016-09-12 DIAGNOSIS — M545 Low back pain, unspecified: Secondary | ICD-10-CM

## 2016-09-12 DIAGNOSIS — M5136 Other intervertebral disc degeneration, lumbar region: Secondary | ICD-10-CM | POA: Insufficient documentation

## 2016-09-12 DIAGNOSIS — T24231A Burn of second degree of right lower leg, initial encounter: Secondary | ICD-10-CM

## 2016-09-12 DIAGNOSIS — M8938 Hypertrophy of bone, other site: Secondary | ICD-10-CM | POA: Diagnosis not present

## 2016-09-12 DIAGNOSIS — X16XXXA Contact with hot heating appliances, radiators and pipes, initial encounter: Secondary | ICD-10-CM | POA: Diagnosis not present

## 2016-09-12 MED ORDER — SILVER SULFADIAZINE 1 % EX CREA: 1.0000 "application " | TOPICAL_CREAM | Freq: Every day | CUTANEOUS | 0 refills | Status: DC
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
## 2016-09-12 NOTE — Patient Instructions (Signed)

## 2016-09-13 ENCOUNTER — Telehealth: Payer: Self-pay | Admitting: Internal Medicine

## 2016-09-13 NOTE — Telephone Encounter (Signed)
I have not reviewed it yet. Look for result note.

## 2016-09-13 NOTE — Telephone Encounter (Signed)
Patient called to get the results of his x-ray.

## 2016-09-13 NOTE — Telephone Encounter (Signed)
Patient called to get his x-ray results.

## 2016-09-16 ENCOUNTER — Telehealth: Payer: Self-pay

## 2016-09-16 NOTE — Telephone Encounter (Signed)
Does he not want to follow up with the orthopedist he already sees? Or is he just a knee guy?

## 2016-09-16 NOTE — Telephone Encounter (Signed)
Pt states he did not know if he could see him or not, pt will call current ortho doctor and let us know if he needs a referral

## 2016-09-16 NOTE — Telephone Encounter (Signed)
Pt left v/m; pt seen on 09/12/16 and back pain has worsened; pt request referral to see specialist. Pt request cb.

## 2016-10-03 ENCOUNTER — Ambulatory Visit (INDEPENDENT_AMBULATORY_CARE_PROVIDER_SITE_OTHER): Payer: Managed Care, Other (non HMO) | Admitting: Cardiovascular Disease

## 2016-10-03 ENCOUNTER — Encounter: Payer: Self-pay | Admitting: Cardiovascular Disease

## 2016-10-03 VITALS — BP 110/60 | HR 53 | Ht 69.0 in | Wt 307.0 lb

## 2016-10-03 DIAGNOSIS — I480 Paroxysmal atrial fibrillation: Secondary | ICD-10-CM | POA: Diagnosis not present

## 2016-10-03 NOTE — Progress Notes (Signed)
Cardiology Office Note   Date:  10/03/2016   ID:  Richard [name], DOB 11/18/1960, MRN [id number]  PCP:  Jearld Fenton, NP  Cardiologist:   Kathlyn Sacramento, MD   Chief Complaint  Patient presents with  . other    6 month follow up. Meds reveiwed by the pt. verbally. Pt. c/o tingling in fingers, hands and arms.       History of Present Illness: Richard [name] is a 56 y.o. male who presents for a follow-up visit regarding paroxysmal atrial fibrillation. The patient has history of normal cardiac catheterization in 2015 after an abnormal stress test, hypertension, obesity, osteoarthritis status post bilateral knee surgeries.   The patient had multiple knee surgeries starting in 2016. He developed left lower extremity DVT complicated by massive pulmonary embolism which was treated with catheter-based intervention and IVC filter placement.  He had atrial fibrillation at that time but converted to sinus rhythm. He had intermittent palpitations thought to be due to atrial fibrillation and has been maintaining in sinus rhythm with flecainide.  Lifelong anticoagulation has been recommended.  He had his IVC filter removed in July. He has been doing well with no chest pain, shortness of breath or palpitations. His knees are getting better and he wants to be more active in trying to lose some weight.  Past Medical History:  Diagnosis Date  . Arthritis   . Arthrofibrosis of total knee arthroplasty ([place]) 05/30/2015  . Atrial fibrillation ([place])   . Bilateral pulmonary embolism ([place])    a. 03/2015 CTA: acute bilat PE.  Marland Kitchen Chest pain    a. 2015 Abnl MV;  b. 2015 Cath: nl cors.  . Chronic diastolic CHF (congestive heart failure) ([place])    a. 03/2015 Echo: EF 55-65%, poor windows, mildly dil RV.  Marland Kitchen Coronary artery disease   . DVT (deep venous thrombosis) ([place])    a. 03/2015 U/S: occlusive DVT w/in the distal aspect of the Left fem vein through the L popliteal vein.  Marland Kitchen Dysrhythmia    PAF  .  GERD (gastroesophageal reflux disease)   . Headache   . Hypertension   . Hypertensive heart disease   . Obesity   . OSA (obstructive sleep apnea)    uses CPAP nightly  . Primary localized osteoarthritis of left knee    a. 11/2014 s/p L TKA.  . Primary localized osteoarthritis of right knee    a. 02/2014 s/p R Partial Knee arthroplasty.  . Stiffness of left knee    a. 12/2014 s/p manipulation under anesthesia.  . Urine incontinence     Past Surgical History:  Procedure Laterality Date  . APPENDECTOMY    . CARDIAC CATHETERIZATION    . EXAM UNDER ANESTHESIA WITH MANIPULATION OF KNEE Left 05/30/2015   Procedure: EXAM UNDER ANESTHESIA WITH MANIPULATION OF LEFT KNEE;  Surgeon: Marchia Bond, MD;  Location: [place];  Service: Orthopedics;  Laterality: Left;  . I&D KNEE WITH POLY EXCHANGE Left 10/30/2015   Procedure: IRRIGATION AND DEBRIDEMENT KNEE WITH POLY EXCHANGE PLACE SPACERS;  Surgeon: Frederik Pear, MD;  Location: [place];  Service: Orthopedics;  Laterality: Left;  OFF ELIQUIST 3 DAYS  . IVC FILTER PLACEMENT ([facility] HX)  04/13/15  . IVC FILTER REMOVAL N/A 08/13/2016   Procedure: IVC Filter Removal;  Surgeon: Katha Cabal, MD;  Location: [place] CV [facility];  Service: Cardiovascular;  Laterality: N/A;  . JOINT REPLACEMENT    . KNEE ARTHROSCOPY Left 08/08/2015   Procedure: LEFT KNEE  MANIPULATION WITH ARTHROSCOPIC LYSIS OF ADHESIONS AND ASPIRATION;  Surgeon: Marchia Bond, MD;  Location: [place];  Service: Orthopedics;  Laterality: Left;  . KNEE CLOSED REDUCTION Left 01/20/2015   Procedure: LEFT KNEE MANIPULATION;  Surgeon: Marchia Bond, MD;  Location: [place];  Service: Orthopedics;  Laterality: Left;  . LEFT HEART CATHETERIZATION WITH CORONARY ANGIOGRAM N/A 01/12/2014   Procedure: LEFT HEART CATHETERIZATION WITH CORONARY ANGIOGRAM;  Surgeon: Jettie Booze, MD;  Location: [facility] CATH LAB;  Service: Cardiovascular;  Laterality: N/A;  . ORTHOPEDIC SURGERY Left    arthroscopy  x3  . PARTIAL KNEE ARTHROPLASTY Right 02/25/2014   Procedure: RIGHT KNEE ARTHROPLASTY CONDYLE AND PLATEAU MEDIAL COMPARTMENT ;  Surgeon: Johnny Bridge, MD;  Location: [place];  Service: Orthopedics;  Laterality: Right;  . PERIPHERAL VASCULAR CATHETERIZATION N/A 03/29/2015   Procedure: IVC Filter Insertion, and possible thrombectomy;  Surgeon: Katha Cabal, MD;  Location: [place] CV [facility];  Service: Cardiovascular;  Laterality: N/A;  . TOTAL KNEE ARTHROPLASTY  12/06/2014   Procedure: TOTAL KNEE ARTHROPLASTY;  Surgeon: Marchia Bond, MD;  Location: [place];  Service: Orthopedics;;  . TOTAL KNEE REVISION Left 02/08/2016   Procedure: TOTAL KNEE REVISION;  Surgeon: Frederik Pear, MD;  Location: [place];  Service: Orthopedics;  Laterality: Left;     Current Outpatient Prescriptions  Medication Sig Dispense Refill  . acetaminophen (TYLENOL) 650 MG CR tablet Take 1,300 mg by mouth every 8 (eight) hours as needed for pain.    Marland Kitchen apixaban (ELIQUIS) 5 MG TABS tablet Take 5 mg by mouth 2 (two) times daily.    . baclofen (LIORESAL) 10 MG tablet TAKE 1-2 TABLETS (10-20 MG TOTAL) BY MOUTH 3 (THREE) TIMES DAILY AS NEEDED FOR MUSCLE SPASMS. 180 tablet 0  . bismuth subsalicylate (PEPTO-BISMOL) 262 MG chewable tablet Chew 2 tablets (524 mg total) by mouth 4 (four) times daily -  before meals and at bedtime. (Patient taking differently: Chew 524 mg by mouth 3 (three) times daily as needed for indigestion. ) 112 tablet 0  . Docusate Calcium (STOOL SOFTENER PO) Take 1-2 tablets by mouth daily as needed (mild constipation).    Marland Kitchen esomeprazole (NEXIUM) 40 MG capsule Take 40 mg by mouth daily as needed (for acid reflux).     . flecainide (TAMBOCOR) 50 MG tablet Take 1 tablet (50 mg total) by mouth 2 (two) times daily. 180 tablet 3  . furosemide (LASIX) 40 MG tablet TAKE 1 TABLET (40 MG TOTAL) BY MOUTH DAILY. 30 tablet 5  . metoprolol succinate (TOPROL-XL) 25 MG 24 hr tablet Take 1 tablet (25 mg total)  by mouth 2 (two) times daily. Take with or immediately following a meal. 90 tablet 3  . silver sulfADIAZINE (SILVADENE) 1 % cream Apply 1 application topically daily. 50 g 0  . traMADol (ULTRAM) 50 MG tablet Take 2 tablets (100 mg total) by mouth every 12 (twelve) hours as needed. 120 tablet 0   No current facility-administered medications for this visit.     Allergies:   Penicillins and Vancomycin    Social History:  The patient  reports that he has never smoked. He has never used smokeless tobacco. He reports that he drinks alcohol. He reports that he does not use drugs.   Family History:  The patient's family history includes Alzheimer's disease in his paternal grandfather; Arthritis in his mother; Coronary artery disease in his father and mother; Heart disease in his paternal grandmother; Hyperlipidemia in his mother; Hypertension  in his mother.    ROS:  Please see the history of present illness.   Otherwise, review of systems are positive for none.   All other systems are reviewed and negative.    PHYSICAL EXAM: VS:  BP 110/60 (BP Location: Left Arm, Patient Position: Sitting, Cuff Size: Large)   Pulse (!) 53   Ht 5\' 9"  (1.753 m)   Wt (!) 307 lb (139.3 kg)   BMI 45.34 kg/m  , BMI Body mass index is 45.34 kg/m. GEN: Well nourished, well developed, in no acute distress  HEENT: normal  Neck: no JVD, carotid bruits, or masses Cardiac: RRR; no murmurs, rubs, or gallops,no edema  Respiratory:  clear to auscultation bilaterally, normal work of breathing GI: soft, nontender, nondistended, + BS MS: no deformity or atrophy  Skin: warm and dry, no rash Neuro:  Strength and sensation are intact Psych: euthymic mood, full affect   EKG:  EKG is ordered today. The ekg ordered today demonstrates Sinus bradycardia with no significant ST or T wave changes.  Recent Labs: 11/11/2015: Magnesium 1.8 07/19/2016: B Natriuretic Peptide 77.5; BUN 16; Creatinine, Ser 1.63; Hemoglobin 10.1;  Platelets 251; Potassium 4.0; Sodium 135    Lipid Panel    Component Value Date/Time   CHOL 124 03/29/2015 0434   TRIG 71 03/29/2015 0434   HDL 30 (L) 03/29/2015 0434   CHOLHDL 4.1 03/29/2015 0434   VLDL 14 03/29/2015 0434   LDLCALC 80 03/29/2015 0434      Wt Readings from Last 3 Encounters:  10/03/16 (!) 307 lb (139.3 kg)  09/12/16 (!) 306 lb (138.8 kg)  08/26/16 (!) 304 lb (137.9 kg)       ASSESSMENT AND PLAN:  1.  Paroxysmal atrial fibrillation:  No evidence of recurrent arrhythmia on flecainide. He is tolerating anticoagulation with Eliquis.  2. Pulmonary embolism: Given the burden of his pulmonary embolism, recommend continuing anticoagulation.   3. Obesity: I discussed with him the importance of healthy diet, exercise and weight loss. He consumes large amount of soft drinks.  Disposition:   FU with me in 6 months  Signed,  Kathlyn Sacramento, MD  10/03/2016 11:04 AM    [place]

## 2016-10-03 NOTE — Patient Instructions (Signed)
Medication Instructions: Continue same medications.   Labwork: None.   Procedures/Testing: None.   Follow-Up: 6 months with Dr. Jahzara Slattery.   Any Additional Special Instructions Will Be Listed Below (If Applicable).     If you need a refill on your cardiac medications before your next appointment, please call your pharmacy.   

## 2016-10-21 ENCOUNTER — Other Ambulatory Visit: Payer: Self-pay | Admitting: Cardiovascular Disease

## 2016-10-30 ENCOUNTER — Other Ambulatory Visit: Payer: Self-pay | Admitting: Physician Assistant

## 2016-10-30 DIAGNOSIS — I480 Paroxysmal atrial fibrillation: Secondary | ICD-10-CM

## 2016-10-30 DIAGNOSIS — I82492 Acute embolism and thrombosis of other specified deep vein of left lower extremity: Secondary | ICD-10-CM

## 2016-10-30 DIAGNOSIS — R0789 Other chest pain: Secondary | ICD-10-CM

## 2016-10-30 DIAGNOSIS — I2699 Other pulmonary embolism without acute cor pulmonale: Secondary | ICD-10-CM

## 2016-10-30 DIAGNOSIS — Z0389 Encounter for observation for other suspected diseases and conditions ruled out: Secondary | ICD-10-CM

## 2016-10-30 DIAGNOSIS — IMO0001 Reserved for inherently not codable concepts without codable children: Secondary | ICD-10-CM

## 2016-11-05 ENCOUNTER — Encounter: Payer: Self-pay | Admitting: Internal Medicine

## 2016-11-05 ENCOUNTER — Ambulatory Visit (INDEPENDENT_AMBULATORY_CARE_PROVIDER_SITE_OTHER): Payer: PPO | Admitting: Internal Medicine

## 2016-11-05 VITALS — BP 118/68 | HR 50 | Temp 97.6°F | Wt 312.0 lb

## 2016-11-05 DIAGNOSIS — E559 Vitamin D deficiency, unspecified: Secondary | ICD-10-CM

## 2016-11-05 DIAGNOSIS — R202 Paresthesia of skin: Secondary | ICD-10-CM | POA: Diagnosis not present

## 2016-11-05 LAB — VITAMIN D 25 HYDROXY (VIT D DEFICIENCY, FRACTURES): VITD: 25.04 ng/mL — ABNORMAL LOW (ref 30.00–100.00)

## 2016-11-05 LAB — TSH: TSH: 1.65 u[IU]/mL (ref 0.35–4.50)

## 2016-11-05 LAB — HEMOGLOBIN A1C: Hgb A1c MFr Bld: 6 % (ref 4.6–6.5)

## 2016-11-05 LAB — VITAMIN B12: Vitamin B-12: 184 pg/mL — ABNORMAL LOW (ref 211–911)

## 2016-11-05 LAB — FOLATE: Folate: 9.8 ng/mL (ref >5.9)

## 2016-11-05 NOTE — Addendum Note (Signed)
Addended by: Jearld Fenton on: 11/05/2016 10:47 AM   Modules accepted: Level of Service

## 2016-11-05 NOTE — Progress Notes (Signed)
Subjective:    Patient ID: Richard [name], male    DOB: 02/08/1960, 56 y.o.   MRN: [id number]  HPI  Pt presents to the clinic today with c/o bilateral hand burning pain, tingling and weakness. He reports this has been going on for a few months. The pain seems worse with holding or grasping things. He reports the pain will wake him up in the middle of the night. The left seems worse than the right. He denies any numbness or tingling in his upper arms. He denies neck pain. He denies any injury to the wrist or hands. He has not taken anything OTC for his symptoms.  Review of Systems      Past Medical History:  Diagnosis Date  . Arthritis   . Arthrofibrosis of total knee arthroplasty ([place]) 05/30/2015  . Atrial fibrillation ([place])   . Bilateral pulmonary embolism ([place])    a. 03/2015 CTA: acute bilat PE.  Marland Kitchen Chest pain    a. 2015 Abnl MV;  b. 2015 Cath: nl cors.  . Chronic diastolic CHF (congestive heart failure) ([place])    a. 03/2015 Echo: EF 55-65%, poor windows, mildly dil RV.  Marland Kitchen Coronary artery disease   . DVT (deep venous thrombosis) ([place])    a. 03/2015 U/S: occlusive DVT w/in the distal aspect of the Left fem vein through the L popliteal vein.  Marland Kitchen Dysrhythmia    PAF  . GERD (gastroesophageal reflux disease)   . Headache   . Hypertension   . Hypertensive heart disease   . Obesity   . OSA (obstructive sleep apnea)    uses CPAP nightly  . Primary localized osteoarthritis of left knee    a. 11/2014 s/p L TKA.  . Primary localized osteoarthritis of right knee    a. 02/2014 s/p R Partial Knee arthroplasty.  . Stiffness of left knee    a. 12/2014 s/p manipulation under anesthesia.  . Urine incontinence     Current Outpatient Prescriptions  Medication Sig Dispense Refill  . acetaminophen (TYLENOL) 650 MG CR tablet Take 1,300 mg by mouth every 8 (eight) hours as needed for pain.    Marland Kitchen apixaban (ELIQUIS) 5 MG TABS tablet Take 5 mg by mouth 2 (two) times daily.    . baclofen (LIORESAL)  10 MG tablet TAKE 1-2 TABLETS (10-20 MG TOTAL) BY MOUTH 3 (THREE) TIMES DAILY AS NEEDED FOR MUSCLE SPASMS. 180 tablet 0  . bismuth subsalicylate (PEPTO-BISMOL) 262 MG chewable tablet Chew 2 tablets (524 mg total) by mouth 4 (four) times daily -  before meals and at bedtime. (Patient taking differently: Chew 524 mg by mouth 3 (three) times daily as needed for indigestion. ) 112 tablet 0  . Docusate Calcium (STOOL SOFTENER PO) Take 1-2 tablets by mouth daily as needed (mild constipation).    Marland Kitchen esomeprazole (NEXIUM) 40 MG capsule Take 40 mg by mouth daily as needed (for acid reflux).     . flecainide (TAMBOCOR) 50 MG tablet TAKE 1 TABLET (50 MG TOTAL) BY MOUTH 2 (TWO) TIMES DAILY. 180 tablet 3  . furosemide (LASIX) 40 MG tablet TAKE 1 TABLET (40 MG TOTAL) BY MOUTH DAILY. 30 tablet 5  . metoprolol succinate (TOPROL-XL) 25 MG 24 hr tablet TAKE 1 TABLET (25 MG TOTAL) BY MOUTH 2 (TWO) TIMES DAILY. TAKE WITH OR IMMEDIATELY FOLLOWING A MEAL. 90 tablet 3  . silver sulfADIAZINE (SILVADENE) 1 % cream Apply 1 application topically daily. 50 g 0  . traMADol (ULTRAM) 50 MG tablet Take  2 tablets (100 mg total) by mouth every 12 (twelve) hours as needed. 120 tablet 0   No current facility-administered medications for this visit.     Allergies  Allergen Reactions  . Penicillins Anaphylaxis and Other (See Comments)    Childhood allergy. Pt unsure of reaction Has patient had a PCN reaction causing immediate rash, facial/tongue/throat swelling, SOB or lightheadedness with hypotension: Unknown Has patient had a PCN reaction causing severe rash involving mucus membranes or skin necrosis: Unknown Has patient had a PCN reaction that required hospitalization: Unknown Has patient had a PCN reaction occurring within the last 10 years: No If all of the above answers are "NO", then may proceed with Cephalosporin use.   . Vancomycin Other (See Comments)    Red Mans' syndrome    Family History  Problem Relation Age of  Onset  . Coronary artery disease Mother   . Hyperlipidemia Mother   . Hypertension Mother   . Arthritis Mother   . Coronary artery disease Father   . Heart disease Paternal Grandmother   . Alzheimer's disease Paternal Grandfather     Social History   Social History  . Marital status: Married    Spouse name: N/A  . Number of children: N/A  . 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   Social History Main Topics  . Smoking status: Never Smoker  . Smokeless tobacco: Never Used  . Alcohol use Yes     Comment: occassional  . Drug use: No  . Sexual activity: Yes   Other Topics Concern  . Not on file   Social History Narrative  . No narrative on file     Constitutional: Denies fever, malaise, fatigue, headache or abrupt weight changes.  Musculoskeletal: Pt reports bilateral hand pain. Denies decrease in range of motion, difficulty with gait, muscle pain or joint swelling.  Neurological: Pt reports tingling of bilateral hands. Denies dizziness, difficulty with memory, difficulty with speech or problems with balance and coordination.    No other specific complaints in a complete review of systems (except as listed in HPI above).  Objective:   Physical Exam   BP 118/68   Pulse (!) 50   Temp 97.6 F (36.4 C) (Oral)   Wt (!) 312 lb (141.5 kg)   SpO2 97%   BMI 46.07 kg/m  Wt Readings from Last 3 Encounters:  11/05/16 (!) 312 lb (141.5 kg)  10/03/16 (!) 307 lb (139.3 kg)  09/12/16 (!) 306 lb (138.8 kg)    General: Appears his stated age, obese in NAD. Cardiovascular: Radial pulses 2+ bilaterally. Cap refill < 3 secs bilaterally. Musculoskeletal: Normal flexion, extension and rotation of the wrist. Normal flexion and extension of the fingers. No joint swelling noted of the hands. Hand grips equal. Neurological: Sensation intact to BUE. Negative Phalen's. Negative Tinel's.  BMET    Component Value Date/Time   NA 135 07/19/2016 1333   K  4.0 07/19/2016 1333   CL 107 07/19/2016 1333   CO2 23 07/19/2016 1333   GLUCOSE 106 (H) 07/19/2016 1333   BUN 16 07/19/2016 1333   CREATININE 1.63 (H) 07/19/2016 1333   CREATININE 1.27 12/06/2015 1600   CALCIUM 8.8 (L) 07/19/2016 1333   GFRNONAA 46 (L) 07/19/2016 1333   GFRAA 53 (L) 07/19/2016 1333    Lipid Panel     Component Value Date/Time   CHOL 124 03/29/2015 0434   TRIG 71 03/29/2015 0434   HDL 30 (L) 03/29/2015  0434   CHOLHDL 4.1 03/29/2015 0434   VLDL 14 03/29/2015 0434   LDLCALC 80 03/29/2015 0434    CBC    Component Value Date/Time   WBC 7.3 07/19/2016 1333   RBC 4.89 07/19/2016 1333   HGB 10.1 (L) 07/19/2016 1333   HCT 35.0 (L) 07/19/2016 1333   PLT 251 07/19/2016 1333   MCV 71.6 (L) 07/19/2016 1333   MCH 20.7 (L) 07/19/2016 1333   MCHC 28.9 (L) 07/19/2016 1333   RDW 18.1 (H) 07/19/2016 1333   LYMPHSABS 1.8 07/19/2016 1333   MONOABS 0.6 07/19/2016 1333   EOSABS 0.1 07/19/2016 1333   BASOSABS 0.0 07/19/2016 1333    Hgb A1C Lab Results  Component Value Date   HGBA1C 5.5 06/22/2015           Assessment & Plan:   Paresthesia of BUE:  Will check A1C, TSH, B12, Folate and Vit D Consider trying Neurontin 100 mg TID prn for pain, but will await lab results Consider referral to neurology for EMG testing  Will follow up after labs, return precautions discussed Webb Silversmith, NP

## 2016-11-05 NOTE — Patient Instructions (Signed)
Paresthesia Paresthesia is a burning or prickling feeling. This feeling can happen in any part of the body. It often happens in the hands, arms, legs, or feet. Usually, it is not painful. In most cases, the feeling goes away in a short time and is not a sign of a serious problem. Follow these instructions at home:  Avoid drinking alcohol.  Try massage or needle therapy (acupuncture) to help with your problems.  Keep all follow-up visits as told by your doctor. This is important. Contact a doctor if:  You keep on having episodes of paresthesia.  Your burning or prickling feeling gets worse when you walk.  You have pain or cramps.  You feel dizzy.  You have a rash. Get help right away if:  You feel weak.  You have trouble walking or moving.  You have problems speaking, understanding, or seeing.  You feel confused.  You cannot control when you pee (urinate) or poop (bowel movement).  You lose feeling (numbness) after an injury.  You pass out (faint). This information is not intended to replace advice given to you by your health care provider. Make sure you discuss any questions you have with your health care provider. Document Released: 12/21/2007 Document Revised: 06/15/2015 Document Reviewed: 01/03/2014 Elsevier Interactive Patient Education  2018 Elsevier Inc.  

## 2016-11-06 ENCOUNTER — Telehealth: Payer: Self-pay | Admitting: Internal Medicine

## 2016-11-06 NOTE — Telephone Encounter (Signed)
Best number [phone]  [phone]  Pt called checking on lab results

## 2016-11-06 NOTE — Telephone Encounter (Signed)
They are back, see result note

## 2016-11-07 MED ORDER — VITAMIN D (ERGOCALCIFEROL) 1.25 MG (50000 UNIT) PO CAPS: 50000.0000 [IU] | ORAL_CAPSULE | ORAL | 0 refills | Status: DC

## 2016-11-07 NOTE — Addendum Note (Signed)
Addended by: Lurlean Nanny on: 11/07/2016 04:41 PM   Modules accepted: Orders

## 2016-11-13 ENCOUNTER — Ambulatory Visit (INDEPENDENT_AMBULATORY_CARE_PROVIDER_SITE_OTHER): Payer: PPO | Admitting: *Deleted

## 2016-11-13 ENCOUNTER — Other Ambulatory Visit: Payer: Self-pay | Admitting: Cardiovascular Disease

## 2016-11-13 DIAGNOSIS — E538 Deficiency of other specified B group vitamins: Secondary | ICD-10-CM | POA: Diagnosis not present

## 2016-11-13 MED ORDER — CYANOCOBALAMIN 1000 MCG/ML IJ SOLN
1000.0000 ug | Freq: Once | INTRAMUSCULAR | Status: AC
  Administered 2016-11-13: 1000 ug via INTRAMUSCULAR

## 2016-12-17 ENCOUNTER — Ambulatory Visit: Payer: PPO

## 2016-12-18 ENCOUNTER — Ambulatory Visit (INDEPENDENT_AMBULATORY_CARE_PROVIDER_SITE_OTHER): Payer: Managed Care, Other (non HMO)

## 2016-12-18 ENCOUNTER — Other Ambulatory Visit: Payer: Self-pay

## 2016-12-18 ENCOUNTER — Other Ambulatory Visit (HOSPITAL_COMMUNITY): Payer: Self-pay | Admitting: Orthopedic Surgery

## 2016-12-18 DIAGNOSIS — E538 Deficiency of other specified B group vitamins: Secondary | ICD-10-CM | POA: Diagnosis not present

## 2016-12-18 DIAGNOSIS — R590 Localized enlarged lymph nodes: Secondary | ICD-10-CM

## 2016-12-18 MED ORDER — CYANOCOBALAMIN 1000 MCG/ML IJ SOLN
1000.0000 ug | Freq: Once | INTRAMUSCULAR | Status: AC
Start: 2016-12-18 — End: 2016-12-18
  Administered 2016-12-18: 1000 ug via INTRAMUSCULAR

## 2016-12-18 NOTE — Patient Outreach (Signed)
[name]  12/18/2016  Richard [name] 12/26/1960 [id number]   [employer] High risk screen completed. No Care management needs identified.  Thea Silversmith, RN, MSN, [place] Coordinator Cell: [phone]

## 2016-12-20 ENCOUNTER — Ambulatory Visit (INDEPENDENT_AMBULATORY_CARE_PROVIDER_SITE_OTHER): Payer: Managed Care, Other (non HMO) | Admitting: Internal Medicine

## 2016-12-20 ENCOUNTER — Encounter: Payer: Self-pay | Admitting: Internal Medicine

## 2016-12-20 VITALS — BP 122/72 | HR 54 | Temp 97.7°F | Wt 307.8 lb

## 2016-12-20 DIAGNOSIS — E538 Deficiency of other specified B group vitamins: Secondary | ICD-10-CM

## 2016-12-20 DIAGNOSIS — R202 Paresthesia of skin: Secondary | ICD-10-CM

## 2016-12-20 DIAGNOSIS — E559 Vitamin D deficiency, unspecified: Secondary | ICD-10-CM | POA: Diagnosis not present

## 2016-12-20 MED ORDER — GABAPENTIN 100 MG PO CAPS: 100.0000 mg | ORAL_CAPSULE | Freq: Every day | ORAL | 2 refills | Status: DC | PRN

## 2016-12-20 NOTE — Progress Notes (Signed)
Subjective:    Patient ID: Richard [name], male    DOB: Mar 11, 1960, 56 y.o.   MRN: [id number]  HPI  Pt presents to the clinic today with c/o pain, tingling and weakness of his bilateral hands. This has been going on 3-4 months. He was seen 10/16 for the same. He was noted to have low Vit D and low B12. He was started on Ergocalciferol and B12 injections, but has only been on treatment for about 6 weeks. He reports improvement in his energy level, but no improvement in his hand pain, tingling or weakness.  Review of Systems      Past Medical History:  Diagnosis Date  . Arthritis   . Arthrofibrosis of total knee arthroplasty ([place]) 05/30/2015  . Atrial fibrillation ([place])   . Bilateral pulmonary embolism ([place])    a. 03/2015 CTA: acute bilat PE.  Marland Kitchen Chest pain    a. 2015 Abnl MV;  b. 2015 Cath: nl cors.  . Chronic diastolic CHF (congestive heart failure) ([place])    a. 03/2015 Echo: EF 55-65%, poor windows, mildly dil RV.  Marland Kitchen Coronary artery disease   . DVT (deep venous thrombosis) ([place])    a. 03/2015 U/S: occlusive DVT w/in the distal aspect of the Left fem vein through the L popliteal vein.  Marland Kitchen Dysrhythmia    PAF  . GERD (gastroesophageal reflux disease)   . Headache   . Hypertension   . Hypertensive heart disease   . Obesity   . OSA (obstructive sleep apnea)    uses CPAP nightly  . Primary localized osteoarthritis of left knee    a. 11/2014 s/p L TKA.  . Primary localized osteoarthritis of right knee    a. 02/2014 s/p R Partial Knee arthroplasty.  . Stiffness of left knee    a. 12/2014 s/p manipulation under anesthesia.  . Urine incontinence     Current Outpatient Medications  Medication Sig Dispense Refill  . acetaminophen (TYLENOL) 650 MG CR tablet Take 1,300 mg by mouth every 8 (eight) hours as needed for pain.    . baclofen (LIORESAL) 10 MG tablet TAKE 1-2 TABLETS (10-20 MG TOTAL) BY MOUTH 3 (THREE) TIMES DAILY AS NEEDED FOR MUSCLE SPASMS. 180 tablet 0  . bismuth  subsalicylate (PEPTO-BISMOL) 262 MG chewable tablet Chew 2 tablets (524 mg total) by mouth 4 (four) times daily -  before meals and at bedtime. (Patient taking differently: Chew 524 mg by mouth 3 (three) times daily as needed for indigestion. ) 112 tablet 0  . cyanocobalamin (,VITAMIN B-12,) 1000 MCG/ML injection Inject 1,000 mcg into the muscle every 30 (thirty) days.    Mariane Baumgarten Calcium (STOOL SOFTENER PO) Take 1-2 tablets by mouth daily as needed (mild constipation).    Marland Kitchen ELIQUIS 5 MG TABS tablet TAKE 1 TABLET (5 MG TOTAL) BY MOUTH 2 (TWO) TIMES DAILY. 180 tablet 3  . esomeprazole (NEXIUM) 40 MG capsule Take 40 mg by mouth daily as needed (for acid reflux).     . flecainide (TAMBOCOR) 50 MG tablet TAKE 1 TABLET (50 MG TOTAL) BY MOUTH 2 (TWO) TIMES DAILY. 180 tablet 3  . furosemide (LASIX) 40 MG tablet TAKE 1 TABLET (40 MG TOTAL) BY MOUTH DAILY. 30 tablet 5  . metoprolol succinate (TOPROL-XL) 25 MG 24 hr tablet TAKE 1 TABLET (25 MG TOTAL) BY MOUTH 2 (TWO) TIMES DAILY. TAKE WITH OR IMMEDIATELY FOLLOWING A MEAL. 90 tablet 3  . silver sulfADIAZINE (SILVADENE) 1 % cream Apply 1 application topically daily.  50 g 0  . traMADol (ULTRAM) 50 MG tablet Take 2 tablets (100 mg total) by mouth every 12 (twelve) hours as needed. 120 tablet 0  . Vitamin D, Ergocalciferol, (DRISDOL) 50000 units CAPS capsule Take 1 capsule (50,000 Units total) by mouth every 7 (seven) days. 12 capsule 0   No current facility-administered medications for this visit.     Allergies  Allergen Reactions  . Penicillins Anaphylaxis and Other (See Comments)    Childhood allergy. Pt unsure of reaction Has patient had a PCN reaction causing immediate rash, facial/tongue/throat swelling, SOB or lightheadedness with hypotension: Unknown Has patient had a PCN reaction causing severe rash involving mucus membranes or skin necrosis: Unknown Has patient had a PCN reaction that required hospitalization: Unknown Has patient had a PCN  reaction occurring within the last 10 years: No If all of the above answers are "NO", then may proceed with Cephalosporin use.   . Vancomycin Other (See Comments)    Red Mans' syndrome    Family History  Problem Relation Age of Onset  . Coronary artery disease Mother   . Hyperlipidemia Mother   . Hypertension Mother   . Arthritis Mother   . Coronary artery disease Father   . Heart disease Paternal Grandmother   . Alzheimer's disease Paternal Grandfather     Social History   Socioeconomic History  . Marital status: Married    Spouse name: Not on file  . Number of children: Not on file  . Years of education: Not on file  . Highest education level: Not on file  Social Needs  . Financial resource strain: Not on file  . Food insecurity - worry: Not on file  . Food insecurity - inability: Not on file  . Transportation needs - medical: Not on file  . Transportation needs - non-medical: Not on file  Occupational History  . Occupation: Energy manager: [employer]  Tobacco Use  . Smoking status: Never Smoker  . Smokeless tobacco: Never Used  Substance and Sexual Activity  . Alcohol use: Yes    Comment: occassional  . Drug use: No  . Sexual activity: Yes  Other Topics Concern  . Not on file  Social History Narrative  . Not on file     Constitutional: Denies fever, malaise, fatigue, headache or abrupt weight changes.  Musculoskeletal: Pt reports bilateral hand pain and weakness. Denies decrease in range of motion, difficulty with gait, muscle pain or joint swelling.  Skin: Denies redness, rashes, lesions or ulcercations.  Neurological: Pt reports tingling in bilateral hands. Denies dizziness, difficulty with memory, difficulty with speech or problems with balance and coordination.   No other specific complaints in a complete review of systems (except as listed in HPI above).  Objective:   Physical Exam   BP 122/72   Pulse (!) 54   Temp 97.7 F (36.5 C) (Oral)    Wt (!) 307 lb 12 oz (139.6 kg)   SpO2 98%   BMI 45.45 kg/m   Wt Readings from Last 3 Encounters:  11/05/16 (!) 312 lb (141.5 kg)  10/03/16 (!) 307 lb (139.3 kg)  09/12/16 (!) 306 lb (138.8 kg)    General: Appears his stated age, obese in NAD. Cardiovascular: Radial pulses 2+ bilaterally. Cap refill < 3 secs. Musculoskeletal: Normal flexion, extension and rotation of bilateral wrist's. Normal flexion and extension noted of bilateral fingers. Hand grips equal.  Neurological: Sensation intact to BUE. Negative Phalen's. Negative Tinel's.   BMET  Component Value Date/Time   NA 135 07/19/2016 1333   K 4.0 07/19/2016 1333   CL 107 07/19/2016 1333   CO2 23 07/19/2016 1333   GLUCOSE 106 (H) 07/19/2016 1333   BUN 16 07/19/2016 1333   CREATININE 1.63 (H) 07/19/2016 1333   CREATININE 1.27 12/06/2015 1600   CALCIUM 8.8 (L) 07/19/2016 1333   GFRNONAA 46 (L) 07/19/2016 1333   GFRAA 53 (L) 07/19/2016 1333    Lipid Panel     Component Value Date/Time   CHOL 124 03/29/2015 0434   TRIG 71 03/29/2015 0434   HDL 30 (L) 03/29/2015 0434   CHOLHDL 4.1 03/29/2015 0434   VLDL 14 03/29/2015 0434   LDLCALC 80 03/29/2015 0434    CBC    Component Value Date/Time   WBC 7.3 07/19/2016 1333   RBC 4.89 07/19/2016 1333   HGB 10.1 (L) 07/19/2016 1333   HCT 35.0 (L) 07/19/2016 1333   PLT 251 07/19/2016 1333   MCV 71.6 (L) 07/19/2016 1333   MCH 20.7 (L) 07/19/2016 1333   MCHC 28.9 (L) 07/19/2016 1333   RDW 18.1 (H) 07/19/2016 1333   LYMPHSABS 1.8 07/19/2016 1333   MONOABS 0.6 07/19/2016 1333   EOSABS 0.1 07/19/2016 1333   BASOSABS 0.0 07/19/2016 1333    Hgb A1C Lab Results  Component Value Date   HGBA1C 6.0 11/05/2016           Assessment & Plan:   Paresthesia of Bilateral Hands, B12 Deficiency, Vit D Deficiency:  No improvement with B12 or Vit D supplementation, but will continue for now He is not interested in referral to neurology at this time Will trial Neurontin 100 mg  PO daily- sedation caution given Tramadol refilled today  Return precautions discussed Webb Silversmith, NP

## 2016-12-20 NOTE — Patient Instructions (Signed)
Peripheral Neuropathy Peripheral neuropathy is a type of nerve damage. It affects nerves that carry signals between the spinal cord and other parts of the body. These are called peripheral nerves. With peripheral neuropathy, one nerve or a group of nerves may be damaged. What are the causes? Many things can damage peripheral nerves. For some people with peripheral neuropathy, the cause is unknown. Some causes include:  Diabetes. This is the most common cause of peripheral neuropathy.  Injury to a nerve.  Pressure or stress on a nerve that lasts a long time.  Too little vitamin B. Alcoholism can lead to this.  Infections.  Autoimmune diseases, such as multiple sclerosis and systemic lupus erythematosus.  Inherited nerve diseases.  Some medicines, such as cancer drugs.  Toxic substances, such as lead and mercury.  Too little blood flowing to the legs.  Kidney disease.  Thyroid disease.  What are the signs or symptoms? Different people have different symptoms. The symptoms you have will depend on which of your nerves is damaged. Common symptoms include:  Loss of feeling (numbness) in the feet and hands.  Tingling in the feet and hands.  Pain that burns.  Very sensitive skin.  Weakness.  Not being able to move a part of the body (paralysis).  Muscle twitching.  Clumsiness or poor coordination.  Loss of balance.  Not being able to control your bladder.  Feeling dizzy.  Sexual problems.  How is this diagnosed? Peripheral neuropathy is a symptom, not a disease. Finding the cause of peripheral neuropathy can be hard. To figure that out, your health care provider will take a medical history and do a physical exam. A neurological exam will also be done. This involves checking things affected by your brain, spinal cord, and nerves (nervous system). For example, your health care provider will check your reflexes, how you move, and what you can feel. Other types of tests  may also be ordered, such as:  Blood tests.  A test of the fluid in your spinal cord.  Imaging tests, such as CT scans or an MRI.  Electromyography (EMG). This test checks the nerves that control muscles.  Nerve conduction velocity tests. These tests check how fast messages pass through your nerves.  Nerve biopsy. A small piece of nerve is removed. It is then checked under a microscope.  How is this treated?  Medicine is often used to treat peripheral neuropathy. Medicines may include: ? Pain-relieving medicines. Prescription or over-the-counter medicine may be suggested. ? Antiseizure medicine. This may be used for pain. ? Antidepressants. These also may help ease pain from neuropathy. ? Lidocaine. This is a numbing medicine. You might wear a patch or be given a shot. ? Mexiletine. This medicine is typically used to help control irregular heart rhythms.  Surgery. Surgery may be needed to relieve pressure on a nerve or to destroy a nerve that is causing pain.  Physical therapy to help movement.  Assistive devices to help movement. Follow these instructions at home:  Only take over-the-counter or prescription medicines as directed by your health care provider. Follow the instructions carefully for any given medicines. Do not take any other medicines without first getting approval from your health care provider.  If you have diabetes, work closely with your health care provider to keep your blood sugar under control.  If you have numbness in your feet: ? Check every day for signs of injury or infection. Watch for redness, warmth, and swelling. ? Wear padded socks and comfortable   shoes. These help protect your feet.  Do not do things that put pressure on your damaged nerve.  Do not smoke. Smoking keeps blood from getting to damaged nerves.  Avoid or limit alcohol. Too much alcohol can cause a lack of B vitamins. These vitamins are needed for healthy nerves.  Develop a good  support system. Coping with peripheral neuropathy can be stressful. Talk to a mental health specialist or join a support group if you are struggling.  Follow up with your health care provider as directed. Contact a health care provider if:  You have new signs or symptoms of peripheral neuropathy.  You are struggling emotionally from dealing with peripheral neuropathy.  You have a fever. Get help right away if:  You have an injury or infection that is not healing.  You feel very dizzy or begin vomiting.  You have chest pain.  You have trouble breathing. This information is not intended to replace advice given to you by your health care provider. Make sure you discuss any questions you have with your health care provider. Document Released: 12/28/2001 Document Revised: 06/15/2015 Document Reviewed: 09/14/2012 Elsevier Interactive Patient Education  2017 Elsevier Inc.  

## 2016-12-23 ENCOUNTER — Other Ambulatory Visit: Payer: Self-pay | Admitting: Orthopedic Surgery

## 2016-12-23 DIAGNOSIS — R599 Enlarged lymph nodes, unspecified: Secondary | ICD-10-CM

## 2016-12-29 ENCOUNTER — Emergency Department: Payer: Managed Care, Other (non HMO)

## 2016-12-29 ENCOUNTER — Emergency Department
Admission: EM | Admit: 2016-12-29 | Discharge: 2016-12-29 | Disposition: A | Discharge status: Home / Self Care | Payer: Managed Care, Other (non HMO) | Attending: Emergency Medicine | Admitting: Emergency Medicine

## 2016-12-29 DIAGNOSIS — I4891 Unspecified atrial fibrillation: Secondary | ICD-10-CM | POA: Diagnosis not present

## 2016-12-29 DIAGNOSIS — I110 Hypertensive heart disease with heart failure: Secondary | ICD-10-CM | POA: Insufficient documentation

## 2016-12-29 DIAGNOSIS — R079 Chest pain, unspecified: Secondary | ICD-10-CM

## 2016-12-29 DIAGNOSIS — Z79899 Other long term (current) drug therapy: Secondary | ICD-10-CM | POA: Diagnosis not present

## 2016-12-29 DIAGNOSIS — R748 Abnormal levels of other serum enzymes: Secondary | ICD-10-CM

## 2016-12-29 DIAGNOSIS — I509 Heart failure, unspecified: Secondary | ICD-10-CM | POA: Diagnosis not present

## 2016-12-29 LAB — BASIC METABOLIC PANEL
Anion gap: 9 (ref 5–15)
BUN: 25 mg/dL — ABNORMAL HIGH (ref 6–20)
CO2: 23 mmol/L (ref 22–32)
Calcium: 8.7 mg/dL — ABNORMAL LOW (ref 8.9–10.3)
Chloride: 107 mmol/L (ref 101–111)
Creatinine, Ser: 1.43 mg/dL — ABNORMAL HIGH (ref 0.61–1.24)
GFR calc Af Amer: >60 mL/min (ref >60)
GFR calc non Af Amer: 54 mL/min — ABNORMAL LOW (ref >60)
Glucose, Bld: 154 mg/dL — ABNORMAL HIGH (ref 65–99)
Potassium: 3.8 mmol/L (ref 3.5–5.1)
Sodium: 139 mmol/L (ref 135–145)

## 2016-12-29 LAB — HEPATIC FUNCTION PANEL
ALT: 37 U/L (ref 17–63)
AST: 30 U/L (ref 15–41)
Albumin: 3.5 g/dL (ref 3.5–5.0)
Alkaline Phosphatase: 82 U/L (ref 38–126)
Bilirubin, Direct: <0.1 mg/dL — ABNORMAL LOW (ref 0.1–0.5)
Total Bilirubin: 0.1 mg/dL — ABNORMAL LOW (ref 0.3–1.2)
Total Protein: 6.8 g/dL (ref 6.5–8.1)

## 2016-12-29 LAB — PROTIME-INR
INR: 1.14
Prothrombin Time: 14.5 seconds (ref 11.4–15.2)

## 2016-12-29 LAB — CBC
HCT: 38.2 % — ABNORMAL LOW (ref 40.0–52.0)
Hemoglobin: 11.9 g/dL — ABNORMAL LOW (ref 13.0–18.0)
MCH: 22.1 pg — ABNORMAL LOW (ref 26.0–34.0)
MCHC: 31.1 g/dL — ABNORMAL LOW (ref 32.0–36.0)
MCV: 71.2 fL — ABNORMAL LOW (ref 80.0–100.0)
Platelets: 224 10*3/uL (ref 150–440)
RBC: 5.36 MIL/uL (ref 4.40–5.90)
RDW: 18.3 % — ABNORMAL HIGH (ref 11.5–14.5)
WBC: 10.8 10*3/uL — ABNORMAL HIGH (ref 3.8–10.6)

## 2016-12-29 LAB — APTT: aPTT: 26 seconds (ref 24–36)

## 2016-12-29 LAB — LIPASE, BLOOD: Lipase: 161 U/L — ABNORMAL HIGH (ref 11–51)

## 2016-12-29 LAB — TROPONIN I
Troponin I: <0.03 ng/mL (ref <0.03)
Troponin I: <0.03 ng/mL (ref <0.03)

## 2016-12-29 LAB — MAGNESIUM: Magnesium: 1.7 mg/dL (ref 1.7–2.4)

## 2016-12-29 MED ORDER — METOPROLOL TARTRATE 5 MG/5ML IV SOLN
5.0000 mg | INTRAVENOUS | Status: AC | PRN
  Administered 2016-12-29 (×3): 5 mg via INTRAVENOUS
  Filled 2016-12-29: qty 5

## 2016-12-29 MED ORDER — METOPROLOL SUCCINATE ER 50 MG PO TB24
50.0000 mg | ORAL_TABLET | ORAL | Status: AC
  Administered 2016-12-29: 50 mg via ORAL

## 2016-12-29 MED ORDER — METOPROLOL SUCCINATE ER 50 MG PO TB24: 50.0000 mg | ORAL_TABLET | Freq: Every day | ORAL | Status: DC

## 2016-12-29 MED ORDER — METOPROLOL SUCCINATE ER 50 MG PO TB24
ORAL_TABLET | ORAL | Status: AC
  Filled 2016-12-29: qty 1

## 2016-12-29 MED ORDER — METOPROLOL SUCCINATE ER 25 MG PO TB24: 50.0000 mg | ORAL_TABLET | Freq: Two times a day (BID) | ORAL | 3 refills | Status: DC

## 2016-12-29 NOTE — ED Notes (Signed)
ED Provider at bedside. 

## 2016-12-29 NOTE — ED Notes (Signed)
X-ray at bedside

## 2016-12-29 NOTE — ED Provider Notes (Signed)
[facility] Emergency Department Provider Note  ____________________________________________   First MD Initiated Contact with Patient 12/29/16 0104     (approximate)  I have reviewed the triage vital signs and the nursing notes.   HISTORY  Chief Complaint Chest Pain    HPI JOHNTAVIOUS FRANCOM is a 56 y.o. male whose medical history includes chronic atrial fibrillation on flecainide and metoprolol as well as a history of bilateral pulmonary emboli which were diagnosed almost 2 years ago and for which she continues to take Eliquis (even the history of PE and A. fib).  He presents by private vehicle tonight for evaluation of substernal chest pain radiating to the back that feels like a burning and sharp pain.  It was present along with a feeling of palpitations and racing heart which is consistent with when he has "an A. fib attack" (which means RVR).    He states that the substernal discomfort has actually been present for about 7-8 months and is worse when he states a deep breath.  He is concerned it might be more blood clots in spite of his Eliquis.  He had an IVC filter which he states was removed about 4 months ago.  Tonight he is not having any worse chest pain, it seems to be more or less at his baseline, maybe somewhat worse with the palpitations.  Past Medical History:  Diagnosis Date  . Arthritis   . Arthrofibrosis of total knee arthroplasty ([place]) 05/30/2015  . Atrial fibrillation ([place])   . Bilateral pulmonary embolism ([place])    a. 03/2015 CTA: acute bilat PE.  Marland Kitchen Chest pain    a. 2015 Abnl MV;  b. 2015 Cath: nl cors.  . Chronic diastolic CHF (congestive heart failure) ([place])    a. 03/2015 Echo: EF 55-65%, poor windows, mildly dil RV.  Marland Kitchen Coronary artery disease   . DVT (deep venous thrombosis) ([place])    a. 03/2015 U/S: occlusive DVT w/in the distal aspect of the Left fem vein through the L popliteal vein.  Marland Kitchen Dysrhythmia    PAF  . GERD (gastroesophageal  reflux disease)   . Headache   . Hypertension   . Hypertensive heart disease   . Obesity   . OSA (obstructive sleep apnea)    uses CPAP nightly  . Primary localized osteoarthritis of left knee    a. 11/2014 s/p L TKA.  . Primary localized osteoarthritis of right knee    a. 02/2014 s/p R Partial Knee arthroplasty.  . Stiffness of left knee    a. 12/2014 s/p manipulation under anesthesia.  . Urine incontinence     Patient Active Problem List   Diagnosis Date Noted  . Arthrofibrosis of total knee arthroplasty ([place]) 05/30/2015  . PAF (paroxysmal atrial fibrillation) ([place]) 03/30/2015  . Peroneal DVT (deep venous thrombosis) ([place])   . Pulmonary embolism ([place]) 03/27/2015  . Hypertension 08/17/2011  . LVH (left ventricular hypertrophy) 08/17/2011  . GERD (gastroesophageal reflux disease) 08/17/2011  . OSA (obstructive sleep apnea) 08/16/2011    Past Surgical History:  Procedure Laterality Date  . APPENDECTOMY    . CARDIAC CATHETERIZATION    . EXAM UNDER ANESTHESIA WITH MANIPULATION OF KNEE Left 05/30/2015   Procedure: EXAM UNDER ANESTHESIA WITH MANIPULATION OF LEFT KNEE;  Surgeon: Marchia Bond, MD;  Location: [place];  Service: Orthopedics;  Laterality: Left;  . I&D KNEE WITH POLY EXCHANGE Left 10/30/2015   Procedure: IRRIGATION AND DEBRIDEMENT KNEE WITH POLY EXCHANGE PLACE SPACERS;  Surgeon: Pilar Plate  Mayer Camel, MD;  Location: [place];  Service: Orthopedics;  Laterality: Left;  OFF ELIQUIST 3 DAYS  . IVC FILTER PLACEMENT ([facility] HX)  04/13/15  . IVC FILTER REMOVAL N/A 08/13/2016   Procedure: IVC Filter Removal;  Surgeon: Katha Cabal, MD;  Location: [place] CV [facility];  Service: Cardiovascular;  Laterality: N/A;  . JOINT REPLACEMENT    . KNEE ARTHROSCOPY Left 08/08/2015   Procedure: LEFT KNEE MANIPULATION WITH ARTHROSCOPIC LYSIS OF ADHESIONS AND ASPIRATION;  Surgeon: Marchia Bond, MD;  Location: [place];  Service: Orthopedics;  Laterality: Left;  . KNEE CLOSED REDUCTION Left 01/20/2015    Procedure: LEFT KNEE MANIPULATION;  Surgeon: Marchia Bond, MD;  Location: [place];  Service: Orthopedics;  Laterality: Left;  . LEFT HEART CATHETERIZATION WITH CORONARY ANGIOGRAM N/A 01/12/2014   Procedure: LEFT HEART CATHETERIZATION WITH CORONARY ANGIOGRAM;  Surgeon: Jettie Booze, MD;  Location[facility];  Service: Cardiovascular;  Laterality: N/A;  . ORTHOPEDIC SURGERY Left    arthroscopy x3  . PARTIAL KNEE ARTHROPLASTY Right 02/25/2014   Procedure: RIGHT KNEE ARTHROPLASTY CONDYLE AND PLATEAU MEDIAL COMPARTMENT ;  Surgeon: Johnny Bridge, MD;  Location: [place];  Service: Orthopedics;  Laterality: Right;  . PERIPHERAL VASCULAR CATHETERIZATION N/A 03/29/2015   Procedure: IVC Filter Insertion, and possible thrombectomy;  Surgeon: Katha Cabal, MD;  Location: [place] [facility];  Service: Cardiovascular;  Laterality: N/A;  . TOTAL KNEE ARTHROPLASTY  12/06/2014   Procedure: TOTAL KNEE ARTHROPLASTY;  Surgeon: Marchia Bond, MD;  Location: [place];  Service: Orthopedics;;  . TOTAL KNEE REVISION Left 02/08/2016   Procedure: TOTAL KNEE REVISION;  Surgeon: Frederik Pear, MD;  Location: [place];  Service: Orthopedics;  Laterality: Left;    Prior to Admission medications   Medication Sig Start Date End Date Taking? Authorizing Provider  acetaminophen (TYLENOL) 650 MG CR tablet Take 1,300 mg by mouth every 8 (eight) hours as needed for pain.    [provider]  baclofen (LIORESAL) 10 MG tablet TAKE 1-2 TABLETS (10-20 MG TOTAL) BY MOUTH 3 (THREE) TIMES DAILY AS NEEDED FOR MUSCLE SPASMS. 05/06/16   Jearld Fenton, NP  bismuth subsalicylate (PEPTO-BISMOL) 262 MG chewable tablet Chew 2 tablets (524 mg total) by mouth 4 (four) times daily -  before meals and at bedtime. Patient taking differently: Chew 524 mg by mouth 3 (three) times daily as needed for indigestion.  11/14/15   Jearld Fenton, NP  cyanocobalamin (,VITAMIN B-12,) 1000 MCG/ML injection Inject  1,000 mcg into the muscle every 30 (thirty) days.    [provider]  Docusate Calcium (STOOL SOFTENER PO) Take 1-2 tablets by mouth daily as needed (mild constipation).    [provider]  ELIQUIS 5 MG TABS tablet TAKE 1 TABLET (5 MG TOTAL) BY MOUTH 2 (TWO) TIMES DAILY. 11/13/16   Wellington Hampshire, MD  esomeprazole (NEXIUM) 40 MG capsule Take 40 mg by mouth daily as needed (for acid reflux).     [provider]  flecainide (TAMBOCOR) 50 MG tablet TAKE 1 TABLET (50 MG TOTAL) BY MOUTH 2 (TWO) TIMES DAILY. 10/30/16   Dunn, Areta Haber, PA-C  furosemide (LASIX) 40 MG tablet TAKE 1 TABLET (40 MG TOTAL) BY MOUTH DAILY. 08/08/16   Jearld Fenton, NP  gabapentin (NEURONTIN) 100 MG capsule Take 1 capsule (100 mg total) by mouth daily as needed. 12/20/16   Jearld Fenton, NP  metoprolol succinate (TOPROL-XL) 25 MG 24 hr tablet Take 2  tablets (50 mg total) by mouth 2 (two) times daily. Take with or immediately following a meal. 12/29/16   Hinda Kehr, MD  silver sulfADIAZINE (SILVADENE) 1 % cream Apply 1 application topically daily. 09/12/16   Jearld Fenton, NP  traMADol (ULTRAM) 50 MG tablet Take 2 tablets (100 mg total) by mouth every 12 (twelve) hours as needed. 07/08/16   Lucille Passy, MD  Vitamin D, Ergocalciferol, (DRISDOL) 50000 units CAPS capsule Take 1 capsule (50,000 Units total) by mouth every 7 (seven) days. 11/07/16   Jearld Fenton, NP    Allergies Penicillins and Vancomycin  Family History  Problem Relation Age of Onset  . Coronary artery disease Mother   . Hyperlipidemia Mother   . Hypertension Mother   . Arthritis Mother   . Coronary artery disease Father   . Heart disease Paternal Grandmother   . Alzheimer's disease Paternal Grandfather     Social History Social History   Tobacco Use  . Smoking status: Never Smoker  . Smokeless tobacco: Never Used  Substance Use Topics  . Alcohol use: Yes    Comment: occassional  . Drug use: No    Review of  Systems Constitutional: No fever/chills Eyes: No visual changes. ENT: No sore throat. Cardiovascular: chest pain and palpitations (rapid heart rate) Respiratory: Denies shortness of breath. Gastrointestinal: No abdominal pain.  No nausea, no vomiting.  No diarrhea.  No constipation. Genitourinary: Negative for dysuria. Musculoskeletal: Negative for neck pain.  Negative for back pain. Integumentary: Negative for rash. Neurological: Negative for headaches, focal weakness or numbness.   ____________________________________________   PHYSICAL EXAM:  VITAL SIGNS: ED Triage Vitals  Enc Vitals Group     BP 12/29/16 0053 126/90     Pulse Rate 12/29/16 0053 (!) 130     Resp 12/29/16 0053 20     Temp 12/29/16 0052 97.9 F (36.6 C)     Temp Source 12/29/16 0052 Oral     SpO2 12/29/16 0053 98 %     Weight 12/29/16 0052 (!) 139.3 kg (307 lb)     Height --      Head Circumference --      Peak Flow --      Pain Score 12/29/16 0051 7     Pain Loc --      Pain Edu? --      Excl. in [place]? --     Constitutional: Alert and oriented. Well appearing and in no acute distress. Anxious. Eyes: Conjunctivae are normal.  Head: Atraumatic. Nose: No congestion/rhinnorhea. Mouth/Throat: Mucous membranes are moist. Neck: No stridor.  No meningeal signs.   Cardiovascular: Irreg. Good peripheral circulation. Grossly normal heart sounds. Respiratory: Normal respiratory effort.  No retractions. Lungs CTAB. Gastrointestinal: Soft and nontender. No distention.  Musculoskeletal: No lower extremity tenderness nor edema. No gross deformities of extremities. Neurologic:  Normal speech and language. No gross focal neurologic deficits are appreciated.  Skin:  Skin is warm, dry and intact. No rash noted. Psychiatric: Mood and affect are normal. Speech and behavior are normal.  ____________________________________________   LABS (all labs ordered are listed, but only abnormal results are displayed)  Labs  Reviewed  BASIC METABOLIC PANEL - Abnormal; Notable for the following components:      Result Value   Glucose, Bld 154 (*)    BUN 25 (*)    Creatinine, Ser 1.43 (*)    Calcium 8.7 (*)    GFR calc non Af Amer 54 (*)    All  other components within normal limits  CBC - Abnormal; Notable for the following components:   WBC 10.8 (*)    Hemoglobin 11.9 (*)    HCT 38.2 (*)    MCV 71.2 (*)    MCH 22.1 (*)    MCHC 31.1 (*)    RDW 18.3 (*)    All other components within normal limits  HEPATIC FUNCTION PANEL - Abnormal; Notable for the following components:   Total Bilirubin 0.1 (*)    Bilirubin, Direct <0.1 (*)    All other components within normal limits  LIPASE, BLOOD - Abnormal; Notable for the following components:   Lipase 161 (*)    All other components within normal limits  TROPONIN I  MAGNESIUM  PROTIME-INR  APTT  TROPONIN I   ____________________________________________  EKG  ED ECG REPORT I, Hinda Kehr, the attending physician, personally viewed and interpreted this ECG.  Date: 12/29/2016 EKG Time: 00:51 Rate: 124 Rhythm: a-fib w/ RVR QRS Axis: normal Intervals: LAFB ST/T Wave abnormalities: Non-specific ST segment / T-wave changes, but no evidence of acute ischemia. Narrative Interpretation: no evidence of acute ischemia   ____________________________________________  RADIOLOGY   Dg Chest Portable 1 View  Result Date: 12/29/2016 CLINICAL DATA:  Chest pain radiating to the back for 20 minutes. Hypertensive. History of atrial fibrillation and pulmonary embolism. EXAM: PORTABLE CHEST 1 VIEW COMPARISON:  Chest radiograph July 19, 2016 FINDINGS: Cardiomediastinal silhouette is normal. No pleural effusions or focal consolidations. Mild bronchitic changes. Trachea projects midline and there is no pneumothorax. Soft tissue planes and included osseous structures are non-suspicious. IMPRESSION: Mild bronchitic changes without focal consolidation. Electronically Signed    By: Elon Alas M.D.   On: 12/29/2016 01:27    ____________________________________________   PROCEDURES  Critical Care performed: Yes, see critical care procedure note(s)   Procedure(s) performed:   .Critical Care Performed by: Hinda Kehr, MD Authorized by: Hinda Kehr, MD   Critical care provider statement:    Critical care time (minutes):  45   Critical care time was exclusive of:  Separately billable procedures and treating other patients   Critical care was necessary to treat or prevent imminent or life-threatening deterioration of the following conditions:  Circulatory failure   Critical care was time spent personally by me on the following activities:  Development of treatment plan with patient or surrogate, discussions with consultants, evaluation of patient's response to treatment, examination of patient, obtaining history from patient or surrogate, ordering and performing treatments and interventions, ordering and review of laboratory studies, ordering and review of radiographic studies, pulse oximetry, re-evaluation of patient's condition and review of old charts     ____________________________________________   INITIAL IMPRESSION / [place] / ED COURSE  As part of my medical decision making, I reviewed the following data within the [facility] notes reviewed and incorporated, Labs reviewed , EKG interpreted  and A phone consult was requested and obtained from this/these consultant(s) (Dr. Percival Spanish)    Differential diagnosis includes, but is not limited to, ACS, A. fib with RVR, infectious process, recurrent pulmonary emboli, etc.  I looked back through the patient's medical record and verified that he is on metoprolol succinate 25 mg p.o. twice daily and flecainide as well as Eliquis.  He reports that he had his IVC filter removed about 4 months ago.  Prior to that, however, he has had 7 CTA chest studies and in no case  other than the original has he had any pulmonary  emboli and his prior ones have shown resolution of the existing ones.  The pain with deep breaths he is experiencing has been going on for 7-8 months.  I explained to him that I did not feel he would benefit from another CTA tonight and that it would be very unlikely that he would have developed a recurrent pulmonary emboli in the setting of all the prior negative scans and being compliant with his Eliquis.  I will attempt rate control with metoprolol tartrate 5 mg IV every 5 minutes x3 doses and then anticipate bridging him with an additional oral medication.  I do not think that he would benefit from inpatient treatment given that he has all the appropriate medications already available to him and has the ability to follow-up with cardiology next week.  I will check a second troponin to rule out evidence of NSTEMI and I will touch base with cardiology by phone to verify this plan.  The patient and his son understand and agree at this time.  Clinical Course as of Dec 30 438  Nancy Fetter Dec 29, 2016  0213 Spoke by phone with Dr. Percival Spanish with [facility] (covering for Dr. Fletcher Anon).  Discussed case in detail.  He agrees with plan for second troponin and oral rate control after the doses of IV metoprolol he has already received; recommended metoprolol succinate 50 now, and increasing home metoprolol succinate from 25 mg PO BID to 50 mg PO BID.  Will continue to monitor.  [CF]  0215 Labs generally reassuring.  Cr at baseline.  Lipase elevated, but patient has no epigastric/upper abdominal tednerness, and LFTs are not otherwise elevated.  Unknown clinical significance to lipase elevated at this time.  [CF]  0228 I went back to update the patient and palpated his abdomen again.  He has no tenderness to palpation of the epigastrium or anywhere on the abdomen to deep palpation.  I do not feel he would benefit from a CT scan of his abdomen at this point and he has no other  signs or symptoms of pancreatitis.  [CF]  0429 Second troponin is negative.  Patient is asymptomatic and his heart rate is consistently been in the mid to upper 50s.  He is comfortable with the plan to go home.  I documented my recommendations in detail.  [CF]  [phone] I gave my usual and customary return precautions  [CF]    Clinical Course User Index [CF] Hinda Kehr, MD    ____________________________________________  FINAL CLINICAL IMPRESSION(S) / ED DIAGNOSES  Final diagnoses:  Atrial fibrillation with RVR ([place])  Chest pain, unspecified type  Elevated lipase     MEDICATIONS GIVEN DURING THIS VISIT:  Medications  metoprolol tartrate (LOPRESSOR) injection 5 mg (5 mg Intravenous Given 12/29/16 0144)  metoprolol succinate (TOPROL-XL) 24 hr tablet 50 mg (50 mg Oral Given 12/29/16 0219)     ED Discharge Orders        Ordered    metoprolol succinate (TOPROL-XL) 25 MG 24 hr tablet  2 times daily     12/29/16 0436       Note:  This document was prepared using Dragon voice recognition software and may include unintentional dictation errors.    Hinda Kehr, MD 12/29/16 [phone]

## 2016-12-29 NOTE — ED Notes (Addendum)
Informed MD Karma Greaser of patient's heart rate

## 2016-12-29 NOTE — ED Notes (Signed)
Reviewed discharge instructions, follow-up care, and prescriptions with patient. Patient verbalized understanding of all information reviewed. Patient stable, with no acute distress noted at this time.

## 2016-12-29 NOTE — ED Triage Notes (Signed)
Patient c/o sternal chest pain radiating to back described as burning beginning 20 minutes ago. Patient reports HR of 140 bpm at home. Patient reports concurrent symptoms of SOB, dizziness, weakness. Patient has hx of afib and PEs.

## 2016-12-29 NOTE — ED Notes (Addendum)
Patient resting.

## 2016-12-29 NOTE — Discharge Instructions (Signed)
You were seen today in the Emergency Department (ED) for atrial fibrillation with a rapid rate.  Your heart rate came down nicely with medication and your lab work and EKG were reassuring.  Please continue taking your regular medications with one exception: In the morning take your regular dose of metoprolol succinate (1 tablet, 25 mg), but when you take your evening dose and from that point forward take 2 tablets twice daily, follow-up with Dr. Fletcher Anon.  Please call on Monday to schedule the next available appointment.  Return to the emergency department if you develop new or worsening symptoms that concern you.  As we discussed, a lab value called your lipase was slightly elevated today as well.  You are not having any acute abdominal pain, nausea, nor vomiting, so this is of unclear significance.  It is very unlikely that you have pancreatitis at this time.  If you develop any new or worsening symptoms such as those described previously, please return to the emergency department.

## 2016-12-29 NOTE — ED Notes (Signed)
MD informed that last dose of lopressor has been given and of patient's current vital signs.   Patient updated on laboratory results and current plan of care

## 2016-12-30 ENCOUNTER — Ambulatory Visit (HOSPITAL_COMMUNITY): Payer: PPO

## 2016-12-30 ENCOUNTER — Other Ambulatory Visit (HOSPITAL_COMMUNITY): Payer: PPO

## 2017-01-02 ENCOUNTER — Ambulatory Visit (INDEPENDENT_AMBULATORY_CARE_PROVIDER_SITE_OTHER): Payer: Managed Care, Other (non HMO) | Admitting: Nurse Practitioner

## 2017-01-02 ENCOUNTER — Encounter: Payer: Self-pay | Admitting: Nurse Practitioner

## 2017-01-02 VITALS — BP 110/70 | HR 56 | Ht 69.0 in | Wt 305.8 lb

## 2017-01-02 DIAGNOSIS — I10 Essential (primary) hypertension: Secondary | ICD-10-CM

## 2017-01-02 DIAGNOSIS — I480 Paroxysmal atrial fibrillation: Secondary | ICD-10-CM

## 2017-01-02 DIAGNOSIS — G4733 Obstructive sleep apnea (adult) (pediatric): Secondary | ICD-10-CM

## 2017-01-02 MED ORDER — METOPROLOL SUCCINATE ER 50 MG PO TB24: 50.0000 mg | ORAL_TABLET | Freq: Two times a day (BID) | ORAL | 3 refills | Status: DC

## 2017-01-02 NOTE — Progress Notes (Signed)
Office Visit    Patient Name: Richard [name] Date of Encounter: 01/02/2017  Primary Care Provider:  Jearld Fenton, NP Primary Cardiologist:  Kathlyn Sacramento, MD  Chief Complaint    56 year old male with a history of abnormal stress test and normal coronary arteries on catheterization in 2015, hypertension, obesity, paroxysmal atrial fibrillation, left lower extremity DVT and bilateral PE on chronic Eliquis, sleep apnea, and arthritis affecting his knees, who presents for follow-up after recent ER visit for A. fib.  Past Medical History    Past Medical History:  Diagnosis Date  . Arthritis   . Arthrofibrosis of total knee arthroplasty ([place]) 05/30/2015  . Bilateral pulmonary embolism ([place])    a. 03/2015 CTA: acute bilat PE; b. IVC filter placed in 2017--> removed 2018.  Marland Kitchen Chest pain    a. 2015 Abnl MV;  b. 2015 Cath: nl cors.  . Chronic diastolic CHF (congestive heart failure) ([place])    a. 03/2015 Echo: EF 55-65%, poor windows, mildly dil RV.  Marland Kitchen DVT (deep venous thrombosis) ([place])    a. 03/2015 U/S: occlusive DVT w/in the distal aspect of the Left fem vein through the L popliteal vein.  Marland Kitchen GERD (gastroesophageal reflux disease)   . Headache   . Hypertension   . Hypertensive heart disease   . Obesity   . OSA (obstructive sleep apnea)    uses CPAP nightly  . PAF (paroxysmal atrial fibrillation) ([place])   . Primary localized osteoarthritis of left knee    a. 11/2014 s/p L TKA.  . Primary localized osteoarthritis of right knee    a. 02/2014 s/p R Partial Knee arthroplasty.  . Stiffness of left knee    a. 12/2014 s/p manipulation under anesthesia.  . Urine incontinence    Past Surgical History:  Procedure Laterality Date  . APPENDECTOMY    . CARDIAC CATHETERIZATION    . EXAM UNDER ANESTHESIA WITH MANIPULATION OF KNEE Left 05/30/2015   Procedure: EXAM UNDER ANESTHESIA WITH MANIPULATION OF LEFT KNEE;  Surgeon: Marchia Bond, MD;  Location: [place];  Service: Orthopedics;  Laterality:  Left;  . I&D KNEE WITH POLY EXCHANGE Left 10/30/2015   Procedure: IRRIGATION AND DEBRIDEMENT KNEE WITH POLY EXCHANGE PLACE SPACERS;  Surgeon: Frederik Pear, MD;  Location: [place];  Service: Orthopedics;  Laterality: Left;  OFF ELIQUIST 3 DAYS  . IVC FILTER PLACEMENT ([facility] HX)  04/13/15  . IVC FILTER REMOVAL N/A 08/13/2016   Procedure: IVC Filter Removal;  Surgeon: Katha Cabal, MD;  Location: [facility];  Service: Cardiovascular;  Laterality: N/A;  . JOINT REPLACEMENT    . KNEE ARTHROSCOPY Left 08/08/2015   Procedure: LEFT KNEE MANIPULATION WITH ARTHROSCOPIC LYSIS OF ADHESIONS AND ASPIRATION;  Surgeon: Marchia Bond, MD;  Location: [place];  Service: Orthopedics;  Laterality: Left;  . KNEE CLOSED REDUCTION Left 01/20/2015   Procedure: LEFT KNEE MANIPULATION;  Surgeon: Marchia Bond, MD;  Location: [place];  Service: Orthopedics;  Laterality: Left;  . LEFT HEART CATHETERIZATION WITH CORONARY ANGIOGRAM N/A 01/12/2014   Procedure: LEFT HEART CATHETERIZATION WITH CORONARY ANGIOGRAM;  Surgeon: Jettie Booze, MD;  Location: [facility];  Service: Cardiovascular;  Laterality: N/A;  . ORTHOPEDIC SURGERY Left    arthroscopy x3  . PARTIAL KNEE ARTHROPLASTY Right 02/25/2014   Procedure: RIGHT KNEE ARTHROPLASTY CONDYLE AND PLATEAU MEDIAL COMPARTMENT ;  Surgeon: Johnny Bridge, MD;  Location: [place];  Service: Orthopedics;  Laterality: Right;  . PERIPHERAL VASCULAR CATHETERIZATION  N/A 03/29/2015   Procedure: IVC Filter Insertion, and possible thrombectomy;  Surgeon: Katha Cabal, MD;  Location: [place] [facility];  Service: Cardiovascular;  Laterality: N/A;  . TOTAL KNEE ARTHROPLASTY  12/06/2014   Procedure: TOTAL KNEE ARTHROPLASTY;  Surgeon: Marchia Bond, MD;  Location: [place];  Service: Orthopedics;;  . TOTAL KNEE REVISION Left 02/08/2016   Procedure: TOTAL KNEE REVISION;  Surgeon: Frederik Pear, MD;  Location: [place];  Service: Orthopedics;  Laterality:  Left;    Allergies  Allergies  Allergen Reactions  . Penicillins Anaphylaxis and Other (See Comments)    Childhood allergy. Pt unsure of reaction Has patient had a PCN reaction causing immediate rash, facial/tongue/throat swelling, SOB or lightheadedness with hypotension: Unknown Has patient had a PCN reaction causing severe rash involving mucus membranes or skin necrosis: Unknown Has patient had a PCN reaction that required hospitalization: Unknown Has patient had a PCN reaction occurring within the last 10 years: No If all of the above answers are "NO", then may proceed with Cephalosporin use.   Marland Kitchen Penicillin G Other (See Comments)    Patient experienced a reaction as a young child and doesn't remember the symptoms.   . Vancomycin Other (See Comments)    [place]' syndrome    History of Present Illness    56 year old male with the above complex past medical history including hypertension, diastolic dysfunction, obesity, sleep apnea, osteoarthritis, DVT following knee surgery complicated by bilateral PE on chronic Eliquis, and history of abnormal stress test with normal coronary arteries by catheterization in 2015.  In the setting of his PE he also had atrial fibrillation with rapid ventricular response, which broke on IV amiodarone.  He has been maintained on flecainide and Eliquis therapy and typically experiences palpitations very rarely.  He thinks maybe he has had an episode of atrial fibrillation once or twice since the initial episode.  On December 9, he was getting ready for bed when he felt his heart racing some.  He was not having any chest pain or dyspnea.  Heart rates rose as result he opted to go to the emergency department.  Other than palpitations, he was not particular symptom medic.  He was found to be in rapid atrial fibrillation.  He was given a few doses of IV metoprolol and subsequently converted to sinus rhythm and he was discharged home and advised to continue flecainide  and his home dose of Toprol was increased to 50 mg twice daily.  Since that ER visit, he has done just fine with no recurrence of palpitations.  Heart rates have been in the 50s at home.  He denies dyspnea, PND, orthopnea, dizziness, syncope, edema, or early satiety.  He does have somewhat chronic pleuritic chest discomfort that occurs when taking a deep breath ever since experiencing a PE in March 2017.  Home Medications    Prior to Admission medications   Medication Sig Start Date End Date Taking? Authorizing Provider  acetaminophen (TYLENOL) 650 MG CR tablet Take 1,300 mg by mouth every 8 (eight) hours as needed for pain.   Yes [provider]  baclofen (LIORESAL) 10 MG tablet TAKE 1-2 TABLETS (10-20 MG TOTAL) BY MOUTH 3 (THREE) TIMES DAILY AS NEEDED FOR MUSCLE SPASMS. 05/06/16  Yes Jearld Fenton, NP  bismuth subsalicylate (PEPTO-BISMOL) 262 MG chewable tablet Chew 2 tablets (524 mg total) by mouth 4 (four) times daily -  before meals and at bedtime. Patient taking differently: Chew 524 mg by mouth 3 (three)  times daily as needed for indigestion.  11/14/15  Yes Baity, Coralie Keens, NP  cyanocobalamin (,VITAMIN B-12,) 1000 MCG/ML injection Inject 1,000 mcg into the muscle every 30 (thirty) days.   Yes [provider]  Docusate Calcium (STOOL SOFTENER PO) Take 1-2 tablets by mouth daily as needed (mild constipation).   Yes [provider]  ELIQUIS 5 MG TABS tablet TAKE 1 TABLET (5 MG TOTAL) BY MOUTH 2 (TWO) TIMES DAILY. 11/13/16  Yes Wellington Hampshire, MD  esomeprazole (NEXIUM) 40 MG capsule Take 40 mg by mouth daily as needed (for acid reflux).    Yes [provider]  flecainide (TAMBOCOR) 50 MG tablet TAKE 1 TABLET (50 MG TOTAL) BY MOUTH 2 (TWO) TIMES DAILY. 10/30/16  Yes Dunn, Areta Haber, PA-C  furosemide (LASIX) 40 MG tablet TAKE 1 TABLET (40 MG TOTAL) BY MOUTH DAILY. 08/08/16  Yes Jearld Fenton, NP  gabapentin (NEURONTIN) 100 MG capsule Take 1 capsule (100 mg  total) by mouth daily as needed. 12/20/16  Yes Baity, Coralie Keens, NP  silver sulfADIAZINE (SILVADENE) 1 % cream Apply 1 application topically daily. 09/12/16  Yes Jearld Fenton, NP  traMADol (ULTRAM) 50 MG tablet Take 2 tablets (100 mg total) by mouth every 12 (twelve) hours as needed. 07/08/16  Yes Lucille Passy, MD  Vitamin D, Ergocalciferol, (DRISDOL) 50000 units CAPS capsule Take 1 capsule (50,000 Units total) by mouth every 7 (seven) days. 11/07/16  Yes Jearld Fenton, NP  metoprolol succinate (TOPROL-XL) 50 MG 24 hr tablet Take 1 tablet (50 mg total) by mouth 2 (two) times daily. Take with or immediately following a meal. 01/02/17 04/02/17  Rogelia Mire, NP    Review of Systems    Palpitations and A. fib noted on December 9.  No recurrence after adjustment of beta-blocker.  Chronic pleuritic chest discomfort dating back to diagnosis of PE in March 2017.  He denies dyspnea, PND, orthopnea, dizziness, syncope, edema, or early satiety.  All other systems reviewed and are otherwise negative except as noted above.  Physical Exam    VS:  BP 110/70 (BP Location: Left Arm, Patient Position: Sitting, Cuff Size: Large)   Pulse (!) 56   Ht 5\' 9"  (1.753 m)   Wt (!) 305 lb 12 oz (138.7 kg)   BMI 45.15 kg/m  , BMI Body mass index is 45.15 kg/m. GEN: Well nourished, well developed, in no acute distress.  HEENT: normal.  Neck: Supple, no JVD, carotid bruits, or masses. Cardiac: RRR, no murmurs, rubs, or gallops. No clubbing, cyanosis, edema.  Radials/DP/PT 2+ and equal bilaterally.  Respiratory:  Respirations regular and unlabored, clear to auscultation bilaterally. GI: Soft, nontender, nondistended, BS + x 4. MS: no deformity or atrophy. Skin: warm and dry, no rash. Neuro:  Strength and sensation are intact. Psych: Normal affect.  Accessory Clinical Findings    ECG -sinus bradycardia, 56, left axis deviation, no acute ST or T changes.  Assessment & Plan    1.  Paroxysmal atrial  fibrillation: Patient was recently in the emergency department secondary to palpitations and was found to be in rapid atrial fibrillation.  Labs were stable.  He converted with IV metoprolol and has since been on a higher dose of metoprolol-50 mg twice daily without recurrence of symptoms.  He remains on flecainide.  ECG stable today.  I recommended we continue with current plan of flecainide and 50 twice daily of metoprolol.  Continue Eliquis.  2.  Essential hypertension: Stable.  Tolerating metoprolol well.  3.  History of PE: March 2017 in the setting of knee surgery and DVT.  On chronic Eliquis.  He has chronic mild pleuritic chest discomfort which is unchanged.  4.  Obstructive sleep apnea: He says he has not been getting as restful sleep over the past few months as he was previously accustomed to.  I did recommend he follow-up with sleep medicine for repeat sleep study and potentially titration of his CPAP.  5.  Morbid obesity: Encouraged increase activity and reduced caloric intake with a goal of 1 pound weight loss per week.  6.  Disposition: Follow-up with Dr. Fletcher Anon in 3 months or sooner if necessary.   Murray Hodgkins, NP 01/02/2017, 4:40 PM

## 2017-01-02 NOTE — Patient Instructions (Addendum)
Medication Instructions: - Metoprolol succinate 50 mg tablets have been sent to the pharmacy for you- please take 1 tablet by mouth TWICE daily  Labwork: - none ordered  Procedures/Testing: - none ordered  Follow-Up: - Your physician recommends that you schedule a follow-up appointment in: 3 months with Dr. Fletcher Anon   Any Additional Special Instructions Will Be Listed Below (If Applicable).     If you need a refill on your cardiac medications before your next appointment, please call your pharmacy.

## 2017-01-06 ENCOUNTER — Telehealth: Payer: Self-pay | Admitting: Cardiovascular Disease

## 2017-01-06 NOTE — Telephone Encounter (Signed)
Patient says his HR drops to 40's at night he is concerned if this normal or ok to happen please call

## 2017-01-06 NOTE — Telephone Encounter (Signed)
Decrease Toprol back to 25 mg twice daily and increase flecainide to 100 mg twice daily.  He needs to be seen in 1 month.

## 2017-01-06 NOTE — Telephone Encounter (Addendum)
Pt in ED 12/9 with afib RVR. He was given IV metoprolol and converted. Discussed with Dr. Percival Spanish and metoprolol succinate 25mg  BID was increased to 50mg  BID. Since increasing metoprolol, pt's HR has been in the mid-40s after PM dose. During this time, he reports feeling dizzy with no energy.  HR during the day mid-50s at rest which is his baseline, increasing to upper 60s with activity.  Advised pt to take only 25mg  metoprolol this evening and await further instruction from MD. Routed to Dr. Fletcher Anon.

## 2017-01-07 ENCOUNTER — Other Ambulatory Visit: Payer: Self-pay

## 2017-01-07 DIAGNOSIS — I2699 Other pulmonary embolism without acute cor pulmonale: Secondary | ICD-10-CM

## 2017-01-07 DIAGNOSIS — R0789 Other chest pain: Secondary | ICD-10-CM

## 2017-01-07 DIAGNOSIS — IMO0001 Reserved for inherently not codable concepts without codable children: Secondary | ICD-10-CM

## 2017-01-07 DIAGNOSIS — I82492 Acute embolism and thrombosis of other specified deep vein of left lower extremity: Secondary | ICD-10-CM

## 2017-01-07 DIAGNOSIS — I480 Paroxysmal atrial fibrillation: Secondary | ICD-10-CM

## 2017-01-07 DIAGNOSIS — Z0389 Encounter for observation for other suspected diseases and conditions ruled out: Secondary | ICD-10-CM

## 2017-01-07 MED ORDER — METOPROLOL SUCCINATE ER 25 MG PO TB24: 25.0000 mg | ORAL_TABLET | Freq: Two times a day (BID) | ORAL | 3 refills | Status: DC

## 2017-01-07 MED ORDER — FLECAINIDE ACETATE 100 MG PO TABS: 100.0000 mg | ORAL_TABLET | Freq: Two times a day (BID) | ORAL | 3 refills | Status: DC

## 2017-01-07 NOTE — Telephone Encounter (Signed)
Reviewed recommendations w/pt who verbalized understanding and is agreeable w/plan. Medication list updated. Routed to Support Pool for f/u appt w/Dr. Fletcher Anon in one month.

## 2017-01-09 ENCOUNTER — Ambulatory Visit: Admission: RE | Admit: 2017-01-09 | Payer: Managed Care, Other (non HMO) | Source: Ambulatory Visit

## 2017-01-09 NOTE — Telephone Encounter (Signed)
Pt is now coming in on 02/21/16 to see Dr Fletcher Anon

## 2017-01-20 ENCOUNTER — Ambulatory Visit
Admission: RE | Admit: 2017-01-20 | Discharge: 2017-01-20 | Disposition: A | Discharge status: Home / Self Care | Payer: Managed Care, Other (non HMO) | Source: Ambulatory Visit | Attending: Orthopedic Surgery | Admitting: Orthopedic Surgery

## 2017-01-20 DIAGNOSIS — R599 Enlarged lymph nodes, unspecified: Secondary | ICD-10-CM | POA: Insufficient documentation

## 2017-01-20 MED ORDER — IOPAMIDOL (ISOVUE-300) INJECTION 61%
125.0000 mL | Freq: Once | INTRAVENOUS | Status: AC | PRN
  Administered 2017-01-20: 125 mL via INTRAVENOUS

## 2017-01-24 DIAGNOSIS — G4733 Obstructive sleep apnea (adult) (pediatric): Secondary | ICD-10-CM | POA: Diagnosis not present

## 2017-01-28 ENCOUNTER — Other Ambulatory Visit: Payer: Self-pay | Admitting: Internal Medicine

## 2017-01-28 DIAGNOSIS — E559 Vitamin D deficiency, unspecified: Secondary | ICD-10-CM

## 2017-02-04 DIAGNOSIS — M47816 Spondylosis without myelopathy or radiculopathy, lumbar region: Secondary | ICD-10-CM | POA: Diagnosis not present

## 2017-02-07 ENCOUNTER — Other Ambulatory Visit: Payer: Self-pay | Admitting: Internal Medicine

## 2017-02-10 ENCOUNTER — Ambulatory Visit (INDEPENDENT_AMBULATORY_CARE_PROVIDER_SITE_OTHER): Payer: Managed Care, Other (non HMO) | Admitting: Cardiovascular Disease

## 2017-02-10 ENCOUNTER — Encounter: Payer: Self-pay | Admitting: Cardiovascular Disease

## 2017-02-10 VITALS — BP 120/64 | HR 57 | Ht 69.0 in | Wt 312.5 lb

## 2017-02-10 DIAGNOSIS — I480 Paroxysmal atrial fibrillation: Secondary | ICD-10-CM

## 2017-02-11 DIAGNOSIS — M47816 Spondylosis without myelopathy or radiculopathy, lumbar region: Secondary | ICD-10-CM | POA: Diagnosis not present

## 2017-02-11 NOTE — Progress Notes (Signed)
Rescheduled due to a STEMI.

## 2017-02-11 NOTE — Telephone Encounter (Signed)
Please advise if pt is to continue medication 

## 2017-02-17 DIAGNOSIS — G4733 Obstructive sleep apnea (adult) (pediatric): Secondary | ICD-10-CM | POA: Diagnosis not present

## 2017-02-17 DIAGNOSIS — I4891 Unspecified atrial fibrillation: Secondary | ICD-10-CM | POA: Diagnosis not present

## 2017-02-17 DIAGNOSIS — I10 Essential (primary) hypertension: Secondary | ICD-10-CM | POA: Diagnosis not present

## 2017-02-20 ENCOUNTER — Ambulatory Visit: Payer: Managed Care, Other (non HMO) | Admitting: Cardiovascular Disease

## 2017-02-25 ENCOUNTER — Emergency Department (HOSPITAL_COMMUNITY)
Admission: EM | Admit: 2017-02-25 | Discharge: 2017-02-25 | Disposition: A | Discharge status: Home / Self Care | Payer: Managed Care, Other (non HMO) | Attending: Emergency Medicine | Admitting: Emergency Medicine

## 2017-02-25 ENCOUNTER — Emergency Department (HOSPITAL_COMMUNITY): Payer: Managed Care, Other (non HMO)

## 2017-02-25 ENCOUNTER — Encounter (HOSPITAL_COMMUNITY): Payer: Self-pay | Admitting: Emergency Medicine

## 2017-02-25 DIAGNOSIS — Y92410 Unspecified street and highway as the place of occurrence of the external cause: Secondary | ICD-10-CM | POA: Diagnosis not present

## 2017-02-25 DIAGNOSIS — I110 Hypertensive heart disease with heart failure: Secondary | ICD-10-CM | POA: Insufficient documentation

## 2017-02-25 DIAGNOSIS — S79922A Unspecified injury of left thigh, initial encounter: Secondary | ICD-10-CM | POA: Diagnosis not present

## 2017-02-25 DIAGNOSIS — Y999 Unspecified external cause status: Secondary | ICD-10-CM | POA: Diagnosis not present

## 2017-02-25 DIAGNOSIS — S161XXA Strain of muscle, fascia and tendon at neck level, initial encounter: Secondary | ICD-10-CM | POA: Diagnosis not present

## 2017-02-25 DIAGNOSIS — I5032 Chronic diastolic (congestive) heart failure: Secondary | ICD-10-CM | POA: Insufficient documentation

## 2017-02-25 DIAGNOSIS — S299XXA Unspecified injury of thorax, initial encounter: Secondary | ICD-10-CM | POA: Insufficient documentation

## 2017-02-25 DIAGNOSIS — S199XXA Unspecified injury of neck, initial encounter: Secondary | ICD-10-CM | POA: Diagnosis not present

## 2017-02-25 DIAGNOSIS — S3991XA Unspecified injury of abdomen, initial encounter: Secondary | ICD-10-CM | POA: Diagnosis not present

## 2017-02-25 DIAGNOSIS — Y9389 Activity, other specified: Secondary | ICD-10-CM | POA: Insufficient documentation

## 2017-02-25 DIAGNOSIS — R52 Pain, unspecified: Secondary | ICD-10-CM | POA: Diagnosis not present

## 2017-02-25 DIAGNOSIS — R51 Headache: Secondary | ICD-10-CM | POA: Diagnosis not present

## 2017-02-25 DIAGNOSIS — S92425A Nondisplaced fracture of distal phalanx of left great toe, initial encounter for closed fracture: Secondary | ICD-10-CM

## 2017-02-25 DIAGNOSIS — S92415A Nondisplaced fracture of proximal phalanx of left great toe, initial encounter for closed fracture: Secondary | ICD-10-CM | POA: Diagnosis not present

## 2017-02-25 DIAGNOSIS — S99912A Unspecified injury of left ankle, initial encounter: Secondary | ICD-10-CM | POA: Diagnosis not present

## 2017-02-25 DIAGNOSIS — Z79899 Other long term (current) drug therapy: Secondary | ICD-10-CM | POA: Insufficient documentation

## 2017-02-25 DIAGNOSIS — M542 Cervicalgia: Secondary | ICD-10-CM | POA: Diagnosis not present

## 2017-02-25 DIAGNOSIS — M79652 Pain in left thigh: Secondary | ICD-10-CM | POA: Diagnosis not present

## 2017-02-25 DIAGNOSIS — S0990XA Unspecified injury of head, initial encounter: Secondary | ICD-10-CM | POA: Diagnosis not present

## 2017-02-25 DIAGNOSIS — M25572 Pain in left ankle and joints of left foot: Secondary | ICD-10-CM | POA: Diagnosis not present

## 2017-02-25 LAB — I-STAT CHEM 8, ED
BUN: 17 mg/dL (ref 6–20)
Calcium, Ion: 1.22 mmol/L (ref 1.15–1.40)
Chloride: 107 mmol/L (ref 101–111)
Creatinine, Ser: 1.4 mg/dL — ABNORMAL HIGH (ref 0.61–1.24)
Glucose, Bld: 94 mg/dL (ref 65–99)
HCT: 40 % (ref 39.0–52.0)
Hemoglobin: 13.6 g/dL (ref 13.0–17.0)
Potassium: 4.1 mmol/L (ref 3.5–5.1)
Sodium: 142 mmol/L (ref 135–145)
TCO2: 24 mmol/L (ref 22–32)

## 2017-02-25 MED ORDER — IOPAMIDOL (ISOVUE-300) INJECTION 61%
INTRAVENOUS | Status: AC
  Administered 2017-02-25: 100 mL
  Filled 2017-02-25: qty 100

## 2017-02-25 MED ORDER — OXYCODONE-ACETAMINOPHEN 5-325 MG PO TABS
1.0000 | ORAL_TABLET | Freq: Once | ORAL | Status: AC
  Administered 2017-02-25: 1 via ORAL
  Filled 2017-02-25: qty 1

## 2017-02-25 MED ORDER — HYDROCODONE-ACETAMINOPHEN 5-325 MG PO TABS: 1.0000 | ORAL_TABLET | Freq: Four times a day (QID) | ORAL | 0 refills | Status: DC | PRN
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
## 2017-02-25 NOTE — ED Notes (Signed)
RN called ortho tech

## 2017-02-25 NOTE — ED Notes (Signed)
Patient transported to X-ray 

## 2017-02-25 NOTE — Discharge Instructions (Addendum)
Return here as needed.  Follow-up wit your doctor.  Follow-up with the orthopedist provided or your orthopedist.

## 2017-02-25 NOTE — Progress Notes (Signed)
Orthopedic Tech Progress Note Patient Details:  Richard [name] 06/24/1960 [id number]  Ortho Devices Type of Ortho Device: Postop shoe/boot, Crutches Ortho Device/Splint Interventions: Application   Post Interventions Patient Tolerated: Well, Ambulated well Instructions Provided: Care of device, Adjustment of device   Richard [name] 02/25/2017, 7:01 PM

## 2017-03-06 ENCOUNTER — Ambulatory Visit: Payer: Managed Care, Other (non HMO) | Admitting: Cardiovascular Disease

## 2017-03-11 ENCOUNTER — Ambulatory Visit (INDEPENDENT_AMBULATORY_CARE_PROVIDER_SITE_OTHER): Payer: Managed Care, Other (non HMO) | Admitting: Internal Medicine

## 2017-03-11 ENCOUNTER — Encounter: Payer: Self-pay | Admitting: Internal Medicine

## 2017-03-11 VITALS — BP 122/78 | HR 51 | Temp 98.0°F | Wt 309.0 lb

## 2017-03-11 DIAGNOSIS — J069 Acute upper respiratory infection, unspecified: Secondary | ICD-10-CM | POA: Diagnosis not present

## 2017-03-11 DIAGNOSIS — M25551 Pain in right hip: Secondary | ICD-10-CM

## 2017-03-11 DIAGNOSIS — M25552 Pain in left hip: Secondary | ICD-10-CM

## 2017-03-11 DIAGNOSIS — S7012XD Contusion of left thigh, subsequent encounter: Secondary | ICD-10-CM

## 2017-03-11 DIAGNOSIS — S92405D Nondisplaced unspecified fracture of left great toe, subsequent encounter for fracture with routine healing: Secondary | ICD-10-CM | POA: Diagnosis not present
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
## 2017-03-12 MED ORDER — METHYLPREDNISOLONE ACETATE 80 MG/ML IJ SUSP
80.0000 mg | Freq: Once | INTRAMUSCULAR | Status: AC
  Administered 2017-03-11: 80 mg via INTRAMUSCULAR

## 2017-03-12 NOTE — Patient Instructions (Signed)
Upper Respiratory Infection, Adult Most upper respiratory infections (URIs) are caused by a virus. A URI affects the nose, throat, and upper air passages. The most common type of URI is often called "the common cold." Follow these instructions at home:  Take medicines only as told by your doctor.  Gargle warm saltwater or take cough drops to comfort your throat as told by your doctor.  Use a warm mist humidifier or inhale steam from a shower to increase air moisture. This may make it easier to breathe.  Drink enough fluid to keep your pee (urine) clear or pale yellow.  Eat soups and other clear broths.  Have a healthy diet.  Rest as needed.  Go back to work when your fever is gone or your doctor says it is okay. ? You may need to stay home longer to avoid giving your URI to others. ? You can also wear a face mask and wash your hands often to prevent spread of the virus.  Use your inhaler more if you have asthma.  Do not use any tobacco products, including cigarettes, chewing tobacco, or electronic cigarettes. If you need help quitting, ask your doctor. Contact a doctor if:  You are getting worse, not better.  Your symptoms are not helped by medicine.  You have chills.  You are getting more short of breath.  You have brown or red mucus.  You have yellow or brown discharge from your nose.  You have pain in your face, especially when you bend forward.  You have a fever.  You have puffy (swollen) neck glands.  You have pain while swallowing.  You have white areas in the back of your throat. Get help right away if:  You have very bad or constant: ? Headache. ? Ear pain. ? Pain in your forehead, behind your eyes, and over your cheekbones (sinus pain). ? Chest pain.  You have long-lasting (chronic) lung disease and any of the following: ? Wheezing. ? Long-lasting cough. ? Coughing up blood. ? A change in your usual mucus.  You have a stiff neck.  You have  changes in your: ? Vision. ? Hearing. ? Thinking. ? Mood. This information is not intended to replace advice given to you by your health care provider. Make sure you discuss any questions you have with your health care provider. Document Released: 06/26/2007 Document Revised: 09/10/2015 Document Reviewed: 04/14/2013 Elsevier Interactive Patient Education  2018 Elsevier Inc.  

## 2017-03-12 NOTE — Addendum Note (Signed)
Addended by: Lurlean Nanny on: 03/12/2017 04:59 PM   Modules accepted: Orders

## 2017-03-13 DIAGNOSIS — M25562 Pain in left knee: Secondary | ICD-10-CM | POA: Diagnosis not present

## 2017-03-13 DIAGNOSIS — M79672 Pain in left foot: Secondary | ICD-10-CM | POA: Diagnosis not present

## 2017-03-24 ENCOUNTER — Other Ambulatory Visit: Payer: Self-pay | Admitting: Internal Medicine

## 2017-03-25 ENCOUNTER — Encounter: Payer: Self-pay | Admitting: Nurse Practitioner

## 2017-03-25 ENCOUNTER — Ambulatory Visit (INDEPENDENT_AMBULATORY_CARE_PROVIDER_SITE_OTHER): Payer: Managed Care, Other (non HMO) | Admitting: Nurse Practitioner

## 2017-03-25 VITALS — BP 120/62 | HR 58 | Ht 69.0 in | Wt 307.2 lb

## 2017-03-25 DIAGNOSIS — Z86711 Personal history of pulmonary embolism: Secondary | ICD-10-CM | POA: Diagnosis not present

## 2017-03-25 DIAGNOSIS — R0789 Other chest pain: Secondary | ICD-10-CM

## 2017-03-25 DIAGNOSIS — I480 Paroxysmal atrial fibrillation: Secondary | ICD-10-CM | POA: Diagnosis not present

## 2017-03-25 DIAGNOSIS — I10 Essential (primary) hypertension: Secondary | ICD-10-CM
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
## 2017-03-25 NOTE — Patient Instructions (Signed)
Medication Instructions:  Your physician recommends that you continue on your current medications as directed. Please refer to the Current Medication list given to you today.   Labwork: NONE  Testing/Procedures: NONE  Follow-Up: Your physician recommends that you schedule a follow-up appointment in: 4 MONTHS WITH DR ARIDA.   If you need a refill on your cardiac medications before your next appointment, please call your pharmacy.   

## 2017-03-31 NOTE — Telephone Encounter (Signed)
Will call pt to verify if med is helping with Sx

## 2017-04-03 ENCOUNTER — Ambulatory Visit: Payer: Managed Care, Other (non HMO) | Admitting: Cardiovascular Disease

## 2017-04-03 DIAGNOSIS — M25562 Pain in left knee: Secondary | ICD-10-CM | POA: Diagnosis not present

## 2017-04-17 ENCOUNTER — Other Ambulatory Visit: Payer: Self-pay | Admitting: Internal Medicine

## 2017-04-17 DIAGNOSIS — M47816 Spondylosis without myelopathy or radiculopathy, lumbar region: Secondary | ICD-10-CM | POA: Diagnosis not present

## 2017-04-17 NOTE — Telephone Encounter (Signed)
Copied from [place] [id number]. Topic: Quick Communication - Rx Refill/Question >> Apr 17, 2017 12:13 PM Waylan Rocher, Lumin L wrote: Medication: traMADol (ULTRAM) 50 MG tablet Has the patient contacted their pharmacy? Yes.   (Agent: If no, request that the patient contact the pharmacy for the refill.) Preferred Pharmacy (with phone number or street name): [id number] [place], [place] - [address] [place] [address] Phone: [phone] Fax: [phone] Agent: Please be advised that RX refills may take up to 3 business days. We ask that you follow-up with your pharmacy.

## 2017-04-17 NOTE — Telephone Encounter (Signed)
Request for refill of Tramadol. Previously prescribed by another provider.   LOV: 03/11/17  Richard [name]  [facility] [address]

## 2017-04-17 NOTE — Telephone Encounter (Signed)
Requesting refill tramadol to [facility]; pt last seen 03/11/17 for Hospital f/u; last refilled # 120 on 07/08/16.Please advise.

## 2017-04-18 MED ORDER — TRAMADOL HCL 50 MG PO TABS: 100.0000 mg | ORAL_TABLET | Freq: Two times a day (BID) | ORAL | 0 refills | Status: DC | PRN

## 2017-04-24 DIAGNOSIS — Z96652 Presence of left artificial knee joint: Secondary | ICD-10-CM | POA: Diagnosis not present

## 2017-04-24 DIAGNOSIS — Z09 Encounter for follow-up examination after completed treatment for conditions other than malignant neoplasm: Secondary | ICD-10-CM | POA: Diagnosis not present

## 2017-04-24 DIAGNOSIS — M79671 Pain in right foot: Secondary | ICD-10-CM | POA: Diagnosis not present

## 2017-04-24 DIAGNOSIS — M25562 Pain in left knee: Secondary | ICD-10-CM | POA: Diagnosis not present

## 2017-05-05 DIAGNOSIS — M47816 Spondylosis without myelopathy or radiculopathy, lumbar region: Secondary | ICD-10-CM | POA: Diagnosis not present

## 2017-05-10 ENCOUNTER — Other Ambulatory Visit: Payer: Self-pay | Admitting: Cardiovascular Disease

## 2017-05-10 DIAGNOSIS — I480 Paroxysmal atrial fibrillation: Secondary | ICD-10-CM

## 2017-05-10 DIAGNOSIS — I82492 Acute embolism and thrombosis of other specified deep vein of left lower extremity: Secondary | ICD-10-CM

## 2017-05-10 DIAGNOSIS — I2699 Other pulmonary embolism without acute cor pulmonale: Secondary | ICD-10-CM

## 2017-05-10 DIAGNOSIS — IMO0001 Reserved for inherently not codable concepts without codable children: Secondary | ICD-10-CM

## 2017-05-10 DIAGNOSIS — Z0389 Encounter for observation for other suspected diseases and conditions ruled out: Secondary | ICD-10-CM

## 2017-05-10 DIAGNOSIS — R0789 Other chest pain: Secondary | ICD-10-CM

## 2017-07-14 ENCOUNTER — Ambulatory Visit (INDEPENDENT_AMBULATORY_CARE_PROVIDER_SITE_OTHER): Payer: Managed Care, Other (non HMO) | Admitting: Internal Medicine

## 2017-07-14 ENCOUNTER — Encounter: Payer: Self-pay | Admitting: Internal Medicine

## 2017-07-14 VITALS — BP 124/80 | HR 60 | Temp 98.0°F | Wt 298.0 lb

## 2017-07-14 DIAGNOSIS — M25512 Pain in left shoulder: Secondary | ICD-10-CM

## 2017-07-14 DIAGNOSIS — M19012 Primary osteoarthritis, left shoulder: Secondary | ICD-10-CM | POA: Diagnosis not present

## 2017-07-14 MED ORDER — TRAMADOL HCL 50 MG PO TABS: 100.0000 mg | ORAL_TABLET | Freq: Every evening | ORAL | 0 refills | Status: DC | PRN

## 2017-07-14 NOTE — Progress Notes (Signed)
Subjective:    Patient ID: Richard [name], male    DOB: 12/15/1960, 57 y.o.   MRN: [id number]  HPI  Pt presents to the clinic today with c/o left shoulder pain. This started 1 month ago. He describes the pain as sharp and stabbing. The pain does not radiates. He does have some weakness. He denies numbness, tingling. Xray of the left shoulder from 04/2015 showed:  IMPRESSION: 1. No fracture or subluxation of the left shoulder. 2. Moderate acromioclavicular joint osteoarthritis.  He went to a chiropractor about 2 weeks ago for the same. He got a shoulder adjustment and he thinks he messed his shoulder up worse. He has taken Tramadol with some relief and would like a refill of this today.  Review of Systems  Past Medical History:  Diagnosis Date  . Arthritis   . Arthrofibrosis of total knee arthroplasty ([place]) 05/30/2015  . Bilateral pulmonary embolism ([place])    a. 03/2015 CTA: acute bilat PE; b. IVC filter placed in 2017--> removed 2018.  Marland Kitchen Chest pain    a. 2015 Abnl MV;  b. 2015 Cath: nl cors.  . Chronic diastolic CHF (congestive heart failure) ([place])    a. 03/2015 Echo: EF 55-65%, poor windows, mildly dil RV.  Marland Kitchen DVT (deep venous thrombosis) ([place])    a. 03/2015 U/S: occlusive DVT w/in the distal aspect of the Left fem vein through the L popliteal vein.  Marland Kitchen GERD (gastroesophageal reflux disease)   . Headache   . Hypertensive heart disease   . Obesity   . OSA (obstructive sleep apnea)    uses CPAP nightly  . PAF (paroxysmal atrial fibrillation) ([place])   . Primary localized osteoarthritis of left knee    a. 11/2014 s/p L TKA.  . Primary localized osteoarthritis of right knee    a. 02/2014 s/p R Partial Knee arthroplasty.  . Stiffness of left knee    a. 12/2014 s/p manipulation under anesthesia.  . Urine incontinence     Current Outpatient Medications  Medication Sig Dispense Refill  . acetaminophen (TYLENOL) 650 MG CR tablet Take 1,300 mg by mouth every 8 (eight) hours as needed for  pain.    . baclofen (LIORESAL) 10 MG tablet TAKE 1-2 TABLETS (10-20 MG TOTAL) BY MOUTH 3 (THREE) TIMES DAILY AS NEEDED FOR MUSCLE SPASMS. 180 tablet 0  . bismuth subsalicylate (PEPTO-BISMOL) 262 MG chewable tablet Chew 2 tablets (524 mg total) by mouth 4 (four) times daily -  before meals and at bedtime. (Patient taking differently: Chew 524 mg by mouth 3 (three) times daily as needed for indigestion. ) 112 tablet 0  . cyanocobalamin (,VITAMIN B-12,) 1000 MCG/ML injection Inject 1,000 mcg into the muscle every 30 (thirty) days.    Marland Kitchen docusate sodium (COLACE) 100 MG capsule Take 100-200 mg by mouth 2 (two) times daily as needed for mild constipation.    Marland Kitchen ELIQUIS 5 MG TABS tablet TAKE 1 TABLET (5 MG TOTAL) BY MOUTH 2 (TWO) TIMES DAILY. 180 tablet 3  . esomeprazole (NEXIUM) 40 MG capsule Take 40 mg by mouth daily as needed (for acid reflux).     . flecainide (TAMBOCOR) 100 MG tablet TAKE 1 TABLET BY MOUTH TWICE A DAY 60 tablet 3  . furosemide (LASIX) 40 MG tablet TAKE 1 TABLET BY MOUTH EVERY DAY (Patient taking differently: Take 40 mg by mouth every other day) 30 tablet 5  . gabapentin (NEURONTIN) 100 MG capsule Take 1 capsule (100 mg total) by mouth daily as needed. (  Patient taking differently: Take 100 mg by mouth daily as needed (for hand pain). ) 30 capsule 2  . HYDROcodone-acetaminophen (NORCO/VICODIN) 5-325 MG tablet Take 1 tablet by mouth every 6 (six) hours as needed for moderate pain. 15 tablet 0  . metoprolol succinate (TOPROL-XL) 25 MG 24 hr tablet Take 1 tablet (25 mg total) by mouth 2 (two) times daily. Take with or immediately following a meal. 60 tablet 3  . silver sulfADIAZINE (SILVADENE) 1 % cream Apply 1 application topically daily. 50 g 0  . traMADol (ULTRAM) 50 MG tablet Take 2 tablets (100 mg total) by mouth every 12 (twelve) hours as needed. 120 tablet 0   No current facility-administered medications for this visit.     Allergies  Allergen Reactions  . Penicillin G Other (See  Comments)    From childhood; uncertain of reaction Has patient had a PCN reaction causing immediate rash, facial/tongue/throat swelling, SOB or lightheadedness with hypotension: Unk Has patient had a PCN reaction causing severe rash involving mucus membranes or skin necrosis: Unk Has patient had a PCN reaction that required hospitalization: Unk Has patient had a PCN reaction occurring within the last 10 years: No If all of the above answers are "NO", then may proceed with Cephalosporin use.  Marland Kitchen Penicillins Anaphylaxis and Other (See Comments)    From childhood; uncertain of reaction Has patient had a PCN reaction causing immediate rash, facial/tongue/throat swelling, SOB or lightheadedness with hypotension: Unk Has patient had a PCN reaction causing severe rash involving mucus membranes or skin necrosis: Unk Has patient had a PCN reaction that required hospitalization: Unk Has patient had a PCN reaction occurring within the last 10 years: No If all of the above answers are "NO", then may proceed with Cephalosporin use.  . Vancomycin Other (See Comments)    Red Mans' syndrome    Family History  Problem Relation Age of Onset  . Coronary artery disease Mother   . Hyperlipidemia Mother   . Hypertension Mother   . Arthritis Mother   . Coronary artery disease Father   . Heart disease Paternal Grandmother   . Alzheimer's disease Paternal Grandfather     Social History   Socioeconomic History  . Marital status: Married    Spouse name: Not on file  . Number of children: Not on file  . Years of education: Not on file  . Highest education level: Not on file  Occupational History  . Occupation: Energy manager: [employer]  Social Needs  . Financial resource strain: Not on file  . Food insecurity:    Worry: Not on file    Inability: Not on file  . Transportation needs:    Medical: Not on file    Non-medical: Not on file  Tobacco Use  . Smoking status: Never Smoker  . Smokeless  tobacco: Never Used  Substance and Sexual Activity  . Alcohol use: Yes    Comment: occassional  . Drug use: No  . Sexual activity: Yes  Lifestyle  . Physical activity:    Days per week: Not on file    Minutes per session: Not on file  . Stress: Not on file  Relationships  . Social connections:    Talks on phone: Not on file    Gets together: Not on file    Attends religious service: Not on file    Active member of club or organization: Not on file    Attends meetings of clubs or organizations: Not  on file    Relationship status: Not on file  . Intimate partner violence:    Fear of current or ex partner: Not on file    Emotionally abused: Not on file    Physically abused: Not on file    Forced sexual activity: Not on file  Other Topics Concern  . Not on file  Social History Narrative  . Not on file     Constitutional: Denies fever, malaise, fatigue, headache or abrupt weight changes.  Musculoskeletal: Pt reports left shoulder pain. Denies difficulty with gait, muscle pain or joint swelling.  Skin: Denies redness, rashes, lesions or ulcercations.  Neurological: Denies dizziness, difficulty with memory, difficulty with speech or problems with balance and coordination.   No other specific complaints in a complete review of systems (except as listed in HPI above).     Objective:   Physical Exam  BP 124/80   Pulse 60   Temp 98 F (36.7 C) (Oral)   Wt 298 lb (135.2 kg)   SpO2 98%   BMI 44.01 kg/m  Wt Readings from Last 3 Encounters:  07/14/17 298 lb (135.2 kg)  03/25/17 (!) 307 lb 4 oz (139.4 kg)  03/11/17 (!) 309 lb (140.2 kg)    General: Appears his stated age, obese in NAD. Cardiovascular: Radial pulse 2+ on the left. Musculoskeletal: Pain with external rotation. Normal internal rotation. Positive drop can on the left. Pain with palpation over the left AC joint. Hand grips equal Neurological: Alert and oriented. Sensation intact to LUE.   BMET    Component  Value Date/Time   NA 142 02/25/2017 1557   K 4.1 02/25/2017 1557   CL 107 02/25/2017 1557   CO2 23 12/29/2016 0059   GLUCOSE 94 02/25/2017 1557   BUN 17 02/25/2017 1557   CREATININE 1.40 (H) 02/25/2017 1557   CREATININE 1.27 12/06/2015 1600   CALCIUM 8.7 (L) 12/29/2016 0059   GFRNONAA 54 (L) 12/29/2016 0059   GFRAA >60 12/29/2016 0059    Lipid Panel     Component Value Date/Time   CHOL 124 03/29/2015 0434   TRIG 71 03/29/2015 0434   HDL 30 (L) 03/29/2015 0434   CHOLHDL 4.1 03/29/2015 0434   VLDL 14 03/29/2015 0434   LDLCALC 80 03/29/2015 0434    CBC    Component Value Date/Time   WBC 10.8 (H) 12/29/2016 0059   RBC 5.36 12/29/2016 0059   HGB 13.6 02/25/2017 1557   HCT 40.0 02/25/2017 1557   PLT 224 12/29/2016 0059   MCV 71.2 (L) 12/29/2016 0059   MCH 22.1 (L) 12/29/2016 0059   MCHC 31.1 (L) 12/29/2016 0059   RDW 18.3 (H) 12/29/2016 0059   LYMPHSABS 1.8 07/19/2016 1333   MONOABS 0.6 07/19/2016 1333   EOSABS 0.1 07/19/2016 1333   BASOSABS 0.0 07/19/2016 1333    Hgb A1C Lab Results  Component Value Date   HGBA1C 6.0 11/05/2016             Assessment & Plan:   Left Shoulder Pain:  Concerning for a rotator cuff tear Shoulder exercises given He declines repeating xray of left shoulder today Discussed Cortisone injection and referral to PT- he will call Dr. Mayer Camel to discuss Tramadol refilled today  Return precautions discussed Webb Silversmith, NP

## 2017-07-14 NOTE — Patient Instructions (Signed)
Shoulder Exercises Ask your health care provider which exercises are safe for you. Do exercises exactly as told by your health care provider and adjust them as directed. It is normal to feel mild stretching, pulling, tightness, or discomfort as you do these exercises, but you should stop right away if you feel sudden pain or your pain gets worse.Do not begin these exercises until told by your health care provider. RANGE OF MOTION EXERCISES These exercises warm up your muscles and joints and improve the movement and flexibility of your shoulder. These exercises also help to relieve pain, numbness, and tingling. These exercises involve stretching your injured shoulder directly. Exercise A: Pendulum  1. Stand near a wall or a surface that you can hold onto for balance. 2. Bend at the waist and let your left / right arm hang straight down. Use your other arm to support you. Keep your back straight and do not lock your knees. 3. Relax your left / right arm and shoulder muscles, and move your hips and your trunk so your left / right arm swings freely. Your arm should swing because of the motion of your body, not because you are using your arm or shoulder muscles. 4. Keep moving your body so your arm swings in the following directions, as told by your health care provider: ? Side to side. ? Forward and backward. ? In clockwise and counterclockwise circles. 5. Continue each motion for __________ seconds, or for as long as told by your health care provider. 6. Slowly return to the starting position. Repeat __________ times. Complete this exercise __________ times a day. Exercise B:Flexion, Standing  1. Stand and hold a broomstick, a cane, or a similar object. Place your hands a little more than shoulder-width apart on the object. Your left / right hand should be palm-up, and your other hand should be palm-down. 2. Keep your elbow straight and keep your shoulder muscles relaxed. Push the stick down with  your healthy arm to raise your left / right arm in front of your body, and then over your head until you feel a stretch in your shoulder. ? Avoid shrugging your shoulder while you raise your arm. Keep your shoulder blade tucked down toward the middle of your back. 3. Hold for __________ seconds. 4. Slowly return to the starting position. Repeat __________ times. Complete this exercise __________ times a day. Exercise C: Abduction, Standing 1. Stand and hold a broomstick, a cane, or a similar object. Place your hands a little more than shoulder-width apart on the object. Your left / right hand should be palm-up, and your other hand should be palm-down. 2. While keeping your elbow straight and your shoulder muscles relaxed, push the stick across your body toward your left / right side. Raise your left / right arm to the side of your body and then over your head until you feel a stretch in your shoulder. ? Do not raise your arm above shoulder height, unless your health care provider tells you to do that. ? Avoid shrugging your shoulder while you raise your arm. Keep your shoulder blade tucked down toward the middle of your back. 3. Hold for __________ seconds. 4. Slowly return to the starting position. Repeat __________ times. Complete this exercise __________ times a day. Exercise D:Internal Rotation  1. Place your left / right hand behind your back, palm-up. 2. Use your other hand to dangle an exercise band, a towel, or a similar object over your shoulder. Grasp the band with   your left / right hand so you are holding onto both ends. 3. Gently pull up on the band until you feel a stretch in the front of your left / right shoulder. ? Avoid shrugging your shoulder while you raise your arm. Keep your shoulder blade tucked down toward the middle of your back. 4. Hold for __________ seconds. 5. Release the stretch by letting go of the band and lowering your hands. Repeat __________ times. Complete  this exercise __________ times a day. STRETCHING EXERCISES These exercises warm up your muscles and joints and improve the movement and flexibility of your shoulder. These exercises also help to relieve pain, numbness, and tingling. These exercises are done using your healthy shoulder to help stretch the muscles of your injured shoulder. Exercise E: Corner Stretch (External Rotation and Abduction)  1. Stand in a doorway with one of your feet slightly in front of the other. This is called a staggered stance. If you cannot reach your forearms to the door frame, stand facing a corner of a room. 2. Choose one of the following positions as told by your health care provider: ? Place your hands and forearms on the door frame above your head. ? Place your hands and forearms on the door frame at the height of your head. ? Place your hands on the door frame at the height of your elbows. 3. Slowly move your weight onto your front foot until you feel a stretch across your chest and in the front of your shoulders. Keep your head and chest upright and keep your abdominal muscles tight. 4. Hold for __________ seconds. 5. To release the stretch, shift your weight to your back foot. Repeat __________ times. Complete this stretch __________ times a day. Exercise F:Extension, Standing 1. Stand and hold a broomstick, a cane, or a similar object behind your back. ? Your hands should be a little wider than shoulder-width apart. ? Your palms should face away from your back. 2. Keeping your elbows straight and keeping your shoulder muscles relaxed, move the stick away from your body until you feel a stretch in your shoulder. ? Avoid shrugging your shoulders while you move the stick. Keep your shoulder blade tucked down toward the middle of your back. 3. Hold for __________ seconds. 4. Slowly return to the starting position. Repeat __________ times. Complete this exercise __________ times a day. STRENGTHENING  EXERCISES These exercises build strength and endurance in your shoulder. Endurance is the ability to use your muscles for a long time, even after they get tired. Exercise G:External Rotation  1. Sit in a stable chair without armrests. 2. Secure an exercise band at elbow height on your left / right side. 3. Place a soft object, such as a folded towel or a small pillow, between your left / right upper arm and your body to move your elbow a few inches away (about 10 cm) from your side. 4. Hold the end of the band so it is tight and there is no slack. 5. Keeping your elbow pressed against the soft object, move your left / right forearm out, away from your abdomen. Keep your body steady so only your forearm moves. 6. Hold for __________ seconds. 7. Slowly return to the starting position. Repeat __________ times. Complete this exercise __________ times a day. Exercise H:Shoulder Abduction  1. Sit in a stable chair without armrests, or stand. 2. Hold a __________ weight in your left / right hand, or hold an exercise band with both hands.   3. Start with your arms straight down and your left / right palm facing in, toward your body. 4. Slowly lift your left / right hand out to your side. Do not lift your hand above shoulder height unless your health care provider tells you that this is safe. ? Keep your arms straight. ? Avoid shrugging your shoulder while you do this movement. Keep your shoulder blade tucked down toward the middle of your back. 5. Hold for __________ seconds. 6. Slowly lower your arm, and return to the starting position. Repeat __________ times. Complete this exercise __________ times a day. Exercise I:Shoulder Extension 1. Sit in a stable chair without armrests, or stand. 2. Secure an exercise band to a stable object in front of you where it is at shoulder height. 3. Hold one end of the exercise band in each hand. Your palms should face each other. 4. Straighten your elbows and  lift your hands up to shoulder height. 5. Step back, away from the secured end of the exercise band, until the band is tight and there is no slack. 6. Squeeze your shoulder blades together as you pull your hands down to the sides of your thighs. Stop when your hands are straight down by your sides. Do not let your hands go behind your body. 7. Hold for __________ seconds. 8. Slowly return to the starting position. Repeat __________ times. Complete this exercise __________ times a day. Exercise J:Standing Shoulder Row 1. Sit in a stable chair without armrests, or stand. 2. Secure an exercise band to a stable object in front of you so it is at waist height. 3. Hold one end of the exercise band in each hand. Your palms should be in a thumbs-up position. 4. Bend each of your elbows to an "L" shape (about 90 degrees) and keep your upper arms at your sides. 5. Step back until the band is tight and there is no slack. 6. Slowly pull your elbows back behind you. 7. Hold for __________ seconds. 8. Slowly return to the starting position. Repeat __________ times. Complete this exercise __________ times a day. Exercise K:Shoulder Press-Ups  1. Sit in a stable chair that has armrests. Sit upright, with your feet flat on the floor. 2. Put your hands on the armrests so your elbows are bent and your fingers are pointing forward. Your hands should be about even with the sides of your body. 3. Push down on the armrests and use your arms to lift yourself off of the chair. Straighten your elbows and lift yourself up as much as you comfortably can. ? Move your shoulder blades down, and avoid letting your shoulders move up toward your ears. ? Keep your feet on the ground. As you get stronger, your feet should support less of your body weight as you lift yourself up. 4. Hold for __________ seconds. 5. Slowly lower yourself back into the chair. Repeat __________ times. Complete this exercise __________ times a  day. Exercise L: Wall Push-Ups  1. Stand so you are facing a stable wall. Your feet should be about one arm-length away from the wall. 2. Lean forward and place your palms on the wall at shoulder height. 3. Keep your feet flat on the floor as you bend your elbows and lean forward toward the wall. 4. Hold for __________ seconds. 5. Straighten your elbows to push yourself back to the starting position. Repeat __________ times. Complete this exercise __________ times a day. This information is not intended to replace advice   given to you by your health care provider. Make sure you discuss any questions you have with your health care provider. Document Released: 11/21/2004 Document Revised: 10/02/2015 Document Reviewed: 09/18/2014 Elsevier Interactive Patient Education  2018 [employer]  

## 2017-07-15 IMAGING — DX DG KNEE 1-2V PORT*L*
2 series · 2 of 2 positions shown · non-contrast
Comparison: None.

CLINICAL DATA: Status post left knee replacement

EXAM:
PORTABLE LEFT KNEE - 1-2 VIEW

[knee ap]
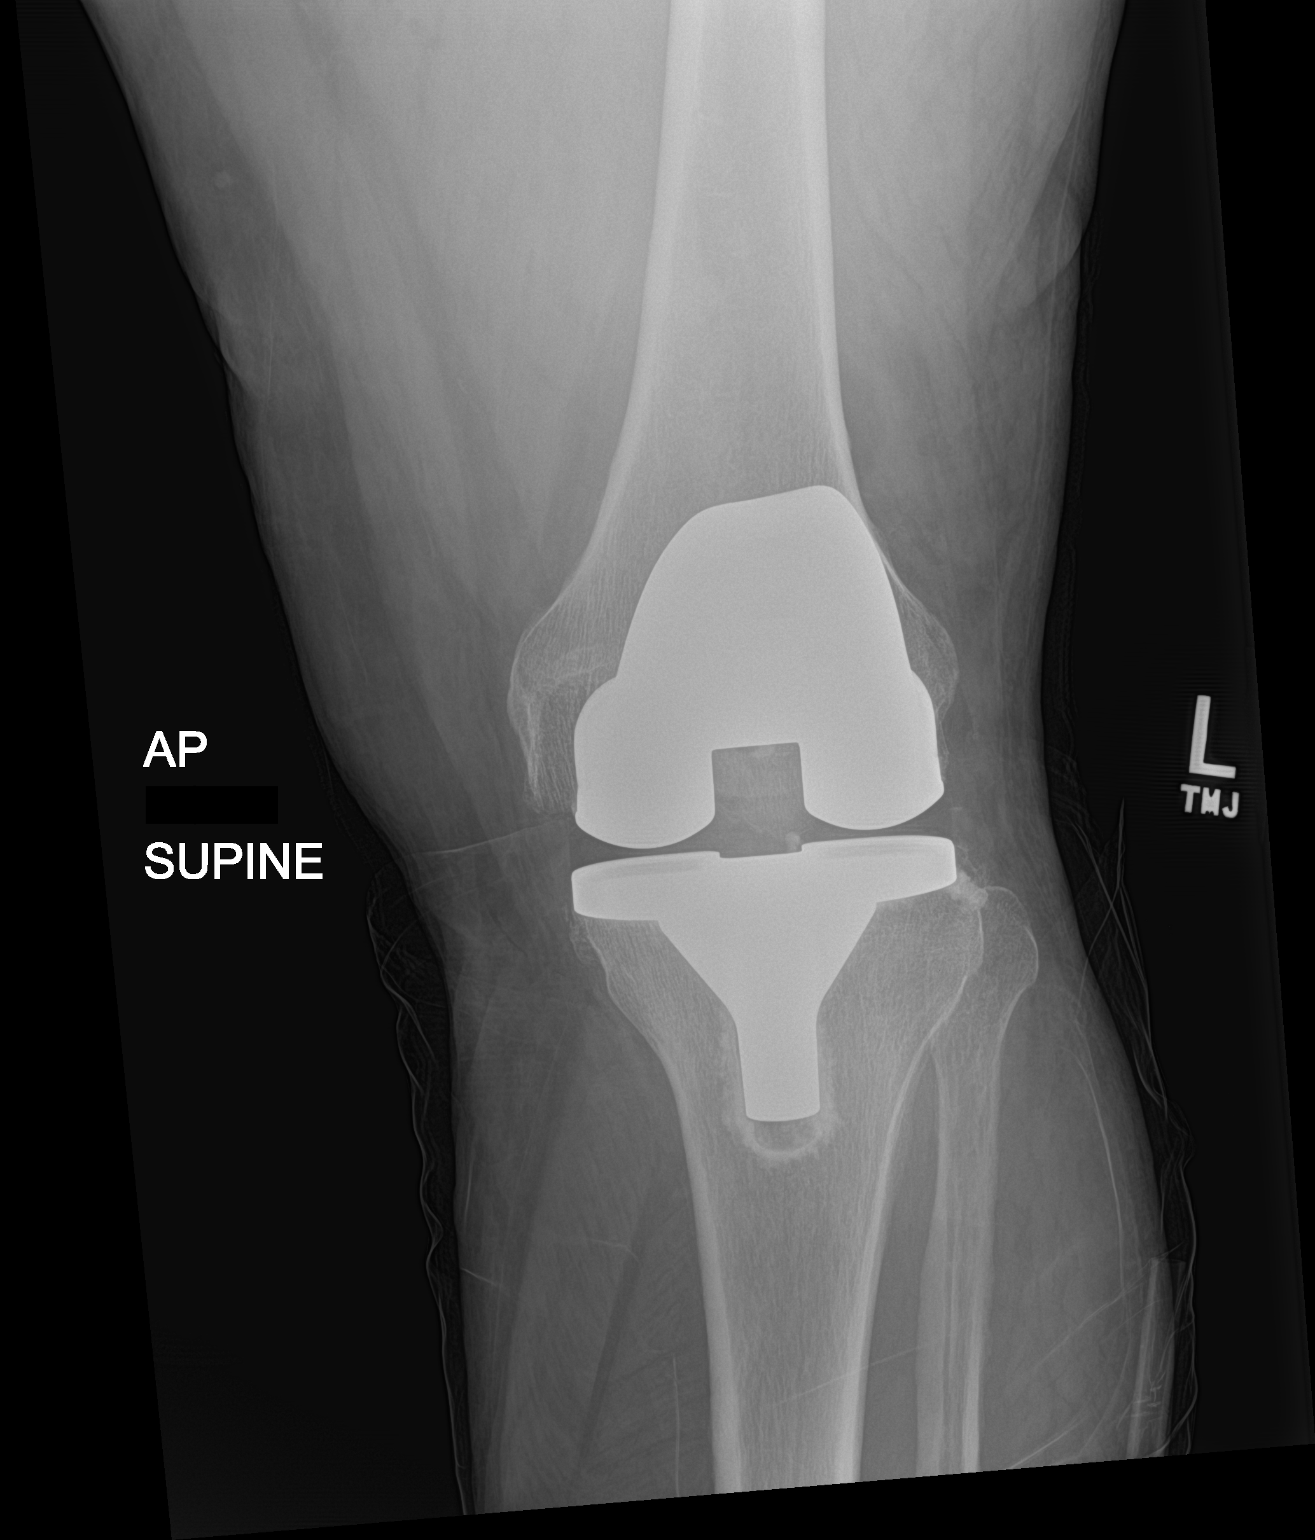

[knee lat]
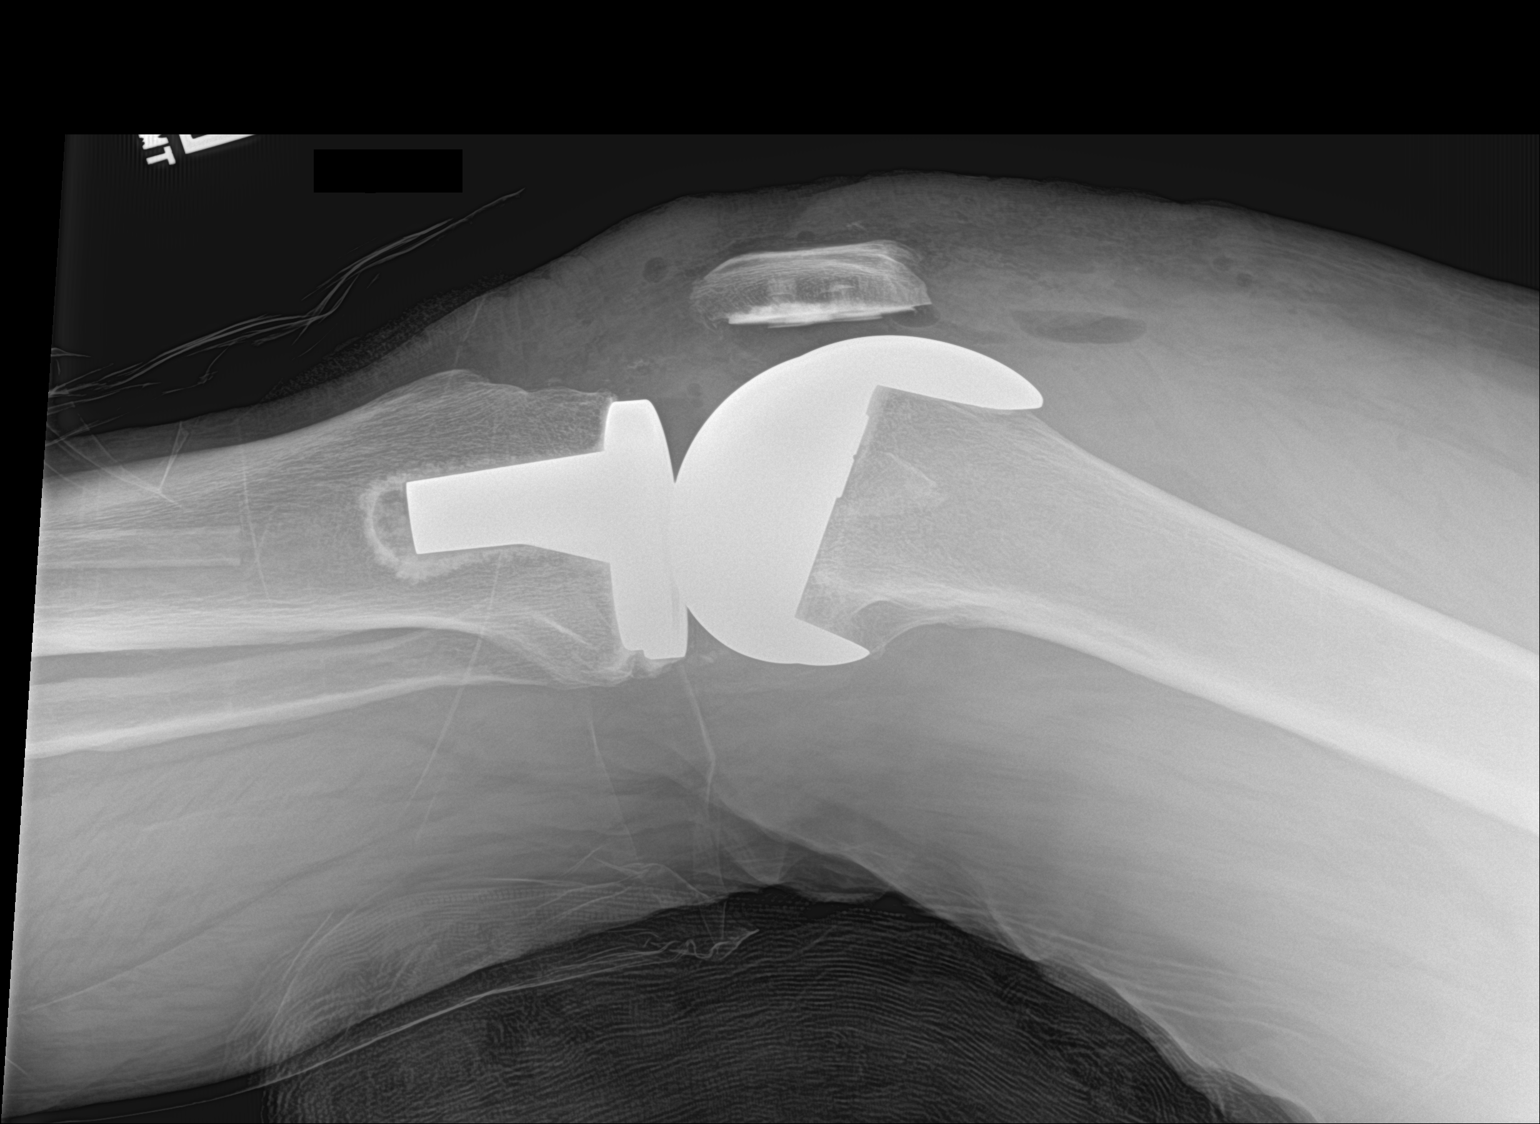

[2 of 2 positions shown; findings below may reference images not displayed]

FINDINGS: Left knee replacement is noted. No acute fracture or loosening is
seen. Air remains in the surgical bed. No other soft tissue
abnormality is seen.
IMPRESSION: Status post left knee replacement

## 2017-07-17 ENCOUNTER — Other Ambulatory Visit: Payer: Self-pay | Admitting: Physical Medicine and Rehabilitation

## 2017-07-17 DIAGNOSIS — G8929 Other chronic pain: Secondary | ICD-10-CM

## 2017-07-17 DIAGNOSIS — M545 Low back pain: Principal | ICD-10-CM

## 2017-07-18 ENCOUNTER — Ambulatory Visit
Admission: RE | Admit: 2017-07-18 | Discharge: 2017-07-18 | Disposition: A | Discharge status: Home / Self Care | Payer: Managed Care, Other (non HMO) | Source: Ambulatory Visit | Attending: Physical Medicine and Rehabilitation | Admitting: Physical Medicine and Rehabilitation

## 2017-07-18 DIAGNOSIS — M545 Low back pain: Principal | ICD-10-CM

## 2017-07-18 DIAGNOSIS — G8929 Other chronic pain: Secondary | ICD-10-CM

## 2017-08-01 ENCOUNTER — Telehealth: Payer: Self-pay | Admitting: Cardiovascular Disease

## 2017-08-01 NOTE — Telephone Encounter (Signed)
Patient having root canal on Tuesday and wants to make sure it is ok to take Eliquis.

## 2017-08-01 NOTE — Telephone Encounter (Signed)
I called and spoke with the patient. I inquired if the dentist had recommended that he come off eliquis. Per the patient, the dentist said he didn't need to come off Eliquis.  I advised him if the dentist did not recommend this, then he should continue his eliquis.  He voices understanding.

## 2017-08-03 ENCOUNTER — Other Ambulatory Visit: Payer: Self-pay | Admitting: Cardiovascular Disease

## 2017-08-05 ENCOUNTER — Other Ambulatory Visit: Payer: Self-pay | Admitting: Cardiovascular Disease

## 2017-08-05 ENCOUNTER — Other Ambulatory Visit: Payer: Self-pay | Admitting: Internal Medicine

## 2017-08-05 DIAGNOSIS — I480 Paroxysmal atrial fibrillation: Secondary | ICD-10-CM

## 2017-08-05 DIAGNOSIS — I82492 Acute embolism and thrombosis of other specified deep vein of left lower extremity: Secondary | ICD-10-CM

## 2017-08-05 DIAGNOSIS — IMO0001 Reserved for inherently not codable concepts without codable children: Secondary | ICD-10-CM

## 2017-08-05 DIAGNOSIS — Z0389 Encounter for observation for other suspected diseases and conditions ruled out: Secondary | ICD-10-CM

## 2017-08-05 DIAGNOSIS — I2699 Other pulmonary embolism without acute cor pulmonale: Secondary | ICD-10-CM

## 2017-08-05 DIAGNOSIS — R0789 Other chest pain: Secondary | ICD-10-CM

## 2017-08-08 ENCOUNTER — Ambulatory Visit: Payer: Self-pay | Admitting: Internal Medicine

## 2017-08-08 NOTE — Telephone Encounter (Signed)
Pt was pushing a car and while he was doing it he heard a popping sound now he is having right calf pain and moderate swelling and ankle pain. Pt is taking Ultram for it. He stated that his calf feels tight- Pt denies chest pain or difficulty breathing. The swelling does not look red or infected. Pt states calf is painful to touch.  Care advice given and instructed to rest the leg, ice the are affected, wrap area affected with an ACE wrap and elevate leg above the level of the heart. Pt has a h/o a fib and HTN and is on blood thinners. Care advice given and pt verbalized understanding Advised pt to call if site worsens or if he develops chest pain or the edema worsens. No availability at [facility]. Offered appt with another Conservation officer, historic buildings and pt refused only wanting to see Webb Silversmith FNP. Pt reminded that he may need further work up before the weekend. Routing back to [facility] for review.   Reason for Disposition . [1] MODERATE leg swelling (e.g., swelling extends up to knees) AND [2] new onset or worsening  Answer Assessment - Initial Assessment Questions 1. ONSET: "When did the swelling start?" (e.g., minutes, hours, days)     Wednesday whole calf got tight like he was flexing it 2. LOCATION: "What part of the leg is swollen?"  "Are both legs swollen or just one leg?"     Right behind knee where the calf muscle begins, hurts when trying to bend ankle 3. SEVERITY: "How bad is the swelling?" (e.g., localized; mild, moderate, severe)  - Localized - small area of swelling localized to one leg  - MILD pedal edema - swelling limited to foot and ankle, pitting edema < 1/4 inch (6 mm) deep, rest and elevation eliminate most or all swelling  - MODERATE edema - swelling of lower leg to knee, pitting edema > 1/4 inch (6 mm) deep, rest and elevation only partially reduce swelling  - SEVERE edema - swelling extends above knee, facial or hand swelling present      Localized moderate 4. REDNESS: "Does  the swelling look red or infected?"     no 5. PAIN: "Is the swelling painful to touch?" If so, ask: "How painful is it?"   (Scale 1-10; mild, moderate or severe)     Yes -moderate 6. FEVER: "Do you have a fever?" If so, ask: "What is it, how was it measured, and when did it start?"      no 7. CAUSE: "What do you think is causing the leg swelling?"   Pulled muscle pushing a car  8. MEDICAL HISTORY: "Do you have a history of heart failure, kidney disease, liver failure, or cancer?"   A fib HTN and on blood thinners 9. RECURRENT SYMPTOM: "Have you had leg swelling before?" If so, ask: "When was the last time?" "What happened that time?" no 10. OTHER SYMPTOMS: "Do you have any other symptoms?" (e.g., chest pain, difficulty breathing)       no 11. PREGNANCY: "Is there any chance you are pregnant?" "When was your last menstrual period?"       n/a  Protocols used: LEG SWELLING AND EDEMA-A-AH

## 2017-08-08 NOTE — Telephone Encounter (Signed)
Spoke to pt who is only wanting to see RBaity. Offered and scheduled appt for 7/22. Pt states he has leg elevated and will ice as instructed and continue taking Tramadol. Pt states he will go directly to UC should he worsen before his appt

## 2017-08-11 ENCOUNTER — Encounter: Payer: Self-pay | Admitting: Internal Medicine

## 2017-08-11 ENCOUNTER — Ambulatory Visit (INDEPENDENT_AMBULATORY_CARE_PROVIDER_SITE_OTHER): Payer: Managed Care, Other (non HMO) | Admitting: Internal Medicine

## 2017-08-11 VITALS — BP 124/80 | HR 70 | Temp 97.8°F | Resp 16 | Ht 69.0 in | Wt 295.4 lb

## 2017-08-11 DIAGNOSIS — S86111A Strain of other muscle(s) and tendon(s) of posterior muscle group at lower leg level, right leg, initial encounter: Secondary | ICD-10-CM

## 2017-08-11 MED ORDER — HYDROCODONE-ACETAMINOPHEN 5-325 MG PO TABS: 1.0000 | ORAL_TABLET | Freq: Three times a day (TID) | ORAL | 0 refills | Status: DC

## 2017-08-11 NOTE — Progress Notes (Signed)
Subjective:    Patient ID: Richard [name], male    DOB: 1960-08-15, 57 y.o.   MRN: [id number]  HPI  Pt presents to the clinic today with c/o right ankle and calf pain and swelling. He reports late last week, he was pushing a car. While he was doing that, he heard a pop and felt pain in his calf. He describes the pain as throbing. The pain does not radiate. He reports swelling and tenderness with palpation but no redness or warmth. He has tried ice, elevation, ACE wrap and Tramadol. He has been walking with a crutch for assistance.  Review of Systems      Past Medical History:  Diagnosis Date  . Arthritis   . Arthrofibrosis of total knee arthroplasty ([place]) 05/30/2015  . Bilateral pulmonary embolism ([place])    a. 03/2015 CTA: acute bilat PE; b. IVC filter placed in 2017--> removed 2018.  Marland Kitchen Chest pain    a. 2015 Abnl MV;  b. 2015 Cath: nl cors.  . Chronic diastolic CHF (congestive heart failure) ([place])    a. 03/2015 Echo: EF 55-65%, poor windows, mildly dil RV.  Marland Kitchen DVT (deep venous thrombosis) ([place])    a. 03/2015 U/S: occlusive DVT w/in the distal aspect of the Left fem vein through the L popliteal vein.  Marland Kitchen GERD (gastroesophageal reflux disease)   . Headache   . Hypertensive heart disease   . Obesity   . OSA (obstructive sleep apnea)    uses CPAP nightly  . PAF (paroxysmal atrial fibrillation) ([place])   . Primary localized osteoarthritis of left knee    a. 11/2014 s/p L TKA.  . Primary localized osteoarthritis of right knee    a. 02/2014 s/p R Partial Knee arthroplasty.  . Stiffness of left knee    a. 12/2014 s/p manipulation under anesthesia.  . Urine incontinence     Current Outpatient Medications  Medication Sig Dispense Refill  . acetaminophen (TYLENOL) 650 MG CR tablet Take 1,300 mg by mouth every 8 (eight) hours as needed for pain.    . baclofen (LIORESAL) 10 MG tablet TAKE 1-2 TABLETS (10-20 MG TOTAL) BY MOUTH 3 (THREE) TIMES DAILY AS NEEDED FOR MUSCLE SPASMS. 180 tablet 0  .  bismuth subsalicylate (PEPTO-BISMOL) 262 MG chewable tablet Chew 2 tablets (524 mg total) by mouth 4 (four) times daily -  before meals and at bedtime. (Patient taking differently: Chew 524 mg by mouth 3 (three) times daily as needed for indigestion. ) 112 tablet 0  . cyanocobalamin (,VITAMIN B-12,) 1000 MCG/ML injection Inject 1,000 mcg into the muscle every 30 (thirty) days.    Marland Kitchen docusate sodium (COLACE) 100 MG capsule Take 100-200 mg by mouth 2 (two) times daily as needed for mild constipation.    Marland Kitchen ELIQUIS 5 MG TABS tablet TAKE 1 TABLET (5 MG TOTAL) BY MOUTH 2 (TWO) TIMES DAILY. 180 tablet 3  . esomeprazole (NEXIUM) 40 MG capsule Take 40 mg by mouth daily as needed (for acid reflux).     . flecainide (TAMBOCOR) 100 MG tablet TAKE 1 TABLET BY MOUTH TWICE A DAY 60 tablet 3  . furosemide (LASIX) 40 MG tablet TAKE 1 TABLET BY MOUTH EVERY DAY 30 tablet 1  . gabapentin (NEURONTIN) 100 MG capsule Take 1 capsule (100 mg total) by mouth daily as needed. (Patient taking differently: Take 100 mg by mouth daily as needed (for hand pain). ) 30 capsule 2  . metoprolol succinate (TOPROL-XL) 25 MG 24 hr tablet TAKE 1  TABLET (25 MG TOTAL) BY MOUTH 2 (TWO) TIMES DAILY. TAKE WITH OR IMMEDIATELY FOLLOWING A MEAL. 180 tablet 3  . silver sulfADIAZINE (SILVADENE) 1 % cream Apply 1 application topically daily. 50 g 0  . traMADol (ULTRAM) 50 MG tablet Take 2 tablets (100 mg total) by mouth at bedtime as needed. 60 tablet 0   No current facility-administered medications for this visit.     Allergies  Allergen Reactions  . Penicillin G Other (See Comments)    From childhood; uncertain of reaction Has patient had a PCN reaction causing immediate rash, facial/tongue/throat swelling, SOB or lightheadedness with hypotension: Unk Has patient had a PCN reaction causing severe rash involving mucus membranes or skin necrosis: Unk Has patient had a PCN reaction that required hospitalization: Unk Has patient had a PCN reaction  occurring within the last 10 years: No If all of the above answers are "NO", then may proceed with Cephalosporin use.  Marland Kitchen Penicillins Anaphylaxis and Other (See Comments)    From childhood; uncertain of reaction Has patient had a PCN reaction causing immediate rash, facial/tongue/throat swelling, SOB or lightheadedness with hypotension: Unk Has patient had a PCN reaction causing severe rash involving mucus membranes or skin necrosis: Unk Has patient had a PCN reaction that required hospitalization: Unk Has patient had a PCN reaction occurring within the last 10 years: No If all of the above answers are "NO", then may proceed with Cephalosporin use.  . Vancomycin Other (See Comments)    Red Mans' syndrome    Family History  Problem Relation Age of Onset  . Coronary artery disease Mother   . Hyperlipidemia Mother   . Hypertension Mother   . Arthritis Mother   . Coronary artery disease Father   . Heart disease Paternal Grandmother   . Alzheimer's disease Paternal Grandfather     Social History   Socioeconomic History  . Marital status: Married    Spouse name: Not on file  . Number of children: Not on file  . Years of education: Not on file  . Highest education level: Not on file  Occupational History  . Occupation: Energy manager: [employer]  Social Needs  . Financial resource strain: Not on file  . Food insecurity:    Worry: Not on file    Inability: Not on file  . Transportation needs:    Medical: Not on file    Non-medical: Not on file  Tobacco Use  . Smoking status: Never Smoker  . Smokeless tobacco: Never Used  Substance and Sexual Activity  . Alcohol use: Yes    Comment: occassional  . Drug use: No  . Sexual activity: Yes  Lifestyle  . Physical activity:    Days per week: Not on file    Minutes per session: Not on file  . Stress: Not on file  Relationships  . Social connections:    Talks on phone: Not on file    Gets together: Not on file    Attends  religious service: Not on file    Active member of club or organization: Not on file    Attends meetings of clubs or organizations: Not on file    Relationship status: Not on file  . Intimate partner violence:    Fear of current or ex partner: Not on file    Emotionally abused: Not on file    Physically abused: Not on file    Forced sexual activity: Not on file  Other Topics Concern  .  Not on file  Social History Narrative  . Not on file     Constitutional: Denies fever, malaise, fatigue, headache or abrupt weight changes.  Musculoskeletal: Pt reports right ankle/calf pain and swelling. .  Skin: Denies redness, rashes, lesions or ulcercations.   No other specific complaints in a complete review of systems (except as listed in HPI above).  Objective:   Physical Exam   BP 124/80 (BP Location: Left Arm, Patient Position: Sitting, Cuff Size: Large)   Pulse 70   Temp 97.8 F (36.6 C) (Oral)   Resp 16   Ht 5\' 9"  (1.753 m)   Wt 295 lb 6.4 oz (134 kg)   SpO2 97%   BMI 43.62 kg/m  Wt Readings from Last 3 Encounters:  08/11/17 295 lb 6.4 oz (134 kg)  07/14/17 298 lb (135.2 kg)  03/25/17 (!) 307 lb 4 oz (139.4 kg)    General: Appears his stated age, obese in NAD. Skin: Warm, dry and intact. No redness or warmth noted. Musculoskeletal: Normal extension of the right knee. Pain with flexion. Decreased flexion, extension and rotation of the right ankle. No joint swelling noted. Hard lump noted of right calf, consistent with gastrocnemius tear. Using crutch for assistance with gait.  Neurological: Alert and oriented. Sensation intact to BLE.   BMET    Component Value Date/Time   NA 142 02/25/2017 1557   K 4.1 02/25/2017 1557   CL 107 02/25/2017 1557   CO2 23 12/29/2016 0059   GLUCOSE 94 02/25/2017 1557   BUN 17 02/25/2017 1557   CREATININE 1.40 (H) 02/25/2017 1557   CREATININE 1.27 12/06/2015 1600   CALCIUM 8.7 (L) 12/29/2016 0059   GFRNONAA 54 (L) 12/29/2016 0059   GFRAA  >60 12/29/2016 0059    Lipid Panel     Component Value Date/Time   CHOL 124 03/29/2015 0434   TRIG 71 03/29/2015 0434   HDL 30 (L) 03/29/2015 0434   CHOLHDL 4.1 03/29/2015 0434   VLDL 14 03/29/2015 0434   LDLCALC 80 03/29/2015 0434    CBC    Component Value Date/Time   WBC 10.8 (H) 12/29/2016 0059   RBC 5.36 12/29/2016 0059   HGB 13.6 02/25/2017 1557   HCT 40.0 02/25/2017 1557   PLT 224 12/29/2016 0059   MCV 71.2 (L) 12/29/2016 0059   MCH 22.1 (L) 12/29/2016 0059   MCHC 31.1 (L) 12/29/2016 0059   RDW 18.3 (H) 12/29/2016 0059   LYMPHSABS 1.8 07/19/2016 1333   MONOABS 0.6 07/19/2016 1333   EOSABS 0.1 07/19/2016 1333   BASOSABS 0.0 07/19/2016 1333    Hgb A1C Lab Results  Component Value Date   HGBA1C 6.0 11/05/2016          Assessment & Plan:   Gastrocnemius Tear, Right:  Encouraged compression with ACE wrap, he declines compression hose Encouraged ice and elevation RX for Norco 5-325 mg every 8 hours for pain Continue crutch for assistance with gait Follow up with ortho  Return precautions discussed Webb Silversmith, NP

## 2017-08-11 NOTE — Patient Instructions (Signed)
Medial Head Gastrocnemius Tear Medial head gastrocnemius tear, also called tennis leg, is an injury to the inner part of the calf muscle. This injury may include overstretching of the muscle or a partial or complete tear. This is a common sports injury. Calf muscle tears usually occur near the back of the knee. This often causes sudden pain and muscle weakness. What are the causes? This condition is caused by forceful stretching or strain on the calf muscle. This usually happens when you forcefully push off of your foot. It may also happen if you forcefully straighten your knee while your foot is flat on the ground. What increases the risk? The following factors may make you more likely to develop this condition:  Being male and older than age 25.  Playing sports that involve: ? Quick increases in speed and changes of direction, such as tennis and soccer. ? Jumping, such as basketball. ? Running, especially uphill or on uneven ground.  What are the signs or symptoms? Symptoms of this condition include:  Sudden pain in the back of the leg when the injury occurs. You may hear a popping sound or feel like you got hit in your calf.  Pain that is made worse by bringing your toes up toward your shin or by straightening your knee.  Pain on the inside of your calf, from your knee to your ankle.  Not being able to rise up on your toes.  Pain when pressing on your calf muscle.  Swelling of your calf.  Bruising along your calf and lower leg, down to your ankle.  Difficulty pushing off your foot when walking or using stairs.  How is this diagnosed? This condition may be diagnosed based on:  Your symptoms and medical history.  A physical exam. Your health care provider may be able to feel a lump or a defect in your muscle.  MRI or ultrasound to determine the severity and exact location of your injury.  How is this treated? Treatment for this condition may include:  Resting the muscle  and keeping weight off your leg for several days. During this time, you may use crutches or another walking device.  Using a splint to keep your ankle or knee in a stable position.  Wearing a walking boot to decrease the use of your gastrocnemius muscle.  Using a wedge under your heel to reduce stretching of your healing muscle.  Wearing a compression sleeve around your calf muscle.  Icing the muscle.  Raising (elevating) your leg when resting.  Taking medicine for pain and swelling (NSAIDs or steroids).  Doing leg exercises as told by your health care provider or physical therapist.  Follow these instructions at home: If you have a splint or compression sleeve:  Wear it as told by your health care provider. Remove it only as told by your health care provider.  Loosen the splint if your toes tingle, become numb, or turn cold and blue.  If your splint or sleeve is not waterproof: ? Do not let it get wet. ? Protect it with a watertight covering when you take a bath or a shower.  Keep the splint or sleeve clean. Managing pain, stiffness, and swelling  If directed, put ice on the injured area: ? Put ice in a plastic bag. ? Place a towel between your skin and the bag. ? Leave the ice on for 20 minutes, 2-3 times a day.  Move your toes often to avoid stiffness and to lessen swelling.  Raise (elevate) the injured area above the level of your heart while you are sitting or lying down. Driving  Do not drive or operate heavy machinery while taking prescription pain medicine.  Ask your health care provider when it is safe to drive. Activity  Do not use the injured limb to support your body weight until your health care provider says that you can. Use crutches as told by your health care provider.  Return gradually to your normal activities as told by your health care provider. Ask your health care provider what activities are safe for you.  Do exercises as told by your health  care provider.  Return to sporting activity only as told by your health care provider or physical therapist. Full recovery may take several months. General instructions  Take over-the-counter and prescription medicines only as told by your health care provider.  Keep all follow-up visits as told by your health care provider. This is important. How is this prevented?  Warm up and stretch before being active.  Cool down and stretch after being active.  Give your body time to rest between periods of activity.  Make sure to use equipment that fits you.  Be safe and responsible while being active to avoid falls.  Maintain physical fitness, including: ? Strength. ? Flexibility. Contact a health care provider if:  Your symptoms do not improve with rest and treatment. Get help right away if:  You have swelling or redness in your calf that is getting worse. This information is not intended to replace advice given to you by your health care provider. Make sure you discuss any questions you have with your health care provider. Document Released: 01/07/2005 Document Revised: 09/12/2015 Document Reviewed: 12/20/2014 Elsevier Interactive Patient Education  [employer].

## 2017-08-21 ENCOUNTER — Encounter

## 2017-08-21 ENCOUNTER — Ambulatory Visit: Payer: Managed Care, Other (non HMO) | Admitting: Internal Medicine

## 2017-08-21 DIAGNOSIS — Z0289 Encounter for other administrative examinations: Secondary | ICD-10-CM

## 2017-09-02 ENCOUNTER — Other Ambulatory Visit: Payer: Self-pay | Admitting: Internal Medicine

## 2017-09-17 ENCOUNTER — Other Ambulatory Visit: Payer: Self-pay | Admitting: Internal Medicine

## 2017-09-17 NOTE — Telephone Encounter (Signed)
Tramadol refill Last Refill:07/14/17 #60 Last OV: 08/11/17 PCP: Jacquelynn Cree Pharmacy:[facility]

## 2017-09-17 NOTE — Telephone Encounter (Signed)
Copied from [place] [phone]. Topic: Quick Communication - See Telephone Encounter >> Sep 17, 2017  4:51 PM Ivar Drape wrote: CRM for notification. See Telephone encounter for: 09/17/17. Patient would like a refill on his Tramadol medication and have it sent to his preferred pharmacy [facility], [address], [place], [place] [address].  Please make sure all future prescriptions go to [facility].

## 2017-09-18 MED ORDER — TRAMADOL HCL 50 MG PO TABS: 100.0000 mg | ORAL_TABLET | Freq: Every evening | ORAL | 0 refills | Status: DC | PRN

## 2017-09-18 NOTE — Telephone Encounter (Signed)
Name of Medication: Tramadol 50 mg Name of [place] or Written Date and Quantity: # 60 on 07/14/17 Last Office Visit and Type: 08/11/17 for leg pain Next Office Visit and Type: none Last Controlled Substance Agreement Date: none Last [id number]

## 2017-09-19 ENCOUNTER — Telehealth: Payer: Self-pay

## 2017-09-19 DIAGNOSIS — Z7901 Long term (current) use of anticoagulants: Secondary | ICD-10-CM

## 2017-09-19 NOTE — Telephone Encounter (Signed)
Please review for refill on Eliquis 5 mg  

## 2017-09-24 MED ORDER — APIXABAN 5 MG PO TABS: ORAL_TABLET | ORAL | 0 refills | Status: DC

## 2017-09-24 NOTE — Telephone Encounter (Signed)
Pt needs BMET to assess kidney fx prior to further refills.

## 2017-09-26 ENCOUNTER — Other Ambulatory Visit: Payer: Self-pay | Admitting: Orthopedic Surgery

## 2017-09-26 DIAGNOSIS — M25512 Pain in left shoulder: Secondary | ICD-10-CM

## 2017-10-01 ENCOUNTER — Other Ambulatory Visit: Payer: Self-pay | Admitting: Internal Medicine

## 2017-10-09 ENCOUNTER — Ambulatory Visit
Admission: RE | Admit: 2017-10-09 | Discharge: 2017-10-09 | Disposition: A | Discharge status: Home / Self Care | Payer: Managed Care, Other (non HMO) | Source: Ambulatory Visit | Attending: Orthopedic Surgery | Admitting: Orthopedic Surgery

## 2017-10-09 DIAGNOSIS — M25512 Pain in left shoulder: Secondary | ICD-10-CM | POA: Insufficient documentation

## 2017-10-09 DIAGNOSIS — M75112 Incomplete rotator cuff tear or rupture of left shoulder, not specified as traumatic: Secondary | ICD-10-CM | POA: Diagnosis not present

## 2017-10-09 DIAGNOSIS — M19012 Primary osteoarthritis, left shoulder: Secondary | ICD-10-CM | POA: Insufficient documentation

## 2017-10-30 ENCOUNTER — Telehealth: Payer: Self-pay | Admitting: Cardiovascular Disease

## 2017-10-30 NOTE — Telephone Encounter (Signed)
Patient has appointment with Dr Fletcher Anon tomorrow, 10/31/17. Routing to Dr Fletcher Anon.

## 2017-10-30 NOTE — Telephone Encounter (Signed)
°  ° °  [facility] Pre-operative Risk Assessment    Request for surgical clearance:  1. What type of surgery is being performed? Rotator cuff   2. When is this surgery scheduled? 10/29  3. What type of clearance is required (medical clearance vs. Pharmacy clearance to hold med vs. Both)?   4. Are there any medications that need to be held prior to surgery and how long? Needs to know when to stop Eliquis and for how long  5. Practice name and name of physician performing surgery? Dr. Tamera Punt at [facility]    6. What is your office phone number [phone]   7.   What is your office fax number   8.   Anesthesia type (None, local, MAC, general) ?    Caryl Pina Gerringer 10/30/2017, 10:48 AM  _________________________________________________________________   (provider comments below)  Patient came by office States the office should be reaching out as well, just wanted to start clearance process

## 2017-10-31 ENCOUNTER — Ambulatory Visit (INDEPENDENT_AMBULATORY_CARE_PROVIDER_SITE_OTHER): Payer: Managed Care, Other (non HMO) | Admitting: Cardiovascular Disease

## 2017-10-31 ENCOUNTER — Encounter: Payer: Self-pay | Admitting: Cardiovascular Disease

## 2017-10-31 VITALS — BP 128/60 | HR 55 | Ht 69.0 in | Wt 305.2 lb

## 2017-10-31 DIAGNOSIS — I480 Paroxysmal atrial fibrillation: Secondary | ICD-10-CM

## 2017-10-31 DIAGNOSIS — Z01810 Encounter for preprocedural cardiovascular examination: Secondary | ICD-10-CM | POA: Diagnosis not present

## 2017-10-31 DIAGNOSIS — E785 Hyperlipidemia, unspecified: Secondary | ICD-10-CM | POA: Diagnosis not present

## 2017-10-31 NOTE — Progress Notes (Signed)
Cardiology Office Note   Date:  10/31/2017   ID:  Richard [name], DOB [birth year], MRN [id number]  PCP:  Jearld Fenton, NP  Cardiologist:   Kathlyn Sacramento, MD   Chief Complaint  Patient presents with  . other    4 month follow up. Meds reviewed by the pt. verbally. Pt. needs a cardiac clearance to come off Eliquis prior to left rotator cuff repair and also needs to have liver check before he can get another refill on Eliquis per the pharmacy.       History of Present Illness: Richard [name] is a 57 y.o. male who presents for a follow-up visit regarding paroxysmal atrial fibrillation and preop cardiovascular evaluation for shoulder surgery.. The patient has history of normal cardiac catheterization in 2015 after an abnormal stress test, hypertension, obesity, osteoarthritis status post bilateral knee surgeries.   The patient had multiple knee surgeries starting in 2016. He developed left lower extremity DVT complicated by massive pulmonary embolism which was treated with catheter-based intervention and IVC filter placement.  He had atrial fibrillation at that time but converted to sinus rhythm. He had intermittent palpitations thought to be due to atrial fibrillation and has been maintaining in sinus rhythm with flecainide. Lifelong anticoagulation has been recommended.  He has been doing well with no recent chest pain, shortness of breath or palpitations.  A. fib seems to be controlled with flecainide.  He gained some weight and started being more active.  He wants to lose weight.  Past Medical History:  Diagnosis Date  . Arthritis   . Arthrofibrosis of total knee arthroplasty ([place]) 05/30/2015  . Bilateral pulmonary embolism ([place])    a. 03/2015 CTA: acute bilat PE; b. IVC filter placed in 2017--> removed 2018.  Marland Kitchen Chest pain    a. 2015 Abnl MV;  b. 2015 Cath: nl cors.  . Chronic diastolic CHF (congestive heart failure) ([place])    a. 03/2015 Echo: EF 55-65%, poor windows, mildly  dil RV.  Marland Kitchen DVT (deep venous thrombosis) ([place])    a. 03/2015 U/S: occlusive DVT w/in the distal aspect of the Left fem vein through the L popliteal vein.  Marland Kitchen GERD (gastroesophageal reflux disease)   . Headache   . Hypertensive heart disease   . Obesity   . OSA (obstructive sleep apnea)    uses CPAP nightly  . PAF (paroxysmal atrial fibrillation) ([place])   . Primary localized osteoarthritis of left knee    a. 11/2014 s/p L TKA.  . Primary localized osteoarthritis of right knee    a. 02/2014 s/p R Partial Knee arthroplasty.  . Stiffness of left knee    a. 12/2014 s/p manipulation under anesthesia.  . Urine incontinence     Past Surgical History:  Procedure Laterality Date  . APPENDECTOMY    . CARDIAC CATHETERIZATION    . EXAM UNDER ANESTHESIA WITH MANIPULATION OF KNEE Left 05/30/2015   Procedure: EXAM UNDER ANESTHESIA WITH MANIPULATION OF LEFT KNEE;  Surgeon: Marchia Bond, MD;  Location: [facility];  Service: Orthopedics;  Laterality: Left;  . I&D KNEE WITH POLY EXCHANGE Left 10/30/2015   Procedure: IRRIGATION AND DEBRIDEMENT KNEE WITH POLY EXCHANGE PLACE SPACERS;  Surgeon: Frederik Pear, MD;  Location: [facility];  Service: Orthopedics;  Laterality: Left;  OFF ELIQUIST 3 DAYS  . IVC FILTER PLACEMENT ([facility] HX)  04/13/15  . IVC FILTER REMOVAL N/A 08/13/2016   Procedure: IVC Filter Removal;  Surgeon: Katha Cabal, MD;  Location: [facility];  Service: Cardiovascular;  Laterality: N/A;  . JOINT REPLACEMENT    . KNEE ARTHROSCOPY Left 08/08/2015   Procedure: LEFT KNEE MANIPULATION WITH ARTHROSCOPIC LYSIS OF ADHESIONS AND ASPIRATION;  Surgeon: Marchia Bond, MD;  Location: [facility];  Service: Orthopedics;  Laterality: Left;  . KNEE CLOSED REDUCTION Left 01/20/2015   Procedure: LEFT KNEE MANIPULATION;  Surgeon: Marchia Bond, MD;  Location: [facility];  Service: Orthopedics;  Laterality: Left;  . LEFT HEART CATHETERIZATION WITH CORONARY ANGIOGRAM N/A 01/12/2014   Procedure: LEFT HEART  CATHETERIZATION WITH CORONARY ANGIOGRAM;  Surgeon: Jettie Booze, MD;  Location: [facility] CATH LAB;  Service: Cardiovascular;  Laterality: N/A;  . ORTHOPEDIC SURGERY Left    arthroscopy x3  . PARTIAL KNEE ARTHROPLASTY Right 02/25/2014   Procedure: RIGHT KNEE ARTHROPLASTY CONDYLE AND PLATEAU MEDIAL COMPARTMENT ;  Surgeon: Johnny Bridge, MD;  Location: [facility];  Service: Orthopedics;  Laterality: Right;  . PERIPHERAL VASCULAR CATHETERIZATION N/A 03/29/2015   Procedure: IVC Filter Insertion, and possible thrombectomy;  Surgeon: Katha Cabal, MD;  Location: [facility];  Service: Cardiovascular;  Laterality: N/A;  . TOTAL KNEE ARTHROPLASTY  12/06/2014   Procedure: TOTAL KNEE ARTHROPLASTY;  Surgeon: Marchia Bond, MD;  Location: [facility];  Service: Orthopedics;;  . TOTAL KNEE REVISION Left 02/08/2016   Procedure: TOTAL KNEE REVISION;  Surgeon: Frederik Pear, MD;  Location: [facility];  Service: Orthopedics;  Laterality: Left;     Current Outpatient Medications  Medication Sig Dispense Refill  . acetaminophen (TYLENOL) 650 MG CR tablet Take 1,300 mg by mouth every 8 (eight) hours as needed for pain.    Marland Kitchen apixaban (ELIQUIS) 5 MG TABS tablet TAKE 1 TABLET (5 MG TOTAL) BY MOUTH 2 (TWO) TIMES DAILY. Pt needs labs to assess kidney function prior to further refills. 60 tablet 0  . baclofen (LIORESAL) 10 MG tablet TAKE 1-2 TABLETS (10-20 MG TOTAL) BY MOUTH 3 (THREE) TIMES DAILY AS NEEDED FOR MUSCLE SPASMS. 180 tablet 0  . bismuth subsalicylate (PEPTO-BISMOL) 262 MG chewable tablet Chew 2 tablets (524 mg total) by mouth 4 (four) times daily -  before meals and at bedtime. (Patient taking differently: Chew 524 mg by mouth 3 (three) times daily as needed for indigestion. ) 112 tablet 0  . cyanocobalamin (,VITAMIN B-12,) 1000 MCG/ML injection Inject 1,000 mcg into the muscle every 30 (thirty) days.    Marland Kitchen docusate sodium (COLACE) 100 MG capsule Take 100-200 mg by mouth 2 (two) times daily as  needed for mild constipation.    Marland Kitchen esomeprazole (NEXIUM) 40 MG capsule Take 40 mg by mouth daily as needed (for acid reflux).     . flecainide (TAMBOCOR) 100 MG tablet TAKE 1 TABLET BY MOUTH TWICE A DAY 60 tablet 3  . furosemide (LASIX) 40 MG tablet Take 1 tablet (40 mg total) by mouth daily. MUST SCHEDULE ANNUAL EXAM 30 tablet 1  . gabapentin (NEURONTIN) 100 MG capsule Take 1 capsule (100 mg total) by mouth daily as needed. (Patient taking differently: Take 100 mg by mouth daily as needed (for hand pain). ) 30 capsule 2  . HYDROcodone-acetaminophen (NORCO/VICODIN) 5-325 MG tablet Take 1 tablet by mouth every 8 (eight) hours. 10 tablet 0  . metoprolol succinate (TOPROL-XL) 25 MG 24 hr tablet TAKE 1 TABLET (25 MG TOTAL) BY MOUTH 2 (TWO) TIMES DAILY. TAKE WITH OR IMMEDIATELY FOLLOWING A MEAL. 180 tablet 3  . silver sulfADIAZINE (SILVADENE) 1 % cream Apply 1 application topically daily. [place]  g 0  . traMADol (ULTRAM) 50 MG tablet Take 2 tablets (100 mg total) by mouth at bedtime as needed. 60 tablet 0   No current facility-administered medications for this visit.     Allergies:   Penicillin g; Penicillins; and Vancomycin    Social History:  The patient  reports that he has never smoked. He has never used smokeless tobacco. He reports that he drinks alcohol. He reports that he does not use drugs.   Family History:  The patient's family history includes Alzheimer's disease in his paternal grandfather; Arthritis in his mother; Coronary artery disease in his father and mother; Heart disease in his paternal grandmother; Hyperlipidemia in his mother; Hypertension in his mother.    ROS:  Please see the history of present illness.   Otherwise, review of systems are positive for none.   All other systems are reviewed and negative.    PHYSICAL EXAM: VS:  BP 128/60 (BP Location: Right Arm, Patient Position: Sitting, Cuff Size: Normal)   Pulse (!) 55   Ht 5\' 9"  (1.753 m)   Wt (!) 305 lb 4 oz (138.5 kg)    BMI 45.08 kg/m  , BMI Body mass index is 45.08 kg/m. GEN: Well nourished, well developed, in no acute distress  HEENT: normal  Neck: no JVD, carotid bruits, or masses Cardiac: RRR; no murmurs, rubs, or gallops,no edema  Respiratory:  clear to auscultation bilaterally, normal work of breathing GI: soft, nontender, nondistended, + BS MS: no deformity or atrophy  Skin: warm and dry, no rash Neuro:  Strength and sensation are intact Psych: euthymic mood, full affect   EKG:  EKG is ordered today. The ekg ordered today demonstrates Sinus bradycardia with left axis deviation.  Recent Labs: 11/05/2016: TSH 1.65 12/29/2016: ALT 37; Magnesium 1.7; Platelets 224 02/25/2017: BUN 17; Creatinine, Ser 1.40; Hemoglobin 13.6; Potassium 4.1; Sodium 142    Lipid Panel    Component Value Date/Time   CHOL 124 03/29/2015 0434   TRIG 71 03/29/2015 0434   HDL 30 (L) 03/29/2015 0434   CHOLHDL 4.1 03/29/2015 0434   VLDL 14 03/29/2015 0434   LDLCALC 80 03/29/2015 0434      Wt Readings from Last 3 Encounters:  10/31/17 (!) 305 lb 4 oz (138.5 kg)  08/11/17 295 lb 6.4 oz (134 kg)  07/14/17 298 lb (135.2 kg)       ASSESSMENT AND PLAN:  1.  Paroxysmal atrial fibrillation:  No evidence of recurrent arrhythmia on flecainide. He is tolerating anticoagulation with Eliquis.  I requested routine labs including CBC and basic metabolic profile .  I also requested lipid and liver profile  2. Pulmonary embolism: Given the burden of his pulmonary embolism, recommend continuing anticoagulation.   3. Obesity: I discussed with him the importance of healthy diet, exercise and weight loss.   4.  Preop cardiovascular evaluation for shoulder surgery: The patient has no anginal symptoms and no history of ischemic heart disease.  He is at low risk from a cardiac standpoint.  Eliquis can be held 2 days before surgery and should be resumed after.   Disposition:   FU with me in 6 months  Signed,  Kathlyn Sacramento, MD   10/31/2017 2:15 PM    [facility]

## 2017-10-31 NOTE — Patient Instructions (Signed)
Medication Instructions:  No changes at this time. If you need a refill on your cardiac medications before your next appointment, please call your pharmacy.   Lab work: [employer] today were CBC, Bmet, liver, and lipid If you have labs (blood work) drawn today and your tests are completely normal, you will receive your results only by: Marland Kitchen MyChart Message (if you have MyChart) OR . A paper copy in the mail If you have any lab test that is abnormal or we need to change your treatment, we will call you to review the results.  Testing/Procedures: None at this time.  Follow-Up: At [facility], you and your health needs are our priority.  As part of our continuing mission to provide you with exceptional heart care, we have created designated Provider Care Teams.  These Care Teams include your primary Cardiologist (physician) and Advanced Practice Providers (APPs -  Physician Assistants and Nurse Practitioners) who all work together to provide you with the care you need, when you need it. You will need a follow up appointment in 6 months.  Please call our office 2 months in advance to schedule this appointment.  You may see Kathlyn Sacramento, MD or one of the following Advanced Practice Providers on your designated Care Team:   Murray Hodgkins, NP Christell Faith, PA-C . Marrianne Mood, PA-C  Any Other Special Instructions Will Be Listed Below (If Applicable).

## 2017-11-01 LAB — CBC WITH DIFFERENTIAL/PLATELET
Basophils Absolute: 0 10*3/uL (ref 0.0–0.2)
Basos: 1 %
EOS (ABSOLUTE): 0.2 10*3/uL (ref 0.0–0.4)
Eos: 2 %
Hematocrit: 43.5 % (ref 37.5–51.0)
Hemoglobin: 14 g/dL (ref 13.0–17.7)
Immature Grans (Abs): 0 10*3/uL (ref 0.0–0.1)
Immature Granulocytes: 0 %
Lymphocytes Absolute: 1.6 10*3/uL (ref 0.7–3.1)
Lymphs: 19 %
MCH: 26.6 pg (ref 26.6–33.0)
MCHC: 32.2 g/dL (ref 31.5–35.7)
MCV: 83 fL (ref 79–97)
Monocytes Absolute: 0.7 10*3/uL (ref 0.1–0.9)
Monocytes: 8 %
Neutrophils Absolute: 5.9 10*3/uL (ref 1.4–7.0)
Neutrophils: 70 %
Platelets: 220 10*3/uL (ref 150–450)
RBC: 5.26 x10E6/uL (ref 4.14–5.80)
RDW: 14.4 % (ref 12.3–15.4)
WBC: 8.4 10*3/uL (ref 3.4–10.8)

## 2017-11-01 LAB — HEPATIC FUNCTION PANEL
ALT: 50 IU/L — ABNORMAL HIGH (ref 0–44)
AST: 42 IU/L — ABNORMAL HIGH (ref 0–40)
Albumin: 4.3 g/dL (ref 3.5–5.5)
Alkaline Phosphatase: 87 IU/L (ref 39–117)
Bilirubin Total: 0.3 mg/dL (ref 0.0–1.2)
Bilirubin, Direct: 0.12 mg/dL (ref 0.00–0.40)
Total Protein: 6.6 g/dL (ref 6.0–8.5)

## 2017-11-01 LAB — BASIC METABOLIC PANEL
BUN/Creatinine Ratio: 14 (ref 9–20)
BUN: 18 mg/dL (ref 6–24)
CO2: 18 mmol/L — ABNORMAL LOW (ref 20–29)
Calcium: 8.7 mg/dL (ref 8.7–10.2)
Chloride: 106 mmol/L (ref 96–106)
Creatinine, Ser: 1.33 mg/dL — ABNORMAL HIGH (ref 0.76–1.27)
GFR calc Af Amer: 69 mL/min/{1.73_m2} (ref >59)
GFR calc non Af Amer: 59 mL/min/{1.73_m2} — ABNORMAL LOW (ref >59)
Glucose: 118 mg/dL — ABNORMAL HIGH (ref 65–99)
Potassium: 4.4 mmol/L (ref 3.5–5.2)
Sodium: 141 mmol/L (ref 134–144)

## 2017-11-01 LAB — LIPID PANEL
Chol/HDL Ratio: 4.7 ratio (ref 0.0–5.0)
Cholesterol, Total: 170 mg/dL (ref 100–199)
HDL: 36 mg/dL — ABNORMAL LOW (ref >39)
LDL Calculated: 108 mg/dL — ABNORMAL HIGH (ref 0–99)
Triglycerides: 130 mg/dL (ref 0–149)
VLDL Cholesterol Cal: 26 mg/dL (ref 5–40)

## 2017-11-04 ENCOUNTER — Other Ambulatory Visit: Payer: Self-pay | Admitting: Internal Medicine

## 2017-11-05 ENCOUNTER — Other Ambulatory Visit: Payer: Self-pay | Admitting: Cardiovascular Disease

## 2017-11-05 ENCOUNTER — Telehealth: Payer: Self-pay | Admitting: Internal Medicine

## 2017-11-05 NOTE — Telephone Encounter (Addendum)
Copied from [place] [phone]. Topic: Quick Communication - See Telephone Encounter >> Nov 05, 2017  9:58 AM Ivar Drape wrote: CRM for notification. See Telephone encounter for: 11/05/17. Patient had labs done with his Cardiologist and found out his Liver Enzymes were slightly high. They told him to let his PCP know in case something needed to be done about it.  The labs was sent to the practice.  Please advise.

## 2017-11-05 NOTE — Telephone Encounter (Signed)
Can have him repeat CMET and acute hep panel in 1 month. He does not drink alcohol. Avoid tylenol containing products. If remains elevated, will get RUQ ultrasound.

## 2017-11-05 NOTE — Telephone Encounter (Signed)
Refill Request.  

## 2017-11-06 NOTE — Telephone Encounter (Signed)
Patient calling and would like a call back regarding his high liver enzymes. States that he has surgery scheduled on his left rotator cuff on 11/18/17. Wants to know if this will effect anything for his surgery? Please advise.

## 2017-11-07 ENCOUNTER — Telehealth: Payer: Self-pay | Admitting: Internal Medicine

## 2017-11-07 NOTE — Telephone Encounter (Signed)
Will not affect anything for surgery, see my previous note.

## 2017-11-07 NOTE — Telephone Encounter (Signed)
Pt's wife came in to check and see if Richard [name] needs to follow up with Rollene Fare since he was seen by a cardiologist. Please advise pt

## 2017-11-07 NOTE — Telephone Encounter (Signed)
Called pt, no answer and VM not set up... Pt just needs to come back in 1 month to recheck labs and if still abnormal will need additional diagnostic studies and should not affect surgery... Pt does not need to f/u with [facility] if saw Cardiology

## 2017-11-07 NOTE — Telephone Encounter (Signed)
Called pt, no answer and VM not set up... Pt just needs to come back in 1 month to recheck labs and if still abnormal will need additional diagnostic studies and should not affect surgery

## 2017-11-18 ENCOUNTER — Telehealth: Payer: Self-pay | Admitting: Cardiovascular Disease

## 2017-11-18 NOTE — Telephone Encounter (Signed)
STAT if HR is under 50 or over 120 (normal HR is 60-100 beats per minute)  1) What is your heart rate? 45  2) Do you have a log of your heart rate readings (document readings)? No trends 60'-70's   3) Do you have any other symptoms? Drained from surgery really relaxed dizzy when sitting up from surgery

## 2017-11-18 NOTE — Telephone Encounter (Signed)
The patient called in stating that he had shoulder surgery this morning. His blood pressure was 139/70 and heart rate was 45. When he got home his heart rate was 47.  He has been advised to relax this evening. Per Dr. Fletcher Anon, he should hold the Metoprolol if his heart rate is under 60 bpm.

## 2017-11-19 NOTE — Telephone Encounter (Addendum)
Pt c/o Shortness Of Breath: STAT if SOB developed within the last 24 hours or pt is noticeably SOB on the phone  1. Are you currently SOB (can you hear that pt is SOB on the phone)? Yes   2. How long have you been experiencing SOB? Since post op dc   3. Are you SOB when sitting or when up moving around?  All the time cant get a full deep breath   4. Are you currently experiencing any other symptoms? Chest hurts when taking a deep breath   HR continues 40's -60's o2 sat 91-94 %

## 2017-11-19 NOTE — Telephone Encounter (Signed)
Attempt to return call-no answer and unable to leave VM (VM not set up)

## 2017-11-21 ENCOUNTER — Encounter: Payer: Self-pay | Admitting: Emergency Medicine

## 2017-11-21 ENCOUNTER — Emergency Department: Payer: Managed Care, Other (non HMO)

## 2017-11-21 ENCOUNTER — Emergency Department
Admission: EM | Admit: 2017-11-21 | Discharge: 2017-11-21 | Disposition: A | Discharge status: Home / Self Care | Payer: Managed Care, Other (non HMO) | Attending: Emergency Medicine | Admitting: Emergency Medicine

## 2017-11-21 DIAGNOSIS — J181 Lobar pneumonia, unspecified organism: Secondary | ICD-10-CM | POA: Diagnosis not present

## 2017-11-21 DIAGNOSIS — I5032 Chronic diastolic (congestive) heart failure: Secondary | ICD-10-CM | POA: Insufficient documentation

## 2017-11-21 DIAGNOSIS — I110 Hypertensive heart disease with heart failure: Secondary | ICD-10-CM | POA: Insufficient documentation

## 2017-11-21 DIAGNOSIS — J9811 Atelectasis: Secondary | ICD-10-CM | POA: Diagnosis not present

## 2017-11-21 DIAGNOSIS — R001 Bradycardia, unspecified: Secondary | ICD-10-CM | POA: Diagnosis not present

## 2017-11-21 DIAGNOSIS — Z7901 Long term (current) use of anticoagulants: Secondary | ICD-10-CM | POA: Diagnosis not present

## 2017-11-21 DIAGNOSIS — Z96653 Presence of artificial knee joint, bilateral: Secondary | ICD-10-CM | POA: Diagnosis not present

## 2017-11-21 DIAGNOSIS — Z79899 Other long term (current) drug therapy: Secondary | ICD-10-CM | POA: Diagnosis not present

## 2017-11-21 DIAGNOSIS — R599 Enlarged lymph nodes, unspecified: Secondary | ICD-10-CM | POA: Diagnosis not present

## 2017-11-21 DIAGNOSIS — R0602 Shortness of breath: Secondary | ICD-10-CM

## 2017-11-21 DIAGNOSIS — R591 Generalized enlarged lymph nodes: Secondary | ICD-10-CM

## 2017-11-21 LAB — CBC WITH DIFFERENTIAL/PLATELET
Abs Immature Granulocytes: 0.02 10*3/uL (ref 0.00–0.07)
Basophils Absolute: 0.1 10*3/uL (ref 0.0–0.1)
Basophils Relative: 1 %
Eosinophils Absolute: 0.2 10*3/uL (ref 0.0–0.5)
Eosinophils Relative: 2 %
HCT: 45.2 % (ref 39.0–52.0)
Hemoglobin: 14.2 g/dL (ref 13.0–17.0)
Immature Granulocytes: 0 %
Lymphocytes Relative: 25 %
Lymphs Abs: 2 10*3/uL (ref 0.7–4.0)
MCH: 26.9 pg (ref 26.0–34.0)
MCHC: 31.4 g/dL (ref 30.0–36.0)
MCV: 85.6 fL (ref 80.0–100.0)
Monocytes Absolute: 0.7 10*3/uL (ref 0.1–1.0)
Monocytes Relative: 8 %
Neutro Abs: 5.3 10*3/uL (ref 1.7–7.7)
Neutrophils Relative %: 64 %
Platelets: 189 10*3/uL (ref 150–400)
RBC: 5.28 MIL/uL (ref 4.22–5.81)
RDW: 14.7 % (ref 11.5–15.5)
WBC: 8.3 10*3/uL (ref 4.0–10.5)
nRBC: 0 % (ref 0.0–0.2)

## 2017-11-21 LAB — COMPREHENSIVE METABOLIC PANEL
ALT: 38 U/L (ref 0–44)
AST: 32 U/L (ref 15–41)
Albumin: 3.8 g/dL (ref 3.5–5.0)
Alkaline Phosphatase: 65 U/L (ref 38–126)
Anion gap: 7 (ref 5–15)
BUN: 21 mg/dL — ABNORMAL HIGH (ref 6–20)
CO2: 27 mmol/L (ref 22–32)
Calcium: 9 mg/dL (ref 8.9–10.3)
Chloride: 105 mmol/L (ref 98–111)
Creatinine, Ser: 1.09 mg/dL (ref 0.61–1.24)
GFR calc Af Amer: >60 mL/min (ref >60)
GFR calc non Af Amer: >60 mL/min (ref >60)
Glucose, Bld: 109 mg/dL — ABNORMAL HIGH (ref 70–99)
Potassium: 4 mmol/L (ref 3.5–5.1)
Sodium: 139 mmol/L (ref 135–145)
Total Bilirubin: 0.7 mg/dL (ref 0.3–1.2)
Total Protein: 7.1 g/dL (ref 6.5–8.1)

## 2017-11-21 LAB — TROPONIN I: Troponin I: <0.03 ng/mL (ref <0.03)

## 2017-11-21 LAB — BRAIN NATRIURETIC PEPTIDE: B Natriuretic Peptide: 81 pg/mL (ref 0.0–100.0)

## 2017-11-21 MED ORDER — IOPAMIDOL (ISOVUE-370) INJECTION 76%
100.0000 mL | Freq: Once | INTRAVENOUS | Status: AC | PRN
  Administered 2017-11-21: 100 mL via INTRAVENOUS

## 2017-11-21 MED ORDER — DOXYCYCLINE HYCLATE 100 MG PO TABS
100.0000 mg | ORAL_TABLET | Freq: Once | ORAL | Status: AC
  Administered 2017-11-21: 100 mg via ORAL
  Filled 2017-11-21: qty 1

## 2017-11-21 MED ORDER — DOXYCYCLINE HYCLATE 100 MG PO CAPS: 100.0000 mg | ORAL_CAPSULE | Freq: Two times a day (BID) | ORAL | 0 refills | Status: DC

## 2017-11-21 NOTE — ED Notes (Signed)
Patient transported to CT 

## 2017-11-21 NOTE — Telephone Encounter (Signed)
Returned the call to the patient. He stated that he is still having low heart rates in the 40's. He had been advised to hold his Metoprolol (see prior note) if his heart rate was lower than 60 but he thinks he has been taking it. He stated that his wife handles his medications and he is not sure.  He has been having increased shortness of breath and chest discomfort when he takes a deep breath. This has been progressively getting worse since his shoulder surgery on 10/29.  He has been advised to go to the ED for further assessment and stated that he was already on the way.

## 2017-11-21 NOTE — ED Provider Notes (Addendum)
[facility] Emergency Department Provider Note  ____________________________________________   First MD Initiated Contact with Patient 11/21/17 1147     (approximate)  I have reviewed the triage vital signs and the nursing notes.   HISTORY  Chief Complaint Shortness of Breath   HPI Richard [name] is a 57 y.o. male with a history of atrial fibrillation on Eliquis as well as bilateral pulmonary embolus with a recent left-sided rotator cuff surgery this past Tuesday who was presented to the emergency department with shortness of breath as well as chest pain when taking deep breath.  He says that he has been consistently short of breath since the surgery this past Tuesday.  Denies fever but says that he has had a mild cough that is nonproductive.  Says that the chest pain is to the center of his chest under the sternum when he takes a deep breath.  Does not note any leg swelling but says that he occasionally takes a "water pill" if he has lower extremity swelling.  Says that he also been off his Eliquis leading up to the surgery and then started taking it again this past Tuesday evening.  Says that he is short of breath even at rest.  Also concerned about a heart rate of 42 while at home this morning.  Says he takes flecainide as well as metoprolol.  Called his cardiologist office who told him to report to the emergency department for further work-up and treatment.  Past Medical History:  Diagnosis Date  . Arthritis   . Arthrofibrosis of total knee arthroplasty ([place]) 05/30/2015  . Bilateral pulmonary embolism ([place])    a. 03/2015 CTA: acute bilat PE; b. IVC filter placed in 2017--> removed 2018.  Marland Kitchen Chest pain    a. 2015 Abnl MV;  b. 2015 Cath: nl cors.  . Chronic diastolic CHF (congestive heart failure) ([place])    a. 03/2015 Echo: EF 55-65%, poor windows, mildly dil RV.  Marland Kitchen DVT (deep venous thrombosis) ([place])    a. 03/2015 U/S: occlusive DVT w/in the distal aspect of the  Left fem vein through the L popliteal vein.  Marland Kitchen GERD (gastroesophageal reflux disease)   . Headache   . Hypertensive heart disease   . Obesity   . OSA (obstructive sleep apnea)    uses CPAP nightly  . PAF (paroxysmal atrial fibrillation) ([place])   . Primary localized osteoarthritis of left knee    a. 11/2014 s/p L TKA.  . Primary localized osteoarthritis of right knee    a. 02/2014 s/p R Partial Knee arthroplasty.  . Stiffness of left knee    a. 12/2014 s/p manipulation under anesthesia.  . Urine incontinence     Patient Active Problem List   Diagnosis Date Noted  . Arthrofibrosis of total knee arthroplasty ([place]) 05/30/2015  . PAF (paroxysmal atrial fibrillation) ([place]) 03/30/2015  . Peroneal DVT (deep venous thrombosis)   . Pulmonary embolism ([place]) 03/27/2015  . Hypertension 08/17/2011  . LVH (left ventricular hypertrophy) 08/17/2011  . GERD (gastroesophageal reflux disease) 08/17/2011  . OSA (obstructive sleep apnea) 08/16/2011    Past Surgical History:  Procedure Laterality Date  . APPENDECTOMY    . CARDIAC CATHETERIZATION    . EXAM UNDER ANESTHESIA WITH MANIPULATION OF KNEE Left 05/30/2015   Procedure: EXAM UNDER ANESTHESIA WITH MANIPULATION OF LEFT KNEE;  Surgeon: Marchia Bond, MD;  Location: [place];  Service: Orthopedics;  Laterality: Left;  . I&D KNEE WITH POLY EXCHANGE Left 10/30/2015   Procedure:  IRRIGATION AND DEBRIDEMENT KNEE WITH POLY EXCHANGE PLACE SPACERS;  Surgeon: Frederik Pear, MD;  Location: [place];  Service: Orthopedics;  Laterality: Left;  OFF ELIQUIST 3 DAYS  . IVC FILTER PLACEMENT ([facility] HX)  04/13/15  . IVC FILTER REMOVAL N/A 08/13/2016   Procedure: IVC Filter Removal;  Surgeon: Katha Cabal, MD;  Location: [place] [facility];  Service: Cardiovascular;  Laterality: N/A;  . JOINT REPLACEMENT    . KNEE ARTHROSCOPY Left 08/08/2015   Procedure: LEFT KNEE MANIPULATION WITH ARTHROSCOPIC LYSIS OF ADHESIONS AND ASPIRATION;  Surgeon: Marchia Bond, MD;  Location: [place];  Service: Orthopedics;  Laterality: Left;  . KNEE CLOSED REDUCTION Left 01/20/2015   Procedure: LEFT KNEE MANIPULATION;  Surgeon: Marchia Bond, MD;  Location: [place];  Service: Orthopedics;  Laterality: Left;  . LEFT HEART CATHETERIZATION WITH CORONARY ANGIOGRAM N/A 01/12/2014   Procedure: LEFT HEART CATHETERIZATION WITH CORONARY ANGIOGRAM;  Surgeon: Jettie Booze, MD;  Location: [facility];  Service: Cardiovascular;  Laterality: N/A;  . ORTHOPEDIC SURGERY Left    arthroscopy x3  . PARTIAL KNEE ARTHROPLASTY Right 02/25/2014   Procedure: RIGHT KNEE ARTHROPLASTY CONDYLE AND PLATEAU MEDIAL COMPARTMENT ;  Surgeon: Johnny Bridge, MD;  Location: [place];  Service: Orthopedics;  Laterality: Right;  . PERIPHERAL VASCULAR CATHETERIZATION N/A 03/29/2015   Procedure: IVC Filter Insertion, and possible thrombectomy;  Surgeon: Katha Cabal, MD;  Location: [place] [facility];  Service: Cardiovascular;  Laterality: N/A;  . TOTAL KNEE ARTHROPLASTY  12/06/2014   Procedure: TOTAL KNEE ARTHROPLASTY;  Surgeon: Marchia Bond, MD;  Location: [place];  Service: Orthopedics;;  . TOTAL KNEE REVISION Left 02/08/2016   Procedure: TOTAL KNEE REVISION;  Surgeon: Frederik Pear, MD;  Location: [place];  Service: Orthopedics;  Laterality: Left;    Prior to Admission medications   Medication Sig Start Date End Date Taking? Authorizing Provider  acetaminophen (TYLENOL) 650 MG CR tablet Take 1,300 mg by mouth every 8 (eight) hours as needed for pain.    [provider]  apixaban (ELIQUIS) 5 MG TABS tablet TAKE 1 TABLET (5 MG TOTAL) BY MOUTH 2 (TWO) TIMES DAILY. Pt needs labs to assess kidney function prior to further refills. 09/24/17   Wellington Hampshire, MD  baclofen (LIORESAL) 10 MG tablet TAKE 1-2 TABLETS (10-20 MG TOTAL) BY MOUTH 3 (THREE) TIMES DAILY AS NEEDED FOR MUSCLE SPASMS. 05/06/16   Jearld Fenton, NP  bismuth subsalicylate (PEPTO-BISMOL) 262 MG chewable tablet  Chew 2 tablets (524 mg total) by mouth 4 (four) times daily -  before meals and at bedtime. Patient taking differently: Chew 524 mg by mouth 3 (three) times daily as needed for indigestion.  11/14/15   Jearld Fenton, NP  cyanocobalamin (,VITAMIN B-12,) 1000 MCG/ML injection Inject 1,000 mcg into the muscle every 30 (thirty) days.    [provider]  docusate sodium (COLACE) 100 MG capsule Take 100-200 mg by mouth 2 (two) times daily as needed for mild constipation.    [provider]  ELIQUIS 5 MG TABS tablet TAKE 1 TABLET BY MOUTH TWICE A DAY 11/05/17   Wellington Hampshire, MD  esomeprazole (NEXIUM) 40 MG capsule Take 40 mg by mouth daily as needed (for acid reflux).     [provider]  flecainide (TAMBOCOR) 100 MG tablet TAKE 1 TABLET BY MOUTH TWICE A DAY 08/05/17   Wellington Hampshire, MD  furosemide (LASIX) 40 MG tablet TAKE 1 TABLET (40 MG  TOTAL) BY MOUTH DAILY. MUST SCHEDULE ANNUAL EXAM 11/04/17   Jearld Fenton, NP  gabapentin (NEURONTIN) 100 MG capsule Take 1 capsule (100 mg total) by mouth daily as needed. Patient taking differently: Take 100 mg by mouth daily as needed (for hand pain).  12/20/16   Jearld Fenton, NP  HYDROcodone-acetaminophen (NORCO/VICODIN) 5-325 MG tablet Take 1 tablet by mouth every 8 (eight) hours. 08/11/17   Jearld Fenton, NP  metoprolol succinate (TOPROL-XL) 25 MG 24 hr tablet TAKE 1 TABLET (25 MG TOTAL) BY MOUTH 2 (TWO) TIMES DAILY. TAKE WITH OR IMMEDIATELY FOLLOWING A MEAL. 08/04/17 11/02/17  Minna Merritts, MD  silver sulfADIAZINE (SILVADENE) 1 % cream Apply 1 application topically daily. 09/12/16   Jearld Fenton, NP  traMADol (ULTRAM) 50 MG tablet Take 2 tablets (100 mg total) by mouth at bedtime as needed. 09/18/17   Jearld Fenton, NP    Allergies Penicillin g; Penicillins; and Vancomycin  Family History  Problem Relation Age of Onset  . Coronary artery disease Mother   . Hyperlipidemia Mother   . Hypertension Mother   .  Arthritis Mother   . Coronary artery disease Father   . Heart disease Paternal Grandmother   . Alzheimer's disease Paternal Grandfather     Social History Social History   Tobacco Use  . Smoking status: Never Smoker  . Smokeless tobacco: Never Used  Substance Use Topics  . Alcohol use: Yes    Comment: occassional  . Drug use: No    Review of Systems  Constitutional: No fever/chills Eyes: No visual changes. ENT: No sore throat. Cardiovascular: As above Respiratory: As above Gastrointestinal: No abdominal pain.  No nausea, no vomiting.  No diarrhea.  No constipation. Genitourinary: Negative for dysuria. Musculoskeletal: Negative for back pain. Skin: Negative for rash. Neurological: Negative for headaches, focal weakness or numbness.   ____________________________________________   PHYSICAL EXAM:  VITAL SIGNS: ED Triage Vitals [11/21/17 1134]  Enc Vitals Group     BP (!) 147/71     Pulse Rate (!) 51     Resp (!) 22     Temp 98.1 F (36.7 C)     Temp Source Oral     SpO2 93 %     Weight 300 lb (136.1 kg)     Height 5\' 9"  (1.753 m)     Head Circumference      Peak Flow      Pain Score 6     Pain Loc      Pain Edu?      Excl. in [place]?     Constitutional: Alert and oriented. Well appearing and in no acute distress. Eyes: Conjunctivae are normal.  Head: Atraumatic. Nose: No congestion/rhinnorhea. Mouth/Throat: Mucous membranes are moist.  Neck: No stridor.   Cardiovascular: Bradycardia with a heart rate consistently in the 50s.  Ranges from 52-56 in the room. regular rhythm. Grossly normal heart sounds.  Good peripheral circulation. Respiratory: Normal respiratory effort.  No retractions. Lungs CTAB. Gastrointestinal: Soft and nontender. No distention.  Musculoskeletal: No lower extremity tenderness nor edema.  No joint effusions.    Neurovascularly intact.  Dressings over the left shoulder are clean dry and intact. Left upper extremity in a sling.  No edema  or swelling to the left upper extremity.  Neurologic:  Normal speech and language. No gross focal neurologic deficits are appreciated. Skin:  Skin is warm, dry and intact. No rash noted. Psychiatric: Mood and affect are normal. Speech and behavior  are normal.  ____________________________________________   LABS (all labs ordered are listed, but only abnormal results are displayed)  Labs Reviewed  COMPREHENSIVE METABOLIC PANEL - Abnormal; Notable for the following components:      Result Value   Glucose, Bld 109 (*)    BUN 21 (*)    All other components within normal limits  CBC WITH DIFFERENTIAL/PLATELET  TROPONIN I  BRAIN NATRIURETIC PEPTIDE   ____________________________________________  EKG  ED ECG REPORT I, Doran Stabler, the attending physician, personally viewed and interpreted this ECG.   Date: 11/21/2017  EKG Time: 1141  Rate: 60  Rhythm: normal sinus rhythm  Axis: Axis deviation  Intervals:none  ST&T Change: No ST segment elevation or depression.  No abnormal T wave inversion.  ____________________________________________  RADIOLOGY  X-ray without acute process.  CT angiography without pulmonary embolus.  Atelectasis with patchy infiltrate to the left lower lobe.  Also several enlarged lymph nodes of uncertain etiology.  Slightly more prominent in the exam of February 2019. ____________________________________________   PROCEDURES  Procedure(s) performed:   Procedures  Critical Care performed:   ____________________________________________   INITIAL IMPRESSION / ASSESSMENT AND PLAN / ED COURSE  Pertinent labs & imaging results that were available during my care of the patient were reviewed by me and considered in my medical decision making (see chart for details).  Differential includes, but is not limited to, viral syndrome, bronchitis including COPD exacerbation, pneumonia, reactive airway disease including asthma, CHF including exacerbation  with or without pulmonary/interstitial edema, pneumothorax, ACS, thoracic trauma, and pulmonary embolism. As part of my medical decision making, I reviewed the following data within the [facility] chart reviewed and Notes from prior ED visits  ----------------------------------------- 2:19 PM on 11/21/2017 -----------------------------------------  Atelectasis and likely an early developing infiltrate.  However, patient with a respiratory rate of 17 in the room at this time.  No respiratory distress.  Normal oxygen saturation.  Patient counseled regarding the CT NGO findings and the need for incentive spirometry as well as antibiotics.  He is familiar with incentive spirometry and has used in the past.  We discussed using it every time the commercial comes on while he is watching television.  We also discussed the enlarged lymph nodes which she is also aware of and will be following up with with his primary care doctor.  He is understanding of the diagnosis as well as treatment plan willing to comply.  I believe the patient will be appropriate for discharge to home as he is nontoxic in appearance and has had reassuring blood work with a normal respiratory rate at this time and normal oxygen saturation.  Patient also had a concern regarding his bradycardia.  I discussed this with his cardiologist, Dr. Fletcher Anon, who recommends taking the patient off of twice daily metoprolol and starting daily metoprolol.  Cardiology also recommended that if the heart rate has not increased to the 50s over the next few days that the patient may completely discontinue his beta-blocker.  Patient will be following up with cardiology.  We discussed this plan and patient is understanding and willing to comply. ____________________________________________   FINAL CLINICAL IMPRESSION(S) / ED DIAGNOSES  Final diagnoses:  SOB (shortness of breath)   Lymphadenopathy.  Atelectasis.  Pneumonia.   NEW  MEDICATIONS STARTED DURING THIS VISIT:  New Prescriptions   No medications on file     Note:  This document was prepared using Dragon voice recognition software and may include unintentional dictation errors.  Orbie Pyo, MD 11/21/17 [place], Randall An, MD 11/21/17 [phone]

## 2017-11-21 NOTE — ED Triage Notes (Signed)
Pt has surgery on left shoulder 10/29 and states as soon as he was discharged be has been short of breath. Pt's oxygen in triage 93%.

## 2017-11-21 NOTE — ED Notes (Signed)
Pt unhooked to go to the bathroom at this time, pt educated on using incentive spirometer as well. Pt verbalized understanding and states he has used one in the past.

## 2017-11-21 NOTE — ED Notes (Signed)
Patient back from CT.

## 2017-11-21 NOTE — Telephone Encounter (Signed)
Pt states his HR is still staying in the 40s at rest. Please call to discuss

## 2017-11-25 ENCOUNTER — Ambulatory Visit (INDEPENDENT_AMBULATORY_CARE_PROVIDER_SITE_OTHER): Payer: Managed Care, Other (non HMO) | Admitting: Cardiovascular Disease

## 2017-11-25 ENCOUNTER — Encounter: Payer: Self-pay | Admitting: Cardiovascular Disease

## 2017-11-25 VITALS — BP 110/64 | HR 63 | Ht 69.0 in | Wt 300.5 lb

## 2017-11-25 DIAGNOSIS — I480 Paroxysmal atrial fibrillation: Secondary | ICD-10-CM | POA: Diagnosis not present

## 2017-11-25 DIAGNOSIS — Z86711 Personal history of pulmonary embolism: Secondary | ICD-10-CM | POA: Diagnosis not present

## 2017-11-25 MED ORDER — METOPROLOL SUCCINATE ER 25 MG PO TB24: 25.0000 mg | ORAL_TABLET | Freq: Every day | ORAL | 3 refills | Status: DC

## 2017-11-25 NOTE — Progress Notes (Signed)
Cardiology Office Note   Date:  11/25/2017   ID:  Richard [name], DOB 09/09/1960, MRN [id number]  PCP:  Richard Fenton, NP  Cardiologist:   Richard Sacramento, MD   Chief Complaint  Patient presents with  . OTHER    ED f/u chest pain/sob. Meds reviewed verbally with pt.      History of Present Illness: Richard [name] is a 57 y.o. male who presents for a follow-up visit regarding paroxysmal atrial fibrillation and preop cardiovascular evaluation for shoulder surgery.. The patient has history of normal cardiac catheterization in 2015 after an abnormal stress test, hypertension, obesity, osteoarthritis status post bilateral knee surgeries.   The patient had multiple knee surgeries starting in 2016. He developed left lower extremity DVT complicated by massive pulmonary embolism which was treated with catheter-based intervention and IVC filter placement.  He had atrial fibrillation at that time but converted to sinus rhythm. He had intermittent palpitations thought to be due to atrial fibrillation and has been maintaining in sinus rhythm with flecainide. Lifelong anticoagulation has been recommended.  He underwent recent left shoulder surgery without complications.  He went to the emergency room with increased shortness of breath and some chest pain.  CTA showed no evidence of pulmonary embolism but showed left-sided atelectasis and possible early pneumonia.  He was noted to be bradycardic with heart rate in the 40s and thus I recommended decreasing the dose of Toprol to 25 mg once daily.  The patient was prescribed antibiotics and he reports improvement in symptoms.  No palpitations.  Past Medical History:  Diagnosis Date  . Arthritis   . Arthrofibrosis of total knee arthroplasty ([place]) 05/30/2015  . Bilateral pulmonary embolism ([place])    a. 03/2015 CTA: acute bilat PE; b. IVC filter placed in 2017--> removed 2018.  Marland Kitchen Chest pain    a. 2015 Abnl MV;  b. 2015 Cath: nl cors.  . Chronic  diastolic CHF (congestive heart failure) ([place])    a. 03/2015 Echo: EF 55-65%, poor windows, mildly dil RV.  Marland Kitchen DVT (deep venous thrombosis) ([place])    a. 03/2015 U/S: occlusive DVT w/in the distal aspect of the Left fem vein through the L popliteal vein.  Marland Kitchen GERD (gastroesophageal reflux disease)   . Headache   . Hypertensive heart disease   . Obesity   . OSA (obstructive sleep apnea)    uses CPAP nightly  . PAF (paroxysmal atrial fibrillation) ([place])   . Primary localized osteoarthritis of left knee    a. 11/2014 s/p L TKA.  . Primary localized osteoarthritis of right knee    a. 02/2014 s/p R Partial Knee arthroplasty.  . Stiffness of left knee    a. 12/2014 s/p manipulation under anesthesia.  . Urine incontinence     Past Surgical History:  Procedure Laterality Date  . APPENDECTOMY    . CARDIAC CATHETERIZATION    . EXAM UNDER ANESTHESIA WITH MANIPULATION OF KNEE Left 05/30/2015   Procedure: EXAM UNDER ANESTHESIA WITH MANIPULATION OF LEFT KNEE;  Surgeon: Marchia Bond, MD;  Location: [facility];  Service: Orthopedics;  Laterality: Left;  . I&D KNEE WITH POLY EXCHANGE Left 10/30/2015   Procedure: IRRIGATION AND DEBRIDEMENT KNEE WITH POLY EXCHANGE PLACE SPACERS;  Surgeon: Frederik Pear, MD;  Location: [facility];  Service: Orthopedics;  Laterality: Left;  OFF ELIQUIST 3 DAYS  . IVC FILTER PLACEMENT ([facility] HX)  04/13/15  . IVC FILTER REMOVAL N/A 08/13/2016   Procedure: IVC Filter Removal;  Surgeon: Hortencia Pilar  G, MD;  Location: [facility];  Service: Cardiovascular;  Laterality: N/A;  . JOINT REPLACEMENT    . KNEE ARTHROSCOPY Left 08/08/2015   Procedure: LEFT KNEE MANIPULATION WITH ARTHROSCOPIC LYSIS OF ADHESIONS AND ASPIRATION;  Surgeon: Marchia Bond, MD;  Location: [facility];  Service: Orthopedics;  Laterality: Left;  . KNEE CLOSED REDUCTION Left 01/20/2015   Procedure: LEFT KNEE MANIPULATION;  Surgeon: Marchia Bond, MD;  Location: [facility];  Service: Orthopedics;  Laterality:  Left;  . LEFT HEART CATHETERIZATION WITH CORONARY ANGIOGRAM N/A 01/12/2014   Procedure: LEFT HEART CATHETERIZATION WITH CORONARY ANGIOGRAM;  Surgeon: Jettie Booze, MD;  Location: [facility];  Service: Cardiovascular;  Laterality: N/A;  . ORTHOPEDIC SURGERY Left    arthroscopy x3  . PARTIAL KNEE ARTHROPLASTY Right 02/25/2014   Procedure: RIGHT KNEE ARTHROPLASTY CONDYLE AND PLATEAU MEDIAL COMPARTMENT ;  Surgeon: Johnny Bridge, MD;  Location: [facility];  Service: Orthopedics;  Laterality: Right;  . PERIPHERAL VASCULAR CATHETERIZATION N/A 03/29/2015   Procedure: IVC Filter Insertion, and possible thrombectomy;  Surgeon: Katha Cabal, MD;  Location: [place] [facility];  Service: Cardiovascular;  Laterality: N/A;  . ROTATOR CUFF REPAIR Left   . TOTAL KNEE ARTHROPLASTY  12/06/2014   Procedure: TOTAL KNEE ARTHROPLASTY;  Surgeon: Marchia Bond, MD;  Location: [place];  Service: Orthopedics;;  . TOTAL KNEE REVISION Left 02/08/2016   Procedure: TOTAL KNEE REVISION;  Surgeon: Frederik Pear, MD;  Location: [place];  Service: Orthopedics;  Laterality: Left;     Current Outpatient Medications  Medication Sig Dispense Refill  . acetaminophen (TYLENOL) 650 MG CR tablet Take 1,300 mg by mouth every 8 (eight) hours as needed for pain.    Marland Kitchen apixaban (ELIQUIS) 5 MG TABS tablet TAKE 1 TABLET (5 MG TOTAL) BY MOUTH 2 (TWO) TIMES DAILY. Pt needs labs to assess kidney function prior to further refills. (Patient taking differently: TAKE 1 TABLET (5 MG TOTAL) BY MOUTH 2 (TWO) TIMES DAILY.) 60 tablet 0  . baclofen (LIORESAL) 10 MG tablet TAKE 1-2 TABLETS (10-20 MG TOTAL) BY MOUTH 3 (THREE) TIMES DAILY AS NEEDED FOR MUSCLE SPASMS. 180 tablet 0  . bismuth subsalicylate (PEPTO-BISMOL) 262 MG chewable tablet Chew 2 tablets (524 mg total) by mouth 4 (four) times daily -  before meals and at bedtime. (Patient taking differently: Chew 524 mg by mouth 3 (three) times daily as needed for indigestion. ) 112  tablet 0  . docusate sodium (COLACE) 100 MG capsule Take 100-200 mg by mouth 2 (two) times daily as needed for mild constipation.    Marland Kitchen doxycycline (VIBRAMYCIN) 100 MG capsule Take 1 capsule (100 mg total) by mouth 2 (two) times daily. 20 capsule 0  . esomeprazole (NEXIUM) 40 MG capsule Take 40 mg by mouth daily as needed (for acid reflux).     . flecainide (TAMBOCOR) 100 MG tablet TAKE 1 TABLET BY MOUTH TWICE A DAY 60 tablet 3  . furosemide (LASIX) 40 MG tablet TAKE 1 TABLET (40 MG TOTAL) BY MOUTH DAILY. MUST SCHEDULE ANNUAL EXAM (Patient taking differently: Take 40 mg by mouth daily as needed. MUST SCHEDULE ANNUAL EXAM) 30 tablet 0  . gabapentin (NEURONTIN) 100 MG capsule Take 1 capsule (100 mg total) by mouth daily as needed. (Patient taking differently: Take 100 mg by mouth daily as needed (for hand pain). ) 30 capsule 2  . HYDROcodone-acetaminophen (NORCO/VICODIN) 5-325 MG tablet Take 1 tablet by mouth every 8 (eight) hours. 10 tablet 0  .  traMADol (ULTRAM) 50 MG tablet Take 2 tablets (100 mg total) by mouth at bedtime as needed. 60 tablet 0  . metoprolol succinate (TOPROL-XL) 25 MG 24 hr tablet TAKE 1 TABLET (25 MG TOTAL) BY MOUTH 2 (TWO) TIMES DAILY. TAKE WITH OR IMMEDIATELY FOLLOWING A MEAL. (Patient taking differently: Take 25 mg by mouth daily. Take with or immediately following a meal.) 180 tablet 3   No current facility-administered medications for this visit.     Allergies:   Penicillin g; Penicillins; and Vancomycin    Social History:  The patient  reports that he has never smoked. He has never used smokeless tobacco. He reports that he drinks alcohol. He reports that he does not use drugs.   Family History:  The patient's family history includes Alzheimer's disease in his paternal grandfather; Arthritis in his mother; Coronary artery disease in his father and mother; Heart disease in his paternal grandmother; Hyperlipidemia in his mother; Hypertension in his mother.    ROS:   Please see the history of present illness.   Otherwise, review of systems are positive for none.   All other systems are reviewed and negative.    PHYSICAL EXAM: VS:  BP 110/64 (BP Location: Right Arm, Patient Position: Sitting, Cuff Size: Large)   Pulse 63   Ht 5\' 9"  (1.753 m)   Wt (!) 300 lb 8 oz (136.3 kg)   BMI 44.38 kg/m  , BMI Body mass index is 44.38 kg/m. GEN: Well nourished, well developed, in no acute distress  HEENT: normal  Neck: no JVD, carotid bruits, or masses Cardiac: RRR; no murmurs, rubs, or gallops,no edema  Respiratory:  clear to auscultation bilaterally, normal work of breathing GI: soft, nontender, nondistended, + BS MS: no deformity or atrophy  Skin: warm and dry, no rash Neuro:  Strength and sensation are intact Psych: euthymic mood, full affect   EKG:  EKG is ordered today. The ekg ordered today demonstrates normal sinus rhythm with left axis deviation pulmonary disease pattern.  Recent Labs: 12/29/2016: Magnesium 1.7 11/21/2017: ALT 38; B Natriuretic Peptide 81.0; BUN 21; Creatinine, Ser 1.09; Hemoglobin 14.2; Platelets 189; Potassium 4.0; Sodium 139    Lipid Panel    Component Value Date/Time   CHOL 170 10/31/2017 1438   TRIG 130 10/31/2017 1438   HDL 36 (L) 10/31/2017 1438   CHOLHDL 4.7 10/31/2017 1438   CHOLHDL 4.1 03/29/2015 0434   VLDL 14 03/29/2015 0434   LDLCALC 108 (H) 10/31/2017 1438      Wt Readings from Last 3 Encounters:  11/25/17 (!) 300 lb 8 oz (136.3 kg)  11/21/17 300 lb (136.1 kg)  10/31/17 (!) 305 lb 4 oz (138.5 kg)       ASSESSMENT AND PLAN:  1.  Paroxysmal atrial fibrillation:  No evidence of recurrent arrhythmia on flecainide. He is tolerating anticoagulation with Eliquis.  He had recent bradycardia that improved after decreasing Toprol to 25 mg once daily.  2. Pulmonary embolism: Given the burden of his pulmonary embolism, recommend continuing anticoagulation.   3. Obesity: I discussed with him the importance of  healthy diet, exercise and weight loss.      Disposition:   FU with me in 6 months  Signed,  Richard Sacramento, MD  11/25/2017 3:40 PM    [facility]

## 2017-11-25 NOTE — Patient Instructions (Signed)

## 2017-11-27 ENCOUNTER — Ambulatory Visit (INDEPENDENT_AMBULATORY_CARE_PROVIDER_SITE_OTHER): Payer: Managed Care, Other (non HMO) | Admitting: Internal Medicine

## 2017-11-27 ENCOUNTER — Encounter: Payer: Self-pay | Admitting: Internal Medicine

## 2017-11-27 VITALS — BP 120/76 | HR 59 | Temp 97.9°F | Wt 300.0 lb

## 2017-11-27 DIAGNOSIS — R599 Enlarged lymph nodes, unspecified: Secondary | ICD-10-CM

## 2017-11-27 DIAGNOSIS — J181 Lobar pneumonia, unspecified organism: Secondary | ICD-10-CM

## 2017-11-27 DIAGNOSIS — J189 Pneumonia, unspecified organism: Secondary | ICD-10-CM

## 2017-11-27 NOTE — Progress Notes (Signed)
Subjective:    Patient ID: Richard [name], male    DOB: 02/11/1960, 57 y.o.   MRN: [id number]  HPI  Patient presents to the clinic today for ER follow-up.  He went to the ER 11/1 for left-sided chest pain, shortness of breath and decreased heart rate.  Symptoms have been persistent since 10/29, status post left shoulder surgery.  Chest x-ray did not show any acute findings.  CTA of the chest was negative for pulmonary embolism but did show possible pneumonia in the left lower lobe.  There are incidental findings of enlarging lymph nodes to the left of the aortic arch, paratracheal, subcarinaland hilar that were concerning.  ECG and labs were unremarkable.  He was started on Doxycycline for his pneumonia and advised to follow-up with PCP for lymph node enlargement.  Since discharge, he reports shortness of breath are improving.  He is starting to feel better.  He has never smoked.  He has no family history of lymphoma or leukemia.  Review of Systems  Past Medical History:  Diagnosis Date  . Arthritis   . Arthrofibrosis of total knee arthroplasty ([place]) 05/30/2015  . Bilateral pulmonary embolism ([place])    a. 03/2015 CTA: acute bilat PE; b. IVC filter placed in 2017--> removed 2018.  Marland Kitchen Chest pain    a. 2015 Abnl MV;  b. 2015 Cath: nl cors.  . Chronic diastolic CHF (congestive heart failure) ([place])    a. 03/2015 Echo: EF 55-65%, poor windows, mildly dil RV.  Marland Kitchen DVT (deep venous thrombosis) ([place])    a. 03/2015 U/S: occlusive DVT w/in the distal aspect of the Left fem vein through the L popliteal vein.  Marland Kitchen GERD (gastroesophageal reflux disease)   . Headache   . Hypertensive heart disease   . Obesity   . OSA (obstructive sleep apnea)    uses CPAP nightly  . PAF (paroxysmal atrial fibrillation) ([place])   . Primary localized osteoarthritis of left knee    a. 11/2014 s/p L TKA.  . Primary localized osteoarthritis of right knee    a. 02/2014 s/p R Partial Knee arthroplasty.  . Stiffness of left knee    a. 12/2014 s/p manipulation under anesthesia.  . Urine incontinence     Current Outpatient Medications  Medication Sig Dispense Refill  . acetaminophen (TYLENOL) 650 MG CR tablet Take 1,300 mg by mouth every 8 (eight) hours as needed for pain.    Marland Kitchen apixaban (ELIQUIS) 5 MG TABS tablet TAKE 1 TABLET (5 MG TOTAL) BY MOUTH 2 (TWO) TIMES DAILY. Pt needs labs to assess kidney function prior to further refills. (Patient taking differently: TAKE 1 TABLET (5 MG TOTAL) BY MOUTH 2 (TWO) TIMES DAILY.) 60 tablet 0  . baclofen (LIORESAL) 10 MG tablet TAKE 1-2 TABLETS (10-20 MG TOTAL) BY MOUTH 3 (THREE) TIMES DAILY AS NEEDED FOR MUSCLE SPASMS. 180 tablet 0  . docusate sodium (COLACE) 100 MG capsule Take 100-200 mg by mouth 2 (two) times daily as needed for mild constipation.    Marland Kitchen doxycycline (VIBRAMYCIN) 100 MG capsule Take 1 capsule (100 mg total) by mouth 2 (two) times daily. 20 capsule 0  . esomeprazole (NEXIUM) 40 MG capsule Take 40 mg by mouth daily as needed (for acid reflux).     . flecainide (TAMBOCOR) 100 MG tablet TAKE 1 TABLET BY MOUTH TWICE A DAY 60 tablet 3  . furosemide (LASIX) 40 MG tablet TAKE 1 TABLET (40 MG TOTAL) BY MOUTH DAILY. MUST SCHEDULE ANNUAL EXAM (Patient taking differently:  Take 40 mg by mouth daily as needed. MUST SCHEDULE ANNUAL EXAM) 30 tablet 0  . gabapentin (NEURONTIN) 100 MG capsule Take 1 capsule (100 mg total) by mouth daily as needed. (Patient taking differently: Take 100 mg by mouth daily as needed (for hand pain). ) 30 capsule 2  . HYDROcodone-acetaminophen (NORCO/VICODIN) 5-325 MG tablet Take 1 tablet by mouth every 8 (eight) hours. 10 tablet 0  . metoprolol succinate (TOPROL-XL) 25 MG 24 hr tablet Take 1 tablet (25 mg total) by mouth daily. Take with or immediately following a meal. 90 tablet 3  . traMADol (ULTRAM) 50 MG tablet Take 2 tablets (100 mg total) by mouth at bedtime as needed. 60 tablet 0   No current facility-administered medications for this visit.      Allergies  Allergen Reactions  . Penicillin G Other (See Comments)    From childhood; uncertain of reaction Has patient had a PCN reaction causing immediate rash, facial/tongue/throat swelling, SOB or lightheadedness with hypotension: Unk Has patient had a PCN reaction causing severe rash involving mucus membranes or skin necrosis: Unk Has patient had a PCN reaction that required hospitalization: Unk Has patient had a PCN reaction occurring within the last 10 years: No If all of the above answers are "NO", then may proceed with Cephalosporin use.  Marland Kitchen Penicillins Anaphylaxis and Other (See Comments)    From childhood; uncertain of reaction Has patient had a PCN reaction causing immediate rash, facial/tongue/throat swelling, SOB or lightheadedness with hypotension: Unk Has patient had a PCN reaction causing severe rash involving mucus membranes or skin necrosis: Unk Has patient had a PCN reaction that required hospitalization: Unk Has patient had a PCN reaction occurring within the last 10 years: No If all of the above answers are "NO", then may proceed with Cephalosporin use.  . Vancomycin Other (See Comments)    [place] Mans' syndrome    Family History  Problem Relation Age of Onset  . Coronary artery disease Mother   . Hyperlipidemia Mother   . Hypertension Mother   . Arthritis Mother   . Coronary artery disease Father   . Heart disease Paternal Grandmother   . Alzheimer's disease Paternal Grandfather     Social History   Socioeconomic History  . Marital status: Married    Spouse name: Not on file  . Number of children: Not on file  . Years of education: Not on file  . Highest education level: Not on file  Occupational History  . Occupation: Energy manager: [employer]  Social Needs  . Financial resource strain: Not on file  . Food insecurity:    Worry: Not on file    Inability: Not on file  . Transportation needs:    Medical: Not on file    Non-medical: Not on  file  Tobacco Use  . Smoking status: Never Smoker  . Smokeless tobacco: Never Used  Substance and Sexual Activity  . Alcohol use: Yes    Comment: occassional  . Drug use: No  . Sexual activity: Yes  Lifestyle  . Physical activity:    Days per week: Not on file    Minutes per session: Not on file  . Stress: Not on file  Relationships  . Social connections:    Talks on phone: Not on file    Gets together: Not on file    Attends religious service: Not on file    Active member of club or organization: Not on file  Attends meetings of clubs or organizations: Not on file    Relationship status: Not on file  . Intimate partner violence:    Fear of current or ex partner: Not on file    Emotionally abused: Not on file    Physically abused: Not on file    Forced sexual activity: Not on file  Other Topics Concern  . Not on file  Social History Narrative  . Not on file     Constitutional: Denies fever, malaise, fatigue, headache or abrupt weight changes.  HEENT: Denies eye pain, eye redness, ear pain, ringing in the ears, wax buildup, runny nose, nasal congestion, bloody nose, or sore throat. Respiratory: Pt reports cough, intermittent SOB. Denies difficulty breathing, or sputum production.   Cardiovascular: Denies che,t pain, chest tightness, palpitations or swelling in the hands or feet.    No other specific complaints in a complete review of systems (except as listed in HPI above).     Objective:   Physical Exam  BP 120/76   Pulse (!) 59   Temp 97.9 F (36.6 C) (Oral)   Wt 300 lb (136.1 kg)   SpO2 97%   BMI 44.30 kg/m  Wt Readings from Last 3 Encounters:  11/27/17 300 lb (136.1 kg)  11/25/17 (!) 300 lb 8 oz (136.3 kg)  11/21/17 300 lb (136.1 kg)    General: Appears his stated age, obese, in NAD. Skin: Warm, dry and intact. No bruising noted. Neck:  Neck supple, trachea midline. No masses, lumps or thyromegaly present.  Cardiovascular: Normal rate and rhythm.  S1,S2 noted.  No murmur, rubs or gallops noted.  Pulmonary/Chest: Normal effort and positive vesicular breath sounds. No respiratory distress. No wheezes, rales or ronchi noted.  Neurological: Alert and oriented.    BMET    Component Value Date/Time   NA 139 11/21/2017 1153   NA 141 10/31/2017 1438   K 4.0 11/21/2017 1153   CL 105 11/21/2017 1153   CO2 27 11/21/2017 1153   GLUCOSE 109 (H) 11/21/2017 1153   BUN 21 (H) 11/21/2017 1153   BUN 18 10/31/2017 1438   CREATININE 1.09 11/21/2017 1153   CREATININE 1.27 12/06/2015 1600   CALCIUM 9.0 11/21/2017 1153   GFRNONAA >60 11/21/2017 1153   GFRAA >60 11/21/2017 1153    Lipid Panel     Component Value Date/Time   CHOL 170 10/31/2017 1438   TRIG 130 10/31/2017 1438   HDL 36 (L) 10/31/2017 1438   CHOLHDL 4.7 10/31/2017 1438   CHOLHDL 4.1 03/29/2015 0434   VLDL 14 03/29/2015 0434   LDLCALC 108 (H) 10/31/2017 1438    CBC    Component Value Date/Time   WBC 8.3 11/21/2017 1153   RBC 5.28 11/21/2017 1153   HGB 14.2 11/21/2017 1153   HGB 14.0 10/31/2017 1438   HCT 45.2 11/21/2017 1153   HCT 43.5 10/31/2017 1438   PLT 189 11/21/2017 1153   PLT 220 10/31/2017 1438   MCV 85.6 11/21/2017 1153   MCV 83 10/31/2017 1438   MCH 26.9 11/21/2017 1153   MCHC 31.4 11/21/2017 1153   RDW 14.7 11/21/2017 1153   RDW 14.4 10/31/2017 1438   LYMPHSABS 2.0 11/21/2017 1153   LYMPHSABS 1.6 10/31/2017 1438   MONOABS 0.7 11/21/2017 1153   EOSABS 0.2 11/21/2017 1153   EOSABS 0.2 10/31/2017 1438   BASOSABS 0.1 11/21/2017 1153   BASOSABS 0.0 10/31/2017 1438    Hgb A1C Lab Results  Component Value Date   HGBA1C 6.0 11/05/2016  Assessment & Plan:   ER Follow Up for CAP, Enlarged Lymph Nodes:  ER notes, labs and imaging reviewed Continue Doxycycline as prescribed We will plan to repeat CT of the chest in 3 months to check for persistently enlarging lymph nodes If remain enlarged, will refer to pulmonology for further  evaluation  Return precautions discussed Webb Silversmith, NP

## 2017-11-27 NOTE — Telephone Encounter (Signed)
Pt was seen in ED labs repeated and liver enzymes normal

## 2017-11-28 ENCOUNTER — Encounter: Payer: Self-pay | Admitting: Internal Medicine

## 2017-11-28 NOTE — Patient Instructions (Signed)

## 2017-12-01 ENCOUNTER — Other Ambulatory Visit: Payer: Self-pay | Admitting: Internal Medicine

## 2017-12-07 ENCOUNTER — Other Ambulatory Visit: Payer: Self-pay | Admitting: Cardiovascular Disease

## 2017-12-07 DIAGNOSIS — R0789 Other chest pain: Secondary | ICD-10-CM

## 2017-12-07 DIAGNOSIS — Z0389 Encounter for observation for other suspected diseases and conditions ruled out: Secondary | ICD-10-CM

## 2017-12-07 DIAGNOSIS — I2699 Other pulmonary embolism without acute cor pulmonale: Secondary | ICD-10-CM

## 2017-12-07 DIAGNOSIS — I480 Paroxysmal atrial fibrillation: Secondary | ICD-10-CM

## 2017-12-07 DIAGNOSIS — I82492 Acute embolism and thrombosis of other specified deep vein of left lower extremity: Secondary | ICD-10-CM

## 2017-12-07 DIAGNOSIS — IMO0001 Reserved for inherently not codable concepts without codable children: Secondary | ICD-10-CM

## 2017-12-08 ENCOUNTER — Other Ambulatory Visit: Payer: Self-pay | Admitting: Cardiovascular Disease

## 2017-12-08 DIAGNOSIS — R0789 Other chest pain: Secondary | ICD-10-CM

## 2017-12-08 DIAGNOSIS — I82492 Acute embolism and thrombosis of other specified deep vein of left lower extremity: Secondary | ICD-10-CM

## 2017-12-08 DIAGNOSIS — IMO0001 Reserved for inherently not codable concepts without codable children: Secondary | ICD-10-CM

## 2017-12-08 DIAGNOSIS — I2699 Other pulmonary embolism without acute cor pulmonale: Secondary | ICD-10-CM

## 2017-12-08 DIAGNOSIS — Z0389 Encounter for observation for other suspected diseases and conditions ruled out: Secondary | ICD-10-CM

## 2017-12-08 DIAGNOSIS — I480 Paroxysmal atrial fibrillation: Secondary | ICD-10-CM

## 2017-12-22 DIAGNOSIS — M25512 Pain in left shoulder: Secondary | ICD-10-CM | POA: Diagnosis not present

## 2017-12-22 DIAGNOSIS — M6281 Muscle weakness (generalized): Secondary | ICD-10-CM | POA: Diagnosis not present

## 2017-12-22 DIAGNOSIS — M25612 Stiffness of left shoulder, not elsewhere classified: Secondary | ICD-10-CM | POA: Diagnosis not present

## 2017-12-24 ENCOUNTER — Other Ambulatory Visit: Payer: Self-pay | Admitting: Internal Medicine

## 2017-12-24 NOTE — Telephone Encounter (Signed)
Last filled 09/18/17... Please advise

## 2017-12-29 DIAGNOSIS — S161XXA Strain of muscle, fascia and tendon at neck level, initial encounter: Secondary | ICD-10-CM | POA: Diagnosis not present

## 2017-12-29 DIAGNOSIS — S233XXA Sprain of ligaments of thoracic spine, initial encounter: Secondary | ICD-10-CM | POA: Diagnosis not present

## 2017-12-29 DIAGNOSIS — S134XXA Sprain of ligaments of cervical spine, initial encounter: Secondary | ICD-10-CM | POA: Diagnosis not present

## 2017-12-29 DIAGNOSIS — S2341XA Sprain of ribs, initial encounter: Secondary | ICD-10-CM | POA: Diagnosis not present

## 2017-12-29 DIAGNOSIS — S29012A Strain of muscle and tendon of back wall of thorax, initial encounter: Secondary | ICD-10-CM | POA: Diagnosis not present

## 2017-12-29 DIAGNOSIS — S335XXA Sprain of ligaments of lumbar spine, initial encounter: Secondary | ICD-10-CM | POA: Diagnosis not present

## 2017-12-29 DIAGNOSIS — S39012A Strain of muscle, fascia and tendon of lower back, initial encounter: Secondary | ICD-10-CM | POA: Diagnosis not present

## 2017-12-29 DIAGNOSIS — S338XXA Sprain of other parts of lumbar spine and pelvis, initial encounter: Secondary | ICD-10-CM | POA: Diagnosis not present

## 2018-01-16 ENCOUNTER — Ambulatory Visit (INDEPENDENT_AMBULATORY_CARE_PROVIDER_SITE_OTHER): Payer: Managed Care, Other (non HMO) | Admitting: Internal Medicine

## 2018-01-16 ENCOUNTER — Encounter: Payer: Self-pay | Admitting: Internal Medicine

## 2018-01-16 ENCOUNTER — Ambulatory Visit (INDEPENDENT_AMBULATORY_CARE_PROVIDER_SITE_OTHER)
Admission: RE | Admit: 2018-01-16 | Discharge: 2018-01-16 | Disposition: A | Discharge status: Home / Self Care | Payer: Managed Care, Other (non HMO) | Source: Ambulatory Visit | Attending: Internal Medicine | Admitting: Internal Medicine

## 2018-01-16 VITALS — BP 122/76 | HR 78 | Temp 98.1°F | Wt 310.0 lb

## 2018-01-16 DIAGNOSIS — R202 Paresthesia of skin: Secondary | ICD-10-CM

## 2018-01-16 NOTE — Progress Notes (Signed)
Subjective:    Patient ID: Richard [name], male    DOB: Feb 09, 1960, 57 y.o.   MRN: [id number]  HPI  Pt presents to the clinic today to follow up bilateral hand pain, numbness, tingling and weakness. He reports this started around 06/2016. He was seen 10/16 and 11/30 for the same. He had his Vit D and B12 checked which were both low. He was started on Ergocalciferol and B12 injections. He never returned to repeat his B12 or Vit D levels. He was started on Neurontin 100 mg  PO daily and given Tramadol for severe pain. He has previously declined referral to neurology for EMG testing. He reports symptoms have worsened. He is starting to drop things due to loss of grip. He reports Neurontin and Tramadol are ineffective.  Review of Systems      Past Medical History:  Diagnosis Date  . Arthritis   . Arthrofibrosis of total knee arthroplasty ([place]) 05/30/2015  . Bilateral pulmonary embolism ([place])    a. 03/2015 CTA: acute bilat PE; b. IVC filter placed in 2017--> removed 2018.  Marland Kitchen Chest pain    a. 2015 Abnl MV;  b. 2015 Cath: nl cors.  . Chronic diastolic CHF (congestive heart failure) ([place])    a. 03/2015 Echo: EF 55-65%, poor windows, mildly dil RV.  Marland Kitchen DVT (deep venous thrombosis) ([place])    a. 03/2015 U/S: occlusive DVT w/in the distal aspect of the Left fem vein through the L popliteal vein.  Marland Kitchen GERD (gastroesophageal reflux disease)   . Headache   . Hypertensive heart disease   . Obesity   . OSA (obstructive sleep apnea)    uses CPAP nightly  . PAF (paroxysmal atrial fibrillation) ([place])   . Primary localized osteoarthritis of left knee    a. 11/2014 s/p L TKA.  . Primary localized osteoarthritis of right knee    a. 02/2014 s/p R Partial Knee arthroplasty.  . Stiffness of left knee    a. 12/2014 s/p manipulation under anesthesia.  . Urine incontinence     Current Outpatient Medications  Medication Sig Dispense Refill  . acetaminophen (TYLENOL) 650 MG CR tablet Take 1,300 mg by mouth every  8 (eight) hours as needed for pain.    Marland Kitchen apixaban (ELIQUIS) 5 MG TABS tablet TAKE 1 TABLET (5 MG TOTAL) BY MOUTH 2 (TWO) TIMES DAILY. Pt needs labs to assess kidney function prior to further refills. (Patient taking differently: TAKE 1 TABLET (5 MG TOTAL) BY MOUTH 2 (TWO) TIMES DAILY.) 60 tablet 0  . baclofen (LIORESAL) 10 MG tablet TAKE 1-2 TABLETS (10-20 MG TOTAL) BY MOUTH 3 (THREE) TIMES DAILY AS NEEDED FOR MUSCLE SPASMS. 180 tablet 0  . docusate sodium (COLACE) 100 MG capsule Take 100-200 mg by mouth 2 (two) times daily as needed for mild constipation.    Marland Kitchen doxycycline (VIBRAMYCIN) 100 MG capsule Take 1 capsule (100 mg total) by mouth 2 (two) times daily. 20 capsule 0  . esomeprazole (NEXIUM) 40 MG capsule Take 40 mg by mouth daily as needed (for acid reflux).     . flecainide (TAMBOCOR) 100 MG tablet TAKE 1 TABLET BY MOUTH TWICE A DAY 180 tablet 2  . flecainide (TAMBOCOR) 100 MG tablet TAKE ONE TABLET BY MOUTH TWICE A DAY 60 tablet 6  . furosemide (LASIX) 40 MG tablet Take 1 tablet (40 mg total) by mouth daily as needed. MUST SCHEDULE ANNUAL EXAM 30 tablet 0  . gabapentin (NEURONTIN) 100 MG capsule Take 1 capsule (100  mg total) by mouth daily as needed. (Patient taking differently: Take 100 mg by mouth daily as needed (for hand pain). ) 30 capsule 2  . HYDROcodone-acetaminophen (NORCO/VICODIN) 5-325 MG tablet Take 1 tablet by mouth every 8 (eight) hours. 10 tablet 0  . metoprolol succinate (TOPROL-XL) 25 MG 24 hr tablet Take 1 tablet (25 mg total) by mouth daily. Take with or immediately following a meal. 90 tablet 3  . traMADol (ULTRAM) 50 MG tablet TAKE 2 TABLETS (100 MG TOTAL) BY MOUTH AT BEDTIME AS NEEDED. 60 tablet 0   No current facility-administered medications for this visit.     Allergies  Allergen Reactions  . Penicillin G Other (See Comments)    From childhood; uncertain of reaction Has patient had a PCN reaction causing immediate rash, facial/tongue/throat swelling, SOB or  lightheadedness with hypotension: Unk Has patient had a PCN reaction causing severe rash involving mucus membranes or skin necrosis: Unk Has patient had a PCN reaction that required hospitalization: Unk Has patient had a PCN reaction occurring within the last 10 years: No If all of the above answers are "NO", then may proceed with Cephalosporin use.  Marland Kitchen Penicillins Anaphylaxis and Other (See Comments)    From childhood; uncertain of reaction Has patient had a PCN reaction causing immediate rash, facial/tongue/throat swelling, SOB or lightheadedness with hypotension: Unk Has patient had a PCN reaction causing severe rash involving mucus membranes or skin necrosis: Unk Has patient had a PCN reaction that required hospitalization: Unk Has patient had a PCN reaction occurring within the last 10 years: No If all of the above answers are "NO", then may proceed with Cephalosporin use.  . Vancomycin Other (See Comments)    Red Mans' syndrome    Family History  Problem Relation Age of Onset  . Coronary artery disease Mother   . Hyperlipidemia Mother   . Hypertension Mother   . Arthritis Mother   . Coronary artery disease Father   . Heart disease Paternal Grandmother   . Alzheimer's disease Paternal Grandfather     Social History   Socioeconomic History  . Marital status: Married    Spouse name: Not on file  . Number of children: Not on file  . Years of education: Not on file  . Highest education level: Not on file  Occupational History  . Occupation: Energy manager: [employer]  Social Needs  . Financial resource strain: Not on file  . Food insecurity:    Worry: Not on file    Inability: Not on file  . Transportation needs:    Medical: Not on file    Non-medical: Not on file  Tobacco Use  . Smoking status: Never Smoker  . Smokeless tobacco: Never Used  Substance and Sexual Activity  . Alcohol use: Yes    Comment: occassional  . Drug use: No  . Sexual activity: Yes    Lifestyle  . Physical activity:    Days per week: Not on file    Minutes per session: Not on file  . Stress: Not on file  Relationships  . Social connections:    Talks on phone: Not on file    Gets together: Not on file    Attends religious service: Not on file    Active member of club or organization: Not on file    Attends meetings of clubs or organizations: Not on file    Relationship status: Not on file  . Intimate partner violence:  Fear of current or ex partner: Not on file    Emotionally abused: Not on file    Physically abused: Not on file    Forced sexual activity: Not on file  Other Topics Concern  . Not on file  Social History Narrative  . Not on file     Constitutional: Denies fever, malaise, fatigue, headache or abrupt weight changes.  Musculoskeletal: Pt reports bilateral hand pain. Denies decrease in range of motion, difficulty with gait, muscle pain or joint swelling.  Skin: Denies redness, rashes, lesions or ulcercations.  Neurological: Pt reports numbness, tingling, weakness of BUE. Denies problems with coordination.    No other specific complaints in a complete review of systems (except as listed in HPI above).  Objective:   Physical Exam  BP 122/76   Pulse 78   Temp 98.1 F (36.7 C) (Oral)   Wt (!) 310 lb (140.6 kg)   SpO2 98%   BMI 45.78 kg/m   Wt Readings from Last 3 Encounters:  11/27/17 300 lb (136.1 kg)  11/25/17 (!) 300 lb 8 oz (136.3 kg)  11/21/17 300 lb (136.1 kg)    General: Appears his stated age, obese, in NAD. Cardiovascular: Radial pulses 2+ bilaterally. Pulmonary/Chest: Normal effort and positive vesicular breath sounds. No respiratory distress. No wheezes, rales or ronchi noted.  Musculoskeletal: Normal flexion, extension and rotation of the wrist. Normal flexion and extension of the fingers. No joint swelling noted. Hand grips equal. Neurological: Alert and oriented. Sensation intact to BUE. Negative Phalen's. Negative  Tinel's.   BMET    Component Value Date/Time   NA 139 11/21/2017 1153   NA 141 10/31/2017 1438   K 4.0 11/21/2017 1153   CL 105 11/21/2017 1153   CO2 27 11/21/2017 1153   GLUCOSE 109 (H) 11/21/2017 1153   BUN 21 (H) 11/21/2017 1153   BUN 18 10/31/2017 1438   CREATININE 1.09 11/21/2017 1153   CREATININE 1.27 12/06/2015 1600   CALCIUM 9.0 11/21/2017 1153   GFRNONAA >60 11/21/2017 1153   GFRAA >60 11/21/2017 1153    Lipid Panel     Component Value Date/Time   CHOL 170 10/31/2017 1438   TRIG 130 10/31/2017 1438   HDL 36 (L) 10/31/2017 1438   CHOLHDL 4.7 10/31/2017 1438   CHOLHDL 4.1 03/29/2015 0434   VLDL 14 03/29/2015 0434   LDLCALC 108 (H) 10/31/2017 1438    CBC    Component Value Date/Time   WBC 8.3 11/21/2017 1153   RBC 5.28 11/21/2017 1153   HGB 14.2 11/21/2017 1153   HGB 14.0 10/31/2017 1438   HCT 45.2 11/21/2017 1153   HCT 43.5 10/31/2017 1438   PLT 189 11/21/2017 1153   PLT 220 10/31/2017 1438   MCV 85.6 11/21/2017 1153   MCV 83 10/31/2017 1438   MCH 26.9 11/21/2017 1153   MCHC 31.4 11/21/2017 1153   RDW 14.7 11/21/2017 1153   RDW 14.4 10/31/2017 1438   LYMPHSABS 2.0 11/21/2017 1153   LYMPHSABS 1.6 10/31/2017 1438   MONOABS 0.7 11/21/2017 1153   EOSABS 0.2 11/21/2017 1153   EOSABS 0.2 10/31/2017 1438   BASOSABS 0.1 11/21/2017 1153   BASOSABS 0.0 10/31/2017 1438    Hgb A1C Lab Results  Component Value Date   HGBA1C 6.0 11/05/2016            Assessment & Plan:   Pain and Paresthesia of Bilateral Hands:  Repeat B12 and Vit D today Xray bilateral hands today Will likely need referral to neurology  for EMG testing pending xrays Continue Ultram prn  Will follow up after labs and xrays, return precautions discussed Webb Silversmith, NP

## 2018-01-16 NOTE — Patient Instructions (Signed)
Paresthesia Paresthesia is a burning or prickling feeling. This feeling can happen in any part of the body. It often happens in the hands, arms, legs, or feet. Usually, it is not painful. In most cases, the feeling goes away in a short time and is not a sign of a serious problem. If you have paresthesia that lasts a long time, you may need to be seen by your doctor. Follow these instructions at home: Alcohol use   Do not drink alcohol if: ? Your doctor tells you not to drink. ? You are pregnant, may be pregnant, or are planning to become pregnant.  If you drink alcohol, limit how much you have: ? 0-1 drink a day for women. ? 0-2 drinks a day for men.  Be aware of how much alcohol is in your drink. In the [place] one drink equals one typical bottle of beer (12 oz), one-half glass of wine (5 oz), or one shot of hard liquor (1 oz). Nutrition  Eat a healthy diet. This includes: ? Eating foods that have a lot of fiber in them, such as fresh fruits and vegetables, whole grains, and beans. ? Limiting foods that have a lot of fat and processed sugars in them, such as fried or sweet foods. General instructions  Take over-the-counter and prescription medicines only as told by your doctor.  Do not use any products that have nicotine or tobacco in them, such as cigarettes and e-cigarettes. If you need help quitting, ask your doctor.  If you have diabetes, work with your doctor to make sure your blood sugar stays in a healthy range.  If your feet feel numb: ? Check for redness, warmth, and swelling every day. ? Wear padded socks and comfortable shoes. These help protect your feet.  Keep all follow-up visits as told by your doctor. This is important. Contact a doctor if:  You have paresthesia that gets worse or does not go away.  Your burning or prickling feeling gets worse when you walk.  You have pain or cramps.  You feel dizzy.  You have a rash. Get help right away if you:  Feel  weak.  Have trouble walking or moving.  Have problems speaking, understanding, or seeing.  Feel confused.  Cannot control when you pee (urinate) or poop (have a bowel movement).  Lose feeling (have numbness) after an injury.  Have new weakness in an arm or leg.  Pass out (faint). Summary  Paresthesia is a burning or prickling feeling. It often happens in the hands, arms, legs, or feet.  In most cases, the feeling goes away in a short time and is not a sign of a serious problem.  If you have paresthesia that lasts a long time, you may need to be seen by your doctor. This information is not intended to replace advice given to you by your health care provider. Make sure you discuss any questions you have with your health care provider. Document Released: 12/21/2007 Document Revised: 01/16/2017 Document Reviewed: 01/16/2017 Elsevier Interactive Patient Education  2019 Elsevier Inc.  

## 2018-01-17 ENCOUNTER — Other Ambulatory Visit: Payer: Self-pay | Admitting: Internal Medicine

## 2018-01-17 LAB — VITAMIN D 25 HYDROXY (VIT D DEFICIENCY, FRACTURES): Vit D, 25-Hydroxy: 19 ng/mL — ABNORMAL LOW (ref 30–100)

## 2018-01-17 LAB — VITAMIN B12: Vitamin B-12: 262 pg/mL (ref 200–1100)

## 2018-01-17 MED ORDER — VITAMIN D (ERGOCALCIFEROL) 1.25 MG (50000 UNIT) PO CAPS: 50000.0000 [IU] | ORAL_CAPSULE | ORAL | 0 refills | Status: DC

## 2018-01-17 NOTE — Progress Notes (Signed)
ergoc

## 2018-01-26 ENCOUNTER — Ambulatory Visit (INDEPENDENT_AMBULATORY_CARE_PROVIDER_SITE_OTHER): Payer: No Typology Code available for payment source

## 2018-01-26 DIAGNOSIS — E538 Deficiency of other specified B group vitamins: Secondary | ICD-10-CM

## 2018-01-26 MED ORDER — CYANOCOBALAMIN 1000 MCG/ML IJ SOLN
1000.0000 ug | INTRAMUSCULAR | Status: DC
  Administered 2018-01-26 – 2018-04-08 (×2): 1000 ug via INTRAMUSCULAR

## 2018-01-26 NOTE — Progress Notes (Signed)
Per orders ofRegina Baity, NP-C, injection ofB12given by Soren Pigman Y. Patient tolerated injection well. 

## 2018-01-30 DIAGNOSIS — M531 Cervicobrachial syndrome: Secondary | ICD-10-CM | POA: Diagnosis not present

## 2018-01-30 DIAGNOSIS — M9902 Segmental and somatic dysfunction of thoracic region: Secondary | ICD-10-CM | POA: Diagnosis not present

## 2018-01-30 DIAGNOSIS — M9901 Segmental and somatic dysfunction of cervical region: Secondary | ICD-10-CM | POA: Diagnosis not present

## 2018-02-02 ENCOUNTER — Other Ambulatory Visit: Payer: Self-pay | Admitting: Cardiovascular Disease

## 2018-02-02 DIAGNOSIS — I10 Essential (primary) hypertension: Secondary | ICD-10-CM | POA: Diagnosis not present

## 2018-02-02 DIAGNOSIS — I4891 Unspecified atrial fibrillation: Secondary | ICD-10-CM | POA: Diagnosis not present

## 2018-02-02 DIAGNOSIS — K219 Gastro-esophageal reflux disease without esophagitis: Secondary | ICD-10-CM | POA: Diagnosis not present

## 2018-02-02 DIAGNOSIS — G4733 Obstructive sleep apnea (adult) (pediatric): Secondary | ICD-10-CM | POA: Diagnosis not present

## 2018-02-02 NOTE — Telephone Encounter (Signed)
Please review for refills, Thanks !  

## 2018-02-08 ENCOUNTER — Other Ambulatory Visit: Payer: Self-pay | Admitting: Internal Medicine

## 2018-02-10 ENCOUNTER — Other Ambulatory Visit: Payer: Self-pay | Admitting: Internal Medicine

## 2018-02-10 ENCOUNTER — Ambulatory Visit (INDEPENDENT_AMBULATORY_CARE_PROVIDER_SITE_OTHER): Payer: PPO

## 2018-02-10 DIAGNOSIS — R202 Paresthesia of skin: Secondary | ICD-10-CM

## 2018-02-10 DIAGNOSIS — E538 Deficiency of other specified B group vitamins: Secondary | ICD-10-CM

## 2018-02-10 MED ORDER — CYANOCOBALAMIN 1000 MCG/ML IJ SOLN
1000.0000 ug | INTRAMUSCULAR | Status: AC
  Administered 2018-02-10: 1000 ug via INTRAMUSCULAR

## 2018-02-10 NOTE — Progress Notes (Signed)
Per orders ofRegina Baity, NP-C, injection ofB12given by Ildefonso Keaney Y. Patient tolerated injection well. 

## 2018-02-11 DIAGNOSIS — M4722 Other spondylosis with radiculopathy, cervical region: Secondary | ICD-10-CM | POA: Diagnosis not present

## 2018-02-11 DIAGNOSIS — Z9889 Other specified postprocedural states: Secondary | ICD-10-CM | POA: Diagnosis not present

## 2018-02-16 ENCOUNTER — Telehealth: Payer: Self-pay | Admitting: Internal Medicine

## 2018-02-16 DIAGNOSIS — Z6841 Body Mass Index (BMI) 40.0 and over, adult: Secondary | ICD-10-CM | POA: Diagnosis not present

## 2018-02-16 DIAGNOSIS — R200 Anesthesia of skin: Secondary | ICD-10-CM | POA: Diagnosis not present

## 2018-02-16 NOTE — Telephone Encounter (Signed)
Called patient to schedule Neurology consult, his Ortho has referred him to Dr Wallene Huh at [facility] for possible Steroid injections, they think the  numbness  is  coming from his neck.Cancelling the Neurology referral.

## 2018-02-16 NOTE — Telephone Encounter (Signed)
noted 

## 2018-02-22 ENCOUNTER — Other Ambulatory Visit: Payer: Self-pay | Admitting: Cardiovascular Disease

## 2018-02-22 DIAGNOSIS — I2699 Other pulmonary embolism without acute cor pulmonale: Secondary | ICD-10-CM

## 2018-02-22 DIAGNOSIS — R0789 Other chest pain: Secondary | ICD-10-CM

## 2018-02-22 DIAGNOSIS — Z0389 Encounter for observation for other suspected diseases and conditions ruled out: Secondary | ICD-10-CM

## 2018-02-22 DIAGNOSIS — I82492 Acute embolism and thrombosis of other specified deep vein of left lower extremity: Secondary | ICD-10-CM

## 2018-02-22 DIAGNOSIS — IMO0001 Reserved for inherently not codable concepts without codable children: Secondary | ICD-10-CM

## 2018-02-22 DIAGNOSIS — I480 Paroxysmal atrial fibrillation: Secondary | ICD-10-CM

## 2018-02-24 ENCOUNTER — Ambulatory Visit (INDEPENDENT_AMBULATORY_CARE_PROVIDER_SITE_OTHER): Payer: PPO

## 2018-02-24 DIAGNOSIS — E538 Deficiency of other specified B group vitamins: Secondary | ICD-10-CM | POA: Diagnosis not present

## 2018-02-24 MED ORDER — CYANOCOBALAMIN 1000 MCG/ML IJ SOLN
1000.0000 ug | Freq: Once | INTRAMUSCULAR | Status: AC
  Administered 2018-02-24: 1000 ug via INTRAMUSCULAR

## 2018-02-24 NOTE — Progress Notes (Signed)
Per orders ofRegina Baity, NP-C, injection ofB12given by Kawena Lyday Y. Patient tolerated injection well. 

## 2018-03-09 ENCOUNTER — Other Ambulatory Visit: Payer: Self-pay | Admitting: *Deleted

## 2018-03-09 DIAGNOSIS — I480 Paroxysmal atrial fibrillation: Secondary | ICD-10-CM

## 2018-03-09 DIAGNOSIS — I2699 Other pulmonary embolism without acute cor pulmonale: Secondary | ICD-10-CM

## 2018-03-09 DIAGNOSIS — IMO0001 Reserved for inherently not codable concepts without codable children: Secondary | ICD-10-CM

## 2018-03-09 DIAGNOSIS — Z0389 Encounter for observation for other suspected diseases and conditions ruled out: Secondary | ICD-10-CM

## 2018-03-09 DIAGNOSIS — R0789 Other chest pain: Secondary | ICD-10-CM

## 2018-03-09 DIAGNOSIS — I82492 Acute embolism and thrombosis of other specified deep vein of left lower extremity: Secondary | ICD-10-CM

## 2018-03-09 MED ORDER — FLECAINIDE ACETATE 100 MG PO TABS: 100.0000 mg | ORAL_TABLET | Freq: Two times a day (BID) | ORAL | 3 refills | Status: DC

## 2018-03-09 NOTE — Telephone Encounter (Signed)
Patient requesting a ninety day rx.

## 2018-03-11 ENCOUNTER — Telehealth: Payer: Self-pay

## 2018-03-11 ENCOUNTER — Other Ambulatory Visit: Payer: Self-pay | Admitting: *Deleted

## 2018-03-11 ENCOUNTER — Ambulatory Visit (INDEPENDENT_AMBULATORY_CARE_PROVIDER_SITE_OTHER): Payer: PPO

## 2018-03-11 DIAGNOSIS — I480 Paroxysmal atrial fibrillation: Secondary | ICD-10-CM

## 2018-03-11 DIAGNOSIS — I82492 Acute embolism and thrombosis of other specified deep vein of left lower extremity: Secondary | ICD-10-CM

## 2018-03-11 DIAGNOSIS — E538 Deficiency of other specified B group vitamins: Secondary | ICD-10-CM

## 2018-03-11 DIAGNOSIS — I2699 Other pulmonary embolism without acute cor pulmonale: Secondary | ICD-10-CM

## 2018-03-11 DIAGNOSIS — Z0389 Encounter for observation for other suspected diseases and conditions ruled out: Secondary | ICD-10-CM

## 2018-03-11 DIAGNOSIS — R0789 Other chest pain: Secondary | ICD-10-CM

## 2018-03-11 DIAGNOSIS — IMO0001 Reserved for inherently not codable concepts without codable children: Secondary | ICD-10-CM

## 2018-03-11 MED ORDER — FLECAINIDE ACETATE 100 MG PO TABS: 100.0000 mg | ORAL_TABLET | Freq: Two times a day (BID) | ORAL | 2 refills | Status: DC

## 2018-03-11 MED ORDER — CYANOCOBALAMIN 1000 MCG/ML IJ SOLN
1000.0000 ug | Freq: Once | INTRAMUSCULAR | Status: AC
  Administered 2018-03-11: 1000 ug via INTRAMUSCULAR

## 2018-03-11 NOTE — Telephone Encounter (Signed)
Patient came today and for his B12 injection and stated that he has not noticed any difference so far with getting these injections. If anything his fatigue is worse. He is taking his Vitamin D every 7 days as advised. He is walking daily and goes to the [employer] 2 to 3 days a week. He states he also has not noticed his weight changing with working out and eating better. Patient was advised I would send this to [facility] for her advise on these issues. Patients next appointment is scheduled for April. Patient is aware that Webb Silversmith is out of the office today.

## 2018-03-11 NOTE — Progress Notes (Signed)
Per orders of Webb Silversmith, NP injection of B12 injection given by Kris Mouton. Patient tolerated injection well.  Co sign order sent to Alma Friendly, NP since Webb Silversmith, NP is not in the office.

## 2018-03-11 NOTE — Telephone Encounter (Signed)
Continue as is for now. Will recheck levels in March.

## 2018-03-16 DIAGNOSIS — G5601 Carpal tunnel syndrome, right upper limb: Secondary | ICD-10-CM | POA: Diagnosis not present

## 2018-03-18 NOTE — Telephone Encounter (Signed)
I spoke with patient and relayed the msg. He said that is fine but is concerned about the weight gain. He has gained about 13 pounds in about 6 weeks and is walking- just completed 4 miles today. He is worried since the scale is moving in wrong direction.

## 2018-03-18 NOTE — Telephone Encounter (Signed)
Will check TSH at annual exam. He can make appt to discuss weight gain prior to CPE if he wants.

## 2018-03-18 NOTE — Telephone Encounter (Signed)
Called pt, no answer and VM not set up... pt has CPE scheduled for April, we can recheck all labs at that [place]

## 2018-03-19 ENCOUNTER — Other Ambulatory Visit: Payer: Self-pay | Admitting: Internal Medicine

## 2018-03-19 NOTE — Telephone Encounter (Signed)
Last filled 12/24/2017... please advise

## 2018-03-22 HISTORY — PX: CARPAL TUNNEL RELEASE: SHX101

## 2018-03-23 DIAGNOSIS — M9902 Segmental and somatic dysfunction of thoracic region: Secondary | ICD-10-CM | POA: Diagnosis not present

## 2018-03-23 DIAGNOSIS — M9901 Segmental and somatic dysfunction of cervical region: Secondary | ICD-10-CM | POA: Diagnosis not present

## 2018-03-23 DIAGNOSIS — M531 Cervicobrachial syndrome: Secondary | ICD-10-CM | POA: Diagnosis not present

## 2018-03-25 ENCOUNTER — Telehealth: Payer: Self-pay | Admitting: Cardiovascular Disease

## 2018-03-25 ENCOUNTER — Ambulatory Visit (INDEPENDENT_AMBULATORY_CARE_PROVIDER_SITE_OTHER): Payer: No Typology Code available for payment source

## 2018-03-25 DIAGNOSIS — E538 Deficiency of other specified B group vitamins: Secondary | ICD-10-CM

## 2018-03-25 DIAGNOSIS — G5602 Carpal tunnel syndrome, left upper limb: Secondary | ICD-10-CM | POA: Diagnosis not present

## 2018-03-25 MED ORDER — CYANOCOBALAMIN 1000 MCG/ML IJ SOLN
1000.0000 ug | Freq: Once | INTRAMUSCULAR | Status: AC
  Administered 2018-03-25: 1000 ug via INTRAMUSCULAR

## 2018-03-25 NOTE — Telephone Encounter (Signed)
Pt is aware and will just wait for CPE appt

## 2018-03-25 NOTE — Telephone Encounter (Signed)
   Primary Cardiologist: Kathlyn [place], MD  Chart reviewed as part of pre-operative protocol coverage. Patient was contacted 03/25/2018 in reference to pre-operative risk assessment for pending surgery as outlined below.  Richard [name] was last seen on 11/25/17  by Dr. Fletcher Anon. .  Since that day, Richard [name] has done well. Goes [employer] 3 times/week and other days walk at mall.   Therefore, based on ACC/AHA guidelines, the patient would be at acceptable risk for the planned procedure without further cardiovascular testing.   Pharmacy to review anticoagulation.   [place], [place] 03/25/2018, 2:46 PM

## 2018-03-25 NOTE — Progress Notes (Signed)
Per orders ofRegina Baity, NP-C, injection ofB12given by Valor [place] Y. Patient tolerated injection well. 

## 2018-03-25 NOTE — Telephone Encounter (Signed)
Patient takes Eliquis for afib with CHADS2VASc score of 4 (CHF, HTN, VTE). He experienced a left lower extremity DVT complicated by massive bilateral PE in 2017. Renal function is normal. Recommend holding Eliquis for only 24 hours prior to procedure.

## 2018-03-25 NOTE — Telephone Encounter (Signed)
° °  [facility] Pre-operative Risk Assessment    Request for surgical clearance:  1. What type of surgery is being performed? Carpal Tunnell Release   2. When is this surgery scheduled?  03/31/18   3. What type of clearance is required (medical clearance vs. Pharmacy clearance to hold med vs. Both)?  Both  4. Are there any medications that need to be held prior to surgery and how long? Eliquis or any other blood thinners please advise on hold time   5. Practice name and name of physician performing surgery? Dr. Bretta Bang ortho   6. What is your office phone number [phone]   7.   What is your office fax number [phone]  8.   Anesthesia type (None, local, MAC, general) ?  MAC   Richard [name] 03/25/2018, 2:10 PM  _________________________________________________________________   (provider comments below)

## 2018-03-26 ENCOUNTER — Telehealth: Payer: Self-pay

## 2018-03-26 NOTE — Telephone Encounter (Signed)
PA has been submitted via [email or web address] to [employer] awaiting response

## 2018-03-30 DIAGNOSIS — G5602 Carpal tunnel syndrome, left upper limb: Secondary | ICD-10-CM | POA: Diagnosis not present

## 2018-04-03 DIAGNOSIS — G4733 Obstructive sleep apnea (adult) (pediatric): Secondary | ICD-10-CM | POA: Diagnosis not present

## 2018-04-07 DIAGNOSIS — M25511 Pain in right shoulder: Secondary | ICD-10-CM | POA: Diagnosis not present

## 2018-04-07 DIAGNOSIS — M531 Cervicobrachial syndrome: Secondary | ICD-10-CM | POA: Diagnosis not present

## 2018-04-07 DIAGNOSIS — M9902 Segmental and somatic dysfunction of thoracic region: Secondary | ICD-10-CM | POA: Diagnosis not present

## 2018-04-07 DIAGNOSIS — M9901 Segmental and somatic dysfunction of cervical region: Secondary | ICD-10-CM | POA: Diagnosis not present

## 2018-04-08 ENCOUNTER — Ambulatory Visit (INDEPENDENT_AMBULATORY_CARE_PROVIDER_SITE_OTHER): Payer: No Typology Code available for payment source

## 2018-04-08 ENCOUNTER — Other Ambulatory Visit: Payer: Self-pay

## 2018-04-08 DIAGNOSIS — Z9889 Other specified postprocedural states: Secondary | ICD-10-CM | POA: Diagnosis not present

## 2018-04-08 DIAGNOSIS — E538 Deficiency of other specified B group vitamins: Secondary | ICD-10-CM | POA: Diagnosis not present

## 2018-04-08 NOTE — Progress Notes (Signed)
Per orders ofRegina Baity, NP-C, injection ofB12given by Pardeep Pautz Y. Patient tolerated injection well. 

## 2018-04-13 ENCOUNTER — Other Ambulatory Visit: Payer: Self-pay | Admitting: Internal Medicine

## 2018-04-14 ENCOUNTER — Other Ambulatory Visit: Payer: Self-pay | Admitting: Internal Medicine

## 2018-04-14 NOTE — Telephone Encounter (Signed)
Tramadol last filled 03/20/2018, note to pharmacy [facility]F on or after 04/18/2018... please advise

## 2018-04-17 ENCOUNTER — Other Ambulatory Visit: Payer: Self-pay | Admitting: Internal Medicine

## 2018-04-17 NOTE — Telephone Encounter (Signed)
Electronic refill request. Vitamin D 50,000 iu Last office visit:   01/16/2018 Last Filled:    12 capsule 0 01/17/2018  Does he still need to continue?  Please advise.

## 2018-04-20 ENCOUNTER — Telehealth: Payer: Self-pay

## 2018-04-20 NOTE — Telephone Encounter (Signed)
Left message on voicemail for pt to r/s CPE

## 2018-04-23 ENCOUNTER — Other Ambulatory Visit: Payer: Self-pay

## 2018-04-23 ENCOUNTER — Ambulatory Visit (INDEPENDENT_AMBULATORY_CARE_PROVIDER_SITE_OTHER): Payer: PPO

## 2018-04-23 ENCOUNTER — Telehealth: Payer: Self-pay

## 2018-04-23 ENCOUNTER — Encounter: Payer: PPO | Admitting: Internal Medicine

## 2018-04-23 ENCOUNTER — Other Ambulatory Visit: Payer: Self-pay | Admitting: Internal Medicine

## 2018-04-23 ENCOUNTER — Ambulatory Visit: Payer: PPO

## 2018-04-23 DIAGNOSIS — D124 Benign neoplasm of descending colon: Secondary | ICD-10-CM | POA: Diagnosis not present

## 2018-04-23 DIAGNOSIS — E538 Deficiency of other specified B group vitamins: Secondary | ICD-10-CM | POA: Diagnosis not present

## 2018-04-23 DIAGNOSIS — Z86010 Personal history of colonic polyps: Secondary | ICD-10-CM | POA: Diagnosis not present

## 2018-04-23 DIAGNOSIS — K635 Polyp of colon: Secondary | ICD-10-CM | POA: Diagnosis not present

## 2018-04-23 DIAGNOSIS — D128 Benign neoplasm of rectum: Secondary | ICD-10-CM | POA: Diagnosis not present

## 2018-04-23 MED ORDER — CYANOCOBALAMIN 1000 MCG/ML IJ SOLN
1000.0000 ug | Freq: Once | INTRAMUSCULAR | Status: AC
  Administered 2018-04-23: 1000 ug via INTRAMUSCULAR

## 2018-04-23 NOTE — Telephone Encounter (Signed)
Have him due doxy.me to follow up chronic conditions. Will need repeat Vit D labs, which I can order during visit and he can make a lab only appt.

## 2018-04-23 NOTE — Telephone Encounter (Signed)
Received inbound call from patient regarding Vitamin D prescription. Advised patient that prescription was written for 12 capsules.   Patient would like to know if he needs another prescription for Vitamin D.

## 2018-04-23 NOTE — Progress Notes (Signed)
Pt received B12 in Left Deltoid. Tolerated well.

## 2018-05-04 DIAGNOSIS — G4733 Obstructive sleep apnea (adult) (pediatric): Secondary | ICD-10-CM | POA: Diagnosis not present

## 2018-05-05 DIAGNOSIS — H5789 Other specified disorders of eye and adnexa: Secondary | ICD-10-CM | POA: Diagnosis not present

## 2018-05-05 DIAGNOSIS — E782 Mixed hyperlipidemia: Secondary | ICD-10-CM | POA: Diagnosis not present

## 2018-05-05 DIAGNOSIS — Z6834 Body mass index (BMI) 34.0-34.9, adult: Secondary | ICD-10-CM | POA: Diagnosis not present

## 2018-05-05 DIAGNOSIS — R7301 Impaired fasting glucose: Secondary | ICD-10-CM | POA: Diagnosis not present

## 2018-05-18 DIAGNOSIS — G5601 Carpal tunnel syndrome, right upper limb: Secondary | ICD-10-CM | POA: Diagnosis not present

## 2018-06-03 DIAGNOSIS — G4733 Obstructive sleep apnea (adult) (pediatric): Secondary | ICD-10-CM | POA: Diagnosis not present

## 2018-06-09 ENCOUNTER — Emergency Department: Payer: PPO

## 2018-06-09 ENCOUNTER — Emergency Department
Admission: EM | Admit: 2018-06-09 | Discharge: 2018-06-10 | Disposition: A | Discharge status: Home / Self Care | Payer: PPO | Attending: Emergency Medicine | Admitting: Emergency Medicine

## 2018-06-09 ENCOUNTER — Encounter: Payer: Self-pay | Admitting: Emergency Medicine

## 2018-06-09 ENCOUNTER — Other Ambulatory Visit: Payer: Self-pay

## 2018-06-09 ENCOUNTER — Ambulatory Visit: Payer: Self-pay

## 2018-06-09 DIAGNOSIS — R0789 Other chest pain: Secondary | ICD-10-CM | POA: Diagnosis not present

## 2018-06-09 DIAGNOSIS — Z7901 Long term (current) use of anticoagulants: Secondary | ICD-10-CM | POA: Insufficient documentation

## 2018-06-09 DIAGNOSIS — R0602 Shortness of breath: Secondary | ICD-10-CM | POA: Diagnosis not present

## 2018-06-09 DIAGNOSIS — R079 Chest pain, unspecified: Secondary | ICD-10-CM | POA: Diagnosis not present

## 2018-06-09 DIAGNOSIS — I509 Heart failure, unspecified: Secondary | ICD-10-CM | POA: Insufficient documentation

## 2018-06-09 DIAGNOSIS — I110 Hypertensive heart disease with heart failure: Secondary | ICD-10-CM | POA: Diagnosis not present

## 2018-06-09 LAB — APTT: aPTT: 32 seconds (ref 24–36)

## 2018-06-09 LAB — COMPREHENSIVE METABOLIC PANEL
ALT: 37 U/L (ref 0–44)
AST: 30 U/L (ref 15–41)
Albumin: 3.9 g/dL (ref 3.5–5.0)
Alkaline Phosphatase: 84 U/L (ref 38–126)
Anion gap: 8 (ref 5–15)
BUN: 20 mg/dL (ref 6–20)
CO2: 25 mmol/L (ref 22–32)
Calcium: 8.6 mg/dL — ABNORMAL LOW (ref 8.9–10.3)
Chloride: 106 mmol/L (ref 98–111)
Creatinine, Ser: 1.42 mg/dL — ABNORMAL HIGH (ref 0.61–1.24)
GFR calc Af Amer: >60 mL/min (ref >60)
GFR calc non Af Amer: 54 mL/min — ABNORMAL LOW (ref >60)
Glucose, Bld: 97 mg/dL (ref 70–99)
Potassium: 4 mmol/L (ref 3.5–5.1)
Sodium: 139 mmol/L (ref 135–145)
Total Bilirubin: 0.7 mg/dL (ref 0.3–1.2)
Total Protein: 7.2 g/dL (ref 6.5–8.1)

## 2018-06-09 LAB — CBC WITH DIFFERENTIAL/PLATELET
Abs Immature Granulocytes: 0.04 10*3/uL (ref 0.00–0.07)
Basophils Absolute: 0 10*3/uL (ref 0.0–0.1)
Basophils Relative: 0 %
Eosinophils Absolute: 0.3 10*3/uL (ref 0.0–0.5)
Eosinophils Relative: 3 %
HCT: 44 % (ref 39.0–52.0)
Hemoglobin: 14 g/dL (ref 13.0–17.0)
Immature Granulocytes: 0 %
Lymphocytes Relative: 24 %
Lymphs Abs: 2.3 10*3/uL (ref 0.7–4.0)
MCH: 27.9 pg (ref 26.0–34.0)
MCHC: 31.8 g/dL (ref 30.0–36.0)
MCV: 87.6 fL (ref 80.0–100.0)
Monocytes Absolute: 0.8 10*3/uL (ref 0.1–1.0)
Monocytes Relative: 8 %
Neutro Abs: 6.2 10*3/uL (ref 1.7–7.7)
Neutrophils Relative %: 65 %
Platelets: 189 10*3/uL (ref 150–400)
RBC: 5.02 MIL/uL (ref 4.22–5.81)
RDW: 14.1 % (ref 11.5–15.5)
WBC: 9.6 10*3/uL (ref 4.0–10.5)
nRBC: 0 % (ref 0.0–0.2)

## 2018-06-09 LAB — PROTIME-INR
INR: 1.1 (ref 0.8–1.2)
Prothrombin Time: 14.5 seconds (ref 11.4–15.2)

## 2018-06-09 LAB — TROPONIN I: Troponin I: <0.03 ng/mL (ref <0.03)

## 2018-06-09 MED ORDER — HYDROCODONE-ACETAMINOPHEN 5-325 MG PO TABS: 1.0000 | ORAL_TABLET | Freq: Four times a day (QID) | ORAL | 0 refills | Status: DC | PRN

## 2018-06-09 MED ORDER — IOPAMIDOL (ISOVUE-370) INJECTION 76%
100.0000 mL | Freq: Once | INTRAVENOUS | Status: AC | PRN
  Administered 2018-06-09: 100 mL via INTRAVENOUS

## 2018-06-09 NOTE — ED Provider Notes (Signed)
[facility] Emergency Department Provider Note   ____________________________________________   First MD Initiated Contact with Patient 06/09/18 2116     (approximate)  I have reviewed the triage vital signs and the nursing notes.   HISTORY  Chief Complaint Chest Pain    HPI Richard [name] is a 58 y.o. male patient complains of left upper chest pain radiating toward the shoulder.  Please note it is left-sided.  Is worse with deep breathing.  Started a month ago with getting worse.  He is having more shortness of breath.  He is on Eliquis.  He has had a blood clot in the past after having a knee replacement.  He reports the pain is exactly the same as what he had with his blood clot but perhaps not as intense.  He also has some chest pain over the breastbone on the right side with palpation.  Palpation there reproduces the pain he is having.         Past Medical History:  Diagnosis Date   Arthritis    Arthrofibrosis of total knee arthroplasty ([place]) 05/30/2015   Bilateral pulmonary embolism ([place])    a. 03/2015 CTA: acute bilat PE; b. IVC filter placed in 2017--> removed 2018.   Chest pain    a. 2015 Abnl MV;  b. 2015 Cath: nl cors.   Chronic diastolic CHF (congestive heart failure) ([place])    a. 03/2015 Echo: EF 55-65%, poor windows, mildly dil RV.   DVT (deep venous thrombosis) ([place])    a. 03/2015 U/S: occlusive DVT w/in the distal aspect of the Left fem vein through the L popliteal vein.   GERD (gastroesophageal reflux disease)    Headache    Hypertensive heart disease    Obesity    OSA (obstructive sleep apnea)    uses CPAP nightly   PAF (paroxysmal atrial fibrillation) ([place])    Primary localized osteoarthritis of left knee    a. 11/2014 s/p L TKA.   Primary localized osteoarthritis of right knee    a. 02/2014 s/p R Partial Knee arthroplasty.   Stiffness of left knee    a. 12/2014 s/p manipulation under anesthesia.   Urine  incontinence     Patient Active Problem List   Diagnosis Date Noted   Arthrofibrosis of total knee arthroplasty ([place]) 05/30/2015   PAF (paroxysmal atrial fibrillation) ([place]) 03/30/2015   Peroneal DVT (deep venous thrombosis)    Hypertension 08/17/2011   LVH (left ventricular hypertrophy) 08/17/2011   GERD (gastroesophageal reflux disease) 08/17/2011   OSA (obstructive sleep apnea) 08/16/2011    Past Surgical History:  Procedure Laterality Date   APPENDECTOMY     CARDIAC CATHETERIZATION     EXAM UNDER ANESTHESIA WITH MANIPULATION OF KNEE Left 05/30/2015   Procedure: EXAM UNDER ANESTHESIA WITH MANIPULATION OF LEFT KNEE;  Surgeon: Marchia Bond, MD;  Location: [place];  Service: Orthopedics;  Laterality: Left;   I&D KNEE WITH POLY EXCHANGE Left 10/30/2015   Procedure: IRRIGATION AND DEBRIDEMENT KNEE WITH POLY EXCHANGE PLACE SPACERS;  Surgeon: Frederik Pear, MD;  Location: [place];  Service: Orthopedics;  Laterality: Left;  OFF ELIQUIST 3 DAYS   IVC FILTER PLACEMENT ([facility] HX)  04/13/15   IVC FILTER REMOVAL N/A 08/13/2016   Procedure: IVC Filter Removal;  Surgeon: Katha Cabal, MD;  Location: [place] CV LAB;  Service: Cardiovascular;  Laterality: N/A;   JOINT REPLACEMENT     KNEE ARTHROSCOPY Left 08/08/2015   Procedure: LEFT KNEE MANIPULATION WITH ARTHROSCOPIC  LYSIS OF ADHESIONS AND ASPIRATION;  Surgeon: Marchia Bond, MD;  Location: [place];  Service: Orthopedics;  Laterality: Left;   KNEE CLOSED REDUCTION Left 01/20/2015   Procedure: LEFT KNEE MANIPULATION;  Surgeon: Marchia Bond, MD;  Location: [place];  Service: Orthopedics;  Laterality: Left;   LEFT HEART CATHETERIZATION WITH CORONARY ANGIOGRAM N/A 01/12/2014   Procedure: LEFT HEART CATHETERIZATION WITH CORONARY ANGIOGRAM;  Surgeon: Jettie Booze, MD;  Location: [facility] CATH LAB;  Service: Cardiovascular;  Laterality: N/A;   ORTHOPEDIC SURGERY Left    arthroscopy x3   PARTIAL KNEE ARTHROPLASTY  Right 02/25/2014   Procedure: RIGHT KNEE ARTHROPLASTY CONDYLE AND PLATEAU MEDIAL COMPARTMENT ;  Surgeon: Johnny Bridge, MD;  Location: [place];  Service: Orthopedics;  Laterality: Right;   PERIPHERAL VASCULAR CATHETERIZATION N/A 03/29/2015   Procedure: IVC Filter Insertion, and possible thrombectomy;  Surgeon: Katha Cabal, MD;  Location: [place] CV LAB;  Service: Cardiovascular;  Laterality: N/A;   ROTATOR CUFF REPAIR Left    TOTAL KNEE ARTHROPLASTY  12/06/2014   Procedure: TOTAL KNEE ARTHROPLASTY;  Surgeon: Marchia Bond, MD;  Location: [name];  Service: Orthopedics;;   TOTAL KNEE REVISION Left 02/08/2016   Procedure: TOTAL KNEE REVISION;  Surgeon: Frederik Pear, MD;  Location: [place];  Service: Orthopedics;  Laterality: Left;    Prior to Admission medications   Medication Sig Start Date End Date Taking? Authorizing Provider  acetaminophen (TYLENOL) 500 MG tablet Take 1,000 mg by mouth every 6 (six) hours as needed for moderate pain.   Yes [provider]  apixaban (ELIQUIS) 5 MG TABS tablet TAKE 1 TABLET (5 MG TOTAL) BY MOUTH 2 (TWO) TIMES DAILY. 02/02/18  Yes Wellington Hampshire, MD  baclofen (LIORESAL) 10 MG tablet TAKE 1-2 TABLETS (10-20 MG TOTAL) BY MOUTH 3 (THREE) TIMES DAILY AS NEEDED FOR MUSCLE SPASMS. 05/06/16  Yes Jearld [place], NP  flecainide (TAMBOCOR) 100 MG tablet Take 1 tablet (100 mg total) by mouth 2 (two) times daily. 03/11/18  Yes Wellington Hampshire, MD  furosemide (LASIX) 40 MG tablet TAKE 1 TABLET (40 MG TOTAL) BY MOUTH DAILY AS NEEDED. MUST SCHEDULE ANNUAL EXAM 02/10/18  Yes Jearld Fenton, NP  gabapentin (NEURONTIN) 100 MG capsule Take 1 capsule (100 mg total) by mouth daily as needed. Patient taking differently: Take 100 mg by mouth daily as needed (for hand pain).  12/20/16  Yes Jearld Fenton, NP  metoprolol succinate (TOPROL-XL) 25 MG 24 hr tablet Take 1 tablet (25 mg total) by mouth daily. Take with or immediately following a meal.  11/25/17 06/09/18 Yes Wellington Hampshire, MD  Sennosides-Docusate Sodium (SM STOOL SOFTENER/LAXATIVE PO) Take 1 tablet by mouth at bedtime.   Yes [provider]  traMADol (ULTRAM) 50 MG tablet TAKE 2 TABLETS BY MOUTH EVERY NIGHT AT BEDTIME AS NEEDED 04/14/18  Yes 50, Coralie Keens, NP  Vitamin D, Ergocalciferol, (DRISDOL) 1.25 MG (50000 UT) CAPS capsule Take 1 capsule (50,000 Units total) by mouth every 7 (seven) days. 01/17/18  Yes 35, Coralie Keens, NP    Allergies Penicillin g; Penicillins; and Vancomycin  Family History  Problem Relation Age of Onset   Coronary artery disease Mother    Hyperlipidemia Mother    Hypertension Mother    Arthritis Mother    Coronary artery disease Father    Heart disease Paternal Grandmother    Alzheimer's disease Paternal Grandfather     Social History Social History   Tobacco Use  Smoking status: Never Smoker   Smokeless tobacco: Never Used  Substance Use Topics   Alcohol use: Yes    Comment: occassional   Drug use: No    View of systems: Constitutional: No fever/chills Eyes: No visual changes. ENT: No sore throat. Cardiovascular:  chest pain. Respiratory:  shortness of breath. Gastrointestinal: No abdominal pain.  No nausea, no vomiting.  No diarrhea.  No constipation. Genitourinary: Negative for dysuria. Musculoskeletal: Negative for back pain. Skin: Negative for rash. Neurological: Negative for headaches, focal weakness   ____________________________________________   PHYSICAL EXAM:  VITAL SIGNS: ED Triage Vitals  Enc Vitals Group     BP 06/09/18 2110 (!) 145/61     Pulse Rate 06/09/18 2110 62     Resp 06/09/18 2110 18     Temp 06/09/18 2110 98.4 F (36.9 C)     Temp Source 06/09/18 2110 Oral     SpO2 06/09/18 2110 96 %     Weight 06/09/18 2100 (!) 315 lb (142.9 kg)     Height 06/09/18 2100 5\' 9"  (1.753 m)     Head Circumference --      Peak Flow --      Pain Score 06/09/18 2100 8     Pain Loc --       Pain Edu? --      Excl. in [place]? --     Constitutional: Alert and oriented.  Appears slightly short of breath. Eyes: Conjunctivae are normal.  Head: Atraumatic. Nose: No congestion/rhinnorhea. Mouth/Throat: Mucous membranes are moist.  Oropharynx non-erythematous. Neck: No stridor. Cardiovascular: Normal rate, regular rhythm. Grossly normal heart sounds.  Good peripheral circulation.  Palpation of the left side of the breastbone reproduces the patient's pain Respiratory: Normal respiratory effort.  No retractions. Lungs CTAB. Gastrointestinal: Soft and nontender. No distention. No abdominal bruits.  Musculoskeletal: No lower extremity tenderness trace edema in the left leg.  This is a left over from his knee surgery and blood clot. Neurologic:  Normal speech and language. No gross focal neurologic deficits are appreciated.  Skin:  Skin is warm, dry and intact. No rash noted.   ____________________________________________   LABS (all labs ordered are listed, but only abnormal results are displayed)  Labs Reviewed  COMPREHENSIVE METABOLIC PANEL - Abnormal; Notable for the following components:      Result Value   Creatinine, Ser 1.42 (*)    Calcium 8.6 (*)    GFR calc non Af Amer 54 (*)    All other components within normal limits  CBC WITH DIFFERENTIAL/PLATELET  TROPONIN I  PROTIME-INR  APTT   ____________________________________________  EKG  EKG read interpreted by me shows sinus bradycardia at a rate of 55 left axis no acute ST-T changes ____________________________________________  RADIOLOGY  ED MD interpretation:    Official radiology report(s): Dg Chest 2 View  Result Date: 06/09/2018 CLINICAL DATA:  Chest pain and shortness of breath EXAM: CHEST - 2 VIEW COMPARISON:  November 21, 2017 chest radiograph and chest CT FINDINGS: There is no evident edema or consolidation. Heart size and pulmonary vascularity are normal. No adenopathy. There is degenerative change in the  thoracic spine. No pneumothorax. IMPRESSION: No edema or consolidation. Electronically Signed   By: Lowella Grip III M.D.   On: 06/09/2018 21:44   Ct Angio Chest Pe W And/or Wo Contrast  Result Date: 06/09/2018 CLINICAL DATA:  58 y/o M; upper chest pain radiating into the right shoulder increased with deep breathing with shortness of breath. History of PE.  EXAM: CT ANGIOGRAPHY CHEST WITH CONTRAST TECHNIQUE: Multidetector CT imaging of the chest was performed using the standard protocol during bolus administration of intravenous contrast. Multiplanar CT image reconstructions and MIPs were obtained to evaluate the vascular anatomy. CONTRAST:  141mL ISOVUE-370 IOPAMIDOL (ISOVUE-370) INJECTION 76% COMPARISON:  11/21/2017 and 02/25/2017 no consolidation, effusion, or pneumothorax. CTA of the chest. FINDINGS: Cardiovascular: Mild motion artifact. Satisfactory opacification of the pulmonary arteries to the segmental level. No evidence of pulmonary embolism. Normal heart size. No pericardial effusion. Mediastinum/Nodes: Stable mild nonspecific enlargement of mediastinal lymph nodes with the largest subcarinal lymph node measuring 16 mm short axis (series 4, image 44). No axillary adenopathy. Thyroid gland, trachea, and esophagus demonstrate no significant findings. Lungs/Pleura: Stable 5 mm nodule along the right major fissure (series 6, image 42) compatible benign etiology. Upper Abdomen: Partially visualized is mild enlargement of the spleen. Musculoskeletal: Gynecomastia. No acute or significant osseous finding. Review of the MIP images confirms the above findings. IMPRESSION: 1. Mild motion artifact. No pulmonary embolus identified. 2. Clear lungs. 3. Stable mild nonspecific enlargement of mediastinal lymph nodes in the spleen of uncertain etiology. Electronically Signed   By: Kristine Garbe M.D.   On: 06/09/2018 23:45    ____________________________________________   PROCEDURES  Procedure(s)  performed (including Critical Care):  Procedures   ____________________________________________   INITIAL IMPRESSION / ASSESSMENT AND PLAN / ED COURSE  CT angio is negative.  We will get a second troponin on this gentleman assuming it is negative I will discharge him.  His presumptive diagnosis is costochondritis.  I will give him a small amount of pain medicine to help with this.  I do not want to give him nonsteroidals as he is already on blood thinners.              ____________________________________________   FINAL CLINICAL IMPRESSION(S) / ED DIAGNOSES  Final diagnoses:  Chest pain, unspecified type     ED Discharge Orders    None       Note:  This document was prepared using Dragon voice recognition software and may include unintentional dictation errors.    Nena Polio, MD 06/09/18 [phone]

## 2018-06-09 NOTE — ED Notes (Signed)
EDP at bedside at this time.  

## 2018-06-09 NOTE — Discharge Instructions (Addendum)
I think you are having costochondritis or inflammation where the ribs and the breastbone joint.  Normally I would use Motrin for this but you are on the blood thinner and so you should not take Motrin.  I will give you a small amount of pain medication.  Please follow-up with your regular doctor in the next 2 t days to make sure you are improving and to make sure nothing else has turned up.  The chest CT did not show a blood clot or any other kind of lung problem.

## 2018-06-09 NOTE — ED Triage Notes (Signed)
Patient ambulatory to triage with steady gait, without difficulty or distress noted, mask in place; pt reports upper CP radiating into rt shoulder, increasing with deep breathing x month with some SHOB; st hx PE, currently taking Eloquist

## 2018-06-09 NOTE — Telephone Encounter (Signed)
  Pt c/o 2 month h/o chest pain. Pt stated that he knows it not his heart. Pt stated that he has a h/o pulmonary embolism and he thinks that is why his chest hurts. On inhalation pt describes a severe sharp pain. Pt stated that pain is not as bad when he is not taking a deep breath in but is always present. Pt denies SOB,nausea, vomiting, sweating, fever or cough. Pt c/o right shoulder pain.  Pt also c/o of a palpable knot in the center of his chest.  Care advice given and pt verbalized understanding. Pt going to [facility] ED- wife will be dropping him off. Pt informed no one can accompany him in the ED. Pt verbalized understanding.   Reason for Disposition . Taking a deep breath makes pain worse  Answer Assessment - Initial Assessment Questions 1. LOCATION: "Where does it hurt?"       Hurts with deep breath-constant knot in center of chest 2. RADIATION: "Does the pain go anywhere else?" (e.g., into neck, jaw, arms, back)      3. ONSET: "When did the chest pain begin?" (Minutes, hours or days)      2 months ago 4. PATTERN "Does the pain come and go, or has it been constant since it started?"  "Does it get worse with exertion?"      constant 5. DURATION: "How long does it last" (e.g., seconds, minutes, hours)     constant 6. SEVERITY: "How bad is the pain?"  (e.g., Scale 1-10; mild, moderate, or severe)    - MILD (1-3): doesn't interfere with normal activities     - MODERATE (4-7): interferes with normal activities or awakens from sleep    - SEVERE (8-10): excruciating pain, unable to do any normal activities       Sharp severe with deep breath 7. CARDIAC RISK FACTORS: "Do you have any history of heart problems or risk factors for heart disease?" (e.g., prior heart attack, angina; high blood pressure, diabetes, being overweight, high cholesterol, smoking, or strong family history of heart disease)    Overweight atrial fibrillation, hypertension, mom with stents 8. PULMONARY RISK FACTORS: "Do you  have any history of lung disease?"  (e.g., blood clots in lung, asthma, emphysema, birth control pills)     Blood clots in lungs 2018 9. CAUSE: "What do you think is causing the chest pain?"     PE 10. OTHER SYMPTOMS: "Do you have any other symptoms?" (e.g., dizziness, nausea, vomiting, sweating, fever, difficulty breathing, cough)      Right shoulder pain 11. PREGNANCY: "Is there any chance you are pregnant?" "When was your last menstrual period?"       n/a  Protocols used: CHEST PAIN-A-AH

## 2018-06-09 NOTE — ED Notes (Signed)
Patient returned from CT

## 2018-06-09 NOTE — ED Notes (Signed)
Patient transported to X-ray 

## 2018-06-09 NOTE — ED Notes (Signed)
Pt returned from Xray at this time  

## 2018-06-10 ENCOUNTER — Ambulatory Visit (INDEPENDENT_AMBULATORY_CARE_PROVIDER_SITE_OTHER): Payer: PPO | Admitting: Internal Medicine

## 2018-06-10 ENCOUNTER — Encounter: Payer: Self-pay | Admitting: Internal Medicine

## 2018-06-10 DIAGNOSIS — M940 Chondrocostal junction syndrome [Tietze]: Secondary | ICD-10-CM | POA: Diagnosis not present

## 2018-06-10 DIAGNOSIS — R0789 Other chest pain: Secondary | ICD-10-CM | POA: Diagnosis not present

## 2018-06-10 DIAGNOSIS — R079 Chest pain, unspecified: Secondary | ICD-10-CM | POA: Diagnosis not present

## 2018-06-10 LAB — TROPONIN I: Troponin I: <0.03 ng/mL (ref <0.03)

## 2018-06-10 MED ORDER — OXYCODONE-ACETAMINOPHEN 5-325 MG PO TABS
1.0000 | ORAL_TABLET | Freq: Once | ORAL | Status: AC
  Administered 2018-06-10: 1 via ORAL
  Filled 2018-06-10 (×2): qty 1

## 2018-06-10 NOTE — ED Provider Notes (Signed)
-----------------------------------------   1:54 AM on 06/10/2018 -----------------------------------------  Updated patient on repeat negative troponin.  Norco prescription submitted by previous provider.  Strict return precautions given.  Patient verbalizes understanding and agrees with plan of care.   Paulette Blanch, MD 06/10/18 [phone]

## 2018-06-10 NOTE — ED Notes (Signed)
Patient refused oxycodone because he has to drive home

## 2018-06-10 NOTE — ED Notes (Signed)
Updated wife by phone

## 2018-06-10 NOTE — Telephone Encounter (Signed)
Noted, will see for follow up today.

## 2018-06-12 ENCOUNTER — Encounter: Payer: Self-pay | Admitting: Internal Medicine

## 2018-06-12 NOTE — Progress Notes (Signed)
Virtual Visit via Video Note  I connected with Richard [name] on 06/12/18 at  3:45 PM EDT by a video enabled telemedicine application and verified that I am speaking with the correct person using two identifiers.  Location: Patient: Home Provider: Office   I discussed the limitations of evaluation and management by telemedicine and the availability of in person appointments. The patient expressed understanding and agreed to proceed.  History of Present Illness:  Pt due for ER followup. He went to the ER 5/20 with c/o left sided chest pain, pain with taking a deep breath. ECG was normal. Chest xray negative for infection. CTA chest negative for PE. Labs including cardiac enzymes were normal. They advised him that he had costochondritis. He was treated with Percocet in the ER and given a RX for Hydrocodone. He reports the Hydrocodone will dull the pain for a few hours. Tylenol and Tramadol do nothing. He takes Baclofen and Gabapentin daily as prescribed. He reports the pain is still present. He has had a previous cardiac workup for similar symptoms.  Past Medical History:  Diagnosis Date  . Arthritis   . Arthrofibrosis of total knee arthroplasty ([place]) 05/30/2015  . Bilateral pulmonary embolism ([place])    a. 03/2015 CTA: acute bilat PE; b. IVC filter placed in 2017--> removed 2018.  Marland Kitchen Chest pain    a. 2015 Abnl MV;  b. 2015 Cath: nl cors.  . Chronic diastolic CHF (congestive heart failure) ([place])    a. 03/2015 Echo: EF 55-65%, poor windows, mildly dil RV.  Marland Kitchen DVT (deep venous thrombosis) ([place])    a. 03/2015 U/S: occlusive DVT w/in the distal aspect of the Left fem vein through the L popliteal vein.  Marland Kitchen GERD (gastroesophageal reflux disease)   . Headache   . Hypertensive heart disease   . Obesity   . OSA (obstructive sleep apnea)    uses CPAP nightly  . PAF (paroxysmal atrial fibrillation) ([place])   . Primary localized osteoarthritis of left knee    a. 11/2014 s/p L TKA.  . Primary localized  osteoarthritis of right knee    a. 02/2014 s/p R Partial Knee arthroplasty.  . Stiffness of left knee    a. 12/2014 s/p manipulation under anesthesia.  . Urine incontinence     Current Outpatient Medications  Medication Sig Dispense Refill  . acetaminophen (TYLENOL) 500 MG tablet Take 1,000 mg by mouth every 6 (six) hours as needed for moderate pain.    Marland Kitchen apixaban (ELIQUIS) 5 MG TABS tablet TAKE 1 TABLET (5 MG TOTAL) BY MOUTH 2 (TWO) TIMES DAILY. 60 tablet 6  . baclofen (LIORESAL) 10 MG tablet TAKE 1-2 TABLETS (10-20 MG TOTAL) BY MOUTH 3 (THREE) TIMES DAILY AS NEEDED FOR MUSCLE SPASMS. 180 tablet 0  . flecainide (TAMBOCOR) 100 MG tablet Take 1 tablet (100 mg total) by mouth 2 (two) times daily. 180 tablet 2  . furosemide (LASIX) 40 MG tablet TAKE 1 TABLET (40 MG TOTAL) BY MOUTH DAILY AS NEEDED. MUST SCHEDULE ANNUAL EXAM 30 tablet 0  . gabapentin (NEURONTIN) 100 MG capsule Take 1 capsule (100 mg total) by mouth daily as needed. (Patient taking differently: Take 100 mg by mouth daily as needed (for hand pain). ) 30 capsule 2  . HYDROcodone-acetaminophen (NORCO/VICODIN) 5-325 MG tablet Take 1 tablet by mouth every 6 (six) hours as needed for moderate pain. 15 tablet 0  . Sennosides-Docusate Sodium (SM STOOL SOFTENER/LAXATIVE PO) Take 1 tablet by mouth at bedtime.    . traMADol (  ULTRAM) 50 MG tablet TAKE 2 TABLETS BY MOUTH EVERY NIGHT AT BEDTIME AS NEEDED 60 tablet 0  . Vitamin D, Ergocalciferol, (DRISDOL) 1.25 MG (50000 UT) CAPS capsule Take 1 capsule (50,000 Units total) by mouth every 7 (seven) days. 12 capsule 0  . metoprolol succinate (TOPROL-XL) 25 MG 24 hr tablet Take 1 tablet (25 mg total) by mouth daily. Take with or immediately following a meal. 90 tablet 3   Current Facility-Administered Medications  Medication Dose Route Frequency Provider Last Rate Last Dose  . cyanocobalamin ((VITAMIN B-12)) injection 1,000 mcg  1,000 mcg Intramuscular Q14 Days Jearld Fenton, NP   1,000 mcg at  04/08/18 3299    Allergies  Allergen Reactions  . Penicillin G Other (See Comments)    From childhood; uncertain of reaction Has patient had a PCN reaction causing immediate rash, facial/tongue/throat swelling, SOB or lightheadedness with hypotension: Unk Has patient had a PCN reaction causing severe rash involving mucus membranes or skin necrosis: Unk Has patient had a PCN reaction that required hospitalization: Unk Has patient had a PCN reaction occurring within the last 10 years: No If all of the above answers are "NO", then may proceed with Cephalosporin use.  Marland Kitchen Penicillins Anaphylaxis and Other (See Comments)    From childhood; uncertain of reaction Has patient had a PCN reaction causing immediate rash, facial/tongue/throat swelling, SOB or lightheadedness with hypotension: Unk Has patient had a PCN reaction causing severe rash involving mucus membranes or skin necrosis: Unk Has patient had a PCN reaction that required hospitalization: Unk Has patient had a PCN reaction occurring within the last 10 years: No If all of the above answers are "NO", then may proceed with Cephalosporin use.  . Vancomycin Other (See Comments)    Red Mans' syndrome    Family History  Problem Relation Age of Onset  . Coronary artery disease Mother   . Hyperlipidemia Mother   . Hypertension Mother   . Arthritis Mother   . Coronary artery disease Father   . Heart disease Paternal Grandmother   . Alzheimer's disease Paternal Grandfather     Social History   Socioeconomic History  . Marital status: Married    Spouse name: Not on file  . Number of children: Not on file  . Years of education: Not on file  . Highest education level: Not on file  Occupational History  . Occupation: Energy manager: [employer]  Social Needs  . Financial resource strain: Not on file  . Food insecurity:    Worry: Not on file    Inability: Not on file  . Transportation needs:    Medical: Not on file     Non-medical: Not on file  Tobacco Use  . Smoking status: Never Smoker  . Smokeless tobacco: Never Used  Substance and Sexual Activity  . Alcohol use: Yes    Comment: occassional  . Drug use: No  . Sexual activity: Yes  Lifestyle  . Physical activity:    Days per week: Not on file    Minutes per session: Not on file  . Stress: Not on file  Relationships  . Social connections:    Talks on phone: Not on file    Gets together: Not on file    Attends religious service: Not on file    Active member of club or organization: Not on file    Attends meetings of clubs or organizations: Not on file    Relationship status: Not on file  .  Intimate partner violence:    Fear of current or ex partner: Not on file    Emotionally abused: Not on file    Physically abused: Not on file    Forced sexual activity: Not on file  Other Topics Concern  . Not on file  Social History Narrative  . Not on file     Constitutional: Denies fever, malaise, fatigue, headache or abrupt weight changes.  HEENT: Denies eye pain, eye redness, ear pain, ringing in the ears, wax buildup, runny nose, nasal congestion, bloody nose, or sore throat. Respiratory: Pt reports pain with taking a deep breath. Denies difficulty breathing, shortness of breath, cough or sputum production.   Cardiovascular: Denies chest pain, chest tightness, palpitations or swelling in the hands or feet.  Gastrointestinal: Denies abdominal pain, bloating, constipation, diarrhea or blood in the stool.  GU: Denies urgency, frequency, pain with urination, burning sensation, blood in urine, odor or discharge. Musculoskeletal: Pt reports chest wall pain, right shoulder pain. Denies decrease in range of motion, difficulty with gait, or joint swelling.  Skin: Denies redness, rashes, lesions or ulcercations.    No other specific complaints in a complete review of systems (except as listed in HPI above). There were no vitals taken for this visit.  Wt  Readings from Last 3 Encounters:  06/09/18 (!) 315 lb (142.9 kg)  01/16/18 (!) 310 lb (140.6 kg)  11/27/17 300 lb (136.1 kg)    General: Appears his stated age, obese,in NAD. Skin: Warm, dry and intact. No rash noted. Pulmonary/Chest: Normal effortNo respiratory distress.  Musculoskeletal: Normal internal and external rotation of the left shoulder. He points to left anterior chest as his site of pain, reports it is tender with palpation. Neurological: Alert and oriented.  Psychiatric: Mood and affect normal. Behavior is normal. Judgment and thought content normal.     BMET    Component Value Date/Time   NA 139 06/09/2018 2115   NA 141 10/31/2017 1438   K 4.0 06/09/2018 2115   CL 106 06/09/2018 2115   CO2 25 06/09/2018 2115   GLUCOSE 97 06/09/2018 2115   BUN 20 06/09/2018 2115   BUN 18 10/31/2017 1438   CREATININE 1.42 (H) 06/09/2018 2115   CREATININE 1.27 12/06/2015 1600   CALCIUM 8.6 (L) 06/09/2018 2115   GFRNONAA 54 (L) 06/09/2018 2115   GFRAA >60 06/09/2018 2115    Lipid Panel     Component Value Date/Time   CHOL 170 10/31/2017 1438   TRIG 130 10/31/2017 1438   HDL 36 (L) 10/31/2017 1438   CHOLHDL 4.7 10/31/2017 1438   CHOLHDL 4.1 03/29/2015 0434   VLDL 14 03/29/2015 0434   LDLCALC 108 (H) 10/31/2017 1438    CBC    Component Value Date/Time   WBC 9.6 06/09/2018 2115   RBC 5.02 06/09/2018 2115   HGB 14.0 06/09/2018 2115   HGB 14.0 10/31/2017 1438   HCT 44.0 06/09/2018 2115   HCT 43.5 10/31/2017 1438   PLT 189 06/09/2018 2115   PLT 220 10/31/2017 1438   MCV 87.6 06/09/2018 2115   MCV 83 10/31/2017 1438   MCH 27.9 06/09/2018 2115   MCHC 31.8 06/09/2018 2115   RDW 14.1 06/09/2018 2115   RDW 14.4 10/31/2017 1438   LYMPHSABS 2.3 06/09/2018 2115   LYMPHSABS 1.6 10/31/2017 1438   MONOABS 0.8 06/09/2018 2115   EOSABS 0.3 06/09/2018 2115   EOSABS 0.2 10/31/2017 1438   BASOSABS 0.0 06/09/2018 2115   BASOSABS 0.0 10/31/2017 1438  Hgb A1C Lab Results   Component Value Date   HGBA1C 6.0 11/05/2016        Assessment and Plan:  ER Follow Up for Chest Wall Pain, Costochondritis:  ER notes, labs and imaging reviewed with patient. He will continue Tylenol, Tramadol and Baclofen as prescribed He has Norco as needed for severe pain Discussed alternating heat and ice Avoid NSAID's due to anticoagulation Consider Pred Taper vs possible referral to sports med if pain persist.  Follow Up Instructions:    I discussed the assessment and treatment plan with the patient. The patient was provided an opportunity to ask questions and all were answered. The patient agreed with the plan and demonstrated an understanding of the instructions.   The patient was advised to call back or seek an in-person evaluation if the symptoms worsen or if the condition fails to improve as anticipated.    Webb Silversmith, NP

## 2018-06-12 NOTE — Patient Instructions (Signed)
Costochondritis  Costochondritis is swelling and irritation (inflammation) of the tissue (cartilage) that connects your ribs to your breastbone (sternum). This causes pain in the front of your chest. Usually, the pain:  · Starts gradually.  · Is in more than one rib.  This condition usually goes away on its own over time.  Follow these instructions at home:  · Do not do anything that makes your pain worse.  · If directed, put ice on the painful area:  ? Put ice in a plastic bag.  ? Place a towel between your skin and the bag.  ? Leave the ice on for 20 minutes, 2-3 times a day.  · If directed, put heat on the affected area as often as told by your doctor. Use the heat source that your doctor tells you to use, such as a moist heat pack or a heating pad.  ? Place a towel between your skin and the heat source.  ? Leave the heat on for 20-30 minutes.  ? Take off the heat if your skin turns bright red. This is very important if you cannot feel pain, heat, or cold. You may have a greater risk of getting burned.  · Take over-the-counter and prescription medicines only as told by your doctor.  · Return to your normal activities as told by your doctor. Ask your doctor what activities are safe for you.  · Keep all follow-up visits as told by your doctor. This is important.  Contact a doctor if:  · You have chills or a fever.  · Your pain does not go away or it gets worse.  · You have a cough that does not go away.  Get help right away if:  · You are short of breath.  This information is not intended to replace advice given to you by your health care provider. Make sure you discuss any questions you have with your health care provider.  Document Released: 06/26/2007 Document Revised: 07/28/2015 Document Reviewed: 05/03/2015  Elsevier Interactive Patient Education © 2019 Elsevier Inc.

## 2018-06-18 ENCOUNTER — Other Ambulatory Visit: Payer: Self-pay | Admitting: *Deleted

## 2018-06-18 NOTE — Patient Outreach (Signed)
  [facility] [facility]) Care Management  06/18/2018  ALSON MCPHEETERS 05/06/1960 [id number]   UM (utilization Management) Screen  Referral Date: 06/18/18 Referral Source: UM referral Referral Reason: Would like to see if he qualifies for eliquis co pay assistance-  A [place] 06/17/18 Insurance: [employer] PPO 1  Last ED visit  on 06/10/18 for costochondritis NO admissions listed in the last 6 months  Outreach attempt # 1 successful to the mobile number  Patient is able to verify HIPAA Reviewed and addressed UM referral to [facility] with patient Mr Dino gives [facility] RN CM and any [facility] staff especially [place] permission to speak with his wife about his medicines and all care needs   Mr Aurel was assessed but confirms with [facility] RN CM that his primary need at this time is possible medication assistance with Eliquis  He denies needs from a possible [facility] RN, SW or NP at this time     Social: Mr Christapher lives at home with his family. His wife Ivin Booty is the primary support person. He is independent with his care needs and transportation to medical appointments. 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   Conditions:  Costochondritis, HTN, Left ventricular hypertrophy, peroneal DVT, PAD, OSA, GERD, chronic tension type headache  DME: none   Medications: Mr Dacen reports that Eliquis is costing him around $100 Monthly  He reports he has some Eliquis at his pharmacy that has not been picked up by his wife His wife assists by placing his medication in medicine containers and general administration needs for him at pharmacy Dr Rod Can, Cardiologist, is the provider that provides the Eliquis Rx (last seen in 11/2017). Mr Izea reports receiving samples for the first month but then had to begin to pay out of his pocket for Eliquis  He confirms no known assistance from medication patient assistance program or Social security medication  program   Appointments: 06/23/18 1515 Primary MD, Avie Echevaria NP   Advance Directives: He states he believes he has HPOA and it is his wife He is not interested in getting a living will at this time but will return a call if assistance is needed    Consent: [facility] RN CM reviewed [facility] services with patient. Patient gave verbal consent for services [facility] telephonic RN CM.   Plan: [facility] RN CM will refer Mr Denzell to [place] for medication assistance with Eliquis [facility] RN CM will close case at this time as patient has been assessed and no needs identified/needs resolved.   Pt encouraged to return a call to [place] CM prn  [facility] RN CM sent a successful outreach letter as discussed with [facility] brochure enclosed for review  Routed note to MDs  Joelene Millin L. Lavina Hamman, RN, BSN, [place] Coordinator Office number [phone] Mobile number [phone]  Main [facility] number [phone] Fax number [phone]

## 2018-06-19 ENCOUNTER — Other Ambulatory Visit: Payer: Self-pay | Admitting: Pharmacist

## 2018-06-19 NOTE — Patient Outreach (Signed)
[facility] [facility]) Care Management  [place]   06/19/2018  Richard [name] 05/24/1960 [id number]  Reason for referral: Medication Assistance with Eliquis  Referral source: [employer] Utilization Management Department Current insurance: [employer]  PMHx includes but not limited to:  Hx BL PE and DVT 3/'17 on chronic anticoagulation, chronic diastolic CHF (EF 28-36% 6/29), HTN, PAF, GERD, HA, obesity, OA, noted recent ED visit for CP, diagnosed with costochondritis  Outreach:  Successful telephone call with Richard [name].  HIPAA identifiers verified.   Subjective:  Patient reports he used [facility] January for filling Eliquis, but has since switched pharmacies to [facility].  He thinks he is paying $100 / month for Eliquis.  He states he has no difficulties with paying for his other medications.    Objective: The 10-year ASCVD risk score Mikey Bussing DC Jr., et al., 2013) is: 7.4%   Values used to calculate the score:     Age: 58 years     Sex: Male     Is Non-Hispanic African American: No     Diabetic: No     Tobacco smoker: No     Systolic Blood Pressure: 476 mmHg     Is BP treated: Yes     HDL Cholesterol: 36 mg/dL     Total Cholesterol: 170 mg/dL  Lab Results  Component Value Date   CREATININE 1.42 (H) 06/09/2018   CREATININE 1.09 11/21/2017   CREATININE 1.33 (H) 10/31/2017    Lab Results  Component Value Date   HGBA1C 6.0 11/05/2016    Lipid Panel     Component Value Date/Time   CHOL 170 10/31/2017 1438   TRIG 130 10/31/2017 1438   HDL 36 (L) 10/31/2017 1438   CHOLHDL 4.7 10/31/2017 1438   CHOLHDL 4.1 03/29/2015 0434   VLDL 14 03/29/2015 0434   LDLCALC 108 (H) 10/31/2017 1438    BP Readings from Last 3 Encounters:  06/10/18 117/66  01/16/18 122/76  11/27/17 120/76    Allergies  Allergen Reactions  . Penicillin G Other (See Comments)    From childhood; uncertain of reaction Has patient had a PCN reaction  causing immediate rash, facial/tongue/throat swelling, SOB or lightheadedness with hypotension: Unk Has patient had a PCN reaction causing severe rash involving mucus membranes or skin necrosis: Unk Has patient had a PCN reaction that required hospitalization: Unk Has patient had a PCN reaction occurring within the last 10 years: No If all of the above answers are "NO", then may proceed with Cephalosporin use.  Marland Kitchen Penicillins Anaphylaxis and Other (See Comments)    From childhood; uncertain of reaction Has patient had a PCN reaction causing immediate rash, facial/tongue/throat swelling, SOB or lightheadedness with hypotension: Unk Has patient had a PCN reaction causing severe rash involving mucus membranes or skin necrosis: Unk Has patient had a PCN reaction that required hospitalization: Unk Has patient had a PCN reaction occurring within the last 10 years: No If all of the above answers are "NO", then may proceed with Cephalosporin use.  . Vancomycin Other (See Comments)    Red Mans' syndrome    Medications Reviewed Today    Reviewed by Barbaraann Faster, RN (Registered Nurse) on 06/19/18 at 1044  Med List Status: <None>  Medication Order Taking? Sig Documenting Provider Last Dose Status Informant  acetaminophen (TYLENOL) 500 MG tablet [id number]  Take 1,000 mg by mouth every 6 (six) hours as needed for moderate pain. [provider]  Active Spouse/Significant Other  apixaban (ELIQUIS) 5 MG TABS tablet [id number] Yes TAKE 1 TABLET (5 MG TOTAL) BY MOUTH 2 (TWO) TIMES DAILY. Wellington Hampshire, MD Taking Active Spouse/Significant Other  baclofen (LIORESAL) 10 MG tablet [id number]  TAKE 1-2 TABLETS (10-20 MG TOTAL) BY MOUTH 3 (THREE) TIMES DAILY AS NEEDED FOR MUSCLE SPASMS. Jearld Fenton, NP  Active Spouse/Significant Other  cyanocobalamin ((VITAMIN B-12)) injection 1,000 mcg [id number]   Jearld Fenton, NP  Active   flecainide (TAMBOCOR) 100 MG tablet [id number]  Take 1 tablet (100 mg  total) by mouth 2 (two) times daily. Wellington Hampshire, MD  Active Spouse/Significant Other  furosemide (LASIX) 40 MG tablet [id number]  TAKE 1 TABLET (40 MG TOTAL) BY MOUTH DAILY AS NEEDED. MUST SCHEDULE ANNUAL EXAM [place], Coralie Keens, NP  Active Spouse/Significant Other  gabapentin (NEURONTIN) 100 MG capsule [id number]  Take 1 capsule (100 mg total) by mouth daily as needed.  Patient taking differently:  Take 100 mg by mouth daily as needed (for hand pain).    Jearld Fenton, NP  Active Spouse/Significant Other           Med Note Rocky Crafts   Tue Jun 09, 2018  9:34 PM) Pt takes differently: one capsule twice a day.  HYDROcodone-acetaminophen (NORCO/VICODIN) 5-325 MG tablet [id number]  Take 1 tablet by mouth every 6 (six) hours as needed for moderate pain. Nena Polio, MD  Active   metoprolol succinate (TOPROL-XL) 25 MG 24 hr tablet [id number]  Take 1 tablet (25 mg total) by mouth daily. Take with or immediately following a meal. Wellington Hampshire, MD  Expired 06/09/18 2359 Spouse/Significant Other  Sennosides-Docusate Sodium (SM STOOL SOFTENER/LAXATIVE PO) [id number]  Take 1 tablet by mouth at bedtime. [provider]  Active Spouse/Significant Other  traMADol (ULTRAM) 50 MG tablet [id number]  TAKE 2 TABLETS BY MOUTH EVERY NIGHT AT BEDTIME AS NEEDED Jearld Fenton, NP  Active Spouse/Significant Other  Vitamin D, Ergocalciferol, (DRISDOL) 1.25 MG (50000 UT) CAPS capsule [id number]  Take 1 capsule (50,000 Units total) by mouth every 7 (seven) days. Jearld Fenton, NP  Active Spouse/Significant Other           Med Note Rocky Crafts   Tue Jun 09, 2018  9:37 PM) Pt has run out of this medication.          ASSESSMENT: Date Discharged from Hospital: 06/10/2018 Date Medication Reconciliation Performed: 06/19/2018  Medications:  New at Discharge: . Hydrocodone-acetaminophen  Adjustments at Discharge: . None  Discontinued at Discharge:   None   Patient was recently discharged from  hospital and all medications have been reviewed.  Drugs sorted by system:  Neurologic/Psychologic: gabapentin  Hematologic: apixaban  Cardiovascular: metoprolol, flecainide  Gastrointestinal: sennosides-docusate  Pain: baclofen, hydrocodone-acetaminophen, tramadol  Vitamins/Minerals/Supplements: cyanocobalamin, ergocalciferol  Medication Assistance Findings:  Medication assistance needs identified: Eliquis  Extra Help:  Not eligible for Extra Help Low Income Subsidy based on reported income and assets  Patient Assistance Programs: Eliquis made by BMS o Income requirement met: Yes o Out-of-pocket prescription expenditure met:   No (3% household income) - Patient has not met application requirements to apply for this patient assistance program at this time. - Reviewed program requirements with patient.    3-way call placed to [place].  Co-pay for Eliquis verified as $30 / 30 day supply, $90 / 90 day supply.  Patient voiced understanding and states he can afford this co-pay.  He may have had a deductible earlier in the  year that is now met.  No further questions or concerns.    Plan: . Will close [facility] pharmacy case as no further medication needs identified at this time.  Am happy to assist in the future as needed.    Ralene Bathe, PharmD, [place] [phone]

## 2018-06-23 ENCOUNTER — Other Ambulatory Visit: Payer: Self-pay

## 2018-06-23 ENCOUNTER — Encounter: Payer: Self-pay | Admitting: Internal Medicine

## 2018-06-23 ENCOUNTER — Ambulatory Visit (INDEPENDENT_AMBULATORY_CARE_PROVIDER_SITE_OTHER): Payer: PPO | Admitting: Internal Medicine

## 2018-06-23 VITALS — BP 124/76 | HR 62 | Temp 97.7°F | Ht 69.0 in | Wt 311.0 lb

## 2018-06-23 DIAGNOSIS — I82452 Acute embolism and thrombosis of left peroneal vein: Secondary | ICD-10-CM

## 2018-06-23 DIAGNOSIS — G4733 Obstructive sleep apnea (adult) (pediatric): Secondary | ICD-10-CM | POA: Diagnosis not present

## 2018-06-23 DIAGNOSIS — K219 Gastro-esophageal reflux disease without esophagitis: Secondary | ICD-10-CM

## 2018-06-23 DIAGNOSIS — Z86711 Personal history of pulmonary embolism: Secondary | ICD-10-CM | POA: Diagnosis not present

## 2018-06-23 DIAGNOSIS — M159 Polyosteoarthritis, unspecified: Secondary | ICD-10-CM

## 2018-06-23 DIAGNOSIS — M1990 Unspecified osteoarthritis, unspecified site: Secondary | ICD-10-CM | POA: Insufficient documentation

## 2018-06-23 DIAGNOSIS — I5032 Chronic diastolic (congestive) heart failure: Secondary | ICD-10-CM

## 2018-06-23 DIAGNOSIS — Z125 Encounter for screening for malignant neoplasm of prostate: Secondary | ICD-10-CM

## 2018-06-23 DIAGNOSIS — I480 Paroxysmal atrial fibrillation: Secondary | ICD-10-CM

## 2018-06-23 DIAGNOSIS — I10 Essential (primary) hypertension: Secondary | ICD-10-CM | POA: Diagnosis not present

## 2018-06-23 DIAGNOSIS — M8949 Other hypertrophic osteoarthropathy, multiple sites: Secondary | ICD-10-CM

## 2018-06-23 DIAGNOSIS — M150 Primary generalized (osteo)arthritis: Secondary | ICD-10-CM | POA: Diagnosis not present

## 2018-06-23 DIAGNOSIS — Z0000 Encounter for general adult medical examination without abnormal findings: Secondary | ICD-10-CM | POA: Diagnosis not present

## 2018-06-23 NOTE — Assessment & Plan Note (Signed)
Discussed how weight loss could help reduce joint pain Continue Baclofen, Gabapentin and Tramadol Will monitor

## 2018-06-23 NOTE — Assessment & Plan Note (Signed)
Discussed how weight loss could help improve reflux Continue CPAP for now

## 2018-06-23 NOTE — Assessment & Plan Note (Signed)
Lifelong Eliquis CBC today 

## 2018-06-23 NOTE — Assessment & Plan Note (Signed)
Compensated Continue Metoprolol, Furosemide and Flecainide Encouraged low salt diet, monitor weights CMET today

## 2018-06-23 NOTE — Progress Notes (Signed)
Subjective:    Patient ID: Richard [name], male    DOB: 07/03/1960, 58 y.o.   MRN: [id number]  HPI  Pt due for his subsequent Medicare Wellness Exam. He is also due for follow up of chronic conditions.  OA: Mainly in his hands, elbows. He takes Baclofen, Gabapentin and Tramadol as needed with some relief of symptoms.  History of DVT/PE: On lifelong Eliquis. He denies s/s of bleeding.  CHF, Diastolic: Compensated. He is taking Flecainide, Furosemide and Metoprolol as prescribed. Echo from 03/2015 reviewed. He follows with cardiology.  GERD: Intermittent. Triggered by tomato based foods, eating late at night. He takes Tums as needed with good relief of symptoms. There is no upper GI on file.  HTN: His BP today is 124/76. He is taking Flecainide, Furosemide and Metoprolol as prescribed. ECK from 05/2018 reviewed.  OSA: He averages 6 hours of sleeps with the use of his CPAP. There is no sleep study on file.  Paroxysmal Afib: Stable on Metoprolol and Eliquis. EKG from 03/2015 reviewed.  HPI:  Past Medical History:  Diagnosis Date  . Arthritis   . Arthrofibrosis of total knee arthroplasty ([place]) 05/30/2015  . Bilateral pulmonary embolism ([place])    a. 03/2015 CTA: acute bilat PE; b. IVC filter placed in 2017--> removed 2018.  Marland Kitchen Chest pain    a. 2015 Abnl MV;  b. 2015 Cath: nl cors.  . Chronic diastolic CHF (congestive heart failure) ([place])    a. 03/2015 Echo: EF 55-65%, poor windows, mildly dil RV.  Marland Kitchen DVT (deep venous thrombosis) ([place])    a. 03/2015 U/S: occlusive DVT w/in the distal aspect of the Left fem vein through the L popliteal vein.  Marland Kitchen GERD (gastroesophageal reflux disease)   . Headache   . Hypertensive heart disease   . Obesity   . OSA (obstructive sleep apnea)    uses CPAP nightly  . PAF (paroxysmal atrial fibrillation) ([place])   . Primary localized osteoarthritis of left knee    a. 11/2014 s/p L TKA.  . Primary localized osteoarthritis of right knee    a. 02/2014 s/p R Partial  Knee arthroplasty.  . Stiffness of left knee    a. 12/2014 s/p manipulation under anesthesia.  . Urine incontinence     Current Outpatient Medications  Medication Sig Dispense Refill  . acetaminophen (TYLENOL) 500 MG tablet Take 1,000 mg by mouth every 6 (six) hours as needed for moderate pain.    Marland Kitchen apixaban (ELIQUIS) 5 MG TABS tablet TAKE 1 TABLET (5 MG TOTAL) BY MOUTH 2 (TWO) TIMES DAILY. 60 tablet 6  . baclofen (LIORESAL) 10 MG tablet TAKE 1-2 TABLETS (10-20 MG TOTAL) BY MOUTH 3 (THREE) TIMES DAILY AS NEEDED FOR MUSCLE SPASMS. 180 tablet 0  . flecainide (TAMBOCOR) 100 MG tablet Take 1 tablet (100 mg total) by mouth 2 (two) times daily. 180 tablet 2  . furosemide (LASIX) 40 MG tablet TAKE 1 TABLET (40 MG TOTAL) BY MOUTH DAILY AS NEEDED. MUST SCHEDULE ANNUAL EXAM 30 tablet 0  . gabapentin (NEURONTIN) 100 MG capsule Take 1 capsule (100 mg total) by mouth daily as needed. (Patient taking differently: Take 100 mg by mouth daily as needed (for hand pain). ) 30 capsule 2  . HYDROcodone-acetaminophen (NORCO/VICODIN) 5-325 MG tablet Take 1 tablet by mouth every 6 (six) hours as needed for moderate pain. 15 tablet 0  . metoprolol succinate (TOPROL-XL) 25 MG 24 hr tablet Take 1 tablet (25 mg total) by mouth daily. Take with or  immediately following a meal. 90 tablet 3  . Sennosides-Docusate Sodium (SM STOOL SOFTENER/LAXATIVE PO) Take 1 tablet by mouth at bedtime.    . traMADol (ULTRAM) 50 MG tablet TAKE 2 TABLETS BY MOUTH EVERY NIGHT AT BEDTIME AS NEEDED 60 tablet 0   Current Facility-Administered Medications  Medication Dose Route Frequency Provider Last Rate Last Dose  . cyanocobalamin ((VITAMIN B-12)) injection 1,000 mcg  1,000 mcg Intramuscular Q14 Days Jearld Fenton, NP   1,000 mcg at 04/08/18 3267    Allergies  Allergen Reactions  . Penicillin G Other (See Comments)    From childhood; uncertain of reaction Has patient had a PCN reaction causing immediate rash, facial/tongue/throat  swelling, SOB or lightheadedness with hypotension: Unk Has patient had a PCN reaction causing severe rash involving mucus membranes or skin necrosis: Unk Has patient had a PCN reaction that required hospitalization: Unk Has patient had a PCN reaction occurring within the last 10 years: No If all of the above answers are "NO", then may proceed with Cephalosporin use.  Marland Kitchen Penicillins Anaphylaxis and Other (See Comments)    From childhood; uncertain of reaction Has patient had a PCN reaction causing immediate rash, facial/tongue/throat swelling, SOB or lightheadedness with hypotension: Unk Has patient had a PCN reaction causing severe rash involving mucus membranes or skin necrosis: Unk Has patient had a PCN reaction that required hospitalization: Unk Has patient had a PCN reaction occurring within the last 10 years: No If all of the above answers are "NO", then may proceed with Cephalosporin use.  . Vancomycin Other (See Comments)    Red Mans' syndrome    Family History  Problem Relation Age of Onset  . Coronary artery disease Mother   . Hyperlipidemia Mother   . Hypertension Mother   . Arthritis Mother   . Coronary artery disease Father   . Heart disease Paternal Grandmother   . Alzheimer's disease Paternal Grandfather     Social History   Socioeconomic History  . Marital status: Married    Spouse name: Not on file  . Number of children: Not on file  . Years of education: Not on file  . Highest education level: Not on file  Occupational History  . Occupation: Energy manager: [employer]  Social Needs  . Financial resource strain: Not on file  . Food insecurity:    Worry: Not on file    Inability: Not on file  . Transportation needs:    Medical: Not on file    Non-medical: Not on file  Tobacco Use  . Smoking status: Never Smoker  . Smokeless tobacco: Never Used  Substance and Sexual Activity  . Alcohol use: Yes    Comment: occassional  . Drug use: No  . Sexual  activity: Yes  Lifestyle  . Physical activity:    Days per week: Not on file    Minutes per session: Not on file  . Stress: Not on file  Relationships  . Social connections:    Talks on phone: Not on file    Gets together: Not on file    Attends religious service: Not on file    Active member of club or organization: Not on file    Attends meetings of clubs or organizations: Not on file    Relationship status: Not on file  . Intimate partner violence:    Fear of current or ex partner: Not on file    Emotionally abused: Not on file    Physically  abused: Not on file    Forced sexual activity: Not on file  Other Topics Concern  . Not on file  Social History Narrative  . Not on file    Hospitiliaztions: 06/09/2018  Health Maintenance:     Flu: never  Tetanus: 02/2009  PSA Screening: > 2 years ago  Colon Screening: never  Vision Screening: as needed  Dentist: as needed   Providers:   PCP: Webb Silversmith, NP-C  Orthopedist: Dr. Mayer Camel  Cardiologist: Dr. Fletcher Anon  Vascular: Dr. Delana Meyer   I have personally reviewed and have noted:  1. The patient's medical and social history 2. Their use of alcohol, tobacco or illicit drugs 3. Their current medications and supplements 4. The patient's functional ability including ADL's, fall risks, home safety risks and hearing or visual impairment. 5. Diet and physical activities 6. Evidence for depression or mood disorder  Subjective:   Review of Systems:   Constitutional: Denies fever, malaise, fatigue, headache or abrupt weight changes.  HEENT: Pt reports blurred vision. Denies eye pain, eye redness, ear pain, ringing in the ears, wax buildup, runny nose, nasal congestion, bloody nose, or sore throat. Respiratory: Denies difficulty breathing, shortness of breath, cough or sputum production.   Cardiovascular: Denies chest pain, chest tightness, palpitations or swelling in the hands or feet.  Gastrointestinal: Denies abdominal pain,  bloating, constipation, diarrhea or blood in the stool.  GU: Denies urgency, frequency, pain with urination, burning sensation, blood in urine, odor or discharge. Musculoskeletal: Pt reports chest wall pain. Denies decrease in range of motion, difficulty with gait,  or joint pain and swelling.  Skin: Denies redness, rashes, lesions or ulcercations.  Neurological: Denies dizziness, difficulty with memory, difficulty with speech or problems with balance and coordination.  Psych: Denies anxiety, depression, SI/HI.  No other specific complaints in a complete review of systems (except as listed in HPI above).  Objective:  PE:   BP 124/76   Pulse 62   Temp 97.7 F (36.5 C) (Oral)   Ht 5\' 9"  (1.753 m)   Wt (!) 311 lb (141.1 kg)   SpO2 98%   BMI 45.93 kg/m   Wt Readings from Last 3 Encounters:  06/23/18 (!) 311 lb (141.1 kg)  06/09/18 (!) 315 lb (142.9 kg)  01/16/18 (!) 310 lb (140.6 kg)    General: Appears his stated age, obese, in NAD. Skin: Warm, dry and intact. No rashes, lesions or ulcerations noted. HEENT: Head: normal shape and size; Eyes: sclera white, no icterus, conjunctiva pink, PERRLA and EOMs intact; Ears: Tm's gray and intact, normal light reflex;  Neck: Neck supple, trachea midline. No masses, lumps or thyromegaly present.  Cardiovascular: Normal rate and rhythm. S1,S2 noted.  No murmur, rubs or gallops noted. No JVD or BLE edema. No carotid bruits noted. Pulmonary/Chest: Normal effort and positive vesicular breath sounds. No respiratory distress. No wheezes, rales or ronchi noted.  Abdomen: Soft and nontender. Normal bowel sounds. Ventral hernia noted. Liver, spleen and kidneys non palpable. Musculoskeletal:  Strength 5/5 BUE/BLE. No difficulty with gait. Neurological: Alert and oriented. Cranial nerves II-XII grossly intact. Coordination normal.  Psychiatric: Mood and affect normal. Behavior is normal. Judgment and thought content normal.     BMET    Component  Value Date/Time   NA 139 06/09/2018 2115   NA 141 10/31/2017 1438   K 4.0 06/09/2018 2115   CL 106 06/09/2018 2115   CO2 25 06/09/2018 2115   GLUCOSE 97 06/09/2018 2115   BUN 20 06/09/2018 2115  BUN 18 10/31/2017 1438   CREATININE 1.42 (H) 06/09/2018 2115   CREATININE 1.27 12/06/2015 1600   CALCIUM 8.6 (L) 06/09/2018 2115   GFRNONAA 54 (L) 06/09/2018 2115   GFRAA >60 06/09/2018 2115    Lipid Panel     Component Value Date/Time   CHOL 170 10/31/2017 1438   TRIG 130 10/31/2017 1438   HDL 36 (L) 10/31/2017 1438   CHOLHDL 4.7 10/31/2017 1438   CHOLHDL 4.1 03/29/2015 0434   VLDL 14 03/29/2015 0434   LDLCALC 108 (H) 10/31/2017 1438    CBC    Component Value Date/Time   WBC 9.6 06/09/2018 2115   RBC 5.02 06/09/2018 2115   HGB 14.0 06/09/2018 2115   HGB 14.0 10/31/2017 1438   HCT 44.0 06/09/2018 2115   HCT 43.5 10/31/2017 1438   PLT 189 06/09/2018 2115   PLT 220 10/31/2017 1438   MCV 87.6 06/09/2018 2115   MCV 83 10/31/2017 1438   MCH 27.9 06/09/2018 2115   MCHC 31.8 06/09/2018 2115   RDW 14.1 06/09/2018 2115   RDW 14.4 10/31/2017 1438   LYMPHSABS 2.3 06/09/2018 2115   LYMPHSABS 1.6 10/31/2017 1438   MONOABS 0.8 06/09/2018 2115   EOSABS 0.3 06/09/2018 2115   EOSABS 0.2 10/31/2017 1438   BASOSABS 0.0 06/09/2018 2115   BASOSABS 0.0 10/31/2017 1438    Hgb A1C Lab Results  Component Value Date   HGBA1C 6.0 11/05/2016      Assessment and Plan:   Medicare Annual Wellness Visit:  Diet: He does eat meat. He consumes fruits and veggies daily. He occasionally eats fried foods. He drinks mostly water, tea. Physical activity: Walking Depression/mood screen: Negative Hearing: Intact to whispered voice Visual acuity: Grossly normal ADLs: Capable Fall risk: None Home safety: Good Cognitive evaluation: Intact to orientation, naming, recall and repetition EOL planning: No adv directives, full code/ I agree  Preventative Medicine: He declines flu shot. Tetanus UTD.  PSA done with labs. He declines colonoscopy but is agreeable to Cologuard, will order. Encouraged him to consume a balanced diet and exercise regimen. Advised him to see an eye doctor and dentist annually. Will check CBC, CMET, Lipid, A1C and Vit D today.  Morbid Obesity:  Referral to Bariatric surgery placed  Next appointment: 6 months, follow up chronic conditions.   Webb Silversmith, NP

## 2018-06-23 NOTE — Assessment & Plan Note (Signed)
Lifelong Eliquis 

## 2018-06-23 NOTE — Assessment & Plan Note (Signed)
Continue Metoprolol and Eliquis CBC today

## 2018-06-23 NOTE — Assessment & Plan Note (Signed)
Discussed how weight loss could help improve symptoms CBC and CMET today Continue Tums prn

## 2018-06-23 NOTE — Assessment & Plan Note (Signed)
Controlled on Metoprolol and Furosemide Reinforced DASH diet and exercise for weight loss CMET today

## 2018-06-23 NOTE — Patient Instructions (Signed)

## 2018-06-24 ENCOUNTER — Telehealth: Payer: Self-pay

## 2018-06-24 LAB — COMPREHENSIVE METABOLIC PANEL
ALT: 34 U/L (ref 0–53)
AST: 24 U/L (ref 0–37)
Albumin: 3.9 g/dL (ref 3.5–5.2)
Alkaline Phosphatase: 76 U/L (ref 39–117)
BUN: 18 mg/dL (ref 6–23)
CO2: 31 mEq/L (ref 19–32)
Calcium: 9.2 mg/dL (ref 8.4–10.5)
Chloride: 103 mEq/L (ref 96–112)
Creatinine, Ser: 1.27 mg/dL (ref 0.40–1.50)
GFR: 58.38 mL/min — ABNORMAL LOW (ref >60.00)
Glucose, Bld: 80 mg/dL (ref 70–99)
Potassium: 4.7 mEq/L (ref 3.5–5.1)
Sodium: 139 mEq/L (ref 135–145)
Total Bilirubin: 0.5 mg/dL (ref 0.2–1.2)
Total Protein: 6.6 g/dL (ref 6.0–8.3)

## 2018-06-24 LAB — CBC
HCT: 46 % (ref 39.0–52.0)
Hemoglobin: 15.1 g/dL (ref 13.0–17.0)
MCHC: 32.8 g/dL (ref 30.0–36.0)
MCV: 88 fl (ref 78.0–100.0)
Platelets: 195 10*3/uL (ref 150.0–400.0)
RBC: 5.23 Mil/uL (ref 4.22–5.81)
RDW: 15.4 % (ref 11.5–15.5)
WBC: 9.5 10*3/uL (ref 4.0–10.5)

## 2018-06-24 LAB — LIPID PANEL
Cholesterol: 164 mg/dL (ref 0–200)
HDL: 40.2 mg/dL (ref >39.00)
LDL Cholesterol: 106 mg/dL — ABNORMAL HIGH (ref 0–99)
NonHDL: 123.84
Total CHOL/HDL Ratio: 4
Triglycerides: 91 mg/dL (ref 0.0–149.0)
VLDL: 18.2 mg/dL (ref 0.0–40.0)

## 2018-06-24 LAB — HEMOGLOBIN A1C: Hgb A1c MFr Bld: 5.7 % (ref 4.6–6.5)

## 2018-06-24 LAB — PSA, MEDICARE: PSA: 0.24 ng/ml (ref 0.10–4.00)

## 2018-06-24 NOTE — Telephone Encounter (Signed)
Virtual Visit Pre-Appointment Phone Call  "Richard [name], I am calling you today to discuss your upcoming appointment. We are currently trying to limit exposure to the virus that causes COVID-19 by seeing patients at home rather than in the office."  1. "What is the BEST phone number to call the day of the visit?" - include this in appointment notes  2. "Do you have or have access to (through a family member/friend) a smartphone with video capability that we can use for your visit?" a. If yes - list this number in appt notes as "cell" (if different from BEST phone #) and list the appointment type as a VIDEO visit in appointment notes b. If no - list the appointment type as a PHONE visit in appointment notes  3. Confirm consent - "In the setting of the current Covid19 crisis, you are scheduled for a video visit with your provider on 06/25/2018 at 3:30PM.  Just as we do with many in-office visits, in order for you to participate in this visit, we must obtain consent.  If you'd like, I can send this to your mychart (if signed up) or email for you to review.  Otherwise, I can obtain your verbal consent now.  All virtual visits are billed to your insurance company just like a normal visit would be.  By agreeing to a virtual visit, we'd like you to understand that the technology does not allow for your provider to perform an examination, and thus may limit your provider's ability to fully assess your condition. If your provider identifies any concerns that need to be evaluated in person, we will make arrangements to do so.  Finally, though the technology is pretty good, we cannot assure that it will always work on either your or our end, and in the setting of a video visit, we may have to convert it to a phone-only visit.  In either situation, we cannot ensure that we have a secure connection.  Are you willing to proceed?" STAFF: Did the patient verbally acknowledge consent to telehealth visit? Document YES/NO  here: YES  4. Advise patient to be prepared - "Two hours prior to your appointment, go ahead and check your blood pressure, pulse, oxygen saturation, and your weight (if you have the equipment to check those) and write them all down. When your visit starts, your provider will ask you for this information. If you have an Apple Watch or Kardia device, please plan to have heart rate information ready on the day of your appointment. Please have a pen and paper handy nearby the day of the visit as well."  5. Give patient instructions for MyChart download to smartphone OR [email or web address] as below if video visit (depending on what platform provider is using)  6. Inform patient they will receive a phone call 15 minutes prior to their appointment time (may be from unknown caller ID) so they should be prepared to answer    TELEPHONE CALL NOTE  Richard [name] has been deemed a candidate for a follow-up tele-health visit to limit community exposure during the Covid-19 pandemic. I spoke with the patient via phone to ensure availability of phone/video source, confirm preferred email & phone number, and discuss instructions and expectations.  I reminded Richard [name] to be prepared with any vital sign and/or heart rhythm information that could potentially be obtained via home monitoring, at the time of his visit. I reminded Richard [name] to expect a phone call prior to his visit.  Richard [name] 06/24/2018 11:09 AM   INSTRUCTIONS FOR DOWNLOADING THE MYCHART APP TO SMARTPHONE  - The patient must first make sure to have activated MyChart and know their login information - If Apple, go to [employer] and type in MyChart in the search bar and download the app. If Android, ask patient to go to [employer] and type in [place] in the search bar and download the app. The app is free but as with any other app downloads, their phone may require them to verify saved payment information or  Apple/Android password.  - The patient will need to then log into the app with their MyChart username and password, and select [facility] as their healthcare provider to link the account. When it is time for your visit, go to the MyChart app, find appointments, and click Begin Video Visit. Be sure to Select Allow for your device to access the Microphone and Camera for your visit. You will then be connected, and your provider will be with you shortly.  **If they have any issues connecting, or need assistance please contact MyChart service desk [phone])**  **If using a computer, in order to ensure the best quality for their visit they will need to use either of the following Internet Browsers: [employer], or Google Chrome**  IF USING DOXIMITY or [email or web address].ME - The patient will receive a link just prior to their visit by text.     FULL LENGTH CONSENT FOR TELE-HEALTH VISIT   I hereby voluntarily request, consent and authorize [facility] and its employed or contracted physicians, physician assistants, nurse practitioners or other licensed health care professionals (the Practitioner), to provide me with telemedicine health care services (the "Services") as deemed necessary by the treating Practitioner. I acknowledge and consent to receive the Services by the Practitioner via telemedicine. I understand that the telemedicine visit will involve communicating with the Practitioner through live audiovisual communication technology and the disclosure of certain medical information by electronic transmission. I acknowledge that I have been given the opportunity to request an in-person assessment or other available alternative prior to the telemedicine visit and am voluntarily participating in the telemedicine visit.  I understand that I have the right to withhold or withdraw my consent to the use of telemedicine in the course of my care at any time, without affecting my right to future care  or treatment, and that the Practitioner or I may terminate the telemedicine visit at any time. I understand that I have the right to inspect all information obtained and/or recorded in the course of the telemedicine visit and may receive copies of available information for a reasonable fee.  I understand that some of the potential risks of receiving the Services via telemedicine include:  Marland Kitchen Delay or interruption in medical evaluation due to technological equipment failure or disruption; . Information transmitted may not be sufficient (e.g. poor resolution of images) to allow for appropriate medical decision making by the Practitioner; and/or  . In rare instances, security protocols could fail, causing a breach of personal health information.  Furthermore, I acknowledge that it is my responsibility to provide information about my medical history, conditions and care that is complete and accurate to the best of my ability. I acknowledge that Practitioner's advice, recommendations, and/or decision may be based on factors not within their control, such as incomplete or inaccurate data provided by me or distortions of diagnostic images or specimens that may result from electronic transmissions. I understand that  the practice of medicine is not an exact science and that Practitioner makes no warranties or guarantees regarding treatment outcomes. I acknowledge that I will receive a copy of this consent concurrently upon execution via email to the email address I last provided but may also request a printed copy by calling the office of [facility].    I understand that my insurance will be billed for this visit.   I have read or had this consent read to me. . I understand the contents of this consent, which adequately explains the benefits and risks of the Services being provided via telemedicine.  . I have been provided ample opportunity to ask questions regarding this consent and the Services and have had  my questions answered to my satisfaction. . I give my informed consent for the services to be provided through the use of telemedicine in my medical care  By participating in this telemedicine visit I agree to the above.

## 2018-06-25 ENCOUNTER — Encounter: Payer: Self-pay | Admitting: Nurse Practitioner

## 2018-06-25 ENCOUNTER — Other Ambulatory Visit: Payer: Self-pay

## 2018-06-25 ENCOUNTER — Telehealth (INDEPENDENT_AMBULATORY_CARE_PROVIDER_SITE_OTHER): Payer: PPO | Admitting: Nurse Practitioner

## 2018-06-25 VITALS — BP 124/76 | HR 62 | Temp 98.0°F | Wt 311.0 lb

## 2018-06-25 DIAGNOSIS — I480 Paroxysmal atrial fibrillation: Secondary | ICD-10-CM

## 2018-06-25 DIAGNOSIS — M940 Chondrocostal junction syndrome [Tietze]: Secondary | ICD-10-CM | POA: Diagnosis not present

## 2018-06-25 DIAGNOSIS — I10 Essential (primary) hypertension: Secondary | ICD-10-CM

## 2018-06-25 DIAGNOSIS — I5032 Chronic diastolic (congestive) heart failure: Secondary | ICD-10-CM

## 2018-06-25 DIAGNOSIS — Z86711 Personal history of pulmonary embolism: Secondary | ICD-10-CM

## 2018-06-25 NOTE — Progress Notes (Signed)
Virtual Visit via Video Note   This visit type was conducted due to national recommendations for restrictions regarding the COVID-19 Pandemic (e.g. social distancing) in an effort to limit this patient's exposure and mitigate transmission in our community.  Due to his co-morbid illnesses, this patient is at least at moderate risk for complications without adequate follow up.  This format is felt to be most appropriate for this patient at this time.  All issues noted in this document were discussed and addressed.  A limited physical exam was performed with this format.  Please refer to the patient's chart for his consent to telehealth for [facility]. Evaluation Performed:  Follow-up visit  This visit type was conducted due to national recommendations for restrictions regarding the COVID-19 Pandemic (e.g. social distancing).  This format is felt to be most appropriate for this patient at this time.  All issues noted in this document were discussed and addressed.  No physical exam was performed (except for noted visual exam findings with Video Visits).  Please refer to the patient's chart (MyChart message for video visits and phone note for telephone visits) for the patient's consent to telehealth for [facility]. _____________   Date:  06/25/2018   Patient ID:  Richard [name], DOB 11/09/1960, MRN [id number] Patient Location:  Home Provider location:   Office  Primary Care Provider:  Jearld Fenton, NP Primary Cardiologist:  Kathlyn Sacramento, MD  Chief Complaint    58 year old male with a history of abnormal stress test and normal coronary arteries on catheterization 2015, hypertension, obesity, paroxysmal atrial fibrillation, left lower extremity DVT and bilateral PE now on chronic Eliquis, sleep apnea, and arthritis, who presents for follow-up related to recent ER visit for chest pain.  Past Medical History    Past Medical History:  Diagnosis Date  . Arthritis   . Arthrofibrosis  of total knee arthroplasty ([place]) 05/30/2015  . Bilateral pulmonary embolism ([place])    a. 03/2015 CTA: acute bilat PE; b. IVC filter placed in 2017--> removed 2018.  Marland Kitchen Chest pain    a. 2015 Abnl MV;  b. 2015 Cath: nl cors.  . Chronic diastolic CHF (congestive heart failure) ([place])    a. 03/2015 Echo: EF 55-65%, poor windows, mildly dil RV.  Marland Kitchen DVT (deep venous thrombosis) ([place])    a. 03/2015 U/S: occlusive DVT w/in the distal aspect of the Left fem vein through the L popliteal vein.  Marland Kitchen GERD (gastroesophageal reflux disease)   . Headache   . Hypertensive heart disease   . Obesity   . OSA (obstructive sleep apnea)    uses CPAP nightly  . PAF (paroxysmal atrial fibrillation) ([place])   . Primary localized osteoarthritis of left knee    a. 11/2014 s/p L TKA.  . Primary localized osteoarthritis of right knee    a. 02/2014 s/p R Partial Knee arthroplasty.  . Stiffness of left knee    a. 12/2014 s/p manipulation under anesthesia.  . Urine incontinence    Past Surgical History:  Procedure Laterality Date  . APPENDECTOMY    . CARDIAC CATHETERIZATION    . CARPAL TUNNEL RELEASE Left 03/2018  . EXAM UNDER ANESTHESIA WITH MANIPULATION OF KNEE Left 05/30/2015   Procedure: EXAM UNDER ANESTHESIA WITH MANIPULATION OF LEFT KNEE;  Surgeon: Marchia Bond, MD;  Location: [place];  Service: Orthopedics;  Laterality: Left;  . I&D KNEE WITH POLY EXCHANGE Left 10/30/2015   Procedure: IRRIGATION AND DEBRIDEMENT KNEE WITH POLY EXCHANGE PLACE SPACERS;  Surgeon: Pilar Plate  Mayer Camel, MD;  Location: [place];  Service: Orthopedics;  Laterality: Left;  OFF ELIQUIST 3 DAYS  . IVC FILTER PLACEMENT ([facility] HX)  04/13/15  . IVC FILTER REMOVAL N/A 08/13/2016   Procedure: IVC Filter Removal;  Surgeon: Katha Cabal, MD;  Location: [place] [facility];  Service: Cardiovascular;  Laterality: N/A;  . JOINT REPLACEMENT    . KNEE ARTHROSCOPY Left 08/08/2015   Procedure: LEFT KNEE MANIPULATION WITH ARTHROSCOPIC LYSIS OF ADHESIONS AND ASPIRATION;   Surgeon: Marchia Bond, MD;  Location: [place];  Service: Orthopedics;  Laterality: Left;  . KNEE CLOSED REDUCTION Left 01/20/2015   Procedure: LEFT KNEE MANIPULATION;  Surgeon: Marchia Bond, MD;  Location: [place];  Service: Orthopedics;  Laterality: Left;  . LEFT HEART CATHETERIZATION WITH CORONARY ANGIOGRAM N/A 01/12/2014   Procedure: LEFT HEART CATHETERIZATION WITH CORONARY ANGIOGRAM;  Surgeon: Jettie Booze, MD;  Location: [facility] CATH LAB;  Service: Cardiovascular;  Laterality: N/A;  . ORTHOPEDIC SURGERY Left    arthroscopy x3  . PARTIAL KNEE ARTHROPLASTY Right 02/25/2014   Procedure: RIGHT KNEE ARTHROPLASTY CONDYLE AND PLATEAU MEDIAL COMPARTMENT ;  Surgeon: Johnny Bridge, MD;  Location: [place];  Service: Orthopedics;  Laterality: Right;  . PERIPHERAL VASCULAR CATHETERIZATION N/A 03/29/2015   Procedure: IVC Filter Insertion, and possible thrombectomy;  Surgeon: Katha Cabal, MD;  Location: [place] [facility];  Service: Cardiovascular;  Laterality: N/A;  . ROTATOR CUFF REPAIR Left   . TOTAL KNEE ARTHROPLASTY  12/06/2014   Procedure: TOTAL KNEE ARTHROPLASTY;  Surgeon: Marchia Bond, MD;  Location: [place];  Service: Orthopedics;;  . TOTAL KNEE REVISION Left 02/08/2016   Procedure: TOTAL KNEE REVISION;  Surgeon: Frederik Pear, MD;  Location: [place];  Service: Orthopedics;  Laterality: Left;    Allergies  Allergies  Allergen Reactions  . Penicillin G Other (See Comments)    From childhood; uncertain of reaction Has patient had a PCN reaction causing immediate rash, facial/tongue/throat swelling, SOB or lightheadedness with hypotension: Unk Has patient had a PCN reaction causing severe rash involving mucus membranes or skin necrosis: Unk Has patient had a PCN reaction that required hospitalization: Unk Has patient had a PCN reaction occurring within the last 10 years: No If all of the above answers are "NO", then may proceed with Cephalosporin use.  Marland Kitchen  Penicillins Anaphylaxis and Other (See Comments)    From childhood; uncertain of reaction Has patient had a PCN reaction causing immediate rash, facial/tongue/throat swelling, SOB or lightheadedness with hypotension: Unk Has patient had a PCN reaction causing severe rash involving mucus membranes or skin necrosis: Unk Has patient had a PCN reaction that required hospitalization: Unk Has patient had a PCN reaction occurring within the last 10 years: No If all of the above answers are "NO", then may proceed with Cephalosporin use.  . Vancomycin Other (See Comments)    [place] Mans' syndrome    History of Present Illness    Richard [name] is a 58 y.o. male who presents via audio/video conferencing for a telehealth visit today.  As above, he has a history of hypertension, diastolic dysfunction, obesity, sleep apnea, arthritis, DVT following knee surgery complicated by bilateral PE and atrial fibrillation-now on chronic Eliquis.  He also has a history of abnormal stress testing with normal coronary arteries by catheterization 2015.  From an A. fib standpoint, he has been maintained on flecainide and Eliquis.  He had recurrent A. fib in December 2018 for which he went to  the emergency room was treated with IV metoprolol with conversion to sinus rhythm.  He was last seen in clinic in November 2019, at which time he was doing well on flecainide and Toprol-XL 25 mg daily.  Prior to that visit, his Toprol dose had to be reduced secondary to bradycardia.  He had been doing well but presented to the emergency department on May 19 secondary to chest pain that was reproducible with palpation.  A CT angios of the chest was performed and was negative for PE.  It was felt that he had costochondritis and was subsequently discharged home with Norco.  Since his ER visit, he has cont to have chest wall pain and tenderness. It occurs along his right lower sternal border and is worse with deep breathing and palpation.  Tylenol reduces the discomfort enough for him to be able to sleep.  He is no longer using norco.  He has not had any associated symptoms and denies palpitations, dyspnea, pnd, orthopnea, n, v, dizziness, syncope, edema, or early satiety.  The patient does not have symptoms concerning for COVID-19 infection (fever, chills, cough, or new shortness of breath).   Home Medications    Prior to Admission medications   Medication Sig Start Date End Date Taking? Authorizing Provider  acetaminophen (TYLENOL) 500 MG tablet Take 1,000 mg by mouth every 6 (six) hours as needed for moderate pain.   Yes [provider]  apixaban (ELIQUIS) 5 MG TABS tablet TAKE 1 TABLET (5 MG TOTAL) BY MOUTH 2 (TWO) TIMES DAILY. 02/02/18  Yes Wellington Hampshire, MD  baclofen (LIORESAL) 10 MG tablet TAKE 1-2 TABLETS (10-20 MG TOTAL) BY MOUTH 3 (THREE) TIMES DAILY AS NEEDED FOR MUSCLE SPASMS. 05/06/16  Yes Jearld Fenton, NP  flecainide (TAMBOCOR) 100 MG tablet Take 1 tablet (100 mg total) by mouth 2 (two) times daily. 03/11/18  Yes Wellington Hampshire, MD  furosemide (LASIX) 40 MG tablet TAKE 1 TABLET (40 MG TOTAL) BY MOUTH DAILY AS NEEDED. MUST SCHEDULE ANNUAL EXAM 02/10/18  Yes Jearld Fenton, NP  gabapentin (NEURONTIN) 100 MG capsule Take 1 capsule (100 mg total) by mouth daily as needed. Patient taking differently: Take 100 mg by mouth daily as needed (for hand pain).  12/20/16  Yes Jearld Fenton, NP  HYDROcodone-acetaminophen (NORCO/VICODIN) 5-325 MG tablet Take 1 tablet by mouth every 6 (six) hours as needed for moderate pain. 06/09/18  Yes Nena Polio, MD  Sennosides-Docusate Sodium (SM STOOL SOFTENER/LAXATIVE PO) Take 1 tablet by mouth at bedtime.   Yes [provider]  traMADol (ULTRAM) 50 MG tablet TAKE 2 TABLETS BY MOUTH EVERY NIGHT AT BEDTIME AS NEEDED 04/14/18  Yes Baity, Coralie Keens, NP  metoprolol succinate (TOPROL-XL) 25 MG 24 hr tablet Take 1 tablet (25 mg total) by mouth daily. Take with or  immediately following a meal. 11/25/17 06/23/18  Wellington Hampshire, MD    Review of Systems    Chest wall pain as outlined above.  He denies palpitations, dyspnea, pnd, orthopnea, n, v, dizziness, syncope, edema, weight gain, or early satiety.  All other systems reviewed and are otherwise negative except as noted above.  Physical Exam    Vital Signs:  BP 124/76 (BP Location: Right Arm, Patient Position: Sitting, Cuff Size: Normal)   Pulse 62   Temp 98 F (36.7 C) (Oral)   Wt (!) 311 lb (141.1 kg)   SpO2 98%   BMI 45.93 kg/m    Obese ? in no acute  distress.  Pleasant, AAOx3. Resp reg and unlabored.  Accessory Clinical Findings    Lab Results  Component Value Date   WBC 9.5 06/23/2018   HGB 15.1 06/23/2018   HCT 46.0 06/23/2018   MCV 88.0 06/23/2018   PLT 195.0 06/23/2018   Lab Results  Component Value Date   CREATININE 1.27 06/23/2018   BUN 18 06/23/2018   NA 139 06/23/2018   K 4.7 06/23/2018   CL 103 06/23/2018   CO2 31 06/23/2018   Lab Results  Component Value Date   CHOL 164 06/23/2018   HDL 40.20 06/23/2018   LDLCALC 106 (H) 06/23/2018   TRIG 91.0 06/23/2018   CHOLHDL 4 06/23/2018    Lab Results  Component Value Date   TSH 1.65 11/05/2016    Lab Results  Component Value Date   HGBA1C 5.7 06/23/2018    Assessment & Plan    1.  Costochondritis:  Recently eval in the ED w/ chest wall pain and tenderness.  There is a pleuritic component, as Ss worse w/ deep breathing, however, he clearly notes tenderness along his right lower sternal border.  CTA in the ED neg for PE.  No significant findings otherwise to explain his Ss.  I offered reassurance.  We did discuss short term usage of an nsaid w/ PPI therapy to see if that will help w/ inflammation and pain.  He will try aleve 1 tab BID w/ prilosec OTC 20mg  daily x 7 days. I advised that he should stop after a week and not plan to continue indefinitely in the setting of chronic oral anticoagulation and he was agreeable  with that.  2.  PAF:  No recent palpitations.  He remains on  blocker and flecainide.  He cont to watch his HRs and has not had any rates in the 40's.  Cont eliquis.  CBC and renal fxn stable on 6/2.  3.  H/o DVT/PE:  Lifelong eliquis.  Recent CTA neg for PE.  CBC/renal fxn stable.  4.  Morbid obesity:  He recognizes that he needs to increase activity and reduce caloric intake.  Motivation lacking.  He may benefit from bariatric clinic referral.  5.  HFpEF:  No significant dyspnea or edema.  HR/BP stable.  Cont prn lasix.  6.  Essential HTN:  Stable.  7.  Dispo:  F/u in 6 mos or sooner if necessary.  COVID-19 Education: The signs and symptoms of COVID-19 were discussed with the patient and how to seek care for testing (follow up with PCP or arrange E-visit).  The importance of social distancing was discussed today.  Patient Risk:   After full review of this patient's history and clinical status, I feel that he is at least moderate risk for cardiac complications at this time, thus necessitating a telehealth visit sooner than our first available in office visit.  Time:   Today, I have spent 15 minutes with the patient with telehealth technology discussing medical history, symptoms, and management plan.     Murray Hodgkins, NP 06/25/2018, 3:22 PM

## 2018-06-25 NOTE — Patient Instructions (Signed)
It was a pleasure to speak with you on the phone today! Thank you for allowing [place] to continue taking care of your [facility] needs during this time.   Feel free to call as needed for questions and concerns related to your cardiac needs.   Medication Instructions:  Your physician recommends that you continue on your current medications as directed. Please refer to the Current Medication list given to you today. 1- Take Aleeve twice daily for 1 week for diagnosis of costochondritis. 2- Take Prilosec once daily for 1 week for diagnosis of costochondritis (will alleviate symptoms of Aleeve use).  If you need a refill on your cardiac medications before your next appointment, please call your pharmacy.   Lab work: None ordered  If you have labs (blood work) drawn today and your tests are completely normal, you will receive your results only by: Marland Kitchen MyChart Message (if you have MyChart) OR . A paper copy in the mail If you have any lab test that is abnormal or we need to change your treatment, we will call you to review the results.  Testing/Procedures: None ordered   Follow-Up: At [facility], you and your health needs are our priority.  As part of our continuing mission to provide you with exceptional heart care, we have created designated Provider Care Teams.  These Care Teams include your primary Cardiologist (physician) and Advanced Practice Providers (APPs -  Physician Assistants and Nurse Practitioners) who all work together to provide you with the care you need, when you need it. You will need a follow up appointment in 6 months.  Please call our office 2 months in advance to schedule this appointment.  You may see Richard Sacramento, MD or Richard Hodgkins, NP.

## 2018-06-29 ENCOUNTER — Telehealth: Payer: Self-pay

## 2018-06-29 NOTE — Telephone Encounter (Signed)
Pt left v/m requesting cb about lab results on lab work done on 06/23/18.

## 2018-07-04 DIAGNOSIS — G4733 Obstructive sleep apnea (adult) (pediatric): Secondary | ICD-10-CM | POA: Diagnosis not present

## 2018-07-09 NOTE — Telephone Encounter (Signed)
See lab result note.

## 2018-07-15 DIAGNOSIS — M6530 Trigger finger, unspecified finger: Secondary | ICD-10-CM | POA: Diagnosis not present

## 2018-09-10 ENCOUNTER — Other Ambulatory Visit: Payer: Self-pay | Admitting: Cardiovascular Disease

## 2018-09-14 ENCOUNTER — Other Ambulatory Visit: Payer: Self-pay | Admitting: Surgery

## 2018-09-22 ENCOUNTER — Ambulatory Visit
Admission: RE | Admit: 2018-09-22 | Discharge: 2018-09-22 | Disposition: A | Discharge status: Home / Self Care | Payer: PRIVATE HEALTH INSURANCE | Source: Ambulatory Visit | Attending: Surgery | Admitting: Surgery

## 2018-09-22 ENCOUNTER — Other Ambulatory Visit: Payer: Self-pay

## 2018-09-22 DIAGNOSIS — Z8679 Personal history of other diseases of the circulatory system: Secondary | ICD-10-CM | POA: Diagnosis not present

## 2018-09-22 DIAGNOSIS — Z01818 Encounter for other preprocedural examination: Secondary | ICD-10-CM | POA: Diagnosis not present

## 2018-09-22 DIAGNOSIS — R9431 Abnormal electrocardiogram [ECG] [EKG]: Secondary | ICD-10-CM | POA: Diagnosis not present

## 2018-09-22 DIAGNOSIS — K3189 Other diseases of stomach and duodenum: Secondary | ICD-10-CM | POA: Diagnosis not present

## 2018-09-23 ENCOUNTER — Telehealth: Payer: Self-pay | Admitting: Internal Medicine

## 2018-09-23 NOTE — Telephone Encounter (Signed)
Best number [phone]  [place] [facility] faxed form Pt wanted to make sure you received this Pt scheduled appointment 9/8

## 2018-09-29 ENCOUNTER — Ambulatory Visit: Payer: PRIVATE HEALTH INSURANCE | Admitting: Internal Medicine

## 2018-10-01 ENCOUNTER — Encounter: Payer: Self-pay | Admitting: Internal Medicine

## 2018-10-01 ENCOUNTER — Ambulatory Visit (INDEPENDENT_AMBULATORY_CARE_PROVIDER_SITE_OTHER): Payer: PPO | Admitting: Internal Medicine

## 2018-10-01 ENCOUNTER — Other Ambulatory Visit: Payer: Self-pay

## 2018-10-01 VITALS — BP 130/76 | HR 78 | Temp 98.2°F | Wt 310.0 lb

## 2018-10-01 DIAGNOSIS — Z23 Encounter for immunization: Secondary | ICD-10-CM

## 2018-10-01 DIAGNOSIS — Z6841 Body Mass Index (BMI) 40.0 and over, adult: Secondary | ICD-10-CM

## 2018-10-01 NOTE — Progress Notes (Signed)
Subjective:    Patient ID: Richard [name], male    DOB: 10/07/1960, 58 y.o.   MRN: [id number]  HPI  Pt presents to the clinic today for a weight check. He has decided to proceed with a gastric sleeve with [place] surgery. His weight today is 310 lbs with a BMI of 45.78. Co morbidities include HTN, CHF, and chronic bilateral knee pain.  Breakfast: egg/bacon sandwich, cereal, toast or oatmeal Lunch: sandwich or hot dog Dinner: grilled chicken , veggies, starch Snacks: cookies Exercise: None  Review of Systems      Past Medical History:  Diagnosis Date  . Arthritis   . Arthrofibrosis of total knee arthroplasty ([place]) 05/30/2015  . Bilateral pulmonary embolism ([place])    a. 03/2015 CTA: acute bilat PE; b. IVC filter placed in 2017--> removed 2018.  Marland Kitchen Chest pain    a. 2015 Abnl MV;  b. 2015 Cath: nl cors.  . Chronic diastolic CHF (congestive heart failure) ([place])    a. 03/2015 Echo: EF 55-65%, poor windows, mildly dil RV.  Marland Kitchen DVT (deep venous thrombosis) ([place])    a. 03/2015 U/S: occlusive DVT w/in the distal aspect of the Left fem vein through the L popliteal vein.  Marland Kitchen GERD (gastroesophageal reflux disease)   . Headache   . Hypertensive heart disease   . Obesity   . OSA (obstructive sleep apnea)    uses CPAP nightly  . PAF (paroxysmal atrial fibrillation) ([place])   . Primary localized osteoarthritis of left knee    a. 11/2014 s/p L TKA.  . Primary localized osteoarthritis of right knee    a. 02/2014 s/p R Partial Knee arthroplasty.  . Stiffness of left knee    a. 12/2014 s/p manipulation under anesthesia.  . Urine incontinence     Current Outpatient Medications  Medication Sig Dispense Refill  . acetaminophen (TYLENOL) 500 MG tablet Take 1,000 mg by mouth every 6 (six) hours as needed for moderate pain.    Marland Kitchen apixaban (ELIQUIS) 5 MG TABS tablet TAKE 1 TABLET (5 MG TOTAL) BY MOUTH 2 (TWO) TIMES DAILY. 60 tablet 6  . baclofen (LIORESAL) 10 MG tablet TAKE 1-2 TABLETS (10-20 MG  TOTAL) BY MOUTH 3 (THREE) TIMES DAILY AS NEEDED FOR MUSCLE SPASMS. 180 tablet 0  . flecainide (TAMBOCOR) 100 MG tablet Take 1 tablet (100 mg total) by mouth 2 (two) times daily. 180 tablet 2  . furosemide (LASIX) 40 MG tablet TAKE 1 TABLET (40 MG TOTAL) BY MOUTH DAILY AS NEEDED. MUST SCHEDULE ANNUAL EXAM 30 tablet 0  . gabapentin (NEURONTIN) 100 MG capsule Take 1 capsule (100 mg total) by mouth daily as needed. (Patient taking differently: Take 100 mg by mouth daily as needed (for hand pain). ) 30 capsule 2  . HYDROcodone-acetaminophen (NORCO/VICODIN) 5-325 MG tablet Take 1 tablet by mouth every 6 (six) hours as needed for moderate pain. 15 tablet 0  . metoprolol succinate (TOPROL-XL) 25 MG 24 hr tablet TAKE 1 TABLET BY MOUTH DAILY WITH OR IMMEDIATELY FOLLOWING A MEAL 90 tablet 3  . Sennosides-Docusate Sodium (SM STOOL SOFTENER/LAXATIVE PO) Take 1 tablet by mouth at bedtime.    . traMADol (ULTRAM) 50 MG tablet TAKE 2 TABLETS BY MOUTH EVERY NIGHT AT BEDTIME AS NEEDED 60 tablet 0   Current Facility-Administered Medications  Medication Dose Route Frequency Provider Last Rate Last Dose  . cyanocobalamin ((VITAMIN B-12)) injection 1,000 mcg  1,000 mcg Intramuscular Q14 Days Jearld Fenton, NP   1,000 mcg at 04/08/18  [id number]    Allergies  Allergen Reactions  . Penicillin G Other (See Comments)    From childhood; uncertain of reaction Has patient had a PCN reaction causing immediate rash, facial/tongue/throat swelling, SOB or lightheadedness with hypotension: Unk Has patient had a PCN reaction causing severe rash involving mucus membranes or skin necrosis: Unk Has patient had a PCN reaction that required hospitalization: Unk Has patient had a PCN reaction occurring within the last 10 years: No If all of the above answers are "NO", then may proceed with Cephalosporin use.  Marland Kitchen Penicillins Anaphylaxis and Other (See Comments)    From childhood; uncertain of reaction Has patient had a PCN reaction causing  immediate rash, facial/tongue/throat swelling, SOB or lightheadedness with hypotension: Unk Has patient had a PCN reaction causing severe rash involving mucus membranes or skin necrosis: Unk Has patient had a PCN reaction that required hospitalization: Unk Has patient had a PCN reaction occurring within the last 10 years: No If all of the above answers are "NO", then may proceed with Cephalosporin use.  . Vancomycin Other (See Comments)    [place]' syndrome    Family History  Problem Relation Age of Onset  . Coronary artery disease Mother   . Hyperlipidemia Mother   . Hypertension Mother   . Arthritis Mother   . Coronary artery disease Father   . Heart disease Paternal Grandmother   . Alzheimer's disease Paternal Grandfather     Social History   Socioeconomic History  . Marital status: Married    Spouse name: Not on file  . Number of children: Not on file  . Years of education: Not on file  . Highest education level: Not on file  Occupational History  . Occupation: Energy manager: [employer]  Social Needs  . Financial resource strain: Not on file  . Food insecurity    Worry: Not on file    Inability: Not on file  . Transportation needs    Medical: Not on file    Non-medical: Not on file  Tobacco Use  . Smoking status: Never Smoker  . Smokeless tobacco: Never Used  Substance and Sexual Activity  . Alcohol use: Yes    Comment: occassional  . Drug use: No  . Sexual activity: Yes  Lifestyle  . Physical activity    Days per week: Not on file    Minutes per session: Not on file  . Stress: Not on file  Relationships  . Social Herbalist on phone: Not on file    Gets together: Not on file    Attends religious service: Not on file    Active member of club or organization: Not on file    Attends meetings of clubs or organizations: Not on file    Relationship status: Not on file  . Intimate partner violence    Fear of current or ex partner: Not on file     Emotionally abused: Not on file    Physically abused: Not on file    Forced sexual activity: Not on file  Other Topics Concern  . Not on file  Social History Narrative  . Not on file     Constitutional: Pt reports abnormal weight gain. Denies fever, malaise, fatigue, headache.  Respiratory: Denies difficulty breathing, shortness of breath, cough or sputum production.   Cardiovascular: Denies chest pain, chest tightness, palpitations or swelling in the hands or feet.  Gastrointestinal: Pt reports reflux. Denies abdominal pain, bloating, constipation,  diarrhea or blood in the stool.  Musculoskeletal: Pt reports bilateral knee pain. Denies decrease in range of motion, difficulty with gait, muscle pain or joint swelling.   No other specific complaints in a complete review of systems (except as listed in HPI above).  Objective:   Physical Exam    BP 130/76   Pulse 78   Temp 98.2 F (36.8 C) (Temporal)   Wt (!) 310 lb (140.6 kg)   SpO2 97%   BMI 45.78 kg/m  Wt Readings from Last 3 Encounters:  10/01/18 (!) 310 lb (140.6 kg)  06/25/18 (!) 311 lb (141.1 kg)  06/23/18 (!) 311 lb (141.1 kg)    General: Appears his  stated age, obese, in NAD. Cardiovascular: Normal rate and rhythm. S1,S2 noted.  No murmur, rubs or gallops noted.. Pulmonary/Chest: Normal effort and positive vesicular breath sounds. No respiratory distress. No wheezes, rales or ronchi noted.  Abdomen: Soft and nontender.  Musculoskeletal: Gait slow and steady without device. Neurological: Alert and oriented.   BMET    Component Value Date/Time   NA 139 06/23/2018 1545   NA 141 10/31/2017 1438   K 4.7 06/23/2018 1545   CL 103 06/23/2018 1545   CO2 31 06/23/2018 1545   GLUCOSE 80 06/23/2018 1545   BUN 18 06/23/2018 1545   BUN 18 10/31/2017 1438   CREATININE 1.27 06/23/2018 1545   CREATININE 1.27 12/06/2015 1600   CALCIUM 9.2 06/23/2018 1545   GFRNONAA 54 (L) 06/09/2018 2115   GFRAA >60 06/09/2018 2115     Lipid Panel     Component Value Date/Time   CHOL 164 06/23/2018 1545   CHOL 170 10/31/2017 1438   TRIG 91.0 06/23/2018 1545   HDL 40.20 06/23/2018 1545   HDL 36 (L) 10/31/2017 1438   CHOLHDL 4 06/23/2018 1545   VLDL 18.2 06/23/2018 1545   LDLCALC 106 (H) 06/23/2018 1545   LDLCALC 108 (H) 10/31/2017 1438    CBC    Component Value Date/Time   WBC 9.5 06/23/2018 1545   RBC 5.23 06/23/2018 1545   HGB 15.1 06/23/2018 1545   HGB 14.0 10/31/2017 1438   HCT 46.0 06/23/2018 1545   HCT 43.5 10/31/2017 1438   PLT 195.0 06/23/2018 1545   PLT 220 10/31/2017 1438   MCV 88.0 06/23/2018 1545   MCV 83 10/31/2017 1438   MCH 27.9 06/09/2018 2115   MCHC 32.8 06/23/2018 1545   RDW 15.4 06/23/2018 1545   RDW 14.4 10/31/2017 1438   LYMPHSABS 2.3 06/09/2018 2115   LYMPHSABS 1.6 10/31/2017 1438   MONOABS 0.8 06/09/2018 2115   EOSABS 0.3 06/09/2018 2115   EOSABS 0.2 10/31/2017 1438   BASOSABS 0.0 06/09/2018 2115   BASOSABS 0.0 10/31/2017 1438    Hgb A1C Lab Results  Component Value Date   HGBA1C 5.7 06/23/2018          Assessment & Plan:   Obesity:  Discussed low carb diet, more fruits and veggies, less starch Encouraged exercise for 90 minutes per week Encouraged him to keep a log of his caloric intake and bring it to his next visit  RTC in 1 month for weight check Webb Silversmith, NP

## 2018-10-04 DIAGNOSIS — G4733 Obstructive sleep apnea (adult) (pediatric): Secondary | ICD-10-CM | POA: Diagnosis not present

## 2018-10-09 ENCOUNTER — Encounter: Payer: Self-pay | Admitting: Internal Medicine

## 2018-10-09 NOTE — Patient Instructions (Signed)
° °Calorie Counting for Weight Loss °Calories are units of energy. Your body needs a certain amount of calories from food to keep you going throughout the day. When you eat more calories than your body needs, your body stores the extra calories as fat. When you eat fewer calories than your body needs, your body burns fat to get the energy it needs. °Calorie counting means keeping track of how many calories you eat and drink each day. Calorie counting can be helpful if you need to lose weight. If you make sure to eat fewer calories than your body needs, you should lose weight. Ask your health care provider what a healthy weight is for you. °For calorie counting to work, you will need to eat the right number of calories in a day in order to lose a healthy amount of weight per week. A dietitian can help you determine how many calories you need in a day and will give you suggestions on how to reach your calorie goal. °· A healthy amount of weight to lose per week is usually 1-2 lb (0.5-0.9 kg). This usually means that your daily calorie intake should be reduced by 500-750 calories. °· Eating 1,200 - 1,500 calories per day can help most women lose weight. °· Eating 1,500 - 1,800 calories per day can help most men lose weight. °What is my plan? °My goal is to have __________ calories per day. °If I have this many calories per day, I should lose around __________ pounds per week. °What do I need to know about calorie counting? °In order to meet your daily calorie goal, you will need to: °· Find out how many calories are in each food you would like to eat. Try to do this before you eat. °· Decide how much of the food you plan to eat. °· Write down what you ate and how many calories it had. Doing this is called keeping a food log. °To successfully lose weight, it is important to balance calorie counting with a healthy lifestyle that includes regular activity. Aim for 150 minutes of moderate exercise (such as walking) or 75  minutes of vigorous exercise (such as running) each week. °Where do I find calorie information? ° °The number of calories in a food can be found on a Nutrition Facts label. If a food does not have a Nutrition Facts label, try to look up the calories online or ask your dietitian for help. °Remember that calories are listed per serving. If you choose to have more than one serving of a food, you will have to multiply the calories per serving by the amount of servings you plan to eat. For example, the label on a package of bread might say that a serving size is 1 slice and that there are 90 calories in a serving. If you eat 1 slice, you will have eaten 90 calories. If you eat 2 slices, you will have eaten 180 calories. °How do I keep a food log? °Immediately after each meal, record the following information in your food log: °· What you ate. Don't forget to include toppings, sauces, and other extras on the food. °· How much you ate. This can be measured in cups, ounces, or number of items. °· How many calories each food and drink had. °· The total number of calories in the meal. °Keep your food log near you, such as in a small notebook in your pocket, or use a mobile app or website. Some programs will   calculate calories for you and show you how many calories you have left for the day to meet your goal. °What are some calorie counting tips? ° °· Use your calories on foods and drinks that will fill you up and not leave you hungry: °? Some examples of foods that fill you up are nuts and nut butters, vegetables, lean proteins, and high-fiber foods like whole grains. High-fiber foods are foods with more than 5 g fiber per serving. °? Drinks such as sodas, specialty coffee drinks, alcohol, and juices have a lot of calories, yet do not fill you up. °· Eat nutritious foods and avoid empty calories. Empty calories are calories you get from foods or beverages that do not have many vitamins or protein, such as candy, sweets, and  soda. It is better to have a nutritious high-calorie food (such as an avocado) than a food with few nutrients (such as a bag of chips). °· Know how many calories are in the foods you eat most often. This will help you calculate calorie counts faster. °· Pay attention to calories in drinks. Low-calorie drinks include water and unsweetened drinks. °· Pay attention to nutrition labels for "low fat" or "fat free" foods. These foods sometimes have the same amount of calories or more calories than the full fat versions. They also often have added sugar, starch, or salt, to make up for flavor that was removed with the fat. °· Find a way of tracking calories that works for you. Get creative. Try different apps or programs if writing down calories does not work for you. °What are some portion control tips? °· Know how many calories are in a serving. This will help you know how many servings of a certain food you can have. °· Use a measuring cup to measure serving sizes. You could also try weighing out portions on a kitchen scale. With time, you will be able to estimate serving sizes for some foods. °· Take some time to put servings of different foods on your favorite plates, bowls, and cups so you know what a serving looks like. °· Try not to eat straight from a bag or box. Doing this can lead to overeating. Put the amount you would like to eat in a cup or on a plate to make sure you are eating the right portion. °· Use smaller plates, glasses, and bowls to prevent overeating. °· Try not to multitask (for example, watch TV or use your computer) while eating. If it is time to eat, sit down at a table and enjoy your food. This will help you to know when you are full. It will also help you to be aware of what you are eating and how much you are eating. °What are tips for following this plan? °Reading food labels °· Check the calorie count compared to the serving size. The serving size may be smaller than what you are used to  eating. °· Check the source of the calories. Make sure the food you are eating is high in vitamins and protein and low in saturated and trans fats. °Shopping °· Read nutrition labels while you shop. This will help you make healthy decisions before you decide to purchase your food. °· Make a grocery list and stick to it. °Cooking °· Try to cook your favorite foods in a healthier way. For example, try baking instead of frying. °· Use low-fat dairy products. °Meal planning °· Use more fruits and vegetables. Half of your plate should be   fruits and vegetables. °· Include lean proteins like poultry and fish. °How do I count calories when eating out? °· Ask for smaller portion sizes. °· Consider sharing an entree and sides instead of getting your own entree. °· If you get your own entree, eat only half. Ask for a box at the beginning of your meal and put the rest of your entree in it so you are not tempted to eat it. °· If calories are listed on the menu, choose the lower calorie options. °· Choose dishes that include vegetables, fruits, whole grains, low-fat dairy products, and lean protein. °· Choose items that are boiled, broiled, grilled, or steamed. Stay away from items that are buttered, battered, fried, or served with cream sauce. Items labeled "crispy" are usually fried, unless stated otherwise. °· Choose water, low-fat milk, unsweetened iced tea, or other drinks without added sugar. If you want an alcoholic beverage, choose a lower calorie option such as a glass of wine or light beer. °· Ask for dressings, sauces, and syrups on the side. These are usually high in calories, so you should limit the amount you eat. °· If you want a salad, choose a garden salad and ask for grilled meats. Avoid extra toppings like bacon, cheese, or fried items. Ask for the dressing on the side, or ask for olive oil and vinegar or lemon to use as dressing. °· Estimate how many servings of a food you are given. For example, a serving of  cooked rice is ½ cup or about the size of half a baseball. Knowing serving sizes will help you be aware of how much food you are eating at restaurants. The list below tells you how big or small some common portion sizes are based on everyday objects: °? 1 oz--4 stacked dice. °? 3 oz--1 deck of cards. °? 1 tsp--1 die. °? 1 Tbsp--½ a ping-pong ball. °? 2 Tbsp--1 ping-pong ball. °? ½ cup--½ baseball. °? 1 cup--1 baseball. °Summary °· Calorie counting means keeping track of how many calories you eat and drink each day. If you eat fewer calories than your body needs, you should lose weight. °· A healthy amount of weight to lose per week is usually 1-2 lb (0.5-0.9 kg). This usually means reducing your daily calorie intake by 500-750 calories. °· The number of calories in a food can be found on a Nutrition Facts label. If a food does not have a Nutrition Facts label, try to look up the calories online or ask your dietitian for help. °· Use your calories on foods and drinks that will fill you up, and not on foods and drinks that will leave you hungry. °· Use smaller plates, glasses, and bowls to prevent overeating. °This information is not intended to replace advice given to you by your health care provider. Make sure you discuss any questions you have with your health care provider. °Document Released: 01/07/2005 Document Revised: 09/26/2017 Document Reviewed: 12/08/2015 °Elsevier Patient Education © 2020 [employer] ° °

## 2018-10-29 ENCOUNTER — Other Ambulatory Visit: Payer: Self-pay

## 2018-10-29 ENCOUNTER — Encounter: Payer: Self-pay | Admitting: Dietician

## 2018-10-29 ENCOUNTER — Ambulatory Visit: Payer: PPO | Admitting: Internal Medicine

## 2018-10-29 ENCOUNTER — Encounter: Payer: PPO | Attending: Surgery | Admitting: Dietician

## 2018-10-29 VITALS — Ht 69.0 in | Wt 302.4 lb

## 2018-10-29 DIAGNOSIS — Z6841 Body Mass Index (BMI) 40.0 and over, adult: Secondary | ICD-10-CM | POA: Diagnosis not present

## 2018-10-29 DIAGNOSIS — G4730 Sleep apnea, unspecified: Secondary | ICD-10-CM | POA: Diagnosis not present

## 2018-10-29 NOTE — Patient Instructions (Signed)
   Make sure to relax when eating, eat slowly, and chew food thoroughly -- 20-30 times for each bite of food. This is important after surgery, and might help prevent getting choked.   Great job making healthy changes to reduce sugar and fat and starchy foods! Keep it up!

## 2018-10-29 NOTE — Progress Notes (Signed)
Nutrition Assessment  Proposed Surgery: Sleeve gastrectomy   Height: 5'9" Weight: 302.4lbs BMI: 44.66 Upper IBW% (UIBW): 172% ([id number])  Patient's Goal Weight: 250lbs  Medical History: GERD (reports occasionally feeling like food gets stuck in esophagus); sleep apnea (uses CPAP) Medications and Supplements: apixaban, baclofen prn, flecainide, furosemide, gabapentin prn, metoprolol, traMADol  Previous surgeries: multiple knee surgeries, carpal tunnel, shoulder, appendectomy Drug allergies: penicillins, vancomycin Food allergies: none known Alcohol use: none currently  Tobacco use: none  Physical activity: walking on treadmill 60 minutes, 3 times a week  Weight history: Childhood: normal     Adolescence: normal; physically active in sports    Adulthood: 227lbs when entering [place], 180lbs when out; then gradually gained    Weight 1 year ago: 331 (prior to knee surgery) -- highest adult weight  Dieting/ weight loss history: has dieted in the past by making habit changes, no specific diet or weight loss programs. Did try meal replacement shakes but did not work well. Has historically been able to lose weight but only temporarily followed by regaining more weight. He does feel weight loss has become more difficult over the years.    Recent changes include reduced intake of sodas, sweet tea, and bread, increased vegetables and fruits, portion control, increased activity  Dietary Recall:  Daily pattern: 3 meals and 0-1 snacks. Dining out: 1 meals per week. Breakfast: usually egg and bacon with or without bread; cereal occasionally-- Frosted flakes or corn flakes Lunch: grilled chicken salad; grilled chicken sandwich Supper: grilled meats mostly poultry, occasional fried fish, occasional red meat + veg + rice/ beans/ potato Snack(s): crackers; occasional candy when having low BG symptoms Beverages: water, sugar free sports drinks, very little tea or soda  Psychosocial: Emotional  eating history: usually eats less during stressful times Disordered eating history: none  Intervention:  Patient goal is to improve heart health and leg pain  He has researched this procedure by consulted with firends who have undergone weight loss surgery.   Instructed him on pre-op diet guidelines.   Discussed stages of the bariatric diet after surgery as well as the importance of adequate protein and fluid intake.   Summary:  Patient has made significant diet and lifestyle changes to lose weight and prepare for bariatric surgery.  He has solid support from family and friends.   He agrees to work on ongoing weight loss, eating more slowly and chewing foods thoroughly prior to surgery.   He voices understanding of bariatric diet stages and guidelines, and is motivated to follow the bariatric diet after surgery. From a nutrition standpoint, she is ready to proceed with the bariatric surgery program.    Plan:  Patient commits to returning for supervised weight loss visits with his PCP, and pre-op RD, RN class prior to surgery.   He will plan to return for post-op RD visits beginning 2 weeks after surgery.

## 2018-11-02 ENCOUNTER — Ambulatory Visit (INDEPENDENT_AMBULATORY_CARE_PROVIDER_SITE_OTHER): Payer: PPO | Admitting: Internal Medicine

## 2018-11-02 ENCOUNTER — Encounter: Payer: Self-pay | Admitting: Internal Medicine

## 2018-11-02 ENCOUNTER — Other Ambulatory Visit: Payer: Self-pay

## 2018-11-02 VITALS — BP 126/76 | HR 57 | Temp 98.2°F | Wt 301.0 lb

## 2018-11-02 DIAGNOSIS — Z6841 Body Mass Index (BMI) 40.0 and over, adult: Secondary | ICD-10-CM

## 2018-11-02 DIAGNOSIS — E538 Deficiency of other specified B group vitamins: Secondary | ICD-10-CM | POA: Diagnosis not present

## 2018-11-02 MED ORDER — CYANOCOBALAMIN 1000 MCG/ML IJ SOLN
1000.0000 ug | INTRAMUSCULAR | Status: AC
  Administered 2018-11-02 – 2018-11-18 (×2): 1000 ug via INTRAMUSCULAR

## 2018-11-02 NOTE — Progress Notes (Signed)
Subjective:    Patient ID: Richard [name], male    DOB: 12/23/1960, 58 y.o.   MRN: [id number]  HPI  Pt presents to the clinic today for 1 month follow up for abnormal weight gain. He is interested in getting a gastric sleeve by [facility] surgery. His starting weight was 310 lbs with a BMI of 45.78. His weight today is 301 lbs with a BMI 44.45. He has already met with the nutritionist. He reports he has cut out soft drinks, cutting back on bread and carbs, trying to do more portion control with his meals. He reports he has also been walking more.  Breakfast: eggs, bacon Lunch: grilled chicken salad Dinner: vegetables, grilled chicken or [place] Snacks: jello, apples and oranges Exercise: 1.5 mile per day, 3-4 days per week  He would also like to restart B12 injections. His last level was 262, 12/2017  Review of Systems  Past Medical History:  Diagnosis Date  . Arthritis   . Arthrofibrosis of total knee arthroplasty ([place]) 05/30/2015  . Bilateral pulmonary embolism ([place])    a. 03/2015 CTA: acute bilat PE; b. IVC filter placed in 2017--> removed 2018.  Marland Kitchen Chest pain    a. 2015 Abnl MV;  b. 2015 Cath: nl cors.  . Chronic diastolic CHF (congestive heart failure) ([place])    a. 03/2015 Echo: EF 55-65%, poor windows, mildly dil RV.  Marland Kitchen DVT (deep venous thrombosis) ([place])    a. 03/2015 U/S: occlusive DVT w/in the distal aspect of the Left fem vein through the L popliteal vein.  Marland Kitchen GERD (gastroesophageal reflux disease)   . Headache   . Hypertensive heart disease   . Obesity   . OSA (obstructive sleep apnea)    uses CPAP nightly  . PAF (paroxysmal atrial fibrillation) ([place])   . Primary localized osteoarthritis of left knee    a. 11/2014 s/p L TKA.  . Primary localized osteoarthritis of right knee    a. 02/2014 s/p R Partial Knee arthroplasty.  . Stiffness of left knee    a. 12/2014 s/p manipulation under anesthesia.  . Urine incontinence     Current Outpatient Medications   Medication Sig Dispense Refill  . acetaminophen (TYLENOL) 500 MG tablet Take 1,000 mg by mouth every 6 (six) hours as needed for moderate pain.    Marland Kitchen apixaban (ELIQUIS) 5 MG TABS tablet TAKE 1 TABLET (5 MG TOTAL) BY MOUTH 2 (TWO) TIMES DAILY. 60 tablet 6  . baclofen (LIORESAL) 10 MG tablet TAKE 1-2 TABLETS (10-20 MG TOTAL) BY MOUTH 3 (THREE) TIMES DAILY AS NEEDED FOR MUSCLE SPASMS. 180 tablet 0  . flecainide (TAMBOCOR) 100 MG tablet Take 1 tablet (100 mg total) by mouth 2 (two) times daily. 180 tablet 2  . furosemide (LASIX) 40 MG tablet TAKE 1 TABLET (40 MG TOTAL) BY MOUTH DAILY AS NEEDED. MUST SCHEDULE ANNUAL EXAM 30 tablet 0  . gabapentin (NEURONTIN) 100 MG capsule Take 1 capsule (100 mg total) by mouth daily as needed. (Patient taking differently: Take 100 mg by mouth daily as needed (for hand pain). ) 30 capsule 2  . HYDROcodone-acetaminophen (NORCO/VICODIN) 5-325 MG tablet Take 1 tablet by mouth every 6 (six) hours as needed for moderate pain. (Patient not taking: Reported on 10/29/2018) 15 tablet 0  . metoprolol succinate (TOPROL-XL) 25 MG 24 hr tablet TAKE 1 TABLET BY MOUTH DAILY WITH OR IMMEDIATELY FOLLOWING A MEAL 90 tablet 3  . Sennosides-Docusate Sodium (SM STOOL SOFTENER/LAXATIVE PO) Take 1 tablet by mouth  at bedtime.    . traMADol (ULTRAM) 50 MG tablet TAKE 2 TABLETS BY MOUTH EVERY NIGHT AT BEDTIME AS NEEDED 60 tablet 0   Current Facility-Administered Medications  Medication Dose Route Frequency Provider Last Rate Last Dose  . cyanocobalamin ((VITAMIN B-12)) injection 1,000 mcg  1,000 mcg Intramuscular Q14 Days Jearld Fenton, NP   1,000 mcg at 04/08/18 7408    Allergies  Allergen Reactions  . Penicillin G Other (See Comments)    From childhood; uncertain of reaction Has patient had a PCN reaction causing immediate rash, facial/tongue/throat swelling, SOB or lightheadedness with hypotension: Unk Has patient had a PCN reaction causing severe rash involving mucus membranes or skin  necrosis: Unk Has patient had a PCN reaction that required hospitalization: Unk Has patient had a PCN reaction occurring within the last 10 years: No If all of the above answers are "NO", then may proceed with Cephalosporin use.  Marland Kitchen Penicillins Anaphylaxis and Other (See Comments)    From childhood; uncertain of reaction Has patient had a PCN reaction causing immediate rash, facial/tongue/throat swelling, SOB or lightheadedness with hypotension: Unk Has patient had a PCN reaction causing severe rash involving mucus membranes or skin necrosis: Unk Has patient had a PCN reaction that required hospitalization: Unk Has patient had a PCN reaction occurring within the last 10 years: No If all of the above answers are "NO", then may proceed with Cephalosporin use.  . Vancomycin Other (See Comments)    Red Mans' syndrome    Family History  Problem Relation Age of Onset  . Coronary artery disease Mother   . Hyperlipidemia Mother   . Hypertension Mother   . Arthritis Mother   . Coronary artery disease Father   . Heart disease Paternal Grandmother   . Alzheimer's disease Paternal Grandfather     Social History   Socioeconomic History  . Marital status: Married    Spouse name: Not on file  . Number of children: Not on file  . Years of education: Not on file  . Highest education level: Not on file  Occupational History  . Occupation: Energy manager: [employer]  Social Needs  . Financial resource strain: Not on file  . Food insecurity    Worry: Not on file    Inability: Not on file  . Transportation needs    Medical: Not on file    Non-medical: Not on file  Tobacco Use  . Smoking status: Never Smoker  . Smokeless tobacco: Never Used  Substance and Sexual Activity  . Alcohol use: Not Currently    Comment: occassional  . Drug use: No  . Sexual activity: Yes  Lifestyle  . Physical activity    Days per week: Not on file    Minutes per session: Not on file  . Stress: Not on  file  Relationships  . Social Herbalist on phone: Not on file    Gets together: Not on file    Attends religious service: Not on file    Active member of club or organization: Not on file    Attends meetings of clubs or organizations: Not on file    Relationship status: Not on file  . Intimate partner violence    Fear of current or ex partner: Not on file    Emotionally abused: Not on file    Physically abused: Not on file    Forced sexual activity: Not on file  Other Topics Concern  .  Not on file  Social History Narrative  . Not on file     Constitutional: Denies fever, malaise, fatigue, headache or abrupt weight changes.  Respiratory: Denies difficulty breathing, shortness of breath, cough or sputum production.   Cardiovascular: Denies chest pain, chest tightness, palpitations or swelling in the hands or feet.  Neurological: Denies dizziness, difficulty with memory, difficulty with speech or problems with balance and coordination.    No other specific complaints in a complete review of systems (except as listed in HPI above).     Objective:   Physical Exam   BP 126/76   Pulse (!) 57   Temp 98.2 F (36.8 C) (Temporal)   Wt (!) 301 lb (136.5 kg)   SpO2 96%   BMI 44.45 kg/m  Wt Readings from Last 3 Encounters:  11/02/18 (!) 301 lb (136.5 kg)  10/29/18 (!) 302 lb 6.4 oz (137.2 kg)  10/01/18 (!) 310 lb (140.6 kg)    General: Appears his stated age, obese, in NAD. Cardiovascular: Normal rate and rhythm. S1,S2 noted.  No murmur, rubs or gallops noted.Pulmonary/Chest: Normal effort and positive vesicular breath sounds. No respiratory distress. No wheezes, rales or ronchi noted.  Neurological: Alert and oriented.    BMET    Component Value Date/Time   NA 139 06/23/2018 1545   NA 141 10/31/2017 1438   K 4.7 06/23/2018 1545   CL 103 06/23/2018 1545   CO2 31 06/23/2018 1545   GLUCOSE 80 06/23/2018 1545   BUN 18 06/23/2018 1545   BUN 18 10/31/2017 1438    CREATININE 1.27 06/23/2018 1545   CREATININE 1.27 12/06/2015 1600   CALCIUM 9.2 06/23/2018 1545   GFRNONAA 54 (L) 06/09/2018 2115   GFRAA >60 06/09/2018 2115    Lipid Panel     Component Value Date/Time   CHOL 164 06/23/2018 1545   CHOL 170 10/31/2017 1438   TRIG 91.0 06/23/2018 1545   HDL 40.20 06/23/2018 1545   HDL 36 (L) 10/31/2017 1438   CHOLHDL 4 06/23/2018 1545   VLDL 18.2 06/23/2018 1545   LDLCALC 106 (H) 06/23/2018 1545   LDLCALC 108 (H) 10/31/2017 1438    CBC    Component Value Date/Time   WBC 9.5 06/23/2018 1545   RBC 5.23 06/23/2018 1545   HGB 15.1 06/23/2018 1545   HGB 14.0 10/31/2017 1438   HCT 46.0 06/23/2018 1545   HCT 43.5 10/31/2017 1438   PLT 195.0 06/23/2018 1545   PLT 220 10/31/2017 1438   MCV 88.0 06/23/2018 1545   MCV 83 10/31/2017 1438   MCH 27.9 06/09/2018 2115   MCHC 32.8 06/23/2018 1545   RDW 15.4 06/23/2018 1545   RDW 14.4 10/31/2017 1438   LYMPHSABS 2.3 06/09/2018 2115   LYMPHSABS 1.6 10/31/2017 1438   MONOABS 0.8 06/09/2018 2115   EOSABS 0.3 06/09/2018 2115   EOSABS 0.2 10/31/2017 1438   BASOSABS 0.0 06/09/2018 2115   BASOSABS 0.0 10/31/2017 1438    Hgb A1C Lab Results  Component Value Date   HGBA1C 5.7 06/23/2018           Assessment & Plan:   Obesity:  Continue low carb, moderate protein diet Advised continue cutting back on carbs but also making sure he is eating enough calories throughout the day Try to increase exercise to a total of 150 minutes per week  B12 Deficiency:  Start 200 mcg B12 every 2 weeks x 2 months, then monthly x 4 months Will recheck B12 in 6 months  RTC in  1 month for weight check

## 2018-11-02 NOTE — Patient Instructions (Signed)

## 2018-11-03 DIAGNOSIS — M65312 Trigger thumb, left thumb: Secondary | ICD-10-CM | POA: Diagnosis not present

## 2018-11-03 DIAGNOSIS — G4733 Obstructive sleep apnea (adult) (pediatric): Secondary | ICD-10-CM | POA: Diagnosis not present

## 2018-11-10 ENCOUNTER — Telehealth: Payer: Self-pay | Admitting: Cardiovascular Disease

## 2018-11-10 NOTE — Telephone Encounter (Signed)
° °  [facility] Pre-operative Risk Assessment    Request for surgical clearance:  1. What type of surgery is being performed? Left Trigger Finger Release    2. When is this surgery scheduled? TBD   3. What type of clearance is required (medical clearance vs. Pharmacy clearance to hold med vs. Both)? Both  4. Are there any medications that need to be held prior to surgery and how long?please advise    5. Practice name and name of physician performing surgery? [facility]     6. What is your office phone number [phone]    7.   What is your office fax number  [phone]   8.   Anesthesia type (None, local, MAC, general) ? Not noted    Clarisse Gouge 11/10/2018, 2:55 PM  _________________________________________________________________   (provider comments below)

## 2018-11-10 NOTE — Telephone Encounter (Addendum)
   Primary Cardiologist: Kathlyn Sacramento, MD   [name] 58 year old male was contacted today via telephone for preop cardiac evaluation.  He has a prior medical history of hypertension, peroneal DVT PAF, CHF class II, chronic, diastolic, OSA, GERD, OA, and pulmonary embolus.  He denies chest pain, shortness of breath, lower extremity edema, fatigue, weakness, presyncope, syncope, orthopnea, and PND.  Chart reviewed as part of pre-operative protocol coverage. Given past medical history and time since last visit, based on ACC/AHA guidelines, Richard [name] would be at acceptable risk for the planned procedure without further cardiovascular testing.   He has a RCRI class II risk of 0.9% for a major cardiac event. He is able to complete > 4 mets of activity without difficulty.   He has a CHADS2-VASc score of  4 (CHF, HTN, stroke/tia equivalent x 2) -pt had DVT with bilateral PE in 03/2015, CrCl 64.2 (used IBW of 70.7 kg), Platelet count 195  Per office protocol, patient can hold Eliquis for 1 days prior to procedure. Patient should restart Eliquis on the evening of procedure or day after, at discretion of procedure MD  I will route this recommendation to the requesting party via [facility] fax function and remove from pre-op pool.  Please call with questions.  Jossie Ng. Doron Shake NP-C    08/25/2018 11:58 AM    [address] Office [phone] Fax [phone]

## 2018-11-10 NOTE — Telephone Encounter (Signed)
Patient with diagnosis of atrial fibrillation on Eliquis for anticoagulation.    Procedure: left trigger finger release Date of procedure: TBD  CHADS2-VASc score of  4 (CHF, HTN, stroke/tia equivalent x 2) -pt had DVT with bilateral PE in 03/2015  CrCl 64.2 (used IBW of 70.7 kg) Platelet count 195  Per office protocol, patient can hold Eliquis for 1 days prior to procedure.    Patient should restart Eliquis on the evening of procedure or day after, at discretion of procedure MD

## 2018-11-12 ENCOUNTER — Telehealth: Payer: Self-pay | Admitting: Cardiovascular Disease

## 2018-11-12 NOTE — Telephone Encounter (Signed)
   [facility] Pre-operative Risk Assessment    Request for surgical clearance:  1. What type of surgery is being performed? LEFT TRIGGER FINGER RELEASE  2. When is this surgery scheduled? TBD  3. What type of clearance is required (medical clearance vs. Pharmacy clearance to hold med vs. Both)? NOT LISTED  4. Are there any medications that need to be held prior to surgery and how long? NOT LISTED  5. Practice name and name of physician performing surgery? [facility]  6. What is your office phone number [phone]   7.   What is your office fax number [phone]  8.   Anesthesia type (None, local, MAC, general) ? LOCAL   Lucienne Minks 11/12/2018, 12:51 PM  _________________________________________________________________   (provider comments below)

## 2018-11-12 NOTE — Telephone Encounter (Signed)
   Primary Cardiologist: Kathlyn Sacramento, MD  Juanda Bond. Yitzchok 58 year old male was contacted  via telephone for preop cardiac evaluation.  He has a prior medical history of hypertension, peroneal DVT PAF, CHF class II, chronic, diastolic, OSA, GERD, OA, and pulmonary embolus.  He denies chest pain, shortness of breath, lower extremity edema, fatigue, weakness, presyncope, syncope, orthopnea, and PND.  Chart reviewed as part of pre-operative protocol coverage. Given past medical history and time since last visit, based on ACC/AHA guidelines, BARDIA SARANTOS would be at acceptable risk for the planned procedure without further cardiovascular testing.   He has a RCRI class II risk of 0.9% for a major cardiac event. He is able to complete > 4 mets of activity without difficulty.   He has a CHADS2-VASc score of4(CHF, HTN, stroke/tia equivalentx 2) -pt had DVT with bilateral PE in 03/2015, CrCl64.2 (used IBW of 70.7 kg), Platelet count195  Per office protocol, patient can holdEliquisfor 1days prior to procedure. Patient should restartEliquison the evening of procedure or day after, at discretion of procedure MD  I will route this recommendation to the requesting party via [facility] fax function and remove from pre-op pool.  Please call with questions.  Jossie Ng. [facility] [place] [address] Office [phone] Fax [phone]

## 2018-11-17 ENCOUNTER — Ambulatory Visit: Payer: PPO

## 2018-11-17 DIAGNOSIS — M65312 Trigger thumb, left thumb: Secondary | ICD-10-CM | POA: Diagnosis not present

## 2018-11-18 ENCOUNTER — Ambulatory Visit (INDEPENDENT_AMBULATORY_CARE_PROVIDER_SITE_OTHER): Payer: PPO | Admitting: *Deleted

## 2018-11-18 DIAGNOSIS — E538 Deficiency of other specified B group vitamins: Secondary | ICD-10-CM

## 2018-11-18 MED ORDER — CYANOCOBALAMIN 1000 MCG/ML IJ SOLN: 1000.0000 ug | Freq: Once | INTRAMUSCULAR | Status: DC

## 2018-11-18 NOTE — Progress Notes (Signed)
Per orders of Dr. Silvio Pate in [place], NP absence, injection of B12 104mcg given by Nyoka Cowden, Omarrion Carmer M. Patient tolerated injection well.

## 2018-11-26 ENCOUNTER — Other Ambulatory Visit: Payer: Self-pay

## 2018-11-26 ENCOUNTER — Emergency Department: Payer: PPO

## 2018-11-26 ENCOUNTER — Emergency Department
Admission: EM | Admit: 2018-11-26 | Discharge: 2018-11-26 | Disposition: A | Discharge status: Home / Self Care | Payer: PPO | Attending: Emergency Medicine | Admitting: Emergency Medicine

## 2018-11-26 DIAGNOSIS — Y9301 Activity, walking, marching and hiking: Secondary | ICD-10-CM | POA: Diagnosis not present

## 2018-11-26 DIAGNOSIS — M542 Cervicalgia: Secondary | ICD-10-CM | POA: Diagnosis not present

## 2018-11-26 DIAGNOSIS — S00211A Abrasion of right eyelid and periocular area, initial encounter: Secondary | ICD-10-CM | POA: Diagnosis not present

## 2018-11-26 DIAGNOSIS — S199XXA Unspecified injury of neck, initial encounter: Secondary | ICD-10-CM | POA: Diagnosis not present

## 2018-11-26 DIAGNOSIS — M25552 Pain in left hip: Secondary | ICD-10-CM | POA: Diagnosis not present

## 2018-11-26 DIAGNOSIS — W01198A Fall on same level from slipping, tripping and stumbling with subsequent striking against other object, initial encounter: Secondary | ICD-10-CM | POA: Insufficient documentation

## 2018-11-26 DIAGNOSIS — M545 Low back pain: Secondary | ICD-10-CM | POA: Insufficient documentation

## 2018-11-26 DIAGNOSIS — Y999 Unspecified external cause status: Secondary | ICD-10-CM | POA: Insufficient documentation

## 2018-11-26 DIAGNOSIS — W19XXXA Unspecified fall, initial encounter: Secondary | ICD-10-CM

## 2018-11-26 DIAGNOSIS — M25561 Pain in right knee: Secondary | ICD-10-CM | POA: Diagnosis not present

## 2018-11-26 DIAGNOSIS — Z79899 Other long term (current) drug therapy: Secondary | ICD-10-CM | POA: Insufficient documentation

## 2018-11-26 DIAGNOSIS — M25562 Pain in left knee: Secondary | ICD-10-CM | POA: Insufficient documentation

## 2018-11-26 DIAGNOSIS — S01111A Laceration without foreign body of right eyelid and periocular area, initial encounter: Secondary | ICD-10-CM | POA: Diagnosis not present

## 2018-11-26 DIAGNOSIS — S8991XA Unspecified injury of right lower leg, initial encounter: Secondary | ICD-10-CM | POA: Diagnosis not present

## 2018-11-26 DIAGNOSIS — S3992XA Unspecified injury of lower back, initial encounter: Secondary | ICD-10-CM | POA: Diagnosis not present

## 2018-11-26 DIAGNOSIS — M7989 Other specified soft tissue disorders: Secondary | ICD-10-CM | POA: Diagnosis not present

## 2018-11-26 DIAGNOSIS — S0990XA Unspecified injury of head, initial encounter: Secondary | ICD-10-CM | POA: Diagnosis not present

## 2018-11-26 DIAGNOSIS — I5032 Chronic diastolic (congestive) heart failure: Secondary | ICD-10-CM | POA: Insufficient documentation

## 2018-11-26 DIAGNOSIS — I110 Hypertensive heart disease with heart failure: Secondary | ICD-10-CM | POA: Diagnosis not present

## 2018-11-26 DIAGNOSIS — R52 Pain, unspecified: Secondary | ICD-10-CM | POA: Diagnosis not present

## 2018-11-26 DIAGNOSIS — Z7901 Long term (current) use of anticoagulants: Secondary | ICD-10-CM | POA: Diagnosis not present

## 2018-11-26 DIAGNOSIS — Y929 Unspecified place or not applicable: Secondary | ICD-10-CM | POA: Insufficient documentation

## 2018-11-26 DIAGNOSIS — S299XXA Unspecified injury of thorax, initial encounter: Secondary | ICD-10-CM | POA: Diagnosis not present

## 2018-11-26 DIAGNOSIS — S80212A Abrasion, left knee, initial encounter: Secondary | ICD-10-CM | POA: Diagnosis not present

## 2018-11-26 MED ORDER — OXYCODONE-ACETAMINOPHEN 5-325 MG PO TABS
1.0000 | ORAL_TABLET | Freq: Once | ORAL | Status: AC
  Administered 2018-11-26: 1 via ORAL
  Filled 2018-11-26: qty 1

## 2018-11-26 MED ORDER — ONDANSETRON 4 MG PO TBDP
4.0000 mg | ORAL_TABLET | Freq: Once | ORAL | Status: AC
  Administered 2018-11-26: 4 mg via ORAL
  Filled 2018-11-26: qty 1

## 2018-11-26 NOTE — ED Provider Notes (Signed)
[facility] Emergency Department Provider Note  ____________________________________________  Time seen: Approximately 9:46 PM  I have reviewed the triage vital signs and the nursing notes.   HISTORY  Chief Complaint Fall    HPI Richard [name] is a 58 y.o. male presents to the emergency department after a mechanical fall.  Patient was walking his dog and slipped, hitting his face against a fence post.  Patient has a abrasion under his right eye.  He is complaining of neck pain, bilateral knee pain and low back pain.  Patient is currently being anticoagulated with Eliquis given recent history of DVT and PE.  Patient denies chest pain, chest tightness or abdominal pain.  No numbness or tingling in the upper and lower extremities.        Past Medical History:  Diagnosis Date  . Arthritis   . Arthrofibrosis of total knee arthroplasty ([place]) 05/30/2015  . Bilateral pulmonary embolism ([place])    a. 03/2015 CTA: acute bilat PE; b. IVC filter placed in 2017--> removed 2018.  Marland Kitchen Chest pain    a. 2015 Abnl MV;  b. 2015 Cath: nl cors.  . Chronic diastolic CHF (congestive heart failure) ([place])    a. 03/2015 Echo: EF 55-65%, poor windows, mildly dil RV.  Marland Kitchen DVT (deep venous thrombosis) ([place])    a. 03/2015 U/S: occlusive DVT w/in the distal aspect of the Left fem vein through the L popliteal vein.  Marland Kitchen GERD (gastroesophageal reflux disease)   . Headache   . Hypertensive heart disease   . Obesity   . OSA (obstructive sleep apnea)    uses CPAP nightly  . PAF (paroxysmal atrial fibrillation) ([place])   . Primary localized osteoarthritis of left knee    a. 11/2014 s/p L TKA.  . Primary localized osteoarthritis of right knee    a. 02/2014 s/p R Partial Knee arthroplasty.  . Stiffness of left knee    a. 12/2014 s/p manipulation under anesthesia.  . Urine incontinence     Patient Active Problem List   Diagnosis Date Noted  . OA (osteoarthritis) 06/23/2018  . History of  pulmonary embolus (PE) 06/23/2018  . CHF (congestive heart failure), NYHA class II, chronic, diastolic ([place]) [id number]  . PAF (paroxysmal atrial fibrillation) ([place]) 03/30/2015  . Peroneal DVT (deep venous thrombosis) ([place])   . Hypertension 08/17/2011  . GERD (gastroesophageal reflux disease) 08/17/2011  . OSA (obstructive sleep apnea) 08/16/2011    Past Surgical History:  Procedure Laterality Date  . APPENDECTOMY    . CARDIAC CATHETERIZATION    . CARPAL TUNNEL RELEASE Left 03/2018  . EXAM UNDER ANESTHESIA WITH MANIPULATION OF KNEE Left 05/30/2015   Procedure: EXAM UNDER ANESTHESIA WITH MANIPULATION OF LEFT KNEE;  Surgeon: Marchia Bond, MD;  Location: [place];  Service: Orthopedics;  Laterality: Left;  . I&D KNEE WITH POLY EXCHANGE Left 10/30/2015   Procedure: IRRIGATION AND DEBRIDEMENT KNEE WITH POLY EXCHANGE PLACE SPACERS;  Surgeon: Frederik Pear, MD;  Location: [place];  Service: Orthopedics;  Laterality: Left;  OFF ELIQUIST 3 DAYS  . IVC FILTER PLACEMENT ([facility] HX)  04/13/15  . IVC FILTER REMOVAL N/A 08/13/2016   Procedure: IVC Filter Removal;  Surgeon: Katha Cabal, MD;  Location: [place] [facility];  Service: Cardiovascular;  Laterality: N/A;  . JOINT REPLACEMENT    . KNEE ARTHROSCOPY Left 08/08/2015   Procedure: LEFT KNEE MANIPULATION WITH ARTHROSCOPIC LYSIS OF ADHESIONS AND ASPIRATION;  Surgeon: Marchia Bond, MD;  Location: [place];  Service: Orthopedics;  Laterality: Left;  . KNEE CLOSED REDUCTION Left 01/20/2015   Procedure: LEFT KNEE MANIPULATION;  Surgeon: Marchia Bond, MD;  Location: [place];  Service: Orthopedics;  Laterality: Left;  . LEFT HEART CATHETERIZATION WITH CORONARY ANGIOGRAM N/A 01/12/2014   Procedure: LEFT HEART CATHETERIZATION WITH CORONARY ANGIOGRAM;  Surgeon: Jettie Booze, MD;  Location: [facility] CATH LAB;  Service: Cardiovascular;  Laterality: N/A;  . ORTHOPEDIC SURGERY Left    arthroscopy x3  . PARTIAL KNEE ARTHROPLASTY Right 02/25/2014    Procedure: RIGHT KNEE ARTHROPLASTY CONDYLE AND PLATEAU MEDIAL COMPARTMENT ;  Surgeon: Johnny Bridge, MD;  Location: [place];  Service: Orthopedics;  Laterality: Right;  . PERIPHERAL VASCULAR CATHETERIZATION N/A 03/29/2015   Procedure: IVC Filter Insertion, and possible thrombectomy;  Surgeon: Katha Cabal, MD;  Location: [place] [facility];  Service: Cardiovascular;  Laterality: N/A;  . ROTATOR CUFF REPAIR Left   . TOTAL KNEE ARTHROPLASTY  12/06/2014   Procedure: TOTAL KNEE ARTHROPLASTY;  Surgeon: Marchia Bond, MD;  Location: [place];  Service: Orthopedics;;  . TOTAL KNEE REVISION Left 02/08/2016   Procedure: TOTAL KNEE REVISION;  Surgeon: Frederik Pear, MD;  Location: [place];  Service: Orthopedics;  Laterality: Left;    Prior to Admission medications   Medication Sig Start Date End Date Taking? Authorizing Provider  acetaminophen (TYLENOL) 500 MG tablet Take 1,000 mg by mouth every 6 (six) hours as needed for moderate pain.    [provider]  apixaban (ELIQUIS) 5 MG TABS tablet TAKE 1 TABLET (5 MG TOTAL) BY MOUTH 2 (TWO) TIMES DAILY. 02/02/18   Wellington Hampshire, MD  baclofen (LIORESAL) 10 MG tablet TAKE 1-2 TABLETS (10-20 MG TOTAL) BY MOUTH 3 (THREE) TIMES DAILY AS NEEDED FOR MUSCLE SPASMS. 05/06/16   Jearld Fenton, NP  flecainide (TAMBOCOR) 100 MG tablet Take 1 tablet (100 mg total) by mouth 2 (two) times daily. 03/11/18   Wellington Hampshire, MD  furosemide (LASIX) 40 MG tablet TAKE 1 TABLET (40 MG TOTAL) BY MOUTH DAILY AS NEEDED. MUST SCHEDULE ANNUAL EXAM 02/10/18   Jearld Fenton, NP  gabapentin (NEURONTIN) 100 MG capsule Take 1 capsule (100 mg total) by mouth daily as needed. Patient taking differently: Take 100 mg by mouth daily as needed (for hand pain).  12/20/16   Jearld Fenton, NP  HYDROcodone-acetaminophen (NORCO/VICODIN) 5-325 MG tablet Take 1 tablet by mouth every 6 (six) hours as needed for moderate pain. 06/09/18   Nena Polio, MD  metoprolol  succinate (TOPROL-XL) 25 MG 24 hr tablet TAKE 1 TABLET BY MOUTH DAILY WITH OR IMMEDIATELY FOLLOWING A MEAL 09/10/18   Wellington Hampshire, MD  Sennosides-Docusate Sodium (SM STOOL SOFTENER/LAXATIVE PO) Take 1 tablet by mouth at bedtime.    [provider]  traMADol (ULTRAM) 50 MG tablet TAKE 2 TABLETS BY MOUTH EVERY NIGHT AT BEDTIME AS NEEDED 04/14/18   Jearld Fenton, NP    Allergies Penicillin g, Penicillins, and Vancomycin  Family History  Problem Relation Age of Onset  . Coronary artery disease Mother   . Hyperlipidemia Mother   . Hypertension Mother   . Arthritis Mother   . Coronary artery disease Father   . Heart disease Paternal Grandmother   . Alzheimer's disease Paternal Grandfather     Social History Social History   Tobacco Use  . Smoking status: Never Smoker  . Smokeless tobacco: Never Used  Substance Use Topics  . Alcohol use: Not Currently    Comment:  occassional  . Drug use: No     Review of Systems  Constitutional: No fever/chills Eyes: No visual changes. No discharge ENT: No upper respiratory complaints. Cardiovascular: no chest pain. Respiratory: no cough. No SOB. Gastrointestinal: No abdominal pain.  No nausea, no vomiting.  No diarrhea.  No constipation. Genitourinary: Negative for dysuria. No hematuria Musculoskeletal: Patient has bilateral knee pain and low back pain.  Skin: Negative for rash, abrasions, lacerations, ecchymosis. Neurological: Patient has headache.   ____________________________________________   PHYSICAL EXAM:  VITAL SIGNS: ED Triage Vitals  Enc Vitals Group     BP 11/26/18 2112 (!) 130/43     Pulse Rate 11/26/18 2112 (!) 53     Resp 11/26/18 2112 16     Temp 11/26/18 2112 97.6 F (36.4 C)     Temp Source 11/26/18 2112 Oral     SpO2 11/26/18 2112 98 %     Weight 11/26/18 2113 300 lb (136.1 kg)     Height 11/26/18 2113 5\' 9"  (1.753 m)     Head Circumference --      Peak Flow --      Pain Score --      Pain  Loc --      Pain Edu? --      Excl. in [place]? --      Constitutional: Alert and oriented. Well appearing and in no acute distress. Eyes: Conjunctivae are normal. PERRL. EOMI. Head: Atraumatic.  Patient has abrasion under right eye. ENT:      Nose: No congestion/rhinnorhea.      Mouth/Throat: Mucous membranes are moist.  Neck: No stridor.  Patient in c-collar at time of initial exam. Cardiovascular: Normal rate, regular rhythm. Normal S1 and S2.  Good peripheral circulation. Respiratory: Normal respiratory effort without tachypnea or retractions. Lungs CTAB. Good air entry to the bases with no decreased or absent breath sounds. Gastrointestinal: Bowel sounds 4 quadrants. Soft and nontender to palpation. No guarding or rigidity. No palpable masses. No distention. No CVA tenderness. Musculoskeletal: Full range of motion to all extremities. No gross deformities appreciated. Neurologic:  Normal speech and language. No gross focal neurologic deficits are appreciated.  Skin:  Skin is warm, dry and intact. No rash noted. Psychiatric: Mood and affect are normal. Speech and behavior are normal. Patient exhibits appropriate insight and judgement.   ____________________________________________   LABS (all labs ordered are listed, but only abnormal results are displayed)  Labs Reviewed - No data to display ____________________________________________  EKG   ____________________________________________  RADIOLOGY I personally viewed and evaluated these images as part of my medical decision making, as well as reviewing the written report by the radiologist.    Dg Chest 2 View  Result Date: 11/26/2018 CLINICAL DATA:  Fall EXAM: CHEST - 2 VIEW COMPARISON:  Radiograph 09/22/2018 FINDINGS: Accounting for low lung volumes and body habitus, the lungs are clear. Lateral radiograph is underpenetrated secondary to habitus as well. No pneumothorax or effusion. Prominence of the cardiac silhouette  likely related to portable technique. No abnormal cardiomediastinal contours. No acute osseous or soft tissue injury is seen. Multilevel degenerative changes are present in the imaged portions of the spine. IMPRESSION: Atelectasis, otherwise no acute cardiopulmonary abnormality. Suboptimal lateral radiograph due to body habitus. Electronically Signed   By: Lovena Le M.D.   On: 11/26/2018 22:57   Dg Lumbar Spine 2-3 Views  Result Date: 11/26/2018 CLINICAL DATA:  Fall EXAM: LUMBAR SPINE - 2-3 VIEW COMPARISON:  Radiograph September 12, 2016 FINDINGS: Five lumbar  type vertebral bodies are noted. No acute fracture or vertebral body height loss. No suspicious osseous lesion. No spondylolisthesis is identified. Lateral radiography is somewhat limited due to body habitus. Included portions of the pelvis are congruent and intact. Mild multilevel discogenic and facet degenerative changes are most pronounced L4-S1. IMPRESSION: 1. No acute osseous abnormality or suspicious osseous lesion. 2. No spondylolisthesis. Electronically Signed   By: Lovena Le M.D.   On: 11/26/2018 23:04   Dg Knee 2 Views Left  Result Date: 11/26/2018 CLINICAL DATA:  Fall, knee abrasions EXAM: LEFT KNEE - 1-2 VIEW COMPARISON:  Radiograph 02/08/2016 FINDINGS: Patient is post total right knee arthroplasty with posterior patellar resurfacing. Hardware appears intact and aligned. No periprosthetic fracture or lucency. Few corticated intra-articular loose bodies are noted. IMPRESSION: 1. No evidence of acute fracture or hardware complication from total right knee arthroplasty. 2. Scattered intra-articular loose bodies. Electronically Signed   By: Lovena Le M.D.   On: 11/26/2018 23:01   Dg Knee 2 Views Right  Result Date: 11/26/2018 CLINICAL DATA:  Fall EXAM: RIGHT KNEE - 1-2 VIEW COMPARISON:  Radiograph 02/26/2014 FINDINGS: Patient is post medial femorotibial hemiarthroplasty. Hardware appears intact and well aligned. No periprosthetic  fracture or lucency is seen. Additional moderate degenerative changes noted at the patellofemoral compartment mild changes in the lateral compartment. No acute fracture or traumatic malalignment is seen. Mild soft tissue swelling anteriorly. No sizeable joint effusion. Few intra-articular bodies are likely postsurgical in nature. IMPRESSION: 1. Medial femorotibial hemiarthroplasty without evidence of acute hardware complication. 2. Moderate patellofemoral and mild lateral compartment degenerative changes. 3. No acute osseous abnormality. Mild soft tissue swelling anteriorly. Electronically Signed   By: Lovena Le M.D.   On: 11/26/2018 23:02   Ct Head Wo Contrast  Result Date: 11/26/2018 CLINICAL DATA:  Facial trauma, fracture suspected, right infraorbital laceration EXAM: CT HEAD WITHOUT CONTRAST CT MAXILLOFACIAL WITHOUT CONTRAST CT CERVICAL SPINE WITHOUT CONTRAST TECHNIQUE: Multidetector CT imaging of the head, cervical spine, and maxillofacial structures were performed using the standard protocol without intravenous contrast. Multiplanar CT image reconstructions of the cervical spine and maxillofacial structures were also generated. COMPARISON:  CT head and cervical spine 02/25/2017 FINDINGS: CT HEAD FINDINGS Brain: Benign-appearing dural calcifications. No evidence of acute infarction, hemorrhage, hydrocephalus, extra-axial collection or mass lesion/mass effect. Vascular: No hyperdense vessel or unexpected calcification. Skull: No calvarial fracture or suspicious osseous lesion. No scalp swelling or hematoma. Other: None CT MAXILLOFACIAL FINDINGS Osseous: No fracture of the bony orbits. Nasal bones are intact. No mid face fractures are seen. The pterygoid plates are intact. The mandible is intact. Temporomandibular joints are normally aligned. No temporal bone fractures are identified. No fractured or avulsed teeth. Carious lesion of right first mandibular molar. Impacted bilateral third maxillary molars.  Orbits: The globes appear normal and symmetric. Symmetric appearance of the extraocular musculature and optic nerve sheath complexes. Normal caliber of the superior ophthalmic veins. Sinuses: Minimal thickening in the anterior ethmoids. No air-fluid levels. Mastoid air cells and middle ear cavities are clear. Ossicular chains are normally configured. Soft tissues: Minimal skin thickening and laceration the right infraorbital/malar soft tissues. No retained foreign body or subcutaneous gas is seen. No other significant swelling or soft tissue injury is seen. Bilateral parotid gland enlargement is nonspecific. CT CERVICAL SPINE FINDINGS Alignment: Cervical stabilization collar is in place. Straightening of the normal cervical lordosis. No traumatic listhesis. Craniocervical and atlantoaxial alignment is maintained. No abnormal facet widening Skull base and vertebrae: Evaluation of the lower cervical  levels is complicated by photon starvation artifact due to patient body habitus. No acute fracture. No primary bone lesion or focal pathologic process. Degenerative arthrosis and a small os odontoideum are noted at the atlantoaxial interval. No suspicious osseous lesions. No definite fractures. Soft tissues and spinal canal: No pre or paravertebral fluid or swelling. No visible canal hematoma. Disc levels: Multilevel intervertebral disc height loss with spondylitic endplate changes, maximal C4-5. Mild multilevel foraminal narrowing is secondary to uncinate spurring and facet hypertrophic changes as well. No severe canal or neural foraminal narrowing. Upper chest: No acute abnormality in the upper chest or imaged lung apices. Other: Normal thyroid IMPRESSION: 1. No acute intracranial abnormality. 2. No acute facial fracture. 3. Minimal skin thickening and laceration of the right infraorbital/malar soft tissues. No retained foreign body or subcutaneous gas. 4. No acute cervical spine fracture or traumatic listhesis. 5. Mild  multilevel cervical spondylitic changes, as detailed above. Electronically Signed   By: Lovena Le M.D.   On: 11/26/2018 22:48   Ct Cervical Spine Wo Contrast  Result Date: 11/26/2018 CLINICAL DATA:  Facial trauma, fracture suspected, right infraorbital laceration EXAM: CT HEAD WITHOUT CONTRAST CT MAXILLOFACIAL WITHOUT CONTRAST CT CERVICAL SPINE WITHOUT CONTRAST TECHNIQUE: Multidetector CT imaging of the head, cervical spine, and maxillofacial structures were performed using the standard protocol without intravenous contrast. Multiplanar CT image reconstructions of the cervical spine and maxillofacial structures were also generated. COMPARISON:  CT head and cervical spine 02/25/2017 FINDINGS: CT HEAD FINDINGS Brain: Benign-appearing dural calcifications. No evidence of acute infarction, hemorrhage, hydrocephalus, extra-axial collection or mass lesion/mass effect. Vascular: No hyperdense vessel or unexpected calcification. Skull: No calvarial fracture or suspicious osseous lesion. No scalp swelling or hematoma. Other: None CT MAXILLOFACIAL FINDINGS Osseous: No fracture of the bony orbits. Nasal bones are intact. No mid face fractures are seen. The pterygoid plates are intact. The mandible is intact. Temporomandibular joints are normally aligned. No temporal bone fractures are identified. No fractured or avulsed teeth. Carious lesion of right first mandibular molar. Impacted bilateral third maxillary molars. Orbits: The globes appear normal and symmetric. Symmetric appearance of the extraocular musculature and optic nerve sheath complexes. Normal caliber of the superior ophthalmic veins. Sinuses: Minimal thickening in the anterior ethmoids. No air-fluid levels. Mastoid air cells and middle ear cavities are clear. Ossicular chains are normally configured. Soft tissues: Minimal skin thickening and laceration the right infraorbital/malar soft tissues. No retained foreign body or subcutaneous gas is seen. No other  significant swelling or soft tissue injury is seen. Bilateral parotid gland enlargement is nonspecific. CT CERVICAL SPINE FINDINGS Alignment: Cervical stabilization collar is in place. Straightening of the normal cervical lordosis. No traumatic listhesis. Craniocervical and atlantoaxial alignment is maintained. No abnormal facet widening Skull base and vertebrae: Evaluation of the lower cervical levels is complicated by photon starvation artifact due to patient body habitus. No acute fracture. No primary bone lesion or focal pathologic process. Degenerative arthrosis and a small os odontoideum are noted at the atlantoaxial interval. No suspicious osseous lesions. No definite fractures. Soft tissues and spinal canal: No pre or paravertebral fluid or swelling. No visible canal hematoma. Disc levels: Multilevel intervertebral disc height loss with spondylitic endplate changes, maximal C4-5. Mild multilevel foraminal narrowing is secondary to uncinate spurring and facet hypertrophic changes as well. No severe canal or neural foraminal narrowing. Upper chest: No acute abnormality in the upper chest or imaged lung apices. Other: Normal thyroid IMPRESSION: 1. No acute intracranial abnormality. 2. No acute facial fracture. 3. Minimal  skin thickening and laceration of the right infraorbital/malar soft tissues. No retained foreign body or subcutaneous gas. 4. No acute cervical spine fracture or traumatic listhesis. 5. Mild multilevel cervical spondylitic changes, as detailed above. Electronically Signed   By: Lovena Le M.D.   On: 11/26/2018 22:48   Ct Maxillofacial Wo Contrast  Result Date: 11/26/2018 CLINICAL DATA:  Facial trauma, fracture suspected, right infraorbital laceration EXAM: CT HEAD WITHOUT CONTRAST CT MAXILLOFACIAL WITHOUT CONTRAST CT CERVICAL SPINE WITHOUT CONTRAST TECHNIQUE: Multidetector CT imaging of the head, cervical spine, and maxillofacial structures were performed using the standard protocol  without intravenous contrast. Multiplanar CT image reconstructions of the cervical spine and maxillofacial structures were also generated. COMPARISON:  CT head and cervical spine 02/25/2017 FINDINGS: CT HEAD FINDINGS Brain: Benign-appearing dural calcifications. No evidence of acute infarction, hemorrhage, hydrocephalus, extra-axial collection or mass lesion/mass effect. Vascular: No hyperdense vessel or unexpected calcification. Skull: No calvarial fracture or suspicious osseous lesion. No scalp swelling or hematoma. Other: None CT MAXILLOFACIAL FINDINGS Osseous: No fracture of the bony orbits. Nasal bones are intact. No mid face fractures are seen. The pterygoid plates are intact. The mandible is intact. Temporomandibular joints are normally aligned. No temporal bone fractures are identified. No fractured or avulsed teeth. Carious lesion of right first mandibular molar. Impacted bilateral third maxillary molars. Orbits: The globes appear normal and symmetric. Symmetric appearance of the extraocular musculature and optic nerve sheath complexes. Normal caliber of the superior ophthalmic veins. Sinuses: Minimal thickening in the anterior ethmoids. No air-fluid levels. Mastoid air cells and middle ear cavities are clear. Ossicular chains are normally configured. Soft tissues: Minimal skin thickening and laceration the right infraorbital/malar soft tissues. No retained foreign body or subcutaneous gas is seen. No other significant swelling or soft tissue injury is seen. Bilateral parotid gland enlargement is nonspecific. CT CERVICAL SPINE FINDINGS Alignment: Cervical stabilization collar is in place. Straightening of the normal cervical lordosis. No traumatic listhesis. Craniocervical and atlantoaxial alignment is maintained. No abnormal facet widening Skull base and vertebrae: Evaluation of the lower cervical levels is complicated by photon starvation artifact due to patient body habitus. No acute fracture. No primary  bone lesion or focal pathologic process. Degenerative arthrosis and a small os odontoideum are noted at the atlantoaxial interval. No suspicious osseous lesions. No definite fractures. Soft tissues and spinal canal: No pre or paravertebral fluid or swelling. No visible canal hematoma. Disc levels: Multilevel intervertebral disc height loss with spondylitic endplate changes, maximal C4-5. Mild multilevel foraminal narrowing is secondary to uncinate spurring and facet hypertrophic changes as well. No severe canal or neural foraminal narrowing. Upper chest: No acute abnormality in the upper chest or imaged lung apices. Other: Normal thyroid IMPRESSION: 1. No acute intracranial abnormality. 2. No acute facial fracture. 3. Minimal skin thickening and laceration of the right infraorbital/malar soft tissues. No retained foreign body or subcutaneous gas. 4. No acute cervical spine fracture or traumatic listhesis. 5. Mild multilevel cervical spondylitic changes, as detailed above. Electronically Signed   By: Lovena Le M.D.   On: 11/26/2018 22:48    ____________________________________________    PROCEDURES  Procedure(s) performed:    Procedures    Medications  oxyCODONE-acetaminophen (PERCOCET/ROXICET) 5-325 MG per tablet 1 tablet (has no administration in time range)  ondansetron (ZOFRAN-ODT) disintegrating tablet 4 mg (has no administration in time range)     ____________________________________________   INITIAL IMPRESSION / ASSESSMENT AND PLAN / ED COURSE  Pertinent labs & imaging results that were available during my care  of the patient were reviewed by me and considered in my medical decision making (see chart for details).  Review of the [place] CSRS was performed in accordance of the [place] prior to dispensing any controlled drugs.           Assessment and Plan:  Fall:  58 year old male presents to the emergency department after a mechanical, nonsyncopal fall while walking his dog.   Patient had facial pain, headache, neck pain, low back pain and bilateral knee pain.  Differential diagnosis included intracranial bleed, skull fracture, C-spine fracture, pneumothorax...  Patient was mildly hypertensive at triage and was bradycardic.  CTs of the head, face and neck revealed no acute abnormality.  No acute fractures were identified on x-ray examination of the bilateral knees and lumbar spine.  No evidence of pneumothorax on chest x-ray.  Patient was given a Percocet in the emergency department.  Patient reports that he recently had a surgery on his left hand and has Norco at home for pain which he has not used.  He was advised to follow-up with primary care as needed.  All patient questions were answered.   ____________________________________________  FINAL CLINICAL IMPRESSION(S) / ED DIAGNOSES  Final diagnoses:  Fall, initial encounter      NEW MEDICATIONS STARTED DURING THIS VISIT:  ED Discharge Orders    None          This chart was dictated using voice recognition software/Dragon. Despite best efforts to proofread, errors can occur which can change the meaning. Any change was purely unintentional.    Lannie Fields, PA-C 11/26/18 2329    Duffy Bruce, MD 11/27/18 [phone]

## 2018-11-26 NOTE — ED Triage Notes (Signed)
Pt fell while going inside while walking dog. Hit face on fence post. Laceration under right eye. Cuts on both knees. Pt states both knees, lower back, left hip and neck all hurt. Denies LOC.

## 2018-11-27 DIAGNOSIS — Z9889 Other specified postprocedural states: Secondary | ICD-10-CM | POA: Diagnosis not present

## 2018-12-01 ENCOUNTER — Ambulatory Visit: Payer: PPO

## 2018-12-01 ENCOUNTER — Ambulatory Visit (INDEPENDENT_AMBULATORY_CARE_PROVIDER_SITE_OTHER): Payer: PPO | Admitting: Internal Medicine

## 2018-12-01 ENCOUNTER — Encounter: Payer: Self-pay | Admitting: Internal Medicine

## 2018-12-01 ENCOUNTER — Other Ambulatory Visit: Payer: Self-pay

## 2018-12-01 VITALS — BP 130/90 | HR 59 | Temp 98.3°F | Wt 305.0 lb

## 2018-12-01 DIAGNOSIS — M5387 Other specified dorsopathies, lumbosacral region: Secondary | ICD-10-CM | POA: Diagnosis not present

## 2018-12-01 DIAGNOSIS — S335XXA Sprain of ligaments of lumbar spine, initial encounter: Secondary | ICD-10-CM | POA: Diagnosis not present

## 2018-12-01 DIAGNOSIS — S161XXA Strain of muscle, fascia and tendon at neck level, initial encounter: Secondary | ICD-10-CM | POA: Diagnosis not present

## 2018-12-01 DIAGNOSIS — R635 Abnormal weight gain: Secondary | ICD-10-CM

## 2018-12-01 DIAGNOSIS — E538 Deficiency of other specified B group vitamins: Secondary | ICD-10-CM

## 2018-12-01 DIAGNOSIS — S39012A Strain of muscle, fascia and tendon of lower back, initial encounter: Secondary | ICD-10-CM | POA: Diagnosis not present

## 2018-12-01 DIAGNOSIS — M531 Cervicobrachial syndrome: Secondary | ICD-10-CM | POA: Diagnosis not present

## 2018-12-01 DIAGNOSIS — M25561 Pain in right knee: Secondary | ICD-10-CM | POA: Diagnosis not present

## 2018-12-01 MED ORDER — CYANOCOBALAMIN 1000 MCG/ML IJ SOLN: 1000.0000 ug | Freq: Once | INTRAMUSCULAR | Status: DC

## 2018-12-01 NOTE — Patient Instructions (Signed)
Carbohydrate Counting for Diabetes Mellitus, Adult  Carbohydrate counting is a method of keeping track of how many carbohydrates you eat. Eating carbohydrates naturally increases the amount of sugar (glucose) in the blood. Counting how many carbohydrates you eat helps keep your blood glucose within normal limits, which helps you manage your diabetes (diabetes mellitus). It is important to know how many carbohydrates you can safely have in each meal. This is different for every person. A diet and nutrition specialist (registered dietitian) can help you make a meal plan and calculate how many carbohydrates you should have at each meal and snack. Carbohydrates are found in the following foods:  Grains, such as breads and cereals.  Dried beans and soy products.  Starchy vegetables, such as potatoes, peas, and corn.  Fruit and fruit juices.  Milk and yogurt.  Sweets and snack foods, such as cake, cookies, candy, chips, and soft drinks. How do I count carbohydrates? There are two ways to count carbohydrates in food. You can use either of the methods or a combination of both. Reading "Nutrition Facts" on packaged food The "Nutrition Facts" list is included on the labels of almost all packaged foods and beverages in the [place] It includes:  The serving size.  Information about nutrients in each serving, including the grams (g) of carbohydrate per serving. To use the "Nutrition Facts":  Decide how many servings you will have.  Multiply the number of servings by the number of carbohydrates per serving.  The resulting number is the total amount of carbohydrates that you will be having. Learning standard serving sizes of other foods When you eat carbohydrate foods that are not packaged or do not include "Nutrition Facts" on the label, you need to measure the servings in order to count the amount of carbohydrates:  Measure the foods that you will eat with a food scale or measuring cup, if needed.   Decide how many standard-size servings you will eat.  Multiply the number of servings by 15. Most carbohydrate-rich foods have about 15 g of carbohydrates per serving. ? For example, if you eat 8 oz (170 g) of strawberries, you will have eaten 2 servings and 30 g of carbohydrates (2 servings x 15 g = 30 g).  For foods that have more than one food mixed, such as soups and casseroles, you must count the carbohydrates in each food that is included. The following list contains standard serving sizes of common carbohydrate-rich foods. Each of these servings has about 15 g of carbohydrates:   hamburger bun or  English muffin.   oz (15 mL) syrup.   oz (14 g) jelly.  1 slice of bread.  1 six-inch tortilla.  3 oz (85 g) cooked rice or pasta.  4 oz (113 g) cooked dried beans.  4 oz (113 g) starchy vegetable, such as peas, corn, or potatoes.  4 oz (113 g) hot cereal.  4 oz (113 g) mashed potatoes or  of a large baked potato.  4 oz (113 g) canned or frozen fruit.  4 oz (120 mL) fruit juice.  4-6 crackers.  6 chicken nuggets.  6 oz (170 g) unsweetened dry cereal.  6 oz (170 g) plain fat-free yogurt or yogurt sweetened with artificial sweeteners.  8 oz (240 mL) milk.  8 oz (170 g) fresh fruit or one small piece of fruit.  24 oz (680 g) popped popcorn. Example of carbohydrate counting Sample meal  3 oz (85 g) chicken breast.  6 oz (170 g)   brown rice.  4 oz (113 g) corn.  8 oz (240 mL) milk.  8 oz (170 g) strawberries with sugar-free whipped topping. Carbohydrate calculation 1. Identify the foods that contain carbohydrates: ? Rice. ? Corn. ? Milk. ? Strawberries. 2. Calculate how many servings you have of each food: ? 2 servings rice. ? 1 serving corn. ? 1 serving milk. ? 1 serving strawberries. 3. Multiply each number of servings by 15 g: ? 2 servings rice x 15 g = 30 g. ? 1 serving corn x 15 g = 15 g. ? 1 serving milk x 15 g = 15 g. ? 1 serving  strawberries x 15 g = 15 g. 4. Add together all of the amounts to find the total grams of carbohydrates eaten: ? 30 g + 15 g + 15 g + 15 g = 75 g of carbohydrates total. Summary  Carbohydrate counting is a method of keeping track of how many carbohydrates you eat.  Eating carbohydrates naturally increases the amount of sugar (glucose) in the blood.  Counting how many carbohydrates you eat helps keep your blood glucose within normal limits, which helps you manage your diabetes.  A diet and nutrition specialist (registered dietitian) can help you make a meal plan and calculate how many carbohydrates you should have at each meal and snack. This information is not intended to replace advice given to you by your health care provider. Make sure you discuss any questions you have with your health care provider. Document Released: 01/07/2005 Document Revised: 08/01/2016 Document Reviewed: 06/21/2015 Elsevier Patient Education  2020 Elsevier Inc.  

## 2018-12-01 NOTE — Progress Notes (Signed)
Subjective:    Patient ID: Richard [name], male    DOB: May 10, 1960, 58 y.o.   MRN: [id number]  HPI  Pt presents to the clinic today for 1 month follow up for weight check. He is interested in getting a gastric sleeve by [facility] surgery. His starting weight was 310 lbs with a BMI of 45.78. His weight today is 305 lbs (up 5 lbs from last month) with a BMI 44.02. He has already met with the nutritionist. He reports he has cut out soft drinks, cutting back on bread and carbs, trying to do more portion control with his meals. He reports he has also been walking more.  Breakfast: one bowl of cereal and scrambled egg Lunch: grilled chicken salad Dinner: vegetables, grilled chicken or [place] Snacks: fruits, crackers with peanut butter Exercise: 1.5-2 miles per day, 3-4 days per week  He restarted B12 injections. His last level was 262, 12/2017  Review of Systems      Past Medical History:  Diagnosis Date  . Arthritis   . Arthrofibrosis of total knee arthroplasty ([place]) 05/30/2015  . Bilateral pulmonary embolism ([place])    a. 03/2015 CTA: acute bilat PE; b. IVC filter placed in 2017--> removed 2018.  Marland Kitchen Chest pain    a. 2015 Abnl MV;  b. 2015 Cath: nl cors.  . Chronic diastolic CHF (congestive heart failure) ([place])    a. 03/2015 Echo: EF 55-65%, poor windows, mildly dil RV.  Marland Kitchen DVT (deep venous thrombosis) ([place])    a. 03/2015 U/S: occlusive DVT w/in the distal aspect of the Left fem vein through the L popliteal vein.  Marland Kitchen GERD (gastroesophageal reflux disease)   . Headache   . Hypertensive heart disease   . Obesity   . OSA (obstructive sleep apnea)    uses CPAP nightly  . PAF (paroxysmal atrial fibrillation) ([place])   . Primary localized osteoarthritis of left knee    a. 11/2014 s/p L TKA.  . Primary localized osteoarthritis of right knee    a. 02/2014 s/p R Partial Knee arthroplasty.  . Stiffness of left knee    a. 12/2014 s/p manipulation under anesthesia.  . Urine incontinence      Current Outpatient Medications  Medication Sig Dispense Refill  . acetaminophen (TYLENOL) 500 MG tablet Take 1,000 mg by mouth every 6 (six) hours as needed for moderate pain.    Marland Kitchen apixaban (ELIQUIS) 5 MG TABS tablet TAKE 1 TABLET (5 MG TOTAL) BY MOUTH 2 (TWO) TIMES DAILY. 60 tablet 6  . baclofen (LIORESAL) 10 MG tablet TAKE 1-2 TABLETS (10-20 MG TOTAL) BY MOUTH 3 (THREE) TIMES DAILY AS NEEDED FOR MUSCLE SPASMS. 180 tablet 0  . flecainide (TAMBOCOR) 100 MG tablet Take 1 tablet (100 mg total) by mouth 2 (two) times daily. 180 tablet 2  . furosemide (LASIX) 40 MG tablet TAKE 1 TABLET (40 MG TOTAL) BY MOUTH DAILY AS NEEDED. MUST SCHEDULE ANNUAL EXAM 30 tablet 0  . gabapentin (NEURONTIN) 100 MG capsule Take 1 capsule (100 mg total) by mouth daily as needed. (Patient taking differently: Take 100 mg by mouth daily as needed (for hand pain). ) 30 capsule 2  . HYDROcodone-acetaminophen (NORCO/VICODIN) 5-325 MG tablet Take 1 tablet by mouth every 6 (six) hours as needed for moderate pain. 15 tablet 0  . metoprolol succinate (TOPROL-XL) 25 MG 24 hr tablet TAKE 1 TABLET BY MOUTH DAILY WITH OR IMMEDIATELY FOLLOWING A MEAL 90 tablet 3  . Sennosides-Docusate Sodium (SM STOOL SOFTENER/LAXATIVE PO)  Take 1 tablet by mouth at bedtime.    . traMADol (ULTRAM) 50 MG tablet TAKE 2 TABLETS BY MOUTH EVERY NIGHT AT BEDTIME AS NEEDED 60 tablet 0   Current Facility-Administered Medications  Medication Dose Route Frequency Provider Last Rate Last Dose  . cyanocobalamin ((VITAMIN B-12)) injection 1,000 mcg  1,000 mcg Intramuscular Q14 Days Jearld Fenton, NP   1,000 mcg at 04/08/18 0858  . cyanocobalamin ((VITAMIN B-12)) injection 1,000 mcg  1,000 mcg Intramuscular Q14 Days Jearld Fenton, NP   1,000 mcg at 11/18/18 1139    Allergies  Allergen Reactions  . Penicillin G Other (See Comments)    From childhood; uncertain of reaction Has patient had a PCN reaction causing immediate rash, facial/tongue/throat swelling,  SOB or lightheadedness with hypotension: Unk Has patient had a PCN reaction causing severe rash involving mucus membranes or skin necrosis: Unk Has patient had a PCN reaction that required hospitalization: Unk Has patient had a PCN reaction occurring within the last 10 years: No If all of the above answers are "NO", then may proceed with Cephalosporin use.  Marland Kitchen Penicillins Anaphylaxis and Other (See Comments)    From childhood; uncertain of reaction Has patient had a PCN reaction causing immediate rash, facial/tongue/throat swelling, SOB or lightheadedness with hypotension: Unk Has patient had a PCN reaction causing severe rash involving mucus membranes or skin necrosis: Unk Has patient had a PCN reaction that required hospitalization: Unk Has patient had a PCN reaction occurring within the last 10 years: No If all of the above answers are "NO", then may proceed with Cephalosporin use.  . Vancomycin Other (See Comments)    [place]' syndrome    Family History  Problem Relation Age of Onset  . Coronary artery disease Mother   . Hyperlipidemia Mother   . Hypertension Mother   . Arthritis Mother   . Coronary artery disease Father   . Heart disease Paternal Grandmother   . Alzheimer's disease Paternal Grandfather     Social History   Socioeconomic History  . Marital status: Married    Spouse name: Not on file  . Number of children: Not on file  . Years of education: Not on file  . Highest education level: Not on file  Occupational History  . Occupation: Energy manager: [employer]  Social Needs  . Financial resource strain: Not on file  . Food insecurity    Worry: Not on file    Inability: Not on file  . Transportation needs    Medical: Not on file    Non-medical: Not on file  Tobacco Use  . Smoking status: Never Smoker  . Smokeless tobacco: Never Used  Substance and Sexual Activity  . Alcohol use: Not Currently    Comment: occassional  . Drug use: No  . Sexual  activity: Yes  Lifestyle  . Physical activity    Days per week: Not on file    Minutes per session: Not on file  . Stress: Not on file  Relationships  . Social Herbalist on phone: Not on file    Gets together: Not on file    Attends religious service: Not on file    Active member of club or organization: Not on file    Attends meetings of clubs or organizations: Not on file    Relationship status: Not on file  . Intimate partner violence    Fear of current or ex partner: Not on file  Emotionally abused: Not on file    Physically abused: Not on file    Forced sexual activity: Not on file  Other Topics Concern  . Not on file  Social History Narrative  . Not on file     Constitutional: Pt reports weight gain. Denies fever, malaise, fatigue, headache.  Respiratory: Denies difficulty breathing, shortness of breath, cough or sputum production.   Cardiovascular: Denies chest pain, chest tightness, palpitations or swelling in the hands or feet.    No other specific complaints in a complete review of systems (except as listed in HPI above).  Objective:   Physical Exam  BP 130/90   Pulse (!) 59   Temp 98.3 F (36.8 C) (Temporal)   Wt (!) 305 lb (138.3 kg)   SpO2 97%   BMI 45.04 kg/m   Wt Readings from Last 3 Encounters:  11/26/18 300 lb (136.1 kg)  11/02/18 (!) 301 lb (136.5 kg)  10/29/18 (!) 302 lb 6.4 oz (137.2 kg)    General: Appears his stated age, well developed, well nourished, obese Cardiovascular: Normal rate and rhythm. S1,S2 noted.  No murmur, rubs or gallops noted. No JVD or BLE edema.  Pulmonary/Chest: Normal effort and positive vesicular breath sounds. No respiratory distress. No wheezes, rales or ronchi noted.  Neurological: Alert and oriented.    BMET    Component Value Date/Time   NA 139 06/23/2018 1545   NA 141 10/31/2017 1438   K 4.7 06/23/2018 1545   CL 103 06/23/2018 1545   CO2 31 06/23/2018 1545   GLUCOSE 80 06/23/2018 1545    BUN 18 06/23/2018 1545   BUN 18 10/31/2017 1438   CREATININE 1.27 06/23/2018 1545   CREATININE 1.27 12/06/2015 1600   CALCIUM 9.2 06/23/2018 1545   GFRNONAA 54 (L) 06/09/2018 2115   GFRAA >60 06/09/2018 2115    Lipid Panel     Component Value Date/Time   CHOL 164 06/23/2018 1545   CHOL 170 10/31/2017 1438   TRIG 91.0 06/23/2018 1545   HDL 40.20 06/23/2018 1545   HDL 36 (L) 10/31/2017 1438   CHOLHDL 4 06/23/2018 1545   VLDL 18.2 06/23/2018 1545   LDLCALC 106 (H) 06/23/2018 1545   LDLCALC 108 (H) 10/31/2017 1438    CBC    Component Value Date/Time   WBC 9.5 06/23/2018 1545   RBC 5.23 06/23/2018 1545   HGB 15.1 06/23/2018 1545   HGB 14.0 10/31/2017 1438   HCT 46.0 06/23/2018 1545   HCT 43.5 10/31/2017 1438   PLT 195.0 06/23/2018 1545   PLT 220 10/31/2017 1438   MCV 88.0 06/23/2018 1545   MCV 83 10/31/2017 1438   MCH 27.9 06/09/2018 2115   MCHC 32.8 06/23/2018 1545   RDW 15.4 06/23/2018 1545   RDW 14.4 10/31/2017 1438   LYMPHSABS 2.3 06/09/2018 2115   LYMPHSABS 1.6 10/31/2017 1438   MONOABS 0.8 06/09/2018 2115   EOSABS 0.3 06/09/2018 2115   EOSABS 0.2 10/31/2017 1438   BASOSABS 0.0 06/09/2018 2115   BASOSABS 0.0 10/31/2017 1438    Hgb A1C Lab Results  Component Value Date   HGBA1C 5.7 06/23/2018          Assessment & Plan:   Abnormal Weight Gain:  Continue low carb, moderate protein diet Advised continue cutting back on carbs but also making sure he is eating enough calories throughout the day Try to increase exercise to a total of 150 minutes per week  B12 Deficiency:  B12 injection today  RTC in  1 month for weight check Webb Silversmith, NP

## 2018-12-08 DIAGNOSIS — M531 Cervicobrachial syndrome: Secondary | ICD-10-CM | POA: Diagnosis not present

## 2018-12-08 DIAGNOSIS — S161XXA Strain of muscle, fascia and tendon at neck level, initial encounter: Secondary | ICD-10-CM | POA: Diagnosis not present

## 2018-12-08 DIAGNOSIS — S335XXA Sprain of ligaments of lumbar spine, initial encounter: Secondary | ICD-10-CM | POA: Diagnosis not present

## 2018-12-08 DIAGNOSIS — M5387 Other specified dorsopathies, lumbosacral region: Secondary | ICD-10-CM | POA: Diagnosis not present

## 2018-12-08 DIAGNOSIS — S39012A Strain of muscle, fascia and tendon of lower back, initial encounter: Secondary | ICD-10-CM | POA: Diagnosis not present

## 2018-12-11 ENCOUNTER — Telehealth: Payer: Self-pay | Admitting: *Deleted

## 2018-12-11 NOTE — Telephone Encounter (Signed)
Left message on voicemail for patient to call back. When patient calls back a covid screening needs to done and documented in appointment notes for his nurse visit 12/16/18.

## 2018-12-15 DIAGNOSIS — M5384 Other specified dorsopathies, thoracic region: Secondary | ICD-10-CM | POA: Diagnosis not present

## 2018-12-15 DIAGNOSIS — M9902 Segmental and somatic dysfunction of thoracic region: Secondary | ICD-10-CM | POA: Diagnosis not present

## 2018-12-15 DIAGNOSIS — M4005 Postural kyphosis, thoracolumbar region: Secondary | ICD-10-CM | POA: Diagnosis not present

## 2018-12-16 ENCOUNTER — Ambulatory Visit (INDEPENDENT_AMBULATORY_CARE_PROVIDER_SITE_OTHER): Payer: No Typology Code available for payment source | Admitting: *Deleted

## 2018-12-16 DIAGNOSIS — E538 Deficiency of other specified B group vitamins: Secondary | ICD-10-CM | POA: Diagnosis not present

## 2018-12-16 MED ORDER — CYANOCOBALAMIN 1000 MCG/ML IJ SOLN
1000.0000 ug | Freq: Once | INTRAMUSCULAR | Status: AC
  Administered 2018-12-16: 1000 ug via INTRAMUSCULAR

## 2018-12-16 NOTE — Progress Notes (Signed)
Per orders of R. Baity NP, injection of Vitamin B12 given by Lauralyn Primes. Patient tolerated injection well.

## 2018-12-22 ENCOUNTER — Telehealth: Payer: Self-pay | Admitting: Internal Medicine

## 2018-12-22 NOTE — Telephone Encounter (Signed)
He was supposed to have every 2 weeks for 2 months and monthly for 4 months before rechecking labs

## 2018-12-22 NOTE — Telephone Encounter (Signed)
Patient had his b12 injection on last week.  He wanted to know when he should schedule his next injection Patient was not sure if it was 2 weeks now or every 30 days.

## 2018-12-31 ENCOUNTER — Ambulatory Visit (INDEPENDENT_AMBULATORY_CARE_PROVIDER_SITE_OTHER): Payer: PPO | Admitting: *Deleted

## 2018-12-31 ENCOUNTER — Other Ambulatory Visit: Payer: Self-pay

## 2018-12-31 DIAGNOSIS — E538 Deficiency of other specified B group vitamins: Secondary | ICD-10-CM | POA: Diagnosis not present

## 2018-12-31 MED ORDER — CYANOCOBALAMIN 1000 MCG/ML IJ SOLN
1000.0000 ug | Freq: Once | INTRAMUSCULAR | Status: AC
  Administered 2018-12-31: 15:00:00 1000 ug via INTRAMUSCULAR

## 2018-12-31 NOTE — Progress Notes (Addendum)
Per orders of Regina Baity, NP, injection of B12 1000 mcg/mL given by Henny Strauch. Patient tolerated injection well.  

## 2019-01-03 DIAGNOSIS — G4733 Obstructive sleep apnea (adult) (pediatric): Secondary | ICD-10-CM | POA: Diagnosis not present

## 2019-01-05 ENCOUNTER — Encounter: Payer: Self-pay | Admitting: Family

## 2019-01-05 ENCOUNTER — Ambulatory Visit (INDEPENDENT_AMBULATORY_CARE_PROVIDER_SITE_OTHER): Payer: No Typology Code available for payment source | Admitting: Family

## 2019-01-05 ENCOUNTER — Other Ambulatory Visit: Payer: Self-pay

## 2019-01-05 VITALS — BP 112/70 | HR 53 | Ht 69.0 in | Wt 305.8 lb

## 2019-01-05 DIAGNOSIS — Z7901 Long term (current) use of anticoagulants: Secondary | ICD-10-CM

## 2019-01-05 DIAGNOSIS — Z79899 Other long term (current) drug therapy: Secondary | ICD-10-CM | POA: Diagnosis not present

## 2019-01-05 DIAGNOSIS — I480 Paroxysmal atrial fibrillation: Secondary | ICD-10-CM | POA: Diagnosis not present

## 2019-01-05 DIAGNOSIS — I10 Essential (primary) hypertension: Secondary | ICD-10-CM

## 2019-01-05 DIAGNOSIS — I5032 Chronic diastolic (congestive) heart failure: Secondary | ICD-10-CM | POA: Diagnosis not present

## 2019-01-05 NOTE — Progress Notes (Signed)
Office Visit    Patient Name: Richard [name] Date of Encounter: 01/05/2019  Primary Care Provider:  Jearld Fenton, NP Primary Cardiologist:  Kathlyn Sacramento, MD Electrophysiologist:  None   Chief Complaint    Richard [name] is a 58 y.o. male with a hx of abnormal stress test and normal coronary arteries on cardiac catheterization 2015, HTN, obesity, PAF, LLE DVT, bialteral PE, chronic anticoagulation, sleep apnea, arthritis presents today for 6 month follow up of HTN, PAF.   Past Medical History    Past Medical History:  Diagnosis Date  . Arthritis   . Arthrofibrosis of total knee arthroplasty ([place]) 05/30/2015  . Bilateral pulmonary embolism ([place])    a. 03/2015 CTA: acute bilat PE; b. IVC filter placed in 2017--> removed 2018.  Marland Kitchen Chest pain    a. 2015 Abnl MV;  b. 2015 Cath: nl cors.  . Chronic diastolic CHF (congestive heart failure) ([place])    a. 03/2015 Echo: EF 55-65%, poor windows, mildly dil RV.  Marland Kitchen DVT (deep venous thrombosis) ([place])    a. 03/2015 U/S: occlusive DVT w/in the distal aspect of the Left fem vein through the L popliteal vein.  Marland Kitchen GERD (gastroesophageal reflux disease)   . Headache   . Hypertensive heart disease   . Obesity   . OSA (obstructive sleep apnea)    uses CPAP nightly  . PAF (paroxysmal atrial fibrillation) ([place])   . Primary localized osteoarthritis of left knee    a. 11/2014 s/p L TKA.  . Primary localized osteoarthritis of right knee    a. 02/2014 s/p R Partial Knee arthroplasty.  . Stiffness of left knee    a. 12/2014 s/p manipulation under anesthesia.  . Urine incontinence    Past Surgical History:  Procedure Laterality Date  . APPENDECTOMY    . CARDIAC CATHETERIZATION    . CARPAL TUNNEL RELEASE Left 03/2018  . EXAM UNDER ANESTHESIA WITH MANIPULATION OF KNEE Left 05/30/2015   Procedure: EXAM UNDER ANESTHESIA WITH MANIPULATION OF LEFT KNEE;  Surgeon: Marchia Bond, MD;  Location: [place];  Service: Orthopedics;  Laterality: Left;  . I&D  KNEE WITH POLY EXCHANGE Left 10/30/2015   Procedure: IRRIGATION AND DEBRIDEMENT KNEE WITH POLY EXCHANGE PLACE SPACERS;  Surgeon: Frederik Pear, MD;  Location: [place];  Service: Orthopedics;  Laterality: Left;  OFF ELIQUIST 3 DAYS  . IVC FILTER PLACEMENT ([facility] HX)  04/13/15  . IVC FILTER REMOVAL N/A 08/13/2016   Procedure: IVC Filter Removal;  Surgeon: Katha Cabal, MD;  Location: [place] [facility];  Service: Cardiovascular;  Laterality: N/A;  . JOINT REPLACEMENT    . KNEE ARTHROSCOPY Left 08/08/2015   Procedure: LEFT KNEE MANIPULATION WITH ARTHROSCOPIC LYSIS OF ADHESIONS AND ASPIRATION;  Surgeon: Marchia Bond, MD;  Location: [place];  Service: Orthopedics;  Laterality: Left;  . KNEE CLOSED REDUCTION Left 01/20/2015   Procedure: LEFT KNEE MANIPULATION;  Surgeon: Marchia Bond, MD;  Location: [place];  Service: Orthopedics;  Laterality: Left;  . LEFT HEART CATHETERIZATION WITH CORONARY ANGIOGRAM N/A 01/12/2014   Procedure: LEFT HEART CATHETERIZATION WITH CORONARY ANGIOGRAM;  Surgeon: Jettie Booze, MD;  Location: [facility] CATH [facility];  Service: Cardiovascular;  Laterality: N/A;  . ORTHOPEDIC SURGERY Left    arthroscopy x3  . PARTIAL KNEE ARTHROPLASTY Right 02/25/2014   Procedure: RIGHT KNEE ARTHROPLASTY CONDYLE AND PLATEAU MEDIAL COMPARTMENT ;  Surgeon: Johnny Bridge, MD;  Location: [place];  Service: Orthopedics;  Laterality: Right;  .  PERIPHERAL VASCULAR CATHETERIZATION N/A 03/29/2015   Procedure: IVC Filter Insertion, and possible thrombectomy;  Surgeon: Katha Cabal, MD;  Location: [place] [facility];  Service: Cardiovascular;  Laterality: N/A;  . ROTATOR CUFF REPAIR Left   . TOTAL KNEE ARTHROPLASTY  12/06/2014   Procedure: TOTAL KNEE ARTHROPLASTY;  Surgeon: Marchia Bond, MD;  Location: [place];  Service: Orthopedics;;  . TOTAL KNEE REVISION Left 02/08/2016   Procedure: TOTAL KNEE REVISION;  Surgeon: Frederik Pear, MD;  Location: [place];  Service:  Orthopedics;  Laterality: Left;    Allergies  Allergies  Allergen Reactions  . Penicillin G Other (See Comments)    From childhood; uncertain of reaction Has patient had a PCN reaction causing immediate rash, facial/tongue/throat swelling, SOB or lightheadedness with hypotension: Unk Has patient had a PCN reaction causing severe rash involving mucus membranes or skin necrosis: Unk Has patient had a PCN reaction that required hospitalization: Unk Has patient had a PCN reaction occurring within the last 10 years: No If all of the above answers are "NO", then may proceed with Cephalosporin use.  Marland Kitchen Penicillins Anaphylaxis and Other (See Comments)    From childhood; uncertain of reaction Has patient had a PCN reaction causing immediate rash, facial/tongue/throat swelling, SOB or lightheadedness with hypotension: Unk Has patient had a PCN reaction causing severe rash involving mucus membranes or skin necrosis: Unk Has patient had a PCN reaction that required hospitalization: Unk Has patient had a PCN reaction occurring within the last 10 years: No If all of the above answers are "NO", then may proceed with Cephalosporin use.  . Vancomycin Other (See Comments)    Red Mans' syndrome    History of Present Illness    Richard [name] is a 58 y.o. male with a hx of abnormal stress test and normal coronary arteries on cardiac catheterization 123456, HTN, diastiolic dysfunction, obesity, PAF, LLE DVT, bialteral PE, chronic anticoagulation, sleep apnea, arthritis last seen 06/25/2018 by Ignacia Bayley, NP.  His DVT was post knee surgery which was complicated by bilateral PE and atrial fibrillation now on chronic Eliquis. He has hx of abnormal stress testing and normal coronary arteries by cardiac cath in 2015. His atrial fibrillation has been maintained on Flecainide and Eliquis. He had recurrent atrial fib 12/2016 for which he was treated with IV Metoprolol in the ED. ED visit 06/09/18 with chest pain  reproducible with palpitation. CT angio negative for PE, felt to be costochondritis, discharged with Norco. This pain resolved with Tylenol.  Richard [name] is a very pleasant gentleman. Reports today for 6 month follow up of chest pain, diastolic dysfunction, PAF. Tells me he is finishing up preoperative appointments and plans to have a gastric sleeve done in March or April. He was started on B12 injections by his PCP and reports increase in his energy levels.   Prior to pandemic was going to gym regularly to walk on treadmill. He tracks his steps daily and tells me he currently is walking about a mile per day. Reports his activity is limited by leg pain, but not chest pain nor DOE. He has had 6 knee surgeries on his left knee alone. Reports he spent a year and a half in a wheelchair and is grateful to be able to walk.  Some intermittent episodes of blurry vision and asks if this could be related to his blood pressure. Discussed that it would be more often attributed with high blood pressure, but reports his BP is well controlled at home.  Encouraged to follow up with his eye doctor. Encouraged to check his BP during an episode.  He reports no chest pain, pressure, tightness. He reports a one time occurrence of irregular heart beats - tells me that day he noticed he had forgotten his Flecainide in his pill organizer and the palpitations stopped shortly after he took his Flecainide. He reports no SOB at rest. He tolerates his chronic anticoagulation without hematuria, melena.   He does not routinely monitor his BP or HR at home, but does have capability to do so. Reports no near syncope or syncope. Tells me he only gets lightheaded if he bends over and then stands up quickly. We discussed orthostatic hypotension and being careful with position changes.    EKGs/Labs/Other Studies Reviewed:   The following studies were reviewed today:  Echo 03/27/15 Procedure narrative: Transthoracic echocardiography.  Image   quality was suboptimal. The study was technically difficult, as a   result of poor acoustic windows, poor sound wave transmission,   and body habitus. - Left ventricle: Systolic function was normal. The estimated   ejection fraction was in the range of 55% to 65%. - Aortic valve: Valve area (Vmax): 3.3 cm^2. - Right ventricle: The cavity size was mildly dilated.  EKG:  EKG is ordered today.  The ekg ordered today demonstrates SR with 1st degree AV block and LAFB.  Recent Labs: 06/23/2018: ALT 34; BUN 18; Creatinine, Ser 1.27; Hemoglobin 15.1; Platelets 195.0; Potassium 4.7; Sodium 139  Recent Lipid Panel    Component Value Date/Time   CHOL 164 06/23/2018 1545   CHOL 170 10/31/2017 1438   TRIG 91.0 06/23/2018 1545   HDL 40.20 06/23/2018 1545   HDL 36 (L) 10/31/2017 1438   CHOLHDL 4 06/23/2018 1545   VLDL 18.2 06/23/2018 1545   LDLCALC 106 (H) 06/23/2018 1545   LDLCALC 108 (H) 10/31/2017 1438    Home Medications   Current Meds  Medication Sig  . acetaminophen (TYLENOL) 500 MG tablet Take 1,000 mg by mouth every 6 (six) hours as needed for moderate pain.  Marland Kitchen apixaban (ELIQUIS) 5 MG TABS tablet TAKE 1 TABLET (5 MG TOTAL) BY MOUTH 2 (TWO) TIMES DAILY.  . baclofen (LIORESAL) 10 MG tablet TAKE 1-2 TABLETS (10-20 MG TOTAL) BY MOUTH 3 (THREE) TIMES DAILY AS NEEDED FOR MUSCLE SPASMS.  . flecainide (TAMBOCOR) 100 MG tablet Take 1 tablet (100 mg total) by mouth 2 (two) times daily.  . furosemide (LASIX) 40 MG tablet TAKE 1 TABLET (40 MG TOTAL) BY MOUTH DAILY AS NEEDED. MUST SCHEDULE ANNUAL EXAM  . gabapentin (NEURONTIN) 100 MG capsule Take 1 capsule (100 mg total) by mouth daily as needed. (Patient taking differently: Take 100 mg by mouth daily as needed (for hand pain). )  . HYDROcodone-acetaminophen (NORCO/VICODIN) 5-325 MG tablet Take 1 tablet by mouth every 6 (six) hours as needed for moderate pain.  . metoprolol succinate (TOPROL-XL) 25 MG 24 hr tablet TAKE 1 TABLET BY MOUTH DAILY  WITH OR IMMEDIATELY FOLLOWING A MEAL  . Sennosides-Docusate Sodium (SM STOOL SOFTENER/LAXATIVE PO) Take 1 tablet by mouth at bedtime.  . traMADol (ULTRAM) 50 MG tablet TAKE 2 TABLETS BY MOUTH EVERY NIGHT AT BEDTIME AS NEEDED   Current Facility-Administered Medications for the 01/05/19 encounter (Office Visit) with Loel Dubonnet, NP  Medication  . cyanocobalamin ((VITAMIN B-12)) injection 1,000 mcg  . cyanocobalamin ((VITAMIN B-12)) injection 1,000 mcg      Review of Systems    Review of Systems  Constitution: Negative  for chills, fever and malaise/fatigue.  Cardiovascular: Negative for chest pain, dyspnea on exertion, irregular heartbeat, leg swelling, near-syncope, orthopnea, palpitations and syncope.  Respiratory: Negative for cough, shortness of breath and wheezing.   Musculoskeletal: Positive for joint pain (bilateral knee pain).  Gastrointestinal: Negative for melena, nausea and vomiting.  Genitourinary: Negative for hematuria.  Neurological: Positive for light-headedness ("if I bend over and stand up"). Negative for dizziness and weakness.   All other systems reviewed and are otherwise negative except as noted above.  Physical Exam    VS:  BP 112/70 (BP Location: Left Arm, Patient Position: Sitting, Cuff Size: Large)   Pulse (!) 53   Ht 5\' 9"  (1.753 m)   Wt (!) 305 lb 12 oz (138.7 kg)   SpO2 98%   BMI 45.15 kg/m  , BMI Body mass index is 45.15 kg/m. GEN: Well nourished, overweight, well developed, in no acute distress. HEENT: normal. Neck: Supple, no JVD, carotid bruits, or masses. Cardiac: RRR, no murmurs, rubs, or gallops. No clubbing, cyanosis, edema.  Radials/PT 2+ and equal bilaterally.  Respiratory:  Respirations regular and unlabored, clear to auscultation bilaterally. GI: Soft, nontender, nondistended, BS + x 4. MS: No deformity or atrophy.  Skin: Warm and dry, no rash. Neuro:  Strength and sensation are intact. Psych: Normal affect.  Assessment & Plan     1. PAF - One episode recurrent since last office visit on a day that he forgot his flecainide.  EKG today sinus bradycardia first-degree AV block rate 53 bpm, LAFB.  Continue flecainide and metoprolol.  His bradycardia is asymptomatic with no dizziness, lightheadedness, near syncope.  Anticoagulated on Eliquis.  2. Chronic anticoagulation -secondary to PAF, DVT, PE.  No bleeding complications.  Continue Eliquis 5 mg twice daily.  3. High risk medication use -flecainide secondary to PAF.  Cardiac catheterization 2015 with normal coronary arteries.  No anginal symptoms at this time.  He is appropriately rate limited by metoprolol.  4. HFpEF - Echo 2017 EF 55-60%. No significant dyspnea or edema. BP/HR stable. Continue PRN Lasix.  5. HTN - BP well controlled. Continue present antihypertensive regimen.  6. Obesity - Weight loss encouraged.  He anticipates upcoming gastric sleeve with [place] surgery in March or April.  From a cardiovascular standpoint his exercise tolerance is greater than 4 METS.According to the Revised Cardiac Risk Index (RCRI), his Perioperative Risk of Major Cardiac Event is (%): 0.9. His Functional Capacity in METs is: 4.64 according to the Duke Activity Status Index (DASI) - activity limited by knee pain, not anginal symptoms. Anticipate he will be able to proceed with gastric sleeve without further testing, but will call him to check on him when surgical request is received.  Disposition: Follow up in 6 month(s) with Dr. Fletcher Anon or APP.   Loel Dubonnet, NP 01/05/2019, 12:08 PM

## 2019-01-05 NOTE — Patient Instructions (Signed)
Medication Instructions:  - Your physician recommends that you continue on your current medications as directed. Please refer to the Current Medication list given to you today.  *If you need a refill on your cardiac medications before your next appointment, please call your pharmacy*  Lab Work: - none ordered  If you have labs (blood work) drawn today and your tests are completely normal, you will receive your results only by: Marland Kitchen MyChart Message (if you have MyChart) OR . A paper copy in the mail If you have any lab test that is abnormal or we need to change your treatment, we will call you to review the results.  Testing/Procedures: - none ordered  Follow-Up: At [facility], you and your health needs are our priority.  As part of our continuing mission to provide you with exceptional heart care, we have created designated Provider Care Teams.  These Care Teams include your primary Cardiologist (physician) and Advanced Practice Providers (APPs -  Physician Assistants and Nurse Practitioners) who all work together to provide you with the care you need, when you need it.  Your next appointment:   6 month(s)  The format for your next appointment:   In Person  Provider:    You may see Kathlyn Sacramento, MD or one of the following Advanced Practice Providers on your designated Care Team:    Murray Hodgkins, NP  Christell Faith, PA-C  Marrianne Mood, PA-C   Other Instructions - n/a

## 2019-01-13 DIAGNOSIS — M19071 Primary osteoarthritis, right ankle and foot: Secondary | ICD-10-CM | POA: Diagnosis not present

## 2019-01-20 DIAGNOSIS — Z7689 Persons encountering health services in other specified circumstances: Secondary | ICD-10-CM | POA: Diagnosis not present

## 2019-01-25 ENCOUNTER — Ambulatory Visit: Payer: PPO | Admitting: Internal Medicine

## 2019-01-27 ENCOUNTER — Ambulatory Visit (INDEPENDENT_AMBULATORY_CARE_PROVIDER_SITE_OTHER): Payer: PPO | Admitting: Psychology

## 2019-01-27 DIAGNOSIS — F509 Eating disorder, unspecified: Secondary | ICD-10-CM

## 2019-02-04 ENCOUNTER — Ambulatory Visit (INDEPENDENT_AMBULATORY_CARE_PROVIDER_SITE_OTHER): Payer: No Typology Code available for payment source

## 2019-02-04 ENCOUNTER — Other Ambulatory Visit: Payer: Self-pay

## 2019-02-04 DIAGNOSIS — E538 Deficiency of other specified B group vitamins: Secondary | ICD-10-CM

## 2019-02-04 MED ORDER — CYANOCOBALAMIN 1000 MCG/ML IJ SOLN
1000.0000 ug | Freq: Once | INTRAMUSCULAR | Status: AC
  Administered 2019-02-04: 1000 ug via INTRAMUSCULAR

## 2019-02-04 NOTE — Progress Notes (Signed)
Per orders of NP, Regina Baity, injection of vit B12 given by Karel Turpen. Patient tolerated injection well.  

## 2019-02-08 ENCOUNTER — Other Ambulatory Visit: Payer: Self-pay | Admitting: Internal Medicine

## 2019-02-08 ENCOUNTER — Telehealth: Payer: Self-pay | Admitting: Internal Medicine

## 2019-02-08 NOTE — Telephone Encounter (Signed)
Pt requesting a refill for Baclofen to be sent to [facility]. States he has tried multiple times to call them to refill and they told him to call office.

## 2019-02-09 NOTE — Telephone Encounter (Signed)
It does not look like this has been filled since 2018... please advise

## 2019-02-09 NOTE — Telephone Encounter (Signed)
Can you ask if he takes this daily, as needed or did he stop taking it and is now wanting to restart?

## 2019-02-10 ENCOUNTER — Ambulatory Visit: Payer: PPO | Admitting: Psychology

## 2019-02-10 ENCOUNTER — Telehealth: Payer: Self-pay | Admitting: Internal Medicine

## 2019-02-10 DIAGNOSIS — M76821 Posterior tibial tendinitis, right leg: Secondary | ICD-10-CM | POA: Diagnosis not present

## 2019-02-10 NOTE — Telephone Encounter (Signed)
Pt called stating he has b12 inj 1/14 and wanted to know when he needs to get another one.  2 weeks  Or 1 month??

## 2019-02-16 NOTE — Telephone Encounter (Signed)
Should have been month x 4 months starting from the last inj 02/04/2019

## 2019-02-18 ENCOUNTER — Other Ambulatory Visit: Payer: Self-pay | Admitting: Orthopaedic Surgery

## 2019-02-18 DIAGNOSIS — M76821 Posterior tibial tendinitis, right leg: Secondary | ICD-10-CM

## 2019-02-18 NOTE — Telephone Encounter (Signed)
Appointment 2/18

## 2019-02-22 ENCOUNTER — Other Ambulatory Visit: Payer: Self-pay | Admitting: Cardiovascular Disease

## 2019-02-22 NOTE — Telephone Encounter (Signed)
Refill request

## 2019-02-22 NOTE — Telephone Encounter (Signed)
Pt's age 59, wt 138.7 kg, SCr 1.27, CrCl 124.38, last ov w/ Laurann Montana, NP 12/26/18.

## 2019-02-25 ENCOUNTER — Ambulatory Visit: Payer: No Typology Code available for payment source

## 2019-03-04 ENCOUNTER — Telehealth: Payer: Self-pay | Admitting: Internal Medicine

## 2019-03-04 NOTE — Telephone Encounter (Signed)
Wells Guiles @ [facility] called checking on [facility]der CMN for pt sleep apena machine  She stated this has been faxed 3 times with no response  Last time faxed 2/5  She will refax to [phone]

## 2019-03-09 ENCOUNTER — Other Ambulatory Visit: Payer: Self-pay | Admitting: Cardiovascular Disease

## 2019-03-09 ENCOUNTER — Other Ambulatory Visit: Payer: Self-pay

## 2019-03-09 ENCOUNTER — Ambulatory Visit
Admission: RE | Admit: 2019-03-09 | Discharge: 2019-03-09 | Disposition: A | Discharge status: Home / Self Care | Payer: PRIVATE HEALTH INSURANCE | Source: Ambulatory Visit | Attending: Orthopaedic Surgery | Admitting: Orthopaedic Surgery

## 2019-03-09 DIAGNOSIS — I82492 Acute embolism and thrombosis of other specified deep vein of left lower extremity: Secondary | ICD-10-CM

## 2019-03-09 DIAGNOSIS — M65871 Other synovitis and tenosynovitis, right ankle and foot: Secondary | ICD-10-CM | POA: Diagnosis not present

## 2019-03-09 DIAGNOSIS — M76821 Posterior tibial tendinitis, right leg: Secondary | ICD-10-CM | POA: Diagnosis not present

## 2019-03-09 DIAGNOSIS — I2699 Other pulmonary embolism without acute cor pulmonale: Secondary | ICD-10-CM

## 2019-03-09 DIAGNOSIS — I480 Paroxysmal atrial fibrillation: Secondary | ICD-10-CM

## 2019-03-09 DIAGNOSIS — R0789 Other chest pain: Secondary | ICD-10-CM

## 2019-03-10 ENCOUNTER — Ambulatory Visit (INDEPENDENT_AMBULATORY_CARE_PROVIDER_SITE_OTHER): Payer: PPO | Admitting: Psychology

## 2019-03-10 DIAGNOSIS — F509 Eating disorder, unspecified: Secondary | ICD-10-CM | POA: Diagnosis not present

## 2019-03-11 ENCOUNTER — Ambulatory Visit: Payer: PPO

## 2019-03-15 ENCOUNTER — Other Ambulatory Visit: Payer: Self-pay | Admitting: Internal Medicine

## 2019-03-15 DIAGNOSIS — M76821 Posterior tibial tendinitis, right leg: Secondary | ICD-10-CM | POA: Diagnosis not present

## 2019-03-15 MED ORDER — BACLOFEN 10 MG PO TABS: 10.0000 mg | ORAL_TABLET | Freq: Three times a day (TID) | ORAL | 0 refills | Status: DC | PRN

## 2019-03-15 NOTE — Telephone Encounter (Signed)
Left message on voicemail Richard [name] has sent in refills for medication to be taken as needed

## 2019-03-15 NOTE — Telephone Encounter (Signed)
Tramadol last filled 03/2018 and Baclofen last filled 2018 by you but pt reports he had been getting some from ortho since. Pt states that his arthritis has really been bothering him more recently and he had been taking the Tramadol only if he really needed it and taking muscle relaxer only as needed...Marland Kitchen please advise

## 2019-03-16 ENCOUNTER — Telehealth: Payer: Self-pay | Admitting: *Deleted

## 2019-03-16 DIAGNOSIS — M76821 Posterior tibial tendinitis, right leg: Secondary | ICD-10-CM | POA: Diagnosis not present

## 2019-03-16 NOTE — Telephone Encounter (Signed)
I will be happy to put this into clearance format. I called and left message for Haynes Hoehn, Bariatric navigator to please fax over or call the office with the clearance request with complete information needed.   Thank you Arbie Cookey  ===View-only below this line=== ----- Message ----- From: Isaiah Serge, NP Sent: 03/16/2019   2:09 PM EST To: Cv Div Preop Callback Subject: FW: Cardiac Clearance for Bariatric Surgery    Can you place in format?  Thanks  ----- Message ----- From: Wellington Hampshire, MD Sent: 03/16/2019   1:19 PM EST To: [facility] Preop Subject: FW: Cardiac Clearance for Bariatric Surgery     ----- Message ----- From: Cecelia Byars Sent: 03/16/2019   8:55 AM EST To: Wellington Hampshire, MD Subject: Cardiac Clearance for Bariatric Surgery        Our mutual patient is ready for bariatric surgery.  He will be scheduled most likely April/May 2021 with Dr Johnathan Hausen.  Please provide cardiac clearance as clinically indicated.  Thank you!  Haynes Hoehn, Bariatric Navigator [facility], [place] [address] [place], [place] [address] Phone:  [phone] Fax: [phone]

## 2019-03-17 NOTE — Telephone Encounter (Signed)
I spoke to pt and he stated that he was trying to find a new supply company as this company keeps sending him the supplies for a different machine and he has a Engineer, technical sales

## 2019-03-18 ENCOUNTER — Other Ambulatory Visit: Payer: Self-pay

## 2019-03-18 ENCOUNTER — Ambulatory Visit (INDEPENDENT_AMBULATORY_CARE_PROVIDER_SITE_OTHER): Payer: No Typology Code available for payment source

## 2019-03-18 DIAGNOSIS — E538 Deficiency of other specified B group vitamins: Secondary | ICD-10-CM | POA: Diagnosis not present

## 2019-03-18 MED ORDER — CYANOCOBALAMIN 1000 MCG/ML IJ SOLN
1000.0000 ug | Freq: Once | INTRAMUSCULAR | Status: AC
  Administered 2019-03-18: 1000 ug via INTRAMUSCULAR

## 2019-03-18 NOTE — Telephone Encounter (Signed)
Hold Eliquis 2 days before surgery and resume as soon as safe postoperatively.

## 2019-03-18 NOTE — Telephone Encounter (Signed)
Patient is returning call.  °

## 2019-03-18 NOTE — Telephone Encounter (Signed)
Patient with diagnosis of afib on Eliquis for anticoagulation.    Procedure:  LAPAROSCOPIC SLEEVE GASTRECTOMY w/UPPER ENDOCSCOPY Date of procedure: TBD  CHADS2-VASc score of  4 (CHF, HTN, VTEx2)   Bilateral PE/DVT 2017 post knee surgery  Normal renal function  Due to patient history of DVT/bilateral PE (provoked) I will defer to Dr. Fletcher Anon.

## 2019-03-18 NOTE — Telephone Encounter (Signed)
   [facility] Pre-operative Risk Assessment    Request for surgical clearance:  1. What type of surgery is being performed? LAPAROSCOPIC SLEEVE GASTRECTOMY w/UPPER ENDOCSCOPY  2. When is this surgery scheduled? TBD   3. What type of clearance is required (medical clearance vs. Pharmacy clearance to hold med vs. Both)? BOTH  4. Are there any medications that need to be held prior to surgery and how long? ELIQUIS WITH POSSIBLE NEED FOR LOVENOX BRIDGE?   5. Practice name and name of physician performing surgery? [facility]; DR. Rodman Key MARTIN   6. What is your office phone number [phone]    7.   What is your office fax number [phone] ATTN: Joyice Faster, BARIATRIC NAVIGATOR  8.   Anesthesia type (None, local, MAC, general) ? GENERAL   Julaine Hua 03/18/2019, 9:21 AM  _________________________________________________________________   (provider comments below)

## 2019-03-18 NOTE — Telephone Encounter (Signed)
Left voice mail to call back 

## 2019-03-18 NOTE — Progress Notes (Signed)
Per orders of NP, [facility] injection of vit 123456 given by Brenton Grills. Patient tolerated injection well.

## 2019-03-19 NOTE — Telephone Encounter (Signed)
   Primary Cardiologist: Kathlyn Sacramento, MD  Chart reviewed as part of pre-operative protocol coverage. Given past medical history and time since last visit, based on ACC/AHA guidelines, [place] would be at acceptable risk for the planned procedure without further cardiovascular testing.   I will route this recommendation to the requesting party via Epic fax function and remove from pre-op pool.  Please call with questions.  [place], [place] 03/19/2019, 9:30 AM

## 2019-04-01 ENCOUNTER — Encounter: Payer: Self-pay | Admitting: Internal Medicine

## 2019-04-01 ENCOUNTER — Other Ambulatory Visit: Payer: Self-pay

## 2019-04-01 ENCOUNTER — Ambulatory Visit (INDEPENDENT_AMBULATORY_CARE_PROVIDER_SITE_OTHER): Payer: No Typology Code available for payment source | Admitting: Internal Medicine

## 2019-04-01 VITALS — BP 136/84 | HR 68 | Temp 98.2°F | Wt 304.0 lb

## 2019-04-01 DIAGNOSIS — R1031 Right lower quadrant pain: Secondary | ICD-10-CM

## 2019-04-01 DIAGNOSIS — R1032 Left lower quadrant pain: Secondary | ICD-10-CM | POA: Diagnosis not present

## 2019-04-01 DIAGNOSIS — M545 Low back pain, unspecified: Secondary | ICD-10-CM

## 2019-04-01 DIAGNOSIS — S39011A Strain of muscle, fascia and tendon of abdomen, initial encounter: Secondary | ICD-10-CM

## 2019-04-01 MED ORDER — DEXAMETHASONE SODIUM PHOSPHATE 10 MG/ML IJ SOLN
10.0000 mg | Freq: Once | INTRAMUSCULAR | Status: AC
  Administered 2019-04-01: 10 mg via INTRAMUSCULAR

## 2019-04-01 NOTE — Progress Notes (Signed)
Subjective:    Patient ID: Richard [name], male    DOB: 1960/11/04, 59 y.o.   MRN: [id number]  HPI  Pt presents to the clinic today with c/o low back pain and  bilateral groin pain s/p a fall that occurred 1 week ago after tripping over a cinderblock. He describes the pain as sore. The pain does not radiate. It is worse with movement. He denies numbness, tingling or weakness of his lower extremities. He denies issues with bowel or bladder. He has been seeing the chiropractor with minimal relief. He is taking Baclofen and Tramadol as prescribed.   Review of Systems      Past Medical History:  Diagnosis Date  . Arthritis   . Arthrofibrosis of total knee arthroplasty ([place]) 05/30/2015  . Bilateral pulmonary embolism ([place])    a. 03/2015 CTA: acute bilat PE; b. IVC filter placed in 2017--> removed 2018.  Marland Kitchen Chest pain    a. 2015 Abnl MV;  b. 2015 Cath: nl cors.  . Chronic diastolic CHF (congestive heart failure) ([place])    a. 03/2015 Echo: EF 55-65%, poor windows, mildly dil RV.  Marland Kitchen DVT (deep venous thrombosis) ([place])    a. 03/2015 U/S: occlusive DVT w/in the distal aspect of the Left fem vein through the L popliteal vein.  Marland Kitchen GERD (gastroesophageal reflux disease)   . Headache   . Hypertensive heart disease   . Obesity   . OSA (obstructive sleep apnea)    uses CPAP nightly  . PAF (paroxysmal atrial fibrillation) ([place])   . Primary localized osteoarthritis of left knee    a. 11/2014 s/p L TKA.  . Primary localized osteoarthritis of right knee    a. 02/2014 s/p R Partial Knee arthroplasty.  . Stiffness of left knee    a. 12/2014 s/p manipulation under anesthesia.  . Urine incontinence     Current Outpatient Medications  Medication Sig Dispense Refill  . acetaminophen (TYLENOL) 500 MG tablet Take 1,000 mg by mouth every 6 (six) hours as needed for moderate pain.    . baclofen (LIORESAL) 10 MG tablet Take 1-2 tablets (10-20 mg total) by mouth 3 (three) times daily as needed for muscle  spasms. 180 tablet 0  . ELIQUIS 5 MG TABS tablet TAKE 1 TABLET BY MOUTH 2 TIMES DAILY 60 tablet 6  . flecainide (TAMBOCOR) 100 MG tablet TAKE 1 TABLET BY MOUTH TWICE (2) DAILY 180 tablet 0  . furosemide (LASIX) 40 MG tablet TAKE 1 TABLET (40 MG TOTAL) BY MOUTH DAILY AS NEEDED. MUST SCHEDULE ANNUAL EXAM 30 tablet 0  . gabapentin (NEURONTIN) 100 MG capsule Take 1 capsule (100 mg total) by mouth daily as needed. (Patient taking differently: Take 100 mg by mouth daily as needed (for hand pain). ) 30 capsule 2  . HYDROcodone-acetaminophen (NORCO/VICODIN) 5-325 MG tablet Take 1 tablet by mouth every 6 (six) hours as needed for moderate pain. 15 tablet 0  . metoprolol succinate (TOPROL-XL) 25 MG 24 hr tablet TAKE 1 TABLET BY MOUTH DAILY WITH OR IMMEDIATELY FOLLOWING A MEAL 90 tablet 3  . Sennosides-Docusate Sodium (SM STOOL SOFTENER/LAXATIVE PO) Take 1 tablet by mouth at bedtime.    . traMADol (ULTRAM) 50 MG tablet TAKE 2 TABLETS BY MOUTH AT BEDTIME AS NEEDED 60 tablet 0   Current Facility-Administered Medications  Medication Dose Route Frequency Provider Last Rate Last Admin  . cyanocobalamin ((VITAMIN B-12)) injection 1,000 mcg  1,000 mcg Intramuscular Q14 Days Jearld Fenton, NP   1,000 mcg  at 04/08/18 0858  . cyanocobalamin ((VITAMIN B-12)) injection 1,000 mcg  1,000 mcg Intramuscular Once Jearld Fenton, NP        Allergies  Allergen Reactions  . Penicillin G Other (See Comments)    From childhood; uncertain of reaction Has patient had a PCN reaction causing immediate rash, facial/tongue/throat swelling, SOB or lightheadedness with hypotension: Unk Has patient had a PCN reaction causing severe rash involving mucus membranes or skin necrosis: Unk Has patient had a PCN reaction that required hospitalization: Unk Has patient had a PCN reaction occurring within the last 10 years: No If all of the above answers are "NO", then may proceed with Cephalosporin use.  Marland Kitchen Penicillins Anaphylaxis and  Other (See Comments)    From childhood; uncertain of reaction Has patient had a PCN reaction causing immediate rash, facial/tongue/throat swelling, SOB or lightheadedness with hypotension: Unk Has patient had a PCN reaction causing severe rash involving mucus membranes or skin necrosis: Unk Has patient had a PCN reaction that required hospitalization: Unk Has patient had a PCN reaction occurring within the last 10 years: No If all of the above answers are "NO", then may proceed with Cephalosporin use.  . Vancomycin Other (See Comments)    Red Mans' syndrome    Family History  Problem Relation Age of Onset  . Coronary artery disease Mother   . Hyperlipidemia Mother   . Hypertension Mother   . Arthritis Mother   . Coronary artery disease Father   . Heart disease Paternal Grandmother   . Alzheimer's disease Paternal Grandfather     Social History   Socioeconomic History  . Marital status: Married    Spouse name: Not on file  . Number of children: Not on file  . Years of education: Not on file  . Highest education level: Not on file  Occupational History  . Occupation: Energy manager: [employer]  Tobacco Use  . Smoking status: Never Smoker  . Smokeless tobacco: Never Used  Substance and Sexual Activity  . Alcohol use: Not Currently    Comment: occassional  . Drug use: No  . Sexual activity: Yes  Other Topics Concern  . Not on file  Social History Narrative  . Not on file   Social Determinants of Health   Financial Resource Strain:   . Difficulty of Paying Living Expenses:   Food Insecurity:   . Worried About Charity fundraiser in the Last Year:   . Arboriculturist in the Last Year:   Transportation Needs:   . Film/video editor (Medical):   Marland Kitchen Lack of Transportation (Non-Medical):   Physical Activity:   . Days of Exercise per Week:   . Minutes of Exercise per Session:   Stress:   . Feeling of Stress :   Social Connections:   . Frequency of  Communication with Friends and Family:   . Frequency of Social Gatherings with Friends and Family:   . Attends Religious Services:   . Active Member of Clubs or Organizations:   . Attends Archivist Meetings:   Marland Kitchen Marital Status:   Intimate Partner Violence:   . Fear of Current or Ex-Partner:   . Emotionally Abused:   Marland Kitchen Physically Abused:   . Sexually Abused:      Constitutional: Denies fever, malaise, fatigue, headache or abrupt weight changes.  Respiratory: Denies difficulty breathing, shortness of breath, cough or sputum production.   Cardiovascular: Denies chest pain, chest tightness, palpitations  or swelling in the hands or feet.  Gastrointestinal: Denies abdominal pain, bloating, constipation, diarrhea or blood in the stool.  GU: Denies urgency, frequency, pain with urination, burning sensation, blood in urine, odor or discharge. Musculoskeletal: Pt reports low back and bilateral groin pain.. Denies decrease in range of motion, difficulty with gait, or joint swelling.  Skin: Denies redness, rashes, lesions or ulcercations.    No other specific complaints in a complete review of systems (except as listed in HPI above).   Objective:   Physical Exam  BP 136/84   Pulse 68   Temp 98.2 F (36.8 C) (Temporal)   Wt (!) 304 lb (137.9 kg)   SpO2 98%   BMI 44.89 kg/m   Wt Readings from Last 3 Encounters:  01/05/19 (!) 305 lb 12 oz (138.7 kg)  12/01/18 (!) 305 lb (138.3 kg)  11/26/18 300 lb (136.1 kg)    General: Appears his stated age, obese, in NAD. Skin: Warm, dry and intact. No bruising or abrasion noted. Cardiovascular: Normal rate and rhythm. S1,S2 noted.  No murmur, rubs or gallops noted.  Pulmonary/Chest: Normal effort and positive vesicular breath sounds. No respiratory distress. No wheezes, rales or ronchi noted.  Musculoskeletal: Normal flexion, extension, rotation and lateral bending of the spine. Bony tenderness noted over the lumbar spine. Normal  internal and external rotation of bilateral hips. No pain with palpation of the hips. He points to bilateral external obliques as the site of his pain. Strength 5/5 BLE. Right leg in boot. No difficulty with gait.  Neurological: Alert and oriented.    BMET    Component Value Date/Time   NA 139 06/23/2018 1545   NA 141 10/31/2017 1438   K 4.7 06/23/2018 1545   CL 103 06/23/2018 1545   CO2 31 06/23/2018 1545   GLUCOSE 80 06/23/2018 1545   BUN 18 06/23/2018 1545   BUN 18 10/31/2017 1438   CREATININE 1.27 06/23/2018 1545   CREATININE 1.27 12/06/2015 1600   CALCIUM 9.2 06/23/2018 1545   GFRNONAA 54 (L) 06/09/2018 2115   GFRAA >60 06/09/2018 2115    Lipid Panel     Component Value Date/Time   CHOL 164 06/23/2018 1545   CHOL 170 10/31/2017 1438   TRIG 91.0 06/23/2018 1545   HDL 40.20 06/23/2018 1545   HDL 36 (L) 10/31/2017 1438   CHOLHDL 4 06/23/2018 1545   VLDL 18.2 06/23/2018 1545   LDLCALC 106 (H) 06/23/2018 1545   LDLCALC 108 (H) 10/31/2017 1438    CBC    Component Value Date/Time   WBC 9.5 06/23/2018 1545   RBC 5.23 06/23/2018 1545   HGB 15.1 06/23/2018 1545   HGB 14.0 10/31/2017 1438   HCT 46.0 06/23/2018 1545   HCT 43.5 10/31/2017 1438   PLT 195.0 06/23/2018 1545   PLT 220 10/31/2017 1438   MCV 88.0 06/23/2018 1545   MCV 83 10/31/2017 1438   MCH 27.9 06/09/2018 2115   MCHC 32.8 06/23/2018 1545   RDW 15.4 06/23/2018 1545   RDW 14.4 10/31/2017 1438   LYMPHSABS 2.3 06/09/2018 2115   LYMPHSABS 1.6 10/31/2017 1438   MONOABS 0.8 06/09/2018 2115   EOSABS 0.3 06/09/2018 2115   EOSABS 0.2 10/31/2017 1438   BASOSABS 0.0 06/09/2018 2115   BASOSABS 0.0 10/31/2017 1438    Hgb A1C Lab Results  Component Value Date   HGBA1C 5.7 06/23/2018           Assessment & Plan:   Acute Low Back Pain, Abdominal Wall  Muscle Strain:  Decadron 10 mg IM today Continue Baclofen and Tramadol Pain will likely improve with time  Return precautions discussed Webb Silversmith, NP This visit occurred during the SARS-CoV-2 public health emergency.  Safety protocols were in place, including screening questions prior to the visit, additional usage of staff PPE, and extensive cleaning of exam room while observing appropriate contact time as indicated for disinfecting solutions.

## 2019-04-02 NOTE — Patient Instructions (Signed)

## 2019-04-28 ENCOUNTER — Ambulatory Visit: Payer: No Typology Code available for payment source | Admitting: Primary Care

## 2019-05-04 ENCOUNTER — Other Ambulatory Visit: Payer: Self-pay

## 2019-05-04 ENCOUNTER — Encounter: Payer: Self-pay | Admitting: Internal Medicine

## 2019-05-04 ENCOUNTER — Ambulatory Visit (INDEPENDENT_AMBULATORY_CARE_PROVIDER_SITE_OTHER)
Admission: RE | Admit: 2019-05-04 | Discharge: 2019-05-04 | Disposition: A | Discharge status: Home / Self Care | Payer: No Typology Code available for payment source | Source: Ambulatory Visit | Attending: Internal Medicine | Admitting: Internal Medicine

## 2019-05-04 ENCOUNTER — Ambulatory Visit (INDEPENDENT_AMBULATORY_CARE_PROVIDER_SITE_OTHER): Payer: No Typology Code available for payment source | Admitting: Internal Medicine

## 2019-05-04 VITALS — BP 124/76 | HR 51 | Temp 97.8°F | Wt 308.0 lb

## 2019-05-04 DIAGNOSIS — W19XXXD Unspecified fall, subsequent encounter: Secondary | ICD-10-CM | POA: Diagnosis not present

## 2019-05-04 DIAGNOSIS — S79912A Unspecified injury of left hip, initial encounter: Secondary | ICD-10-CM | POA: Diagnosis not present

## 2019-05-04 DIAGNOSIS — M25551 Pain in right hip: Secondary | ICD-10-CM | POA: Diagnosis not present

## 2019-05-04 DIAGNOSIS — M25552 Pain in left hip: Secondary | ICD-10-CM

## 2019-05-04 DIAGNOSIS — S79911A Unspecified injury of right hip, initial encounter: Secondary | ICD-10-CM | POA: Diagnosis not present

## 2019-05-04 DIAGNOSIS — E538 Deficiency of other specified B group vitamins: Secondary | ICD-10-CM | POA: Diagnosis not present

## 2019-05-04 MED ORDER — CYANOCOBALAMIN 1000 MCG/ML IJ SOLN
1000.0000 ug | Freq: Once | INTRAMUSCULAR | Status: AC
  Administered 2019-05-04: 18:00:00 1000 ug via INTRAMUSCULAR

## 2019-05-04 NOTE — Addendum Note (Signed)
Addended by: Lurlean Nanny on: 05/04/2019 05:30 PM   Modules accepted: Orders

## 2019-05-04 NOTE — Progress Notes (Signed)
Subjective:    Patient ID: Richard [name], male    DOB: 05/09/1960, 59 y.o.   MRN: [id number]  HPI  Pt presents to the clinic today with c/o bilateral hip pain. This has been ongoing since a fall that occurred 3/11. He was seen for the same, diagnosed with low back pain, muscle strain of groin. He was given a steroid injection, advised to continue Baclofen and Tramadol. He reports the pain actually worsened last week but he has not had any pain in the last 2 days. He describes the pain as now sharp instead of achy. The pain does not radiate.He reports bilateral groin pain, mainly when coughing. He denies numbness, tingling or weakness in the lower extremities.   He would like his B12 injection today.  Review of Systems  Past Medical History:  Diagnosis Date  . Arthritis   . Arthrofibrosis of total knee arthroplasty ([place]) 05/30/2015  . Bilateral pulmonary embolism ([place])    a. 03/2015 CTA: acute bilat PE; b. IVC filter placed in 2017--> removed 2018.  Marland Kitchen Chest pain    a. 2015 Abnl MV;  b. 2015 Cath: nl cors.  . Chronic diastolic CHF (congestive heart failure) ([place])    a. 03/2015 Echo: EF 55-65%, poor windows, mildly dil RV.  Marland Kitchen DVT (deep venous thrombosis) ([place])    a. 03/2015 U/S: occlusive DVT w/in the distal aspect of the Left fem vein through the L popliteal vein.  Marland Kitchen GERD (gastroesophageal reflux disease)   . Headache   . Hypertensive heart disease   . Obesity   . OSA (obstructive sleep apnea)    uses CPAP nightly  . PAF (paroxysmal atrial fibrillation) ([place])   . Primary localized osteoarthritis of left knee    a. 11/2014 s/p L TKA.  . Primary localized osteoarthritis of right knee    a. 02/2014 s/p R Partial Knee arthroplasty.  . Stiffness of left knee    a. 12/2014 s/p manipulation under anesthesia.  . Urine incontinence     Current Outpatient Medications  Medication Sig Dispense Refill  . acetaminophen (TYLENOL) 500 MG tablet Take 1,000 mg by mouth every 6 (six) hours as  needed for moderate pain.    . baclofen (LIORESAL) 10 MG tablet Take 1-2 tablets (10-20 mg total) by mouth 3 (three) times daily as needed for muscle spasms. 180 tablet 0  . ELIQUIS 5 MG TABS tablet TAKE 1 TABLET BY MOUTH 2 TIMES DAILY 60 tablet 6  . flecainide (TAMBOCOR) 100 MG tablet TAKE 1 TABLET BY MOUTH TWICE (2) DAILY 180 tablet 0  . furosemide (LASIX) 40 MG tablet TAKE 1 TABLET (40 MG TOTAL) BY MOUTH DAILY AS NEEDED. MUST SCHEDULE ANNUAL EXAM 30 tablet 0  . gabapentin (NEURONTIN) 100 MG capsule Take 1 capsule (100 mg total) by mouth daily as needed. (Patient taking differently: Take 100 mg by mouth daily as needed (for hand pain). ) 30 capsule 2  . HYDROcodone-acetaminophen (NORCO/VICODIN) 5-325 MG tablet Take 1 tablet by mouth every 6 (six) hours as needed for moderate pain. 15 tablet 0  . metoprolol succinate (TOPROL-XL) 25 MG 24 hr tablet TAKE 1 TABLET BY MOUTH DAILY WITH OR IMMEDIATELY FOLLOWING A MEAL 90 tablet 3  . Sennosides-Docusate Sodium (SM STOOL SOFTENER/LAXATIVE PO) Take 1 tablet by mouth at bedtime.    . traMADol (ULTRAM) 50 MG tablet TAKE 2 TABLETS BY MOUTH AT BEDTIME AS NEEDED 60 tablet 0   Current Facility-Administered Medications  Medication Dose Route Frequency Provider Last  Rate Last Admin  . cyanocobalamin ((VITAMIN B-12)) injection 1,000 mcg  1,000 mcg Intramuscular Q14 Days Jearld Fenton, NP   1,000 mcg at 04/08/18 0858  . cyanocobalamin ((VITAMIN B-12)) injection 1,000 mcg  1,000 mcg Intramuscular Once Jearld Fenton, NP        Allergies  Allergen Reactions  . Penicillin G Other (See Comments)    From childhood; uncertain of reaction Has patient had a PCN reaction causing immediate rash, facial/tongue/throat swelling, SOB or lightheadedness with hypotension: Unk Has patient had a PCN reaction causing severe rash involving mucus membranes or skin necrosis: Unk Has patient had a PCN reaction that required hospitalization: Unk Has patient had a PCN reaction  occurring within the last 10 years: No If all of the above answers are "NO", then may proceed with Cephalosporin use.  Marland Kitchen Penicillins Anaphylaxis and Other (See Comments)    From childhood; uncertain of reaction Has patient had a PCN reaction causing immediate rash, facial/tongue/throat swelling, SOB or lightheadedness with hypotension: Unk Has patient had a PCN reaction causing severe rash involving mucus membranes or skin necrosis: Unk Has patient had a PCN reaction that required hospitalization: Unk Has patient had a PCN reaction occurring within the last 10 years: No If all of the above answers are "NO", then may proceed with Cephalosporin use.  . Vancomycin Other (See Comments)    Red Mans' syndrome    Family History  Problem Relation Age of Onset  . Coronary artery disease Mother   . Hyperlipidemia Mother   . Hypertension Mother   . Arthritis Mother   . Coronary artery disease Father   . Heart disease Paternal Grandmother   . Alzheimer's disease Paternal Grandfather     Social History   Socioeconomic History  . Marital status: Married    Spouse name: Not on file  . Number of children: Not on file  . Years of education: Not on file  . Highest education level: Not on file  Occupational History  . Occupation: Energy manager: [employer]  Tobacco Use  . Smoking status: Never Smoker  . Smokeless tobacco: Never Used  Substance and Sexual Activity  . Alcohol use: Not Currently    Comment: occassional  . Drug use: No  . Sexual activity: Yes  Other Topics Concern  . Not on file  Social History Narrative  . Not on file   Social Determinants of Health   Financial Resource Strain:   . Difficulty of Paying Living Expenses:   Food Insecurity:   . Worried About Charity fundraiser in the Last Year:   . Arboriculturist in the Last Year:   Transportation Needs:   . Film/video editor (Medical):   Marland Kitchen Lack of Transportation (Non-Medical):   Physical Activity:   .  Days of Exercise per Week:   . Minutes of Exercise per Session:   Stress:   . Feeling of Stress :   Social Connections:   . Frequency of Communication with Friends and Family:   . Frequency of Social Gatherings with Friends and Family:   . Attends Religious Services:   . Active Member of Clubs or Organizations:   . Attends Archivist Meetings:   Marland Kitchen Marital Status:   Intimate Partner Violence:   . Fear of Current or Ex-Partner:   . Emotionally Abused:   Marland Kitchen Physically Abused:   . Sexually Abused:      Constitutional: Denies fever, malaise, fatigue, headache  or abrupt weight changes.  Respiratory: Denies difficulty breathing, shortness of breath, cough or sputum production.   Cardiovascular: Denies chest pain, chest tightness, palpitations or swelling in the hands or feet.  Musculoskeletal: Pt reports bilateral hip and groin pain. Denies decrease in range of motion, difficulty with gait, or joint swelling.  Neurological: Denies numbness, tingling, weakness or problems with balance and coordination.    No other specific complaints in a complete review of systems (except as listed in HPI above).     Objective:   Physical Exam BP 124/76   Pulse (!) 51   Temp 97.8 F (36.6 C) (Temporal)   Wt (!) 308 lb (139.7 kg)   SpO2 96%   BMI 45.48 kg/m   Wt Readings from Last 3 Encounters:  04/01/19 (!) 304 lb (137.9 kg)  01/05/19 (!) 305 lb 12 oz (138.7 kg)  12/01/18 (!) 305 lb (138.3 kg)    General: Appears his stated age, obese, in NAD. Cardiovascular: Normal rate and rhythm. S1,S2 noted.  No murmur, rubs or gallops noted.  Pulmonary/Chest: Normal effort and positive vesicular breath sounds. No respiratory distress. No wheezes, rales or ronchi noted.  Musculoskeletal: Normal flexion, extension, abduction and adduction of the hips. Pain with internal rotation of the right hip. No pain with palpation of the hips. Strength 5/5 BLE. No difficulty with gait.  Neurological: Alert  and oriented.    BMET    Component Value Date/Time   NA 139 06/23/2018 1545   NA 141 10/31/2017 1438   K 4.7 06/23/2018 1545   CL 103 06/23/2018 1545   CO2 31 06/23/2018 1545   GLUCOSE 80 06/23/2018 1545   BUN 18 06/23/2018 1545   BUN 18 10/31/2017 1438   CREATININE 1.27 06/23/2018 1545   CREATININE 1.27 12/06/2015 1600   CALCIUM 9.2 06/23/2018 1545   GFRNONAA 54 (L) 06/09/2018 2115   GFRAA >60 06/09/2018 2115    Lipid Panel     Component Value Date/Time   CHOL 164 06/23/2018 1545   CHOL 170 10/31/2017 1438   TRIG 91.0 06/23/2018 1545   HDL 40.20 06/23/2018 1545   HDL 36 (L) 10/31/2017 1438   CHOLHDL 4 06/23/2018 1545   VLDL 18.2 06/23/2018 1545   LDLCALC 106 (H) 06/23/2018 1545   LDLCALC 108 (H) 10/31/2017 1438    CBC    Component Value Date/Time   WBC 9.5 06/23/2018 1545   RBC 5.23 06/23/2018 1545   HGB 15.1 06/23/2018 1545   HGB 14.0 10/31/2017 1438   HCT 46.0 06/23/2018 1545   HCT 43.5 10/31/2017 1438   PLT 195.0 06/23/2018 1545   PLT 220 10/31/2017 1438   MCV 88.0 06/23/2018 1545   MCV 83 10/31/2017 1438   MCH 27.9 06/09/2018 2115   MCHC 32.8 06/23/2018 1545   RDW 15.4 06/23/2018 1545   RDW 14.4 10/31/2017 1438   LYMPHSABS 2.3 06/09/2018 2115   LYMPHSABS 1.6 10/31/2017 1438   MONOABS 0.8 06/09/2018 2115   EOSABS 0.3 06/09/2018 2115   EOSABS 0.2 10/31/2017 1438   BASOSABS 0.0 06/09/2018 2115   BASOSABS 0.0 10/31/2017 1438    Hgb A1C Lab Results  Component Value Date   HGBA1C 5.7 06/23/2018           Assessment & Plan:  Bilateral Hip Pain s/p Fall:  Improved now Will obtain xray bilateral hips Continue Baclofen and Tramadol Encouraged stretching  B12 Deficiency:  B12 injection today by CMA    Will follow up after xray, return precautions  discussed  Webb Silversmith, NP This visit occurred during the SARS-CoV-2 public health emergency.  Safety protocols were in place, including screening questions prior to the visit, additional  usage of staff PPE, and extensive cleaning of exam room while observing appropriate contact time as indicated for disinfecting solutions.

## 2019-05-04 NOTE — Patient Instructions (Signed)
Hip Exercises Ask your health care provider which exercises are safe for you. Do exercises exactly as told by your health care provider and adjust them as directed. It is normal to feel mild stretching, pulling, tightness, or discomfort as you do these exercises. Stop right away if you feel sudden pain or your pain gets worse. Do not begin these exercises until told by your health care provider. Stretching and range-of-motion exercises These exercises warm up your muscles and joints and improve the movement and flexibility of your hip. These exercises also help to relieve pain, numbness, and tingling. You may be asked to limit your range of motion if you had a hip replacement. Talk to your health care provider about these restrictions. Hamstrings, supine  1. Lie on your back (supine position). 2. Loop a belt or towel over the ball of your left / right foot. The ball of your foot is on the walking surface, right under your toes. 3. Straighten your left / right knee and slowly pull on the belt or towel to raise your leg until you feel a gentle stretch behind your knee (hamstring). ? Do not let your knee bend while you do this. ? Keep your other leg flat on the floor. 4. Hold this position for __________ seconds. 5. Slowly return your leg to the starting position. Repeat __________ times. Complete this exercise __________ times a day. Hip rotation  1. Lie on your back on a firm surface. 2. With your left / right hand, gently pull your left / right knee toward the shoulder that is on the same side of the body. Stop when your knee is pointing toward the ceiling. 3. Hold your left / right ankle with your other hand. 4. Keeping your knee steady, gently pull your left / right ankle toward your other shoulder until you feel a stretch in your buttocks. ? Keep your hips and shoulders firmly planted while you do this stretch. 5. Hold this position for __________ seconds. Repeat __________ times. Complete  this exercise __________ times a day. Seated stretch This exercise is sometimes called hamstrings and adductors stretch. 1. Sit on the floor with your legs stretched wide. Keep your knees straight during this exercise. 2. Keeping your head and back in a straight line, bend at your waist to reach for your left foot (position A). You should feel a stretch in your right inner thigh (adductors). 3. Hold this position for __________ seconds. Then slowly return to the upright position. 4. Keeping your head and back in a straight line, bend at your waist to reach forward (position B). You should feel a stretch behind both of your thighs and knees (hamstrings). 5. Hold this position for __________ seconds. Then slowly return to the upright position. 6. Keeping your head and back in a straight line, bend at your waist to reach for your right foot (position C). You should feel a stretch in your left inner thigh (adductors). 7. Hold this position for __________ seconds. Then slowly return to the upright position. Repeat __________ times. Complete this exercise __________ times a day. Lunge This exercise stretches the muscles of the hip (hip flexors). 1. Place your left / right knee on the floor and bend your other knee so that is directly over your ankle. You should be half-kneeling. 2. Keep good posture with your head over your shoulders. 3. Tighten your buttocks to point your tailbone downward. This will prevent your back from arching too much. 4. You should feel a   gentle stretch in the front of your left / right thigh and hip. If you do not feel a stretch, slide your other foot forward slightly and then slowly lunge forward with your chest up until your knee once again lines up over your ankle. ? Make sure your tailbone continues to point downward. 5. Hold this position for __________ seconds. 6. Slowly return to the starting position. Repeat __________ times. Complete this exercise __________ times a  day. Strengthening exercises These exercises build strength and endurance in your hip. Endurance is the ability to use your muscles for a long time, even after they get tired. Bridge This exercise strengthens the muscles of your hip (hip extensors). 1. Lie on your back on a firm surface with your knees bent and your feet flat on the floor. 2. Tighten your buttocks muscles and lift your bottom off the floor until the trunk of your body and your hips are level with your thighs. ? Do not arch your back. ? You should feel the muscles working in your buttocks and the back of your thighs. If you do not feel these muscles, slide your feet 1-2 inches (2.5-5 cm) farther away from your buttocks. 3. Hold this position for __________ seconds. 4. Slowly lower your hips to the starting position. 5. Let your muscles relax completely between repetitions. Repeat __________ times. Complete this exercise __________ times a day. Straight leg raises, side-lying This exercise strengthens the muscles that move the hip joint away from the center of the body (hip abductors). 1. Lie on your side with your left / right leg in the top position. Lie so your head, shoulder, hip, and knee line up. You may bend your bottom knee slightly to help you balance. 2. Roll your hips slightly forward, so your hips are stacked directly over each other and your left / right knee is facing forward. 3. Leading with your heel, lift your top leg 4-6 inches (10-15 cm). You should feel the muscles in your top hip lifting. ? Do not let your foot drift forward. ? Do not let your knee roll toward the ceiling. 4. Hold this position for __________ seconds. 5. Slowly return to the starting position. 6. Let your muscles relax completely between repetitions. Repeat __________ times. Complete this exercise __________ times a day. Straight leg raises, side-lying This exercise strengthens the muscles that move the hip joint toward the center of the  body (hip adductors). 1. Lie on your side with your left / right leg in the bottom position. Lie so your head, shoulder, hip, and knee line up. You may place your upper foot in front to help you balance. 2. Roll your hips slightly forward, so your hips are stacked directly over each other and your left / right knee is facing forward. 3. Tense the muscles in your inner thigh and lift your bottom leg 4-6 inches (10-15 cm). 4. Hold this position for __________ seconds. 5. Slowly return to the starting position. 6. Let your muscles relax completely between repetitions. Repeat __________ times. Complete this exercise __________ times a day. Straight leg raises, supine This exercise strengthens the muscles in the front of your thigh (quadriceps). 1. Lie on your back (supine position) with your left / right leg extended and your other knee bent. 2. Tense the muscles in the front of your left / right thigh. You should see your kneecap slide up or see increased dimpling just above your knee. 3. Keep these muscles tight as you raise your   leg 4-6 inches (10-15 cm) off the floor. Do not let your knee bend. 4. Hold this position for __________ seconds. 5. Keep these muscles tense as you lower your leg. 6. Relax the muscles slowly and completely between repetitions. Repeat __________ times. Complete this exercise __________ times a day. Hip abductors, standing This exercise strengthens the muscles that move the leg and hip joint away from the center of the body (hip abductors). 1. Tie one end of a rubber exercise band or tubing to a secure surface, such as a chair, table, or pole. 2. Loop the other end of the band or tubing around your left / right ankle. 3. Keeping your ankle with the band or tubing directly opposite the secured end, step away until there is tension in the tubing or band. Hold on to a chair, table, or pole as needed for balance. 4. Lift your left / right leg out to your side. While you do  this: ? Keep your back upright. ? Keep your shoulders over your hips. ? Keep your toes pointing forward. ? Make sure to use your hip muscles to slowly lift your leg. Do not tip your body or forcefully lift your leg. 5. Hold this position for __________ seconds. 6. Slowly return to the starting position. Repeat __________ times. Complete this exercise __________ times a day. Squats This exercise strengthens the muscles in the front of your thigh (quadriceps). 1. Stand in a door frame so your feet and knees are in line with the frame. You may place your hands on the frame for balance. 2. Slowly bend your knees and lower your hips like you are going to sit in a chair. ? Keep your lower legs in a straight-up-and-down position. ? Do not let your hips go lower than your knees. ? Do not bend your knees lower than told by your health care provider. ? If your hip pain increases, do not bend as low. 3. Hold this position for ___________ seconds. 4. Slowly push with your legs to return to standing. Do not use your hands to pull yourself to standing. Repeat __________ times. Complete this exercise __________ times a day. This information is not intended to replace advice given to you by your health care provider. Make sure you discuss any questions you have with your health care provider. Document Revised: 08/13/2018 Document Reviewed: 11/18/2017 Elsevier Patient Education  2020 Elsevier Inc.  

## 2019-05-07 ENCOUNTER — Other Ambulatory Visit: Payer: Self-pay

## 2019-05-07 ENCOUNTER — Encounter: Payer: PRIVATE HEALTH INSURANCE | Attending: Surgery | Admitting: Dietician

## 2019-05-07 VITALS — Ht 69.0 in | Wt 305.9 lb

## 2019-05-07 DIAGNOSIS — Z881 Allergy status to other antibiotic agents status: Secondary | ICD-10-CM | POA: Insufficient documentation

## 2019-05-07 DIAGNOSIS — G4733 Obstructive sleep apnea (adult) (pediatric): Secondary | ICD-10-CM | POA: Diagnosis not present

## 2019-05-07 DIAGNOSIS — Z8249 Family history of ischemic heart disease and other diseases of the circulatory system: Secondary | ICD-10-CM | POA: Insufficient documentation

## 2019-05-07 DIAGNOSIS — Z880 Allergy status to penicillin: Secondary | ICD-10-CM | POA: Insufficient documentation

## 2019-05-07 DIAGNOSIS — Z8261 Family history of arthritis: Secondary | ICD-10-CM | POA: Diagnosis not present

## 2019-05-07 DIAGNOSIS — Z713 Dietary counseling and surveillance: Secondary | ICD-10-CM | POA: Diagnosis not present

## 2019-05-07 DIAGNOSIS — Z86711 Personal history of pulmonary embolism: Secondary | ICD-10-CM | POA: Diagnosis not present

## 2019-05-07 DIAGNOSIS — Z6841 Body Mass Index (BMI) 40.0 and over, adult: Secondary | ICD-10-CM | POA: Diagnosis not present

## 2019-05-07 NOTE — Progress Notes (Signed)
Pre-Operative Nutrition Class:  Appt start time: 0900   End time:  1100.  Patient was seen on 05/07/19 for Pre-Operative Bariatric Surgery Education at [facility] at [facility].   Surgery date: TBD Surgery type: Sleeve Gastrectomy Start weight at [facility]: 302.4lbs Weight today: 305.9lbs  InBody  BODY COMP RESULTS patient declined body comp measurement; difficult to remove socks and shoes.   Samples given per MNT protocol. Patient educated on appropriate usage: Celebrate Vitamins Multivitamin  Lot # [id number],  Exp: 03/2020; Lot #: [id number], Exp: 04/2020; 0146A,Exp: 11/2019; Lot# [id number], Exp: 11/2019  Celebrate Vitamins Calcium Citrate   Lot # [id number], Exp: 09/2019; Lot # [id number], Exp: 06/2019; Lot# [id number], Exp: 07/2019; Lot# [id number], Exp: 08/2019; Lot# [id number], Exp: 0006, Exp: 07/2019; Lot# [id number], Exp: 07/2019; Lot# [id number], Exp: 11/2019  Celebrate Vitamins Iron   Lot# [id number], Exp: 06/2020; Lot# [id number], Exp: 06/2020   Renee Pain Protein Powder   Lot # [id number], Exp: 06/2019; Lot# [id number], Exp: 06/2019; Lot# [id number], Exp: 06/2019  Premier Protein Shake   Lot# [id number], Exp: 10/30/19 or Lot# [id number], Exp: 09/19/19   The following the learning objectives were met by the patient during this course:  Identify Pre-Op Dietary Goals and will begin 2 weeks pre-operatively  Identify appropriate sources of fluids and proteins   State protein recommendations and appropriate sources pre and post-operatively  Identify Post-Operative Dietary Goals and will follow for 2 weeks post-operatively  Identify appropriate multivitamin and calcium sources  Describe the need for physical activity post-operatively and will follow MD recommendations  State when to call healthcare provider regarding medication questions or post-operative complications  Handouts given during class include:  Pre-Op Bariatric Surgery Diet Handout  Protein Shake Handout  Post-Op Bariatric Surgery Nutrition Handout  BELT Program  Information Flyer  Support Group Information Flyer  [facility] Bariatric Supplements Price List  Follow-Up Plan: Patient will follow-up at [place], at about 2 weeks post operatively for diet advancement per MD.

## 2019-06-07 ENCOUNTER — Other Ambulatory Visit: Payer: Self-pay | Admitting: Cardiovascular Disease

## 2019-06-07 ENCOUNTER — Other Ambulatory Visit: Payer: Self-pay | Admitting: Internal Medicine

## 2019-06-07 DIAGNOSIS — I2699 Other pulmonary embolism without acute cor pulmonale: Secondary | ICD-10-CM

## 2019-06-07 DIAGNOSIS — I480 Paroxysmal atrial fibrillation: Secondary | ICD-10-CM

## 2019-06-07 DIAGNOSIS — I82492 Acute embolism and thrombosis of other specified deep vein of left lower extremity: Secondary | ICD-10-CM

## 2019-06-07 DIAGNOSIS — R0789 Other chest pain: Secondary | ICD-10-CM

## 2019-06-08 ENCOUNTER — Ambulatory Visit: Payer: No Typology Code available for payment source

## 2019-06-08 NOTE — Telephone Encounter (Signed)
Last filled 03/15/2019... please advise 

## 2019-06-09 ENCOUNTER — Ambulatory Visit: Payer: No Typology Code available for payment source

## 2019-06-10 ENCOUNTER — Telehealth: Payer: Self-pay

## 2019-06-10 NOTE — Telephone Encounter (Signed)
[facility] Night - Client Nonclinical Telephone Record AccessNurse Client [facility] Night - Client Client Site [place] - Night Contact Type Call Who Is Calling Patient / Member / Family / Caregiver Caller Name Antar Dollman Caller Phone Number [phone] Patient Name Richard [name] Patient DOB 08-Oct-1960 Call Type Message Only Information Provided Reason for Call Request to Reschedule Office Appointment Initial Comment caller states he missed his appt for his b12 shot and needs to reschedule Additional Comment message sent, office hours provided, declined triage Disp. Time Disposition Final User 06/10/2019 7:48:08 AM General Information Provided Yes Birdena Jubilee Call Closed By: Birdena Jubilee Transaction Date/Time: 06/10/2019 7:45:21 AM (ET)

## 2019-06-16 ENCOUNTER — Ambulatory Visit (INDEPENDENT_AMBULATORY_CARE_PROVIDER_SITE_OTHER): Payer: No Typology Code available for payment source

## 2019-06-16 DIAGNOSIS — E538 Deficiency of other specified B group vitamins: Secondary | ICD-10-CM

## 2019-06-16 MED ORDER — CYANOCOBALAMIN 1000 MCG/ML IJ SOLN
1000.0000 ug | Freq: Once | INTRAMUSCULAR | Status: AC
  Administered 2019-06-16: 1000 ug via INTRAMUSCULAR

## 2019-06-16 NOTE — Progress Notes (Signed)
Pt given 1071mcg/ml B12 injection in left deltoid. Tolerated well.

## 2019-07-13 ENCOUNTER — Other Ambulatory Visit: Payer: Self-pay

## 2019-07-13 ENCOUNTER — Ambulatory Visit: Payer: No Typology Code available for payment source

## 2019-07-13 ENCOUNTER — Ambulatory Visit (INDEPENDENT_AMBULATORY_CARE_PROVIDER_SITE_OTHER): Payer: No Typology Code available for payment source | Admitting: Cardiovascular Disease

## 2019-07-13 ENCOUNTER — Encounter: Payer: Self-pay | Admitting: Cardiovascular Disease

## 2019-07-13 VITALS — BP 120/70 | HR 57 | Ht 69.0 in | Wt 304.0 lb

## 2019-07-13 DIAGNOSIS — Z01810 Encounter for preprocedural cardiovascular examination: Secondary | ICD-10-CM | POA: Diagnosis not present

## 2019-07-13 DIAGNOSIS — I480 Paroxysmal atrial fibrillation: Secondary | ICD-10-CM | POA: Diagnosis not present

## 2019-07-13 NOTE — Patient Instructions (Signed)

## 2019-07-13 NOTE — Progress Notes (Signed)
Cardiology Office Note   Date:  07/13/2019   ID:  Richard [name], DOB 04/15/1960, MRN [id number]  PCP:  Jearld Fenton, NP  Cardiologist:   Kathlyn Sacramento, MD   Chief Complaint  Patient presents with  . OTHER    6 month f/u c/o chest discomfort when takes deep breath. Pt requested cardiac clearance. Pt mother buried his mother yesterday. Meds reviewed verbally with pt.      History of Present Illness: Richard [name] is a 59 y.o. male who presents for a follow-up visit regarding paroxysmal atrial fibrillation and preop cardiovascular evaluation for shoulder surgery.. The patient has history of normal cardiac catheterization in 2015 after an abnormal stress test, hypertension, obesity, osteoarthritis status post bilateral knee surgeries.   The patient had multiple knee surgeries starting in 2016. He developed left lower extremity DVT complicated by massive pulmonary embolism which was treated with catheter-based intervention and IVC filter placement.  He had atrial fibrillation at that time but converted to sinus rhythm. He had intermittent palpitations thought to be due to atrial fibrillation and has been maintaining in sinus rhythm with flecainide. Lifelong anticoagulation has been recommended.  He has been doing reasonably well with no recent chest pain or shortness of breath.  No palpitations.  Unfortunately, his mother died yesterday after a battle with cancer.  The patient is expected to have gastric sleeve surgery in the near future.  Past Medical History:  Diagnosis Date  . Arthritis   . Arthrofibrosis of total knee arthroplasty ([place]) 05/30/2015  . Bilateral pulmonary embolism ([name])    a. 03/2015 CTA: acute bilat PE; b. IVC filter placed in 2017--> removed 2018.  Marland Kitchen Chest pain    a. 2015 Abnl MV;  b. 2015 Cath: nl cors.  . Chronic diastolic CHF (congestive heart failure) ([place])    a. 03/2015 Echo: EF 55-65%, poor windows, mildly dil RV.  Marland Kitchen DVT (deep venous thrombosis)  ([place])    a. 03/2015 U/S: occlusive DVT w/in the distal aspect of the Left fem vein through the L popliteal vein.  Marland Kitchen GERD (gastroesophageal reflux disease)   . Headache   . Hypertensive heart disease   . Obesity   . OSA (obstructive sleep apnea)    uses CPAP nightly  . PAF (paroxysmal atrial fibrillation) ([place])   . Primary localized osteoarthritis of left knee    a. 11/2014 s/p L TKA.  . Primary localized osteoarthritis of right knee    a. 02/2014 s/p R Partial Knee arthroplasty.  . Stiffness of left knee    a. 12/2014 s/p manipulation under anesthesia.  . Urine incontinence     Past Surgical History:  Procedure Laterality Date  . APPENDECTOMY    . CARDIAC CATHETERIZATION    . CARPAL TUNNEL RELEASE Left 03/2018  . EXAM UNDER ANESTHESIA WITH MANIPULATION OF KNEE Left 05/30/2015   Procedure: EXAM UNDER ANESTHESIA WITH MANIPULATION OF LEFT KNEE;  Surgeon: Marchia Bond, MD;  Location: [place];  Service: Orthopedics;  Laterality: Left;  . I & D KNEE WITH POLY EXCHANGE Left 10/30/2015   Procedure: IRRIGATION AND DEBRIDEMENT KNEE WITH POLY EXCHANGE PLACE SPACERS;  Surgeon: Frederik Pear, MD;  Location: [place];  Service: Orthopedics;  Laterality: Left;  OFF ELIQUIST 3 DAYS  . IVC FILTER PLACEMENT ([facility] HX)  04/13/15  . IVC FILTER REMOVAL N/A 08/13/2016   Procedure: IVC Filter Removal;  Surgeon: Katha Cabal, MD;  Location: [place] [facility];  Service: Cardiovascular;  Laterality: N/A;  .  JOINT REPLACEMENT    . KNEE ARTHROSCOPY Left 08/08/2015   Procedure: LEFT KNEE MANIPULATION WITH ARTHROSCOPIC LYSIS OF ADHESIONS AND ASPIRATION;  Surgeon: Marchia Bond, MD;  Location: [place];  Service: Orthopedics;  Laterality: Left;  . KNEE CLOSED REDUCTION Left 01/20/2015   Procedure: LEFT KNEE MANIPULATION;  Surgeon: Marchia Bond, MD;  Location: [place];  Service: Orthopedics;  Laterality: Left;  . LEFT HEART CATHETERIZATION WITH CORONARY ANGIOGRAM N/A 01/12/2014   Procedure: LEFT HEART  CATHETERIZATION WITH CORONARY ANGIOGRAM;  Surgeon: Jettie Booze, MD;  Location: [facility];  Service: Cardiovascular;  Laterality: N/A;  . ORTHOPEDIC SURGERY Left    arthroscopy x3  . PARTIAL KNEE ARTHROPLASTY Right 02/25/2014   Procedure: RIGHT KNEE ARTHROPLASTY CONDYLE AND PLATEAU MEDIAL COMPARTMENT ;  Surgeon: Johnny Bridge, MD;  Location: [place];  Service: Orthopedics;  Laterality: Right;  . PERIPHERAL VASCULAR CATHETERIZATION N/A 03/29/2015   Procedure: IVC Filter Insertion, and possible thrombectomy;  Surgeon: Katha Cabal, MD;  Location: [place] [facility];  Service: Cardiovascular;  Laterality: N/A;  . ROTATOR CUFF REPAIR Left   . TOTAL KNEE ARTHROPLASTY  12/06/2014   Procedure: TOTAL KNEE ARTHROPLASTY;  Surgeon: Marchia Bond, MD;  Location: [place];  Service: Orthopedics;;  . TOTAL KNEE REVISION Left 02/08/2016   Procedure: TOTAL KNEE REVISION;  Surgeon: Frederik Pear, MD;  Location: [place];  Service: Orthopedics;  Laterality: Left;     Current Outpatient Medications  Medication Sig Dispense Refill  . acetaminophen (TYLENOL) 500 MG tablet Take 1,000 mg by mouth every 6 (six) hours as needed for moderate pain.    . baclofen (LIORESAL) 10 MG tablet TAKE 1 TO 2 TABLETS BY MOUTH 3 TIMES DAILY AS NEEDED FOR MUSCLE SPASMS 180 each 0  . ELIQUIS 5 MG TABS tablet TAKE 1 TABLET BY MOUTH 2 TIMES DAILY 60 tablet 6  . flecainide (TAMBOCOR) 100 MG tablet TAKE 1 TABLET BY MOUTH TWICE (2) DAILY 180 tablet 0  . furosemide (LASIX) 40 MG tablet TAKE 1 TABLET (40 MG TOTAL) BY MOUTH DAILY AS NEEDED. MUST SCHEDULE ANNUAL EXAM 30 tablet 0  . gabapentin (NEURONTIN) 100 MG capsule Take 1 capsule (100 mg total) by mouth daily as needed. (Patient taking differently: Take 100 mg by mouth daily as needed (for hand pain). ) 30 capsule 2  . HYDROcodone-acetaminophen (NORCO/VICODIN) 5-325 MG tablet Take 1 tablet by mouth every 6 (six) hours as needed for moderate pain. (Patient not taking:  Reported on 05/04/2019) 15 tablet 0  . metoprolol succinate (TOPROL-XL) 25 MG 24 hr tablet TAKE 1 TABLET BY MOUTH DAILY WITH OR IMMEDIATELY FOLLOWING A MEAL 90 tablet 3  . Sennosides-Docusate Sodium (SM STOOL SOFTENER/LAXATIVE PO) Take 1 tablet by mouth at bedtime.    . traMADol (ULTRAM) 50 MG tablet TAKE 2 TABLETS BY MOUTH AT BEDTIME AS NEEDED 60 tablet 0   Current Facility-Administered Medications  Medication Dose Route Frequency Provider Last Rate Last Admin  . cyanocobalamin ((VITAMIN B-12)) injection 1,000 mcg  1,000 mcg Intramuscular Once Baity, Regina W, NP        Allergies:   Penicillin g, Penicillins, and Vancomycin    Social History:  The patient  reports that he has never smoked. He has never used smokeless tobacco. He reports previous alcohol use. He reports that he does not use drugs.   Family History:  The patient's family history includes Alzheimer's disease in his paternal grandfather; Arthritis in his mother; Coronary artery disease in  his father and mother; Heart disease in his paternal grandmother; Hyperlipidemia in his mother; Hypertension in his mother.    ROS:  Please see the history of present illness.   Otherwise, review of systems are positive for none.   All other systems are reviewed and negative.    PHYSICAL EXAM: VS:  BP 120/70 (BP Location: Left Arm, Patient Position: Sitting, Cuff Size: Large)   Pulse (!) 57   Ht 5\' 9"  (1.753 m)   Wt (!) 304 lb (137.9 kg)   SpO2 98%   BMI 44.89 kg/m  , BMI Body mass index is 44.89 kg/m. GEN: Well nourished, well developed, in no acute distress  HEENT: normal  Neck: no JVD, carotid bruits, or masses Cardiac: RRR; no murmurs, rubs, or gallops,no edema  Respiratory:  clear to auscultation bilaterally, normal work of breathing GI: soft, nontender, nondistended, + BS MS: no deformity or atrophy  Skin: warm and dry, no rash Neuro:  Strength and sensation are intact Psych: euthymic mood, full affect   EKG:  EKG is  ordered today. The ekg ordered today demonstrates sinus bradycardia with left anterior fascicular block.  Heart rate is 57 bpm.  Recent Labs: No results found for requested labs within last 8760 hours.    Lipid Panel    Component Value Date/Time   CHOL 164 06/23/2018 1545   CHOL 170 10/31/2017 1438   TRIG 91.0 06/23/2018 1545   HDL 40.20 06/23/2018 1545   HDL 36 (L) 10/31/2017 1438   CHOLHDL 4 06/23/2018 1545   VLDL 18.2 06/23/2018 1545   LDLCALC 106 (H) 06/23/2018 1545   LDLCALC 108 (H) 10/31/2017 1438      Wt Readings from Last 3 Encounters:  07/13/19 (!) 304 lb (137.9 kg)  05/07/19 (!) 305 lb 14.4 oz (138.8 kg)  05/04/19 (!) 308 lb (139.7 kg)       ASSESSMENT AND PLAN:  1.  Paroxysmal atrial fibrillation:  No evidence of recurrent arrhythmia on flecainide. He is tolerating anticoagulation with Eliquis.  He is mildly bradycardic but is asymptomatic.  If bradycardia worsens, we can stop Toprol.  2. Pulmonary embolism: Given the burden of his pulmonary embolism, recommend continuing anticoagulation.   3. Obesity: The patient is in the process of evaluation for gastric sleeve surgery.  4.  Preop cardiovascular evaluation: The patient has no anginal symptoms and previous cardiac catheterization showed no coronary artery disease.  He has been stable from a cardiac standpoint and does not require cardiac testing before surgery.  Eliquis can be held 2 days before surgery and should be resumed postoperatively as soon as safe from a bleeding standpoint.     Disposition:   FU with me in 6 months  Signed,  Kathlyn Sacramento, MD  07/13/2019 4:43 PM    [place]

## 2019-07-16 ENCOUNTER — Other Ambulatory Visit: Payer: Self-pay | Admitting: Internal Medicine

## 2019-07-17 NOTE — Telephone Encounter (Signed)
Last filled 03/15/2019... please advise

## 2019-07-17 NOTE — Telephone Encounter (Signed)
OK to refill. Can you phone in, I am unable to e-prescribe currently.

## 2019-07-27 ENCOUNTER — Other Ambulatory Visit: Payer: Self-pay

## 2019-07-27 ENCOUNTER — Ambulatory Visit (INDEPENDENT_AMBULATORY_CARE_PROVIDER_SITE_OTHER): Payer: No Typology Code available for payment source

## 2019-07-27 ENCOUNTER — Other Ambulatory Visit (INDEPENDENT_AMBULATORY_CARE_PROVIDER_SITE_OTHER): Payer: No Typology Code available for payment source

## 2019-07-27 DIAGNOSIS — E538 Deficiency of other specified B group vitamins: Secondary | ICD-10-CM

## 2019-07-27 MED ORDER — CYANOCOBALAMIN 1000 MCG/ML IJ SOLN
1000.0000 ug | Freq: Once | INTRAMUSCULAR | Status: AC
  Administered 2019-07-27: 1000 ug via INTRAMUSCULAR

## 2019-07-27 NOTE — Progress Notes (Signed)
Pt had B12 Lab prior to injection.  Injection of B12 1058mcg/ml given in Right Deltoid. Pt tolerated well.

## 2019-07-28 LAB — VITAMIN B12: Vitamin B-12: 289 pg/mL (ref 211–911)

## 2019-08-04 ENCOUNTER — Telehealth: Payer: Self-pay | Admitting: *Deleted

## 2019-08-04 NOTE — Telephone Encounter (Signed)
5000 mcgs is a lot. He could take this 2 x week, but not daily.

## 2019-08-04 NOTE — Telephone Encounter (Signed)
Patient left a voicemail stating that he was advised to take B-12 pills and wants to confirm the dose.  Called patient back and advised him of the recommendation that Webb Silversmith NP made on the lab report.  Patient stated that the pharmacist at [facility] gave him 5000 mcg and wants to if it is okay for him to take what he got at [facility].

## 2019-08-06 NOTE — Telephone Encounter (Signed)
Left message on voicemail.

## 2019-08-11 ENCOUNTER — Ambulatory Visit: Payer: No Typology Code available for payment source | Admitting: Internal Medicine

## 2019-08-18 DIAGNOSIS — Z20822 Contact with and (suspected) exposure to covid-19: Secondary | ICD-10-CM | POA: Diagnosis not present

## 2019-08-22 DIAGNOSIS — Z20822 Contact with and (suspected) exposure to covid-19: Secondary | ICD-10-CM | POA: Diagnosis not present

## 2019-08-26 ENCOUNTER — Telehealth: Payer: Self-pay | Admitting: Skilled Nursing Facility1

## 2019-08-26 NOTE — Telephone Encounter (Signed)
Dietitian called pt to assess their understanding of the pre-op nutrition recommendations through the teach back method to ensure the pts knowledge readiness in preparation for surgery.    Pt had some questions which were answered to his satisfaction.  Pt states his mother passed away last month and his wife and son have covid currently and his father has cancer. Pt states he still wants to continue on with surgery.

## 2019-08-30 ENCOUNTER — Other Ambulatory Visit: Payer: Self-pay | Admitting: Cardiovascular Disease

## 2019-08-30 DIAGNOSIS — R0789 Other chest pain: Secondary | ICD-10-CM

## 2019-08-30 DIAGNOSIS — I480 Paroxysmal atrial fibrillation: Secondary | ICD-10-CM

## 2019-08-30 DIAGNOSIS — I82492 Acute embolism and thrombosis of other specified deep vein of left lower extremity: Secondary | ICD-10-CM

## 2019-08-30 DIAGNOSIS — I2699 Other pulmonary embolism without acute cor pulmonale: Secondary | ICD-10-CM

## 2019-08-31 ENCOUNTER — Other Ambulatory Visit: Payer: Self-pay

## 2019-08-31 ENCOUNTER — Ambulatory Visit (INDEPENDENT_AMBULATORY_CARE_PROVIDER_SITE_OTHER): Payer: PRIVATE HEALTH INSURANCE | Admitting: *Deleted

## 2019-08-31 DIAGNOSIS — E538 Deficiency of other specified B group vitamins: Secondary | ICD-10-CM

## 2019-08-31 MED ORDER — CYANOCOBALAMIN 1000 MCG/ML IJ SOLN
1000.0000 ug | Freq: Once | INTRAMUSCULAR | Status: AC
  Administered 2019-08-31: 1000 ug via INTRAMUSCULAR

## 2019-08-31 NOTE — Progress Notes (Signed)
Per orders of [facility] injection of Vitamin B12 given by Lauralyn Primes. Patient tolerated injection well.

## 2019-09-02 ENCOUNTER — Telehealth: Payer: Self-pay | Admitting: Physician Assistant

## 2019-09-02 ENCOUNTER — Telehealth: Payer: Self-pay | Admitting: Internal Medicine

## 2019-09-02 NOTE — Telephone Encounter (Signed)
Called to discuss with patient about Covid symptoms and the use of bamlanivimab/etesevimab or casirivimab/imdevimab, a monoclonal antibody infusion for those with mild to moderate Covid symptoms and at a high risk of hospitalization.  Pt is qualified for this infusion at the [facility] infusion center due to BMI>35, HTN, AFib, CHF  Message left to call back our hotline [phone].  Angelena Form PA-C  [facility]

## 2019-09-02 NOTE — Telephone Encounter (Signed)
Pt called stating he was here for b12 8/10 this injection was done in the car out front because he had covid symptoms. He family spouse son had covid.    He stated he has had 4 covid test done this month and the one he took this week was positive.    He is having headache stuffy running nose diarrhea and tiredness.  He was asking about infusion  Does he needs this?  His spouse and son had infusion?

## 2019-09-02 NOTE — Telephone Encounter (Signed)
Appointment 8/13 with Damita Dunnings

## 2019-09-02 NOTE — Telephone Encounter (Signed)
He needs to schedule a virtual to discuss

## 2019-09-03 ENCOUNTER — Other Ambulatory Visit: Payer: Self-pay | Admitting: Nurse Practitioner

## 2019-09-03 ENCOUNTER — Telehealth: Payer: No Typology Code available for payment source | Admitting: Family Medicine

## 2019-09-03 ENCOUNTER — Other Ambulatory Visit: Payer: Self-pay

## 2019-09-03 ENCOUNTER — Ambulatory Visit (HOSPITAL_COMMUNITY)
Admission: RE | Admit: 2019-09-03 | Discharge: 2019-09-03 | Disposition: A | Discharge status: Home / Self Care | Payer: PRIVATE HEALTH INSURANCE | Source: Ambulatory Visit | Attending: Pulmonary Disease | Admitting: Pulmonary Disease

## 2019-09-03 DIAGNOSIS — Z23 Encounter for immunization: Secondary | ICD-10-CM | POA: Insufficient documentation

## 2019-09-03 DIAGNOSIS — I10 Essential (primary) hypertension: Secondary | ICD-10-CM | POA: Insufficient documentation

## 2019-09-03 DIAGNOSIS — U071 COVID-19: Secondary | ICD-10-CM

## 2019-09-03 MED ORDER — FAMOTIDINE IN NACL 20-0.9 MG/50ML-% IV SOLN: 20.0000 mg | Freq: Once | INTRAVENOUS | Status: DC | PRN

## 2019-09-03 MED ORDER — METHYLPREDNISOLONE SODIUM SUCC 125 MG IJ SOLR: 125.0000 mg | Freq: Once | INTRAMUSCULAR | Status: DC | PRN

## 2019-09-03 MED ORDER — SODIUM CHLORIDE 0.9 % IV SOLN
1200.0000 mg | Freq: Once | INTRAVENOUS | Status: AC
  Administered 2019-09-03: 1200 mg via INTRAVENOUS
  Filled 2019-09-03: qty 1200

## 2019-09-03 MED ORDER — EPINEPHRINE 0.3 MG/0.3ML IJ SOAJ: 0.3000 mg | Freq: Once | INTRAMUSCULAR | Status: DC | PRN

## 2019-09-03 MED ORDER — SODIUM CHLORIDE 0.9 % IV SOLN: INTRAVENOUS | Status: DC | PRN

## 2019-09-03 MED ORDER — DIPHENHYDRAMINE HCL 50 MG/ML IJ SOLN: 50.0000 mg | Freq: Once | INTRAMUSCULAR | Status: DC | PRN

## 2019-09-03 MED ORDER — ALBUTEROL SULFATE HFA 108 (90 BASE) MCG/ACT IN AERS: 2.0000 | INHALATION_SPRAY | Freq: Once | RESPIRATORY_TRACT | Status: DC | PRN

## 2019-09-03 NOTE — Progress Notes (Signed)
I connected by phone with Richard [name] on 09/03/2019 at 8:46 AM to discuss the potential use of a new treatment for mild to moderate COVID-19 viral infection in non-hospitalized patients.  This patient is a 59 y.o. male that meets the FDA criteria for Emergency Use Authorization of COVID monoclonal antibody casirivimab/imdevimab.  Has a (+) direct SARS-CoV-2 viral test result  Has mild or moderate COVID-19   Is NOT hospitalized due to COVID-19  Is within 10 days of symptom onset  Has at least one of the high risk factor(s) for progression to severe COVID-19 and/or hospitalization as defined in EUA.  Specific high risk criteria : BMI > 25 and Cardiovascular disease or hypertension   I have spoken and communicated the following to the patient or parent/caregiver regarding COVID monoclonal antibody treatment:  1. FDA has authorized the emergency use for the treatment of mild to moderate COVID-19 in adults and pediatric patients with positive results of direct SARS-CoV-2 viral testing who are 26 years of age and older weighing at least 40 kg, and who are at high risk for progressing to severe COVID-19 and/or hospitalization.  2. The significant known and potential risks and benefits of COVID monoclonal antibody, and the extent to which such potential risks and benefits are unknown.  3. Information on available alternative treatments and the risks and benefits of those alternatives, including clinical trials.  4. Patients treated with COVID monoclonal antibody should continue to self-isolate and use infection control measures (e.g., wear mask, isolate, social distance, avoid sharing personal items, clean and disinfect "high touch" surfaces, and frequent handwashing) according to CDC guidelines.   5. The patient or parent/caregiver has the option to accept or refuse COVID monoclonal antibody treatment.  After reviewing this information with the patient, The patient agreed to proceed with  receiving casirivimab\imdevimab infusion and will be provided a copy of the Fact sheet prior to receiving the infusion. Richard [name] 09/03/2019 8:46 AM

## 2019-09-03 NOTE — Progress Notes (Signed)
  Diagnosis: COVID-19  Physician: Dr. Joya Gaskins  Procedure: Covid Infusion Clinic Med: casirivimab\imdevimab infusion - Provided patient with casirivimab\imdevimab fact sheet for patients, parents and caregivers prior to infusion.  Complications: No immediate complications noted.  Discharge: Discharged home   [place] 09/03/2019

## 2019-09-03 NOTE — Discharge Instructions (Signed)

## 2019-09-08 ENCOUNTER — Other Ambulatory Visit: Payer: Self-pay | Admitting: Internal Medicine

## 2019-09-16 ENCOUNTER — Encounter: Payer: Self-pay | Admitting: Internal Medicine

## 2019-09-16 ENCOUNTER — Other Ambulatory Visit: Payer: Self-pay

## 2019-09-16 ENCOUNTER — Ambulatory Visit (INDEPENDENT_AMBULATORY_CARE_PROVIDER_SITE_OTHER): Payer: PRIVATE HEALTH INSURANCE | Admitting: Internal Medicine

## 2019-09-16 VITALS — BP 138/80 | HR 52 | Ht 69.0 in | Wt 302.0 lb

## 2019-09-16 DIAGNOSIS — R350 Frequency of micturition: Secondary | ICD-10-CM | POA: Diagnosis not present

## 2019-09-16 DIAGNOSIS — R1031 Right lower quadrant pain: Secondary | ICD-10-CM | POA: Diagnosis not present

## 2019-09-16 LAB — POCT URINALYSIS DIPSTICK
Bilirubin, UA: NEGATIVE
Blood, UA: NEGATIVE
Glucose, UA: NEGATIVE
Ketones, UA: NEGATIVE
Leukocytes, UA: NEGATIVE
Nitrite, UA: NEGATIVE
Protein, UA: NEGATIVE
Spec Grav, UA: 1.025 (ref 1.010–1.025)
Urobilinogen, UA: 0.2 E.U./dL
pH, UA: 6 (ref 5.0–8.0)

## 2019-09-16 MED ORDER — METHYLPREDNISOLONE ACETATE 80 MG/ML IJ SUSP
80.0000 mg | Freq: Once | INTRAMUSCULAR | Status: AC
  Administered 2019-09-16: 80 mg via INTRAMUSCULAR

## 2019-09-16 NOTE — Patient Instructions (Signed)
Hip Exercises Ask your health care provider which exercises are safe for you. Do exercises exactly as told by your health care provider and adjust them as directed. It is normal to feel mild stretching, pulling, tightness, or discomfort as you do these exercises. Stop right away if you feel sudden pain or your pain gets worse. Do not begin these exercises until told by your health care provider. Stretching and range-of-motion exercises These exercises warm up your muscles and joints and improve the movement and flexibility of your hip. These exercises also help to relieve pain, numbness, and tingling. You may be asked to limit your range of motion if you had a hip replacement. Talk to your health care provider about these restrictions. Hamstrings, supine  1. Lie on your back (supine position). 2. Loop a belt or towel over the ball of your left / right foot. The ball of your foot is on the walking surface, right under your toes. 3. Straighten your left / right knee and slowly pull on the belt or towel to raise your leg until you feel a gentle stretch behind your knee (hamstring). ? Do not let your knee bend while you do this. ? Keep your other leg flat on the floor. 4. Hold this position for __________ seconds. 5. Slowly return your leg to the starting position. Repeat __________ times. Complete this exercise __________ times a day. Hip rotation  1. Lie on your back on a firm surface. 2. With your left / right hand, gently pull your left / right knee toward the shoulder that is on the same side of the body. Stop when your knee is pointing toward the ceiling. 3. Hold your left / right ankle with your other hand. 4. Keeping your knee steady, gently pull your left / right ankle toward your other shoulder until you feel a stretch in your buttocks. ? Keep your hips and shoulders firmly planted while you do this stretch. 5. Hold this position for __________ seconds. Repeat __________ times. Complete  this exercise __________ times a day. Seated stretch This exercise is sometimes called hamstrings and adductors stretch. 1. Sit on the floor with your legs stretched wide. Keep your knees straight during this exercise. 2. Keeping your head and back in a straight line, bend at your waist to reach for your left foot (position A). You should feel a stretch in your right inner thigh (adductors). 3. Hold this position for __________ seconds. Then slowly return to the upright position. 4. Keeping your head and back in a straight line, bend at your waist to reach forward (position B). You should feel a stretch behind both of your thighs and knees (hamstrings). 5. Hold this position for __________ seconds. Then slowly return to the upright position. 6. Keeping your head and back in a straight line, bend at your waist to reach for your right foot (position C). You should feel a stretch in your left inner thigh (adductors). 7. Hold this position for __________ seconds. Then slowly return to the upright position. Repeat __________ times. Complete this exercise __________ times a day. Lunge This exercise stretches the muscles of the hip (hip flexors). 1. Place your left / right knee on the floor and bend your other knee so that is directly over your ankle. You should be half-kneeling. 2. Keep good posture with your head over your shoulders. 3. Tighten your buttocks to point your tailbone downward. This will prevent your back from arching too much. 4. You should feel a   gentle stretch in the front of your left / right thigh and hip. If you do not feel a stretch, slide your other foot forward slightly and then slowly lunge forward with your chest up until your knee once again lines up over your ankle. ? Make sure your tailbone continues to point downward. 5. Hold this position for __________ seconds. 6. Slowly return to the starting position. Repeat __________ times. Complete this exercise __________ times a  day. Strengthening exercises These exercises build strength and endurance in your hip. Endurance is the ability to use your muscles for a long time, even after they get tired. Bridge This exercise strengthens the muscles of your hip (hip extensors). 1. Lie on your back on a firm surface with your knees bent and your feet flat on the floor. 2. Tighten your buttocks muscles and lift your bottom off the floor until the trunk of your body and your hips are level with your thighs. ? Do not arch your back. ? You should feel the muscles working in your buttocks and the back of your thighs. If you do not feel these muscles, slide your feet 1-2 inches (2.5-5 cm) farther away from your buttocks. 3. Hold this position for __________ seconds. 4. Slowly lower your hips to the starting position. 5. Let your muscles relax completely between repetitions. Repeat __________ times. Complete this exercise __________ times a day. Straight leg raises, side-lying This exercise strengthens the muscles that move the hip joint away from the center of the body (hip abductors). 1. Lie on your side with your left / right leg in the top position. Lie so your head, shoulder, hip, and knee line up. You may bend your bottom knee slightly to help you balance. 2. Roll your hips slightly forward, so your hips are stacked directly over each other and your left / right knee is facing forward. 3. Leading with your heel, lift your top leg 4-6 inches (10-15 cm). You should feel the muscles in your top hip lifting. ? Do not let your foot drift forward. ? Do not let your knee roll toward the ceiling. 4. Hold this position for __________ seconds. 5. Slowly return to the starting position. 6. Let your muscles relax completely between repetitions. Repeat __________ times. Complete this exercise __________ times a day. Straight leg raises, side-lying This exercise strengthens the muscles that move the hip joint toward the center of the  body (hip adductors). 1. Lie on your side with your left / right leg in the bottom position. Lie so your head, shoulder, hip, and knee line up. You may place your upper foot in front to help you balance. 2. Roll your hips slightly forward, so your hips are stacked directly over each other and your left / right knee is facing forward. 3. Tense the muscles in your inner thigh and lift your bottom leg 4-6 inches (10-15 cm). 4. Hold this position for __________ seconds. 5. Slowly return to the starting position. 6. Let your muscles relax completely between repetitions. Repeat __________ times. Complete this exercise __________ times a day. Straight leg raises, supine This exercise strengthens the muscles in the front of your thigh (quadriceps). 1. Lie on your back (supine position) with your left / right leg extended and your other knee bent. 2. Tense the muscles in the front of your left / right thigh. You should see your kneecap slide up or see increased dimpling just above your knee. 3. Keep these muscles tight as you raise your   leg 4-6 inches (10-15 cm) off the floor. Do not let your knee bend. 4. Hold this position for __________ seconds. 5. Keep these muscles tense as you lower your leg. 6. Relax the muscles slowly and completely between repetitions. Repeat __________ times. Complete this exercise __________ times a day. Hip abductors, standing This exercise strengthens the muscles that move the leg and hip joint away from the center of the body (hip abductors). 1. Tie one end of a rubber exercise band or tubing to a secure surface, such as a chair, table, or pole. 2. Loop the other end of the band or tubing around your left / right ankle. 3. Keeping your ankle with the band or tubing directly opposite the secured end, step away until there is tension in the tubing or band. Hold on to a chair, table, or pole as needed for balance. 4. Lift your left / right leg out to your side. While you do  this: ? Keep your back upright. ? Keep your shoulders over your hips. ? Keep your toes pointing forward. ? Make sure to use your hip muscles to slowly lift your leg. Do not tip your body or forcefully lift your leg. 5. Hold this position for __________ seconds. 6. Slowly return to the starting position. Repeat __________ times. Complete this exercise __________ times a day. Squats This exercise strengthens the muscles in the front of your thigh (quadriceps). 1. Stand in a door frame so your feet and knees are in line with the frame. You may place your hands on the frame for balance. 2. Slowly bend your knees and lower your hips like you are going to sit in a chair. ? Keep your lower legs in a straight-up-and-down position. ? Do not let your hips go lower than your knees. ? Do not bend your knees lower than told by your health care provider. ? If your hip pain increases, do not bend as low. 3. Hold this position for ___________ seconds. 4. Slowly push with your legs to return to standing. Do not use your hands to pull yourself to standing. Repeat __________ times. Complete this exercise __________ times a day. This information is not intended to replace advice given to you by your health care provider. Make sure you discuss any questions you have with your health care provider. Document Revised: 08/13/2018 Document Reviewed: 11/18/2017 Elsevier Patient Education  2020 Elsevier Inc.  

## 2019-09-16 NOTE — Progress Notes (Addendum)
Subjective:    Patient ID: Richard [name], male    DOB: 11/29/1960, 59 y.o.   MRN: [id number]  HPI  Pt presents to the clinic today with c/o bilateral (R>L) groin pain and urinary frequency. He reports this started about 4-5 days ago. He describes the pain as soreness, worse with applying pressure and worse with certain movements. The pain does not radiate. He denies new back pain, numbness, tingling or weakness of his lower extremities. He denies loss of bowel or bladder control but has noticed some urinary frequency that has improved since yesterday. He denies urgency, dysuria or blood in his urine. He denies fever, chills or body aches. He denies any recent injury but did fall about 4 months ago, xray of his right hip at that time was negative for acute findings. He has taken his prescription meds but has not taken anything additional OTC meds.  Review of Systems      Past Medical History:  Diagnosis Date  . Arthritis   . Arthrofibrosis of total knee arthroplasty ([place]) 05/30/2015  . Bilateral pulmonary embolism ([place])    a. 03/2015 CTA: acute bilat PE; b. IVC filter placed in 2017--> removed 2018.  Marland Kitchen Chest pain    a. 2015 Abnl MV;  b. 2015 Cath: nl cors.  . Chronic diastolic CHF (congestive heart failure) ([place])    a. 03/2015 Echo: EF 55-65%, poor windows, mildly dil RV.  Marland Kitchen DVT (deep venous thrombosis) ([place])    a. 03/2015 U/S: occlusive DVT w/in the distal aspect of the Left fem vein through the L popliteal vein.  Marland Kitchen GERD (gastroesophageal reflux disease)   . Headache   . Hypertensive heart disease   . Obesity   . OSA (obstructive sleep apnea)    uses CPAP nightly  . PAF (paroxysmal atrial fibrillation) ([place])   . Primary localized osteoarthritis of left knee    a. 11/2014 s/p L TKA.  . Primary localized osteoarthritis of right knee    a. 02/2014 s/p R Partial Knee arthroplasty.  . Stiffness of left knee    a. 12/2014 s/p manipulation under anesthesia.  . Urine incontinence      Current Outpatient Medications  Medication Sig Dispense Refill  . acetaminophen (TYLENOL) 500 MG tablet Take 1,000 mg by mouth every 6 (six) hours as needed for moderate pain.    . baclofen (LIORESAL) 10 MG tablet TAKE 1 TO 2 TABLETS BY MOUTH 3 TIMES DAILY AS NEEDED FOR MUSCLE SPASMS 180 each 0  . cyanocobalamin (,VITAMIN B-12,) 1000 MCG/ML injection Inject 1,000 mcg into the muscle every 30 (thirty) days.    Marland Kitchen ELIQUIS 5 MG TABS tablet TAKE 1 TABLET BY MOUTH 2 TIMES DAILY (Patient taking differently: Take 5 mg by mouth 2 (two) times daily. ) 60 tablet 6  . flecainide (TAMBOCOR) 100 MG tablet TAKE 1 TABLET BY MOUTH TWICE (2) DAILY (Patient taking differently: Take 100 mg by mouth 2 (two) times daily. ) 180 tablet 3  . furosemide (LASIX) 40 MG tablet TAKE 1 TABLET (40 MG TOTAL) BY MOUTH DAILY AS NEEDED. MUST SCHEDULE ANNUAL EXAM (Patient taking differently: Take 40 mg by mouth daily as needed (legs swelling). ) 30 tablet 0  . HYDROcodone-acetaminophen (NORCO/VICODIN) 5-325 MG tablet Take 1 tablet by mouth every 6 (six) hours as needed for moderate pain. 15 tablet 0  . metoprolol succinate (TOPROL-XL) 25 MG 24 hr tablet TAKE 1 TABLET BY MOUTH ONCE A DAY WITH OR IMMEDIATELY FOLLOWING A MEAL (Patient taking  differently: Take 25 mg by mouth daily. ) 90 tablet 3  . Sennosides-Docusate Sodium (SM STOOL SOFTENER/LAXATIVE PO) Take 1 tablet by mouth daily as needed (constipation.).     Marland Kitchen traMADol (ULTRAM) 50 MG tablet TAKE 2 TABLETS BY MOUTH AT BEDTIME AS NEEDED (Patient taking differently: Take 50-100 mg by mouth daily as needed (pain.). ) 60 tablet 0  . vitamin B-12 (CYANOCOBALAMIN) 500 MCG tablet Take 500 mcg by mouth daily.     Current Facility-Administered Medications  Medication Dose Route Frequency Provider Last Rate Last Admin  . cyanocobalamin ((VITAMIN B-12)) injection 1,000 mcg  1,000 mcg Intramuscular Once Jearld Fenton, NP        Allergies  Allergen Reactions  . Penicillin G Other (See  Comments)    From childhood; uncertain of reaction Has patient had a PCN reaction causing immediate rash, facial/tongue/throat swelling, SOB or lightheadedness with hypotension: Unk Has patient had a PCN reaction causing severe rash involving mucus membranes or skin necrosis: Unk Has patient had a PCN reaction that required hospitalization: Unk Has patient had a PCN reaction occurring within the last 10 years: No If all of the above answers are "NO", then may proceed with Cephalosporin use.  Marland Kitchen Penicillins Anaphylaxis and Other (See Comments)    From childhood; uncertain of reaction Has patient had a PCN reaction causing immediate rash, facial/tongue/throat swelling, SOB or lightheadedness with hypotension: Unk Has patient had a PCN reaction causing severe rash involving mucus membranes or skin necrosis: Unk Has patient had a PCN reaction that required hospitalization: Unk Has patient had a PCN reaction occurring within the last 10 years: No If all of the above answers are "NO", then may proceed with Cephalosporin use.  . Vancomycin Other (See Comments)    Red Mans' syndrome    Family History  Problem Relation Age of Onset  . Coronary artery disease Mother   . Hyperlipidemia Mother   . Hypertension Mother   . Arthritis Mother   . Coronary artery disease Father   . Heart disease Paternal Grandmother   . Alzheimer's disease Paternal Grandfather     Social History   Socioeconomic History  . Marital status: Married    Spouse name: Not on file  . Number of children: Not on file  . Years of education: Not on file  . Highest education level: Not on file  Occupational History  . Occupation: Energy manager: [employer]  Tobacco Use  . Smoking status: Never Smoker  . Smokeless tobacco: Never Used  Vaping Use  . Vaping Use: Never used  Substance and Sexual Activity  . Alcohol use: Not Currently    Comment: occassional  . Drug use: No  . Sexual activity: Yes  Other Topics  Concern  . Not on file  Social History Narrative  . Not on file   Social Determinants of Health   Financial Resource Strain:   . Difficulty of Paying Living Expenses: Not on file  Food Insecurity:   . Worried About Charity fundraiser in the Last Year: Not on file  . Ran Out of Food in the Last Year: Not on file  Transportation Needs:   . Lack of Transportation (Medical): Not on file  . Lack of Transportation (Non-Medical): Not on file  Physical Activity:   . Days of Exercise per Week: Not on file  . Minutes of Exercise per Session: Not on file  Stress:   . Feeling of Stress : Not on file  Social Connections:   . Frequency of Communication with Friends and Family: Not on file  . Frequency of Social Gatherings with Friends and Family: Not on file  . Attends Religious Services: Not on file  . Active Member of Clubs or Organizations: Not on file  . Attends Archivist Meetings: Not on file  . Marital Status: Not on file  Intimate Partner Violence:   . Fear of Current or Ex-Partner: Not on file  . Emotionally Abused: Not on file  . Physically Abused: Not on file  . Sexually Abused: Not on file     Constitutional: Denies fever, malaise, fatigue, headache or abrupt weight changes.  Respiratory: Denies difficulty breathing, shortness of breath, cough or sputum production.   Cardiovascular: Denies chest pain, chest tightness, palpitations or swelling in the hands or feet.  Gastrointestinal: Denies abdominal pain, bloating, constipation, diarrhea or blood in the stool.  GU: Pt reports urinary frequency. Denies urgency, pain with urination, burning sensation, blood in urine, odor or discharge. Musculoskeletal: Pt reports bilateral groin pain. Denies decrease in range of motion, difficulty with gait, or joint pain and swelling.  Skin: Denies redness, rashes, lesions or ulcercations.  Neurological: Denies numbness, tingling, weakness or problems with balance and coordination.      No other specific complaints in a complete review of systems (except as listed in HPI above).  Objective:   Physical Exam   BP 138/80   Pulse (!) 52   Ht 5\' 9"  (1.753 m)   Wt (!) 302 lb (137 kg)   SpO2 97%   BMI 44.60 kg/m  Wt Readings from Last 3 Encounters:  09/16/19 (!) 302 lb (137 kg)  07/13/19 (!) 304 lb (137.9 kg)  05/07/19 (!) 305 lb 14.4 oz (138.8 kg)    General: Appears his stated age, obese, in NAD. Skin: Warm, dry and intact. No rashes noted. Cardiovascular: Bradycardic with normal rhythm.  Pulmonary/Chest: Normal effort and positive vesicular breath sounds.  Groin: No lymphadenopathy. Pain with palpation over the mid groin. No inguinal hernia noted. Musculoskeletal: Normal flexion, extension, abduction and adduction of the right hip. Pain with internal and external rotation of the right hip. No pain with palpation of the hip. Strength 5/5 BLE. No difficulty with gait.  Neurological: Alert and oriented.    BMET    Component Value Date/Time   NA 139 06/23/2018 1545   NA 141 10/31/2017 1438   K 4.7 06/23/2018 1545   CL 103 06/23/2018 1545   CO2 31 06/23/2018 1545   GLUCOSE 80 06/23/2018 1545   BUN 18 06/23/2018 1545   BUN 18 10/31/2017 1438   CREATININE 1.27 06/23/2018 1545   CREATININE 1.27 12/06/2015 1600   CALCIUM 9.2 06/23/2018 1545   GFRNONAA 54 (L) 06/09/2018 2115   GFRAA >60 06/09/2018 2115    Lipid Panel     Component Value Date/Time   CHOL 164 06/23/2018 1545   CHOL 170 10/31/2017 1438   TRIG 91.0 06/23/2018 1545   HDL 40.20 06/23/2018 1545   HDL 36 (L) 10/31/2017 1438   CHOLHDL 4 06/23/2018 1545   VLDL 18.2 06/23/2018 1545   LDLCALC 106 (H) 06/23/2018 1545   LDLCALC 108 (H) 10/31/2017 1438    CBC    Component Value Date/Time   WBC 9.5 06/23/2018 1545   RBC 5.23 06/23/2018 1545   HGB 15.1 06/23/2018 1545   HGB 14.0 10/31/2017 1438   HCT 46.0 06/23/2018 1545   HCT 43.5 10/31/2017 1438   PLT 195.0  06/23/2018 1545   PLT 220  10/31/2017 1438   MCV 88.0 06/23/2018 1545   MCV 83 10/31/2017 1438   MCH 27.9 06/09/2018 2115   MCHC 32.8 06/23/2018 1545   RDW 15.4 06/23/2018 1545   RDW 14.4 10/31/2017 1438   LYMPHSABS 2.3 06/09/2018 2115   LYMPHSABS 1.6 10/31/2017 1438   MONOABS 0.8 06/09/2018 2115   EOSABS 0.3 06/09/2018 2115   EOSABS 0.2 10/31/2017 1438   BASOSABS 0.0 06/09/2018 2115   BASOSABS 0.0 10/31/2017 1438    Hgb A1C Lab Results  Component Value Date   HGBA1C 5.7 06/23/2018           Assessment & Plan:   Right Groin Pain, Urinary Frequency:  No indication for repeat xray at this time Urinalysis normal Will not send urine culture as symptoms are improving ? If he strained his Sartorius muscle 80 mg Depo IM today Stretching exercises given  Will have him follow up with Dr. Lorelei Pont for groin pain if symptoms persist or worsen Webb Silversmith, NP This visit occurred during the SARS-CoV-2 public health emergency.  Safety protocols were in place, including screening questions prior to the visit, additional usage of staff PPE, and extensive cleaning of exam room while observing appropriate contact time as indicated for disinfecting solutions.

## 2019-09-20 ENCOUNTER — Encounter (HOSPITAL_COMMUNITY): Payer: Self-pay | Admitting: Physician Assistant

## 2019-09-20 ENCOUNTER — Other Ambulatory Visit: Payer: Self-pay

## 2019-09-20 ENCOUNTER — Encounter (HOSPITAL_COMMUNITY): Payer: Self-pay

## 2019-09-20 ENCOUNTER — Telehealth: Payer: Self-pay | Admitting: Cardiovascular Disease

## 2019-09-20 ENCOUNTER — Encounter (HOSPITAL_COMMUNITY)
Admission: RE | Admit: 2019-09-20 | Discharge: 2019-09-20 | Disposition: A | Discharge status: Home / Self Care | Payer: PRIVATE HEALTH INSURANCE | Source: Ambulatory Visit | Attending: Surgery | Admitting: Surgery

## 2019-09-20 DIAGNOSIS — Z01812 Encounter for preprocedural laboratory examination: Secondary | ICD-10-CM | POA: Insufficient documentation

## 2019-09-20 HISTORY — DX: Pneumonia, unspecified organism: J18.9

## 2019-09-20 LAB — CBC
HCT: 47.3 % (ref 39.0–52.0)
Hemoglobin: 15.3 g/dL (ref 13.0–17.0)
MCH: 29.8 pg (ref 26.0–34.0)
MCHC: 32.3 g/dL (ref 30.0–36.0)
MCV: 92 fL (ref 80.0–100.0)
Platelets: 204 10*3/uL (ref 150–400)
RBC: 5.14 MIL/uL (ref 4.22–5.81)
RDW: 13.7 % (ref 11.5–15.5)
WBC: 7.6 10*3/uL (ref 4.0–10.5)
nRBC: 0 % (ref 0.0–0.2)

## 2019-09-20 LAB — COMPREHENSIVE METABOLIC PANEL
ALT: 38 U/L (ref 0–44)
AST: 33 U/L (ref 15–41)
Albumin: 4.3 g/dL (ref 3.5–5.0)
Alkaline Phosphatase: 69 U/L (ref 38–126)
Anion gap: 9 (ref 5–15)
BUN: 23 mg/dL — ABNORMAL HIGH (ref 6–20)
CO2: 27 mmol/L (ref 22–32)
Calcium: 9.4 mg/dL (ref 8.9–10.3)
Chloride: 107 mmol/L (ref 98–111)
Creatinine, Ser: 1.23 mg/dL (ref 0.61–1.24)
GFR calc Af Amer: >60 mL/min (ref >60)
GFR calc non Af Amer: >60 mL/min (ref >60)
Glucose, Bld: 92 mg/dL (ref 70–99)
Potassium: 4.6 mmol/L (ref 3.5–5.1)
Sodium: 143 mmol/L (ref 135–145)
Total Bilirubin: 1 mg/dL (ref 0.3–1.2)
Total Protein: 8.1 g/dL (ref 6.5–8.1)

## 2019-09-20 NOTE — Progress Notes (Signed)
DUE TO COVID-19 ONLY ONE VISITOR IS ALLOWED TO COME WITH YOU AND STAY IN THE WAITING ROOM ONLY DURING PRE OP AND PROCEDURE DAY OF SURGERY. THE 1 VISITOR  MAY VISIT WITH YOU AFTER SURGERY IN YOUR PRIVATE ROOM DURING VISITING HOURS ONLY!  YOU NEED TO HAVE A COVID 19 TEST ON_______ @_______ , THIS TEST MUST BE DONE BEFORE SURGERY,  COVID TESTING SITE [address] [address], IT IS ON THE RIGHT GOING OUT [address] APPROXIMATELY  2 MINUTES PAST [employer] ON THE RIGHT. ONCE YOUR COVID TEST IS COMPLETED,  PLEASE BEGIN THE QUARANTINE INSTRUCTIONS AS OUTLINED IN YOUR HANDOUT.                Richard [name]  09/20/2019   Your procedure is scheduled on: 09/28/2019    Report to [facility] Main  Entrance   Report to admitting at   0900 AM     Call this number if you have problems the morning of surgery [phone]    Remember: Do not eat food , candy gum or mints :After Midnight. You may have clear liquids from midnight until   0800am    CLEAR LIQUID DIET   Foods Allowed                                                                       Coffee and tea, regular and decaf                              Plain Jell-O any favor except red or purple                                            Fruit ices (not with fruit pulp)                                      Iced Popsicles                                     Carbonated beverages, regular and diet                                    Cranberry, grape and apple juices Sports drinks like Gatorade Lightly seasoned clear broth or consume(fat free) Sugar, honey syrup   _____________________________________________________________________    BRUSH YOUR TEETH MORNING OF SURGERY AND RINSE YOUR MOUTH OUT, NO CHEWING GUM CANDY OR MINTS.     Take these medicines the morning of surgery with A SIP OF WATER:   DO NOT TAKE ANY DIABETIC MEDICATIONS DAY OF YOUR SURGERY                               You may not  have any metal on your body including  hair pins and              piercings  Do not wear jewelry, make-up, lotions, powders or perfumes, deodorant             Do not wear nail polish on your fingernails.  Do not shave  48 hours prior to surgery.              Men may shave face and neck.   Do not bring valuables to the hospital. [place].  Contacts, dentures or bridgework may not be worn into surgery.  Leave suitcase in the car. After surgery it may be brought to your room.     Patients discharged the day of surgery will not be allowed to drive home. IF YOU ARE HAVING SURGERY AND GOING HOME THE SAME DAY, YOU MUST HAVE AN ADULT TO DRIVE YOU HOME AND BE WITH YOU FOR 24 HOURS. YOU MAY GO HOME BY TAXI OR UBER OR ORTHERWISE, BUT AN ADULT MUST ACCOMPANY YOU HOME AND STAY WITH YOU FOR 24 HOURS.  Name and phone number of your driver:  Special Instructions: N/A              Please read over the following fact sheets you were given: _____________________________________________________________________  [facility] - Preparing for Surgery Before surgery, you can play an important role.  Because skin is not sterile, your skin needs to be as free of germs as possible.  You can reduce the number of germs on your skin by washing with CHG (chlorahexidine gluconate) soap before surgery.  CHG is an antiseptic cleaner which kills germs and bonds with the skin to continue killing germs even after washing. Please DO NOT use if you have an allergy to CHG or antibacterial soaps.  If your skin becomes reddened/irritated stop using the CHG and inform your nurse when you arrive at Short Stay. Do not shave (including legs and underarms) for at least 48 hours prior to the first CHG shower.  You may shave your face/neck. Please follow these instructions carefully:  1.  Shower with CHG Soap the night before surgery and the  morning of Surgery.  2.  If you choose to wash your  hair, wash your hair first as usual with your  normal  shampoo.  3.  After you shampoo, rinse your hair and body thoroughly to remove the  shampoo.                           4.  Use CHG as you would any other liquid soap.  You can apply chg directly  to the skin and wash                       Gently with a scrungie or clean washcloth.  5.  Apply the CHG Soap to your body ONLY FROM THE NECK DOWN.   Do not use on face/ open                           Wound or open sores. Avoid contact with eyes, ears mouth and genitals (private parts).                       Wash face,  Genitals (private parts) with  your normal soap.             6.  Wash thoroughly, paying special attention to the area where your surgery  will be performed.  7.  Thoroughly rinse your body with warm water from the neck down.  8.  DO NOT shower/wash with your normal soap after using and rinsing off  the CHG Soap.                9.  Pat yourself dry with a clean towel.            10.  Wear clean pajamas.            11.  Place clean sheets on your bed the night of your first shower and do not  sleep with pets. Day of Surgery : Do not apply any lotions/deodorants the morning of surgery.  Please wear clean clothes to the hospital/surgery center.  FAILURE TO FOLLOW THESE INSTRUCTIONS MAY RESULT IN THE CANCELLATION OF YOUR SURGERY PATIENT SIGNATURE_________________________________  NURSE SIGNATURE__________________________________  ________________________________________________________________________

## 2019-09-20 NOTE — Telephone Encounter (Signed)
Patient is returning your call.  

## 2019-09-20 NOTE — Progress Notes (Addendum)
Anesthesia Review: COVID positive on 08/31/19- results on front of chart Roanna Banning made aware at preop appt on 09/20/19 Covid Infusion on 09/03/19   GYK:ZLDJTT Baity, NP  LOV- 09/16/19  Cardiologist :LOV- 07/13/19- DR Arida  Chest x-ray : EKG :  07/13/19 Echo : Stress test: Cardiac Cath :  Activity level: can not currently do a flight of steps due to back  Sleep Study/ CPAP : CPAp- pt to bringk mask and tubing day of surgery  Fasting Blood Sugar :      / Checks Blood Sugar -- times a day:   Blood Thinner/ Instructions /Last Dose: ASA / Instructions/ Last Dose :  Eliquis- as of preop appt on 09/20/19 pt has not ye treceived preop instructions regarding Eliquis.  Called office of [facility] and spoke with Triage.  Patient also called MD for preop instructions regarding Eliquis.

## 2019-09-20 NOTE — Progress Notes (Signed)
Patient states he has not received preopinstructions regarding Eliquis for surgery. Surgery on 09/28/2019.  Informed pt would call [facility] and spoke with Armon in Triage of above.  Armon stated she would inform assistant to DR Hassell Done in regards to this.

## 2019-09-20 NOTE — Telephone Encounter (Signed)
Returned the patients call. Lmtcb.   Per Dr. Tyrell Antonio 07/13/19 office visit notes and documentation:   Preop cardiovascular evaluation: The patient has no anginal symptoms and previous cardiac catheterization showed no coronary artery disease.  He has been stable from a cardiac standpoint and does not require cardiac testing before surgery.  Eliquis can be held 2 days before surgery and should be resumed postoperatively as soon as safe from a bleeding standpoint.

## 2019-09-20 NOTE — Telephone Encounter (Signed)
DPR on file. lmom with the instructions below. Patient is to contact the office if any questions.

## 2019-09-20 NOTE — Telephone Encounter (Signed)
Patient states he received clearance from Dr. Fletcher Anon in June for surgery, but has forgotten when he should stop his Eliquis. Please call to discuss.

## 2019-09-24 ENCOUNTER — Other Ambulatory Visit (HOSPITAL_COMMUNITY): Payer: No Typology Code available for payment source

## 2019-09-28 ENCOUNTER — Encounter (HOSPITAL_COMMUNITY): Admission: RE | Payer: Self-pay | Source: Ambulatory Visit

## 2019-09-28 ENCOUNTER — Inpatient Hospital Stay (HOSPITAL_COMMUNITY)
Admission: RE | Admit: 2019-09-28 | Payer: No Typology Code available for payment source | Source: Ambulatory Visit | Admitting: Surgery

## 2019-09-28 LAB — TYPE AND SCREEN
ABO/RH(D): A POS
Antibody Screen: NEGATIVE

## 2019-09-28 SURGERY — GASTRECTOMY, SLEEVE, LAPAROSCOPIC
Anesthesia: General

## 2019-10-04 ENCOUNTER — Telehealth: Payer: Self-pay | Admitting: Internal Medicine

## 2019-10-04 NOTE — Telephone Encounter (Signed)
Pt called stating he was schedule for bariatric  surgery on 9/7 but pt had to r/s because he had covid.  They told him he would have to wait 6-8 weeks to r/s.  Marland Kitchen  He wanted to know if you could call to see what is going on.  He   He is getting frustrated with them  everytime he calls he is told they will take his number and someone will call him back he never receives a call back.

## 2019-10-06 NOTE — Telephone Encounter (Signed)
Me calling their front office isn't going to change anything. I would encourage him to keep calling until he gets through or can he not send a message to the provider through mychart?

## 2019-10-08 ENCOUNTER — Other Ambulatory Visit: Payer: Self-pay | Admitting: Cardiovascular Disease

## 2019-10-08 NOTE — Telephone Encounter (Signed)
Prescription refill request for Eliquis received.  Last office visit: Fletcher Anon, 07/13/2019 Scr: 1.23, 09/20/2019 Age: 59 yo Weight: 136.9 kg   Prescription refill sent.

## 2019-10-08 NOTE — Telephone Encounter (Signed)
Refill request

## 2019-10-12 ENCOUNTER — Ambulatory Visit: Payer: No Typology Code available for payment source | Admitting: Skilled Nursing Facility1

## 2019-10-14 DIAGNOSIS — M25562 Pain in left knee: Secondary | ICD-10-CM | POA: Diagnosis not present

## 2019-10-14 DIAGNOSIS — M25561 Pain in right knee: Secondary | ICD-10-CM | POA: Diagnosis not present

## 2019-10-18 ENCOUNTER — Telehealth: Payer: PRIVATE HEALTH INSURANCE | Admitting: Internal Medicine

## 2019-10-18 ENCOUNTER — Telehealth (INDEPENDENT_AMBULATORY_CARE_PROVIDER_SITE_OTHER): Payer: PPO | Admitting: Internal Medicine

## 2019-10-18 ENCOUNTER — Encounter: Payer: Self-pay | Admitting: Internal Medicine

## 2019-10-18 DIAGNOSIS — J029 Acute pharyngitis, unspecified: Secondary | ICD-10-CM

## 2019-10-18 MED ORDER — PREDNISONE 10 MG PO TABS
10.0000 mg | ORAL_TABLET | Freq: Every day | ORAL | 0 refills | Status: DC
Start: 2019-10-18 — End: 2019-10-27

## 2019-10-18 NOTE — Patient Instructions (Addendum)
DUE TO COVID-19 ONLY ONE VISITOR IS ALLOWED TO COME WITH YOU AND STAY IN THE WAITING ROOM ONLY DURING PRE OP AND PROCEDURE DAY OF SURGERY. THE 1 VISITOR  MAY VISIT WITH YOU AFTER SURGERY IN YOUR PRIVATE ROOM DURING VISITING HOURS ONLY!                Richard [name]    Your procedure is scheduled on: 10/26/19   Report to [facility] Main  Entrance   Report to short stay at : 5:30 AM     Call this number if you have problems the morning of surgery [phone]    Remember: Do not eat food or drink liquids :After Midnight.   BRUSH YOUR TEETH MORNING OF SURGERY AND RINSE YOUR MOUTH OUT, NO CHEWING GUM CANDY OR MINTS.     Take these medicines the morning of surgery with A SIP OF WATER: metoprolol,tambocor.                               You may not have any metal on your body including hair pins and              piercings  Do not wear jewelry, lotions, powders or perfumes, deodorant             Men may shave face and neck.   Do not bring valuables to the hospital. [name].  Contacts, dentures or bridgework may not be worn into surgery.  Leave suitcase in the car. After surgery it may be brought to your room.     Patients discharged the day of surgery will not be allowed to drive home. IF YOU ARE HAVING SURGERY AND GOING HOME THE SAME DAY, YOU MUST HAVE AN ADULT TO DRIVE YOU HOME AND BE WITH YOU FOR 24 HOURS. YOU MAY GO HOME BY TAXI OR UBER OR ORTHERWISE, BUT AN ADULT MUST ACCOMPANY YOU HOME AND STAY WITH YOU FOR 24 HOURS.  Name and phone number of your driver:  Special Instructions: N/A              Please read over the following fact sheets you were given: _____________________________________________________________________    PLEASE BRING CPAP MASK [place]. DEVICE WILL BE PROVIDED!       [facility] - Preparing for Surgery Before surgery, you can play an important role.  Because skin is not sterile, your skin  needs to be as free of germs as possible.  You can reduce the number of germs on your skin by washing with CHG (chlorahexidine gluconate) soap before surgery.  CHG is an antiseptic cleaner which kills germs and bonds with the skin to continue killing germs even after washing. Please DO NOT use if you have an allergy to CHG or antibacterial soaps.  If your skin becomes reddened/irritated stop using the CHG and inform your nurse when you arrive at Short Stay. Do not shave (including legs and underarms) for at least 48 hours prior to the first CHG shower.  You may shave your face/neck. Please follow these instructions carefully:  1.  Shower with CHG Soap the night before surgery and the  morning of Surgery.  2.  If you choose to wash your hair, wash your hair first as usual with your  normal  shampoo.  3.  After you shampoo, rinse your hair and body thoroughly to remove the  shampoo.                           4.  Use CHG as you would any other liquid soap.  You can apply chg directly  to the skin and wash                       Gently with a scrungie or clean washcloth.  5.  Apply the CHG Soap to your body ONLY FROM THE NECK DOWN.   Do not use on face/ open                           Wound or open sores. Avoid contact with eyes, ears mouth and genitals (private parts).                       Wash face,  Genitals (private parts) with your normal soap.             6.  Wash thoroughly, paying special attention to the area where your surgery  will be performed.  7.  Thoroughly rinse your body with warm water from the neck down.  8.  DO NOT shower/wash with your normal soap after using and rinsing off  the CHG Soap.                9.  Pat yourself dry with a clean towel.            10.  Wear clean pajamas.            11.  Place clean sheets on your bed the night of your first shower and do not  sleep with pets. Day of Surgery : Do not apply any lotions/deodorants the morning of surgery.  Please wear clean  clothes to the hospital/surgery center.  FAILURE TO FOLLOW THESE INSTRUCTIONS MAY RESULT IN THE CANCELLATION OF YOUR SURGERY PATIENT SIGNATURE_________________________________  NURSE SIGNATURE__________________________________  ________________________________________________________________________

## 2019-10-18 NOTE — Patient Instructions (Signed)
Sore Throat When you have a sore throat, your throat may feel:  Tender.  Burning.  Irritated.  Scratchy.  Painful when you swallow.  Painful when you talk. Many things can cause a sore throat, such as:  An infection.  Allergies.  Dry air.  Smoke or pollution.  Radiation treatment.  Gastroesophageal reflux disease (GERD).  A tumor. A sore throat can be the first sign of another sickness. It can happen with other problems, like:  Coughing.  Sneezing.  Fever.  Swelling in the neck. Most sore throats go away without treatment. Follow these instructions at home:      Take over-the-counter medicines only as told by your doctor. ? If your child has a sore throat, do not give your child aspirin.  Drink enough fluids to keep your pee (urine) pale yellow.  Rest when you feel you need to.  To help with pain: ? Sip warm liquids, such as broth, herbal tea, or warm water. ? Eat or drink cold or frozen liquids, such as frozen ice pops. ? Gargle with a salt-water mixture 3-4 times a day or as needed. To make a salt-water mixture, add -1 tsp (3-6 g) of salt to 1 cup (237 mL) of warm water. Mix it until you cannot see the salt anymore. ? Suck on hard candy or throat lozenges. ? Put a cool-mist humidifier in your bedroom at night. ? Sit in the bathroom with the door closed for 5-10 minutes while you run hot water in the shower.  Do not use any products that contain nicotine or tobacco, such as cigarettes, e-cigarettes, and chewing tobacco. If you need help quitting, ask your doctor.  Wash your hands well and often with soap and water. If soap and water are not available, use hand sanitizer. Contact a doctor if:  You have a fever for more than 2-3 days.  You keep having symptoms for more than 2-3 days.  Your throat does not get better in 7 days.  You have a fever and your symptoms suddenly get worse.  Your child who is 3 months to 3 years old has a temperature of  102.2F (39C) or higher. Get help right away if:  You have trouble breathing.  You cannot swallow fluids, soft foods, or your saliva.  You have swelling in your throat or neck that gets worse.  You keep feeling sick to your stomach (nauseous).  You keep throwing up (vomiting). Summary  A sore throat is pain, burning, irritation, or scratchiness in the throat. Many things can cause a sore throat.  Take over-the-counter medicines only as told by your doctor. Do not give your child aspirin.  Drink plenty of fluids, and rest as needed.  Contact a doctor if your symptoms get worse or your sore throat does not get better within 7 days. This information is not intended to replace advice given to you by your health care provider. Make sure you discuss any questions you have with your health care provider. Document Revised: 06/09/2017 Document Reviewed: 06/09/2017 Elsevier Patient Education  2020 [employer]vier Inc.  

## 2019-10-18 NOTE — Progress Notes (Signed)
Virtual Visit via Video Note  I connected with Tressia Miners on 10/18/19 at 10:15 AM EDT by a video enabled telemedicine application and verified that I am speaking with the correct person using two identifiers.  Location: Patient: Home Provider: Office  Person's participating in this video visit: Webb Silversmith, NP and Ivette Loyal   I discussed the limitations of evaluation and management by telemedicine and the availability of in person appointments. The patient expressed understanding and agreed to proceed.  History of Present Illness:  Pt reports sore throat. This started 2 days ago. It is mainly on one side but he has not noticed any ear pain, nasal congestion, runny nose or cough. He is having difficulty swallowing, has not noticed any drainage. He denies headache, dizziness, loss of taste/smell, or SOB. He denies fever, chills or body aches. He has tried Tylenol OTC. He has not had exposure to Covid that he is aware of. He is scheduled for his gastric sleeve next week.    Past Medical History:  Diagnosis Date  . Arthritis   . Arthrofibrosis of total knee arthroplasty ([place]) 05/30/2015  . Bilateral pulmonary embolism ([place])    a. 03/2015 CTA: acute bilat PE; b. IVC filter placed in 2017--> removed 2018.  Marland Kitchen Chest pain    a. 2015 Abnl MV;  b. 2015 Cath: nl cors.  . Chronic diastolic CHF (congestive heart failure) ([place])    a. 03/2015 Echo: EF 55-65%, poor windows, mildly dil RV.  Marland Kitchen DVT (deep venous thrombosis) ([place])    a. 03/2015 U/S: occlusive DVT w/in the distal aspect of the Left fem vein through the L popliteal vein.  Marland Kitchen Dysrhythmia    afib   . GERD (gastroesophageal reflux disease)   . Headache   . Hypertension   . Hypertensive heart disease   . Obesity   . OSA (obstructive sleep apnea)    uses CPAP nightly settings at 15   . PAF (paroxysmal atrial fibrillation) ([place])   . Pneumonia    hx of years ago   . Primary localized osteoarthritis of left knee    a. 11/2014 s/p L  TKA.  . Primary localized osteoarthritis of right knee    a. 02/2014 s/p R Partial Knee arthroplasty.  . Stiffness of left knee    a. 12/2014 s/p manipulation under anesthesia.  . Urine incontinence     Current Outpatient Medications  Medication Sig Dispense Refill  . acetaminophen (TYLENOL) 500 MG tablet Take 1,000 mg by mouth every 6 (six) hours as needed for moderate pain.    . baclofen (LIORESAL) 10 MG tablet TAKE 1 TO 2 TABLETS BY MOUTH 3 TIMES DAILY AS NEEDED FOR MUSCLE SPASMS (Patient taking differently: Take 10 mg by mouth 3 (three) times daily as needed for muscle spasms. ) 180 each 0  . cyanocobalamin (,VITAMIN B-12,) 1000 MCG/ML injection Inject 1,000 mcg into the muscle every 30 (thirty) days.    Marland Kitchen ELIQUIS 5 MG TABS tablet TAKE 1 TABLET BY MOUTH 2 TIMES DAILY (Patient taking differently: Take 5 mg by mouth 2 (two) times daily. ) 60 tablet 6  . flecainide (TAMBOCOR) 100 MG tablet TAKE 1 TABLET BY MOUTH TWICE (2) DAILY (Patient taking differently: Take 100 mg by mouth 2 (two) times daily. ) 180 tablet 3  . furosemide (LASIX) 40 MG tablet TAKE 1 TABLET (40 MG TOTAL) BY MOUTH DAILY AS NEEDED. MUST SCHEDULE ANNUAL EXAM (Patient taking differently: Take 40 mg by mouth daily as needed (legs swelling). )  30 tablet 0  . HYDROcodone-acetaminophen (NORCO/VICODIN) 5-325 MG tablet Take 1 tablet by mouth every 6 (six) hours as needed for moderate pain. (Patient taking differently: Take 1 tablet by mouth every 6 (six) hours as needed for severe pain. ) 15 tablet 0  . metoprolol succinate (TOPROL-XL) 25 MG 24 hr tablet TAKE 1 TABLET BY MOUTH ONCE A DAY WITH OR IMMEDIATELY FOLLOWING A MEAL (Patient taking differently: Take 25 mg by mouth daily. ) 90 tablet 3  . Sennosides-Docusate Sodium (SM STOOL SOFTENER/LAXATIVE PO) Take 1 tablet by mouth daily as needed (constipation.).  (Patient not taking: Reported on 10/13/2019)    . traMADol (ULTRAM) 50 MG tablet TAKE 2 TABLETS BY MOUTH AT BEDTIME AS NEEDED  (Patient taking differently: Take 50-100 mg by mouth daily as needed (pain.). ) 60 tablet 0  . vitamin B-12 (CYANOCOBALAMIN) 500 MCG tablet Take 500 mcg by mouth daily. (Patient not taking: Reported on 10/13/2019)    . vitamin B-12 (CYANOCOBALAMIN) 500 MCG tablet Take 500 mcg by mouth daily.     Current Facility-Administered Medications  Medication Dose Route Frequency Provider Last Rate Last Admin  . cyanocobalamin ((VITAMIN B-12)) injection 1,000 mcg  1,000 mcg Intramuscular Once Jearld Fenton, NP        Allergies  Allergen Reactions  . Penicillins Anaphylaxis and Other (See Comments)    From childhood; uncertain of reaction Has patient had a PCN reaction causing immediate rash, facial/tongue/throat swelling, SOB or lightheadedness with hypotension: Unk Has patient had a PCN reaction causing severe rash involving mucus membranes or skin necrosis: Unk Has patient had a PCN reaction that required hospitalization: Unk Has patient had a PCN reaction occurring within the last 10 years: No If all of the above answers are "NO", then may proceed with Cephalosporin use.  . Vancomycin Other (See Comments)    [place] Mans' syndrome    Family History  Problem Relation Age of Onset  . Coronary artery disease Mother   . Hyperlipidemia Mother   . Hypertension Mother   . Arthritis Mother   . Coronary artery disease Father   . Heart disease Paternal Grandmother   . Alzheimer's disease Paternal Grandfather     Social History   Socioeconomic History  . Marital status: Married    Spouse name: Not on file  . Number of children: 2  . Years of education: 43  . Highest education level: Not on file  Occupational History  . Occupation: Energy manager: [employer]  Tobacco Use  . Smoking status: Never Smoker  . Smokeless tobacco: Never Used  Vaping Use  . Vaping Use: Never used  Substance and Sexual Activity  . Alcohol use: Not Currently    Comment: occassional  . Drug use: No  . Sexual  activity: Yes  Other Topics Concern  . Not on file  Social History Narrative  . Not on file   Social Determinants of Health   Financial Resource Strain:   . Difficulty of Paying Living Expenses: Not on file  Food Insecurity:   . Worried About Charity fundraiser in the Last Year: Not on file  . Ran Out of Food in the Last Year: Not on file  Transportation Needs:   . Lack of Transportation (Medical): Not on file  . Lack of Transportation (Non-Medical): Not on file  Physical Activity:   . Days of Exercise per Week: Not on file  . Minutes of Exercise per Session: Not on file  Stress:   .  Feeling of Stress : Not on file  Social Connections:   . Frequency of Communication with Friends and Family: Not on file  . Frequency of Social Gatherings with Friends and Family: Not on file  . Attends Religious Services: Not on file  . Active Member of Clubs or Organizations: Not on file  . Attends Archivist Meetings: Not on file  . Marital Status: Not on file  Intimate Partner Violence:   . Fear of Current or Ex-Partner: Not on file  . Emotionally Abused: Not on file  . Physically Abused: Not on file  . Sexually Abused: Not on file     Constitutional: Denies fever, malaise, fatigue, headache or abrupt weight changes.  HEENT: Pt reports sore throat. Denies eye pain, eye redness, ear pain, ringing in the ears, wax buildup, runny nose, nasal congestion, bloody nose. Respiratory: Denies difficulty breathing, shortness of breath, cough or sputum production.   Cardiovascular: Denies chest pain, chest tightness, palpitations or swelling in the hands or feet.  Skin: Denies redness, rashes, lesions or ulcercations.   No other specific complaints in a complete review of systems (except as listed in HPI above).    Observations/Objective:   Wt Readings from Last 3 Encounters:  09/20/19 (!) 301 lb 12.8 oz (136.9 kg)  09/16/19 (!) 302 lb (137 kg)  07/13/19 (!) 304 lb (137.9 kg)     General: Appears his stated age, obese, obese, in NAD. HEENT: Head: normal shape and size; Throat/Mouth: hoarseness noted Pulmonary/Chest: Normal effort. No respiratory distress.  Neurological: Alert and oriented.   BMET    Component Value Date/Time   NA 143 09/20/2019 1144   NA 141 10/31/2017 1438   K 4.6 09/20/2019 1144   CL 107 09/20/2019 1144   CO2 27 09/20/2019 1144   GLUCOSE 92 09/20/2019 1144   BUN 23 (H) 09/20/2019 1144   BUN 18 10/31/2017 1438   CREATININE 1.23 09/20/2019 1144   CREATININE 1.27 12/06/2015 1600   CALCIUM 9.4 09/20/2019 1144   GFRNONAA >60 09/20/2019 1144   GFRAA >60 09/20/2019 1144    Lipid Panel     Component Value Date/Time   CHOL 164 06/23/2018 1545   CHOL 170 10/31/2017 1438   TRIG 91.0 06/23/2018 1545   HDL 40.20 06/23/2018 1545   HDL 36 (L) 10/31/2017 1438   CHOLHDL 4 06/23/2018 1545   VLDL 18.2 06/23/2018 1545   LDLCALC 106 (H) 06/23/2018 1545   LDLCALC 108 (H) 10/31/2017 1438    CBC    Component Value Date/Time   WBC 7.6 09/20/2019 1144   RBC 5.14 09/20/2019 1144   HGB 15.3 09/20/2019 1144   HGB 14.0 10/31/2017 1438   HCT 47.3 09/20/2019 1144   HCT 43.5 10/31/2017 1438   PLT 204 09/20/2019 1144   PLT 220 10/31/2017 1438   MCV 92.0 09/20/2019 1144   MCV 83 10/31/2017 1438   MCH 29.8 09/20/2019 1144   MCHC 32.3 09/20/2019 1144   RDW 13.7 09/20/2019 1144   RDW 14.4 10/31/2017 1438   LYMPHSABS 2.3 06/09/2018 2115   LYMPHSABS 1.6 10/31/2017 1438   MONOABS 0.8 06/09/2018 2115   EOSABS 0.3 06/09/2018 2115   EOSABS 0.2 10/31/2017 1438   BASOSABS 0.0 06/09/2018 2115   BASOSABS 0.0 10/31/2017 1438    Hgb A1C Lab Results  Component Value Date   HGBA1C 5.7 06/23/2018       Assessment and Plan:  Sore Throat:  Likely allergy vs viral RX for Prednisone 10 mg daily  x 3 day  Return precautions discussed  Follow Up Instructions:    I discussed the assessment and treatment plan with the patient. The patient was  provided an opportunity to ask questions and all were answered. The patient agreed with the plan and demonstrated an understanding of the instructions.   The patient was advised to call back or seek an in-person evaluation if the symptoms worsen or if the condition fails to improve as anticipated.   Webb Silversmith, NP

## 2019-10-18 NOTE — Progress Notes (Signed)
Pt. Needs new orders for upcomming surgery.PST appointment on 10/19/19.Thanks

## 2019-10-19 ENCOUNTER — Encounter (HOSPITAL_COMMUNITY): Payer: Self-pay

## 2019-10-19 ENCOUNTER — Ambulatory Visit: Payer: Self-pay | Admitting: Surgery

## 2019-10-19 ENCOUNTER — Other Ambulatory Visit: Payer: Self-pay

## 2019-10-19 ENCOUNTER — Encounter (HOSPITAL_COMMUNITY)
Admission: RE | Admit: 2019-10-19 | Discharge: 2019-10-19 | Disposition: A | Discharge status: Home / Self Care | Payer: PRIVATE HEALTH INSURANCE | Source: Ambulatory Visit | Attending: Surgery | Admitting: Surgery

## 2019-10-19 DIAGNOSIS — Z01818 Encounter for other preprocedural examination: Secondary | ICD-10-CM | POA: Insufficient documentation

## 2019-10-19 DIAGNOSIS — Z86718 Personal history of other venous thrombosis and embolism: Secondary | ICD-10-CM | POA: Insufficient documentation

## 2019-10-19 DIAGNOSIS — K449 Diaphragmatic hernia without obstruction or gangrene: Secondary | ICD-10-CM | POA: Diagnosis not present

## 2019-10-19 DIAGNOSIS — I110 Hypertensive heart disease with heart failure: Secondary | ICD-10-CM | POA: Insufficient documentation

## 2019-10-19 DIAGNOSIS — I480 Paroxysmal atrial fibrillation: Secondary | ICD-10-CM | POA: Insufficient documentation

## 2019-10-19 DIAGNOSIS — Z8616 Personal history of COVID-19: Secondary | ICD-10-CM | POA: Diagnosis not present

## 2019-10-19 DIAGNOSIS — Z96653 Presence of artificial knee joint, bilateral: Secondary | ICD-10-CM | POA: Diagnosis not present

## 2019-10-19 DIAGNOSIS — Z86711 Personal history of pulmonary embolism: Secondary | ICD-10-CM | POA: Insufficient documentation

## 2019-10-19 DIAGNOSIS — K219 Gastro-esophageal reflux disease without esophagitis: Secondary | ICD-10-CM | POA: Diagnosis not present

## 2019-10-19 DIAGNOSIS — Z79899 Other long term (current) drug therapy: Secondary | ICD-10-CM | POA: Diagnosis not present

## 2019-10-19 DIAGNOSIS — Z7901 Long term (current) use of anticoagulants: Secondary | ICD-10-CM | POA: Diagnosis not present

## 2019-10-19 DIAGNOSIS — G4733 Obstructive sleep apnea (adult) (pediatric): Secondary | ICD-10-CM | POA: Insufficient documentation

## 2019-10-19 DIAGNOSIS — I509 Heart failure, unspecified: Secondary | ICD-10-CM | POA: Insufficient documentation

## 2019-10-19 HISTORY — DX: Dyspnea, unspecified: R06.00

## 2019-10-19 LAB — COMPREHENSIVE METABOLIC PANEL
ALT: 34 U/L (ref 0–44)
AST: 27 U/L (ref 15–41)
Albumin: 3.9 g/dL (ref 3.5–5.0)
Alkaline Phosphatase: 64 U/L (ref 38–126)
Anion gap: 9 (ref 5–15)
BUN: 17 mg/dL (ref 6–20)
CO2: 27 mmol/L (ref 22–32)
Calcium: 9.5 mg/dL (ref 8.9–10.3)
Chloride: 104 mmol/L (ref 98–111)
Creatinine, Ser: 1.2 mg/dL (ref 0.61–1.24)
GFR calc Af Amer: >60 mL/min (ref >60)
GFR calc non Af Amer: >60 mL/min (ref >60)
Glucose, Bld: 86 mg/dL (ref 70–99)
Potassium: 4.9 mmol/L (ref 3.5–5.1)
Sodium: 140 mmol/L (ref 135–145)
Total Bilirubin: 0.3 mg/dL (ref 0.3–1.2)
Total Protein: 7.6 g/dL (ref 6.5–8.1)

## 2019-10-19 LAB — BASIC METABOLIC PANEL
Anion gap: 8 (ref 5–15)
BUN: 18 mg/dL (ref 6–20)
CO2: 28 mmol/L (ref 22–32)
Calcium: 9.7 mg/dL (ref 8.9–10.3)
Chloride: 105 mmol/L (ref 98–111)
Creatinine, Ser: 1.26 mg/dL — ABNORMAL HIGH (ref 0.61–1.24)
GFR calc Af Amer: >60 mL/min (ref >60)
GFR calc non Af Amer: >60 mL/min (ref >60)
Glucose, Bld: 88 mg/dL (ref 70–99)
Potassium: 4.9 mmol/L (ref 3.5–5.1)
Sodium: 141 mmol/L (ref 135–145)

## 2019-10-19 LAB — DIFFERENTIAL
Abs Immature Granulocytes: 0.05 10*3/uL (ref 0.00–0.07)
Basophils Absolute: 0.1 10*3/uL (ref 0.0–0.1)
Basophils Relative: 1 %
Eosinophils Absolute: 0.2 10*3/uL (ref 0.0–0.5)
Eosinophils Relative: 2 %
Immature Granulocytes: 1 %
Lymphocytes Relative: 12 %
Lymphs Abs: 1.1 10*3/uL (ref 0.7–4.0)
Monocytes Absolute: 0.8 10*3/uL (ref 0.1–1.0)
Monocytes Relative: 8 %
Neutro Abs: 7.5 10*3/uL (ref 1.7–7.7)
Neutrophils Relative %: 76 %

## 2019-10-19 LAB — CBC
HCT: 49.1 % (ref 39.0–52.0)
Hemoglobin: 15.9 g/dL (ref 13.0–17.0)
MCH: 30 pg (ref 26.0–34.0)
MCHC: 32.4 g/dL (ref 30.0–36.0)
MCV: 92.6 fL (ref 80.0–100.0)
Platelets: 218 10*3/uL (ref 150–400)
RBC: 5.3 MIL/uL (ref 4.22–5.81)
RDW: 13.5 % (ref 11.5–15.5)
WBC: 9.7 10*3/uL (ref 4.0–10.5)
nRBC: 0 % (ref 0.0–0.2)

## 2019-10-19 NOTE — Progress Notes (Signed)
COVID Vaccine Completed: Yes Date COVID Vaccine completed: 06/09/19 COVID vaccine manufacturer:    [employer]     PCP - Webb Silversmith: NP. Cardiologist - Dr. Vance Peper  Chest x-ray - 11/26/18 EKG - 07/13/19 Stress Test -  ECHO -  Cardiac Cath -  Pacemaker/ICD device last checked:  Sleep Study -  CPAP -   Fasting Blood Sugar -  Checks Blood Sugar _____ times a day  Blood Thinner Instructions: Eliquis will be hold two days before surgery as per cardiologist instructions. Aspirin Instructions: Last Dose:  Anesthesia review: Hx: OSA (CPAP),CHF,Dysrhythmias,HTN,PE.  Patient denies shortness of breath, fever, cough and chest pain at PAT appointment   Patient verbalized understanding of instructions that were given to them at the PAT appointment. Patient was also instructed that they will need to review over the PAT instructions again at home before surgery.

## 2019-10-19 NOTE — Progress Notes (Signed)
Message was left to Dr. Hassell Done with the [facility] nurse about orders for surgery.

## 2019-10-20 NOTE — Anesthesia Preprocedure Evaluation (Addendum)
Anesthesia Evaluation  Patient identified by MRN, date of birth, ID band Patient awake    Reviewed: Allergy & Precautions, NPO status , Patient's Chart, lab work & pertinent test results  Airway Mallampati: II  TM Distance: <3 FB Neck ROM: Full    Dental no notable dental hx.    Pulmonary shortness of breath and at rest, sleep apnea ,    breath sounds clear to auscultation + decreased breath sounds      Cardiovascular hypertension, Normal cardiovascular exam Rhythm:Regular Rate:Normal     Neuro/Psych negative neurological ROS  negative psych ROS   GI/Hepatic negative GI ROS, Neg liver ROS,   Endo/Other  Morbid obesity  Renal/GU negative Renal ROS  negative genitourinary   Musculoskeletal negative musculoskeletal ROS (+)   Abdominal   Peds negative pediatric ROS (+)  Hematology negative hematology ROS (+)   Anesthesia Other Findings   Reproductive/Obstetrics negative OB ROS                           Anesthesia Physical Anesthesia Plan  ASA: III  Anesthesia Plan: General   Post-op Pain Management:    Induction: Intravenous  PONV Risk Score and Plan: 2 and Ondansetron, Dexamethasone and Treatment may vary due to age or medical condition  Airway Management Planned: Oral ETT  Additional Equipment:   Intra-op Plan:   Post-operative Plan: Extubation in OR  Informed Consent: I have reviewed the patients History and Physical, chart, labs and discussed the procedure including the risks, benefits and alternatives for the proposed anesthesia with the patient or authorized representative who has indicated his/her understanding and acceptance.     Dental advisory given  Plan Discussed with: CRNA and Surgeon  Anesthesia Plan Comments: (See PAT note 10/19/2019, Konrad Felix, PA-C)       Anesthesia Quick Evaluation

## 2019-10-20 NOTE — Progress Notes (Signed)
Anesthesia Chart Review   Case: [id number] Date/Time: 10/26/19 0715   Procedures:      LAPAROSCOPIC SLEEVE GASTRECTOMY (N/A )     UPPER GI ENDOSCOPY (N/A )     HIATAL HERNIA REPAIR (N/A )   Anesthesia type: General   Pre-op diagnosis: morbid obesity   Location: [place] [facility] / [facility]   Surgeons: Johnathan Hausen, MD      DISCUSSION:59 y.o. never smoker with h/o HTN, CHF, GERD, DVT, PE, PAF (on Eliquis), OSA on CPAP, morbid obesity scheduled for above procedure 10/26/2019 with Dr. Johnathan Hausen.   Per cardiologist, Dr. Fletcher Anon, 09/20/2019, "Preop cardiovascular evaluation: The patient has no anginal symptoms and previous cardiac catheterization showed no coronary artery disease. He has been stable from a cardiac standpoint and does not require cardiac testing before surgery. Eliquis can be held 2 days before surgery and should be resumed postoperatively as soon as safe from a bleeding standpoint."  Positive for COVID 08/31/2019.  No need to retest as this is within the last 90 days. Results in [place], printed and on chart as well.   Anticipate pt can proceed with planned procedure barring acute status change.   VS: BP (!) 147/69   Pulse (!) 52   Temp 36.5 C (Oral)   Resp (!) 99   Ht 5\' 9"  (1.753 m)   Wt (!) 138.3 kg   BMI 45.04 kg/m   PROVIDERS: Jearld Fenton, NP is PCP   Aris Everts, MD is Cardiologist  LABS: Labs reviewed: Acceptable for surgery. (all labs ordered are listed, but only abnormal results are displayed)  Labs Reviewed  BASIC METABOLIC PANEL - Abnormal; Notable for the following components:      Result Value   Creatinine, Ser 1.26 (*)    All other components within normal limits  CBC  COMPREHENSIVE METABOLIC PANEL  DIFFERENTIAL     IMAGES:   EKG: 07/13/2019 Rate 57 bpm  Sinus bradycardia  Pulmonary disease pattern  LAFB  CV: Echo 03/27/2015 Study Conclusions   - Procedure narrative: Transthoracic echocardiography. Image  quality was  suboptimal. The study was technically difficult, as a  result of poor acoustic windows, poor sound wave transmission,  and body habitus.  - Left ventricle: Systolic function was normal. The estimated  ejection fraction was in the range of 55% to 65%.  - Aortic valve: Valve area (Vmax): 3.3 cm^2.  - Right ventricle: The cavity size was mildly dilated. Past Medical History:  Diagnosis Date  . Arthritis   . Arthrofibrosis of total knee arthroplasty ([place]) 05/30/2015  . Bilateral pulmonary embolism ([place])    a. 03/2015 CTA: acute bilat PE; b. IVC filter placed in 2017--> removed 2018.  Marland Kitchen Chest pain    a. 2015 Abnl MV;  b. 2015 Cath: nl cors.  . Chronic diastolic CHF (congestive heart failure) ([place])    a. 03/2015 Echo: EF 55-65%, poor windows, mildly dil RV.  Marland Kitchen DVT (deep venous thrombosis) ([place])    a. 03/2015 U/S: occlusive DVT w/in the distal aspect of the Left fem vein through the L popliteal vein.  Marland Kitchen Dyspnea    On exertion  . Dysrhythmia    afib   . GERD (gastroesophageal reflux disease)   . Headache   . Hypertension   . Hypertensive heart disease   . Obesity   . OSA (obstructive sleep apnea)    uses CPAP nightly settings at 15   . PAF (paroxysmal atrial fibrillation) ([place])   . Pneumonia  hx of years ago   . Primary localized osteoarthritis of left knee    a. 11/2014 s/p L TKA.  . Primary localized osteoarthritis of right knee    a. 02/2014 s/p R Partial Knee arthroplasty.  . Stiffness of left knee    a. 12/2014 s/p manipulation under anesthesia.  . Urine incontinence     Past Surgical History:  Procedure Laterality Date  . APPENDECTOMY    . CARDIAC CATHETERIZATION    . CARPAL TUNNEL RELEASE Left 03/2018  . EXAM UNDER ANESTHESIA WITH MANIPULATION OF KNEE Left 05/30/2015   Procedure: EXAM UNDER ANESTHESIA WITH MANIPULATION OF LEFT KNEE;  Surgeon: Marchia Bond, MD;  Location: [place];  Service: Orthopedics;  Laterality: Left;  . I & D KNEE WITH POLY EXCHANGE Left 10/30/2015    Procedure: IRRIGATION AND DEBRIDEMENT KNEE WITH POLY EXCHANGE PLACE SPACERS;  Surgeon: Frederik Pear, MD;  Location: [place];  Service: Orthopedics;  Laterality: Left;  OFF ELIQUIST 3 DAYS  . IVC FILTER PLACEMENT ([facility] HX)  04/13/15  . IVC FILTER REMOVAL N/A 08/13/2016   Procedure: IVC Filter Removal;  Surgeon: Katha Cabal, MD;  Location: [place] [facility];  Service: Cardiovascular;  Laterality: N/A;  . JOINT REPLACEMENT    . KNEE ARTHROSCOPY Left 08/08/2015   Procedure: LEFT KNEE MANIPULATION WITH ARTHROSCOPIC LYSIS OF ADHESIONS AND ASPIRATION;  Surgeon: Marchia Bond, MD;  Location: [place];  Service: Orthopedics;  Laterality: Left;  . KNEE CLOSED REDUCTION Left 01/20/2015   Procedure: LEFT KNEE MANIPULATION;  Surgeon: Marchia Bond, MD;  Location: [place];  Service: Orthopedics;  Laterality: Left;  . LEFT HEART CATHETERIZATION WITH CORONARY ANGIOGRAM N/A 01/12/2014   Procedure: LEFT HEART CATHETERIZATION WITH CORONARY ANGIOGRAM;  Surgeon: Jettie Booze, MD;  Location: [facility] CATH LAB;  Service: Cardiovascular;  Laterality: N/A;  . ORTHOPEDIC SURGERY Left    arthroscopy x3  . PARTIAL KNEE ARTHROPLASTY Right 02/25/2014   Procedure: RIGHT KNEE ARTHROPLASTY CONDYLE AND PLATEAU MEDIAL COMPARTMENT ;  Surgeon: Johnny Bridge, MD;  Location: [place];  Service: Orthopedics;  Laterality: Right;  . PERIPHERAL VASCULAR CATHETERIZATION N/A 03/29/2015   Procedure: IVC Filter Insertion, and possible thrombectomy;  Surgeon: Katha Cabal, MD;  Location: [place] CV LAB;  Service: Cardiovascular;  Laterality: N/A;  . ROTATOR CUFF REPAIR Left   . TOTAL KNEE ARTHROPLASTY  12/06/2014   Procedure: TOTAL KNEE ARTHROPLASTY;  Surgeon: Marchia Bond, MD;  Location: [place];  Service: Orthopedics;;  . TOTAL KNEE REVISION Left 02/08/2016   Procedure: TOTAL KNEE REVISION;  Surgeon: Frederik Pear, MD;  Location: [place];  Service: Orthopedics;  Laterality: Left;     MEDICATIONS: . acetaminophen (TYLENOL) 500 MG tablet  . baclofen (LIORESAL) 10 MG tablet  . cyanocobalamin (,VITAMIN B-12,) 1000 MCG/ML injection  . ELIQUIS 5 MG TABS tablet  . flecainide (TAMBOCOR) 100 MG tablet  . furosemide (LASIX) 40 MG tablet  . HYDROcodone-acetaminophen (NORCO/VICODIN) 5-325 MG tablet  . metoprolol succinate (TOPROL-XL) 25 MG 24 hr tablet  . predniSONE (DELTASONE) 10 MG tablet  . Sennosides-Docusate Sodium (SM STOOL SOFTENER/LAXATIVE PO)  . traMADol (ULTRAM) 50 MG tablet  . vitamin B-12 (CYANOCOBALAMIN) 500 MCG tablet  . vitamin B-12 (CYANOCOBALAMIN) 500 MCG tablet   . cyanocobalamin ((VITAMIN B-12)) injection 1,000 mcg   [facility] Pre-Surgical Testing [phone]

## 2019-10-21 ENCOUNTER — Other Ambulatory Visit: Payer: Self-pay

## 2019-10-21 ENCOUNTER — Ambulatory Visit (INDEPENDENT_AMBULATORY_CARE_PROVIDER_SITE_OTHER): Payer: PRIVATE HEALTH INSURANCE

## 2019-10-21 DIAGNOSIS — E538 Deficiency of other specified B group vitamins: Secondary | ICD-10-CM | POA: Insufficient documentation

## 2019-10-21 DIAGNOSIS — Z20822 Contact with and (suspected) exposure to covid-19: Secondary | ICD-10-CM | POA: Diagnosis not present

## 2019-10-21 MED ORDER — CYANOCOBALAMIN 1000 MCG/ML IJ SOLN: 1000.0000 ug | Freq: Once | INTRAMUSCULAR | Status: DC

## 2019-10-21 MED ORDER — CYANOCOBALAMIN 1000 MCG/ML IJ SOLN
1000.0000 ug | Freq: Once | INTRAMUSCULAR | Status: AC
  Administered 2019-10-21: 1000 ug via INTRAMUSCULAR

## 2019-10-21 NOTE — Progress Notes (Signed)
Per orders of Webb Silversmith, NP, injection of vitamin b12 given right deltoid by Kem Parkinson cma. Patient tolerated injection well.

## 2019-10-25 MED ORDER — BUPIVACAINE LIPOSOME 1.3 % IJ SUSP
20.0000 mL | Freq: Once | INTRAMUSCULAR | Status: DC
  Filled 2019-10-25: qty 20

## 2019-10-25 NOTE — H&P (Signed)
Chief Complaint:  Morbid obesity BMI 47  History of Present Illness:  Richard [name] is an 59 y.o. male who presents for sleeve gastrectomy.  He has a past medical history of DVT and PE and had a temp filter when he had a left knee replaclement.  He is ready for sleeve gastrectomyl.   Past Medical History:  Diagnosis Date  . Arthritis   . Arthrofibrosis of total knee arthroplasty ([place]) 05/30/2015  . Bilateral pulmonary embolism ([place])    a. 03/2015 CTA: acute bilat PE; b. IVC filter placed in 2017--> removed 2018.  Marland Kitchen Chest pain    a. 2015 Abnl MV;  b. 2015 Cath: nl cors.  . Chronic diastolic CHF (congestive heart failure) ([place])    a. 03/2015 Echo: EF 55-65%, poor windows, mildly dil RV.  Marland Kitchen DVT (deep venous thrombosis) ([place])    a. 03/2015 U/S: occlusive DVT w/in the distal aspect of the Left fem vein through the L popliteal vein.  Marland Kitchen Dyspnea    On exertion  . Dysrhythmia    afib   . GERD (gastroesophageal reflux disease)   . Headache   . Hypertension   . Hypertensive heart disease   . Obesity   . OSA (obstructive sleep apnea)    uses CPAP nightly settings at 15   . PAF (paroxysmal atrial fibrillation) ([place])   . Pneumonia    hx of years ago   . Primary localized osteoarthritis of left knee    a. 11/2014 s/p L TKA.  . Primary localized osteoarthritis of right knee    a. 02/2014 s/p R Partial Knee arthroplasty.  . Stiffness of left knee    a. 12/2014 s/p manipulation under anesthesia.  . Urine incontinence     Past Surgical History:  Procedure Laterality Date  . APPENDECTOMY    . CARDIAC CATHETERIZATION    . CARPAL TUNNEL RELEASE Left 03/2018  . EXAM UNDER ANESTHESIA WITH MANIPULATION OF KNEE Left 05/30/2015   Procedure: EXAM UNDER ANESTHESIA WITH MANIPULATION OF LEFT KNEE;  Surgeon: Marchia Bond, MD;  Location: [place];  Service: Orthopedics;  Laterality: Left;  . I & D KNEE WITH POLY EXCHANGE Left 10/30/2015   Procedure: IRRIGATION AND DEBRIDEMENT KNEE WITH POLY EXCHANGE PLACE  SPACERS;  Surgeon: Frederik Pear, MD;  Location: [place];  Service: Orthopedics;  Laterality: Left;  OFF ELIQUIST 3 DAYS  . IVC FILTER PLACEMENT ([facility] HX)  04/13/15  . IVC FILTER REMOVAL N/A 08/13/2016   Procedure: IVC Filter Removal;  Surgeon: Katha Cabal, MD;  Location: [place] CV LAB;  Service: Cardiovascular;  Laterality: N/A;  . JOINT REPLACEMENT    . KNEE ARTHROSCOPY Left 08/08/2015   Procedure: LEFT KNEE MANIPULATION WITH ARTHROSCOPIC LYSIS OF ADHESIONS AND ASPIRATION;  Surgeon: Marchia Bond, MD;  Location: [place];  Service: Orthopedics;  Laterality: Left;  . KNEE CLOSED REDUCTION Left 01/20/2015   Procedure: LEFT KNEE MANIPULATION;  Surgeon: Marchia Bond, MD;  Location: [place];  Service: Orthopedics;  Laterality: Left;  . LEFT HEART CATHETERIZATION WITH CORONARY ANGIOGRAM N/A 01/12/2014   Procedure: LEFT HEART CATHETERIZATION WITH CORONARY ANGIOGRAM;  Surgeon: Jettie Booze, MD;  Location: [facility] CATH LAB;  Service: Cardiovascular;  Laterality: N/A;  . ORTHOPEDIC SURGERY Left    arthroscopy x3  . PARTIAL KNEE ARTHROPLASTY Right 02/25/2014   Procedure: RIGHT KNEE ARTHROPLASTY CONDYLE AND PLATEAU MEDIAL COMPARTMENT ;  Surgeon: Johnny Bridge, MD;  Location: [place];  Service: Orthopedics;  Laterality: Right;  .  PERIPHERAL VASCULAR CATHETERIZATION N/A 03/29/2015   Procedure: IVC Filter Insertion, and possible thrombectomy;  Surgeon: Katha Cabal, MD;  Location: [place] CV LAB;  Service: Cardiovascular;  Laterality: N/A;  . ROTATOR CUFF REPAIR Left   . TOTAL KNEE ARTHROPLASTY  12/06/2014   Procedure: TOTAL KNEE ARTHROPLASTY;  Surgeon: Marchia Bond, MD;  Location: [place];  Service: Orthopedics;;  . TOTAL KNEE REVISION Left 02/08/2016   Procedure: TOTAL KNEE REVISION;  Surgeon: Frederik Pear, MD;  Location: [place];  Service: Orthopedics;  Laterality: Left;    Current Facility-Administered Medications  Medication Dose Route Frequency  Provider Last Rate Last Admin  . cyanocobalamin ((VITAMIN B-12)) injection 1,000 mcg  1,000 mcg Intramuscular Once Jearld Fenton, NP       Current Outpatient Medications  Medication Sig Dispense Refill  . acetaminophen (TYLENOL) 500 MG tablet Take 1,000 mg by mouth every 6 (six) hours as needed for moderate pain.    . baclofen (LIORESAL) 10 MG tablet TAKE 1 TO 2 TABLETS BY MOUTH 3 TIMES DAILY AS NEEDED FOR MUSCLE SPASMS (Patient taking differently: Take 10 mg by mouth 3 (three) times daily as needed for muscle spasms. ) 180 each 0  . cyanocobalamin (,VITAMIN B-12,) 1000 MCG/ML injection Inject 1,000 mcg into the muscle every 30 (thirty) days.    Marland Kitchen ELIQUIS 5 MG TABS tablet TAKE 1 TABLET BY MOUTH 2 TIMES DAILY (Patient taking differently: Take 5 mg by mouth 2 (two) times daily. ) 60 tablet 6  . flecainide (TAMBOCOR) 100 MG tablet TAKE 1 TABLET BY MOUTH TWICE (2) DAILY (Patient taking differently: Take 100 mg by mouth 2 (two) times daily. ) 180 tablet 3  . furosemide (LASIX) 40 MG tablet TAKE 1 TABLET (40 MG TOTAL) BY MOUTH DAILY AS NEEDED. MUST SCHEDULE ANNUAL EXAM (Patient taking differently: Take 40 mg by mouth daily as needed (legs swelling). ) 30 tablet 0  . HYDROcodone-acetaminophen (NORCO/VICODIN) 5-325 MG tablet Take 1 tablet by mouth every 6 (six) hours as needed for moderate pain. (Patient taking differently: Take 1 tablet by mouth every 6 (six) hours as needed for severe pain. ) 15 tablet 0  . metoprolol succinate (TOPROL-XL) 25 MG 24 hr tablet TAKE 1 TABLET BY MOUTH ONCE A DAY WITH OR IMMEDIATELY FOLLOWING A MEAL (Patient taking differently: Take 25 mg by mouth daily. ) 90 tablet 3  . traMADol (ULTRAM) 50 MG tablet TAKE 2 TABLETS BY MOUTH AT BEDTIME AS NEEDED (Patient taking differently: Take 50-100 mg by mouth daily as needed (pain.). ) 60 tablet 0  . vitamin B-12 (CYANOCOBALAMIN) 500 MCG tablet Take 500 mcg by mouth daily.    . predniSONE (DELTASONE) 10 MG tablet Take 1 tablet (10 mg  total) by mouth daily with breakfast. 3 tablet 0  . Sennosides-Docusate Sodium (SM STOOL SOFTENER/LAXATIVE PO) Take 1 tablet by mouth daily as needed (constipation.).     Marland Kitchen vitamin B-12 (CYANOCOBALAMIN) 500 MCG tablet Take 500 mcg by mouth daily.      Penicillins and Vancomycin Family History  Problem Relation Age of Onset  . Coronary artery disease Mother   . Hyperlipidemia Mother   . Hypertension Mother   . Arthritis Mother   . Coronary artery disease Father   . Heart disease Paternal Grandmother   . Alzheimer's disease Paternal Grandfather    Social History:   reports that he has never smoked. He has never used smokeless tobacco. He reports previous alcohol use. He reports that he does not use drugs.  REVIEW OF SYSTEMS : Negative except for see problem list  Physical Exam:   There were no vitals taken for this visit. There is no height or weight on file to calculate BMI.  Gen:  WDWN male NAD  Neurological: Alert and oriented to person, place, and time. Motor and sensory function is grossly intact  Head: Normocephalic and atraumatic.  Eyes: Conjunctivae are normal. Pupils are equal, round, and reactive to light. No scleral icterus.  Neck: Normal range of motion. Neck supple. No tracheal deviation or thyromegaly present.  Cardiovascular:  SR without murmurs or gallops.  No carotid bruits Breast:  Not examined Respiratory: Effort normal.  No respiratory distress. No chest wall tenderness. Breath sounds normal.  No wheezes, rales or rhonchi.  Abdomen:  Prior appendectomy ;  obese GU:  Not examined Musculoskeletal: Normal range of motion. Extremities are nontender. No cyanosis, edema or clubbing noted Lymphadenopathy: No cervical, preauricular, postauricular or axillary adenopathy is present Skin: Skin is warm and dry. No rash noted. No diaphoresis. No erythema. No pallor. Pscyh: Normal mood and affect. Behavior is normal. Judgment and thought content normal.   LABORATORY  RESULTS: No results found for this or any previous visit (from the past 48 hour(s)).   RADIOLOGY RESULTS: No results found.  Problem List: Patient Active Problem List   Diagnosis Date Noted  . B12 deficiency 10/21/2019  . OA (osteoarthritis) 06/23/2018  . History of pulmonary embolus (PE) 06/23/2018  . CHF (congestive heart failure), NYHA class II, chronic, diastolic ([place]) 28/20/8138  . PAF (paroxysmal atrial fibrillation) ([place]) 03/30/2015  . Peroneal DVT (deep venous thrombosis) ([place])   . Hypertension 08/17/2011  . GERD (gastroesophageal reflux disease) 08/17/2011  . OSA (obstructive sleep apnea) 08/16/2011    Assessment & Plan: BMI 47 and morbid obesity for sleeve gastrectomy    Matt B. Hassell Done, MD, [facility] [phone] beeper [phone]  10/25/2019 7:05 AM

## 2019-10-26 ENCOUNTER — Inpatient Hospital Stay (HOSPITAL_COMMUNITY): Payer: PRIVATE HEALTH INSURANCE | Admitting: Physician Assistant

## 2019-10-26 ENCOUNTER — Encounter (HOSPITAL_COMMUNITY): Payer: Self-pay | Admitting: Surgery

## 2019-10-26 ENCOUNTER — Inpatient Hospital Stay (HOSPITAL_COMMUNITY): Payer: PRIVATE HEALTH INSURANCE | Admitting: Anesthesiology

## 2019-10-26 ENCOUNTER — Encounter (HOSPITAL_COMMUNITY)
Admission: RE | Disposition: A | Discharge status: Home / Self Care | Payer: Self-pay | Source: Home / Self Care | Attending: Surgery

## 2019-10-26 ENCOUNTER — Other Ambulatory Visit: Payer: Self-pay

## 2019-10-26 ENCOUNTER — Inpatient Hospital Stay (HOSPITAL_COMMUNITY)
Admission: RE | Admit: 2019-10-26 | Discharge: 2019-10-27 | DRG: 620 | Disposition: A | Discharge status: Home / Self Care | Payer: PRIVATE HEALTH INSURANCE | Attending: Surgery | Admitting: Surgery

## 2019-10-26 DIAGNOSIS — Z86718 Personal history of other venous thrombosis and embolism: Secondary | ICD-10-CM | POA: Diagnosis not present

## 2019-10-26 DIAGNOSIS — Z820 Family history of epilepsy and other diseases of the nervous system: Secondary | ICD-10-CM | POA: Diagnosis not present

## 2019-10-26 DIAGNOSIS — Z8261 Family history of arthritis: Secondary | ICD-10-CM

## 2019-10-26 DIAGNOSIS — Z7952 Long term (current) use of systemic steroids: Secondary | ICD-10-CM | POA: Diagnosis not present

## 2019-10-26 DIAGNOSIS — G4733 Obstructive sleep apnea (adult) (pediatric): Secondary | ICD-10-CM | POA: Diagnosis not present

## 2019-10-26 DIAGNOSIS — Z881 Allergy status to other antibiotic agents status: Secondary | ICD-10-CM | POA: Diagnosis not present

## 2019-10-26 DIAGNOSIS — Z8249 Family history of ischemic heart disease and other diseases of the circulatory system: Secondary | ICD-10-CM | POA: Diagnosis not present

## 2019-10-26 DIAGNOSIS — Z96653 Presence of artificial knee joint, bilateral: Secondary | ICD-10-CM | POA: Diagnosis present

## 2019-10-26 DIAGNOSIS — E538 Deficiency of other specified B group vitamins: Secondary | ICD-10-CM | POA: Diagnosis not present

## 2019-10-26 DIAGNOSIS — I110 Hypertensive heart disease with heart failure: Secondary | ICD-10-CM | POA: Diagnosis present

## 2019-10-26 DIAGNOSIS — Z6841 Body Mass Index (BMI) 40.0 and over, adult: Secondary | ICD-10-CM

## 2019-10-26 DIAGNOSIS — Z83438 Family history of other disorder of lipoprotein metabolism and other lipidemia: Secondary | ICD-10-CM

## 2019-10-26 DIAGNOSIS — I480 Paroxysmal atrial fibrillation: Secondary | ICD-10-CM | POA: Diagnosis not present

## 2019-10-26 DIAGNOSIS — K449 Diaphragmatic hernia without obstruction or gangrene: Secondary | ICD-10-CM | POA: Diagnosis not present

## 2019-10-26 DIAGNOSIS — I10 Essential (primary) hypertension: Secondary | ICD-10-CM | POA: Diagnosis not present

## 2019-10-26 DIAGNOSIS — K219 Gastro-esophageal reflux disease without esophagitis: Secondary | ICD-10-CM | POA: Diagnosis present

## 2019-10-26 DIAGNOSIS — Z880 Allergy status to penicillin: Secondary | ICD-10-CM | POA: Diagnosis not present

## 2019-10-26 DIAGNOSIS — Z79899 Other long term (current) drug therapy: Secondary | ICD-10-CM | POA: Diagnosis not present

## 2019-10-26 DIAGNOSIS — I5032 Chronic diastolic (congestive) heart failure: Secondary | ICD-10-CM | POA: Diagnosis not present

## 2019-10-26 DIAGNOSIS — Z9884 Bariatric surgery status: Secondary | ICD-10-CM

## 2019-10-26 DIAGNOSIS — Z86711 Personal history of pulmonary embolism: Secondary | ICD-10-CM

## 2019-10-26 DIAGNOSIS — Z7901 Long term (current) use of anticoagulants: Secondary | ICD-10-CM | POA: Diagnosis not present

## 2019-10-26 HISTORY — PX: LAPAROSCOPIC GASTRIC SLEEVE RESECTION: SHX5895

## 2019-10-26 HISTORY — PX: HIATAL HERNIA REPAIR: SHX195

## 2019-10-26 HISTORY — PX: UPPER GI ENDOSCOPY: SHX6162

## 2019-10-26 LAB — CBC
HCT: 47.2 % (ref 39.0–52.0)
Hemoglobin: 15.4 g/dL (ref 13.0–17.0)
MCH: 30.3 pg (ref 26.0–34.0)
MCHC: 32.6 g/dL (ref 30.0–36.0)
MCV: 92.7 fL (ref 80.0–100.0)
Platelets: 182 10*3/uL (ref 150–400)
RBC: 5.09 MIL/uL (ref 4.22–5.81)
RDW: 13.5 % (ref 11.5–15.5)
WBC: 11.3 10*3/uL — ABNORMAL HIGH (ref 4.0–10.5)
nRBC: 0 % (ref 0.0–0.2)

## 2019-10-26 LAB — CREATININE, SERUM
Creatinine, Ser: 1.27 mg/dL — ABNORMAL HIGH (ref 0.61–1.24)
GFR calc non Af Amer: >60 mL/min (ref >60)

## 2019-10-26 LAB — TYPE AND SCREEN
ABO/RH(D): A POS
Antibody Screen: NEGATIVE

## 2019-10-26 LAB — SURGICAL PATHOLOGY

## 2019-10-26 SURGERY — GASTRECTOMY, SLEEVE, LAPAROSCOPIC
Anesthesia: General

## 2019-10-26 MED ORDER — SUCCINYLCHOLINE CHLORIDE 200 MG/10ML IV SOSY
PREFILLED_SYRINGE | INTRAVENOUS | Status: DC | PRN
  Administered 2019-10-26: 160 mg via INTRAVENOUS

## 2019-10-26 MED ORDER — HYDROMORPHONE HCL 1 MG/ML IJ SOLN
INTRAMUSCULAR | Status: AC
Start: 2019-10-26 — End: 2019-10-26
  Filled 2019-10-26: qty 1

## 2019-10-26 MED ORDER — BUPIVACAINE LIPOSOME 1.3 % IJ SUSP
INTRAMUSCULAR | Status: DC | PRN
  Administered 2019-10-26: 20 mL

## 2019-10-26 MED ORDER — ROCURONIUM BROMIDE 10 MG/ML (PF) SYRINGE
PREFILLED_SYRINGE | INTRAVENOUS | Status: AC
  Filled 2019-10-26: qty 10

## 2019-10-26 MED ORDER — ACETAMINOPHEN 500 MG PO TABS
1000.0000 mg | ORAL_TABLET | Freq: Three times a day (TID) | ORAL | Status: DC
  Administered 2019-10-26 – 2019-10-27 (×3): 1000 mg via ORAL
  Filled 2019-10-26 (×3): qty 2

## 2019-10-26 MED ORDER — KETAMINE HCL 10 MG/ML IJ SOLN
INTRAMUSCULAR | Status: DC | PRN
  Administered 2019-10-26: 35 mg via INTRAVENOUS

## 2019-10-26 MED ORDER — ACETAMINOPHEN 500 MG PO TABS
1000.0000 mg | ORAL_TABLET | ORAL | Status: AC
  Administered 2019-10-26: 1000 mg via ORAL
  Filled 2019-10-26: qty 2

## 2019-10-26 MED ORDER — OXYCODONE HCL 5 MG PO TABS: 5.0000 mg | ORAL_TABLET | Freq: Once | ORAL | Status: DC | PRN

## 2019-10-26 MED ORDER — ONDANSETRON HCL 4 MG/2ML IJ SOLN
INTRAMUSCULAR | Status: AC
  Filled 2019-10-26: qty 2

## 2019-10-26 MED ORDER — LIDOCAINE 20MG/ML (2%) 15 ML SYRINGE OPTIME
INTRAMUSCULAR | Status: DC | PRN
  Administered 2019-10-26: 1.5 mg/kg/h via INTRAVENOUS

## 2019-10-26 MED ORDER — FENTANYL CITRATE (PF) 250 MCG/5ML IJ SOLN
INTRAMUSCULAR | Status: DC | PRN
Start: 2019-10-26 — End: 2019-10-26
  Administered 2019-10-26: 100 ug via INTRAVENOUS

## 2019-10-26 MED ORDER — ONDANSETRON HCL 4 MG/2ML IJ SOLN: 4.0000 mg | Freq: Once | INTRAMUSCULAR | Status: DC | PRN

## 2019-10-26 MED ORDER — ROCURONIUM BROMIDE 10 MG/ML (PF) SYRINGE
PREFILLED_SYRINGE | INTRAVENOUS | Status: DC | PRN
  Administered 2019-10-26: 50 mg via INTRAVENOUS
  Administered 2019-10-26: 10 mg via INTRAVENOUS

## 2019-10-26 MED ORDER — SUGAMMADEX SODIUM 200 MG/2ML IV SOLN
INTRAVENOUS | Status: DC | PRN
  Administered 2019-10-26: 300 mg via INTRAVENOUS

## 2019-10-26 MED ORDER — MIDAZOLAM HCL 2 MG/2ML IJ SOLN
INTRAMUSCULAR | Status: AC
  Filled 2019-10-26: qty 2

## 2019-10-26 MED ORDER — DEXAMETHASONE SODIUM PHOSPHATE 10 MG/ML IJ SOLN
INTRAMUSCULAR | Status: AC
  Filled 2019-10-26: qty 1

## 2019-10-26 MED ORDER — GLYCOPYRROLATE PF 0.2 MG/ML IJ SOSY
PREFILLED_SYRINGE | INTRAMUSCULAR | Status: AC
  Filled 2019-10-26: qty 1

## 2019-10-26 MED ORDER — LEVOFLOXACIN IN D5W 750 MG/150ML IV SOLN
750.0000 mg | INTRAVENOUS | Status: AC
  Administered 2019-10-26: 750 mg via INTRAVENOUS
  Filled 2019-10-26: qty 150

## 2019-10-26 MED ORDER — KETOROLAC TROMETHAMINE 30 MG/ML IJ SOLN
INTRAMUSCULAR | Status: AC
  Filled 2019-10-26: qty 1

## 2019-10-26 MED ORDER — ONDANSETRON HCL 4 MG/2ML IJ SOLN
4.0000 mg | INTRAMUSCULAR | Status: DC | PRN
  Administered 2019-10-26 – 2019-10-27 (×2): 4 mg via INTRAVENOUS
  Filled 2019-10-26 (×2): qty 2

## 2019-10-26 MED ORDER — FENTANYL CITRATE (PF) 100 MCG/2ML IJ SOLN
INTRAMUSCULAR | Status: AC
  Filled 2019-10-26: qty 4

## 2019-10-26 MED ORDER — SUCCINYLCHOLINE CHLORIDE 200 MG/10ML IV SOSY
PREFILLED_SYRINGE | INTRAVENOUS | Status: AC
  Filled 2019-10-26: qty 10

## 2019-10-26 MED ORDER — ORAL CARE MOUTH RINSE: 15.0000 mL | Freq: Once | OROMUCOSAL | Status: AC

## 2019-10-26 MED ORDER — ENSURE MAX PROTEIN PO LIQD
2.0000 [oz_av] | ORAL | Status: DC
  Filled 2019-10-26 (×8): qty 330

## 2019-10-26 MED ORDER — MIDAZOLAM HCL 5 MG/5ML IJ SOLN
INTRAMUSCULAR | Status: DC | PRN
  Administered 2019-10-26: 2 mg via INTRAVENOUS

## 2019-10-26 MED ORDER — ONDANSETRON HCL 4 MG/2ML IJ SOLN
INTRAMUSCULAR | Status: DC | PRN
  Administered 2019-10-26: 4 mg via INTRAVENOUS

## 2019-10-26 MED ORDER — GABAPENTIN 300 MG PO CAPS
300.0000 mg | ORAL_CAPSULE | ORAL | Status: AC
  Administered 2019-10-26: 300 mg via ORAL
  Filled 2019-10-26: qty 1

## 2019-10-26 MED ORDER — FENTANYL CITRATE (PF) 250 MCG/5ML IJ SOLN
INTRAMUSCULAR | Status: AC
  Filled 2019-10-26: qty 5

## 2019-10-26 MED ORDER — ACETAMINOPHEN 160 MG/5ML PO SOLN
1000.0000 mg | Freq: Three times a day (TID) | ORAL | Status: DC
  Administered 2019-10-26: 1000 mg via ORAL
  Filled 2019-10-26: qty 40.6

## 2019-10-26 MED ORDER — MORPHINE SULFATE (PF) 4 MG/ML IV SOLN
1.0000 mg | INTRAVENOUS | Status: DC | PRN
  Administered 2019-10-26 – 2019-10-27 (×3): 2 mg via INTRAVENOUS
  Filled 2019-10-26 (×3): qty 1

## 2019-10-26 MED ORDER — HYDROMORPHONE HCL 1 MG/ML IJ SOLN
0.2500 mg | INTRAMUSCULAR | Status: DC | PRN
  Administered 2019-10-26 (×2): 0.5 mg via INTRAVENOUS

## 2019-10-26 MED ORDER — LIDOCAINE 2% (20 MG/ML) 5 ML SYRINGE: INTRAMUSCULAR | Status: DC | PRN

## 2019-10-26 MED ORDER — CHLORHEXIDINE GLUCONATE CLOTH 2 % EX PADS: 6.0000 | MEDICATED_PAD | Freq: Once | CUTANEOUS | Status: DC

## 2019-10-26 MED ORDER — LACTATED RINGERS IR SOLN
Status: DC | PRN
  Administered 2019-10-26: 1000 mL

## 2019-10-26 MED ORDER — OXYCODONE HCL 5 MG/5ML PO SOLN: 5.0000 mg | Freq: Once | ORAL | Status: DC | PRN

## 2019-10-26 MED ORDER — SODIUM CHLORIDE (PF) 0.9 % IJ SOLN
INTRAMUSCULAR | Status: AC
  Filled 2019-10-26: qty 10

## 2019-10-26 MED ORDER — LIDOCAINE 2% (20 MG/ML) 5 ML SYRINGE
INTRAMUSCULAR | Status: AC
  Filled 2019-10-26: qty 5

## 2019-10-26 MED ORDER — PANTOPRAZOLE SODIUM 40 MG IV SOLR
40.0000 mg | Freq: Every day | INTRAVENOUS | Status: DC
  Administered 2019-10-26: 40 mg via INTRAVENOUS
  Filled 2019-10-26: qty 40

## 2019-10-26 MED ORDER — 0.9 % SODIUM CHLORIDE (POUR BTL) OPTIME
TOPICAL | Status: DC | PRN
  Administered 2019-10-26: 1000 mL

## 2019-10-26 MED ORDER — OXYCODONE HCL 5 MG/5ML PO SOLN
5.0000 mg | Freq: Four times a day (QID) | ORAL | Status: DC | PRN
  Administered 2019-10-26 – 2019-10-27 (×3): 5 mg via ORAL
  Filled 2019-10-26 (×4): qty 5

## 2019-10-26 MED ORDER — APREPITANT 40 MG PO CAPS
40.0000 mg | ORAL_CAPSULE | ORAL | Status: AC
  Administered 2019-10-26: 40 mg via ORAL
  Filled 2019-10-26: qty 1

## 2019-10-26 MED ORDER — SODIUM CHLORIDE (PF) 0.9 % IJ SOLN
INTRAMUSCULAR | Status: DC | PRN
  Administered 2019-10-26: 10 mL

## 2019-10-26 MED ORDER — ENOXAPARIN (LOVENOX) PATIENT EDUCATION KIT
PACK | Freq: Once | Status: AC
  Filled 2019-10-26: qty 1

## 2019-10-26 MED ORDER — KCL IN DEXTROSE-NACL 20-5-0.45 MEQ/L-%-% IV SOLN
INTRAVENOUS | Status: DC
  Filled 2019-10-26 (×5): qty 1000

## 2019-10-26 MED ORDER — SUGAMMADEX SODIUM 500 MG/5ML IV SOLN
INTRAVENOUS | Status: AC
  Filled 2019-10-26: qty 5

## 2019-10-26 MED ORDER — HEPARIN SODIUM (PORCINE) 5000 UNIT/ML IJ SOLN
5000.0000 [IU] | Freq: Three times a day (TID) | INTRAMUSCULAR | Status: DC
  Administered 2019-10-26 – 2019-10-27 (×4): 5000 [IU] via SUBCUTANEOUS
  Filled 2019-10-26 (×4): qty 1

## 2019-10-26 MED ORDER — METOPROLOL TARTRATE 5 MG/5ML IV SOLN: 5.0000 mg | Freq: Four times a day (QID) | INTRAVENOUS | Status: DC | PRN

## 2019-10-26 MED ORDER — KETAMINE HCL 10 MG/ML IJ SOLN
INTRAMUSCULAR | Status: AC
  Filled 2019-10-26: qty 1

## 2019-10-26 MED ORDER — PHENYLEPHRINE HCL-NACL 10-0.9 MG/250ML-% IV SOLN
INTRAVENOUS | Status: AC
  Filled 2019-10-26: qty 250

## 2019-10-26 MED ORDER — LACTATED RINGERS IV SOLN: INTRAVENOUS | Status: DC

## 2019-10-26 MED ORDER — DEXAMETHASONE SODIUM PHOSPHATE 10 MG/ML IJ SOLN
INTRAMUSCULAR | Status: DC | PRN
  Administered 2019-10-26: 8 mg via INTRAVENOUS

## 2019-10-26 MED ORDER — EPHEDRINE SULFATE 50 MG/ML IJ SOLN
INTRAMUSCULAR | Status: DC | PRN
  Administered 2019-10-26 (×2): 10 mg via INTRAVENOUS

## 2019-10-26 MED ORDER — PROPOFOL 10 MG/ML IV BOLUS
INTRAVENOUS | Status: DC | PRN
  Administered 2019-10-26: 200 mg via INTRAVENOUS

## 2019-10-26 MED ORDER — HEPARIN SODIUM (PORCINE) 5000 UNIT/ML IJ SOLN
5000.0000 [IU] | INTRAMUSCULAR | Status: AC
  Administered 2019-10-26: 5000 [IU] via SUBCUTANEOUS
  Filled 2019-10-26: qty 1

## 2019-10-26 MED ORDER — FENTANYL CITRATE (PF) 100 MCG/2ML IJ SOLN
25.0000 ug | INTRAMUSCULAR | Status: DC | PRN
  Administered 2019-10-26 (×2): 50 ug via INTRAVENOUS

## 2019-10-26 MED ORDER — KETOROLAC TROMETHAMINE 30 MG/ML IJ SOLN
30.0000 mg | Freq: Once | INTRAMUSCULAR | Status: AC | PRN
  Administered 2019-10-26: 30 mg via INTRAVENOUS

## 2019-10-26 MED ORDER — LIDOCAINE 20MG/ML (2%) 15 ML SYRINGE OPTIME: INTRAMUSCULAR | Status: DC | PRN

## 2019-10-26 MED ORDER — CHLORHEXIDINE GLUCONATE 0.12 % MT SOLN
15.0000 mL | Freq: Once | OROMUCOSAL | Status: AC
  Administered 2019-10-26: 15 mL via OROMUCOSAL

## 2019-10-26 MED ORDER — PROPOFOL 10 MG/ML IV BOLUS
INTRAVENOUS | Status: AC
  Filled 2019-10-26: qty 40

## 2019-10-26 SURGICAL SUPPLY — 58 items
APPLICATOR COTTON TIP 6 STRL (MISCELLANEOUS) ×1 IMPLANT
APPLICATOR COTTON TIP 6IN STRL (MISCELLANEOUS) ×2
APPLIER CLIP 5 13 M/L LIGAMAX5 (MISCELLANEOUS)
APPLIER CLIP ROT 10 11.4 M/L (STAPLE)
APPLIER CLIP ROT 13.4 12 LRG (CLIP)
BLADE SURG 15 STRL LF DISP TIS (BLADE) ×1 IMPLANT
BLADE SURG 15 STRL SS (BLADE) ×1
CABLE HIGH FREQUENCY MONO STRZ (ELECTRODE) IMPLANT
CLIP APPLIE 5 13 M/L LIGAMAX5 (MISCELLANEOUS) IMPLANT
CLIP APPLIE ROT 10 11.4 M/L (STAPLE) IMPLANT
CLIP APPLIE ROT 13.4 12 LRG (CLIP) IMPLANT
COVER WAND RF STERILE (DRAPES) IMPLANT
DECANTER SPIKE VIAL GLASS SM (MISCELLANEOUS) ×2 IMPLANT
DERMABOND ADVANCED (GAUZE/BANDAGES/DRESSINGS) ×2
DERMABOND ADVANCED .7 DNX12 (GAUZE/BANDAGES/DRESSINGS) ×2 IMPLANT
DEVICE SUT QUICK LOAD TK 5 (STAPLE) ×4 IMPLANT
DEVICE SUT TI-KNOT TK 5X26 (MISCELLANEOUS) ×2 IMPLANT
DEVICE SUTURE ENDOST 10MM (ENDOMECHANICALS) ×2 IMPLANT
DISSECTOR BLUNT TIP ENDO 5MM (MISCELLANEOUS) ×2 IMPLANT
ELECT REM PT RETURN 15FT ADLT (MISCELLANEOUS) ×2 IMPLANT
GLOVE BIOGEL M 8.0 STRL (GLOVE) ×2 IMPLANT
GOWN STRL REUS W/TWL XL LVL3 (GOWN DISPOSABLE) ×8 IMPLANT
GRASPER SUT TROCAR 14GX15 (MISCELLANEOUS) ×2 IMPLANT
HANDLE STAPLE EGIA 4 XL (STAPLE) ×2 IMPLANT
HOVERMATT SINGLE USE (MISCELLANEOUS) ×2 IMPLANT
KIT BASIN OR (CUSTOM PROCEDURE TRAY) ×2 IMPLANT
KIT TURNOVER KIT A (KITS) IMPLANT
MARKER SKIN DUAL TIP RULER LAB (MISCELLANEOUS) ×2 IMPLANT
MAT PREVALON FULL STRYKER (MISCELLANEOUS) ×2 IMPLANT
NEEDLE SPNL 22GX3.5 QUINCKE BK (NEEDLE) ×2 IMPLANT
PACK CARDIOVASCULAR III (CUSTOM PROCEDURE TRAY) ×2 IMPLANT
RELOAD TRI 45 ART MED THCK BLK (STAPLE) ×2 IMPLANT
RELOAD TRI 45 ART MED THCK PUR (STAPLE) ×2 IMPLANT
RELOAD TRI 60 ART MED THCK BLK (STAPLE) ×2 IMPLANT
RELOAD TRI 60 ART MED THCK PUR (STAPLE) ×6 IMPLANT
SCISSORS LAP 5X45 EPIX DISP (ENDOMECHANICALS) IMPLANT
SET IRRIG TUBING LAPAROSCOPIC (IRRIGATION / IRRIGATOR) ×2 IMPLANT
SET TUBE SMOKE EVAC HIGH FLOW (TUBING) ×2 IMPLANT
SHEARS HARMONIC ACE PLUS 45CM (MISCELLANEOUS) ×2 IMPLANT
SLEEVE ADV FIXATION 5X100MM (TROCAR) ×4 IMPLANT
SLEEVE GASTRECTOMY 36FR VISIGI (MISCELLANEOUS) ×2 IMPLANT
SOL ANTI FOG 6CC (MISCELLANEOUS) ×1 IMPLANT
SOLUTION ANTI FOG 6CC (MISCELLANEOUS) ×1
SPONGE LAP 18X18 RF (DISPOSABLE) ×2 IMPLANT
STAPLER VISISTAT 35W (STAPLE) IMPLANT
SUT MNCRL AB 4-0 PS2 18 (SUTURE) ×4 IMPLANT
SUT SURGIDAC NAB ES-9 0 48 120 (SUTURE) ×4 IMPLANT
SUT VICRYL 0 TIES 12 18 (SUTURE) ×2 IMPLANT
SYR 10ML ECCENTRIC (SYRINGE) ×2 IMPLANT
SYR 20ML LL LF (SYRINGE) ×2 IMPLANT
TOWEL OR 17X26 10 PK STRL BLUE (TOWEL DISPOSABLE) ×2 IMPLANT
TOWEL OR NON WOVEN STRL DISP B (DISPOSABLE) ×2 IMPLANT
TROCAR ADV FIXATION 5X100MM (TROCAR) ×2 IMPLANT
TROCAR BLADELESS 15MM (ENDOMECHANICALS) ×2 IMPLANT
TROCAR BLADELESS OPT 5 100 (ENDOMECHANICALS) ×2 IMPLANT
TUBE CALIBRATION LAPBAND (TUBING) ×2 IMPLANT
TUBING CONNECTING 10 (TUBING) ×2 IMPLANT
TUBING ENDO SMARTCAP (MISCELLANEOUS) ×2 IMPLANT

## 2019-10-26 NOTE — Anesthesia Procedure Notes (Addendum)
Procedure Name: Intubation Date/Time: 10/26/2019 7:32 AM Performed by: Talbot Grumbling, CRNA Pre-anesthesia Checklist: Patient identified Patient Re-evaluated:Patient Re-evaluated prior to induction Oxygen Delivery Method: Circle system utilized Preoxygenation: Pre-oxygenation with 100% oxygen Induction Type: IV induction, Rapid sequence and Cricoid Pressure applied Laryngoscope Size: Mac and 4 Grade View: Grade I Tube type: Oral Tube size: 8.0 mm Number of attempts: 1 Airway Equipment and Method: Stylet Placement Confirmation: ETT inserted through vocal cords under direct vision,  positive ETCO2 and breath sounds checked- equal and bilateral Secured at: 23 cm Tube secured with: Tape Dental Injury: Teeth and Oropharynx as per pre-operative assessment  Comments: Intubation performed by Jacqualine Code, no issues

## 2019-10-26 NOTE — Progress Notes (Signed)
Discussed post op day goals with patient including ambulation, IS, diet progression, pain, and nausea control.  BSTOP education provided including BSTOP information guide, "Guide for Pain Management after your Bariatric Procedure".  Questions answered. 

## 2019-10-26 NOTE — Op Note (Signed)
26 October 2019  Surgeon: Kaylyn Lim, MD, FACS  Asst:  Gurney Maxin, MD, FACS  Anes:  General endotracheal  Procedure: Laparoscopic repair of hiatal hernia (2 sutures) with  sleeve gastrectomy (35 VisiGi) and upper endoscopy  Diagnosis: Morbid obesity  Complications: None noted  EBL:   minimal cc  Description of Procedure:  The patient was take to OR 1 and given general anesthesia.  The abdomen was prepped with Technicare and draped sterilely.  A timeout was performed.  Access to the abdomen was achieved with a 5 mm Optiview through the left upper quadrant.  Following insufflation, the state of the abdomen was found to be free of adhesions.  The calibration tubing was inserted and despite no visible dimple there was a positive test with the balloon with 15 cc of air slide partially up into the chest.  The posterior crura were dissected and two sutures were placed and secured with Ty Knots.  The balloon catheter was passed and this time the test was negative.   The ViSiGi 36Fr tube was inserted to deflate the stomach and was pulled back into the esophagus.    The pylorus was identified and we measured 5 cm back and marked the antrum.  At that point we began dissection to take down the greater curvature of the stomach using the Harmonic scalpel.  This dissection was taken all the way up to the left crus.  Posterior attachments of the stomach were also taken down.    The ViSiGi tube was then passed into the antrum and suction applied so that it was snug along the lessor curvature.  The "crow's foot" or incisura was identified.  The sleeve gastrectomy was begun using the [employer] stapler beginning with a 4.5 cm black load with TRS, 6 cm black with TRS and then a series of purple loads with TRS.  When the sleeve was complete the tube was taken off suction and insufflated briefly.  The tube was withdrawn.  Upper endoscopy was then performed by Dr. Kieth Brightly.     The specimen was  extracted through the 15 trocar site.  Local was provided by infiltrating with a TAP block of 20 cc diluted to 30 cc and placed bilaterally then the skin was  Closed with  4-0 Monocryl and Dermabond.    Richard [name] Done, [place], [facility], [place]

## 2019-10-26 NOTE — Transfer of Care (Signed)
Immediate Anesthesia Transfer of Care Note  Patient: Richard [name]  Procedure(s) Performed: LAPAROSCOPIC SLEEVE GASTRECTOMY (N/A ) UPPER GI ENDOSCOPY (N/A ) HIATAL HERNIA REPAIR (N/A )  Patient Location: PACU  Anesthesia Type:General  Level of Consciousness: sedated  Airway & Oxygen Therapy: Patient Spontanous Breathing and Patient connected to face mask oxygen  Post-op Assessment: Report given to RN and Post -op Vital signs reviewed and stable  Post vital signs: Reviewed and stable  Last Vitals:  Vitals Value Taken Time  BP    Temp    Pulse    Resp    SpO2      Last Pain:  Vitals:   10/26/19 0559  TempSrc:   PainSc: 4          Complications: No complications documented.

## 2019-10-26 NOTE — Progress Notes (Signed)
PHARMACY CONSULT FOR:  Risk Assessment for Post-Discharge VTE Following Bariatric Surgery  Post-Discharge VTE Risk Assessment: This patient's probability of 30-day post-discharge VTE is increased due to the factors marked: x  Male    Age >/=60 years    BMI >/=50 kg/m2    CHF    Dyspnea at Rest    Paraplegia  x  Non-gastric-band surgery    Operation Time >/=3 hr    Return to OR     Length of Stay >/= 3 d  x Hx of VTE   Hypercoagulable condition   Significant venous stasis       Predicted probability of 30-day post-discharge VTE: n/a due to hx VTE on apixaban PTA  Other patient-specific factors to consider:   Recommendation for Discharge:  Resume full-dose anticoagulation as soon as medically safe per surgery MD assessment.    Given limited published data on reliability of DOAC absorption in this setting, will await MD decision on whether to use enoxaparin vs resume DOAC.     Richard [name] is a 59 y.o. male who underwent  laparoscopic sleeve gastrectomy on 10/26/19   Case start: 0752 Case end: 0925   Allergies  Allergen Reactions  . Penicillins Anaphylaxis and Other (See Comments)    From childhood; uncertain of reaction Has patient had a PCN reaction causing immediate rash, facial/tongue/throat swelling, SOB or lightheadedness with hypotension: Unk Has patient had a PCN reaction causing severe rash involving mucus membranes or skin necrosis: Unk Has patient had a PCN reaction that required hospitalization: Unk Has patient had a PCN reaction occurring within the last 10 years: No If all of the above answers are "NO", then may proceed with Cephalosporin use.  . Vancomycin Other (See Comments)    Red Mans' syndrome    Patient Measurements: Weight: (!) 136.4 kg (300 lb 9.6 oz) Body mass index is 44.39 kg/m.  No results for input(s): WBC, HGB, HCT, PLT, APTT, CREATININE, LABCREA, CREATININE, CREAT24HRUR, MG, PHOS, ALBUMIN, PROT, ALBUMIN, AST, ALT, ALKPHOS,  BILITOT, BILIDIR, IBILI in the last 72 hours. Estimated Creatinine Clearance: 87.7 mL/min (A) (by C-G formula based on SCr of 1.26 mg/dL (H)).    Past Medical History:  Diagnosis Date  . Arthritis   . Arthrofibrosis of total knee arthroplasty ([place]) 05/30/2015  . Bilateral pulmonary embolism ([place])    a. 03/2015 CTA: acute bilat PE; b. IVC filter placed in 2017--> removed 2018.  Marland Kitchen Chest pain    a. 2015 Abnl MV;  b. 2015 Cath: nl cors.  . Chronic diastolic CHF (congestive heart failure) ([place])    a. 03/2015 Echo: EF 55-65%, poor windows, mildly dil RV.  Marland Kitchen DVT (deep venous thrombosis) ([place])    a. 03/2015 U/S: occlusive DVT w/in the distal aspect of the Left fem vein through the L popliteal vein.  Marland Kitchen Dyspnea    On exertion  . Dysrhythmia    afib   . GERD (gastroesophageal reflux disease)   . Headache   . Hypertension   . Hypertensive heart disease   . Obesity   . OSA (obstructive sleep apnea)    uses CPAP nightly settings at 15   . PAF (paroxysmal atrial fibrillation) ([place])   . Pneumonia    hx of years ago   . Primary localized osteoarthritis of left knee    a. 11/2014 s/p L TKA.  . Primary localized osteoarthritis of right knee    a. 02/2014 s/p R Partial Knee arthroplasty.  . Stiffness of left knee  a. 12/2014 s/p manipulation under anesthesia.  . Urine incontinence      Facility-Administered Medications Prior to Admission  Medication Dose Route Frequency Provider Last Rate Last Admin  . cyanocobalamin ((VITAMIN B-12)) injection 1,000 mcg  1,000 mcg Intramuscular Once Jearld Fenton, NP       Medications Prior to Admission  Medication Sig Dispense Refill Last Dose  . acetaminophen (TYLENOL) 500 MG tablet Take 1,000 mg by mouth every 6 (six) hours as needed for moderate pain.   Past Week at Unknown time  . baclofen (LIORESAL) 10 MG tablet TAKE 1 TO 2 TABLETS BY MOUTH 3 TIMES DAILY AS NEEDED FOR MUSCLE SPASMS (Patient taking differently: Take 10 mg by mouth 3 (three) times daily  as needed for muscle spasms. ) 180 each 0 10/26/2019 at Unknown time  . cyanocobalamin (,VITAMIN B-12,) 1000 MCG/ML injection Inject 1,000 mcg into the muscle every 30 (thirty) days.   Past Week at Unknown time  . ELIQUIS 5 MG TABS tablet TAKE 1 TABLET BY MOUTH 2 TIMES DAILY (Patient taking differently: Take 5 mg by mouth 2 (two) times daily. ) 60 tablet 6 10/23/2019 at 2100  . flecainide (TAMBOCOR) 100 MG tablet TAKE 1 TABLET BY MOUTH TWICE (2) DAILY (Patient taking differently: Take 100 mg by mouth 2 (two) times daily. ) 180 tablet 3 10/26/2019 at Unknown time  . metoprolol succinate (TOPROL-XL) 25 MG 24 hr tablet TAKE 1 TABLET BY MOUTH ONCE A DAY WITH OR IMMEDIATELY FOLLOWING A MEAL (Patient taking differently: Take 25 mg by mouth daily. ) 90 tablet 3 10/26/2019 at Unknown time  . predniSONE (DELTASONE) 10 MG tablet Take 1 tablet (10 mg total) by mouth daily with breakfast. 3 tablet 0 Past Week at Unknown time  . traMADol (ULTRAM) 50 MG tablet TAKE 2 TABLETS BY MOUTH AT BEDTIME AS NEEDED (Patient taking differently: Take 50-100 mg by mouth daily as needed (pain.). ) 60 tablet 0 Past Week at Unknown time  . vitamin B-12 (CYANOCOBALAMIN) 500 MCG tablet Take 500 mcg by mouth daily.   Past Week at Unknown time  . furosemide (LASIX) 40 MG tablet TAKE 1 TABLET (40 MG TOTAL) BY MOUTH DAILY AS NEEDED. MUST SCHEDULE ANNUAL EXAM (Patient taking differently: Take 40 mg by mouth daily as needed (legs swelling). ) 30 tablet 0 More than a month at Unknown time  . HYDROcodone-acetaminophen (NORCO/VICODIN) 5-325 MG tablet Take 1 tablet by mouth every 6 (six) hours as needed for moderate pain. (Patient taking differently: Take 1 tablet by mouth every 6 (six) hours as needed for severe pain. ) 15 tablet 0 More than a month at Unknown time  . Sennosides-Docusate Sodium (SM STOOL SOFTENER/LAXATIVE PO) Take 1 tablet by mouth daily as needed (constipation.).    Not Taking at Unknown time  . vitamin B-12 (CYANOCOBALAMIN) 500 MCG  tablet Take 500 mcg by mouth daily.    Not Taking at Unknown time       Kara Mead 10/26/2019,11:06 AM

## 2019-10-26 NOTE — Anesthesia Postprocedure Evaluation (Signed)
Anesthesia Post Note  Patient: Richard [name]  Procedure(s) Performed: LAPAROSCOPIC SLEEVE GASTRECTOMY (N/A ) UPPER GI ENDOSCOPY (N/A ) HIATAL HERNIA REPAIR (N/A )     Patient location during evaluation: PACU Anesthesia Type: General Level of consciousness: awake and alert Pain management: pain level controlled Vital Signs Assessment: post-procedure vital signs reviewed and stable Respiratory status: spontaneous breathing, nonlabored ventilation, respiratory function stable and patient connected to nasal cannula oxygen Cardiovascular status: blood pressure returned to baseline and stable Postop Assessment: no apparent nausea or vomiting Anesthetic complications: no   No complications documented.  Last Vitals:  Vitals:   10/26/19 1138 10/26/19 1241  BP: (!) 148/73 137/84  Pulse: (!) 56 (!) 53  Resp: 18 18  Temp: 36.4 C   SpO2: 93% 98%    Last Pain:  Vitals:   10/26/19 1138  TempSrc: Oral  PainSc: 9                  Breyon Sigg S

## 2019-10-26 NOTE — Interval H&P Note (Signed)
History and Physical Interval Note:  10/26/2019 7:15 AM  Richard [name]  has presented today for surgery, with the diagnosis of morbid obesity.  The various methods of treatment have been discussed with the patient and family. After consideration of risks, benefits and other options for treatment, the patient has consented to  Procedure(s): LAPAROSCOPIC SLEEVE GASTRECTOMY (N/A) UPPER GI ENDOSCOPY (N/A) HIATAL HERNIA REPAIR (N/A) as a surgical intervention.  The patient's history has been reviewed, patient examined, no change in status, stable for surgery.  I have reviewed the patient's chart and labs.  Questions were answered to the patient's satisfaction.     Pedro Earls

## 2019-10-26 NOTE — Op Note (Signed)
Preoperative diagnosis: laparoscopic sleeve gastrectomy  Postoperative diagnosis: Same   Procedure: Upper endoscopy   Surgeon: Luke Kinsinger, M.D.  Anesthesia: Gen.   Indications for procedure: This patient was undergoing a laparoscopic sleeve gastrectomy.   Description of procedure: The endoscopy was placed in the mouth and into the oropharynx and under endoscopic vision it was advanced to the esophagogastric junction. The pouch was insufflated and no bleeding or bubbles were seen. The GEJ was identified at 40cm from the teeth.  No bleeding or leaks were detected. The scope was withdrawn without difficulty.   Luke Kinsinger, M.D. General, Bariatric, & Minimally Invasive Surgery [facility]    

## 2019-10-27 ENCOUNTER — Encounter (HOSPITAL_COMMUNITY): Payer: Self-pay | Admitting: Surgery

## 2019-10-27 ENCOUNTER — Other Ambulatory Visit (HOSPITAL_COMMUNITY): Payer: Self-pay | Admitting: Surgery

## 2019-10-27 LAB — CBC WITH DIFFERENTIAL/PLATELET
Abs Immature Granulocytes: 0.09 10*3/uL — ABNORMAL HIGH (ref 0.00–0.07)
Abs Immature Granulocytes: 0.07 10*3/uL (ref 0.00–0.07)
Basophils Absolute: 0 10*3/uL (ref 0.0–0.1)
Basophils Absolute: 0 10*3/uL (ref 0.0–0.1)
Basophils Relative: 0 %
Basophils Relative: 0 %
Eosinophils Absolute: 0 10*3/uL (ref 0.0–0.5)
Eosinophils Absolute: 0 10*3/uL (ref 0.0–0.5)
Eosinophils Relative: 0 %
Eosinophils Relative: 0 %
HCT: 44.4 % (ref 39.0–52.0)
HCT: 49.1 % (ref 39.0–52.0)
Hemoglobin: 14.6 g/dL (ref 13.0–17.0)
Hemoglobin: 15.9 g/dL (ref 13.0–17.0)
Immature Granulocytes: 1 %
Immature Granulocytes: 1 %
Lymphocytes Relative: 5 %
Lymphocytes Relative: 8 %
Lymphs Abs: 0.8 10*3/uL (ref 0.7–4.0)
Lymphs Abs: 1.2 10*3/uL (ref 0.7–4.0)
MCH: 30.2 pg (ref 26.0–34.0)
MCH: 30.3 pg (ref 26.0–34.0)
MCHC: 32.9 g/dL (ref 30.0–36.0)
MCHC: 32.4 g/dL (ref 30.0–36.0)
MCV: 91.7 fL (ref 80.0–100.0)
MCV: 93.7 fL (ref 80.0–100.0)
Monocytes Absolute: 1.1 10*3/uL — ABNORMAL HIGH (ref 0.1–1.0)
Monocytes Absolute: 1 10*3/uL (ref 0.1–1.0)
Monocytes Relative: 7 %
Monocytes Relative: 7 %
Neutro Abs: 14.5 10*3/uL — ABNORMAL HIGH (ref 1.7–7.7)
Neutro Abs: 12.9 10*3/uL — ABNORMAL HIGH (ref 1.7–7.7)
Neutrophils Relative %: 87 %
Neutrophils Relative %: 84 %
Platelets: 196 10*3/uL (ref 150–400)
Platelets: 204 10*3/uL (ref 150–400)
RBC: 4.84 MIL/uL (ref 4.22–5.81)
RBC: 5.24 MIL/uL (ref 4.22–5.81)
RDW: 13.4 % (ref 11.5–15.5)
RDW: 13.5 % (ref 11.5–15.5)
WBC: 16.4 10*3/uL — ABNORMAL HIGH (ref 4.0–10.5)
WBC: 15.2 10*3/uL — ABNORMAL HIGH (ref 4.0–10.5)
nRBC: 0 % (ref 0.0–0.2)
nRBC: 0 % (ref 0.0–0.2)

## 2019-10-27 MED ORDER — PANTOPRAZOLE SODIUM 40 MG PO TBEC: 40.0000 mg | DELAYED_RELEASE_TABLET | Freq: Every day | ORAL | 0 refills | Status: DC

## 2019-10-27 MED ORDER — ACETAMINOPHEN 500 MG PO TABS: 1000.0000 mg | ORAL_TABLET | Freq: Three times a day (TID) | ORAL | 0 refills | Status: AC

## 2019-10-27 MED ORDER — OXYCODONE HCL 5 MG PO TABS: 5.0000 mg | ORAL_TABLET | Freq: Four times a day (QID) | ORAL | 0 refills | Status: DC | PRN

## 2019-10-27 MED ORDER — ENOXAPARIN SODIUM 40 MG/0.4ML ~~LOC~~ SOLN: 40.0000 mg | Freq: Two times a day (BID) | SUBCUTANEOUS | 0 refills | Status: DC

## 2019-10-27 MED ORDER — ONDANSETRON 4 MG PO TBDP: 4.0000 mg | ORAL_TABLET | Freq: Four times a day (QID) | ORAL | 0 refills | Status: DC | PRN

## 2019-10-27 MED FILL — PANTOPRAZOLE SOD DR 40 MG T: 40 | 90 days supply | Qty: 90 | Fill #0

## 2019-10-27 MED FILL — ONDANSETRON ODT 4 MG TABLET: 4 | 5 days supply | Qty: 20 | Fill #0

## 2019-10-27 MED FILL — ENOXAPARIN SODIUM 40 MG/0.4: 40 | 14 days supply | Qty: 11 | Fill #0

## 2019-10-27 MED FILL — oxyCODONE HCL 5 MG TABS: 5 | 2 days supply | Qty: 10 | Fill #0

## 2019-10-27 NOTE — Discharge Instructions (Signed)
Enoxaparin injection What is this medicine? ENOXAPARIN (ee nox a PA rin) is used after knee, hip, or abdominal surgeries to prevent blood clotting. It is also used to treat existing blood clots in the lungs or in the veins. This medicine may be used for other purposes; ask your health care provider or pharmacist if you have questions. COMMON BRAND NAME(S): Lovenox What should I tell my health care provider before I take this medicine? They need to know if you have any of these conditions:  bleeding disorders, hemorrhage, or hemophilia  infection of the heart or heart valves  kidney or liver disease  previous stroke  prosthetic heart valve  recent surgery or delivery of a baby  ulcer in the stomach or intestine, diverticulitis, or other bowel disease  an unusual or allergic reaction to enoxaparin, heparin, pork or pork products, other medicines, foods, dyes, or preservatives  pregnant or trying to get pregnant  breast-feeding How should I use this medicine? This medicine is for injection under the skin. It is usually given by a health-care professional. You or a family member may be trained on how to give the injections. If you are to give yourself injections, make sure you understand how to use the syringe, measure the dose if necessary, and give the injection. To avoid bruising, do not rub the site where this medicine has been injected. Do not take your medicine more often than directed. Do not stop taking except on the advice of your doctor or health care professional. Make sure you receive a puncture-resistant container to dispose of the needles and syringes once you have finished with them. Do not reuse these items. Return the container to your doctor or health care professional for proper disposal. Talk to your pediatrician regarding the use of this medicine in children. Special care may be needed. Overdosage: If you think you have taken too much of this medicine contact a poison  control center or emergency room at once. NOTE: This medicine is only for you. Do not share this medicine with others. What if I miss a dose? If you miss a dose, take it as soon as you can. If it is almost time for your next dose, take only that dose. Do not take double or extra doses. What may interact with this medicine?  aspirin and aspirin-like medicines  certain medicines that treat or prevent blood clots  dipyridamole  NSAIDs, medicines for pain and inflammation, like ibuprofen or naproxen This list may not describe all possible interactions. Give your health care provider a list of all the medicines, herbs, non-prescription drugs, or dietary supplements you use. Also tell them if you smoke, drink alcohol, or use illegal drugs. Some items may interact with your medicine. What should I watch for while using this medicine? Visit your healthcare professional for regular checks on your progress. You may need blood work done while you are taking this medicine. Your condition will be monitored carefully while you are receiving this medicine. It is important not to miss any appointments. If you are going to need surgery or other procedure, tell your healthcare professional that you are using this medicine. Using this medicine for a long time may weaken your bones and increase the risk of bone fractures. Avoid sports and activities that might cause injury while you are using this medicine. Severe falls or injuries can cause unseen bleeding. Be careful when using sharp tools or knives. Consider using an Copy. Take special care brushing or flossing your  teeth. Report any injuries, bruising, or red spots on the skin to your healthcare professional. Wear a medical ID bracelet or chain. Carry a card that describes your disease and details of your medicine and dosage times. What side effects may I notice from receiving this medicine? Side effects that you should report to your doctor or health  care professional as soon as possible:  allergic reactions like skin rash, itching or hives, swelling of the face, lips, or tongue  bone pain  signs and symptoms of bleeding such as bloody or black, tarry stools; red or dark-brown urine; spitting up blood or brown material that looks like coffee grounds; red spots on the skin; unusual bruising or bleeding from the eye, gums, or nose  signs and symptoms of a blood clot such as chest pain; shortness of breath; pain, swelling, or warmth in the leg  signs and symptoms of a stroke such as changes in vision; confusion; trouble speaking or understanding; severe headaches; sudden numbness or weakness of the face, arm or leg; trouble walking; dizziness; loss of coordination Side effects that usually do not require medical attention (report to your doctor or health care professional if they continue or are bothersome):  hair loss  pain, redness, or irritation at site where injected This list may not describe all possible side effects. Call your doctor for medical advice about side effects. You may report side effects to FDA at 1-800-FDA-1088. Where should I keep my medicine? Keep out of the reach of children. Store at room temperature between 15 and 30 degrees C (59 and 86 degrees F). Do not freeze. If your injections have been specially prepared, you may need to store them in the refrigerator. Ask your pharmacist. Throw away any unused medicine after the expiration date. NOTE: This sheet is a summary. It may not cover all possible information. If you have questions about this medicine, talk to your doctor, pharmacist, or health care provider.  2020 Elsevier/Gold Standard (2017-01-02 11:25:34)     GASTRIC BYPASS/SLEEVE  Home Care Instructions   These instructions are to help you care for yourself when you go home.  Call: If you have any problems. . Call [phone] and ask for the surgeon on call . If you need emergency care related to your  bariatric surgery, come to the ER at [facility].  . Tell the ER staff that you are a new post-op gastric bypass or gastric sleeve patient   Signs and symptoms to report: . Severe vomiting or nausea o If you cannot keep down clear liquids for longer than 1 day, call your surgeon  . Abdominal pain that does not get better after taking your pain medication . Fever over 100.4 F with chills . Heart beating over 100 beats a minute . Shortness of breath at rest . Chest pain .  Redness, swelling, drainage, or foul odor at incision (surgical) sites .  If your incisions open or pull apart . Swelling or pain in calf (lower leg) . Diarrhea (Loose bowel movements that happen often), frequent watery, uncontrolled bowel movements . Constipation, (no bowel movements for 3 days) if this happens: Pick one o Milk of Magnesia, 2 tablespoons by mouth, 3 times a day for 2 days if needed o Stop taking Milk of Magnesia once you have a bowel movement o Call your doctor if constipation continues Or o Miralax  (instead of Milk of Magnesia) following the label instructions o Stop taking Miralax once you have a bowel movement o  Call your doctor if constipation continues . Anything you think is not normal   Normal side effects after surgery: . Unable to sleep at night or unable to focus . Irritability or moody . Being tearful (crying) or depressed These are common complaints, possibly related to your anesthesia medications that put you to sleep, stress of surgery, and change in lifestyle.  This usually goes away a few weeks after surgery.  If these feelings continue, call your primary care doctor.   Wound Care:  . Surgical glue:  Looks like a clear film over your incisions and will wear off a little at a time   . Showering: You may shower two (2) days after your surgery unless your surgeon tells you differently o Wash gently around incisions with warm soapy water, rinse well, and gently pat dry  o No tub  baths until staples are removed, steri-strips fall off or glue is gone.    Medications: Marland Kitchen Medications should be liquid or crushed if larger than the size of a dime . Extended release pills (medication that release a little bit at a time through the day) should NOT be crushed or cut. (examples include XL, ER, DR, SR) . Depending on the size and number of medications you take, you may need to space (take a few throughout the day)/change the time you take your medications so that you do not over-fill your pouch (smaller stomach) . Make sure you follow-up with your primary care doctor to make medication changes needed during rapid weight loss and life-style changes . If you have diabetes, follow up with the doctor that orders your diabetes medication(s) within one week after surgery and check your blood sugar regularly. . Do not drive while taking prescription pain medication  . It is ok to take Tylenol by the bottle instructions with your pain medicine or instead of your pain medicine as needed.  DO NOT TAKE NSAIDS (EXAMPLES OF NSAIDS:  IBUPROFREN/ NAPROXEN)  Diet:                    First 2 Weeks  You will see the dietician t about two (2) weeks after your surgery. The dietician will increase the types of foods you can eat if you are handling liquids well: Marland Kitchen If you have severe vomiting or nausea and cannot keep down clear liquids lasting longer than 1 day, call your surgeon @ [phone]) Protein Shake . Drink at least 2 ounces of shake 5-6 times per day . Each serving of protein shakes (usually 8 - 12 ounces) should have: o 15 grams of protein  o And no more than 5 grams of carbohydrate  . Goal for protein each day: o Men = 80 grams per day o Women = 60 grams per day . Protein powder may be added to fluids such as non-fat milk or Lactaid milk or unsweetened Soy/Almond milk (limit to 35 grams added protein powder per serving)  Hydration . Slowly increase the amount of water and other clear  liquids as tolerated (See Acceptable Fluids) . Slowly increase the amount of protein shake as tolerated  .  Sip fluids slowly and throughout the day.  Do not use straws. . May use sugar substitutes in small amounts (no more than 6 - 8 packets per day; i.e. Splenda)  Fluid Goal . The first goal is to drink at least 8 ounces of protein shake/drink per day (or as directed by the nutritionist); some examples of protein shakes are  Syntrax Nectar, [employer], EAS Edge HP, and Unjury. See handout from pre-op Bariatric Education Class: o Slowly increase the amount of protein shake you drink as tolerated o You may find it easier to slowly sip shakes throughout the day o It is important to get your proteins in first . Your fluid goal is to drink 64 - 100 ounces of fluid daily o It may take a few weeks to build up to this . 32 oz (or more) should be clear liquids  And  . 32 oz (or more) should be full liquids (see below for examples) . Liquids should not contain sugar, caffeine, or carbonation  Clear Liquids: . Water or Sugar-free flavored water (i.e. Fruit H2O, Propel) . Decaffeinated coffee or tea (sugar-free) . [employer], [employer], Minute [facility] . Sugar-free Jell-O . Bouillon or broth . Sugar-free Popsicle:   *Less than 20 calories each; Limit 1 per day  Full Liquids: Protein Shakes/Drinks + 2 choices per day of other full liquids . Full liquids must be: o No More Than 15 grams of Carbs per serving  o No More Than 3 grams of Fat per serving . Strained low-fat cream soup (except Cream of Potato or Tomato) . Non-Fat milk . Fat-free Lactaid Milk . Unsweetened Soy Or Unsweetened Almond Milk . Low Sugar yogurt (Dannon Lite & Fit, [place] yogurt; Oikos Triple Zero; Chobani Simply 100; Yoplait 100 calorie [place] - No Fruit on the Bottom)    Vitamins and Minerals . Start 1 day after surgery unless otherwise directed by your surgeon . 2 Chewable Bariatric Specific Multivitamin /  Multimineral Supplement with iron (Example: Bariatric Advantage Multi EA) . Chewable Calcium with Vitamin D-3 (Example: 3 Chewable Calcium Plus 600 with Vitamin D-3) o Take 500 mg three (3) times a day for a total of 1500 mg each day o Do not take all 3 doses of calcium at one time as it may cause constipation, and you can only absorb 500 mg  at a time  o Do not mix multivitamins containing iron with calcium supplements; take 2 hours apart . Menstruating women and those with a history of anemia (a blood disease that causes weakness) may need extra iron o Talk with your doctor to see if you need more iron . Do not stop taking or change any vitamins or minerals until you talk to your dietitian or surgeon . Your Dietitian and/or surgeon must approve all vitamin and mineral supplements   Activity and Exercise: Limit your physical activity as instructed by your doctor.  It is important to continue walking at home.  During this time, use these guidelines: . Do not lift anything greater than ten (10) pounds for at least two (2) weeks . Do not go back to work or drive until Engineer, production says you can . You may have sex when you feel comfortable  o It is VERY important for male patients to use a reliable birth control method; fertility often increases after surgery  o All hormonal birth control will be ineffective for 30 days after surgery due to medications given during surgery a barrier method must be used. o Do not get pregnant for at least 18 months . Start exercising as soon as your doctor tells you that you can o Make sure your doctor approves any physical activity . Start with a simple walking program . Walk 5-15 minutes each day, 7 days per week.  . Slowly increase until you are walking 30-45 minutes per  day Consider joining our [place] program. [phone] or email [email or web address]   Special Instructions Things to remember: . Use your CPAP when sleeping if this applies to you  . [facility] has two free Bariatric Surgery Support Groups that meet monthly o The 3rd Thursday of each month, 6 pm o The 2nd Friday of each month, 11:30 . It is very important to keep all follow up appointments with your surgeon, dietitian, primary care physician, and behavioral health practitioner . Routine follow up schedule with your surgeon include appointments at 2-3 weeks, 6-8 weeks, 6 months, and 1 year at a minimum.  Your surgeon may request to see you more often.   . After the first year, please follow up with your bariatric surgeon and dietitian at least once a year in order to maintain best weight loss results   [facility]: [place]: [phone] Bariatric Nurse Coordinator: [phone]      Reviewed and Endorsed  by [facility] Patient Education Committee, June, 2016 Edits Approved: Aug, 2018

## 2019-10-27 NOTE — Progress Notes (Signed)
Patient alert and oriented, Post op day 2.  Provided support and encouragement.  Encouraged pulmonary toilet, ambulation and small sips of liquids.  Completed 12 ounces bari clear fluid and 8 ounces of protein.  Patient feeling full, mild nausea.  Recheck CBC at 12 pm.  Lovenox education started, teaching kit at bedside. All questions answered.  Will continue to monitor.

## 2019-10-27 NOTE — Progress Notes (Signed)
Nutrition Brief Note  RD consulted for diet education for patient s/p bariatric surgery. Bariatric nurse coordinator providing education.   If nutrition issues arise, please consult RD.   Garvin Ellena, MS, RD, LDN Inpatient Clinical Dietitian Contact information available via Amion  

## 2019-10-27 NOTE — Discharge Summary (Signed)
Physician Discharge Summary  Patient ID: Richard [name] MRN: [id number] DOB/AGE: 58/27/1962 58 y.o.  PCP: Jearld Fenton, NP  Admit date: 10/26/2019 Discharge date: 10/27/2019  Admission Diagnoses:  Morbid obesity  Discharge Diagnoses:  same  Principal Problem:   S/P laparoscopic sleeve gastrectomy October 2021   Surgery:  Laparoscopic sleeve gastrectomy with 2 suture posterior repair of the hiatal hernia  Discharged Condition: improved  Hospital Course:   Had surgery on Tuesday.  Was begun on clears and advance to protein shakes.  His pulse was slow probably from betablocker.  WBC noted.  Clinically doing well and wanted to go home.   Consults: none  Significant Diagnostic Studies: none    Discharge Exam: Blood pressure (!) 164/69, pulse (!) 48, temperature 97.7 F (36.5 C), temperature source Oral, resp. rate 18, height 5\' 9"  (1.753 m), weight (!) 136.4 kg, SpO2 97 %. Incisions OK--Will give Lovenox for 14 days and then restart Eliquis.    Disposition: Discharge disposition: 01-Home or Self Care       Discharge Instructions    Ambulate hourly while awake   Complete by: As directed    Call MD for:  difficulty breathing, headache or visual disturbances   Complete by: As directed    Call MD for:  persistant dizziness or light-headedness   Complete by: As directed    Call MD for:  persistant nausea and vomiting   Complete by: As directed    Call MD for:  redness, tenderness, or signs of infection (pain, swelling, redness, odor or green/yellow discharge around incision site)   Complete by: As directed    Call MD for:  severe uncontrolled pain   Complete by: As directed    Call MD for:  temperature >101 F   Complete by: As directed    Diet bariatric full liquid   Complete by: As directed    Discharge instructions   Complete by: As directed    Resume Eliquis on day after last Lovenox injection.   Incentive spirometry   Complete by: As directed    Perform  hourly while awake     Allergies as of 10/27/2019      Reactions   Penicillins Anaphylaxis, Other (See Comments)   From childhood; uncertain of reaction Has patient had a PCN reaction causing immediate rash, facial/tongue/throat swelling, SOB or lightheadedness with hypotension: Unk Has patient had a PCN reaction causing severe rash involving mucus membranes or skin necrosis: Unk Has patient had a PCN reaction that required hospitalization: Unk Has patient had a PCN reaction occurring within the last 10 years: No If all of the above answers are "NO", then may proceed with Cephalosporin use.   Vancomycin Other (See Comments)   Red Mans' syndrome      Medication List    STOP taking these medications   Eliquis 5 MG Tabs tablet Generic drug: apixaban   HYDROcodone-acetaminophen 5-325 MG tablet Commonly known as: NORCO/VICODIN   predniSONE 10 MG tablet Commonly known as: DELTASONE   traMADol 50 MG tablet Commonly known as: ULTRAM     TAKE these medications   acetaminophen 500 MG tablet Commonly known as: TYLENOL Take 1,000 mg by mouth every 6 (six) hours as needed for moderate pain. What changed: Another medication with the same name was added. Make sure you understand how and when to take each.   acetaminophen 500 MG tablet Commonly known as: TYLENOL Take 2 tablets (1,000 mg total) by mouth every 8 (eight) hours for 5  days. What changed: You were already taking a medication with the same name, and this prescription was added. Make sure you understand how and when to take each.   baclofen 10 MG tablet Commonly known as: LIORESAL TAKE 1 TO 2 TABLETS BY MOUTH 3 TIMES DAILY AS NEEDED FOR MUSCLE SPASMS What changed: See the new instructions.   cyanocobalamin 1000 MCG/ML injection Commonly known as: (VITAMIN B-12) Inject 1,000 mcg into the muscle every 30 (thirty) days.   vitamin B-12 500 MCG tablet Commonly known as: CYANOCOBALAMIN Take 500 mcg by mouth daily.   vitamin  B-12 500 MCG tablet Commonly known as: CYANOCOBALAMIN Take 500 mcg by mouth daily.   enoxaparin 40 MG/0.4ML injection Commonly known as: LOVENOX Inject 0.4 mLs (40 mg total) into the skin 2 (two) times daily for 14 days.   flecainide 100 MG tablet Commonly known as: TAMBOCOR TAKE 1 TABLET BY MOUTH TWICE (2) DAILY What changed: See the new instructions.   furosemide 40 MG tablet Commonly known as: LASIX TAKE 1 TABLET (40 MG TOTAL) BY MOUTH DAILY AS NEEDED. MUST SCHEDULE ANNUAL EXAM What changed:   reasons to take this  additional instructions   metoprolol succinate 25 MG 24 hr tablet Commonly known as: TOPROL-XL TAKE 1 TABLET BY MOUTH ONCE A DAY WITH OR IMMEDIATELY FOLLOWING A MEAL What changed: See the new instructions.   ondansetron 4 MG disintegrating tablet Commonly known as: ZOFRAN-ODT Take 1 tablet (4 mg total) by mouth every 6 (six) hours as needed for nausea or vomiting.   oxyCODONE 5 MG immediate release tablet Commonly known as: Oxy IR/ROXICODONE Take 1 tablet (5 mg total) by mouth every 6 (six) hours as needed for severe pain.   pantoprazole 40 MG tablet Commonly known as: PROTONIX Take 1 tablet (40 mg total) by mouth daily.   SM STOOL SOFTENER/LAXATIVE PO Take 1 tablet by mouth daily as needed (constipation.).       Follow-up Information    Surgery, [place]. Go on 11/24/2019.   Specialty: General Surgery Why: at 9 am with Dr Johnathan Hausen.  Please arrive 15 minutes prior to appointment time.  Thank you Contact information: [address] [place] [address] [phone]        Carlena Hurl, PA-C. Go on 12/14/2019.   Specialty: General Surgery Why: at 130 pm.  Please arrive 15 minutes prior to appointment time.  Thank you Contact information: [place] [address] [phone]               Signed: Pedro Earls 10/27/2019, 3:25 PM

## 2019-10-27 NOTE — Progress Notes (Signed)
Pt is alert and oriented, tolerating liquids. Pt and wife watched video of Lovenox instruction. Pt d/cd home.

## 2019-10-27 NOTE — Progress Notes (Signed)
Patient alert and oriented, pain is controlled. Patient is tolerating fluids, advanced to protein shake today, patient is tolerating well.  Reviewed Gastric sleeve discharge instructions with patient and patient is able to articulate understanding.  Provided information on BELT program, Support Group and [facility] outpatient pharmacy. All questions answered, will continue to monitor.  Total fluid intake 820 Per dehydration protocol call back one week postop

## 2019-11-01 ENCOUNTER — Telehealth (HOSPITAL_COMMUNITY): Payer: Self-pay

## 2019-11-01 NOTE — Telephone Encounter (Signed)
Patient called to discuss post bariatric surgery.  Message left for patient to return phone call to discuss post op progression with the following questions. See below:   1.  Tell me about your pain and pain management?  2.  Let's talk about fluid intake.  How much total fluid are you taking in?  3.  How much protein have you taken in the last 2 days?  4.  Have you had nausea?  Tell me about when have experienced nausea and what you did to help?  5.  Has the frequency or color changed with your urine?  6.  Tell me what your incisions look like?  7.  Have you been passing gas? BM?  8.  If a problem or question were to arise who would you call?  Do you know contact numbers for [facility], [facility], and [facility]?  9.  How has the walking going?  10.  How are your vitamins and calcium going?  How are you taking them?

## 2019-11-03 ENCOUNTER — Ambulatory Visit (INDEPENDENT_AMBULATORY_CARE_PROVIDER_SITE_OTHER)
Admission: RE | Admit: 2019-11-03 | Discharge: 2019-11-03 | Disposition: A | Discharge status: Home / Self Care | Payer: PPO | Source: Ambulatory Visit | Attending: Internal Medicine | Admitting: Internal Medicine

## 2019-11-03 ENCOUNTER — Ambulatory Visit (INDEPENDENT_AMBULATORY_CARE_PROVIDER_SITE_OTHER): Payer: PPO | Admitting: Internal Medicine

## 2019-11-03 ENCOUNTER — Other Ambulatory Visit: Payer: Self-pay

## 2019-11-03 ENCOUNTER — Encounter: Payer: Self-pay | Admitting: Internal Medicine

## 2019-11-03 VITALS — BP 150/78 | HR 78 | Temp 98.2°F | Wt 286.0 lb

## 2019-11-03 DIAGNOSIS — I10 Essential (primary) hypertension: Secondary | ICD-10-CM | POA: Diagnosis not present

## 2019-11-03 DIAGNOSIS — R0789 Other chest pain: Secondary | ICD-10-CM

## 2019-11-03 DIAGNOSIS — Z903 Acquired absence of stomach [part of]: Secondary | ICD-10-CM

## 2019-11-03 DIAGNOSIS — Z86711 Personal history of pulmonary embolism: Secondary | ICD-10-CM

## 2019-11-03 DIAGNOSIS — I509 Heart failure, unspecified: Secondary | ICD-10-CM

## 2019-11-03 DIAGNOSIS — I4891 Unspecified atrial fibrillation: Secondary | ICD-10-CM | POA: Diagnosis not present

## 2019-11-03 DIAGNOSIS — J9811 Atelectasis: Secondary | ICD-10-CM | POA: Diagnosis not present

## 2019-11-03 NOTE — Progress Notes (Signed)
Subjective:    Patient ID: Richard [name], male    DOB: May 12, 1960, 59 y.o.   MRN: [id number]  HPI  Pt presents to the clinic today to follow up HTN. He recently underwent gastric sleeve, POD 8, has lost 32 lbs. His surgeon advised him to follow up with his PCP due to elevated blood pressure readings and a lower heart rate. He is currently taking Furosemide and his Metoprolol as prescribed. His BP today is 150/78, HR 57. He has a history of CHF, Afib, PE and DVT.  Review of Systems      Past Medical History:  Diagnosis Date  . Arthritis   . Arthrofibrosis of total knee arthroplasty ([place]) 05/30/2015  . Bilateral pulmonary embolism ([place])    a. 03/2015 CTA: acute bilat PE; b. IVC filter placed in 2017--> removed 2018.  Marland Kitchen Chest pain    a. 2015 Abnl MV;  b. 2015 Cath: nl cors.  . Chronic diastolic CHF (congestive heart failure) ([place])    a. 03/2015 Echo: EF 55-65%, poor windows, mildly dil RV.  Marland Kitchen DVT (deep venous thrombosis) ([place])    a. 03/2015 U/S: occlusive DVT w/in the distal aspect of the Left fem vein through the L popliteal vein.  Marland Kitchen Dyspnea    On exertion  . Dysrhythmia    afib   . GERD (gastroesophageal reflux disease)   . Headache   . Hypertension   . Hypertensive heart disease   . Obesity   . OSA (obstructive sleep apnea)    uses CPAP nightly settings at 15   . PAF (paroxysmal atrial fibrillation) ([place])   . Pneumonia    hx of years ago   . Primary localized osteoarthritis of left knee    a. 11/2014 s/p L TKA.  . Primary localized osteoarthritis of right knee    a. 02/2014 s/p R Partial Knee arthroplasty.  . Stiffness of left knee    a. 12/2014 s/p manipulation under anesthesia.  . Urine incontinence     Current Outpatient Medications  Medication Sig Dispense Refill  . acetaminophen (TYLENOL) 500 MG tablet Take 1,000 mg by mouth every 6 (six) hours as needed for moderate pain.    . baclofen (LIORESAL) 10 MG tablet TAKE 1 TO 2 TABLETS BY MOUTH 3 TIMES DAILY AS NEEDED  FOR MUSCLE SPASMS (Patient taking differently: Take 10 mg by mouth 3 (three) times daily as needed for muscle spasms. ) 180 each 0  . cyanocobalamin (,VITAMIN B-12,) 1000 MCG/ML injection Inject 1,000 mcg into the muscle every 30 (thirty) days.    Marland Kitchen enoxaparin (LOVENOX) 40 MG/0.4ML injection Inject 0.4 mLs (40 mg total) into the skin 2 (two) times daily for 14 days. 11.2 mL 0  . flecainide (TAMBOCOR) 100 MG tablet TAKE 1 TABLET BY MOUTH TWICE (2) DAILY (Patient taking differently: Take 100 mg by mouth 2 (two) times daily. ) 180 tablet 3  . furosemide (LASIX) 40 MG tablet TAKE 1 TABLET (40 MG TOTAL) BY MOUTH DAILY AS NEEDED. MUST SCHEDULE ANNUAL EXAM (Patient taking differently: Take 40 mg by mouth daily as needed (legs swelling). ) 30 tablet 0  . metoprolol succinate (TOPROL-XL) 25 MG 24 hr tablet TAKE 1 TABLET BY MOUTH ONCE A DAY WITH OR IMMEDIATELY FOLLOWING A MEAL (Patient taking differently: Take 25 mg by mouth daily. ) 90 tablet 3  . ondansetron (ZOFRAN-ODT) 4 MG disintegrating tablet Take 1 tablet (4 mg total) by mouth every 6 (six) hours as needed for nausea or vomiting.  20 tablet 0  . oxyCODONE (OXY IR/ROXICODONE) 5 MG immediate release tablet Take 1 tablet (5 mg total) by mouth every 6 (six) hours as needed for severe pain. 10 tablet 0  . pantoprazole (PROTONIX) 40 MG tablet Take 1 tablet (40 mg total) by mouth daily. 90 tablet 0  . Sennosides-Docusate Sodium (SM STOOL SOFTENER/LAXATIVE PO) Take 1 tablet by mouth daily as needed (constipation.).     Marland Kitchen vitamin B-12 (CYANOCOBALAMIN) 500 MCG tablet Take 500 mcg by mouth daily.     . vitamin B-12 (CYANOCOBALAMIN) 500 MCG tablet Take 500 mcg by mouth daily.     Current Facility-Administered Medications  Medication Dose Route Frequency Provider Last Rate Last Admin  . cyanocobalamin ((VITAMIN B-12)) injection 1,000 mcg  1,000 mcg Intramuscular Once Jearld Fenton, NP        Allergies  Allergen Reactions  . Penicillins Anaphylaxis and Other  (See Comments)    From childhood; uncertain of reaction Has patient had a PCN reaction causing immediate rash, facial/tongue/throat swelling, SOB or lightheadedness with hypotension: Unk Has patient had a PCN reaction causing severe rash involving mucus membranes or skin necrosis: Unk Has patient had a PCN reaction that required hospitalization: Unk Has patient had a PCN reaction occurring within the last 10 years: No If all of the above answers are "NO", then may proceed with Cephalosporin use.  . Vancomycin Other (See Comments)    Red Mans' syndrome    Family History  Problem Relation Age of Onset  . Coronary artery disease Mother   . Hyperlipidemia Mother   . Hypertension Mother   . Arthritis Mother   . Coronary artery disease Father   . Heart disease Paternal Grandmother   . Alzheimer's disease Paternal Grandfather     Social History   Socioeconomic History  . Marital status: Married    Spouse name: Not on file  . Number of children: 2  . Years of education: 78  . Highest education level: Not on file  Occupational History  . Occupation: Energy manager: [employer]  Tobacco Use  . Smoking status: Never Smoker  . Smokeless tobacco: Never Used  Vaping Use  . Vaping Use: Never used  Substance and Sexual Activity  . Alcohol use: Not Currently    Comment: occassional  . Drug use: No  . Sexual activity: Yes  Other Topics Concern  . Not on file  Social History Narrative  . Not on file   Social Determinants of Health   Financial Resource Strain:   . Difficulty of Paying Living Expenses: Not on file  Food Insecurity:   . Worried About Charity fundraiser in the Last Year: Not on file  . Ran Out of Food in the Last Year: Not on file  Transportation Needs:   . Lack of Transportation (Medical): Not on file  . Lack of Transportation (Non-Medical): Not on file  Physical Activity:   . Days of Exercise per Week: Not on file  . Minutes of Exercise per Session: Not on  file  Stress:   . Feeling of Stress : Not on file  Social Connections:   . Frequency of Communication with Friends and Family: Not on file  . Frequency of Social Gatherings with Friends and Family: Not on file  . Attends Religious Services: Not on file  . Active Member of Clubs or Organizations: Not on file  . Attends Archivist Meetings: Not on file  . Marital Status: Not  on file  Intimate Partner Violence:   . Fear of Current or Ex-Partner: Not on file  . Emotionally Abused: Not on file  . Physically Abused: Not on file  . Sexually Abused: Not on file     Constitutional: Denies fever, malaise, fatigue, headache or abrupt weight changes.  Respiratory: Denies difficulty breathing, shortness of breath, cough or sputum production.   Cardiovascular: Denies chest pain, chest tightness, palpitations or swelling in the hands or feet.  Gastrointestinal: Denies abdominal pain, bloating, constipation, diarrhea or blood in the stool.  Neurological: Denies dizziness, difficulty with memory, difficulty with speech or problems with balance and coordination.    No other specific complaints in a complete review of systems (except as listed in HPI above).  Objective:   Physical Exam BP (!) 150/78   Pulse 78   Temp 98.2 F (36.8 C) (Temporal)   Wt 286 lb (129.7 kg)   SpO2 96%   BMI 42.23 kg/m   Wt Readings from Last 3 Encounters:  10/26/19 (!) 300 lb 9.6 oz (136.4 kg)  10/19/19 (!) 305 lb (138.3 kg)  09/20/19 (!) 301 lb 12.8 oz (136.9 kg)    General: Appears his stated age, obese, in NAD. Cardiovascular: Bradycardic with regular rhythm.  Pulmonary/Chest: Normal effort and positive vesicular breath sounds. No respiratory distress. No wheezes, rales or ronchi noted.  Neurological: Alert and oriented. Coordination normal.    BMET    Component Value Date/Time   NA 141 10/19/2019 1337   NA 140 10/19/2019 1337   NA 141 10/31/2017 1438   K 4.9 10/19/2019 1337   K 4.9  10/19/2019 1337   CL 105 10/19/2019 1337   CL 104 10/19/2019 1337   CO2 28 10/19/2019 1337   CO2 27 10/19/2019 1337   GLUCOSE 88 10/19/2019 1337   GLUCOSE 86 10/19/2019 1337   BUN 18 10/19/2019 1337   BUN 17 10/19/2019 1337   BUN 18 10/31/2017 1438   CREATININE 1.27 (H) 10/26/2019 1153   CREATININE 1.27 12/06/2015 1600   CALCIUM 9.7 10/19/2019 1337   CALCIUM 9.5 10/19/2019 1337   GFRNONAA >60 10/26/2019 1153   GFRAA >60 10/19/2019 1337   GFRAA >60 10/19/2019 1337    Lipid Panel     Component Value Date/Time   CHOL 164 06/23/2018 1545   CHOL 170 10/31/2017 1438   TRIG 91.0 06/23/2018 1545   HDL 40.20 06/23/2018 1545   HDL 36 (L) 10/31/2017 1438   CHOLHDL 4 06/23/2018 1545   VLDL 18.2 06/23/2018 1545   LDLCALC 106 (H) 06/23/2018 1545   LDLCALC 108 (H) 10/31/2017 1438    CBC    Component Value Date/Time   WBC 15.2 (H) 10/27/2019 1133   RBC 5.24 10/27/2019 1133   HGB 15.9 10/27/2019 1133   HGB 14.0 10/31/2017 1438   HCT 49.1 10/27/2019 1133   HCT 43.5 10/31/2017 1438   PLT 204 10/27/2019 1133   PLT 220 10/31/2017 1438   MCV 93.7 10/27/2019 1133   MCV 83 10/31/2017 1438   MCH 30.3 10/27/2019 1133   MCHC 32.4 10/27/2019 1133   RDW 13.5 10/27/2019 1133   RDW 14.4 10/31/2017 1438   LYMPHSABS 1.2 10/27/2019 1133   LYMPHSABS 1.6 10/31/2017 1438   MONOABS 1.0 10/27/2019 1133   EOSABS 0.0 10/27/2019 1133   EOSABS 0.2 10/31/2017 1438   BASOSABS 0.0 10/27/2019 1133   BASOSABS 0.0 10/31/2017 1438    Hgb A1C Lab Results  Component Value Date   HGBA1C 5.7 06/23/2018  Assessment & Plan:   HTN, CHF, Afib, Chest Tightness, History of PE, S/P Gastric Sleeve, Jitteriness:  BP trending down, HR trending up Continue Furosemide and Metoprolol for now Will monitor BP during the post op period to see if this continues to trend down Will check CBC, BMET, A1C and D dimer today He has a follow up with bariatrics planned He does not have a follow up with  cardiology scheduled  Will monitor for now, will follow up after labs/xray is back. Return precautions discussed  Webb Silversmith, NP This visit occurred during the SARS-CoV-2 public health emergency.  Safety protocols were in place, including screening questions prior to the visit, additional usage of staff PPE, and extensive cleaning of exam room while observing appropriate contact time as indicated for disinfecting solutions.

## 2019-11-03 NOTE — Patient Instructions (Signed)

## 2019-11-04 ENCOUNTER — Telehealth: Payer: Self-pay | Admitting: Internal Medicine

## 2019-11-04 LAB — COMPREHENSIVE METABOLIC PANEL
ALT: 51 U/L (ref 0–53)
AST: 37 U/L (ref 0–37)
Albumin: 4.2 g/dL (ref 3.5–5.2)
Alkaline Phosphatase: 64 U/L (ref 39–117)
BUN: 24 mg/dL — ABNORMAL HIGH (ref 6–23)
CO2: 26 mEq/L (ref 19–32)
Calcium: 9.4 mg/dL (ref 8.4–10.5)
Chloride: 100 mEq/L (ref 96–112)
Creatinine, Ser: 1.19 mg/dL (ref 0.40–1.50)
GFR: 66.6 mL/min (ref >60.00)
Glucose, Bld: 74 mg/dL (ref 70–99)
Potassium: 4 mEq/L (ref 3.5–5.1)
Sodium: 137 mEq/L (ref 135–145)
Total Bilirubin: 0.6 mg/dL (ref 0.2–1.2)
Total Protein: 7 g/dL (ref 6.0–8.3)

## 2019-11-04 LAB — CBC
HCT: 47.1 % (ref 39.0–52.0)
Hemoglobin: 15.5 g/dL (ref 13.0–17.0)
MCHC: 32.9 g/dL (ref 30.0–36.0)
MCV: 92.3 fl (ref 78.0–100.0)
Platelets: 226 10*3/uL (ref 150.0–400.0)
RBC: 5.11 Mil/uL (ref 4.22–5.81)
RDW: 14.4 % (ref 11.5–15.5)
WBC: 10.5 10*3/uL (ref 4.0–10.5)

## 2019-11-04 LAB — D-DIMER, QUANTITATIVE: D-Dimer, Quant: 1.33 ug{FEU}/mL — ABNORMAL HIGH

## 2019-11-04 LAB — HEMOGLOBIN A1C: Hgb A1c MFr Bld: 5.4 % (ref 4.6–6.5)

## 2019-11-04 NOTE — Telephone Encounter (Signed)
Sending note to Avie Echevaria NP who will be in office later this morning, Gentry Fitz NP who is in office and [facility] CMA.

## 2019-11-04 NOTE — Telephone Encounter (Signed)
[place] Night - Client TELEPHONE ADVICE RECORD [facility] Patient Name: Richard [name] Gender: Male DOB: 05/12/1960 Age: 59 Y 20 M 17 D Return Phone Number: Address: City/State/Zip: [place] Client [place] Night - Client Client Site [place] Physician Webb Silversmith - NP Contact Type Call Who Is Calling Lab Lab Name [facility] Lab Phone Number [phone] Lab Tech Name [place] Lab Reference Number [id number] Q Chief Complaint Lab Result (Critical or Stat) Call Type Lab Send to RN Reason for Call Report lab results Initial Comment Caller states she is with [facility] Translation No Nurse Assessment Nurse: Danford Bad, RN, Anderson Malta Date/Time Eilene Ghazi Time): 11/04/2019 7:29:58 AM Is there an on-call provider listed? ---Yes Please list name of person reporting value (Lab Employee) and a contact number. ---Wendelyn Breslow [phone] Please document the following items: Lab name Lab value (read back to lab to verify) Reference range for lab value Date and time blood was drawn Collect time of birth for bilirubin results ---D-dimer 1.33 (normal < 0.50) collected 11/03/2019 2:36PM Please collect the patient contact information from the lab. (name, phone number and address) ---No phone number Disp. Time Eilene Ghazi Time) Disposition Final User 11/04/2019 7:35:22 AM Called On-Call Provider Danford Bad, RN, Anderson Malta 11/04/2019 7:36:10 AM Clinical Call Yes Danford Bad, RN, [facility] DoctorName Phone DateTime Result/Outcome Message Type Notes Deborra Medina - MD [phone] 11/04/2019 7:35:22 AM Called On Call Provider - Reached Doctor Paged Deborra Medina - MD 11/04/2019 7:35:53 AM Spoke with On Call - General Message Result Results reported and acknowledged.

## 2019-11-04 NOTE — Telephone Encounter (Signed)
Spoke with Threasa Beards to let her know that this came through to Dr. Derrel Nip this morning at 7:45am and that Dr. Derrel Nip just wanted to make sure that Webb Silversmith was aware. Threasa Beards stated that Webb Silversmith would be in the office later this morning.

## 2019-11-04 NOTE — Telephone Encounter (Signed)
Patient had a positive D Dimer that was called to me this morning at 7:45 am.  Webb Silversmith is out of the office until late morning.   Confirm with her RN that she will review and decide on action.  I see that patient is taking Lovenox,  Can you confirm

## 2019-11-04 NOTE — Telephone Encounter (Signed)
See result note.  

## 2019-11-08 ENCOUNTER — Ambulatory Visit (INDEPENDENT_AMBULATORY_CARE_PROVIDER_SITE_OTHER): Payer: No Typology Code available for payment source | Admitting: Internal Medicine

## 2019-11-08 DIAGNOSIS — K219 Gastro-esophageal reflux disease without esophagitis: Secondary | ICD-10-CM | POA: Diagnosis not present

## 2019-11-08 DIAGNOSIS — Z7189 Other specified counseling: Secondary | ICD-10-CM

## 2019-11-08 DIAGNOSIS — G4733 Obstructive sleep apnea (adult) (pediatric): Secondary | ICD-10-CM | POA: Diagnosis not present

## 2019-11-08 DIAGNOSIS — I5032 Chronic diastolic (congestive) heart failure: Secondary | ICD-10-CM | POA: Diagnosis not present

## 2019-11-08 NOTE — Patient Instructions (Signed)

## 2019-11-08 NOTE — Progress Notes (Signed)
[facility] [place], [place] [address]  Pulmonary Sleep Medicine   Office Visit Note  Patient Name: Richard [name]  DOB: 11/17/1960 MRN [id number]    Chief Complaint: Obstructive Sleep Apnea visit  Brief History:  Richard [name] is seen today for follow up The patient has a 8 year history of sleep apnea. He recently had bariatric surgery and has lost 30 lbs in 2 weeks.Patient is using PAP nightly.  The patient feels more rested after sleeping with PAP but is a bit more sleepy.  The patient reports benefiting from PAP use. Reported sleepiness is  Slightly worse and the Epworth Sleepiness Score is 14 out of 24. The patient does take naps but will use his CPAP. The patient complains of the following: dry mouth.  The compliance download shows excellent  compliance with an average use time of 7.2 hours. The AHI is 1.3  The patient does not complain of limb movements disrupting sleep.  ROS  General: (-) fever, (-) chills, (-) night sweat Nose and Sinuses: (-) nasal stuffiness or itchiness, (-) postnasal drip, (-) nosebleeds, (-) sinus trouble. Mouth and Throat: (-) sore throat, (-) hoarseness. Neck: (-) swollen glands, (-) enlarged thyroid, (-) neck pain. Respiratory: - cough, + shortness of breath, - wheezing. Neurologic: - numbness, - tingling. Psychiatric: - anxiety, - depression   Current Medication: Outpatient Encounter Medications as of 11/08/2019  Medication Sig  . acetaminophen (TYLENOL) 500 MG tablet Take 1,000 mg by mouth every 6 (six) hours as needed for moderate pain.  . baclofen (LIORESAL) 10 MG tablet TAKE 1 TO 2 TABLETS BY MOUTH 3 TIMES DAILY AS NEEDED FOR MUSCLE SPASMS (Patient taking differently: Take 10 mg by mouth 3 (three) times daily as needed for muscle spasms. )  . cyanocobalamin (,VITAMIN B-12,) 1000 MCG/ML injection Inject 1,000 mcg into the muscle every 30 (thirty) days. (Patient not taking: Reported on 11/03/2019)  . enoxaparin (LOVENOX)  40 MG/0.4ML injection Inject 0.4 mLs (40 mg total) into the skin 2 (two) times daily for 14 days.  . flecainide (TAMBOCOR) 100 MG tablet TAKE 1 TABLET BY MOUTH TWICE (2) DAILY (Patient taking differently: Take 100 mg by mouth 2 (two) times daily. )  . metoprolol succinate (TOPROL-XL) 25 MG 24 hr tablet TAKE 1 TABLET BY MOUTH ONCE A DAY WITH OR IMMEDIATELY FOLLOWING A MEAL (Patient taking differently: Take 25 mg by mouth daily. )  . ondansetron (ZOFRAN-ODT) 4 MG disintegrating tablet Take 1 tablet (4 mg total) by mouth every 6 (six) hours as needed for nausea or vomiting.  Marland Kitchen oxyCODONE (OXY IR/ROXICODONE) 5 MG immediate release tablet Take 1 tablet (5 mg total) by mouth every 6 (six) hours as needed for severe pain.  . pantoprazole (PROTONIX) 40 MG tablet Take 1 tablet (40 mg total) by mouth daily.  Orlie Dakin Sodium (SM STOOL SOFTENER/LAXATIVE PO) Take 1 tablet by mouth daily as needed (constipation.).   Marland Kitchen vitamin B-12 (CYANOCOBALAMIN) 500 MCG tablet Take 500 mcg by mouth daily.   . vitamin B-12 (CYANOCOBALAMIN) 500 MCG tablet Take 500 mcg by mouth daily.  . [DISCONTINUED] furosemide (LASIX) 40 MG tablet TAKE 1 TABLET (40 MG TOTAL) BY MOUTH DAILY AS NEEDED. MUST SCHEDULE ANNUAL EXAM (Patient taking differently: Take 40 mg by mouth daily as needed (legs swelling). )   Facility-Administered Encounter Medications as of 11/08/2019  Medication  . cyanocobalamin ((VITAMIN B-12)) injection 1,000 mcg    Surgical History: Past Surgical History:  Procedure Laterality Date  . APPENDECTOMY    .  CARDIAC CATHETERIZATION    . CARPAL TUNNEL RELEASE Left 03/2018  . EXAM UNDER ANESTHESIA WITH MANIPULATION OF KNEE Left 05/30/2015   Procedure: EXAM UNDER ANESTHESIA WITH MANIPULATION OF LEFT KNEE;  Surgeon: Marchia Bond, MD;  Location: [facility];  Service: Orthopedics;  Laterality: Left;  . HIATAL HERNIA REPAIR N/A 10/26/2019   Procedure: HIATAL HERNIA REPAIR;  Surgeon: Johnathan Hausen, MD;  [facility];  Service: General;  Laterality: N/A;  . I & D KNEE WITH POLY EXCHANGE Left 10/30/2015   Procedure: IRRIGATION AND DEBRIDEMENT KNEE WITH POLY EXCHANGE PLACE SPACERS;  Surgeon: Frederik Pear, MD;  Location: [place];  Service: Orthopedics;  Laterality: Left;  OFF ELIQUIST 3 DAYS  . IVC FILTER PLACEMENT ([facility] HX)  04/13/15  . IVC FILTER REMOVAL N/A 08/13/2016   Procedure: IVC Filter Removal;  Surgeon: Katha Cabal, MD;  Location: [place] CV LAB;  Service: Cardiovascular;  Laterality: N/A;  . JOINT REPLACEMENT    . KNEE ARTHROSCOPY Left 08/08/2015   Procedure: LEFT KNEE MANIPULATION WITH ARTHROSCOPIC LYSIS OF ADHESIONS AND ASPIRATION;  Surgeon: Marchia Bond, MD;  Location: [place];  Service: Orthopedics;  Laterality: Left;  . KNEE CLOSED REDUCTION Left 01/20/2015   Procedure: LEFT KNEE MANIPULATION;  Surgeon: Marchia Bond, MD;  Location: [place];  Service: Orthopedics;  Laterality: Left;  . LAPAROSCOPIC GASTRIC SLEEVE RESECTION N/A 10/26/2019   Procedure: LAPAROSCOPIC SLEEVE GASTRECTOMY;  Surgeon: Johnathan Hausen, MD;  [facility];  Service: General;  Laterality: N/A;  . LEFT HEART CATHETERIZATION WITH CORONARY ANGIOGRAM N/A 01/12/2014   Procedure: LEFT HEART CATHETERIZATION WITH CORONARY ANGIOGRAM;  Surgeon: Jettie Booze, MD;  Location: [facility] CATH LAB;  Service: Cardiovascular;  Laterality: N/A;  . ORTHOPEDIC SURGERY Left    arthroscopy x3  . PARTIAL KNEE ARTHROPLASTY Right 02/25/2014   Procedure: RIGHT KNEE ARTHROPLASTY CONDYLE AND PLATEAU MEDIAL COMPARTMENT ;  Surgeon: Johnny Bridge, MD;  Location: [place];  Service: Orthopedics;  Laterality: Right;  . PERIPHERAL VASCULAR CATHETERIZATION N/A 03/29/2015   Procedure: IVC Filter Insertion, and possible thrombectomy;  Surgeon: Katha Cabal, MD;  Location: [place] CV LAB;  Service: Cardiovascular;  Laterality: N/A;  . ROTATOR CUFF REPAIR Left   . TOTAL KNEE ARTHROPLASTY  12/06/2014    Procedure: TOTAL KNEE ARTHROPLASTY;  Surgeon: Marchia Bond, MD;  Location: [place];  Service: Orthopedics;;  . TOTAL KNEE REVISION Left 02/08/2016   Procedure: TOTAL KNEE REVISION;  Surgeon: Frederik Pear, MD;  Location: [place];  Service: Orthopedics;  Laterality: Left;  . UPPER GI ENDOSCOPY N/A 10/26/2019   Procedure: UPPER GI ENDOSCOPY;  Surgeon: Johnathan Hausen, MD;  [facility];  Service: General;  Laterality: N/A;    Medical History: Past Medical History:  Diagnosis Date  . Arthritis   . Arthrofibrosis of total knee arthroplasty ([place]) 05/30/2015  . Bilateral pulmonary embolism ([place])    a. 03/2015 CTA: acute bilat PE; b. IVC filter placed in 2017--> removed 2018.  Marland Kitchen Chest pain    a. 2015 Abnl MV;  b. 2015 Cath: nl cors.  . Chronic diastolic CHF (congestive heart failure) ([place])    a. 03/2015 Echo: EF 55-65%, poor windows, mildly dil RV.  Marland Kitchen DVT (deep venous thrombosis) ([place])    a. 03/2015 U/S: occlusive DVT w/in the distal aspect of the Left fem vein through the L popliteal vein.  Marland Kitchen Dyspnea    On exertion  . Dysrhythmia    afib   . GERD (gastroesophageal  reflux disease)   . Headache   . Hypertension   . Hypertensive heart disease   . Obesity   . OSA (obstructive sleep apnea)    uses CPAP nightly settings at 15   . PAF (paroxysmal atrial fibrillation) ([place])   . Pneumonia    hx of years ago   . Primary localized osteoarthritis of left knee    a. 11/2014 s/p L TKA.  . Primary localized osteoarthritis of right knee    a. 02/2014 s/p R Partial Knee arthroplasty.  . Stiffness of left knee    a. 12/2014 s/p manipulation under anesthesia.  . Urine incontinence     Family History: Non contributory to the present illness  Social History: Social History   Socioeconomic History  . Marital status: Married    Spouse name: Not on file  . Number of children: 2  . Years of education: 21  . Highest education level: Not on file  Occupational History  . Occupation: Energy manager:  [employer]  Tobacco Use  . Smoking status: Never Smoker  . Smokeless tobacco: Never Used  Vaping Use  . Vaping Use: Never used  Substance and Sexual Activity  . Alcohol use: Not Currently    Comment: occassional  . Drug use: No  . Sexual activity: Yes  Other Topics Concern  . Not on file  Social History Narrative  . Not on file   Social Determinants of Health   Financial Resource Strain:   . Difficulty of Paying Living Expenses: Not on file  Food Insecurity:   . Worried About Charity fundraiser in the Last Year: Not on file  . Ran Out of Food in the Last Year: Not on file  Transportation Needs:   . Lack of Transportation (Medical): Not on file  . Lack of Transportation (Non-Medical): Not on file  Physical Activity:   . Days of Exercise per Week: Not on file  . Minutes of Exercise per Session: Not on file  Stress:   . Feeling of Stress : Not on file  Social Connections:   . Frequency of Communication with Friends and Family: Not on file  . Frequency of Social Gatherings with Friends and Family: Not on file  . Attends Religious Services: Not on file  . Active Member of Clubs or Organizations: Not on file  . Attends Archivist Meetings: Not on file  . Marital Status: Not on file  Intimate Partner Violence:   . Fear of Current or Ex-Partner: Not on file  . Emotionally Abused: Not on file  . Physically Abused: Not on file  . Sexually Abused: Not on file    Vital Signs: Blood pressure (!) 141/76, pulse (!) 52, height 5\' 9"  (1.753 m), weight 283 lb (128.4 kg), SpO2 97 %.  Examination: General Appearance: The patient is well-developed, well-nourished, and in no distress. Neck Circumference: 52 Skin: Gross inspection of skin unremarkable. Head: normocephalic, no gross deformities. Eyes: no gross deformities noted. ENT: ears appear grossly normal Neurologic: Alert and oriented. No involuntary movements.    EPWORTH SLEEPINESS SCALE:  Scale:  (0)= no  chance of dozing; (1)= slight chance of dozing; (2)= moderate chance of dozing; (3)= high chance of dozing  Chance  Situtation    Sitting and reading: 1    Watching TV: 2    Sitting Inactive in public: 2    As a passenger in car: 1      Lying down to rest:  3    Sitting and talking: 1    Sitting quielty after lunch: 3    In a car, stopped in traffic: 1   TOTAL SCORE:   14 out of 24    SLEEP STUDIES:  1. Split 02/2011 AHi 71 SpO66min 50% CPAP15   CPAP COMPLIANCE DATA:  Date Range: 11/05/18-11/04/19  Average Daily Use: 7.2 hours  Median Use: 7.3  Compliance for > 4 Hours: 98%  AHI: 1.3 respiratory events per hour  Days Used: 362/365  Mask Leak: 46.2  95th Percentile Pressure: 15   LABS: Recent Results (from the past 2160 hour(s))  POCT urinalysis dipstick     Status: None   Collection Time: 09/16/19 10:27 AM  Result Value Ref Range   Color, UA yellow    Clarity, UA clear    Glucose, UA Negative Negative   Bilirubin, UA neg    Ketones, UA neg    Spec Grav, UA 1.025 1.010 - 1.025   Blood, UA neg    pH, UA 6.0 5.0 - 8.0   Protein, UA Negative Negative   Urobilinogen, UA 0.2 0.2 or 1.0 E.U./dL   Nitrite, UA neg    Leukocytes, UA Negative Negative   Appearance     Odor    Comprehensive metabolic panel per protocol     Status: Abnormal   Collection Time: 09/20/19 11:44 AM  Result Value Ref Range   Sodium 143 135 - 145 mmol/L   Potassium 4.6 3.5 - 5.1 mmol/L   Chloride 107 98 - 111 mmol/L   CO2 27 22 - 32 mmol/L   Glucose, Bld 92 70 - 99 mg/dL    Comment: Glucose reference range applies only to samples taken after fasting for at least 8 hours.   BUN 23 (H) 6 - 20 mg/dL   Creatinine, Ser 1.23 0.61 - 1.24 mg/dL   Calcium 9.4 8.9 - 10.3 mg/dL   Total Protein 8.1 6.5 - 8.1 g/dL   Albumin 4.3 3.5 - 5.0 g/dL   AST 33 15 - 41 U/L   ALT 38 0 - 44 U/L   Alkaline Phosphatase 69 38 - 126 U/L   Total Bilirubin 1.0 0.3 - 1.2 mg/dL   GFR calc non Af Amer  >60 >60 mL/min   GFR calc Af Amer >60 >60 mL/min   Anion gap 9 5 - 15    Comment: Performed at [facility], [place] [address], [place], [place] [address]  CBC per protocol     Status: None   Collection Time: 09/20/19 11:44 AM  Result Value Ref Range   WBC 7.6 4.0 - 10.5 K/uL   RBC 5.14 4.22 - 5.81 MIL/uL   Hemoglobin 15.3 13.0 - 17.0 g/dL   HCT 47.3 39 - 52 %   MCV 92.0 80.0 - 100.0 fL   MCH 29.8 26.0 - 34.0 pg   MCHC 32.3 30.0 - 36.0 g/dL   RDW 13.7 11.5 - 15.5 %   Platelets 204 150 - 400 K/uL   nRBC 0.0 0.0 - 0.2 %    Comment: Performed at [facility], [place] [address], [place], [place] [address]  Type and screen [place]     Status: None   Collection Time: 09/20/19 11:44 AM  Result Value Ref Range   ABO/RH(D) A POS    Antibody Screen NEG    Sample Expiration 10/01/2019,2359    Extend sample reason      NO TRANSFUSIONS OR  PREGNANCY IN THE PAST 3 MONTHS Performed at [place] [address] [place], [place] [address]   CBC     Status: None   Collection Time: 10/19/19  1:37 PM  Result Value Ref Range   WBC 9.7 4.0 - 10.5 K/uL   RBC 5.30 4.22 - 5.81 MIL/uL   Hemoglobin 15.9 13.0 - 17.0 g/dL   HCT 49.1 39 - 52 %   MCV 92.6 80.0 - 100.0 fL   MCH 30.0 26.0 - 34.0 pg   MCHC 32.4 30.0 - 36.0 g/dL   RDW 13.5 11.5 - 15.5 %   Platelets 218 150 - 400 K/uL   nRBC 0.0 0.0 - 0.2 %    Comment: Performed at [facility], [place] [address], [place], [place] [address]  Basic metabolic panel     Status: Abnormal   Collection Time: 10/19/19  1:37 PM  Result Value Ref Range   Sodium 141 135 - 145 mmol/L   Potassium 4.9 3.5 - 5.1 mmol/L   Chloride 105 98 - 111 mmol/L   CO2 28 22 - 32 mmol/L   Glucose, Bld 88 70 - 99 mg/dL    Comment: Glucose reference range applies only to samples taken after fasting for at least 8 hours.   BUN 18 6 - 20 mg/dL   Creatinine, Ser 1.26 (H) 0.61 - 1.24 mg/dL    Calcium 9.7 8.9 - 10.3 mg/dL   GFR calc non Af Amer >60 >60 mL/min   GFR calc Af Amer >60 >60 mL/min   Anion gap 8 5 - 15    Comment: Performed at [facility], [place] [address] [place], [place] [address]  Comprehensive metabolic panel     Status: None   Collection Time: 10/19/19  1:37 PM  Result Value Ref Range   Sodium 140 135 - 145 mmol/L   Potassium 4.9 3.5 - 5.1 mmol/L   Chloride 104 98 - 111 mmol/L   CO2 27 22 - 32 mmol/L   Glucose, Bld 86 70 - 99 mg/dL    Comment: Glucose reference range applies only to samples taken after fasting for at least 8 hours.   BUN 17 6 - 20 mg/dL   Creatinine, Ser 1.20 0.61 - 1.24 mg/dL   Calcium 9.5 8.9 - 10.3 mg/dL   Total Protein 7.6 6.5 - 8.1 g/dL   Albumin 3.9 3.5 - 5.0 g/dL   AST 27 15 - 41 U/L   ALT 34 0 - 44 U/L   Alkaline Phosphatase 64 38 - 126 U/L   Total Bilirubin 0.3 0.3 - 1.2 mg/dL   GFR calc non Af Amer >60 >60 mL/min   GFR calc Af Amer >60 >60 mL/min   Anion gap 9 5 - 15    Comment: Performed at [facility], [place] [address] [place], [place] [address]  Differential     Status: None   Collection Time: 10/19/19  1:37 PM  Result Value Ref Range   Neutrophils Relative % 76 %   Neutro Abs 7.5 1.7 - 7.7 K/uL   Lymphocytes Relative 12 %   Lymphs Abs 1.1 0.7 - 4.0 K/uL   Monocytes Relative 8 %   Monocytes Absolute 0.8 0.1 - 1.0 K/uL   Eosinophils Relative 2 %   Eosinophils Absolute 0.2 0.0 - 0.5 K/uL   Basophils Relative 1 %   Basophils Absolute 0.1 0.0 - 0.1 K/uL   Immature Granulocytes 1 %  Abs Immature Granulocytes 0.05 0.00 - 0.07 K/uL    Comment: Performed at [facility], [place] [address], [place], [place] [address]  Type and screen     Status: None   Collection Time: 10/26/19  6:07 AM  Result Value Ref Range   ABO/RH(D) A POS    Antibody Screen NEG    Sample Expiration      10/29/2019,2359 Performed at [facility], [place] [address],  [place], [place] [address]   Surgical pathology     Status: None   Collection Time: 10/26/19  9:01 AM  Result Value Ref Range   SURGICAL PATHOLOGY      SURGICAL PATHOLOGY CASE: [id number] PATIENT: Ivette Loyal Surgical Pathology Report     Clinical History: Morbid obesity (crm)     FINAL MICROSCOPIC DIAGNOSIS:  A. STOMACH, SLEEVE RESECTION: - Gross diagnosis only: Portion of unremarkable stomach.  GROSS DESCRIPTION:  Received fresh is a 21.2 x 4.3 x 2.8 cm partial gastrectomy.  The margin is stapled.  The serosa is smooth, tan-pink.  The mucosa is glistening, tan-red, with preservation of the rugal folds.  There are no discrete lesions or masses.  No sections are submitted.  [facility] 10/26/2019)   Final Diagnosis performed by Gillie Manners, MD.   Electronically signed 10/26/2019 Technical component performed at [facility], [place] [address] [place], [place] [address].  Professional component performed at [employer] [facility], [place] [address], [place], [place] [address].  Immunohistochemistry Technical component (if applicable) was performed at [facility] [address] [place], [place], [place] [address].   IMMUNOHISTOCHEMISTRY DISCLAIMER (if applicable): Some of these immunohistochemical stains may have been developed and the performance characteristics determine by [facility]. Some may not have been cleared or approved by the U.S. Food and Drug Administration. The FDA has determined that such clearance or approval is not necessary. This test is used for clinical purposes. It should not be regarded as investigational or for research. This laboratory is certified under the [place] (CLIA-88) as qualified to perform high complexity clinical laboratory testing.  The controls stained appropriately.   CBC     Status: Abnormal   Collection Time: 10/26/19 11:53 AM   Result Value Ref Range   WBC 11.3 (H) 4.0 - 10.5 K/uL   RBC 5.09 4.22 - 5.81 MIL/uL   Hemoglobin 15.4 13.0 - 17.0 g/dL   HCT 47.2 39 - 52 %   MCV 92.7 80.0 - 100.0 fL   MCH 30.3 26.0 - 34.0 pg   MCHC 32.6 30.0 - 36.0 g/dL   RDW 13.5 11.5 - 15.5 %   Platelets 182 150 - 400 K/uL   nRBC 0.0 0.0 - 0.2 %    Comment: Performed at [facility], [place] [address] [place], [place] [address]  Creatinine, serum     Status: Abnormal   Collection Time: 10/26/19 11:53 AM  Result Value Ref Range   Creatinine, Ser 1.27 (H) 0.61 - 1.24 mg/dL   GFR calc non Af Amer >60 >60 mL/min    Comment: Performed at [facility], [place] [address] [place], [place] [address]  CBC WITH DIFFERENTIAL     Status: Abnormal   Collection Time: 10/27/19  3:39 AM  Result Value Ref Range   WBC 16.4 (H) 4.0 - 10.5 K/uL   RBC 4.84 4.22 - 5.81 MIL/uL   Hemoglobin 14.6 13.0 - 17.0 g/dL  HCT 44.4 39 - 52 %   MCV 91.7 80.0 - 100.0 fL   MCH 30.2 26.0 - 34.0 pg   MCHC 32.9 30.0 - 36.0 g/dL   RDW 13.4 11.5 - 15.5 %   Platelets 196 150 - 400 K/uL   nRBC 0.0 0.0 - 0.2 %   Neutrophils Relative % 87 %   Neutro Abs 14.5 (H) 1.7 - 7.7 K/uL   Lymphocytes Relative 5 %   Lymphs Abs 0.8 0.7 - 4.0 K/uL   Monocytes Relative 7 %   Monocytes Absolute 1.1 (H) 0.1 - 1.0 K/uL   Eosinophils Relative 0 %   Eosinophils Absolute 0.0 0.0 - 0.5 K/uL   Basophils Relative 0 %   Basophils Absolute 0.0 0.0 - 0.1 K/uL   Immature Granulocytes 1 %   Abs Immature Granulocytes 0.09 (H) 0.00 - 0.07 K/uL    Comment: Performed at [facility], [place] [address], [place], [place] [address]  CBC with Differential/Platelet     Status: Abnormal   Collection Time: 10/27/19 11:33 AM  Result Value Ref Range   WBC 15.2 (H) 4.0 - 10.5 K/uL   RBC 5.24 4.22 - 5.81 MIL/uL   Hemoglobin 15.9 13.0 - 17.0 g/dL   HCT 49.1 39 - 52 %   MCV 93.7 80.0 - 100.0 fL   MCH 30.3 26.0 - 34.0 pg   MCHC 32.4 30.0 - 36.0 g/dL    RDW 13.5 11.5 - 15.5 %   Platelets 204 150 - 400 K/uL   nRBC 0.0 0.0 - 0.2 %   Neutrophils Relative % 84 %   Neutro Abs 12.9 (H) 1.7 - 7.7 K/uL   Lymphocytes Relative 8 %   Lymphs Abs 1.2 0.7 - 4.0 K/uL   Monocytes Relative 7 %   Monocytes Absolute 1.0 0.1 - 1.0 K/uL   Eosinophils Relative 0 %   Eosinophils Absolute 0.0 0.0 - 0.5 K/uL   Basophils Relative 0 %   Basophils Absolute 0.0 0.0 - 0.1 K/uL   Immature Granulocytes 1 %   Abs Immature Granulocytes 0.07 0.00 - 0.07 K/uL    Comment: Performed at [facility], [place] [address] [place], [place] [address]  CBC     Status: None   Collection Time: 11/03/19  2:36 PM  Result Value Ref Range   WBC 10.5 4.0 - 10.5 K/uL   RBC 5.11 4.22 - 5.81 Mil/uL   Platelets 226.0 150 - 400 K/uL   Hemoglobin 15.5 13.0 - 17.0 g/dL   HCT 47.1 39 - 52 %   MCV 92.3 78.0 - 100.0 fl   MCHC 32.9 30.0 - 36.0 g/dL   RDW 14.4 11.5 - 15.5 %  Comprehensive metabolic panel     Status: Abnormal   Collection Time: 11/03/19  2:36 PM  Result Value Ref Range   Sodium 137 135 - 145 mEq/L   Potassium 4.0 3.5 - 5.1 mEq/L   Chloride 100 96 - 112 mEq/L   CO2 26 19 - 32 mEq/L   Glucose, Bld 74 70 - 99 mg/dL   BUN 24 (H) 6 - 23 mg/dL   Creatinine, Ser 1.19 0.40 - 1.50 mg/dL   Total Bilirubin 0.6 0.2 - 1.2 mg/dL   Alkaline Phosphatase 64 39 - 117 U/L   AST 37 0 - 37 U/L   ALT 51 0 - 53 U/L   Total Protein 7.0 6.0 - 8.3 g/dL   Albumin 4.2 3.5 - 5.2 g/dL  GFR 66.60 >60.00 mL/min   Calcium 9.4 8.4 - 10.5 mg/dL  Hemoglobin A1c     Status: None   Collection Time: 11/03/19  2:36 PM  Result Value Ref Range   Hgb A1c MFr Bld 5.4 4.6 - 6.5 %    Comment: Glycemic Control Guidelines for People with Diabetes:Non Diabetic:  <6%Goal of Therapy: <7%Additional Action Suggested:  >8%   D-dimer, quantitative (not at [facility])     Status: Abnormal   Collection Time: 11/03/19  2:36 PM  Result Value Ref Range   D-Dimer, Quant 1.33 (H) <0.50 mcg/mL FEU     Comment: . The D-Dimer test is used frequently to exclude an acute PE or DVT. In patients with a low to moderate clinical risk assessment and a D-Dimer result <0.50 mcg/mL FEU, the likelihood of a PE or DVT is very low. However, a thromboembolic event should not be excluded solely on the basis of the D-Dimer level. Increased levels of D-Dimer are associated with a PE, DVT, DIC, malignancies, inflammation, sepsis, surgery, trauma, pregnancy, and advancing patient age. [[place] 2006 11:295(2):199-207] . For additional information, please refer to: http://education.questdiagnostics.com/faq/FAQ149 (This link is being provided for informational/ educational purposes only) .     Radiology: DG Chest 2 View  Result Date: 11/05/2019 CLINICAL DATA:  Chest tightness EXAM: CHEST - 2 VIEW COMPARISON:  11/26/2018 chest radiograph and prior. FINDINGS: The heart size and mediastinal contours are within normal limits. Bibasilar atelectasis. No pneumothorax or pleural effusion. Multilevel spondylosis. Ossification of the anterior longitudinal ligament. IMPRESSION: Bibasilar atelectasis.  No focal airspace disease. Electronically Signed   By: Primitivo Gauze M.D.   On: 11/05/2019 16:02    No results found.  DG Chest 2 View  Result Date: 11/05/2019 CLINICAL DATA:  Chest tightness EXAM: CHEST - 2 VIEW COMPARISON:  11/26/2018 chest radiograph and prior. FINDINGS: The heart size and mediastinal contours are within normal limits. Bibasilar atelectasis. No pneumothorax or pleural effusion. Multilevel spondylosis. Ossification of the anterior longitudinal ligament. IMPRESSION: Bibasilar atelectasis.  No focal airspace disease. Electronically Signed   By: Primitivo Gauze M.D.   On: 11/05/2019 16:02      Assessment and Plan: Patient Active Problem List   Diagnosis Date Noted  . S/P laparoscopic sleeve gastrectomy October 2021 10/26/2019  . B12 deficiency 10/21/2019  . OA (osteoarthritis)  06/23/2018  . History of pulmonary embolus (PE) 06/23/2018  . CHF (congestive heart failure), NYHA class II, chronic, diastolic ([place]) 63/87/5643  . PAF (paroxysmal atrial fibrillation) ([place]) 03/30/2015  . Peroneal DVT (deep venous thrombosis) ([place])   . Hypertension 08/17/2011  . GERD (gastroesophageal reflux disease) 08/17/2011  . OSA (obstructive sleep apnea) 08/16/2011      The patient does tolerate PAP and reports significant benefit from PAP use. The patient was reminded how to clean the CPAP and advised to let us know if the pressure feels too high as his weight comes down. The patient was also counselled on continuing his efforts at weight loss. He should try the xylimelts for his dry mouth. He tried a chin strap in the past His humidifier is at max. The compliance is excellent. The apnea is well controlled.   1. OSA- continue excellent compliance. Follow up in one year 2. CPAP couseling-Discussed importance of adequate CPAP use as well as proper care and cleaning techniques of machine and all supplies. 3. GERD: Symptoms remain stable at this time, continue with current therapy and routine monitoring. 4. Chronic CHF-appears stable at this time, no evidence  of volume overload, continue with prescribed medications as well as PCP/cardiology follow-up as indicated 5. Morbid obesity-S/p bariatric surgery, continue with surgeons guidelines as well as healthy eating habits and exercised as approved by surgeon and tolerability  General Counseling: I have discussed the findings of the evaluation and examination with Richard [name].  I have also discussed any further diagnostic evaluation thatmay be needed or ordered today. Richard [name] verbalizes understanding of the findings of todays visit. We also reviewed his medications today and discussed drug interactions and side effects including but not limited excessive drowsiness and altered mental states. We also discussed that there is always a risk not just to  him but also people around him. he has been encouraged to call the office with any questions or concerns that should arise related to todays visit.   I have personally obtained a history, examined the patient, evaluated laboratory and imaging results, formulated the assessment and plan and placed orders.  This patient was seen by Theodoro Grist AGNP-C in Collaboration with Dr. Devona Konig as a part of collaborative care agreement.  Richelle Ito Saunders Glance, PhD, FAASM  Diplomate, American Board of Sleep Medicine    Allyne Gee, MD [facility] Diplomate ABMS Pulmonary and Critical Care Medicine Sleep medicine

## 2019-11-09 ENCOUNTER — Encounter: Payer: PRIVATE HEALTH INSURANCE | Attending: Surgery | Admitting: Skilled Nursing Facility1

## 2019-11-09 ENCOUNTER — Other Ambulatory Visit: Payer: Self-pay

## 2019-11-09 DIAGNOSIS — E669 Obesity, unspecified: Secondary | ICD-10-CM | POA: Diagnosis not present

## 2019-11-09 NOTE — Progress Notes (Signed)
2 Week Post-Operative Nutrition Class   Patient was seen on 03/17/18 for Post-Operative Nutrition education at [facility]    Surgery date: 10/26/2019 Surgery type: sleeve Weight today: 281  Beans: [employer] Black  Aim for 3 ounces or protein per meal, be sure to eat 3 meals a day  No bread No greens No applesauce No green beans No fruit No salad  Do not fry anything  Too full could feel like: pressure at the top of stomach, runny nose, box of rocks in the stomach, vomiting  Do not drink ANYTHING with any foods Call your doctor today about your blood thinner Google "diabetes food hub" for baked fish recipe      The following the learning objectives were met by the patient during this course:  Identifies Phase 3 (Soft, High Proteins) Dietary Goals and will begin from 2 weeks post-operatively to 2 months post-operatively  Identifies appropriate sources of fluids and proteins   Identifies appropriate fat sources and healthy verses unhealthy fat types    States protein recommendations and appropriate sources post-operatively  Identifies the need for appropriate texture modifications, mastication, and bite sizes when consuming solids  Identifies appropriate multivitamin and calcium sources post-operatively  Describes the need for physical activity post-operatively and will follow MD recommendations  States when to call healthcare provider regarding medication questions or post-operative complications   Handouts given during class include:  Phase 3A: Soft, High Protein Diet Handout  Phase 3 High Protein Meals  Healthy Fats   Follow-Up Plan: Patient will follow-up at [facility] in 6 weeks for 2 month post-op nutrition visit for diet advancement per MD.

## 2019-11-10 ENCOUNTER — Telehealth: Payer: Self-pay | Admitting: Skilled Nursing Facility1

## 2019-11-10 NOTE — Telephone Encounter (Signed)
Pt called asking how many grams of protein and how many ounces he was supposed to eat.   Pt is having trouble understanding the concept of counting the ounces to meet his needs.   Dietitian walked him through it with detailed examples.

## 2019-11-15 ENCOUNTER — Ambulatory Visit: Payer: No Typology Code available for payment source

## 2019-11-16 ENCOUNTER — Telehealth: Payer: Self-pay | Admitting: Dietician

## 2019-11-16 NOTE — Telephone Encounter (Signed)
I spoke with patient via telephone to assess fluid intake and food tolerance since diet advancement to solid protein foods on 11/10/2019.  Surgery Date: 10/26/2019 Surgery Type: Sleeve  Daily fluid Intake: ~40 ounces Daily protein intake: ~80 grams  Patient states he focuses on chewing his food very well before swallowing and takes tiny bites. States he would like to try different types of beans. Typical foods eaten include eggs with cheese, grilled chicken, and pinto beans. May also have protein shakes. Tolerates foods and fluids well, however states it is much harder to get his fluids in now that he is also eating solid foods. Drinks include water and Gatorade Zero.     Nat Christen [place]) [place], MS, RD, LDN

## 2019-11-24 ENCOUNTER — Ambulatory Visit (INDEPENDENT_AMBULATORY_CARE_PROVIDER_SITE_OTHER): Payer: PRIVATE HEALTH INSURANCE | Admitting: *Deleted

## 2019-11-24 ENCOUNTER — Telehealth: Payer: Self-pay | Admitting: Internal Medicine

## 2019-11-24 DIAGNOSIS — E538 Deficiency of other specified B group vitamins: Secondary | ICD-10-CM | POA: Diagnosis not present

## 2019-11-24 MED ORDER — CYANOCOBALAMIN 1000 MCG/ML IJ SOLN
1000.0000 ug | Freq: Once | INTRAMUSCULAR | Status: AC
  Administered 2019-11-24: 1000 ug via INTRAMUSCULAR

## 2019-11-24 NOTE — Telephone Encounter (Signed)
Patient called in stating he was told by surgeon to stop taking b-12 pills. Pt is wondering when he should start taking the pills again or if he needs to take it. Please advise.

## 2019-11-24 NOTE — Telephone Encounter (Signed)
Would need to know why the surgeon advised him to stop. He should contact them and find out.

## 2019-11-24 NOTE — Progress Notes (Signed)
Per orders of Webb Silversmith, NP, injection of Vit B12 1085mcg given by Jamichael Knotts M in left deltoid. Patient tolerated injection well.

## 2019-11-25 NOTE — Telephone Encounter (Addendum)
Spoke to pt. He will find out and let us know if he needs to take the B12 tablets.

## 2019-11-29 ENCOUNTER — Telehealth: Payer: Self-pay | Admitting: Skilled Nursing Facility1

## 2019-11-29 NOTE — Telephone Encounter (Signed)
returned missed call.  LVM

## 2019-11-29 NOTE — Telephone Encounter (Signed)
Pt called stating he feels a sharp pain after he eats and has extremely dry mouth (drinking 10 16.9 bottles of water plus Gatorade zero).   Pt states he chews his foods well and his chicken is very moist. Pt states he measures his proteins to 3 ounces after cooking and can only get to 1.5 ounces before he feels the sharp pains stating he feels it is going to come back up. Pt states he is waiting 15 minutes before he eats and 30 minutes after he eats before drinking again. Pt states he ate a salad and that went well with no problems.    Dietitian advised he try more soups until he gets in touch with his surgeon as it does not seem there is anything in your behaviors with foods causing it. Pt states he has never had dry mouth this bad before.

## 2019-12-01 ENCOUNTER — Ambulatory Visit: Payer: PRIVATE HEALTH INSURANCE | Admitting: Internal Medicine

## 2019-12-06 ENCOUNTER — Ambulatory Visit (INDEPENDENT_AMBULATORY_CARE_PROVIDER_SITE_OTHER): Payer: PPO | Admitting: Primary Care

## 2019-12-06 ENCOUNTER — Ambulatory Visit
Admission: RE | Admit: 2019-12-06 | Discharge: 2019-12-06 | Disposition: A | Discharge status: Home / Self Care | Payer: PRIVATE HEALTH INSURANCE | Source: Ambulatory Visit | Attending: Primary Care | Admitting: Primary Care

## 2019-12-06 ENCOUNTER — Telehealth: Payer: Self-pay

## 2019-12-06 ENCOUNTER — Other Ambulatory Visit: Payer: Self-pay

## 2019-12-06 VITALS — BP 132/78 | HR 75 | Temp 97.8°F | Resp 16 | Ht 69.0 in | Wt 264.0 lb

## 2019-12-06 DIAGNOSIS — R319 Hematuria, unspecified: Secondary | ICD-10-CM | POA: Diagnosis not present

## 2019-12-06 DIAGNOSIS — Z8052 Family history of malignant neoplasm of bladder: Secondary | ICD-10-CM | POA: Diagnosis not present

## 2019-12-06 LAB — POCT URINALYSIS DIP (MANUAL ENTRY)
Glucose, UA: NEGATIVE mg/dL
Nitrite, UA: NEGATIVE
Protein Ur, POC: 100 mg/dL — AB
Spec Grav, UA: 1.025 (ref 1.010–1.025)
Urobilinogen, UA: 0.2 E.U./dL
pH, UA: 6 (ref 5.0–8.0)

## 2019-12-06 LAB — URINALYSIS, MICROSCOPIC ONLY

## 2019-12-06 NOTE — Telephone Encounter (Signed)
Patient evaluated.  

## 2019-12-06 NOTE — Telephone Encounter (Signed)
[facility] - Client TELEPHONE ADVICE RECORD [facility] Patient Name: Richard [name] Gender: Male DOB: 01/04/1961 Age: 59 Y 9 M 19 D Return Phone Number: [phone] (Primary), [phone] (Secondary) Address[place] [address] Client [facility] - Client Client Site [place] - Day Physician Webb Silversmith - NP Contact Type Call Who Is Calling Patient / Member / Family / Caregiver Call Type Triage / Clinical Relationship To Patient Self Return Phone Number [phone] (Primary) Chief Complaint Urine, Blood In Reason for Call Symptomatic / Request for [place] states she states her name Shirlean Mylar and needs a pt triaged from the office. Groin pain x 2 months, urine looks like chocolate milk, pt states pushing on lower abdomen on left is sensitive. Bariatric surgery last month, but was hurting before surgery. No pain with urination. Pt states urine cleared up after a bit of urination. Xrays 2 months ago all is was well. Translation No Nurse Assessment Nurse: Sherrell Puller, RN, Amy Date/Time Eilene Ghazi Time): 12/06/2019 9:48:40 AM Confirm and document reason for call. If symptomatic, describe symptoms. ---Caller states his urine was brown this morning but he went again and now it's clearing up. No pain with urination. Says he's been having left-sided abdominal pain. Groin pain for the last 2 months-went to doctor about it already. X-ray done and was told nothing was wrong. Bariatric surgery last month. No fever. Does the patient have any new or worsening symptoms? ---Yes Will a triage be completed? ---Yes Related visit to physician within the last 2 weeks? ---No Does the PT have any chronic conditions? (i.e. diabetes, asthma, this includes High risk factors for pregnancy, etc.) ---Yes List chronic conditions. ---HTN, A-FIB Is this a behavioral health or substance  abuse call? ---No Guidelines Guideline Title Affirmed Question Affirmed Notes Nurse Date/Time (Eastern Time) Urine - Blood In Taking Coumadin (warfarin) or other strong blood thinner, or known bleeding disorder (e.g., thrombocytopenia) Sherrell Puller, RN, Amy 12/06/2019 9:53:27 AM PLEASE NOTE: All timestamps contained within this report are represented as [place] Standard Time. CONFIDENTIALTY NOTICE: This fax transmission is intended only for the addressee. It contains information that is legally privileged, confidential or otherwise protected from use or disclosure. If you are not the intended recipient, you are strictly prohibited from reviewing, disclosing, copying using or disseminating any of this information or taking any action in reliance on or regarding this information. If you have received this fax in error, please notify us immediately by telephone so that we can arrange for its return to [place]. Phone: [phone], Toll-Free: [phone], Fax: [phone] Page: 2 of 2 Call Id: [id number] [place]. Time Eilene Ghazi Time) Disposition Final User 12/06/2019 10:01:55 AM See HCP within 4 Hours (or PCP triage) Yes Sherrell Puller, RN, Amy Caller Disagree/Comply Comply Caller Understands Yes PreDisposition Did not know what to do Care Advice Given Per Guideline SEE HCP (OR PCP TRIAGE) WITHIN 4 HOURS: * IF OFFICE WILL BE OPEN: You need to be seen within the next 3 or 4 hours. Call your doctor (or NP/PA) now or as soon as the office opens. BRING MEDICINES: * Please bring a list of your current medicines when you go to see the doctor. CALL BACK IF: * Fever occurs * You become worse CARE ADVICE given per Urine, Blood In (Adult) guideline. This nurse called backline and spoke to [place] at the office. Appointment scheduled for patient at noon today with Alma Friendly, NP. Patient is aware. Referrals REFERRED TO  PCP OFFICE

## 2019-12-06 NOTE — Patient Instructions (Signed)
Stop by the front desk and speak with either Ashtyn regarding your CT scan.   Stop by the front desk and speak with either Ashtyn regarding your CT scan.  I will be in touch with your urine culture results once received.   It was a pleasure to see you today!

## 2019-12-06 NOTE — Telephone Encounter (Signed)
Per appt notes pt has appt 12/06/19 at 12 noon with Gentry Fitz NP.

## 2019-12-06 NOTE — Progress Notes (Signed)
Subjective:    Patient ID: Richard [name], male    DOB: 09/05/1960, 59 y.o.   MRN: [id number]  HPI  This visit occurred during the SARS-CoV-2 public health emergency.  Safety protocols were in place, including screening questions prior to the visit, additional usage of staff PPE, and extensive cleaning of exam room while observing appropriate contact time as indicated for disinfecting solutions.   Richard [name] is a 59 year old male patient of Webb Silversmith with a history of hypertension, renal stones, DVT, CHF, OSA, GERD, pulmonary embolism (not on anticoagulation) who presents today with a chief complaint of hematuria.   This morning when waking he noticed a brown discoloration to his urine while urinating.  The brown discoloration had dissipated by the end of urination at the time.  About one week ago he began to notice that his urine color has been orange. Today when providing [place] with a urine sample he noticed bright red bleeding. He has noticed a pain to the left groin for a few days. About 6 months ago he fell backwards, landed on his tailbone, and has since had intermittent pelvic pain. He underwent plain films at the time of his fall which were negative.   He underwent gastric sleeve surgery October 5th 2021, has lost 40 pounds since. His mother recently passed away from lung cancer. His father has a history of bladder cancer, is under treatment for this.   He denies dysuria, difficulty urinating, increased back pain, penile symptoms, flank pain. He has noticed increased urination due to recent gastric sleeve surgery and increased water requirements.  He is a non-smoker.  He has a history of renal stones in the past which were painless.  He denies new prescription medications, taking AZO.  Wt Readings from Last 3 Encounters:  12/06/19 264 lb (119.7 kg)  11/09/19 281 lb (127.5 kg)  11/08/19 283 lb (128.4 kg)     Review of Systems  Constitutional: Negative for fever.  Gastrointestinal:  Negative for abdominal pain and nausea.  Genitourinary: Positive for hematuria. Negative for discharge, dysuria, flank pain, frequency and urgency.       Left groin pain       Past Medical History:  Diagnosis Date  . Arthritis   . Arthrofibrosis of total knee arthroplasty ([place]) 05/30/2015  . Bilateral pulmonary embolism ([place])    a. 03/2015 CTA: acute bilat PE; b. IVC filter placed in 2017--> removed 2018.  Marland Kitchen Chest pain    a. 2015 Abnl MV;  b. 2015 Cath: nl cors.  . Chronic diastolic CHF (congestive heart failure) ([place])    a. 03/2015 Echo: EF 55-65%, poor windows, mildly dil RV.  Marland Kitchen DVT (deep venous thrombosis) ([place])    a. 03/2015 U/S: occlusive DVT w/in the distal aspect of the Left fem vein through the L popliteal vein.  Marland Kitchen Dyspnea    On exertion  . Dysrhythmia    afib   . GERD (gastroesophageal reflux disease)   . Headache   . Hypertension   . Hypertensive heart disease   . Obesity   . OSA (obstructive sleep apnea)    uses CPAP nightly settings at 15   . PAF (paroxysmal atrial fibrillation) ([place])   . Pneumonia    hx of years ago   . Primary localized osteoarthritis of left knee    a. 11/2014 s/p L TKA.  . Primary localized osteoarthritis of right knee    a. 02/2014 s/p R Partial Knee arthroplasty.  . Stiffness  of left knee    a. 12/2014 s/p manipulation under anesthesia.  . Urine incontinence      Social History   Socioeconomic History  . Marital status: Married    Spouse name: Not on file  . Number of children: 2  . Years of education: 68  . Highest education level: Not on file  Occupational History  . Occupation: Energy manager: [employer]  Tobacco Use  . Smoking status: Never Smoker  . Smokeless tobacco: Never Used  Vaping Use  . Vaping Use: Never used  Substance and Sexual Activity  . Alcohol use: Not Currently    Comment: occassional  . Drug use: No  . Sexual activity: Yes  Other Topics Concern  . Not on file  Social History Narrative  . Not on file     Social Determinants of Health   Financial Resource Strain:   . Difficulty of Paying Living Expenses: Not on file  Food Insecurity:   . Worried About Charity fundraiser in the Last Year: Not on file  . Ran Out of Food in the Last Year: Not on file  Transportation Needs:   . Lack of Transportation (Medical): Not on file  . Lack of Transportation (Non-Medical): Not on file  Physical Activity:   . Days of Exercise per Week: Not on file  . Minutes of Exercise per Session: Not on file  Stress:   . Feeling of Stress : Not on file  Social Connections:   . Frequency of Communication with Friends and Family: Not on file  . Frequency of Social Gatherings with Friends and Family: Not on file  . Attends Religious Services: Not on file  . Active Member of Clubs or Organizations: Not on file  . Attends Archivist Meetings: Not on file  . Marital Status: Not on file  Intimate Partner Violence:   . Fear of Current or Ex-Partner: Not on file  . Emotionally Abused: Not on file  . Physically Abused: Not on file  . Sexually Abused: Not on file    Past Surgical History:  Procedure Laterality Date  . APPENDECTOMY    . CARDIAC CATHETERIZATION    . CARPAL TUNNEL RELEASE Left 03/2018  . EXAM UNDER ANESTHESIA WITH MANIPULATION OF KNEE Left 05/30/2015   Procedure: EXAM UNDER ANESTHESIA WITH MANIPULATION OF LEFT KNEE;  Surgeon: Marchia Bond, MD;  Location: [facility];  Service: Orthopedics;  Laterality: Left;  . HIATAL HERNIA REPAIR N/A 10/26/2019   Procedure: HIATAL HERNIA REPAIR;  Surgeon: Johnathan Hausen, MD;  Location: [facility];  Service: General;  Laterality: N/A;  . I & D KNEE WITH POLY EXCHANGE Left 10/30/2015   Procedure: IRRIGATION AND DEBRIDEMENT KNEE WITH POLY EXCHANGE PLACE SPACERS;  Surgeon: Frederik Pear, MD;  Location: [facility];  Service: Orthopedics;  Laterality: Left;  OFF ELIQUIST 3 DAYS  . IVC FILTER PLACEMENT ([facility] HX)  04/13/15  . IVC FILTER REMOVAL N/A 08/13/2016   Procedure: IVC  Filter Removal;  Surgeon: Katha Cabal, MD;  Location: [facility];  Service: Cardiovascular;  Laterality: N/A;  . JOINT REPLACEMENT    . KNEE ARTHROSCOPY Left 08/08/2015   Procedure: LEFT KNEE MANIPULATION WITH ARTHROSCOPIC LYSIS OF ADHESIONS AND ASPIRATION;  Surgeon: Marchia Bond, MD;  Location: [place];  Service: Orthopedics;  Laterality: Left;  . KNEE CLOSED REDUCTION Left 01/20/2015   Procedure: LEFT KNEE MANIPULATION;  Surgeon: Marchia Bond, MD;  Location: [facility];  Service: Orthopedics;  Laterality: Left;  . LAPAROSCOPIC GASTRIC SLEEVE RESECTION N/A 10/26/2019   Procedure: LAPAROSCOPIC SLEEVE GASTRECTOMY;  Surgeon: Johnathan Hausen, MD;  [facility];  Service: General;  Laterality: N/A;  . LEFT HEART CATHETERIZATION WITH CORONARY ANGIOGRAM N/A 01/12/2014   Procedure: LEFT HEART CATHETERIZATION WITH CORONARY ANGIOGRAM;  Surgeon: Jettie Booze, MD;  [facility];  Service: Cardiovascular;  Laterality: N/A;  . ORTHOPEDIC SURGERY Left    arthroscopy x3  . PARTIAL KNEE ARTHROPLASTY Right 02/25/2014   Procedure: RIGHT KNEE ARTHROPLASTY CONDYLE AND PLATEAU MEDIAL COMPARTMENT ;  Surgeon: Johnny Bridge, MD;  Location: [place];  Service: Orthopedics;  Laterality: Right;  . PERIPHERAL VASCULAR CATHETERIZATION N/A 03/29/2015   Procedure: IVC Filter Insertion, and possible thrombectomy;  Surgeon: Katha Cabal, MD;  Location: [place] [facility];  Service: Cardiovascular;  Laterality: N/A;  . ROTATOR CUFF REPAIR Left   . TOTAL KNEE ARTHROPLASTY  12/06/2014   Procedure: TOTAL KNEE ARTHROPLASTY;  Surgeon: Marchia Bond, MD;  Location: [place];  Service: Orthopedics;;  . TOTAL KNEE REVISION Left 02/08/2016   Procedure: TOTAL KNEE REVISION;  Surgeon: Frederik Pear, MD;  Location: [facility];  Service: Orthopedics;  Laterality: Left;  . UPPER GI ENDOSCOPY N/A 10/26/2019   Procedure: UPPER GI ENDOSCOPY;  Surgeon: Johnathan Hausen, MD;  [facility];  Service: General;  Laterality: N/A;    Family History  Problem Relation Age of Onset  . Coronary artery disease Mother   . Hyperlipidemia Mother   . Hypertension Mother   . Arthritis Mother   . Coronary artery disease Father   . Heart disease Paternal Grandmother   . Alzheimer's disease Paternal Grandfather     Allergies  Allergen Reactions  . Penicillins Anaphylaxis and Other (See Comments)    From childhood; uncertain of reaction Has patient had a PCN reaction causing immediate rash, facial/tongue/throat swelling, SOB or lightheadedness with hypotension: Unk Has patient had a PCN reaction causing severe rash involving mucus membranes or skin necrosis: Unk Has patient had a PCN reaction that required hospitalization: Unk Has patient had a PCN reaction occurring within the last 10 years: No If all of the above answers are "NO", then may proceed with Cephalosporin use.  . Vancomycin Other (See Comments)    [place]' syndrome    Current Outpatient Medications on File Prior to Visit  Medication Sig Dispense Refill  . acetaminophen (TYLENOL) 500 MG tablet Take 1,000 mg by mouth every 6 (six) hours as needed for moderate pain.    . baclofen (LIORESAL) 10 MG tablet TAKE 1 TO 2 TABLETS BY MOUTH 3 TIMES DAILY AS NEEDED FOR MUSCLE SPASMS (Patient taking differently: Take 10 mg by mouth 3 (three) times daily as needed for muscle spasms. ) 180 each 0  . cyanocobalamin (,VITAMIN B-12,) 1000 MCG/ML injection Inject 1,000 mcg into the muscle every 30 (thirty) days.     . flecainide (TAMBOCOR) 100 MG tablet TAKE 1 TABLET BY MOUTH TWICE (2) DAILY (Patient taking differently: Take 100 mg by mouth 2 (two) times daily. ) 180 tablet 3  . metoprolol succinate (TOPROL-XL) 25 MG 24 hr tablet TAKE 1 TABLET BY MOUTH ONCE A DAY WITH OR IMMEDIATELY FOLLOWING A MEAL (Patient taking differently: Take 25 mg by mouth daily. ) 90 tablet 3  . ondansetron (ZOFRAN-ODT) 4 MG disintegrating tablet Take 1 tablet  (4 mg total) by mouth every 6 (six) hours as needed for nausea or vomiting. 20 tablet 0  . oxyCODONE (  OXY IR/ROXICODONE) 5 MG immediate release tablet Take 1 tablet (5 mg total) by mouth every 6 (six) hours as needed for severe pain. 10 tablet 0  . pantoprazole (PROTONIX) 40 MG tablet Take 1 tablet (40 mg total) by mouth daily. 90 tablet 0  . Sennosides-Docusate Sodium (SM STOOL SOFTENER/LAXATIVE PO) Take 1 tablet by mouth daily as needed (constipation.).     Marland Kitchen enoxaparin (LOVENOX) 40 MG/0.4ML injection Inject 0.4 mLs (40 mg total) into the skin 2 (two) times daily for 14 days. 11.2 mL 0  . vitamin B-12 (CYANOCOBALAMIN) 500 MCG tablet Take 500 mcg by mouth daily.  (Patient not taking: Reported on 12/06/2019)    . vitamin B-12 (CYANOCOBALAMIN) 500 MCG tablet Take 500 mcg by mouth daily. (Patient not taking: Reported on 12/06/2019)     Current Facility-Administered Medications on File Prior to Visit  Medication Dose Route Frequency Provider Last Rate Last Admin  . cyanocobalamin ((VITAMIN B-12)) injection 1,000 mcg  1,000 mcg Intramuscular Once Baity, Regina W, NP        BP 132/78   Pulse 75   Temp 97.8 F (36.6 C) (Temporal)   Resp 16   Ht 5\' 9"  (1.753 m)   Wt 264 lb (119.7 kg)   SpO2 98%   BMI 38.99 kg/m    Objective:   Physical Exam Pulmonary:     Effort: Pulmonary effort is normal.  Abdominal:     General: Abdomen is flat.     Palpations: Abdomen is soft.     Tenderness: There is no abdominal tenderness. There is no right CVA tenderness or left CVA tenderness.       Comments: Nontender, but mild pain to left groin  Neurological:     Mental Status: He is alert.            Assessment & Plan:

## 2019-12-06 NOTE — Assessment & Plan Note (Signed)
Suspect hematuria could have been present for the last 1 week, gross hematuria this afternoon.  UA today with 3+ blood, trace leuks, negative nitrites. Urine culture and microscopic sent for evaluation.  Given family history, we need to rule out evidence of malignancy.  He does have a history of painless renal stones.   Stat CT renal stone study ordered and pending.

## 2019-12-07 ENCOUNTER — Telehealth: Payer: Self-pay

## 2019-12-07 DIAGNOSIS — N200 Calculus of kidney: Secondary | ICD-10-CM

## 2019-12-07 DIAGNOSIS — R319 Hematuria, unspecified: Secondary | ICD-10-CM

## 2019-12-07 DIAGNOSIS — Z8052 Family history of malignant neoplasm of bladder: Secondary | ICD-10-CM

## 2019-12-07 NOTE — Telephone Encounter (Signed)
His kidneys stone is located inside the kidney, which shouldn't cause pain, but given the size, it could be stuck requiring removal.   I recommend we send him to Urology for evaluation, especially given the size of the stone.  Is he willing to go? Rollene Fare and I spoke about this today and we both agree Urology.

## 2019-12-07 NOTE — Telephone Encounter (Signed)
Noted, there is no evidence of renal stone to the ureters. Without an updated renal function test I am reticent to provide him with NSAIDs.  Would recommend extra strength Tylenol at 1000 mg every 8 hours as needed for pain.  We will be in touch on 11/17 when received his urine culture, that will provide [place] with some additional information.

## 2019-12-07 NOTE — Telephone Encounter (Signed)
Pt left v/m that he saw Gentry Fitz NP on 12/06/19 and pt had CT for renal stone on 12/06/19; pt said the CT showed kidney stone and pt is having abd pain and wants to know what to do next. Pt request cb.

## 2019-12-07 NOTE — Telephone Encounter (Signed)
Patient would like referral to Urology. He would like to be seen in [place]. Patient states that he is in a lot of pain in groin area and wanted to know if he could get something for pain. He is having lower abdominal pain and would like to know if he can have something sent in for pain.

## 2019-12-08 ENCOUNTER — Other Ambulatory Visit: Payer: Self-pay | Admitting: Primary Care

## 2019-12-08 DIAGNOSIS — N3001 Acute cystitis with hematuria: Secondary | ICD-10-CM

## 2019-12-08 LAB — URINE CULTURE
MICRO NUMBER:: 11203696
SPECIMEN QUALITY:: ADEQUATE

## 2019-12-08 MED ORDER — SULFAMETHOXAZOLE-TRIMETHOPRIM 800-160 MG PO TABS: 1.0000 | ORAL_TABLET | Freq: Two times a day (BID) | ORAL | 0 refills | Status: AC

## 2019-12-08 NOTE — Telephone Encounter (Signed)
Called patient let him know all instructions and had repeat back to me. If no call received from urology by next week he will let our office know. Informed we will call once results have been received.

## 2019-12-09 ENCOUNTER — Encounter
Admission: RE | Disposition: A | Discharge status: Home / Self Care | Payer: Self-pay | Source: Home / Self Care | Attending: Urology

## 2019-12-09 ENCOUNTER — Ambulatory Visit: Payer: PRIVATE HEALTH INSURANCE

## 2019-12-09 ENCOUNTER — Ambulatory Visit: Payer: PRIVATE HEALTH INSURANCE | Admitting: Registered Nurse

## 2019-12-09 ENCOUNTER — Ambulatory Visit
Admission: RE | Admit: 2019-12-09 | Discharge: 2019-12-09 | Disposition: A | Discharge status: Home / Self Care | Payer: PRIVATE HEALTH INSURANCE | Attending: Urology | Admitting: Urology

## 2019-12-09 ENCOUNTER — Encounter: Payer: Self-pay | Admitting: Urology

## 2019-12-09 ENCOUNTER — Ambulatory Visit
Admission: RE | Admit: 2019-12-09 | Discharge: 2019-12-09 | Disposition: A | Discharge status: Home / Self Care | Payer: PRIVATE HEALTH INSURANCE | Source: Ambulatory Visit | Admitting: Urology

## 2019-12-09 DIAGNOSIS — Z8249 Family history of ischemic heart disease and other diseases of the circulatory system: Secondary | ICD-10-CM | POA: Diagnosis not present

## 2019-12-09 DIAGNOSIS — Z7901 Long term (current) use of anticoagulants: Secondary | ICD-10-CM | POA: Diagnosis not present

## 2019-12-09 DIAGNOSIS — R310 Gross hematuria: Secondary | ICD-10-CM | POA: Diagnosis not present

## 2019-12-09 DIAGNOSIS — Z881 Allergy status to other antibiotic agents status: Secondary | ICD-10-CM | POA: Insufficient documentation

## 2019-12-09 DIAGNOSIS — Z86711 Personal history of pulmonary embolism: Secondary | ICD-10-CM | POA: Diagnosis not present

## 2019-12-09 DIAGNOSIS — Z6838 Body mass index (BMI) 38.0-38.9, adult: Secondary | ICD-10-CM | POA: Insufficient documentation

## 2019-12-09 DIAGNOSIS — I110 Hypertensive heart disease with heart failure: Secondary | ICD-10-CM | POA: Insufficient documentation

## 2019-12-09 DIAGNOSIS — Z20822 Contact with and (suspected) exposure to covid-19: Secondary | ICD-10-CM | POA: Insufficient documentation

## 2019-12-09 DIAGNOSIS — E669 Obesity, unspecified: Secondary | ICD-10-CM | POA: Diagnosis not present

## 2019-12-09 DIAGNOSIS — Z79899 Other long term (current) drug therapy: Secondary | ICD-10-CM | POA: Diagnosis not present

## 2019-12-09 DIAGNOSIS — N136 Pyonephrosis: Secondary | ICD-10-CM | POA: Insufficient documentation

## 2019-12-09 DIAGNOSIS — G4733 Obstructive sleep apnea (adult) (pediatric): Secondary | ICD-10-CM | POA: Diagnosis not present

## 2019-12-09 DIAGNOSIS — I480 Paroxysmal atrial fibrillation: Secondary | ICD-10-CM | POA: Insufficient documentation

## 2019-12-09 DIAGNOSIS — Z86718 Personal history of other venous thrombosis and embolism: Secondary | ICD-10-CM | POA: Diagnosis not present

## 2019-12-09 DIAGNOSIS — Z820 Family history of epilepsy and other diseases of the nervous system: Secondary | ICD-10-CM | POA: Insufficient documentation

## 2019-12-09 DIAGNOSIS — I5032 Chronic diastolic (congestive) heart failure: Secondary | ICD-10-CM | POA: Insufficient documentation

## 2019-12-09 DIAGNOSIS — K219 Gastro-esophageal reflux disease without esophagitis: Secondary | ICD-10-CM | POA: Insufficient documentation

## 2019-12-09 DIAGNOSIS — Z8349 Family history of other endocrine, nutritional and metabolic diseases: Secondary | ICD-10-CM | POA: Insufficient documentation

## 2019-12-09 DIAGNOSIS — Z880 Allergy status to penicillin: Secondary | ICD-10-CM | POA: Diagnosis not present

## 2019-12-09 DIAGNOSIS — N201 Calculus of ureter: Secondary | ICD-10-CM | POA: Diagnosis not present

## 2019-12-09 DIAGNOSIS — Z8261 Family history of arthritis: Secondary | ICD-10-CM | POA: Diagnosis not present

## 2019-12-09 DIAGNOSIS — Z9884 Bariatric surgery status: Secondary | ICD-10-CM | POA: Diagnosis not present

## 2019-12-09 DIAGNOSIS — G4730 Sleep apnea, unspecified: Secondary | ICD-10-CM | POA: Diagnosis not present

## 2019-12-09 DIAGNOSIS — N390 Urinary tract infection, site not specified: Secondary | ICD-10-CM | POA: Diagnosis not present

## 2019-12-09 DIAGNOSIS — N23 Unspecified renal colic: Secondary | ICD-10-CM | POA: Diagnosis not present

## 2019-12-09 HISTORY — PX: CYSTOSCOPY W/ RETROGRADES: SHX1426

## 2019-12-09 HISTORY — PX: CYSTOSCOPY WITH STENT PLACEMENT: SHX5790

## 2019-12-09 LAB — SARS CORONAVIRUS 2 BY RT PCR (HOSPITAL ORDER, PERFORMED IN ~~LOC~~ HOSPITAL LAB): SARS Coronavirus 2: NEGATIVE

## 2019-12-09 SURGERY — CYSTOSCOPY, WITH STENT INSERTION
Anesthesia: General | Laterality: Right

## 2019-12-09 MED ORDER — SUGAMMADEX SODIUM 200 MG/2ML IV SOLN
INTRAVENOUS | Status: DC | PRN
  Administered 2019-12-09: 400 mg via INTRAVENOUS

## 2019-12-09 MED ORDER — ONDANSETRON HCL 4 MG/2ML IJ SOLN
INTRAMUSCULAR | Status: DC | PRN
  Administered 2019-12-09: 4 mg via INTRAVENOUS

## 2019-12-09 MED ORDER — SUCCINYLCHOLINE CHLORIDE 200 MG/10ML IV SOSY
PREFILLED_SYRINGE | INTRAVENOUS | Status: AC
  Filled 2019-12-09: qty 10

## 2019-12-09 MED ORDER — ORAL CARE MOUTH RINSE: 15.0000 mL | Freq: Once | OROMUCOSAL | Status: DC

## 2019-12-09 MED ORDER — HYOSCYAMINE SULFATE SL 0.125 MG SL SUBL: 0.1250 mg | SUBLINGUAL_TABLET | SUBLINGUAL | 3 refills | Status: DC | PRN

## 2019-12-09 MED ORDER — LEVOFLOXACIN IN D5W 500 MG/100ML IV SOLN
INTRAVENOUS | Status: DC | PRN
  Administered 2019-12-09: 500 mg via INTRAVENOUS

## 2019-12-09 MED ORDER — FENTANYL CITRATE (PF) 100 MCG/2ML IJ SOLN
25.0000 ug | INTRAMUSCULAR | Status: DC | PRN
  Administered 2019-12-09 (×2): 25 ug via INTRAVENOUS

## 2019-12-09 MED ORDER — LIDOCAINE HCL (CARDIAC) PF 100 MG/5ML IV SOSY
PREFILLED_SYRINGE | INTRAVENOUS | Status: DC | PRN
  Administered 2019-12-09: 100 mg via INTRAVENOUS

## 2019-12-09 MED ORDER — BELLADONNA ALKALOIDS-OPIUM 16.2-60 MG RE SUPP
RECTAL | Status: AC
  Filled 2019-12-09: qty 1

## 2019-12-09 MED ORDER — PROPOFOL 10 MG/ML IV BOLUS
INTRAVENOUS | Status: DC | PRN
  Administered 2019-12-09: 150 mg via INTRAVENOUS

## 2019-12-09 MED ORDER — SUCCINYLCHOLINE CHLORIDE 20 MG/ML IJ SOLN
INTRAMUSCULAR | Status: DC | PRN
  Administered 2019-12-09: 120 mg via INTRAVENOUS

## 2019-12-09 MED ORDER — DEXAMETHASONE SODIUM PHOSPHATE 10 MG/ML IJ SOLN
INTRAMUSCULAR | Status: DC | PRN
  Administered 2019-12-09: 10 mg via INTRAVENOUS

## 2019-12-09 MED ORDER — ROCURONIUM BROMIDE 100 MG/10ML IV SOLN
INTRAVENOUS | Status: DC | PRN
  Administered 2019-12-09: 25 mg via INTRAVENOUS
  Administered 2019-12-09: 5 mg via INTRAVENOUS

## 2019-12-09 MED ORDER — LIDOCAINE HCL URETHRAL/MUCOSAL 2 % EX GEL
CUTANEOUS | Status: AC
  Filled 2019-12-09: qty 10

## 2019-12-09 MED ORDER — ONDANSETRON HCL 4 MG/2ML IJ SOLN: 4.0000 mg | Freq: Once | INTRAMUSCULAR | Status: DC | PRN

## 2019-12-09 MED ORDER — FENTANYL CITRATE (PF) 100 MCG/2ML IJ SOLN
INTRAMUSCULAR | Status: AC
  Filled 2019-12-09: qty 2

## 2019-12-09 MED ORDER — HYDROCODONE-ACETAMINOPHEN 10-325 MG PO TABS: 1.0000 | ORAL_TABLET | ORAL | 0 refills | Status: DC | PRN

## 2019-12-09 MED ORDER — ONDANSETRON 8 MG PO TBDP: 8.0000 mg | ORAL_TABLET | Freq: Four times a day (QID) | ORAL | 3 refills | Status: DC | PRN

## 2019-12-09 MED ORDER — LIDOCAINE HCL URETHRAL/MUCOSAL 2 % EX GEL
CUTANEOUS | Status: DC | PRN
  Administered 2019-12-09: 1 via URETHRAL

## 2019-12-09 MED ORDER — MIDAZOLAM HCL 2 MG/2ML IJ SOLN
INTRAMUSCULAR | Status: DC | PRN
  Administered 2019-12-09: 2 mg via INTRAVENOUS

## 2019-12-09 MED ORDER — BELLADONNA ALKALOIDS-OPIUM 16.2-60 MG RE SUPP
RECTAL | Status: DC | PRN
  Administered 2019-12-09: 1 via RECTAL

## 2019-12-09 MED ORDER — PROPOFOL 10 MG/ML IV BOLUS
INTRAVENOUS | Status: AC
  Filled 2019-12-09: qty 40

## 2019-12-09 MED ORDER — FENTANYL CITRATE (PF) 100 MCG/2ML IJ SOLN
INTRAMUSCULAR | Status: DC | PRN
  Administered 2019-12-09 (×2): 50 ug via INTRAVENOUS

## 2019-12-09 MED ORDER — CHLORHEXIDINE GLUCONATE 0.12 % MT SOLN: 15.0000 mL | Freq: Once | OROMUCOSAL | Status: DC

## 2019-12-09 MED ORDER — URIBEL 118 MG PO CAPS
1.0000 | ORAL_CAPSULE | Freq: Four times a day (QID) | ORAL | 3 refills | Status: DC | PRN
Start: 2019-12-09 — End: 2020-05-17

## 2019-12-09 MED ORDER — FENTANYL CITRATE (PF) 100 MCG/2ML IJ SOLN
INTRAMUSCULAR | Status: AC
  Administered 2019-12-09: 25 ug via INTRAVENOUS
  Filled 2019-12-09: qty 2

## 2019-12-09 MED ORDER — DEXAMETHASONE SODIUM PHOSPHATE 10 MG/ML IJ SOLN
INTRAMUSCULAR | Status: AC
  Filled 2019-12-09: qty 1

## 2019-12-09 MED ORDER — ONDANSETRON HCL 4 MG/2ML IJ SOLN
INTRAMUSCULAR | Status: AC
  Filled 2019-12-09: qty 2

## 2019-12-09 MED ORDER — LACTATED RINGERS IV SOLN: INTRAVENOUS | Status: DC

## 2019-12-09 MED ORDER — EPHEDRINE 5 MG/ML INJ
INTRAVENOUS | Status: AC
  Filled 2019-12-09: qty 30

## 2019-12-09 MED ORDER — LEVOFLOXACIN IN D5W 500 MG/100ML IV SOLN
INTRAVENOUS | Status: AC
  Filled 2019-12-09: qty 100

## 2019-12-09 MED ORDER — LIDOCAINE HCL (PF) 2 % IJ SOLN
INTRAMUSCULAR | Status: AC
  Filled 2019-12-09: qty 5

## 2019-12-09 MED ORDER — TAMSULOSIN HCL 0.4 MG PO CAPS: 0.4000 mg | ORAL_CAPSULE | Freq: Every day | ORAL | 1 refills | Status: DC

## 2019-12-09 MED ORDER — ACETAMINOPHEN 10 MG/ML IV SOLN
INTRAVENOUS | Status: AC
  Filled 2019-12-09: qty 100

## 2019-12-09 MED ORDER — ROCURONIUM BROMIDE 10 MG/ML (PF) SYRINGE
PREFILLED_SYRINGE | INTRAVENOUS | Status: AC
  Filled 2019-12-09: qty 10

## 2019-12-09 MED ORDER — MIDAZOLAM HCL 2 MG/2ML IJ SOLN
INTRAMUSCULAR | Status: AC
  Filled 2019-12-09: qty 2

## 2019-12-09 SURGICAL SUPPLY — 24 items
BAG DRAIN CYSTO-URO LG1000N (MISCELLANEOUS) ×3 IMPLANT
CATH URETL OPEN END 6X70 (CATHETERS) ×3 IMPLANT
CONRAY 43 FOR UROLOGY 50M (MISCELLANEOUS) IMPLANT
COVER WAND RF STERILE (DRAPES) ×3 IMPLANT
GLOVE BIO SURGEON STRL SZ7 (GLOVE) ×6 IMPLANT
GLOVE BIO SURGEON STRL SZ7.5 (GLOVE) ×3 IMPLANT
GOWN STRL REUS W/ TWL LRG LVL4 (GOWN DISPOSABLE) ×2 IMPLANT
GOWN STRL REUS W/ TWL XL LVL3 (GOWN DISPOSABLE) ×2 IMPLANT
GOWN STRL REUS W/TWL LRG LVL4 (GOWN DISPOSABLE) ×3
GOWN STRL REUS W/TWL XL LVL3 (GOWN DISPOSABLE) ×3
GUIDEWIRE STR ZIPWIRE 035X150 (MISCELLANEOUS) ×3 IMPLANT
KIT TURNOVER CYSTO (KITS) ×3 IMPLANT
MANIFOLD NEPTUNE II (INSTRUMENTS) ×3 IMPLANT
PACK CYSTO AR (MISCELLANEOUS) ×3 IMPLANT
SET CYSTO W/LG BORE CLAMP LF (SET/KITS/TRAYS/PACK) ×3 IMPLANT
SOL .9 NS 3000ML IRR  AL (IV SOLUTION) ×1
SOL .9 NS 3000ML IRR AL (IV SOLUTION) ×2
SOL .9 NS 3000ML IRR UROMATIC (IV SOLUTION) ×2 IMPLANT
SOL PREP PVP 2OZ (MISCELLANEOUS) ×3
SOLUTION PREP PVP 2OZ (MISCELLANEOUS) ×2 IMPLANT
STENT URET 6FRX24 CONTOUR (STENTS) IMPLANT
STENT URET 6FRX26 CONTOUR (STENTS) ×3 IMPLANT
SYR 10ML LL (SYRINGE) ×3 IMPLANT
WATER STERILE IRR 1000ML POUR (IV SOLUTION) ×3 IMPLANT

## 2019-12-09 NOTE — Anesthesia Preprocedure Evaluation (Addendum)
Anesthesia Evaluation  Patient identified by MRN, date of birth, ID band Patient awake    Reviewed: Allergy & Precautions, NPO status , Patient's Chart, lab work & pertinent test results  History of Anesthesia Complications Negative for: history of anesthetic complications  Airway Mallampati: III       Dental   Pulmonary sleep apnea and Continuous Positive Airway Pressure Ventilation , neg COPD,           Cardiovascular hypertension, Pt. on medications +CHF  + dysrhythmias Atrial Fibrillation      Neuro/Psych neg Seizures    GI/Hepatic Neg liver ROS, GERD (resolved with gastric sleeve)  ,  Endo/Other  neg diabetes  Renal/GU negative Renal ROS     Musculoskeletal   Abdominal   Peds  Hematology   Anesthesia Other Findings   Reproductive/Obstetrics                            Anesthesia Physical Anesthesia Plan  ASA: III  Anesthesia Plan: General   Post-op Pain Management:    Induction: Intravenous  PONV Risk Score and Plan: 2 and Dexamethasone and Ondansetron  Airway Management Planned: Oral ETT  Additional Equipment:   Intra-op Plan:   Post-operative Plan:   Informed Consent: I have reviewed the patients History and Physical, chart, labs and discussed the procedure including the risks, benefits and alternatives for the proposed anesthesia with the patient or authorized representative who has indicated his/her understanding and acceptance.       Plan Discussed with:   Anesthesia Plan Comments:         Anesthesia Quick Evaluation

## 2019-12-09 NOTE — H&P (Addendum)
Subjective: Patient is a 59 y.o. male presents with pelvic pain radiating to RLQ. Onset of symptoms was 2 weeks ago. He had a CT on 11/15 revealing a 5X9  proximal right ureteral calculus. He has had N/V with low grade fever and positive urine culture for E. Coli. Started Bactrim yesterday.  Patient Active Problem List   Diagnosis Date Noted  . Hematuria 12/06/2019  . CPAP use counseling 11/08/2019  . S/P laparoscopic sleeve gastrectomy October 2021 10/26/2019  . B12 deficiency 10/21/2019  . OA (osteoarthritis) 06/23/2018  . History of pulmonary embolus (PE) 06/23/2018  . CHF (congestive heart failure), NYHA class II, chronic, diastolic ([place]) 21/30/8657  . PAF (paroxysmal atrial fibrillation) ([place]) 03/30/2015  . Peroneal DVT (deep venous thrombosis) ([place])   . Hypertension 08/17/2011  . GERD (gastroesophageal reflux disease) 08/17/2011  . OSA (obstructive sleep apnea) 08/16/2011   Past Medical History:  Diagnosis Date  . Arthritis   . Arthrofibrosis of total knee arthroplasty ([place]) 05/30/2015  . Bilateral pulmonary embolism ([place])    a. 03/2015 CTA: acute bilat PE; b. IVC filter placed in 2017--> removed 2018.  Marland Kitchen Chest pain    a. 2015 Abnl MV;  b. 2015 Cath: nl cors.  . Chronic diastolic CHF (congestive heart failure) ([place])    a. 03/2015 Echo: EF 55-65%, poor windows, mildly dil RV.  Marland Kitchen DVT (deep venous thrombosis) ([place])    a. 03/2015 U/S: occlusive DVT w/in the distal aspect of the Left fem vein through the L popliteal vein.  Marland Kitchen Dyspnea    On exertion  . Dysrhythmia    afib   . GERD (gastroesophageal reflux disease)   . Headache   . Hypertension   . Hypertensive heart disease   . Obesity   . OSA (obstructive sleep apnea)    uses CPAP nightly settings at 15   . PAF (paroxysmal atrial fibrillation) ([place])   . Pneumonia    hx of years ago   . Primary localized osteoarthritis of left knee    a. 11/2014 s/p L TKA.  . Primary localized osteoarthritis of right knee    a. 02/2014 s/p R  Partial Knee arthroplasty.  . Stiffness of left knee    a. 12/2014 s/p manipulation under anesthesia.  . Urine incontinence     Past Surgical History:  Procedure Laterality Date  . APPENDECTOMY    . CARDIAC CATHETERIZATION    . CARPAL TUNNEL RELEASE Left 03/2018  . EXAM UNDER ANESTHESIA WITH MANIPULATION OF KNEE Left 05/30/2015   Procedure: EXAM UNDER ANESTHESIA WITH MANIPULATION OF LEFT KNEE;  Surgeon: Marchia Bond, MD;  Location: [facility];  Service: Orthopedics;  Laterality: Left;  . HIATAL HERNIA REPAIR N/A 10/26/2019   Procedure: HIATAL HERNIA REPAIR;  Surgeon: Johnathan Hausen, MD;  [facility];  Service: General;  Laterality: N/A;  . I & D KNEE WITH POLY EXCHANGE Left 10/30/2015   Procedure: IRRIGATION AND DEBRIDEMENT KNEE WITH POLY EXCHANGE PLACE SPACERS;  Surgeon: Frederik Pear, MD;  Location: [facility];  Service: Orthopedics;  Laterality: Left;  OFF ELIQUIST 3 DAYS  . IVC FILTER PLACEMENT ([facility] HX)  04/13/15  . IVC FILTER REMOVAL N/A 08/13/2016   Procedure: IVC Filter Removal;  Surgeon: Katha Cabal, MD;  Location: [facility];  Service: Cardiovascular;  Laterality: N/A;  . JOINT REPLACEMENT    . KNEE ARTHROSCOPY Left 08/08/2015   Procedure: LEFT KNEE MANIPULATION WITH ARTHROSCOPIC LYSIS OF ADHESIONS AND ASPIRATION;  Surgeon: Marchia Bond, MD;  [facility];  Service: Orthopedics;  Laterality: Left;  . KNEE CLOSED REDUCTION Left 01/20/2015   Procedure: LEFT KNEE MANIPULATION;  Surgeon: Marchia Bond, MD;  Location: [facility];  Service: Orthopedics;  Laterality: Left;  . LAPAROSCOPIC GASTRIC SLEEVE RESECTION N/A 10/26/2019   Procedure: LAPAROSCOPIC SLEEVE GASTRECTOMY;  Surgeon: Johnathan Hausen, MD;  [facility];  Service: General;  Laterality: N/A;  . LEFT HEART CATHETERIZATION WITH CORONARY ANGIOGRAM N/A 01/12/2014   Procedure: LEFT HEART CATHETERIZATION WITH CORONARY ANGIOGRAM;  Surgeon: Jettie Booze, MD;  Location: [facility] C[facility];  Service:  Cardiovascular;  Laterality: N/A;  . ORTHOPEDIC SURGERY Left    arthroscopy x3  . PARTIAL KNEE ARTHROPLASTY Right 02/25/2014   Procedure: RIGHT KNEE ARTHROPLASTY CONDYLE AND PLATEAU MEDIAL COMPARTMENT ;  Surgeon: Johnny Bridge, MD;  Location: [facility];  Service: Orthopedics;  Laterality: Right;  . PERIPHERAL VASCULAR CATHETERIZATION N/A 03/29/2015   Procedure: IVC Filter Insertion, and possible thrombectomy;  Surgeon: Katha Cabal, MD;  Location: [place];  Service: Cardiovascular;  Laterality: N/A;  . ROTATOR CUFF REPAIR Left   . TOTAL KNEE ARTHROPLASTY  12/06/2014   Procedure: TOTAL KNEE ARTHROPLASTY;  Surgeon: Marchia Bond, MD;  Location: [facility];  Service: Orthopedics;;  . TOTAL KNEE REVISION Left 02/08/2016   Procedure: TOTAL KNEE REVISION;  Surgeon: Frederik Pear, MD;  Location: [facility];  Service: Orthopedics;  Laterality: Left;  . UPPER GI ENDOSCOPY N/A 10/26/2019   Procedure: UPPER GI ENDOSCOPY;  Surgeon: Johnathan Hausen, MD;  [facility];  Service: General;  Laterality: N/A;    Current Facility-Administered Medications  Medication Dose Route Frequency Provider Last Rate Last Admin  . cyanocobalamin ((VITAMIN B-12)) injection 1,000 mcg  1,000 mcg Intramuscular Once Jearld Fenton, NP       Current Outpatient Medications  Medication Sig Dispense Refill Last Dose  . acetaminophen (TYLENOL) 500 MG tablet Take 1,000 mg by mouth every 6 (six) hours as needed for moderate pain.     . baclofen (LIORESAL) 10 MG tablet TAKE 1 TO 2 TABLETS BY MOUTH 3 TIMES DAILY AS NEEDED FOR MUSCLE SPASMS (Patient taking differently: Take 10 mg by mouth 3 (three) times daily as needed for muscle spasms. ) 180 each 0   . cyanocobalamin (,VITAMIN B-12,) 1000 MCG/ML injection Inject 1,000 mcg into the muscle every 30 (thirty) days.      Marland Kitchen enoxaparin (LOVENOX) 40 MG/0.4ML injection Inject 0.4 mLs (40 mg total) into the skin 2 (two) times daily for 14 days. 11.2 mL 0   . flecainide  (TAMBOCOR) 100 MG tablet TAKE 1 TABLET BY MOUTH TWICE (2) DAILY (Patient taking differently: Take 100 mg by mouth 2 (two) times daily. ) 180 tablet 3   . metoprolol succinate (TOPROL-XL) 25 MG 24 hr tablet TAKE 1 TABLET BY MOUTH ONCE A DAY WITH OR IMMEDIATELY FOLLOWING A MEAL (Patient taking differently: Take 25 mg by mouth daily. ) 90 tablet 3   . ondansetron (ZOFRAN-ODT) 4 MG disintegrating tablet Take 1 tablet (4 mg total) by mouth every 6 (six) hours as needed for nausea or vomiting. 20 tablet 0   . oxyCODONE (OXY IR/ROXICODONE) 5 MG immediate release tablet Take 1 tablet (5 mg total) by mouth every 6 (six) hours as needed for severe pain. 10 tablet 0   . pantoprazole (PROTONIX) 40 MG tablet Take 1 tablet (40 mg total) by mouth daily. 90 tablet 0   . Sennosides-Docusate Sodium (SM STOOL SOFTENER/LAXATIVE PO) Take 1  tablet by mouth daily as needed (constipation.).      Marland Kitchen sulfamethoxazole-trimethoprim (BACTRIM DS) 800-160 MG tablet Take 1 tablet by mouth 2 (two) times daily for 7 days. For urinary tract infection. 14 tablet 0   . vitamin B-12 (CYANOCOBALAMIN) 500 MCG tablet Take 500 mcg by mouth daily.  (Patient not taking: Reported on 12/06/2019)     . vitamin B-12 (CYANOCOBALAMIN) 500 MCG tablet Take 500 mcg by mouth daily. (Patient not taking: Reported on 12/06/2019)      Allergies  Allergen Reactions  . Penicillins Anaphylaxis and Other (See Comments)    From childhood; uncertain of reaction Has patient had a PCN reaction causing immediate rash, facial/tongue/throat swelling, SOB or lightheadedness with hypotension: Unk Has patient had a PCN reaction causing severe rash involving mucus membranes or skin necrosis: Unk Has patient had a PCN reaction that required hospitalization: Unk Has patient had a PCN reaction occurring within the last 10 years: No If all of the above answers are "NO", then may proceed with Cephalosporin use.  . Vancomycin Other (See Comments)    Red Mans' syndrome     Social History   Tobacco Use  . Smoking status: Never Smoker  . Smokeless tobacco: Never Used  Substance Use Topics  . Alcohol use: Not Currently    Comment: occassional    Family History  Problem Relation Age of Onset  . Coronary artery disease Mother   . Hyperlipidemia Mother   . Hypertension Mother   . Arthritis Mother   . Coronary artery disease Father   . Heart disease Paternal Grandmother   . Alzheimer's disease Paternal Grandfather     Review of Systems Denies CP, sob, stroke DM.  Objective: Vital signs in last 24 hours:    T 97.6, Ht 5 feet 9 inches, weight 264 pounds, BP 130/80  Generally obese WM in NAD PEERLA, EOMI, sclera clear Lungs clear to auscultation RRR. No murmurs Abd soft   Image Review: CT Scan: see above.   Assessment/Plan: 1. R ureterolithiasis 2. Urinary tract infection 3. Gross hematuria 4. Exposure to Eliquis   Indications for admission: Cysto, retrograde, stent placement

## 2019-12-09 NOTE — Transfer of Care (Signed)
Immediate Anesthesia Transfer of Care Note  Patient: Richard [name]  Procedure(s) Performed: CYSTOSCOPY WITH STENT PLACEMENT (Right ) CYSTOSCOPY WITH RETROGRADE PYELOGRAM  Patient Location: PACU  Anesthesia Type:General  Level of Consciousness: sedated  Airway & Oxygen Therapy: Patient Spontanous Breathing and Patient connected to nasal cannula oxygen  Post-op Assessment: Report given to RN and Post -op Vital signs reviewed and stable  Post vital signs: Reviewed and stable  Last Vitals:  Vitals Value Taken Time  BP 129/71 12/09/19 1545  Temp    Pulse 54 12/09/19 1545  Resp 18 12/09/19 1545  SpO2 96 % 12/09/19 1545  Vitals shown include unvalidated device data.  Last Pain:  Vitals:   12/09/19 1545  TempSrc:   PainSc: 0-No pain         Complications: No complications documented.

## 2019-12-09 NOTE — Anesthesia Procedure Notes (Signed)
Procedure Name: Intubation Date/Time: 12/09/2019 3:04 PM Performed by: Hedda Slade, CRNA Pre-anesthesia Checklist: Patient identified, Patient being monitored, Timeout performed, Emergency Drugs available and Suction available Patient Re-evaluated:Patient Re-evaluated prior to induction Oxygen Delivery Method: Circle system utilized Preoxygenation: Pre-oxygenation with 100% oxygen Induction Type: IV induction Ventilation: Mask ventilation without difficulty Laryngoscope Size: McGraph and 4 Grade View: Grade I Tube type: Oral Tube size: 7.5 mm Number of attempts: 1 Airway Equipment and Method: Stylet Placement Confirmation: ETT inserted through vocal cords under direct vision,  positive ETCO2 and breath sounds checked- equal and bilateral Secured at: 22 cm Tube secured with: Tape Dental Injury: Teeth and Oropharynx as per pre-operative assessment

## 2019-12-09 NOTE — Discharge Instructions (Addendum)
DO NOT PULL ON STENT STRING COMING OUT OF YOUR URETHRA.  Kidney Stones Kidney stones are rock-like masses that form inside of the kidneys. Kidneys are organs that make pee (urine). A kidney stone may move into other parts of the urinary tract, including:  The tubes that connect the kidneys to the bladder (ureters).  The bladder.  The tube that carries urine out of the body (urethra). Kidney stones can cause very bad pain and can block the flow of pee. The stone usually leaves your body (passes) through your pee. You may need to have a doctor take out the stone. What are the causes? Kidney stones may be caused by:  A condition in which certain glands make too much parathyroid hormone (primary hyperparathyroidism).  A buildup of a type of crystals in the bladder made of a chemical called uric acid. The body makes uric acid when you eat certain foods.  Narrowing (stricture) of one or both of the ureters.  A kidney blockage that you were born with.  Past surgery on the kidney or the ureters, such as gastric bypass surgery. What increases the risk? You are more likely to develop this condition if:  You have had a kidney stone in the past.  You have a family history of kidney stones.  You do not drink enough water.  You eat a diet that is high in protein, salt (sodium), or sugar.  You are overweight or very overweight (obese). What are the signs or symptoms? Symptoms of a kidney stone may include:  Pain in the side of the belly, right below the ribs (flank pain). Pain usually spreads (radiates) to the groin.  Needing to pee often or right away (urgently).  Pain when going pee (urinating).  Blood in your pee (hematuria).  Feeling like you may vomit (nauseous).  Vomiting.  Fever and chills. How is this treated? Treatment depends on the size, location, and makeup of the kidney stones. The stones will often pass out of the body through peeing. You may need to:  Drink  more fluid to help pass the stone. In some cases, you may be given fluids through an IV tube put into one of your veins at the hospital.  Take medicine for pain.  Make changes in your diet to help keep kidney stones from coming back. Sometimes, medical procedures are needed to remove a kidney stone. This may involve:  A procedure to break up kidney stones using a beam of light (laser) or shock waves.  Surgery to remove the kidney stones. Follow these instructions at home: Medicines  Take over-the-counter and prescription medicines only as told by your doctor.  Ask your doctor if the medicine prescribed to you requires you to avoid driving or using heavy machinery. Eating and drinking  Drink enough fluid to keep your pee pale yellow. You may be told to drink at least 8-10 glasses of water each day. This will help you pass the stone.  If told by your doctor, change your diet. This may include: ? Limiting how much salt you eat. ? Eating more fruits and vegetables. ? Limiting how much meat, poultry, fish, and eggs you eat.  Follow instructions from your doctor about eating or drinking restrictions. General instructions  Collect pee samples as told by your doctor. You may need to collect a pee sample: ? 24 hours after a stone comes out. ? 8-12 weeks after a stone comes out, and every 6-12 months after that.  Strain your pee  every time you pee (urinate), for as long as told. Use the strainer that your doctor recommends.  Do not throw out the stone. Keep it so that it can be tested by your doctor.  Keep all follow-up visits as told by your doctor. This is important. You may need follow-up tests. How is this prevented? To prevent another kidney stone:  Drink enough fluid to keep your pee pale yellow. This is the best way to prevent kidney stones.  Eat healthy foods.  Avoid certain foods as told by your doctor. You may be told to eat less protein.  Stay at a healthy  weight. Where to find more information  [place]): www.kidney.[facility]): www.urologyhealth.org Contact a doctor if:  You have pain that gets worse or does not get better with medicine. Get help right away if:  You have a fever or chills.  You get very bad pain.  You get new pain in your belly (abdomen).  You pass out (faint).  You cannot pee. Summary  Kidney stones are rock-like masses that form inside of the kidneys.  Kidney stones can cause very bad pain and can block the flow of pee.  The stones will often pass out of the body through peeing.  Drink enough fluid to keep your pee pale yellow. This information is not intended to replace advice given to you by your health care provider. Make sure you discuss any questions you have with your health care provider. Document Revised: 05/26/2018 Document Reviewed: 05/26/2018 Elsevier Patient Education  [place]. Flank Pain, Adult Flank pain is pain in your side. The flank is the area of your side between your upper belly (abdomen) and your back. The pain may occur over a short time (acute), or it may be long-term or come back often (chronic). It may be mild or very bad. Pain in this area can be caused by many different things. Follow these instructions at home:   Drink enough fluid to keep your pee (urine) clear or pale yellow.  Rest as told by your doctor.  Take over-the-counter and prescription medicines only as told by your doctor.  Keep a journal to keep track of: ? What has caused your flank pain. ? What has made it feel better.  Keep all follow-up visits as told by your doctor. This is important. Contact a doctor if:  Medicine does not help your pain.  You have new symptoms.  Your pain gets worse.  You have a fever.  Your symptoms last longer than 2-3 days.  You have trouble peeing.  You are peeing more often than normal. Get help right away  if:  You have trouble breathing.  You are short of breath.  Your belly hurts, or it is swollen or red.  You feel sick to your stomach (nauseous).  You throw up (vomit).  You feel like you will pass out, or you do pass out (faint).  You have blood in your pee. Summary  Flank pain is pain in your side. The flank is the area of your side between your upper belly (abdomen) and your back.  Flank pain may occur over a short time (acute), or it may be long-term or come back often (chronic). It may be mild or very bad.  Pain in this area can be caused by many different things.  Contact your doctor if your symptoms get worse or they last longer than 2-3 days. This information is not  intended to replace advice given to you by your health care provider. Make sure you discuss any questions you have with your health care provider. Document Revised: 12/20/2016 Document Reviewed: 04/29/2016 Elsevier Patient Education  [place]. Ureteral Stent Implantation, Care After This sheet gives you information about how to care for yourself after your procedure. Your health care provider may also give you more specific instructions. If you have problems or questions, contact your health care provider. What can I expect after the procedure? After the procedure, it is common to have:  Nausea.  Mild pain when you urinate. You may feel this pain in your lower back or lower abdomen. The pain should stop within a few minutes after you urinate. This may last for up to 1 week.  A small amount of blood in your urine for several days. Follow these instructions at home: Medicines  Take over-the-counter and prescription medicines only as told by your health care provider.  If you were prescribed an antibiotic medicine, take it as told by your health care provider. Do not stop taking the antibiotic even if you start to feel better.  Do not drive for 24 hours if you were given a sedative during your  procedure.  Ask your health care provider if the medicine prescribed to you requires you to avoid driving or using heavy machinery. Activity  Rest as told by your health care provider.  Avoid sitting for a long time without moving. Get up to take short walks every 1-2 hours. This is important to improve blood flow and breathing. Ask for help if you feel weak or unsteady.  Return to your normal activities as told by your health care provider. Ask your health care provider what activities are safe for you. General instructions   Watch for any blood in your urine. Call your health care provider if the amount of blood in your urine increases.  If you have a catheter: ? Follow instructions from your health care provider about taking care of your catheter and collection bag. ? Do not take baths, swim, or use a hot tub until your health care provider approves. Ask your health care provider if you may take showers. You may only be allowed to take sponge baths.  Drink enough fluid to keep your urine pale yellow.  Do not use any products that contain nicotine or tobacco, such as cigarettes, e-cigarettes, and chewing tobacco. These can delay healing after surgery. If you need help quitting, ask your health care provider.  Keep all follow-up visits as told by your health care provider. This is important. Contact a health care provider if:  You have pain that gets worse or does not get better with medicine, especially pain when you urinate.  You have difficulty urinating.  You feel nauseous or you vomit repeatedly during a period of more than 2 days after the procedure. Get help right away if:  Your urine is dark red or has blood clots in it.  You are leaking urine (have incontinence).  The end of the stent comes out of your urethra.  You cannot urinate.  You have sudden, sharp, or severe pain in your abdomen or lower back.  You have a fever.  You have swelling or pain in your  legs.  You have difficulty breathing. Summary  After the procedure, it is common to have mild pain when you urinate that goes away within a few minutes after you urinate. This may last for up to 1  week.  Watch for any blood in your urine. Call your health care provider if the amount of blood in your urine increases.  Take over-the-counter and prescription medicines only as told by your health care provider.  Drink enough fluid to keep your urine pale yellow. This information is not intended to replace advice given to you by your health care provider. Make sure you discuss any questions you have with your health care provider. Document Revised: 10/14/2017 Document Reviewed: 10/15/2017 Elsevier Patient Education  2020 [place]   1) The drugs that you were given will stay in your system until tomorrow so for the next 24 hours you should not:  A) Drive an automobile B) Make any legal decisions C) Drink any alcoholic beverage   2) You may resume regular meals tomorrow.  Today it is better to start with liquids and gradually work up to solid foods.  You may eat anything you prefer, but it is better to start with liquids, then soup and crackers, and gradually work up to solid foods.   3) Please notify your doctor immediately if you have any unusual bleeding, trouble breathing, redness and pain at the surgery site, drainage, fever, or pain not relieved by medication.    4) Additional Instructions:        Please contact your physician with any problems or Same Day Surgery at [phone], Monday through Friday 6 am to 4 pm, or [place] at [facility] number at [phone].

## 2019-12-09 NOTE — Op Note (Signed)
Preoperative diagnosis: 1.  Right ureterolithiasis (N20.1)                                                                                         2.  Renal colic (W73)                                             3.  Urinary tract infection (N39.0)  Postoperative diagnosis: Same  Procedure: 1.  Right double-pigtail ureteral stent placement  (CPT 71062)                      2.  Retrograde pyelography (CPT 52005)  Surgeon: Otelia Limes. Yves Dill MD  Anesthesia: General  Indications:See the history and physical. After informed consent the above procedure(s) were requested     Technique and findings: After adequate general anesthesia been obtained the patient was placed into dorsal lithotomy position and the perineum was prepped and draped in usual fashion.  Fluoroscopy was performed and a poorly calcified calculus was identified in the proximal right ureter.  The 21 French cystoscope was then coupled to the camera and visually advanced into the bladder.  The patient had a 62 French urethral stricture in the distal pendulous urethra.  Bladder was thoroughly inspected.  Both ureteral orifices were identified and had clear efflux.  No bladder mucosal lesions were identified.  A 6 French open-ended ureteral catheter placed into the right orifice and retrograde pyelography was performed.  A 5 x 9 mm calculus was identified in the proximal right ureter.  There was moderate degree of hydronephrosis present.  A 0.035 Glidewire was then advanced through the open-ended catheter and curled into the right renal pelvis.  The open-ended catheter was then removed taking care leave the guidewire in position.  A 6 x 26 cm pigtail ureteral catheter was advanced over the guidewire and positioned in the ureter under fluoroscopic guidance.  The retrieval suture was left attached to the stent.  Guidewire was then removed taking care to leave the stent in position.  Bladder was then drained and the cystoscope was removed.  10 cc of  viscous Xylocaine was instilled within the urethra.  A B&O suppository was placed.  Procedure was then terminated and patient transferred to the recovery room in stable condition.

## 2019-12-10 ENCOUNTER — Encounter: Payer: Self-pay | Admitting: Urology

## 2019-12-10 NOTE — Anesthesia Postprocedure Evaluation (Signed)
Anesthesia Post Note  Patient: Richard [name]  Procedure(s) Performed: CYSTOSCOPY WITH STENT PLACEMENT (Right ) CYSTOSCOPY WITH RETROGRADE PYELOGRAM  Patient location during evaluation: PACU Anesthesia Type: General Level of consciousness: awake and alert Pain management: pain level controlled Vital Signs Assessment: post-procedure vital signs reviewed and stable Respiratory status: spontaneous breathing and respiratory function stable Cardiovascular status: stable Anesthetic complications: no   No complications documented.   Last Vitals:  Vitals:   12/09/19 1639 12/09/19 1649  BP: 118/76 130/68  Pulse: (!) 48 (!) 47  Resp: 14 16  Temp: (!) 36.4 C (!) 35.6 C  SpO2: 96% 96%    Last Pain:  Vitals:   12/09/19 1649  TempSrc: Temporal  PainSc: 3                  Lizann Edelman K

## 2019-12-20 ENCOUNTER — Other Ambulatory Visit: Payer: Self-pay

## 2019-12-20 ENCOUNTER — Encounter: Payer: PRIVATE HEALTH INSURANCE | Attending: Surgery | Admitting: Skilled Nursing Facility1

## 2019-12-20 DIAGNOSIS — K219 Gastro-esophageal reflux disease without esophagitis: Secondary | ICD-10-CM | POA: Diagnosis not present

## 2019-12-20 DIAGNOSIS — Z9884 Bariatric surgery status: Secondary | ICD-10-CM | POA: Diagnosis not present

## 2019-12-20 DIAGNOSIS — Z6837 Body mass index (BMI) 37.0-37.9, adult: Secondary | ICD-10-CM | POA: Insufficient documentation

## 2019-12-20 DIAGNOSIS — Z713 Dietary counseling and surveillance: Secondary | ICD-10-CM | POA: Insufficient documentation

## 2019-12-20 DIAGNOSIS — I110 Hypertensive heart disease with heart failure: Secondary | ICD-10-CM | POA: Diagnosis not present

## 2019-12-20 DIAGNOSIS — I509 Heart failure, unspecified: Secondary | ICD-10-CM | POA: Diagnosis not present

## 2019-12-20 DIAGNOSIS — N201 Calculus of ureter: Secondary | ICD-10-CM | POA: Diagnosis not present

## 2019-12-20 DIAGNOSIS — E669 Obesity, unspecified: Secondary | ICD-10-CM

## 2019-12-20 NOTE — Progress Notes (Signed)
Bariatric Nutrition Follow-Up Visit Medical Nutrition Therapy  Appt Start Time: 2:00  End Time: 2:45 pm  2 Months Post-Operative sleeve Surgery Surgery Date: 10/26/2019  NUTRITION ASSESSMENT    Anthropometrics  Start weight at [facility]: 302.4 lbs  Today's weight: 253.8lbs  Body Composition Scale Date  Weight  lbs 253.8  Total Body Fat  % 33.1     Visceral Fat 26  Fat-Free Mass  % 66.8     Total Body Water  % 47.8     Muscle-Mass  lbs 47.8  BMI 37.2  Body Fat Displacement ---        Torso  lbs 52.1        Left Leg  lbs 10.4        Right Leg  lbs 10.4        Left Arm  lbs 5.2        Right Arm  lbs 5.2   Clinical  Medical hx: HTN, CHF. GERD, Renal Stone Medications: see list Labs:    Lifestyle & Dietary Hx Pt states he had a kidney stone which caused his lower abdominal pain and now its passed. Pt states he still having hematuria  but feel no pain. Pt states he wants to know how to cook fish and vegetables. Dietitian suggested some cooking ideas for fish and vegetables. Pt states his mouth feels greasy all the time and couldn't taste food.  Pt states having trouble to sleep because of sleep apnea machine and states his sleep apnea machine causes dry mouth. Pt states he doesnot like lean beef.  Estimated daily fluid intake: 16.9 X 5-6 oz Estimated daily protein intake: g Supplements:  Current average weekly physical activity: walk 1-2 miles on usual day  24-Hr Dietary Recall First Meal: 1 or 2 egg + sausage Snack:  Second Meal: grilled chicken Snack:  Third Meal: lean ham burger or fish or chicken noodle soup Snack: 2 times [place] piece Beverages: water and geteroid   Post-Op Goals/ Signs/ Symptoms Using straws: no Drinking while eating: no Chewing/swallowing difficulties: no Changes in vision: no Changes to mood/headaches: no Hair loss/changes to skin/nails: no Difficulty focusing/concentrating: no Sweating: no Dizziness/lightheadedness: no Palpitations:  n0 Carbonated/caffeinated beverages: no N/V/D/C/Gas: no Abdominal pain: no Dumping syndrome: no    NUTRITION DIAGNOSIS  Overweight/obesity ([place]-3.3) related to past poor dietary habits and physical inactivity as evidenced by completed bariatric surgery and following dietary guidelines for continued weight loss and healthy nutrition status.     NUTRITION INTERVENTION Nutrition counseling (C-1) and education (E-2) to facilitate bariatric surgery goals, including: . Diet advancement to the next phase (phase 3) now including Non starchy vegetables . The importance of consuming adequate calories as well as certain nutrients daily due to the body's need for essential vitamins, minerals, and fats . The importance of daily physical activity and to reach a goal of at least 150 minutes of moderate to vigorous physical activity weekly (or as directed by their physician) due to benefits such as increased musculature and improved lab values . The importance of intuitive eating specifically learning hunger-satiety cues and understanding the importance of learning a new body: The importance of mindful eating to avoid grazing behaviors   Goals: -Continue to aim for a minimum of 64 fluid ounces 7 days a week with at least 30 ounces being plain water  -Eat non-starchy vegetables 2 times a day 7 days a week  -Start out with soft cooked vegetables today and tomorrow; if tolerated begin to eat  raw vegetables or cooked including salads  -Eat your 3 ounces of protein first then start in on your non-starchy vegetables; once you understand how much of your meal leads to satisfaction and not full while still eating 3 ounces of protein and non-starchy vegetables you can eat them in any order   -Continue to aim for 30 minutes of activity at least 5 times a week  -Do NOT cook with/add to your food: alfredo sauce, cheese sauce, barbeque sauce, ketchup, fat back, butter, bacon grease, grease, Crisco, OR  SUGAR   Handouts Provided Include   Phase 3 handout  Learning Style & Readiness for Change Teaching method utilized: Visual & Auditory  Demonstrated degree of understanding via: Teach Back  Readiness Level: Action Barriers to learning/adherence to lifestyle change: none identified  RD's Notes for Next Visit . Assess adherence to pt chosen goals    MONITORING & EVALUATION Dietary intake, weekly physical activity, body weight  Next Steps Patient is to follow-up in 2 months.

## 2019-12-22 DIAGNOSIS — N201 Calculus of ureter: Secondary | ICD-10-CM | POA: Diagnosis not present

## 2019-12-22 DIAGNOSIS — N132 Hydronephrosis with renal and ureteral calculous obstruction: Secondary | ICD-10-CM | POA: Diagnosis not present

## 2019-12-22 DIAGNOSIS — R310 Gross hematuria: Secondary | ICD-10-CM | POA: Diagnosis not present

## 2019-12-23 ENCOUNTER — Ambulatory Visit: Admission: RE | Admit: 2019-12-23 | Payer: PPO | Source: Home / Self Care | Admitting: Urology

## 2019-12-23 ENCOUNTER — Encounter: Admission: RE | Payer: Self-pay | Source: Home / Self Care

## 2019-12-23 SURGERY — LITHOTRIPSY, ESWL
Anesthesia: Moderate Sedation | Laterality: Right

## 2019-12-26 ENCOUNTER — Emergency Department
Admission: EM | Admit: 2019-12-26 | Discharge: 2019-12-26 | Disposition: A | Discharge status: Home / Self Care | Payer: No Typology Code available for payment source | Attending: Emergency Medicine | Admitting: Emergency Medicine

## 2019-12-26 ENCOUNTER — Encounter: Payer: Self-pay | Admitting: Emergency Medicine

## 2019-12-26 ENCOUNTER — Emergency Department: Payer: No Typology Code available for payment source

## 2019-12-26 DIAGNOSIS — I5032 Chronic diastolic (congestive) heart failure: Secondary | ICD-10-CM | POA: Insufficient documentation

## 2019-12-26 DIAGNOSIS — Z96651 Presence of right artificial knee joint: Secondary | ICD-10-CM | POA: Insufficient documentation

## 2019-12-26 DIAGNOSIS — Z96652 Presence of left artificial knee joint: Secondary | ICD-10-CM | POA: Diagnosis not present

## 2019-12-26 DIAGNOSIS — Z7901 Long term (current) use of anticoagulants: Secondary | ICD-10-CM | POA: Diagnosis not present

## 2019-12-26 DIAGNOSIS — Z79899 Other long term (current) drug therapy: Secondary | ICD-10-CM | POA: Insufficient documentation

## 2019-12-26 DIAGNOSIS — R1031 Right lower quadrant pain: Secondary | ICD-10-CM | POA: Diagnosis present

## 2019-12-26 DIAGNOSIS — I110 Hypertensive heart disease with heart failure: Secondary | ICD-10-CM | POA: Insufficient documentation

## 2019-12-26 DIAGNOSIS — N200 Calculus of kidney: Secondary | ICD-10-CM | POA: Diagnosis not present

## 2019-12-26 LAB — BASIC METABOLIC PANEL
Anion gap: 10 (ref 5–15)
BUN: 21 mg/dL — ABNORMAL HIGH (ref 6–20)
CO2: 23 mmol/L (ref 22–32)
Calcium: 9.3 mg/dL (ref 8.9–10.3)
Chloride: 105 mmol/L (ref 98–111)
Creatinine, Ser: 1.34 mg/dL — ABNORMAL HIGH (ref 0.61–1.24)
GFR, Estimated: >60 mL/min (ref >60)
Glucose, Bld: 108 mg/dL — ABNORMAL HIGH (ref 70–99)
Potassium: 3.7 mmol/L (ref 3.5–5.1)
Sodium: 138 mmol/L (ref 135–145)

## 2019-12-26 LAB — URINALYSIS, COMPLETE (UACMP) WITH MICROSCOPIC
Bacteria, UA: NONE SEEN
Bilirubin Urine: NEGATIVE
Glucose, UA: NEGATIVE mg/dL
Hgb urine dipstick: NEGATIVE
Ketones, ur: 5 mg/dL — AB
Nitrite: NEGATIVE
Protein, ur: 30 mg/dL — AB
Specific Gravity, Urine: 1.026 (ref 1.005–1.030)
Squamous Epithelial / HPF: NONE SEEN (ref 0–5)
WBC, UA: NONE SEEN WBC/hpf (ref 0–5)
pH: 5 (ref 5.0–8.0)

## 2019-12-26 LAB — CBC
HCT: 44.5 % (ref 39.0–52.0)
Hemoglobin: 15 g/dL (ref 13.0–17.0)
MCH: 30.4 pg (ref 26.0–34.0)
MCHC: 33.7 g/dL (ref 30.0–36.0)
MCV: 90.3 fL (ref 80.0–100.0)
Platelets: 188 10*3/uL (ref 150–400)
RBC: 4.93 MIL/uL (ref 4.22–5.81)
RDW: 13.4 % (ref 11.5–15.5)
WBC: 10.5 10*3/uL (ref 4.0–10.5)
nRBC: 0 % (ref 0.0–0.2)

## 2019-12-26 MED ORDER — OXYCODONE-ACETAMINOPHEN 5-325 MG PO TABS
1.0000 | ORAL_TABLET | Freq: Three times a day (TID) | ORAL | 0 refills | Status: AC | PRN
Start: 2019-12-26 — End: 2019-12-31

## 2019-12-26 MED ORDER — OXYCODONE-ACETAMINOPHEN 5-325 MG PO TABS
1.0000 | ORAL_TABLET | Freq: Once | ORAL | Status: AC
  Administered 2019-12-26: 1 via ORAL
  Filled 2019-12-26: qty 1

## 2019-12-26 MED ORDER — ONDANSETRON HCL 4 MG/2ML IJ SOLN: 4.0000 mg | Freq: Once | INTRAMUSCULAR | Status: AC

## 2019-12-26 MED ORDER — ONDANSETRON HCL 4 MG/2ML IJ SOLN
INTRAMUSCULAR | Status: AC
  Administered 2019-12-26: 4 mg via INTRAVENOUS
  Filled 2019-12-26: qty 2

## 2019-12-26 MED ORDER — HYDROMORPHONE HCL 1 MG/ML IJ SOLN: 1.0000 mg | Freq: Once | INTRAMUSCULAR | Status: AC

## 2019-12-26 MED ORDER — HYDROMORPHONE HCL 1 MG/ML IJ SOLN
INTRAMUSCULAR | Status: AC
  Administered 2019-12-26: 1 mg via INTRAVENOUS
  Filled 2019-12-26: qty 1

## 2019-12-26 NOTE — ED Provider Notes (Signed)
[facility] Emergency Department Provider Note  ____________________________________________   First MD Initiated Contact with Patient 12/26/19 0725     (approximate)  I have reviewed the triage vital signs and the nursing notes.   HISTORY  Chief Complaint Flank Pain   HPI Richard [name] is a 59 y.o. male with a past medical history of arthritis, bilateral PEs currently on Eliquis, A. fib, OSA, obesity status post gastric bypass surgery last month, and known right-sided kidney stone currently followed by Dr. Yves Dill status post removal of right-sided stent last week who presents for assessment of proximately 1 day of right flank and right lower quadrant abdominal pain associate with some nausea.  Patient states this is very similar to the pain he initially experienced when the stone discovered last month.  He states that since his stent was removed last month he has had some blood in his urine but denies any burning with urination.  He denies any fevers, chills, cough, chest pain, back pain, vomiting, diarrhea, rash or other acute sick symptoms.  No recent traumatic injury or falls.         Past Medical History:  Diagnosis Date  . Arthritis   . Arthrofibrosis of total knee arthroplasty ([place]) 05/30/2015  . Bilateral pulmonary embolism ([place])    a. 03/2015 CTA: acute bilat PE; b. IVC filter placed in 2017--> removed 2018.  Marland Kitchen Chest pain    a. 2015 Abnl MV;  b. 2015 Cath: nl cors.  . Chronic diastolic CHF (congestive heart failure) ([place])    a. 03/2015 Echo: EF 55-65%, poor windows, mildly dil RV.  Marland Kitchen DVT (deep venous thrombosis) ([place])    a. 03/2015 U/S: occlusive DVT w/in the distal aspect of the Left fem vein through the L popliteal vein.  Marland Kitchen Dyspnea    On exertion  . Dysrhythmia    afib   . GERD (gastroesophageal reflux disease)   . Headache   . Hypertension   . Hypertensive heart disease   . Obesity   . OSA (obstructive sleep apnea)    uses CPAP nightly  settings at 15   . PAF (paroxysmal atrial fibrillation) ([place])   . Pneumonia    hx of years ago   . Primary localized osteoarthritis of left knee    a. 11/2014 s/p L TKA.  . Primary localized osteoarthritis of right knee    a. 02/2014 s/p R Partial Knee arthroplasty.  . Stiffness of left knee    a. 12/2014 s/p manipulation under anesthesia.  . Urine incontinence     Patient Active Problem List   Diagnosis Date Noted  . Hematuria 12/06/2019  . CPAP use counseling 11/08/2019  . S/P laparoscopic sleeve gastrectomy October 2021 10/26/2019  . B12 deficiency 10/21/2019  . OA (osteoarthritis) 06/23/2018  . History of pulmonary embolus (PE) 06/23/2018  . CHF (congestive heart failure), NYHA class II, chronic, diastolic ([place]) 85/27/7824  . PAF (paroxysmal atrial fibrillation) ([place]) 03/30/2015  . Peroneal DVT (deep venous thrombosis) ([place])   . Hypertension 08/17/2011  . GERD (gastroesophageal reflux disease) 08/17/2011  . OSA (obstructive sleep apnea) 08/16/2011    Past Surgical History:  Procedure Laterality Date  . APPENDECTOMY    . CARDIAC CATHETERIZATION    . CARPAL TUNNEL RELEASE Left 03/2018  . CYSTOSCOPY W/ RETROGRADES  12/09/2019   Procedure: CYSTOSCOPY WITH RETROGRADE PYELOGRAM;  Surgeon: Royston Cowper, MD;  Location: [facility];  Service: Urology;;  . Consuela Mimes WITH STENT PLACEMENT Right 12/09/2019  Procedure: CYSTOSCOPY WITH STENT PLACEMENT;  Surgeon: Royston Cowper, MD;  Location: [facility];  Service: Urology;  Laterality: Right;  . EXAM UNDER ANESTHESIA WITH MANIPULATION OF KNEE Left 05/30/2015   Procedure: EXAM UNDER ANESTHESIA WITH MANIPULATION OF LEFT KNEE;  Surgeon: Marchia Bond, MD;  Location: [place];  Service: Orthopedics;  Laterality: Left;  . HIATAL HERNIA REPAIR N/A 10/26/2019   Procedure: HIATAL HERNIA REPAIR;  Surgeon: Johnathan Hausen, MD;  Location: [facility];  Service: General;  Laterality: N/A;  . I & D KNEE WITH POLY EXCHANGE Left 10/30/2015   Procedure:  IRRIGATION AND DEBRIDEMENT KNEE WITH POLY EXCHANGE PLACE SPACERS;  Surgeon: Frederik Pear, MD;  Location: [place];  Service: Orthopedics;  Laterality: Left;  OFF ELIQUIST 3 DAYS  . IVC FILTER PLACEMENT ([facility] HX)  04/13/15  . IVC FILTER REMOVAL N/A 08/13/2016   Procedure: IVC Filter Removal;  Surgeon: Katha Cabal, MD;  Location: [place] CV LAB;  Service: Cardiovascular;  Laterality: N/A;  . JOINT REPLACEMENT    . KNEE ARTHROSCOPY Left 08/08/2015   Procedure: LEFT KNEE MANIPULATION WITH ARTHROSCOPIC LYSIS OF ADHESIONS AND ASPIRATION;  Surgeon: Marchia Bond, MD;  Location: [place];  Service: Orthopedics;  Laterality: Left;  . KNEE CLOSED REDUCTION Left 01/20/2015   Procedure: LEFT KNEE MANIPULATION;  Surgeon: Marchia Bond, MD;  Location: [place];  Service: Orthopedics;  Laterality: Left;  . LAPAROSCOPIC GASTRIC SLEEVE RESECTION N/A 10/26/2019   Procedure: LAPAROSCOPIC SLEEVE GASTRECTOMY;  Surgeon: Johnathan Hausen, MD;  [facility];  Service: General;  Laterality: N/A;  . LEFT HEART CATHETERIZATION WITH CORONARY ANGIOGRAM N/A 01/12/2014   Procedure: LEFT HEART CATHETERIZATION WITH CORONARY ANGIOGRAM;  Surgeon: Jettie Booze, MD;  Location: [facility];  Service: Cardiovascular;  Laterality: N/A;  . ORTHOPEDIC SURGERY Left    arthroscopy x3  . PARTIAL KNEE ARTHROPLASTY Right 02/25/2014   Procedure: RIGHT KNEE ARTHROPLASTY CONDYLE AND PLATEAU MEDIAL COMPARTMENT ;  Surgeon: Johnny Bridge, MD;  Location: [place];  Service: Orthopedics;  Laterality: Right;  . PERIPHERAL VASCULAR CATHETERIZATION N/A 03/29/2015   Procedure: IVC Filter Insertion, and possible thrombectomy;  Surgeon: Katha Cabal, MD;  Location: [place] CV [facility];  Service: Cardiovascular;  Laterality: N/A;  . ROTATOR CUFF REPAIR Left   . TOTAL KNEE ARTHROPLASTY  12/06/2014   Procedure: TOTAL KNEE ARTHROPLASTY;  Surgeon: Marchia Bond, MD;  Location: [place];  Service: Orthopedics;;  .  TOTAL KNEE REVISION Left 02/08/2016   Procedure: TOTAL KNEE REVISION;  Surgeon: Frederik Pear, MD;  Location: [place];  Service: Orthopedics;  Laterality: Left;  . UPPER GI ENDOSCOPY N/A 10/26/2019   Procedure: UPPER GI ENDOSCOPY;  Surgeon: Johnathan Hausen, MD;  [facility];  Service: General;  Laterality: N/A;    Prior to Admission medications   Medication Sig Start Date End Date Taking? Authorizing Provider  acetaminophen (TYLENOL) 500 MG tablet Take 1,000 mg by mouth every 6 (six) hours as needed for moderate pain.    [provider]  apixaban (ELIQUIS) 2.5 MG TABS tablet Take 5 mg by mouth 2 (two) times daily.    [provider]  baclofen (LIORESAL) 10 MG tablet TAKE 1 TO 2 TABLETS BY MOUTH 3 TIMES DAILY AS NEEDED FOR MUSCLE SPASMS Patient taking differently: Take 10 mg by mouth 3 (three) times daily as needed for muscle spasms.  09/10/19   Jearld Fenton, NP  cyanocobalamin (,VITAMIN B-12,) 1000 MCG/ML injection Inject 1,000 mcg into the muscle  every 30 (thirty) days.     [provider]  enoxaparin (LOVENOX) 40 MG/0.4ML injection Inject 0.4 mLs (40 mg total) into the skin 2 (two) times daily for 14 days. 10/27/19 11/10/19  Johnathan Hausen, MD  flecainide (TAMBOCOR) 100 MG tablet TAKE 1 TABLET BY MOUTH TWICE (2) DAILY Patient taking differently: Take 100 mg by mouth 2 (two) times daily.  08/30/19   Wellington Hampshire, MD  HYDROcodone-acetaminophen (NORCO) 10-325 MG tablet Take 1 tablet by mouth every 4 (four) hours as needed for moderate pain. Maximum dose per 24 hours -8 pills. 12/09/19   Royston Cowper, MD  Hyoscyamine Sulfate SL (LEVSIN/SL) 0.125 MG SUBL Place 0.125 mg under the tongue every 4 (four) hours as needed (bladder spasms, urgency, frequency). 1-2 TABS 12/09/19   Royston Cowper, MD  Meth-Hyo-M Barnett Hatter Phos-Ph Sal (URIBEL) 118 MG CAPS Take 1 capsule (118 mg total) by mouth every 6 (six) hours as needed (dysuria, painful urination). 12/09/19   Royston Cowper, MD  metoprolol succinate (TOPROL-XL) 25 MG 24 hr tablet TAKE 1 TABLET BY MOUTH ONCE A DAY WITH OR IMMEDIATELY FOLLOWING A MEAL Patient taking differently: Take 25 mg by mouth daily.  08/30/19   Wellington Hampshire, MD  ondansetron (ZOFRAN ODT) 8 MG disintegrating tablet Take 1 tablet (8 mg total) by mouth every 6 (six) hours as needed for nausea or vomiting. 12/09/19   Royston Cowper, MD  ondansetron (ZOFRAN-ODT) 4 MG disintegrating tablet Take 1 tablet (4 mg total) by mouth every 6 (six) hours as needed for nausea or vomiting. 10/27/19   Johnathan Hausen, MD  oxyCODONE (OXY IR/ROXICODONE) 5 MG immediate release tablet Take 1 tablet (5 mg total) by mouth every 6 (six) hours as needed for severe pain. 10/27/19   Johnathan Hausen, MD  pantoprazole (PROTONIX) 40 MG tablet Take 1 tablet (40 mg total) by mouth daily. 10/27/19   Johnathan Hausen, MD  Sennosides-Docusate Sodium (SM STOOL SOFTENER/LAXATIVE PO) Take 1 tablet by mouth daily as needed (constipation.).     [provider]  tamsulosin (FLOMAX) 0.4 MG CAPS capsule Take 1 capsule (0.4 mg total) by mouth daily. 12/09/19   Royston Cowper, MD  vitamin B-12 (CYANOCOBALAMIN) 500 MCG tablet Take 500 mcg by mouth daily.  Patient not taking: Reported on 12/06/2019    [provider]  vitamin B-12 (CYANOCOBALAMIN) 500 MCG tablet Take 500 mcg by mouth daily. Patient not taking: Reported on 12/06/2019    [provider]    Allergies Penicillins and Vancomycin  Family History  Problem Relation Age of Onset  . Coronary artery disease Mother   . Hyperlipidemia Mother   . Hypertension Mother   . Arthritis Mother   . Coronary artery disease Father   . Heart disease Paternal Grandmother   . Alzheimer's disease Paternal Grandfather     Social History Social History   Tobacco Use  . Smoking status: Never Smoker  . Smokeless tobacco: Never Used  Vaping Use  . Vaping Use: Never used  Substance Use Topics  . Alcohol use: Not  Currently    Comment: occassional  . Drug use: No    Review of Systems  Review of Systems  Constitutional: Negative for chills and fever.  HENT: Negative for sore throat.   Eyes: Negative for pain.  Respiratory: Negative for cough and stridor.   Cardiovascular: Negative for chest pain.  Gastrointestinal: Positive for abdominal pain and nausea. Negative for vomiting.  Genitourinary: Positive for flank pain  and hematuria.  Musculoskeletal: Negative for myalgias.  Skin: Negative for rash.  Neurological: Negative for seizures, loss of consciousness and headaches.  Psychiatric/Behavioral: Negative for suicidal ideas.  All other systems reviewed and are negative.     ____________________________________________   PHYSICAL EXAM:  VITAL SIGNS: ED Triage Vitals  Enc Vitals Group     BP 12/26/19 0103 118/80     Pulse Rate 12/26/19 0103 62     Resp 12/26/19 0103 18     Temp 12/26/19 0103 99.2 F (37.3 C)     Temp Source 12/26/19 0103 Oral     SpO2 12/26/19 0103 95 %     Weight --      Height --      Head Circumference --      Peak Flow --      Pain Score 12/26/19 0400 8     Pain Loc --      Pain Edu? --      Excl. in [place]? --    Vitals:   12/26/19 0727 12/26/19 0742  BP: (!) 142/79 (!) 142/98  Pulse: 79 (!) 49  Resp: 18 17  Temp: 98.1 F (36.7 C)   SpO2: 97% 97%   Physical Exam Vitals and nursing note reviewed.  Constitutional:      Appearance: He is well-developed.  HENT:     Head: Normocephalic and atraumatic.     Right Ear: External ear normal.     Left Ear: External ear normal.     Nose: Nose normal.     Mouth/Throat:     Mouth: Mucous membranes are moist.  Eyes:     Conjunctiva/sclera: Conjunctivae normal.  Cardiovascular:     Rate and Rhythm: Regular rhythm. Bradycardia present.     Heart sounds: No murmur heard.   Pulmonary:     Effort: Pulmonary effort is normal. No respiratory distress.     Breath sounds: Normal breath sounds.  Abdominal:      Palpations: Abdomen is soft.     Tenderness: There is abdominal tenderness in the right lower quadrant. There is no right CVA tenderness or left CVA tenderness.  Musculoskeletal:     Cervical back: Neck supple.  Skin:    General: Skin is warm and dry.     Capillary Refill: Capillary refill takes less than 2 seconds.  Neurological:     Mental Status: He is alert and oriented to person, place, and time.  Psychiatric:        Mood and Affect: Mood normal.      ____________________________________________   LABS (all labs ordered are listed, but only abnormal results are displayed)  Labs Reviewed  URINALYSIS, COMPLETE (UACMP) WITH MICROSCOPIC - Abnormal; Notable for the following components:      Result Value   Color, Urine YELLOW (*)    APPearance HAZY (*)    Ketones, ur 5 (*)    Protein, ur 30 (*)    Leukocytes,Ua SMALL (*)    All other components within normal limits  BASIC METABOLIC PANEL - Abnormal; Notable for the following components:   Glucose, Bld 108 (*)    BUN 21 (*)    Creatinine, Ser 1.34 (*)    All other components within normal limits  URINE CULTURE  CBC   ____________________________________________  ____________________________________________  RADIOLOGY   Official radiology report(s): CT Renal Stone Study  Result Date: 12/26/2019 CLINICAL DATA:  Flank pain EXAM: CT ABDOMEN AND PELVIS WITHOUT CONTRAST TECHNIQUE: Multidetector CT imaging of the abdomen  and pelvis was performed following the standard protocol without IV contrast. COMPARISON:  12/06/2019 FINDINGS: LOWER CHEST: Normal. HEPATOBILIARY: Normal hepatic contours. No intra- or extrahepatic biliary dilatation. There is cholelithiasis without acute inflammation. PANCREAS: Normal pancreas. No ductal dilatation or peripancreatic fluid collection. SPLEEN: Normal. ADRENALS/URINARY TRACT: The adrenal glands are normal. There is an 8 mm stone in the proximal right ureter, which has traveled more distally since  the prior study. There is moderate right perinephric stranding. Mild right hydroureteronephrosis. No left nephroureterolithiasis. The urinary bladder is normal for degree of distention STOMACH/BOWEL: Remote sleeve gastrectomy. No small bowel dilatation or inflammation. No focal colonic abnormality. The appendix is not visualized. No right lower quadrant inflammation or free fluid. VASCULAR/LYMPHATIC: There is calcific atherosclerosis of the abdominal aorta. No lymphadenopathy. REPRODUCTIVE: There are calcifications within the normal-sized prostate. Symmetric seminal vesicles. MUSCULOSKELETAL. No bony spinal canal stenosis or focal osseous abnormality. OTHER: None. IMPRESSION: 1. Right-sided obstructive uropathy with 8 mm stone in the proximal right ureter, which has traveled more distally since the prior study. Aortic Atherosclerosis (ICD10-I70.0). Electronically Signed   By: Ulyses Jarred M.D.   On: 12/26/2019 02:58    ____________________________________________   PROCEDURES  Procedure(s) performed (including Critical Care):  Procedures   ____________________________________________   INITIAL IMPRESSION / ASSESSMENT AND PLAN / ED COURSE      Patient presents with [place] to history exam for assessment of nausea and some right-sided lower abdominal pain and right flank pain that began yesterday.  On arrival patient is afebrile and hemodynamically stable on room air.  On exam he does have some very mild tenderness in the right lower quadrant.  CBC obtained unremarkable.  BMP without evidence of significant electrolyte or metabolic derangements.  Kidney function is close to baseline at 1.34 compared to 1.27 2 months ago.  Urine has 5 ketones, 30 protein, and small LES without any overt evidence of infection.  Low suspicion for sepsis or pyelonephritis.  CT stone study obtained does show evidence of right-sided obstructive 8 mm stone in the proximal right ureter with some aortic atherosclerosis.  No  other acute intra-abdominal pathology including diverticulitis, appendicitis, cholecystitis, or perinephric stranding to suggest renal infarct or infection.  Patient did receive below noted analgesia.  On my reassessment he stated he felt much better.  I did discuss patient's presentation work-up with his urologist Dr. Eliberto Ivory who stated he would see the patient tomorrow morning at 8 AM patient's pain was adequately controlled.  Given pain is adequately controlled I think this is reasonable.  Patient already has analgesia and antiemetics at home.  Discharge stable condition.  Strict return precautions advised and discussed  ____________________________________________   FINAL CLINICAL IMPRESSION(S) / ED DIAGNOSES  Final diagnoses:  Kidney stone    Medications  HYDROmorphone (DILAUDID) injection 1 mg (1 mg Intravenous Given 12/26/19 0413)  ondansetron (ZOFRAN) injection 4 mg (4 mg Intravenous Given 12/26/19 0413)  oxyCODONE-acetaminophen (PERCOCET/ROXICET) 5-325 MG per tablet 1 tablet (1 tablet Oral Given 12/26/19 0745)     ED Discharge Orders    None       Note:  This document was prepared using Dragon voice recognition software and may include unintentional dictation errors.   Lucrezia Starch, MD 12/26/19 [phone]

## 2019-12-26 NOTE — ED Triage Notes (Signed)
Pt reports he had bariatric surgery in October and since has been diagnosed with kidney stones. On 11/18, ptt had stent placed to push kidney stones back into kidneys until pt came off of blood thinner and could have stones blasted. Pt had stent removed 11/02. Pt to ED tonight due to right flank pain. Pt denies N/V. Pt denies urinary symptoms other than hematuria since stent removal.

## 2019-12-27 ENCOUNTER — Other Ambulatory Visit: Payer: Self-pay | Admitting: Internal Medicine

## 2019-12-27 LAB — URINE CULTURE

## 2019-12-28 ENCOUNTER — Other Ambulatory Visit
Admission: RE | Admit: 2019-12-28 | Discharge: 2019-12-28 | Disposition: A | Discharge status: Home / Self Care | Payer: PRIVATE HEALTH INSURANCE | Source: Ambulatory Visit | Attending: Urology | Admitting: Urology

## 2019-12-28 ENCOUNTER — Other Ambulatory Visit: Payer: Self-pay

## 2019-12-28 ENCOUNTER — Other Ambulatory Visit
Admission: RE | Admit: 2019-12-28 | Discharge: 2019-12-28 | Disposition: A | Discharge status: Home / Self Care | Payer: Self-pay | Source: Ambulatory Visit | Attending: Urology | Admitting: Urology

## 2019-12-28 DIAGNOSIS — Z01812 Encounter for preprocedural laboratory examination: Secondary | ICD-10-CM | POA: Diagnosis not present

## 2019-12-28 DIAGNOSIS — Z20822 Contact with and (suspected) exposure to covid-19: Secondary | ICD-10-CM | POA: Insufficient documentation

## 2019-12-29 ENCOUNTER — Ambulatory Visit: Payer: PRIVATE HEALTH INSURANCE

## 2019-12-29 LAB — SARS CORONAVIRUS 2 (TAT 6-24 HRS): SARS Coronavirus 2: NEGATIVE

## 2019-12-29 NOTE — H&P (Signed)
NAME: Richard [name], Richard [name] MEDICAL RECORD IR:[id number] ACCOUNT [id number] DATE OF BIRTH:01/14/1961 FACILITY: [facility] LOCATION: [facility] PHYSICIAN:Aking Farrel Conners, MD  HISTORY AND PHYSICAL  DATE OF ADMISSION:  12/30/2019  Same day surgery, 12/09.  CHIEF COMPLAINT:  Right ureteral stone.  HISTORY OF PRESENT ILLNESS:  The patient is a 59 year old white male who presented initially with an obstructing right ureteral stone associated with febrile UTI.  He underwent urgent stent placement, 11/18.  Stent was removed on 11/29, with the  intention of him passing the stone.  However, he has not passed the stone and actually developed recurrent right flank pain, requiring emergency room visit and injection of pain medication.  The patient's stone is radiolucent and not visible on regular  KUB.  He comes in now for right retrograde with right ureteroscopic ureterolithotomy with holmium laser lithotripsy and stent placement.  PAST MEDICAL HISTORY:  THE PATIENT WAS ALLERGIC TO PENICILLIN AND VANCOMYCIN.  CURRENT MEDICATIONS:  Tylenol, baclofen, vitamin B12, flecainide, metoprolol, Zofran, oxycodone, pantoprazole, Colace and Eliquis.  PAST SURGICAL HISTORY: 1.  Right total knee replacement in 1751 which was complicated by pulmonary embolus. 2.  Left total knee replacement in 2019. 3.  Appendectomy, 1980. 4.  Left shoulder reconstruction, 2020. 5.  Left carpal tunnel repair, 2020. 6.  Bariatric surgery, 10/27/2019. 7.  IVC filter placement in 2017, with removal in 2018. 8.  Hiatal hernia repair, 10/26/2019 9.  Cardiac catheterization, 2015.  PAST AND CURRENT MEDICAL CONDITIONS: 1.  Paroxysmal atrial fibrillation. 2.  Hypertension. 3.  Morbid obesity. 4.  Sleep apnea. 5.  Congestive heart failure. 6.  Degenerative arthritis. 7.  Distant history of pulmonary embolus. 8.  Kidney stones.  REVIEW OF SYSTEMS:  The patient denied chest pain, shortness of breath, diabetes or stroke.  SOCIAL  HISTORY:  The patient denied tobacco use.  He also denied alcohol use.  FAMILY HISTORY:  Mother died at age 64 of lung cancer.  Father is living with history of bladder cancer, which was treated.  Father also has heart disease.  PHYSICAL EXAMINATION: VITAL SIGNS:  Height 5 feet 9 inches, weight 264, BMI 39. GENERAL:  Obese white male in no acute distress. HEENT:  Sclerae were clear.  Pupils were equally round, reactive to light and accommodation.  Extraocular movements were intact. NECK:  No palpable thyroid nodules. LYMPHATIC:  No palpable inguinal or cervical adenopathy. PULMONARY:  Lungs clear to auscultation. CARDIOVASCULAR:  Regular rhythm and rate without audible murmurs. ABDOMEN:  Soft, nontender abdomen. GENITOURINARY AND RECTAL:  Deferred NEUROMUSCULAR:  Alert and oriented x3.  IMPRESSION:  Right proximal obstructing radiolucent ureteral stone with renal colic and hydronephrosis.  PLAN:  Right retrograde with right ureteroscopic ureterolithotomy and holmium laser lithotripsy and stent placement.  HN/NUANCE  D:12/27/2019 T:12/28/2019 JOB:[id number]

## 2019-12-30 ENCOUNTER — Ambulatory Visit: Payer: PRIVATE HEALTH INSURANCE | Admitting: Anesthesiology

## 2019-12-30 ENCOUNTER — Encounter
Admission: RE | Disposition: A | Discharge status: Home / Self Care | Payer: Self-pay | Source: Home / Self Care | Attending: Urology

## 2019-12-30 ENCOUNTER — Other Ambulatory Visit: Payer: Self-pay

## 2019-12-30 ENCOUNTER — Ambulatory Visit
Admission: RE | Admit: 2019-12-30 | Discharge: 2019-12-30 | Disposition: A | Discharge status: Home / Self Care | Payer: PRIVATE HEALTH INSURANCE | Attending: Urology | Admitting: Urology

## 2019-12-30 ENCOUNTER — Ambulatory Visit: Admission: RE | Admit: 2019-12-30 | Payer: Self-pay | Source: Home / Self Care | Admitting: Urology

## 2019-12-30 ENCOUNTER — Encounter: Admission: RE | Payer: Self-pay | Source: Home / Self Care

## 2019-12-30 ENCOUNTER — Ambulatory Visit: Payer: PRIVATE HEALTH INSURANCE

## 2019-12-30 ENCOUNTER — Encounter: Payer: Self-pay | Admitting: Urology

## 2019-12-30 DIAGNOSIS — N201 Calculus of ureter: Secondary | ICD-10-CM

## 2019-12-30 DIAGNOSIS — Z79899 Other long term (current) drug therapy: Secondary | ICD-10-CM | POA: Insufficient documentation

## 2019-12-30 DIAGNOSIS — Z86711 Personal history of pulmonary embolism: Secondary | ICD-10-CM | POA: Diagnosis not present

## 2019-12-30 DIAGNOSIS — Z881 Allergy status to other antibiotic agents status: Secondary | ICD-10-CM | POA: Diagnosis not present

## 2019-12-30 DIAGNOSIS — I5032 Chronic diastolic (congestive) heart failure: Secondary | ICD-10-CM | POA: Diagnosis not present

## 2019-12-30 DIAGNOSIS — K219 Gastro-esophageal reflux disease without esophagitis: Secondary | ICD-10-CM | POA: Diagnosis not present

## 2019-12-30 DIAGNOSIS — G4733 Obstructive sleep apnea (adult) (pediatric): Secondary | ICD-10-CM | POA: Diagnosis not present

## 2019-12-30 DIAGNOSIS — I110 Hypertensive heart disease with heart failure: Secondary | ICD-10-CM | POA: Diagnosis not present

## 2019-12-30 DIAGNOSIS — Z880 Allergy status to penicillin: Secondary | ICD-10-CM | POA: Diagnosis not present

## 2019-12-30 DIAGNOSIS — Z7901 Long term (current) use of anticoagulants: Secondary | ICD-10-CM | POA: Insufficient documentation

## 2019-12-30 DIAGNOSIS — Z9884 Bariatric surgery status: Secondary | ICD-10-CM | POA: Diagnosis not present

## 2019-12-30 DIAGNOSIS — N200 Calculus of kidney: Secondary | ICD-10-CM | POA: Diagnosis not present

## 2019-12-30 DIAGNOSIS — Z96653 Presence of artificial knee joint, bilateral: Secondary | ICD-10-CM | POA: Diagnosis not present

## 2019-12-30 DIAGNOSIS — N132 Hydronephrosis with renal and ureteral calculous obstruction: Secondary | ICD-10-CM | POA: Insufficient documentation

## 2019-12-30 HISTORY — PX: URETEROSCOPY WITH HOLMIUM LASER LITHOTRIPSY: SHX6645

## 2019-12-30 SURGERY — URETEROSCOPY, WITH LITHOTRIPSY USING HOLMIUM LASER
Anesthesia: General | Laterality: Right

## 2019-12-30 SURGERY — URETEROSCOPY, WITH LITHOTRIPSY USING HOLMIUM LASER
Anesthesia: Choice | Laterality: Right

## 2019-12-30 MED ORDER — DEXAMETHASONE SODIUM PHOSPHATE 10 MG/ML IJ SOLN
INTRAMUSCULAR | Status: DC | PRN
  Administered 2019-12-30: 10 mg via INTRAVENOUS

## 2019-12-30 MED ORDER — ORAL CARE MOUTH RINSE: 15.0000 mL | Freq: Once | OROMUCOSAL | Status: AC

## 2019-12-30 MED ORDER — CIPROFLOXACIN HCL 500 MG PO TABS: 500.0000 mg | ORAL_TABLET | Freq: Two times a day (BID) | ORAL | 0 refills | Status: DC

## 2019-12-30 MED ORDER — ROCURONIUM BROMIDE 100 MG/10ML IV SOLN
INTRAVENOUS | Status: DC | PRN
  Administered 2019-12-30: 50 mg via INTRAVENOUS

## 2019-12-30 MED ORDER — LEVOFLOXACIN IN D5W 500 MG/100ML IV SOLN
INTRAVENOUS | Status: AC
  Filled 2019-12-30: qty 100

## 2019-12-30 MED ORDER — PROPOFOL 10 MG/ML IV BOLUS
INTRAVENOUS | Status: DC | PRN
  Administered 2019-12-30: 150 mg via INTRAVENOUS

## 2019-12-30 MED ORDER — URIBEL 118 MG PO CAPS: 1.0000 | ORAL_CAPSULE | Freq: Four times a day (QID) | ORAL | 3 refills | Status: DC | PRN

## 2019-12-30 MED ORDER — LACTATED RINGERS IV SOLN: INTRAVENOUS | Status: DC

## 2019-12-30 MED ORDER — FENTANYL CITRATE (PF) 100 MCG/2ML IJ SOLN
INTRAMUSCULAR | Status: DC | PRN
  Administered 2019-12-30: 100 ug via INTRAVENOUS

## 2019-12-30 MED ORDER — LEVOFLOXACIN IN D5W 500 MG/100ML IV SOLN
500.0000 mg | INTRAVENOUS | Status: DC
  Administered 2019-12-30: 500 mg via INTRAVENOUS

## 2019-12-30 MED ORDER — LIDOCAINE HCL (CARDIAC) PF 100 MG/5ML IV SOSY
PREFILLED_SYRINGE | INTRAVENOUS | Status: DC | PRN
  Administered 2019-12-30: 50 mg via INTRAVENOUS

## 2019-12-30 MED ORDER — LIDOCAINE HCL URETHRAL/MUCOSAL 2 % EX GEL
CUTANEOUS | Status: DC | PRN
  Administered 2019-12-30: 1

## 2019-12-30 MED ORDER — SUCCINYLCHOLINE CHLORIDE 20 MG/ML IJ SOLN
INTRAMUSCULAR | Status: DC | PRN
  Administered 2019-12-30: 100 mg via INTRAVENOUS

## 2019-12-30 MED ORDER — ONDANSETRON HCL 4 MG/2ML IJ SOLN
INTRAMUSCULAR | Status: DC | PRN
  Administered 2019-12-30: 4 mg via INTRAVENOUS

## 2019-12-30 MED ORDER — CHLORHEXIDINE GLUCONATE 0.12 % MT SOLN
15.0000 mL | Freq: Once | OROMUCOSAL | Status: AC
  Administered 2019-12-30: 15 mL via OROMUCOSAL

## 2019-12-30 MED ORDER — BELLADONNA ALKALOIDS-OPIUM 16.2-60 MG RE SUPP
RECTAL | Status: AC
  Filled 2019-12-30: qty 1

## 2019-12-30 MED ORDER — ONDANSETRON HCL 4 MG/2ML IJ SOLN: 4.0000 mg | Freq: Once | INTRAMUSCULAR | Status: DC | PRN

## 2019-12-30 MED ORDER — GENTAMICIN IN SALINE 1.6-0.9 MG/ML-% IV SOLN
80.0000 mg | INTRAVENOUS | Status: AC
  Administered 2019-12-30: 80 mg via INTRAVENOUS
  Filled 2019-12-30: qty 50

## 2019-12-30 MED ORDER — ACETAMINOPHEN 10 MG/ML IV SOLN
INTRAVENOUS | Status: DC | PRN
  Administered 2019-12-30: 1000 mg via INTRAVENOUS

## 2019-12-30 MED ORDER — OXYCODONE-ACETAMINOPHEN 10-325 MG PO TABS
1.0000 | ORAL_TABLET | ORAL | 0 refills | Status: DC | PRN
Start: 2019-12-30 — End: 2020-01-17

## 2019-12-30 MED ORDER — LIDOCAINE HCL URETHRAL/MUCOSAL 2 % EX GEL
CUTANEOUS | Status: AC
  Filled 2019-12-30: qty 10

## 2019-12-30 MED ORDER — MIDAZOLAM HCL 2 MG/2ML IJ SOLN
INTRAMUSCULAR | Status: DC | PRN
  Administered 2019-12-30: 2 mg via INTRAVENOUS

## 2019-12-30 MED ORDER — CHLORHEXIDINE GLUCONATE 0.12 % MT SOLN
OROMUCOSAL | Status: AC
  Filled 2019-12-30: qty 15

## 2019-12-30 MED ORDER — BELLADONNA ALKALOIDS-OPIUM 16.2-60 MG RE SUPP
RECTAL | Status: DC | PRN
  Administered 2019-12-30: 1 via RECTAL

## 2019-12-30 MED ORDER — EPHEDRINE SULFATE 50 MG/ML IJ SOLN
INTRAMUSCULAR | Status: DC | PRN
  Administered 2019-12-30: 25 mg via INTRAVENOUS

## 2019-12-30 MED ORDER — FENTANYL CITRATE (PF) 100 MCG/2ML IJ SOLN: 25.0000 ug | INTRAMUSCULAR | Status: DC | PRN

## 2019-12-30 MED ORDER — SUGAMMADEX SODIUM 500 MG/5ML IV SOLN
INTRAVENOUS | Status: DC | PRN
  Administered 2019-12-30: 200 mg via INTRAVENOUS

## 2019-12-30 SURGICAL SUPPLY — 26 items
BAG DRAIN CYSTO-URO LG1000N (MISCELLANEOUS) ×2 IMPLANT
BRUSH SCRUB EZ 1% IODOPHOR (MISCELLANEOUS) ×2 IMPLANT
CATH URETL 5X70 OPEN END (CATHETERS) ×2 IMPLANT
CNTNR SPEC 2.5X3XGRAD LEK (MISCELLANEOUS)
CONT SPEC 4OZ STER OR WHT (MISCELLANEOUS)
CONT SPEC 4OZ STRL OR WHT (MISCELLANEOUS)
CONTAINER SPEC 2.5X3XGRAD LEK (MISCELLANEOUS) IMPLANT
COVER WAND RF STERILE (DRAPES) ×2 IMPLANT
FIBER LASER FLEXIVA 365 (UROLOGICAL SUPPLIES) ×1 IMPLANT
GLOVE BIO SURGEON STRL SZ7.5 (GLOVE) ×2 IMPLANT
GOWN STRL REUS W/ TWL LRG LVL3 (GOWN DISPOSABLE) ×1 IMPLANT
GOWN STRL REUS W/ TWL XL LVL3 (GOWN DISPOSABLE) ×1 IMPLANT
GOWN STRL REUS W/TWL LRG LVL3 (GOWN DISPOSABLE) ×2
GOWN STRL REUS W/TWL XL LVL3 (GOWN DISPOSABLE) ×2
GUIDEWIRE STR ZIPWIRE 035X150 (MISCELLANEOUS) ×2 IMPLANT
KIT TURNOVER CYSTO (KITS) ×2 IMPLANT
MANIFOLD NEPTUNE II (INSTRUMENTS) ×2 IMPLANT
PACK CYSTO AR (MISCELLANEOUS) ×2 IMPLANT
SET CYSTO W/LG BORE CLAMP LF (SET/KITS/TRAYS/PACK) ×2 IMPLANT
SOL .9 NS 3000ML IRR  AL (IV SOLUTION) ×1
SOL .9 NS 3000ML IRR AL (IV SOLUTION) ×1
SOL .9 NS 3000ML IRR UROMATIC (IV SOLUTION) ×1 IMPLANT
STENT URET 6FRX26 CONTOUR (STENTS) ×1 IMPLANT
SURGILUBE 2OZ TUBE FLIPTOP (MISCELLANEOUS) ×2 IMPLANT
SYR 10ML LL (SYRINGE) ×2 IMPLANT
WATER STERILE IRR 1000ML POUR (IV SOLUTION) ×2 IMPLANT

## 2019-12-30 NOTE — Op Note (Signed)
Preoperative diagnosis: Right ureterolithiasis with hydronephrosis (N13.2)  Postoperative diagnosis: Same  Procedure: 1.  Right ureteroscopic ureterolithotomy with holmium laser lithotripsy (CPT 52356)                      2.  Right double-pigtail ureteral stent placement (CPT 32122)                      3.  Fluoroscopy (CPT 76000)  Surgeon: Otelia Limes. Yves Dill MD  Anesthesia: General  Indications:See the history and physical. After informed consent the above procedure(s) were requested     Technique and findings: After adequate general anesthesia been obtained the patient was placed into dorsal lithotomy position and the perineum was prepped and draped in the usual fashion.  The 21 French cystoscope was coupled to the camera and then visually advanced into the bladder.  Patient had minimal prostatic hypertrophy.  Bladder was thoroughly inspected and no bladder lesions were identified.  The right ureteral orifice was edematous and the tip of the stone was seen the orifice.  Fluoroscopy did not reveal a calcified stone.  The cystoscope was then positioned over the orifice and the inner 70 m holmium fiber was introduced through the scope and passed to the level of the stone.  Power was set at 0.5 J and frequency of 10 Hz.  The the visible part of the stone was then disintegrated.  The cystoscope was exchanged for the short mini semirigid ureteroscope.  Ureteroscope was then positioned in the distal right ureter the laser fiber passed to the stone.  Stone was then fully fragmented to pieces less than 1 mm in size.  Ureteroscope was then easily passed up to mid ureter and no other stones were identified.  A 0.035 Glidewire was passed through the ureteroscope and positioned in the ureter under fluoroscopic guidance.  Ureteroscope was then removed taking care leave the guidewire in position.  The cystoscope was backloaded over the guidewire and a 6 x 26 cm double-pigtail ureteral stent with retrieval suture  passed up the ureter over the guidewire.  Guidewire was removed taking care leave the stent in good position.  The retrieval suture was left attached to the stent.  The bladder was then drained and cystoscope was removed.  10 cc of viscous Xylocaine was instilled within the urethra.  A B&O suppository was placed.  The procedure was then terminated and patient transferred to the recovery room in stable condition.

## 2019-12-30 NOTE — Discharge Instructions (Addendum)
Laser Therapy for Kidney Stones, Care After This sheet gives you information about how to care for yourself after your procedure. Your health care provider may also give you more specific instructions. If you have problems or questions, contact your health care provider. What can I expect after the procedure? After the procedure, it is common to have:  Pain.  A burning sensation while urinating.  Small amounts of blood in your urine.  A need to urinate frequently.  Pieces of kidney stone in your urine.  Mild discomfort when urinating that may be felt in the back. You may experience this if you have a flexible tube (stent) in your ureter. Follow these instructions at home:  Medicines  Take over-the-counter and prescription medicines only as told by your health care provider.  If you were prescribed an antibiotic medicine, take it as told by your health care provider. Do not stop taking the antibiotic even if you start to feel better.  Ask your health care provider if the medicine prescribed to you: ? Requires you to avoid driving or using heavy machinery. ? Can cause constipation. You may need to take actions to prevent or treat constipation, such as:  Take over-the-counter or prescription medicines.  Eat foods that are high in fiber, such as beans, whole grains, and fresh fruits and vegetables.  Limit foods that are high in fat and processed sugars, such as fried or sweet foods. Activity  Return to your normal activities as told by your health care provider. Ask your health care provider what activities are safe for you.  Do not drive for 24 hours if you were given a sedative during your procedure. General instructions  If your health care provider approves, you may take a warm bath to ease discomfort and burning.  Drink enough fluid to keep your urine pale yellow. Your health care provider may recommend drinking two 8 oz (237 mL) glasses of water per hour for a few hours  after your procedure.  You may be asked to strain your urine to collect any stone fragments that you pass. These fragments may be tested.  Keep all follow-up visits as told by your health care provider. This is important. If you have a stent, you will need to return to your health care provider to have the stent removed. Contact a health care provider if you:  Have pain or a burning feeling that lasts more than 2 days.  Feel nauseous.  Vomit more and more often.  Have difficulty urinating.  Have pain that gets worse or does not get better with medicine. Get help right away if:  You are unable to urinate, even if your bladder feels full.  You have: ? Bright red blood or blood clots in your urine. ? More blood in your urine. ? Severe pain or discomfort. ? A fever or shaking chills. ? Abdominal pain. ? Difficulty breathing. ? Swelling in your legs. Summary  After the procedure, it is common to have a burning sensation while urinating and small amounts of blood in your urine.  Take over-the-counter and prescription medicines only as told by your health care provider.  Drink enough fluid to keep your urine pale yellow.  Keep all follow-up visits as told by your health care provider. This is important. This information is not intended to replace advice given to you by your health care provider. Make sure you discuss any questions you have with your health care provider. Document Revised: 09/18/2017 Document Reviewed: 09/18/2017 Elsevier  Patient Education  [employer]. Ureteroscopy Ureteroscopy is a procedure to check for and treat problems inside part of the urinary tract. In this procedure, a thin, tube-shaped instrument with a light at the end (ureteroscope) is used to look at the inside of the kidneys and the ureters, which are the tubes that carry urine from the kidneys to the bladder. The ureteroscope is inserted into one or both of the ureters. You may need this  procedure if you have frequent urinary tract infections (UTIs), blood in your urine, or a stone in one of your ureters. A ureteroscopy can be done to find the cause of urine blockage in a ureter and to evaluate other abnormalities inside the ureters or kidneys. If stones are found, they can be removed during the procedure. Polyps, abnormal tissue, and some types of tumors can also be removed or treated. The ureteroscope may also have a tool to remove tissue to be checked for disease under a microscope (biopsy). Tell a health care provider about:  Any allergies you have.  All medicines you are taking, including vitamins, herbs, eye drops, creams, and over-the-counter medicines.  Any problems you or family members have had with anesthetic medicines.  Any blood disorders you have.  Any surgeries you have had.  Any medical conditions you have.  Whether you are pregnant or may be pregnant. What are the risks? Generally, this is a safe procedure. However, problems may occur, including:  Bleeding.  Infection.  Allergic reactions to medicines.  Scarring that narrows the ureter (stricture).  Creating a hole in the ureter (perforation). What happens before the procedure? Staying hydrated Follow instructions from your health care provider about hydration, which may include:  Up to 2 hours before the procedure - you may continue to drink clear liquids, such as water, clear fruit juice, black coffee, and plain tea. Eating and drinking restrictions Follow instructions from your health care provider about eating and drinking, which may include:  8 hours before the procedure - stop eating heavy meals or foods such as meat, fried foods, or fatty foods.  6 hours before the procedure - stop eating light meals or foods, such as toast or cereal.  6 hours before the procedure - stop drinking milk or drinks that contain milk.  2 hours before the procedure - stop drinking clear  liquids. Medicines  Ask your health care provider about: ? Changing or stopping your regular medicines. This is especially important if you are taking diabetes medicines or blood thinners. ? Taking medicines such as aspirin and ibuprofen. These medicines can thin your blood. Do not take these medicines before your procedure if your health care provider instructs you not to.  You may be given antibiotic medicine to help prevent infection. General instructions  You may have a urine sample taken to check for infection.  Plan to have someone take you home from the hospital or clinic. What happens during the procedure?   To reduce your risk of infection: ? Your health care team will wash or sanitize their hands. ? Your skin will be washed with soap.  An IV tube will be inserted into one of your veins.  You will be given one of the following: ? A medicine to help you relax (sedative). ? A medicine to make you fall asleep (general anesthetic). ? A medicine that is injected into your spine to numb the area below and slightly above the injection site (spinal anesthetic).  To lower your risk of infection,  you may be given an antibiotic medicine by an injection or through the IV tube.  The opening from which you urinate (urethra) will be cleaned with a germ-killing solution.  The ureteroscope will be passed through your urethra into your bladder.  A salt-water solution will flow through the ureteroscope to fill your bladder. This will help the health care provider see the openings of your ureters more clearly.  Then, the ureteroscope will be passed into your ureter. ? If a growth is found, a piece of it may be removed so it can be examined under a microscope (biopsy). ? If a stone is found, it may be removed through the ureteroscope, or the stone may be broken up using a laser, shock waves, or electrical energy. ? In some cases, if the ureter is too small, a tube may be inserted that keeps  the ureter open (ureteral stent). The stent may be left in place for 1 or 2 weeks to keep the ureter open, and then the ureteroscopy procedure will be performed.  The scope will be removed, and your bladder will be emptied. The procedure may vary among health care providers and hospitals. What happens after the procedure?  Your blood pressure, heart rate, breathing rate, and blood oxygen level will be monitored until the medicines you were given have worn off.  You may be asked to urinate.  Donot drive for 24 hours if you were given a sedative. This information is not intended to replace advice given to you by your health care provider. Make sure you discuss any questions you have with your health care provider. Document Revised: 12/20/2016 Document Reviewed: 10/20/2015 Elsevier Patient Education  2020 [employer]. Kidney Stones Kidney stones are rock-like masses that form inside of the kidneys. Kidneys are organs that make pee (urine). A kidney stone may move into other parts of the urinary tract, including:  The tubes that connect the kidneys to the bladder (ureters).  The bladder.  The tube that carries urine out of the body (urethra). Kidney stones can cause very bad pain and can block the flow of pee. The stone usually leaves your body (passes) through your pee. You may need to have a doctor take out the stone. What are the causes? Kidney stones may be caused by:  A condition in which certain glands make too much parathyroid hormone (primary hyperparathyroidism).  A buildup of a type of crystals in the bladder made of a chemical called uric acid. The body makes uric acid when you eat certain foods.  Narrowing (stricture) of one or both of the ureters.  A kidney blockage that you were born with.  Past surgery on the kidney or the ureters, such as gastric bypass surgery. What increases the risk? You are more likely to develop this condition if:  You have had a kidney stone  in the past.  You have a family history of kidney stones.  You do not drink enough water.  You eat a diet that is high in protein, salt (sodium), or sugar.  You are overweight or very overweight (obese). What are the signs or symptoms? Symptoms of a kidney stone may include:  Pain in the side of the belly, right below the ribs (flank pain). Pain usually spreads (radiates) to the groin.  Needing to pee often or right away (urgently).  Pain when going pee (urinating).  Blood in your pee (hematuria).  Feeling like you may vomit (nauseous).  Vomiting.  Fever and chills. How is  this treated? Treatment depends on the size, location, and makeup of the kidney stones. The stones will often pass out of the body through peeing. You may need to:  Drink more fluid to help pass the stone. In some cases, you may be given fluids through an IV tube put into one of your veins at the hospital.  Take medicine for pain.  Make changes in your diet to help keep kidney stones from coming back. Sometimes, medical procedures are needed to remove a kidney stone. This may involve:  A procedure to break up kidney stones using a beam of light (laser) or shock waves.  Surgery to remove the kidney stones. Follow these instructions at home: Medicines  Take over-the-counter and prescription medicines only as told by your doctor.  Ask your doctor if the medicine prescribed to you requires you to avoid driving or using heavy machinery. Eating and drinking  Drink enough fluid to keep your pee pale yellow. You may be told to drink at least 8-10 glasses of water each day. This will help you pass the stone.  If told by your doctor, change your diet. This may include: ? Limiting how much salt you eat. ? Eating more fruits and vegetables. ? Limiting how much meat, poultry, fish, and eggs you eat.  Follow instructions from your doctor about eating or drinking restrictions. General instructions  Collect  pee samples as told by your doctor. You may need to collect a pee sample: ? 24 hours after a stone comes out. ? 8-12 weeks after a stone comes out, and every 6-12 months after that.  Strain your pee every time you pee (urinate), for as long as told. Use the strainer that your doctor recommends.  Do not throw out the stone. Keep it so that it can be tested by your doctor.  Keep all follow-up visits as told by your doctor. This is important. You may need follow-up tests. How is this prevented? To prevent another kidney stone:  Drink enough fluid to keep your pee pale yellow. This is the best way to prevent kidney stones.  Eat healthy foods.  Avoid certain foods as told by your doctor. You may be told to eat less protein.  Stay at a healthy weight. Where to find more information  [place] [facility]): www.kidney.[place] [facility]): www.urologyhealth.org Contact a doctor if:  You have pain that gets worse or does not get better with medicine. Get help right away if:  You have a fever or chills.  You get very bad pain.  You get new pain in your belly (abdomen).  You pass out (faint).  You cannot pee. Summary  Kidney stones are rock-like masses that form inside of the kidneys.  Kidney stones can cause very bad pain and can block the flow of pee.  The stones will often pass out of the body through peeing.  Drink enough fluid to keep your pee pale yellow. This information is not intended to replace advice given to you by your health care provider. Make sure you discuss any questions you have with your health care provider. Document Revised: 05/26/2018 Document Reviewed: 05/26/2018 Elsevier Patient Education  [place] [employer]. Ureteral Stent Implantation, Care After This sheet gives you information about how to care for yourself after your procedure. Your health care provider may also give you more specific instructions. If you have problems or  questions, contact your health care provider. What can I expect after the procedure? After  the procedure, it is common to have:  Nausea.  Mild pain when you urinate. You may feel this pain in your lower back or lower abdomen. The pain should stop within a few minutes after you urinate. This may last for up to 1 week.  A small amount of blood in your urine for several days. Follow these instructions at home: Medicines  Take over-the-counter and prescription medicines only as told by your health care provider.  If you were prescribed an antibiotic medicine, take it as told by your health care provider. Do not stop taking the antibiotic even if you start to feel better.  Do not drive for 24 hours if you were given a sedative during your procedure.  Ask your health care provider if the medicine prescribed to you requires you to avoid driving or using heavy machinery. Activity  Rest as told by your health care provider.  Avoid sitting for a long time without moving. Get up to take short walks every 1-2 hours. This is important to improve blood flow and breathing. Ask for help if you feel weak or unsteady.  Return to your normal activities as told by your health care provider. Ask your health care provider what activities are safe for you. General instructions   Watch for any blood in your urine. Call your health care provider if the amount of blood in your urine increases.  If you have a catheter: ? Follow instructions from your health care provider about taking care of your catheter and collection bag. ? Do not take baths, swim, or use a hot tub until your health care provider approves. Ask your health care provider if you may take showers. You may only be allowed to take sponge baths.  Drink enough fluid to keep your urine pale yellow.  Do not use any products that contain nicotine or tobacco, such as cigarettes, e-cigarettes, and chewing tobacco. These can delay healing after  surgery. If you need help quitting, ask your health care provider.  Keep all follow-up visits as told by your health care provider. This is important. Contact a health care provider if:  You have pain that gets worse or does not get better with medicine, especially pain when you urinate.  You have difficulty urinating.  You feel nauseous or you vomit repeatedly during a period of more than 2 days after the procedure. Get help right away if:  Your urine is dark red or has blood clots in it.  You are leaking urine (have incontinence).  The end of the stent comes out of your urethra.  You cannot urinate.  You have sudden, sharp, or severe pain in your abdomen or lower back.  You have a fever.  You have swelling or pain in your legs.  You have difficulty breathing. Summary  After the procedure, it is common to have mild pain when you urinate that goes away within a few minutes after you urinate. This may last for up to 1 week.  Watch for any blood in your urine. Call your health care provider if the amount of blood in your urine increases.  Take over-the-counter and prescription medicines only as told by your health care provider.  Drink enough fluid to keep your urine pale yellow. This information is not intended to replace advice given to you by your health care provider. Make sure you discuss any questions you have with your health care provider. Document Revised: 10/14/2017 Document Reviewed: 10/15/2017 Elsevier Patient Education  2020 [employer]   AMBULATORY SURGERY  DISCHARGE INSTRUCTIONS   1) The drugs that you were given will stay in your system until tomorrow so for the next 24 hours you should not:  A) Drive an automobile B) Make any legal decisions C) Drink any alcoholic beverage   2) You may resume regular meals tomorrow.  Today it is better to start with liquids and gradually work up to solid foods.  You may eat anything you prefer, but it is better  to start with liquids, then soup and crackers, and gradually work up to solid foods.   3) Please notify your doctor immediately if you have any unusual bleeding, trouble breathing, redness and pain at the surgery site, drainage, fever, or pain not relieved by medication.    4) Additional Instructions:  DO NOT PULL ON STRING COMING OUT OF YOUR URETHRA.        Please contact your physician with any problems or Same Day Surgery at [phone], Monday through Friday 6 am to 4 pm, or [place] at [facility] number at [phone].

## 2019-12-30 NOTE — Transfer of Care (Signed)
Immediate Anesthesia Transfer of Care Note  Patient: Richard [name]  Procedure(s) Performed: URETEROSCOPY WITH HOLMIUM LASER LITHOTRIPSY (Right )  Patient Location: PACU  Anesthesia Type:General  Level of Consciousness: awake  Airway & Oxygen Therapy: Patient Spontanous Breathing and Patient connected to face mask oxygen  Post-op Assessment: Report given to RN  Post vital signs: Reviewed and stable  Last Vitals:  Vitals Value Taken Time  BP 137/60 12/30/19 1427  Temp    Pulse 55 12/30/19 1428  Resp 15 12/30/19 1428  SpO2 100 % 12/30/19 1428  Vitals shown include unvalidated device data.  Last Pain:  Vitals:   12/30/19 1206  TempSrc: Oral  PainSc: 0-No pain         Complications: No complications documented.

## 2019-12-30 NOTE — Progress Notes (Signed)
Dr. Andree Elk at bedside and reviewed EKG.

## 2019-12-30 NOTE — OR Nursing (Signed)
May resume eliquis tomorrow 12/31/19 per Dr. Yves Dill, tele via [facility] staff in room #10.

## 2019-12-30 NOTE — Anesthesia Procedure Notes (Signed)
Procedure Name: Intubation Performed by: Gaynelle Cage, CRNA Pre-anesthesia Checklist: Patient identified, Emergency Drugs available, Suction available and Patient being monitored Patient Re-evaluated:Patient Re-evaluated prior to induction Oxygen Delivery Method: Circle system utilized Preoxygenation: Pre-oxygenation with 100% oxygen Induction Type: IV induction Laryngoscope Size: McGraph and 3 Tube type: Oral Tube size: 7.5 mm Number of attempts: 1 Airway Equipment and Method: Stylet Placement Confirmation: ETT inserted through vocal cords under direct vision,  positive ETCO2 and breath sounds checked- equal and bilateral Secured at: 23 cm Tube secured with: Tape Dental Injury: Teeth and Oropharynx as per pre-operative assessment

## 2019-12-30 NOTE — H&P (Signed)
Date of Initial H&P: 12/27/19  History reviewed, patient examined, no change in status, stable for surgery.

## 2019-12-30 NOTE — Anesthesia Preprocedure Evaluation (Signed)
Anesthesia Evaluation  Patient identified by MRN, date of birth, ID band Patient awake    Reviewed: Allergy & Precautions, H&P , NPO status , Patient's Chart, lab work & pertinent test results, reviewed documented beta blocker date and time   Airway Mallampati: II  TM Distance: >3 FB Neck ROM: full    Dental  (+) Teeth Intact   Pulmonary shortness of breath and with exertion, sleep apnea and Continuous Positive Airway Pressure Ventilation , pneumonia, resolved,    Pulmonary exam normal        Cardiovascular Exercise Tolerance: Good hypertension, On Medications +CHF  + dysrhythmias Atrial Fibrillation  Rate:Normal     Neuro/Psych  Headaches, negative psych ROS   GI/Hepatic Neg liver ROS, GERD  Medicated,  Endo/Other  negative endocrine ROS  Renal/GU negative Renal ROS  negative genitourinary   Musculoskeletal   Abdominal   Peds  Hematology negative hematology ROS (+)   Anesthesia Other Findings   Reproductive/Obstetrics negative OB ROS                             Anesthesia Physical Anesthesia Plan  ASA: III  Anesthesia Plan: General LMA   Post-op Pain Management:    Induction:   PONV Risk Score and Plan:   Airway Management Planned:   Additional Equipment:   Intra-op Plan:   Post-operative Plan:   Informed Consent: I have reviewed the patients History and Physical, chart, labs and discussed the procedure including the risks, benefits and alternatives for the proposed anesthesia with the patient or authorized representative who has indicated his/her understanding and acceptance.       Plan Discussed with: CRNA  Anesthesia Plan Comments:         Anesthesia Quick Evaluation

## 2019-12-31 ENCOUNTER — Encounter: Payer: Self-pay | Admitting: Urology

## 2020-01-03 NOTE — Anesthesia Postprocedure Evaluation (Signed)
Anesthesia Post Note  Patient: Richard [name]  Procedure(s) Performed: URETEROSCOPY WITH HOLMIUM LASER LITHOTRIPSY (Right )  Patient location during evaluation: PACU Anesthesia Type: General Level of consciousness: awake and alert Pain management: pain level controlled Vital Signs Assessment: post-procedure vital signs reviewed and stable Respiratory status: spontaneous breathing, nonlabored ventilation, respiratory function stable and patient connected to nasal cannula oxygen Cardiovascular status: blood pressure returned to baseline and stable Postop Assessment: no apparent nausea or vomiting Anesthetic complications: no   No complications documented.   Last Vitals:  Vitals:   12/30/19 1442 12/30/19 1509  BP: (!) 148/66 137/72  Pulse: (!) 55 (!) 50  Resp: 11 16  Temp: 36.6 C 36.4 C  SpO2: 95% 95%    Last Pain:  Vitals:   12/31/19 0802  TempSrc:   PainSc: [place]

## 2020-01-12 ENCOUNTER — Ambulatory Visit (INDEPENDENT_AMBULATORY_CARE_PROVIDER_SITE_OTHER): Payer: PRIVATE HEALTH INSURANCE

## 2020-01-12 ENCOUNTER — Other Ambulatory Visit: Payer: Self-pay

## 2020-01-12 DIAGNOSIS — E538 Deficiency of other specified B group vitamins: Secondary | ICD-10-CM

## 2020-01-12 MED ORDER — CYANOCOBALAMIN 1000 MCG/ML IJ SOLN
1000.0000 ug | Freq: Once | INTRAMUSCULAR | Status: AC
  Administered 2020-01-12: 1000 ug via INTRAMUSCULAR

## 2020-01-12 NOTE — Progress Notes (Signed)
Per orders of Webb Silversmith, NP, injection of B12, given by Aneta Mins, RN. Patient tolerated injection well in L Deltoid.

## 2020-01-17 ENCOUNTER — Encounter: Payer: Self-pay | Admitting: Internal Medicine

## 2020-01-17 ENCOUNTER — Other Ambulatory Visit: Payer: Self-pay

## 2020-01-17 ENCOUNTER — Ambulatory Visit (INDEPENDENT_AMBULATORY_CARE_PROVIDER_SITE_OTHER): Payer: PRIVATE HEALTH INSURANCE | Admitting: Internal Medicine

## 2020-01-17 VITALS — BP 124/60 | HR 47 | Temp 97.1°F | Ht 69.0 in | Wt 242.8 lb

## 2020-01-17 DIAGNOSIS — M159 Polyosteoarthritis, unspecified: Secondary | ICD-10-CM

## 2020-01-17 DIAGNOSIS — G4733 Obstructive sleep apnea (adult) (pediatric): Secondary | ICD-10-CM

## 2020-01-17 DIAGNOSIS — R7303 Prediabetes: Secondary | ICD-10-CM | POA: Diagnosis not present

## 2020-01-17 DIAGNOSIS — Z125 Encounter for screening for malignant neoplasm of prostate: Secondary | ICD-10-CM

## 2020-01-17 DIAGNOSIS — I10 Essential (primary) hypertension: Secondary | ICD-10-CM

## 2020-01-17 DIAGNOSIS — Z86711 Personal history of pulmonary embolism: Secondary | ICD-10-CM

## 2020-01-17 DIAGNOSIS — I82452 Acute embolism and thrombosis of left peroneal vein: Secondary | ICD-10-CM

## 2020-01-17 DIAGNOSIS — E538 Deficiency of other specified B group vitamins: Secondary | ICD-10-CM

## 2020-01-17 DIAGNOSIS — Z0000 Encounter for general adult medical examination without abnormal findings: Secondary | ICD-10-CM | POA: Diagnosis not present

## 2020-01-17 DIAGNOSIS — Z23 Encounter for immunization: Secondary | ICD-10-CM | POA: Diagnosis not present

## 2020-01-17 DIAGNOSIS — M8949 Other hypertrophic osteoarthropathy, multiple sites: Secondary | ICD-10-CM | POA: Diagnosis not present

## 2020-01-17 DIAGNOSIS — I5032 Chronic diastolic (congestive) heart failure: Secondary | ICD-10-CM

## 2020-01-17 DIAGNOSIS — K219 Gastro-esophageal reflux disease without esophagitis: Secondary | ICD-10-CM

## 2020-01-17 DIAGNOSIS — Z1211 Encounter for screening for malignant neoplasm of colon: Secondary | ICD-10-CM | POA: Diagnosis not present

## 2020-01-17 LAB — COMPREHENSIVE METABOLIC PANEL
ALT: 19 U/L (ref 0–53)
AST: 20 U/L (ref 0–37)
Albumin: 4.3 g/dL (ref 3.5–5.2)
Alkaline Phosphatase: 74 U/L (ref 39–117)
BUN: 16 mg/dL (ref 6–23)
CO2: 29 mEq/L (ref 19–32)
Calcium: 9.5 mg/dL (ref 8.4–10.5)
Chloride: 106 mEq/L (ref 96–112)
Creatinine, Ser: 1.3 mg/dL (ref 0.40–1.50)
GFR: 60.38 mL/min (ref >60.00)
Glucose, Bld: 83 mg/dL (ref 70–99)
Potassium: 4.3 mEq/L (ref 3.5–5.1)
Sodium: 142 mEq/L (ref 135–145)
Total Bilirubin: 0.8 mg/dL (ref 0.2–1.2)
Total Protein: 6.7 g/dL (ref 6.0–8.3)

## 2020-01-17 LAB — CBC
HCT: 44.8 % (ref 39.0–52.0)
Hemoglobin: 15.1 g/dL (ref 13.0–17.0)
MCHC: 33.6 g/dL (ref 30.0–36.0)
MCV: 90.9 fl (ref 78.0–100.0)
Platelets: 178 10*3/uL (ref 150.0–400.0)
RBC: 4.92 Mil/uL (ref 4.22–5.81)
RDW: 14 % (ref 11.5–15.5)
WBC: 7.5 10*3/uL (ref 4.0–10.5)

## 2020-01-17 LAB — LIPID PANEL
Cholesterol: 162 mg/dL (ref 0–200)
HDL: 39.8 mg/dL (ref >39.00)
LDL Cholesterol: 100 mg/dL — ABNORMAL HIGH (ref 0–99)
NonHDL: 122.61
Total CHOL/HDL Ratio: 4
Triglycerides: 112 mg/dL (ref 0.0–149.0)
VLDL: 22.4 mg/dL (ref 0.0–40.0)

## 2020-01-17 LAB — PSA, MEDICARE: PSA: 0.26 ng/ml (ref 0.10–4.00)

## 2020-01-17 LAB — HEMOGLOBIN A1C: Hgb A1c MFr Bld: 4.9 % (ref 4.6–6.5)

## 2020-01-17 LAB — VITAMIN B12: Vitamin B-12: 633 pg/mL (ref 211–911)

## 2020-01-17 NOTE — Assessment & Plan Note (Signed)
Compensated but bradycardic We will reach out to Dr. Fletcher Anon to see if we could decrease his metoprolol dose Encourage low-salt diet, daily weights Continue flecainide, furosemide and metoprolol for now

## 2020-01-17 NOTE — Assessment & Plan Note (Signed)
B12 level today  

## 2020-01-17 NOTE — Patient Instructions (Signed)

## 2020-01-17 NOTE — Assessment & Plan Note (Signed)
Controlled on flecainide, furosemide and metoprolol We will monitor

## 2020-01-17 NOTE — Assessment & Plan Note (Signed)
Continue baclofen, gabapentin and tramadol as needed Continue to monitor

## 2020-01-17 NOTE — Assessment & Plan Note (Signed)
Encouraged continued weight loss Continue CPAP

## 2020-01-17 NOTE — Progress Notes (Signed)
HPI:  Patient presents the clinic today for his subsequent annual Medicare wellness exam.  He is also due to follow-up chronic conditions.  OA: Mainly in his hands, elbows, hips and knees.  He takes Baclofen, Gabapentin and Tramadol as needed with good relief of symptoms.  History of DVT/PE: On lifelong Eliquis.  He denies S/S of bleeding.  CHF, Diastolic: Compensated.  He is taking Flecainide, Furosemide (as needed) and Metoprolol as prescribed.  Echo from 03/2015 reviewed.  He does follow with cardiology.  GERD: Intermittent.  He takes Pantoprazole with good relief of symptoms.  There is no upper GI on file.  HTN: His BP today is 124/60 .  He is taking Flecainide, Furosemide and Metoprolol as prescribed.  ECG from 12/2019 reviewed.  OSA: He averages 7 hours of sleep per night with the use of his CPAP.  There is no sleep study on file.  Paroxysmal A. fib: Stable on Metoprolol and Eliquis. He is concerned that his heart rate may be too low.  ECG from 12/2019 reviewed.  Past Medical History:  Diagnosis Date  . Arthritis   . Arthrofibrosis of total knee arthroplasty (HCC) 05/30/2015  . Bilateral pulmonary embolism (HCC)    a. 03/2015 CTA: acute bilat PE; b. IVC filter placed in 2017--> removed 2018.  Marland Kitchen Chest pain    a. 2015 Abnl MV;  b. 2015 Cath: nl cors.  . Chronic diastolic CHF (congestive heart failure) (HCC)    a. 03/2015 Echo: EF 55-65%, poor windows, mildly dil RV.  Marland Kitchen DVT (deep venous thrombosis) (HCC)    a. 03/2015 U/S: occlusive DVT w/in the distal aspect of the Left fem vein through the L popliteal vein.  Marland Kitchen Dyspnea    On exertion  . Dysrhythmia    afib   . GERD (gastroesophageal reflux disease)   . Headache   . Hypertension   . Hypertensive heart disease   . Obesity   . OSA (obstructive sleep apnea)    uses CPAP nightly settings at 15   . PAF (paroxysmal atrial fibrillation) (HCC)   . Pneumonia    hx of years ago   . Primary localized osteoarthritis of left knee    a.  11/2014 s/p L TKA.  . Primary localized osteoarthritis of right knee    a. 02/2014 s/p R Partial Knee arthroplasty.  . Stiffness of left knee    a. 12/2014 s/p manipulation under anesthesia.  . Urine incontinence     Current Outpatient Medications  Medication Sig Dispense Refill  . apixaban (ELIQUIS) 5 MG TABS tablet Take 5 mg by mouth 2 (two) times daily.     Marland Kitchen apixaban (ELIQUIS) 5 MG TABS tablet Take 5 mg by mouth 2 (two) times daily.    . baclofen (LIORESAL) 10 MG tablet TAKE 1 TO 2 TABLETS BY MOUTH 3 TIMES DAILY AS NEEDED FOR MUSCLE SPASMS (Patient taking differently: Take 10 mg by mouth daily as needed for muscle spasms.) 180 each 0  . baclofen (LIORESAL) 10 MG tablet Take 10 mg by mouth 3 (three) times daily as needed for muscle spasms.    . ciprofloxacin (CIPRO) 500 MG tablet Take 1 tablet (500 mg total) by mouth 2 (two) times daily. 6 tablet 0  . cyanocobalamin (,VITAMIN B-12,) 1000 MCG/ML injection Inject 1,000 mcg into the muscle every 30 (thirty) days.     Marland Kitchen enoxaparin (LOVENOX) 40 MG/0.4ML injection Inject 0.4 mLs (40 mg total) into the skin 2 (two) times daily for 14 days. (Patient  not taking: Reported on 12/29/2019) 11.2 mL 0  . flecainide (TAMBOCOR) 100 MG tablet TAKE 1 TABLET BY MOUTH TWICE (2) DAILY (Patient taking differently: Take 100 mg by mouth 2 (two) times daily.) 180 tablet 3  . flecainide (TAMBOCOR) 100 MG tablet Take 100 mg by mouth 2 (two) times daily.    Marland Kitchen Hyoscyamine Sulfate SL (LEVSIN/SL) 0.125 MG SUBL Place 0.125 mg under the tongue every 4 (four) hours as needed (bladder spasms, urgency, frequency). 1-2 TABS (Patient taking differently: Place 0.125 mg under the tongue every 4 (four) hours as needed (bladder spasms, urgency, frequency). ) 40 tablet 3  . Meth-Hyo-M Bl-Na Phos-Ph Sal (URIBEL) 118 MG CAPS Take 1 capsule (118 mg total) by mouth every 6 (six) hours as needed (dysuria, painful urination). (Patient taking differently: Take 1 capsule by mouth every 6 (six)  hours as needed (dysuria, painful urination). Uro-Mp) 40 capsule 3  . Meth-Hyo-M Bl-Na Phos-Ph Sal (URIBEL) 118 MG CAPS Take 1 capsule (118 mg total) by mouth every 6 (six) hours as needed (dysuria). 40 capsule 3  . Meth-Hyo-M Bl-Na Phos-Ph Sal (URO-MP) 118 MG CAPS Take 118 mg by mouth every 6 (six) hours as needed (pain of urination).    . metoprolol succinate (TOPROL-XL) 25 MG 24 hr tablet TAKE 1 TABLET BY MOUTH ONCE A DAY WITH OR IMMEDIATELY FOLLOWING A MEAL (Patient taking differently: Take 25 mg by mouth daily.) 90 tablet 3  . metoprolol succinate (TOPROL-XL) 25 MG 24 hr tablet Take 25 mg by mouth daily.    . Multiple Vitamins-Minerals (MULTIVITAMIN WITH MINERALS) tablet Take 1 tablet by mouth daily.    . Multiple Vitamins-Minerals (MULTIVITAMIN WITH MINERALS) tablet Take 1 tablet by mouth daily.    . ondansetron (ZOFRAN ODT) 8 MG disintegrating tablet Take 1 tablet (8 mg total) by mouth every 6 (six) hours as needed for nausea or vomiting. 10 tablet 3  . ondansetron (ZOFRAN-ODT) 4 MG disintegrating tablet Take 1 tablet (4 mg total) by mouth every 6 (six) hours as needed for nausea or vomiting. (Patient not taking: Reported on 12/29/2019) 20 tablet 0  . ondansetron (ZOFRAN-ODT) 4 MG disintegrating tablet Take 8 mg by mouth every 6 (six) hours as needed.    Marland Kitchen oxyCODONE-acetaminophen (PERCOCET) 10-325 MG tablet Take 1-2 tablets by mouth every 4 (four) hours as needed for pain (pain). Maximum dose per 24 hours - 8 pills 20 tablet 0  . pantoprazole (PROTONIX) 40 MG tablet Take 1 tablet (40 mg total) by mouth daily. 90 tablet 0  . pantoprazole (PROTONIX) 40 MG tablet Take 40 mg by mouth daily.    . predniSONE (DELTASONE) 10 MG tablet Take 10 mg by mouth daily with breakfast.    . Sennosides-Docusate Sodium (SM STOOL SOFTENER/LAXATIVE PO) Take 8.6 mg by mouth daily.    . tamsulosin (FLOMAX) 0.4 MG CAPS capsule Take 1 capsule (0.4 mg total) by mouth daily. 30 capsule 1  . tamsulosin (FLOMAX) 0.4 MG CAPS  capsule Take 0.4 mg by mouth at bedtime.     Current Facility-Administered Medications  Medication Dose Route Frequency Provider Last Rate Last Admin  . cyanocobalamin ((VITAMIN B-12)) injection 1,000 mcg  1,000 mcg Intramuscular Once Jearld Fenton, NP        Allergies  Allergen Reactions  . Penicillins Anaphylaxis and Other (See Comments)    From childhood; uncertain of reaction Has patient had a PCN reaction causing immediate rash, facial/tongue/throat swelling, SOB or lightheadedness with hypotension: Unk Has patient had a PCN reaction  causing severe rash involving mucus membranes or skin necrosis: Unk Has patient had a PCN reaction that required hospitalization: Unk Has patient had a PCN reaction occurring within the last 10 years: No If all of the above answers are "NO", then may proceed with Cephalosporin use.  . Vancomycin Other (See Comments)    Red Mans' syndrome  . Penicillins Other (See Comments)    Childhood  . Vancomycin Other (See Comments)    Red man Syndrome    Family History  Problem Relation Age of Onset  . Coronary artery disease Mother   . Hyperlipidemia Mother   . Hypertension Mother   . Arthritis Mother   . Coronary artery disease Father   . Heart disease Paternal Grandmother   . Alzheimer's disease Paternal Grandfather     Social History   Socioeconomic History  . Marital status: Unknown    Spouse name: Not on file  . Number of children: 2  . Years of education: 65  . Highest education level: Not on file  Occupational History  . Occupation: Energy manager: [employer]  Tobacco Use  . Smoking status: Never Smoker  . Smokeless tobacco: Never Used  Vaping Use  . Vaping Use: Never used  Substance and Sexual Activity  . Alcohol use: Not Currently    Comment: occassional  . Drug use: No  . Sexual activity: Yes  Other Topics Concern  . Not on file  Social History Narrative   ** Merged History Encounter **       Social Determinants of  Health   Financial Resource Strain: Not on file  Food Insecurity: Not on file  Transportation Needs: Not on file  Physical Activity: Not on file  Stress: Not on file  Social Connections: Not on file  Intimate Partner Violence: Not on file    Hospitiliaztions: 11/18, 12/5, 12/9-kidney stones.  10/15-gastric sleeve  Health Maintenance:    Flu: 09/2018  Tetanus: 02/2017  Covid: Moderna  PSA: 06/2018  Colon Screening: never  Eye Doctor: annually  Dental Exam: annually   Providers:   PCP: Webb Silversmith, NP  Cardiologist: Dr. Fletcher Anon  Urologist: Dr. Verl Blalock  Pulmonologist: Dr. Chancy Milroy   I have personally reviewed and have noted:  1. The patient's medical and social history 2. Their use of alcohol, tobacco or illicit drugs 3. Their current medications and supplements 4. The patient's functional ability including ADL's, fall risks, home safety risks and hearing or visual impairment. 5. Diet and physical activities 6. Evidence for depression or mood disorder  Subjective:   Review of Systems:   Constitutional: Pt reports intentional weight loss. Denies fever, malaise, fatigue, headache.  HEENT: Denies eye pain, eye redness, ear pain, ringing in the ears, wax buildup, runny nose, nasal congestion, bloody nose, or sore throat. Respiratory: Denies difficulty breathing, shortness of breath, cough or sputum production.   Cardiovascular: Denies chest pain, chest tightness, palpitations or swelling in the hands or feet.  Gastrointestinal: Denies abdominal pain, bloating, constipation, diarrhea or blood in the stool.  GU: Denies urgency, frequency, pain with urination, burning sensation, blood in urine, odor or discharge. Musculoskeletal: Pt reports intermittent joint pain. Denies decrease in range of motion, difficulty with gait, muscle pain or joint swelling.  Skin: Denies redness, rashes, lesions or ulcercations.  Neurological: Denies dizziness, difficulty with memory, difficulty with speech  or problems with balance and coordination.  Psych: Denies anxiety, depression, SI/HI.  No other specific complaints in a complete review of systems (except  as listed in HPI above).  Objective:  PE:   BP 124/60 (BP Location: Right Arm, Patient Position: Sitting, Cuff Size: Large)   Pulse (!) 47   Temp (!) 97.1 F (36.2 C) (Temporal)   Ht 5\' 9"  (1.753 m)   Wt 242 lb 12.8 oz (110.1 kg)   SpO2 97%   BMI 35.86 kg/m   Wt Readings from Last 3 Encounters:  12/20/19 253 lb 12.8 oz (115.1 kg)  12/09/19 261 lb (118.4 kg)  12/06/19 264 lb (119.7 kg)    General: Appears his stated age, well developed, well nourished in NAD. Skin: Warm, dry and intact. Abrasion noted to left side of face. HEENT: Head: normal shape and size; Eyes: sclera white, no icterus, conjunctiva pink, PERRLA and EOMs intact; Cardiovascular: Bradycardic with normal rhythm. S1,S2 noted.  No murmur, rubs or gallops noted. No JVD or BLE edema. No carotid bruits noted. Pulmonary/Chest: Normal effort and positive vesicular breath sounds. No respiratory distress. No wheezes, rales or ronchi noted.  Abdomen: Soft and nontender. Normal bowel sounds. No distention or masses noted. Liver, spleen and kidneys non palpable. Musculoskeletal: Strength 5/5 BUE/BLE. No signs of joint swelling.  Neurological: Alert and oriented. Cranial nerves II-XII grossly intact. Coordination normal.  Psychiatric: Mood and affect normal. Behavior is normal. Judgment and thought content normal.     BMET    Component Value Date/Time   NA 138 12/26/2019 0123   NA 141 10/31/2017 1438   K 3.7 12/26/2019 0123   CL 105 12/26/2019 0123   CO2 23 12/26/2019 0123   GLUCOSE 108 (H) 12/26/2019 0123   BUN 21 (H) 12/26/2019 0123   BUN 18 10/31/2017 1438   CREATININE 1.34 (H) 12/26/2019 0123   CREATININE 1.27 12/06/2015 1600   CALCIUM 9.3 12/26/2019 0123   GFRNONAA >60 12/26/2019 0123   GFRAA >60 10/19/2019 1337   GFRAA >60 10/19/2019 1337    Lipid  Panel     Component Value Date/Time   CHOL 164 06/23/2018 1545   CHOL 170 10/31/2017 1438   TRIG 91.0 06/23/2018 1545   HDL 40.20 06/23/2018 1545   HDL 36 (L) 10/31/2017 1438   CHOLHDL 4 06/23/2018 1545   VLDL 18.2 06/23/2018 1545   LDLCALC 106 (H) 06/23/2018 1545   LDLCALC 108 (H) 10/31/2017 1438    CBC    Component Value Date/Time   WBC 10.5 12/26/2019 0123   RBC 4.93 12/26/2019 0123   HGB 15.0 12/26/2019 0123   HGB 14.0 10/31/2017 1438   HCT 44.5 12/26/2019 0123   HCT 43.5 10/31/2017 1438   PLT 188 12/26/2019 0123   PLT 220 10/31/2017 1438   MCV 90.3 12/26/2019 0123   MCV 83 10/31/2017 1438   MCH 30.4 12/26/2019 0123   MCHC 33.7 12/26/2019 0123   RDW 13.4 12/26/2019 0123   RDW 14.4 10/31/2017 1438   LYMPHSABS 1.2 10/27/2019 1133   LYMPHSABS 1.6 10/31/2017 1438   MONOABS 1.0 10/27/2019 1133   EOSABS 0.0 10/27/2019 1133   EOSABS 0.2 10/31/2017 1438   BASOSABS 0.0 10/27/2019 1133   BASOSABS 0.0 10/31/2017 1438    Hgb A1C Lab Results  Component Value Date   HGBA1C 5.4 11/03/2019      Assessment and Plan:   Medicare Annual Wellness Visit:  Diet: He does eat lean meat. He is unable to eat fruits and veggies. He is not eating fried foods. He drinks mostly water. Physical activity: Working around the house Depression/mood screen: Negative, PHQ 9 0 score of  Hearing: Intact to whispered voice Visual acuity: Grossly normal, performs annual eye exam  ADLs: Capable Fall risk: None Home safety: Good Cognitive evaluation: Intact to orientation, naming, recall and repetition EOL planning: No adv directives, full code/ I agree  Preventative Medicine: Flu shot today. Tetanus UTD. Covid UTD. Cologuard ordered. Encouraged him to consume a balanced diet and exercise regimen. Advised him to see an eye doctor and dentist annually. Will check CBC, CMET, Lipid, A1C, PSA and Hep C today. Due dates for screening exam given to patient as part of his AVS.   Next appointment: 1  year, Medicare wellness exam Webb Silversmith, NP This visit occurred during the SARS-CoV-2 public health emergency.  Safety protocols were in place, including screening questions prior to the visit, additional usage of staff PPE, and extensive cleaning of exam room while observing appropriate contact time as indicated for disinfecting solutions.      Webb Silversmith, NP

## 2020-01-17 NOTE — Assessment & Plan Note (Signed)
Lifelong Eliquis CBC today 

## 2020-01-17 NOTE — Assessment & Plan Note (Signed)
Continue pantoprazole until advised to stop per bariatrics CBC and C met today

## 2020-01-17 NOTE — Assessment & Plan Note (Signed)
Lifelong Eliquis 

## 2020-01-20 NOTE — Progress Notes (Signed)
Dr. Kirke Corin recommends stopping beta blocker. Please d/c from list

## 2020-02-09 ENCOUNTER — Other Ambulatory Visit: Payer: Self-pay

## 2020-02-09 ENCOUNTER — Ambulatory Visit
Admission: RE | Admit: 2020-02-09 | Discharge: 2020-02-09 | Disposition: A | Discharge status: Home / Self Care | Payer: Medicare Other | Source: Ambulatory Visit | Attending: Cardiovascular Disease | Admitting: Cardiovascular Disease

## 2020-02-09 ENCOUNTER — Ambulatory Visit: Payer: Medicare Other | Admitting: Cardiovascular Disease

## 2020-02-09 ENCOUNTER — Telehealth: Payer: Self-pay | Admitting: Cardiovascular Disease

## 2020-02-09 ENCOUNTER — Encounter: Payer: Self-pay | Admitting: Internal Medicine

## 2020-02-09 ENCOUNTER — Encounter: Payer: Self-pay | Admitting: Cardiovascular Disease

## 2020-02-09 VITALS — BP 110/62 | HR 45 | Ht 69.0 in | Wt 236.0 lb

## 2020-02-09 DIAGNOSIS — R519 Headache, unspecified: Secondary | ICD-10-CM | POA: Insufficient documentation

## 2020-02-09 DIAGNOSIS — I480 Paroxysmal atrial fibrillation: Secondary | ICD-10-CM

## 2020-02-09 DIAGNOSIS — Z7901 Long term (current) use of anticoagulants: Secondary | ICD-10-CM | POA: Insufficient documentation

## 2020-02-09 DIAGNOSIS — W19XXXA Unspecified fall, initial encounter: Secondary | ICD-10-CM | POA: Diagnosis not present

## 2020-02-09 DIAGNOSIS — Z86711 Personal history of pulmonary embolism: Secondary | ICD-10-CM

## 2020-02-09 NOTE — Telephone Encounter (Signed)
Calling with stat CT results

## 2020-02-09 NOTE — Progress Notes (Signed)
Cardiology Office Note   Date:  02/09/2020   ID:  Richard ConnersMichael D Zellers, DOB 09/29/1960, MRN [id number]  PCP:  Lorre MunroeBaity, Regina W, NP  Cardiologist:   Lorine BearsMuhammad Jasier Calabretta, MD   Chief Complaint  Patient presents with  . Other    6 month follow up. Patient states he went out of the house this morning and fell - hit his head. Patient c/o HR dropping to low 40s. Meds reviewed verbally with patient.       History of Present Illness: Richard [name] is a 60 y.o. male who presents for a follow-up visit regarding paroxysmal atrial fibrillation and preop cardiovascular evaluation for shoulder surgery.. The patient has history of normal cardiac catheterization in 2015 after an abnormal stress test, hypertension, obesity, osteoarthritis status post bilateral knee surgeries.   The patient had multiple knee surgeries starting in 2016. He developed left lower extremity DVT complicated by massive pulmonary embolism which was treated with catheter-based intervention and IVC filter placement.  He had atrial fibrillation at that time but converted to sinus rhythm. He had intermittent palpitations thought to be due to atrial fibrillation and has been maintaining in sinus rhythm with flecainide. Lifelong anticoagulation has been recommended.  He had gastric sleeve surgery in October and has lost 80 pounds since then. He developed kidney stones in November that required treatment. He is feeling better now.  He had issues with bradycardia recently with heart rate in the high 40s with mild dizziness. He slid today on the ice this morning and felt on the back of his head and his back. He is having a headache now.  Past Medical History:  Diagnosis Date  . Arthritis   . Arthrofibrosis of total knee arthroplasty (HCC) 05/30/2015  . Bilateral pulmonary embolism (HCC)    a. 03/2015 CTA: acute bilat PE; b. IVC filter placed in 2017--> removed 2018.  Marland Kitchen. Chest pain    a. 2015 Abnl MV;  b. 2015 Cath: nl cors.  . Chronic  diastolic CHF (congestive heart failure) (HCC)    a. 03/2015 Echo: EF 55-65%, poor windows, mildly dil RV.  Marland Kitchen. DVT (deep venous thrombosis) (HCC)    a. 03/2015 U/S: occlusive DVT w/in the distal aspect of the Left fem vein through the L popliteal vein.  Marland Kitchen. Dyspnea    On exertion  . Dysrhythmia    afib   . GERD (gastroesophageal reflux disease)   . Headache   . Hypertension   . Hypertensive heart disease   . Obesity   . OSA (obstructive sleep apnea)    uses CPAP nightly settings at 15   . PAF (paroxysmal atrial fibrillation) (HCC)   . Pneumonia    hx of years ago   . Primary localized osteoarthritis of left knee    a. 11/2014 s/p L TKA.  . Primary localized osteoarthritis of right knee    a. 02/2014 s/p R Partial Knee arthroplasty.  . Stiffness of left knee    a. 12/2014 s/p manipulation under anesthesia.  . Urine incontinence     Past Surgical History:  Procedure Laterality Date  . APPENDECTOMY    . CARDIAC CATHETERIZATION    . CARPAL TUNNEL RELEASE Left 03/2018  . CYSTOSCOPY W/ RETROGRADES  12/09/2019   Procedure: CYSTOSCOPY WITH RETROGRADE PYELOGRAM;  Surgeon: Orson ApeWolff, Eron R, MD;  Location: [facility];  Service: Urology;;  . Bluford KaufmannYSTOSCOPY WITH STENT PLACEMENT Right 12/09/2019   Procedure: CYSTOSCOPY WITH STENT PLACEMENT;  Surgeon: Orson ApeWolff, Chaun R, MD;  Location:  [facility];  Service: Urology;  Laterality: Right;  . EXAM UNDER ANESTHESIA WITH MANIPULATION OF KNEE Left 05/30/2015   Procedure: EXAM UNDER ANESTHESIA WITH MANIPULATION OF LEFT KNEE;  Surgeon: Marchia Bond, MD;  Location: [place];  Service: Orthopedics;  Laterality: Left;  . HIATAL HERNIA REPAIR N/A 10/26/2019   Procedure: HIATAL HERNIA REPAIR;  Surgeon: Johnathan Hausen, MD;  Location: [facility];  Service: General;  Laterality: N/A;  . I & D KNEE WITH POLY EXCHANGE Left 10/30/2015   Procedure: IRRIGATION AND DEBRIDEMENT KNEE WITH POLY EXCHANGE PLACE SPACERS;  Surgeon: Frederik Pear, MD;  Location: [place];  Service: Orthopedics;   Laterality: Left;  OFF ELIQUIST 3 DAYS  . IVC FILTER PLACEMENT ([facility] HX)  04/13/15  . IVC FILTER REMOVAL N/A 08/13/2016   Procedure: IVC Filter Removal;  Surgeon: Katha Cabal, MD;  Location: [place] [facility];  Service: Cardiovascular;  Laterality: N/A;  . JOINT REPLACEMENT    . KNEE ARTHROSCOPY Left 08/08/2015   Procedure: LEFT KNEE MANIPULATION WITH ARTHROSCOPIC LYSIS OF ADHESIONS AND ASPIRATION;  Surgeon: Marchia Bond, MD;  Location: [place];  Service: Orthopedics;  Laterality: Left;  . KNEE CLOSED REDUCTION Left 01/20/2015   Procedure: LEFT KNEE MANIPULATION;  Surgeon: Marchia Bond, MD;  Location: [place];  Service: Orthopedics;  Laterality: Left;  . LAPAROSCOPIC GASTRIC SLEEVE RESECTION N/A 10/26/2019   Procedure: LAPAROSCOPIC SLEEVE GASTRECTOMY;  Surgeon: Johnathan Hausen, MD;  Location: [facility];  Service: General;  Laterality: N/A;  . LEFT HEART CATHETERIZATION WITH CORONARY ANGIOGRAM N/A 01/12/2014   Procedure: LEFT HEART CATHETERIZATION WITH CORONARY ANGIOGRAM;  Surgeon: Jettie Booze, MD;  Location: [facility];  Service: Cardiovascular;  Laterality: N/A;  . ORTHOPEDIC SURGERY Left    arthroscopy x3  . PARTIAL KNEE ARTHROPLASTY Right 02/25/2014   Procedure: RIGHT KNEE ARTHROPLASTY CONDYLE AND PLATEAU MEDIAL COMPARTMENT ;  Surgeon: Johnny Bridge, MD;  Location: [place];  Service: Orthopedics;  Laterality: Right;  . PERIPHERAL VASCULAR CATHETERIZATION N/A 03/29/2015   Procedure: IVC Filter Insertion, and possible thrombectomy;  Surgeon: Katha Cabal, MD;  Location: [place] [facility];  Service: Cardiovascular;  Laterality: N/A;  . ROTATOR CUFF REPAIR Left   . TOTAL KNEE ARTHROPLASTY  12/06/2014   Procedure: TOTAL KNEE ARTHROPLASTY;  Surgeon: Marchia Bond, MD;  Location: [place];  Service: Orthopedics;;  . TOTAL KNEE REVISION Left 02/08/2016   Procedure: TOTAL KNEE REVISION;  Surgeon: Frederik Pear, MD;  Location: [place];  Service:  Orthopedics;  Laterality: Left;  . UPPER GI ENDOSCOPY N/A 10/26/2019   Procedure: UPPER GI ENDOSCOPY;  Surgeon: Johnathan Hausen, MD;  Location: [facility];  Service: General;  Laterality: N/A;  . URETEROSCOPY WITH HOLMIUM LASER LITHOTRIPSY Right 12/30/2019   Procedure: URETEROSCOPY WITH HOLMIUM LASER LITHOTRIPSY;  Surgeon: Royston Cowper, MD;  Location: [facility];  Service: Urology;  Laterality: Right;     Current Outpatient Medications  Medication Sig Dispense Refill  . apixaban (ELIQUIS) 5 MG TABS tablet Take 5 mg by mouth 2 (two) times daily.    . baclofen (LIORESAL) 10 MG tablet TAKE 1 TO 2 TABLETS BY MOUTH 3 TIMES DAILY AS NEEDED FOR MUSCLE SPASMS 180 each 0  . cyanocobalamin (,VITAMIN B-12,) 1000 MCG/ML injection Inject 1,000 mcg into the muscle every 30 (thirty) days.     . flecainide (TAMBOCOR) 100 MG tablet TAKE 1 TABLET BY MOUTH TWICE (2) DAILY 180 tablet 3  . Hyoscyamine Sulfate SL (LEVSIN/SL) 0.125 MG SUBL Place  0.125 mg under the tongue every 4 (four) hours as needed (bladder spasms, urgency, frequency). 1-2 TABS 40 tablet 3  . Meth-Hyo-M Bl-Na Phos-Ph Sal (URIBEL) 118 MG CAPS Take 1 capsule (118 mg total) by mouth every 6 (six) hours as needed (dysuria, painful urination). 40 capsule 3  . metoprolol succinate (TOPROL-XL) 25 MG 24 hr tablet TAKE 1 TABLET BY MOUTH ONCE A DAY WITH OR IMMEDIATELY FOLLOWING A MEAL 90 tablet 3  . Multiple Vitamins-Minerals (MULTIVITAMIN WITH MINERALS) tablet Take 1 tablet by mouth daily.    . pantoprazole (PROTONIX) 40 MG tablet Take 1 tablet (40 mg total) by mouth daily. 90 tablet 0  . Sennosides-Docusate Sodium (SM STOOL SOFTENER/LAXATIVE PO) Take 8.6 mg by mouth daily.    . tamsulosin (FLOMAX) 0.4 MG CAPS capsule Take 1 capsule (0.4 mg total) by mouth daily. 30 capsule 1   No current facility-administered medications for this visit.    Allergies:   Penicillins, Vancomycin, Penicillins, and Vancomycin    Social History:  The patient  reports that he  has never smoked. He has never used smokeless tobacco. He reports previous alcohol use. He reports that he does not use drugs.   Family History:  The patient's family history includes Alzheimer's disease in his paternal grandfather; Arthritis in his mother; Coronary artery disease in his father and mother; Heart disease in his paternal grandmother; Hyperlipidemia in his mother; Hypertension in his mother.    ROS:  Please see the history of present illness.   Otherwise, review of systems are positive for none.   All other systems are reviewed and negative.    PHYSICAL EXAM: VS:  BP 110/62 (BP Location: Left Arm, Patient Position: Sitting, Cuff Size: Normal)   Pulse (!) 45   Ht 5\' 9"  (1.753 m)   Wt 236 lb (107 kg)   SpO2 97%   BMI 34.85 kg/m  , BMI Body mass index is 34.85 kg/m. GEN: Well nourished, well developed, in no acute distress  HEENT: normal  Neck: no JVD, carotid bruits, or masses Cardiac: RRR; no murmurs, rubs, or gallops,no edema  Respiratory:  clear to auscultation bilaterally, normal work of breathing GI: soft, nontender, nondistended, + BS MS: no deformity or atrophy  Skin: warm and dry, no rash Neuro:  Strength and sensation are intact Psych: euthymic mood, full affect   EKG:  EKG is ordered today. The ekg ordered today demonstrates sinus bradycardia with first-degree AV block and left axis deviation.   Recent Labs: 01/17/2020: ALT 19; BUN 16; Creatinine, Ser 1.30; Hemoglobin 15.1; Platelets 178.0; Potassium 4.3; Sodium 142    Lipid Panel    Component Value Date/Time   CHOL 162 01/17/2020 1214   CHOL 170 10/31/2017 1438   TRIG 112.0 01/17/2020 1214   HDL 39.80 01/17/2020 1214   HDL 36 (L) 10/31/2017 1438   CHOLHDL 4 01/17/2020 1214   VLDL 22.4 01/17/2020 1214   LDLCALC 100 (H) 01/17/2020 1214   LDLCALC 108 (H) 10/31/2017 1438      Wt Readings from Last 3 Encounters:  02/09/20 236 lb (107 kg)  01/17/20 242 lb 12.8 oz (110.1 kg)  12/20/19 253 lb 12.8  oz (115.1 kg)       ASSESSMENT AND PLAN:  1.  Paroxysmal atrial fibrillation:  No evidence of recurrent arrhythmia on flecainide. He is tolerating anticoagulation with Eliquis. Given bradycardia with mild dizziness, I discontinued Toprol today.  2. history of pulmonary embolism: The patient is on lifelong anticoagulation for this as well  as paroxysmal atrial fibrillation.  3. Obesity: Significant weight loss since having gastric sleeve surgery in October.  4. Status post a fall today: He reports hitting the back of his head and his back with headaches since then. Due to that and given that he is on Eliquis, we have to exclude intracranial bleed and thus I am going to obtain a stat CT head without contrast. Even if negative, I asked him to notify us if he continues to have headache   Disposition:   FU with me in 6 months  Signed,  Kathlyn Sacramento, MD  02/09/2020 9:16 AM    [place]

## 2020-02-09 NOTE — Patient Instructions (Signed)
Medication Instructions:  Your physician has recommended you make the following change in your medication:   STOP Metoprolol  *If you need a refill on your cardiac medications before your next appointment, please call your pharmacy*   Lab Work: None ordered If you have labs (blood work) drawn today and your tests are completely normal, you will receive your results only by: Marland Kitchen MyChart Message (if you have MyChart) OR . A paper copy in the mail If you have any lab test that is abnormal or we need to change your treatment, we will call you to review the results.   Testing/Procedures: Non-Cardiac CT of the head scanning, (CAT scanning), is a noninvasive, special x-ray that produces cross-sectional images of the body using x-rays and a computer. CT scans help physicians diagnose and treat medical conditions. For some CT exams, a contrast material is used to enhance visibility in the area of the body being studied. CT scans provide greater clarity and reveal more details than regular x-ray exams.     Follow-Up: At [facility], you and your health needs are our priority.  As part of our continuing mission to provide you with exceptional heart care, we have created designated Provider Care Teams.  These Care Teams include your primary Cardiologist (physician) and Advanced Practice Providers (APPs -  Physician Assistants and Nurse Practitioners) who all work together to provide you with the care you need, when you need it.  We recommend signing up for the patient portal called "MyChart".  Sign up information is provided on this After Visit Summary.  MyChart is used to connect with patients for Virtual Visits (Telemedicine).  Patients are able to view lab/test results, encounter notes, upcoming appointments, etc.  Non-urgent messages can be sent to your provider as well.   To learn more about what you can do with MyChart, go to [email or web address].    Your next appointment:   6  month(s)  The format for your next appointment:   In Person  Provider:   You may see Kathlyn Sacramento, MD or one of the following Advanced Practice Providers on your designated Care Team:    Murray Hodgkins, NP  Christell Faith, PA-C  Marrianne Mood, PA-C  Cadence Griffin, [place]  Laurann Montana, NP    Other Instructions N/A

## 2020-02-09 NOTE — Telephone Encounter (Signed)
Spoke w/Stephanie with CT. No acute findings on the patients CT of the head. Results are viewable in Epic. Update fwd to Dr. Fletcher Anon.

## 2020-02-09 NOTE — Telephone Encounter (Signed)
Pt left v/m requesting cb with colonoscopy results.

## 2020-02-10 LAB — COLOGUARD
COLOGUARD: NEGATIVE
Cologuard: NEGATIVE

## 2020-02-14 ENCOUNTER — Ambulatory Visit: Payer: PPO | Admitting: Skilled Nursing Facility1

## 2020-02-15 ENCOUNTER — Encounter: Payer: Self-pay | Admitting: Internal Medicine

## 2020-02-15 NOTE — Telephone Encounter (Signed)
Pt left v/m wanting to get covid results; appears pt was at [facility] on 02/13/20 ; I do not see results and appears from this pt message that Threasa Beards CMA has answered pts message. Sending note to [facility] CMA.

## 2020-02-17 ENCOUNTER — Ambulatory Visit: Payer: PPO | Admitting: Skilled Nursing Facility1

## 2020-02-17 ENCOUNTER — Telehealth: Payer: Self-pay | Admitting: *Deleted

## 2020-02-17 NOTE — Telephone Encounter (Signed)
Need to know when symptoms began and when he tested positive

## 2020-02-17 NOTE — Telephone Encounter (Signed)
Jaquari, Reckner to Me  Jan 25    10:12 AM Feeling pretty good I think I've had it for about 2 was been quarantined for 8 days

## 2020-02-17 NOTE — Telephone Encounter (Signed)
Pt left VM at Triage. He said covid center called him and said he would qualify for the infusion but he needs a referral sent from PCP before they could set it up, pt wanted to see if PCP would put order in so he can get the infusion

## 2020-02-18 NOTE — Telephone Encounter (Signed)
If he is feeling good, does he still want infusion?

## 2020-02-24 ENCOUNTER — Other Ambulatory Visit: Payer: Self-pay

## 2020-02-24 ENCOUNTER — Encounter: Payer: Medicare Other | Attending: Surgery | Admitting: Skilled Nursing Facility1

## 2020-02-24 DIAGNOSIS — E669 Obesity, unspecified: Secondary | ICD-10-CM | POA: Insufficient documentation

## 2020-02-24 DIAGNOSIS — Z9884 Bariatric surgery status: Secondary | ICD-10-CM | POA: Insufficient documentation

## 2020-02-24 DIAGNOSIS — I509 Heart failure, unspecified: Secondary | ICD-10-CM | POA: Insufficient documentation

## 2020-02-24 DIAGNOSIS — K219 Gastro-esophageal reflux disease without esophagitis: Secondary | ICD-10-CM | POA: Diagnosis not present

## 2020-02-24 DIAGNOSIS — Z713 Dietary counseling and surveillance: Secondary | ICD-10-CM | POA: Insufficient documentation

## 2020-02-24 DIAGNOSIS — I110 Hypertensive heart disease with heart failure: Secondary | ICD-10-CM | POA: Insufficient documentation

## 2020-02-24 NOTE — Progress Notes (Signed)
Bariatric Nutrition Follow-Up Visit Medical Nutrition Therapy   2 Months Post-Operative sleeve Surgery Surgery Date: 10/26/2019  NUTRITION ASSESSMENT  Anthropometrics  Start weight at [facility]: 302.4 lbs  Today's weight: 235 lbs  Body Composition Scale Date  Weight  lbs 253.8  Total Body Fat  % 33.1     Visceral Fat 26  Fat-Free Mass  % 66.8     Total Body Water  % 47.8     Muscle-Mass  lbs 47.8  BMI 37.2  Body Fat Displacement ---        Torso  lbs 52.1        Left Leg  lbs 10.4        Right Leg  lbs 10.4        Left Arm  lbs 5.2        Right Arm  lbs 5.2   Clinical  Medical hx: HTN, CHF. GERD, Renal Stone Medications: off HTN meds Labs:    Lifestyle & Dietary Hx  Pt states he had covid again and had to get some kidney stones removed. Pt states his father has cancer but is pretty stable. Pt state she got tired of Gatorade and water so he made watered down sweet tea. Pt states he has tried some fish but keeps burning it but states he will try it again. Pt states he tries not to eat after 7 pm/. Pt states he plans to get back tot the gym when he is al;l better but has been doing some mall walking. Pt wonders when he can drink juice dietitian advised to continue to avoid that and drink milk dietitian advised pt to limit himself to skim milk.pt states he plans to go the gym and do some weights and cardio.  Pt states he wants to be 220 pounds.   Pt states he is experiencing pain in his groin area: dietitian advised he speak with his physician.   Estimated daily fluid intake: 16.9 X 5-6 oz Estimated daily protein intake: 80+g Supplements:  Current average weekly physical activity: walk 1-2 miles on usual day  24-Hr Dietary Recall First Meal: 1 or 2 egg + 1 sausage or 2 eggs + grits Snack:  Second Meal: grilled chicken or grilled pork chop + salad usually only chicken or skipped Snack:  Third Meal: lean ham burger or fish or chicken noodle soup or chicken + green beans +  carrots Snack:  Beverages: water and geteroid and diluted sweet tea  Post-Op Goals/ Signs/ Symptoms Using straws: no Drinking while eating: no Chewing/swallowing difficulties: no Changes in vision: no Changes to mood/headaches: no Hair loss/changes to skin/nails: no Difficulty focusing/concentrating: no Sweating: no Dizziness/lightheadedness: no Palpitations: n0 Carbonated/caffeinated beverages: no N/V/D/C/Gas: no Abdominal pain: no Dumping syndrome: no    NUTRITION DIAGNOSIS  Overweight/obesity (-3.3) related to past poor dietary habits and physical inactivity as evidenced by completed bariatric surgery and following dietary guidelines for continued weight loss and healthy nutrition status.     NUTRITION INTERVENTION Nutrition counseling (C-1) and education (E-2) to facilitate bariatric surgery goals, including: . Diet advancement to the next phase (phase 3) now including Non starchy vegetables . The importance of consuming adequate calories as well as certain nutrients daily due to the body's need for essential vitamins, minerals, and fats . The importance of daily physical activity and to reach a goal of at least 150 minutes of moderate to vigorous physical activity weekly (or as directed by their physician) due to benefits such as increased musculature  and improved lab values . The importance of intuitive eating specifically learning hunger-satiety cues and understanding the importance of learning a new body: The importance of mindful eating to avoid grazing behaviors    Handouts Provided Include   Phase 4 handout  Learning Style & Readiness for Change Teaching method utilized: Visual & Auditory  Demonstrated degree of understanding via: Teach Back  Readiness Level: Action Barriers to learning/adherence to lifestyle change: none identified  RD's Notes for Next Visit . Assess adherence to pt chosen goals   MONITORING & EVALUATION Dietary intake, weekly physical  activity, body weight  Next Steps Patient is to follow-up in 2 months.

## 2020-02-25 ENCOUNTER — Encounter: Payer: Self-pay | Admitting: Internal Medicine

## 2020-02-28 ENCOUNTER — Telehealth: Payer: Self-pay

## 2020-02-28 NOTE — Telephone Encounter (Signed)
Results received from [employer]  Cologuard (DNA stool test) result:  NEGATIVE   Pt is aware via mychart

## 2020-03-27 ENCOUNTER — Other Ambulatory Visit: Payer: Self-pay | Admitting: Internal Medicine

## 2020-03-28 ENCOUNTER — Encounter: Payer: Self-pay | Admitting: Internal Medicine

## 2020-03-28 ENCOUNTER — Other Ambulatory Visit: Payer: Self-pay

## 2020-03-28 ENCOUNTER — Ambulatory Visit (INDEPENDENT_AMBULATORY_CARE_PROVIDER_SITE_OTHER): Payer: Medicare Other | Admitting: Internal Medicine

## 2020-03-28 VITALS — BP 110/70 | HR 52 | Temp 97.6°F | Wt 229.0 lb

## 2020-03-28 DIAGNOSIS — E538 Deficiency of other specified B group vitamins: Secondary | ICD-10-CM

## 2020-03-28 DIAGNOSIS — H6981 Other specified disorders of Eustachian tube, right ear: Secondary | ICD-10-CM

## 2020-03-28 MED ORDER — CYANOCOBALAMIN 1000 MCG/ML IJ SOLN
1000.0000 ug | Freq: Once | INTRAMUSCULAR | Status: AC
  Administered 2020-03-28: 1000 ug via INTRAMUSCULAR

## 2020-03-28 MED ORDER — TRAMADOL HCL 50 MG PO TABS
50.0000 mg | ORAL_TABLET | Freq: Every evening | ORAL | 0 refills | Status: DC | PRN
Start: 2020-03-28 — End: 2020-09-11

## 2020-03-28 NOTE — Patient Instructions (Signed)

## 2020-03-28 NOTE — Progress Notes (Signed)
Subjective:    Patient ID: Richard [name], male    DOB: 06/18/1960, 60 y.o.   MRN: [id number]  HPI  Pt presents to the clinic today with c/o right ear pain. This started but it is not currently causing him any pain today. He reports it feels like fullness. He reports some associated lighheadedness. He denies runny nose, nasal congestion, sore throat or cough. He has not noticed any drainage from his ear or loss of hearing. He has not tried anything OTC for this.  He would like a refill of Tramadol today.  He would also like to get his B12 shot today.  Review of Systems      Past Medical History:  Diagnosis Date  . Arthritis   . Arthrofibrosis of total knee arthroplasty ([place]) 05/30/2015  . Bilateral pulmonary embolism ([place])    a. 03/2015 CTA: acute bilat PE; b. IVC filter placed in 2017--> removed 2018.  Marland Kitchen Chest pain    a. 2015 Abnl MV;  b. 2015 Cath: nl cors.  . Chronic diastolic CHF (congestive heart failure) ([place])    a. 03/2015 Echo: EF 55-65%, poor windows, mildly dil RV.  Marland Kitchen DVT (deep venous thrombosis) ([place])    a. 03/2015 U/S: occlusive DVT w/in the distal aspect of the Left fem vein through the L popliteal vein.  Marland Kitchen Dyspnea    On exertion  . Dysrhythmia    afib   . GERD (gastroesophageal reflux disease)   . Headache   . Hypertension   . Hypertensive heart disease   . Obesity   . OSA (obstructive sleep apnea)    uses CPAP nightly settings at 15   . PAF (paroxysmal atrial fibrillation) ([place])   . Pneumonia    hx of years ago   . Primary localized osteoarthritis of left knee    a. 11/2014 s/p L TKA.  . Primary localized osteoarthritis of right knee    a. 02/2014 s/p R Partial Knee arthroplasty.  . Stiffness of left knee    a. 12/2014 s/p manipulation under anesthesia.  . Urine incontinence     Current Outpatient Medications  Medication Sig Dispense Refill  . apixaban (ELIQUIS) 5 MG TABS tablet Take 5 mg by mouth 2 (two) times daily.    . baclofen (LIORESAL) 10 MG  tablet TAKE 1 TO 2 TABLETS BY MOUTH 3 TIMES DAILY AS NEEDED FOR MUSCLE SPASMS 180 each 0  . cyanocobalamin (,VITAMIN B-12,) 1000 MCG/ML injection Inject 1,000 mcg into the muscle every 30 (thirty) days.     . flecainide (TAMBOCOR) 100 MG tablet TAKE 1 TABLET BY MOUTH TWICE (2) DAILY 180 tablet 3  . Hyoscyamine Sulfate SL (LEVSIN/SL) 0.125 MG SUBL Place 0.125 mg under the tongue every 4 (four) hours as needed (bladder spasms, urgency, frequency). 1-2 TABS 40 tablet 3  . Meth-Hyo-M Bl-Na Phos-Ph Sal (URIBEL) 118 MG CAPS Take 1 capsule (118 mg total) by mouth every 6 (six) hours as needed (dysuria, painful urination). 40 capsule 3  . Multiple Vitamins-Minerals (MULTIVITAMIN WITH MINERALS) tablet Take 1 tablet by mouth daily.    . pantoprazole (PROTONIX) 40 MG tablet Take 1 tablet (40 mg total) by mouth daily. 90 tablet 0  . Sennosides-Docusate Sodium (SM STOOL SOFTENER/LAXATIVE PO) Take 8.6 mg by mouth daily.    . tamsulosin (FLOMAX) 0.4 MG CAPS capsule Take 1 capsule (0.4 mg total) by mouth daily. 30 capsule 1   No current facility-administered medications for this visit.    Allergies  Allergen  Reactions  . Penicillins Anaphylaxis and Other (See Comments)    From childhood; uncertain of reaction Has patient had a PCN reaction causing immediate rash, facial/tongue/throat swelling, SOB or lightheadedness with hypotension: Unk Has patient had a PCN reaction causing severe rash involving mucus membranes or skin necrosis: Unk Has patient had a PCN reaction that required hospitalization: Unk Has patient had a PCN reaction occurring within the last 10 years: No If all of the above answers are "NO", then may proceed with Cephalosporin use.  . Vancomycin Other (See Comments)    Red Mans' syndrome  . Penicillins Other (See Comments)    Childhood  . Vancomycin Other (See Comments)    Red man Syndrome    Family History  Problem Relation Age of Onset  . Coronary artery disease Mother   .  Hyperlipidemia Mother   . Hypertension Mother   . Arthritis Mother   . Coronary artery disease Father   . Heart disease Paternal Grandmother   . Alzheimer's disease Paternal Grandfather     Social History   Socioeconomic History  . Marital status: Unknown    Spouse name: Not on file  . Number of children: 2  . Years of education: 28  . Highest education level: Not on file  Occupational History  . Occupation: Energy manager: [employer]  Tobacco Use  . Smoking status: Never Smoker  . Smokeless tobacco: Never Used  Vaping Use  . Vaping Use: Never used  Substance and Sexual Activity  . Alcohol use: Not Currently    Comment: occassional  . Drug use: No  . Sexual activity: Yes  Other Topics Concern  . Not on file  Social History Narrative   ** Merged History Encounter **       Social Determinants of Health   Financial Resource Strain: Not on file  Food Insecurity: Not on file  Transportation Needs: Not on file  Physical Activity: Not on file  Stress: Not on file  Social Connections: Not on file  Intimate Partner Violence: Not on file     Constitutional: Denies fever, malaise, fatigue, headache or abrupt weight changes.  HEENT: Pt reports right ear pain. Denies eye pain, eye redness, ringing in the ears, wax buildup, runny nose, nasal congestion, bloody nose, or sore throat. Respiratory: Denies difficulty breathing, shortness of breath, cough or sputum production.   Cardiovascular: Denies chest pain, chest tightness, palpitations or swelling in the hands or feet.   No other specific complaints in a complete review of systems (except as listed in HPI above).  Objective:   Physical Exam  BP 110/70   Pulse (!) 52   Temp 97.6 F (36.4 C) (Temporal)   Wt 229 lb (103.9 kg)   SpO2 98%   BMI 33.82 kg/m   Wt Readings from Last 3 Encounters:  02/24/20 235 lb 12.8 oz (107 kg)  02/09/20 236 lb (107 kg)  01/17/20 242 lb 12.8 oz (110.1 kg)    General: Appears his  stated age, obese, in NAD. HEENT: Head: normal shape and size; Eyes: sclera white, no icterus, conjunctiva pink, PERRLA and EOMs intact; Ears: Tm's gray and intact, normal light reflex; Cardiovascular: Normal rate and rhythm. S1,S2 noted.  No murmur, rubs or gallops noted.  Pulmonary/Chest: Normal effort and positive vesicular breath sounds. No respiratory distress. No wheezes, rales or ronchi noted.  Neurological: Alert and oriented.  Coordination normal.    BMET    Component Value Date/Time   NA 142  01/17/2020 1214   NA 141 10/31/2017 1438   K 4.3 01/17/2020 1214   CL 106 01/17/2020 1214   CO2 29 01/17/2020 1214   GLUCOSE 83 01/17/2020 1214   BUN 16 01/17/2020 1214   BUN 18 10/31/2017 1438   CREATININE 1.30 01/17/2020 1214   CREATININE 1.27 12/06/2015 1600   CALCIUM 9.5 01/17/2020 1214   GFRNONAA >60 12/26/2019 0123   GFRAA >60 10/19/2019 1337   GFRAA >60 10/19/2019 1337    Lipid Panel     Component Value Date/Time   CHOL 162 01/17/2020 1214   CHOL 170 10/31/2017 1438   TRIG 112.0 01/17/2020 1214   HDL 39.80 01/17/2020 1214   HDL 36 (L) 10/31/2017 1438   CHOLHDL 4 01/17/2020 1214   VLDL 22.4 01/17/2020 1214   LDLCALC 100 (H) 01/17/2020 1214   LDLCALC 108 (H) 10/31/2017 1438    CBC    Component Value Date/Time   WBC 7.5 01/17/2020 1214   RBC 4.92 01/17/2020 1214   HGB 15.1 01/17/2020 1214   HGB 14.0 10/31/2017 1438   HCT 44.8 01/17/2020 1214   HCT 43.5 10/31/2017 1438   PLT 178.0 01/17/2020 1214   PLT 220 10/31/2017 1438   MCV 90.9 01/17/2020 1214   MCV 83 10/31/2017 1438   MCH 30.4 12/26/2019 0123   MCHC 33.6 01/17/2020 1214   RDW 14.0 01/17/2020 1214   RDW 14.4 10/31/2017 1438   LYMPHSABS 1.2 10/27/2019 1133   LYMPHSABS 1.6 10/31/2017 1438   MONOABS 1.0 10/27/2019 1133   EOSABS 0.0 10/27/2019 1133   EOSABS 0.2 10/31/2017 1438   BASOSABS 0.0 10/27/2019 1133   BASOSABS 0.0 10/31/2017 1438    Hgb A1C Lab Results  Component Value Date   HGBA1C 4.9  01/17/2020           Assessment & Plan:  ETD Right:  Start Flonase OTC 1 spray right ear 1-2 times daily prn  B12 Deficiency:  B12 injection today  Return precautions discussed  Webb Silversmith, NP This visit occurred during the SARS-CoV-2 public health emergency.  Safety protocols were in place, including screening questions prior to the visit, additional usage of staff PPE, and extensive cleaning of exam room while observing appropriate contact time as indicated for disinfecting solutions.

## 2020-03-28 NOTE — Addendum Note (Signed)
Addended by: Randall An on: 03/28/2020 04:40 PM   Modules accepted: Orders

## 2020-04-18 ENCOUNTER — Encounter: Payer: Self-pay | Admitting: Internal Medicine

## 2020-04-18 ENCOUNTER — Ambulatory Visit (INDEPENDENT_AMBULATORY_CARE_PROVIDER_SITE_OTHER): Payer: Medicare Other | Admitting: Internal Medicine

## 2020-04-18 ENCOUNTER — Other Ambulatory Visit: Payer: Self-pay

## 2020-04-18 VITALS — BP 116/72 | HR 68 | Temp 97.6°F | Wt 228.0 lb

## 2020-04-18 DIAGNOSIS — H938X1 Other specified disorders of right ear: Secondary | ICD-10-CM

## 2020-04-18 DIAGNOSIS — R1032 Left lower quadrant pain: Secondary | ICD-10-CM | POA: Diagnosis not present

## 2020-04-18 DIAGNOSIS — E669 Obesity, unspecified: Secondary | ICD-10-CM | POA: Diagnosis not present

## 2020-04-18 DIAGNOSIS — R1031 Right lower quadrant pain: Secondary | ICD-10-CM | POA: Diagnosis not present

## 2020-04-18 NOTE — Progress Notes (Signed)
Subjective:    Patient ID: Richard [name], male    DOB: [birth year], 60 y.o.   MRN: [id number]  HPI  Pt presents to the clinic today for follow up right ear fullness. He reports this has been going on for about a month. He feels like there is water in his ear. He denies ear pain, ringing in the ears, hearing loss or drainage from the ear. He has tried Flonase OTC with minimal relief of symptoms.  He also c/o bilateral groin pain. This started 1 year ago. It is intermittent but seems more consistent and worse in the last 2-3 weeks. He describes the pain and sore, intermittent sharp when he is trying to get out of bed. He has chronic bilateral knee pain s/p b/l replacements. He had an xray of bilateral hips 04/2019 which was normal. He has taken Tramadol and Baclofen as prescribed with minimal relief of symptoms.    Review of Systems      Past Medical History:  Diagnosis Date  . Arthritis   . Arthrofibrosis of total knee arthroplasty ([place]) 05/30/2015  . Bilateral pulmonary embolism ([place])    a. 03/2015 CTA: acute bilat PE; b. IVC filter placed in 2017--> removed 2018.  Marland Kitchen Chest pain    a. 2015 Abnl MV;  b. 2015 Cath: nl cors.  . Chronic diastolic CHF (congestive heart failure) ([place])    a. 03/2015 Echo: EF 55-65%, poor windows, mildly dil RV.  Marland Kitchen DVT (deep venous thrombosis) ([place])    a. 03/2015 U/S: occlusive DVT w/in the distal aspect of the Left fem vein through the L popliteal vein.  Marland Kitchen Dyspnea    On exertion  . Dysrhythmia    afib   . GERD (gastroesophageal reflux disease)   . Headache   . Hypertension   . Hypertensive heart disease   . Obesity   . OSA (obstructive sleep apnea)    uses CPAP nightly settings at 15   . PAF (paroxysmal atrial fibrillation) ([place])   . Pneumonia    hx of years ago   . Primary localized osteoarthritis of left knee    a. 11/2014 s/p L TKA.  . Primary localized osteoarthritis of right knee    a. 02/2014 s/p R Partial Knee arthroplasty.  . Stiffness of  left knee    a. 12/2014 s/p manipulation under anesthesia.  . Urine incontinence     Current Outpatient Medications  Medication Sig Dispense Refill  . apixaban (ELIQUIS) 5 MG TABS tablet Take 5 mg by mouth 2 (two) times daily.    . baclofen (LIORESAL) 10 MG tablet TAKE 1 TO 2 TABLETS BY MOUTH 3 TIMES DAILY AS NEEDED FOR MUSCLE SPASMS 180 each 0  . cyanocobalamin (,VITAMIN B-12,) 1000 MCG/ML injection Inject 1,000 mcg into the muscle every 30 (thirty) days.     . flecainide (TAMBOCOR) 100 MG tablet TAKE 1 TABLET BY MOUTH TWICE (2) DAILY 180 tablet 3  . Hyoscyamine Sulfate SL (LEVSIN/SL) 0.125 MG SUBL Place 0.125 mg under the tongue every 4 (four) hours as needed (bladder spasms, urgency, frequency). 1-2 TABS 40 tablet 3  . Meth-Hyo-M Bl-Na Phos-Ph Sal (URIBEL) 118 MG CAPS Take 1 capsule (118 mg total) by mouth every 6 (six) hours as needed (dysuria, painful urination). 40 capsule 3  . Multiple Vitamins-Minerals (MULTIVITAMIN WITH MINERALS) tablet Take 1 tablet by mouth daily.    . pantoprazole (PROTONIX) 40 MG tablet Take 1 tablet (40 mg total) by mouth daily. 90 tablet 0  .  Sennosides-Docusate Sodium (SM STOOL SOFTENER/LAXATIVE PO) Take 8.6 mg by mouth daily.    . tamsulosin (FLOMAX) 0.4 MG CAPS capsule Take 1 capsule (0.4 mg total) by mouth daily. 30 capsule 1  . traMADol (ULTRAM) 50 MG tablet Take 1-2 tablets (50-100 mg total) by mouth at bedtime as needed. 30 tablet 0   No current facility-administered medications for this visit.    Allergies  Allergen Reactions  . Penicillins Anaphylaxis and Other (See Comments)    From childhood; uncertain of reaction Has patient had a PCN reaction causing immediate rash, facial/tongue/throat swelling, SOB or lightheadedness with hypotension: Unk Has patient had a PCN reaction causing severe rash involving mucus membranes or skin necrosis: Unk Has patient had a PCN reaction that required hospitalization: Unk Has patient had a PCN reaction occurring  within the last 10 years: No If all of the above answers are "NO", then may proceed with Cephalosporin use.  . Vancomycin Other (See Comments)    Red Mans' syndrome  . Penicillins Other (See Comments)    Childhood  . Vancomycin Other (See Comments)    Red man Syndrome    Family History  Problem Relation Age of Onset  . Coronary artery disease Mother   . Hyperlipidemia Mother   . Hypertension Mother   . Arthritis Mother   . Coronary artery disease Father   . Heart disease Paternal Grandmother   . Alzheimer's disease Paternal Grandfather     Social History   Socioeconomic History  . Marital status: Unknown    Spouse name: Not on file  . Number of children: 2  . Years of education: 55  . Highest education level: Not on file  Occupational History  . Occupation: Energy manager: [employer]  Tobacco Use  . Smoking status: Never Smoker  . Smokeless tobacco: Never Used  Vaping Use  . Vaping Use: Never used  Substance and Sexual Activity  . Alcohol use: Not Currently    Comment: occassional  . Drug use: No  . Sexual activity: Yes  Other Topics Concern  . Not on file  Social History Narrative   ** Merged History Encounter **       Social Determinants of Health   Financial Resource Strain: Not on file  Food Insecurity: Not on file  Transportation Needs: Not on file  Physical Activity: Not on file  Stress: Not on file  Social Connections: Not on file  Intimate Partner Violence: Not on file     Constitutional: Denies fever, malaise, fatigue, headache or abrupt weight changes.  HEENT: Pt reports right ear fullness. Denies eye pain, eye redness, ringing in the ears, wax buildup, runny nose, nasal congestion, bloody nose, or sore throat. Respiratory: Denies difficulty breathing, shortness of breath, cough or sputum production.   Cardiovascular: Denies chest pain, chest tightness, palpitations or swelling in the hands or feet.  Gastrointestinal: Denies abdominal pain,  bloating, constipation, diarrhea or blood in the stool.  GU: Denies urgency, frequency, pain with urination, burning sensation, blood in urine, odor or discharge. Musculoskeletal: Pt reports bilateral groin pain. Denies decrease in range of motion, difficulty with gait, or joint pain and swelling.  Skin: Denies redness, rashes, lesions or ulcercations.   No other specific complaints in a complete review of systems (except as listed in HPI above).  Objective:   Physical Exam  BP 116/72   Pulse 68   Temp 97.6 F (36.4 C) (Temporal)   Wt 228 lb (103.4 kg)  SpO2 98%   BMI 33.67 kg/m   Wt Readings from Last 3 Encounters:  03/28/20 229 lb (103.9 kg)  02/24/20 235 lb 12.8 oz (107 kg)  02/09/20 236 lb (107 kg)    General: Appears his stated age, obese, in NAD. Skin: Warm, dry and intact. No rashes noted. HEENT: Head: normal shape and size; Eyes: sclera white, EOMs intact; Ears: Tm's gray and intact, normal light reflex;  Cardiovascular: Normal rate. Pulmonary/Chest: Normal effort. Musculoskeletal: Normal flexion, extension, rotation and lateral bending of the spine. Normal abduction, adduction, internal and external rotation of the hips. Pain with palpation of bilateral groins. Strength 5/5 RLE, 4/5 LLE. Gait steady without device. Neurological: Alert and oriented.    BMET    Component Value Date/Time   NA 142 01/17/2020 1214   NA 141 10/31/2017 1438   K 4.3 01/17/2020 1214   CL 106 01/17/2020 1214   CO2 29 01/17/2020 1214   GLUCOSE 83 01/17/2020 1214   BUN 16 01/17/2020 1214   BUN 18 10/31/2017 1438   CREATININE 1.30 01/17/2020 1214   CREATININE 1.27 12/06/2015 1600   CALCIUM 9.5 01/17/2020 1214   GFRNONAA >60 12/26/2019 0123   GFRAA >60 10/19/2019 1337   GFRAA >60 10/19/2019 1337    Lipid Panel     Component Value Date/Time   CHOL 162 01/17/2020 1214   CHOL 170 10/31/2017 1438   TRIG 112.0 01/17/2020 1214   HDL 39.80 01/17/2020 1214   HDL 36 (L) 10/31/2017 1438    CHOLHDL 4 01/17/2020 1214   VLDL 22.4 01/17/2020 1214   LDLCALC 100 (H) 01/17/2020 1214   LDLCALC 108 (H) 10/31/2017 1438    CBC    Component Value Date/Time   WBC 7.5 01/17/2020 1214   RBC 4.92 01/17/2020 1214   HGB 15.1 01/17/2020 1214   HGB 14.0 10/31/2017 1438   HCT 44.8 01/17/2020 1214   HCT 43.5 10/31/2017 1438   PLT 178.0 01/17/2020 1214   PLT 220 10/31/2017 1438   MCV 90.9 01/17/2020 1214   MCV 83 10/31/2017 1438   MCH 30.4 12/26/2019 0123   MCHC 33.6 01/17/2020 1214   RDW 14.0 01/17/2020 1214   RDW 14.4 10/31/2017 1438   LYMPHSABS 1.2 10/27/2019 1133   LYMPHSABS 1.6 10/31/2017 1438   MONOABS 1.0 10/27/2019 1133   EOSABS 0.0 10/27/2019 1133   EOSABS 0.2 10/31/2017 1438   BASOSABS 0.0 10/27/2019 1133   BASOSABS 0.0 10/31/2017 1438    Hgb A1C Lab Results  Component Value Date   HGBA1C 4.9 01/17/2020            Assessment & Plan:   Right Ear Fullness:  Failed Flonase He has Furosemide 40 mg at home. Advised him to cut these in half and take 20 mg daily x 5 days If no improvement, consider referral to ENT  Bilateral Groin Pain:  I do not think this is back/hip related Likely muscle vs ligamentous strain Referral to PT for further evaluation and treatment Continue Tramadol and Baclofen as needed  Return precautions discussed Webb Silversmith, NP This visit occurred during the SARS-CoV-2 public health emergency.  Safety protocols were in place, including screening questions prior to the visit, additional usage of staff PPE, and extensive cleaning of exam room while observing appropriate contact time as indicated for disinfecting solutions.

## 2020-04-18 NOTE — Patient Instructions (Signed)
Earache, Adult An earache, or ear pain, can be caused by many things, including:  An infection.  Ear wax buildup.  Ear pressure.  Something in the ear that should not be there (foreign body).  A sore throat.  Tooth problems.  Jaw problems. Treatment of the earache will depend on the cause. If the cause is not clear or cannot be determined, you may need to watch your symptoms until your earache goes away or until a cause is found. Follow these instructions at home: Medicines  Take or apply over-the-counter and prescription medicines only as told by your health care provider.  If you were prescribed an antibiotic medicine, use it as told by your health care provider. Do not stop using the antibiotic even if you start to feel better.  Do not put anything in your ear other than medicine that is prescribed by your health care provider. Managing pain If directed, apply heat to the affected area as often as told by your health care provider. Use the heat source that your health care provider recommends, such as a moist heat pack or a heating pad.  Place a towel between your skin and the heat source.  Leave the heat on for 20-30 minutes.  Remove the heat if your skin turns bright red. This is especially important if you are unable to feel pain, heat, or cold. You may have a greater risk of getting burned. If directed, put ice on the affected area as often as told by your health care provider. To do this:  Put ice in a plastic bag.  Place a towel between your skin and the bag.  Leave the ice on for 20 minutes, 2-3 times a day.      General instructions  Pay attention to any changes in your symptoms.  Try resting in an upright position instead of lying down. This may help to reduce pressure in your ear and relieve pain.  Chew gum if it helps to relieve your ear pain.  Treat any allergies as told by your health care provider.  Drink enough fluid to keep your urine pale  yellow.  It is up to you to get the results of any tests that were done. Ask your health care provider, or the department that is doing the tests, when your results will be ready.  Keep all follow-up visits as told by your health care provider. This is important. Contact a health care provider if:  Your pain does not improve within 2 days.  Your earache gets worse.  You have new symptoms.  You have a fever. Get help right away if you:  Have a severe headache.  Have a stiff neck.  Have trouble swallowing.  Have redness or swelling behind your ear.  Have fluid or blood coming from your ear.  Have hearing loss.  Feel dizzy. Summary  An earache, or ear pain, can be caused by many things.  Treatment of the earache will depend on the cause. Follow recommendations from your health care provider to treat your ear pain.  If the cause is not clear or cannot be determined, you may need to watch your symptoms until your earache goes away or until a cause is found.  Keep all follow-up visits as told by your health care provider. This is important. This information is not intended to replace advice given to you by your health care provider. Make sure you discuss any questions you have with your health care provider. Document Revised:  08/15/2018 Document Reviewed: 08/15/2018 Elsevier Patient Education  [place].

## 2020-04-19 MED ORDER — FUROSEMIDE 20 MG PO TABS: 20.0000 mg | ORAL_TABLET | Freq: Every day | ORAL | 0 refills | Status: DC

## 2020-04-21 DIAGNOSIS — G4733 Obstructive sleep apnea (adult) (pediatric): Secondary | ICD-10-CM | POA: Diagnosis not present

## 2020-04-25 ENCOUNTER — Other Ambulatory Visit: Payer: Self-pay

## 2020-04-25 ENCOUNTER — Ambulatory Visit: Payer: Medicare Other | Attending: Internal Medicine

## 2020-04-25 DIAGNOSIS — M545 Low back pain, unspecified: Secondary | ICD-10-CM | POA: Diagnosis not present

## 2020-04-25 DIAGNOSIS — R2689 Other abnormalities of gait and mobility: Secondary | ICD-10-CM | POA: Insufficient documentation

## 2020-04-25 DIAGNOSIS — R1032 Left lower quadrant pain: Secondary | ICD-10-CM | POA: Diagnosis not present

## 2020-04-25 DIAGNOSIS — R1031 Right lower quadrant pain: Secondary | ICD-10-CM | POA: Insufficient documentation

## 2020-04-25 NOTE — Patient Instructions (Signed)
Seated hip extension isometrics   Sitting on a chair,    Squeeze your rear end muscles together and press your right foot onto the floor.     Hold for 5 seconds    Repeat 10 times   Perform 3 sets daily.      This is a corrective exercise. Once you no longer have symptoms, you can stop.

## 2020-04-25 NOTE — Therapy (Signed)
[place] [facility] [address] [place], [address] Phone: [phone]   Fax:  [phone]  Physical Therapy Evaluation  Patient Details  Name: BHARAT ANTILLON MRN: [id number] Date of Birth: 01/26/1960 Referring Provider (PT): Jearld Fenton, NP   Encounter Date: 04/25/2020   PT End of Session - 04/25/20 1102    Visit Number 1    Number of Visits 17    Date for PT Re-Evaluation 06/22/20    PT Start Time 8786    PT Stop Time 1151    PT Time Calculation (min) 49 min    Activity Tolerance Patient tolerated treatment well    Behavior During Therapy [facility] for tasks assessed/performed           Past Medical History:  Diagnosis Date  . Arthritis   . Arthrofibrosis of total knee arthroplasty ([place]) 05/30/2015  . Bilateral pulmonary embolism ([place])    a. 03/2015 CTA: acute bilat PE; b. IVC filter placed in 2017--> removed 2018.  Marland Kitchen Chest pain    a. 2015 Abnl MV;  b. 2015 Cath: nl cors.  . Chronic diastolic CHF (congestive heart failure) ([place])    a. 03/2015 Echo: EF 55-65%, poor windows, mildly dil RV.  Marland Kitchen DVT (deep venous thrombosis) ([place])    a. 03/2015 U/S: occlusive DVT w/in the distal aspect of the Left fem vein through the L popliteal vein.  Marland Kitchen Dyspnea    On exertion  . Dysrhythmia    afib   . GERD (gastroesophageal reflux disease)   . Headache   . Hypertension   . Hypertensive heart disease   . Obesity   . OSA (obstructive sleep apnea)    uses CPAP nightly settings at 15   . PAF (paroxysmal atrial fibrillation) ([place])   . Pneumonia    hx of years ago   . Primary localized osteoarthritis of left knee    a. 11/2014 s/p L TKA.  . Primary localized osteoarthritis of right knee    a. 02/2014 s/p R Partial Knee arthroplasty.  . Stiffness of left knee    a. 12/2014 s/p manipulation under anesthesia.  . Urine incontinence     Past Surgical History:  Procedure Laterality Date  . APPENDECTOMY    . CARDIAC  CATHETERIZATION    . CARPAL TUNNEL RELEASE Left 03/2018  . CYSTOSCOPY W/ RETROGRADES  12/09/2019   Procedure: CYSTOSCOPY WITH RETROGRADE PYELOGRAM;  Surgeon: Royston Cowper, MD;  Location: [facility];  Service: Urology;;  . Consuela Mimes WITH STENT PLACEMENT Right 12/09/2019   Procedure: CYSTOSCOPY WITH STENT PLACEMENT;  Surgeon: Royston Cowper, MD;  Location: [facility];  Service: Urology;  Laterality: Right;  . EXAM UNDER ANESTHESIA WITH MANIPULATION OF KNEE Left 05/30/2015   Procedure: EXAM UNDER ANESTHESIA WITH MANIPULATION OF LEFT KNEE;  Surgeon: Marchia Bond, MD;  Location: [place];  Service: Orthopedics;  Laterality: Left;  . HIATAL HERNIA REPAIR N/A 10/26/2019   Procedure: HIATAL HERNIA REPAIR;  Surgeon: Johnathan Hausen, MD;  Location: [facility];  Service: General;  Laterality: N/A;  . I & D KNEE WITH POLY EXCHANGE Left 10/30/2015   Procedure: IRRIGATION AND DEBRIDEMENT KNEE WITH POLY EXCHANGE PLACE SPACERS;  Surgeon: Frederik Pear, MD;  Location: [place];  Service: Orthopedics;  Laterality: Left;  OFF ELIQUIST 3 DAYS  . IVC FILTER PLACEMENT ([facility] HX)  04/13/15  . IVC FILTER REMOVAL N/A 08/13/2016   Procedure: IVC Filter Removal;  Surgeon: Katha Cabal, MD;  Location: [place] [facility];  Service: Cardiovascular;  Laterality: N/A;  . JOINT REPLACEMENT    . KNEE ARTHROSCOPY Left 08/08/2015   Procedure: LEFT KNEE MANIPULATION WITH ARTHROSCOPIC LYSIS OF ADHESIONS AND ASPIRATION;  Surgeon: Marchia Bond, MD;  Location: [place];  Service: Orthopedics;  Laterality: Left;  . KNEE CLOSED REDUCTION Left 01/20/2015   Procedure: LEFT KNEE MANIPULATION;  Surgeon: Marchia Bond, MD;  Location: [place];  Service: Orthopedics;  Laterality: Left;  . LAPAROSCOPIC GASTRIC SLEEVE RESECTION N/A 10/26/2019   Procedure: LAPAROSCOPIC SLEEVE GASTRECTOMY;  Surgeon: Johnathan Hausen, MD;  Location: [facility];  Service: General;  Laterality: N/A;  . LEFT HEART CATHETERIZATION WITH CORONARY ANGIOGRAM N/A  01/12/2014   Procedure: LEFT HEART CATHETERIZATION WITH CORONARY ANGIOGRAM;  Surgeon: Jettie Booze, MD;  Location: [facility];  Service: Cardiovascular;  Laterality: N/A;  . ORTHOPEDIC SURGERY Left    arthroscopy x3  . PARTIAL KNEE ARTHROPLASTY Right 02/25/2014   Procedure: RIGHT KNEE ARTHROPLASTY CONDYLE AND PLATEAU MEDIAL COMPARTMENT ;  Surgeon: Johnny Bridge, MD;  Location: [place];  Service: Orthopedics;  Laterality: Right;  . PERIPHERAL VASCULAR CATHETERIZATION N/A 03/29/2015   Procedure: IVC Filter Insertion, and possible thrombectomy;  Surgeon: Katha Cabal, MD;  Location: [place] [facility];  Service: Cardiovascular;  Laterality: N/A;  . ROTATOR CUFF REPAIR Left   . TOTAL KNEE ARTHROPLASTY  12/06/2014   Procedure: TOTAL KNEE ARTHROPLASTY;  Surgeon: Marchia Bond, MD;  Location: [place];  Service: Orthopedics;;  . TOTAL KNEE REVISION Left 02/08/2016   Procedure: TOTAL KNEE REVISION;  Surgeon: Frederik Pear, MD;  Location: [place];  Service: Orthopedics;  Laterality: Left;  . UPPER GI ENDOSCOPY N/A 10/26/2019   Procedure: UPPER GI ENDOSCOPY;  Surgeon: Johnathan Hausen, MD;  Location[facility];  Service: General;  Laterality: N/A;  . URETEROSCOPY WITH HOLMIUM LASER LITHOTRIPSY Right 12/30/2019   Procedure: URETEROSCOPY WITH HOLMIUM LASER LITHOTRIPSY;  Surgeon: Royston Cowper, MD;  Location: [facility];  Service: Urology;  Laterality: Right;    There were no vitals filed for this visit.    Subjective Assessment - 04/25/20 1106    Subjective B groin pain: 5/10 currently, 9/10 at most for the past month. Pt feel like the pain is in the hip joints.    Pertinent History B groin pain. Pt fell in his yard about a year ago which started his B groin pain. Pt landed on his tail bone. Pain has not chaged overall. However, about 1-2x a month the pain hurts so bad that he could not lift his legs up for 2-3 days. Has not had any MRI or CT for his back. Denies loss of bowel or  bladder control. No tingling or numbness from the waist down, just a sharp pain under his belly button and groin area. No LE weakness that he knows of. Back has been hurting him too. Chiropractor thinks the groin pain are from the arthritis in his hips.   L knee stiffness/TKA   Patient Stated Goals Be better able to move his legs to get out of the bed.    Currently in Pain? Yes    Pain Score 5     Pain Location Groin    Pain Orientation Left;Right    Pain Descriptors / Indicators Sharp;Burning;Stabbing    Pain Type Chronic pain    Pain Radiating Towards Localized at the anterior pelvis and B hip/groin area.    Pain Onset More than a month ago  Pain Frequency Occasional    Aggravating Factors  prolonged positions such as in: supine, sitting, R and L S/L (standing does not bother groin)    Pain Relieving Factors Movement              [facility] PT Assessment - 04/25/20 1121      Assessment   Medical Diagnosis R10.31,R10.32 (ICD-10-CM) - Bilateral groin pain    Referring Provider (PT) Jearld Fenton, NP    Onset Date/Surgical Date 04/18/20      Posture/Postural Control   Posture Comments B protracted shoulders, R lateral shift low back, R lumbar convexity slight R thoracolumbar rotation.      AROM   Lumbar Flexion WFL with lumbar burning sensation    Lumbar Extension WFL with lumbar burning sensation    Lumbar - Right Side Bend limited with L low back pulling sensation    Lumbar - Left Side Bend limited wiht greater L low back pulling sensation    Lumbar - Right Rotation WFL    Lumbar - Left Rotation WFL      PROM   Right Hip Internal Rotation  21    Left Hip Internal Rotation  16      Strength   Right Hip Flexion 4/5    Right Hip Extension 4-/5    Right Hip ABduction 4-/5   with tailbone pain, eases with rest   Left Hip Flexion 4/5    Left Hip Extension 4-/5    Left Hip ABduction 4-/5   with tail bone pain > R hip abduction MMT   Right Knee Flexion 4/5    Right Knee  Extension 5/5    Left Knee Flexion 4/5   L knee joint pain from replacement   Left Knee Extension 5/5      Palpation   Palpation comment TTP L L4 TP      Special Tests   Other special tests (+) long sit test suggesting anterior nutation of R innominate.      Ambulation/Gait   Gait Comments antalic, decreased stance R LE                      Objective measurements completed on examination: See above findings.   Blood pressure is controlled per pt.   Gait: antalgic, decreased stance R LE  Sitting: B groin pain  Standing: no pain  Supine hip flexion, IR and adduction  L: tailbone pain  R : tailbone pain  Therapeutic Exercise Sitting with lumbar towel roll: uncomfortable at the tail bone area. No change in B groin pain.   Seated hip extension isometrics   R 10x5 seconds. Decreased R lumbar lateral shift posture. Posture correction demonstrated to pt.   Reviewed and given as part of his HEP. Pt demonstrated and verbalized understanding. Handout provided.    Improved exercise technique, movement at target joints, use of target muscles after mod verbal, visual, tactile cues.    Response to treatment No pain after standing up and after session  Clinical impression Pt is a 60 year old male who came to physical therapy secondary to B groin pain. He also presents with altered posture, B hip weakness, bilateral hip IR limitations, and difficulty tolerating prolonged positions such as supine, S/L, and sitting secondary to B groin pain. Pt will benefit from skilled physical therapy services to address the aforementioned deficits.              PT Education - 04/25/20 1320  Education Details Ther-ex, HEP, POC    Person(s) Educated Patient    Methods Explanation;Demonstration;Tactile cues;Verbal cues;Handout    Comprehension Verbalized understanding;Returned demonstration            PT Short Term Goals - 04/25/20 1341      PT SHORT TERM GOAL #1    Title Pt will be independent with his initial HEP to decrease pain, improve strength and ability to tolerate static positions such as supine and sitting more comfortably.    Baseline Pt has started his HEP (04/25/2020)    Time 3    Period Weeks    Status New    Target Date 05/18/20             PT Long Term Goals - 04/25/20 1345      PT LONG TERM GOAL #1   Title Patient will have a decrease in B groin pain to 3/10 or less at worst to promote ability to tolerated sittng and supine and S/L static postitions more comfortably.    Baseline 9/10 B groin pain at most (04/25/2020)    Time 8    Period Weeks    Status New    Target Date 06/22/20      PT LONG TERM GOAL #2   Title Pt will improve bilateral hip extension and abduction strength by at least 1/2 MMT grade to promote ability to perform functional tasks more comfortably.    Baseline hip extension 4-/5 R, and L; hip abduction 4-/5 R and L (04/25/2020)    Time 8    Period Weeks    Status New    Target Date 06/22/20      PT LONG TERM GOAL #3   Title Pt will improve bilateral hip IR by at least 10 degrees to promote mobility as well as ability to tolerate static positions more comfortably.    Baseline hip IR 21 degrees R, 16 degrees L (04/25/2020)    Time 8    Period Weeks    Status New    Target Date 06/22/20      PT LONG TERM GOAL #4   Title Pt will improve his pelvis FOTO score by at least 10 points as a demontration of improved function.    Baseline Pt did not finish survey in clinic. Survey was e-mailed to pt. (04/25/2020)    Time 8    Period Weeks    Status New    Target Date 06/22/20                  Plan - 04/25/20 1324    Clinical Impression Statement Pt is a 60 year old male who came to physical therapy secondary to B groin pain. He also presents with altered posture, B hip weakness, bilateral hip IR limitations, and difficulty tolerating prolonged positions such as supine, S/L, and sitting secondary to B groin  pain. Pt will benefit from skilled physical therapy services to address the aforementioned deficits.    Personal Factors and Comorbidities Comorbidity 3+;Fitness;Past/Current Experience;Time since onset of injury/illness/exacerbation    Comorbidities HTN, dyspnea, PAF, L knee stiffness    Examination-Activity Limitations Bed Mobility;Sit    Stability/Clinical Decision Making Stable/Uncomplicated    Clinical Decision Making Low    Rehab Potential Fair    PT Frequency 2x / week    PT Duration 8 weeks    PT Treatment/Interventions Therapeutic activities;Therapeutic exercise;Neuromuscular re-education;Patient/family education;Manual techniques;Dry needling;Spinal Manipulations;Joint Manipulations;Aquatic Therapy;Electrical Stimulation;Iontophoresis 4mg /ml Dexamethasone;Traction    PT Home Exercise Plan  hip mobility, trunk and hip strengthening, posture, manual techniques, modalities PRN    Consulted and Agree with Plan of Care Patient           Patient will benefit from skilled therapeutic intervention in order to improve the following deficits and impairments:  Pain,Postural dysfunction,Improper body mechanics,Decreased strength,Decreased range of motion  Visit Diagnosis: Right groin pain - Plan: PT plan of care cert/re-cert  Left groin pain - Plan: PT plan of care cert/re-cert  Low back pain, unspecified back pain laterality, unspecified chronicity, unspecified whether sciatica present - Plan: PT plan of care cert/re-cert     Problem List Patient Active Problem List   Diagnosis Date Noted  . S/P laparoscopic sleeve gastrectomy October 2021 10/26/2019  . B12 deficiency 10/21/2019  . OA (osteoarthritis) 06/23/2018  . History of pulmonary embolus (PE) 06/23/2018  . CHF (congestive heart failure), NYHA class II, chronic, diastolic ([place]) 63/84/5364  . PAF (paroxysmal atrial fibrillation) ([place]) 03/30/2015  . Peroneal DVT (deep venous thrombosis) ([place])   . Hypertension 08/17/2011  .  GERD (gastroesophageal reflux disease) 08/17/2011  . OSA (obstructive sleep apnea) 08/16/2011     Joneen Boers PT, DPT  04/25/2020, 2:06 PM  [place] [facility] [address] [place], [address] Phone: [phone]   Fax:  [phone]  Name: LYAL HUSTED MRN: [id number] Date of Birth: 01/11/1961

## 2020-05-02 ENCOUNTER — Ambulatory Visit: Payer: Medicare Other

## 2020-05-03 ENCOUNTER — Ambulatory Visit: Payer: Medicare Other

## 2020-05-03 ENCOUNTER — Other Ambulatory Visit: Payer: Self-pay

## 2020-05-03 ENCOUNTER — Ambulatory Visit (INDEPENDENT_AMBULATORY_CARE_PROVIDER_SITE_OTHER): Payer: Medicare Other

## 2020-05-03 DIAGNOSIS — R1031 Right lower quadrant pain: Secondary | ICD-10-CM

## 2020-05-03 DIAGNOSIS — R2689 Other abnormalities of gait and mobility: Secondary | ICD-10-CM | POA: Diagnosis not present

## 2020-05-03 DIAGNOSIS — R1032 Left lower quadrant pain: Secondary | ICD-10-CM

## 2020-05-03 DIAGNOSIS — M545 Low back pain, unspecified: Secondary | ICD-10-CM | POA: Diagnosis not present

## 2020-05-03 DIAGNOSIS — E538 Deficiency of other specified B group vitamins: Secondary | ICD-10-CM

## 2020-05-03 MED ORDER — CYANOCOBALAMIN 1000 MCG/ML IJ SOLN
1000.0000 ug | Freq: Once | INTRAMUSCULAR | Status: AC
  Administered 2020-05-03: 1000 ug via INTRAMUSCULAR

## 2020-05-03 NOTE — Progress Notes (Signed)
Per orders of [facility], weekly injection of B12 given by Loreen Freud. Patient tolerated injection well.

## 2020-05-03 NOTE — Patient Instructions (Signed)
Access Code: [email or web address] URL: [email or web address] Date: 05/03/2020 Prepared by: Joneen Boers  Exercises Seated Piriformis Stretch - 3 x daily - 7 x weekly - 1 sets - 5 reps - 30 seconds hold

## 2020-05-03 NOTE — Therapy (Signed)
[place] [facility] [address], [place], [address] Phone: [phone]   Fax:  [phone]  Physical Therapy Treatment  Patient Details  Name: Richard [name] MRN: [id number] Date of Birth: 12/01/1960 Referring Provider (PT): Jearld Fenton, NP   Encounter Date: 05/03/2020   PT End of Session - 05/03/20 1102    Visit Number 2    Number of Visits 17    Date for PT Re-Evaluation 06/22/20    PT Start Time 1102    PT Stop Time 1142    PT Time Calculation (min) 40 min    Activity Tolerance Patient tolerated treatment well    Behavior During Therapy [facility] for tasks assessed/performed           Past Medical History:  Diagnosis Date  . Arthritis   . Arthrofibrosis of total knee arthroplasty ([place]) 05/30/2015  . Bilateral pulmonary embolism ([place])    a. 03/2015 CTA: acute bilat PE; b. IVC filter placed in 2017--> removed 2018.  Marland Kitchen Chest pain    a. 2015 Abnl MV;  b. 2015 Cath: nl cors.  . Chronic diastolic CHF (congestive heart failure) ([place])    a. 03/2015 Echo: EF 55-65%, poor windows, mildly dil RV.  Marland Kitchen DVT (deep venous thrombosis) ([place])    a. 03/2015 U/S: occlusive DVT w/in the distal aspect of the Left fem vein through the L popliteal vein.  Marland Kitchen Dyspnea    On exertion  . Dysrhythmia    afib   . GERD (gastroesophageal reflux disease)   . Headache   . Hypertension   . Hypertensive heart disease   . Obesity   . OSA (obstructive sleep apnea)    uses CPAP nightly settings at 15   . PAF (paroxysmal atrial fibrillation) ([place])   . Pneumonia    hx of years ago   . Primary localized osteoarthritis of left knee    a. 11/2014 s/p L TKA.  . Primary localized osteoarthritis of right knee    a. 02/2014 s/p R Partial Knee arthroplasty.  . Stiffness of left knee    a. 12/2014 s/p manipulation under anesthesia.  . Urine incontinence     Past Surgical History:  Procedure Laterality Date  . APPENDECTOMY    . CARDIAC  CATHETERIZATION    . CARPAL TUNNEL RELEASE Left 03/2018  . CYSTOSCOPY W/ RETROGRADES  12/09/2019   Procedure: CYSTOSCOPY WITH RETROGRADE PYELOGRAM;  Surgeon: Royston Cowper, MD;  Location: [facility];  Service: Urology;;  . Consuela Mimes WITH STENT PLACEMENT Right 12/09/2019   Procedure: CYSTOSCOPY WITH STENT PLACEMENT;  Surgeon: Royston Cowper, MD;  Location: [facility];  Service: Urology;  Laterality: Right;  . EXAM UNDER ANESTHESIA WITH MANIPULATION OF KNEE Left 05/30/2015   Procedure: EXAM UNDER ANESTHESIA WITH MANIPULATION OF LEFT KNEE;  Surgeon: Marchia Bond, MD;  Location: [place];  Service: Orthopedics;  Laterality: Left;  . HIATAL HERNIA REPAIR N/A 10/26/2019   Procedure: HIATAL HERNIA REPAIR;  Surgeon: Johnathan Hausen, MD;  Location: [facility];  Service: General;  Laterality: N/A;  . I & D KNEE WITH POLY EXCHANGE Left 10/30/2015   Procedure: IRRIGATION AND DEBRIDEMENT KNEE WITH POLY EXCHANGE PLACE SPACERS;  Surgeon: Frederik Pear, MD;  Location: [place];  Service: Orthopedics;  Laterality: Left;  OFF ELIQUIST 3 DAYS  . IVC FILTER PLACEMENT ([facility] HX)  04/13/15  . IVC FILTER REMOVAL N/A 08/13/2016   Procedure: IVC Filter Removal;  Surgeon: Katha Cabal, MD;  Location: [place] [facility];  Service: Cardiovascular;  Laterality: N/A;  . JOINT REPLACEMENT    . KNEE ARTHROSCOPY Left 08/08/2015   Procedure: LEFT KNEE MANIPULATION WITH ARTHROSCOPIC LYSIS OF ADHESIONS AND ASPIRATION;  Surgeon: Marchia Bond, MD;  Location: [place];  Service: Orthopedics;  Laterality: Left;  . KNEE CLOSED REDUCTION Left 01/20/2015   Procedure: LEFT KNEE MANIPULATION;  Surgeon: Marchia Bond, MD;  Location: [place];  Service: Orthopedics;  Laterality: Left;  . LAPAROSCOPIC GASTRIC SLEEVE RESECTION N/A 10/26/2019   Procedure: LAPAROSCOPIC SLEEVE GASTRECTOMY;  Surgeon: Johnathan Hausen, MD;  Location[facility];  Service: General;  Laterality: N/A;  . LEFT HEART CATHETERIZATION WITH CORONARY ANGIOGRAM N/A  01/12/2014   Procedure: LEFT HEART CATHETERIZATION WITH CORONARY ANGIOGRAM;  Surgeon: Jettie Booze, MD;  Location: [facility];  Service: Cardiovascular;  Laterality: N/A;  . ORTHOPEDIC SURGERY Left    arthroscopy x3  . PARTIAL KNEE ARTHROPLASTY Right 02/25/2014   Procedure: RIGHT KNEE ARTHROPLASTY CONDYLE AND PLATEAU MEDIAL COMPARTMENT ;  Surgeon: Johnny Bridge, MD;  Location: [place];  Service: Orthopedics;  Laterality: Right;  . PERIPHERAL VASCULAR CATHETERIZATION N/A 03/29/2015   Procedure: IVC Filter Insertion, and possible thrombectomy;  Surgeon: Katha Cabal, MD;  Location: [place] [facility];  Service: Cardiovascular;  Laterality: N/A;  . ROTATOR CUFF REPAIR Left   . TOTAL KNEE ARTHROPLASTY  12/06/2014   Procedure: TOTAL KNEE ARTHROPLASTY;  Surgeon: Marchia Bond, MD;  Location: [place];  Service: Orthopedics;;  . TOTAL KNEE REVISION Left 02/08/2016   Procedure: TOTAL KNEE REVISION;  Surgeon: Frederik Pear, MD;  Location: [place];  Service: Orthopedics;  Laterality: Left;  . UPPER GI ENDOSCOPY N/A 10/26/2019   Procedure: UPPER GI ENDOSCOPY;  Surgeon: Johnathan Hausen, MD;  [facility];  Service: General;  Laterality: N/A;  . URETEROSCOPY WITH HOLMIUM LASER LITHOTRIPSY Right 12/30/2019   Procedure: URETEROSCOPY WITH HOLMIUM LASER LITHOTRIPSY;  Surgeon: Royston Cowper, MD;  Location: [facility];  Service: Urology;  Laterality: Right;    There were no vitals filed for this visit.   Subjective Assessment - 05/03/20 1104    Subjective B groin was a 7-8/10 this morning. No pain right now. Getting up and moving helps. Sitting for a long period or sleeping bothers it.    Pertinent History B groin pain. Pt fell in his yard about a year ago which started his B groin pain. Pt landed on his tail bone. Pain has not chaged overall. However, about 1-2x a month the pain hurts so bad that he could not lift his legs up for 2-3 days. Has not had any MRI or CT for his back. Denies  loss of bowel or bladder control. No tingling or numbness from the waist down, just a sharp pain under his belly button and groin area. No LE weakness that he knows of. Back has been hurting him too. Chiropractor thinks the groin pain are from the arthritis in his hips.   L knee stiffness/TKA   Patient Stated Goals Be better able to move his legs to get out of the bed.    Currently in Pain? No/denies    Pain Score 0-No pain    Pain Onset More than a month ago                                     PT Education - 05/03/20  1107    Education Details ther-ex    Person(s) Educated Patient    Methods Explanation;Demonstration;Tactile cues;Verbal cues    Comprehension Verbalized understanding;Returned demonstration          Objective    Blood pressure is controlled per pt.   Gait: antalgic, decreased stance R LE  Sitting: B groin pain  Standing: no pain  Supine hip flexion, IR and adduction             L: tailbone pain             R : tailbone pain  Medbridge Access Code [email or web address]  Therapeutic Exercise  Seated hip piriformis stretch   R 30 seconds x 5   Supine piriformis stretch with PT   L 30 seconds x 5  Supine with hip at 90/90  Hip IR    L 10x3   R 10x3  Supine long axis traction hip L, hands at thighs to protect knee joint.   Seated hip adduction isometrics 10x5 seconds for 3 sets   Seated manually resisted L lumbar side bend isometrics in neutral to decrease R lumbar convexity 10x3 with 5 second holds   Seated manually resisted R upper trunk rotation to promote L lower trunk rotation and more neutral posture 10x5 seconds for 3 sets   Improved exercise technique, movement at target joints, use of target muscles after mod verbal, visual, tactile cues.    Response to treatment No complain of pain throughout session.   Clinical impression Worked on improving hip IR ROM as well as posture and trunk strength to promote movement  and decrease stress to low back. Pt tolerated session well without aggravation of symptoms. Pt will benefit from continued skilled physical therapy services to decrease pain, improve strength and function.      PT Short Term Goals - 04/25/20 1341      PT SHORT TERM GOAL #1   Title Pt will be independent with his initial HEP to decrease pain, improve strength and ability to tolerate static positions such as supine and sitting more comfortably.    Baseline Pt has started his HEP (04/25/2020)    Time 3    Period Weeks    Status New    Target Date 05/18/20             PT Long Term Goals - 04/25/20 1345      PT LONG TERM GOAL #1   Title Patient will have a decrease in B groin pain to 3/10 or less at worst to promote ability to tolerated sittng and supine and S/L static postitions more comfortably.    Baseline 9/10 B groin pain at most (04/25/2020)    Time 8    Period Weeks    Status New    Target Date 06/22/20      PT LONG TERM GOAL #2   Title Pt will improve bilateral hip extension and abduction strength by at least 1/2 MMT grade to promote ability to perform functional tasks more comfortably.    Baseline hip extension 4-/5 R, and L; hip abduction 4-/5 R and L (04/25/2020)    Time 8    Period Weeks    Status New    Target Date 06/22/20      PT LONG TERM GOAL #3   Title Pt will improve bilateral hip IR by at least 10 degrees to promote mobility as well as ability to tolerate static positions more comfortably.    Baseline hip IR 21  degrees R, 16 degrees L (04/25/2020)    Time 8    Period Weeks    Status New    Target Date 06/22/20      PT LONG TERM GOAL #4   Title Pt will improve his pelvis FOTO score by at least 10 points as a demontration of improved function.    Baseline Pt did not finish survey in clinic. Survey was e-mailed to pt. (04/25/2020)    Time 8    Period Weeks    Status New    Target Date 06/22/20                 Plan - 05/03/20 1107    Clinical Impression  Statement Worked on improving hip IR ROM as well as posture and trunk strength to promote movement and decrease stress to low back. Pt tolerated session well without aggravation of symptoms. Pt will benefit from continued skilled physical therapy services to decrease pain, improve strength and function.    Personal Factors and Comorbidities Comorbidity 3+;Fitness;Past/Current Experience;Time since onset of injury/illness/exacerbation    Comorbidities HTN, dyspnea, PAF, L knee stiffness    Examination-Activity Limitations Bed Mobility;Sit    Stability/Clinical Decision Making Stable/Uncomplicated    Clinical Decision Making Low    Rehab Potential Fair    PT Frequency 2x / week    PT Duration 8 weeks    PT Treatment/Interventions Therapeutic activities;Therapeutic exercise;Neuromuscular re-education;Patient/family education;Manual techniques;Dry needling;Spinal Manipulations;Joint Manipulations;Aquatic Therapy;Electrical Stimulation;Iontophoresis 4mg /ml Dexamethasone;Traction    PT Home Exercise Plan hip mobility, trunk and hip strengthening, posture, manual techniques, modalities PRN    Consulted and Agree with Plan of Care Patient           Patient will benefit from skilled therapeutic intervention in order to improve the following deficits and impairments:  Pain,Postural dysfunction,Improper body mechanics,Decreased strength,Decreased range of motion  Visit Diagnosis: Right groin pain  Left groin pain  Low back pain, unspecified back pain laterality, unspecified chronicity, unspecified whether sciatica present     Problem List Patient Active Problem List   Diagnosis Date Noted  . S/P laparoscopic sleeve gastrectomy October 2021 10/26/2019  . B12 deficiency 10/21/2019  . OA (osteoarthritis) 06/23/2018  . History of pulmonary embolus (PE) 06/23/2018  . CHF (congestive heart failure), NYHA class II, chronic, diastolic ([place]) 40/10/2723  . PAF (paroxysmal atrial fibrillation) ([place])  03/30/2015  . Peroneal DVT (deep venous thrombosis) ([place])   . Hypertension 08/17/2011  . GERD (gastroesophageal reflux disease) 08/17/2011  . OSA (obstructive sleep apnea) 08/16/2011    Joneen Boers PT, DPT   05/03/2020, 11:46 AM  [place] [facility] [facility] [address], [place], [address] Phone: [phone]   Fax:  [phone]  Name: VISHAL SANDLIN MRN: [id number] Date of Birth: 11/23/1960

## 2020-05-09 ENCOUNTER — Ambulatory Visit: Payer: Medicare Other

## 2020-05-10 ENCOUNTER — Other Ambulatory Visit: Payer: Self-pay

## 2020-05-16 ENCOUNTER — Other Ambulatory Visit: Payer: Self-pay

## 2020-05-16 ENCOUNTER — Ambulatory Visit: Payer: Medicare Other

## 2020-05-16 DIAGNOSIS — R1031 Right lower quadrant pain: Secondary | ICD-10-CM | POA: Diagnosis not present

## 2020-05-16 DIAGNOSIS — R2689 Other abnormalities of gait and mobility: Secondary | ICD-10-CM | POA: Diagnosis not present

## 2020-05-16 DIAGNOSIS — R1032 Left lower quadrant pain: Secondary | ICD-10-CM

## 2020-05-16 DIAGNOSIS — M545 Low back pain, unspecified: Secondary | ICD-10-CM

## 2020-05-16 NOTE — Therapy (Signed)
[facility] [address] [place], [address] Phone: [phone]   Fax:  [phone]  Physical Therapy Treatment  Patient Details  Name: Richard [name] MRN: [id number] Date of Birth: 05/02/1960 Referring Provider (PT): Jearld Fenton, NP   Encounter Date: 05/16/2020   PT End of Session - 05/16/20 1149    Visit Number 3    Number of Visits 17    Date for PT Re-Evaluation 06/22/20    PT Start Time [id number]    PT Stop Time 1231    PT Time Calculation (min) 42 min    Activity Tolerance Patient tolerated treatment well    Behavior During Therapy [facility] for tasks assessed/performed           Past Medical History:  Diagnosis Date  . Arthritis   . Arthrofibrosis of total knee arthroplasty ([place]) 05/30/2015  . Bilateral pulmonary embolism ([place])    a. 03/2015 CTA: acute bilat PE; b. IVC filter placed in 2017--> removed 2018.  Marland Kitchen Chest pain    a. 2015 Abnl MV;  b. 2015 Cath: nl cors.  . Chronic diastolic CHF (congestive heart failure) ([place])    a. 03/2015 Echo: EF 55-65%, poor windows, mildly dil RV.  Marland Kitchen DVT (deep venous thrombosis) ([place])    a. 03/2015 U/S: occlusive DVT w/in the distal aspect of the Left fem vein through the L popliteal vein.  Marland Kitchen Dyspnea    On exertion  . Dysrhythmia    afib   . GERD (gastroesophageal reflux disease)   . Headache   . Hypertension   . Hypertensive heart disease   . Obesity   . OSA (obstructive sleep apnea)    uses CPAP nightly settings at 15   . PAF (paroxysmal atrial fibrillation) ([place])   . Pneumonia    hx of years ago   . Primary localized osteoarthritis of left knee    a. 11/2014 s/p L TKA.  . Primary localized osteoarthritis of right knee    a. 02/2014 s/p R Partial Knee arthroplasty.  . Stiffness of left knee    a. 12/2014 s/p manipulation under anesthesia.  . Urine incontinence     Past Surgical History:  Procedure Laterality Date  . APPENDECTOMY    . CARDIAC  CATHETERIZATION    . CARPAL TUNNEL RELEASE Left 03/2018  . CYSTOSCOPY W/ RETROGRADES  12/09/2019   Procedure: CYSTOSCOPY WITH RETROGRADE PYELOGRAM;  Surgeon: Royston Cowper, MD;  Location: [facility];  Service: Urology;;  . Consuela Mimes WITH STENT PLACEMENT Right 12/09/2019   Procedure: CYSTOSCOPY WITH STENT PLACEMENT;  Surgeon: Royston Cowper, MD;  Location: [facility];  Service: Urology;  Laterality: Right;  . EXAM UNDER ANESTHESIA WITH MANIPULATION OF KNEE Left 05/30/2015   Procedure: EXAM UNDER ANESTHESIA WITH MANIPULATION OF LEFT KNEE;  Surgeon: Marchia Bond, MD;  Location: [facility];  Service: Orthopedics;  Laterality: Left;  . HIATAL HERNIA REPAIR N/A 10/26/2019   Procedure: HIATAL HERNIA REPAIR;  Surgeon: Johnathan Hausen, MD;  Location: [facility];  Service: General;  Laterality: N/A;  . I & D KNEE WITH POLY EXCHANGE Left 10/30/2015   Procedure: IRRIGATION AND DEBRIDEMENT KNEE WITH POLY EXCHANGE PLACE SPACERS;  Surgeon: Frederik Pear, MD;  Location: [facility];  Service: Orthopedics;  Laterality: Left;  OFF ELIQUIST 3 DAYS  . IVC FILTER PLACEMENT ([facility] HX)  04/13/15  . IVC FILTER REMOVAL N/A 08/13/2016   Procedure: IVC Filter Removal;  Surgeon: Katha Cabal, MD;  Location: [facility];  Service: Cardiovascular;  Laterality: N/A;  . JOINT REPLACEMENT    . KNEE ARTHROSCOPY Left 08/08/2015   Procedure: LEFT KNEE MANIPULATION WITH ARTHROSCOPIC LYSIS OF ADHESIONS AND ASPIRATION;  Surgeon: Marchia Bond, MD;  Location: [facility];  Service: Orthopedics;  Laterality: Left;  . KNEE CLOSED REDUCTION Left 01/20/2015   Procedure: LEFT KNEE MANIPULATION;  Surgeon: Marchia Bond, MD;  Location: [facility];  Service: Orthopedics;  Laterality: Left;  . LAPAROSCOPIC GASTRIC SLEEVE RESECTION N/A 10/26/2019   Procedure: LAPAROSCOPIC SLEEVE GASTRECTOMY;  Surgeon: Johnathan Hausen, MD;  [facility];  Service: General;  Laterality: N/A;  . LEFT HEART CATHETERIZATION WITH CORONARY ANGIOGRAM N/A  01/12/2014   Procedure: LEFT HEART CATHETERIZATION WITH CORONARY ANGIOGRAM;  Surgeon: Jettie Booze, MD;  Location: [facility];  Service: Cardiovascular;  Laterality: N/A;  . ORTHOPEDIC SURGERY Left    arthroscopy x3  . PARTIAL KNEE ARTHROPLASTY Right 02/25/2014   Procedure: RIGHT KNEE ARTHROPLASTY CONDYLE AND PLATEAU MEDIAL COMPARTMENT ;  Surgeon: Johnny Bridge, MD;  Location: [facility];  Service: Orthopedics;  Laterality: Right;  . PERIPHERAL VASCULAR CATHETERIZATION N/A 03/29/2015   Procedure: IVC Filter Insertion, and possible thrombectomy;  Surgeon: Katha Cabal, MD;  Location: [facility];  Service: Cardiovascular;  Laterality: N/A;  . ROTATOR CUFF REPAIR Left   . TOTAL KNEE ARTHROPLASTY  12/06/2014   Procedure: TOTAL KNEE ARTHROPLASTY;  Surgeon: Marchia Bond, MD;  Location: [facility];  Service: Orthopedics;;  . TOTAL KNEE REVISION Left 02/08/2016   Procedure: TOTAL KNEE REVISION;  Surgeon: Frederik Pear, MD;  Location: [facility];  Service: Orthopedics;  Laterality: Left;  . UPPER GI ENDOSCOPY N/A 10/26/2019   Procedure: UPPER GI ENDOSCOPY;  Surgeon: Johnathan Hausen, MD;  [facility];  Service: General;  Laterality: N/A;  . URETEROSCOPY WITH HOLMIUM LASER LITHOTRIPSY Right 12/30/2019   Procedure: URETEROSCOPY WITH HOLMIUM LASER LITHOTRIPSY;  Surgeon: Royston Cowper, MD;  Location: [facility];  Service: Urology;  Laterality: Right;    There were no vitals filed for this visit.   Subjective Assessment - 05/16/20 1150    Subjective B groin soreness was about a 5/10 when he got up. No sorness right now. The only time it hurts is when he gets out of bed, and when he gets real relaxed. 7/10 at most for the past 7 days. Was fair after last session. Pain is mostly at the hip joint (hip flexor area)    Pertinent History B groin pain. Pt fell in his yard about a year ago which started his B groin pain. Pt landed on his tail bone. Pain has not chaged overall. However,  about 1-2x a month the pain hurts so bad that he could not lift his legs up for 2-3 days. Has not had any MRI or CT for his back. Denies loss of bowel or bladder control. No tingling or numbness from the waist down, just a sharp pain under his belly button and groin area. No LE weakness that he knows of. Back has been hurting him too. Chiropractor thinks the groin pain are from the arthritis in his hips.   L knee stiffness/TKA   Patient Stated Goals Be better able to move his legs to get out of the bed.    Currently in Pain? No/denies    Pain Score 0-No pain    Pain Onset More than a month ago  PT Education - 05/16/20 1204    Education Details ther-ex    Person(s) Educated Patient    Methods Explanation;Demonstration;Tactile cues;Verbal cues    Comprehension Returned demonstration;Verbalized understanding          Objective    Blood pressure is controlled per pt.   Gait: antalgic, decreased stance R LE  Sitting: B groin pain  Standing: no pain  Supine hip flexion, IR and adduction L: tailbone pain R : tailbone pain  Medbridge Access Code [email or web address]  Therapeutic Exercise  Seated manually resisted L lateral shift isometrics in neutral to decrease R lateral shift posture 10x3 with 5 second holds   Seated thoracic extension over chair to decrease stress to low back 10x3 with 5 second holds   Seated hip piriformis stretch              R 30 seconds x 5   L 30 seconds x 5    Initial L low back pull which eased off with repetition  Seated manually resisted R upper trunk rotation to promote L lower trunk rotation and more neutral posture 10x5 seconds for 3 sets  Supine with hip at 90/90             Hip IR                          L 10x3                            Improved exercise technique, movement at target joints, use of target muscles after mod verbal, visual, tactile cues.     Manual therapy  Supine inferior glide L hip grade 3 to promote mobility Pt states L hip did not feel as tight afterwards   Response to treatment No complain of pain throughout session. Decreased L hip tightness reported after session.   Clinical impression Continued working on promoting more neutral lumbar posture as well as trunk strength to promote better mechanics at hip joints. Continued working on improving hip mobility to decrease stiffness. Improved L hip level of comfort reported after session. Pt will benefit from continued skilled physical therapy services to decrease pain, improve strength and function.         PT Short Term Goals - 04/25/20 1341      PT SHORT TERM GOAL #1   Title Pt will be independent with his initial HEP to decrease pain, improve strength and ability to tolerate static positions such as supine and sitting more comfortably.    Baseline Pt has started his HEP (04/25/2020)    Time 3    Period Weeks    Status New    Target Date 05/18/20             PT Long Term Goals - 04/25/20 1345      PT LONG TERM GOAL #1   Title Patient will have a decrease in B groin pain to 3/10 or less at worst to promote ability to tolerated sittng and supine and S/L static postitions more comfortably.    Baseline 9/10 B groin pain at most (04/25/2020)    Time 8    Period Weeks    Status New    Target Date 06/22/20      PT LONG TERM GOAL #2   Title Pt will improve bilateral hip extension and abduction strength by at least 1/2 MMT grade to promote ability to perform  functional tasks more comfortably.    Baseline hip extension 4-/5 R, and L; hip abduction 4-/5 R and L (04/25/2020)    Time 8    Period Weeks    Status New    Target Date 06/22/20      PT LONG TERM GOAL #3   Title Pt will improve bilateral hip IR by at least 10 degrees to promote mobility as well as ability to tolerate static positions more comfortably.    Baseline hip IR 21 degrees R, 16 degrees L  (04/25/2020)    Time 8    Period Weeks    Status New    Target Date 06/22/20      PT LONG TERM GOAL #4   Title Pt will improve his pelvis FOTO score by at least 10 points as a demontration of improved function.    Baseline Pt did not finish survey in clinic. Survey was e-mailed to pt. (04/25/2020)    Time 8    Period Weeks    Status New    Target Date 06/22/20                 Plan - 05/16/20 1204    Clinical Impression Statement Continued working on promoting more neutral lumbar posture as well as trunk strength to promote better mechanics at hip joints. Continued working on improving hip mobility to decrease stiffness. Improved L hip level of comfort reported after session. Pt will benefit from continued skilled physical therapy services to decrease pain, improve strength and function.    Personal Factors and Comorbidities Comorbidity 3+;Fitness;Past/Current Experience;Time since onset of injury/illness/exacerbation    Comorbidities HTN, dyspnea, PAF, L knee stiffness    Examination-Activity Limitations Bed Mobility;Sit    Stability/Clinical Decision Making Stable/Uncomplicated    Rehab Potential Fair    PT Frequency 2x / week    PT Duration 8 weeks    PT Treatment/Interventions Therapeutic activities;Therapeutic exercise;Neuromuscular re-education;Patient/family education;Manual techniques;Dry needling;Spinal Manipulations;Joint Manipulations;Aquatic Therapy;Electrical Stimulation;Iontophoresis 4mg /ml Dexamethasone;Traction    PT Home Exercise Plan hip mobility, trunk and hip strengthening, posture, manual techniques, modalities PRN    Consulted and Agree with Plan of Care Patient           Patient will benefit from skilled therapeutic intervention in order to improve the following deficits and impairments:  Pain,Postural dysfunction,Improper body mechanics,Decreased strength,Decreased range of motion  Visit Diagnosis: Left groin pain  Right groin pain  Low back pain,  unspecified back pain laterality, unspecified chronicity, unspecified whether sciatica present     Problem List Patient Active Problem List   Diagnosis Date Noted  . S/P laparoscopic sleeve gastrectomy October 2021 10/26/2019  . B12 deficiency 10/21/2019  . OA (osteoarthritis) 06/23/2018  . History of pulmonary embolus (PE) 06/23/2018  . CHF (congestive heart failure), NYHA class II, chronic, diastolic ([place]) 32/99/2426  . PAF (paroxysmal atrial fibrillation) ([place]) 03/30/2015  . Peroneal DVT (deep venous thrombosis) ([place])   . Hypertension 08/17/2011  . GERD (gastroesophageal reflux disease) 08/17/2011  . OSA (obstructive sleep apnea) 08/16/2011    Joneen Boers PT, DPT    05/16/2020, 12:51 PM  [facility] [address] [place], [address] Phone: [phone]   Fax:  [phone]  Name: Richard [name] MRN: [id number] Date of Birth: 1960-07-24

## 2020-05-17 ENCOUNTER — Ambulatory Visit (INDEPENDENT_AMBULATORY_CARE_PROVIDER_SITE_OTHER): Payer: Medicare Other | Admitting: Internal Medicine

## 2020-05-17 ENCOUNTER — Other Ambulatory Visit: Payer: Self-pay

## 2020-05-17 ENCOUNTER — Encounter: Payer: Self-pay | Admitting: Internal Medicine

## 2020-05-17 VITALS — BP 126/63 | HR 53 | Temp 98.0°F | Resp 18 | Ht 69.0 in | Wt 227.6 lb

## 2020-05-17 DIAGNOSIS — J301 Allergic rhinitis due to pollen: Secondary | ICD-10-CM | POA: Diagnosis not present

## 2020-05-17 MED ORDER — METHYLPREDNISOLONE ACETATE 80 MG/ML IJ SUSP
80.0000 mg | Freq: Once | INTRAMUSCULAR | Status: AC
  Administered 2020-05-17: 80 mg via INTRAMUSCULAR

## 2020-05-17 MED ORDER — CYANOCOBALAMIN 1000 MCG/ML IJ SOLN: 1000.0000 ug | INTRAMUSCULAR | 1 refills | Status: DC

## 2020-05-17 NOTE — Addendum Note (Signed)
Addended by: Wilson Singer on: 05/17/2020 11:21 AM   Modules accepted: Orders

## 2020-05-17 NOTE — Progress Notes (Signed)
HPI  Pt presents to the clinic today with c/o headache, runny nose and cough. This started 3 days ago. The headache is located in his forehead. He describes the pain as pressure. He denies dizziness or visual changes. He is blowing clear mucous out of his nose. The cough is productive of yellow mucous. He denies facial pain or pressure, nasal congestion, ear pain, sore throat, shortness of breath, nausea, vomiting, diarrhea. He denies fever, chills or body aches. He has tried Mucinex OTC with minimal relief of symptoms. He has had sick contacts with similar symptoms but denies exposure to Covid that he is aware of. He took a home Covid test which was negative.    Review of Systems      Past Medical History:  Diagnosis Date  . Arthritis   . Arthrofibrosis of total knee arthroplasty ([place]) 05/30/2015  . Bilateral pulmonary embolism ([place])    a. 03/2015 CTA: acute bilat PE; b. IVC filter placed in 2017--> removed 2018.  Marland Kitchen Chest pain    a. 2015 Abnl MV;  b. 2015 Cath: nl cors.  . Chronic diastolic CHF (congestive heart failure) ([place])    a. 03/2015 Echo: EF 55-65%, poor windows, mildly dil RV.  Marland Kitchen DVT (deep venous thrombosis) ([place])    a. 03/2015 U/S: occlusive DVT w/in the distal aspect of the Left fem vein through the L popliteal vein.  Marland Kitchen Dyspnea    On exertion  . Dysrhythmia    afib   . GERD (gastroesophageal reflux disease)   . Headache   . Hypertension   . Hypertensive heart disease   . Obesity   . OSA (obstructive sleep apnea)    uses CPAP nightly settings at 15   . PAF (paroxysmal atrial fibrillation) ([place])   . Pneumonia    hx of years ago   . Primary localized osteoarthritis of left knee    a. 11/2014 s/p L TKA.  . Primary localized osteoarthritis of right knee    a. 02/2014 s/p R Partial Knee arthroplasty.  . Stiffness of left knee    a. 12/2014 s/p manipulation under anesthesia.  . Urine incontinence     Family History  Problem Relation Age of Onset  . Coronary artery  disease Mother   . Hyperlipidemia Mother   . Hypertension Mother   . Arthritis Mother   . Coronary artery disease Father   . Heart disease Paternal Grandmother   . Alzheimer's disease Paternal Grandfather     Social History   Socioeconomic History  . Marital status: Married    Spouse name: Not on file  . Number of children: 2  . Years of education: 11  . Highest education level: Not on file  Occupational History  . Occupation: Energy manager: [employer]  Tobacco Use  . Smoking status: Never Smoker  . Smokeless tobacco: Never Used  Vaping Use  . Vaping Use: Never used  Substance and Sexual Activity  . Alcohol use: Not Currently    Comment: occassional  . Drug use: No  . Sexual activity: Yes  Other Topics Concern  . Not on file  Social History Narrative   ** Merged History Encounter **       Social Determinants of Health   Financial Resource Strain: Not on file  Food Insecurity: Not on file  Transportation Needs: Not on file  Physical Activity: Not on file  Stress: Not on file  Social Connections: Not on file  Intimate Partner Violence: Not  on file    Allergies  Allergen Reactions  . Penicillins Anaphylaxis and Other (See Comments)    From childhood; uncertain of reaction Has patient had a PCN reaction causing immediate rash, facial/tongue/throat swelling, SOB or lightheadedness with hypotension: Unk Has patient had a PCN reaction causing severe rash involving mucus membranes or skin necrosis: Unk Has patient had a PCN reaction that required hospitalization: Unk Has patient had a PCN reaction occurring within the last 10 years: No If all of the above answers are "NO", then may proceed with Cephalosporin use.  . Vancomycin Other (See Comments)    Red Mans' syndrome  . Penicillins Other (See Comments)    Childhood  . Vancomycin Other (See Comments)    Red man Syndrome     Constitutional: Positive headache. Denies fatigue, fever or abrupt weight changes.   HEENT:  Positive runny nose. Denies eye redness, eye pain, pressure behind the eyes, facial pain, nasal congestion, ear pain, ringing in the ears, wax buildup, or sore throat. Respiratory: Positive cough. Denies difficulty breathing or shortness of breath.  Cardiovascular: Denies chest pain, chest tightness, palpitations or swelling in the hands or feet.   No other specific complaints in a complete review of systems (except as listed in HPI above).  Objective:  BP 126/63 (BP Location: Right Arm, Patient Position: Sitting, Cuff Size: Large)   Pulse (!) 53   Temp 98 F (36.7 C) (Oral)   Resp 18   Ht 5\' 9"  (1.753 m)   Wt 227 lb 9.6 oz (103.2 kg)   SpO2 100%   BMI 33.61 kg/m   Wt Readings from Last 3 Encounters:  04/18/20 228 lb (103.4 kg)  03/28/20 229 lb (103.9 kg)  02/24/20 235 lb 12.8 oz (107 kg)     General: Appears his stated age, obese, in NAD. Skin: No rash noted. HEENT: Head: normal shape and size, no sinus pressure tenderness noted; Eyes: sclera white, no icterus, conjunctiva pink;Throat/Mouth: + PND. Teeth present, mucosa erythematous and moist, no exudate noted, no lesions or ulcerations noted.  Neck: No cervical lymphadenopathy.  Cardiovascular: Bradycardic with normal rhythm. S1,S2 noted.  No murmur, rubs or gallops noted.  Pulmonary/Chest: Normal effort and positive vesicular breath sounds. No respiratory distress. No wheezes, rales or ronchi noted.       Assessment & Plan:   Allergic Rhinitis  Get some rest and drink plenty of water 80 mg Depo IM Could start an antihistamine such as Zyrtec, Allegra or Claritin OTC No indication for abx at this time  RTC as needed or if symptoms persist or worsen.   Webb Silversmith, NP This visit occurred during the SARS-CoV-2 public health emergency.  Safety protocols were in place, including screening questions prior to the visit, additional usage of staff PPE, and extensive cleaning of exam room while observing appropriate  contact time as indicated for disinfecting solutions.

## 2020-05-22 ENCOUNTER — Ambulatory Visit: Payer: Medicare Other | Attending: Internal Medicine

## 2020-05-24 ENCOUNTER — Ambulatory Visit: Payer: Medicare Other

## 2020-05-24 ENCOUNTER — Telehealth: Payer: Self-pay

## 2020-05-24 NOTE — Telephone Encounter (Signed)
Copied from [place] [id number]. Topic: Appointment Scheduling - Scheduling Inquiry for Clinic >> May 18, 2020 12:24 PM [name], Richard wrote: Reason for CRM: Pt stated he had an appt yesterday but he could not get the B-12 shot due to office not having it. Pt stated he has the B-12 and he was told to call back so he could come in. Call was during lunch so unable to call office. Pt requests call back

## 2020-05-25 ENCOUNTER — Other Ambulatory Visit: Payer: Self-pay

## 2020-05-25 ENCOUNTER — Encounter: Payer: Medicare Other | Attending: Surgery | Admitting: Skilled Nursing Facility1

## 2020-05-25 ENCOUNTER — Ambulatory Visit (INDEPENDENT_AMBULATORY_CARE_PROVIDER_SITE_OTHER): Payer: Medicare Other

## 2020-05-25 DIAGNOSIS — K219 Gastro-esophageal reflux disease without esophagitis: Secondary | ICD-10-CM | POA: Insufficient documentation

## 2020-05-25 DIAGNOSIS — E538 Deficiency of other specified B group vitamins: Secondary | ICD-10-CM

## 2020-05-25 DIAGNOSIS — E669 Obesity, unspecified: Secondary | ICD-10-CM | POA: Diagnosis not present

## 2020-05-25 DIAGNOSIS — I509 Heart failure, unspecified: Secondary | ICD-10-CM | POA: Insufficient documentation

## 2020-05-25 DIAGNOSIS — Z9884 Bariatric surgery status: Secondary | ICD-10-CM | POA: Insufficient documentation

## 2020-05-25 DIAGNOSIS — I110 Hypertensive heart disease with heart failure: Secondary | ICD-10-CM | POA: Insufficient documentation

## 2020-05-25 MED ORDER — CYANOCOBALAMIN 1000 MCG/ML IJ SOLN
1000.0000 ug | Freq: Once | INTRAMUSCULAR | Status: AC
  Administered 2020-05-25: 1000 ug via INTRAMUSCULAR

## 2020-05-25 NOTE — Progress Notes (Signed)
Bariatric Nutrition Follow-Up Visit Medical Nutrition Therapy   Post-Operative sleeve Surgery Surgery Date: 10/26/2019  NUTRITION ASSESSMENT  Anthropometrics  Start weight at [facility]: 302.4 lbs  Today's weight: 225 lbs  Body Composition Scale Date  Weight  lbs 253.8  Total Body Fat  % 33.1     Visceral Fat 26  Fat-Free Mass  % 66.8     Total Body Water  % 47.8     Muscle-Mass  lbs 47.8  BMI 37.2  Body Fat Displacement ---        Torso  lbs 52.1        Left Leg  lbs 10.4        Right Leg  lbs 10.4        Left Arm  lbs 5.2        Right Arm  lbs 5.2   Clinical  Medical hx: HTN, CHF. GERD, Renal Stone Medications: off HTN meds Labs:    Lifestyle & Dietary Hx  Pt states he wants his weight to be 215 pounds.  Pt states he does not eat lunch because he eats dinner early.  Pt state she drinks 3-4 protein shakes a day since surgery. Pt states he never mentioned them before because he sis not think it mattered.  Pt states he tried a beer the other day but it was gross.  Pt state she skips a lot of his meals because he does not feel hunger: Dietitian advised pt he should not exceed 5 hours without eating. Pt states he is doing PT for his groin pain. 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   Estimated daily fluid intake: 16.9 X 5-6 oz Estimated daily protein intake: 100+g Supplements: inappropriate bari multi and calcium and b12 shot (b12 4 months ago 633) Current average weekly physical activity: walk 1-2 miles on a usual day  24-Hr Dietary Recall First Meal 8:30-9: 1 or 2 egg + 1 sausage + tomatoes + cheese or 2 eggs + grits + protein shakes Snack: sometimes apple Second Meal: grilled chicken or grilled pork chop + salad usually only chicken or skipped or protein shakes Snack:  Third Meal 4:30-5pm: chicken or fish or pork + green beans sometimes beans + tomatoes + cucumbers  Snack:  Beverages: water and gatorade zero and diluted sweet tea  Post-Op  Goals/ Signs/ Symptoms Using straws: no Drinking while eating: no Chewing/swallowing difficulties: no Changes in vision: no Changes to mood/headaches: no Hair loss/changes to skin/nails: no Difficulty focusing/concentrating: no Sweating: no Dizziness/lightheadedness: no Palpitations: no Carbonated/caffeinated beverages: no N/V/D/C/Gas: no Abdominal pain: no Dumping syndrome: no    NUTRITION DIAGNOSIS  Overweight/obesity ([place]-3.3) related to past poor dietary habits and physical inactivity as evidenced by completed bariatric surgery and following dietary guidelines for continued weight loss and healthy nutrition status.     NUTRITION INTERVENTION Nutrition counseling (C-1) and education (E-2) to facilitate bariatric surgery goals, including: . The importance of consuming adequate calories as well as certain nutrients daily due to the body's need for essential vitamins, minerals, and fats . The importance of daily physical activity and to reach a goal of at least 150 minutes of moderate to vigorous physical activity weekly (or as directed by their physician) due to benefits such as increased musculature and improved lab values . The importance of intuitive eating specifically learning hunger-satiety cues and understanding the importance of learning a new body: The importance of mindful eating to avoid grazing behaviors  Goals: Limit protein shakes to one per day Eat a meal in the middle of the day every day   Handouts Previously Provided Include   Phase 4 handout  Learning Style & Readiness for Change Teaching method utilized: Visual & Auditory  Demonstrated degree of understanding via: Teach Back  Readiness Level: Action Barriers to learning/adherence to lifestyle change: comprehension   RD's Notes for Next Visit . Assess adherence to pt chosen goals   MONITORING & EVALUATION Dietary intake, weekly physical activity, body weight  Next Steps Patient is to follow-up in  3 months.

## 2020-05-29 ENCOUNTER — Ambulatory Visit: Payer: Medicare Other

## 2020-05-29 ENCOUNTER — Telehealth: Payer: Self-pay

## 2020-05-29 ENCOUNTER — Telehealth: Payer: Self-pay | Admitting: Skilled Nursing Facility1

## 2020-05-29 NOTE — Telephone Encounter (Signed)
Called pt in reference to his message sent to the front office; picture of multivitamin he is taking.     [email or web address]  Sent to his email

## 2020-05-29 NOTE — Telephone Encounter (Signed)
No show. Called patient who said that he is with his dad at the cancer center. Dad has stage 4 cancer. Pt forgot to call. Will try to make it to his next scheduled appointment. Had a bad weekend with pain at his pelvis (bone below the belly button) when trying to get into and out of bed.

## 2020-05-30 ENCOUNTER — Other Ambulatory Visit: Payer: Self-pay | Admitting: Cardiovascular Disease

## 2020-06-01 ENCOUNTER — Ambulatory Visit: Payer: Medicare Other

## 2020-06-03 ENCOUNTER — Other Ambulatory Visit: Payer: Self-pay | Admitting: Cardiovascular Disease

## 2020-06-05 ENCOUNTER — Ambulatory Visit: Payer: Medicare Other

## 2020-06-05 NOTE — Telephone Encounter (Signed)
Refill request

## 2020-06-08 ENCOUNTER — Ambulatory Visit: Payer: Medicare Other

## 2020-06-12 ENCOUNTER — Ambulatory Visit: Payer: Medicare Other

## 2020-06-13 DIAGNOSIS — M9903 Segmental and somatic dysfunction of lumbar region: Secondary | ICD-10-CM | POA: Diagnosis not present

## 2020-06-13 DIAGNOSIS — M5127 Other intervertebral disc displacement, lumbosacral region: Secondary | ICD-10-CM | POA: Diagnosis not present

## 2020-06-13 DIAGNOSIS — M62830 Muscle spasm of back: Secondary | ICD-10-CM | POA: Diagnosis not present

## 2020-06-13 DIAGNOSIS — M9904 Segmental and somatic dysfunction of sacral region: Secondary | ICD-10-CM | POA: Diagnosis not present

## 2020-06-14 ENCOUNTER — Ambulatory Visit: Payer: Medicare Other

## 2020-06-15 ENCOUNTER — Encounter: Payer: Self-pay | Admitting: Internal Medicine

## 2020-06-15 ENCOUNTER — Ambulatory Visit: Payer: Self-pay | Admitting: *Deleted

## 2020-06-15 ENCOUNTER — Other Ambulatory Visit: Payer: Self-pay

## 2020-06-15 ENCOUNTER — Ambulatory Visit (INDEPENDENT_AMBULATORY_CARE_PROVIDER_SITE_OTHER): Payer: Medicare Other | Admitting: Internal Medicine

## 2020-06-15 VITALS — BP 133/67 | HR 57 | Temp 97.7°F | Resp 18 | Ht 69.0 in | Wt 228.2 lb

## 2020-06-15 DIAGNOSIS — L237 Allergic contact dermatitis due to plants, except food: Secondary | ICD-10-CM

## 2020-06-15 MED ORDER — PANTOPRAZOLE SODIUM 40 MG PO TBEC: DELAYED_RELEASE_TABLET | Freq: Every day | ORAL | 0 refills | Status: DC

## 2020-06-15 MED ORDER — TRIAMCINOLONE ACETONIDE 0.1 % EX CREA: 1.0000 "application " | TOPICAL_CREAM | Freq: Two times a day (BID) | CUTANEOUS | 0 refills | Status: DC

## 2020-06-15 MED ORDER — METHYLPREDNISOLONE SODIUM SUCC 125 MG IJ SOLR
125.0000 mg | Freq: Once | INTRAMUSCULAR | Status: AC
  Administered 2020-06-15: 125 mg via INTRAMUSCULAR

## 2020-06-15 NOTE — Patient Instructions (Signed)
Poison Ivy Dermatitis Poison ivy dermatitis is redness and soreness of the skin caused by chemicals in the leaves of the poison ivy plant. You may have very bad itching, swelling, a rash, and blisters. What are the causes?  Touching a poison ivy plant.  Touching something that has the chemical on it. This may include animals or objects that have come in contact with the plant. What increases the risk?  Going outdoors often in wooded or [place] areas.  Going outdoors without wearing protective clothing, such as closed shoes, long pants, and a long-sleeved shirt. What are the signs or symptoms?  Skin redness.  Very bad itching.  A rash that often includes bumps and blisters. ? The rash usually appears 48 hours after exposure, if you have been exposed before. ? If this is the first time you have been exposed, the rash may not appear until a week after exposure.  Swelling. This may occur if the reaction is very bad. Symptoms usually last for 1-2 weeks. The first time you develop this condition, symptoms may last 3-4 weeks.   How is this treated? This condition may be treated with:  Hydrocortisone cream or calamine lotion to relieve itching.  Oatmeal baths to soothe the skin.  Medicines, such as over-the-counter antihistamine tablets.  Oral steroid medicine for more severe reactions. Follow these instructions at home: Medicines  Take or apply over-the-counter and prescription medicines only as told by your doctor.  Use hydrocortisone cream or calamine lotion as needed to help with itching. General instructions  Do not scratch or rub your skin.  Put a cold, wet cloth (cold compress) on the affected areas or take baths in cool water. This will help with itching.  Avoid hot baths and showers.  Take oatmeal baths as needed. Use colloidal oatmeal. You can get this at a pharmacy or grocery store. Follow the instructions on the package.  While you have the rash, wash your clothes  right after you wear them.  Keep all follow-up visits as told by your health care provider. This is important. How is this prevented?  Know what poison ivy looks like, so you can avoid it. ? This plant has three leaves with flowering branches on a single stem. ? The leaves are glossy. ? The leaves have uneven edges that come to a point at the front.  If you touch poison ivy, wash your skin with soap and water right away. Be sure to wash under your fingernails.  When hiking or camping, wear long pants, a long-sleeved shirt, tall socks, and hiking boots. You can also use a lotion on your skin that helps to prevent contact with poison ivy.  If you think that your clothes or outdoor gear came in contact with poison ivy, rinse them off with a garden hose before you bring them inside your house.  When doing yard work or gardening, wear gloves, long sleeves, long pants, and boots. Wash your garden tools and gloves if they come in contact with poison ivy.  If you think that your pet has come into contact with poison ivy, wash him or her with pet shampoo and water. Make sure to wear gloves while washing your pet.   Contact a doctor if:  You have open sores in the rash area.  You have more redness, swelling, or pain in the rash area.  You have redness that spreads beyond the rash area.  You have fluid, blood, or pus coming from the rash area.  You  have a fever.  You have a rash over a large area of your body.  You have a rash on your eyes, mouth, or genitals.  Your rash does not get better after a few weeks. Get help right away if:  Your face swells or your eyes swell shut.  You have trouble breathing.  You have trouble swallowing. These symptoms may be an emergency. Do not wait to see if the symptoms will go away. Get medical help right away. Call your local emergency services (911 in the [place] Do not drive yourself to the hospital. Summary  Poison ivy dermatitis is redness and  soreness of the skin caused by chemicals in the leaves of the poison ivy plant.  You may have skin redness, very bad itching, swelling, and a rash.  Do not scratch or rub your skin.  Take or apply over-the-counter and prescription medicines only as told by your doctor. This information is not intended to replace advice given to you by your health care provider. Make sure you discuss any questions you have with your health care provider. Document Revised: 05/01/2018 Document Reviewed: 01/02/2018 Elsevier Patient Education  2021 [employer].

## 2020-06-15 NOTE — Addendum Note (Signed)
Addended by: Wilson Singer on: 06/15/2020 03:41 PM   Modules accepted: Orders

## 2020-06-15 NOTE — Telephone Encounter (Signed)
Will discuss at upcoming appt.

## 2020-06-15 NOTE — Progress Notes (Signed)
Subjective:    Patient ID: Richard [name], male    DOB: 1960/09/16, 60 y.o.   MRN: [id number]  HPI  Pt presents to the clinic today with c/o a rash of his neck. He noticed this 4 days ago after mowing the yard. He thinks a tree limb rubbed up against him while he was mowing. He reports the rash itches and burns. It has not spread. He has not put anything on it OTC.  Review of Systems      Past Medical History:  Diagnosis Date  . Arthritis   . Arthrofibrosis of total knee arthroplasty ([place]) 05/30/2015  . Bilateral pulmonary embolism ([place])    a. 03/2015 CTA: acute bilat PE; b. IVC filter placed in 2017--> removed 2018.  Marland Kitchen Chest pain    a. 2015 Abnl MV;  b. 2015 Cath: nl cors.  . Chronic diastolic CHF (congestive heart failure) ([place])    a. 03/2015 Echo: EF 55-65%, poor windows, mildly dil RV.  Marland Kitchen DVT (deep venous thrombosis) ([place])    a. 03/2015 U/S: occlusive DVT w/in the distal aspect of the Left fem vein through the L popliteal vein.  Marland Kitchen Dyspnea    On exertion  . Dysrhythmia    afib   . GERD (gastroesophageal reflux disease)   . Headache   . Hypertension   . Hypertensive heart disease   . Obesity   . OSA (obstructive sleep apnea)    uses CPAP nightly settings at 15   . PAF (paroxysmal atrial fibrillation) ([place])   . Pneumonia    hx of years ago   . Primary localized osteoarthritis of left knee    a. 11/2014 s/p L TKA.  . Primary localized osteoarthritis of right knee    a. 02/2014 s/p R Partial Knee arthroplasty.  . Stiffness of left knee    a. 12/2014 s/p manipulation under anesthesia.  . Urine incontinence     Current Outpatient Medications  Medication Sig Dispense Refill  . baclofen (LIORESAL) 10 MG tablet TAKE 1 TO 2 TABLETS BY MOUTH 3 TIMES DAILY AS NEEDED FOR MUSCLE SPASMS 180 each 0  . cyanocobalamin (,VITAMIN B-12,) 1000 MCG/ML injection Inject 1 mL (1,000 mcg total) into the muscle every 30 (thirty) days. 3 mL 1  . ELIQUIS 5 MG TABS tablet TAKE 1 TABLET BY MOUTH  2 TIMES DAILY 60 tablet 6  . flecainide (TAMBOCOR) 100 MG tablet TAKE 1 TABLET BY MOUTH TWICE (2) DAILY 180 tablet 3  . furosemide (LASIX) 20 MG tablet Take 1 tablet (20 mg total) by mouth daily. (Patient not taking: Reported on 05/17/2020) 5 tablet 0  . Multiple Vitamins-Minerals (MULTIVITAMIN WITH MINERALS) tablet Take 1 tablet by mouth daily.    . ondansetron (ZOFRAN-ODT) 4 MG disintegrating tablet DISSOLVE 1 TABLET BY MOUTH EVERY 6 HOURS AS NEEDED FOR NAUSEA OR VOMITING (Patient not taking: Reported on 05/17/2020) 20 tablet 0  . pantoprazole (PROTONIX) 40 MG tablet Take 1 tablet (40 mg total) by mouth daily. 90 tablet 0  . pantoprazole (PROTONIX) 40 MG tablet TAKE 1 TABLET BY MOUTH DAILY (Patient not taking: Reported on 05/17/2020) 90 tablet 0  . Sennosides-Docusate Sodium (SM STOOL SOFTENER/LAXATIVE PO) Take 8.6 mg by mouth daily. (Patient not taking: Reported on 05/17/2020)    . traMADol (ULTRAM) 50 MG tablet Take 1-2 tablets (50-100 mg total) by mouth at bedtime as needed. 30 tablet 0   No current facility-administered medications for this visit.    Allergies  Allergen Reactions  .  Penicillins Anaphylaxis and Other (See Comments)    From childhood; uncertain of reaction Has patient had a PCN reaction causing immediate rash, facial/tongue/throat swelling, SOB or lightheadedness with hypotension: Unk Has patient had a PCN reaction causing severe rash involving mucus membranes or skin necrosis: Unk Has patient had a PCN reaction that required hospitalization: Unk Has patient had a PCN reaction occurring within the last 10 years: No If all of the above answers are "NO", then may proceed with Cephalosporin use.  . Vancomycin Other (See Comments)    Red Mans' syndrome  . Penicillins Other (See Comments)    Childhood  . Vancomycin Other (See Comments)    Red man Syndrome    Family History  Problem Relation Age of Onset  . Coronary artery disease Mother   . Hyperlipidemia Mother   .  Hypertension Mother   . Arthritis Mother   . Coronary artery disease Father   . Heart disease Paternal Grandmother   . Alzheimer's disease Paternal Grandfather     Social History   Socioeconomic History  . Marital status: Married    Spouse name: Not on file  . Number of children: 2  . Years of education: 45  . Highest education level: Not on file  Occupational History  . Occupation: Energy manager: [employer]  Tobacco Use  . Smoking status: Never Smoker  . Smokeless tobacco: Never Used  Vaping Use  . Vaping Use: Never used  Substance and Sexual Activity  . Alcohol use: Not Currently    Comment: occassional  . Drug use: No  . Sexual activity: Yes  Other Topics Concern  . Not on file  Social History Narrative   ** Merged History Encounter **       Social Determinants of Health   Financial Resource Strain: Not on file  Food Insecurity: Not on file  Transportation Needs: Not on file  Physical Activity: Not on file  Stress: Not on file  Social Connections: Not on file  Intimate Partner Violence: Not on file     Constitutional: Denies fever, malaise, fatigue, headache or abrupt weight changes.  Respiratory: Denies difficulty breathing, shortness of breath, cough or sputum production.   Cardiovascular: Denies chest pain, chest tightness, palpitations or swelling in the hands or feet.  Skin: Pt reports rash of neck. Denies ulcercations.   No other specific complaints in a complete review of systems (except as listed in HPI above).  Objective:   Physical Exam  BP 133/67 (BP Location: Right Arm, Patient Position: Sitting, Cuff Size: Large)   Pulse (!) 57   Temp 97.7 F (36.5 C) (Temporal)   Resp 18   Ht 5\' 9"  (1.753 m)   Wt 228 lb 3.2 oz (103.5 kg)   SpO2 99%   BMI 33.70 kg/m   Wt Readings from Last 3 Encounters:  05/25/20 225 lb (102.1 kg)  05/17/20 227 lb 9.6 oz (103.2 kg)  04/18/20 228 lb (103.4 kg)    General: Appears his stated age, obese, in  NAD. Skin: Linear vesicular lesions on erythematous base noted of left side of neck. HEENT: Head: normal shape and size; Eyes: sclera white and EOMs intact;  rdiovascular: Bradycardic. Pulmonary/Chest: Normal effort and positive vesicular breath sounds.   Musculoskeletal: No difficulty with gait.  Neurological: Alert and oriented.    BMET    Component Value Date/Time   NA 142 01/17/2020 1214   NA 141 10/31/2017 1438   K 4.3 01/17/2020 1214  CL 106 01/17/2020 1214   CO2 29 01/17/2020 1214   GLUCOSE 83 01/17/2020 1214   BUN 16 01/17/2020 1214   BUN 18 10/31/2017 1438   CREATININE 1.30 01/17/2020 1214   CREATININE 1.27 12/06/2015 1600   CALCIUM 9.5 01/17/2020 1214   GFRNONAA >60 12/26/2019 0123   GFRAA >60 10/19/2019 1337   GFRAA >60 10/19/2019 1337    Lipid Panel     Component Value Date/Time   CHOL 162 01/17/2020 1214   CHOL 170 10/31/2017 1438   TRIG 112.0 01/17/2020 1214   HDL 39.80 01/17/2020 1214   HDL 36 (L) 10/31/2017 1438   CHOLHDL 4 01/17/2020 1214   VLDL 22.4 01/17/2020 1214   LDLCALC 100 (H) 01/17/2020 1214   LDLCALC 108 (H) 10/31/2017 1438    CBC    Component Value Date/Time   WBC 7.5 01/17/2020 1214   RBC 4.92 01/17/2020 1214   HGB 15.1 01/17/2020 1214   HGB 14.0 10/31/2017 1438   HCT 44.8 01/17/2020 1214   HCT 43.5 10/31/2017 1438   PLT 178.0 01/17/2020 1214   PLT 220 10/31/2017 1438   MCV 90.9 01/17/2020 1214   MCV 83 10/31/2017 1438   MCH 30.4 12/26/2019 0123   MCHC 33.6 01/17/2020 1214   RDW 14.0 01/17/2020 1214   RDW 14.4 10/31/2017 1438   LYMPHSABS 1.2 10/27/2019 1133   LYMPHSABS 1.6 10/31/2017 1438   MONOABS 1.0 10/27/2019 1133   EOSABS 0.0 10/27/2019 1133   EOSABS 0.2 10/31/2017 1438   BASOSABS 0.0 10/27/2019 1133   BASOSABS 0.0 10/31/2017 1438    Hgb A1C Lab Results  Component Value Date   HGBA1C 4.9 01/17/2020          Assessment & Plan:  Poison Ivy:  SoluMedrol 125 mg IM x 1 RX for Triamcinolone cream 0.1% BID  prn  Return precautions discussed Webb Silversmith, NP This visit occurred during the SARS-CoV-2 public health emergency.  Safety protocols were in place, including screening questions prior to the visit, additional usage of staff PPE, and extensive cleaning of exam room while observing appropriate contact time as indicated for disinfecting solutions.

## 2020-06-15 NOTE — Telephone Encounter (Signed)
Pt called with complaints of a rash on his left neck; the area is the app 2.4 inches; he says it looks like a whelp and puffy; the area is itchy and red; he denies fever; he has applied neosporin to the site; the pt says he was mowing grass and a limb hit his neck; the rash has not spread to any other areas of his body; recommendations made per nurse triage protocol; decision tree completed; pt offered and accepted appt with Webb Silversmith, [place], 06/15/20 at 1540; will route to office for notification.  Reason for Disposition . [1] Looks infected (spreading redness, pus) AND [2] large red area (> 2 in. or 5 cm)  Answer Assessment - Initial Assessment Questions 1. APPEARANCE of RASH: "Describe the rash."      Red, raised 2. LOCATION: "Where is the rash located?"      Left neck 3. NUMBER: "How many spots are there?"  1 4. SIZE: "How big are the spots?" (Inches, centimeters or compare to size of a coin)      2.5 inches 5. ONSET: "When did the rash start?"  06/12/20 6. ITCHING: "Does the rash itch?" If Yes, ask: "How bad is the itch?"  (Scale 0-10; or none, mild, moderate, severe)  moderate; rated 6 out of 10 7. PAIN: "Does the rash hurt?" If Yes, ask: "How bad is the pain?"  (Scale 0-10; or none, mild, moderate, severe)    - NONE (0): no pain    - MILD (1-3): doesn't interfere with normal activities     - MODERATE (4-7): interferes with normal activities or awakens from sleep     - SEVERE (8-10): excruciating pain, unable to do any normal activities    denies 8. OTHER SYMPTOMS: "Do you have any other symptoms?" (e.g., fever)     no 9. PREGNANCY: "Is there any chance you are pregnant?" "When was your last menstrual period?"     n/a  Protocols used: RASH OR REDNESS - LOCALIZED-A-AH

## 2020-06-20 ENCOUNTER — Ambulatory Visit: Payer: Medicare Other

## 2020-06-22 ENCOUNTER — Ambulatory Visit: Payer: Medicare Other | Attending: Internal Medicine

## 2020-06-26 ENCOUNTER — Ambulatory Visit: Payer: Medicare Other

## 2020-06-28 ENCOUNTER — Other Ambulatory Visit: Payer: Self-pay | Admitting: Internal Medicine

## 2020-06-28 ENCOUNTER — Ambulatory Visit: Payer: Medicare Other

## 2020-06-28 NOTE — Telephone Encounter (Signed)
Requested medication (s) are due for refill today: yes  Requested medication (s) are on the active medication list:yes  Last refill: 03/28/20  #180  0 refills  Future visit scheduled no  Notes to clinic: not delegated  Requested Prescriptions  Pending Prescriptions Disp Refills   baclofen (LIORESAL) 10 MG tablet [Pharmacy Med Name: BACLOFEN 10 MG TAB] 180 each 0    Sig: TAKE 1 TO 2 TABLETS BY MOUTH 3 TIMES DAILY AS NEEDED FOR MUSCLE SPASMS      Not Delegated - Analgesics:  Muscle Relaxants Failed - 06/28/2020  3:23 PM      Failed - This refill cannot be delegated      Passed - Valid encounter within last 6 months    Recent Outpatient Visits           1 week ago [facility] [place], [place] W, NP   1 month ago Seasonal allergic rhinitis due to pollen   [facility] [place], Coralie Keens, NP

## 2020-07-03 ENCOUNTER — Other Ambulatory Visit: Payer: Self-pay

## 2020-07-03 ENCOUNTER — Ambulatory Visit (INDEPENDENT_AMBULATORY_CARE_PROVIDER_SITE_OTHER): Payer: Medicare Other

## 2020-07-03 DIAGNOSIS — E538 Deficiency of other specified B group vitamins: Secondary | ICD-10-CM | POA: Diagnosis not present

## 2020-07-03 MED ORDER — CYANOCOBALAMIN 1000 MCG/ML IJ SOLN
1000.0000 ug | Freq: Once | INTRAMUSCULAR | Status: AC
  Administered 2020-07-03: 1000 ug via INTRAMUSCULAR

## 2020-08-01 ENCOUNTER — Telehealth: Payer: Self-pay

## 2020-08-01 NOTE — Telephone Encounter (Signed)
Sheila Sagona Key: [id number] - PA Case ID: [id number] - Rx #: [id number] Prior Authorization sent for Eliquis 5MG  tablets  [employer] Medicare Part D Electronic Prior Authorization Form (2017 NCPDP) Per [email or web address] with [employer] this medication or product is on your plan's list of covered drugs. Prior authorization is not required at this time. If your pharmacy has questions regarding the processing of your prescription, please have them call the [employer] pharmacy help desk at [phone]. **Please note: This request was submitted electronically. Formulary lowering, tiering exception, cost reduction and/or pre-benefit determination review (including prospective Medicare hospice reviews) requests cannot be requested using this method of submission. Providers contact us at [phone] for further assistance.

## 2020-08-08 ENCOUNTER — Encounter (HOSPITAL_COMMUNITY): Payer: Self-pay | Admitting: *Deleted

## 2020-08-28 ENCOUNTER — Encounter: Payer: Medicare Other | Attending: Surgery | Admitting: Skilled Nursing Facility1

## 2020-08-28 ENCOUNTER — Other Ambulatory Visit: Payer: Self-pay

## 2020-08-28 DIAGNOSIS — E669 Obesity, unspecified: Secondary | ICD-10-CM | POA: Diagnosis present

## 2020-08-28 NOTE — Progress Notes (Signed)
Bariatric Nutrition Follow-Up Visit Medical Nutrition Therapy   Post-Operative sleeve Surgery Surgery Date: 10/26/2019  NUTRITION ASSESSMENT  Anthropometrics  Start weight at [facility]: 302.4 lbs  Today's weight: 219.6 lbs  Body Composition Scale Date 08/28/2020  Weight  lbs 253.8 219.6  Total Body Fat  % 33.1 28.2     Visceral Fat 26 19  Fat-Free Mass  % 66.8 71.7     Total Body Water  % 47.8 52.7     Muscle-Mass  lbs 47.8 41.3  BMI 37.2 32.2  Body Fat Displacement ---         Torso  lbs 52.1 38.4        Left Leg  lbs 10.4 7.6        Right Leg  lbs 10.4 7.6        Left Arm  lbs 5.2 3.8        Right Arm  lbs 5.2 3.8   Clinical  Medical hx: HTN, CHF. GERD, Renal Stone Medications: off HTN meds Labs:    Lifestyle & Dietary Hx  Pt states he knows he should not be drinking grape soda.  Pt states he has been taking his dad who has been diagnosed with cancer to his appts and taking care of him which his mom passed away of cancer in 2022/07/14.   Estimated daily fluid intake: 16.9 X 5-6 oz Estimated daily protein intake: 100+g Supplements: inappropriate bari multi and calcium and b12 shot (b12 6 months ago 633) Current average weekly physical activity: walk 1-2 miles on a usual day  24-Hr Dietary Recall First Meal 8:30-9: 1 or 2 egg + 1 sausage + tomatoes + cheese sometimes with cantaloupe  Snack: sometimes apple Second Meal: chicken salad + fruit or tuna fish Snack:  Third Meal 4:30-5pm: chicken or fish or pork + green beans sometimes beans + tomatoes + cucumbers  Snack:  Beverages: water and gatorade zero and diluted sweet tea, grape soda sometimes   Post-Op Goals/ Signs/ Symptoms Using straws: no Drinking while eating: no Chewing/swallowing difficulties: no Changes in vision: no Changes to mood/headaches: no Hair loss/changes to skin/nails: no Difficulty focusing/concentrating: no Sweating: no Dizziness/lightheadedness: no Palpitations: no Carbonated/caffeinated  beverages: no N/V/D/C/Gas: no Abdominal pain: no Dumping syndrome: no    NUTRITION DIAGNOSIS  Overweight/obesity ([place]-3.3) related to past poor dietary habits and physical inactivity as evidenced by completed bariatric surgery and following dietary guidelines for continued weight loss and healthy nutrition status.     NUTRITION INTERVENTION Nutrition counseling (C-1) and education (E-2) to facilitate bariatric surgery goals, including: The importance of consuming adequate calories as well as certain nutrients daily due to the body's need for essential vitamins, minerals, and fats The importance of daily physical activity and to reach a goal of at least 150 minutes of moderate to vigorous physical activity weekly (or as directed by their physician) due to benefits such as increased musculature and improved lab values The importance of intuitive eating specifically learning hunger-satiety cues and understanding the importance of learning a new body: The importance of mindful eating to avoid grazing behaviors   Goals:  Keep up the great work!!   Handouts Previously Provided Include  Phase 4 handout Phase 5 handout   Learning Style & Readiness for Change Teaching method utilized: Visual & Auditory  Demonstrated degree of understanding via: Teach Back  Readiness Level: Action Barriers to learning/adherence to lifestyle change: comprehension   RD's Notes for Next Visit Assess adherence to pt chosen goals  MONITORING & EVALUATION Dietary intake, weekly physical activity, body weight  Next Steps Patient is to follow-up in [place]

## 2020-08-29 ENCOUNTER — Telehealth: Payer: Self-pay

## 2020-08-29 NOTE — Telephone Encounter (Signed)
Patient would like to be scheduled for his next B-12 shot. Last shot was 07/03/2020 CB: [phone]

## 2020-08-29 NOTE — Telephone Encounter (Signed)
I called and advised the patient that we don't have prescriptions here in the office... that the pt picks them up from the pharmacy and brings them here to be administered. He didn't seem interested in calling them so we canceled. I told him that [facility] isn't here this week and to call back if he wants to next week for a refill.

## 2020-08-30 ENCOUNTER — Ambulatory Visit: Payer: Medicare Other

## 2020-08-31 NOTE — Progress Notes (Deleted)
[name], [place] [address]  Pulmonary Sleep Medicine   Office Visit Note  Patient Name: Richard [name] DOB: 02/10/1960 MRN [id number]    Chief Complaint: Obstructive Sleep Apnea visit  Brief History:  Richard [name] is seen today for *** The patient has a *** history of sleep apnea. Patient is usually using PAP nightly @ 15 cmH2O.  The patient feels *** after sleeping with PAP.  The patient reports *** from PAP use. Reported sleepiness is  *** and the Epworth Sleepiness Score is *** out of 24. The patient *** take naps. The patient complains of the following: ***  The compliance download shows  compliance with an average use time of 5:55 hours @ 82%. The AHI is 3.9  The patient *** of limb movements disrupting sleep.  ROS  General: (-) fever, (-) chills, (-) night sweat Nose and Sinuses: (-) nasal stuffiness or itchiness, (-) postnasal drip, (-) nosebleeds, (-) sinus trouble. Mouth and Throat: (-) sore throat, (-) hoarseness. Neck: (-) swollen glands, (-) enlarged thyroid, (-) neck pain. Respiratory: *** cough, *** shortness of breath, *** wheezing. Neurologic: *** numbness, *** tingling. Psychiatric: *** anxiety, *** depression   Current Medication: Outpatient Encounter Medications as of 09/04/2020  Medication Sig   baclofen (LIORESAL) 10 MG tablet TAKE 1 TO 2 TABLETS BY MOUTH 3 TIMES DAILY AS NEEDED FOR MUSCLE SPASMS   cyanocobalamin (,VITAMIN B-12,) 1000 MCG/ML injection Inject 1 mL (1,000 mcg total) into the muscle every 30 (thirty) days.   ELIQUIS 5 MG TABS tablet TAKE 1 TABLET BY MOUTH 2 TIMES DAILY   flecainide (TAMBOCOR) 100 MG tablet TAKE 1 TABLET BY MOUTH TWICE (2) DAILY   furosemide (LASIX) 20 MG tablet Take 1 tablet (20 mg total) by mouth daily.   Multiple Vitamins-Minerals (MULTIVITAMIN WITH MINERALS) tablet Take 1 tablet by mouth daily.   pantoprazole (PROTONIX) 40 MG tablet TAKE 1 TABLET BY MOUTH DAILY   Sennosides-Docusate  Sodium (SM STOOL SOFTENER/LAXATIVE PO) Take 8.6 mg by mouth daily.   traMADol (ULTRAM) 50 MG tablet Take 1-2 tablets (50-100 mg total) by mouth at bedtime as needed.   triamcinolone cream (KENALOG) 0.1 % Apply 1 application topically 2 (two) times daily.   No facility-administered encounter medications on file as of 09/04/2020.    Surgical History: Past Surgical History:  Procedure Laterality Date   APPENDECTOMY     CARDIAC CATHETERIZATION     CARPAL TUNNEL RELEASE Left 03/2018   CYSTOSCOPY W/ RETROGRADES  12/09/2019   Procedure: CYSTOSCOPY WITH RETROGRADE PYELOGRAM;  Surgeon: Royston Cowper, MD;  Location: [facility];  Service: Urology;;   CYSTOSCOPY WITH STENT PLACEMENT Right 12/09/2019   Procedure: CYSTOSCOPY WITH STENT PLACEMENT;  Surgeon: Royston Cowper, MD;  Location: [facility];  Service: Urology;  Laterality: Right;   EXAM UNDER ANESTHESIA WITH MANIPULATION OF KNEE Left 05/30/2015   Procedure: EXAM UNDER ANESTHESIA WITH MANIPULATION OF LEFT KNEE;  Surgeon: Marchia Bond, MD;  Location: [place];  Service: Orthopedics;  Laterality: Left;   HIATAL HERNIA REPAIR N/A 10/26/2019   Procedure: HIATAL HERNIA REPAIR;  Surgeon: Johnathan Hausen, MD;  Location: [facility];  Service: General;  Laterality: N/A;   I & D KNEE WITH POLY EXCHANGE Left 10/30/2015   Procedure: IRRIGATION AND DEBRIDEMENT KNEE WITH POLY EXCHANGE PLACE SPACERS;  Surgeon: Frederik Pear, MD;  Location: [place];  Service: Orthopedics;  Laterality: Left;  OFF ELIQUIST 3 DAYS   IVC FILTER PLACEMENT ([facility] HX)  04/13/15  IVC FILTER REMOVAL N/A 08/13/2016   Procedure: IVC Filter Removal;  Surgeon: Katha Cabal, MD;  Location: [place] [facility];  Service: Cardiovascular;  Laterality: N/A;   JOINT REPLACEMENT     KNEE ARTHROSCOPY Left 08/08/2015   Procedure: LEFT KNEE MANIPULATION WITH ARTHROSCOPIC LYSIS OF ADHESIONS AND ASPIRATION;  Surgeon: Marchia Bond, MD;  Location: [place];  Service: Orthopedics;  Laterality: Left;   KNEE CLOSED  REDUCTION Left 01/20/2015   Procedure: LEFT KNEE MANIPULATION;  Surgeon: Marchia Bond, MD;  Location: [place];  Service: Orthopedics;  Laterality: Left;   LAPAROSCOPIC GASTRIC SLEEVE RESECTION N/A 10/26/2019   Procedure: LAPAROSCOPIC SLEEVE GASTRECTOMY;  Surgeon: Johnathan Hausen, MD;  Location: [facility];  Service: General;  Laterality: N/A;   LEFT HEART CATHETERIZATION WITH CORONARY ANGIOGRAM N/A 01/12/2014   Procedure: LEFT HEART CATHETERIZATION WITH CORONARY ANGIOGRAM;  Surgeon: Jettie Booze, MD;  Location: [facility];  Service: Cardiovascular;  Laterality: N/A;   ORTHOPEDIC SURGERY Left    arthroscopy x3   PARTIAL KNEE ARTHROPLASTY Right 02/25/2014   Procedure: RIGHT KNEE ARTHROPLASTY CONDYLE AND PLATEAU MEDIAL COMPARTMENT ;  Surgeon: Johnny Bridge, MD;  Location: [place];  Service: Orthopedics;  Laterality: Right;   PERIPHERAL VASCULAR CATHETERIZATION N/A 03/29/2015   Procedure: IVC Filter Insertion, and possible thrombectomy;  Surgeon: Katha Cabal, MD;  Location: [place] [facility];  Service: Cardiovascular;  Laterality: N/A;   ROTATOR CUFF REPAIR Left    TOTAL KNEE ARTHROPLASTY  12/06/2014   Procedure: TOTAL KNEE ARTHROPLASTY;  Surgeon: Marchia Bond, MD;  Location: [place];  Service: Orthopedics;;   TOTAL KNEE REVISION Left 02/08/2016   Procedure: TOTAL KNEE REVISION;  Surgeon: Frederik Pear, MD;  Location: [place];  Service: Orthopedics;  Laterality: Left;   UPPER GI ENDOSCOPY N/A 10/26/2019   Procedure: UPPER GI ENDOSCOPY;  Surgeon: Johnathan Hausen, MD;  Location: [facility];  Service: General;  Laterality: N/A;   URETEROSCOPY WITH HOLMIUM LASER LITHOTRIPSY Right 12/30/2019   Procedure: URETEROSCOPY WITH HOLMIUM LASER LITHOTRIPSY;  Surgeon: Royston Cowper, MD;  Location: [facility];  Service: Urology;  Laterality: Right;    Medical History: Past Medical History:  Diagnosis Date   Arthritis    Arthrofibrosis of total knee arthroplasty ([place])  05/30/2015   Bilateral pulmonary embolism ([place])    a. 03/2015 CTA: acute bilat PE; b. IVC filter placed in 2017--> removed 2018.   Chest pain    a. 2015 Abnl MV;  b. 2015 Cath: nl cors.   Chronic diastolic CHF (congestive heart failure) ([place])    a. 03/2015 Echo: EF 55-65%, poor windows, mildly dil RV.   DVT (deep venous thrombosis) ([place])    a. 03/2015 U/S: occlusive DVT w/in the distal aspect of the Left fem vein through the L popliteal vein.   Dyspnea    On exertion   Dysrhythmia    afib    GERD (gastroesophageal reflux disease)    Headache    Hypertension    Hypertensive heart disease    Obesity    OSA (obstructive sleep apnea)    uses CPAP nightly settings at 15    PAF (paroxysmal atrial fibrillation) ([place])    Pneumonia    hx of years ago    Primary localized osteoarthritis of left knee    a. 11/2014 s/p L TKA.   Primary localized osteoarthritis of right knee    a. 02/2014 s/p R Partial Knee arthroplasty.   Stiffness of left knee  a. 12/2014 s/p manipulation under anesthesia.   Urine incontinence     Family History: Non contributory to the present illness  Social History: Social History   Socioeconomic History   Marital status: Married    Spouse name: Not on file   Number of children: 2   Years of education: 12   Highest education level: Not on file  Occupational History   Occupation: Energy manager: [employer]  Tobacco Use   Smoking status: Never   Smokeless tobacco: Never  Vaping Use   Vaping Use: Never used  Substance and Sexual Activity   Alcohol use: Not Currently    Comment: \   Drug use: No   Sexual activity: Yes  Other Topics Concern   Not on file  Social History Narrative   ** Merged History Encounter **       Social Determinants of Health   Financial Resource Strain: Not on file  Food Insecurity: Not on file  Transportation Needs: Not on file  Physical Activity: Not on file  Stress: Not on file  Social Connections: Not on file   Intimate Partner Violence: Not on file    Vital Signs: There were no vitals taken for this visit.  Examination: General Appearance: The patient is well-developed, well-nourished, and in no distress. Neck Circumference: *** Skin: Gross inspection of skin unremarkable. Head: normocephalic, no gross deformities. Eyes: no gross deformities noted. ENT: ears appear grossly normal Neurologic: Alert and oriented. No involuntary movements.    EPWORTH SLEEPINESS SCALE:  Scale:  (0)= no chance of dozing; (1)= slight chance of dozing; (2)= moderate chance of dozing; (3)= high chance of dozing  Chance  Situtation    Sitting and reading: ***    Watching TV: ***    Sitting Inactive in public: ***    As a passenger in car: ***      Lying down to rest: ***    Sitting and talking: ***    Sitting quielty after lunch: ***    In a car, stopped in traffic: ***   TOTAL SCORE:   *** out of 24    SLEEP STUDIES:  ***   CPAP COMPLIANCE DATA:  Date Range: 09/01/19 - 08/30/20   Average Daily Use: 5:55 hours  Median Use: 6:07 hours  Compliance for > 4 Hours: 82% days  AHI: 3.9 respiratory events per hour  Days Used: 358/365   Mask Leak: 33.6 lpm  95th Percentile Pressure: 15 cmH2O         LABS: No results found for this or any previous visit (from the past 2160 hour(s)).  Radiology: CT Head Wo Contrast  Result Date: 02/09/2020 CLINICAL DATA:  Headache.  Fall on Eliquis EXAM: CT HEAD WITHOUT CONTRAST TECHNIQUE: Contiguous axial images were obtained from the base of the skull through the vertex without intravenous contrast. COMPARISON:  11/26/2018 FINDINGS: Brain: No evidence of acute infarction, hemorrhage, hydrocephalus, extra-axial collection or mass lesion/mass effect. Vascular: No hyperdense vessel or unexpected calcification. Skull: Normal. Negative for fracture or focal lesion. Sinuses/Orbits: No acute finding. Other: None. IMPRESSION: No acute intracranial  findings. Electronically Signed   By: Davina Poke D.O.   On: 02/09/2020 10:48    No results found.  No results found.    Assessment and Plan: Patient Active Problem List   Diagnosis Date Noted   B12 deficiency 10/21/2019   OA (osteoarthritis) 06/23/2018   History of pulmonary embolus (PE) 06/23/2018   CHF (congestive heart failure), NYHA class  II, chronic, diastolic (HCC) 123XX123   PAF (paroxysmal atrial fibrillation) ([place]) 03/30/2015   Peroneal DVT (deep venous thrombosis) (HCC)    Hypertension 08/17/2011   GERD (gastroesophageal reflux disease) 08/17/2011   OSA (obstructive sleep apnea) 08/16/2011      The patient *** tolerate PAP and reports *** benefit from PAP use. The patient was reminded how to *** and advised to ***. The patient was also counselled on ***. The compliance is ***. The AHI is ***.   ***  General Counseling: I have discussed the findings of the evaluation and examination with Richard [name].  I have also discussed any further diagnostic evaluation thatmay be needed or ordered today. Richard [name] verbalizes understanding of the findings of todays visit. We also reviewed his medications today and discussed drug interactions and side effects including but not limited excessive drowsiness and altered mental states. We also discussed that there is always a risk not just to him but also people around him. he has been encouraged to call the office with any questions or concerns that should arise related to todays visit.  No orders of the defined types were placed in this encounter.       I have personally obtained a history, examined the patient, evaluated laboratory and imaging results, formulated the assessment and plan and placed orders.   Richelle Ito Saunders Glance, PhD, FAASM  Diplomate, American Board of Sleep Medicine    Allyne Gee, MD [facility] Diplomate ABMS Pulmonary and Critical Care Medicine Sleep medicine

## 2020-09-04 ENCOUNTER — Other Ambulatory Visit: Payer: Self-pay

## 2020-09-04 ENCOUNTER — Encounter: Payer: Self-pay | Admitting: Emergency Medicine

## 2020-09-04 ENCOUNTER — Telehealth: Payer: Self-pay

## 2020-09-04 ENCOUNTER — Ambulatory Visit: Payer: No Typology Code available for payment source

## 2020-09-04 ENCOUNTER — Ambulatory Visit: Payer: Self-pay | Admitting: *Deleted

## 2020-09-04 ENCOUNTER — Ambulatory Visit
Admission: EM | Admit: 2020-09-04 | Discharge: 2020-09-04 | Disposition: A | Discharge status: Home / Self Care | Payer: Medicare Other | Attending: Emergency Medicine | Admitting: Emergency Medicine

## 2020-09-04 DIAGNOSIS — M109 Gout, unspecified: Secondary | ICD-10-CM | POA: Diagnosis not present

## 2020-09-04 DIAGNOSIS — B880 Other acariasis: Secondary | ICD-10-CM

## 2020-09-04 MED ORDER — PREDNISONE 10 MG PO TABS: ORAL_TABLET | ORAL | 0 refills | Status: DC

## 2020-09-04 NOTE — Telephone Encounter (Signed)
Patient called needing to cancel sleep appointment for today. Gave him tele [phone] for [facility]

## 2020-09-04 NOTE — Telephone Encounter (Signed)
Reason for Disposition . [1] Swollen joint AND [2] no fever or redness  Answer Assessment - Initial Assessment Questions 1. ONSET: "When did the pain start?"      Right ankle start aching in the joint.  I can put a little weight on it but not much.  Pain started a couple of days ago.   It's a little swollen.  I'm putting ice on it but it's not helping.     I have a rash on my legs, back and butt and one of my shoulders.   I went to a cook out this weekend.   I felt like I was bitten by something.   I have spots everywhere.   2. LOCATION: "Where is the pain located?"      Right ankle 3. PAIN: "How bad is the pain?"    (Scale 1-10; or mild, moderate, severe)  - MILD (1-3): doesn't interfere with normal activities.   - MODERATE (4-7): interferes with normal activities (e.g., work or school) or awakens from sleep, limping.   - SEVERE (8-10): excruciating pain, unable to do any normal activities, unable to walk.      9 on pain scale 4. WORK OR EXERCISE: "Has there been any recent work or exercise that involved this part of the body?"      No 5. CAUSE: "What do you think is causing the ankle pain?"     Don't know 6. OTHER SYMPTOMS: "Do you have any other symptoms?" (e.g., calf pain, rash, fever, swelling)     Rash various places on my body too from a cookout 7. PREGNANCY: "Is there any chance you are pregnant?" "When was your last menstrual period?"     N/A  Protocols used: Ankle Pain-A-AH

## 2020-09-04 NOTE — ED Triage Notes (Signed)
Pt here with multiple insect bites over entire body from a cookout this weekend, but also c/o severe right ankle pain and swelling without mechanism of injury. Warm to touch. Unable to bare much weight.

## 2020-09-04 NOTE — Discharge Instructions (Addendum)
Gout is a form of arthritis caused by a buildup of uric acid crystals in a joint. It causes sudden attacks of pain, swelling, redness, and stiffness, usually in one joint, especially the big toe.  Gout usually comes on without a cause. But it can be brought on by drinking alcohol (especially beer) or eating seafood and red meat. Taking certain medicines, such as diuretics or aspirin, also can bring on an attack of gout.  Taking your medicines as prescribed and following up with your PCP regularly can help you avoid gout attacks in the future.  If the joint is swollen, put ice or a cold pack on the area for 10 to 20 minutes at a time. Put a thin cloth between the ice and your skin. Prop up the sore limb on a pillow when you ice it or anytime you sit or lie down during the next 3 days. Try to keep it above the level of your heart. This will help reduce swelling. Rest sore joints. Avoid activities that put weight or strain on the joints for a few days. Take short rest breaks from your regular activities during the day. Take your medicines exactly as prescribed (prednisone). Call your doctor if you think you are having a problem with your medicine. Eat less seafood and red meat. Check with your doctor before drinking alcohol. Losing weight, if you are overweight, may help reduce attacks of gout. But do not go on a "crash diet." Losing a lot of weight in a short amount of time can cause a gout attack.  Go to the ER for fever, swelling in multiple joints, or significant worsening of pain.     Increase fluid intake. You may take Benadryl 25-'50mg'$  every 4-6 hours as needed OR Zyrtec '10mg'$  daily for itching/inflammation. You may also take over the counter Famotidine '20mg'$  once daily to help with itching/inflammation. You may apply ice wrapped in a towel to affected area 3-5 times daily for 10-15 minute intervals. See your PCP if rash does not improve in 5-6 days. See your PCP or return to clinic sooner if  rash worsens or you develop fever.

## 2020-09-04 NOTE — ED Provider Notes (Addendum)
Chief Complaint   Chief Complaint  Patient presents with   Rash   Ankle Pain     Subjective, HPI  Richard [name] is a 60 y.o. male who presents with right ankle pain and rash.  Patient states that he had a family reunion this weekend and states that he has had insect bites that he has noticed all over his entire body.  Patient reports severe right ankle pain and swelling with no injury.  Patient reports that the area is warm to the touch and he is unable to bear much weight on the right ankle.  Patient endorses discomfort to the medial aspect of the right ankle.  He does not report any known history of gout.  He does not report any chest pain, shortness of breath or syncopal episodes.  He also does not report any fever.  History obtained from patient.  Patient's problem list, past medical and social history, medications, and allergies were reviewed by me and updated in Epic.    ROS  See HPI.  Objective   Vitals:   09/04/20 1100  BP: 133/85  Pulse: 85  Resp: 20  Temp: 98.1 F (36.7 C)  SpO2: 99%    Vital signs and nursing note reviewed.   General: Appears well-developed and well-nourished. No acute distress.  Head: Normocephalic and atraumatic.   Neck: Normal range of motion, neck is supple.  Cardiovascular: Normal rate. Pulm/Chest: No respiratory distress.  Musculoskeletal: R ankle: Severe TTP with moderate swelling noted to medial aspect of right ankle with erythema to joint.  5/5 strength, full sensation, 2+  pulses, < 2 sec cap refill.  Neurological: Alert and oriented to person, place, and time.  Skin: Skin is warm and dry.  Multiple insect bites noted over legs and arms.  No drainage. Psychiatric: Normal mood, affect, behavior, and thought content.    Data  No results found for any visits on 09/04/20.   Imaging None needed.  No trauma or falls precipitating current symptoms.  Assessment & Plan  1. Acute gout of right ankle, unspecified cause - predniSONE  (DELTASONE) 10 MG tablet; Take 6 tablets (60 mg total) by mouth daily for 1 day, THEN 5 tablets (50 mg total) daily for 1 day, THEN 4 tablets (40 mg total) daily for 1 day, THEN 3 tablets (30 mg total) daily for 1 day, THEN 2 tablets (20 mg total) daily for 1 day, THEN 1 tablet (10 mg total) daily for 1 day.  Dispense: 21 tablet; Refill: 0  2. Chigger bites - predniSONE (DELTASONE) 10 MG tablet; Take 6 tablets (60 mg total) by mouth daily for 1 day, THEN 5 tablets (50 mg total) daily for 1 day, THEN 4 tablets (40 mg total) daily for 1 day, THEN 3 tablets (30 mg total) daily for 1 day, THEN 2 tablets (20 mg total) daily for 1 day, THEN 1 tablet (10 mg total) daily for 1 day.  Dispense: 21 tablet; Refill: 0  60 y.o. male presents with right ankle pain and rash.  Patient states that he had a family reunion this weekend and states that he has had insect bites that he has noticed all over his entire body.  Patient reports severe right ankle pain and swelling with no injury.  Patient reports that the area is warm to the touch and he is unable to bear much weight on the right ankle.  Patient endorses discomfort to the medial aspect of the right ankle.  He does not  report any known history of gout.  He does not report any chest pain, shortness of breath or syncopal episodes.  He also does not report any fever.  Chart review completed.  Given symptoms along with assessment findings, likely multiple chigger bites without S/S of infection.  Right ankle appearance consistent with gout.  Rx prednisone to the patient's preferred pharmacy and advised about home treatment to include rest, ice, elevation.  Patient states he does have an appointment scheduled with his PCP on Thursday for which she will follow-up after treatment.  Return as needed.  Stable on discharge.  Patient verbalized understanding and agreed with plan.  Plan:   Discharge Instructions      Gout is a form of arthritis caused by a buildup of uric acid  crystals in a joint. It causes sudden attacks of pain, swelling, redness, and stiffness, usually in one joint, especially the big toe.  Gout usually comes on without a cause. But it can be brought on by drinking alcohol (especially beer) or eating seafood and red meat. Taking certain medicines, such as diuretics or aspirin, also can bring on an attack of gout.  Taking your medicines as prescribed and following up with your PCP regularly can help you avoid gout attacks in the future.  If the joint is swollen, put ice or a cold pack on the area for 10 to 20 minutes at a time. Put a thin cloth between the ice and your skin. Prop up the sore limb on a pillow when you ice it or anytime you sit or lie down during the next 3 days. Try to keep it above the level of your heart. This will help reduce swelling. Rest sore joints. Avoid activities that put weight or strain on the joints for a few days. Take short rest breaks from your regular activities during the day. Take your medicines exactly as prescribed (prednisone). Call your doctor if you think you are having a problem with your medicine. Eat less seafood and red meat. Check with your doctor before drinking alcohol. Losing weight, if you are overweight, may help reduce attacks of gout. But do not go on a "crash diet." Losing a lot of weight in a short amount of time can cause a gout attack.  Go to the ER for fever, swelling in multiple joints, or significant worsening of pain.     Increase fluid intake. You may take Benadryl 25-'50mg'$  every 4-6 hours as needed OR Zyrtec '10mg'$  daily for itching/inflammation. You may also take over the counter Famotidine '20mg'$  once daily to help with itching/inflammation. You may apply ice wrapped in a towel to affected area 3-5 times daily for 10-15 minute intervals. See your PCP if rash does not improve in 5-6 days. See your PCP or return to clinic sooner if rash worsens or you develop fever.          Serafina Royals, [place] 09/04/20 [place], [place], [place] 09/04/20 1131

## 2020-09-04 NOTE — Telephone Encounter (Signed)
There is an opening tomorrow at 340

## 2020-09-04 NOTE — Telephone Encounter (Signed)
Pt called in c/o right ankle pain that started a couple of days ago.   No injuries, falls, or accidents.   "I guess it's just wear and tear".   "I can't hardly walk on it".  Mildly swollen.  He went to a cookout this past weekend and felt like something was biting him.   "I thought it was sand flies or mosquitos but now I have a rash on my legs, back, shoulder.   It itches too.   I'm taking Benadryl and using Benadryl lotion but it doesn't seem to be helping.  I made him an appt in office with Webb Silversmith, NP on 09/07/2020 at 11:00.   I put him on the wait list if case something opened up sooner.  I sent my notes to [facility].

## 2020-09-04 NOTE — ED Notes (Signed)
Pt in restroom stating he felt claustrophobic in the exam room and he feels ill now.

## 2020-09-06 ENCOUNTER — Other Ambulatory Visit: Payer: Self-pay

## 2020-09-06 ENCOUNTER — Ambulatory Visit (INDEPENDENT_AMBULATORY_CARE_PROVIDER_SITE_OTHER): Payer: No Typology Code available for payment source | Admitting: Internal Medicine

## 2020-09-06 VITALS — BP 134/81 | HR 60 | Temp 97.9°F | Resp 18 | Ht 69.0 in | Wt 219.0 lb

## 2020-09-06 DIAGNOSIS — I5032 Chronic diastolic (congestive) heart failure: Secondary | ICD-10-CM

## 2020-09-06 DIAGNOSIS — G4733 Obstructive sleep apnea (adult) (pediatric): Secondary | ICD-10-CM

## 2020-09-06 DIAGNOSIS — Z7189 Other specified counseling: Secondary | ICD-10-CM | POA: Diagnosis not present

## 2020-09-06 DIAGNOSIS — E669 Obesity, unspecified: Secondary | ICD-10-CM

## 2020-09-06 DIAGNOSIS — I480 Paroxysmal atrial fibrillation: Secondary | ICD-10-CM | POA: Diagnosis not present

## 2020-09-06 NOTE — Progress Notes (Signed)
[facility] [place], [place] [address]  Pulmonary Sleep Medicine   Office Visit Note  Patient Name: Richard [name] DOB: 1960-08-06 MRN [id number]    Chief Complaint: Obstructive Sleep Apnea visit  Brief History:  Richard [name] is seen today for follow up on CPAP'@15cmH20'$  for new machine.  The patient has a 8 year  history of sleep apnea. Patient is using PAP nightly.  The patient feels rested after sleeping with PAP.  The patient reports benefiting from PAP use. Reported sleepiness is  improved and the Epworth Sleepiness Score is 16 out of 24. Patient reports excessive sleepiness during the day. Patient states he cant sleep without machine.The patient doesn't  take naps. The patient complains of the following: dry mouth so shown how to adjust humidity settings. He is also in need of a new machine. The compliance download shows  82% compliance with an average use time of 5.9 hours. The AHI is 4.0  The patient does not complain of limb movements disrupting sleep.  ROS  General: (-) fever, (-) chills, (-) night sweat Nose and Sinuses: (-) nasal stuffiness or itchiness, (-) postnasal drip, (-) nosebleeds, (-) sinus trouble. Mouth and Throat: (-) sore throat, (-) hoarseness. Neck: (-) swollen glands, (-) enlarged thyroid, (-) neck pain. Respiratory: - cough, - shortness of breath, - wheezing. Neurologic: - numbness, - tingling. Psychiatric: - anxiety, - depression   Current Medication: Outpatient Encounter Medications as of 09/06/2020  Medication Sig   baclofen (LIORESAL) 10 MG tablet TAKE 1 TO 2 TABLETS BY MOUTH 3 TIMES DAILY AS NEEDED FOR MUSCLE SPASMS   cyanocobalamin (,VITAMIN B-12,) 1000 MCG/ML injection Inject 1 mL (1,000 mcg total) into the muscle every 30 (thirty) days.   ELIQUIS 5 MG TABS tablet TAKE 1 TABLET BY MOUTH 2 TIMES DAILY   flecainide (TAMBOCOR) 100 MG tablet TAKE 1 TABLET BY MOUTH TWICE (2) DAILY   furosemide (LASIX) 20 MG tablet Take 1 tablet  (20 mg total) by mouth daily.   Multiple Vitamins-Minerals (MULTIVITAMIN WITH MINERALS) tablet Take 1 tablet by mouth daily.   pantoprazole (PROTONIX) 40 MG tablet TAKE 1 TABLET BY MOUTH DAILY   predniSONE (DELTASONE) 10 MG tablet Take 6 tablets (60 mg total) by mouth daily for 1 day, THEN 5 tablets (50 mg total) daily for 1 day, THEN 4 tablets (40 mg total) daily for 1 day, THEN 3 tablets (30 mg total) daily for 1 day, THEN 2 tablets (20 mg total) daily for 1 day, THEN 1 tablet (10 mg total) daily for 1 day.   Sennosides-Docusate Sodium (SM STOOL SOFTENER/LAXATIVE PO) Take 8.6 mg by mouth daily.   traMADol (ULTRAM) 50 MG tablet Take 1-2 tablets (50-100 mg total) by mouth at bedtime as needed.   triamcinolone cream (KENALOG) 0.1 % Apply 1 application topically 2 (two) times daily.   No facility-administered encounter medications on file as of 09/06/2020.    Surgical History: Past Surgical History:  Procedure Laterality Date   APPENDECTOMY     CARDIAC CATHETERIZATION     CARPAL TUNNEL RELEASE Left 03/2018   CYSTOSCOPY W/ RETROGRADES  12/09/2019   Procedure: CYSTOSCOPY WITH RETROGRADE PYELOGRAM;  Surgeon: Royston Cowper, MD;  Location: [facility];  Service: Urology;;   CYSTOSCOPY WITH STENT PLACEMENT Right 12/09/2019   Procedure: CYSTOSCOPY WITH STENT PLACEMENT;  Surgeon: Royston Cowper, MD;  Location: [facility];  Service: Urology;  Laterality: Right;   EXAM UNDER ANESTHESIA WITH MANIPULATION OF KNEE Left 05/30/2015   Procedure:  EXAM UNDER ANESTHESIA WITH MANIPULATION OF LEFT KNEE;  Surgeon: Marchia Bond, MD;  Location: [facility];  Service: Orthopedics;  Laterality: Left;   HIATAL HERNIA REPAIR N/A 10/26/2019   Procedure: HIATAL HERNIA REPAIR;  Surgeon: Johnathan Hausen, MD;  Location: [facility];  Service: General;  Laterality: N/A;   I & D KNEE WITH POLY EXCHANGE Left 10/30/2015   Procedure: IRRIGATION AND DEBRIDEMENT KNEE WITH POLY EXCHANGE PLACE SPACERS;  Surgeon: Frederik Pear, MD;  Location: [facility];   Service: Orthopedics;  Laterality: Left;  OFF ELIQUIST 3 DAYS   IVC FILTER PLACEMENT ([facility] HX)  04/13/15   IVC FILTER REMOVAL N/A 08/13/2016   Procedure: IVC Filter Removal;  Surgeon: Katha Cabal, MD;  Location: [facility];  Service: Cardiovascular;  Laterality: N/A;   JOINT REPLACEMENT     KNEE ARTHROSCOPY Left 08/08/2015   Procedure: LEFT KNEE MANIPULATION WITH ARTHROSCOPIC LYSIS OF ADHESIONS AND ASPIRATION;  Surgeon: Marchia Bond, MD;  Location: [facility];  Service: Orthopedics;  Laterality: Left;   KNEE CLOSED REDUCTION Left 01/20/2015   Procedure: LEFT KNEE MANIPULATION;  Surgeon: Marchia Bond, MD;  Location: [facility];  Service: Orthopedics;  Laterality: Left;   LAPAROSCOPIC GASTRIC SLEEVE RESECTION N/A 10/26/2019   Procedure: LAPAROSCOPIC SLEEVE GASTRECTOMY;  Surgeon: Johnathan Hausen, MD;  Location: [facility];  Service: General;  Laterality: N/A;   LEFT HEART CATHETERIZATION WITH CORONARY ANGIOGRAM N/A 01/12/2014   Procedure: LEFT HEART CATHETERIZATION WITH CORONARY ANGIOGRAM;  Surgeon: Jettie Booze, MD;  Location: [facility];  Service: Cardiovascular;  Laterality: N/A;   ORTHOPEDIC SURGERY Left    arthroscopy x3   PARTIAL KNEE ARTHROPLASTY Right 02/25/2014   Procedure: RIGHT KNEE ARTHROPLASTY CONDYLE AND PLATEAU MEDIAL COMPARTMENT ;  Surgeon: Johnny Bridge, MD;  Location: [facility];  Service: Orthopedics;  Laterality: Right;   PERIPHERAL VASCULAR CATHETERIZATION N/A 03/29/2015   Procedure: IVC Filter Insertion, and possible thrombectomy;  Surgeon: Katha Cabal, MD;  Location: [facility];  Service: Cardiovascular;  Laterality: N/A;   ROTATOR CUFF REPAIR Left    TOTAL KNEE ARTHROPLASTY  12/06/2014   Procedure: TOTAL KNEE ARTHROPLASTY;  Surgeon: Marchia Bond, MD;  Location: [facility];  Service: Orthopedics;;   TOTAL KNEE REVISION Left 02/08/2016   Procedure: TOTAL KNEE REVISION;  Surgeon: Frederik Pear, MD;  Location: [facility];  Service:  Orthopedics;  Laterality: Left;   UPPER GI ENDOSCOPY N/A 10/26/2019   Procedure: UPPER GI ENDOSCOPY;  Surgeon: Johnathan Hausen, MD;  Location[facility];  Service: General;  Laterality: N/A;   URETEROSCOPY WITH HOLMIUM LASER LITHOTRIPSY Right 12/30/2019   Procedure: URETEROSCOPY WITH HOLMIUM LASER LITHOTRIPSY;  Surgeon: Royston Cowper, MD;  Location: [facility];  Service: Urology;  Laterality: Right;    Medical History: Past Medical History:  Diagnosis Date   Arthritis    Arthrofibrosis of total knee arthroplasty ([place]) 05/30/2015   Bilateral pulmonary embolism ([place])    a. 03/2015 CTA: acute bilat PE; b. IVC filter placed in 2017--> removed 2018.   Chest pain    a. 2015 Abnl MV;  b. 2015 Cath: nl cors.   Chronic diastolic CHF (congestive heart failure) ([place])    a. 03/2015 Echo: EF 55-65%, poor windows, mildly dil RV.   DVT (deep venous thrombosis) ([place])    a. 03/2015 U/S: occlusive DVT w/in the distal aspect of the Left fem vein through the L popliteal vein.   Dyspnea    On exertion   Dysrhythmia  afib    GERD (gastroesophageal reflux disease)    Headache    Hypertension    Hypertensive heart disease    Obesity    OSA (obstructive sleep apnea)    uses CPAP nightly settings at 15    PAF (paroxysmal atrial fibrillation) ([place])    Pneumonia    hx of years ago    Primary localized osteoarthritis of left knee    a. 11/2014 s/p L TKA.   Primary localized osteoarthritis of right knee    a. 02/2014 s/p R Partial Knee arthroplasty.   Stiffness of left knee    a. 12/2014 s/p manipulation under anesthesia.   Urine incontinence     Family History: Non contributory to the present illness  Social History: Social History   Socioeconomic History   Marital status: Married    Spouse name: Not on file   Number of children: 2   Years of education: 12   Highest education level: Not on file  Occupational History   Occupation: Energy manager: [employer]  Tobacco Use   Smoking status:  Never   Smokeless tobacco: Never  Vaping Use   Vaping Use: Never used  Substance and Sexual Activity   Alcohol use: Not Currently    Comment: \   Drug use: No   Sexual activity: Yes  Other Topics Concern   Not on file  Social History Narrative   ** Merged History Encounter **       Social Determinants of Health   Financial Resource Strain: Not on file  Food Insecurity: Not on file  Transportation Needs: Not on file  Physical Activity: Not on file  Stress: Not on file  Social Connections: Not on file  Intimate Partner Violence: Not on file    Vital Signs: Blood pressure 134/81, pulse 60, temperature 97.9 F (36.6 C), resp. rate 18, height '5\' 9"'$  (1.753 m), weight 219 lb (99.3 kg), SpO2 98 %.  Examination: General Appearance: The patient is well-developed, well-nourished, and in no distress. Neck Circumference: 48 cm Skin: Gross inspection of skin unremarkable. Head: normocephalic, no gross deformities. Eyes: no gross deformities noted. ENT: ears appear grossly normal Neurologic: Alert and oriented. No involuntary movements.    EPWORTH SLEEPINESS SCALE:  Scale:  (0)= no chance of dozing; (1)= slight chance of dozing; (2)= moderate chance of dozing; (3)= high chance of dozing  Chance  Situtation    Sitting and reading: 2    Watching TV: 2    Sitting Inactive in public: 2    As a passenger in car: 3      Lying down to rest: 3    Sitting and talking: 1    Sitting quielty after lunch: 3    In a car, stopped in traffic: 0   TOTAL SCORE:   16 out of 24    SLEEP STUDIES:  Split 10/11/11 AHI 81 Spo102mn 75%   CPAP COMPLIANCE DATA:  Date Range: 09/07/19-09/05/20  Average Daily Use: 5.9 hours  Median Use: 6  Compliance for > 4 Hours: 82%   AHI: 4.0 respiratory events per hour  Days Used: 358/365  Mask Leak: 33.0  95th Percentile Pressure: 15         LABS: No results found for this or any previous visit (from the past 2160  hour(s)).  Radiology: No results found.  No results found.  No results found.    Assessment and Plan: Patient Active Problem List   Diagnosis Date Noted  B12 deficiency 10/21/2019   OA (osteoarthritis) 06/23/2018   History of pulmonary embolus (PE) 06/23/2018   CHF (congestive heart failure), NYHA class II, chronic, diastolic (HCC) 123XX123   PAF (paroxysmal atrial fibrillation) ([place]) 03/30/2015   Peroneal DVT (deep venous thrombosis) ([place])    Hypertension 08/17/2011   GERD (gastroesophageal reflux disease) 08/17/2011   OSA (obstructive sleep apnea) 08/16/2011      The patient does tolerate PAP and reports benefit from PAP use. The patient was reminded how to adjust mask fit and advised to change humidity settings as needed. The patient was also counselled on nightly use. The compliance is good. The AHI is 4.0 with a lot of variability and rising. Patient has lost more than 80lbs since last split study in 2013 and has increasing sleepiness. The patient's machine is past end of life and must be replaced. Will order a new titration study to determine which pressure settings he needs now prior to ordering new machine. Patient interested in APAP. Current machine's pressure lowered to 14 in the meantime since patient states too much air sometimes and has leak.   1. OSA (obstructive sleep apnea) continue excellent compliance. Will order new titration study to determine replacement machine settings. Follow up 30+ days after set up   2. CPAP use counseling CPAP couseling-Discussed importance of adequate CPAP use as well as proper care and cleaning techniques of machine and all supplies.  3. PAF (paroxysmal atrial fibrillation) ([place]) Followed by cardiology, on eliquis  4. CHF (congestive heart failure), NYHA class II, chronic, diastolic ([place]) Followed by cardiology  5. Obesity (BMI 30.0-34.9) Patient has lost significant wt since wt loss surgery last year and continues to work on  additional wt loss. Obesity Counseling: Had a lengthy discussion regarding patients BMI and weight issues. Patient was instructed on portion control as well as increased activity. Also discussed caloric restrictions with trying to maintain intake less than 2000 Kcal. Discussions were made in accordance with the 5As of weight management. Simple actions such as not eating late and if able to, taking a walk is suggested.    General Counseling: I have discussed the findings of the evaluation and examination with Richard [name].  I have also discussed any further diagnostic evaluation thatmay be needed or ordered today. Richard [name] verbalizes understanding of the findings of todays visit. We also reviewed his medications today and discussed drug interactions and side effects including but not limited excessive drowsiness and altered mental states. We also discussed that there is always a risk not just to him but also people around him. he has been encouraged to call the office with any questions or concerns that should arise related to todays visit.  No orders of the defined types were placed in this encounter.       I have personally obtained a history, examined the patient, evaluated laboratory and imaging results, formulated the assessment and plan and placed orders.  This patient was seen by Drema Dallas, PA-C in collaboration with Dr. Devona Konig as a part of collaborative care agreement.   Richelle Ito Saunders Glance, PhD, FAASM  Diplomate, American Board of Sleep Medicine    Allyne Gee, MD [facility] Diplomate ABMS Pulmonary and Critical Care Medicine Sleep medicine

## 2020-09-06 NOTE — Patient Instructions (Signed)

## 2020-09-07 ENCOUNTER — Ambulatory Visit: Payer: Medicare Other | Admitting: Internal Medicine

## 2020-09-07 ENCOUNTER — Encounter: Payer: Self-pay | Admitting: Internal Medicine

## 2020-09-07 ENCOUNTER — Ambulatory Visit (INDEPENDENT_AMBULATORY_CARE_PROVIDER_SITE_OTHER): Payer: Medicare Other | Admitting: Internal Medicine

## 2020-09-07 ENCOUNTER — Other Ambulatory Visit: Payer: Self-pay

## 2020-09-07 VITALS — BP 122/60 | HR 67 | Temp 97.8°F | Resp 17 | Ht 69.0 in | Wt 219.6 lb

## 2020-09-07 DIAGNOSIS — Z6832 Body mass index (BMI) 32.0-32.9, adult: Secondary | ICD-10-CM

## 2020-09-07 DIAGNOSIS — E6609 Other obesity due to excess calories: Secondary | ICD-10-CM | POA: Diagnosis not present

## 2020-09-07 DIAGNOSIS — M10271 Drug-induced gout, right ankle and foot: Secondary | ICD-10-CM

## 2020-09-07 DIAGNOSIS — E538 Deficiency of other specified B group vitamins: Secondary | ICD-10-CM

## 2020-09-07 DIAGNOSIS — W57XXXA Bitten or stung by nonvenomous insect and other nonvenomous arthropods, initial encounter: Secondary | ICD-10-CM

## 2020-09-07 MED ORDER — TRIAMCINOLONE ACETONIDE 0.1 % EX CREA: 1.0000 "application " | TOPICAL_CREAM | Freq: Two times a day (BID) | CUTANEOUS | 0 refills | Status: DC

## 2020-09-07 MED ORDER — PREDNISONE 10 MG PO TABS: ORAL_TABLET | ORAL | 0 refills | Status: AC

## 2020-09-07 MED ORDER — CYANOCOBALAMIN 1000 MCG/ML IJ SOLN
1000.0000 ug | Freq: Once | INTRAMUSCULAR | Status: AC
  Administered 2020-09-07: 1000 ug via INTRAMUSCULAR

## 2020-09-07 NOTE — Progress Notes (Signed)
Subjective:    Patient ID: Richard [name], male    DOB: 02/07/1960, 61 y.o.   MRN: [id number]  HPI  Patient presents the clinic today for right ankle pain, swelling and difficulty with weightbearing.  He reports this started about 1 week ago.  He describes the pain as sharp, throbbing and burning.  He was unable to wait for his appointment for evaluation and went to urgent care 3 days ago.  He was diagnosed with gout.  He was treated with Prednisone and has noticed a little bit of improvement however he realized he was taking the prednisone Prescription backwards.  He would like a refill on his Tramadol today as well.  He also reports rash to bilateral feet, ankle and inner thighs.  He went to cookout a few days ago and felt like something may be crawling on him but he did not see anything on him.  He described it as a tingling sensation that now itches.  The rash has not seemed to spread.  He has tried triamcinolone cream OTC with some improvement in symptoms.  He would like a refill of this today.  He would also like his B12 injection today.  Review of Systems     Past Medical History:  Diagnosis Date   Arthritis    Arthrofibrosis of total knee arthroplasty ([place]) 05/30/2015   Bilateral pulmonary embolism ([place])    a. 03/2015 CTA: acute bilat PE; b. IVC filter placed in 2017--> removed 2018.   Chest pain    a. 2015 Abnl MV;  b. 2015 Cath: nl cors.   Chronic diastolic CHF (congestive heart failure) ([place])    a. 03/2015 Echo: EF 55-65%, poor windows, mildly dil RV.   DVT (deep venous thrombosis) ([place])    a. 03/2015 U/S: occlusive DVT w/in the distal aspect of the Left fem vein through the L popliteal vein.   Dyspnea    On exertion   Dysrhythmia    afib    GERD (gastroesophageal reflux disease)    Headache    Hypertension    Hypertensive heart disease    Obesity    OSA (obstructive sleep apnea)    uses CPAP nightly settings at 15    PAF (paroxysmal atrial fibrillation) ([place])     Pneumonia    hx of years ago    Primary localized osteoarthritis of left knee    a. 11/2014 s/p L TKA.   Primary localized osteoarthritis of right knee    a. 02/2014 s/p R Partial Knee arthroplasty.   Stiffness of left knee    a. 12/2014 s/p manipulation under anesthesia.   Urine incontinence     Current Outpatient Medications  Medication Sig Dispense Refill   baclofen (LIORESAL) 10 MG tablet TAKE 1 TO 2 TABLETS BY MOUTH 3 TIMES DAILY AS NEEDED FOR MUSCLE SPASMS 180 each 0   cyanocobalamin (,VITAMIN B-12,) 1000 MCG/ML injection Inject 1 mL (1,000 mcg total) into the muscle every 30 (thirty) days. 3 mL 1   ELIQUIS 5 MG TABS tablet TAKE 1 TABLET BY MOUTH 2 TIMES DAILY 60 tablet 6   flecainide (TAMBOCOR) 100 MG tablet TAKE 1 TABLET BY MOUTH TWICE (2) DAILY 180 tablet 3   furosemide (LASIX) 20 MG tablet Take 1 tablet (20 mg total) by mouth daily. (Patient taking differently: Take 20 mg by mouth daily as needed.) 5 tablet 0   Multiple Vitamins-Minerals (MULTIVITAMIN WITH MINERALS) tablet Take 1 tablet by mouth daily.     pantoprazole (  PROTONIX) 40 MG tablet TAKE 1 TABLET BY MOUTH DAILY 90 tablet 0   Sennosides-Docusate Sodium (SM STOOL SOFTENER/LAXATIVE PO) Take 8.6 mg by mouth daily.     traMADol (ULTRAM) 50 MG tablet Take 1-2 tablets (50-100 mg total) by mouth at bedtime as needed. 30 tablet 0   predniSONE (DELTASONE) 10 MG tablet Take 6 tablets (60 mg total) by mouth daily for 1 day, THEN 5 tablets (50 mg total) daily for 1 day, THEN 4 tablets (40 mg total) daily for 1 day, THEN 3 tablets (30 mg total) daily for 1 day, THEN 2 tablets (20 mg total) daily for 1 day, THEN 1 tablet (10 mg total) daily for 1 day. 21 tablet 0   triamcinolone cream (KENALOG) 0.1 % Apply 1 application topically 2 (two) times daily. 30 g 0   No current facility-administered medications for this visit.    Allergies  Allergen Reactions   Penicillins Anaphylaxis and Other (See Comments)    From childhood; uncertain  of reaction Has patient had a PCN reaction causing immediate rash, facial/tongue/throat swelling, SOB or lightheadedness with hypotension: Unk Has patient had a PCN reaction causing severe rash involving mucus membranes or skin necrosis: Unk Has patient had a PCN reaction that required hospitalization: Unk Has patient had a PCN reaction occurring within the last 10 years: No If all of the above answers are "NO", then may proceed with Cephalosporin use.   Vancomycin Other (See Comments)    Red Mans' syndrome   Penicillins Other (See Comments)    Childhood   Vancomycin Other (See Comments)    Red man Syndrome    Family History  Problem Relation Age of Onset   Coronary artery disease Mother    Hyperlipidemia Mother    Hypertension Mother    Arthritis Mother    Coronary artery disease Father    Heart disease Paternal Grandmother    Alzheimer's disease Paternal Grandfather     Social History   Socioeconomic History   Marital status: Married    Spouse name: Not on file   Number of children: 2   Years of education: 12   Highest education level: Not on file  Occupational History   Occupation: Energy manager: [employer]  Tobacco Use   Smoking status: Never   Smokeless tobacco: Never  Vaping Use   Vaping Use: Never used  Substance and Sexual Activity   Alcohol use: Not Currently    Comment: \   Drug use: No   Sexual activity: Yes  Other Topics Concern   Not on file  Social History Narrative   ** Merged History Encounter **       Social Determinants of Health   Financial Resource Strain: Not on file  Food Insecurity: Not on file  Transportation Needs: Not on file  Physical Activity: Not on file  Stress: Not on file  Social Connections: Not on file  Intimate Partner Violence: Not on file     Constitutional: Denies fever, malaise, fatigue, headache or abrupt weight changes.  Respiratory: Denies difficulty breathing, shortness of breath, cough or sputum  production.   Cardiovascular: Denies chest pain, chest tightness, palpitations or swelling in the hands or feet.  Musculoskeletal: Pt reports right ankle pain and swelling. Denies decrease in range of motion, difficulty with gait, muscle pain.  Skin: Pt reports rash of BLE. Denies lesions or ulcercations.    No other specific complaints in a complete review of systems (except as listed in  HPI above).  Objective:   Physical Exam   BP 122/60 (BP Location: Left Arm, Patient Position: Sitting, Cuff Size: Large)   Pulse 67   Temp 97.8 F (36.6 C) (Temporal)   Resp 17   Ht '5\' 9"'$  (1.753 m)   Wt 219 lb 9.6 oz (99.6 kg)   SpO2 98%   BMI 32.43 kg/m  Wt Readings from Last 3 Encounters:  09/07/20 219 lb 9.6 oz (99.6 kg)  09/06/20 219 lb (99.3 kg)  08/28/20 219 lb 9.6 oz (99.6 kg)    General: Appears her stated age, obese, in NAD. Skin: Grouped papular lesions noted of bilateral feet and ankles. Cardiovascular: Normal rate and rhythm.  Pulmonary/Chest: Normal effort and positive vesicular breath sounds.  Musculoskeletal: Pain with palpation over the right medial malleolus.  1+ swelling noted at the left ankle.  Normal flexion, extension and rotation of the left ankle.  Gait slow and steady without device. Neurological: Alert and oriented.   BMET    Component Value Date/Time   NA 142 01/17/2020 1214   NA 141 10/31/2017 1438   K 4.3 01/17/2020 1214   CL 106 01/17/2020 1214   CO2 29 01/17/2020 1214   GLUCOSE 83 01/17/2020 1214   BUN 16 01/17/2020 1214   BUN 18 10/31/2017 1438   CREATININE 1.30 01/17/2020 1214   CREATININE 1.27 12/06/2015 1600   CALCIUM 9.5 01/17/2020 1214   GFRNONAA >60 12/26/2019 0123   GFRAA >60 10/19/2019 1337   GFRAA >60 10/19/2019 1337    Lipid Panel     Component Value Date/Time   CHOL 162 01/17/2020 1214   CHOL 170 10/31/2017 1438   TRIG 112.0 01/17/2020 1214   HDL 39.80 01/17/2020 1214   HDL 36 (L) 10/31/2017 1438   CHOLHDL 4 01/17/2020 1214    VLDL 22.4 01/17/2020 1214   LDLCALC 100 (H) 01/17/2020 1214   LDLCALC 108 (H) 10/31/2017 1438    CBC    Component Value Date/Time   WBC 7.5 01/17/2020 1214   RBC 4.92 01/17/2020 1214   HGB 15.1 01/17/2020 1214   HGB 14.0 10/31/2017 1438   HCT 44.8 01/17/2020 1214   HCT 43.5 10/31/2017 1438   PLT 178.0 01/17/2020 1214   PLT 220 10/31/2017 1438   MCV 90.9 01/17/2020 1214   MCV 83 10/31/2017 1438   MCH 30.4 12/26/2019 0123   MCHC 33.6 01/17/2020 1214   RDW 14.0 01/17/2020 1214   RDW 14.4 10/31/2017 1438   LYMPHSABS 1.2 10/27/2019 1133   LYMPHSABS 1.6 10/31/2017 1438   MONOABS 1.0 10/27/2019 1133   EOSABS 0.0 10/27/2019 1133   EOSABS 0.2 10/31/2017 1438   BASOSABS 0.0 10/27/2019 1133   BASOSABS 0.0 10/31/2017 1438    Hgb A1C Lab Results  Component Value Date   HGBA1C 4.9 01/17/2020           Assessment & Plan:   Bug Bites: BLE:  Rx for Pred taper x6 days Triamcinolone cream refilled today  Gout, Right Ankle:  Rx for Pred taper x6 days Encourage low purine diet We will check uric acid at next visit  Return precautions discussed This visit occurred during the SARS-CoV-2 public health emergency.  Safety protocols were in place, including screening questions prior to the visit, additional usage of staff PPE, and extensive cleaning of exam room while observing appropriate contact time as indicated for disinfecting solutions.

## 2020-09-07 NOTE — Assessment & Plan Note (Addendum)
Status post sleeve gastrectomy Encourage diet and exercise for weight loss

## 2020-09-07 NOTE — Assessment & Plan Note (Signed)
B12 injection today Will check B12 level at next visit

## 2020-09-07 NOTE — Patient Instructions (Signed)
Gout Gout is painful swelling of your joints. Gout is a type of arthritis. It is caused by having too much uric acid in your body. Uric acid is a chemical that is made when your body breaks down substances called purines. If your body has too much uric acid, sharp crystals can form and build up in your joints. This causes pain and swelling. Gout attacks can happen quickly and be very painful (acute gout). Over time, the attacks can affect more joints and happen more often (chronic gout). What are the causes? Too much uric acid in your blood. This can happen because: Your kidneys do not remove enough uric acid from your blood. Your body makes too much uric acid. You eat too many foods that are high in purines. These foods include organ meats, some seafood, and beer. Trauma or stress. What increases the risk? Having a family history of gout. Being male and middle-aged. Being male and having gone through menopause. Being very overweight (obese). Drinking alcohol, especially beer. Not having enough water in the body (being dehydrated). Losing weight too quickly. Having an organ transplant. Having lead poisoning. Taking certain medicines. Having kidney disease. Having a skin condition called psoriasis. What are the signs or symptoms? An attack of acute gout usually happens in just one joint. The most common place is the big toe. Attacks often start at night. Other joints that may be affected include joints of the feet, ankle, knee, fingers, wrist, or elbow. Symptoms of an attack may include: Very bad pain. Warmth. Swelling. Stiffness. Shiny, red, or purple skin. Tenderness. The affected joint may be very painful to touch. Chills and fever. Chronic gout may cause symptoms more often. More joints may be involved. You may also have white or yellow lumps (tophi) on your hands or feet or in other areas near your joints. How is this treated? Treatment for this condition has two phases:  treating an acute attack and preventing future attacks. Acute gout treatment may include: NSAIDs. Steroids. These are taken by mouth or injected into a joint. Colchicine. This medicine relieves pain and swelling. It can be given by mouth or through an IV tube. Preventive treatment may include: Taking small doses of NSAIDs or colchicine daily. Using a medicine that reduces uric acid levels in your blood. Making changes to your diet. You may need to see a food expert (dietitian) about what to eat and drink to prevent gout. Follow these instructions at home: During a gout attack  If told, put ice on the painful area: Put ice in a plastic bag. Place a towel between your skin and the bag. Leave the ice on for 20 minutes, 2-3 times a day. Raise (elevate) the painful joint above the level of your heart as often as you can. Rest the joint as much as possible. If the joint is in your leg, you may be given crutches. Follow instructions from your doctor about what you cannot eat or drink. Avoiding future gout attacks Eat a low-purine diet. Avoid foods and drinks such as: Liver. Kidney. Anchovies. Asparagus. Herring. Mushrooms. Mussels. Beer. Stay at a healthy weight. If you want to lose weight, talk with your doctor. Do not lose weight too fast. Start or continue an exercise plan as told by your doctor. Eating and drinking Drink enough fluids to keep your pee (urine) pale yellow. If you drink alcohol: Limit how much you use to: 0-1 drink a day for women. 0-2 drinks a day for men. Be aware of   how much alcohol is in your drink. In the [place] one drink equals one 12 oz bottle of beer (355 mL), one 5 oz glass of wine (148 mL), or one 1 oz glass of hard liquor (44 mL). General instructions Take over-the-counter and prescription medicines only as told by your doctor. Do not drive or use heavy machinery while taking prescription pain medicine. Return to your normal activities as told by your  doctor. Ask your doctor what activities are safe for you. Keep all follow-up visits as told by your doctor. This is important. Contact a doctor if: You have another gout attack. You still have symptoms of a gout attack after 10 days of treatment. You have problems (side effects) because of your medicines. You have chills or a fever. You have burning pain when you pee (urinate). You have pain in your lower back or belly. Get help right away if: You have very bad pain. Your pain cannot be controlled. You cannot pee. Summary Gout is painful swelling of the joints. The most common site of pain is the big toe, but it can affect other joints. Medicines and avoiding some foods can help to prevent and treat gout attacks. This information is not intended to replace advice given to you by your health care provider. Make sure you discuss any questions you have with your health care provider. Document Revised: 07/30/2017 Document Reviewed: 07/30/2017 Elsevier Patient Education  2022 Elsevier Inc.  

## 2020-09-08 ENCOUNTER — Other Ambulatory Visit: Payer: Self-pay | Admitting: Internal Medicine

## 2020-09-08 NOTE — Telephone Encounter (Signed)
Requested medications are due for refill today yes  Requested medications are on the active medication list yes  Last refill 3/10  Last visit 8/18  Future visit scheduled no  Notes to clinic Not Delegated

## 2020-09-11 ENCOUNTER — Other Ambulatory Visit: Payer: Self-pay | Admitting: Internal Medicine

## 2020-10-05 ENCOUNTER — Ambulatory Visit
Admission: RE | Admit: 2020-10-05 | Discharge: 2020-10-05 | Disposition: A | Discharge status: Home / Self Care | Payer: Medicare Other | Attending: Internal Medicine | Admitting: Internal Medicine

## 2020-10-05 ENCOUNTER — Other Ambulatory Visit: Payer: Self-pay

## 2020-10-05 ENCOUNTER — Other Ambulatory Visit: Payer: Self-pay | Admitting: Internal Medicine

## 2020-10-05 ENCOUNTER — Ambulatory Visit (INDEPENDENT_AMBULATORY_CARE_PROVIDER_SITE_OTHER): Payer: Medicare Other | Admitting: Internal Medicine

## 2020-10-05 ENCOUNTER — Ambulatory Visit
Admission: RE | Admit: 2020-10-05 | Discharge: 2020-10-05 | Disposition: A | Discharge status: Home / Self Care | Payer: Medicare Other | Source: Ambulatory Visit | Attending: Internal Medicine | Admitting: Internal Medicine

## 2020-10-05 ENCOUNTER — Encounter: Payer: Self-pay | Admitting: Internal Medicine

## 2020-10-05 VITALS — BP 125/79 | HR 71 | Temp 97.3°F | Resp 17 | Ht 69.0 in | Wt 218.4 lb

## 2020-10-05 DIAGNOSIS — Z6832 Body mass index (BMI) 32.0-32.9, adult: Secondary | ICD-10-CM

## 2020-10-05 DIAGNOSIS — M10071 Idiopathic gout, right ankle and foot: Secondary | ICD-10-CM

## 2020-10-05 DIAGNOSIS — L84 Corns and callosities: Secondary | ICD-10-CM

## 2020-10-05 DIAGNOSIS — E6609 Other obesity due to excess calories: Secondary | ICD-10-CM

## 2020-10-05 MED ORDER — METHYLPREDNISOLONE ACETATE 80 MG/ML IJ SUSP
80.0000 mg | Freq: Once | INTRAMUSCULAR | Status: AC
  Administered 2020-10-05: 80 mg via INTRAMUSCULAR

## 2020-10-05 MED ORDER — COLCHICINE 0.6 MG PO TABS: 0.6000 mg | ORAL_TABLET | Freq: Two times a day (BID) | ORAL | 0 refills | Status: DC | PRN

## 2020-10-05 NOTE — Progress Notes (Signed)
Subjective:    Patient ID: Richard [name], male    DOB: 05/17/1960, 60 y.o.   MRN: [id number]  HPI  Patient presents the clinic today for follow-up of left ankle pain and swelling.  He report this started 2 weeks ago. He describes the pain as sharp, throbbing and burning. The pain is worse with ambulation. He was seen in urgent care 8/15 for the same.  Diagnosed with gout and treated with Prednisone.  He followed up in the office 8/22 for the same.  He realized at that time he had been taking the Prednisone prescription backwards and so we repeated his taper.  Since that time, he reports symptoms improved while on the Prednisone but have since returned. He denies any injury to the area.  Review of Systems     Past Medical History:  Diagnosis Date   Arthritis    Arthrofibrosis of total knee arthroplasty ([place]) 05/30/2015   Bilateral pulmonary embolism ([place])    a. 03/2015 CTA: acute bilat PE; b. IVC filter placed in 2017--> removed 2018.   Chest pain    a. 2015 Abnl MV;  b. 2015 Cath: nl cors.   Chronic diastolic CHF (congestive heart failure) ([place])    a. 03/2015 Echo: EF 55-65%, poor windows, mildly dil RV.   DVT (deep venous thrombosis) ([place])    a. 03/2015 U/S: occlusive DVT w/in the distal aspect of the Left fem vein through the L popliteal vein.   Dyspnea    On exertion   Dysrhythmia    afib    GERD (gastroesophageal reflux disease)    Headache    Hypertension    Hypertensive heart disease    Obesity    OSA (obstructive sleep apnea)    uses CPAP nightly settings at 15    PAF (paroxysmal atrial fibrillation) ([place])    Pneumonia    hx of years ago    Primary localized osteoarthritis of left knee    a. 11/2014 s/p L TKA.   Primary localized osteoarthritis of right knee    a. 02/2014 s/p R Partial Knee arthroplasty.   Stiffness of left knee    a. 12/2014 s/p manipulation under anesthesia.   Urine incontinence     Current Outpatient Medications  Medication Sig Dispense Refill    baclofen (LIORESAL) 10 MG tablet TAKE 1 TO 2 TABLETS BY MOUTH 3 TIMES DAILY AS NEEDED FOR MUSCLE SPASMS 180 each 0   cyanocobalamin (,VITAMIN B-12,) 1000 MCG/ML injection Inject 1 mL (1,000 mcg total) into the muscle every 30 (thirty) days. 3 mL 1   ELIQUIS 5 MG TABS tablet TAKE 1 TABLET BY MOUTH 2 TIMES DAILY 60 tablet 6   flecainide (TAMBOCOR) 100 MG tablet TAKE 1 TABLET BY MOUTH TWICE (2) DAILY 180 tablet 3   furosemide (LASIX) 20 MG tablet Take 1 tablet (20 mg total) by mouth daily. (Patient taking differently: Take 20 mg by mouth daily as needed.) 5 tablet 0   Multiple Vitamins-Minerals (MULTIVITAMIN WITH MINERALS) tablet Take 1 tablet by mouth daily.     pantoprazole (PROTONIX) 40 MG tablet TAKE 1 TABLET BY MOUTH ONCE A DAY 90 tablet 0   Sennosides-Docusate Sodium (SM STOOL SOFTENER/LAXATIVE PO) Take 8.6 mg by mouth daily.     traMADol (ULTRAM) 50 MG tablet TAKE 1 TO 2 TABLETS BY MOUTH AT BEDTIME NEEDED 30 tablet 0   triamcinolone cream (KENALOG) 0.1 % Apply 1 application topically 2 (two) times daily. 30 g 0   No current  facility-administered medications for this visit.    Allergies  Allergen Reactions   Penicillins Anaphylaxis and Other (See Comments)    From childhood; uncertain of reaction Has patient had a PCN reaction causing immediate rash, facial/tongue/throat swelling, SOB or lightheadedness with hypotension: Unk Has patient had a PCN reaction causing severe rash involving mucus membranes or skin necrosis: Unk Has patient had a PCN reaction that required hospitalization: Unk Has patient had a PCN reaction occurring within the last 10 years: No If all of the above answers are "NO", then may proceed with Cephalosporin use.   Vancomycin Other (See Comments)    Red Mans' syndrome   Penicillins Other (See Comments)    Childhood   Vancomycin Other (See Comments)    Red man Syndrome    Family History  Problem Relation Age of Onset   Coronary artery disease Mother     Hyperlipidemia Mother    Hypertension Mother    Arthritis Mother    Coronary artery disease Father    Heart disease Paternal Grandmother    Alzheimer's disease Paternal Grandfather     Social History   Socioeconomic History   Marital status: Married    Spouse name: Not on file   Number of children: 2   Years of education: 12   Highest education level: Not on file  Occupational History   Occupation: Energy manager: [employer]  Tobacco Use   Smoking status: Never   Smokeless tobacco: Never  Vaping Use   Vaping Use: Never used  Substance and Sexual Activity   Alcohol use: Not Currently    Comment: \   Drug use: No   Sexual activity: Yes  Other Topics Concern   Not on file  Social History Narrative   ** Merged History Encounter **       Social Determinants of Health   Financial Resource Strain: Not on file  Food Insecurity: Not on file  Transportation Needs: Not on file  Physical Activity: Not on file  Stress: Not on file  Social Connections: Not on file  Intimate Partner Violence: Not on file     Constitutional: Denies fever, malaise, fatigue, headache or abrupt weight changes.  Respiratory: Denies difficulty breathing, shortness of breath, cough or sputum production.   Cardiovascular: Denies chest pain, chest tightness, palpitations or swelling in the hands or feet.  Musculoskeletal: Pt reports right ankle pain and swelling, decreased range of motion and difficulty with gait. Denies muscle pain.  Skin: Pt reports redness and warmth of right ankle. Denies rashes, lesions or ulcercations.    No other specific complaints in a complete review of systems (except as listed in HPI above).  Objective:   Physical Exam   BP 125/79 (BP Location: Right Arm, Patient Position: Sitting, Cuff Size: Large)   Pulse 71   Temp (!) 97.3 F (36.3 C) (Temporal)   Resp 17   Ht '5\' 9"'$  (1.753 m)   Wt 218 lb 6.4 oz (99.1 kg)   SpO2 99%   BMI 32.25 kg/m   Wt Readings from  Last 3 Encounters:  09/07/20 219 lb 9.6 oz (99.6 kg)  09/06/20 219 lb (99.3 kg)  08/28/20 219 lb 9.6 oz (99.6 kg)    General: Appears his stated age, obese, in NAD. Skin: Warm, dry and intact. Redness noted over right medial malleolus. Corn noted under plantar aspect of 5 mid metatarsal. Cardiovascular: Normal rate and rhythm. Pedal pulse 2+ on the left. Pulmonary/Chest: Normal effort and positive  vesicular breath sounds. No respiratory distress. No wheezes, rales or ronchi noted.  Musculoskeletal: Normal flexion and extension of the right ankle. Decreased rotation secondary to pain. 1+ swelling noted over the medial malleolus. Limping gait. Neurological: Alert and oriented.  BMET    Component Value Date/Time   NA 142 01/17/2020 1214   NA 141 10/31/2017 1438   K 4.3 01/17/2020 1214   CL 106 01/17/2020 1214   CO2 29 01/17/2020 1214   GLUCOSE 83 01/17/2020 1214   BUN 16 01/17/2020 1214   BUN 18 10/31/2017 1438   CREATININE 1.30 01/17/2020 1214   CREATININE 1.27 12/06/2015 1600   CALCIUM 9.5 01/17/2020 1214   GFRNONAA >60 12/26/2019 0123   GFRAA >60 10/19/2019 1337   GFRAA >60 10/19/2019 1337    Lipid Panel     Component Value Date/Time   CHOL 162 01/17/2020 1214   CHOL 170 10/31/2017 1438   TRIG 112.0 01/17/2020 1214   HDL 39.80 01/17/2020 1214   HDL 36 (L) 10/31/2017 1438   CHOLHDL 4 01/17/2020 1214   VLDL 22.4 01/17/2020 1214   LDLCALC 100 (H) 01/17/2020 1214   LDLCALC 108 (H) 10/31/2017 1438    CBC    Component Value Date/Time   WBC 7.5 01/17/2020 1214   RBC 4.92 01/17/2020 1214   HGB 15.1 01/17/2020 1214   HGB 14.0 10/31/2017 1438   HCT 44.8 01/17/2020 1214   HCT 43.5 10/31/2017 1438   PLT 178.0 01/17/2020 1214   PLT 220 10/31/2017 1438   MCV 90.9 01/17/2020 1214   MCV 83 10/31/2017 1438   MCH 30.4 12/26/2019 0123   MCHC 33.6 01/17/2020 1214   RDW 14.0 01/17/2020 1214   RDW 14.4 10/31/2017 1438   LYMPHSABS 1.2 10/27/2019 1133   LYMPHSABS 1.6 10/31/2017  1438   MONOABS 1.0 10/27/2019 1133   EOSABS 0.0 10/27/2019 1133   EOSABS 0.2 10/31/2017 1438   BASOSABS 0.0 10/27/2019 1133   BASOSABS 0.0 10/31/2017 1438    Hgb A1C Lab Results  Component Value Date   HGBA1C 4.9 01/17/2020           Assessment & Plan:   Gout of Right Ankle:  Xray right ankle today Uric acid level today Depo Medrol 80 mg IM x 1 RX for Colchicine 0.6 mg BID prn Encouraged low purine diet  Corn of Right Foot:  Encouraged him to get a corn pad and wear as needed for comfort Discussed referral to podiatry for further evaluation but he would like to hold off at this time  Will follow up after labs and imaging, return precautions discussed Webb Silversmith, NP This visit occurred during the SARS-CoV-2 public health emergency.  Safety protocols were in place, including screening questions prior to the visit, additional usage of staff PPE, and extensive cleaning of exam room while observing appropriate contact time as indicated for disinfecting solutions.

## 2020-10-05 NOTE — Addendum Note (Signed)
Addended by: Wilson Singer on: 10/05/2020 03:50 PM   Modules accepted: Orders

## 2020-10-05 NOTE — Patient Instructions (Signed)
Gout Gout is painful swelling of your joints. Gout is a type of arthritis. It is caused by having too much uric acid in your body. Uric acid is a chemical that is made when your body breaks down substances called purines. If your body has too much uric acid, sharp crystals can form and build up in your joints. This causes pain and swelling. Gout attacks can happen quickly and be very painful (acute gout). Over time, the attacks can affect more joints and happen more often (chronic gout). What are the causes? Too much uric acid in your blood. This can happen because: Your kidneys do not remove enough uric acid from your blood. Your body makes too much uric acid. You eat too many foods that are high in purines. These foods include organ meats, some seafood, and beer. Trauma or stress. What increases the risk? Having a family history of gout. Being male and middle-aged. Being male and having gone through menopause. Being very overweight (obese). Drinking alcohol, especially beer. Not having enough water in the body (being dehydrated). Losing weight too quickly. Having an organ transplant. Having lead poisoning. Taking certain medicines. Having kidney disease. Having a skin condition called psoriasis. What are the signs or symptoms? An attack of acute gout usually happens in just one joint. The most common place is the big toe. Attacks often start at night. Other joints that may be affected include joints of the feet, ankle, knee, fingers, wrist, or elbow. Symptoms of an attack may include: Very bad pain. Warmth. Swelling. Stiffness. Shiny, red, or purple skin. Tenderness. The affected joint may be very painful to touch. Chills and fever. Chronic gout may cause symptoms more often. More joints may be involved. You may also have white or yellow lumps (tophi) on your hands or feet or in other areas near your joints. How is this treated? Treatment for this condition has two phases:  treating an acute attack and preventing future attacks. Acute gout treatment may include: NSAIDs. Steroids. These are taken by mouth or injected into a joint. Colchicine. This medicine relieves pain and swelling. It can be given by mouth or through an IV tube. Preventive treatment may include: Taking small doses of NSAIDs or colchicine daily. Using a medicine that reduces uric acid levels in your blood. Making changes to your diet. You may need to see a food expert (dietitian) about what to eat and drink to prevent gout. Follow these instructions at home: During a gout attack  If told, put ice on the painful area: Put ice in a plastic bag. Place a towel between your skin and the bag. Leave the ice on for 20 minutes, 2-3 times a day. Raise (elevate) the painful joint above the level of your heart as often as you can. Rest the joint as much as possible. If the joint is in your leg, you may be given crutches. Follow instructions from your doctor about what you cannot eat or drink. Avoiding future gout attacks Eat a low-purine diet. Avoid foods and drinks such as: Liver. Kidney. Anchovies. Asparagus. Herring. Mushrooms. Mussels. Beer. Stay at a healthy weight. If you want to lose weight, talk with your doctor. Do not lose weight too fast. Start or continue an exercise plan as told by your doctor. Eating and drinking Drink enough fluids to keep your pee (urine) pale yellow. If you drink alcohol: Limit how much you use to: 0-1 drink a day for women. 0-2 drinks a day for men. Be aware of   how much alcohol is in your drink. In the [place] one drink equals one 12 oz bottle of beer (355 mL), one 5 oz glass of wine (148 mL), or one 1 oz glass of hard liquor (44 mL). General instructions Take over-the-counter and prescription medicines only as told by your doctor. Do not drive or use heavy machinery while taking prescription pain medicine. Return to your normal activities as told by your  doctor. Ask your doctor what activities are safe for you. Keep all follow-up visits as told by your doctor. This is important. Contact a doctor if: You have another gout attack. You still have symptoms of a gout attack after 10 days of treatment. You have problems (side effects) because of your medicines. You have chills or a fever. You have burning pain when you pee (urinate). You have pain in your lower back or belly. Get help right away if: You have very bad pain. Your pain cannot be controlled. You cannot pee. Summary Gout is painful swelling of the joints. The most common site of pain is the big toe, but it can affect other joints. Medicines and avoiding some foods can help to prevent and treat gout attacks. This information is not intended to replace advice given to you by your health care provider. Make sure you discuss any questions you have with your health care provider. Document Revised: 07/30/2017 Document Reviewed: 07/30/2017 Elsevier Patient Education  2022 Elsevier Inc.  

## 2020-10-05 NOTE — Telephone Encounter (Signed)
Requested medication (s) are due for refill today: Yes  Requested medication (s) are on the active medication list: Yes  Last refill:  06/29/20  Future visit scheduled: Yes  Notes to clinic:  Unable to refill per protocol, cannot delegate.      Requested Prescriptions  Pending Prescriptions Disp Refills   baclofen (LIORESAL) 10 MG tablet [Pharmacy Med Name: BACLOFEN 10 MG TAB] 180 each 0    Sig: TAKE 1 TO 2 TABLETS BY MOUTH 3 TIMES DAILY AS NEEDED FOR MUSCLE SPASMS     Not Delegated - Analgesics:  Muscle Relaxants Failed - 10/05/2020  2:20 PM      Failed - This refill cannot be delegated      Passed - Valid encounter within last 6 months    Recent Outpatient Visits           Today Acute idiopathic gout of right ankle   [place], Coralie Keens, NP   4 weeks ago Acute drug-induced gout of right ankle   [facility] [place], Coralie Keens, NP   3 months ago [facility] [place], [place] W, NP   4 months ago Seasonal allergic rhinitis due to pollen   [facility] [place], Coralie Keens, NP

## 2020-10-05 NOTE — Assessment & Plan Note (Signed)
Encouraged diet and exercise for weight loss ?

## 2020-10-06 ENCOUNTER — Encounter: Payer: Self-pay | Admitting: Internal Medicine

## 2020-10-06 LAB — URIC ACID: Uric Acid, Serum: 7.7 mg/dL (ref 4.0–8.0)

## 2020-10-06 NOTE — Telephone Encounter (Signed)
Patient reported at his visit yesterday that he was not taking this.  Refilled declined

## 2020-10-08 ENCOUNTER — Encounter: Payer: Self-pay | Admitting: Internal Medicine

## 2020-10-12 ENCOUNTER — Other Ambulatory Visit: Payer: Self-pay

## 2020-10-12 ENCOUNTER — Ambulatory Visit (INDEPENDENT_AMBULATORY_CARE_PROVIDER_SITE_OTHER): Payer: Medicare Other

## 2020-10-12 DIAGNOSIS — E538 Deficiency of other specified B group vitamins: Secondary | ICD-10-CM

## 2020-10-12 MED ORDER — CYANOCOBALAMIN 1000 MCG/ML IJ SOLN
1000.0000 ug | Freq: Once | INTRAMUSCULAR | Status: AC
  Administered 2020-10-12: 1000 ug via INTRAMUSCULAR

## 2020-10-13 ENCOUNTER — Ambulatory Visit: Payer: Medicare Other

## 2020-10-20 ENCOUNTER — Other Ambulatory Visit: Payer: Self-pay | Admitting: Cardiovascular Disease

## 2020-10-20 DIAGNOSIS — I82492 Acute embolism and thrombosis of other specified deep vein of left lower extremity: Secondary | ICD-10-CM

## 2020-10-20 DIAGNOSIS — R0789 Other chest pain: Secondary | ICD-10-CM

## 2020-10-20 DIAGNOSIS — I2699 Other pulmonary embolism without acute cor pulmonale: Secondary | ICD-10-CM

## 2020-10-20 DIAGNOSIS — I480 Paroxysmal atrial fibrillation: Secondary | ICD-10-CM

## 2020-10-20 NOTE — Telephone Encounter (Signed)
Patient states he will be out of medication by Wednesday. He is scheduled for 12/28/2020 to see Dr. Fletcher Anon.

## 2020-10-20 NOTE — Telephone Encounter (Signed)
Pt requesting 90 day refill. Pt overdue for 6 month f/u. Pt is scheduled for 12/2020. Please advise for refill.

## 2020-10-20 NOTE — Telephone Encounter (Signed)
LMOV to schedule  

## 2020-10-20 NOTE — Telephone Encounter (Signed)
Please contact pt for 6 month f/u. Pt overdue for f/u needs refills.

## 2020-10-27 ENCOUNTER — Other Ambulatory Visit: Payer: Self-pay | Admitting: Internal Medicine

## 2020-10-27 MED ORDER — COLCHICINE 0.6 MG PO TABS: 0.6000 mg | ORAL_TABLET | Freq: Two times a day (BID) | ORAL | 0 refills | Status: DC | PRN

## 2020-10-30 ENCOUNTER — Other Ambulatory Visit: Payer: Self-pay

## 2020-10-30 ENCOUNTER — Encounter: Payer: Medicare Other | Attending: Surgery | Admitting: Skilled Nursing Facility1

## 2020-10-30 DIAGNOSIS — E669 Obesity, unspecified: Secondary | ICD-10-CM | POA: Diagnosis not present

## 2020-10-30 DIAGNOSIS — I509 Heart failure, unspecified: Secondary | ICD-10-CM | POA: Insufficient documentation

## 2020-10-30 DIAGNOSIS — Z6832 Body mass index (BMI) 32.0-32.9, adult: Secondary | ICD-10-CM | POA: Insufficient documentation

## 2020-10-30 DIAGNOSIS — Z9884 Bariatric surgery status: Secondary | ICD-10-CM | POA: Diagnosis not present

## 2020-10-30 DIAGNOSIS — Z713 Dietary counseling and surveillance: Secondary | ICD-10-CM | POA: Diagnosis not present

## 2020-10-30 DIAGNOSIS — I110 Hypertensive heart disease with heart failure: Secondary | ICD-10-CM | POA: Insufficient documentation

## 2020-10-30 NOTE — Progress Notes (Signed)
Bariatric Nutrition Follow-Up Visit Medical Nutrition Therapy   Post-Operative sleeve Surgery Surgery Date: 10/26/2019  NUTRITION ASSESSMENT  Anthropometrics  Start weight at [facility]: 302.4 lbs  Today's weight: 215 lbs  Body Composition Scale Date 08/28/2020  Weight  lbs 253.8 219.6  Total Body Fat  % 33.1 28.2     Visceral Fat 26 19  Fat-Free Mass  % 66.8 71.7     Total Body Water  % 47.8 52.7     Muscle-Mass  lbs 47.8 41.3  BMI 37.2 32.2  Body Fat Displacement ---         Torso  lbs 52.1 38.4        Left Leg  lbs 10.4 7.6        Right Leg  lbs 10.4 7.6        Left Arm  lbs 5.2 3.8        Right Arm  lbs 5.2 3.8   Clinical  Medical hx: HTN, CHF. GERD, Renal Stone Medications: off HTN meds Labs:    Lifestyle & Dietary Hx   Pt states he recently had a gout flare and has an ankle fracture but still doing a lot of hustling. Pt states he tried red meat but it did not taste good.  Pt states he feels really good about his success. Pt states he tried to eat a babyruth the other but it made him sick.   Estimated daily fluid intake: 16.9 X 5-6 oz Estimated daily protein intake: 100+g Supplements: inappropriate bari multi and calcium and b12 shot (b12 6 months ago 633) Current average weekly physical activity: unable due to fractured ankle  24-Hr Dietary Recall First Meal 8:30-9: 1 or 2 egg + 1 sausage + tomatoes + cheese sometimes with cantaloupe  Snack: sometimes apple Second Meal: chicken salad + fruit or tuna fish Snack:  Third Meal 4:30-5pm: chicken or fish or pork + green beans sometimes beans + tomatoes + cucumbers  Snack:  Beverages: water and gatorade zero and diluted sweet tea, grape soda sometimes   Post-Op Goals/ Signs/ Symptoms Using straws: no Drinking while eating: no Chewing/swallowing difficulties: no Changes in vision: no Changes to mood/headaches: no Hair loss/changes to skin/nails: no Difficulty focusing/concentrating: no Sweating:  no Dizziness/lightheadedness: no Palpitations: no Carbonated/caffeinated beverages: no N/V/D/C/Gas: no Abdominal pain: no Dumping syndrome: no    NUTRITION DIAGNOSIS  Overweight/obesity ([place]-3.3) related to past poor dietary habits and physical inactivity as evidenced by completed bariatric surgery and following dietary guidelines for continued weight loss and healthy nutrition status.     NUTRITION INTERVENTION Continued  Nutrition counseling (C-1) and education (E-2) to facilitate bariatric surgery goals, including: The importance of consuming adequate calories as well as certain nutrients daily due to the body's need for essential vitamins, minerals, and fats The importance of daily physical activity and to reach a goal of at least 150 minutes of moderate to vigorous physical activity weekly (or as directed by their physician) due to benefits such as increased musculature and improved lab values The importance of intuitive eating specifically learning hunger-satiety cues and understanding the importance of learning a new body: The importance of mindful eating to avoid grazing behaviors    Handouts Provided Include  Phase 7 handout  Learning Style & Readiness for Change Teaching method utilized: Visual & Auditory  Demonstrated degree of understanding via: Teach Back  Readiness Level: Action Barriers to learning/adherence to lifestyle change: comprehension   RD's Notes for Next Visit Assess adherence to pt chosen goals  MONITORING & EVALUATION Dietary intake, weekly physical activity, body weight  Next Steps Patient is to follow-up PRN; call or email if any issues or weight gain occur

## 2020-11-06 ENCOUNTER — Telehealth: Payer: Self-pay | Admitting: Cardiovascular Disease

## 2020-11-06 NOTE — Telephone Encounter (Signed)
Forwarding to anti-coag pool: Please confirm that pt may receive samples of Eliquis 5 mg.  Will give pt patient assistance application and also give him [phone]-Eliquis number to call as well as they are able to assist pts in the meantime.

## 2020-11-06 NOTE — Telephone Encounter (Signed)
Patient calling back  States the Eliquis phone number is not working for him Please call to discuss

## 2020-11-06 NOTE — Telephone Encounter (Signed)
Pt arrived at office. Picked up samples for Eliquis 5 mg BID to cover him for 4 weeks.  Pt was given [phone]-Eliquis pt assistance phone number.  Pt also completed and signed pt assistance application.  Notified pt will have Dr. Fletcher Anon sign when he is back in the office and will send application.  Will also forward to Dr. Tyrell Antonio nurse to make aware.

## 2020-11-06 NOTE — Telephone Encounter (Signed)
Patient came by office - states he will wait an hour in town to check on sample status   Patient calling the office for samples of medication:   1.  What medication and dosage are you requesting samples for? Eliquis 5 MG   2.  Are you currently out of this medication? Will be out after tonight   Unable to afford med refill - will like to know if he can switch medication or have patient assistance

## 2020-11-06 NOTE — Telephone Encounter (Signed)
Qualifies for eliquis 5mg  bid samples please provide pt assistance form for pt to complete.  Form: [email or web address]

## 2020-11-07 NOTE — Telephone Encounter (Signed)
Returned call to pt.  Pt did not have correct number.  Gave pt [phone]-Eliquis number to call which is [phone]. Pt appreciative and will call this number.

## 2020-11-07 NOTE — Telephone Encounter (Signed)
Spoke with pt and corrected number given: [phone]

## 2020-11-13 ENCOUNTER — Other Ambulatory Visit: Payer: Self-pay

## 2020-11-13 ENCOUNTER — Ambulatory Visit (INDEPENDENT_AMBULATORY_CARE_PROVIDER_SITE_OTHER): Payer: Medicare Other

## 2020-11-13 DIAGNOSIS — E538 Deficiency of other specified B group vitamins: Secondary | ICD-10-CM | POA: Diagnosis not present

## 2020-11-13 MED ORDER — CYANOCOBALAMIN 1000 MCG/ML IJ SOLN
1000.0000 ug | Freq: Once | INTRAMUSCULAR | Status: AC
  Administered 2020-11-13: 1000 ug via INTRAMUSCULAR

## 2020-11-17 NOTE — Telephone Encounter (Signed)
Forms have been completed, signed, and faxed to patient assistance program. Filing in secure sample closet.

## 2020-12-02 ENCOUNTER — Other Ambulatory Visit: Payer: Self-pay | Admitting: Internal Medicine

## 2020-12-02 NOTE — Telephone Encounter (Signed)
Requested Prescriptions  Pending Prescriptions Disp Refills  . pantoprazole (PROTONIX) 40 MG tablet [Pharmacy Med Name: PANTOPRAZOLE SODIUM 40 MG DR TAB] 90 tablet 0    Sig: TAKE 1 TABLET BY MOUTH ONCE A DAY     Gastroenterology: Proton Pump Inhibitors Passed - 12/02/2020  9:22 AM      Passed - Valid encounter within last 12 months    Recent Outpatient Visits          1 month ago Acute idiopathic gout of right ankle   [facility] [place], [place] W, NP   2 months ago Acute drug-induced gout of right ankle   [facility] [place], Coralie Keens, NP   5 months ago [facility] [place], [place] W, NP   6 months ago Seasonal allergic rhinitis due to pollen   [facility], Coralie Keens, NP      Future Appointments            In 3 weeks Fletcher Anon, Mertie Clause, MD [facility], [place]

## 2020-12-20 ENCOUNTER — Ambulatory Visit (INDEPENDENT_AMBULATORY_CARE_PROVIDER_SITE_OTHER): Payer: Medicare Other

## 2020-12-20 ENCOUNTER — Other Ambulatory Visit: Payer: Self-pay

## 2020-12-20 DIAGNOSIS — E538 Deficiency of other specified B group vitamins: Secondary | ICD-10-CM

## 2020-12-20 MED ORDER — CYANOCOBALAMIN 1000 MCG/ML IJ SOLN
1000.0000 ug | Freq: Once | INTRAMUSCULAR | Status: AC
  Administered 2020-12-20: 1000 ug via INTRAMUSCULAR

## 2020-12-28 ENCOUNTER — Ambulatory Visit: Payer: Medicare Other | Admitting: Cardiovascular Disease

## 2020-12-28 ENCOUNTER — Other Ambulatory Visit: Payer: Self-pay

## 2020-12-28 NOTE — Progress Notes (Deleted)
Cardiology Office Note   Date:  12/28/2020   ID:  Richard [name], DOB [birth year], MRN [id number]  PCP:  Jearld Fenton, NP  Cardiologist:   Kathlyn Sacramento, MD   No chief complaint on file.     History of Present Illness: Richard [name] is a 60 y.o. male who presents for a follow-up visit regarding paroxysmal atrial fibrillation and preop cardiovascular evaluation for shoulder surgery.. The patient has history of normal cardiac catheterization in 2015 after an abnormal stress test, hypertension, obesity, osteoarthritis status post bilateral knee surgeries.   The patient had multiple knee surgeries starting in 2016. He developed left lower extremity DVT complicated by massive pulmonary embolism which was treated with catheter-based intervention and IVC filter placement.  He had atrial fibrillation at that time but converted to sinus rhythm. He had intermittent palpitations thought to be due to atrial fibrillation and has been maintaining in sinus rhythm with flecainide. Lifelong anticoagulation has been recommended.  He had gastric sleeve surgery in October and has lost 80 pounds since then. He developed kidney stones in November that required treatment. He is feeling better now.  He had issues with bradycardia recently with heart rate in the high 40s with mild dizziness. He slid today on the ice this morning and felt on the back of his head and his back. He is having a headache now.  Past Medical History:  Diagnosis Date   Arthritis    Arthrofibrosis of total knee arthroplasty ([place]) 05/30/2015   Bilateral pulmonary embolism ([place])    a. 03/2015 CTA: acute bilat PE; b. IVC filter placed in 2017--> removed 2018.   Chest pain    a. 2015 Abnl MV;  b. 2015 Cath: nl cors.   Chronic diastolic CHF (congestive heart failure) ([place])    a. 03/2015 Echo: EF 55-65%, poor windows, mildly dil RV.   DVT (deep venous thrombosis) ([place])    a. 03/2015 U/S: occlusive DVT w/in the distal aspect of the  Left fem vein through the L popliteal vein.   Dyspnea    On exertion   Dysrhythmia    afib    GERD (gastroesophageal reflux disease)    Headache    Hypertension    Hypertensive heart disease    Obesity    OSA (obstructive sleep apnea)    uses CPAP nightly settings at 15    PAF (paroxysmal atrial fibrillation) ([place])    Pneumonia    hx of years ago    Primary localized osteoarthritis of left knee    a. 11/2014 s/p L TKA.   Primary localized osteoarthritis of right knee    a. 02/2014 s/p R Partial Knee arthroplasty.   Stiffness of left knee    a. 12/2014 s/p manipulation under anesthesia.   Urine incontinence     Past Surgical History:  Procedure Laterality Date   APPENDECTOMY     CARDIAC CATHETERIZATION     CARPAL TUNNEL RELEASE Left 03/2018   CYSTOSCOPY W/ RETROGRADES  12/09/2019   Procedure: CYSTOSCOPY WITH RETROGRADE PYELOGRAM;  Surgeon: Royston Cowper, MD;  Location: [facility];  Service: Urology;;   CYSTOSCOPY WITH STENT PLACEMENT Right 12/09/2019   Procedure: CYSTOSCOPY WITH STENT PLACEMENT;  Surgeon: Royston Cowper, MD;  Location: [facility];  Service: Urology;  Laterality: Right;   EXAM UNDER ANESTHESIA WITH MANIPULATION OF KNEE Left 05/30/2015   Procedure: EXAM UNDER ANESTHESIA WITH MANIPULATION OF LEFT KNEE;  Surgeon: Marchia Bond, MD;  Location: [facility];  Service:  Orthopedics;  Laterality: Left;   HIATAL HERNIA REPAIR N/A 10/26/2019   Procedure: HIATAL HERNIA REPAIR;  Surgeon: Johnathan Hausen, MD;  Location[facility];  Service: General;  Laterality: N/A;   I & D KNEE WITH POLY EXCHANGE Left 10/30/2015   Procedure: IRRIGATION AND DEBRIDEMENT KNEE WITH POLY EXCHANGE PLACE SPACERS;  Surgeon: Frederik Pear, MD;  Location: [facility];  Service: Orthopedics;  Laterality: Left;  OFF ELIQUIST 3 DAYS   IVC FILTER PLACEMENT ([facility] HX)  04/13/15   IVC FILTER REMOVAL N/A 08/13/2016   Procedure: IVC Filter Removal;  Surgeon: Katha Cabal, MD;  Location: [facility];  Service:  Cardiovascular;  Laterality: N/A;   JOINT REPLACEMENT     KNEE ARTHROSCOPY Left 08/08/2015   Procedure: LEFT KNEE MANIPULATION WITH ARTHROSCOPIC LYSIS OF ADHESIONS AND ASPIRATION;  Surgeon: Marchia Bond, MD;  Location: [facility];  Service: Orthopedics;  Laterality: Left;   KNEE CLOSED REDUCTION Left 01/20/2015   Procedure: LEFT KNEE MANIPULATION;  Surgeon: Marchia Bond, MD;  Location: [facility];  Service: Orthopedics;  Laterality: Left;   LAPAROSCOPIC GASTRIC SLEEVE RESECTION N/A 10/26/2019   Procedure: LAPAROSCOPIC SLEEVE GASTRECTOMY;  Surgeon: Johnathan Hausen, MD;  [facility];  Service: General;  Laterality: N/A;   LEFT HEART CATHETERIZATION WITH CORONARY ANGIOGRAM N/A 01/12/2014   Procedure: LEFT HEART CATHETERIZATION WITH CORONARY ANGIOGRAM;  Surgeon: Jettie Booze, MD;  Location: [facility];  Service: Cardiovascular;  Laterality: N/A;   ORTHOPEDIC SURGERY Left    arthroscopy x3   PARTIAL KNEE ARTHROPLASTY Right 02/25/2014   Procedure: RIGHT KNEE ARTHROPLASTY CONDYLE AND PLATEAU MEDIAL COMPARTMENT ;  Surgeon: Johnny Bridge, MD;  Location: [facility];  Service: Orthopedics;  Laterality: Right;   PERIPHERAL VASCULAR CATHETERIZATION N/A 03/29/2015   Procedure: IVC Filter Insertion, and possible thrombectomy;  Surgeon: Katha Cabal, MD;  Location: [facility];  Service: Cardiovascular;  Laterality: N/A;   ROTATOR CUFF REPAIR Left    TOTAL KNEE ARTHROPLASTY  12/06/2014   Procedure: TOTAL KNEE ARTHROPLASTY;  Surgeon: Marchia Bond, MD;  Location: [facility];  Service: Orthopedics;;   TOTAL KNEE REVISION Left 02/08/2016   Procedure: TOTAL KNEE REVISION;  Surgeon: Frederik Pear, MD;  Location: [facility];  Service: Orthopedics;  Laterality: Left;   UPPER GI ENDOSCOPY N/A 10/26/2019   Procedure: UPPER GI ENDOSCOPY;  Surgeon: Johnathan Hausen, MD;  [facility];  Service: General;  Laterality: N/A;   URETEROSCOPY WITH HOLMIUM LASER LITHOTRIPSY Right 12/30/2019    Procedure: URETEROSCOPY WITH HOLMIUM LASER LITHOTRIPSY;  Surgeon: Royston Cowper, MD;  Location: [facility];  Service: Urology;  Laterality: Right;     Current Outpatient Medications  Medication Sig Dispense Refill   baclofen (LIORESAL) 10 MG tablet TAKE 1 TO 2 TABLETS BY MOUTH 3 TIMES DAILY AS NEEDED FOR MUSCLE SPASMS (Patient not taking: Reported on 10/05/2020) 180 each 0   colchicine 0.6 MG tablet Take 1 tablet (0.6 mg total) by mouth 2 (two) times daily as needed. 30 tablet 0   ELIQUIS 5 MG TABS tablet TAKE 1 TABLET BY MOUTH 2 TIMES DAILY 60 tablet 6   flecainide (TAMBOCOR) 100 MG tablet TAKE 1 TABLET BY MOUTH TWICE (2) DAILY 180 tablet 0   furosemide (LASIX) 20 MG tablet Take 1 tablet (20 mg total) by mouth daily. (Patient taking differently: Take 20 mg by mouth daily as needed.) 5 tablet 0   Multiple Vitamins-Minerals (MULTIVITAMIN WITH MINERALS) tablet Take 1 tablet by mouth daily.  pantoprazole (PROTONIX) 40 MG tablet TAKE 1 TABLET BY MOUTH ONCE A DAY 90 tablet 0   Sennosides-Docusate Sodium (SM STOOL SOFTENER/LAXATIVE PO) Take 8.6 mg by mouth daily.     traMADol (ULTRAM) 50 MG tablet TAKE 1 TO 2 TABLETS BY MOUTH AT BEDTIME NEEDED 30 tablet 0   No current facility-administered medications for this visit.    Allergies:   Penicillins, Vancomycin, Penicillins, and Vancomycin    Social History:  The patient  reports that he has never smoked. He has never used smokeless tobacco. He reports that he does not currently use alcohol. He reports that he does not use drugs.   Family History:  The patient's family history includes Alzheimer's disease in his paternal grandfather; Arthritis in his mother; Coronary artery disease in his father and mother; Heart disease in his paternal grandmother; Hyperlipidemia in his mother; Hypertension in his mother.    ROS:  Please see the history of present illness.   Otherwise, review of systems are positive for none.   All other systems are reviewed  and negative.    PHYSICAL EXAM: VS:  There were no vitals taken for this visit. , BMI There is no height or weight on file to calculate BMI. GEN: Well nourished, well developed, in no acute distress  HEENT: normal  Neck: no JVD, carotid bruits, or masses Cardiac: RRR; no murmurs, rubs, or gallops,no edema  Respiratory:  clear to auscultation bilaterally, normal work of breathing GI: soft, nontender, nondistended, + BS MS: no deformity or atrophy  Skin: warm and dry, no rash Neuro:  Strength and sensation are intact Psych: euthymic mood, full affect   EKG:  EKG is ordered today. The ekg ordered today demonstrates sinus bradycardia with first-degree AV block and left axis deviation.   Recent Labs: 01/17/2020: ALT 19; BUN 16; Creatinine, Ser 1.30; Hemoglobin 15.1; Platelets 178.0; Potassium 4.3; Sodium 142    Lipid Panel    Component Value Date/Time   CHOL 162 01/17/2020 1214   CHOL 170 10/31/2017 1438   TRIG 112.0 01/17/2020 1214   HDL 39.80 01/17/2020 1214   HDL 36 (L) 10/31/2017 1438   CHOLHDL 4 01/17/2020 1214   VLDL 22.4 01/17/2020 1214   LDLCALC 100 (H) 01/17/2020 1214   LDLCALC 108 (H) 10/31/2017 1438      Wt Readings from Last 3 Encounters:  10/30/20 215 lb (97.5 kg)  10/05/20 218 lb 6.4 oz (99.1 kg)  09/07/20 219 lb 9.6 oz (99.6 kg)       ASSESSMENT AND PLAN:  1.  Paroxysmal atrial fibrillation:  No evidence of recurrent arrhythmia on flecainide. He is tolerating anticoagulation with Eliquis. Given bradycardia with mild dizziness, I discontinued Toprol today.  2. history of pulmonary embolism: The patient is on lifelong anticoagulation for this as well as paroxysmal atrial fibrillation.  3. Obesity: Significant weight loss since having gastric sleeve surgery in October.  4. Status post a fall today: He reports hitting the back of his head and his back with headaches since then. Due to that and given that he is on Eliquis, we have to exclude intracranial  bleed and thus I am going to obtain a stat CT head without contrast. Even if negative, I asked him to notify us if he continues to have headache   Disposition:   FU with me in 6 months  Signed,  Kathlyn Sacramento, MD  12/28/2020 7:51 AM    [place]

## 2021-01-02 ENCOUNTER — Other Ambulatory Visit: Payer: Self-pay | Admitting: Cardiovascular Disease

## 2021-01-02 DIAGNOSIS — I2699 Other pulmonary embolism without acute cor pulmonale: Secondary | ICD-10-CM

## 2021-01-02 DIAGNOSIS — I82492 Acute embolism and thrombosis of other specified deep vein of left lower extremity: Secondary | ICD-10-CM

## 2021-01-02 DIAGNOSIS — I480 Paroxysmal atrial fibrillation: Secondary | ICD-10-CM

## 2021-01-02 DIAGNOSIS — R0789 Other chest pain: Secondary | ICD-10-CM

## 2021-01-02 NOTE — Telephone Encounter (Signed)
Prescription refill request for Eliquis received. Indication: PAF Last office visit: 02/09/20  Rod Can MD Scr: 1.30  on 01/17/20 Age:  60 Weight: 107kg  Based on above findings Eliquis 5mg  twice daily is the appropriate dose.  Refill approved x 1.  Pt is past due for labs.  Has appt with C Burge on 02/09/21.  Labs requested at that time.

## 2021-01-02 NOTE — Telephone Encounter (Signed)
Please review for Eliquis refill

## 2021-01-05 ENCOUNTER — Telehealth: Payer: Self-pay | Admitting: Internal Medicine

## 2021-01-06 NOTE — Telephone Encounter (Signed)
Med was dc'd 10/12/20   Requested Prescriptions  Refused Prescriptions Disp Refills   cyanocobalamin (,VITAMIN B-12,) 1000 MCG/ML injection [Pharmacy Med Name: CYANOCOBALAMIN 1000 MCG/ML INJ SOLN] 3 mL 1    Sig: INJECT 1 ML (1000 MCG TOTAL) INTO THE MUSCLE EVERY 30 DAYS     Off-Protocol Failed - 01/05/2021  2:31 PM      Failed - Medication not assigned to a protocol, review manually.      Passed - Valid encounter within last 12 months    Recent Outpatient Visits          3 months ago Acute idiopathic gout of right ankle   [facility] [place], [place] W, NP   4 months ago Acute drug-induced gout of right ankle   [facility] [place], Coralie Keens, NP   6 months ago [facility] [place], [place] W, NP   7 months ago Seasonal allergic rhinitis due to pollen   [facility], Coralie Keens, NP      Future Appointments            In 1 month Theora Gianotti, NP [facility], [facility]          Off-Protocol Failed - 01/05/2021  2:31 PM      Failed - Medication not assigned to a protocol, review manually.      Passed - Valid encounter within last 12 months    Recent Outpatient Visits          3 months ago Acute idiopathic gout of right ankle   [place], [place], NP   4 months ago Acute drug-induced gout of right ankle   [facility] [place], Coralie Keens, NP   6 months ago [facility] [place], [place] W, NP   7 months ago Seasonal allergic rhinitis due to pollen   [facility], Coralie Keens, NP      Future Appointments            In 1 month Sharolyn [name], Clance Boll, NP [facility], [place]

## 2021-01-16 MED ORDER — CYANOCOBALAMIN 1000 MCG/ML IJ SOLN: 1000.0000 ug | INTRAMUSCULAR | 3 refills | Status: DC

## 2021-01-16 NOTE — Telephone Encounter (Signed)
The pt was notified that the prescription was sent over to his pharmacy.

## 2021-01-16 NOTE — Telephone Encounter (Signed)
Pt wanted to know why his B12 was not sent to the pharmacy, pt has appt on 12/30 to get his injection but still has not been able to pick It up, please advise if pt needs to cancel his nurse visit.

## 2021-01-19 ENCOUNTER — Other Ambulatory Visit: Payer: Self-pay

## 2021-01-19 ENCOUNTER — Ambulatory Visit (INDEPENDENT_AMBULATORY_CARE_PROVIDER_SITE_OTHER): Payer: Medicare Other

## 2021-01-19 ENCOUNTER — Other Ambulatory Visit: Payer: Self-pay | Admitting: Internal Medicine

## 2021-01-19 DIAGNOSIS — E538 Deficiency of other specified B group vitamins: Secondary | ICD-10-CM | POA: Diagnosis not present

## 2021-01-19 MED ORDER — CYANOCOBALAMIN 1000 MCG/ML IJ SOLN
1000.0000 ug | Freq: Once | INTRAMUSCULAR | Status: AC
  Administered 2021-01-19: 08:00:00 1000 ug via INTRAMUSCULAR

## 2021-01-19 NOTE — Telephone Encounter (Signed)
Medication: cyanocobalamin (,VITAMIN B-12,) 1000 MCG/ML injection [[id number]]   Has the patient contacted their pharmacy? YES  (Agent: If no, request that the patient contact the pharmacy for the refill. If patient does not wish to contact the pharmacy document the reason why and proceed with request.) (Agent: If yes, when and what did the pharmacy advise?)  Preferred Pharmacy (with phone number or street name): [facility] - [address], [place] - [address] [place] [address] Phone: [phone] Fax: [phone] Hours: Not open 24 hours   Has the patient been seen for an appointment in the last year OR does the patient have an upcoming appointment? YES 01/19/21  Agent: Please be advised that RX refills may take up to 3 business days. We ask that you follow-up with your pharmacy.

## 2021-01-19 NOTE — Telephone Encounter (Signed)
°  Notes to clinic:  No protocol to review, however, duplicate request, just ordered 4 month supply in Dec. at same pharm.  Requested Prescriptions  Pending Prescriptions Disp Refills   cyanocobalamin (,VITAMIN B-12,) 1000 MCG/ML injection 1 mL 3    Sig: Inject 1 mL (1,000 mcg total) into the muscle every 30 (thirty) days.     Off-Protocol Failed - 01/19/2021 11:11 PM      Failed - Medication not assigned to a protocol, review manually.      Passed - Valid encounter within last 12 months    Recent Outpatient Visits           3 months ago Acute idiopathic gout of right ankle   [facility] [place], [place] W, NP   4 months ago Acute drug-induced gout of right ankle   [facility] [place], Coralie Keens, NP   7 months ago [facility] [place], [place] W, NP   8 months ago Seasonal allergic rhinitis due to pollen   [facility], Coralie Keens, NP       Future Appointments             In 3 weeks Theora Gianotti, NP [facility], [facility]           Off-Protocol Failed - 01/19/2021 11:11 PM      Failed - Medication not assigned to a protocol, review manually.      Passed - Valid encounter within last 12 months    Recent Outpatient Visits           3 months ago Acute idiopathic gout of right ankle   [place], [place], NP   4 months ago Acute drug-induced gout of right ankle   [facility] [place], Coralie Keens, NP   7 months ago [facility] [place], [place] W, NP   8 months ago Seasonal allergic rhinitis due to pollen   [facility], Coralie Keens, NP       Future Appointments             In 3 weeks Sharolyn [name], Clance Boll, NP [facility], [place]

## 2021-01-19 NOTE — Addendum Note (Signed)
Addended by: Hoyle Barr on: 01/19/2021 11:11 PM   Modules accepted: Orders

## 2021-01-19 NOTE — Progress Notes (Signed)
The pt came in today for B12 injection and to learn how to self administer the injection. He verbalize understanding of the teaching.

## 2021-01-31 ENCOUNTER — Ambulatory Visit: Payer: Medicare Other | Admitting: Nurse Practitioner

## 2021-01-31 ENCOUNTER — Encounter: Payer: Self-pay | Admitting: Nurse Practitioner

## 2021-01-31 ENCOUNTER — Other Ambulatory Visit: Payer: Self-pay

## 2021-01-31 VITALS — BP 120/70 | HR 54 | Ht 69.0 in | Wt 224.0 lb

## 2021-01-31 DIAGNOSIS — G4733 Obstructive sleep apnea (adult) (pediatric): Secondary | ICD-10-CM

## 2021-01-31 DIAGNOSIS — I10 Essential (primary) hypertension: Secondary | ICD-10-CM

## 2021-01-31 DIAGNOSIS — Z86711 Personal history of pulmonary embolism: Secondary | ICD-10-CM

## 2021-01-31 DIAGNOSIS — I480 Paroxysmal atrial fibrillation: Secondary | ICD-10-CM | POA: Diagnosis not present

## 2021-01-31 DIAGNOSIS — Z13220 Encounter for screening for lipoid disorders: Secondary | ICD-10-CM

## 2021-01-31 NOTE — Progress Notes (Signed)
Office Visit    Patient Name: Richard [name] Date of Encounter: 01/31/2021  Primary Care Provider:  Jearld Fenton, NP Primary Cardiologist:  Kathlyn Sacramento, MD  Chief Complaint    61 year old male with a history of abnormal stress test and normal coronary arteries on catheterization in 2015, paroxysmal atrial fibrillation, hypertension, obesity, left lower extremity DVT and bilateral PE on chronic Eliquis, sleep apnea, and arthritis, who presents for follow-up of PAF.  Past Medical History    Past Medical History:  Diagnosis Date   Arthritis    Arthrofibrosis of total knee arthroplasty ([place]) 05/30/2015   Bilateral pulmonary embolism ([place])    a. 03/2015 CTA: acute bilat PE; b. IVC filter placed in 2017--> removed 2018.   Chest pain    a. 2015 Abnl MV;  b. 2015 Cath: nl cors.   Chronic diastolic CHF (congestive heart failure) ([place])    a. 03/2015 Echo: EF 55-65%, poor windows, mildly dil RV.   DVT (deep venous thrombosis) ([place])    a. 03/2015 U/S: occlusive DVT w/in the distal aspect of the Left fem vein through the L popliteal vein.   Dyspnea    On exertion   GERD (gastroesophageal reflux disease)    Headache    Hypertension    Hypertensive heart disease    Obesity    OSA (obstructive sleep apnea)    uses CPAP nightly settings at 15    PAF (paroxysmal atrial fibrillation) (HCC)    Pneumonia    hx of years ago    Primary localized osteoarthritis of left knee    a. 11/2014 s/p L TKA.   Primary localized osteoarthritis of right knee    a. 02/2014 s/p R Partial Knee arthroplasty.   Stiffness of left knee    a. 12/2014 s/p manipulation under anesthesia.   Urine incontinence    Past Surgical History:  Procedure Laterality Date   APPENDECTOMY     CARDIAC CATHETERIZATION     CARPAL TUNNEL RELEASE Left 03/2018   CYSTOSCOPY W/ RETROGRADES  12/09/2019   Procedure: CYSTOSCOPY WITH RETROGRADE PYELOGRAM;  Surgeon: Royston Cowper, MD;  Location: [facility];  Service: Urology;;    CYSTOSCOPY WITH STENT PLACEMENT Right 12/09/2019   Procedure: CYSTOSCOPY WITH STENT PLACEMENT;  Surgeon: Royston Cowper, MD;  Location: [facility];  Service: Urology;  Laterality: Right;   EXAM UNDER ANESTHESIA WITH MANIPULATION OF KNEE Left 05/30/2015   Procedure: EXAM UNDER ANESTHESIA WITH MANIPULATION OF LEFT KNEE;  Surgeon: Marchia Bond, MD;  Location: [place];  Service: Orthopedics;  Laterality: Left;   HIATAL HERNIA REPAIR N/A 10/26/2019   Procedure: HIATAL HERNIA REPAIR;  Surgeon: Johnathan Hausen, MD;  [facility];  Service: General;  Laterality: N/A;   I & D KNEE WITH POLY EXCHANGE Left 10/30/2015   Procedure: IRRIGATION AND DEBRIDEMENT KNEE WITH POLY EXCHANGE PLACE SPACERS;  Surgeon: Frederik Pear, MD;  Location: [place];  Service: Orthopedics;  Laterality: Left;  OFF ELIQUIST 3 DAYS   IVC FILTER PLACEMENT ([facility] HX)  04/13/15   IVC FILTER REMOVAL N/A 08/13/2016   Procedure: IVC Filter Removal;  Surgeon: Katha Cabal, MD;  Location: [place] [facility];  Service: Cardiovascular;  Laterality: N/A;   JOINT REPLACEMENT     KNEE ARTHROSCOPY Left 08/08/2015   Procedure: LEFT KNEE MANIPULATION WITH ARTHROSCOPIC LYSIS OF ADHESIONS AND ASPIRATION;  Surgeon: Marchia Bond, MD;  Location: [place];  Service: Orthopedics;  Laterality: Left;   KNEE CLOSED REDUCTION Left 01/20/2015  Procedure: LEFT KNEE MANIPULATION;  Surgeon: Marchia Bond, MD;  Location: [place];  Service: Orthopedics;  Laterality: Left;   LAPAROSCOPIC GASTRIC SLEEVE RESECTION N/A 10/26/2019   Procedure: LAPAROSCOPIC SLEEVE GASTRECTOMY;  Surgeon: Johnathan Hausen, MD;  [facility];  Service: General;  Laterality: N/A;   LEFT HEART CATHETERIZATION WITH CORONARY ANGIOGRAM N/A 01/12/2014   Procedure: LEFT HEART CATHETERIZATION WITH CORONARY ANGIOGRAM;  Surgeon: Jettie Booze, MD;  Location: [facility];  Service: Cardiovascular;  Laterality: N/A;   ORTHOPEDIC SURGERY Left    arthroscopy x3   PARTIAL KNEE  ARTHROPLASTY Right 02/25/2014   Procedure: RIGHT KNEE ARTHROPLASTY CONDYLE AND PLATEAU MEDIAL COMPARTMENT ;  Surgeon: Johnny Bridge, MD;  Location: [place];  Service: Orthopedics;  Laterality: Right;   PERIPHERAL VASCULAR CATHETERIZATION N/A 03/29/2015   Procedure: IVC Filter Insertion, and possible thrombectomy;  Surgeon: Katha Cabal, MD;  Location: [place] [facility];  Service: Cardiovascular;  Laterality: N/A;   ROTATOR CUFF REPAIR Left    TOTAL KNEE ARTHROPLASTY  12/06/2014   Procedure: TOTAL KNEE ARTHROPLASTY;  Surgeon: Marchia Bond, MD;  Location: [place];  Service: Orthopedics;;   TOTAL KNEE REVISION Left 02/08/2016   Procedure: TOTAL KNEE REVISION;  Surgeon: Frederik Pear, MD;  Location: [place];  Service: Orthopedics;  Laterality: Left;   UPPER GI ENDOSCOPY N/A 10/26/2019   Procedure: UPPER GI ENDOSCOPY;  Surgeon: Johnathan Hausen, MD;  [facility];  Service: General;  Laterality: N/A;   URETEROSCOPY WITH HOLMIUM LASER LITHOTRIPSY Right 12/30/2019   Procedure: URETEROSCOPY WITH HOLMIUM LASER LITHOTRIPSY;  Surgeon: Royston Cowper, MD;  Location: [facility];  Service: Urology;  Laterality: Right;    Allergies  Allergies  Allergen Reactions   Penicillins Anaphylaxis and Other (See Comments)    From childhood; uncertain of reaction Has patient had a PCN reaction causing immediate rash, facial/tongue/throat swelling, SOB or lightheadedness with hypotension: Unk Has patient had a PCN reaction causing severe rash involving mucus membranes or skin necrosis: Unk Has patient had a PCN reaction that required hospitalization: Unk Has patient had a PCN reaction occurring within the last 10 years: No If all of the above answers are "NO", then may proceed with Cephalosporin use.   Vancomycin Other (See Comments)    [place]' syndrome   Penicillins Other (See Comments)    Childhood   Vancomycin Other (See Comments)    Red man Syndrome    History of Present Illness     61 year old male with the above past medical history including hypertension, diastolic dysfunction, obesity, sleep apnea, arthritis, DVT following knee surgery (history of multiple knee surgeries) complicated by bilateral PE and atrial fibrillation-now on Eliquis.  He also has a history of abnormal stress testing with normal coronary arteries by catheterization 2015.  From an A. fib standpoint, he has been maintained on flecainide and Eliquis therapy.  He last had recurrent A. fib in December 2018 for which, he was seen in the ER and treated with IV metoprolol with conversion to sinus rhythm.  In October 2021, he underwent gastric sleeve and subsequently lost 80 pounds.  Since then, he has noted significant improvement in his overall health and activity tolerances.  Mr. Alekai was last seen in clinic in January 2022, at which time he reported intermittent lightheadedness and bradycardia.  Toprol-XL was discontinued in the setting of a heart rate of 45 that day.  Patient also reported slipping on the ice and falling that day and he  subsequently underwent stat head CT which was negative.  Over the past year, Mr. Brailen has felt well.  He is reasonably active without symptoms or limitations.  He has gained about 8 or 9 pounds over the holiday season which she attributes to drinking sweet tea and being a little less active.  He denies chest pain, dyspnea, palpitations, PND, orthopnea, dizziness, syncope, edema, or early satiety.  He does have oral furosemide therapy to be used as needed but notes that he is used it maybe once in the past year.  He has had some right ankle pain and has been started on colchicine as needed for gout.  Home Medications    Current Outpatient Medications  Medication Sig Dispense Refill   apixaban (ELIQUIS) 5 MG TABS tablet TAKE 1 TABLET BY MOUTH 2 TIMES DAILY 60 tablet 1   baclofen (LIORESAL) 10 MG tablet TAKE 1 TO 2 TABLETS BY MOUTH 3 TIMES DAILY AS NEEDED FOR MUSCLE SPASMS 180  each 0   colchicine 0.6 MG tablet Take 1 tablet (0.6 mg total) by mouth 2 (two) times daily as needed. 30 tablet 0   cyanocobalamin (,VITAMIN B-12,) 1000 MCG/ML injection Inject 1 mL (1,000 mcg total) into the muscle every 30 (thirty) days. 1 mL 3   flecainide (TAMBOCOR) 100 MG tablet TAKE 1 TABLET BY MOUTH TWICE (2) DAILY 180 tablet 0   furosemide (LASIX) 20 MG tablet Take 1 tablet (20 mg total) by mouth daily. (Patient taking differently: Take 20 mg by mouth daily as needed.) 5 tablet 0   Multiple Vitamins-Minerals (MULTIVITAMIN WITH MINERALS) tablet Take 1 tablet by mouth daily.     pantoprazole (PROTONIX) 40 MG tablet TAKE 1 TABLET BY MOUTH ONCE A DAY 90 tablet 0   Sennosides-Docusate Sodium (SM STOOL SOFTENER/LAXATIVE PO) Take 8.6 mg by mouth daily.     traMADol (ULTRAM) 50 MG tablet TAKE 1 TO 2 TABLETS BY MOUTH AT BEDTIME NEEDED 30 tablet 0   No current facility-administered medications for this visit.     Review of Systems    Overall doing well.  Denies chest pain, dyspnea, palpitations, PND, orthopnea, dizziness, syncope, edema, or early satiety.  Has been dealing with right ankle pain and gout.  All other systems reviewed and are otherwise negative except as noted above.    Physical Exam    VS:  BP 120/70    Pulse (!) 54    Ht 5\' 9"  (1.753 m)    Wt 224 lb (101.6 kg)    SpO2 96%    BMI 33.08 kg/m  , BMI Body mass index is 33.08 kg/m.     GEN: Obese, in no acute distress. HEENT: normal. Neck: Supple, no JVD, carotid bruits, or masses. Cardiac: RRR, distant, no murmurs, rubs, or gallops. No clubbing, cyanosis, edema.  Radials/PT 2+ and equal bilaterally.  Respiratory:  Respirations regular and unlabored, clear to auscultation bilaterally. GI: Obese, soft, nontender, nondistended, BS + x 4. MS: no deformity or atrophy. Skin: warm and dry, no rash. Neuro:  Strength and sensation are intact. Psych: Normal affect.  Accessory Clinical Findings    ECG personally reviewed by me  today -sinus bradycardia, 54, left axis deviation, left anterior fascicular block- no acute changes.  Lab Results  Component Value Date   WBC 7.5 01/17/2020   HGB 15.1 01/17/2020   HCT 44.8 01/17/2020   MCV 90.9 01/17/2020   PLT 178.0 01/17/2020   Lab Results  Component Value Date   CREATININE 1.30 01/17/2020  BUN 16 01/17/2020   NA 142 01/17/2020   K 4.3 01/17/2020   CL 106 01/17/2020   CO2 29 01/17/2020   Lab Results  Component Value Date   ALT 19 01/17/2020   AST 20 01/17/2020   ALKPHOS 74 01/17/2020   BILITOT 0.8 01/17/2020   Lab Results  Component Value Date   CHOL 162 01/17/2020   HDL 39.80 01/17/2020   LDLCALC 100 (H) 01/17/2020   TRIG 112.0 01/17/2020   CHOLHDL 4 01/17/2020    Lab Results  Component Value Date   HGBA1C 4.9 01/17/2020    Assessment & Plan    1.  Paroxysmal atrial fibrillation: Quiescent on flecainide therapy.  He is anticoagulated with Eliquis in the setting of a CHA2DS2-VASc of 1.  Overdue for labs and will plan for follow-up CBC and basic metabolic panel.  2.  Essential hypertension: Patient is not on any antihypertensive medications and his blood pressure is stable at 120/70.  3.  History of DVT/PE: Chronic Eliquis.  Follow-up CBC.  4.  Obstructive sleep apnea: Compliant with CPAP nightly.  5.  Morbid obesity: Significant weight loss following gastric sleeve in October 2021.  Weight up 9 pounds on our scale since October 2022.  He attributes this to drinking more sweet tea and perhaps less activity.  He plans to get back into the [employer] within the next month.  6.  Disposition: Follow-up CBC, complete metabolic panel, fasting lipids, and TSH.  Follow-up in clinic in 6 months or sooner if necessary.   Murray Hodgkins, NP 01/31/2021, 12:02 PM

## 2021-01-31 NOTE — Patient Instructions (Signed)
Medication Instructions:  - Your physician recommends that you continue on your current medications as directed. Please refer to the Current Medication list given to you today.   *If you need a refill on your cardiac medications before your next appointment, please call your pharmacy*   Lab Work: - Your physician recommends that you return for FASTING lab work at your convenience:  CBC/ CMET/ Lipid  If you have labs (blood work) drawn today and your tests are completely normal, you will receive your results only by: MyChart Message (if you have MyChart) OR A paper copy in the mail If you have any lab test that is abnormal or we need to change your treatment, we will call you to review the results.   Testing/Procedures: - none ordered   Follow-Up: At [facility], you and your health needs are our priority.  As part of our continuing mission to provide you with exceptional heart care, we have created designated Provider Care Teams.  These Care Teams include your primary Cardiologist (physician) and Advanced Practice Providers (APPs -  Physician Assistants and Nurse Practitioners) who all work together to provide you with the care you need, when you need it.  We recommend signing up for the patient portal called "MyChart".  Sign up information is provided on this After Visit Summary.  MyChart is used to connect with patients for Virtual Visits (Telemedicine).  Patients are able to view lab/test results, encounter notes, upcoming appointments, etc.  Non-urgent messages can be sent to your provider as well.   To learn more about what you can do with MyChart, go to [email or web address].    Your next appointment:   6 month(s)  The format for your next appointment:   In Person  Provider:   Kathlyn Sacramento, MD    Other Instructions N/a

## 2021-02-05 ENCOUNTER — Other Ambulatory Visit (INDEPENDENT_AMBULATORY_CARE_PROVIDER_SITE_OTHER): Payer: Medicare Other

## 2021-02-05 ENCOUNTER — Other Ambulatory Visit: Payer: Self-pay

## 2021-02-05 DIAGNOSIS — I480 Paroxysmal atrial fibrillation: Secondary | ICD-10-CM

## 2021-02-05 DIAGNOSIS — Z13220 Encounter for screening for lipoid disorders: Secondary | ICD-10-CM | POA: Diagnosis not present

## 2021-02-06 LAB — COMPREHENSIVE METABOLIC PANEL
ALT: 19 IU/L (ref 0–44)
AST: 21 IU/L (ref 0–40)
Albumin/Globulin Ratio: 2 (ref 1.2–2.2)
Albumin: 4.5 g/dL (ref 3.8–4.9)
Alkaline Phosphatase: 85 IU/L (ref 44–121)
BUN/Creatinine Ratio: 14 (ref 9–20)
BUN: 16 mg/dL (ref 6–24)
Bilirubin Total: 0.5 mg/dL (ref 0.0–1.2)
CO2: 27 mmol/L (ref 20–29)
Calcium: 9.4 mg/dL (ref 8.7–10.2)
Chloride: 106 mmol/L (ref 96–106)
Creatinine, Ser: 1.15 mg/dL (ref 0.76–1.27)
Globulin, Total: 2.3 g/dL (ref 1.5–4.5)
Glucose: 77 mg/dL (ref 70–99)
Potassium: 4.3 mmol/L (ref 3.5–5.2)
Sodium: 145 mmol/L — ABNORMAL HIGH (ref 134–144)
Total Protein: 6.8 g/dL (ref 6.0–8.5)
eGFR: 73 mL/min/{1.73_m2} (ref >59)

## 2021-02-06 LAB — CBC
Hematocrit: 45.5 % (ref 37.5–51.0)
Hemoglobin: 16.2 g/dL (ref 13.0–17.7)
MCH: 31.5 pg (ref 26.6–33.0)
MCHC: 35.6 g/dL (ref 31.5–35.7)
MCV: 88 fL (ref 79–97)
Platelets: 195 10*3/uL (ref 150–450)
RBC: 5.15 x10E6/uL (ref 4.14–5.80)
RDW: 12.6 % (ref 11.6–15.4)
WBC: 5.9 10*3/uL (ref 3.4–10.8)

## 2021-02-06 LAB — LIPID PANEL
Chol/HDL Ratio: 3 ratio (ref 0.0–5.0)
Cholesterol, Total: 143 mg/dL (ref 100–199)
HDL: 48 mg/dL (ref >39)
LDL Chol Calc (NIH): 83 mg/dL (ref 0–99)
Triglycerides: 57 mg/dL (ref 0–149)
VLDL Cholesterol Cal: 12 mg/dL (ref 5–40)

## 2021-02-08 DIAGNOSIS — M9903 Segmental and somatic dysfunction of lumbar region: Secondary | ICD-10-CM | POA: Diagnosis not present

## 2021-02-08 DIAGNOSIS — M545 Low back pain, unspecified: Secondary | ICD-10-CM | POA: Diagnosis not present

## 2021-02-08 DIAGNOSIS — M62830 Muscle spasm of back: Secondary | ICD-10-CM | POA: Diagnosis not present

## 2021-02-09 ENCOUNTER — Ambulatory Visit: Payer: Medicare Other | Admitting: Nurse Practitioner

## 2021-02-09 ENCOUNTER — Other Ambulatory Visit: Payer: Self-pay

## 2021-02-13 DIAGNOSIS — M545 Low back pain, unspecified: Secondary | ICD-10-CM | POA: Diagnosis not present

## 2021-02-13 DIAGNOSIS — M62830 Muscle spasm of back: Secondary | ICD-10-CM | POA: Diagnosis not present

## 2021-02-13 DIAGNOSIS — M9903 Segmental and somatic dysfunction of lumbar region: Secondary | ICD-10-CM | POA: Diagnosis not present

## 2021-02-27 DIAGNOSIS — G4733 Obstructive sleep apnea (adult) (pediatric): Secondary | ICD-10-CM | POA: Diagnosis not present

## 2021-03-07 ENCOUNTER — Other Ambulatory Visit: Payer: Self-pay | Admitting: Cardiovascular Disease

## 2021-03-07 NOTE — Telephone Encounter (Signed)
Eliquis 5 mg refill request received. Patient is 61 years old, weight- 101.6 kg, Crea- 1.15 on 02/05/21, Diagnosis- PAF, and last seen by Ignacia Bayley, NP on 01/31/21. Dose is appropriate based on dosing criteria. Will send in refill to requested pharmacy.

## 2021-03-12 ENCOUNTER — Other Ambulatory Visit: Payer: Self-pay | Admitting: Internal Medicine

## 2021-03-13 NOTE — Telephone Encounter (Signed)
Requested Prescriptions  Pending Prescriptions Disp Refills   pantoprazole (PROTONIX) 40 MG tablet [Pharmacy Med Name: PANTOPRAZOLE SODIUM 40 MG DR TAB] 90 tablet 1    Sig: TAKE 1 TABLET BY MOUTH ONCE A DAY     Gastroenterology: Proton Pump Inhibitors Passed - 03/12/2021  3:44 PM      Passed - Valid encounter within last 12 months    Recent Outpatient Visits          5 months ago Acute idiopathic gout of right ankle   [facility] [place], [place] W, NP   6 months ago Acute drug-induced gout of right ankle   [facility] [place], Coralie Keens, NP   9 months ago [facility] [place], [place] W, NP   10 months ago Seasonal allergic rhinitis due to pollen   [facility] [place], Coralie Keens, NP

## 2021-03-26 ENCOUNTER — Other Ambulatory Visit (HOSPITAL_COMMUNITY): Payer: Self-pay

## 2021-03-27 DIAGNOSIS — G4733 Obstructive sleep apnea (adult) (pediatric): Secondary | ICD-10-CM | POA: Diagnosis not present

## 2021-03-28 ENCOUNTER — Other Ambulatory Visit: Payer: Self-pay

## 2021-04-04 ENCOUNTER — Other Ambulatory Visit: Payer: Self-pay | Admitting: Internal Medicine

## 2021-04-04 NOTE — Telephone Encounter (Signed)
Requested medications are due for refill today.  yes ? ?Requested medications are on the active medications list.  yes ? ?Last refill. 09/11/2020 #30 0 refills ? ?Future visit scheduled.   no ? ?Notes to clinic.  Medication not delegated. ? ? ? ?Requested Prescriptions  ?Pending Prescriptions Disp Refills  ? traMADol (ULTRAM) 50 MG tablet [Pharmacy Med Name: TRAMADOL HCL 50 MG TAB] 30 tablet   ?  Sig: TAKE 1 TO 2 TABLETS BY MOUTH AT BEDTIME NEEDED  ?  ? Not Delegated - Analgesics:  Opioid Agonists Failed - 04/04/2021 12:08 PM  ?  ?  Failed - This refill cannot be delegated  ?  ?  Failed - Urine Drug Screen completed in last 360 days  ?  ?  Failed - Valid encounter within last 3 months  ?  Recent Outpatient Visits   ? ?      ? 6 months ago Acute idiopathic gout of right ankle  ? [facility] [place], [place] W, NP  ? 6 months ago Acute drug-induced gout of right ankle  ? [facility] [place], Coralie Keens, NP  ? 9 months ago Poison ivy  ? [facility] [place], [place] W, NP  ? 10 months ago Seasonal allergic rhinitis due to pollen  ? [facility] [place], Coralie Keens, NP  ? ?  ?  ? ?  ?  ?  ?  ?

## 2021-04-16 ENCOUNTER — Ambulatory Visit (INDEPENDENT_AMBULATORY_CARE_PROVIDER_SITE_OTHER): Payer: Medicare Other | Admitting: Internal Medicine

## 2021-04-16 VITALS — BP 133/84 | HR 54 | Resp 18 | Ht 69.0 in | Wt 231.0 lb

## 2021-04-16 DIAGNOSIS — I5032 Chronic diastolic (congestive) heart failure: Secondary | ICD-10-CM | POA: Diagnosis not present

## 2021-04-16 DIAGNOSIS — G4733 Obstructive sleep apnea (adult) (pediatric): Secondary | ICD-10-CM | POA: Diagnosis not present

## 2021-04-16 DIAGNOSIS — Z7189 Other specified counseling: Secondary | ICD-10-CM

## 2021-04-16 DIAGNOSIS — I480 Paroxysmal atrial fibrillation: Secondary | ICD-10-CM | POA: Diagnosis not present

## 2021-04-16 DIAGNOSIS — E669 Obesity, unspecified: Secondary | ICD-10-CM

## 2021-04-16 NOTE — Patient Instructions (Signed)

## 2021-04-16 NOTE — Progress Notes (Signed)
[place] ?[address] ?[place], [place] [address] ? ?Pulmonary Sleep Medicine  ? ?Office Visit Note ? ?Patient Name: Richard [name] ?DOB: May 23, 1960 ?MRN [id number] ? ? ? ?Chief Complaint: Obstructive Sleep Apnea visit ? ?Brief History: ?  ?[name] is seen today for follow up visit. The patient has 8.5 year a  history of sleep apnea. Patient is using PAP nightly.  The patient feels  a lot better after sleeping with PAP.  The patient reports benefiting from PAP use. Epworth Sleepiness Score is 9 out of 24. The patient  does take naps 40 -45 minutes sometimes without PAP. The patient complains of the following: ongoing dry mouth despite using med F20 full face mask. Said he cannot tolerate chinstrap.   The compliance download shows  compliance with an average use time of 6:43 hours. The AHI is 1.7  The patient does not complain of limb movements disrupting sleep. ? ?ROS ? ?General: (-) fever, (-) chills, (-) night sweat ?Nose and Sinuses: (-) nasal stuffiness or itchiness, (-) postnasal drip, (-) nosebleeds, (-) sinus trouble. ?Mouth and Throat: (-) sore throat, (-) hoarseness. ?Neck: (-) swollen glands, (-) enlarged thyroid, (-) neck pain. ?Respiratory: - cough, - shortness of breath, - wheezing. ?Neurologic: - numbness, - tingling. ?Psychiatric: - anxiety, - depression ? ? ?Current Medication: ?Outpatient Encounter Medications as of 04/16/2021  ?Medication Sig  ? baclofen (LIORESAL) 10 MG tablet TAKE 1 TO 2 TABLETS BY MOUTH 3 TIMES DAILY AS NEEDED FOR MUSCLE SPASMS  ? colchicine 0.6 MG tablet Take 1 tablet (0.6 mg total) by mouth 2 (two) times daily as needed.  ? cyanocobalamin (,VITAMIN B-12,) 1000 MCG/ML injection Inject 1 mL (1,000 mcg total) into the muscle every 30 (thirty) days.  ? ELIQUIS 5 MG TABS tablet TAKE 1 TABLET BY MOUTH 2 TIMES DAILY  ? flecainide (TAMBOCOR) 100 MG tablet TAKE 1 TABLET BY MOUTH TWICE (2) DAILY  ? furosemide (LASIX) 20 MG tablet Take 1 tablet (20 mg total) by mouth  daily. (Patient taking differently: Take 20 mg by mouth daily as needed.)  ? Multiple Vitamins-Minerals (MULTIVITAMIN WITH MINERALS) tablet Take 1 tablet by mouth daily.  ? pantoprazole (PROTONIX) 40 MG tablet TAKE 1 TABLET BY MOUTH ONCE A DAY  ? Sennosides-Docusate Sodium (SM STOOL SOFTENER/LAXATIVE PO) Take 8.6 mg by mouth daily.  ? traMADol (ULTRAM) 50 MG tablet TAKE 1 TO 2 TABLETS BY MOUTH AT BEDTIME NEEDED  ? ?No facility-administered encounter medications on file as of 04/16/2021.  ? ? ?Surgical History: ?Past Surgical History:  ?Procedure Laterality Date  ? APPENDECTOMY    ? CARDIAC CATHETERIZATION    ? CARPAL TUNNEL RELEASE Left 03/2018  ? CYSTOSCOPY W/ RETROGRADES  12/09/2019  ? Procedure: CYSTOSCOPY WITH RETROGRADE PYELOGRAM;  Surgeon: Royston Cowper, MD;  Location: [facility];  Service: Urology;;  ? [place] Right 12/09/2019  ? Procedure: CYSTOSCOPY WITH STENT PLACEMENT;  Surgeon: Royston Cowper, MD;  Location: [facility];  Service: Urology;  Laterality: Right;  ? EXAM UNDER ANESTHESIA WITH MANIPULATION OF KNEE Left 05/30/2015  ? Procedure: EXAM UNDER ANESTHESIA WITH MANIPULATION OF LEFT KNEE;  Surgeon: Marchia Bond, MD;  Location: [place];  Service: Orthopedics;  Laterality: Left;  ? HIATAL HERNIA REPAIR N/A 10/26/2019  ? Procedure: HIATAL HERNIA REPAIR;  Surgeon: Johnathan Hausen, MD;  [facility];  Service: General;  Laterality: N/A;  ? I & D KNEE WITH POLY EXCHANGE Left 10/30/2015  ? Procedure: IRRIGATION AND DEBRIDEMENT KNEE WITH  POLY EXCHANGE PLACE SPACERS;  Surgeon: Frederik Pear, MD;  Location: [place];  Service: Orthopedics;  Laterality: Left;  OFF ELIQUIST 3 DAYS  ? IVC FILTER PLACEMENT ([facility] HX)  04/13/15  ? IVC FILTER REMOVAL N/A 08/13/2016  ? Procedure: IVC Filter Removal;  Surgeon: Katha Cabal, MD;  Location: [place] [facility];  Service: Cardiovascular;  Laterality: N/A;  ? JOINT REPLACEMENT    ? KNEE ARTHROSCOPY Left 08/08/2015  ? Procedure: LEFT KNEE MANIPULATION  WITH ARTHROSCOPIC LYSIS OF ADHESIONS AND ASPIRATION;  Surgeon: Marchia Bond, MD;  Location: [place];  Service: Orthopedics;  Laterality: Left;  ? KNEE CLOSED REDUCTION Left 01/20/2015  ? Procedure: LEFT KNEE MANIPULATION;  Surgeon: Marchia Bond, MD;  Location: [place];  Service: Orthopedics;  Laterality: Left;  ? LAPAROSCOPIC GASTRIC SLEEVE RESECTION N/A 10/26/2019  ? Procedure: LAPAROSCOPIC SLEEVE GASTRECTOMY;  Surgeon: Johnathan Hausen, MD;  [facility];  Service: General;  Laterality: N/A;  ? LEFT HEART CATHETERIZATION WITH CORONARY ANGIOGRAM N/A 01/12/2014  ? Procedure: LEFT HEART CATHETERIZATION WITH CORONARY ANGIOGRAM;  Surgeon: Jettie Booze, MD;  Location: [facility];  Service: Cardiovascular;  Laterality: N/A;  ? ORTHOPEDIC SURGERY Left   ? arthroscopy x3  ? PARTIAL KNEE ARTHROPLASTY Right 02/25/2014  ? Procedure: RIGHT KNEE ARTHROPLASTY CONDYLE AND PLATEAU MEDIAL COMPARTMENT ;  Surgeon: Johnny Bridge, MD;  Location: [place];  Service: Orthopedics;  Laterality: Right;  ? PERIPHERAL VASCULAR CATHETERIZATION N/A 03/29/2015  ? Procedure: IVC Filter Insertion, and possible thrombectomy;  Surgeon: Katha Cabal, MD;  Location: [place] [facility];  Service: Cardiovascular;  Laterality: N/A;  ? ROTATOR CUFF REPAIR Left   ? TOTAL KNEE ARTHROPLASTY  12/06/2014  ? Procedure: TOTAL KNEE ARTHROPLASTY;  Surgeon: Marchia Bond, MD;  Location: [place];  Service: Orthopedics;;  ? TOTAL KNEE REVISION Left 02/08/2016  ? Procedure: TOTAL KNEE REVISION;  Surgeon: Frederik Pear, MD;  Location: [place];  Service: Orthopedics;  Laterality: Left;  ? UPPER GI ENDOSCOPY N/A 10/26/2019  ? Procedure: UPPER GI ENDOSCOPY;  Surgeon: Johnathan Hausen, MD;  [facility];  Service: General;  Laterality: N/A;  ? URETEROSCOPY WITH HOLMIUM LASER LITHOTRIPSY Right 12/30/2019  ? Procedure: URETEROSCOPY WITH HOLMIUM LASER LITHOTRIPSY;  Surgeon: Royston Cowper, MD;  Location: [facility];  Service:  Urology;  Laterality: Right;  ? ? ?Medical History: ?Past Medical History:  ?Diagnosis Date  ? Arthritis   ? Arthrofibrosis of total knee arthroplasty ([place]) 05/30/2015  ? Bilateral pulmonary embolism (HCC)   ? a. 03/2015 CTA: acute bilat PE; b. IVC filter placed in 2017--> removed 2018.  ? Chest pain   ? a. 2015 Abnl MV;  b. 2015 Cath: nl cors.  ? Chronic diastolic CHF (congestive heart failure) ([place])   ? a. 03/2015 Echo: EF 55-65%, poor windows, mildly dil RV.  ? DVT (deep venous thrombosis) ([place])   ? a. 03/2015 U/S: occlusive DVT w/in the distal aspect of the Left fem vein through the L popliteal vein.  ? Dyspnea   ? On exertion  ? GERD (gastroesophageal reflux disease)   ? Headache   ? Hypertension   ? Hypertensive heart disease   ? Obesity   ? OSA (obstructive sleep apnea)   ? uses CPAP nightly settings at 15   ? PAF (paroxysmal atrial fibrillation) ([place])   ? Pneumonia   ? hx of years ago   ? Primary localized osteoarthritis of left knee   ? a. 11/2014 s/p  L TKA.  ? Primary localized osteoarthritis of right knee   ? a. 02/2014 s/p R Partial Knee arthroplasty.  ? Stiffness of left knee   ? a. 12/2014 s/p manipulation under anesthesia.  ? Urine incontinence   ? ? ?Family History: ?Non contributory to the present illness ? ?Social History: ?Social History  ? ?Socioeconomic History  ? Marital status: Married  ?  Spouse name: Not on file  ? Number of children: 2  ? Years of education: 66  ? Highest education level: Not on file  ?Occupational History  ? Occupation: tiler  ?  Employer: [employer]  ?Tobacco Use  ? Smoking status: Never  ? Smokeless tobacco: Never  ?Vaping Use  ? Vaping Use: Never used  ?Substance and Sexual Activity  ? Alcohol use: Not Currently  ?  Comment: \  ? Drug use: No  ? Sexual activity: Yes  ?Other Topics Concern  ? Not on file  ?Social History Narrative  ? ** Merged History Encounter **  ?    ? ?Social Determinants of Health  ? ?Financial Resource Strain: Not on file  ?Food Insecurity: Not on file   ?Transportation Needs: Not on file  ?Physical Activity: Not on file  ?Stress: Not on file  ?Social Connections: Not on file  ?Intimate Partner Violence: Not on file  ? ? ?Vital Signs: ?Blood pressure 133/84,

## 2021-04-23 ENCOUNTER — Other Ambulatory Visit: Payer: Self-pay | Admitting: Cardiovascular Disease

## 2021-04-23 DIAGNOSIS — I82492 Acute embolism and thrombosis of other specified deep vein of left lower extremity: Secondary | ICD-10-CM

## 2021-04-23 DIAGNOSIS — I2699 Other pulmonary embolism without acute cor pulmonale: Secondary | ICD-10-CM

## 2021-04-23 DIAGNOSIS — I480 Paroxysmal atrial fibrillation: Secondary | ICD-10-CM

## 2021-04-23 DIAGNOSIS — R0789 Other chest pain: Secondary | ICD-10-CM

## 2021-04-27 DIAGNOSIS — G4733 Obstructive sleep apnea (adult) (pediatric): Secondary | ICD-10-CM | POA: Diagnosis not present

## 2021-05-07 ENCOUNTER — Ambulatory Visit
Admission: RE | Admit: 2021-05-07 | Discharge: 2021-05-07 | Disposition: A | Discharge status: Home / Self Care | Payer: Medicare Other | Source: Ambulatory Visit | Attending: Internal Medicine | Admitting: Internal Medicine

## 2021-05-07 ENCOUNTER — Ambulatory Visit
Admission: RE | Admit: 2021-05-07 | Discharge: 2021-05-07 | Disposition: A | Discharge status: Home / Self Care | Payer: Medicare Other | Source: Home / Self Care | Attending: Internal Medicine | Admitting: Internal Medicine

## 2021-05-07 ENCOUNTER — Encounter: Payer: Self-pay | Admitting: Internal Medicine

## 2021-05-07 ENCOUNTER — Ambulatory Visit (INDEPENDENT_AMBULATORY_CARE_PROVIDER_SITE_OTHER): Payer: Medicare Other

## 2021-05-07 ENCOUNTER — Other Ambulatory Visit: Payer: Self-pay | Admitting: Cardiovascular Disease

## 2021-05-07 ENCOUNTER — Ambulatory Visit (INDEPENDENT_AMBULATORY_CARE_PROVIDER_SITE_OTHER): Payer: Medicare Other | Admitting: Internal Medicine

## 2021-05-07 VITALS — BP 132/72 | HR 62 | Temp 97.3°F | Ht 69.0 in | Wt 230.0 lb

## 2021-05-07 DIAGNOSIS — M25552 Pain in left hip: Secondary | ICD-10-CM

## 2021-05-07 DIAGNOSIS — W540XXA Bitten by dog, initial encounter: Secondary | ICD-10-CM | POA: Diagnosis not present

## 2021-05-07 DIAGNOSIS — Z23 Encounter for immunization: Secondary | ICD-10-CM | POA: Diagnosis not present

## 2021-05-07 DIAGNOSIS — Z6833 Body mass index (BMI) 33.0-33.9, adult: Secondary | ICD-10-CM

## 2021-05-07 DIAGNOSIS — Z0000 Encounter for general adult medical examination without abnormal findings: Secondary | ICD-10-CM | POA: Diagnosis not present

## 2021-05-07 DIAGNOSIS — S51852A Open bite of left forearm, initial encounter: Secondary | ICD-10-CM | POA: Diagnosis not present

## 2021-05-07 DIAGNOSIS — E538 Deficiency of other specified B group vitamins: Secondary | ICD-10-CM

## 2021-05-07 DIAGNOSIS — Z125 Encounter for screening for malignant neoplasm of prostate: Secondary | ICD-10-CM

## 2021-05-07 DIAGNOSIS — E6609 Other obesity due to excess calories: Secondary | ICD-10-CM | POA: Insufficient documentation

## 2021-05-07 DIAGNOSIS — Z0001 Encounter for general adult medical examination with abnormal findings: Secondary | ICD-10-CM | POA: Diagnosis not present

## 2021-05-07 DIAGNOSIS — E785 Hyperlipidemia, unspecified: Secondary | ICD-10-CM | POA: Diagnosis not present

## 2021-05-07 DIAGNOSIS — E66812 Obesity, class 2: Secondary | ICD-10-CM | POA: Insufficient documentation

## 2021-05-07 NOTE — Telephone Encounter (Signed)
Refill request

## 2021-05-07 NOTE — Addendum Note (Signed)
Addended by: Wilson Singer on: 05/07/2021 01:59 PM ? ? Modules accepted: Orders ? ?

## 2021-05-07 NOTE — Progress Notes (Signed)
? ?Subjective:  ? ? Patient ID: Richard [name], male    DOB: 27-Jul-1960, 61 y.o.   MRN: [id number] ? ?HPI ? ?Pt presents to the clinic today for his annual exam. ? ?Flu: 12/2020 ?Tetanus: 01/2009 ?Covid: Moderna x 2 ?Shingrix: never ?PSA screening: 12/2019 ?Colon screening: 01/2020, Cologuard  ?Vision screening: as needed ?Dentist: as needed ? ?Diet: He does eat meat. He consumes fruits and veggies. He tries to avoid fried foods. He drinks mostly water, sweet tea ?Exercise: Walking ? ?Review of Systems ? ?   ?Past Medical History:  ?Diagnosis Date  ? Arthritis   ? Arthrofibrosis of total knee arthroplasty ([place]) 05/30/2015  ? Bilateral pulmonary embolism ([facility])   ? a. 03/2015 CTA: acute bilat PE; b. IVC filter placed in 2017--> removed 2018.  ? Chest pain   ? a. 2015 Abnl MV;  b. 2015 Cath: nl cors.  ? Chronic diastolic CHF (congestive heart failure) ([facility])   ? a. 03/2015 Echo: EF 55-65%, poor windows, mildly dil RV.  ? DVT (deep venous thrombosis) ([place])   ? a. 03/2015 U/S: occlusive DVT w/in the distal aspect of the Left fem vein through the L popliteal vein.  ? Dyspnea   ? On exertion  ? GERD (gastroesophageal reflux disease)   ? Headache   ? Hypertension   ? Hypertensive heart disease   ? Obesity   ? OSA (obstructive sleep apnea)   ? uses CPAP nightly settings at 15   ? PAF (paroxysmal atrial fibrillation) ([place])   ? Pneumonia   ? hx of years ago   ? Primary localized osteoarthritis of left knee   ? a. 11/2014 s/p L TKA.  ? Primary localized osteoarthritis of right knee   ? a. 02/2014 s/p R Partial Knee arthroplasty.  ? Stiffness of left knee   ? a. 12/2014 s/p manipulation under anesthesia.  ? Urine incontinence   ? ? ?Current Outpatient Medications  ?Medication Sig Dispense Refill  ? baclofen (LIORESAL) 10 MG tablet TAKE 1 TO 2 TABLETS BY MOUTH 3 TIMES DAILY AS NEEDED FOR MUSCLE SPASMS 180 each 0  ? colchicine 0.6 MG tablet Take 1 tablet (0.6 mg total) by mouth 2 (two) times daily as needed. 30 tablet 0  ?  cyanocobalamin (,VITAMIN B-12,) 1000 MCG/ML injection Inject 1 mL (1,000 mcg total) into the muscle every 30 (thirty) days. 1 mL 3  ? ELIQUIS 5 MG TABS tablet TAKE 1 TABLET BY MOUTH 2 TIMES DAILY 60 tablet 1  ? flecainide (TAMBOCOR) 100 MG tablet TAKE 1 TABLET BY MOUTH TWICE (2) DAILY 180 tablet 0  ? furosemide (LASIX) 20 MG tablet Take 1 tablet (20 mg total) by mouth daily. (Patient taking differently: Take 20 mg by mouth daily as needed.) 5 tablet 0  ? Multiple Vitamins-Minerals (MULTIVITAMIN WITH MINERALS) tablet Take 1 tablet by mouth daily.    ? pantoprazole (PROTONIX) 40 MG tablet TAKE 1 TABLET BY MOUTH ONCE A DAY 90 tablet 1  ? Sennosides-Docusate Sodium (SM STOOL SOFTENER/LAXATIVE PO) Take 8.6 mg by mouth daily.    ? traMADol (ULTRAM) 50 MG tablet TAKE 1 TO 2 TABLETS BY MOUTH AT BEDTIME NEEDED 30 tablet 0  ? ?No current facility-administered medications for this visit.  ? ? ?Allergies  ?Allergen Reactions  ? Penicillins Anaphylaxis and Other (See Comments)  ?  From childhood; uncertain of reaction ?Has patient had a PCN reaction causing immediate rash, facial/tongue/throat swelling, SOB or lightheadedness with hypotension: Unk ?Has patient had a PCN  reaction causing severe rash involving mucus membranes or skin necrosis: Unk ?Has patient had a PCN reaction that required hospitalization: Unk ?Has patient had a PCN reaction occurring within the last 10 years: No ?If all of the above answers are "NO", then may proceed with Cephalosporin use.  ? Vancomycin Other (See Comments)  ?  Red Mans' syndrome  ? Penicillins Other (See Comments)  ?  Childhood  ? Vancomycin Other (See Comments)  ?  Red man Syndrome  ? ? ?Family History  ?Problem Relation Age of Onset  ? Coronary artery disease Mother   ? Hyperlipidemia Mother   ? Hypertension Mother   ? Arthritis Mother   ? Coronary artery disease Father   ? Heart disease Paternal Grandmother   ? Alzheimer's disease Paternal Grandfather   ? ? ?Social History   ? ?Socioeconomic History  ? Marital status: Married  ?  Spouse name: Not on file  ? Number of children: 2  ? Years of education: 18  ? Highest education level: Not on file  ?Occupational History  ? Occupation: tiler  ?  Employer: [employer]  ?Tobacco Use  ? Smoking status: Never  ? Smokeless tobacco: Never  ?Vaping Use  ? Vaping Use: Never used  ?Substance and Sexual Activity  ? Alcohol use: Not Currently  ?  Comment: \  ? Drug use: No  ? Sexual activity: Yes  ?Other Topics Concern  ? Not on file  ?Social History Narrative  ? ** Merged History Encounter **  ?    ? ?Social Determinants of Health  ? ?Financial Resource Strain: Not on file  ?Food Insecurity: Not on file  ?Transportation Needs: Not on file  ?Physical Activity: Not on file  ?Stress: Not on file  ?Social Connections: Not on file  ?Intimate Partner Violence: Not on file  ? ? ? ?Constitutional: Denies fever, malaise, fatigue, headache or abrupt weight changes.  ?HEENT: Denies eye pain, eye redness, ear pain, ringing in the ears, wax buildup, runny nose, nasal congestion, bloody nose, or sore throat. ?Respiratory: Denies difficulty breathing, shortness of breath, cough or sputum production.   ?Cardiovascular: Denies chest pain, chest tightness, palpitations or swelling in the hands or feet.  ?Gastrointestinal: Denies abdominal pain, bloating, constipation, diarrhea or blood in the stool.  ?GU: Denies urgency, frequency, pain with urination, burning sensation, blood in urine, odor or discharge. ?Musculoskeletal: Pt reports joint pain, left hip pain. Denies decrease in range of motion, difficulty with gait, muscle pain or joint swelling.  ?Skin: Pt reports dog bite of left forearm. Denies redness, rashes, lesions or ulcercations.  ?Neurological: Denies dizziness, difficulty with memory, difficulty with speech or problems with balance and coordination.  ?Psych: Denies anxiety, depression, SI/HI. ? ?No other specific complaints in a complete review of systems  (except as listed in HPI above). ? ?Objective:  ? Physical Exam ? ?BP 132/72 (BP Location: Left Arm, Patient Position: Sitting, Cuff Size: Large)   Pulse 62   Temp (!) 97.3 ?F (36.3 ?C) (Temporal)   Ht '5\' 9"'$  (1.753 m)   Wt 230 lb (104.3 kg)   SpO2 99%   BMI 33.97 kg/m?  ? ?Wt Readings from Last 3 Encounters:  ?04/16/21 231 lb (104.8 kg)  ?01/31/21 224 lb (101.6 kg)  ?10/30/20 215 lb (97.5 kg)  ? ? ?General: Appears his stated age, obese, in NAD. ?Skin: Warm, dry.  He has a puncture wound to the left forearm from dog bite yesterday. ?HEENT: Head: normal shape and size;  Eyes: sclera white, no icterus, conjunctiva pink, PERRLA and EOMs intact;  ?Neck:  Neck supple, trachea midline. No masses, lumps or thyromegaly present.  ?Cardiovascular: Normal rate and rhythm. S1,S2 noted.  No murmur, rubs or gallops noted. No JVD or BLE edema. No carotid bruits noted. ?Pulmonary/Chest: Normal effort and positive vesicular breath sounds. No respiratory distress. No wheezes, rales or ronchi noted.  ?Abdomen: Soft and nontender. Normal bowel sounds.  ?Musculoskeletal: Pain with internal and external rotation of the left hip.  Pain with palpation of the left groin.  Strength 5/5 BUE/BLE.  No difficulty with gait.  ?Neurological: Alert and oriented. Cranial nerves II-XII grossly intact. Coordination normal.  ?Psychiatric: Mood and affect normal. Behavior is normal. Judgment and thought content normal.  ? ? ?BMET ?   ?Component Value Date/Time  ? NA 145 (H) 02/05/2021 6578  ? K 4.3 02/05/2021 0909  ? CL 106 02/05/2021 0909  ? CO2 27 02/05/2021 0909  ? GLUCOSE 77 02/05/2021 0909  ? GLUCOSE 83 01/17/2020 1214  ? BUN 16 02/05/2021 0909  ? CREATININE 1.15 02/05/2021 0909  ? CREATININE 1.27 12/06/2015 1600  ? CALCIUM 9.4 02/05/2021 0909  ? GFRNONAA >60 12/26/2019 0123  ? GFRAA >60 10/19/2019 1337  ? GFRAA >60 10/19/2019 1337  ? ? ?Lipid Panel  ?   ?Component Value Date/Time  ? CHOL 143 02/05/2021 0909  ? TRIG 57 02/05/2021 0909  ? HDL  48 02/05/2021 0909  ? CHOLHDL 3.0 02/05/2021 0909  ? CHOLHDL 4 01/17/2020 1214  ? VLDL 22.4 01/17/2020 1214  ? [place] 83 02/05/2021 0909  ? ? ?CBC ?   ?Component Value Date/Time  ? WBC 5.9 02/05/2021 0909  ? WBC 7.5 01/17/2020 1214  ?

## 2021-05-07 NOTE — Telephone Encounter (Signed)
Prescription refill request for Eliquis received. ?Indication: PAF ?Last office visit: 01/31/21  Laban Emperor NP ?Scr: 1.15 on 02/05/21 ?Age: 61 ?Weight: 101.6kg ? ?Based on above findings Eliquis '5mg'$  twice daily is the appropriate dose.  Refill approved. ? ?

## 2021-05-07 NOTE — Patient Instructions (Signed)
Fall Prevention in the Home, Adult ?Falls can cause injuries and can happen to people of all ages. There are many things you can do to make your home safe and to help prevent falls. Ask for help when making these changes. ?What actions can I take to prevent falls? ?General Instructions ?Use good lighting in all rooms. Replace any light bulbs that burn out. ?Turn on the lights in dark areas. Use night-lights. ?Keep items that you use often in easy-to-reach places. Lower the shelves around your home if needed. ?Set up your furniture so you have a clear path. Avoid moving your furniture around. ?Do not have throw rugs or other things on the floor that can make you trip. ?Avoid walking on wet floors. ?If any of your floors are uneven, fix them. ?Add color or contrast paint or tape to clearly mark and help you see: ?Grab bars or handrails. ?First and last steps of staircases. ?Where the edge of each step is. ?If you use a stepladder: ?Make sure that it is fully opened. Do not climb a closed stepladder. ?Make sure the sides of the stepladder are locked in place. ?Ask someone to hold the stepladder while you use it. ?Know where your pets are when moving through your home. ?What can I do in the bathroom? ? ?  ? ?Keep the floor dry. Clean up any water on the floor right away. ?Remove soap buildup in the tub or shower. ?Use nonskid mats or decals on the floor of the tub or shower. ?Attach bath mats securely with double-sided, nonslip rug tape. ?If you need to sit down in the shower, use a plastic, nonslip stool. ?Install grab bars by the toilet and in the tub and shower. Do not use towel bars as grab bars. ?What can I do in the bedroom? ?Make sure that you have a light by your bed that is easy to reach. ?Do not use any sheets or blankets for your bed that hang to the floor. ?Have a firm chair with side arms that you can use for support when you get dressed. ?What can I do in the kitchen? ?Clean up any spills right away. ?If  you need to reach something above you, use a step stool with a grab bar. ?Keep electrical cords out of the way. ?Do not use floor polish or wax that makes floors slippery. ?What can I do with my stairs? ?Do not leave any items on the stairs. ?Make sure that you have a light switch at the top and the bottom of the stairs. ?Make sure that there are handrails on both sides of the stairs. Fix handrails that are broken or loose. ?Install nonslip stair treads on all your stairs. ?Avoid having throw rugs at the top or bottom of the stairs. ?Choose a carpet that does not hide the edge of the steps on the stairs. ?Check carpeting to make sure that it is firmly attached to the stairs. Fix carpet that is loose or worn. ?What can I do on the outside of my home? ?Use bright outdoor lighting. ?Fix the edges of walkways and driveways and fix any cracks. ?Remove anything that might make you trip as you walk through a door, such as a raised step or threshold. ?Trim any bushes or trees on paths to your home. ?Check to see if handrails are loose or broken and that both sides of all steps have handrails. ?Install guardrails along the edges of any raised decks and porches. ?Clear paths of anything  that can make you trip, such as tools or rocks. ?Have leaves, snow, or ice cleared regularly. ?Use sand or salt on paths during winter. ?Clean up any spills in your garage right away. This includes grease or oil spills. ?What other actions can I take? ?Wear shoes that: ?Have a low heel. Do not wear high heels. ?Have rubber bottoms. ?Feel good on your feet and fit well. ?Are closed at the toe. Do not wear open-toe sandals. ?Use tools that help you move around if needed. These include: ?Canes. ?Walkers. ?Scooters. ?Crutches. ?Review your medicines with your doctor. Some medicines can make you feel dizzy. This can increase your chance of falling. ?Ask your doctor what else you can do to help prevent falls. ?Where to find more information ?Centers  for Disease Control and Prevention, [employer]: http://www.wolf.info/ ?[employer] on Aging: http://kim-miller.com/ ?Contact a doctor if: ?You are afraid of falling at home. ?You feel weak, drowsy, or dizzy at home. ?You fall at home. ?Summary ?There are many simple things that you can do to make your home safe and to help prevent falls. ?Ways to make your home safe include removing things that can make you trip and installing grab bars in the bathroom. ?Ask for help when making these changes in your home. ?This information is not intended to replace advice given to you by your health care provider. Make sure you discuss any questions you have with your health care provider. ?Document Revised: 10/09/2020 Document Reviewed: 08/11/2019 ?Elsevier Patient Education ? [place]. ? ?Health Maintenance, Male ?Adopting a healthy lifestyle and getting preventive care are important in promoting health and wellness. Ask your health care provider about: ?The right schedule for you to have regular tests and exams. ?Things you can do on your own to prevent diseases and keep yourself healthy. ?What should I know about diet, weight, and exercise? ?Eat a healthy diet ? ?Eat a diet that includes plenty of vegetables, fruits, low-fat dairy products, and lean protein. ?Do not eat a lot of foods that are high in solid fats, added sugars, or sodium. ?Maintain a healthy weight ?Body mass index (BMI) is a measurement that can be used to identify possible weight problems. It estimates body fat based on height and weight. Your health care provider can help determine your BMI and help you achieve or maintain a healthy weight. ?Get regular exercise ?Get regular exercise. This is one of the most important things you can do for your health. Most adults should: ?Exercise for at least 150 minutes each week. The exercise should increase your heart rate and make you sweat (moderate-intensity exercise). ?Do strengthening exercises at least twice a week.  This is in addition to the moderate-intensity exercise. ?Spend less time sitting. Even light physical activity can be beneficial. ?Watch cholesterol and blood lipids ?Have your blood tested for lipids and cholesterol at 59 years of age, then have this test every 5 years. ?You may need to have your cholesterol levels checked more often if: ?Your lipid or cholesterol levels are high. ?You are older than 60 years of age. ?You are at high risk for heart disease. ?What should I know about cancer screening? ?Many types of cancers can be detected early and may often be prevented. Depending on your health history and family history, you may need to have cancer screening at various ages. This may include screening for: ?Colorectal cancer. ?Prostate cancer. ?Skin cancer. ?Lung cancer. ?What should I know about heart disease, diabetes, and high blood pressure? ?Blood  pressure and heart disease ?High blood pressure causes heart disease and increases the risk of stroke. This is more likely to develop in people who have high blood pressure readings or are overweight. ?Talk with your health care provider about your target blood pressure readings. ?Have your blood pressure checked: ?Every 3-5 years if you are 26-55 years of age. ?Every year if you are 18 years old or older. ?If you are between the ages of 67 and 6 and are a current or former smoker, ask your health care provider if you should have a one-time screening for abdominal aortic aneurysm (AAA). ?Diabetes ?Have regular diabetes screenings. This checks your fasting blood sugar level. Have the screening done: ?Once every three years after age 32 if you are at a normal weight and have a low risk for diabetes. ?More often and at a younger age if you are overweight or have a high risk for diabetes. ?What should I know about preventing infection? ?Hepatitis B ?If you have a higher risk for hepatitis B, you should be screened for this virus. Talk with your health care provider  to find out if you are at risk for hepatitis B infection. ?Hepatitis C ?Blood testing is recommended for: ?Everyone born from [birth year] through 1964. ?Anyone with known risk factors for hepatitis C. ?Sexually

## 2021-05-07 NOTE — Patient Instructions (Signed)
Health Maintenance, Male Adopting a healthy lifestyle and getting preventive care are important in promoting health and wellness. Ask your health care provider about: The right schedule for you to have regular tests and exams. Things you can do on your own to prevent diseases and keep yourself healthy. What should I know about diet, weight, and exercise? Eat a healthy diet  Eat a diet that includes plenty of vegetables, fruits, low-fat dairy products, and lean protein. Do not eat a lot of foods that are high in solid fats, added sugars, or sodium. Maintain a healthy weight Body mass index (BMI) is a measurement that can be used to identify possible weight problems. It estimates body fat based on height and weight. Your health care provider can help determine your BMI and help you achieve or maintain a healthy weight. Get regular exercise Get regular exercise. This is one of the most important things you can do for your health. Most adults should: Exercise for at least 150 minutes each week. The exercise should increase your heart rate and make you sweat (moderate-intensity exercise). Do strengthening exercises at least twice a week. This is in addition to the moderate-intensity exercise. Spend less time sitting. Even light physical activity can be beneficial. Watch cholesterol and blood lipids Have your blood tested for lipids and cholesterol at 60 years of age, then have this test every 5 years. You may need to have your cholesterol levels checked more often if: Your lipid or cholesterol levels are high. You are older than 60 years of age. You are at high risk for heart disease. What should I know about cancer screening? Many types of cancers can be detected early and may often be prevented. Depending on your health history and family history, you may need to have cancer screening at various ages. This may include screening for: Colorectal cancer. Prostate cancer. Skin cancer. Lung  cancer. What should I know about heart disease, diabetes, and high blood pressure? Blood pressure and heart disease High blood pressure causes heart disease and increases the risk of stroke. This is more likely to develop in people who have high blood pressure readings or are overweight. Talk with your health care provider about your target blood pressure readings. Have your blood pressure checked: Every 3-5 years if you are 18-39 years of age. Every year if you are 40 years old or older. If you are between the ages of 65 and 75 and are a current or former smoker, ask your health care provider if you should have a one-time screening for abdominal aortic aneurysm (AAA). Diabetes Have regular diabetes screenings. This checks your fasting blood sugar level. Have the screening done: Once every three years after age 45 if you are at a normal weight and have a low risk for diabetes. More often and at a younger age if you are overweight or have a high risk for diabetes. What should I know about preventing infection? Hepatitis B If you have a higher risk for hepatitis B, you should be screened for this virus. Talk with your health care provider to find out if you are at risk for hepatitis B infection. Hepatitis C Blood testing is recommended for: Everyone born from 1945 through 1965. Anyone with known risk factors for hepatitis C. Sexually transmitted infections (STIs) You should be screened each year for STIs, including gonorrhea and chlamydia, if: You are sexually active and are younger than 60 years of age. You are older than 60 years of age and your   health care provider tells you that you are at risk for this type of infection. Your sexual activity has changed since you were last screened, and you are at increased risk for chlamydia or gonorrhea. Ask your health care provider if you are at risk. Ask your health care provider about whether you are at high risk for HIV. Your health care provider  may recommend a prescription medicine to help prevent HIV infection. If you choose to take medicine to prevent HIV, you should first get tested for HIV. You should then be tested every 3 months for as long as you are taking the medicine. Follow these instructions at home: Alcohol use Do not drink alcohol if your health care provider tells you not to drink. If you drink alcohol: Limit how much you have to 0-2 drinks a day. Know how much alcohol is in your drink. In the [place] one drink equals one 12 oz bottle of beer (355 mL), one 5 oz glass of wine (148 mL), or one 1 oz glass of hard liquor (44 mL). Lifestyle Do not use any products that contain nicotine or tobacco. These products include cigarettes, chewing tobacco, and vaping devices, such as e-cigarettes. If you need help quitting, ask your health care provider. Do not use street drugs. Do not share needles. Ask your health care provider for help if you need support or information about quitting drugs. General instructions Schedule regular health, dental, and eye exams. Stay current with your vaccines. Tell your health care provider if: You often feel depressed. You have ever been abused or do not feel safe at home. Summary Adopting a healthy lifestyle and getting preventive care are important in promoting health and wellness. Follow your health care provider's instructions about healthy diet, exercising, and getting tested or screened for diseases. Follow your health care provider's instructions on monitoring your cholesterol and blood pressure. This information is not intended to replace advice given to you by your health care provider. Make sure you discuss any questions you have with your health care provider. Document Revised: 05/29/2020 Document Reviewed: 05/29/2020 Elsevier Patient Education  2023 [employer]  

## 2021-05-07 NOTE — Assessment & Plan Note (Signed)
Encouraged diet and exercise for weight loss ?

## 2021-05-07 NOTE — Progress Notes (Signed)
? ?Subjective:  ? Richard [name] is a 61 y.o. male who presents for Medicare Annual/Subsequent preventive examination. ? ?Review of Systems    ?Per HPI unless specifically indicated below  ?  ? ?   ?Objective:  ?  ?Today's Vitals  ? 05/07/21 1340  ?PainSc: 7   ? ?There is no height or weight on file to calculate BMI. ? ? ?  12/30/2019  ? 12:09 PM 10/26/2019  ? 12:00 PM 10/26/2019  ?  5:55 AM 10/19/2019  ?  1:18 PM 09/20/2019  ? 11:00 AM 11/26/2018  ?  9:14 PM 10/29/2018  ?  9:09 AM  ?Advanced Directives  ?Does Patient Have a Medical Advance Directive? No No No No No No No  ?Would patient like information on creating a medical advance directive? No - Patient declined No - Patient declined No - Patient declined   No - Patient declined No - Patient declined  ? ? ?Current Medications (verified) ?Outpatient Encounter Medications as of 05/07/2021  ?Medication Sig  ? baclofen (LIORESAL) 10 MG tablet TAKE 1 TO 2 TABLETS BY MOUTH 3 TIMES DAILY AS NEEDED FOR MUSCLE SPASMS  ? colchicine 0.6 MG tablet Take 1 tablet (0.6 mg total) by mouth 2 (two) times daily as needed.  ? cyanocobalamin (,VITAMIN B-12,) 1000 MCG/ML injection Inject 1 mL (1,000 mcg total) into the muscle every 30 (thirty) days.  ? ELIQUIS 5 MG TABS tablet TAKE 1 TABLET BY MOUTH 2 TIMES DAILY  ? flecainide (TAMBOCOR) 100 MG tablet TAKE 1 TABLET BY MOUTH TWICE (2) DAILY  ? furosemide (LASIX) 20 MG tablet Take 1 tablet (20 mg total) by mouth daily. (Patient taking differently: Take 20 mg by mouth daily as needed.)  ? Multiple Vitamins-Minerals (MULTIVITAMIN WITH MINERALS) tablet Take 1 tablet by mouth daily.  ? pantoprazole (PROTONIX) 40 MG tablet TAKE 1 TABLET BY MOUTH ONCE A DAY  ? Sennosides-Docusate Sodium (SM STOOL SOFTENER/LAXATIVE PO) Take 8.6 mg by mouth daily.  ? traMADol (ULTRAM) 50 MG tablet TAKE 1 TO 2 TABLETS BY MOUTH AT BEDTIME NEEDED  ? ?No facility-administered encounter medications on file as of 05/07/2021.  ? ? ?Allergies (verified) ?Penicillins,  Vancomycin, Penicillins, and Vancomycin  ? ?History: ?Past Medical History:  ?Diagnosis Date  ? Arthritis   ? Arthrofibrosis of total knee arthroplasty ([place]) 05/30/2015  ? Bilateral pulmonary embolism (HCC)   ? a. 03/2015 CTA: acute bilat PE; b. IVC filter placed in 2017--> removed 2018.  ? Chest pain   ? a. 2015 Abnl MV;  b. 2015 Cath: nl cors.  ? Chronic diastolic CHF (congestive heart failure) ([place])   ? a. 03/2015 Echo: EF 55-65%, poor windows, mildly dil RV.  ? DVT (deep venous thrombosis) ([place])   ? a. 03/2015 U/S: occlusive DVT w/in the distal aspect of the Left fem vein through the L popliteal vein.  ? Dyspnea   ? On exertion  ? GERD (gastroesophageal reflux disease)   ? Headache   ? Hypertension   ? Hypertensive heart disease   ? Obesity   ? OSA (obstructive sleep apnea)   ? uses CPAP nightly settings at 15   ? PAF (paroxysmal atrial fibrillation) ([place])   ? Pneumonia   ? hx of years ago   ? Primary localized osteoarthritis of left knee   ? a. 11/2014 s/p L TKA.  ? Primary localized osteoarthritis of right knee   ? a. 02/2014 s/p R Partial Knee arthroplasty.  ? Stiffness of left knee   ?  a. 12/2014 s/p manipulation under anesthesia.  ? Urine incontinence   ? ?Past Surgical History:  ?Procedure Laterality Date  ? APPENDECTOMY    ? CARDIAC CATHETERIZATION    ? CARPAL TUNNEL RELEASE Left 03/2018  ? CYSTOSCOPY W/ RETROGRADES  12/09/2019  ? Procedure: CYSTOSCOPY WITH RETROGRADE PYELOGRAM;  Surgeon: Royston Cowper, MD;  Location: [facility];  Service: Urology;;  ? [place] Right 12/09/2019  ? Procedure: CYSTOSCOPY WITH STENT PLACEMENT;  Surgeon: Royston Cowper, MD;  Location: [facility];  Service: Urology;  Laterality: Right;  ? EXAM UNDER ANESTHESIA WITH MANIPULATION OF KNEE Left 05/30/2015  ? Procedure: EXAM UNDER ANESTHESIA WITH MANIPULATION OF LEFT KNEE;  Surgeon: Marchia Bond, MD;  Location: [place];  Service: Orthopedics;  Laterality: Left;  ? HIATAL HERNIA REPAIR N/A 10/26/2019  ? Procedure:  HIATAL HERNIA REPAIR;  Surgeon: Johnathan Hausen, MD;  Location: [facility];  Service: General;  Laterality: N/A;  ? I & D KNEE WITH POLY EXCHANGE Left 10/30/2015  ? Procedure: IRRIGATION AND DEBRIDEMENT KNEE WITH POLY EXCHANGE PLACE SPACERS;  Surgeon: Frederik Pear, MD;  Location: [place];  Service: Orthopedics;  Laterality: Left;  OFF ELIQUIST 3 DAYS  ? IVC FILTER PLACEMENT ([facility])  04/13/15  ? IVC FILTER REMOVAL N/A 08/13/2016  ? Procedure: IVC Filter Removal;  Surgeon: Katha Cabal, MD;  Location: [place] [facility];  Service: Cardiovascular;  Laterality: N/A;  ? JOINT REPLACEMENT    ? KNEE ARTHROSCOPY Left 08/08/2015  ? Procedure: LEFT KNEE MANIPULATION WITH ARTHROSCOPIC LYSIS OF ADHESIONS AND ASPIRATION;  Surgeon: Marchia Bond, MD;  Location: [place];  Service: Orthopedics;  Laterality: Left;  ? KNEE CLOSED REDUCTION Left 01/20/2015  ? Procedure: LEFT KNEE MANIPULATION;  Surgeon: Marchia Bond, MD;  Location: [place];  Service: Orthopedics;  Laterality: Left;  ? LAPAROSCOPIC GASTRIC SLEEVE RESECTION N/A 10/26/2019  ? Procedure: LAPAROSCOPIC SLEEVE GASTRECTOMY;  Surgeon: Johnathan Hausen, MD;  [facility];  Service: General;  Laterality: N/A;  ? LEFT HEART CATHETERIZATION WITH CORONARY ANGIOGRAM N/A 01/12/2014  ? Procedure: LEFT HEART CATHETERIZATION WITH CORONARY ANGIOGRAM;  Surgeon: Jettie Booze, MD;  Location: [facility];  Service: Cardiovascular;  Laterality: N/A;  ? ORTHOPEDIC SURGERY Left   ? arthroscopy x3  ? PARTIAL KNEE ARTHROPLASTY Right 02/25/2014  ? Procedure: RIGHT KNEE ARTHROPLASTY CONDYLE AND PLATEAU MEDIAL COMPARTMENT ;  Surgeon: Johnny Bridge, MD;  Location: [place];  Service: Orthopedics;  Laterality: Right;  ? PERIPHERAL VASCULAR CATHETERIZATION N/A 03/29/2015  ? Procedure: IVC Filter Insertion, and possible thrombectomy;  Surgeon: Katha Cabal, MD;  Location: [place] [facility];  Service: Cardiovascular;  Laterality: N/A;  ? ROTATOR CUFF REPAIR  Left   ? TOTAL KNEE ARTHROPLASTY  12/06/2014  ? Procedure: TOTAL KNEE ARTHROPLASTY;  Surgeon: Marchia Bond, MD;  Location: [place];  Service: Orthopedics;;  ? TOTAL KNEE REVISION Left 02/08/2016  ? Procedure: TOTAL KNEE REVISION;  Surgeon: Frederik Pear, MD;  Location: [place];  Service: Orthopedics;  Laterality: Left;  ? UPPER GI ENDOSCOPY N/A 10/26/2019  ? Procedure: UPPER GI ENDOSCOPY;  Surgeon: Johnathan Hausen, MD;  [facility];  Service: General;  Laterality: N/A;  ? URETEROSCOPY WITH HOLMIUM LASER LITHOTRIPSY Right 12/30/2019  ? Procedure: URETEROSCOPY WITH HOLMIUM LASER LITHOTRIPSY;  Surgeon: Royston Cowper, MD;  Location: [facility];  Service: Urology;  Laterality: Right;  ? ?Family History  ?Problem Relation Age of Onset  ? Coronary artery disease Mother   ?  Hyperlipidemia Mother   ? Hypertension Mother   ? Arthritis Mother   ? Coronary artery disease Father   ? Heart disease Paternal Grandmother   ? Alzheimer's disease Paternal Grandfather   ? ?Social History  ? ?Socioeconomic History  ? Marital status: Married  ?  Spouse name: Marcelino Campos  ? Number of children: 2  ? Years of education: 58  ? Highest education level: Not on file  ?Occupational History  ? Occupation: tiler  ?  Employer: [employer]  ?Tobacco Use  ? Smoking status: Never  ? Smokeless tobacco: Never  ?Vaping Use  ? Vaping Use: Never used  ?Substance and Sexual Activity  ? Alcohol use: Not Currently  ?  Comment: \  ? Drug use: No  ? Sexual activity: Yes  ?Other Topics Concern  ? Not on file  ?Social History Narrative  ? ** Merged History Encounter **  ?    ? ?Social Determinants of Health  ? ?Financial Resource Strain: Low Risk   ? Difficulty of Paying Living Expenses: Not hard at all  ?Food Insecurity: No Food Insecurity  ? Worried About Charity fundraiser in the Last Year: Never true  ? Ran Out of Food in the Last Year: Never true  ?Transportation Needs: No Transportation Needs  ? Lack of Transportation (Medical): No  ? Lack of Transportation  (Non-Medical): No  ?Physical Activity: Not on file  ?Stress: No Stress Concern Present  ? Feeling of Stress : Not at all  ?Social Connections: Socially Integrated  ? Frequency of Communication with Friends

## 2021-05-08 ENCOUNTER — Encounter: Payer: Self-pay | Admitting: Internal Medicine

## 2021-05-08 ENCOUNTER — Telehealth: Payer: Self-pay

## 2021-05-08 ENCOUNTER — Other Ambulatory Visit: Payer: Self-pay | Admitting: Internal Medicine

## 2021-05-08 DIAGNOSIS — R7989 Other specified abnormal findings of blood chemistry: Secondary | ICD-10-CM

## 2021-05-08 LAB — COMPLETE METABOLIC PANEL WITH GFR
AG Ratio: 1.5 (calc) (ref 1.0–2.5)
ALT: 18 U/L (ref 9–46)
AST: 18 U/L (ref 10–35)
Albumin: 4.1 g/dL (ref 3.6–5.1)
Alkaline phosphatase (APISO): 75 U/L (ref 35–144)
BUN/Creatinine Ratio: 13 (calc) (ref 6–22)
BUN: 19 mg/dL (ref 7–25)
CO2: 29 mmol/L (ref 20–32)
Calcium: 9.6 mg/dL (ref 8.6–10.3)
Chloride: 107 mmol/L (ref 98–110)
Creat: 1.46 mg/dL — ABNORMAL HIGH (ref 0.70–1.35)
Globulin: 2.7 g/dL (calc) (ref 1.9–3.7)
Glucose, Bld: 70 mg/dL (ref 65–139)
Potassium: 4.4 mmol/L (ref 3.5–5.3)
Sodium: 145 mmol/L (ref 135–146)
Total Bilirubin: 0.5 mg/dL (ref 0.2–1.2)
Total Protein: 6.8 g/dL (ref 6.1–8.1)
eGFR: 55 mL/min/{1.73_m2} — ABNORMAL LOW (ref >=60)

## 2021-05-08 LAB — CBC
HCT: 47.1 % (ref 38.5–50.0)
Hemoglobin: 15.9 g/dL (ref 13.2–17.1)
MCH: 30.6 pg (ref 27.0–33.0)
MCHC: 33.8 g/dL (ref 32.0–36.0)
MCV: 90.8 fL (ref 80.0–100.0)
MPV: 10.9 fL (ref 7.5–12.5)
Platelets: 206 10*3/uL (ref 140–400)
RBC: 5.19 10*6/uL (ref 4.20–5.80)
RDW: 12.2 % (ref 11.0–15.0)
WBC: 6.8 10*3/uL (ref 3.8–10.8)

## 2021-05-08 LAB — LIPID PANEL
Cholesterol: 151 mg/dL (ref <200)
HDL: 47 mg/dL (ref >=40)
LDL Cholesterol (Calc): 81 mg/dL (calc)
Non-HDL Cholesterol (Calc): 104 mg/dL (calc) (ref <130)
Total CHOL/HDL Ratio: 3.2 (calc) (ref <5.0)
Triglycerides: 132 mg/dL (ref <150)

## 2021-05-08 LAB — URIC ACID: Uric Acid, Serum: 7.8 mg/dL (ref 4.0–8.0)

## 2021-05-08 LAB — VITAMIN B12: Vitamin B-12: 418 pg/mL (ref 200–1100)

## 2021-05-08 LAB — PSA: PSA: 0.36 ng/mL (ref <=4.00)

## 2021-05-08 NOTE — Telephone Encounter (Signed)
Not back yet. I will get the results to hip as soon as I see the results ?

## 2021-05-08 NOTE — Telephone Encounter (Signed)
Copied from [place] [phone]. Topic: General - Call Back - No Documentation ?>> May 08, 2021  9:49 AM Scherrie Gerlach wrote: ?Reason for CRM: pt would like cb to discuss hip xray from yesterday. ?

## 2021-05-08 NOTE — Telephone Encounter (Signed)
Please review, I'm not sure if the results are back yet. ? ? ?Thanks,  ? ?-Mickel Baas  ?

## 2021-05-09 ENCOUNTER — Encounter: Payer: Self-pay | Admitting: Internal Medicine

## 2021-05-09 DIAGNOSIS — M25552 Pain in left hip: Secondary | ICD-10-CM

## 2021-05-09 NOTE — Addendum Note (Signed)
Addended by: Jearld Fenton on: 05/09/2021 02:10 PM ? ? Modules accepted: Level of Service ? ?

## 2021-05-09 NOTE — Telephone Encounter (Signed)
I reviewed the x-ray with [name].  He agreed to the referral to Orthopedic.  He has going to [facility] in the past.  ? ?Thanks,  ? ?-Mickel Baas  ?

## 2021-05-10 NOTE — Telephone Encounter (Signed)
Referral has been placed. 

## 2021-05-15 DIAGNOSIS — M25562 Pain in left knee: Secondary | ICD-10-CM | POA: Diagnosis not present

## 2021-05-15 DIAGNOSIS — M25552 Pain in left hip: Secondary | ICD-10-CM | POA: Diagnosis not present

## 2021-05-15 NOTE — Telephone Encounter (Signed)
Attempted to schedule.  LMOV to call office.  ° °

## 2021-05-17 ENCOUNTER — Encounter (HOSPITAL_COMMUNITY): Payer: Self-pay | Admitting: *Deleted

## 2021-05-23 DIAGNOSIS — Z9884 Bariatric surgery status: Secondary | ICD-10-CM | POA: Diagnosis not present

## 2021-05-27 DIAGNOSIS — G4733 Obstructive sleep apnea (adult) (pediatric): Secondary | ICD-10-CM | POA: Diagnosis not present

## 2021-06-04 ENCOUNTER — Telehealth: Payer: Self-pay | Admitting: Internal Medicine

## 2021-06-04 NOTE — Telephone Encounter (Signed)
Pt is calling to ask how soon should he return to check his kidney level ?CB- [phone] ?

## 2021-06-05 ENCOUNTER — Other Ambulatory Visit: Payer: Self-pay

## 2021-06-05 DIAGNOSIS — R7989 Other specified abnormal findings of blood chemistry: Secondary | ICD-10-CM

## 2021-06-05 NOTE — Telephone Encounter (Signed)
My recommendation was 1 month.  Lab had already been future ordered.  He can come anytime this week. ?

## 2021-06-05 NOTE — Telephone Encounter (Signed)
Apt 9:15 06/06/2021 Pt advised.  ? ?Thanks,  ? ?-Mickel Baas  ?

## 2021-06-06 ENCOUNTER — Other Ambulatory Visit: Payer: Medicare Other

## 2021-06-06 DIAGNOSIS — R7989 Other specified abnormal findings of blood chemistry: Secondary | ICD-10-CM | POA: Diagnosis not present

## 2021-06-07 ENCOUNTER — Other Ambulatory Visit: Payer: Self-pay | Admitting: Internal Medicine

## 2021-06-07 LAB — BASIC METABOLIC PANEL WITH GFR
BUN: 21 mg/dL (ref 7–25)
CO2: 24 mmol/L (ref 20–32)
Calcium: 9 mg/dL (ref 8.6–10.3)
Chloride: 109 mmol/L (ref 98–110)
Creat: 1.25 mg/dL (ref 0.70–1.35)
Glucose, Bld: 86 mg/dL (ref 65–139)
Potassium: 3.9 mmol/L (ref 3.5–5.3)
Sodium: 142 mmol/L (ref 135–146)
eGFR: 66 mL/min/{1.73_m2} (ref >=60)

## 2021-06-07 MED ORDER — TRAMADOL HCL 50 MG PO TABS: 50.0000 mg | ORAL_TABLET | Freq: Every day | ORAL | 0 refills | Status: DC | PRN

## 2021-06-08 ENCOUNTER — Other Ambulatory Visit (HOSPITAL_COMMUNITY): Payer: Self-pay

## 2021-06-27 DIAGNOSIS — G4733 Obstructive sleep apnea (adult) (pediatric): Secondary | ICD-10-CM | POA: Diagnosis not present

## 2021-07-11 ENCOUNTER — Other Ambulatory Visit: Payer: Self-pay | Admitting: Cardiovascular Disease

## 2021-07-11 DIAGNOSIS — I480 Paroxysmal atrial fibrillation: Secondary | ICD-10-CM

## 2021-07-11 DIAGNOSIS — I2699 Other pulmonary embolism without acute cor pulmonale: Secondary | ICD-10-CM

## 2021-07-11 DIAGNOSIS — I82492 Acute embolism and thrombosis of other specified deep vein of left lower extremity: Secondary | ICD-10-CM

## 2021-07-11 DIAGNOSIS — R0789 Other chest pain: Secondary | ICD-10-CM

## 2021-07-18 ENCOUNTER — Other Ambulatory Visit: Payer: Self-pay | Admitting: Internal Medicine

## 2021-07-18 ENCOUNTER — Ambulatory Visit: Payer: Self-pay | Admitting: *Deleted

## 2021-07-18 NOTE — Telephone Encounter (Signed)
Requested medication (s) are due for refill today: yes  Requested medication (s) are on the active medication list: yes  Last refill:  04/19/2020 #5 with 0 RF  Future visit scheduled: with orthopedic tomorrow  Notes to clinic:  Pt has swelling in one leg, he is going to ortho tomorrow (see triage) and he is asking for a few lasix because if the swelling goes down a little it helps the pain a lot. He will see ortho tomorrow.      Requested Prescriptions  Pending Prescriptions Disp Refills   furosemide (LASIX) 20 MG tablet 5 tablet 0    Sig: Take 1 tablet (20 mg total) by mouth daily.     Cardiovascular:  Diuretics - Loop Failed - 07/18/2021  4:39 PM      Failed - Mg Level in normal range and within 180 days    Magnesium  Date Value Ref Range Status  12/29/2016 1.7 1.7 - 2.4 mg/dL Final         Passed - K in normal range and within 180 days    Potassium  Date Value Ref Range Status  06/06/2021 3.9 3.5 - 5.3 mmol/L Final         Passed - Ca in normal range and within 180 days    Calcium  Date Value Ref Range Status  06/06/2021 9.0 8.6 - 10.3 mg/dL Final   Calcium, Ion  Date Value Ref Range Status  02/25/2017 1.22 1.15 - 1.40 mmol/L Final         Passed - Na in normal range and within 180 days    Sodium  Date Value Ref Range Status  06/06/2021 142 135 - 146 mmol/L Final  02/05/2021 145 (H) 134 - 144 mmol/L Final         Passed - Cr in normal range and within 180 days    Creat  Date Value Ref Range Status  06/06/2021 1.25 0.70 - 1.35 mg/dL Final         Passed - Cl in normal range and within 180 days    Chloride  Date Value Ref Range Status  06/06/2021 109 98 - 110 mmol/L Final         Passed - Last BP in normal range    BP Readings from Last 1 Encounters:  05/07/21 132/72         Passed - Valid encounter within last 6 months    Recent Outpatient Visits           2 months ago Left hip pain   [place], Coralie Keens, NP   9 months  ago Acute idiopathic gout of right ankle   [facility] [place], Coralie Keens, NP   10 months ago Acute drug-induced gout of right ankle   [facility] [place], Coralie Keens, NP   1 year ago [facility] [place], [place], NP   1 year ago Seasonal allergic rhinitis due to pollen   [facility], Coralie Keens, NP       Future Appointments             In 1 week Sharolyn [name], Clance Boll, NP [facility] [place], [facility]            Refused Prescriptions Disp Refills   furosemide (LASIX) 20 MG tablet [Pharmacy Med Name: FUROSEMIDE 20 MG TAB] 5 tablet 0    Sig: TAKE 1 TABLET BY  MOUTH ONCE A DAY     Cardiovascular:  Diuretics - Loop Failed - 07/18/2021  4:39 PM      Failed - Mg Level in normal range and within 180 days    Magnesium  Date Value Ref Range Status  12/29/2016 1.7 1.7 - 2.4 mg/dL Final         Passed - K in normal range and within 180 days    Potassium  Date Value Ref Range Status  06/06/2021 3.9 3.5 - 5.3 mmol/L Final         Passed - Ca in normal range and within 180 days    Calcium  Date Value Ref Range Status  06/06/2021 9.0 8.6 - 10.3 mg/dL Final   Calcium, Ion  Date Value Ref Range Status  02/25/2017 1.22 1.15 - 1.40 mmol/L Final         Passed - Na in normal range and within 180 days    Sodium  Date Value Ref Range Status  06/06/2021 142 135 - 146 mmol/L Final  02/05/2021 145 (H) 134 - 144 mmol/L Final         Passed - Cr in normal range and within 180 days    Creat  Date Value Ref Range Status  06/06/2021 1.25 0.70 - 1.35 mg/dL Final         Passed - Cl in normal range and within 180 days    Chloride  Date Value Ref Range Status  06/06/2021 109 98 - 110 mmol/L Final         Passed - Last BP in normal range    BP Readings from Last 1 Encounters:  05/07/21 132/72         Passed - Valid encounter within last 6 months    Recent Outpatient Visits           2  months ago Left hip pain   [place], Coralie Keens, NP   9 months ago Acute idiopathic gout of right ankle   [place], Coralie Keens, NP   10 months ago Acute drug-induced gout of right ankle   [facility] [place], Coralie Keens, NP   1 year ago [facility] [place], Coralie Keens, NP   1 year ago Seasonal allergic rhinitis due to pollen   [facility], Coralie Keens, NP       Future Appointments             In 1 week Sharolyn [name], Clance Boll, NP [facility], [facility]

## 2021-07-18 NOTE — Telephone Encounter (Signed)
Requested medication (s) are due for refill today - expired Rx  Requested medication (s) are on the active medication list -yes  Future visit scheduled -no  Last refill: 04/19/20 #5  Notes to clinic: expired Rx- sent for provider for review   Requested Prescriptions  Pending Prescriptions Disp Refills   furosemide (LASIX) 20 MG tablet [Pharmacy Med Name: FUROSEMIDE 20 MG TAB] 5 tablet 0    Sig: TAKE 1 TABLET BY MOUTH ONCE A DAY     Cardiovascular:  Diuretics - Loop Failed - 07/18/2021 10:20 AM      Failed - Mg Level in normal range and within 180 days    Magnesium  Date Value Ref Range Status  12/29/2016 1.7 1.7 - 2.4 mg/dL Final         Passed - K in normal range and within 180 days    Potassium  Date Value Ref Range Status  06/06/2021 3.9 3.5 - 5.3 mmol/L Final         Passed - Ca in normal range and within 180 days    Calcium  Date Value Ref Range Status  06/06/2021 9.0 8.6 - 10.3 mg/dL Final   Calcium, Ion  Date Value Ref Range Status  02/25/2017 1.22 1.15 - 1.40 mmol/L Final         Passed - Na in normal range and within 180 days    Sodium  Date Value Ref Range Status  06/06/2021 142 135 - 146 mmol/L Final  02/05/2021 145 (H) 134 - 144 mmol/L Final         Passed - Cr in normal range and within 180 days    Creat  Date Value Ref Range Status  06/06/2021 1.25 0.70 - 1.35 mg/dL Final         Passed - Cl in normal range and within 180 days    Chloride  Date Value Ref Range Status  06/06/2021 109 98 - 110 mmol/L Final         Passed - Last BP in normal range    BP Readings from Last 1 Encounters:  05/07/21 132/72         Passed - Valid encounter within last 6 months    Recent Outpatient Visits           2 months ago Left hip pain   [place], Coralie Keens, NP   9 months ago Acute idiopathic gout of right ankle   [facility] [place], Coralie Keens, NP   10 months ago Acute drug-induced gout of right ankle   [facility] [place], Coralie Keens, NP   1 year ago [facility], Coralie Keens, NP   1 year ago Seasonal allergic rhinitis due to pollen   [facility], Coralie Keens, NP       Future Appointments             In 1 week Sharolyn [name], Clance Boll, NP [facility], [facility]               Requested Prescriptions  Pending Prescriptions Disp Refills   furosemide (LASIX) 20 MG tablet [Pharmacy Med Name: FUROSEMIDE 20 MG TAB] 5 tablet 0    Sig: TAKE 1 TABLET BY MOUTH ONCE A DAY     Cardiovascular:  Diuretics - Loop Failed - 07/18/2021 10:20 AM      Failed - Mg Level in normal range and  within 180 days    Magnesium  Date Value Ref Range Status  12/29/2016 1.7 1.7 - 2.4 mg/dL Final         Passed - K in normal range and within 180 days    Potassium  Date Value Ref Range Status  06/06/2021 3.9 3.5 - 5.3 mmol/L Final         Passed - Ca in normal range and within 180 days    Calcium  Date Value Ref Range Status  06/06/2021 9.0 8.6 - 10.3 mg/dL Final   Calcium, Ion  Date Value Ref Range Status  02/25/2017 1.22 1.15 - 1.40 mmol/L Final         Passed - Na in normal range and within 180 days    Sodium  Date Value Ref Range Status  06/06/2021 142 135 - 146 mmol/L Final  02/05/2021 145 (H) 134 - 144 mmol/L Final         Passed - Cr in normal range and within 180 days    Creat  Date Value Ref Range Status  06/06/2021 1.25 0.70 - 1.35 mg/dL Final         Passed - Cl in normal range and within 180 days    Chloride  Date Value Ref Range Status  06/06/2021 109 98 - 110 mmol/L Final         Passed - Last BP in normal range    BP Readings from Last 1 Encounters:  05/07/21 132/72         Passed - Valid encounter within last 6 months    Recent Outpatient Visits           2 months ago Left hip pain   [place], Coralie Keens, NP   9 months ago Acute idiopathic gout of right ankle   [place], Coralie Keens, NP   10 months ago Acute drug-induced gout of right ankle   [facility], Coralie Keens, NP   1 year ago [facility] [place], [place], NP   1 year ago Seasonal allergic rhinitis due to pollen   [facility], Coralie Keens, NP       Future Appointments             In 1 week Sharolyn [name], Clance Boll, NP [facility], [facility]

## 2021-07-18 NOTE — Addendum Note (Signed)
Addended by: Hoyle Barr on: 07/18/2021 04:39 PM   Modules accepted: Orders

## 2021-07-18 NOTE — Telephone Encounter (Signed)
Summary: knee pain   Patient experiencing fluid build up in  right knee for 3 days. Patient had knee surgery in 2017. Patient unsure why furosemide (LASIX) 20 MG tablet wad denied, he has been taking as needed.      Chief Complaint: swelling in one leg fro knee down. Symptoms: swelling Frequency: constant for one week Pertinent Negatives: Patient denies difficulty breathing Disposition: '[]'$ ED /'[]'$ Urgent Care (no appt availability in office) / '[]'$ Appointment(In office/virtual)/ '[]'$  [facility]/ '[]'$ Home Care/ '[]'$ Refused Recommended Disposition /'[]'$ [place] [facility]/ '[x]'$  Follow-up with PCP Additional Notes: Pt states he is asking for lasix due to he hit his knee with a piece of equipment and it has been swelling since, from knee to foot. He is going to see his orthopedic tomorrow. He is wanting the lasix refilled because it does help the pain if the knee is not quite so swollen. He had two furosemide left earlier in week and they helped but now it is back swollen again.  Reason for Disposition  [1] MODERATE leg swelling (e.g., swelling extends up to knees) AND [2] new-onset or worsening  Answer Assessment - Initial Assessment Questions 1. ONSET: "When did the swelling start?" (e.g., minutes, hours, days)     A week 2. LOCATION: "What part of the leg is swollen?"  "Are both legs swollen or just one leg?"     From knee down 3. SEVERITY: "How bad is the swelling?" (e.g., localized; mild, moderate, severe)  - Localized - small area of swelling localized to one leg  - MILD pedal edema - swelling limited to foot and ankle, pitting edema < 1/4 inch (6 mm) deep, rest and elevation eliminate most or all swelling  - MODERATE edema - swelling of lower leg to knee, pitting edema > 1/4 inch (6 mm) deep, rest and elevation only partially reduce swelling  - SEVERE edema - swelling extends above knee, facial or hand swelling present      Pitting edema 4. REDNESS: "Does the swelling look red or  infected?"     no 5. PAIN: "Is the swelling painful to touch?" If Yes, ask: "How painful is it?"   (Scale 1-10; mild, moderate or severe)     slightly 6. FEVER: "Do you have a fever?" If Yes, ask: "What is it, how was it measured, and when did it start?"      no 7. CAUSE: "What do you think is causing the leg swelling?"     Hit knee on something seeing ortho tomorrow 8. MEDICAL HISTORY: "Do you have a history of heart failure, kidney disease, liver failure, or cancer?"     Blood clots 9. RECURRENT SYMPTOM: "Have you had leg swelling before?" If Yes, ask: "When was the last time?" "What happened that time?"     yes 10. OTHER SYMPTOMS: "Do you have any other symptoms?" (e.g., chest pain, difficulty breathing)       no 11. PREGNANCY: "Is there any chance you are pregnant?" "When was your last menstrual period?"       no  Protocols used: Leg Swelling and Edema-A-AH

## 2021-07-19 DIAGNOSIS — M25562 Pain in left knee: Secondary | ICD-10-CM | POA: Diagnosis not present

## 2021-07-19 MED ORDER — FUROSEMIDE 20 MG PO TABS
20.0000 mg | ORAL_TABLET | Freq: Every day | ORAL | 0 refills | Status: DC | PRN
Start: 2021-07-19 — End: 2022-11-08

## 2021-07-19 NOTE — Addendum Note (Signed)
Addended by: Jearld Fenton on: 07/19/2021 08:59 AM   Modules accepted: Orders

## 2021-07-19 NOTE — Telephone Encounter (Signed)
I have refilled this but he should be taken only as needed not daily due to previous history of impaired kidney function.

## 2021-07-19 NOTE — Telephone Encounter (Signed)
Pt advised.   Thanks,   -Madisun Hargrove  

## 2021-07-27 DIAGNOSIS — G4733 Obstructive sleep apnea (adult) (pediatric): Secondary | ICD-10-CM | POA: Diagnosis not present

## 2021-07-31 ENCOUNTER — Encounter: Payer: Self-pay | Admitting: Nurse Practitioner

## 2021-07-31 ENCOUNTER — Ambulatory Visit: Payer: Medicare Other | Admitting: Nurse Practitioner

## 2021-07-31 VITALS — BP 136/80 | HR 63 | Ht 69.0 in | Wt 234.0 lb

## 2021-07-31 DIAGNOSIS — Z86711 Personal history of pulmonary embolism: Secondary | ICD-10-CM | POA: Diagnosis not present

## 2021-07-31 DIAGNOSIS — I480 Paroxysmal atrial fibrillation: Secondary | ICD-10-CM | POA: Diagnosis not present

## 2021-07-31 DIAGNOSIS — I10 Essential (primary) hypertension: Secondary | ICD-10-CM

## 2021-07-31 DIAGNOSIS — G4733 Obstructive sleep apnea (adult) (pediatric): Secondary | ICD-10-CM

## 2021-07-31 NOTE — Progress Notes (Signed)
Office Visit    Patient Name: Richard [name] Date of Encounter: 07/31/2021  Primary Care Provider:  Jearld Fenton, NP Primary Cardiologist:  Kathlyn Sacramento, MD  Chief Complaint    61 y/o ? w/ a h/o abnormal stress test and normal coronary arteries on cath in 2015, PAF, hypertension, obesity, left lower extremity DVT and bilateral PE on chronic Eliquis, sleep apnea, and arthritis, who presents for follow-up related to PAF.  Past Medical History    Past Medical History:  Diagnosis Date   Arthritis    Arthrofibrosis of total knee arthroplasty ([place]) 05/30/2015   Bilateral pulmonary embolism ([place])    a. 03/2015 CTA: acute bilat PE; b. IVC filter placed in 2017--> removed 2018.   Chest pain    a. 2015 Abnl MV;  b. 2015 Cath: nl cors.   Chronic diastolic CHF (congestive heart failure) ([place])    a. 03/2015 Echo: EF 55-65%, poor windows, mildly dil RV.   DVT (deep venous thrombosis) ([place])    a. 03/2015 U/S: occlusive DVT w/in the distal aspect of the Left fem vein through the L popliteal vein.   Dyspnea    On exertion   GERD (gastroesophageal reflux disease)    Headache    Hypertension    Hypertensive heart disease    Obesity    OSA (obstructive sleep apnea)    uses CPAP nightly settings at 15    PAF (paroxysmal atrial fibrillation) (H[facility])    Pneumonia    hx of years ago    Primary localized osteoarthritis of left knee    a. 11/2014 s/p L TKA.   Primary localized osteoarthritis of right knee    a. 02/2014 s/p R Partial Knee arthroplasty.   Stiffness of left knee    a. 12/2014 s/p manipulation under anesthesia.   Urine incontinence    Past Surgical History:  Procedure Laterality Date   APPENDECTOMY     CARDIAC CATHETERIZATION     CARPAL TUNNEL RELEASE Left 03/2018   CYSTOSCOPY W/ RETROGRADES  12/09/2019   Procedure: CYSTOSCOPY WITH RETROGRADE PYELOGRAM;  Surgeon: Royston Cowper, MD;  Location: [facility];  Service: Urology;;   CYSTOSCOPY WITH STENT PLACEMENT Right  12/09/2019   Procedure: CYSTOSCOPY WITH STENT PLACEMENT;  Surgeon: Royston Cowper, MD;  Location: [facility];  Service: Urology;  Laterality: Right;   EXAM UNDER ANESTHESIA WITH MANIPULATION OF KNEE Left 05/30/2015   Procedure: EXAM UNDER ANESTHESIA WITH MANIPULATION OF LEFT KNEE;  Surgeon: Marchia Bond, MD;  Location: [place];  Service: Orthopedics;  Laterality: Left;   HIATAL HERNIA REPAIR N/A 10/26/2019   Procedure: HIATAL HERNIA REPAIR;  Surgeon: Johnathan Hausen, MD;  Location: [facility];  Service: General;  Laterality: N/A;   I & D KNEE WITH POLY EXCHANGE Left 10/30/2015   Procedure: IRRIGATION AND DEBRIDEMENT KNEE WITH POLY EXCHANGE PLACE SPACERS;  Surgeon: Frederik Pear, MD;  Location: [place];  Service: Orthopedics;  Laterality: Left;  OFF ELIQUIST 3 DAYS   IVC FILTER PLACEMENT ([facility] HX)  04/13/15   IVC FILTER REMOVAL N/A 08/13/2016   Procedure: IVC Filter Removal;  Surgeon: Katha Cabal, MD;  Location: [place] [facility];  Service: Cardiovascular;  Laterality: N/A;   JOINT REPLACEMENT     KNEE ARTHROSCOPY Left 08/08/2015   Procedure: LEFT KNEE MANIPULATION WITH ARTHROSCOPIC LYSIS OF ADHESIONS AND ASPIRATION;  Surgeon: Marchia Bond, MD;  Location: [place];  Service: Orthopedics;  Laterality: Left;   KNEE CLOSED REDUCTION Left 01/20/2015  Procedure: LEFT KNEE MANIPULATION;  Surgeon: Marchia Bond, MD;  Location: [place];  Service: Orthopedics;  Laterality: Left;   LAPAROSCOPIC GASTRIC SLEEVE RESECTION N/A 10/26/2019   Procedure: LAPAROSCOPIC SLEEVE GASTRECTOMY;  Surgeon: Johnathan Hausen, MD;  Location: [facility];  Service: General;  Laterality: N/A;   LEFT HEART CATHETERIZATION WITH CORONARY ANGIOGRAM N/A 01/12/2014   Procedure: LEFT HEART CATHETERIZATION WITH CORONARY ANGIOGRAM;  Surgeon: Jettie Booze, MD;  Location: [facility];  Service: Cardiovascular;  Laterality: N/A;   ORTHOPEDIC SURGERY Left    arthroscopy x3   PARTIAL KNEE ARTHROPLASTY Right 02/25/2014   Procedure:  RIGHT KNEE ARTHROPLASTY CONDYLE AND PLATEAU MEDIAL COMPARTMENT ;  Surgeon: Johnny Bridge, MD;  Location: [place];  Service: Orthopedics;  Laterality: Right;   PERIPHERAL VASCULAR CATHETERIZATION N/A 03/29/2015   Procedure: IVC Filter Insertion, and possible thrombectomy;  Surgeon: Katha Cabal, MD;  Location: [place] [facility];  Service: Cardiovascular;  Laterality: N/A;   ROTATOR CUFF REPAIR Left    TOTAL KNEE ARTHROPLASTY  12/06/2014   Procedure: TOTAL KNEE ARTHROPLASTY;  Surgeon: Marchia Bond, MD;  Location: [place];  Service: Orthopedics;;   TOTAL KNEE REVISION Left 02/08/2016   Procedure: TOTAL KNEE REVISION;  Surgeon: Frederik Pear, MD;  Location: [place];  Service: Orthopedics;  Laterality: Left;   UPPER GI ENDOSCOPY N/A 10/26/2019   Procedure: UPPER GI ENDOSCOPY;  Surgeon: Johnathan Hausen, MD;  [facility];  Service: General;  Laterality: N/A;   URETEROSCOPY WITH HOLMIUM LASER LITHOTRIPSY Right 12/30/2019   Procedure: URETEROSCOPY WITH HOLMIUM LASER LITHOTRIPSY;  Surgeon: Royston Cowper, MD;  Location: [facility];  Service: Urology;  Laterality: Right;    Allergies  Allergies  Allergen Reactions   Penicillins Anaphylaxis and Other (See Comments)    From childhood; uncertain of reaction Has patient had a PCN reaction causing immediate rash, facial/tongue/throat swelling, SOB or lightheadedness with hypotension: Unk Has patient had a PCN reaction causing severe rash involving mucus membranes or skin necrosis: Unk Has patient had a PCN reaction that required hospitalization: Unk Has patient had a PCN reaction occurring within the last 10 years: No If all of the above answers are "NO", then may proceed with Cephalosporin use.   Vancomycin Other (See Comments)    Red Mans' syndrome   Penicillins Other (See Comments)    Childhood   Vancomycin Other (See Comments)    Red man Syndrome    History of Present Illness    61 year old male with the above past  medical history including hypertension, diastolic dysfunction, obesity, sleep apnea, arthritis, DVT following knee surgery (history of multiple knee surgeries) complicated by bilateral PE and atrial fibrillation-now on Eliquis.  He also has a history of abnormal stress testing with normal coronary arteries by catheterization 2015.  From an A-fib standpoint, he has been maintained on flecainide and Eliquis therapy.  He last had recurrent A-fib in December 2018 for which, he was seen in the ER and treated with IV metoprolol with conversion to sinus rhythm.  In October 2021, he underwent gastric sleeve and subsequently lost 80 pounds.  Since then, he has had significant improvement in overall health and activity tolerances.  Richard [name] was last seen in cardiology clinic in January 2023 at which time he was feeling well.  Labs at that time were unremarkable.  He did have follow-up lab work in April which showed a elevation in creatinine to 1.46.  Follow-up in May showed improvement to 1.25.  From a cardiac standpoint, Richard [name] has done well.  He is disappointed that since October, he has gained about 19 pounds and notes that he has been cheating on his diet and also not exercising the way he used to.  He notes that he frequently overindulges throughout the day.  He does plan to get back into the [facility].  Fortunately, he has not had any known recurrence of atrial fibrillation and denies chest pain, palpitations, dyspnea, PND, orthopnea, dizziness, syncope, edema, or early satiety.  Home Medications    Current Outpatient Medications  Medication Sig Dispense Refill   apixaban (ELIQUIS) 5 MG TABS tablet TAKE 1 TABLET BY MOUTH 2 TIMES DAILY 60 tablet 5   baclofen (LIORESAL) 10 MG tablet TAKE 1 TO 2 TABLETS BY MOUTH 3 TIMES DAILY AS NEEDED FOR MUSCLE SPASMS 180 each 0   colchicine 0.6 MG tablet Take 1 tablet (0.6 mg total) by mouth 2 (two) times daily as needed. 30 tablet 0   cyanocobalamin (,VITAMIN B-12,) 1000  MCG/ML injection Inject 1 mL (1,000 mcg total) into the muscle every 30 (thirty) days. 1 mL 3   flecainide (TAMBOCOR) 100 MG tablet TAKE 1 TABLET BY MOUTH TWICE (2) DAILY 180 tablet 0   furosemide (LASIX) 20 MG tablet Take 1 tablet (20 mg total) by mouth daily as needed. 15 tablet 0   Multiple Vitamins-Minerals (MULTIVITAMIN WITH MINERALS) tablet Take 1 tablet by mouth daily.     pantoprazole (PROTONIX) 40 MG tablet TAKE 1 TABLET BY MOUTH ONCE A DAY 90 tablet 1   Sennosides-Docusate Sodium (SM STOOL SOFTENER/LAXATIVE PO) Take 8.6 mg by mouth daily.     traMADol (ULTRAM) 50 MG tablet Take 1 tablet (50 mg total) by mouth daily as needed. 30 tablet 0   No current facility-administered medications for this visit.     Review of Systems    He denies chest pain, palpitations, dyspnea, pnd, orthopnea, n, v, dizziness, syncope, edema, weight gain, or early satiety.  All other systems reviewed and are otherwise negative except as noted above.    Physical Exam    VS:  BP 136/80 (BP Location: Left Arm, Patient Position: Sitting, Cuff Size: Normal)   Pulse 63   Ht '5\' 9"'$  (1.753 m)   Wt 234 lb (106.1 kg)   SpO2 96%   BMI 34.56 kg/m  , BMI Body mass index is 34.56 kg/m.     GEN: Well nourished, well developed, in no acute distress. HEENT: normal. Neck: Supple, no JVD, carotid bruits, or masses. Cardiac: RRR, no murmurs, rubs, or gallops. No clubbing, cyanosis, edema.  Radials/PT 2+ and equal bilaterally.  Respiratory:  Respirations regular and unlabored, clear to auscultation bilaterally. GI: Soft, nontender, nondistended, BS + x 4. MS: no deformity or atrophy. Skin: warm and dry, no rash. Neuro:  Strength and sensation are intact. Psych: Normal affect.  Accessory Clinical Findings    ECG personally reviewed by me today -regular sinus rhythm, 63, first-degree AV block, PACs, left axis deviation with left anterior fascicular block- no acute changes.  Lab Results  Component Value Date   WBC  6.8 05/07/2021   HGB 15.9 05/07/2021   HCT 47.1 05/07/2021   MCV 90.8 05/07/2021   PLT 206 05/07/2021   Lab Results  Component Value Date   CREATININE 1.25 06/06/2021   BUN 21 06/06/2021   NA 142 06/06/2021   K 3.9 06/06/2021   CL 109 06/06/2021   CO2 24 06/06/2021   Lab Results  Component  Value Date   ALT 18 05/07/2021   AST 18 05/07/2021   ALKPHOS 85 02/05/2021   BILITOT 0.5 05/07/2021   Lab Results  Component Value Date   CHOL 151 05/07/2021   HDL 47 05/07/2021   LDLCALC 81 05/07/2021   TRIG 132 05/07/2021   CHOLHDL 3.2 05/07/2021    Lab Results  Component Value Date   HGBA1C 4.9 01/17/2020    Assessment & Plan    1.  Paroxysmal atrial fibrillation: Quiescent on flecainide therapy.  He was previously on metoprolol XL, which was discontinued secondary to bradycardia.  He is anticoagulated with Eliquis in the setting of a CHA2DS2-VASc of 1.  Stable blood counts in April and stable basic metabolic panel in May.  2.  Essential hypertension: Blood pressure mildly elevated today at 136/80.  He is not on any antihypertensives.  We discussed the importance of lifestyle modifications and weight loss which will certainly impact blood pressure.  3.  History of DVT/PE: On chronic Eliquis.  Stable labs in April and May.  4.  Obstructive sleep apnea: Compliant with CPAP at night.  5.  Morbid obesity: He has significant weight loss following gastric sleeve in October 2021 but is up 19 pounds since October 2022.  As above, he has not been exercising and admits to dietary indiscretions.  Encouraged closer monitoring of his diet with caloric reduction in at least 30 minutes of exercise daily.  He does have a membership to [employer] and plans to start going again.  6.  Disposition: Follow-up in 6 months or sooner if necessary.  Murray Hodgkins, NP 07/31/2021, 4:55 PM

## 2021-07-31 NOTE — Patient Instructions (Signed)
Medication Instructions:  No changes at this time.   *If you need a refill on your cardiac medications before your next appointment, please call your pharmacy*   Lab Work: None  If you have labs (blood work) drawn today and your tests are completely normal, you will receive your results only by: [place] (if you have MyChart) OR A paper copy in the mail If you have any lab test that is abnormal or we need to change your treatment, we will call you to review the results.   Testing/Procedures: None   Follow-Up: At [facility], you and your health needs are our priority.  As part of our continuing mission to provide you with exceptional heart care, we have created designated Provider Care Teams.  These Care Teams include your primary Cardiologist (physician) and Advanced Practice Providers (APPs -  Physician Assistants and Nurse Practitioners) who all work together to provide you with the care you need, when you need it.   Your next appointment:   6 month(s)  The format for your next appointment:   In Person  Provider:   Kathlyn Sacramento, MD or Murray Hodgkins, NP    Important Information About Sugar

## 2021-09-07 ENCOUNTER — Other Ambulatory Visit: Payer: Self-pay | Admitting: Internal Medicine

## 2021-09-07 NOTE — Telephone Encounter (Signed)
Requested Prescriptions  Pending Prescriptions Disp Refills  . pantoprazole (PROTONIX) 40 MG tablet [Pharmacy Med Name: PANTOPRAZOLE SODIUM 40 MG DR TAB] 90 tablet 1    Sig: TAKE 1 TABLET BY MOUTH ONCE A DAY     Gastroenterology: Proton Pump Inhibitors Passed - 09/07/2021  1:25 PM      Passed - Valid encounter within last 12 months    Recent Outpatient Visits          4 months ago Left hip pain   [facility] [place], [place], [place]   11 months ago Acute idiopathic gout of right ankle   [facility] [place], Coralie Keens, NP   1 year ago Acute drug-induced gout of right ankle   [facility] [place], Coralie Keens, NP   1 year ago [facility] [place], [place], NP   1 year ago Seasonal allergic rhinitis due to pollen   [facility], Coralie Keens, NP

## 2021-09-27 DIAGNOSIS — G4733 Obstructive sleep apnea (adult) (pediatric): Secondary | ICD-10-CM | POA: Diagnosis not present

## 2021-10-15 ENCOUNTER — Other Ambulatory Visit: Payer: Self-pay | Admitting: Internal Medicine

## 2021-10-16 NOTE — Telephone Encounter (Signed)
Requested medication (s) are due for refill today: yes  Requested medication (s) are on the active medication list: yes  Last refill:  06/07/21 #30  Future visit scheduled: no  Notes to clinic:  Called pt and LM on VM to call back to make appt/Prescription not delegated to NT to refill   Requested Prescriptions  Pending Prescriptions Disp Refills   traMADol (ULTRAM) 50 MG tablet [Pharmacy Med Name: TRAMADOL HCL 50 MG TAB] 30 tablet     Sig: TAKE 1 TABLET BY MOUTH DAILY AS NEEDED     Not Delegated - Analgesics:  Opioid Agonists Failed - 10/15/2021  1:50 PM      Failed - This refill cannot be delegated      Failed - Urine Drug Screen completed in last 360 days      Failed - Valid encounter within last 3 months    Recent Outpatient Visits           5 months ago Left hip pain   [facility], Coralie Keens, NP   1 year ago Acute idiopathic gout of right ankle   [facility] [place], [place], NP   1 year ago Acute drug-induced gout of right ankle   [facility] [place], Coralie Keens, NP   1 year ago [facility] [place], [place], NP   1 year ago Seasonal allergic rhinitis due to pollen   [facility] [place], Coralie Keens, NP

## 2021-10-22 ENCOUNTER — Other Ambulatory Visit: Payer: Self-pay | Admitting: Cardiovascular Disease

## 2021-10-22 DIAGNOSIS — I480 Paroxysmal atrial fibrillation: Secondary | ICD-10-CM

## 2021-10-22 DIAGNOSIS — I82492 Acute embolism and thrombosis of other specified deep vein of left lower extremity: Secondary | ICD-10-CM

## 2021-10-22 DIAGNOSIS — I2699 Other pulmonary embolism without acute cor pulmonale: Secondary | ICD-10-CM

## 2021-10-22 DIAGNOSIS — R0789 Other chest pain: Secondary | ICD-10-CM

## 2021-10-24 DIAGNOSIS — G4733 Obstructive sleep apnea (adult) (pediatric): Secondary | ICD-10-CM | POA: Diagnosis not present

## 2021-10-27 DIAGNOSIS — G4733 Obstructive sleep apnea (adult) (pediatric): Secondary | ICD-10-CM | POA: Diagnosis not present

## 2021-11-05 ENCOUNTER — Ambulatory Visit: Payer: Self-pay | Admitting: *Deleted

## 2021-11-05 NOTE — Telephone Encounter (Signed)
  Chief Complaint: headache Symptoms: pain in head with pressure- cough, sneeze, bending Frequency: 3-4 days Pertinent Negatives: Patient denies fever, stiff neck, eye pain, sore throat, cold symptoms Disposition: '[]'$ ED /'[]'$ Urgent Care (no appt availability in office) / '[x]'$ Appointment(In office/virtual)/ '[]'$  [facility]/ '[]'$ Home Care/ '[]'$ Refused Recommended Disposition /'[]'$ [place] [facility]/ '[]'$  Follow-up with PCP Additional Notes: Patient advised check BP to make sure his BP is not high- patient will call back with changes in symptoms- high BP readings

## 2021-11-05 NOTE — Telephone Encounter (Signed)
Reason for Disposition  [1] MODERATE headache (e.g., interferes with normal activities) AND [2] present > 24 hours AND [3] unexplained  (Exceptions: analgesics not tried, typical migraine, or headache part of viral illness)  Answer Assessment - Initial Assessment Questions 1. LOCATION: "Where does it hurt?"      Left side of head 2. ONSET: "When did the headache start?" (Minutes, hours or days)      3-4 days 3. PATTERN: "Does the pain come and go, or has it been constant since it started?"     Comes and goes 4. SEVERITY: "How bad is the pain?" and "What does it keep you from doing?"  (e.g., Scale 1-10; mild, moderate, or severe)   - MILD (1-3): doesn't interfere with normal activities    - MODERATE (4-7): interferes with normal activities or awakens from sleep    - SEVERE (8-10): excruciating pain, unable to do any normal activities        Severe with cough 5. RECURRENT SYMPTOM: "Have you ever had headaches before?" If Yes, ask: "When was the last time?" and "What happened that time?"      -hx migraine 6. CAUSE: "What do you think is causing the headache?"     Unsure- ? BP 7. MIGRAINE: "Have you been diagnosed with migraine headaches?" If Yes, ask: "Is this headache similar?"      Yes- years ago 8. HEAD INJURY: "Has there been any recent injury to the head?"      no 9. OTHER SYMPTOMS: "Do you have any other symptoms?" (fever, stiff neck, eye pain, sore throat, cold symptoms)     no 10. PREGNANCY: "Is there any chance you are pregnant?" "When was your last menstrual period?"  Protocols used: [facility]

## 2021-11-06 ENCOUNTER — Ambulatory Visit: Payer: Medicare Other | Admitting: Internal Medicine

## 2021-11-06 NOTE — Progress Notes (Deleted)
Subjective:    Patient ID: Richard [name], male    DOB: 1960-04-23, 61 y.o.   MRN: [id number]  HPI  Patient presents to clinic today for follow-up of chronic conditions.  OA: Generalized.  He takes Baclofen and Tramadol as needed with good relief of symptoms.  History of DVT/PE: On lifelong Eliquis.  He does not follow with hematology.  CHF, Diastolic: He denies lower extremity edema, chronic cough or shortness of breath.  He is taking Flecainide, furosemide as prescribed.  Echo from 03/2015 reviewed.  He follows with cardiology.  GERD: Intermittent.  He takes Pantoprazole as needed with good relief of symptoms.  There is no upper GI on file.  HTN: His BP today is.  He is taking Flecainide, Furosemide as prescribed.  ECG from 07/2021 reviewed.  Gout: He denies recent flare.  He takes Colchicine as needed with good relief of symptoms.  He does not follow with rheumatology.  OSA: He averages 7 hours of sleep per night with use of his CPAP.  There is no sleep study on file.  Paroxysmal A-fib: Managed with  Eliquis.  ECG from 07/2021 reviewed.  He follows with cardiology.  HLD with aortic atherosclerosis: His last LDL was 83, triglycerides 57, 01/2021.  He is not taking any cholesterol-lowering medication at this time.  He is taking Eliquis as prescribed.  He tries to consume a low-fat diet.   Prediabetes: His last A1c was 4.9%, 01/2021.  He is not taking any oral diabetic medication this time.  He does not check his sugars.  He also reports headache.  This started 3 to 4 days ago.  The headache is located.  He describes the pain as.  He reports associated.  He denies.  Review of Systems     Past Medical History:  Diagnosis Date   Arthritis    Arthrofibrosis of total knee arthroplasty ([place]) 05/30/2015   Bilateral pulmonary embolism ([place])    a. 03/2015 CTA: acute bilat PE; b. IVC filter placed in 2017--> removed 2018.   Chest pain    a. 2015 Abnl MV;  b. 2015 Cath: nl cors.    Chronic diastolic CHF (congestive heart failure) ([place])    a. 03/2015 Echo: EF 55-65%, poor windows, mildly dil RV.   DVT (deep venous thrombosis) ([place])    a. 03/2015 U/S: occlusive DVT w/in the distal aspect of the Left fem vein through the L popliteal vein.   Dyspnea    On exertion   GERD (gastroesophageal reflux disease)    Headache    Hypertension    Hypertensive heart disease    Obesity    OSA (obstructive sleep apnea)    uses CPAP nightly settings at 15    PAF (paroxysmal atrial fibrillation) ([facility])    Pneumonia    hx of years ago    Primary localized osteoarthritis of left knee    a. 11/2014 s/p L TKA.   Primary localized osteoarthritis of right knee    a. 02/2014 s/p R Partial Knee arthroplasty.   Stiffness of left knee    a. 12/2014 s/p manipulation under anesthesia.   Urine incontinence     Current Outpatient Medications  Medication Sig Dispense Refill   apixaban (ELIQUIS) 5 MG TABS tablet TAKE 1 TABLET BY MOUTH 2 TIMES DAILY 60 tablet 5   baclofen (LIORESAL) 10 MG tablet TAKE 1 TO 2 TABLETS BY MOUTH 3 TIMES DAILY AS NEEDED FOR MUSCLE SPASMS 180 each 0   colchicine 0.6 MG  tablet Take 1 tablet (0.6 mg total) by mouth 2 (two) times daily as needed. 30 tablet 0   cyanocobalamin (,VITAMIN B-12,) 1000 MCG/ML injection Inject 1 mL (1,000 mcg total) into the muscle every 30 (thirty) days. 1 mL 3   flecainide (TAMBOCOR) 100 MG tablet TAKE 1 TABLET BY MOUTH TWICE (2) DAILY 180 tablet 0   furosemide (LASIX) 20 MG tablet Take 1 tablet (20 mg total) by mouth daily as needed. 15 tablet 0   Multiple Vitamins-Minerals (MULTIVITAMIN WITH MINERALS) tablet Take 1 tablet by mouth daily.     pantoprazole (PROTONIX) 40 MG tablet TAKE 1 TABLET BY MOUTH ONCE A DAY 90 tablet 1   Sennosides-Docusate Sodium (SM STOOL SOFTENER/LAXATIVE PO) Take 8.6 mg by mouth daily.     traMADol (ULTRAM) 50 MG tablet TAKE 1 TABLET BY MOUTH DAILY AS NEEDED 30 tablet 0   No current facility-administered medications  for this visit.    Allergies  Allergen Reactions   Penicillins Anaphylaxis and Other (See Comments)    From childhood; uncertain of reaction Has patient had a PCN reaction causing immediate rash, facial/tongue/throat swelling, SOB or lightheadedness with hypotension: Unk Has patient had a PCN reaction causing severe rash involving mucus membranes or skin necrosis: Unk Has patient had a PCN reaction that required hospitalization: Unk Has patient had a PCN reaction occurring within the last 10 years: No If all of the above answers are "NO", then may proceed with Cephalosporin use.   Vancomycin Other (See Comments)    Red Mans' syndrome   Penicillins Other (See Comments)    Childhood   Vancomycin Other (See Comments)    Red man Syndrome    Family History  Problem Relation Age of Onset   Coronary artery disease Mother    Hyperlipidemia Mother    Hypertension Mother    Arthritis Mother    Coronary artery disease Father    Heart disease Paternal Grandmother    Alzheimer's disease Paternal Grandfather     Social History   Socioeconomic History   Marital status: Married    Spouse name: Naphtali Zywicki   Number of children: 2   Years of education: 12   Highest education level: Not on file  Occupational History   Occupation: Energy manager: [employer]  Tobacco Use   Smoking status: Never   Smokeless tobacco: Never  Vaping Use   Vaping Use: Never used  Substance and Sexual Activity   Alcohol use: Not Currently    Comment: \   Drug use: No   Sexual activity: Yes  Other Topics Concern   Not on file  Social History Narrative   ** Merged History Encounter **       Social Determinants of Health   Financial Resource Strain: Low Risk  (05/07/2021)   Overall Financial Resource Strain (CARDIA)    Difficulty of Paying Living Expenses: Not hard at all  Food Insecurity: No Food Insecurity (05/07/2021)   Hunger Vital Sign    Worried About Running Out of Food in the Last Year:  Never true    [place] in the Last Year: Never true  Transportation Needs: No Transportation Needs (05/07/2021)   PRAPARE - Hydrologist (Medical): No    Lack of Transportation (Non-Medical): No  Physical Activity: Not on file  Stress: No Stress Concern Present (05/07/2021)   [place]    Feeling of Stress :  Not at all  Social Connections: Socially Integrated (05/07/2021)   Social Connection and Isolation Panel [NHANES]    Frequency of Communication with Friends and Family: More than three times a week    Frequency of Social Gatherings with Friends and Family: More than three times a week    Attends Religious Services: More than 4 times per year    Active Member of [employer] or Organizations: Yes    Attends Music therapist: More than 4 times per year    Marital Status: Married  Human resources officer Violence: Not on file     Constitutional: Patient ports headache.  Denies fever, malaise, fatigue, or abrupt weight changes.  HEENT: Denies eye pain, eye redness, ear pain, ringing in the ears, wax buildup, runny nose, nasal congestion, bloody nose, or sore throat. Respiratory: Denies difficulty breathing, shortness of breath, cough or sputum production.   Cardiovascular: Denies chest pain, chest tightness, palpitations or swelling in the hands or feet.  Gastrointestinal: Denies abdominal pain, bloating, constipation, diarrhea or blood in the stool.  GU: Denies urgency, frequency, pain with urination, burning sensation, blood in urine, odor or discharge. Musculoskeletal: Patient reports joint pain.  Denies decrease in range of motion, difficulty with gait, muscle pain or joint swelling.  Skin: Denies redness, rashes, lesions or ulcercations.  Neurological: Denies dizziness, difficulty with memory, difficulty with speech or problems with balance and coordination.  Psych: Denies anxiety,  depression, SI/HI.  No other specific complaints in a complete review of systems (except as listed in HPI above).  Objective:   Physical Exam   There were no vitals taken for this visit. Wt Readings from Last 3 Encounters:  07/31/21 234 lb (106.1 kg)  05/07/21 230 lb (104.3 kg)  04/16/21 231 lb (104.8 kg)    General: Appears their stated age, well developed, well nourished in NAD. Skin: Warm, dry and intact. No rashes, lesions or ulcerations noted. HEENT: Head: normal shape and size; Eyes: sclera white, no icterus, conjunctiva pink, PERRLA and EOMs intact; Ears: Tm's gray and intact, normal light reflex; Nose: mucosa pink and moist, septum midline; Throat/Mouth: Teeth present, mucosa pink and moist, no exudate, lesions or ulcerations noted.  Neck:  Neck supple, trachea midline. No masses, lumps or thyromegaly present.  Cardiovascular: Normal rate and rhythm. S1,S2 noted.  No murmur, rubs or gallops noted. No JVD or BLE edema. No carotid bruits noted. Pulmonary/Chest: Normal effort and positive vesicular breath sounds. No respiratory distress. No wheezes, rales or ronchi noted.  Abdomen: Soft and nontender. Normal bowel sounds. No distention or masses noted. Liver, spleen and kidneys non palpable. Musculoskeletal: Normal range of motion. No signs of joint swelling. No difficulty with gait.  Neurological: Alert and oriented. Cranial nerves II-XII grossly intact. Coordination normal.  Psychiatric: Mood and affect normal. Behavior is normal. Judgment and thought content normal.    BMET    Component Value Date/Time   NA 142 06/06/2021 0858   NA 145 (H) 02/05/2021 0909   K 3.9 06/06/2021 0858   CL 109 06/06/2021 0858   CO2 24 06/06/2021 0858   GLUCOSE 86 06/06/2021 0858   BUN 21 06/06/2021 0858   BUN 16 02/05/2021 0909   CREATININE 1.25 06/06/2021 0858   CALCIUM 9.0 06/06/2021 0858   GFRNONAA >60 12/26/2019 0123   GFRAA >60 10/19/2019 1337   GFRAA >60 10/19/2019 1337    Lipid  Panel     Component Value Date/Time   CHOL 151 05/07/2021 1349   CHOL 143 02/05/2021  4098   TRIG 132 05/07/2021 1349   HDL 47 05/07/2021 1349   HDL 48 02/05/2021 0909   CHOLHDL 3.2 05/07/2021 1349   VLDL 22.4 01/17/2020 1214   LDLCALC 81 05/07/2021 1349    CBC    Component Value Date/Time   WBC 6.8 05/07/2021 1349   RBC 5.19 05/07/2021 1349   HGB 15.9 05/07/2021 1349   HGB 16.2 02/05/2021 0909   HCT 47.1 05/07/2021 1349   HCT 45.5 02/05/2021 0909   PLT 206 05/07/2021 1349   PLT 195 02/05/2021 0909   MCV 90.8 05/07/2021 1349   MCV 88 02/05/2021 0909   MCH 30.6 05/07/2021 1349   MCHC 33.8 05/07/2021 1349   RDW 12.2 05/07/2021 1349   RDW 12.6 02/05/2021 0909   LYMPHSABS 1.2 10/27/2019 1133   LYMPHSABS 1.6 10/31/2017 1438   MONOABS 1.0 10/27/2019 1133   EOSABS 0.0 10/27/2019 1133   EOSABS 0.2 10/31/2017 1438   BASOSABS 0.0 10/27/2019 1133   BASOSABS 0.0 10/31/2017 1438    Hgb A1C Lab Results  Component Value Date   HGBA1C 4.9 01/17/2020           Assessment & Plan:     RTC in 6 months, for your annual exam Webb Silversmith, NP

## 2021-11-09 ENCOUNTER — Encounter: Payer: Self-pay | Admitting: Internal Medicine

## 2021-11-09 ENCOUNTER — Ambulatory Visit (INDEPENDENT_AMBULATORY_CARE_PROVIDER_SITE_OTHER): Payer: Medicare Other | Admitting: Internal Medicine

## 2021-11-09 VITALS — BP 136/86 | HR 56 | Temp 96.9°F | Wt 233.0 lb

## 2021-11-09 DIAGNOSIS — Z23 Encounter for immunization: Secondary | ICD-10-CM | POA: Diagnosis not present

## 2021-11-09 DIAGNOSIS — M1A0790 Idiopathic chronic gout, unspecified ankle and foot, without tophus (tophi): Secondary | ICD-10-CM | POA: Diagnosis not present

## 2021-11-09 DIAGNOSIS — Z86711 Personal history of pulmonary embolism: Secondary | ICD-10-CM

## 2021-11-09 DIAGNOSIS — M159 Polyosteoarthritis, unspecified: Secondary | ICD-10-CM | POA: Diagnosis not present

## 2021-11-09 DIAGNOSIS — G4733 Obstructive sleep apnea (adult) (pediatric): Secondary | ICD-10-CM

## 2021-11-09 DIAGNOSIS — I5032 Chronic diastolic (congestive) heart failure: Secondary | ICD-10-CM | POA: Diagnosis not present

## 2021-11-09 DIAGNOSIS — I480 Paroxysmal atrial fibrillation: Secondary | ICD-10-CM

## 2021-11-09 DIAGNOSIS — K219 Gastro-esophageal reflux disease without esophagitis: Secondary | ICD-10-CM

## 2021-11-09 DIAGNOSIS — M150 Primary generalized (osteo)arthritis: Secondary | ICD-10-CM

## 2021-11-09 DIAGNOSIS — I10 Essential (primary) hypertension: Secondary | ICD-10-CM | POA: Diagnosis not present

## 2021-11-09 DIAGNOSIS — Z86718 Personal history of other venous thrombosis and embolism: Secondary | ICD-10-CM

## 2021-11-09 DIAGNOSIS — R519 Headache, unspecified: Secondary | ICD-10-CM | POA: Diagnosis not present

## 2021-11-09 DIAGNOSIS — Z6834 Body mass index (BMI) 34.0-34.9, adult: Secondary | ICD-10-CM

## 2021-11-09 DIAGNOSIS — M109 Gout, unspecified: Secondary | ICD-10-CM | POA: Insufficient documentation

## 2021-11-09 DIAGNOSIS — E6609 Other obesity due to excess calories: Secondary | ICD-10-CM

## 2021-11-09 LAB — BASIC METABOLIC PANEL WITH GFR
BUN: 22 mg/dL (ref 7–25)
CO2: 29 mmol/L (ref 20–32)
Calcium: 9.3 mg/dL (ref 8.6–10.3)
Chloride: 105 mmol/L (ref 98–110)
Creat: 1.27 mg/dL (ref 0.70–1.35)
Glucose, Bld: 80 mg/dL (ref 65–99)
Potassium: 4.1 mmol/L (ref 3.5–5.3)
Sodium: 141 mmol/L (ref 135–146)
eGFR: 65 mL/min/{1.73_m2} (ref >=60)

## 2021-11-09 MED ORDER — COLCHICINE 0.6 MG PO TABS: 0.6000 mg | ORAL_TABLET | Freq: Two times a day (BID) | ORAL | 0 refills | Status: DC | PRN

## 2021-11-09 MED ORDER — LISINOPRIL 10 MG PO TABS: 10.0000 mg | ORAL_TABLET | Freq: Every day | ORAL | 1 refills | Status: DC

## 2021-11-09 NOTE — Assessment & Plan Note (Signed)
Uncontrolled off meds Rx for lisinopril 10 mg daily Reinforced DASH diet and exercise for weight loss Bmet today

## 2021-11-09 NOTE — Assessment & Plan Note (Signed)
Avoid foods that trigger reflux Encourage weight loss as this can help reduce reflux symptoms Continue pantoprazole

## 2021-11-09 NOTE — Assessment & Plan Note (Signed)
Continue Eliquis and flecainide

## 2021-11-09 NOTE — Assessment & Plan Note (Signed)
Encourage low purine diet Continue colchicine as needed

## 2021-11-09 NOTE — Assessment & Plan Note (Signed)
Lifelong Eliquis

## 2021-11-09 NOTE — Assessment & Plan Note (Signed)
Encouraged low-fat diet

## 2021-11-09 NOTE — Progress Notes (Signed)
Subjective:    Patient ID: Richard [name], male    DOB: 06/18/1960, 61 y.o.   MRN: [id number]  HPI  Patient presents to clinic today for follow-up of chronic conditions.  OA: Generalized.  He takes Baclofen and Tramadol as needed with good relief of symptoms.  He follows with orthopedics.  History of DVT/PE: On lifelong Eliquis.  He no longer follows with hematology.  CHF, Diastolic: He denies chronic cough, shortness of breath or lower extremity edema.  He is taking Flecainide, Furosemide as needed.  Echo from 03/2015 reviewed.  He follows with cardiology.  GERD: Triggered by tomato based foods.  He denies breakthrough on Pantoprazole.  There is no upper GI on file.  HTN: His BP today is 136/86.  He is taking Flecainide, Furosemide.  ECG from 07/2021 reviewed.  OSA: He averages 7 hours of sleep per night with use of his CPAP.  There is no sleep study on file.    A-fib: Paroxysmal.  Managed on Eliquis.  ECG from 07/2021 reviewed.  Gout: He reports recent gout flare.  He has Colchicine to take if needed.  He does not follow with rheumatology.  He also reports headache.  This started 1 week ago.  The headache is located in his temples and top of his head.  He describes the pain as sharp and shooting.  He reports associated intermittent dizziness but denies vision changes, sensitivity to light, sounds, nausea, vomiting.  He denies neck pain. He take Tramadol as needed with some relief of symptoms.   Review of Systems  Past Medical History:  Diagnosis Date   Arthritis    Arthrofibrosis of total knee arthroplasty ([place]) 05/30/2015   Bilateral pulmonary embolism ([place])    a. 03/2015 CTA: acute bilat PE; b. IVC filter placed in 2017--> removed 2018.   Chest pain    a. 2015 Abnl MV;  b. 2015 Cath: nl cors.   Chronic diastolic CHF (congestive heart failure) ([place])    a. 03/2015 Echo: EF 55-65%, poor windows, mildly dil RV.   DVT (deep venous thrombosis) ([place])    a. 03/2015 U/S: occlusive DVT  w/in the distal aspect of the Left fem vein through the L popliteal vein.   Dyspnea    On exertion   GERD (gastroesophageal reflux disease)    Headache    Hypertension    Hypertensive heart disease    Obesity    OSA (obstructive sleep apnea)    uses CPAP nightly settings at 15    PAF (paroxysmal atrial fibrillation) ([facility])    Pneumonia    hx of years ago    Primary localized osteoarthritis of left knee    a. 11/2014 s/p L TKA.   Primary localized osteoarthritis of right knee    a. 02/2014 s/p R Partial Knee arthroplasty.   Stiffness of left knee    a. 12/2014 s/p manipulation under anesthesia.   Urine incontinence     Current Outpatient Medications  Medication Sig Dispense Refill   apixaban (ELIQUIS) 5 MG TABS tablet TAKE 1 TABLET BY MOUTH 2 TIMES DAILY 60 tablet 5   baclofen (LIORESAL) 10 MG tablet TAKE 1 TO 2 TABLETS BY MOUTH 3 TIMES DAILY AS NEEDED FOR MUSCLE SPASMS 180 each 0   colchicine 0.6 MG tablet Take 1 tablet (0.6 mg total) by mouth 2 (two) times daily as needed. 30 tablet 0   cyanocobalamin (,VITAMIN B-12,) 1000 MCG/ML injection Inject 1 mL (1,000 mcg total) into the muscle every 30 (  thirty) days. 1 mL 3   flecainide (TAMBOCOR) 100 MG tablet TAKE 1 TABLET BY MOUTH TWICE (2) DAILY 180 tablet 0   furosemide (LASIX) 20 MG tablet Take 1 tablet (20 mg total) by mouth daily as needed. 15 tablet 0   Multiple Vitamins-Minerals (MULTIVITAMIN WITH MINERALS) tablet Take 1 tablet by mouth daily.     pantoprazole (PROTONIX) 40 MG tablet TAKE 1 TABLET BY MOUTH ONCE A DAY 90 tablet 1   Sennosides-Docusate Sodium (SM STOOL SOFTENER/LAXATIVE PO) Take 8.6 mg by mouth daily.     traMADol (ULTRAM) 50 MG tablet TAKE 1 TABLET BY MOUTH DAILY AS NEEDED 30 tablet 0   No current facility-administered medications for this visit.    Allergies  Allergen Reactions   Penicillins Anaphylaxis and Other (See Comments)    From childhood; uncertain of reaction Has patient had a PCN reaction causing  immediate rash, facial/tongue/throat swelling, SOB or lightheadedness with hypotension: Unk Has patient had a PCN reaction causing severe rash involving mucus membranes or skin necrosis: Unk Has patient had a PCN reaction that required hospitalization: Unk Has patient had a PCN reaction occurring within the last 10 years: No If all of the above answers are "NO", then may proceed with Cephalosporin use.   Vancomycin Other (See Comments)    Red Mans' syndrome   Penicillins Other (See Comments)    Childhood   Vancomycin Other (See Comments)    Red man Syndrome    Family History  Problem Relation Age of Onset   Coronary artery disease Mother    Hyperlipidemia Mother    Hypertension Mother    Arthritis Mother    Coronary artery disease Father    Heart disease Paternal Grandmother    Alzheimer's disease Paternal Grandfather     Social History   Socioeconomic History   Marital status: Married    Spouse name: Dick Hark   Number of children: 2   Years of education: 12   Highest education level: Not on file  Occupational History   Occupation: Energy manager: [employer]  Tobacco Use   Smoking status: Never   Smokeless tobacco: Never  Vaping Use   Vaping Use: Never used  Substance and Sexual Activity   Alcohol use: Not Currently    Comment: \   Drug use: No   Sexual activity: Yes  Other Topics Concern   Not on file  Social History Narrative   ** Merged History Encounter **       Social Determinants of Health   Financial Resource Strain: Low Risk  (05/07/2021)   Overall Financial Resource Strain (CARDIA)    Difficulty of Paying Living Expenses: Not hard at all  Food Insecurity: No Food Insecurity (05/07/2021)   Hunger Vital Sign    Worried About Running Out of Food in the Last Year: Never true    [place] in the Last Year: Never true  Transportation Needs: No Transportation Needs (05/07/2021)   PRAPARE - Hydrologist (Medical):  No    Lack of Transportation (Non-Medical): No  Physical Activity: Not on file  Stress: No Stress Concern Present (05/07/2021)   [place]    Feeling of Stress : Not at all  Social Connections: [place] (05/07/2021)   Social Connection and Isolation Panel [NHANES]    Frequency of Communication with Friends and Family: More than three times a week    Frequency  of Social Gatherings with Friends and Family: More than three times a week    Attends Religious Services: More than 4 times per year    Active Member of [employer] or Organizations: Yes    Attends Music therapist: More than 4 times per year    Marital Status: Married  Human resources officer Violence: Not on file     Constitutional: Patient reports headache.  Denies fever, malaise, fatigue, or abrupt weight changes.  HEENT: Denies eye pain, eye redness, ear pain, ringing in the ears, wax buildup, runny nose, nasal congestion, bloody nose, or sore throat. Respiratory: Denies difficulty breathing, shortness of breath, cough or sputum production.   Cardiovascular: Denies chest pain, chest tightness, palpitations or swelling in the hands or feet.  Gastrointestinal: Denies abdominal pain, bloating, constipation, diarrhea or blood in the stool.  GU: Denies urgency, frequency, pain with urination, burning sensation, blood in urine, odor or discharge. Musculoskeletal: Patient reports joint pain.  Denies decrease in range of motion, difficulty with gait, muscle pain or joint swelling.  Skin: Denies redness, rashes, lesions or ulcercations.  Neurological: Pt reports intermittent dizziness. Denies difficulty with memory, difficulty with speech or problems with balance and coordination.  Psych: Denies anxiety, depression, SI/HI.  No other specific complaints in a complete review of systems (except as listed in HPI above).     Objective:   Physical Exam  BP  136/86 (BP Location: Left Arm, Patient Position: Sitting, Cuff Size: Normal)   Pulse (!) 56   Temp (!) 96.9 F (36.1 C) (Temporal)   Wt 233 lb (105.7 kg)   SpO2 99%   BMI 34.41 kg/m   Wt Readings from Last 3 Encounters:  07/31/21 234 lb (106.1 kg)  05/07/21 230 lb (104.3 kg)  04/16/21 231 lb (104.8 kg)    General: Appears his stated age, obese, in NAD. Skin: Warm, dry and intact. HEENT: Head: normal shape and size; Eyes: sclera white, no icterus, conjunctiva pink, PERRLA and EOMs intact; Ears: Tm's gray and intact, normal light reflex;  Cardiovascular: Bradycardic with normal rhythm. S1,S2 noted.  No murmur, rubs or gallops noted. No JVD or BLE edema. No carotid bruits noted. Pulmonary/Chest: Normal effort and positive vesicular breath sounds. No respiratory distress. No wheezes, rales or ronchi noted.  Abdomen: Normal bowel sounds.  Musculoskeletal:  No difficulty with gait.  Neurological: Alert and oriented. Coordination normal.   BMET    Component Value Date/Time   NA 142 06/06/2021 0858   NA 145 (H) 02/05/2021 0909   K 3.9 06/06/2021 0858   CL 109 06/06/2021 0858   CO2 24 06/06/2021 0858   GLUCOSE 86 06/06/2021 0858   BUN 21 06/06/2021 0858   BUN 16 02/05/2021 0909   CREATININE 1.25 06/06/2021 0858   CALCIUM 9.0 06/06/2021 0858   GFRNONAA >60 12/26/2019 0123   GFRAA >60 10/19/2019 1337   GFRAA >60 10/19/2019 1337    Lipid Panel     Component Value Date/Time   CHOL 151 05/07/2021 1349   CHOL 143 02/05/2021 0909   TRIG 132 05/07/2021 1349   HDL 47 05/07/2021 1349   HDL 48 02/05/2021 0909   CHOLHDL 3.2 05/07/2021 1349   VLDL 22.4 01/17/2020 1214   LDLCALC 81 05/07/2021 1349    CBC    Component Value Date/Time   WBC 6.8 05/07/2021 1349   RBC 5.19 05/07/2021 1349   HGB 15.9 05/07/2021 1349   HGB 16.2 02/05/2021 0909   HCT 47.1 05/07/2021 1349   HCT  45.5 02/05/2021 0909   PLT 206 05/07/2021 1349   PLT 195 02/05/2021 0909   MCV 90.8 05/07/2021 1349    MCV 88 02/05/2021 0909   MCH 30.6 05/07/2021 1349   MCHC 33.8 05/07/2021 1349   RDW 12.2 05/07/2021 1349   RDW 12.6 02/05/2021 0909   LYMPHSABS 1.2 10/27/2019 1133   LYMPHSABS 1.6 10/31/2017 1438   MONOABS 1.0 10/27/2019 1133   EOSABS 0.0 10/27/2019 1133   EOSABS 0.2 10/31/2017 1438   BASOSABS 0.0 10/27/2019 1133   BASOSABS 0.0 10/31/2017 1438    Hgb A1C Lab Results  Component Value Date   HGBA1C 4.9 01/17/2020            Assessment & Plan:  Acute Headache:  Concerned that this might be related to blood pressure RX for Lisinopril 10 mg daily Reinforced DASH diet and exercise for weight loss  RTC in 6 months for annual exam Webb Silversmith, NP

## 2021-11-09 NOTE — Assessment & Plan Note (Signed)
Encourage weight loss as this can help reduce joint pain Continue baclofen and tramadol

## 2021-11-09 NOTE — Patient Instructions (Signed)
How to Take Your Blood Pressure Blood pressure measures how strongly your blood is pressing against the walls of your arteries. Arteries are blood vessels that carry blood from your heart throughout your body. You can take your blood pressure at home with a machine. You may need to check your blood pressure at home: To check if you have high blood pressure (hypertension). To check your blood pressure over time. To make sure your blood pressure medicine is working. Supplies needed: Blood pressure machine, or monitor. A chair to sit in. This should be a chair where you can sit upright with your back supported. Do not sit on a soft couch or an armchair. Table or desk. Small notebook. Pencil or pen. How to prepare Avoid these things for 30 minutes before checking your blood pressure: Having drinks with caffeine in them, such as coffee or tea. Drinking alcohol. Eating. Smoking. Exercising. Do these things five minutes before checking your blood pressure: Go to the bathroom and pee (urinate). Sit in a chair. Be quiet. Do not talk. How to take your blood pressure Follow the instructions that came with your machine. If you have a digital blood pressure monitor, these may be the instructions: Sit up straight. Place your feet on the floor. Do not cross your ankles or legs. Rest your left arm at the level of your heart. You may rest it on a table, desk, or chair. Pull up your shirt sleeve. Wrap the blood pressure cuff around the upper part of your left arm. The cuff should be 1 inch (2.5 cm) above your elbow. It is best to wrap the cuff around bare skin. Fit the cuff snugly around your arm, but not too tightly. You should be able to place only one finger between the cuff and your arm. Place the cord so that it rests in the bend of your elbow. Press the power button. Sit quietly while the cuff fills with air and loses air. Write down the numbers on the screen. Wait 2-3 minutes and then repeat  steps 1-10. What do the numbers mean? Two numbers make up your blood pressure. The first number is called systolic pressure. The second is called diastolic pressure. An example of a blood pressure reading is "120 over 80" (or 120/80). If you are an adult and do not have a medical condition, use this guide to find out if your blood pressure is normal: Normal First number: below 120. Second number: below 80. Elevated First number: 120-129. Second number: below 80. Hypertension stage 1 First number: 130-139. Second number: 80-89. Hypertension stage 2 First number: 140 or above. Second number: 90 or above. Your blood pressure is above normal even if only the first or only the second number is above normal. Follow these instructions at home: Medicines Take over-the-counter and prescription medicines only as told by your doctor. Tell your doctor if your medicine is causing side effects. General instructions Check your blood pressure as often as your doctor tells you to. Check your blood pressure at the same time every day. Take your monitor to your next doctor's appointment. Your doctor will: Make sure you are using it correctly. Make sure it is working right. Understand what your blood pressure numbers should be. Keep all follow-up visits. General tips You will need a blood pressure machine or monitor. Your doctor can suggest a monitor. You can buy one at a drugstore or online. When choosing one: Choose one with an arm cuff. Choose one that wraps around your   upper arm. Only one finger should fit between your arm and the cuff. Do not choose one that measures your blood pressure from your wrist or finger. Where to find more information American Heart Association: www.heart.org Contact a doctor if: Your blood pressure keeps being high. Your blood pressure is suddenly low. Get help right away if: Your first blood pressure number is higher than 180. Your second blood pressure number is  higher than 120. These symptoms may be an emergency. Do not wait to see if the symptoms will go away. Get help right away. Call 911. Summary Check your blood pressure at the same time every day. Avoid caffeine, alcohol, smoking, and exercise for 30 minutes before checking your blood pressure. Make sure you understand what your blood pressure numbers should be. This information is not intended to replace advice given to you by your health care provider. Make sure you discuss any questions you have with your health care provider. Document Revised: 09/21/2020 Document Reviewed: 09/21/2020 Elsevier Patient Education  2023 Elsevier Inc.  

## 2021-11-09 NOTE — Assessment & Plan Note (Signed)
Compensated Continue flecainide and furosemide Encourage low-salt diet and exercise for weight loss Monitor daily weights Bmet today

## 2021-11-09 NOTE — Assessment & Plan Note (Signed)
Encourage weight loss as this can help reduce sleep apnea symptoms CPAP

## 2021-11-10 ENCOUNTER — Other Ambulatory Visit: Payer: Self-pay | Admitting: Cardiovascular Disease

## 2021-11-12 NOTE — Telephone Encounter (Signed)
Prescription refill request for Eliquis received. Indication:Afib Last office visit:7/23 Scr:1.2 Age: 61 Weight:105.7 kg  Prescription refilled

## 2021-11-27 DIAGNOSIS — G4733 Obstructive sleep apnea (adult) (pediatric): Secondary | ICD-10-CM | POA: Diagnosis not present

## 2021-12-11 IMAGING — DX DG HIP (WITH OR WITHOUT PELVIS) 2V BILAT
5 series · 5 of 5 positions shown · non-contrast
Comparison: CT abdomen pelvis dated 02/25/2017.

CLINICAL DATA: 59-year-old male status post fall.

EXAM:
DG HIP (WITH OR WITHOUT PELVIS) 2V BILAT

[pelvis ap]
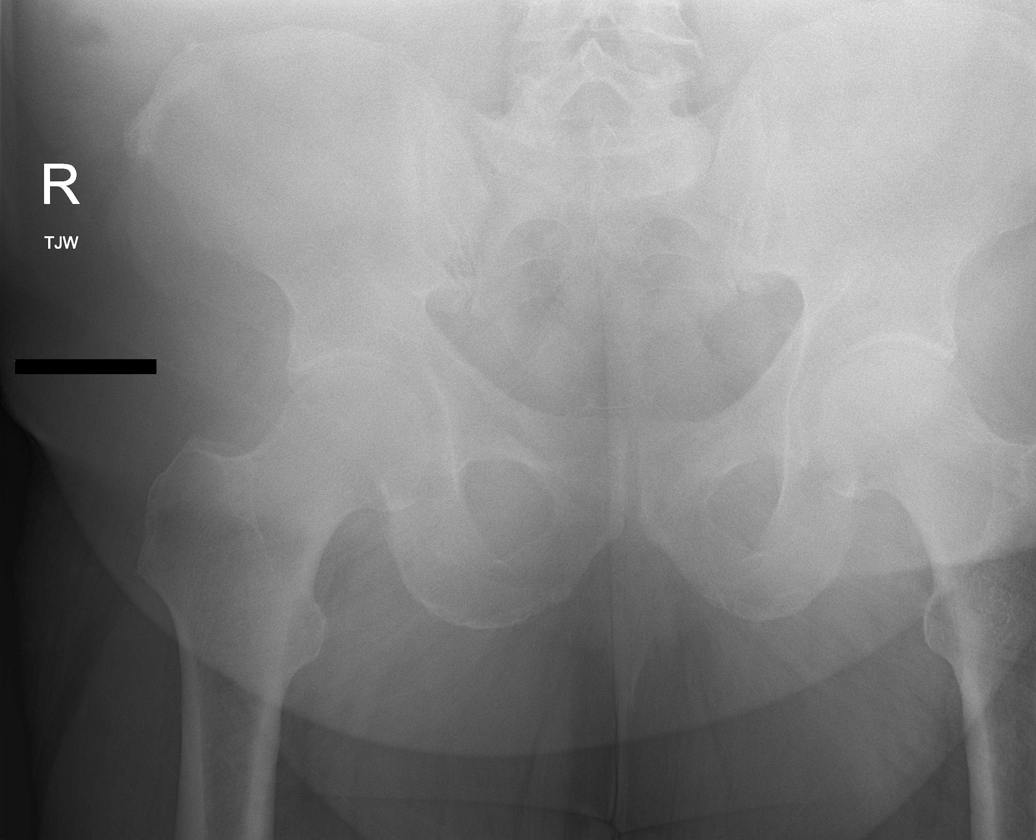

[hip ap (1 of 2)]
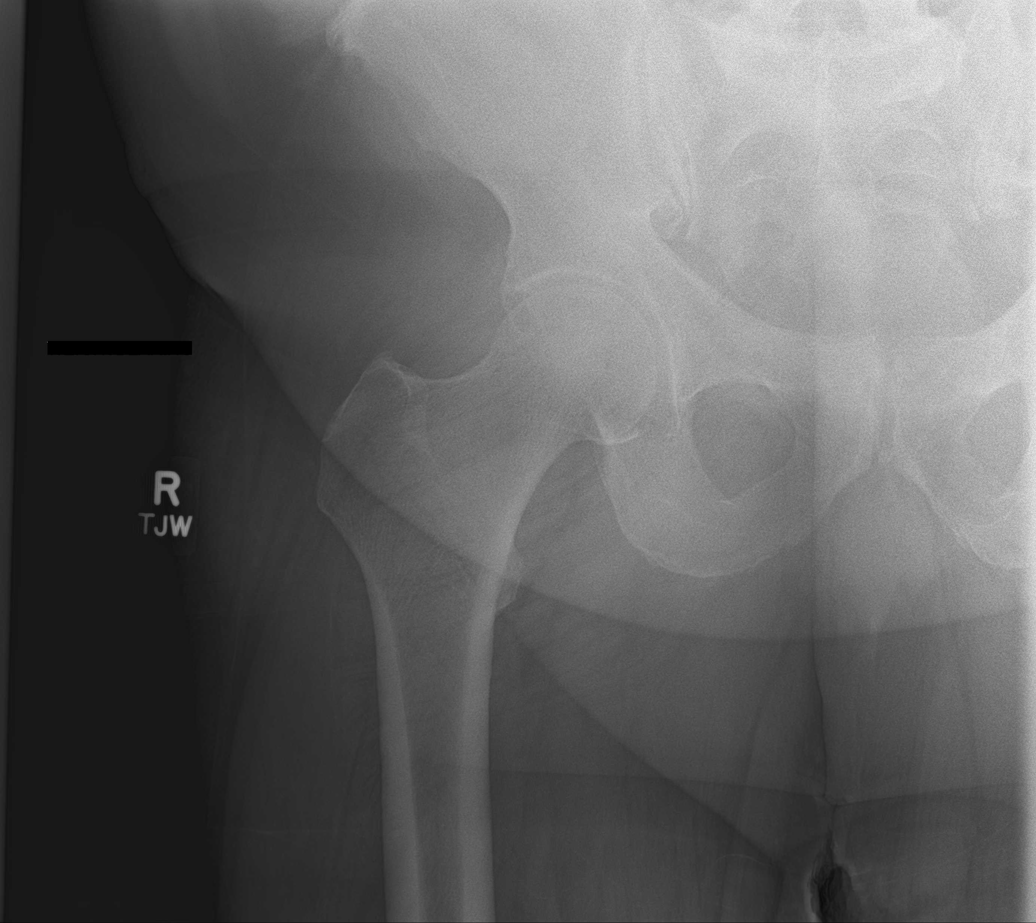

[hip lat (1 of 2)]
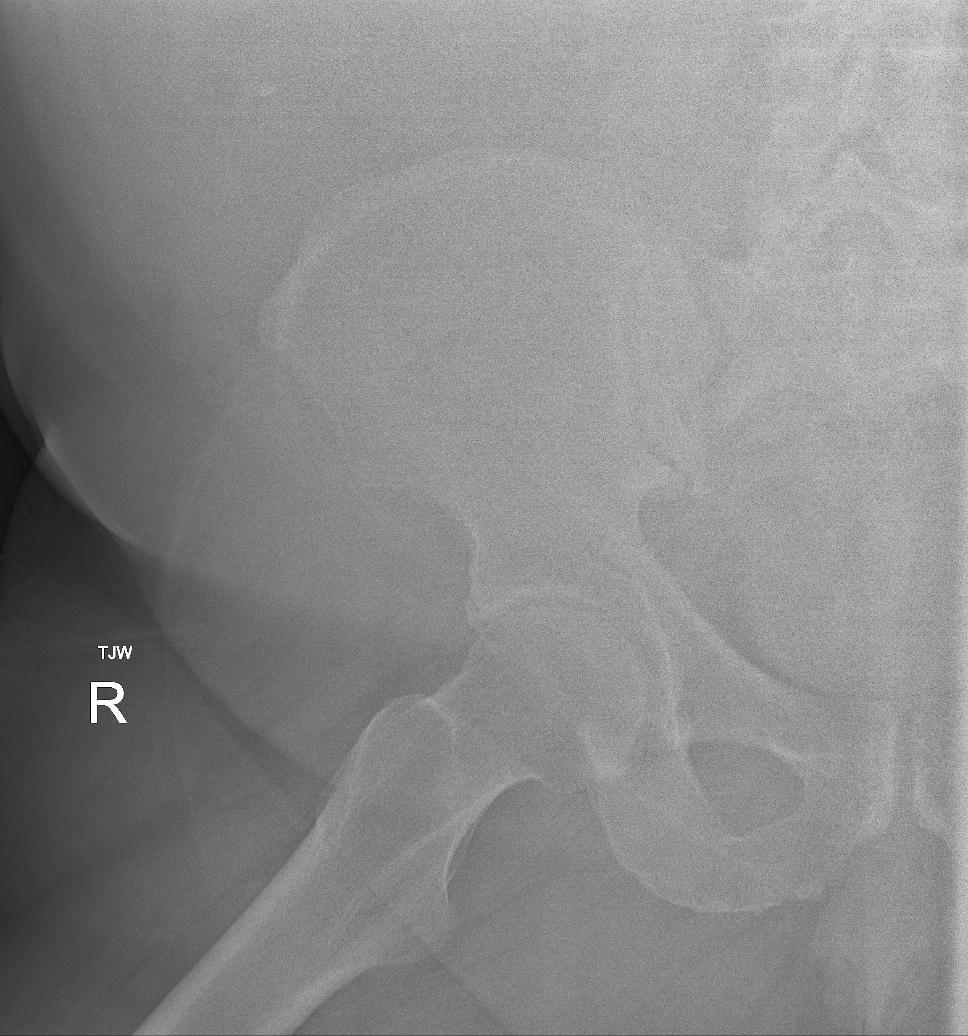

[hip ap (2 of 2)]
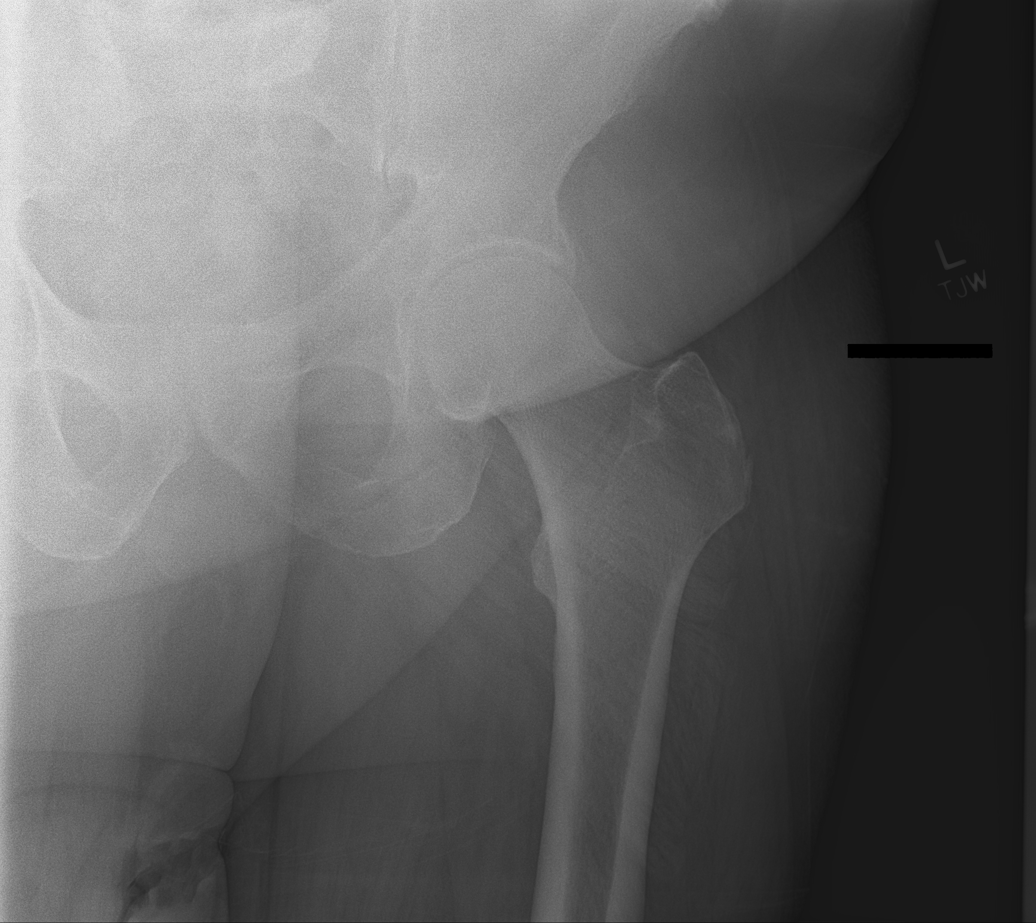

[hip lat (2 of 2)]
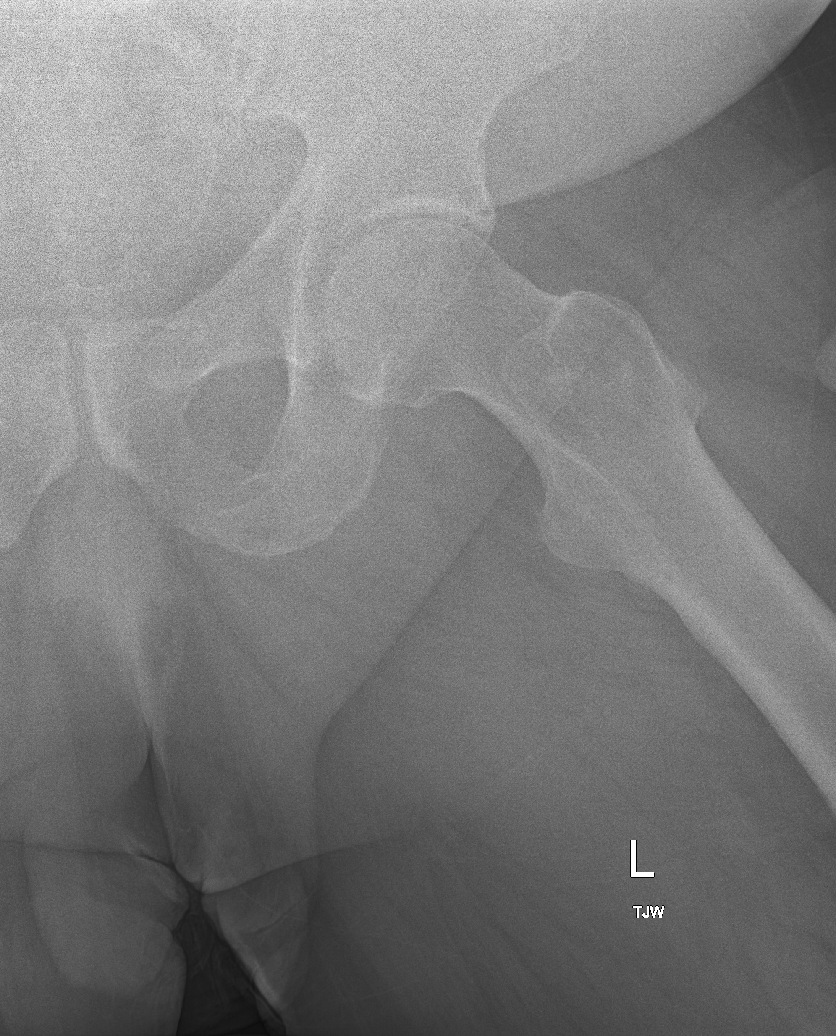

[5 of 5 positions shown; findings below may reference images not displayed]

FINDINGS: Evaluation is limited due to body habitus and soft tissue
attenuation.

There is no acute fracture or dislocation. The bones are well
mineralized. No significant arthritic changes. The soft tissues are
grossly unremarkable.
IMPRESSION: Negative.

## 2021-12-27 DIAGNOSIS — G4733 Obstructive sleep apnea (adult) (pediatric): Secondary | ICD-10-CM | POA: Diagnosis not present

## 2022-01-02 ENCOUNTER — Ambulatory Visit: Payer: Self-pay

## 2022-01-02 NOTE — Telephone Encounter (Signed)
Chief Complaint: dizziness when bending over and standing Symptoms: headache, this am BP 171/92 and repeat BP 136/82 Frequency: occasional  Pertinent Negatives: Patient denies dizziness during call, chest pain, numbness or weakness to face arms or legs. Disposition: '[]'$ ED /'[]'$ Urgent Care (no appt availability in office) / '[x]'$ Appointment(In office/virtual)/ '[]'$  [facility]/ '[]'$ Home Care/ '[]'$ Refused Recommended Disposition /'[]'$ [facility]/ '[]'$  Follow-up with PCP Additional Notes: pt stated his BP dropped before a dental appt . BP was 100/50 and he was diaphoretic and once he ate and leaned him back BP was "back to normal" Pt had an abscessed tooth and the tooth was pulled. Reason for Disposition  Taking a medicine that could cause dizziness (e.g., blood pressure medications, diuretics)  Answer Assessment - Initial Assessment Questions 1. DESCRIPTION: "Describe your dizziness."     When bent over gets  2. LIGHTHEADED: "Do you feel lightheaded?" (e.g., somewhat faint, woozy, weak upon standing)     Woozy  3. VERTIGO: "Do you feel like either you or the room is spinning or tilting?" (i.e. vertigo)     no 4. SEVERITY: "How bad is it?"  "Do you feel like you are going to faint?" "Can you stand and walk?"   - MILD: Feels slightly dizzy, but walking normally.   - MODERATE: Feels unsteady when walking, but not falling; interferes with normal activities (e.g., school, work).   - SEVERE: Unable to walk without falling, or requires assistance to walk without falling; feels like passing out now.      No dizziness 5. ONSET:  "When did the dizziness begin?"    2 days ago 6. AGGRAVATING FACTORS: "Does anything make it worse?" (e.g., standing, change in head position)     Change in head position, bending over 7. HEART RATE: "Can you tell me your heart rate?" "How many beats in 15 seconds?"  (Note: not all patients can do this)       HR 67   BP:171/92                     using wrist BP  monitor 8. CAUSE: "What do you think is causing the dizziness?"     BP 9. RECURRENT SYMPTOM: "Have you had dizziness before?" If Yes, ask: "When was the last time?" "What happened that time?"     Issue before 10. OTHER SYMPTOMS: "Do you have any other symptoms?" (e.g., fever, chest pain, vomiting, diarrhea, bleeding)       Headache 6/10 top of head, had low BP in office 100/50 11. PREGNANCY: "Is there any chance you are pregnant?" "When was your last menstrual period?"       N/a  Answer Assessment - Initial Assessment Questions 1. BLOOD PRESSURE: "What is the blood pressure?" "Did you take at least two measurements 5 minutes apart?"     171/92  repeat  136/82 HR 69 2. ONSET: "When did you take your blood pressure?"     During call 3. HOW: "How did you take your blood pressure?" (e.g., automatic home BP monitor, visiting nurse)     Wrist BP 4. HISTORY: "Do you have a history of high blood pressure?"     yes 5. MEDICINES: "Are you taking any medicines for blood pressure?" "Have you missed any doses recently?"     no 6. OTHER SYMPTOMS: "Do you have any symptoms?" (e.g., blurred vision, chest pain, difficulty breathing, headache, weakness)     Dizziness when bending over or standing, headache 6/10 7. PREGNANCY: "  Is there any chance you are pregnant?" "When was your last menstrual period?"     N/a  Protocols used: Dizziness - Lightheadedness-A-AH, Blood Pressure - High-A-AH

## 2022-01-03 ENCOUNTER — Encounter: Payer: Self-pay | Admitting: Internal Medicine

## 2022-01-03 ENCOUNTER — Ambulatory Visit (INDEPENDENT_AMBULATORY_CARE_PROVIDER_SITE_OTHER): Payer: Medicare Other | Admitting: Internal Medicine

## 2022-01-03 VITALS — BP 118/72 | HR 72 | Temp 96.8°F | Wt 229.0 lb

## 2022-01-03 DIAGNOSIS — E162 Hypoglycemia, unspecified: Secondary | ICD-10-CM | POA: Diagnosis not present

## 2022-01-03 DIAGNOSIS — I951 Orthostatic hypotension: Secondary | ICD-10-CM | POA: Diagnosis not present

## 2022-01-03 NOTE — Progress Notes (Signed)
Subjective:    Patient ID: Richard [name], male    DOB: 05/08/1960, 61 y.o.   MRN: [id number]  HPI  Patient presents to clinic today with complaint of hypotensive to episode and dizziness.  This occurred while he was at the dentist prior to getting his tooth pulled.  He is currently on Lisinopril for hypertension.  His BP today is 110/64. He reports he recently stopped sugar 1 week ago.  Review of Systems     Past Medical History:  Diagnosis Date   Arthritis    Arthrofibrosis of total knee arthroplasty ([place]) 05/30/2015   Bilateral pulmonary embolism ([place])    a. 03/2015 CTA: acute bilat PE; b. IVC filter placed in 2017--> removed 2018.   Chest pain    a. 2015 Abnl MV;  b. 2015 Cath: nl cors.   Chronic diastolic CHF (congestive heart failure) ([place])    a. 03/2015 Echo: EF 55-65%, poor windows, mildly dil RV.   DVT (deep venous thrombosis) ([place])    a. 03/2015 U/S: occlusive DVT w/in the distal aspect of the Left fem vein through the L popliteal vein.   Dyspnea    On exertion   GERD (gastroesophageal reflux disease)    Headache    Hypertension    Hypertensive heart disease    Obesity    OSA (obstructive sleep apnea)    uses CPAP nightly settings at 15    PAF (paroxysmal atrial fibrillation) ([facility])    Pneumonia    hx of years ago    Primary localized osteoarthritis of left knee    a. 11/2014 s/p L TKA.   Primary localized osteoarthritis of right knee    a. 02/2014 s/p R Partial Knee arthroplasty.   Stiffness of left knee    a. 12/2014 s/p manipulation under anesthesia.   Urine incontinence     Current Outpatient Medications  Medication Sig Dispense Refill   apixaban (ELIQUIS) 5 MG TABS tablet TAKE 1 TABLET BY MOUTH 2 TIMES DAILY 180 tablet 1   baclofen (LIORESAL) 10 MG tablet TAKE 1 TO 2 TABLETS BY MOUTH 3 TIMES DAILY AS NEEDED FOR MUSCLE SPASMS 180 each 0   colchicine 0.6 MG tablet Take 1 tablet (0.6 mg total) by mouth 2 (two) times daily as needed. 30 tablet 0   flecainide  (TAMBOCOR) 100 MG tablet TAKE 1 TABLET BY MOUTH TWICE (2) DAILY 180 tablet 0   furosemide (LASIX) 20 MG tablet Take 1 tablet (20 mg total) by mouth daily as needed. 15 tablet 0   lisinopril (ZESTRIL) 10 MG tablet Take 1 tablet (10 mg total) by mouth daily. 90 tablet 1   Multiple Vitamins-Minerals (MULTIVITAMIN WITH MINERALS) tablet Take 1 tablet by mouth daily.     pantoprazole (PROTONIX) 40 MG tablet TAKE 1 TABLET BY MOUTH ONCE A DAY 90 tablet 1   Sennosides-Docusate Sodium (SM STOOL SOFTENER/LAXATIVE PO) Take 8.6 mg by mouth daily.     traMADol (ULTRAM) 50 MG tablet TAKE 1 TABLET BY MOUTH DAILY AS NEEDED 30 tablet 0   No current facility-administered medications for this visit.    Allergies  Allergen Reactions   Penicillins Anaphylaxis and Other (See Comments)    From childhood; uncertain of reaction Has patient had a PCN reaction causing immediate rash, facial/tongue/throat swelling, SOB or lightheadedness with hypotension: Unk Has patient had a PCN reaction causing severe rash involving mucus membranes or skin necrosis: Unk Has patient had a PCN reaction that required hospitalization: Unk Has patient had a  PCN reaction occurring within the last 10 years: No If all of the above answers are "NO", then may proceed with Cephalosporin use.   Vancomycin Other (See Comments)    [place]' syndrome   Penicillins Other (See Comments)    Childhood   Vancomycin Other (See Comments)    Red man Syndrome    Family History  Problem Relation Age of Onset   Coronary artery disease Mother    Hyperlipidemia Mother    Hypertension Mother    Arthritis Mother    Coronary artery disease Father    Heart disease Paternal Grandmother    Alzheimer's disease Paternal Grandfather     Social History   Socioeconomic History   Marital status: Married    Spouse name: Hjalmar Ballengee   Number of children: 2   Years of education: 12   Highest education level: Not on file  Occupational History    Occupation: Energy manager: [employer]  Tobacco Use   Smoking status: Never   Smokeless tobacco: Never  Vaping Use   Vaping Use: Never used  Substance and Sexual Activity   Alcohol use: Not Currently    Comment: \   Drug use: No   Sexual activity: Yes  Other Topics Concern   Not on file  Social History Narrative   ** Merged History Encounter **       Social Determinants of Health   Financial Resource Strain: Low Risk  (05/07/2021)   Overall Financial Resource Strain (CARDIA)    Difficulty of Paying Living Expenses: Not hard at all  Food Insecurity: No Food Insecurity (05/07/2021)   Hunger Vital Sign    Worried About Running Out of Food in the Last Year: Never true    [place] in the Last Year: Never true  Transportation Needs: No Transportation Needs (05/07/2021)   PRAPARE - Hydrologist (Medical): No    Lack of Transportation (Non-Medical): No  Physical Activity: Not on file  Stress: No Stress Concern Present (05/07/2021)   [place]    Feeling of Stress : Not at all  Social Connections: [place] (05/07/2021)   Social Connection and Isolation Panel [NHANES]    Frequency of Communication with Friends and Family: More than three times a week    Frequency of Social Gatherings with Friends and Family: More than three times a week    Attends Religious Services: More than 4 times per year    Active Member of [employer] or Organizations: Yes    Attends Music therapist: More than 4 times per year    Marital Status: Married  Human resources officer Violence: Not on file     Constitutional: Denies fever, malaise, fatigue, headache or abrupt weight changes.  Respiratory: Denies difficulty breathing, shortness of breath, cough or sputum production.   Cardiovascular: Denies chest pain, chest tightness, palpitations or swelling in the hands or feet.  Gastrointestinal:  Denies abdominal pain, bloating, constipation, diarrhea or blood in the stool.  Musculoskeletal: Denies decrease in range of motion, difficulty with gait, muscle pain or joint pain and swelling.  Neurological: Patient reports episode of dizziness.  Denies difficulty with memory, difficulty with speech or problems with balance and coordination.    No other specific complaints in a complete review of systems (except as listed in HPI above).  Objective:   Physical Exam  BP 118/72 (BP Location: Left Arm, Patient Position: Sitting,  Cuff Size: Large)   Pulse 72   Temp (!) 96.8 F (36 C) (Temporal)   Wt 229 lb (103.9 kg)   SpO2 96%   BMI 33.82 kg/m   Wt Readings from Last 3 Encounters:  11/09/21 233 lb (105.7 kg)  07/31/21 234 lb (106.1 kg)  05/07/21 230 lb (104.3 kg)    General: Appears his stated age, obese in NAD. HEENT: Head: normal shape and size; Eyes: sclera white, no icterus, conjunctiva pink, PERRLA and EOMs intact; Ears: Tm's gray and intact, normal light reflex;  Cardiovascular: Normal rate and rhythm. S1,S2 noted.  No murmur, rubs or gallops noted.  Pulmonary/Chest: Normal effort and positive vesicular breath sounds. No respiratory distress. No wheezes, rales or ronchi noted.  Musculoskeletal: No difficulty with gait.  Neurological: Alert and oriented. Negative [place]. Cranial nerves II-XII grossly intact. Coordination normal.    BMET    Component Value Date/Time   NA 141 11/09/2021 1042   NA 145 (H) 02/05/2021 0909   K 4.1 11/09/2021 1042   CL 105 11/09/2021 1042   CO2 29 11/09/2021 1042   GLUCOSE 80 11/09/2021 1042   BUN 22 11/09/2021 1042   BUN 16 02/05/2021 0909   CREATININE 1.27 11/09/2021 1042   CALCIUM 9.3 11/09/2021 1042   GFRNONAA >60 12/26/2019 0123   GFRAA >60 10/19/2019 1337   GFRAA >60 10/19/2019 1337    Lipid Panel     Component Value Date/Time   CHOL 151 05/07/2021 1349   CHOL 143 02/05/2021 0909   TRIG 132 05/07/2021 1349   HDL 47  05/07/2021 1349   HDL 48 02/05/2021 0909   CHOLHDL 3.2 05/07/2021 1349   VLDL 22.4 01/17/2020 1214   LDLCALC 81 05/07/2021 1349    CBC    Component Value Date/Time   WBC 6.8 05/07/2021 1349   RBC 5.19 05/07/2021 1349   HGB 15.9 05/07/2021 1349   HGB 16.2 02/05/2021 0909   HCT 47.1 05/07/2021 1349   HCT 45.5 02/05/2021 0909   PLT 206 05/07/2021 1349   PLT 195 02/05/2021 0909   MCV 90.8 05/07/2021 1349   MCV 88 02/05/2021 0909   MCH 30.6 05/07/2021 1349   MCHC 33.8 05/07/2021 1349   RDW 12.2 05/07/2021 1349   RDW 12.6 02/05/2021 0909   LYMPHSABS 1.2 10/27/2019 1133   LYMPHSABS 1.6 10/31/2017 1438   MONOABS 1.0 10/27/2019 1133   EOSABS 0.0 10/27/2019 1133   EOSABS 0.2 10/31/2017 1438   BASOSABS 0.0 10/27/2019 1133   BASOSABS 0.0 10/31/2017 1438    Hgb A1C Lab Results  Component Value Date   HGBA1C 4.9 01/17/2020           Assessment & Plan:   Dizziness, Hypotension:  Orthostatics positive Could also have been slight hypoglycemia Encouraged adequate water intake Hold Lisinopril 10 mg daily CBG 83 today Make position changes slowly   RTC in 4 months for annual exam Webb Silversmith, NP

## 2022-01-03 NOTE — Patient Instructions (Signed)
Orthostatic Hypotension Blood pressure is a measurement of how strongly, or weakly, your circulating blood is pressing against the walls of your arteries. Orthostatic hypotension is a drop in blood pressure that can happen when you change positions, such as when you go from lying down to standing. Arteries are blood vessels that carry blood from your heart throughout your body. When blood pressure is too low, you may not get enough blood to your brain or to the rest of your organs. Orthostatic hypotension can cause light-headedness, sweating, rapid heartbeat, blurred vision, and fainting. These symptoms require further investigation into the cause. What are the causes? Orthostatic hypotension can be caused by many things, including: Sudden changes in posture, such as standing up quickly after you have been sitting or lying down. Loss of blood (anemia) or loss of body fluids (dehydration). Heart problems, neurologic problems, or hormone problems. Pregnancy. Aging. The risk for this condition increases as you get older. Severe infection (sepsis). Certain medicines, such as medicines for high blood pressure or medicines that make the body lose excess fluids (diuretics). What are the signs or symptoms? Symptoms of this condition may include: Weakness, light-headedness, or dizziness. Sweating. Blurred vision. Tiredness (fatigue). Rapid heartbeat. Fainting, in severe cases. How is this diagnosed? This condition is diagnosed based on: Your symptoms and medical history. Your blood pressure measurements. Your health care provider will check your blood pressure when you are: Lying down. Sitting. Standing. A blood pressure reading is recorded as two numbers, such as "120 over 80" (or 120/80). The first ("top") number is called the systolic pressure. It is a measure of the pressure in your arteries as your heart beats. The second ("bottom") number is called the diastolic pressure. It is a measure of  the pressure in your arteries when your heart relaxes between beats. Blood pressure is measured in a unit called mmHg. Healthy blood pressure for most adults is 120/80 mmHg. Orthostatic hypotension is defined as a 20 mmHg drop in systolic pressure or a 10 mmHg drop in diastolic pressure within 3 minutes of standing. Other information or tests that may be used to diagnose orthostatic hypotension include: Your other vital signs, such as your heart rate and temperature. Blood tests. An electrocardiogram (ECG) or echocardiogram. A Holter monitor. This is a device you wear that records your heart rhythm continuously, usually for 24-48 hours. Tilt table test. For this test, you will be safely secured to a table that moves you from a lying position to an upright position. Your heart rhythm and blood pressure will be monitored during the test. How is this treated? This condition may be treated by: Changing your diet. This may involve eating more salt (sodium) or drinking more water. Changing the dosage of certain medicines you are taking that might be lowering your blood pressure. Correcting the underlying reason for the orthostatic hypotension. Wearing compression stockings. Taking medicines to raise your blood pressure. Avoiding actions that trigger symptoms. Follow these instructions at home: Medicines Take over-the-counter and prescription medicines only as told by your health care provider. Follow instructions from your health care provider about changing the dosage of your current medicines, if this applies. Do not stop or adjust any of your medicines on your own. Eating and drinking  Drink enough fluid to keep your urine pale yellow. Eat extra salt only as directed. Do not add extra salt to your diet unless advised by your health care provider. Eat frequent, small meals. Avoid standing up suddenly after eating. General instructions    Get up slowly from lying down or sitting positions. This  gives your blood pressure a chance to adjust. Avoid hot showers and excessive heat as directed by your health care provider. Engage in regular physical activity as directed by your health care provider. If you have compression stockings, wear them as told. Keep all follow-up visits. This is important. Contact a health care provider if: You have a fever for more than 2-3 days. You feel more thirsty than usual. You feel dizzy or weak. Get help right away if: You have chest pain. You have a fast or irregular heartbeat. You become sweaty or feel light-headed. You feel short of breath. You faint. You have any symptoms of a stroke. "BE FAST" is an easy way to remember the main warning signs of a stroke: B - Balance. Signs are dizziness, sudden trouble walking, or loss of balance. E - Eyes. Signs are trouble seeing or a sudden change in vision. F - Face. Signs are sudden weakness or numbness of the face, or the face or eyelid drooping on one side. A - Arms. Signs are weakness or numbness in an arm. This happens suddenly and usually on one side of the body. S - Speech. Signs are sudden trouble speaking, slurred speech, or trouble understanding what people say. T - Time. Time to call emergency services. Write down what time symptoms started. You have other signs of a stroke, such as: A sudden, severe headache with no known cause. Nausea or vomiting. Seizure. These symptoms may represent a serious problem that is an emergency. Do not wait to see if the symptoms will go away. Get medical help right away. Call your local emergency services (911 in the [place] Do not drive yourself to the hospital. Summary Orthostatic hypotension is a sudden drop in blood pressure. It can cause light-headedness, sweating, rapid heartbeat, blurred vision, and fainting. Orthostatic hypotension can be diagnosed by having your blood pressure taken while lying down, sitting, and then standing. Treatment may involve  changing your diet, wearing compression stockings, sitting up slowly, adjusting your medicines, or correcting the underlying reason for the orthostatic hypotension. Get help right away if you have chest pain, a fast or irregular heartbeat, or symptoms of a stroke. This information is not intended to replace advice given to you by your health care provider. Make sure you discuss any questions you have with your health care provider. Document Revised: 03/23/2020 Document Reviewed: 03/23/2020 Elsevier Patient Education  2023 Elsevier Inc.  

## 2022-01-22 ENCOUNTER — Other Ambulatory Visit: Payer: Self-pay | Admitting: Cardiovascular Disease

## 2022-01-22 DIAGNOSIS — I82492 Acute embolism and thrombosis of other specified deep vein of left lower extremity: Secondary | ICD-10-CM

## 2022-01-22 DIAGNOSIS — I480 Paroxysmal atrial fibrillation: Secondary | ICD-10-CM

## 2022-01-22 DIAGNOSIS — R0789 Other chest pain: Secondary | ICD-10-CM

## 2022-01-22 DIAGNOSIS — I2699 Other pulmonary embolism without acute cor pulmonale: Secondary | ICD-10-CM

## 2022-01-27 DIAGNOSIS — G4733 Obstructive sleep apnea (adult) (pediatric): Secondary | ICD-10-CM | POA: Diagnosis not present

## 2022-01-31 ENCOUNTER — Ambulatory Visit: Payer: Medicare Other | Attending: Cardiovascular Disease | Admitting: Cardiovascular Disease

## 2022-01-31 ENCOUNTER — Encounter: Payer: Self-pay | Admitting: Cardiovascular Disease

## 2022-01-31 VITALS — BP 120/78 | HR 57 | Ht 69.0 in | Wt 230.0 lb

## 2022-01-31 DIAGNOSIS — Z86711 Personal history of pulmonary embolism: Secondary | ICD-10-CM

## 2022-01-31 DIAGNOSIS — I480 Paroxysmal atrial fibrillation: Secondary | ICD-10-CM

## 2022-01-31 DIAGNOSIS — G4733 Obstructive sleep apnea (adult) (pediatric): Secondary | ICD-10-CM | POA: Diagnosis not present

## 2022-01-31 NOTE — Patient Instructions (Signed)
Medication Instructions:  No changes *If you need a refill on your cardiac medications before your next appointment, please call your pharmacy*   Lab Work: None ordered If you have labs (blood work) drawn today and your tests are completely normal, you will receive your results only by: MyChart Message (if you have MyChart) OR A paper copy in the mail If you have any lab test that is abnormal or we need to change your treatment, we will call you to review the results.   Testing/Procedures: None ordered   Follow-Up: At [facility], you and your health needs are our priority.  As part of our continuing mission to provide you with exceptional heart care, we have created designated Provider Care Teams.  These Care Teams include your primary Cardiologist (physician) and Advanced Practice Providers (APPs -  Physician Assistants and Nurse Practitioners) who all work together to provide you with the care you need, when you need it.  We recommend signing up for the patient portal called "MyChart".  Sign up information is provided on this After Visit Summary.  MyChart is used to connect with patients for Virtual Visits (Telemedicine).  Patients are able to view lab/test results, encounter notes, upcoming appointments, etc.  Non-urgent messages can be sent to your provider as well.   To learn more about what you can do with MyChart, go to [email or web address].    Your next appointment:   6 month(s)  Provider:   You may see Muhammad Arida, MD or one of the following Advanced Practice Providers on your designated Care Team:   Christopher Berge, NP Ryan Dunn, PA-C Cadence Furth, PA-C Sheri Hammock, NP    

## 2022-01-31 NOTE — Progress Notes (Signed)
Cardiology Office Note   Date:  01/31/2022   ID:  Richard [name], DOB 08-13-1960, MRN [id number]  PCP:  Jearld Fenton, NP  Cardiologist:   Kathlyn Sacramento, MD   Chief Complaint  Patient presents with   Other    6 month f/u no complaints today. Meds reviewed verbally with pt.      History of Present Illness: Richard [name] is a 62 y.o. male who presents for a follow-up visit regarding paroxysmal atrial fibrillation . The patient has past medical history of essential hypertension, obesity status post gastric sleeve surgery, osteoarthritis status post bilateral knee surgeries.  Previous cardiac catheterization 2015 showed normal coronary arteries.  The patient had multiple knee surgeries starting in 2016. He developed left lower extremity DVT complicated by massive pulmonary embolism which was treated with catheter-based intervention and IVC filter placement.  He had atrial fibrillation at that time but converted to sinus rhythm. He had intermittent palpitations thought to be due to atrial fibrillation and has been maintaining in sinus rhythm with flecainide. Lifelong anticoagulation has been recommended.  His atrial fibrillation is being treated with flecainide and anticoagulation.  Metoprolol was discontinued in the past due to symptomatic bradycardia.  He has been doing well and denies any chest pain, shortness of breath or palpitations.  Past Medical History:  Diagnosis Date   Arthritis    Arthrofibrosis of total knee arthroplasty ([place]) 05/30/2015   Bilateral pulmonary embolism ([place])    a. 03/2015 CTA: acute bilat PE; b. IVC filter placed in 2017--> removed 2018.   Chest pain    a. 2015 Abnl MV;  b. 2015 Cath: nl cors.   Chronic diastolic CHF (congestive heart failure) ([place])    a. 03/2015 Echo: EF 55-65%, poor windows, mildly dil RV.   DVT (deep venous thrombosis) ([place])    a. 03/2015 U/S: occlusive DVT w/in the distal aspect of the Left fem vein through the L popliteal  vein.   Dyspnea    On exertion   GERD (gastroesophageal reflux disease)    Headache    Hypertension    Hypertensive heart disease    Obesity    OSA (obstructive sleep apnea)    uses CPAP nightly settings at 15    PAF (paroxysmal atrial fibrillation) ([facility])    Pneumonia    hx of years ago    Primary localized osteoarthritis of left knee    a. 11/2014 s/p L TKA.   Primary localized osteoarthritis of right knee    a. 02/2014 s/p R Partial Knee arthroplasty.   Stiffness of left knee    a. 12/2014 s/p manipulation under anesthesia.   Urine incontinence     Past Surgical History:  Procedure Laterality Date   APPENDECTOMY     CARDIAC CATHETERIZATION     CARPAL TUNNEL RELEASE Left 03/2018   CYSTOSCOPY W/ RETROGRADES  12/09/2019   Procedure: CYSTOSCOPY WITH RETROGRADE PYELOGRAM;  Surgeon: Royston Cowper, MD;  Location: [facility];  Service: Urology;;   CYSTOSCOPY WITH STENT PLACEMENT Right 12/09/2019   Procedure: CYSTOSCOPY WITH STENT PLACEMENT;  Surgeon: Royston Cowper, MD;  Location: [facility];  Service: Urology;  Laterality: Right;   EXAM UNDER ANESTHESIA WITH MANIPULATION OF KNEE Left 05/30/2015   Procedure: EXAM UNDER ANESTHESIA WITH MANIPULATION OF LEFT KNEE;  Surgeon: Marchia Bond, MD;  Location: [place];  Service: Orthopedics;  Laterality: Left;   HIATAL HERNIA REPAIR N/A 10/26/2019   Procedure: HIATAL HERNIA REPAIR;  Surgeon: Johnathan Hausen, MD;  Location: [facility];  Service: General;  Laterality: N/A;   I & D KNEE WITH POLY EXCHANGE Left 10/30/2015   Procedure: IRRIGATION AND DEBRIDEMENT KNEE WITH POLY EXCHANGE PLACE SPACERS;  Surgeon: Frederik Pear, MD;  Location: [place];  Service: Orthopedics;  Laterality: Left;  OFF ELIQUIST 3 DAYS   IVC FILTER PLACEMENT ([facility] HX)  04/13/15   IVC FILTER REMOVAL N/A 08/13/2016   Procedure: IVC Filter Removal;  Surgeon: Katha Cabal, MD;  Location: [place] [facility];  Service: Cardiovascular;  Laterality: N/A;   JOINT REPLACEMENT     KNEE  ARTHROSCOPY Left 08/08/2015   Procedure: LEFT KNEE MANIPULATION WITH ARTHROSCOPIC LYSIS OF ADHESIONS AND ASPIRATION;  Surgeon: Marchia Bond, MD;  Location: [place];  Service: Orthopedics;  Laterality: Left;   KNEE CLOSED REDUCTION Left 01/20/2015   Procedure: LEFT KNEE MANIPULATION;  Surgeon: Marchia Bond, MD;  Location: [place];  Service: Orthopedics;  Laterality: Left;   LAPAROSCOPIC GASTRIC SLEEVE RESECTION N/A 10/26/2019   Procedure: LAPAROSCOPIC SLEEVE GASTRECTOMY;  Surgeon: Johnathan Hausen, MD;  [facility];  Service: General;  Laterality: N/A;   LEFT HEART CATHETERIZATION WITH CORONARY ANGIOGRAM N/A 01/12/2014   Procedure: LEFT HEART CATHETERIZATION WITH CORONARY ANGIOGRAM;  Surgeon: Jettie Booze, MD;  Location: [facility];  Service: Cardiovascular;  Laterality: N/A;   ORTHOPEDIC SURGERY Left    arthroscopy x3   PARTIAL KNEE ARTHROPLASTY Right 02/25/2014   Procedure: RIGHT KNEE ARTHROPLASTY CONDYLE AND PLATEAU MEDIAL COMPARTMENT ;  Surgeon: Johnny Bridge, MD;  Location: [place];  Service: Orthopedics;  Laterality: Right;   PERIPHERAL VASCULAR CATHETERIZATION N/A 03/29/2015   Procedure: IVC Filter Insertion, and possible thrombectomy;  Surgeon: Katha Cabal, MD;  Location: [place] [facility];  Service: Cardiovascular;  Laterality: N/A;   ROTATOR CUFF REPAIR Left    TOTAL KNEE ARTHROPLASTY  12/06/2014   Procedure: TOTAL KNEE ARTHROPLASTY;  Surgeon: Marchia Bond, MD;  Location: [place];  Service: Orthopedics;;   TOTAL KNEE REVISION Left 02/08/2016   Procedure: TOTAL KNEE REVISION;  Surgeon: Frederik Pear, MD;  Location: [place];  Service: Orthopedics;  Laterality: Left;   UPPER GI ENDOSCOPY N/A 10/26/2019   Procedure: UPPER GI ENDOSCOPY;  Surgeon: Johnathan Hausen, MD;  [facility];  Service: General;  Laterality: N/A;   URETEROSCOPY WITH HOLMIUM LASER LITHOTRIPSY Right 12/30/2019   Procedure: URETEROSCOPY WITH HOLMIUM LASER LITHOTRIPSY;   Surgeon: Royston Cowper, MD;  Location: [facility];  Service: Urology;  Laterality: Right;     Current Outpatient Medications  Medication Sig Dispense Refill   apixaban (ELIQUIS) 5 MG TABS tablet TAKE 1 TABLET BY MOUTH 2 TIMES DAILY 180 tablet 1   baclofen (LIORESAL) 10 MG tablet TAKE 1 TO 2 TABLETS BY MOUTH 3 TIMES DAILY AS NEEDED FOR MUSCLE SPASMS 180 each 0   colchicine 0.6 MG tablet Take 1 tablet (0.6 mg total) by mouth 2 (two) times daily as needed. 30 tablet 0   flecainide (TAMBOCOR) 100 MG tablet TAKE 1 TABLET BY MOUTH TWICE (2) DAILY 180 tablet 0   furosemide (LASIX) 20 MG tablet Take 1 tablet (20 mg total) by mouth daily as needed. 15 tablet 0   Multiple Vitamins-Minerals (MULTIVITAMIN WITH MINERALS) tablet Take 1 tablet by mouth daily.     pantoprazole (PROTONIX) 40 MG tablet TAKE 1 TABLET BY MOUTH ONCE A DAY 90 tablet 1   Sennosides-Docusate Sodium (SM STOOL SOFTENER/LAXATIVE PO) Take 8.6 mg by mouth daily.  traMADol (ULTRAM) 50 MG tablet TAKE 1 TABLET BY MOUTH DAILY AS NEEDED 30 tablet 0   No current facility-administered medications for this visit.    Allergies:   Penicillins, Vancomycin, Penicillins, and Vancomycin    Social History:  The patient  reports that he has never smoked. He has never used smokeless tobacco. He reports that he does not currently use alcohol. He reports that he does not use drugs.   Family History:  The patient's family history includes Alzheimer's disease in his paternal grandfather; Arthritis in his mother; Coronary artery disease in his father and mother; Heart disease in his paternal grandmother; Hyperlipidemia in his mother; Hypertension in his mother.    ROS:  Please see the history of present illness.   Otherwise, review of systems are positive for none.   All other systems are reviewed and negative.    PHYSICAL EXAM: VS:  BP 120/78 (BP Location: Left Arm, Patient Position: Sitting, Cuff Size: Large)   Pulse (!) 57   Ht '5\' 9"'$  (1.753 m)    Wt 230 lb (104.3 kg)   SpO2 97%   BMI 33.97 kg/m  , BMI Body mass index is 33.97 kg/m. GEN: Well nourished, well developed, in no acute distress  HEENT: normal  Neck: no JVD, carotid bruits, or masses Cardiac: RRR; no murmurs, rubs, or gallops,no edema  Respiratory:  clear to auscultation bilaterally, normal work of breathing GI: soft, nontender, nondistended, + BS MS: no deformity or atrophy  Skin: warm and dry, no rash Neuro:  Strength and sensation are intact Psych: euthymic mood, full affect   EKG:  EKG is ordered today. The ekg ordered today demonstrates sinus bradycardia with first-degree AV block left anterior fascicular block.   Recent Labs: 05/07/2021: ALT 18; Hemoglobin 15.9; Platelets 206 11/09/2021: BUN 22; Creat 1.27; Potassium 4.1; Sodium 141    Lipid Panel    Component Value Date/Time   CHOL 151 05/07/2021 1349   CHOL 143 02/05/2021 0909   TRIG 132 05/07/2021 1349   HDL 47 05/07/2021 1349   HDL 48 02/05/2021 0909   CHOLHDL 3.2 05/07/2021 1349   VLDL 22.4 01/17/2020 1214   LDLCALC 81 05/07/2021 1349      Wt Readings from Last 3 Encounters:  01/31/22 230 lb (104.3 kg)  01/03/22 229 lb (103.9 kg)  11/09/21 233 lb (105.7 kg)       ASSESSMENT AND PLAN:  1.  Paroxysmal atrial fibrillation:  No evidence of recurrent arrhythmia on flecainide. He is tolerating anticoagulation with Eliquis.   2. history of pulmonary embolism: The patient is on lifelong anticoagulation for this as well as paroxysmal atrial fibrillation.  3.  Status post gastric sleeve surgery for morbid obesity: His weight is stable overall.  4.  Obstructive sleep apnea: He is compliant with CPAP at night.   Disposition:   FU with me in 6 months  Signed,  Kathlyn Sacramento, MD  01/31/2022 2:33 PM    [place]

## 2022-02-27 DIAGNOSIS — G4733 Obstructive sleep apnea (adult) (pediatric): Secondary | ICD-10-CM | POA: Diagnosis not present

## 2022-03-11 ENCOUNTER — Other Ambulatory Visit: Payer: Self-pay | Admitting: Internal Medicine

## 2022-03-12 ENCOUNTER — Other Ambulatory Visit (HOSPITAL_BASED_OUTPATIENT_CLINIC_OR_DEPARTMENT_OTHER): Payer: Self-pay

## 2022-03-12 NOTE — Telephone Encounter (Signed)
Future visit in 1 month. Requested Prescriptions  Pending Prescriptions Disp Refills   pantoprazole (PROTONIX) 40 MG tablet [Pharmacy Med Name: PANTOPRAZOLE SODIUM 40 MG DR TAB] 90 tablet 1    Sig: TAKE 1 TABLET BY MOUTH ONCE A DAY     Gastroenterology: Proton Pump Inhibitors Passed - 03/11/2022  9:09 AM      Passed - Valid encounter within last 12 months    Recent Outpatient Visits           2 months ago Orthostatic hypotension   [facility] [place], [place] W, NP   4 months ago CHF (congestive heart failure), NYHA class II, chronic, diastolic [facility])   [facility] [place], Coralie Keens, NP   10 months ago Encounter for general adult medical examination with abnormal findings   [facility] [place], Coralie Keens, NP   1 year ago Acute idiopathic gout of right ankle   [facility] [place], Coralie Keens, NP   1 year ago Acute drug-induced gout of right ankle   [facility] [place], Coralie Keens, NP       Future Appointments             In 1 month [place], Coralie Keens, NP [facility], [facility]

## 2022-03-27 ENCOUNTER — Other Ambulatory Visit: Payer: Self-pay

## 2022-03-27 MED ORDER — COLCHICINE 0.6 MG PO TABS: 0.6000 mg | ORAL_TABLET | Freq: Two times a day (BID) | ORAL | 0 refills | Status: DC | PRN

## 2022-03-27 NOTE — Telephone Encounter (Signed)
Copied from [place] [id number]. Topic: General - Other >> Mar 26, 2022  4:03 PM Dominique A wrote: Reason for CRM: Pt is calling to see if PCP can call him in some medication for his gout.  Please advise.

## 2022-03-28 DIAGNOSIS — G4733 Obstructive sleep apnea (adult) (pediatric): Secondary | ICD-10-CM | POA: Diagnosis not present

## 2022-04-09 ENCOUNTER — Other Ambulatory Visit: Payer: Self-pay | Admitting: Cardiovascular Disease

## 2022-04-09 ENCOUNTER — Telehealth: Payer: Self-pay | Admitting: Internal Medicine

## 2022-04-09 DIAGNOSIS — I480 Paroxysmal atrial fibrillation: Secondary | ICD-10-CM

## 2022-04-09 DIAGNOSIS — I82492 Acute embolism and thrombosis of other specified deep vein of left lower extremity: Secondary | ICD-10-CM

## 2022-04-09 DIAGNOSIS — R0789 Other chest pain: Secondary | ICD-10-CM

## 2022-04-09 DIAGNOSIS — I2699 Other pulmonary embolism without acute cor pulmonale: Secondary | ICD-10-CM

## 2022-04-09 NOTE — Telephone Encounter (Signed)
Contacted Tressia Miners to schedule their annual wellness visit. Patient declined to schedule AWV at this time. Patient has AWV scheduled w/[facility] 05/19/22  Sherol [place]; [facility] Direct Dial: [phone]

## 2022-04-10 NOTE — Telephone Encounter (Signed)
Pt last saw Dr Fletcher Anon 01/31/22, last labs 11/09/21 Creat 1.27, age 62, weight 104.3kg, based on specified criteria pt is on appropriate dosage of Eliquis 5mg  BID for afib.  Will refill rx.

## 2022-04-10 NOTE — Telephone Encounter (Signed)
Please assist patient with Eliquis refill, thank you

## 2022-05-09 ENCOUNTER — Encounter: Payer: Self-pay | Admitting: Internal Medicine

## 2022-05-09 ENCOUNTER — Ambulatory Visit (INDEPENDENT_AMBULATORY_CARE_PROVIDER_SITE_OTHER): Payer: Medicare Other | Admitting: Internal Medicine

## 2022-05-09 VITALS — BP 130/70 | HR 65 | Ht 69.0 in | Wt 236.4 lb

## 2022-05-09 DIAGNOSIS — Z125 Encounter for screening for malignant neoplasm of prostate: Secondary | ICD-10-CM | POA: Diagnosis not present

## 2022-05-09 DIAGNOSIS — M1A0790 Idiopathic chronic gout, unspecified ankle and foot, without tophus (tophi): Secondary | ICD-10-CM

## 2022-05-09 DIAGNOSIS — R7309 Other abnormal glucose: Secondary | ICD-10-CM

## 2022-05-09 DIAGNOSIS — E786 Lipoprotein deficiency: Secondary | ICD-10-CM

## 2022-05-09 DIAGNOSIS — Z0001 Encounter for general adult medical examination with abnormal findings: Secondary | ICD-10-CM

## 2022-05-09 DIAGNOSIS — Z6834 Body mass index (BMI) 34.0-34.9, adult: Secondary | ICD-10-CM

## 2022-05-09 DIAGNOSIS — E6609 Other obesity due to excess calories: Secondary | ICD-10-CM

## 2022-05-09 DIAGNOSIS — E538 Deficiency of other specified B group vitamins: Secondary | ICD-10-CM | POA: Diagnosis not present

## 2022-05-09 NOTE — Assessment & Plan Note (Signed)
Encourage diet and exercise for weight loss 

## 2022-05-09 NOTE — Progress Notes (Signed)
Subjective:    Patient ID: Richard [name], male    DOB: 10/06/1960, 62 y.o.   MRN: [id number]  HPI  Patient presents to clinic today for his annual exam.  Flu: 10/2021 Tetanus: 04/2021 COVID: Gala Murdoch x 2 Shingrix: x 2 [facility] PSA screening: 04/2021 Colon screening: 01/2020, Cologuard Vision screening: annually Dentist: biannually  Diet: He does eat meat. He consumes fruits and veggies. He rarely eats fried foods. He drinks iced tea and water. Exercise: Walking  Review of Systems     Past Medical History:  Diagnosis Date   Arthritis    Arthrofibrosis of total knee arthroplasty (HCC) 05/30/2015   Bilateral pulmonary embolism (HCC)    a. 03/2015 CTA: acute bilat PE; b. IVC filter placed in 2017--> removed 2018.   Chest pain    a. 2015 Abnl MV;  b. 2015 Cath: nl cors.   Chronic diastolic CHF (congestive heart failure) (HCC)    a. 03/2015 Echo: EF 55-65%, poor windows, mildly dil RV.   DVT (deep venous thrombosis) (HCC)    a. 03/2015 U/S: occlusive DVT w/in the distal aspect of the Left fem vein through the L popliteal vein.   Dyspnea    On exertion   GERD (gastroesophageal reflux disease)    Headache    Hypertension    Hypertensive heart disease    Obesity    OSA (obstructive sleep apnea)    uses CPAP nightly settings at 15    PAF (paroxysmal atrial fibrillation) (HCC)    Pneumonia    hx of years ago    Primary localized osteoarthritis of left knee    a. 11/2014 s/p L TKA.   Primary localized osteoarthritis of right knee    a. 02/2014 s/p R Partial Knee arthroplasty.   Stiffness of left knee    a. 12/2014 s/p manipulation under anesthesia.   Urine incontinence     Current Outpatient Medications  Medication Sig Dispense Refill   baclofen (LIORESAL) 10 MG tablet TAKE 1 TO 2 TABLETS BY MOUTH 3 TIMES DAILY AS NEEDED FOR MUSCLE SPASMS 180 each 0   colchicine 0.6 MG tablet Take 1 tablet (0.6 mg total) by mouth 2 (two) times daily as needed. 30 tablet 0    ELIQUIS 5 MG TABS tablet TAKE ONE TABLET BY MOUTH TWO TIMES DAILY 180 tablet 1   flecainide (TAMBOCOR) 100 MG tablet Take (1) one tablet by mouth (2) twice daily 180 tablet 2   furosemide (LASIX) 20 MG tablet Take 1 tablet (20 mg total) by mouth daily as needed. 15 tablet 0   Multiple Vitamins-Minerals (MULTIVITAMIN WITH MINERALS) tablet Take 1 tablet by mouth daily.     pantoprazole (PROTONIX) 40 MG tablet TAKE 1 TABLET BY MOUTH ONCE A DAY 90 tablet 1   Sennosides-Docusate Sodium (SM STOOL SOFTENER/LAXATIVE PO) Take 8.6 mg by mouth daily.     traMADol (ULTRAM) 50 MG tablet TAKE 1 TABLET BY MOUTH DAILY AS NEEDED 30 tablet 0   No current facility-administered medications for this visit.    Allergies  Allergen Reactions   Penicillins Anaphylaxis and Other (See Comments)    From childhood; uncertain of reaction Has patient had a PCN reaction causing immediate rash, facial/tongue/throat swelling, SOB or lightheadedness with hypotension: Unk Has patient had a PCN reaction causing severe rash involving mucus membranes or skin necrosis: Unk Has patient had a PCN reaction that required hospitalization: Unk Has patient had a PCN reaction occurring within the last 10 years: No If all  of the above answers are "NO", then may proceed with Cephalosporin use.   Vancomycin Other (See Comments)    Red Mans' syndrome   Penicillins Other (See Comments)    Childhood   Vancomycin Other (See Comments)    Red man Syndrome    Family History  Problem Relation Age of Onset   Coronary artery disease Mother    Hyperlipidemia Mother    Hypertension Mother    Arthritis Mother    Coronary artery disease Father    Heart disease Paternal Grandmother    Alzheimer's disease Paternal Grandfather     Social History   Socioeconomic History   Marital status: Married    Spouse name: Lionel Woodberry   Number of children: 2   Years of education: 12   Highest education level: Not on file  Occupational History    Occupation: Acupuncturist: Engineer, civil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) One  Tobacco Use   Smoking status: Never   Smokeless tobacco: Never  Vaping Use   Vaping Use: Never used  Substance and Sexual Activity   Alcohol use: Not Currently    Comment: \   Drug use: No   Sexual activity: Yes  Other Topics Concern   Not on file  Social History Narrative   ** Merged History Encounter **       Social Determinants of Health   Financial Resource Strain: Low Risk  (05/07/2021)   Overall Financial Resource Strain (CARDIA)    Difficulty of Paying Living Expenses: Not hard at all  Food Insecurity: No Food Insecurity (05/07/2021)   Hunger Vital Sign    Worried About Running Out of Food in the Last Year: Never true    Ran Out of Food in the Last Year: Never true  Transportation Needs: No Transportation Needs (05/07/2021)   PRAPARE - Administrator, Civil Service (Medical): No    Lack of Transportation (Non-Medical): No  Physical Activity: Not on file  Stress: No Stress Concern Present (05/07/2021)   [employer] of Occupational Health - Occupational Stress Questionnaire    Feeling of Stress : Not at all  Social Connections: Socially Integrated (05/07/2021)   Social Connection and Isolation Panel [NHANES]    Frequency of Communication with Friends and Family: More than three times a week    Frequency of Social Gatherings with Friends and Family: More than three times a week    Attends Religious Services: More than 4 times per year    Active Member of [employer] or Organizations: Yes    Attends Engineer, structural: More than 4 times per year    Marital Status: Married  Catering manager Violence: Not on file     Constitutional: Denies fever, malaise, fatigue, headache or abrupt weight changes.  HEENT: Denies eye pain, eye redness, ear pain, ringing in the ears, wax buildup, runny nose, nasal congestion, bloody nose, or sore throat. Respiratory: Denies difficulty breathing, shortness of breath, cough or  sputum production.   Cardiovascular: Denies chest pain, chest tightness, palpitations or swelling in the hands or feet.  Gastrointestinal: Pt reports intermittent reflux. Denies abdominal pain, bloating, constipation, diarrhea or blood in the stool.  GU: Denies urgency, frequency, pain with urination, burning sensation, blood in urine, odor or discharge. Musculoskeletal: Patient reports joint pain.  Denies decrease in range of motion, difficulty with gait, muscle pain or joint swelling.  Skin: Denies redness, rashes, lesions or ulcercations.  Neurological: Denies dizziness, difficulty with memory, difficulty with speech or problems with balance  and coordination.  Psych: Denies anxiety, depression, SI/HI.  No other specific complaints in a complete review of systems (except as listed in HPI above).  Objective:   Physical Exam   BP 130/70 (BP Location: Left Arm, Patient Position: Sitting)   Pulse 65   Ht  (1.753 m)   Wt 236 lb 6.4 oz (107.2 kg)   SpO2 99%   BMI 34.91 kg/m   Wt Readings from Last 3 Encounters:  01/31/22 230 lb (104.3 kg)  01/03/22 229 lb (103.9 kg)  11/09/21 233 lb (105.7 kg)    General: Appears his stated age, obese, in NAD. Skin: Warm, dry and intact.  HEENT: Head: normal shape and size; Eyes: sclera white, no icterus, conjunctiva pink, PERRLA and EOMs intact;  Neck:  Neck supple, trachea midline. No masses, lumps or thyromegaly present.  Cardiovascular: Normal rate and rhythm. S1,S2 noted.  No murmur, rubs or gallops noted. No JVD or BLE edema. No carotid bruits noted. Pulmonary/Chest: Normal effort and positive vesicular breath sounds. No respiratory distress. No wheezes, rales or ronchi noted.  Abdomen: Normal bowel sounds.  Musculoskeletal: Strength 5/5 BUE/BLE.  No difficulty with gait.  Neurological: Alert and oriented. Cranial nerves II-XII grossly intact. Coordination normal.  Psychiatric: Mood and affect normal. Behavior is normal. Judgment and  thought content normal.     BMET    Component Value Date/Time   NA 141 11/09/2021 1042   NA 145 (H) 02/05/2021 0909   K 4.1 11/09/2021 1042   CL 105 11/09/2021 1042   CO2 29 11/09/2021 1042   GLUCOSE 80 11/09/2021 1042   BUN 22 11/09/2021 1042   BUN 16 02/05/2021 0909   CREATININE 1.27 11/09/2021 1042   CALCIUM 9.3 11/09/2021 1042   GFRNONAA >60 12/26/2019 0123   GFRAA >60 10/19/2019 1337   GFRAA >60 10/19/2019 1337    Lipid Panel     Component Value Date/Time   CHOL 151 05/07/2021 1349   CHOL 143 02/05/2021 0909   TRIG 132 05/07/2021 1349   HDL 47 05/07/2021 1349   HDL 48 02/05/2021 0909   CHOLHDL 3.2 05/07/2021 1349   VLDL 22.4 01/17/2020 1214   LDLCALC 81 05/07/2021 1349    CBC    Component Value Date/Time   WBC 6.8 05/07/2021 1349   RBC 5.19 05/07/2021 1349   HGB 15.9 05/07/2021 1349   HGB 16.2 02/05/2021 0909   HCT 47.1 05/07/2021 1349   HCT 45.5 02/05/2021 0909   PLT 206 05/07/2021 1349   PLT 195 02/05/2021 0909   MCV 90.8 05/07/2021 1349   MCV 88 02/05/2021 0909   MCH 30.6 05/07/2021 1349   MCHC 33.8 05/07/2021 1349   RDW 12.2 05/07/2021 1349   RDW 12.6 02/05/2021 0909   LYMPHSABS 1.2 10/27/2019 1133   LYMPHSABS 1.6 10/31/2017 1438   MONOABS 1.0 10/27/2019 1133   EOSABS 0.0 10/27/2019 1133   EOSABS 0.2 10/31/2017 1438   BASOSABS 0.0 10/27/2019 1133   BASOSABS 0.0 10/31/2017 1438    Hgb A1C Lab Results  Component Value Date   HGBA1C 4.9 01/17/2020          Assessment & Plan:   Preventative Health Maintenance:  Encouraged him to get a flu shot in the fall Tetanus UTD Encouraged him to get his COVID booster Colon screening UTD Encouraged him to consume a balanced diet and exercise regimen Advised to see an eye doctor and dentist annually We will check CBC, c-Met, lipid, A1c, PSA, uric acid and B12  today  RTC in 6 months, follow-up chronic conditions Webb Silversmith, NP

## 2022-05-09 NOTE — Patient Instructions (Signed)
Health Maintenance, Male Adopting a healthy lifestyle and getting preventive care are important in promoting health and wellness. Ask your health care provider about: The right schedule for you to have regular tests and exams. Things you can do on your own to prevent diseases and keep yourself healthy. What should I know about diet, weight, and exercise? Eat a healthy diet  Eat a diet that includes plenty of vegetables, fruits, low-fat dairy products, and lean protein. Do not eat a lot of foods that are high in solid fats, added sugars, or sodium. Maintain a healthy weight Body mass index (BMI) is a measurement that can be used to identify possible weight problems. It estimates body fat based on height and weight. Your health care provider can help determine your BMI and help you achieve or maintain a healthy weight. Get regular exercise Get regular exercise. This is one of the most important things you can do for your health. Most adults should: Exercise for at least 150 minutes each week. The exercise should increase your heart rate and make you sweat (moderate-intensity exercise). Do strengthening exercises at least twice a week. This is in addition to the moderate-intensity exercise. Spend less time sitting. Even light physical activity can be beneficial. Watch cholesterol and blood lipids Have your blood tested for lipids and cholesterol at 61 years of age, then have this test every 5 years. You may need to have your cholesterol levels checked more often if: Your lipid or cholesterol levels are high. You are older than 61 years of age. You are at high risk for heart disease. What should I know about cancer screening? Many types of cancers can be detected early and may often be prevented. Depending on your health history and family history, you may need to have cancer screening at various ages. This may include screening for: Colorectal cancer. Prostate cancer. Skin cancer. Lung  cancer. What should I know about heart disease, diabetes, and high blood pressure? Blood pressure and heart disease High blood pressure causes heart disease and increases the risk of stroke. This is more likely to develop in people who have high blood pressure readings or are overweight. Talk with your health care provider about your target blood pressure readings. Have your blood pressure checked: Every 3-5 years if you are 18-39 years of age. Every year if you are 40 years old or older. If you are between the ages of 65 and 75 and are a current or former smoker, ask your health care provider if you should have a one-time screening for abdominal aortic aneurysm (AAA). Diabetes Have regular diabetes screenings. This checks your fasting blood sugar level. Have the screening done: Once every three years after age 45 if you are at a normal weight and have a low risk for diabetes. More often and at a younger age if you are overweight or have a high risk for diabetes. What should I know about preventing infection? Hepatitis B If you have a higher risk for hepatitis B, you should be screened for this virus. Talk with your health care provider to find out if you are at risk for hepatitis B infection. Hepatitis C Blood testing is recommended for: Everyone born from 1945 through 1965. Anyone with known risk factors for hepatitis C. Sexually transmitted infections (STIs) You should be screened each year for STIs, including gonorrhea and chlamydia, if: You are sexually active and are younger than 61 years of age. You are older than 61 years of age and your   health care provider tells you that you are at risk for this type of infection. Your sexual activity has changed since you were last screened, and you are at increased risk for chlamydia or gonorrhea. Ask your health care provider if you are at risk. Ask your health care provider about whether you are at high risk for HIV. Your health care provider  may recommend a prescription medicine to help prevent HIV infection. If you choose to take medicine to prevent HIV, you should first get tested for HIV. You should then be tested every 3 months for as long as you are taking the medicine. Follow these instructions at home: Alcohol use Do not drink alcohol if your health care provider tells you not to drink. If you drink alcohol: Limit how much you have to 0-2 drinks a day. Know how much alcohol is in your drink. In the [place] one drink equals one 12 oz bottle of beer (355 mL), one 5 oz glass of wine (148 mL), or one 1 oz glass of hard liquor (44 mL). Lifestyle Do not use any products that contain nicotine or tobacco. These products include cigarettes, chewing tobacco, and vaping devices, such as e-cigarettes. If you need help quitting, ask your health care provider. Do not use street drugs. Do not share needles. Ask your health care provider for help if you need support or information about quitting drugs. General instructions Schedule regular health, dental, and eye exams. Stay current with your vaccines. Tell your health care provider if: You often feel depressed. You have ever been abused or do not feel safe at home. Summary Adopting a healthy lifestyle and getting preventive care are important in promoting health and wellness. Follow your health care provider's instructions about healthy diet, exercising, and getting tested or screened for diseases. Follow your health care provider's instructions on monitoring your cholesterol and blood pressure. This information is not intended to replace advice given to you by your health care provider. Make sure you discuss any questions you have with your health care provider. Document Revised: 05/29/2020 Document Reviewed: 05/29/2020 Elsevier Patient Education  2023 Elsevier Inc.  

## 2022-05-10 LAB — COMPLETE METABOLIC PANEL WITH GFR
AG Ratio: 1.8 (calc) (ref 1.0–2.5)
ALT: 20 U/L (ref 9–46)
AST: 20 U/L (ref 10–35)
Albumin: 4.3 g/dL (ref 3.6–5.1)
Alkaline phosphatase (APISO): 72 U/L (ref 35–144)
BUN: 18 mg/dL (ref 7–25)
CO2: 27 mmol/L (ref 20–32)
Calcium: 9.6 mg/dL (ref 8.6–10.3)
Chloride: 106 mmol/L (ref 98–110)
Creat: 1.3 mg/dL (ref 0.70–1.35)
Globulin: 2.4 g/dL (calc) (ref 1.9–3.7)
Glucose, Bld: 83 mg/dL (ref 65–99)
Potassium: 4 mmol/L (ref 3.5–5.3)
Sodium: 141 mmol/L (ref 135–146)
Total Bilirubin: 0.6 mg/dL (ref 0.2–1.2)
Total Protein: 6.7 g/dL (ref 6.1–8.1)
eGFR: 63 mL/min/{1.73_m2} (ref >=60)

## 2022-05-10 LAB — PSA: PSA: 0.27 ng/mL (ref <=4.00)

## 2022-05-10 LAB — CBC
HCT: 46.2 % (ref 38.5–50.0)
Hemoglobin: 15.8 g/dL (ref 13.2–17.1)
MCH: 31 pg (ref 27.0–33.0)
MCHC: 34.2 g/dL (ref 32.0–36.0)
MCV: 90.6 fL (ref 80.0–100.0)
MPV: 10.9 fL (ref 7.5–12.5)
Platelets: 182 10*3/uL (ref 140–400)
RBC: 5.1 10*6/uL (ref 4.20–5.80)
RDW: 12.5 % (ref 11.0–15.0)
WBC: 6 10*3/uL (ref 3.8–10.8)

## 2022-05-10 LAB — LIPID PANEL
Cholesterol: 162 mg/dL (ref <200)
HDL: 47 mg/dL (ref >=40)
LDL Cholesterol (Calc): 98 mg/dL (calc)
Non-HDL Cholesterol (Calc): 115 mg/dL (calc) (ref <130)
Total CHOL/HDL Ratio: 3.4 (calc) (ref <5.0)
Triglycerides: 82 mg/dL (ref <150)

## 2022-05-10 LAB — HEMOGLOBIN A1C
Hgb A1c MFr Bld: 5.2 % of total Hgb (ref <5.7)
Mean Plasma Glucose: 103 mg/dL
eAG (mmol/L): 5.7 mmol/L

## 2022-05-10 LAB — URIC ACID: Uric Acid, Serum: 8.5 mg/dL — ABNORMAL HIGH (ref 4.0–8.0)

## 2022-05-10 LAB — VITAMIN B12: Vitamin B-12: 440 pg/mL (ref 200–1100)

## 2022-05-13 ENCOUNTER — Telehealth: Payer: Self-pay

## 2022-05-13 MED ORDER — ALLOPURINOL 100 MG PO TABS: 100.0000 mg | ORAL_TABLET | Freq: Every day | ORAL | 0 refills | Status: DC

## 2022-05-13 MED ORDER — TRAMADOL HCL 50 MG PO TABS: 50.0000 mg | ORAL_TABLET | Freq: Every day | ORAL | 0 refills | Status: DC | PRN

## 2022-05-13 NOTE — Telephone Encounter (Signed)
Copied from CRM [id number]. Topic: General - Other >> May 10, 2022  2:58 PM Everette C wrote: Reason for CRM: The patient has called to follow up on ongoing discussions related to their prescriptions of allopurinol and tramadol  Please contact the patient further when possible

## 2022-05-13 NOTE — Telephone Encounter (Addendum)
Patient needs a tramadol refill and was wondering if he needs to start taking allopurinol.

## 2022-05-13 NOTE — Telephone Encounter (Signed)
I have refilled the tramadol.  I have also sent in and the allopurinol for him to start taking daily.  His recent uric acid level was elevated and so taking allopurinol will reduce his risk of having a gout flare.

## 2022-05-14 NOTE — Telephone Encounter (Signed)
Patient aware.

## 2022-05-14 NOTE — Progress Notes (Signed)
(  Key: [facility])   Your information has been sent to [facility].

## 2022-05-29 ENCOUNTER — Telehealth: Payer: Self-pay | Admitting: Internal Medicine

## 2022-05-29 NOTE — Telephone Encounter (Signed)
Contacted Richard [name] to schedule their annual wellness visit. Call back at later date: REQ CB 06/03/2022  Verlee Rossetti; Care Guide Ambulatory Clinical Support [place] [facility] Direct Dial: [phone]

## 2022-05-31 ENCOUNTER — Telehealth: Payer: Self-pay | Admitting: Internal Medicine

## 2022-05-31 NOTE — Telephone Encounter (Signed)
Called patient to schedule Medicare Annual Wellness Visit (AWV). Left message for patient to call back and schedule Medicare Annual Wellness Visit (AWV).  Last date of AWV: 05/07/21  Please schedule an appointment at any time with Lorrie [name], LPN   If any questions, please contact me.  Thank you ,  Aisa Schoeppner Harris-Coley; Care Guide Ambulatory Clinical Support [place] [facility] Direct Dial: [phone]   

## 2022-06-10 ENCOUNTER — Other Ambulatory Visit: Payer: Self-pay | Admitting: Internal Medicine

## 2022-06-11 NOTE — Telephone Encounter (Signed)
Requested medications are due for refill today.  yes  Requested medications are on the active medications list.  yes  Last refill. 05/13/2022   #30 0 rf  Future visit scheduled.   yes  Notes to clinic.   Refill not delegated.    Requested Prescriptions  Pending Prescriptions Disp Refills   traMADol (ULTRAM) 50 MG tablet [Pharmacy Med Name: TRAMADOL HYDROCHLORIDE 50MG  TABLET] 30 tablet 0    Sig: TAKE ONE TABLET (50 MG TOTAL) BY MOUTH DAILY AS NEEDED.     Not Delegated - Analgesics:  Opioid Agonists Failed - 06/10/2022  9:14 AM      Failed - This refill cannot be delegated      Failed - Urine Drug Screen completed in last 360 days      Passed - Valid encounter within last 3 months    Recent Outpatient Visits           1 month ago Encounter for general adult medical examination with abnormal findings   [place] [facility] [place], Salvadore Oxford, NP   5 months ago Orthostatic hypotension   [place] [facility] [place], [place] W, NP   7 months ago CHF (congestive heart failure), NYHA class II, chronic, diastolic [facility])   [place] [facility] [place], Salvadore Oxford, NP   1 year ago Encounter for general adult medical examination with abnormal findings   [place] [facility] [place], Salvadore Oxford, NP   1 year ago Acute idiopathic gout of right ankle   [place] [facility] [place], Salvadore Oxford, NP       Future Appointments             In 5 months [place], Salvadore Oxford, NP [place] [facility]

## 2022-06-14 ENCOUNTER — Encounter: Payer: Self-pay | Admitting: Internal Medicine

## 2022-06-14 ENCOUNTER — Ambulatory Visit (INDEPENDENT_AMBULATORY_CARE_PROVIDER_SITE_OTHER): Payer: Medicare Other | Admitting: Internal Medicine

## 2022-06-14 ENCOUNTER — Ambulatory Visit: Payer: Self-pay

## 2022-06-14 VITALS — BP 136/84 | HR 71 | Temp 97.7°F | Wt 225.0 lb

## 2022-06-14 DIAGNOSIS — M1A0790 Idiopathic chronic gout, unspecified ankle and foot, without tophus (tophi): Secondary | ICD-10-CM | POA: Diagnosis not present

## 2022-06-14 MED ORDER — ALLOPURINOL 100 MG PO TABS: 100.0000 mg | ORAL_TABLET | Freq: Every day | ORAL | 0 refills | Status: DC

## 2022-06-14 MED ORDER — PREDNISONE 10 MG PO TABS: ORAL_TABLET | ORAL | 0 refills | Status: DC

## 2022-06-14 NOTE — Patient Instructions (Signed)
Gout  Gout is painful swelling of your joints. Gout is a type of arthritis. It is caused by having too much uric acid in your body. Uric acid is a chemical that is made when your body breaks down substances called purines. If your body has too much uric acid, sharp crystals can form and build up in your joints. This causes pain and swelling. Gout attacks can happen quickly and be very painful (acute gout). Over time, the attacks can affect more joints and happen more often (chronic gout). What are the causes? Gout is caused by too much uric acid in your blood. This can happen because: Your kidneys do not remove enough uric acid from your blood. Your body makes too much uric acid. You eat too many foods that are high in purines. These foods include organ meats, some seafood, and beer. Trauma or stress can bring on an attack. What increases the risk? Having a family history of gout. Being male and middle-aged. Being male and having gone through menopause. Having an organ transplant. Taking certain medicines. Having certain conditions, such as: Being very overweight (obese). Lead poisoning. Kidney disease. A skin condition called psoriasis. Other risks include: Losing weight too quickly. Not having enough water in the body (being dehydrated). Drinking alcohol, especially beer. Drinking beverages that are sweetened with a type of sugar called fructose. What are the signs or symptoms? An attack of acute gout often starts at night and usually happens in just one joint. The most common place is the big toe. Other joints that may be affected include joints of the feet, ankle, knee, fingers, wrist, or elbow. Symptoms may include: Very bad pain. Warmth. Swelling. Stiffness. Tenderness. The affected joint may be very painful to touch. Shiny, red, or purple skin. Chills and fever. Chronic gout may cause symptoms more often. More joints may be involved. You may also have white or yellow lumps  (tophi) on your hands or feet or in other areas near your joints. How is this treated? Treatment for an acute attack may include medicines for pain and swelling, such as: NSAIDs, such as ibuprofen. Steroids taken by mouth or injected into a joint. Colchicine. This can be given by mouth or through an IV tube. Treatment to prevent future attacks may include: Taking small doses of NSAIDs or colchicine daily. Using a medicine that reduces uric acid levels in your blood, such as allopurinol. Making changes to your diet. You may need to see a food expert (dietitian) about what to eat and drink to prevent gout. Follow these instructions at home: During a gout attack  If told, put ice on the painful area. To do this: Put ice in a plastic bag. Place a towel between your skin and the bag. Leave the ice on for 20 minutes, 2-3 times a day. Take off the ice if your skin turns bright red. This is very important. If you cannot feel pain, heat, or cold, you have a greater risk of damage to the area. Raise the painful joint above the level of your heart as often as you can. Rest the joint as much as possible. If the joint is in your leg, you may be given crutches. Follow instructions from your doctor about what you cannot eat or drink. Avoiding future gout attacks Eat a low-purine diet. Avoid foods and drinks such as: Liver. Kidney. Anchovies. Asparagus. Herring. Mushrooms. Mussels. Beer. Stay at a healthy weight. If you want to lose weight, talk with your doctor. Do not  lose weight too fast. Start or continue an exercise plan as told by your doctor. Eating and drinking Avoid drinks sweetened by fructose. Drink enough fluids to keep your pee (urine) pale yellow. If you drink alcohol: Limit how much you have to: 0-1 drink a day for women who are not pregnant. 0-2 drinks a day for men. Know how much alcohol is in a drink. In the [place] one drink equals one 12 oz bottle of beer (355 mL), one 5 oz  glass of wine (148 mL), or one 1 oz glass of hard liquor (44 mL). General instructions Take over-the-counter and prescription medicines only as told by your doctor. Ask your doctor if you should avoid driving or using machines while you are taking your medicine. Return to your normal activities when your doctor says that it is safe. Keep all follow-up visits. Where to find more information [employer]: www.niams.http://www.myers.net/ Contact a doctor if: You have another gout attack. You still have symptoms of a gout attack after 10 days of treatment. You have problems (side effects) because of your medicines. You have chills or a fever. You have burning pain when you pee (urinate). You have pain in your lower back or belly. Get help right away if: You have very bad pain. Your pain cannot be controlled. You cannot pee. Summary Gout is painful swelling of the joints. The most common site of pain is the big toe, but it can affect other joints. Medicines and avoiding some foods can help to prevent and treat gout attacks. This information is not intended to replace advice given to you by your health care provider. Make sure you discuss any questions you have with your health care provider. Document Revised: 10/11/2020 Document Reviewed: 10/11/2020 Elsevier Patient Education  2024 [employer].

## 2022-06-14 NOTE — Progress Notes (Signed)
Subjective:    Patient ID: Richard [name], male    DOB: 10/19/1960, 62 y.o.   MRN: [id number]  HPI  Pt presents to the clinic today left ankle pain, swelling and difficulty with gait.  He reports this started this morning.  He describes the pain as sharp and stabbing.  The pain worsens with ambulation.  He has not noticed any redness or warmth.  He has a history of gout, as prescribed Allopurinol and Colchicine.  He reports he thinks he may be only taking 1 tab of Colchicine per day and not the Allopurinol at all.  He has not tried anything OTC for this.   Review of Systems     Past Medical History:  Diagnosis Date   Arthritis    Arthrofibrosis of total knee arthroplasty (HCC) 05/30/2015   Bilateral pulmonary embolism (HCC)    a. 03/2015 CTA: acute bilat PE; b. IVC filter placed in 2017--> removed 2018.   Chest pain    a. 2015 Abnl MV;  b. 2015 Cath: nl cors.   Chronic diastolic CHF (congestive heart failure) (HCC)    a. 03/2015 Echo: EF 55-65%, poor windows, mildly dil RV.   DVT (deep venous thrombosis) (HCC)    a. 03/2015 U/S: occlusive DVT w/in the distal aspect of the Left fem vein through the L popliteal vein.   Dyspnea    On exertion   GERD (gastroesophageal reflux disease)    Headache    Hypertension    Hypertensive heart disease    Obesity    OSA (obstructive sleep apnea)    uses CPAP nightly settings at 15    PAF (paroxysmal atrial fibrillation) (HCC)    Pneumonia    hx of years ago    Primary localized osteoarthritis of left knee    a. 11/2014 s/p L TKA.   Primary localized osteoarthritis of right knee    a. 02/2014 s/p R Partial Knee arthroplasty.   Stiffness of left knee    a. 12/2014 s/p manipulation under anesthesia.   Urine incontinence     Current Outpatient Medications  Medication Sig Dispense Refill   allopurinol (ZYLOPRIM) 100 MG tablet Take 1 tablet (100 mg total) by mouth daily. 90 tablet 0   baclofen (LIORESAL) 10 MG tablet TAKE 1 TO 2 TABLETS BY  MOUTH 3 TIMES DAILY AS NEEDED FOR MUSCLE SPASMS 180 each 0   colchicine 0.6 MG tablet Take 1 tablet (0.6 mg total) by mouth 2 (two) times daily as needed. 30 tablet 0   ELIQUIS 5 MG TABS tablet TAKE ONE TABLET BY MOUTH TWO TIMES DAILY 180 tablet 1   flecainide (TAMBOCOR) 100 MG tablet Take (1) one tablet by mouth (2) twice daily 180 tablet 2   furosemide (LASIX) 20 MG tablet Take 1 tablet (20 mg total) by mouth daily as needed. 15 tablet 0   Multiple Vitamins-Minerals (MULTIVITAMIN WITH MINERALS) tablet Take 1 tablet by mouth daily.     pantoprazole (PROTONIX) 40 MG tablet TAKE 1 TABLET BY MOUTH ONCE A DAY 90 tablet 1   Sennosides-Docusate Sodium (SM STOOL SOFTENER/LAXATIVE PO) Take 8.6 mg by mouth daily.     traMADol (ULTRAM) 50 MG tablet TAKE ONE TABLET (50 MG TOTAL) BY MOUTH DAILY AS NEEDED. 30 tablet 0   No current facility-administered medications for this visit.    Allergies  Allergen Reactions   Penicillins Anaphylaxis and Other (See Comments)    From childhood; uncertain of reaction Has patient had a PCN reaction  causing immediate rash, facial/tongue/throat swelling, SOB or lightheadedness with hypotension: Unk Has patient had a PCN reaction causing severe rash involving mucus membranes or skin necrosis: Unk Has patient had a PCN reaction that required hospitalization: Unk Has patient had a PCN reaction occurring within the last 10 years: No If all of the above answers are "NO", then may proceed with Cephalosporin use.   Vancomycin Other (See Comments)    Red Mans' syndrome   Penicillins Other (See Comments)    Childhood   Vancomycin Other (See Comments)    Red man Syndrome    Family History  Problem Relation Age of Onset   Coronary artery disease Mother    Hyperlipidemia Mother    Hypertension Mother    Arthritis Mother    Coronary artery disease Father    Heart disease Paternal Grandmother    Alzheimer's disease Paternal Grandfather     Social History    Socioeconomic History   Marital status: Married    Spouse name: Emry Blase   Number of children: 2   Years of education: 12   Highest education level: Not on file  Occupational History   Occupation: Acupuncturist: Engineer, civil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) [employer]  Tobacco Use   Smoking status: Never   Smokeless tobacco: Never  Vaping Use   Vaping Use: Never used  Substance and Sexual Activity   Alcohol use: Not Currently    Comment: \   Drug use: No   Sexual activity: Yes  Other Topics Concern   Not on file  Social History Narrative   ** Merged History Encounter **       Social Determinants of Health   Financial Resource Strain: Low Risk  (05/07/2021)   Overall Financial Resource Strain (CARDIA)    Difficulty of Paying Living Expenses: Not hard at all  Food Insecurity: No Food Insecurity (05/07/2021)   Hunger Vital Sign    Worried About Running Out of Food in the Last Year: Never true    Ran Out of Food in the Last Year: Never true  Transportation Needs: No Transportation Needs (05/07/2021)   PRAPARE - Administrator, Civil Service (Medical): No    Lack of Transportation (Non-Medical): No  Physical Activity: Not on file  Stress: No Stress Concern Present (05/07/2021)   [employer] of Occupational Health - Occupational Stress Questionnaire    Feeling of Stress : Not at all  Social Connections: Socially Integrated (05/07/2021)   Social Connection and Isolation Panel [NHANES]    Frequency of Communication with Friends and Family: More than three times a week    Frequency of Social Gatherings with Friends and Family: More than three times a week    Attends Religious Services: More than 4 times per year    Active Member of [employer] or Organizations: Yes    Attends Engineer, structural: More than 4 times per year    Marital Status: Married  Catering manager Violence: Not on file     Constitutional: Denies fever, malaise, fatigue, headache or abrupt weight changes.  Respiratory:  Denies difficulty breathing, shortness of breath, cough or sputum production.   Cardiovascular: Denies chest pain, chest tightness, palpitations or swelling in the hands or feet.  Musculoskeletal: Pt reports left ankle pain and swelling, difficulty with gait. Denies decrease in range of motion, muscle pain.  Skin: Denies redness, rashes, lesions or ulcercations.   No other specific complaints in a complete review of systems (except as listed in HPI above).  Objective:   Physical Exam  BP 136/84 (BP Location: Left Arm, Patient Position: Sitting, Cuff Size: Normal)   Pulse 71   Temp 97.7 F (36.5 C) (Temporal)   Wt 225 lb (102.1 kg) Comment: Per pt  SpO2 98%   BMI 33.23 kg/m   Wt Readings from Last 3 Encounters:  05/09/22 236 lb 6.4 oz (107.2 kg)  01/31/22 230 lb (104.3 kg)  01/03/22 229 lb (103.9 kg)    General: Appears his stated age, obese, in NAD. Skin: Warm, dry and intact. No redness or warmth noted. Cardiovascular: Normal rate and rhythm.  Pulmonary/Chest: Normal effort and positive vesicular breath sounds. No respiratory distress. No wheezes, rales or ronchi noted.  Musculoskeletal: Decreased flexion, extension, rotation of the left ankle secondary to pain.  1+ swelling noted inferior to the lateral malleolus.  In wheelchair today. Neurological: Alert and oriented.    BMET    Component Value Date/Time   NA 141 05/09/2022 0955   NA 145 (H) 02/05/2021 0909   K 4.0 05/09/2022 0955   CL 106 05/09/2022 0955   CO2 27 05/09/2022 0955   GLUCOSE 83 05/09/2022 0955   BUN 18 05/09/2022 0955   BUN 16 02/05/2021 0909   CREATININE 1.30 05/09/2022 0955   CALCIUM 9.6 05/09/2022 0955   GFRNONAA >60 12/26/2019 0123   GFRAA >60 10/19/2019 1337   GFRAA >60 10/19/2019 1337    Lipid Panel     Component Value Date/Time   CHOL 162 05/09/2022 0955   CHOL 143 02/05/2021 0909   TRIG 82 05/09/2022 0955   HDL 47 05/09/2022 0955   HDL 48 02/05/2021 0909   CHOLHDL 3.4 05/09/2022  0955   VLDL 22.4 01/17/2020 1214   LDLCALC 98 05/09/2022 0955    CBC    Component Value Date/Time   WBC 6.0 05/09/2022 0955   RBC 5.10 05/09/2022 0955   HGB 15.8 05/09/2022 0955   HGB 16.2 02/05/2021 0909   HCT 46.2 05/09/2022 0955   HCT 45.5 02/05/2021 0909   PLT 182 05/09/2022 0955   PLT 195 02/05/2021 0909   MCV 90.6 05/09/2022 0955   MCV 88 02/05/2021 0909   MCH 31.0 05/09/2022 0955   MCHC 34.2 05/09/2022 0955   RDW 12.5 05/09/2022 0955   RDW 12.6 02/05/2021 0909   LYMPHSABS 1.2 10/27/2019 1133   LYMPHSABS 1.6 10/31/2017 1438   MONOABS 1.0 10/27/2019 1133   EOSABS 0.0 10/27/2019 1133   EOSABS 0.2 10/31/2017 1438   BASOSABS 0.0 10/27/2019 1133   BASOSABS 0.0 10/31/2017 1438    Hgb A1C Lab Results  Component Value Date   HGBA1C 5.2 05/09/2022            Assessment & Plan:   Gout of Left Ankle:  80 mg Depo-Medrol IM Advised him that he should be taking allopurinol daily and colchicine as needed for gout flares Rx for Pred taper x 6 days to start taking if symptoms worsen   RTC in 5 months for follow up chronic conditions Nicki Reaper, NP

## 2022-06-14 NOTE — Telephone Encounter (Signed)
   Chief Complaint: Pain left foot/ankle."It's my gout acting up and I'm taking the medicine." Symptoms: Pain 12/10 Frequency: Today Pertinent Negatives: Patient denies  Disposition: [] ED /[] Urgent Care (no appt availability in office) / [x] Appointment(In office/virtual)/ []  [place] [facility]/ [] Home Care/ [] Refused Recommended Disposition /[] [place] [facility]/ []  Follow-up with PCP Additional Notes: Appointment per Fleet Contras in the practice.   Reason for Disposition  [1] SEVERE pain (e.g., excruciating, unable to do any normal activities) AND [2] not improved after 2 hours of pain medicine  Answer Assessment - Initial Assessment Questions 1. ONSET: "When did the pain start?"      Today 2. LOCATION: "Where is the pain located?"      Left foot/ankle 3. PAIN: "How bad is the pain?"    (Scale 1-10; or mild, moderate, severe)  - MILD (1-3): doesn't interfere with normal activities.   - MODERATE (4-7): interferes with normal activities (e.g., work or school) or awakens from sleep, limping.   - SEVERE (8-10): excruciating pain, unable to do any normal activities, unable to walk.      Severe 4. WORK OR EXERCISE: "Has there been any recent work or exercise that involved this part of the body?"      No 5. CAUSE: "What do you think is causing the foot pain?"     Gout 6. OTHER SYMPTOMS: "Do you have any other symptoms?" (e.g., leg pain, rash, fever, numbness)     Pain 7. PREGNANCY: "Is there any chance you are pregnant?" "When was your last menstrual period?"     N/a  Protocols used: Foot Pain-A-AH

## 2022-06-18 ENCOUNTER — Other Ambulatory Visit: Payer: Self-pay | Admitting: Internal Medicine

## 2022-06-19 NOTE — Telephone Encounter (Signed)
Requested Prescriptions  Pending Prescriptions Disp Refills   colchicine 0.6 MG tablet [Pharmacy Med Name: COLCHICINE 0.6MG  TABLET] 180 tablet 0    Sig: TAKE 1 TABLET BY MOUTH TWICE A DAY     Endocrinology:  Gout Agents - colchicine Failed - 06/18/2022  9:35 AM      Failed - CBC within normal limits and completed in the last 12 months    WBC  Date Value Ref Range Status  05/09/2022 6.0 3.8 - 10.8 Thousand/uL Final   RBC  Date Value Ref Range Status  05/09/2022 5.10 4.20 - 5.80 Million/uL Final   Hemoglobin  Date Value Ref Range Status  05/09/2022 15.8 13.2 - 17.1 g/dL Final  16/10/9602 54.0 13.0 - 17.7 g/dL Final   HCT  Date Value Ref Range Status  05/09/2022 46.2 38.5 - 50.0 % Final   Hematocrit  Date Value Ref Range Status  02/05/2021 45.5 37.5 - 51.0 % Final   MCHC  Date Value Ref Range Status  05/09/2022 34.2 32.0 - 36.0 g/dL Final   [facility]  Date Value Ref Range Status  05/09/2022 31.0 27.0 - 33.0 pg Final   MCV  Date Value Ref Range Status  05/09/2022 90.6 80.0 - 100.0 fL Final  02/05/2021 88 79 - 97 fL Final   No results found for: "PLTCOUNTKUC", "LABPLAT", "POCPLA" RDW  Date Value Ref Range Status  05/09/2022 12.5 11.0 - 15.0 % Final  02/05/2021 12.6 11.6 - 15.4 % Final         Passed - Cr in normal range and within 360 days    Creat  Date Value Ref Range Status  05/09/2022 1.30 0.70 - 1.35 mg/dL Final         Passed - ALT in normal range and within 360 days    ALT  Date Value Ref Range Status  05/09/2022 20 9 - 46 U/L Final         Passed - AST in normal range and within 360 days    AST  Date Value Ref Range Status  05/09/2022 20 10 - 35 U/L Final         Passed - Valid encounter within last 12 months    Recent Outpatient Visits           5 days ago 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, unspecified laterality   [place] [facility] [place], Salvadore Oxford, NP   1 month ago Encounter for general adult medical  examination with abnormal findings   [place] [facility] [place], Salvadore Oxford, NP   5 months ago Orthostatic hypotension   [place] [facility] [place], Salvadore Oxford, NP   7 months ago CHF (congestive heart failure), NYHA class II, chronic, diastolic [facility])   [place] [facility] [place], Salvadore Oxford, NP   1 year ago Encounter for general adult medical examination with abnormal findings   [place] [facility] [place], Salvadore Oxford, NP       Future Appointments             In 4 months Baity, Salvadore Oxford, NP [facility]

## 2022-06-20 ENCOUNTER — Encounter: Payer: Self-pay | Admitting: Internal Medicine

## 2022-06-20 ENCOUNTER — Ambulatory Visit (INDEPENDENT_AMBULATORY_CARE_PROVIDER_SITE_OTHER): Payer: Medicare Other | Admitting: Internal Medicine

## 2022-06-20 VITALS — BP 136/76 | HR 64 | Temp 96.6°F | Wt 239.0 lb

## 2022-06-20 DIAGNOSIS — R42 Dizziness and giddiness: Secondary | ICD-10-CM | POA: Diagnosis not present

## 2022-06-20 DIAGNOSIS — R519 Headache, unspecified: Secondary | ICD-10-CM

## 2022-06-20 DIAGNOSIS — H539 Unspecified visual disturbance: Secondary | ICD-10-CM

## 2022-06-20 NOTE — Patient Instructions (Signed)
Dizziness Dizziness is a common problem. It makes you feel unsteady or light-headed. You may feel like you are about to pass out (faint). Dizziness can lead to getting hurt if you stumble or fall. Dizziness can be caused by many things, including: Medicines. Not having enough water in your body (dehydration). Illness. Follow these instructions at home: Eating and drinking  Drink enough fluid to keep your pee (urine) pale yellow. This helps to keep you from getting dehydrated. Try to drink more clear fluids, such as water. Do not drink alcohol. Limit how much caffeine you drink or eat, if your doctor tells you to do that. Limit how much salt (sodium) you drink or eat, if your doctor tells you to do that. Activity  Avoid making quick movements. Stand up slowly from sitting in a chair, and steady yourself until you feel okay. In the morning, first sit up on the side of the bed. When you feel okay, stand up slowly while you hold onto something. Do this until you know that your balance is okay. If you need to stand in one place for a long time, move your legs often. Tighten and relax the muscles in your legs while you are standing. Do not drive or use machinery if you feel dizzy. Avoid bending down if you feel dizzy. Place items in your home so you can reach them easily without leaning over. Lifestyle Do not smoke or use any products that contain nicotine or tobacco. If you need help quitting, ask your doctor. Try to lower your stress level. You can do this by using methods such as yoga or meditation. Talk with your doctor if you need help. General instructions Watch your dizziness for any changes. Take over-the-counter and prescription medicines only as told by your doctor. Talk with your doctor if you think that you are dizzy because of a medicine that you are taking. Tell a friend or a family member that you are feeling dizzy. If he or she notices any changes in your behavior, have this  person call your doctor. Keep all follow-up visits. Contact a doctor if: Your dizziness does not go away. Your dizziness or light-headedness gets worse. You feel like you may vomit (are nauseous). You have trouble hearing. You have new symptoms. You are unsteady on your feet. You feel like the room is spinning. You have neck pain or a stiff neck. You have a fever. Get help right away if: You vomit or have watery poop (diarrhea), and you cannot eat or drink anything. You have trouble: Talking. Walking. Swallowing. Using your arms, hands, or legs. You feel generally weak. You are not thinking clearly, or you have trouble forming sentences. A friend or family member may notice this. You have: Chest pain. Pain in your belly (abdomen). Shortness of breath. Sweating. Your vision changes. You are bleeding. You have a very bad headache. These symptoms may be an emergency. Get help right away. Call your local emergency services (911 in the [place] Do not wait to see if the symptoms will go away. Do not drive yourself to the hospital. Summary Dizziness makes you feel unsteady or light-headed. You may feel like you are about to pass out (faint). Drink enough fluid to keep your pee (urine) pale yellow. Do not drink alcohol. Avoid making quick movements if you feel dizzy. Watch your dizziness for any changes. This information is not intended to replace advice given to you by your health care provider. Make sure you discuss any questions  you have with your health care provider. Document Revised: 12/10/2019 Document Reviewed: 12/13/2019 Elsevier Patient Education  2024 [employer].

## 2022-06-20 NOTE — Progress Notes (Signed)
Subjective:    Patient ID: Richard [name], male    DOB: 09-22-1960, 62 y.o.   MRN: [id number]  HPI  Pt presents to the clinic today with c/o lightheadedness, headache, and blurred vision. This started 2 days ago.  He reports this has occurred 3 x in the last 2 days.  He reports the symptoms will last for about 20 minutes before resolving.  He denies near syncope, confusion, difficulty with speech, difficulty with swallowing, weakness on one side of his body, chest pain, or shortness of breath.  He finished Prednisone yesterday morning for recent gout attack but denies changes in diet or medications otherwise.  He has not had his eyes checked in over a year.  Review of Systems     Past Medical History:  Diagnosis Date   Arthritis    Arthrofibrosis of total knee arthroplasty (HCC) 05/30/2015   Bilateral pulmonary embolism (HCC)    a. 03/2015 CTA: acute bilat PE; b. IVC filter placed in 2017--> removed 2018.   Chest pain    a. 2015 Abnl MV;  b. 2015 Cath: nl cors.   Chronic diastolic CHF (congestive heart failure) (HCC)    a. 03/2015 Echo: EF 55-65%, poor windows, mildly dil RV.   DVT (deep venous thrombosis) (HCC)    a. 03/2015 U/S: occlusive DVT w/in the distal aspect of the Left fem vein through the L popliteal vein.   Dyspnea    On exertion   GERD (gastroesophageal reflux disease)    Headache    Hypertension    Hypertensive heart disease    Obesity    OSA (obstructive sleep apnea)    uses CPAP nightly settings at 15    PAF (paroxysmal atrial fibrillation) (HCC)    Pneumonia    hx of years ago    Primary localized osteoarthritis of left knee    a. 11/2014 s/p L TKA.   Primary localized osteoarthritis of right knee    a. 02/2014 s/p R Partial Knee arthroplasty.   Stiffness of left knee    a. 12/2014 s/p manipulation under anesthesia.   Urine incontinence     Current Outpatient Medications  Medication Sig Dispense Refill   allopurinol (ZYLOPRIM) 100 MG tablet Take 1 tablet  (100 mg total) by mouth daily. 90 tablet 0   baclofen (LIORESAL) 10 MG tablet TAKE 1 TO 2 TABLETS BY MOUTH 3 TIMES DAILY AS NEEDED FOR MUSCLE SPASMS 180 each 0   colchicine 0.6 MG tablet TAKE 1 TABLET BY MOUTH TWICE A DAY 180 tablet 0   ELIQUIS 5 MG TABS tablet TAKE ONE TABLET BY MOUTH TWO TIMES DAILY 180 tablet 1   flecainide (TAMBOCOR) 100 MG tablet Take (1) one tablet by mouth (2) twice daily 180 tablet 2   furosemide (LASIX) 20 MG tablet Take 1 tablet (20 mg total) by mouth daily as needed. 15 tablet 0   Multiple Vitamins-Minerals (MULTIVITAMIN WITH MINERALS) tablet Take 1 tablet by mouth daily.     pantoprazole (PROTONIX) 40 MG tablet TAKE 1 TABLET BY MOUTH ONCE A DAY 90 tablet 1   predniSONE (DELTASONE) 10 MG tablet Take 6 tabs on day 1, 5 tabs on day 2, 4 tabs on day 3, 3 tabs on day 4, 2 tabs on day 5, 1 tab on day 6 21 tablet 0   Sennosides-Docusate Sodium (SM STOOL SOFTENER/LAXATIVE PO) Take 8.6 mg by mouth daily.     traMADol (ULTRAM) 50 MG tablet TAKE ONE TABLET (50 MG TOTAL) BY  MOUTH DAILY AS NEEDED. 30 tablet 0   No current facility-administered medications for this visit.    Allergies  Allergen Reactions   Penicillins Anaphylaxis and Other (See Comments)    From childhood; uncertain of reaction Has patient had a PCN reaction causing immediate rash, facial/tongue/throat swelling, SOB or lightheadedness with hypotension: Unk Has patient had a PCN reaction causing severe rash involving mucus membranes or skin necrosis: Unk Has patient had a PCN reaction that required hospitalization: Unk Has patient had a PCN reaction occurring within the last 10 years: No If all of the above answers are "NO", then may proceed with Cephalosporin use.   Vancomycin Other (See Comments)    Red Mans' syndrome   Penicillins Other (See Comments)    Childhood   Vancomycin Other (See Comments)    Red man Syndrome    Family History  Problem Relation Age of Onset   Coronary artery disease Mother     Hyperlipidemia Mother    Hypertension Mother    Arthritis Mother    Coronary artery disease Father    Heart disease Paternal Grandmother    Alzheimer's disease Paternal Grandfather     Social History   Socioeconomic History   Marital status: Married    Spouse name: Jaysun Libby   Number of children: 2   Years of education: 12   Highest education level: Not on file  Occupational History   Occupation: Acupuncturist: Engineer, civil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) [employer]  Tobacco Use   Smoking status: Never   Smokeless tobacco: Never  Vaping Use   Vaping Use: Never used  Substance and Sexual Activity   Alcohol use: Not Currently    Comment: \   Drug use: No   Sexual activity: Yes  Other Topics Concern   Not on file  Social History Narrative   ** Merged History Encounter **       Social Determinants of Health   Financial Resource Strain: Low Risk  (05/07/2021)   Overall Financial Resource Strain (CARDIA)    Difficulty of Paying Living Expenses: Not hard at all  Food Insecurity: No Food Insecurity (05/07/2021)   Hunger Vital Sign    Worried About Running Out of Food in the Last Year: Never true    Ran Out of Food in the Last Year: Never true  Transportation Needs: No Transportation Needs (05/07/2021)   PRAPARE - Administrator, Civil Service (Medical): No    Lack of Transportation (Non-Medical): No  Physical Activity: Not on file  Stress: No Stress Concern Present (05/07/2021)   [employer] of Occupational Health - Occupational Stress Questionnaire    Feeling of Stress : Not at all  Social Connections: Socially Integrated (05/07/2021)   Social Connection and Isolation Panel [NHANES]    Frequency of Communication with Friends and Family: More than three times a week    Frequency of Social Gatherings with Friends and Family: More than three times a week    Attends Religious Services: More than 4 times per year    Active Member of [employer] or Organizations: Yes    Attends Hospital doctor: More than 4 times per year    Marital Status: Married  Catering manager Violence: Not on file     Constitutional: Patient reports headache.  Denies fever, malaise, fatigue, or abrupt weight changes.  HEENT: Pt reports blurred vision. Denies eye pain, eye redness, ear pain, ringing in the ears, wax buildup, runny nose, nasal congestion, bloody nose, or  sore throat. Respiratory: Denies difficulty breathing, shortness of breath, cough or sputum production.   Cardiovascular: Denies chest pain, chest tightness, palpitations or swelling in the hands or feet.  Gastrointestinal: Denies abdominal pain, bloating, constipation, diarrhea or blood in the stool.  GU: Denies urgency, frequency, pain with urination, burning sensation, blood in urine, odor or discharge. Musculoskeletal: Pt reports joint pain. Denies decrease in range of motion, difficulty with gait, muscle pain or joint swelling.  Skin: Denies redness, rashes, lesions or ulcercations.  Neurological: Pt reports dizziness. Denies difficulty with memory, difficulty with speech or problems with balance and coordination.  Psych: Denies anxiety, depression, SI/HI.  No other specific complaints in a complete review of systems (except as listed in HPI above).  Objective:   Physical Exam  BP 136/76 (BP Location: Left Arm, Patient Position: Sitting, Cuff Size: Large)   Pulse 64   Temp (!) 96.6 F (35.9 C) (Temporal)   Wt 239 lb (108.4 kg)   SpO2 96%   BMI 35.29 kg/m   Wt Readings from Last 3 Encounters:  06/14/22 225 lb (102.1 kg)  05/09/22 236 lb 6.4 oz (107.2 kg)  01/31/22 230 lb (104.3 kg)    General: Appears his stated age, obese, in NAD. Skin: Warm, dry and intact.  HEENT: Head: normal shape and size; Eyes: sclera white, no icterus, conjunctiva pink, PERRLA and EOMs intact; Ears: Tm's gray and intact, normal light reflex;  Cardiovascular: Normal rate and rhythm. S1,S2 noted.  No murmur, rubs or gallops noted. No JVD or  BLE edema. No carotid bruits noted. Pulmonary/Chest: Normal effort and positive vesicular breath sounds. No respiratory distress. No wheezes, rales or ronchi noted.  Musculoskeletal:  No difficulty with gait.  Neurological: Alert and oriented. Coordination normal.  Negative Romberg.    BMET    Component Value Date/Time   NA 141 05/09/2022 0955   NA 145 (H) 02/05/2021 0909   K 4.0 05/09/2022 0955   CL 106 05/09/2022 0955   CO2 27 05/09/2022 0955   GLUCOSE 83 05/09/2022 0955   BUN 18 05/09/2022 0955   BUN 16 02/05/2021 0909   CREATININE 1.30 05/09/2022 0955   CALCIUM 9.6 05/09/2022 0955   GFRNONAA >60 12/26/2019 0123   GFRAA >60 10/19/2019 1337   GFRAA >60 10/19/2019 1337    Lipid Panel     Component Value Date/Time   CHOL 162 05/09/2022 0955   CHOL 143 02/05/2021 0909   TRIG 82 05/09/2022 0955   HDL 47 05/09/2022 0955   HDL 48 02/05/2021 0909   CHOLHDL 3.4 05/09/2022 0955   VLDL 22.4 01/17/2020 1214   LDLCALC 98 05/09/2022 0955    CBC    Component Value Date/Time   WBC 6.0 05/09/2022 0955   RBC 5.10 05/09/2022 0955   HGB 15.8 05/09/2022 0955   HGB 16.2 02/05/2021 0909   HCT 46.2 05/09/2022 0955   HCT 45.5 02/05/2021 0909   PLT 182 05/09/2022 0955   PLT 195 02/05/2021 0909   MCV 90.6 05/09/2022 0955   MCV 88 02/05/2021 0909   MCH 31.0 05/09/2022 0955   MCHC 34.2 05/09/2022 0955   RDW 12.5 05/09/2022 0955   RDW 12.6 02/05/2021 0909   LYMPHSABS 1.2 10/27/2019 1133   LYMPHSABS 1.6 10/31/2017 1438   MONOABS 1.0 10/27/2019 1133   EOSABS 0.0 10/27/2019 1133   EOSABS 0.2 10/31/2017 1438   BASOSABS 0.0 10/27/2019 1133   BASOSABS 0.0 10/31/2017 1438    Hgb A1C Lab Results  Component Value Date  HGBA1C 5.2 05/09/2022          Assessment & Plan:   Acute Headache, Lightheadedness, Blurred Vision:  CBG today 110 Discussed that he could be having TIAs Will obtain MRI brain for further evaluation of symptoms Advised him if symptoms recur that I would  go to the ER immediately for further evaluation  RTC in 5 months for follow up of chronic conditions Nicki Reaper, NP

## 2022-06-27 ENCOUNTER — Ambulatory Visit
Admission: RE | Admit: 2022-06-27 | Discharge: 2022-06-27 | Disposition: A | Discharge status: Home / Self Care | Payer: Medicare Other | Source: Ambulatory Visit | Attending: Internal Medicine | Admitting: Internal Medicine

## 2022-06-27 DIAGNOSIS — R42 Dizziness and giddiness: Secondary | ICD-10-CM | POA: Insufficient documentation

## 2022-06-27 DIAGNOSIS — R519 Headache, unspecified: Secondary | ICD-10-CM | POA: Diagnosis not present

## 2022-06-27 DIAGNOSIS — H539 Unspecified visual disturbance: Secondary | ICD-10-CM | POA: Diagnosis not present

## 2022-06-27 DIAGNOSIS — R5383 Other fatigue: Secondary | ICD-10-CM | POA: Diagnosis not present

## 2022-06-27 MED ORDER — GADOBUTROL 1 MMOL/ML IV SOLN
10.0000 mL | Freq: Once | INTRAVENOUS | Status: AC | PRN
  Administered 2022-06-27: 10 mL via INTRAVENOUS

## 2022-07-01 ENCOUNTER — Ambulatory Visit: Payer: Self-pay | Admitting: *Deleted

## 2022-07-01 NOTE — Telephone Encounter (Signed)
Informational call- patient wants permission to go about ADL- driving Chief Complaint: headache still present- patient denies vision changes  Symptoms: mild- on/off headache- awaiting imaging results Frequency: ongoing Pertinent Negatives: Patient denies fever, stiff neck, eye pain, sore throat, cold symptoms Disposition: [] ED /[] Urgent Care (no appt availability in office) / [] Appointment(In office/virtual)/ []  [place] [facility]/ [] Home Care/ [] Refused Recommended Disposition /[] [place] [facility]/ [x]  Follow-up with PCP Additional Notes: Patient called for imaging results- he has been avoiding driving while awaiting results- he would like to know if he is ok to return to normal activity. Please contact patient with PCP recommendations

## 2022-07-01 NOTE — Telephone Encounter (Signed)
It has not resulted yet. They should have it read within a week and I can let him know the results when they come through.

## 2022-07-01 NOTE — Telephone Encounter (Signed)
  Chief Complaint: requesting call back regarding returning to ADLs / driving. See previous request Symptoms: na . See previous encounter today  Frequency: na Pertinent Negatives: Patient denies na Disposition: [] ED /[] Urgent Care (no appt availability in office) / [] Appointment(In office/virtual)/ []  [place] [facility]/ [] Home Care/ [] Refused Recommended Disposition /[] [place] [facility]/ [x]  Follow-up with PCP Additional Notes:   See previous encounter today regarding call back from PCP and returning to ADLs and driving. See NT from 06/28/22. Recommended patient to allow PCP time to respond and he would receive a call back regarding recommendations from PCP. Please advise.    Reason for Disposition  [1] Follow-up call to recent contact AND [2] information only call, no triage required  Answer Assessment - Initial Assessment Questions 1. REASON FOR CALL or QUESTION: "What is your reason for calling today?" or "How can I best help you?" or "What question do you have that I can help answer?"     Requesting call back from PCP regarding returning to ADLs and driving. See previous encounter today for same request.  Protocols used: Information Only Call - No Triage-A-AH

## 2022-07-01 NOTE — Telephone Encounter (Signed)
Pt advised.   Thanks,   -Hanan Moen  

## 2022-07-01 NOTE — Telephone Encounter (Signed)
Reason for Disposition  [1] MILD-MODERATE headache AND [2] present > 72 hours    Patient was seen last week for this symptom- information collected for status of patient.  Answer Assessment - Initial Assessment Questions 1. LOCATION: "Where does it hurt?"      Behind the eye 2. ONSET: "When did the headache start?" (Minutes, hours or days)      Since spells- light headache- once or twice day-pressure 3. PATTERN: "Does the pain come and go, or has it been constant since it started?"     Comes and goes 4. SEVERITY: "How bad is the pain?" and "What does it keep you from doing?"  (e.g., Scale 1-10; mild, moderate, or severe)   - MILD (1-3): doesn't interfere with normal activities    - MODERATE (4-7): interferes with normal activities or awakens from sleep    - SEVERE (8-10): excruciating pain, unable to do any normal activities        mild 5. RECURRENT SYMPTOM: "Have you ever had headaches before?" If Yes, ask: "When was the last time?" and "What happened that time?"      Hx migraines- blood thinners seemed to make them go away 6. CAUSE: "What do you think is causing the headache?"     unsure 7. MIGRAINE: "Have you been diagnosed with migraine headaches?" If Yes, ask: "Is this headache similar?"      Hx migraines 8. HEAD INJURY: "Has there been any recent injury to the head?"      no 9. OTHER SYMPTOMS: "Do you have any other symptoms?" (fever, stiff neck, eye pain, sore throat, cold symptoms)     no  Protocols used: Headache-A-AH

## 2022-07-29 ENCOUNTER — Ambulatory Visit (INDEPENDENT_AMBULATORY_CARE_PROVIDER_SITE_OTHER): Payer: Medicare Other | Admitting: Internal Medicine

## 2022-07-29 VITALS — BP 139/89 | HR 69 | Resp 18 | Ht 69.0 in | Wt 243.0 lb

## 2022-07-29 DIAGNOSIS — G4733 Obstructive sleep apnea (adult) (pediatric): Secondary | ICD-10-CM

## 2022-07-29 DIAGNOSIS — Z7189 Other specified counseling: Secondary | ICD-10-CM | POA: Diagnosis not present

## 2022-07-29 DIAGNOSIS — E669 Obesity, unspecified: Secondary | ICD-10-CM | POA: Diagnosis not present

## 2022-07-29 DIAGNOSIS — I480 Paroxysmal atrial fibrillation: Secondary | ICD-10-CM

## 2022-07-29 NOTE — Patient Instructions (Signed)

## 2022-07-29 NOTE — Progress Notes (Signed)
[facility] [address] [place], [place] [address]  Pulmonary Sleep Medicine   Office Visit Note  Patient Name: Richard [name] DOB: 08/19/1960 MRN [id number]    Chief Complaint: Obstructive Sleep Apnea visit  Brief History:  Richard [name] is seen today for an annual follow up on CPAP at 17 cmh20.  The patient has a 10 year history of sleep apnea. Patient is using PAP nightly.  The patient feels not as rested after sleeping with PAP.  The patient reports benefiting from PAP use. Reported sleepiness is  improved and the Epworth Sleepiness Score is 18 out of 24. The patient will occasionally take naps. The patient complains of the following: pt is in need of supplies as his current supplies are worn out.  The compliance download shows 76% compliance with an average use time of 5:16 hours. The AHI is 2.2.  The patient does not complain of limb movements disrupting sleep. The patient continues to require PAP therapy in order to eliminate sleep apnea.  ROS  General: (-) fever, (-) chills, (-) night sweat Nose and Sinuses: (-) nasal stuffiness or itchiness, (-) postnasal drip, (-) nosebleeds, (-) sinus trouble. Mouth and Throat: (-) sore throat, (-) hoarseness. Neck: (-) swollen glands, (-) enlarged thyroid, (-) neck pain. Respiratory: - cough, - shortness of breath, - wheezing. Neurologic: - numbness, - tingling. Psychiatric: - anxiety, - depression   Current Medication: Outpatient Encounter Medications as of 07/29/2022  Medication Sig   allopurinol (ZYLOPRIM) 100 MG tablet Take 1 tablet (100 mg total) by mouth daily.   baclofen (LIORESAL) 10 MG tablet TAKE 1 TO 2 TABLETS BY MOUTH 3 TIMES DAILY AS NEEDED FOR MUSCLE SPASMS   colchicine 0.6 MG tablet TAKE 1 TABLET BY MOUTH TWICE A DAY   ELIQUIS 5 MG TABS tablet TAKE ONE TABLET BY MOUTH TWO TIMES DAILY   flecainide (TAMBOCOR) 100 MG tablet Take (1) one tablet by mouth (2) twice daily   furosemide (LASIX) 20 MG tablet Take 1  tablet (20 mg total) by mouth daily as needed.   Multiple Vitamins-Minerals (MULTIVITAMIN WITH MINERALS) tablet Take 1 tablet by mouth daily.   pantoprazole (PROTONIX) 40 MG tablet TAKE 1 TABLET BY MOUTH ONCE A DAY   predniSONE (DELTASONE) 10 MG tablet Take 6 tabs on day 1, 5 tabs on day 2, 4 tabs on day 3, 3 tabs on day 4, 2 tabs on day 5, 1 tab on day 6   Sennosides-Docusate Sodium (SM STOOL SOFTENER/LAXATIVE PO) Take 8.6 mg by mouth daily.   traMADol (ULTRAM) 50 MG tablet TAKE ONE TABLET (50 MG TOTAL) BY MOUTH DAILY AS NEEDED.   No facility-administered encounter medications on file as of 07/29/2022.    Surgical History: Past Surgical History:  Procedure Laterality Date   APPENDECTOMY     CARDIAC CATHETERIZATION     CARPAL TUNNEL RELEASE Left 03/2018   CYSTOSCOPY W/ RETROGRADES  12/09/2019   Procedure: CYSTOSCOPY WITH RETROGRADE PYELOGRAM;  Surgeon: Orson Ape, MD;  Location: [facility] ORS;  Service: Urology;;   CYSTOSCOPY WITH STENT PLACEMENT Right 12/09/2019   Procedure: CYSTOSCOPY WITH STENT PLACEMENT;  Surgeon: Orson Ape, MD;  Location: [facility];  Service: Urology;  Laterality: Right;   EXAM UNDER ANESTHESIA WITH MANIPULATION OF KNEE Left 05/30/2015   Procedure: EXAM UNDER ANESTHESIA WITH MANIPULATION OF LEFT KNEE;  Surgeon: Teryl Lucy, MD;  [facility];  Service: Orthopedics;  Laterality: Left;   HIATAL HERNIA REPAIR N/A 10/26/2019   Procedure: HIATAL HERNIA  REPAIR;  Surgeon: Luretha Murphy, MD;  Location: [facility];  Service: General;  Laterality: N/A;   I & D KNEE WITH POLY EXCHANGE Left 10/30/2015   Procedure: IRRIGATION AND DEBRIDEMENT KNEE WITH POLY EXCHANGE PLACE SPACERS;  Surgeon: Gean Birchwood, MD;  Location: [facility];  Service: Orthopedics;  Laterality: Left;  OFF ELIQUIST 3 DAYS   IVC FILTER PLACEMENT ([facility] HX)  04/13/15   IVC FILTER REMOVAL N/A 08/13/2016   Procedure: IVC Filter Removal;  Surgeon: Renford Dills, MD;  Location: [facility]AS[facility];  Service:  Cardiovascular;  Laterality: N/A;   JOINT REPLACEMENT     KNEE ARTHROSCOPY Left 08/08/2015   Procedure: LEFT KNEE MANIPULATION WITH ARTHROSCOPIC LYSIS OF ADHESIONS AND ASPIRATION;  Surgeon: Teryl Lucy, MD;  Location: [facility];  Service: Orthopedics;  Laterality: Left;   KNEE CLOSED REDUCTION Left 01/20/2015   Procedure: LEFT KNEE MANIPULATION;  Surgeon: Teryl Lucy, MD;  Location: [facility];  Service: Orthopedics;  Laterality: Left;   LAPAROSCOPIC GASTRIC SLEEVE RESECTION N/A 10/26/2019   Procedure: LAPAROSCOPIC SLEEVE GASTRECTOMY;  Surgeon: Luretha Murphy, MD;  Location: [facility];  Service: General;  Laterality: N/A;   LEFT HEART CATHETERIZATION WITH CORONARY ANGIOGRAM N/A 01/12/2014   Procedure: LEFT HEART CATHETERIZATION WITH CORONARY ANGIOGRAM;  Surgeon: Corky Crafts, MD;  Location: [facility] CATH LAB;  Service: Cardiovascular;  Laterality: N/A;   ORTHOPEDIC SURGERY Left    arthroscopy x3   PARTIAL KNEE ARTHROPLASTY Right 02/25/2014   Procedure: RIGHT KNEE ARTHROPLASTY CONDYLE AND PLATEAU MEDIAL COMPARTMENT ;  Surgeon: Eulas Post, MD;  Location: [facility];  Service: Orthopedics;  Laterality: Right;   PERIPHERAL VASCULAR CATHETERIZATION N/A 03/29/2015   Procedure: IVC Filter Insertion, and possible thrombectomy;  Surgeon: Renford Dills, MD;  Location: [facility];  Service: Cardiovascular;  Laterality: N/A;   ROTATOR CUFF REPAIR Left    TOTAL KNEE ARTHROPLASTY  12/06/2014   Procedure: TOTAL KNEE ARTHROPLASTY;  Surgeon: Teryl Lucy, MD;  Location[facility];  Service: Orthopedics;;   TOTAL KNEE REVISION Left 02/08/2016   Procedure: TOTAL KNEE REVISION;  Surgeon: Gean Birchwood, MD;  Location: [facility];  Service: Orthopedics;  Laterality: Left;   UPPER GI ENDOSCOPY N/A 10/26/2019   Procedure: UPPER GI ENDOSCOPY;  Surgeon: Luretha Murphy, MD;  Location: [facility];  Service: General;  Laterality: N/A;   URETEROSCOPY WITH HOLMIUM LASER LITHOTRIPSY Right 12/30/2019    Procedure: URETEROSCOPY WITH HOLMIUM LASER LITHOTRIPSY;  Surgeon: Orson Ape, MD;  Location: [facility];  Service: Urology;  Laterality: Right;    Medical History: Past Medical History:  Diagnosis Date   Arthritis    Arthrofibrosis of total knee arthroplasty (HCC) 05/30/2015   Bilateral pulmonary embolism (HCC)    a. 03/2015 CTA: acute bilat PE; b. IVC filter placed in 2017--> removed 2018.   Chest pain    a. 2015 Abnl MV;  b. 2015 Cath: nl cors.   Chronic diastolic CHF (congestive heart failure) (HCC)    a. 03/2015 Echo: EF 55-65%, poor windows, mildly dil RV.   DVT (deep venous thrombosis) (HCC)    a. 03/2015 U/S: occlusive DVT w/in the distal aspect of the Left fem vein through the L popliteal vein.   Dyspnea    On exertion   GERD (gastroesophageal reflux disease)    Headache    Hypertension    Hypertensive heart disease    Obesity    OSA (obstructive sleep apnea)    uses CPAP nightly settings at  15    PAF (paroxysmal atrial fibrillation) (HCC)    Pneumonia    hx of years ago    Primary localized osteoarthritis of left knee    a. 11/2014 s/p L TKA.   Primary localized osteoarthritis of right knee    a. 02/2014 s/p R Partial Knee arthroplasty.   Stiffness of left knee    a. 12/2014 s/p manipulation under anesthesia.   Urine incontinence     Family History: Non contributory to the present illness  Social History: Social History   Socioeconomic History   Marital status: Married    Spouse name: Adam Rentschler   Number of children: 2   Years of education: 12   Highest education level: Not on file  Occupational History   Occupation: Acupuncturist: Engineer, civil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) [employer]  Tobacco Use   Smoking status: Never   Smokeless tobacco: Never  Vaping Use   Vaping Use: Never used  Substance and Sexual Activity   Alcohol use: Not Currently    Comment: \   Drug use: No   Sexual activity: Yes  Other Topics Concern   Not on file  Social History Narrative   ** Merged History  Encounter **       Social Determinants of Health   Financial Resource Strain: Low Risk  (05/07/2021)   Overall Financial Resource Strain (CARDIA)    Difficulty of Paying Living Expenses: Not hard at all  Food Insecurity: No Food Insecurity (05/07/2021)   Hunger Vital Sign    Worried About Running Out of Food in the Last Year: Never true    Ran Out of Food in the Last Year: Never true  Transportation Needs: No Transportation Needs (05/07/2021)   PRAPARE - Administrator, Civil Service (Medical): No    Lack of Transportation (Non-Medical): No  Physical Activity: Not on file  Stress: No Stress Concern Present (05/07/2021)   [employer] of Occupational Health - Occupational Stress Questionnaire    Feeling of Stress : Not at all  Social Connections: Socially Integrated (05/07/2021)   Social Connection and Isolation Panel [NHANES]    Frequency of Communication with Friends and Family: More than three times a week    Frequency of Social Gatherings with Friends and Family: More than three times a week    Attends Religious Services: More than 4 times per year    Active Member of [employer] or Organizations: Yes    Attends Engineer, structural: More than 4 times per year    Marital Status: Married  Catering manager Violence: Not on file    Vital Signs: Blood pressure 139/89, pulse 69, resp. rate 18, height 5\' 9"  (1.753 m), weight 243 lb (110.2 kg), SpO2 96 %. Body mass index is 35.88 kg/m.    Examination: General Appearance: The patient is well-developed, well-nourished, and in no distress. Neck Circumference: 43 cm Skin: Gross inspection of skin unremarkable. Head: normocephalic, no gross deformities. Eyes: no gross deformities noted. ENT: ears appear grossly normal Neurologic: Alert and oriented. No involuntary movements.  STOP BANG RISK ASSESSMENT S (snore) Have you been told that you snore?     NO   T (tired) Are you often tired, fatigued, or sleepy during  the day?   YES  O (obstruction) Do you stop breathing, choke, or gasp during sleep? NO   P (pressure) Do you have or are you being treated for high blood pressure? NO   B (BMI) Is your body index greater  than 35 kg/m? NO   A (age) Are you 19 years old or older? YES   N (neck) Do you have a neck circumference greater than 16 inches?   NO   G (gender) Are you a male? YES   TOTAL STOP/BANG "YES" ANSWERS 3       A STOP-Bang score of 2 or less is considered low risk, and a score of 5 or more is high risk for having either moderate or severe OSA. For people who score 3 or 4, doctors may need to perform further assessment to determine how likely they are to have OSA.         EPWORTH SLEEPINESS SCALE:  Scale:  (0)= no chance of dozing; (1)= slight chance of dozing; (2)= moderate chance of dozing; (3)= high chance of dozing  Chance  Situtation    Sitting and reading: 2    Watching TV: 3    Sitting Inactive in public: 2    As a passenger in car: 3      Lying down to rest: 3    Sitting and talking: 2    Sitting quielty after lunch: 3    In a car, stopped in traffic: 0   TOTAL SCORE:   18 out of 24    SLEEP STUDIES:  SPLIT (10/11/11) AHI 81, low SPO2 75%   CPAP COMPLIANCE DATA:  Date Range: 07/24/21 - 07/23/22  Average Daily Use: 5:16 hours  Median Use: 5:24 hours  Compliance for > 4 Hours: 278 days  AHI: 2.2 respiratory events per hour  Days Used: 363/365  Mask Leak: 34.2  95th Percentile Pressure: 17 cmh20         LABS: Recent Results (from the past 2160 hour(s))  CBC     Status: None   Collection Time: 05/09/22  9:55 AM  Result Value Ref Range   WBC 6.0 3.8 - 10.8 Thousand/uL   RBC 5.10 4.20 - 5.80 Million/uL   Hemoglobin 15.8 13.2 - 17.1 g/dL   HCT 16.1 09.6 - 04.5 %   MCV 90.6 80.0 - 100.0 fL   MCH 31.0 27.0 - 33.0 pg   MCHC 34.2 32.0 - 36.0 g/dL   RDW 40.9 81.1 - 91.4 %   Platelets 182 140 - 400 Thousand/uL   MPV 10.9 7.5 - 12.5 fL   COMPLETE METABOLIC PANEL WITH GFR     Status: None   Collection Time: 05/09/22  9:55 AM  Result Value Ref Range   Glucose, Bld 83 65 - 99 mg/dL    Comment: .            Fasting reference interval .    BUN 18 7 - 25 mg/dL   Creat 7.82 9.56 - 2.13 mg/dL   eGFR 63 > OR = 60 YQ/MVH/[id number]   BUN/Creatinine Ratio SEE NOTE: 6 - 22 (calc)    Comment:    Not Reported: BUN and Creatinine are within    reference range. .    Sodium 141 135 - 146 mmol/L   Potassium 4.0 3.5 - 5.3 mmol/L   Chloride 106 98 - 110 mmol/L   CO2 27 20 - 32 mmol/L   Calcium 9.6 8.6 - 10.3 mg/dL   Total Protein 6.7 6.1 - 8.1 g/dL   Albumin 4.3 3.6 - 5.1 g/dL   Globulin 2.4 1.9 - 3.7 g/dL (calc)   AG Ratio 1.8 1.0 - 2.5 (calc)   Total Bilirubin 0.6 0.2 - 1.2 mg/dL   Alkaline phosphatase (  APISO) 72 35 - 144 U/L   AST 20 10 - 35 U/L   ALT 20 9 - 46 U/L  Lipid panel     Status: None   Collection Time: 05/09/22  9:55 AM  Result Value Ref Range   Cholesterol 162 <200 mg/dL   HDL 47 > OR = 40 mg/dL   Triglycerides 82 <409 mg/dL   LDL Cholesterol (Calc) 98 mg/dL (calc)    Comment: Reference range: <100 . Desirable range <100 mg/dL for primary prevention;   <70 mg/dL for patients with CHD or diabetic patients  with > or = 2 CHD risk factors. Marland Kitchen LDL-C is now calculated using the Martin-Hopkins  calculation, which is a validated novel method providing  better accuracy than the Friedewald equation in the  estimation of LDL-C.  Horald Pollen et al. Lenox Ahr. 8119;147(82): 2061-2068  (http://education.[facility].com/faq/FAQ164)    Total CHOL/HDL Ratio 3.4 <5.0 (calc)   Non-HDL Cholesterol (Calc) 115 <130 mg/dL (calc)    Comment: For patients with diabetes plus 1 major ASCVD risk  factor, treating to a non-HDL-C goal of <100 mg/dL  (LDL-C of <95 mg/dL) is considered a therapeutic  option.   Hemoglobin A1c     Status: None   Collection Time: 05/09/22  9:55 AM  Result Value Ref Range   Hgb A1c MFr Bld 5.2 <5.7 %  of total Hgb    Comment: For the purpose of screening for the presence of diabetes: . <5.7%       Consistent with the absence of diabetes 5.7-6.4%    Consistent with increased risk for diabetes             (prediabetes) > or =6.5%  Consistent with diabetes . This assay result is consistent with a decreased risk of diabetes. . Currently, no consensus exists regarding use of hemoglobin A1c for diagnosis of diabetes in children. . According to American Diabetes Association (ADA) guidelines, hemoglobin A1c <7.0% represents optimal control in non-pregnant diabetic patients. Different metrics may apply to specific patient populations.  Standards of Medical Care in Diabetes(ADA). .    Mean Plasma Glucose 103 mg/dL   eAG (mmol/L) 5.7 mmol/L    Comment: . This test was performed on the Roche cobas c503 platform. Effective 10/29/21, a change in test platforms from the Abbott Architect to the Roche cobas c503 may have shifted HbA1c results compared to historical results. Based on laboratory validation testing conducted at [facility], the Roche platform relative to the Abbott platform had an average increase in HbA1c value of < or = 0.3%. This difference is within accepted  variability established by the [facility]. Note that not all individuals will have had a shift in their results and direct comparisons between historical and current results for testing conducted on different platforms is not recommended.   PSA     Status: None   Collection Time: 05/09/22  9:55 AM  Result Value Ref Range   PSA 0.27 < OR = 4.00 ng/mL    Comment: The total PSA value from this assay system is  standardized against the WHO standard. The test  result will be approximately 20% lower when compared  to the equimolar-standardized total PSA (Beckman  Coulter). Comparison of serial PSA results should be  interpreted with this fact in mind. . This test was performed using  the Siemens  chemiluminescent method. Values obtained from  different assay methods cannot be used interchangeably. PSA levels, regardless of value, should not be interpreted as absolute  evidence of the presence or absence of disease.   Uric acid     Status: Abnormal   Collection Time: 05/09/22  9:55 AM  Result Value Ref Range   Uric Acid, Serum 8.5 (H) 4.0 - 8.0 mg/dL    Comment: Therapeutic target for gout patients: <6.0 mg/dL .   Vitamin B12     Status: None   Collection Time: 05/09/22  9:55 AM  Result Value Ref Range   Vitamin B-12 440 200 - 1,100 pg/mL    Radiology: MR Brain W Wo Contrast  Result Date: 07/08/2022 CLINICAL DATA:  Episodes of vision loss, headaches, fatigue EXAM: MRI HEAD WITHOUT AND WITH CONTRAST TECHNIQUE: Multiplanar, multiecho pulse sequences of the brain and surrounding structures were obtained without and with intravenous contrast. CONTRAST:  10mL GADAVIST GADOBUTROL 1 MMOL/ML IV SOLN COMPARISON:  No prior MRI head available, correlation is made with CT head 02/09/2020 FINDINGS: Brain: No restricted diffusion to suggest acute or subacute infarct. No abnormal parenchymal or meningeal enhancement. No acute hemorrhage, mass, mass effect, or midline shift. No hydrocephalus or extra-axial collection. Normal pituitary and craniocervical junction. No hemosiderin deposition to suggest remote hemorrhage. Normal cerebral volume for age. No evidence of remote cortical or lacunar infarct. Vascular: Normal arterial flow voids. Normal arterial and venous enhancement. Skull and upper cervical spine: Normal marrow signal. Sinuses/Orbits: Mucosal thickening in the right maxillary sinus, right sphenoid sinus, and right ethmoid air cells. No acute finding in the orbits. Other: The mastoid air cells are well aerated. IMPRESSION: No acute intracranial process. No etiology is seen for the patient's symptoms. Electronically Signed   By: Wiliam Ke M.D.   On: 07/08/2022 03:57    No  results found.  No results found.    Assessment and Plan: Patient Active Problem List   Diagnosis Date Noted   History of DVT (deep vein thrombosis) 11/09/2021   Gout 11/09/2021   Class 1 obesity due to excess calories with body mass index (BMI) of 34.0 to 34.9 in adult 05/07/2021   OA (osteoarthritis) 06/23/2018   History of pulmonary embolus (PE) 06/23/2018   CHF (congestive heart failure), NYHA class II, chronic, diastolic (HCC) 06/23/2018   PAF (paroxysmal atrial fibrillation) (HCC) 03/30/2015   Hypertension 08/17/2011   GERD (gastroesophageal reflux disease) 08/17/2011   OSA (obstructive sleep apnea) 08/16/2011   1. OSA (obstructive sleep apnea) The patient does tolerate PAP and reports  benefit from PAP use. HE has been frustrated by leak caused by outdated supplies. We will get the patient back on mail outs. We will do a download in 3 weeks, if leak persists he will need a mask fitting. The patient was reminded how to clean equipment and advised to replace supplies routinely. The patient was also counselled on weight loss. The compliance is fair. The AHI is 2.2.   OSA on cpap- controlled. Increase compliance with pap. Replace supplies. 3 wk download. CPAP continues to be medically necessary to treat this patient's OSA. F/u one year.     2. CPAP use counseling CPAP Counseling: had a lengthy discussion with the patient regarding the importance of PAP therapy in management of the sleep apnea. Patient appears to understand the risk factor reduction and also understands the risks associated with untreated sleep apnea. Patient will try to make a good faith effort to remain compliant with therapy. Also instructed the patient on proper cleaning of the device including the water must be changed daily if possible and use of distilled water is preferred.  Patient understands that the machine should be regularly cleaned with appropriate recommended cleaning solutions that do not damage the PAP  machine for example given white vinegar and water rinses. Other methods such as ozone treatment may not be as good as these simple methods to achieve cleaning.   3. PAF (paroxysmal atrial fibrillation) (HCC) On eliquis and fleicanide, stable.   4. Obesity (BMI 30.0-34.9) Obesity Counseling: Had a lengthy discussion regarding patients BMI and weight issues. Patient was instructed on portion control as well as increased activity. Also discussed caloric restrictions with trying to maintain intake less than 2000 Kcal. Discussions were made in accordance with the 5As of weight management. Simple actions such as not eating late and if able to, taking a walk is suggested.      General Counseling: I have discussed the findings of the evaluation and examination with Richard [name].  I have also discussed any further diagnostic evaluation thatmay be needed or ordered today. Richard [name] verbalizes understanding of the findings of todays visit. We also reviewed his medications today and discussed drug interactions and side effects including but not limited excessive drowsiness and altered mental states. We also discussed that there is always a risk not just to him but also people around him. he has been encouraged to call the office with any questions or concerns that should arise related to todays visit.  No orders of the defined types were placed in this encounter.       I have personally obtained a history, examined the patient, evaluated laboratory and imaging results, formulated the assessment and plan and placed orders. This patient was seen today by Emmaline Kluver, PA-C in collaboration with Dr. Freda Munro.   Yevonne Pax, MD [facility] Diplomate ABMS Pulmonary Critical Care Medicine and Sleep Medicine

## 2022-08-06 DIAGNOSIS — G4733 Obstructive sleep apnea (adult) (pediatric): Secondary | ICD-10-CM | POA: Diagnosis not present

## 2022-08-08 ENCOUNTER — Ambulatory Visit (INDEPENDENT_AMBULATORY_CARE_PROVIDER_SITE_OTHER): Payer: Medicare Other

## 2022-08-08 VITALS — Ht 69.0 in | Wt 226.0 lb

## 2022-08-08 DIAGNOSIS — Z0000 Encounter for general adult medical examination without abnormal findings: Secondary | ICD-10-CM | POA: Diagnosis not present

## 2022-08-08 NOTE — Progress Notes (Signed)
Subjective:   Richard [name] is a 62 y.o. male who presents for Medicare Annual/Subsequent preventive examination.  Visit Complete: Virtual  I connected with  Maryann Conners on 08/08/22 by a audio enabled telemedicine application and verified that I am speaking with the correct person using two identifiers.  Patient Location: Home  Provider Location: Office/Clinic  I discussed the limitations of evaluation and management by telemedicine. The patient expressed understanding and agreed to proceed.  Per patient no change in vitals since last visit, unable to obtain new vitals due to telehealth visit     Review of Systems     Cardiac Risk Factors include: advanced age (>65men, >50 women);hypertension;male gender     Objective:    Today's Vitals   08/08/22 1232  Weight: 226 lb (102.5 kg)  Height: 5\' 9"  (1.753 m)   Body mass index is 33.37 kg/m.     08/08/2022   12:40 PM 12/30/2019   12:09 PM 10/26/2019   12:00 PM 10/26/2019    5:55 AM 10/19/2019    1:18 PM 09/20/2019   11:00 AM 11/26/2018    9:14 PM  Advanced Directives  Does Patient Have a Medical Advance Directive? No No No No No No No  Would patient like information on creating a medical advance directive?  No - Patient declined No - Patient declined No - Patient declined   No - Patient declined    Current Medications (verified) Outpatient Encounter Medications as of 08/08/2022  Medication Sig   allopurinol (ZYLOPRIM) 100 MG tablet Take 1 tablet (100 mg total) by mouth daily.   baclofen (LIORESAL) 10 MG tablet TAKE 1 TO 2 TABLETS BY MOUTH 3 TIMES DAILY AS NEEDED FOR MUSCLE SPASMS   colchicine 0.6 MG tablet TAKE 1 TABLET BY MOUTH TWICE A DAY   ELIQUIS 5 MG TABS tablet TAKE ONE TABLET BY MOUTH TWO TIMES DAILY   flecainide (TAMBOCOR) 100 MG tablet Take (1) one tablet by mouth (2) twice daily   furosemide (LASIX) 20 MG tablet Take 1 tablet (20 mg total) by mouth daily as needed.   Multiple Vitamins-Minerals  (MULTIVITAMIN WITH MINERALS) tablet Take 1 tablet by mouth daily.   pantoprazole (PROTONIX) 40 MG tablet TAKE 1 TABLET BY MOUTH ONCE A DAY   Sennosides-Docusate Sodium (SM STOOL SOFTENER/LAXATIVE PO) Take 8.6 mg by mouth daily.   traMADol (ULTRAM) 50 MG tablet TAKE ONE TABLET (50 MG TOTAL) BY MOUTH DAILY AS NEEDED.   predniSONE (DELTASONE) 10 MG tablet Take 6 tabs on day 1, 5 tabs on day 2, 4 tabs on day 3, 3 tabs on day 4, 2 tabs on day 5, 1 tab on day 6 (Patient not taking: Reported on 08/08/2022)   No facility-administered encounter medications on file as of 08/08/2022.    Allergies (verified) Penicillins, Vancomycin, Penicillins, and Vancomycin   History: Past Medical History:  Diagnosis Date   Arthritis    Arthrofibrosis of total knee arthroplasty (HCC) 05/30/2015   Bilateral pulmonary embolism (HCC)    a. 03/2015 CTA: acute bilat PE; b. IVC filter placed in 2017--> removed 2018.   Chest pain    a. 2015 Abnl MV;  b. 2015 Cath: nl cors.   Chronic diastolic CHF (congestive heart failure) (HCC)    a. 03/2015 Echo: EF 55-65%, poor windows, mildly dil RV.   DVT (deep venous thrombosis) (HCC)    a. 03/2015 U/S: occlusive DVT w/in the distal aspect of the Left fem vein through the L popliteal vein.  Dyspnea    On exertion   GERD (gastroesophageal reflux disease)    Headache    Hypertension    Hypertensive heart disease    Obesity    OSA (obstructive sleep apnea)    uses CPAP nightly settings at 15    PAF (paroxysmal atrial fibrillation) (HCC)    Pneumonia    hx of years ago    Primary localized osteoarthritis of left knee    a. 11/2014 s/p L TKA.   Primary localized osteoarthritis of right knee    a. 02/2014 s/p R Partial Knee arthroplasty.   Stiffness of left knee    a. 12/2014 s/p manipulation under anesthesia.   Urine incontinence    Past Surgical History:  Procedure Laterality Date   APPENDECTOMY     CARDIAC CATHETERIZATION     CARPAL TUNNEL RELEASE Left 03/2018    CYSTOSCOPY W/ RETROGRADES  12/09/2019   Procedure: CYSTOSCOPY WITH RETROGRADE PYELOGRAM;  Surgeon: Orson Ape, MD;  Location: [facility];  Service: Urology;;   CYSTOSCOPY WITH STENT PLACEMENT Right 12/09/2019   Procedure: CYSTOSCOPY WITH STENT PLACEMENT;  Surgeon: Orson Ape, MD;  Location: [facility];  Service: Urology;  Laterality: Right;   EXAM UNDER ANESTHESIA WITH MANIPULATION OF KNEE Left 05/30/2015   Procedure: EXAM UNDER ANESTHESIA WITH MANIPULATION OF LEFT KNEE;  Surgeon: Teryl Lucy, MD;  Location: [facility];  Service: Orthopedics;  Laterality: Left;   HIATAL HERNIA REPAIR N/A 10/26/2019   Procedure: HIATAL HERNIA REPAIR;  Surgeon: Luretha Murphy, MD;  Location: [facility];  Service: General;  Laterality: N/A;   I & D KNEE WITH POLY EXCHANGE Left 10/30/2015   Procedure: IRRIGATION AND DEBRIDEMENT KNEE WITH POLY EXCHANGE PLACE SPACERS;  Surgeon: Gean Birchwood, MD;  Location: [facility];  Service: Orthopedics;  Laterality: Left;  OFF ELIQUIST 3 DAYS   IVC FILTER PLACEMENT ([facility] HX)  04/13/15   IVC FILTER REMOVAL N/A 08/13/2016   Procedure: IVC Filter Removal;  Surgeon: Renford Dills, MD;  Location: [facility];  Service: Cardiovascular;  Laterality: N/A;   JOINT REPLACEMENT     KNEE ARTHROSCOPY Left 08/08/2015   Procedure: LEFT KNEE MANIPULATION WITH ARTHROSCOPIC LYSIS OF ADHESIONS AND ASPIRATION;  Surgeon: Teryl Lucy, MD;  Location: [facility];  Service: Orthopedics;  Laterality: Left;   KNEE CLOSED REDUCTION Left 01/20/2015   Procedure: LEFT KNEE MANIPULATION;  Surgeon: Teryl Lucy, MD;  Location: [facility];  Service: Orthopedics;  Laterality: Left;   LAPAROSCOPIC GASTRIC SLEEVE RESECTION N/A 10/26/2019   Procedure: LAPAROSCOPIC SLEEVE GASTRECTOMY;  Surgeon: Luretha Murphy, MD;  Location: [facility];  Service: General;  Laterality: N/A;   LEFT HEART CATHETERIZATION WITH CORONARY ANGIOGRAM N/A 01/12/2014   Procedure: LEFT HEART CATHETERIZATION WITH CORONARY ANGIOGRAM;   Surgeon: Corky Crafts, MD;  Location: [facility];  Service: Cardiovascular;  Laterality: N/A;   ORTHOPEDIC SURGERY Left    arthroscopy x3   PARTIAL KNEE ARTHROPLASTY Right 02/25/2014   Procedure: RIGHT KNEE ARTHROPLASTY CONDYLE AND PLATEAU MEDIAL COMPARTMENT ;  Surgeon: Eulas Post, MD;  Location: [facility];  Service: Orthopedics;  Laterality: Right;   PERIPHERAL VASCULAR CATHETERIZATION N/A 03/29/2015   Procedure: IVC Filter Insertion, and possible thrombectomy;  Surgeon: Renford Dills, MD;  Location: [facility];  Service: Cardiovascular;  Laterality: N/A;   ROTATOR CUFF REPAIR Left    TOTAL KNEE ARTHROPLASTY  12/06/2014   Procedure: TOTAL KNEE ARTHROPLASTY;  Surgeon: Teryl Lucy, MD;  [facility];  Service: Orthopedics;;   TOTAL KNEE REVISION Left 02/08/2016   Procedure: TOTAL KNEE REVISION;  Surgeon: Gean Birchwood, MD;  Location[facility];  Service: Orthopedics;  Laterality: Left;   UPPER GI ENDOSCOPY N/A 10/26/2019   Procedure: UPPER GI ENDOSCOPY;  Surgeon: Luretha Murphy, MD;  Location: [facility];  Service: General;  Laterality: N/A;   URETEROSCOPY WITH HOLMIUM LASER LITHOTRIPSY Right 12/30/2019   Procedure: URETEROSCOPY WITH HOLMIUM LASER LITHOTRIPSY;  Surgeon: Orson Ape, MD;  Location: [facility];  Service: Urology;  Laterality: Right;   Family History  Problem Relation Age of Onset   Coronary artery disease Mother    Hyperlipidemia Mother    Hypertension Mother    Arthritis Mother    Coronary artery disease Father    Heart disease Paternal Grandmother    Alzheimer's disease Paternal Grandfather    Social History   Socioeconomic History   Marital status: Married    Spouse name: Leaf [employer]ernodle   Number of children: 2   Years of education: 12   Highest education level: Not on file  Occupational History   Occupation: Acupuncturist: Engineer, civil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) [employer]  Tobacco Use   Smoking status: Never   Smokeless tobacco: Never  Vaping Use   Vaping  status: Never Used  Substance and Sexual Activity   Alcohol use: Not Currently    Comment: \   Drug use: No   Sexual activity: Yes  Other Topics Concern   Not on file  Social History Narrative   ** Merged History Encounter **       Social Determinants of Health   Financial Resource Strain: Low Risk  (08/08/2022)   Overall Financial Resource Strain (CARDIA)    Difficulty of Paying Living Expenses: Not hard at all  Food Insecurity: No Food Insecurity (08/08/2022)   Hunger Vital Sign    Worried About Running Out of Food in the Last Year: Never true    Ran Out of Food in the Last Year: Never true  Transportation Needs: No Transportation Needs (08/08/2022)   PRAPARE - Administrator, Civil Service (Medical): No    Lack of Transportation (Non-Medical): No  Physical Activity: Sufficiently Active (08/08/2022)   Exercise Vital Sign    Days of Exercise per Week: 7 days    Minutes of Exercise per Session: 40 min  Stress: No Stress Concern Present (08/08/2022)   [employer] of Occupational Health - Occupational Stress Questionnaire    Feeling of Stress : Not at all  Social Connections: Moderately Integrated (08/08/2022)   Social Connection and Isolation Panel [NHANES]    Frequency of Communication with Friends and Family: More than three times a week    Frequency of Social Gatherings with Friends and Family: More than three times a week    Attends Religious Services: 1 to 4 times per year    Active Member of [employer] or Organizations: No    Attends Engineer, structural: Never    Marital Status: Married    Tobacco Counseling Counseling given: Not Answered   Clinical Intake:  Pre-visit preparation completed: Yes  Pain : No/denies pain     Nutritional Status: BMI > 30  Obese Nutritional Risks: None Diabetes: No  How often do you need to have someone help you when you read instructions, pamphlets, or other written materials from your doctor or pharmacy?: 1 -  Never  Interpreter Needed?: No  Information entered by :: NAllen LPN   Activities of Daily Living  08/08/2022   12:33 PM 05/09/2022    9:56 AM  In your present state of health, do you have any difficulty performing the following activities:  Hearing? 0 1  Vision? 1 1  Comment loses vision sometimes, going to eye doctor   Difficulty concentrating or making decisions? 0 0  Walking or climbing stairs? 1 1  Comment due to knees   Dressing or bathing? 0 0  Doing errands, shopping? 0 0  Preparing Food and eating ? N   Using the Toilet? N   In the past six months, have you accidently leaked urine? N   Do you have problems with loss of bowel control? N   Managing your Medications? N   Managing your Finances? N   Housekeeping or managing your Housekeeping? N     Patient Care Team: Lorre Munroe, NP as PCP - General (Internal Medicine) Iran Ouch, MD as PCP - Cardiology (Cardiology) Lorre Munroe, NP (Internal Medicine)  Indicate any recent Medical Services you may have received from other than [facility] providers in the past year (date may be approximate).     Assessment:   This is a routine wellness examination for Richard [name].  Hearing/Vision screen Hearing Screening - Comments:: Denies hearing issues Vision Screening - Comments:: Regular eye exams, [facility]  Dietary issues and exercise activities discussed:     Goals Addressed             This Visit's Progress    Patient Stated       08/08/2022, wants to keep weight off       Depression Screen    08/08/2022   12:42 PM 05/09/2022    9:56 AM 01/03/2022    2:45 PM 05/07/2021    1:36 PM 06/15/2020    3:28 PM 05/17/2020   11:00 AM 01/17/2020   11:47 AM  PHQ 2/9 Scores  PHQ - 2 Score 0 0 0 0 0 0 0  PHQ- 9 Score 0  0  0 0     Fall Risk    08/08/2022   12:41 PM 05/09/2022    9:56 AM 01/03/2022    2:45 PM 05/07/2021    1:35 PM 01/17/2020   11:47 AM  Fall Risk   Falls in the past year? 1 0 0 1 0   Comment got dizzy      Number falls in past yr: 0  0 1 0  Injury with Fall? 0 0 0 0 0  Risk for fall due to : Medication side effect;Impaired vision No Fall Risks No Fall Risks Impaired balance/gait   Follow up Falls prevention discussed;Falls evaluation completed  Falls evaluation completed Falls evaluation completed Falls evaluation completed    MEDICARE RISK AT HOME:  Medicare Risk at Home - 08/08/22 1242     Any stairs in or around the home? Yes    If so, are there any without handrails? No    Home free of loose throw rugs in walkways, pet beds, electrical cords, etc? Yes    Adequate lighting in your home to reduce risk of falls? Yes    Life alert? No    Use of a cane, walker or w/c? No    Grab bars in the bathroom? Yes    Shower chair or bench in shower? Yes    Elevated toilet seat or a handicapped toilet? Yes             TIMED UP AND GO:  Was  the test performed?  No    Cognitive Function:        08/08/2022   12:43 PM 05/07/2021    1:37 PM  6CIT Screen  What Year? 0 points 0 points  What month? 0 points 0 points  What time? 0 points 0 points  Count back from 20 0 points 0 points  Months in reverse 4 points 0 points  Repeat phrase 2 points 2 points  Total Score 6 points 2 points    Immunizations Immunization History  Administered Date(s) Administered   Influenza,inj,Quad PF,6+ Mos 10/01/2018, 01/17/2020, 11/09/2021   Moderna Sars-Covid-2 Vaccination 05/12/2019, 06/09/2019   Td 02/21/2009   Tdap 05/07/2021    TDAP status: Up to date  Flu Vaccine status: Up to date  Pneumococcal vaccine status: Up to date  Covid-19 vaccine status: Information provided on how to obtain vaccines.   Qualifies for Shingles Vaccine? Yes   Zostavax completed No   Shingrix Completed?: Yes  Screening Tests Health Maintenance  Topic Date Due   Zoster Vaccines- Shingrix (1 of 2) Never done   INFLUENZA VACCINE  08/22/2022   Fecal DNA (Cologuard)  02/01/2023   Medicare  Annual Wellness (AWV)  08/08/2023   DTaP/Tdap/Td (3 - Td or Tdap) 05/08/2031   Hepatitis C Screening  Completed   HIV Screening  Completed   HPV VACCINES  Aged Out   COVID-19 Vaccine  Discontinued    Health Maintenance  Health Maintenance Due  Topic Date Due   Zoster Vaccines- Shingrix (1 of 2) Never done    Colorectal cancer screening: Type of screening: Cologuard. Completed 02/01/2020. Repeat every 3 years  Lung Cancer Screening: (Low Dose CT Chest recommended if Age 61-80 years, 20 pack-year currently smoking OR have quit w/in 15years.) does not qualify.   Lung Cancer Screening Referral: no  Additional Screening:  Hepatitis C Screening: does qualify; Completed 11/01/2015  Vision Screening: Recommended annual ophthalmology exams for early detection of glaucoma and other disorders of the eye. Is the patient up to date with their annual eye exam?  Yes  Who is the provider or what is the name of the office in which the patient attends annual eye exams? [facility] If pt is not established with a provider, would they like to be referred to a provider to establish care? No .   Dental Screening: Recommended annual dental exams for proper oral hygiene  Diabetic Foot Exam: n/a  Community Resource Referral / Chronic Care Management: CRR required this visit?  No   CCM required this visit?  No     Plan:     I have personally reviewed and noted the following in the patient's chart:   Medical and social history Use of alcohol, tobacco or illicit drugs  Current medications and supplements including opioid prescriptions. Patient is not currently taking opioid prescriptions. Functional ability and status Nutritional status Physical activity Advanced directives List of other physicians Hospitalizations, surgeries, and ER visits in previous 12 months Vitals Screenings to include cognitive, depression, and falls Referrals and appointments  In addition, I have reviewed  and discussed with patient certain preventive protocols, quality metrics, and best practice recommendations. A written personalized care plan for preventive services as well as general preventive health recommendations were provided to patient.     Barb Merino, LPN   5/73/2202   After Visit Summary: (MyChart) Due to this being a telephonic visit, the after visit summary with patients personalized plan was offered to patient via MyChart  Nurse Notes: none

## 2022-08-08 NOTE — Patient Instructions (Signed)
[name] , Thank you for taking time to come for your Medicare Wellness Visit. I appreciate your ongoing commitment to your health goals. Please review the following plan we discussed and let me know if I can assist you in the future.   These are the goals we discussed:  Goals      Patient Stated     08/08/2022, wants to keep weight off        This is a list of the screening recommended for you and due dates:  Health Maintenance  Topic Date Due   Zoster (Shingles) Vaccine (1 of 2) Never done   Flu Shot  08/22/2022   Cologuard (Stool DNA test)  02/01/2023   Medicare Annual Wellness Visit  08/08/2023   DTaP/Tdap/Td vaccine (3 - Td or Tdap) 05/08/2031   Hepatitis C Screening  Completed   HIV Screening  Completed   HPV Vaccine  Aged Out   COVID-19 Vaccine  Discontinued    Advanced directives: Advance directive discussed with you today.    Conditions/risks identified: none  Next appointment: Follow up in one year for your annual wellness visit   Preventive Care 40-64 Years, Male Preventive care refers to lifestyle choices and visits with your health care provider that can promote health and wellness. What does preventive care include? A yearly physical exam. This is also called an annual well check. Dental exams once or twice a year. Routine eye exams. Ask your health care provider how often you should have your eyes checked. Personal lifestyle choices, including: Daily care of your teeth and gums. Regular physical activity. Eating a healthy diet. Avoiding tobacco and drug use. Limiting alcohol use. Practicing safe sex. Taking low-dose aspirin every day starting at age 38. What happens during an annual well check? The services and screenings done by your health care provider during your annual well check will depend on your age, overall health, lifestyle risk factors, and family history of disease. Counseling  Your health care provider may ask you questions about  your: Alcohol use. Tobacco use. Drug use. Emotional well-being. Home and relationship well-being. Sexual activity. Eating habits. Work and work Astronomer. Screening  You may have the following tests or measurements: Height, weight, and BMI. Blood pressure. Lipid and cholesterol levels. These may be checked every 5 years, or more frequently if you are over 21 years old. Skin check. Lung cancer screening. You may have this screening every year starting at age 60 if you have a 30-pack-year history of smoking and currently smoke or have quit within the past 15 years. Fecal occult blood test (FOBT) of the stool. You may have this test every year starting at age 27. Flexible sigmoidoscopy or colonoscopy. You may have a sigmoidoscopy every 5 years or a colonoscopy every 10 years starting at age 24. Prostate cancer screening. Recommendations will vary depending on your family history and other risks. Hepatitis C blood test. Hepatitis B blood test. Sexually transmitted disease (STD) testing. Diabetes screening. This is done by checking your blood sugar (glucose) after you have not eaten for a while (fasting). You may have this done every 1-3 years. Discuss your test results, treatment options, and if necessary, the need for more tests with your health care provider. Vaccines  Your health care provider may recommend certain vaccines, such as: Influenza vaccine. This is recommended every year. Tetanus, diphtheria, and acellular pertussis (Tdap, Td) vaccine. You may need a Td booster every 10 years. Zoster vaccine. You may need this after age  60. Pneumococcal 13-valent conjugate (PCV13) vaccine. You may need this if you have certain conditions and have not been vaccinated. Pneumococcal polysaccharide (PPSV23) vaccine. You may need one or two doses if you smoke cigarettes or if you have certain conditions. Talk to your health care provider about which screenings and vaccines you need and how  often you need them. This information is not intended to replace advice given to you by your health care provider. Make sure you discuss any questions you have with your health care provider. Document Released: 02/03/2015 Document Revised: 09/27/2015 Document Reviewed: 11/08/2014 Elsevier Interactive Patient Education  2017 [employer].  Fall Prevention in the Home Falls can cause injuries. They can happen to people of all ages. There are many things you can do to make your home safe and to help prevent falls. What can I do on the outside of my home? Regularly fix the edges of walkways and driveways and fix any cracks. Remove anything that might make you trip as you walk through a door, such as a raised step or threshold. Trim any bushes or trees on the path to your home. Use bright outdoor lighting. Clear any walking paths of anything that might make someone trip, such as rocks or tools. Regularly check to see if handrails are loose or broken. Make sure that both sides of any steps have handrails. Any raised decks and porches should have guardrails on the edges. Have any leaves, snow, or ice cleared regularly. Use sand or salt on walking paths during winter. Clean up any spills in your garage right away. This includes oil or grease spills. What can I do in the bathroom? Use night lights. Install grab bars by the toilet and in the tub and shower. Do not use towel bars as grab bars. Use non-skid mats or decals in the tub or shower. If you need to sit down in the shower, use a plastic, non-slip stool. Keep the floor dry. Clean up any water that spills on the floor as soon as it happens. Remove soap buildup in the tub or shower regularly. Attach bath mats securely with double-sided non-slip rug tape. Do not have throw rugs and other things on the floor that can make you trip. What can I do in the bedroom? Use night lights. Make sure that you have a light by your bed that is easy to  reach. Do not use any sheets or blankets that are too big for your bed. They should not hang down onto the floor. Have a firm chair that has side arms. You can use this for support while you get dressed. Do not have throw rugs and other things on the floor that can make you trip. What can I do in the kitchen? Clean up any spills right away. Avoid walking on wet floors. Keep items that you use a lot in easy-to-reach places. If you need to reach something above you, use a strong step stool that has a grab bar. Keep electrical cords out of the way. Do not use floor polish or wax that makes floors slippery. If you must use wax, use non-skid floor wax. Do not have throw rugs and other things on the floor that can make you trip. What can I do with my stairs? Do not leave any items on the stairs. Make sure that there are handrails on both sides of the stairs and use them. Fix handrails that are broken or loose. Make sure that handrails are as long as  the stairways. Check any carpeting to make sure that it is firmly attached to the stairs. Fix any carpet that is loose or worn. Avoid having throw rugs at the top or bottom of the stairs. If you do have throw rugs, attach them to the floor with carpet tape. Make sure that you have a light switch at the top of the stairs and the bottom of the stairs. If you do not have them, ask someone to add them for you. What else can I do to help prevent falls? Wear shoes that: Do not have high heels. Have rubber bottoms. Are comfortable and fit you well. Are closed at the toe. Do not wear sandals. If you use a stepladder: Make sure that it is fully opened. Do not climb a closed stepladder. Make sure that both sides of the stepladder are locked into place. Ask someone to hold it for you, if possible. Clearly mark and make sure that you can see: Any grab bars or handrails. First and last steps. Where the edge of each step is. Use tools that help you move  around (mobility aids) if they are needed. These include: Canes. Walkers. Scooters. Crutches. Turn on the lights when you go into a dark area. Replace any light bulbs as soon as they burn out. Set up your furniture so you have a clear path. Avoid moving your furniture around. If any of your floors are uneven, fix them. If there are any pets around you, be aware of where they are. Review your medicines with your doctor. Some medicines can make you feel dizzy. This can increase your chance of falling. Ask your doctor what other things that you can do to help prevent falls. This information is not intended to replace advice given to you by your health care provider. Make sure you discuss any questions you have with your health care provider. Document Released: 11/03/2008 Document Revised: 06/15/2015 Document Reviewed: 02/11/2014 Elsevier Interactive Patient Education  2017 [employer].

## 2022-08-09 DIAGNOSIS — H40032 Anatomical narrow angle, left eye: Secondary | ICD-10-CM | POA: Diagnosis not present

## 2022-08-09 DIAGNOSIS — H2512 Age-related nuclear cataract, left eye: Secondary | ICD-10-CM | POA: Diagnosis not present

## 2022-08-09 DIAGNOSIS — H40031 Anatomical narrow angle, right eye: Secondary | ICD-10-CM | POA: Diagnosis not present

## 2022-08-09 DIAGNOSIS — H2511 Age-related nuclear cataract, right eye: Secondary | ICD-10-CM | POA: Diagnosis not present

## 2022-08-20 DIAGNOSIS — H40031 Anatomical narrow angle, right eye: Secondary | ICD-10-CM | POA: Diagnosis not present

## 2022-08-26 ENCOUNTER — Other Ambulatory Visit: Payer: Self-pay | Admitting: Internal Medicine

## 2022-08-27 NOTE — Telephone Encounter (Signed)
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, unspecified laterality   [place] [facility] [place], Salvadore Oxford, NP   3 months ago Encounter for general adult medical examination with abnormal findings   [place] [facility] [place], Salvadore Oxford, NP   7 months ago Orthostatic hypotension   [place] [facility] [place], [place] W, NP   9 months ago CHF (congestive heart failure), NYHA class II, chronic, diastolic [facility])   [place] [facility] [place], Salvadore Oxford, NP       Future Appointments             In 2 months Baity, Salvadore Oxford, NP [place] [facility], [facility]

## 2022-08-28 DIAGNOSIS — R42 Dizziness and giddiness: Secondary | ICD-10-CM | POA: Diagnosis not present

## 2022-08-28 DIAGNOSIS — H903 Sensorineural hearing loss, bilateral: Secondary | ICD-10-CM | POA: Diagnosis not present

## 2022-09-04 DIAGNOSIS — H40032 Anatomical narrow angle, left eye: Secondary | ICD-10-CM | POA: Diagnosis not present

## 2022-09-05 ENCOUNTER — Other Ambulatory Visit: Payer: Self-pay | Admitting: Internal Medicine

## 2022-09-06 NOTE — Telephone Encounter (Signed)
Rx 06/19/22 #180- too soon Requested Prescriptions  Pending Prescriptions Disp Refills   colchicine 0.6 MG tablet [Pharmacy Med Name: COLCHICINE 0.6MG  TABLET] 180 tablet 0    Sig: TAKE ONE TABLET BY MOUTH TWICE A DAY     Endocrinology:  Gout Agents - colchicine Failed - 09/05/2022  2:00 PM      Failed - CBC within normal limits and completed in the last 12 months    WBC  Date Value Ref Range Status  05/09/2022 6.0 3.8 - 10.8 Thousand/uL Final   RBC  Date Value Ref Range Status  05/09/2022 5.10 4.20 - 5.80 Million/uL Final   Hemoglobin  Date Value Ref Range Status  05/09/2022 15.8 13.2 - 17.1 g/dL Final  19/14/7829 56.2 13.0 - 17.7 g/dL Final   HCT  Date Value Ref Range Status  05/09/2022 46.2 38.5 - 50.0 % Final   Hematocrit  Date Value Ref Range Status  02/05/2021 45.5 37.5 - 51.0 % Final   MCHC  Date Value Ref Range Status  05/09/2022 34.2 32.0 - 36.0 g/dL Final   [facility]  Date Value Ref Range Status  05/09/2022 31.0 27.0 - 33.0 pg Final   MCV  Date Value Ref Range Status  05/09/2022 90.6 80.0 - 100.0 fL Final  02/05/2021 88 79 - 97 fL Final   No results found for: "PLTCOUNTKUC", "LABPLAT", "POCPLA" RDW  Date Value Ref Range Status  05/09/2022 12.5 11.0 - 15.0 % Final  02/05/2021 12.6 11.6 - 15.4 % Final         Passed - Cr in normal range and within 360 days    Creat  Date Value Ref Range Status  05/09/2022 1.30 0.70 - 1.35 mg/dL Final         Passed - ALT in normal range and within 360 days    ALT  Date Value Ref Range Status  05/09/2022 20 9 - 46 U/L Final         Passed - AST in normal range and within 360 days    AST  Date Value Ref Range Status  05/09/2022 20 10 - 35 U/L Final         Passed - Valid encounter within last 12 months    Recent Outpatient Visits           2 months ago Lightheadedness   [place] [facility] [place], Salvadore Oxford, NP   2 months ago 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, unspecified  laterality   [place] [facility] [place], Salvadore Oxford, NP   4 months ago Encounter for general adult medical examination with abnormal findings   [place] [facility] [place], Salvadore Oxford, NP   8 months ago Orthostatic hypotension   [place] [facility] [place], Salvadore Oxford, NP   10 months ago CHF (congestive heart failure), NYHA class II, chronic, diastolic (HCC)   [place] [facility] [place], Salvadore Oxford, NP       Future Appointments             In 2 months Baity, Salvadore Oxford, NP [facility], [facility]

## 2022-09-17 ENCOUNTER — Ambulatory Visit (INDEPENDENT_AMBULATORY_CARE_PROVIDER_SITE_OTHER): Payer: Medicare Other | Admitting: Internal Medicine

## 2022-09-17 ENCOUNTER — Encounter: Payer: Self-pay | Admitting: Internal Medicine

## 2022-09-17 VITALS — BP 128/82 | HR 63 | Temp 97.1°F | Wt 240.0 lb

## 2022-09-17 DIAGNOSIS — R0981 Nasal congestion: Secondary | ICD-10-CM | POA: Diagnosis not present

## 2022-09-17 DIAGNOSIS — J029 Acute pharyngitis, unspecified: Secondary | ICD-10-CM

## 2022-09-17 LAB — POCT RAPID STREP A (OFFICE): Rapid Strep A Screen: NEGATIVE

## 2022-09-17 LAB — POC COVID19 BINAXNOW: SARS Coronavirus 2 Ag: NEGATIVE

## 2022-09-17 MED ORDER — METHYLPREDNISOLONE ACETATE 80 MG/ML IJ SUSP
80.0000 mg | Freq: Once | INTRAMUSCULAR | Status: AC
Start: 2022-09-17 — End: 2022-09-17
  Administered 2022-09-17: 80 mg via INTRAMUSCULAR

## 2022-09-17 NOTE — Addendum Note (Signed)
Addended by: Paschal Dopp on: 09/17/2022 04:22 PM   Modules accepted: Orders

## 2022-09-17 NOTE — Progress Notes (Signed)
HPI  Pt presents to the clinic today with c/o nasal congestion and sore throat.  This started 3 days ago. He is not able to blow anything out of his nose. He has had some difficulty swallowing. He denies headache, runny nose, ear pain, cough or shortness of breath. He denies nausea, vomiting or diarrhea. He denies fever, chills or body aches. He has tried cough drops OTC with minimal relief of symptoms. He has not had sick contacts.  Review of Systems      Past Medical History:  Diagnosis Date   Arthritis    Arthrofibrosis of total knee arthroplasty (HCC) 05/30/2015   Bilateral pulmonary embolism (HCC)    a. 03/2015 CTA: acute bilat PE; b. IVC filter placed in 2017--> removed 2018.   Chest pain    a. 2015 Abnl MV;  b. 2015 Cath: nl cors.   Chronic diastolic CHF (congestive heart failure) (HCC)    a. 03/2015 Echo: EF 55-65%, poor windows, mildly dil RV.   DVT (deep venous thrombosis) (HCC)    a. 03/2015 U/S: occlusive DVT w/in the distal aspect of the Left fem vein through the L popliteal vein.   Dyspnea    On exertion   GERD (gastroesophageal reflux disease)    Headache    Hypertension    Hypertensive heart disease    Obesity    OSA (obstructive sleep apnea)    uses CPAP nightly settings at 16    PAF (paroxysmal atrial fibrillation) (HCC)    Pneumonia    hx of years ago    Primary localized osteoarthritis of left knee    a. 11/2014 s/p L TKA.   Primary localized osteoarthritis of right knee    a. 02/2014 s/p R Partial Knee arthroplasty.   Stiffness of left knee    a. 12/2014 s/p manipulation under anesthesia.   Urine incontinence     Family History  Problem Relation Age of Onset   Coronary artery disease Mother    Hyperlipidemia Mother    Hypertension Mother    Arthritis Mother    Coronary artery disease Father    Heart disease Paternal Grandmother    Alzheimer's disease Paternal Grandfather     Social History   Socioeconomic History   Marital status: Married     Spouse name: Laurel Simard   Number of children: 2   Years of education: 12   Highest education level: Not on file  Occupational History   Occupation: Acupuncturist: Engineer, civil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) [employer]  Tobacco Use   Smoking status: Never   Smokeless tobacco: Never  Vaping Use   Vaping status: Never Used  Substance and Sexual Activity   Alcohol use: Not Currently    Comment: \   Drug use: No   Sexual activity: Yes  Other Topics Concern   Not on file  Social History Narrative   ** Merged History Encounter **       Social Determinants of Health   Financial Resource Strain: Low Risk  (08/08/2022)   Overall Financial Resource Strain (CARDIA)    Difficulty of Paying Living Expenses: Not hard at all  Food Insecurity: No Food Insecurity (08/08/2022)   Hunger Vital Sign    Worried About Running Out of Food in the Last Year: Never true    Ran Out of Food in the Last Year: Never true  Transportation Needs: No Transportation Needs (08/08/2022)   PRAPARE - Transportation    Lack of Transportation (Medical): No    Lack  of Transportation (Non-Medical): No  Physical Activity: Sufficiently Active (08/08/2022)   Exercise Vital Sign    Days of Exercise per Week: 7 days    Minutes of Exercise per Session: 40 min  Stress: No Stress Concern Present (08/08/2022)   [employer] of Occupational Health - Occupational Stress Questionnaire    Feeling of Stress : Not at all  Social Connections: Moderately Integrated (08/08/2022)   Social Connection and Isolation Panel [NHANES]    Frequency of Communication with Friends and Family: More than three times a week    Frequency of Social Gatherings with Friends and Family: More than three times a week    Attends Religious Services: 1 to 4 times per year    Active Member of [employer] or Organizations: No    Attends Banker Meetings: Never    Marital Status: Married  Catering manager Violence: Not At Risk (08/08/2022)   Humiliation, Afraid, Rape, and Kick  questionnaire    Fear of Current or Ex-Partner: No    Emotionally Abused: No    Physically Abused: No    Sexually Abused: No    Allergies  Allergen Reactions   Penicillins Anaphylaxis and Other (See Comments)    From childhood; uncertain of reaction Has patient had a PCN reaction causing immediate rash, facial/tongue/throat swelling, SOB or lightheadedness with hypotension: Unk Has patient had a PCN reaction causing severe rash involving mucus membranes or skin necrosis: Unk Has patient had a PCN reaction that required hospitalization: Unk Has patient had a PCN reaction occurring within the last 10 years: No If all of the above answers are "NO", then may proceed with Cephalosporin use.   Vancomycin Other (See Comments)    Red Mans' syndrome   Penicillins Other (See Comments)    Childhood   Vancomycin Other (See Comments)    Red man Syndrome     Constitutional: Positive headache, fatigue and fever. Denies abrupt weight changes.  HEENT:  Positive sore throat. Denies eye redness, eye pain, pressure behind the eyes, facial pain, nasal congestion, ear pain, ringing in the ears, wax buildup, runny nose or bloody nose. Respiratory: Positive cough. Denies difficulty breathing or shortness of breath.  Cardiovascular: Denies chest pain, chest tightness, palpitations or swelling in the hands or feet.   No other specific complaints in a complete review of systems (except as listed in HPI above).  Objective:  BP 128/82 (BP Location: Left Arm, Patient Position: Sitting, Cuff Size: Large)   Pulse 63   Temp (!) 97.1 F (36.2 C) (Temporal)   Wt 240 lb (108.9 kg)   SpO2 98%   BMI 35.44 kg/m   Wt Readings from Last 3 Encounters:  08/08/22 226 lb (102.5 kg)  07/29/22 243 lb (110.2 kg)  06/20/22 239 lb (108.4 kg)     General: Appears his stated age, obese, in NAD. HEENT: Head: normal shape and size, no sinus tenderness noted; Eyes: sclera white, no icterus, conjunctiva pink; Ears: Tm's  gray and intact, normal light reflex; Nose: mucosa pink and moist, septum midline; Throat/Mouth: + PND. Teeth present, mucosa erythematous and moist, no exudate noted, no lesions or ulcerations noted.  Neck: No cervical lymphadenopathy.  Cardiovascular: Normal rate and rhythm. S1,S2 noted.  No murmur, rubs or gallops noted.  Pulmonary/Chest: Normal effort and positive vesicular breath sounds. No respiratory distress. No wheezes, rales or ronchi noted.       Assessment & Plan:   Nasal congestion, sore throat:  Likely viral Rapid strep negative Rapid COVID-negative  Get some rest and drink plenty of water Do salt water gargles for the sore throat 80 mg Depo-Medrol IM x 1 Recommend Zyrtec and Flonase OTC for symptom management  RTC in 2 months, follow-up chronic conditions   Nicki Reaper, NP

## 2022-09-17 NOTE — Patient Instructions (Signed)
Sore Throat When you have a sore throat, your throat may feel: Tender. Burning. Irritated. Scratchy. Painful when you swallow. Painful when you talk. Many things can cause a sore throat, such as: An infection. Allergies. Dry air. Smoke or pollution. Radiation treatment for cancer. Gastroesophageal reflux disease (GERD). A tumor. A sore throat can be the first sign of another sickness. It can happen with other problems, like: Coughing. Sneezing. Fever. Swelling of the glands in the neck. Most sore throats go away without treatment. Follow these instructions at home:     Medicines Take over-the-counter and prescription medicines only as told by your doctor. Children often get sore throats. Do not give your child aspirin. Use throat sprays to soothe your throat as told by your health care provider. Managing pain To help with pain: Sip warm liquids, such as broth, herbal tea, or warm water. Eat or drink cold or frozen liquids, such as frozen ice pops. Rinse your mouth (gargle) with a salt water mixture 3-4 times a day or as needed. To make salt water, dissolve -1 tsp (3-6 g) of salt in 1 cup (237 mL) of warm water. Do not swallow this mixture. Suck on hard candy or throat lozenges. Put a cool-mist humidifier in your bedroom at night. Sit in the bathroom with the door closed for 5-10 minutes while you run hot water in the shower. General instructions Do not smoke or use any products that contain nicotine or tobacco. If you need help quitting, ask your doctor. Get plenty of rest. Drink enough fluid to keep your pee (urine) pale yellow. Wash your hands often for at least 20 seconds with soap and water. If soap and water are not available, use hand sanitizer. Contact a doctor if: You have a fever for more than 2-3 days. You keep having symptoms for more than 2-3 days. Your throat does not get better in 7 days. You have a fever and your symptoms suddenly get worse. Your  child who is 3 months to 3 years old has a temperature of 102.2F (39C) or higher. Get help right away if: You have trouble breathing. You cannot swallow fluids, soft foods, or your spit. You have swelling in your throat or neck that gets worse. You feel like you may vomit (nauseous) and this feeling lasts a long time. You cannot stop vomiting. These symptoms may be an emergency. Get help right away. Call your local emergency services (911 in the [place] Do not wait to see if the symptoms will go away. Do not drive yourself to the hospital. Summary A sore throat is a painful, burning, irritated, or scratchy throat. Many things can cause a sore throat. Take over-the-counter medicines only as told by your doctor. Get plenty of rest. Drink enough fluid to keep your pee (urine) pale yellow. Contact a doctor if your symptoms get worse or your sore throat does not get better within 7 days. This information is not intended to replace advice given to you by your health care provider. Make sure you discuss any questions you have with your health care provider. Document Revised: 04/05/2020 Document Reviewed: 04/05/2020 Elsevier Patient Education  2024 Elsevier Inc.  

## 2022-09-18 DIAGNOSIS — H40032 Anatomical narrow angle, left eye: Secondary | ICD-10-CM | POA: Diagnosis not present

## 2022-09-18 DIAGNOSIS — H40031 Anatomical narrow angle, right eye: Secondary | ICD-10-CM | POA: Diagnosis not present

## 2022-09-18 DIAGNOSIS — H2511 Age-related nuclear cataract, right eye: Secondary | ICD-10-CM | POA: Diagnosis not present

## 2022-09-18 DIAGNOSIS — H2512 Age-related nuclear cataract, left eye: Secondary | ICD-10-CM | POA: Diagnosis not present

## 2022-09-25 ENCOUNTER — Other Ambulatory Visit: Payer: Self-pay | Admitting: Internal Medicine

## 2022-09-26 NOTE — Telephone Encounter (Signed)
Requested Prescriptions  Pending Prescriptions Disp Refills   colchicine 0.6 MG tablet [Pharmacy Med Name: COLCHICINE 0.6MG  TABLET] 180 tablet 2    Sig: TAKE ONE TABLET BY MOUTH TWICE A DAY     Endocrinology:  Gout Agents - colchicine Failed - 09/25/2022  4:48 PM      Failed - CBC within normal limits and completed in the last 12 months    WBC  Date Value Ref Range Status  05/09/2022 6.0 3.8 - 10.8 Thousand/uL Final   RBC  Date Value Ref Range Status  05/09/2022 5.10 4.20 - 5.80 Million/uL Final   Hemoglobin  Date Value Ref Range Status  05/09/2022 15.8 13.2 - 17.1 g/dL Final  72/53/6644 03.4 13.0 - 17.7 g/dL Final   HCT  Date Value Ref Range Status  05/09/2022 46.2 38.5 - 50.0 % Final   Hematocrit  Date Value Ref Range Status  02/05/2021 45.5 37.5 - 51.0 % Final   MCHC  Date Value Ref Range Status  05/09/2022 34.2 32.0 - 36.0 g/dL Final   [facility]  Date Value Ref Range Status  05/09/2022 31.0 27.0 - 33.0 pg Final   MCV  Date Value Ref Range Status  05/09/2022 90.6 80.0 - 100.0 fL Final  02/05/2021 88 79 - 97 fL Final   No results found for: "PLTCOUNTKUC", "LABPLAT", "POCPLA" RDW  Date Value Ref Range Status  05/09/2022 12.5 11.0 - 15.0 % Final  02/05/2021 12.6 11.6 - 15.4 % Final         Passed - Cr in normal range and within 360 days    Creat  Date Value Ref Range Status  05/09/2022 1.30 0.70 - 1.35 mg/dL Final         Passed - ALT in normal range and within 360 days    ALT  Date Value Ref Range Status  05/09/2022 20 9 - 46 U/L Final         Passed - AST in normal range and within 360 days    AST  Date Value Ref Range Status  05/09/2022 20 10 - 35 U/L Final         Passed - Valid encounter within last 12 months    Recent Outpatient Visits           1 week ago Sore throat   [place] [facility] [place], Salvadore Oxford, NP   3 months ago Lightheadedness   [place] [facility] [place], Salvadore Oxford, NP   3 months ago  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, unspecified laterality   [place] [facility] [place], Salvadore Oxford, NP   4 months ago Encounter for general adult medical examination with abnormal findings   [place] [facility], Salvadore Oxford, NP   8 months ago Orthostatic hypotension   [place] [facility] [place], Salvadore Oxford, NP       Future Appointments             In 1 month Baity, Salvadore Oxford, NP [facility] [facility], [facility]

## 2022-10-03 ENCOUNTER — Ambulatory Visit: Payer: Medicare Other | Admitting: Internal Medicine

## 2022-10-15 ENCOUNTER — Other Ambulatory Visit: Payer: Self-pay | Admitting: Cardiovascular Disease

## 2022-10-15 DIAGNOSIS — I480 Paroxysmal atrial fibrillation: Secondary | ICD-10-CM

## 2022-10-15 NOTE — Telephone Encounter (Signed)
Prescription refill request for Eliquis received. Indication: PAF Last office visit: 01/31/22  Terrilee Files MD Scr: 1.30 on 05/09/22  Epic Age: 62 Weight: 104.3kg  Based on above findings Eliquis 5mg  twice daily is the appropriate dose.  Refill approved.

## 2022-10-15 NOTE — Telephone Encounter (Signed)
Prescription refill request for Eliquis received. Indication: Afib  Last office visit: 01/31/22 Kirke Corin)  Scr: 1.30 (05/09/22)  Age: 62 Weight: 108.9kg  Appropriate dose. Refill sent.

## 2022-11-01 ENCOUNTER — Other Ambulatory Visit: Payer: Self-pay | Admitting: Internal Medicine

## 2022-11-01 NOTE — Telephone Encounter (Signed)
Requested Prescriptions  Pending Prescriptions Disp Refills   allopurinol (ZYLOPRIM) 100 MG tablet [Pharmacy Med Name: ALLOPURINOL 100MG  TABLET] 90 tablet 0    Sig: TAKE ONE TABLET (100 MG TOTAL) BY MOUTH DAILY.     Endocrinology:  Gout Agents - allopurinol Failed - 11/01/2022 10:59 AM      Failed - Uric Acid in normal range and within 360 days    Uric Acid, Serum  Date Value Ref Range Status  05/09/2022 8.5 (H) 4.0 - 8.0 mg/dL Final    Comment:    Therapeutic target for gout patients: <6.0 mg/dL .          Failed - CBC within normal limits and completed in the last 12 months    WBC  Date Value Ref Range Status  05/09/2022 6.0 3.8 - 10.8 Thousand/uL Final   RBC  Date Value Ref Range Status  05/09/2022 5.10 4.20 - 5.80 Million/uL Final   Hemoglobin  Date Value Ref Range Status  05/09/2022 15.8 13.2 - 17.1 g/dL Final  21/30/8657 84.6 13.0 - 17.7 g/dL Final   HCT  Date Value Ref Range Status  05/09/2022 46.2 38.5 - 50.0 % Final   Hematocrit  Date Value Ref Range Status  02/05/2021 45.5 37.5 - 51.0 % Final   MCHC  Date Value Ref Range Status  05/09/2022 34.2 32.0 - 36.0 g/dL Final   [facility]  Date Value Ref Range Status  05/09/2022 31.0 27.0 - 33.0 pg Final   MCV  Date Value Ref Range Status  05/09/2022 90.6 80.0 - 100.0 fL Final  02/05/2021 88 79 - 97 fL Final   No results found for: "PLTCOUNTKUC", "LABPLAT", "POCPLA" RDW  Date Value Ref Range Status  05/09/2022 12.5 11.0 - 15.0 % Final  02/05/2021 12.6 11.6 - 15.4 % Final         Passed - Cr in normal range and within 360 days    Creat  Date Value Ref Range Status  05/09/2022 1.30 0.70 - 1.35 mg/dL Final         Passed - Valid encounter within last 12 months    Recent Outpatient Visits           1 month ago Sore throat   [place] [facility] [place], Salvadore Oxford, NP   4 months ago Lightheadedness   [place] [facility] [place], Salvadore Oxford, NP   4 months ago Idiopathic  chronic gout 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 tophus, unspecified laterality   [place] [facility] [place], Salvadore Oxford, NP   5 months ago Encounter for general adult medical examination with abnormal findings   [place] [facility] [place], Salvadore Oxford, NP   10 months ago Orthostatic hypotension   [place] [facility] [place], Salvadore Oxford, NP       Future Appointments             In 1 week Sampson Si, Salvadore Oxford, NP [place] [facility], [facility]

## 2022-11-06 DIAGNOSIS — G4733 Obstructive sleep apnea (adult) (pediatric): Secondary | ICD-10-CM | POA: Diagnosis not present

## 2022-11-08 ENCOUNTER — Ambulatory Visit (INDEPENDENT_AMBULATORY_CARE_PROVIDER_SITE_OTHER): Payer: Medicare Other | Admitting: Internal Medicine

## 2022-11-08 ENCOUNTER — Encounter: Payer: Self-pay | Admitting: Internal Medicine

## 2022-11-08 VITALS — BP 106/82 | HR 54 | Ht 69.0 in | Wt 245.0 lb

## 2022-11-08 DIAGNOSIS — I5032 Chronic diastolic (congestive) heart failure: Secondary | ICD-10-CM

## 2022-11-08 DIAGNOSIS — M1A0790 Idiopathic chronic gout, unspecified ankle and foot, without tophus (tophi): Secondary | ICD-10-CM

## 2022-11-08 DIAGNOSIS — Z86711 Personal history of pulmonary embolism: Secondary | ICD-10-CM

## 2022-11-08 DIAGNOSIS — I10 Essential (primary) hypertension: Secondary | ICD-10-CM | POA: Diagnosis not present

## 2022-11-08 DIAGNOSIS — R739 Hyperglycemia, unspecified: Secondary | ICD-10-CM

## 2022-11-08 DIAGNOSIS — Z86718 Personal history of other venous thrombosis and embolism: Secondary | ICD-10-CM

## 2022-11-08 DIAGNOSIS — I480 Paroxysmal atrial fibrillation: Secondary | ICD-10-CM

## 2022-11-08 DIAGNOSIS — Z6836 Body mass index (BMI) 36.0-36.9, adult: Secondary | ICD-10-CM

## 2022-11-08 DIAGNOSIS — Z136 Encounter for screening for cardiovascular disorders: Secondary | ICD-10-CM | POA: Diagnosis not present

## 2022-11-08 DIAGNOSIS — Z23 Encounter for immunization: Secondary | ICD-10-CM

## 2022-11-08 DIAGNOSIS — G4733 Obstructive sleep apnea (adult) (pediatric): Secondary | ICD-10-CM | POA: Diagnosis not present

## 2022-11-08 DIAGNOSIS — K219 Gastro-esophageal reflux disease without esophagitis: Secondary | ICD-10-CM

## 2022-11-08 DIAGNOSIS — M150 Primary generalized (osteo)arthritis: Secondary | ICD-10-CM | POA: Diagnosis not present

## 2022-11-08 DIAGNOSIS — E66812 Obesity, class 2: Secondary | ICD-10-CM

## 2022-11-08 MED ORDER — BACLOFEN 10 MG PO TABS: 10.0000 mg | ORAL_TABLET | Freq: Two times a day (BID) | ORAL | 1 refills | Status: DC

## 2022-11-08 MED ORDER — FUROSEMIDE 20 MG PO TABS: 20.0000 mg | ORAL_TABLET | Freq: Every day | ORAL | 0 refills | Status: DC | PRN

## 2022-11-08 NOTE — Assessment & Plan Note (Signed)
Uric acid levels today Encourage low purine diet Continue allopurinol and colchicine as needed

## 2022-11-08 NOTE — Assessment & Plan Note (Signed)
Continue flecainide and Eliquis He will continue to follow with cardiology

## 2022-11-08 NOTE — Assessment & Plan Note (Signed)
Avoid foods that trigger reflux Encourage weight loss as this can help reduce reflux symptoms Continue pantoprazole

## 2022-11-08 NOTE — Patient Instructions (Signed)
Preventing Influenza, Adult Influenza, also known as the flu, is an infection caused by a germ called a virus. It affects your respiratory system, which includes your nose, throat, windpipe, and lungs. The flu causes cold symptoms, as well as a high fever and body aches. The flu is contagious, which means it spreads easily from person to person. You're more likely to get it during flu season, which goes from December to March. You can get the flu by: Breathing in droplets when an infected person coughs or sneezes. Touching something that has the germs on it and then touching your mouth, nose, or eyes. How can the flu affect me? Having the flu can lead to other infections. These may include: A lung infection, like pneumonia. An ear infection. A sinus infection. If you get the flu, you may infect the people around you. The flu can be very serious for: Babies. People 44 years old and older. People with long-term diseases. What can increase my risk? You may be more likely to get the flu if: You don't wash your hands often. You have close contact with a lot of people during flu season. You touch your mouth, eyes, or nose without first washing your hands. You don't get the vaccine for the flu each year. This vaccine is also called the flu shot. You work in health care. You may be at risk for more serious flu symptoms if: You're 65 years and older. You're pregnant. Your body's defense, or immune, system is weak. You have a condition that makes the flu worse. You're very overweight. What actions can I take to prevent the flu? Lifestyle Keep your body's immune system strong. To do this: Eat a healthy diet. Drink enough fluid to keep your pee (urine) pale yellow. Get enough sleep. Get enough exercise. Do not use any products that contain nicotine or tobacco. These products include cigarettes, chewing tobacco, and vaping devices, such as e-cigarettes. If you need help quitting, ask your health  care provider. Medicines Get a flu shot each year. It's the best way to prevent the flu. Try to get the shot as soon as you can in the fall. But getting a flu shot in the winter or spring instead is still a good idea. Flu season can last into early spring. You need to get a new flu shot each year. This is because the germs that cause the flu can change slightly from year to year. If you get the flu after you get the shot, the shot can make your illness shorter and milder. It can also stop you from getting other serious infections caused by the flu. If you're pregnant, you can and should get a flu shot. Check with your provider before getting a flu shot if you've had a reaction to the shot in the past. You may be able to get the vaccine as a nasal spray instead. But the nasal spray may not work as well as the shot. Check with your provider if you have questions about this. Most people who have the flu get better by resting at home and drinking lots of fluids. But a medicine called an antiviral may lessen your symptoms and make the flu go away faster. People with more serious flu symptoms may need this medicine. Ask your provider if you need it. The medicine must be started within a few days of getting symptoms.  General information     Practice good health habits. Good habits are key during flu season. Avoid  contact with people who are sick with flu or cold symptoms. Wash your hands with soap and water often and for at least 20 seconds. If soap and water aren't available, use hand sanitizer. Try not to touch your face, especially if you haven't washed your hands. Clean surfaces at home and at work that may have germs on them. Use a cleanser called a disinfectant that kills germs. If you get the flu: Stay home until your symptoms, such as your fever, have been gone for at least 24 hours. Cover your mouth and nose when you cough or sneeze. Avoid close contact with others.  Where to find more  information Centers for Disease Control and Prevention (CDC): TonerPromos.no American Academy of Family Physicians (AAFP): familydoctor.org Contact a health care provider if: You have the flu, and you get new symptoms. You have watery poop, or diarrhea. You have a fever. Your cough gets worse. You make more mucus. Get help right away if: You have trouble breathing. You have chest pain. These symptoms may be an emergency. Get help right away. Call 911. Do not wait to see if the symptoms will go away. Do not drive yourself to the hospital. This information is not intended to replace advice given to you by your health care provider. Make sure you discuss any questions you have with your health care provider. Document Revised: 04/04/2022 Document Reviewed: 04/04/2022 Elsevier Patient Education  2024 [employer].

## 2022-11-08 NOTE — Assessment & Plan Note (Signed)
Encouraged diet and exercise for weight loss ?

## 2022-11-08 NOTE — Assessment & Plan Note (Signed)
Compensated Reinforced DASH diet Monitor daily weights Furosemide refilled today [facility] today

## 2022-11-08 NOTE — Assessment & Plan Note (Signed)
Controlled on flecainide and furosemide Reinforced DASH diet and exercise for weight loss C-[facility] today

## 2022-11-08 NOTE — Assessment & Plan Note (Signed)
Encourage weight loss as this can help reduce sleep apnea symptoms Continue CPAP use 

## 2022-11-08 NOTE — Assessment & Plan Note (Signed)
Continue tramadol and baclofen Encourage weight loss as this can help reduce joint pain

## 2022-11-08 NOTE — Assessment & Plan Note (Signed)
On lifelong Eliquis

## 2022-11-08 NOTE — Progress Notes (Signed)
Subjective:    Patient ID: Richard [name], male    DOB: 05-13-1960, 62 y.o.   MRN: [id number]  HPI  Patient presents to clinic today for 2-month follow-up of chronic conditions.  OA: Generalized.  Managed with baclofen and tramadol as needed with good relief of symptoms.  He follows with orthopedics.  History of DVT/PE: On lifelong eliquis.  He no longer follows with hematology.  CHF, diastolic: He denies chronic cough, shortness of breath or lower extremity edema.  He is taking flecainide and furosemide as prescribed.  Echo from 03/2015 reviewed.  He follows with cardiology.  GERD: Triggered by tomato-based foods.  He denies breakthrough on pantoprazole.  There is no upper GI on file.  HTN: His BP today is 106/82.  He is taking flecainide and furosemide as prescribed.  ECG from 01/2022 reviewed.  OSA: He averages 7 hours of sleep per night with the use of his CPAP.  There is no sleep study on file.  A-fib: Paroxysmal, managed on eliquis.  ECG from 01/2022 reviewed.  Gout: He denies recent flare.  He is taking allopurinol as prescribed and has colchicine to take as needed.  He does not follow with rheumatology.  Frequent headaches: He has had a vision exam, now following up with ENT. MRI brain from 06/2022 reviewed.   Review of Systems  Past Medical History:  Diagnosis Date   Arthritis    Arthrofibrosis of total knee arthroplasty (HCC) 05/30/2015   Bilateral pulmonary embolism (HCC)    a. 03/2015 CTA: acute bilat PE; b. IVC filter placed in 2017--> removed 2018.   Chest pain    a. 2015 Abnl MV;  b. 2015 Cath: nl cors.   Chronic diastolic CHF (congestive heart failure) (HCC)    a. 03/2015 Echo: EF 55-65%, poor windows, mildly dil RV.   DVT (deep venous thrombosis) (HCC)    a. 03/2015 U/S: occlusive DVT w/in the distal aspect of the Left fem vein through the L popliteal vein.   Dyspnea    On exertion   GERD (gastroesophageal reflux disease)    Headache    Hypertension     Hypertensive heart disease    Obesity    OSA (obstructive sleep apnea)    uses CPAP nightly settings at 15    PAF (paroxysmal atrial fibrillation) (HCC)    Pneumonia    hx of years ago    Primary localized osteoarthritis of left knee    a. 11/2014 s/p L TKA.   Primary localized osteoarthritis of right knee    a. 02/2014 s/p R Partial Knee arthroplasty.   Stiffness of left knee    a. 12/2014 s/p manipulation under anesthesia.   Urine incontinence     Current Outpatient Medications  Medication Sig Dispense Refill   allopurinol (ZYLOPRIM) 100 MG tablet TAKE ONE TABLET (100 MG TOTAL) BY MOUTH DAILY. 90 tablet 0   baclofen (LIORESAL) 10 MG tablet TAKE 1 TO 2 TABLETS BY MOUTH 3 TIMES DAILY AS NEEDED FOR MUSCLE SPASMS 180 each 0   colchicine 0.6 MG tablet TAKE ONE TABLET BY MOUTH TWICE A DAY 180 tablet 2   ELIQUIS 5 MG TABS tablet TAKE ONE TABLET BY MOUTH TWO TIMES DAILY 180 tablet 1   flecainide (TAMBOCOR) 100 MG tablet Take (1) one tablet by mouth (2) twice daily 180 tablet 2   furosemide (LASIX) 20 MG tablet Take 1 tablet (20 mg total) by mouth daily as needed. 15 tablet 0   Multiple Vitamins-Minerals (MULTIVITAMIN  WITH MINERALS) tablet Take 1 tablet by mouth daily.     pantoprazole (PROTONIX) 40 MG tablet TAKE ONE TABLET BY MOUTH ONCE A DAY 90 tablet 2   Sennosides-Docusate Sodium (SM STOOL SOFTENER/LAXATIVE PO) Take 8.6 mg by mouth daily.     traMADol (ULTRAM) 50 MG tablet TAKE ONE TABLET (50 MG TOTAL) BY MOUTH DAILY AS NEEDED. 30 tablet 0   No current facility-administered medications for this visit.    Allergies  Allergen Reactions   Penicillins Anaphylaxis and Other (See Comments)    From childhood; uncertain of reaction Has patient had a PCN reaction causing immediate rash, facial/tongue/throat swelling, SOB or lightheadedness with hypotension: Unk Has patient had a PCN reaction causing severe rash involving mucus membranes or skin necrosis: Unk Has patient had a PCN reaction  that required hospitalization: Unk Has patient had a PCN reaction occurring within the last 10 years: No If all of the above answers are "NO", then may proceed with Cephalosporin use.   Vancomycin Other (See Comments)    Red Mans' syndrome   Penicillins Other (See Comments)    Childhood   Vancomycin Other (See Comments)    Red man Syndrome    Family History  Problem Relation Age of Onset   Coronary artery disease Mother    Hyperlipidemia Mother    Hypertension Mother    Arthritis Mother    Coronary artery disease Father    Heart disease Paternal Grandmother    Alzheimer's disease Paternal Grandfather     Social History   Socioeconomic History   Marital status: Married    Spouse name: Ory Donisi   Number of children: 2   Years of education: 12   Highest education level: Not on file  Occupational History   Occupation: Acupuncturist: Engineer, civil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) [employer]  Tobacco Use   Smoking status: Never   Smokeless tobacco: Never  Vaping Use   Vaping status: Never Used  Substance and Sexual Activity   Alcohol use: Not Currently    Comment: \   Drug use: No   Sexual activity: Yes  Other Topics Concern   Not on file  Social History Narrative   ** Merged History Encounter **       Social Determinants of Health   Financial Resource Strain: Low Risk  (08/08/2022)   Overall Financial Resource Strain (CARDIA)    Difficulty of Paying Living Expenses: Not hard at all  Food Insecurity: No Food Insecurity (08/08/2022)   Hunger Vital Sign    Worried About Running Out of Food in the Last Year: Never true    Ran Out of Food in the Last Year: Never true  Transportation Needs: No Transportation Needs (08/08/2022)   PRAPARE - Administrator, Civil Service (Medical): No    Lack of Transportation (Non-Medical): No  Physical Activity: Sufficiently Active (08/08/2022)   Exercise Vital Sign    Days of Exercise per Week: 7 days    Minutes of Exercise per Session: 40 min  Stress: No  Stress Concern Present (08/08/2022)   [employer] of Occupational Health - Occupational Stress Questionnaire    Feeling of Stress : Not at all  Social Connections: Moderately Integrated (08/08/2022)   Social Connection and Isolation Panel [NHANES]    Frequency of Communication with Friends and Family: More than three times a week    Frequency of Social Gatherings with Friends and Family: More than three times a week    Attends Religious Services: 1 to  4 times per year    Active Member of Clubs or Organizations: No    Attends Banker Meetings: Never    Marital Status: Married  Catering manager Violence: Not At Risk (08/08/2022)   Humiliation, Afraid, Rape, and Kick questionnaire    Fear of Current or Ex-Partner: No    Emotionally Abused: No    Physically Abused: No    Sexually Abused: No     Constitutional: Patient reports frequent headaches.  Denies fever, malaise, fatigue, or abrupt weight changes.  HEENT: Patient reports intermittent vision loss.  Denies eye pain, eye redness, ear pain, ringing in the ears, wax buildup, runny nose, nasal congestion, bloody nose, or sore throat. Respiratory: Denies difficulty breathing, shortness of breath, cough or sputum production.   Cardiovascular: Denies chest pain, chest tightness, palpitations or swelling in the hands or feet.  Gastrointestinal: Denies abdominal pain, bloating, constipation, diarrhea or blood in the stool.  GU: Denies urgency, frequency, pain with urination, burning sensation, blood in urine, odor or discharge. Musculoskeletal: Patient reports joint pain.  Denies decrease in range of motion, difficulty with gait, muscle pain or joint swelling.  Skin: Denies redness, rashes, lesions or ulcercations.  Neurological: Patient ports intermittent dizziness.  Denies difficulty with memory, difficulty with speech or problems with balance and coordination.  Psych: Denies anxiety, depression, SI/HI.  No other specific  complaints in a complete review of systems (except as listed in HPI above).     Objective:   Physical Exam   BP 106/82   Pulse (!) 54   Ht 5\' 9"  (1.753 m)   Wt 245 lb (111.1 kg)   SpO2 97%   BMI 36.18 kg/m   Wt Readings from Last 3 Encounters:  09/17/22 240 lb (108.9 kg)  08/08/22 226 lb (102.5 kg)  07/29/22 243 lb (110.2 kg)    General: Appears his stated age, obese, in NAD. Skin: Warm, dry and intact.  HEENT: Head: normal shape and size; Eyes: sclera white, no icterus, conjunctiva pink, PERRLA and EOMs intact;  Cardiovascular: Bradycardic with normal rhythm. S1,S2 noted.  No murmur, rubs or gallops noted. No JVD or BLE edema. No carotid bruits noted. Pulmonary/Chest: Normal effort and positive vesicular breath sounds. No respiratory distress. No wheezes, rales or ronchi noted.  Abdomen: Soft and nontender. Normal bowel sounds.  Musculoskeletal:  No difficulty with gait.  Neurological: Alert and oriented. Coordination normal.  Psychiatric: Mood and affect normal. Behavior is normal. Judgment and thought content normal.     BMET    Component Value Date/Time   NA 141 05/09/2022 0955   NA 145 (H) 02/05/2021 0909   K 4.0 05/09/2022 0955   CL 106 05/09/2022 0955   CO2 27 05/09/2022 0955   GLUCOSE 83 05/09/2022 0955   BUN 18 05/09/2022 0955   BUN 16 02/05/2021 0909   CREATININE 1.30 05/09/2022 0955   CALCIUM 9.6 05/09/2022 0955   GFRNONAA >60 12/26/2019 0123   GFRAA >60 10/19/2019 1337   GFRAA >60 10/19/2019 1337    Lipid Panel     Component Value Date/Time   CHOL 162 05/09/2022 0955   CHOL 143 02/05/2021 0909   TRIG 82 05/09/2022 0955   HDL 47 05/09/2022 0955   HDL 48 02/05/2021 0909   CHOLHDL 3.4 05/09/2022 0955   VLDL 22.4 01/17/2020 1214   LDLCALC 98 05/09/2022 0955    CBC    Component Value Date/Time   WBC 6.0 05/09/2022 0955   RBC 5.10 05/09/2022 0955  HGB 15.8 05/09/2022 0955   HGB 16.2 02/05/2021 0909   HCT 46.2 05/09/2022 0955   HCT 45.5  02/05/2021 0909   PLT 182 05/09/2022 0955   PLT 195 02/05/2021 0909   MCV 90.6 05/09/2022 0955   MCV 88 02/05/2021 0909   MCH 31.0 05/09/2022 0955   MCHC 34.2 05/09/2022 0955   RDW 12.5 05/09/2022 0955   RDW 12.6 02/05/2021 0909   LYMPHSABS 1.2 10/27/2019 1133   LYMPHSABS 1.6 10/31/2017 1438   MONOABS 1.0 10/27/2019 1133   EOSABS 0.0 10/27/2019 1133   EOSABS 0.2 10/31/2017 1438   BASOSABS 0.0 10/27/2019 1133   BASOSABS 0.0 10/31/2017 1438    Hgb A1C Lab Results  Component Value Date   HGBA1C 5.2 05/09/2022           Assessment & Plan:     RTC in 6 months for annual exam Nicki Reaper, NP

## 2022-11-09 LAB — COMPLETE METABOLIC PANEL WITH GFR
AG Ratio: 1.7 (calc) (ref 1.0–2.5)
ALT: 41 U/L (ref 9–46)
AST: 30 U/L (ref 10–35)
Albumin: 4.1 g/dL (ref 3.6–5.1)
Alkaline phosphatase (APISO): 70 U/L (ref 35–144)
BUN: 21 mg/dL (ref 7–25)
CO2: 30 mmol/L (ref 20–32)
Calcium: 9.3 mg/dL (ref 8.6–10.3)
Chloride: 105 mmol/L (ref 98–110)
Creat: 1.32 mg/dL (ref 0.70–1.35)
Globulin: 2.4 g/dL (ref 1.9–3.7)
Glucose, Bld: 80 mg/dL (ref 65–99)
Potassium: 4.4 mmol/L (ref 3.5–5.3)
Sodium: 142 mmol/L (ref 135–146)
Total Bilirubin: 0.7 mg/dL (ref 0.2–1.2)
Total Protein: 6.5 g/dL (ref 6.1–8.1)
eGFR: 61 mL/min/{1.73_m2} (ref >=60)

## 2022-11-09 LAB — LIPID PANEL
Cholesterol: 157 mg/dL (ref <200)
HDL: 47 mg/dL (ref >=40)
LDL Cholesterol (Calc): 94 mg/dL
Non-HDL Cholesterol (Calc): 110 mg/dL (ref <130)
Total CHOL/HDL Ratio: 3.3 (calc) (ref <5.0)
Triglycerides: 71 mg/dL (ref <150)

## 2022-11-09 LAB — HEMOGLOBIN A1C
Hgb A1c MFr Bld: 5.1 %{Hb} (ref <5.7)
Mean Plasma Glucose: 100 mg/dL
eAG (mmol/L): 5.5 mmol/L

## 2022-11-09 LAB — CBC
HCT: 45.3 % (ref 38.5–50.0)
Hemoglobin: 15.2 g/dL (ref 13.2–17.1)
MCH: 31.7 pg (ref 27.0–33.0)
MCHC: 33.6 g/dL (ref 32.0–36.0)
MCV: 94.6 fL (ref 80.0–100.0)
MPV: 11.3 fL (ref 7.5–12.5)
Platelets: 180 10*3/uL (ref 140–400)
RBC: 4.79 10*6/uL (ref 4.20–5.80)
RDW: 12.9 % (ref 11.0–15.0)
WBC: 5.8 10*3/uL (ref 3.8–10.8)

## 2022-11-09 LAB — URIC ACID: Uric Acid, Serum: 7.2 mg/dL (ref 4.0–8.0)

## 2022-11-11 DIAGNOSIS — R42 Dizziness and giddiness: Secondary | ICD-10-CM | POA: Diagnosis not present

## 2022-11-18 ENCOUNTER — Ambulatory Visit: Payer: Self-pay | Admitting: *Deleted

## 2022-11-18 NOTE — Telephone Encounter (Signed)
It is not likely given the holidays that I can have him seen anywhere prior to December 17.  I would recommend that he call Dr. Daisy Blossom office at [facility] neurology and asked to be worked in sooner.

## 2022-11-18 NOTE — Telephone Encounter (Signed)
Chief Complaint: blurred vision continues requesting appt or another neuro referral before Dec. 17 Symptoms: blurred vision, lightheaded, feels "nervous" all of the time. Feels like drunk, "not feeling well" Frequency: on going for "a while"  Pertinent Negatives: Patient denies chest pain no difficulty breathing no weakness N/T on either side of body.  Disposition: [] ED /[] Urgent Care (no appt availability in office) / [x] Appointment(In office/virtual)/ []  [facility]/ [] Home Care/ [] Refused Recommended Disposition /[] [facility]/ []  Follow-up with PCP Additional Notes:   Appt scheduled for tomorrow. Recommended if sx worsen go to ED or call 911. Patient reports he was told by ENT - abnormal "spots on brain" from MRI. Requesting  Earlier appt or another referral with neuro . Please advise.    Summary: blurred vision   Pt has blurred vision and not feeling well  Best contact: [phone]            Reason for Disposition  [1] Headache AND [2] age > 50 years  Answer Assessment - Initial Assessment Questions 1. SYMPTOM: "What is the main symptom you are concerned about?" (e.g., weakness, numbness)     No N/T . Blurred vision , feels nervous all of the time, lightheaded 2. ONSET: "When did this start?" (minutes, hours, days; while sleeping)     Over weekend more worried  3. LAST NORMAL: "When was the last time you (the patient) were normal (no symptoms)?"     Has been seen for sx for "a while" 4. PATTERN "Does this come and go, or has it been constant since it started?"  "Is it present now?"     Constant  5. CARDIAC SYMPTOMS: "Have you had any of the following symptoms: chest pain, difficulty breathing, palpitations?"     Denies chest pain no difficulty breathing  6. NEUROLOGIC SYMPTOMS: "Have you had any of the following symptoms: headache, dizziness, vision loss, double vision, changes in speech, unsteady on your feet?"     Mild headache, dizziness,  blurred vision, feels like drunk. Has been seen by ENT  7. OTHER SYMPTOMS: "Do you have any other symptoms?"     Hx a fib, HTN 8. PREGNANCY: "Is there any chance you are pregnant?" "When was your last menstrual period?"     na  Answer Assessment - Initial Assessment Questions 1. DESCRIPTION: "How has your vision changed?" (e.g., complete vision loss, blurred vision, double vision, floaters, etc.)     Blurred vision  2. LOCATION: "One or both eyes?" If one, ask: "Which eye?"     Na  3. SEVERITY: "Can you see anything?" If Yes, ask: "What can you see?" (e.g., fine print)     yes 4. ONSET: "When did this begin?" "Did it start suddenly or has this been gradual?"     "A while ago" has seen ENT, ophthalmologist.  5. PATTERN: "Does this come and go, or has it been constant since it started?"     More constant  6. PAIN: "Is there any pain in your eye(s)?"  (Scale 1-10; or mild, moderate, severe)   - NONE (0): No pain.   - MILD (1-3): Doesn't interfere with normal activities.   - MODERATE (4-7): Interferes with normal activities or awakens from sleep.    - SEVERE (8-10): Excruciating pain, unable to do any normal activities.     na 7. CONTACTS-GLASSES: "Do you wear contacts or glasses?"     na 8. CAUSE: "What do you think is causing this visual problem?"  Not sure  9. OTHER SYMPTOMS: "Do you have any other symptoms?" (e.g., confusion, headache, arm or leg weakness, speech problems)     Mild headaches. Feels nervous all of the time. Feels like drunk, blurred vision 10. PREGNANCY: "Is there any chance you are pregnant?" "When was your last menstrual period?"       na  Protocols used: Vision Loss or Change-A-AH, Neurologic Deficit-A-AH

## 2022-11-19 ENCOUNTER — Ambulatory Visit (INDEPENDENT_AMBULATORY_CARE_PROVIDER_SITE_OTHER): Payer: Medicare Other | Admitting: Internal Medicine

## 2022-11-19 ENCOUNTER — Encounter: Payer: Self-pay | Admitting: Internal Medicine

## 2022-11-19 VITALS — BP 130/78 | Ht 69.0 in | Wt 245.0 lb

## 2022-11-19 DIAGNOSIS — R42 Dizziness and giddiness: Secondary | ICD-10-CM | POA: Diagnosis not present

## 2022-11-19 DIAGNOSIS — R519 Headache, unspecified: Secondary | ICD-10-CM | POA: Diagnosis not present

## 2022-11-19 DIAGNOSIS — H53133 Sudden visual loss, bilateral: Secondary | ICD-10-CM

## 2022-11-19 NOTE — Progress Notes (Signed)
Subjective:    Patient ID: Richard [name], male    DOB: [birth year], 61 y.o.   MRN: [id number]  HPI  Discussed the use of AI scribe software for clinical note transcription with the patient, who gave verbal consent to proceed.  The patient presents with recurrent episodes of headache, lightheadedness, and transient bilateral vision loss since May. The episodes, which last approximately 3-4 minutes, are characterized by a total loss of vision that resolves spontaneously. The patient reports experiencing about 10-12 of these episodes since onset, with no identifiable triggers. The episodes occur in various settings, including while driving and watching television.  The patient underwent an MRI in June, which was reportedly normal. He was subsequently evaluated by an ophthalmologist who performed a bilateral iridotomy, but this did not result in any improvement in symptoms.   The patient was also evaluated by an ENT specialist for potential vertigo. The ENT performed hearing tests and other unspecified tests. The patient reports being told by the ENT specialist that his optokinetic's were abnormal and requested that he had an additional MRI.  The patient is scheduled to see a neurologist in December, but is concerned about the delay in evaluation.      Review of Systems     Past Medical History:  Diagnosis Date   Arthritis    Arthrofibrosis of total knee arthroplasty (HCC) 05/30/2015   Bilateral pulmonary embolism (HCC)    a. 03/2015 CTA: acute bilat PE; b. IVC filter placed in 2017--> removed 2018.   Chest pain    a. 2015 Abnl MV;  b. 2015 Cath: nl cors.   Chronic diastolic CHF (congestive heart failure) (HCC)    a. 03/2015 Echo: EF 55-65%, poor windows, mildly dil RV.   DVT (deep venous thrombosis) (HCC)    a. 03/2015 U/S: occlusive DVT w/in the distal aspect of the Left fem vein through the L popliteal vein.   Dyspnea    On exertion   GERD (gastroesophageal reflux disease)     Headache    Hypertension    Hypertensive heart disease    Obesity    OSA (obstructive sleep apnea)    uses CPAP nightly settings at 15    PAF (paroxysmal atrial fibrillation) (HCC)    Pneumonia    hx of years ago    Primary localized osteoarthritis of left knee    a. 11/2014 s/p L TKA.   Primary localized osteoarthritis of right knee    a. 02/2014 s/p R Partial Knee arthroplasty.   Stiffness of left knee    a. 12/2014 s/p manipulation under anesthesia.   Urine incontinence     Current Outpatient Medications  Medication Sig Dispense Refill   allopurinol (ZYLOPRIM) 100 MG tablet TAKE ONE TABLET (100 MG TOTAL) BY MOUTH DAILY. 90 tablet 0   baclofen (LIORESAL) 10 MG tablet Take 1 tablet (10 mg total) by mouth 2 (two) times daily. 180 each 1   colchicine 0.6 MG tablet TAKE ONE TABLET BY MOUTH TWICE A DAY 180 tablet 2   ELIQUIS 5 MG TABS tablet TAKE ONE TABLET BY MOUTH TWO TIMES DAILY 180 tablet 1   flecainide (TAMBOCOR) 100 MG tablet Take (1) one tablet by mouth (2) twice daily 180 tablet 2   furosemide (LASIX) 20 MG tablet Take 1 tablet (20 mg total) by mouth daily as needed. 15 tablet 0   Multiple Vitamins-Minerals (MULTIVITAMIN WITH MINERALS) tablet Take 1 tablet by mouth daily.     pantoprazole (PROTONIX) 40 MG tablet  TAKE ONE TABLET BY MOUTH ONCE A DAY 90 tablet 2   Sennosides-Docusate Sodium (SM STOOL SOFTENER/LAXATIVE PO) Take 8.6 mg by mouth daily.     traMADol (ULTRAM) 50 MG tablet TAKE ONE TABLET (50 MG TOTAL) BY MOUTH DAILY AS NEEDED. 30 tablet 0   No current facility-administered medications for this visit.    Allergies  Allergen Reactions   Penicillins Anaphylaxis and Other (See Comments)    From childhood; uncertain of reaction Has patient had a PCN reaction causing immediate rash, facial/tongue/throat swelling, SOB or lightheadedness with hypotension: Unk Has patient had a PCN reaction causing severe rash involving mucus membranes or skin necrosis: Unk Has patient had  a PCN reaction that required hospitalization: Unk Has patient had a PCN reaction occurring within the last 10 years: No If all of the above answers are "NO", then may proceed with Cephalosporin use.   Vancomycin Other (See Comments)    Red Mans' syndrome   Penicillins Other (See Comments)    Childhood   Vancomycin Other (See Comments)    Red man Syndrome    Family History  Problem Relation Age of Onset   Coronary artery disease Mother    Hyperlipidemia Mother    Hypertension Mother    Arthritis Mother    Coronary artery disease Father    Heart disease Paternal Grandmother    Alzheimer's disease Paternal Grandfather     Social History   Socioeconomic History   Marital status: Married    Spouse name: Kentravion Riedel   Number of children: 2   Years of education: 12   Highest education level: Not on file  Occupational History   Occupation: Acupuncturist: Engineer, civil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) [employer]  Tobacco Use   Smoking status: Never   Smokeless tobacco: Never  Vaping Use   Vaping status: Never Used  Substance and Sexual Activity   Alcohol use: Not Currently    Comment: \   Drug use: No   Sexual activity: Yes  Other Topics Concern   Not on file  Social History Narrative   ** Merged History Encounter **       Social Determinants of Health   Financial Resource Strain: Low Risk  (08/08/2022)   Overall Financial Resource Strain (CARDIA)    Difficulty of Paying Living Expenses: Not hard at all  Food Insecurity: No Food Insecurity (08/08/2022)   Hunger Vital Sign    Worried About Running Out of Food in the Last Year: Never true    Ran Out of Food in the Last Year: Never true  Transportation Needs: No Transportation Needs (08/08/2022)   PRAPARE - Administrator, Civil Service (Medical): No    Lack of Transportation (Non-Medical): No  Physical Activity: Sufficiently Active (08/08/2022)   Exercise Vital Sign    Days of Exercise per Week: 7 days    Minutes of Exercise per Session: 40 min   Stress: No Stress Concern Present (08/08/2022)   [employer] of Occupational Health - Occupational Stress Questionnaire    Feeling of Stress : Not at all  Social Connections: Moderately Integrated (08/08/2022)   Social Connection and Isolation Panel [NHANES]    Frequency of Communication with Friends and Family: More than three times a week    Frequency of Social Gatherings with Friends and Family: More than three times a week    Attends Religious Services: 1 to 4 times per year    Active Member of Clubs or Organizations: No    Attends  Club or Organization Meetings: Never    Marital Status: Married  Catering manager Violence: Not At Risk (08/08/2022)   Humiliation, Afraid, Rape, and Kick questionnaire    Fear of Current or Ex-Partner: No    Emotionally Abused: No    Physically Abused: No    Sexually Abused: No     Constitutional: Patient reports intermittent headaches.  Denies fever, malaise, fatigue, or abrupt weight changes.  HEENT: Patient reports vision loss.  Denies eye pain, eye redness, ear pain, ringing in the ears, wax buildup, runny nose, nasal congestion, bloody nose, or sore throat. Respiratory: Denies difficulty breathing, shortness of breath, cough or sputum production.   Cardiovascular: Denies chest pain, chest tightness, palpitations or swelling in the hands or feet.  Gastrointestinal: Denies abdominal pain, bloating, constipation, diarrhea or blood in the stool.  GU: Denies urgency, frequency, pain with urination, burning sensation, blood in urine, odor or discharge. Musculoskeletal: Patient reports joint pain.  Denies decrease in range of motion, difficulty with gait, muscle pain or joint swelling.  Skin: Denies redness, rashes, lesions or ulcercations.  Neurological: Patient reports intermittent dizziness.  Denies difficulty with memory, difficulty with speech or problems with balance and coordination.  Psych: Denies anxiety, depression, SI/HI.  No other  specific complaints in a complete review of systems (except as listed in HPI above).  Objective:   Physical Exam  BP 130/78   Ht 5\' 9"  (1.753 m)   Wt 245 lb (111.1 kg)   BMI 36.18 kg/m   Wt Readings from Last 3 Encounters:  11/08/22 245 lb (111.1 kg)  09/17/22 240 lb (108.9 kg)  08/08/22 226 lb (102.5 kg)    General: Appears his stated age, obese, in NAD. HEENT: Head: normal shape and size; Eyes: sclera white, no icterus, conjunctiva pink, PERRLA and EOMs intact; Ears: Tm's gray and intact, normal light reflex;  Cardiovascular: Normal rate and rhythm. S1,S2 noted.  No murmur, rubs or gallops noted. No JVD or BLE edema. No carotid bruits noted. Pulmonary/Chest: Normal effort and positive vesicular breath sounds. No respiratory distress. No wheezes, rales or ronchi noted.  Musculoskeletal: Normal flexion, extension, rotation and lateral bending of the cervical spine.  Strength 5/5 BUE/BLE.  No difficulty with gait.  Neurological: Alert and oriented. Cranial nerves II-XII grossly intact. Coordination normal.    BMET    Component Value Date/Time   NA 142 11/08/2022 0948   NA 145 (H) 02/05/2021 0909   K 4.4 11/08/2022 0948   CL 105 11/08/2022 0948   CO2 30 11/08/2022 0948   GLUCOSE 80 11/08/2022 0948   BUN 21 11/08/2022 0948   BUN 16 02/05/2021 0909   CREATININE 1.32 11/08/2022 0948   CALCIUM 9.3 11/08/2022 0948   GFRNONAA >60 12/26/2019 0123   GFRAA >60 10/19/2019 1337   GFRAA >60 10/19/2019 1337    Lipid Panel     Component Value Date/Time   CHOL 157 11/08/2022 0948   CHOL 143 02/05/2021 0909   TRIG 71 11/08/2022 0948   HDL 47 11/08/2022 0948   HDL 48 02/05/2021 0909   CHOLHDL 3.3 11/08/2022 0948   VLDL 22.4 01/17/2020 1214   LDLCALC 94 11/08/2022 0948    CBC    Component Value Date/Time   WBC 5.8 11/08/2022 0948   RBC 4.79 11/08/2022 0948   HGB 15.2 11/08/2022 0948   HGB 16.2 02/05/2021 0909   HCT 45.3 11/08/2022 0948   HCT 45.5 02/05/2021 0909   PLT 180  11/08/2022 0948   PLT 195  02/05/2021 0909   MCV 94.6 11/08/2022 0948   MCV 88 02/05/2021 0909   MCH 31.7 11/08/2022 0948   MCHC 33.6 11/08/2022 0948   RDW 12.9 11/08/2022 0948   RDW 12.6 02/05/2021 0909   LYMPHSABS 1.2 10/27/2019 1133   LYMPHSABS 1.6 10/31/2017 1438   MONOABS 1.0 10/27/2019 1133   EOSABS 0.0 10/27/2019 1133   EOSABS 0.2 10/31/2017 1438   BASOSABS 0.0 10/27/2019 1133   BASOSABS 0.0 10/31/2017 1438    Hgb A1C Lab Results  Component Value Date   HGBA1C 5.1 11/08/2022           Assessment & Plan:   Assessment and Plan    Transient Vision Loss, Headaches, Dizziness Episodes of transient bilateral vision loss since May, lasting 3-4 minutes, with a total of 10-12 episodes. No clear triggers identified. Initial MRI in June was normal.  No improvement status post bilateral iridotomy by ophthalmology.  ENT testing was positive for abnormal optic otic kinetics. Neurology consult scheduled but not until December. -Obtain records from [facility] audiology clinic and eye doctor for review. -Discussed patient case with Dr. Althea Charon, recommend MRA head for further evaluation -Contact neurology to attempt to expedite appointment.    RTC in 6 months for your annual exam Nicki Reaper, NP

## 2022-11-20 ENCOUNTER — Other Ambulatory Visit: Payer: Self-pay | Admitting: Internal Medicine

## 2022-11-20 ENCOUNTER — Encounter: Payer: Self-pay | Admitting: Internal Medicine

## 2022-11-20 ENCOUNTER — Ambulatory Visit
Admission: RE | Admit: 2022-11-20 | Discharge: 2022-11-20 | Disposition: A | Discharge status: Home / Self Care | Payer: Medicare Other | Source: Ambulatory Visit | Attending: Internal Medicine | Admitting: Internal Medicine

## 2022-11-20 DIAGNOSIS — H53133 Sudden visual loss, bilateral: Secondary | ICD-10-CM | POA: Insufficient documentation

## 2022-11-20 DIAGNOSIS — R519 Headache, unspecified: Secondary | ICD-10-CM | POA: Insufficient documentation

## 2022-11-20 DIAGNOSIS — R42 Dizziness and giddiness: Secondary | ICD-10-CM | POA: Insufficient documentation

## 2022-11-20 DIAGNOSIS — H547 Unspecified visual loss: Secondary | ICD-10-CM | POA: Diagnosis not present

## 2022-11-21 ENCOUNTER — Telehealth: Payer: Self-pay | Admitting: Internal Medicine

## 2022-11-21 MED ORDER — TOPIRAMATE 25 MG PO TABS
25.0000 mg | ORAL_TABLET | Freq: Every day | ORAL | 0 refills | Status: DC
Start: 2022-11-21 — End: 2023-02-19

## 2022-11-21 NOTE — Telephone Encounter (Signed)
Pt. Called to get message from PCP, call disconnected. Called pt. And left message to call back.

## 2022-11-21 NOTE — Addendum Note (Signed)
Addended by: Lorre Munroe on: 11/21/2022 02:40 PM   Modules accepted: Orders

## 2022-11-21 NOTE — Telephone Encounter (Signed)
Requested Prescriptions  Pending Prescriptions Disp Refills   furosemide (LASIX) 20 MG tablet [Pharmacy Med Name: FUROSEMIDE 20MG  TABLET] 90 tablet 0    Sig: TAKE ONE TABLET (20 MG TOTAL) BY MOUTH DAILY AS NEEDED.     Cardiovascular:  Diuretics - Loop Failed - 11/20/2022  4:01 PM      Failed - Mg Level in normal range and within 180 days    Magnesium  Date Value Ref Range Status  12/29/2016 1.7 1.7 - 2.4 mg/dL Final         Passed - K in normal range and within 180 days    Potassium  Date Value Ref Range Status  11/08/2022 4.4 3.5 - 5.3 mmol/L Final         Passed - Ca in normal range and within 180 days    Calcium  Date Value Ref Range Status  11/08/2022 9.3 8.6 - 10.3 mg/dL Final   Calcium, Ion  Date Value Ref Range Status  02/25/2017 1.22 1.15 - 1.40 mmol/L Final         Passed - Na in normal range and within 180 days    Sodium  Date Value Ref Range Status  11/08/2022 142 135 - 146 mmol/L Final  02/05/2021 145 (H) 134 - 144 mmol/L Final         Passed - Cr in normal range and within 180 days    Creat  Date Value Ref Range Status  11/08/2022 1.32 0.70 - 1.35 mg/dL Final         Passed - Cl in normal range and within 180 days    Chloride  Date Value Ref Range Status  11/08/2022 105 98 - 110 mmol/L Final         Passed - Last BP in normal range    BP Readings from Last 1 Encounters:  11/19/22 130/78         Passed - Valid encounter within last 6 months    Recent Outpatient Visits           2 days ago Acute intractable headache, unspecified headache type   [facility] [place], Salvadore Oxford, NP   1 week ago 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

## 2022-12-10 ENCOUNTER — Observation Stay: Payer: Medicare Other

## 2022-12-10 ENCOUNTER — Observation Stay
Admission: EM | Admit: 2022-12-10 | Discharge: 2022-12-12 | Disposition: A | Discharge status: Home / Self Care | Payer: Medicare Other | Attending: Internal Medicine | Admitting: Internal Medicine

## 2022-12-10 ENCOUNTER — Other Ambulatory Visit: Payer: Self-pay

## 2022-12-10 ENCOUNTER — Emergency Department: Payer: Medicare Other

## 2022-12-10 ENCOUNTER — Ambulatory Visit: Payer: Self-pay

## 2022-12-10 ENCOUNTER — Telehealth: Payer: Self-pay | Admitting: Internal Medicine

## 2022-12-10 DIAGNOSIS — I5032 Chronic diastolic (congestive) heart failure: Secondary | ICD-10-CM | POA: Diagnosis not present

## 2022-12-10 DIAGNOSIS — Z7901 Long term (current) use of anticoagulants: Secondary | ICD-10-CM | POA: Insufficient documentation

## 2022-12-10 DIAGNOSIS — H538 Other visual disturbances: Secondary | ICD-10-CM | POA: Diagnosis not present

## 2022-12-10 DIAGNOSIS — H532 Diplopia: Secondary | ICD-10-CM | POA: Diagnosis not present

## 2022-12-10 DIAGNOSIS — Z96653 Presence of artificial knee joint, bilateral: Secondary | ICD-10-CM | POA: Diagnosis not present

## 2022-12-10 DIAGNOSIS — R42 Dizziness and giddiness: Secondary | ICD-10-CM | POA: Insufficient documentation

## 2022-12-10 DIAGNOSIS — Z79899 Other long term (current) drug therapy: Secondary | ICD-10-CM | POA: Insufficient documentation

## 2022-12-10 DIAGNOSIS — Z8673 Personal history of transient ischemic attack (TIA), and cerebral infarction without residual deficits: Secondary | ICD-10-CM | POA: Diagnosis present

## 2022-12-10 DIAGNOSIS — G459 Transient cerebral ischemic attack, unspecified: Secondary | ICD-10-CM | POA: Diagnosis not present

## 2022-12-10 DIAGNOSIS — Z86718 Personal history of other venous thrombosis and embolism: Secondary | ICD-10-CM | POA: Diagnosis not present

## 2022-12-10 DIAGNOSIS — Z86711 Personal history of pulmonary embolism: Secondary | ICD-10-CM | POA: Insufficient documentation

## 2022-12-10 DIAGNOSIS — R29818 Other symptoms and signs involving the nervous system: Secondary | ICD-10-CM | POA: Diagnosis not present

## 2022-12-10 DIAGNOSIS — I4891 Unspecified atrial fibrillation: Secondary | ICD-10-CM | POA: Insufficient documentation

## 2022-12-10 DIAGNOSIS — I480 Paroxysmal atrial fibrillation: Secondary | ICD-10-CM | POA: Diagnosis not present

## 2022-12-10 DIAGNOSIS — I110 Hypertensive heart disease with heart failure: Secondary | ICD-10-CM | POA: Insufficient documentation

## 2022-12-10 LAB — TROPONIN I (HIGH SENSITIVITY)
Troponin I (High Sensitivity): 10 ng/L (ref <18)
Troponin I (High Sensitivity): 12 ng/L (ref <18)

## 2022-12-10 LAB — CBC
HCT: 46.4 % (ref 39.0–52.0)
Hemoglobin: 16.2 g/dL (ref 13.0–17.0)
MCH: 31.6 pg (ref 26.0–34.0)
MCHC: 34.9 g/dL (ref 30.0–36.0)
MCV: 90.4 fL (ref 80.0–100.0)
Platelets: 163 10*3/uL (ref 150–400)
RBC: 5.13 MIL/uL (ref 4.22–5.81)
RDW: 12.9 % (ref 11.5–15.5)
WBC: 10.1 10*3/uL (ref 4.0–10.5)
nRBC: 0 % (ref 0.0–0.2)

## 2022-12-10 LAB — BASIC METABOLIC PANEL
Anion gap: 7 (ref 5–15)
BUN: 20 mg/dL (ref 8–23)
CO2: 24 mmol/L (ref 22–32)
Calcium: 8.6 mg/dL — ABNORMAL LOW (ref 8.9–10.3)
Chloride: 107 mmol/L (ref 98–111)
Creatinine, Ser: 1.27 mg/dL — ABNORMAL HIGH (ref 0.61–1.24)
GFR, Estimated: >60 mL/min (ref >60)
Glucose, Bld: 125 mg/dL — ABNORMAL HIGH (ref 70–99)
Potassium: 3.5 mmol/L (ref 3.5–5.1)
Sodium: 138 mmol/L (ref 135–145)

## 2022-12-10 MED ORDER — BACLOFEN 10 MG PO TABS
10.0000 mg | ORAL_TABLET | Freq: Two times a day (BID) | ORAL | Status: DC
  Filled 2022-12-10: qty 1

## 2022-12-10 MED ORDER — TRAMADOL HCL 50 MG PO TABS
50.0000 mg | ORAL_TABLET | Freq: Four times a day (QID) | ORAL | Status: DC | PRN
  Administered 2022-12-11 – 2022-12-12 (×4): 50 mg via ORAL
  Filled 2022-12-10 (×4): qty 1

## 2022-12-10 MED ORDER — METOCLOPRAMIDE HCL 5 MG/ML IJ SOLN
10.0000 mg | Freq: Once | INTRAMUSCULAR | Status: AC
  Administered 2022-12-10: 10 mg via INTRAVENOUS
  Filled 2022-12-10: qty 2

## 2022-12-10 MED ORDER — DIPHENHYDRAMINE HCL 50 MG/ML IJ SOLN
12.5000 mg | Freq: Once | INTRAMUSCULAR | Status: AC
  Administered 2022-12-10: 12.5 mg via INTRAVENOUS
  Filled 2022-12-10: qty 1

## 2022-12-10 MED ORDER — SENNOSIDES-DOCUSATE SODIUM 8.6-50 MG PO TABS
1.0000 | ORAL_TABLET | Freq: Two times a day (BID) | ORAL | Status: DC
  Filled 2022-12-10 (×3): qty 1

## 2022-12-10 MED ORDER — FLECAINIDE ACETATE 100 MG PO TABS
100.0000 mg | ORAL_TABLET | Freq: Two times a day (BID) | ORAL | Status: DC
  Administered 2022-12-10 – 2022-12-12 (×4): 100 mg via ORAL
  Filled 2022-12-10 (×5): qty 1

## 2022-12-10 MED ORDER — APIXABAN 5 MG PO TABS
5.0000 mg | ORAL_TABLET | Freq: Two times a day (BID) | ORAL | Status: DC
  Administered 2022-12-10 – 2022-12-12 (×4): 5 mg via ORAL
  Filled 2022-12-10 (×4): qty 1

## 2022-12-10 MED ORDER — FUROSEMIDE 20 MG PO TABS
20.0000 mg | ORAL_TABLET | Freq: Every day | ORAL | Status: DC
  Administered 2022-12-11: 20 mg via ORAL
  Filled 2022-12-10 (×2): qty 1

## 2022-12-10 MED ORDER — COLCHICINE 0.6 MG PO TABS
0.6000 mg | ORAL_TABLET | Freq: Two times a day (BID) | ORAL | Status: DC
  Administered 2022-12-10 – 2022-12-12 (×4): 0.6 mg via ORAL
  Filled 2022-12-10 (×5): qty 1

## 2022-12-10 MED ORDER — TOPIRAMATE 25 MG PO TABS
25.0000 mg | ORAL_TABLET | Freq: Every day | ORAL | Status: DC
  Administered 2022-12-11: 25 mg via ORAL
  Filled 2022-12-10 (×2): qty 1

## 2022-12-10 MED ORDER — PANTOPRAZOLE SODIUM 40 MG PO TBEC
40.0000 mg | DELAYED_RELEASE_TABLET | Freq: Every day | ORAL | Status: DC
  Administered 2022-12-11 – 2022-12-12 (×2): 40 mg via ORAL
  Filled 2022-12-10 (×2): qty 1

## 2022-12-10 MED ORDER — ADULT MULTIVITAMIN W/MINERALS CH
1.0000 | ORAL_TABLET | Freq: Every day | ORAL | Status: DC
  Administered 2022-12-11 – 2022-12-12 (×2): 1 via ORAL
  Filled 2022-12-10 (×2): qty 1

## 2022-12-10 MED ORDER — ALLOPURINOL 100 MG PO TABS
100.0000 mg | ORAL_TABLET | Freq: Every day | ORAL | Status: DC
  Administered 2022-12-11 – 2022-12-12 (×2): 100 mg via ORAL
  Filled 2022-12-10 (×2): qty 1

## 2022-12-10 NOTE — ED Notes (Signed)
EDP informed of pt HA and request for pain med.

## 2022-12-10 NOTE — Telephone Encounter (Signed)
The only recommendation I can give him is to go to the ED for evaluation or call the neurology clinic and see if he can be seen as soon as possible

## 2022-12-10 NOTE — Plan of Care (Signed)

## 2022-12-10 NOTE — Telephone Encounter (Signed)
Chief Complaint: Vision Changes Symptoms: Double vision, loss of vision, unbalanced  Frequency: comes and goes  Pertinent Negatives: Patient denies eye pain, headaches, fever Disposition: [] ED /[] Urgent Care (no appt availability in office) / [] Appointment(In office/virtual)/ []  [facility]/ [] Home Care/ [x] Refused Recommended Disposition /[] [facility]/ []  Follow-up with PCP Additional Notes: Patient states he was driving today and experienced a change in vision again. Patient stated he had to pull over because he could not see at all and it lasted about 10 minutes this time. Patient states this has been happening for about a month now but the vision changes are happening more frequently. Patient states his appointment with neurology is next month but he can not wait that long.Patient requesting help to be see by neurology sooner. Care advice was given and recommended patient seek care at the ED. Patient stated the last time he went to the ED he waited over 20 hours and ended up leaving before he was seen. Patient requesting PCP to give recommendation on what he should do now.   Reason for Disposition  Complete loss of vision in one or both eyes  Answer Assessment - Initial Assessment Questions 1. DESCRIPTION: "How has your vision changed?" (e.g., complete vision loss, blurred vision, double vision, floaters, etc.)     Double vision 2. LOCATION: "One or both eyes?" If one, ask: "Which eye?"     Bilateral 3. SEVERITY: "Can you see anything?" If Yes, ask: "What can you see?" (e.g., fine print)     No  4. ONSET: "When did this begin?" "Did it start suddenly or has this been gradual?"     2 months ago  5. PATTERN: "Does this come and go, or has it been constant since it started?"     Comes and goes 6. PAIN: "Is there any pain in your eye(s)?"  (Scale 1-10; or mild, moderate, severe)   - NONE (0): No pain.   - MILD (1-3): Doesn't interfere with normal activities.   -  MODERATE (4-7): Interferes with normal activities or awakens from sleep.    - SEVERE (8-10): Excruciating pain, unable to do any normal activities.     0/10 7. CONTACTS-GLASSES: "Do you wear contacts or glasses?"     Glasses 8. CAUSE: "What do you think is causing this visual problem?"     Unsure  9. OTHER SYMPTOMS: "Do you have any other symptoms?" (e.g., confusion, headache, arm or leg weakness, speech problems)     My eye motion changes  Protocols used: Vision Loss or Change-A-AH

## 2022-12-10 NOTE — ED Notes (Signed)
EDP at bedside examining pt. Pt reports intermittent double vision to both eyes since 2 months. Has seen ENT. Pending neurologist appt in mid December. Denies other sx such as HA, CP, SOB. Pt at this moment is seeing clearly. Has also had stroke workup with MRIs.

## 2022-12-10 NOTE — ED Notes (Signed)
EDP at bedside again talking about plan of care.

## 2022-12-10 NOTE — Telephone Encounter (Signed)
Error

## 2022-12-10 NOTE — ED Triage Notes (Signed)
Pt here with blurred vision and balance issues x2 months. Pt has had 2 MRIs and other scans and an appt with neurology in Dec but told to come to the ED with his ongoing issues. Pt states the blurred vision comes on for about 6 mins and then resolves. Pt alert and oriented in triage.

## 2022-12-10 NOTE — ED Provider Notes (Signed)
[facility] Provider Note    Event Date/Time   First MD Initiated Contact with Patient 12/10/22 1503     (approximate)   History   Blurred Vision   HPI  Richard [name] is a 62 y.o. male with paroxysmal A-fib on Eliquis, prior DVT, PE who comes in with concerns for blurred vision.  Patient reports nearly daily episodes of blurred vision where he feels like the room is spinning, gets quadruple vision in bilateral eyes.  He states his episodes happen even while he is just sitting there.  He states it happened when he has been driving.  He denies any headaches associated with.  He was started on Topamax in case he is could be ocular migraines but this was negative.  Does not appear that he has had any imaging of his vessels.  I reviewed the note from his primary Dr. Nicki Reaper from 11/19/2022.  He reports that he is followed up with ophthalmology and ENT and both of them of thought that this was not related to them.  Has been try to follow-up with neurology outpatient but does not have a follow-up until late December. Symptom free at this time. Last episode was at 8am.    Physical Exam   Triage Vital Signs: ED Triage Vitals  Encounter Vitals Group     BP 12/10/22 1030 138/60     Systolic BP Percentile --      Diastolic BP Percentile --      Pulse Rate 12/10/22 1030 70     Resp 12/10/22 1030 16     Temp 12/10/22 1030 97.9 F (36.6 C)     Temp Source 12/10/22 1516 Oral     SpO2 12/10/22 1030 97 %     Weight 12/10/22 1033 244 lb 14.9 oz (111.1 kg)     Height 12/10/22 1033 5\' 9"  (1.753 m)     Head Circumference --      Peak Flow --      Pain Score 12/10/22 1033 0     Pain Loc --      Pain Education --      Exclude from Growth Chart --     Most recent vital signs: Vitals:   12/10/22 1513 12/10/22 1516  BP:    Pulse: 71   Resp:    Temp:  98 F (36.7 C)  SpO2: 94%      General: Awake, no distress.  CV:  Good peripheral perfusion.   Resp:  Normal effort.  Abd:  No distention.  Other:  Nihss zero   ED Results / Procedures / Treatments   Labs (all labs ordered are listed, but only abnormal results are displayed) Labs Reviewed  BASIC METABOLIC PANEL - Abnormal; Notable for the following components:      Result Value   Glucose, Bld 125 (*)    Creatinine, Ser 1.27 (*)    Calcium 8.6 (*)    All other components within normal limits  CBC  URINALYSIS, ROUTINE W REFLEX MICROSCOPIC     EKG  My interpretation of EKG:  Normal sinus rate of 71 without any ST elevation, type I AV block, left anterior fascicular block, no T wave inversions  RADIOLOGY I have reviewed the CT head personally interpreted no evidence of intracranial hemorrhage   PROCEDURES:  Critical Care performed: No  .1-3 Lead EKG Interpretation  Performed by: Concha Se, MD Authorized by: Concha Se, MD     Interpretation: normal  ECG rate:  60   ECG rate assessment: normal     Rhythm: sinus rhythm     Ectopy: none     Conduction: normal      MEDICATIONS ORDERED IN ED: Medications  metoCLOPramide (REGLAN) injection 10 mg (10 mg Intravenous Given 12/10/22 1651)  diphenhydrAMINE (BENADRYL) injection 12.5 mg (12.5 mg Intravenous Given 12/10/22 1651)     IMPRESSION / MDM / ASSESSMENT AND PLAN / ED COURSE  I reviewed the triage vital signs and the nursing notes.   Patient's presentation is most consistent with acute presentation with potential threat to life or bodily function.   Patient comes in with episodes of blurred vision that he describes as quadruple vision with dizziness.  He has had MRI and has been negative for stroke but not had any imaging of his neck vasculature.  NIH stroke scale is currently 0 therefore stroke code was not called.  CT head without evidence of intracranial hemorrhage blood work all reassuring.  Initial troponin was negative.  I discussed case with Dr. Selina Cooley and will admit patient for TIA workup.   I think he would benefit from vessel imaging of his neck, echocardiogram, repeat MRI.  Discussed this with the admitting hospitalist who is agreeable to the plan.  And Dr. Selina Cooley will see them tomorrow.  After admission patient did report having a headache.  He stated originally that he did not have any headaches with these episodes but then he stated to me that he did have a headache that started around 9 AM today.  Given his CT head was within 6 hours of onset of headache and doubt a subarachnoid hemorrhage that is being missed on the CT head.  He reports pain is better after migraine cocktail was given.  Patient could be having complex migraines if TIA workup was otherwise reassuring.   The patient is on the cardiac monitor to evaluate for evidence of arrhythmia and/or significant heart rate changes.      FINAL CLINICAL IMPRESSION(S) / ED DIAGNOSES   Final diagnoses:  Double vision  Dizziness     Rx / DC Orders   ED Discharge Orders     None        Note:  This document was prepared using Dragon voice recognition software and may include unintentional dictation errors.   Concha Se, MD 12/10/22 [phone]

## 2022-12-10 NOTE — H&P (Signed)
History and Physical    Patient: Richard [name] WNU:[id number] DOB: April 10, 1960 DOA: 12/10/2022 DOS: the patient was seen and examined on 12/10/2022 PCP: Lorre Munroe, NP  Patient coming from: Home  Chief Complaint: Blurry vision Chief Complaint  Patient presents with   Blurred Vision   HPI: Richard [name] is a 62 y.o. male with medical history significant of atrial fibrillation on Eliquis, history of DVT, pulmonary embolism, hypertension, obesity, hypertensive heart disease, GERD, OSA uses CPAP at night, chronic diastolic congestive heart failure who follows up with cardiologist presents today with 2 months history of blurry vision.  According to patient he has been having blurry vision that has been going on for the last 2 months usually starts off with oscillation of both eyes and vision becomes blurry with associated quadruple vision.  According to patient while sitting at home watching TV vision became blurry to the point that he saw four monitors of the TV screen.  He also has associated impairment of his imbalance during the time of the blurry vision.  He denies any headaches although currently he does have bitemporal headaches that is mild.  Denies nausea vomiting chest pain cough or abdominal pain. Patient is said to have followed up with ophthalmology and ENT and nothing remarkable was found from their standpoint. ED course: Upon arrival to the emergency room temperature 97.9 respiratory rate 16 pulse 70 blood pressure 138/60 saturating 97% on room air.  ED physician discussed the case with neurology Dr. Selina Cooley who agreed for patient to be admitted for workup for possible TIA.  Hospitalist service was therefore contacted to admit patient for further management.  Review of Systems: As mentioned in the history of present illness. All other systems reviewed and are negative. Past Medical History:  Diagnosis Date   Arthritis    Arthrofibrosis of total knee arthroplasty (HCC)  05/30/2015   Bilateral pulmonary embolism (HCC)    a. 03/2015 CTA: acute bilat PE; b. IVC filter placed in 2017--> removed 2018.   Chest pain    a. 2015 Abnl MV;  b. 2015 Cath: nl cors.   Chronic diastolic CHF (congestive heart failure) (HCC)    a. 03/2015 Echo: EF 55-65%, poor windows, mildly dil RV.   DVT (deep venous thrombosis) (HCC)    a. 03/2015 U/S: occlusive DVT w/in the distal aspect of the Left fem vein through the L popliteal vein.   Dyspnea    On exertion   GERD (gastroesophageal reflux disease)    Headache    Hypertension    Hypertensive heart disease    Obesity    OSA (obstructive sleep apnea)    uses CPAP nightly settings at 15    PAF (paroxysmal atrial fibrillation) (HCC)    Pneumonia    hx of years ago    Primary localized osteoarthritis of left knee    a. 11/2014 s/p L TKA.   Primary localized osteoarthritis of right knee    a. 02/2014 s/p R Partial Knee arthroplasty.   Stiffness of left knee    a. 12/2014 s/p manipulation under anesthesia.   Urine incontinence    Past Surgical History:  Procedure Laterality Date   APPENDECTOMY     CARDIAC CATHETERIZATION     CARPAL TUNNEL RELEASE Left 03/2018   CYSTOSCOPY W/ RETROGRADES  12/09/2019   Procedure: CYSTOSCOPY WITH RETROGRADE PYELOGRAM;  Surgeon: Orson Ape, MD;  Location: [facility];  Service: Urology;;   CYSTOSCOPY WITH STENT PLACEMENT Right 12/09/2019   Procedure: CYSTOSCOPY  WITH STENT PLACEMENT;  Surgeon: Orson Ape, MD;  Location: [facility];  Service: Urology;  Laterality: Right;   EXAM UNDER ANESTHESIA WITH MANIPULATION OF KNEE Left 05/30/2015   Procedure: EXAM UNDER ANESTHESIA WITH MANIPULATION OF LEFT KNEE;  Surgeon: Teryl Lucy, MD;  Location: [facility];  Service: Orthopedics;  Laterality: Left;   HIATAL HERNIA REPAIR N/A 10/26/2019   Procedure: HIATAL HERNIA REPAIR;  Surgeon: Luretha Murphy, MD;  Location: [facility];  Service: General;  Laterality: N/A;   I & D KNEE WITH POLY EXCHANGE Left 10/30/2015    Procedure: IRRIGATION AND DEBRIDEMENT KNEE WITH POLY EXCHANGE PLACE SPACERS;  Surgeon: Gean Birchwood, MD;  Location: [facility];  Service: Orthopedics;  Laterality: Left;  OFF ELIQUIST 3 DAYS   IVC FILTER PLACEMENT ([facility] HX)  04/13/15   IVC FILTER REMOVAL N/A 08/13/2016   Procedure: IVC Filter Removal;  Surgeon: Renford Dills, MD;  Location: [facility];  Service: Cardiovascular;  Laterality: N/A;   JOINT REPLACEMENT     KNEE ARTHROSCOPY Left 08/08/2015   Procedure: LEFT KNEE MANIPULATION WITH ARTHROSCOPIC LYSIS OF ADHESIONS AND ASPIRATION;  Surgeon: Teryl Lucy, MD;  Location: [facility];  Service: Orthopedics;  Laterality: Left;   KNEE CLOSED REDUCTION Left 01/20/2015   Procedure: LEFT KNEE MANIPULATION;  Surgeon: Teryl Lucy, MD;  Location: [facility];  Service: Orthopedics;  Laterality: Left;   LAPAROSCOPIC GASTRIC SLEEVE RESECTION N/A 10/26/2019   Procedure: LAPAROSCOPIC SLEEVE GASTRECTOMY;  Surgeon: Luretha Murphy, MD;  Location: [facility];  Service: General;  Laterality: N/A;   LEFT HEART CATHETERIZATION WITH CORONARY ANGIOGRAM N/A 01/12/2014   Procedure: LEFT HEART CATHETERIZATION WITH CORONARY ANGIOGRAM;  Surgeon: Corky Crafts, MD;  Location: [facility];  Service: Cardiovascular;  Laterality: N/A;   ORTHOPEDIC SURGERY Left    arthroscopy x3   PARTIAL KNEE ARTHROPLASTY Right 02/25/2014   Procedure: RIGHT KNEE ARTHROPLASTY CONDYLE AND PLATEAU MEDIAL COMPARTMENT ;  Surgeon: Eulas Post, MD;  Location: [facility];  Service: Orthopedics;  Laterality: Right;   PERIPHERAL VASCULAR CATHETERIZATION N/A 03/29/2015   Procedure: IVC Filter Insertion, and possible thrombectomy;  Surgeon: Renford Dills, MD;  Location: [facility];  Service: Cardiovascular;  Laterality: N/A;   ROTATOR CUFF REPAIR Left    TOTAL KNEE ARTHROPLASTY  12/06/2014   Procedure: TOTAL KNEE ARTHROPLASTY;  Surgeon: Teryl Lucy, MD;  Location: [facility];  Service: Orthopedics;;    TOTAL KNEE REVISION Left 02/08/2016   Procedure: TOTAL KNEE REVISION;  Surgeon: Gean Birchwood, MD;  Location: [facility];  Service: Orthopedics;  Laterality: Left;   UPPER GI ENDOSCOPY N/A 10/26/2019   Procedure: UPPER GI ENDOSCOPY;  Surgeon: Luretha Murphy, MD;  Location: [facility];  Service: General;  Laterality: N/A;   URETEROSCOPY WITH HOLMIUM LASER LITHOTRIPSY Right 12/30/2019   Procedure: URETEROSCOPY WITH HOLMIUM LASER LITHOTRIPSY;  Surgeon: Orson Ape, MD;  Location: [facility];  Service: Urology;  Laterality: Right;   Social History:  reports that he has never smoked. He has never used smokeless tobacco. He reports that he does not currently use alcohol. He reports that he does not use drugs.  Allergies  Allergen Reactions   Penicillins Anaphylaxis and Other (See Comments)    From childhood; uncertain of reaction Has patient had a PCN reaction causing immediate rash, facial/tongue/throat swelling, SOB or lightheadedness with hypotension: Unk Has patient had a PCN reaction causing severe rash involving mucus membranes or skin necrosis: Unk Has patient had a PCN reaction  that required hospitalization: Unk Has patient had a PCN reaction occurring within the last 10 years: No If all of the above answers are "NO", then may proceed with Cephalosporin use.   Vancomycin Other (See Comments)    Red Mans' syndrome   Penicillins Other (See Comments)    Childhood   Vancomycin Other (See Comments)    Red man Syndrome    Family History  Problem Relation Age of Onset   Coronary artery disease Mother    Hyperlipidemia Mother    Hypertension Mother    Arthritis Mother    Coronary artery disease Father    Heart disease Paternal Grandmother    Alzheimer's disease Paternal Grandfather     Prior to Admission medications   Medication Sig Start Date End Date Taking? Authorizing Provider  allopurinol (ZYLOPRIM) 100 MG tablet TAKE ONE TABLET (100 MG TOTAL) BY MOUTH DAILY. 11/01/22   Lorre Munroe,  NP  baclofen (LIORESAL) 10 MG tablet Take 1 tablet (10 mg total) by mouth 2 (two) times daily. 11/08/22   Lorre Munroe, NP  colchicine 0.6 MG tablet TAKE ONE TABLET BY MOUTH TWICE A DAY 09/26/22   Baity, Salvadore Oxford, NP  ELIQUIS 5 MG TABS tablet TAKE ONE TABLET BY MOUTH TWO TIMES DAILY 10/15/22   Iran Ouch, MD  flecainide (TAMBOCOR) 100 MG tablet Take (1) one tablet by mouth (2) twice daily 04/10/22   Iran Ouch, MD  furosemide (LASIX) 20 MG tablet TAKE ONE TABLET (20 MG TOTAL) BY MOUTH DAILY AS NEEDED. 11/21/22   Lorre Munroe, NP  Multiple Vitamins-Minerals (MULTIVITAMIN WITH MINERALS) tablet Take 1 tablet by mouth daily.    [provider]  pantoprazole (PROTONIX) 40 MG tablet TAKE ONE TABLET BY MOUTH ONCE A DAY 08/27/22   Lorre Munroe, NP  Sennosides-Docusate Sodium (SM STOOL SOFTENER/LAXATIVE PO) Take 8.6 mg by mouth daily.    [provider]  topiramate (TOPAMAX) 25 MG tablet Take 1 tablet (25 mg total) by mouth at bedtime. 11/21/22   Lorre Munroe, NP  traMADol (ULTRAM) 50 MG tablet TAKE ONE TABLET (50 MG TOTAL) BY MOUTH DAILY AS NEEDED. 06/11/22   Lorre Munroe, NP    Physical Exam: Appearance: He is normal weight.  HENT:     Head: Normocephalic and atraumatic.     Nose: Nose normal.     Mouth/Throat:     Mouth: Mucous membranes are moist.  Eyes:     Pupils: Pupils are equal, round, and reactive to light.  Cardiovascular:     Rate and Rhythm: Normal rate and regular rhythm.  Pulmonary:     Effort: Pulmonary effort is normal.  Abdominal: Mild tenderness around umbilicus area to the right Musculoskeletal:        General: Normal range of motion.  Skin:    General: Skin is warm.  Neurological:     General: No focal deficit present.   Data Reviewed: I have reviewed CT scan of the brain that did not show acute intracranial pathology, I have discussed the case with the ED physician and have also briefly reviewed patient's old records    Latest  Ref Rng & Units 12/10/2022   10:35 AM 11/08/2022    9:48 AM 05/09/2022    9:55 AM  CBC  WBC 4.0 - 10.5 K/uL 10.1  5.8  6.0   Hemoglobin 13.0 - 17.0 g/dL 16.1  09.6  04.5   Hematocrit 39.0 - 52.0 % 46.4  45.3  46.2   Platelets 150 - 400 K/uL 163  180  182        Latest Ref Rng & Units 12/10/2022   10:35 AM 11/08/2022    9:48 AM 05/09/2022    9:55 AM  BMP  Glucose 70 - 99 mg/dL 409  80  83   BUN 8 - 23 mg/dL 20  21  18    Creatinine 0.61 - 1.24 mg/dL 8.11  9.14  7.82   BUN/Creat Ratio 6 - 22 (calc)  SEE NOTE:  SEE NOTE:   Sodium 135 - 145 mmol/L 138  142  141   Potassium 3.5 - 5.1 mmol/L 3.5  4.4  4.0   Chloride 98 - 111 mmol/L 107  105  106   CO2 22 - 32 mmol/L 24  30  27    Calcium 8.9 - 10.3 mg/dL 8.6  9.3  9.6     Vitals:   12/10/22 1033 12/10/22 1513 12/10/22 1515 12/10/22 1516  BP:   (!) 112/48   Pulse:  71 (!) 56   Resp:   20   Temp:    98 F (36.7 C)  TempSrc:    Oral  SpO2:  94% 98%   Weight: 111.1 kg     Height: 5\' 9"  (1.753 m)       Assessment and Plan:  Visual impairment to rule out cerebellar stroke CT scan of the brain no acute intracranial abnormality Neurologist consulted Obtain echocardiogram Obtain MRI of the brain Carotid ultrasound  Atrial fibrillation on Eliquis,  Continue flecainide and Eliquis  History of DVT,  Pulmonary embolism,  Continue Eliquis   Hypertension,  Continue home medications  Obesity, BMI 36 OSA Counseled on weight loss with diet and exercise when medically stable  Hypertensive heart disease,  Continue home medications  GERD-not on PPI  OSA uses CPAP at night cPAP at night  Chronic diastolic congestive heart failure  Continue to monitor input and output Daily weighing Follow-up on echocardiogram   Advance Care Planning:   Code Status: Prior full code  Consults: Neurologist  Family Communication: None present at bedside  Severity of Illness: The appropriate patient status for this patient is OBSERVATION.  Observation status is judged to be reasonable and necessary in order to provide the required intensity of service to ensure the patient's safety. The patient's presenting symptoms, physical exam findings, and initial radiographic and laboratory data in the context of their medical condition is felt to place them at decreased risk for further clinical deterioration. Furthermore, it is anticipated that the patient will be medically stable for discharge from the hospital within 2 midnights of admission.   Author: Loyce Dys, MD 12/10/2022 4:59 PM  For on call review [email or web address].

## 2022-12-10 NOTE — ED Notes (Signed)
Pt resting and sleeping intermittently.

## 2022-12-11 ENCOUNTER — Observation Stay: Payer: Medicare Other

## 2022-12-11 ENCOUNTER — Observation Stay
Admit: 2022-12-11 | Discharge: 2022-12-11 | Disposition: A | Discharge status: Home / Self Care | Payer: Medicare Other | Attending: Internal Medicine

## 2022-12-11 DIAGNOSIS — R42 Dizziness and giddiness: Secondary | ICD-10-CM | POA: Diagnosis not present

## 2022-12-11 DIAGNOSIS — H9311 Tinnitus, right ear: Secondary | ICD-10-CM

## 2022-12-11 DIAGNOSIS — H5509 Other forms of nystagmus: Secondary | ICD-10-CM | POA: Diagnosis not present

## 2022-12-11 DIAGNOSIS — H538 Other visual disturbances: Secondary | ICD-10-CM | POA: Diagnosis not present

## 2022-12-11 DIAGNOSIS — H9190 Unspecified hearing loss, unspecified ear: Secondary | ICD-10-CM

## 2022-12-11 DIAGNOSIS — G459 Transient cerebral ischemic attack, unspecified: Secondary | ICD-10-CM | POA: Diagnosis not present

## 2022-12-11 LAB — ECHOCARDIOGRAM COMPLETE
AR max vel: 3.85 cm2
AV Area VTI: 5.08 cm2
AV Area mean vel: 3.89 cm2
AV Mean grad: 2.5 mm[Hg]
AV Peak grad: 3.9 mm[Hg]
Ao pk vel: 0.99 m/s
Area-P 1/2: 2.88 cm2
Height: 69 in
MV VTI: 3.66 cm2
S' Lateral: 3.3 cm
Weight: 3918.9 [oz_av]

## 2022-12-11 LAB — BASIC METABOLIC PANEL
Anion gap: 3 — ABNORMAL LOW (ref 5–15)
BUN: 19 mg/dL (ref 8–23)
CO2: 26 mmol/L (ref 22–32)
Calcium: 8.7 mg/dL — ABNORMAL LOW (ref 8.9–10.3)
Chloride: 110 mmol/L (ref 98–111)
Creatinine, Ser: 1.18 mg/dL (ref 0.61–1.24)
GFR, Estimated: >60 mL/min (ref >60)
Glucose, Bld: 93 mg/dL (ref 70–99)
Potassium: 4.1 mmol/L (ref 3.5–5.1)
Sodium: 139 mmol/L (ref 135–145)

## 2022-12-11 LAB — CBC
HCT: 43.9 % (ref 39.0–52.0)
Hemoglobin: 15.3 g/dL (ref 13.0–17.0)
MCH: 31.4 pg (ref 26.0–34.0)
MCHC: 34.9 g/dL (ref 30.0–36.0)
MCV: 90 fL (ref 80.0–100.0)
Platelets: 162 10*3/uL (ref 150–400)
RBC: 4.88 MIL/uL (ref 4.22–5.81)
RDW: 13.1 % (ref 11.5–15.5)
WBC: 7.4 10*3/uL (ref 4.0–10.5)
nRBC: 0 % (ref 0.0–0.2)

## 2022-12-11 LAB — HIV ANTIBODY (ROUTINE TESTING W REFLEX): HIV Screen 4th Generation wRfx: NONREACTIVE

## 2022-12-11 MED ORDER — GADOBUTROL 1 MMOL/ML IV SOLN
10.0000 mL | Freq: Once | INTRAVENOUS | Status: AC | PRN
  Administered 2022-12-11: 10 mL via INTRAVENOUS

## 2022-12-11 MED ORDER — IOHEXOL 350 MG/ML SOLN
75.0000 mL | Freq: Once | INTRAVENOUS | Status: AC | PRN
  Administered 2022-12-11: 75 mL via INTRAVENOUS

## 2022-12-11 MED ORDER — BACLOFEN 10 MG PO TABS: 10.0000 mg | ORAL_TABLET | Freq: Two times a day (BID) | ORAL | Status: DC | PRN

## 2022-12-11 NOTE — Progress Notes (Signed)
Progress Note   Patient: Richard [name]:[id number] DOB: [birth year] DOA: 12/10/2022     0 DOS: the patient was seen and examined on 12/11/2022   Brief hospital course: Richard [name] is a 62 y.o. male with medical history significant of atrial fibrillation on Eliquis, history of DVT, pulmonary embolism, hypertension, obesity, hypertensive heart disease, GERD, OSA uses CPAP at night, chronic diastolic congestive heart failure who follows up with cardiologist presents today with 2 months history of blurry vision.  According to patient he has been having blurry vision that has been going on for the last 2 months usually starts off with oscillation of both eyes and vision becomes blurry with associated quadruple vision.  According to patient while sitting at home watching TV vision became blurry to the point that he saw four monitors of the TV screen.  He also has associated impairment of his imbalance during the time of the blurry vision.  He denies any headaches although currently he does have bitemporal headaches that is mild.  Denies nausea vomiting chest pain cough or abdominal pain. Patient is said to have followed up with ophthalmology and ENT and nothing remarkable was found from their standpoint. ED course: Upon arrival to the emergency room temperature 97.9 respiratory rate 16 pulse 70 blood pressure 138/60 saturating 97% on room air.  ED physician discussed the case with neurology Dr. Selina Cooley who agreed for patient to be admitted for workup for possible TIA.  Hospitalist service was therefore contacted to admit patient for further management.    Assessment and Plan: Visual impairment to rule out cerebellar stroke CT scan of the brain no acute intracranial abnormality MRI without contrast reviewed with did not show any acute pathology Neurologist Dr. Zara Chess on board and case discussed Neurologist is going to obtain additional imaging to better assess his posterior circulation. At  this point neurologist believes this is likely peripheral vertigo and patient may benefit from outpatient follow-up with ENT if additional imaging is unrevealing. Follow-up on echocardiogram report    Atrial fibrillation on Eliquis,  Continue flecainide and Eliquis   History of DVT,  Pulmonary embolism,  Continue Eliquis    Hypertension,  Continue home medications   Obesity, BMI 36 OSA Counseled on weight loss with diet and exercise when medically stable   Hypertensive heart disease,  Continue home medications   GERD Continue Protonix   OSA uses CPAP at night cPAP at night   Chronic diastolic congestive heart failure  Continue to monitor input and output Daily weighing Follow-up on echocardiogram    Advance Care Planning:   Code Status: Prior full code   Consults: Neurologist   Subjective:  Patient seen and examined at bedside this morning Did complain of right-sided tinnitus He has not experienced the episode of blurry vision today Neurologist has seen patient and still needing more detailed imaging before clearance for discharge  Physical Exam: Appearance: He is normal weight.  HENT:     Head: Normocephalic and atraumatic.     Nose: Nose normal.     Mouth/Throat:     Mouth: Mucous membranes are moist.  Eyes:     Pupils: Pupils are equal, round, and reactive to light.  Cardiovascular:     Rate and Rhythm: Normal rate and regular rhythm.  Pulmonary:     Effort: Pulmonary effort is normal.  Abdominal: Mild tenderness around umbilicus area to the right Musculoskeletal:        General: Normal range of motion.  Skin:  General: Skin is warm.  Neurological:     General: No focal deficit present.   Vitals:   12/10/22 1854 12/11/22 0016 12/11/22 0440 12/11/22 0847  BP: (!) 135/57 110/61 (!) 102/52 113/61  Pulse: (!) 46  (!) 50 (!) 51  Resp: 16 18 18 17   Temp: 98 F (36.7 C) 98.1 F (36.7 C) 98.1 F (36.7 C) 97.8 F (36.6 C)  TempSrc: Oral  Oral    SpO2: 98% 96% 97% 95%  Weight:      Height:        Data Reviewed: I have reviewed MRI of the brain that did not show acute intracranial pathology    Latest Ref Rng & Units 12/11/2022    4:53 AM 12/10/2022   10:35 AM 11/08/2022    9:48 AM  BMP  Glucose 70 - 99 mg/dL 93  960  80   BUN 8 - 23 mg/dL 19  20  21    Creatinine 0.61 - 1.24 mg/dL 4.54  0.98  1.19   BUN/Creat Ratio 6 - 22 (calc)   SEE NOTE:   Sodium 135 - 145 mmol/L 139  138  142   Potassium 3.5 - 5.1 mmol/L 4.1  3.5  4.4   Chloride 98 - 111 mmol/L 110  107  105   CO2 22 - 32 mmol/L 26  24  30    Calcium 8.9 - 10.3 mg/dL 8.7  8.6  9.3        Latest Ref Rng & Units 12/11/2022    4:53 AM 12/10/2022   10:35 AM 11/08/2022    9:48 AM  CBC  WBC 4.0 - 10.5 K/uL 7.4  10.1  5.8   Hemoglobin 13.0 - 17.0 g/dL 14.7  82.9  56.2   Hematocrit 39.0 - 52.0 % 43.9  46.4  45.3   Platelets 150 - 400 K/uL 162  163  180     Family Communication: Discussed with patient's family present at bedside  Disposition: Hopefully discharge home after clearance by neurologist Time spent: 58 minutes  Author: Loyce Dys, MD 12/11/2022 3:38 PM  For on call review [email or web address].
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
## 2022-12-11 NOTE — Progress Notes (Signed)
*  PRELIMINARY RESULTS* Echocardiogram 2D Echocardiogram has been performed.  Cristela Blue 12/11/2022, 7:53 AM

## 2022-12-11 NOTE — Plan of Care (Signed)
  Problem: Education: Goal: Knowledge of General Education information will improve Description Including pain rating scale, medication(s)/side effects and non-pharmacologic comfort measures Outcome: Progressing   

## 2022-12-11 NOTE — Plan of Care (Signed)
  Problem: Education: Goal: Knowledge of General Education information will improve Description: Including pain rating scale, medication(s)/side effects and non-pharmacologic comfort measures Outcome: Progressing   Problem: Clinical Measurements: Goal: Ability to maintain clinical measurements within normal limits will improve Outcome: Progressing   Problem: Safety: Goal: Ability to remain free from injury will improve Outcome: Progressing   

## 2022-12-12 DIAGNOSIS — G459 Transient cerebral ischemic attack, unspecified: Secondary | ICD-10-CM | POA: Diagnosis not present

## 2022-12-12 LAB — CBC WITH DIFFERENTIAL/PLATELET
Abs Immature Granulocytes: 0.02 10*3/uL (ref 0.00–0.07)
Basophils Absolute: 0.1 10*3/uL (ref 0.0–0.1)
Basophils Relative: 1 %
Eosinophils Absolute: 0.3 10*3/uL (ref 0.0–0.5)
Eosinophils Relative: 4 %
HCT: 45.2 % (ref 39.0–52.0)
Hemoglobin: 15.8 g/dL (ref 13.0–17.0)
Immature Granulocytes: 0 %
Lymphocytes Relative: 30 %
Lymphs Abs: 2.1 10*3/uL (ref 0.7–4.0)
MCH: 31.6 pg (ref 26.0–34.0)
MCHC: 35 g/dL (ref 30.0–36.0)
MCV: 90.4 fL (ref 80.0–100.0)
Monocytes Absolute: 0.7 10*3/uL (ref 0.1–1.0)
Monocytes Relative: 10 %
Neutro Abs: 3.9 10*3/uL (ref 1.7–7.7)
Neutrophils Relative %: 55 %
Platelets: 159 10*3/uL (ref 150–400)
RBC: 5 MIL/uL (ref 4.22–5.81)
RDW: 12.9 % (ref 11.5–15.5)
WBC: 7.1 10*3/uL (ref 4.0–10.5)
nRBC: 0 % (ref 0.0–0.2)

## 2022-12-12 LAB — BASIC METABOLIC PANEL
Anion gap: 9 (ref 5–15)
BUN: 24 mg/dL — ABNORMAL HIGH (ref 8–23)
CO2: 26 mmol/L (ref 22–32)
Calcium: 9.1 mg/dL (ref 8.9–10.3)
Chloride: 106 mmol/L (ref 98–111)
Creatinine, Ser: 1.38 mg/dL — ABNORMAL HIGH (ref 0.61–1.24)
GFR, Estimated: 58 mL/min — ABNORMAL LOW (ref >60)
Glucose, Bld: 88 mg/dL (ref 70–99)
Potassium: 3.8 mmol/L (ref 3.5–5.1)
Sodium: 141 mmol/L (ref 135–145)

## 2022-12-12 NOTE — Discharge Instructions (Signed)
Patient advised to follow-up with ENT(as always seen one) and follow-up with your ophthalmologist patient is advised to keep appointment with Dr. Malvin Johns on December 17 neurology  Patient is advised NOT TO DRIVE will his cleared by any of the above specialist and/or his PCP.

## 2022-12-12 NOTE — Care Management Obs Status (Signed)
MEDICARE OBSERVATION STATUS NOTIFICATION   Patient Details  Name: Richard [name] MRN: [id number] Date of Birth: 10-01-1960   Medicare Observation Status Notification Given:  Yes    Kobe Jansma, LCSW 12/12/2022, 8:44 AM

## 2022-12-12 NOTE — Discharge Summary (Signed)
Physician Discharge Summary   Patient: Richard [name] MRN: [id number] DOB: [birth year]  Admit date:     12/10/2022  Discharge date: 12/12/22  Discharge Physician: Enedina Finner   PCP: Lorre Munroe, NP   Recommendations at discharge:    Patient advised to follow-up with ENT(as always seen one) and follow-up with your ophthalmologist patient is advised to keep appointment with Dr. Malvin Johns on December 17 neurology  Patient is advised NOT TO DRIVE will his cleared by any of the above specialist and/or his PCP. Follow-up with PCP in 1 to 2 weeks  Discharge Diagnoses: Principal Problem:   TIA (transient ischemic attack)  Richard [name] is a 62 y.o. male with medical history significant of atrial fibrillation on Eliquis, history of DVT, pulmonary embolism, hypertension, obesity, hypertensive heart disease, GERD, OSA uses CPAP at night, chronic diastolic congestive heart failure who follows up with cardiologist presents today with 2 months history of blurry vision.  According to patient he has been having blurry vision that has been going on for the last 2 months usually starts off with oscillation of both eyes and vision becomes blurry with associated quadruple vision   CT angiogram head and neck. No acute intracranial process. 2. No intracranial large vessel occlusion or significant stenosis. 3. No hemodynamically significant stenosis in the neck.  MRI brain with contrast No abnormal parenchymal or meningeal enhancement.  Visual disturbance with tinnitus ruled out cerebellar stroke -- suspect questionable vertigo versus vestibular migraine although less likely given absence of headache -- Per neurology patient counsel not to drive until episodes have resolved and he is cleared by one of the outpatient physician. -- Patient was seen by ENT as outpatient and was found to have abnormal opticokinetic testing and referred him to neurology -- patient has appointment with Dr. Malvin Johns on December  17 have asked patient to keep that appointment -- continue eliquis -- vestibular PT is recommended as outpatient by neurology however patient wants to wait till he follows up with outpatient neurology and decide  History of PE/DVT -- on eliquis  History of a fib --Continue flecainide and Eliquis   Hypertension -- continue home meds  obesity/obstructive sleep apnea -- continue CPAP  Gerd -- on Protonix  Hemodynamically otherwise stable. Discharge plan discussed with patient. No family at bedside. Patient is agreeable.        Pain control - [employer] Controlled Substance Reporting System database was reviewed. and patient was instructed, not to drive, operate heavy machinery, perform activities at heights, swimming or participation in water activities or provide baby-sitting services while on Pain, Sleep and Anxiety Medications; until their outpatient Physician has advised to do so again. Also recommended to not to take more than prescribed Pain, Sleep and Anxiety Medications.  Consultants: neurology Procedures performed:     Disposition: Home Diet recommendation:  Discharge Diet Orders (From admission, onward)     Start     Ordered   12/12/22 0000  Diet - low sodium heart healthy        12/12/22 1034           Cardiac diet DISCHARGE MEDICATION: Allergies as of 12/12/2022       Reactions   Penicillins Anaphylaxis, Other (See Comments)   From childhood; uncertain of reaction Has patient had a PCN reaction causing immediate rash, facial/tongue/throat swelling, SOB or lightheadedness with hypotension: Unk Has patient had a PCN reaction causing severe rash involving mucus membranes or skin necrosis: Unk Has patient had a PCN  reaction that required hospitalization: Unk Has patient had a PCN reaction occurring within the last 10 years: No If all of the above answers are "NO", then may proceed with Cephalosporin use.   Vancomycin Other (See Comments)   Red Mans'  syndrome   Penicillins Other (See Comments)   Childhood   Vancomycin Other (See Comments)   Red man Syndrome        Medication List     TAKE these medications    allopurinol 100 MG tablet Commonly known as: ZYLOPRIM TAKE ONE TABLET (100 MG TOTAL) BY MOUTH DAILY.   baclofen 10 MG tablet Commonly known as: LIORESAL Take 1 tablet (10 mg total) by mouth 2 (two) times daily.   colchicine 0.6 MG tablet TAKE ONE TABLET BY MOUTH TWICE A DAY   Eliquis 5 MG Tabs tablet Generic drug: apixaban TAKE ONE TABLET BY MOUTH TWO TIMES DAILY   flecainide 100 MG tablet Commonly known as: TAMBOCOR Take (1) one tablet by mouth (2) twice daily   furosemide 20 MG tablet Commonly known as: LASIX TAKE ONE TABLET (20 MG TOTAL) BY MOUTH DAILY AS NEEDED.   multivitamin with minerals tablet Take 1 tablet by mouth daily.   pantoprazole 40 MG tablet Commonly known as: PROTONIX TAKE ONE TABLET BY MOUTH ONCE A DAY   SM STOOL SOFTENER/LAXATIVE PO Take 8.6 mg by mouth daily.   topiramate 25 MG tablet Commonly known as: Topamax Take 1 tablet (25 mg total) by mouth at bedtime.   traMADol 50 MG tablet Commonly known as: ULTRAM TAKE ONE TABLET (50 MG TOTAL) BY MOUTH DAILY AS NEEDED.        Follow-up Information     Lorre Munroe, NP. Schedule an appointment as soon as possible for a visit in 1 week(s).   Specialties: Internal Medicine, Emergency Medicine Contact information: [address] [place] [address] [phone]                Discharge Exam: Ceasar Mons Weights   12/10/22 1033  Weight: 111.1 kg      Morbid obesity. Cardiovascular both heart sounds normal respiratory clear to auscultation neuro- grossly nonfocal  Condition at discharge: fair  The results of significant diagnostics from this hospitalization (including imaging, microbiology, ancillary and laboratory) are listed below for reference.   Imaging Studies: CT ANGIO HEAD NECK W WO CM  Result  Date: 12/12/2022 CLINICAL DATA:  Vertigo, rule out vertebrobasilar insufficiency EXAM: CT ANGIOGRAPHY HEAD AND NECK WITH AND WITHOUT CONTRAST TECHNIQUE: Multidetector CT imaging of the head and neck was performed using the standard protocol during bolus administration of intravenous contrast. Multiplanar CT image reconstructions and MIPs were obtained to evaluate the vascular anatomy. Carotid stenosis measurements (when applicable) are obtained utilizing NASCET criteria, using the distal internal carotid diameter as the denominator. RADIATION DOSE REDUCTION: This exam was performed according to the departmental dose-optimization program which includes automated exposure control, adjustment of the mA and/or kV according to patient size and/or use of iterative reconstruction technique. CONTRAST:  75mL OMNIPAQUE IOHEXOL 350 MG/ML SOLN COMPARISON:  No prior CTA available, correlation is made with 12/10/2022 CT head and 11/20/2022 MRA head FINDINGS: CT HEAD FINDINGS Brain: No evidence of acute infarct, hemorrhage, mass, mass effect, or midline shift. No hydrocephalus or extra-axial fluid collection. Vascular: No hyperdense vessel. Skull: Negative for fracture or focal lesion. Sinuses/Orbits: Mucosal thickening in the ethmoid air cells and right maxillary sinus. No acute finding in the orbits. Other: The mastoid air cells are  well aerated. CTA NECK FINDINGS Aortic arch: Standard branching. Imaged portion shows no evidence of aneurysm or dissection. No significant stenosis of the major arch vessel origins. Right carotid system: No evidence of dissection, occlusion, or hemodynamically significant stenosis (greater than 50%). Left carotid system: No evidence of dissection, occlusion, or hemodynamically significant stenosis (greater than 50%). Vertebral arteries: No evidence of dissection, occlusion, or hemodynamically significant stenosis (greater than 50%). Skeleton: No acute osseous abnormality. Degenerative changes in  the cervical spine. Other neck: No acute finding. Upper chest: No focal pulmonary opacity or pleural effusion. Prominent mediastinal lymph nodes, which measure up to 1.1 cm in short axis, not enlarged by CT size criteria Review of the MIP images confirms the above findings CTA HEAD FINDINGS Anterior circulation: Both internal carotid arteries are patent to the termini, without significant stenosis. A1 segments patent. Normal anterior communicating artery. Anterior cerebral arteries are patent to their distal aspects without significant stenosis. No M1 stenosis or occlusion. MCA branches perfused to their distal aspects without significant stenosis. Posterior circulation: Vertebral arteries patent to the vertebrobasilar junction without significant stenosis. The right vertebral artery primarily supplies the right PICA. Posterior inferior cerebellar arteries patent proximally. Basilar patent to its distal aspect without significant stenosis. Superior cerebellar arteries patent proximally. The P1 segments are not definitively visualized, but there may be very hypoplastic P1 segments. Fetal origin of the bilateral PCAs from the posterior communicating arteries. PCAs perfused to their distal aspects without significant stenosis. Venous sinuses: As permitted by contrast timing, patent. Anatomic variants: Fetal origin of the bilateral PCAs. No evidence of aneurysm or vascular malformation. Review of the MIP images confirms the above findings IMPRESSION: 1. No acute intracranial process. 2. No intracranial large vessel occlusion or significant stenosis. 3. No hemodynamically significant stenosis in the neck. Electronically Signed   By: Wiliam Ke M.D.   On: 12/12/2022 03:08   MR BRAIN W CONTRAST  Result Date: 12/12/2022 CLINICAL DATA:  Blurred vision, dizziness, TIA EXAM: MRI HEAD WITH CONTRAST TECHNIQUE: Multiplanar, multiecho pulse sequences of the brain and surrounding structures were obtained with intravenous  contrast. CONTRAST:  10mL GADAVIST GADOBUTROL 1 MMOL/ML IV SOLN COMPARISON:  12/10/2022 MRI head without contrast FINDINGS: As the patient recently had a non-contrast MRI of the brain, only T1 axial precontrast and axial, coronal, and sagittal postcontrast sequences were obtained. No abnormal parenchymal or meningeal enhancement. Normal arterial and venous enhancement. The exam is otherwise unchanged compared to the 12/10/2022 exam. IMPRESSION: No abnormal parenchymal or meningeal enhancement. Electronically Signed   By: Wiliam Ke M.D.   On: 12/12/2022 03:00   ECHOCARDIOGRAM COMPLETE  Result Date: 12/11/2022    ECHOCARDIOGRAM REPORT   Patient Name:   LONIE FOLAND Date of Exam: 12/11/2022 Medical Rec #:  [id number]        Height:       69.0 in Accession #:    [id number]       Weight:       244.9 lb Date of Birth:  1960/06/15        BSA:          2.252 m Patient Age:    62 years         BP:           102/52 mmHg Patient Gender: M                HR:           50 bpm. Exam Location:  [facility] Procedure: 2D  Echo, Cardiac Doppler, Color Doppler and Saline Contrast Bubble            Study Indications:     TIA G45.9  History:         Patient has prior history of Echocardiogram examinations, most                  recent 03/28/2015. Signs/Symptoms:Chest Pain; Risk                  Factors:Hypertension.  Sonographer:     Cristela Blue Referring Phys:  [id number] Loyce Dys Diagnosing Phys: Julien Nordmann MD IMPRESSIONS  1. Left ventricular ejection fraction, by estimation, is 60 to 65%. The left ventricle has normal function. The left ventricle has no regional wall motion abnormalities. Left ventricular diastolic parameters are consistent with Grade I diastolic dysfunction (impaired relaxation).  2. Right ventricular systolic function is normal. The right ventricular size is normal. There is normal pulmonary artery systolic pressure. The estimated right ventricular systolic pressure is 18.1 mmHg.  3. The mitral valve is  normal in structure. No evidence of mitral valve regurgitation. No evidence of mitral stenosis.  4. The aortic valve has an indeterminant number of cusps. Aortic valve regurgitation is mild. No aortic stenosis is present.  5. The inferior vena cava is normal in size with greater than 50% respiratory variability, suggesting right atrial pressure of 3 mmHg.  6. Agitated saline contrast bubble study was negative, with no evidence of any interatrial shunt. FINDINGS  Left Ventricle: Left ventricular ejection fraction, by estimation, is 60 to 65%. The left ventricle has normal function. The left ventricle has no regional wall motion abnormalities. The left ventricular internal cavity size was normal in size. There is  no left ventricular hypertrophy. Left ventricular diastolic parameters are consistent with Grade I diastolic dysfunction (impaired relaxation). Right Ventricle: The right ventricular size is normal. No increase in right ventricular wall thickness. Right ventricular systolic function is normal. There is normal pulmonary artery systolic pressure. The tricuspid regurgitant velocity is 1.81 m/s, and  with an assumed right atrial pressure of 5 mmHg, the estimated right ventricular systolic pressure is 18.1 mmHg. Left Atrium: Left atrial size was normal in size. Right Atrium: Right atrial size was normal in size. Pericardium: There is no evidence of pericardial effusion. Mitral Valve: The mitral valve is normal in structure. No evidence of mitral valve regurgitation. No evidence of mitral valve stenosis. MV peak gradient, 2.4 mmHg. The mean mitral valve gradient is 1.0 mmHg. Tricuspid Valve: The tricuspid valve is normal in structure. Tricuspid valve regurgitation is not demonstrated. No evidence of tricuspid stenosis. Aortic Valve: The aortic valve has an indeterminant number of cusps. Aortic valve regurgitation is mild. No aortic stenosis is present. Aortic valve mean gradient measures 2.5 mmHg. Aortic valve  peak gradient measures 3.9 mmHg. Aortic valve area, by VTI measures 5.08 cm. Pulmonic Valve: The pulmonic valve was normal in structure. Pulmonic valve regurgitation is not visualized. No evidence of pulmonic stenosis. Aorta: The aortic root is normal in size and structure. Venous: The inferior vena cava is normal in size with greater than 50% respiratory variability, suggesting right atrial pressure of 3 mmHg. IAS/Shunts: No atrial level shunt detected by color flow Doppler. Agitated saline contrast was given intravenously to evaluate for intracardiac shunting. Agitated saline contrast bubble study was negative, with no evidence of any interatrial shunt. There  is no evidence of a patent foramen ovale. There is no evidence of an atrial  septal defect.  LEFT VENTRICLE PLAX 2D LVIDd:         5.00 cm   Diastology LVIDs:         3.30 cm   LV e' medial:    6.42 cm/s LV PW:         1.10 cm   LV E/e' medial:  10.0 LV IVS:        1.20 cm   LV e' lateral:   10.20 cm/s LVOT diam:     2.20 cm   LV E/e' lateral: 6.3 LV SV:         91 LV SV Index:   41 LVOT Area:     3.80 cm  RIGHT VENTRICLE RV Basal diam:  3.50 cm RV Mid diam:    3.80 cm RV S prime:     14.10 cm/s TAPSE (M-mode): 2.7 cm LEFT ATRIUM             Index        RIGHT ATRIUM           Index LA diam:        3.30 cm 1.47 cm/m   RA Area:     18.40 cm LA Vol (A2C):   60.5 ml 26.87 ml/m  RA Volume:   50.80 ml  22.56 ml/m LA Vol (A4C):   46.1 ml 20.47 ml/m LA Biplane Vol: 54.0 ml 23.98 ml/m  AORTIC VALVE AV Area (Vmax):    3.85 cm AV Area (Vmean):   3.89 cm AV Area (VTI):     5.08 cm AV Vmax:           98.60 cm/s AV Vmean:          67.300 cm/s AV VTI:            0.180 m AV Peak Grad:      3.9 mmHg AV Mean Grad:      2.5 mmHg LVOT Vmax:         99.80 cm/s LVOT Vmean:        68.900 cm/s LVOT VTI:          0.240 m LVOT/AV VTI ratio: 1.34  AORTA Ao Root diam: 3.30 cm MITRAL VALVE               TRICUSPID VALVE MV Area (PHT): 2.88 cm    TR Peak grad:   13.1 mmHg MV  Area VTI:   3.66 cm    TR Vmax:        181.00 cm/s MV Peak grad:  2.4 mmHg MV Mean grad:  1.0 mmHg    SHUNTS MV Vmax:       0.78 m/s    Systemic VTI:  0.24 m MV Vmean:      51.8 cm/s   Systemic Diam: 2.20 cm MV Decel Time: 263 msec MV E velocity: 64.50 cm/s MV A velocity: 79.10 cm/s MV E/A ratio:  0.82 Julien Nordmann MD Electronically signed by Julien Nordmann MD Signature Date/Time: 12/11/2022/3:33:22 PM    Final    MR BRAIN WO CONTRAST  Result Date: 12/11/2022 CLINICAL DATA:  Blurred vision, dizziness, transient ischemic attack, AFib on Eliquis EXAM: MRI HEAD WITHOUT CONTRAST TECHNIQUE: Multiplanar, multiecho pulse sequences of the brain and surrounding structures were obtained without intravenous contrast. COMPARISON:  06/27/2022 MRI head, correlation is also made with 12/10/2022 CT head FINDINGS: Brain: No restricted diffusion to suggest acute or subacute infarct. No acute hemorrhage, mass, mass effect, or midline shift. No hydrocephalus or  extra-axial collection. Pituitary and craniocervical junction within normal limits. No hemosiderin deposition to suggest remote hemorrhage. Scattered T2 hyperintense signal in the periventricular white matter, likely the sequela of mild chronic small vessel ischemic disease. Vascular: Normal arterial flow voids. Skull and upper cervical spine: Normal marrow signal. Sinuses/Orbits: Mucosal thickening in the ethmoid air cells, right maxillary sinus, and bilateral sphenoid sinuses. No acute finding in the orbits. Other: The mastoid air cells are well aerated. IMPRESSION: No acute intracranial process. No evidence of acute or subacute infarct. Electronically Signed   By: Wiliam Ke M.D.   On: 12/11/2022 00:10   US Carotid Bilateral  Result Date: 12/10/2022 CLINICAL DATA:  TIA EXAM: BILATERAL CAROTID DUPLEX ULTRASOUND TECHNIQUE: Wallace Cullens scale imaging, color Doppler and duplex ultrasound were performed of bilateral carotid and vertebral arteries in the neck. COMPARISON:   None Available. FINDINGS: Criteria: Quantification of carotid stenosis is based on velocity parameters that correlate the residual internal carotid diameter with NASCET-based stenosis levels, using the diameter of the distal internal carotid lumen as the denominator for stenosis measurement. The following velocity measurements were obtained: RIGHT ICA: 84/20 cm/sec CCA: 71/20 cm/sec SYSTOLIC ICA/CCA RATIO:  0.2 ECA: 96 cm/sec LEFT ICA: 85/15 cm/sec CCA: 72/18 cm/sec SYSTOLIC ICA/CCA RATIO:  1.2 ECA: 113 cm/sec RIGHT CAROTID ARTERY: Preliminary grayscale images demonstrate no significant atherosclerotic plaque in the right carotid system. Velocities, waveforms and flow velocity ratios show no evidence of focal hemodynamically significant stenosis. RIGHT VERTEBRAL ARTERY:  Antegrade in nature. LEFT CAROTID ARTERY: Preliminary grayscale images demonstrate no significant atherosclerotic plaque in the left carotid system. Waveforms, velocities and flow velocity ratios show no evidence of focal hemodynamically significant stenosis. LEFT VERTEBRAL ARTERY:  Antegrade in nature. IMPRESSION: No evidence of focal carotid stenosis. Electronically Signed   By: Alcide Clever M.D.   On: 12/10/2022 22:31   CT HEAD WO CONTRAST  Result Date: 12/10/2022 CLINICAL DATA:  Neuro deficit, persistent/recurrent, CNS neoplasm suspected. Blurred vision and balance issues x2 months. EXAM: CT HEAD WITHOUT CONTRAST TECHNIQUE: Contiguous axial images were obtained from the base of the skull through the vertex without intravenous contrast. RADIATION DOSE REDUCTION: This exam was performed according to the departmental dose-optimization program which includes automated exposure control, adjustment of the mA and/or kV according to patient size and/or use of iterative reconstruction technique. COMPARISON:  Head CT 02/09/2020.  Brain MRI 06/27/2022. FINDINGS: Brain: No acute intracranial hemorrhage. Gray-white differentiation is preserved. No  hydrocephalus or extra-axial collection. No mass effect or midline shift. Vascular: No hyperdense vessel or unexpected calcification. Skull: No calvarial fracture or suspicious bone lesion. Skull base is unremarkable. Sinuses/Orbits: Mild mucosal disease in the ethmoid air cells and left sphenoid sinus. Orbits are unremarkable. Other: None. IMPRESSION: 1. No acute intracranial abnormality. 2. Mild mucosal disease in the ethmoid air cells and left sphenoid sinus. Electronically Signed   By: Orvan Falconer M.D.   On: 12/10/2022 12:20   MR Angiogram Head Wo Contrast  Result Date: 11/20/2022 CLINICAL DATA:  Vision loss. Blurred vision. Headache over the last 2 months. EXAM: MRA HEAD WITHOUT CONTRAST TECHNIQUE: Angiographic images of the Circle of Willis were acquired using MRA technique without intravenous contrast. COMPARISON:  MRI 06/27/2022 FINDINGS: Anterior circulation: Both internal carotid arteries are widely patent through the skull base and siphon regions. No large vessel occlusion or proximal stenosis. No aneurysm or vascular malformation. Fetal origin of both posterior cerebral arteries from the anterior circulation. Posterior circulation: Both vertebral arteries are patent to the basilar. No basilar stenosis.  Posterior circulation branch vessels are normal. As noted above, fetal origin of both posterior cerebral arteries. Anatomic variants: None other significant. Other: None. IMPRESSION: Normal intracranial MR angiography of the large and medium size vessels. Fetal origin of both posterior cerebral arteries from the anterior circulation, a common normal variant. No vascular abnormality seen to explain the clinical presentation. Electronically Signed   By: Paulina Fusi M.D.   On: 11/20/2022 11:43    Microbiology: Results for orders placed or performed during the hospital encounter of 12/28/19  SARS CORONAVIRUS 2 (TAT 6-24 HRS) Nasopharyngeal Nasopharyngeal Swab     Status: None   Collection Time:  12/28/19 12:44 PM   Specimen: Nasopharyngeal Swab  Result Value Ref Range Status   SARS Coronavirus 2 NEGATIVE NEGATIVE Final    Comment: (NOTE) SARS-CoV-2 target nucleic acids are NOT DETECTED.  The SARS-CoV-2 RNA is generally detectable in upper and lower respiratory specimens during the acute phase of infection. Negative results do not preclude SARS-CoV-2 infection, do not rule out co-infections with other pathogens, and should not be used as the sole basis for treatment or other patient management decisions. Negative results must be combined with clinical observations, patient history, and epidemiological information. The expected result is Negative.  Fact Sheet for Patients: HairSlick.no  Fact Sheet for Healthcare Providers: quierodirigir.com  This test is not yet approved or cleared by the [place] FDA and  has been authorized for detection and/or diagnosis of SARS-CoV-2 by FDA under an Emergency Use Authorization (EUA). This EUA will remain  in effect (meaning this test can be used) for the duration of the COVID-19 declaration under Se ction 564(b)(1) of the Act, 21 U.S.C. section 360bbb-3(b)(1), unless the authorization is terminated or revoked sooner.  Performed at [facility], [address], [place], [place] [address]     Labs: CBC: Recent Labs  Lab 12/10/22 1035 12/11/22 0453 12/12/22 0437  WBC 10.1 7.4 7.1  NEUTROABS  --   --  3.9  HGB 16.2 15.3 15.8  HCT 46.4 43.9 45.2  MCV 90.4 90.0 90.4  PLT 163 162 159   Basic Metabolic Panel: Recent Labs  Lab 12/10/22 1035 12/11/22 0453 12/12/22 0437  NA 138 139 141  K 3.5 4.1 3.8  CL 107 110 106  CO2 24 26 26   GLUCOSE 125* 93 88  BUN 20 19 24*  CREATININE 1.27* 1.18 1.38*  CALCIUM 8.6* 8.7* 9.1     Discharge time spent: greater than 30 minutes.  Signed: Enedina Finner, MD [facility] 12/12/2022

## 2022-12-23 ENCOUNTER — Ambulatory Visit (INDEPENDENT_AMBULATORY_CARE_PROVIDER_SITE_OTHER): Payer: Medicare Other | Admitting: Internal Medicine

## 2022-12-23 ENCOUNTER — Telehealth: Payer: Self-pay

## 2022-12-23 ENCOUNTER — Encounter: Payer: Self-pay | Admitting: Internal Medicine

## 2022-12-23 VITALS — BP 130/78 | Ht 69.0 in | Wt 247.4 lb

## 2022-12-23 DIAGNOSIS — H539 Unspecified visual disturbance: Secondary | ICD-10-CM

## 2022-12-23 DIAGNOSIS — R42 Dizziness and giddiness: Secondary | ICD-10-CM | POA: Diagnosis not present

## 2022-12-23 DIAGNOSIS — R519 Headache, unspecified: Secondary | ICD-10-CM

## 2022-12-23 NOTE — Patient Instructions (Signed)
Dizziness Dizziness is a common problem. It makes you feel unsteady or light-headed. You may feel like you are about to pass out (faint). Dizziness can lead to getting hurt if you stumble or fall. Dizziness can be caused by many things, including: Medicines. Not having enough water in your body (dehydration). Illness. Follow these instructions at home: Eating and drinking  Drink enough fluid to keep your pee (urine) pale yellow. This helps to keep you from getting dehydrated. Try to drink more clear fluids, such as water. Do not drink alcohol. Limit how much caffeine you drink or eat, if your doctor tells you to do that. Limit how much salt (sodium) you drink or eat, if your doctor tells you to do that. Activity  Avoid making quick movements. Stand up slowly from sitting in a chair, and steady yourself until you feel okay. In the morning, first sit up on the side of the bed. When you feel okay, stand up slowly while you hold onto something. Do this until you know that your balance is okay. If you need to stand in one place for a long time, move your legs often. Tighten and relax the muscles in your legs while you are standing. Do not drive or use machinery if you feel dizzy. Avoid bending down if you feel dizzy. Place items in your home so you can reach them easily without leaning over. Lifestyle Do not smoke or use any products that contain nicotine or tobacco. If you need help quitting, ask your doctor. Try to lower your stress level. You can do this by using methods such as yoga or meditation. Talk with your doctor if you need help. General instructions Watch your dizziness for any changes. Take over-the-counter and prescription medicines only as told by your doctor. Talk with your doctor if you think that you are dizzy because of a medicine that you are taking. Tell a friend or a family member that you are feeling dizzy. If he or she notices any changes in your behavior, have this  person call your doctor. Keep all follow-up visits. Contact a doctor if: Your dizziness does not go away. Your dizziness or light-headedness gets worse. You feel like you may vomit (are nauseous). You have trouble hearing. You have new symptoms. You are unsteady on your feet. You feel like the room is spinning. You have neck pain or a stiff neck. You have a fever. Get help right away if: You vomit or have watery poop (diarrhea), and you cannot eat or drink anything. You have trouble: Talking. Walking. Swallowing. Using your arms, hands, or legs. You feel generally weak. You are not thinking clearly, or you have trouble forming sentences. A friend or family member may notice this. You have: Chest pain. Pain in your belly (abdomen). Shortness of breath. Sweating. Your vision changes. You are bleeding. You have a very bad headache. These symptoms may be an emergency. Get help right away. Call your local emergency services (911 in the [place] Do not wait to see if the symptoms will go away. Do not drive yourself to the hospital. Summary Dizziness makes you feel unsteady or light-headed. You may feel like you are about to pass out (faint). Drink enough fluid to keep your pee (urine) pale yellow. Do not drink alcohol. Avoid making quick movements if you feel dizzy. Watch your dizziness for any changes. This information is not intended to replace advice given to you by your health care provider. Make sure you discuss any questions   you have with your health care provider. Document Revised: 12/10/2019 Document Reviewed: 12/13/2019 Elsevier Patient Education  2024 Elsevier Inc.  

## 2022-12-23 NOTE — Progress Notes (Signed)
Subjective:    Patient ID: Richard [name], male    DOB: Feb 20, 1960, 62 y.o.   MRN: [id number]  HPI  Patient presents to clinic today for hospital follow-up.  He presented to the ER 11/19 with complaint of vision loss.  He was admitted.  CT head showed some mucosal sinus disease but otherwise negative for acute findings.  MRI brain did not show any acute findings.  Carotid ultrasounds were negative for carotid stenosis.  CT of the head and neck did not show any evidence of acute findings.  He was diagnosed with vertigo versus vestibular migraines.  He was advised not to drive and to undergo vestibular rehab however he wanted to wait for further evaluation from neurology.  He was discharged 11/21, advised to follow-up with ophthalmology, neurology and ENT.  Since that time, he has already followed up with ENT. He was advised that he did not have an ENT problem. He reports that he has not had a spell in 6 days. He attributes this to not wearing his glasses. He has been having headaches in his temples and behind his eyes. He describes the pain as pressure. He has had some dizziness or lightheadedness. He sees the neurologist December 17th.  Prior to the recent hospitalization, the patient reported recurrent episodes of headache, lightheadedness, and transient bilateral vision loss since May. The episodes, which last approximately 3-4 minutes, are characterized by a total loss of vision that resolves spontaneously. The patient reports experiencing about 10-12 of these episodes since onset, with no identifiable triggers. The episodes occur in various settings, including while driving and watching television.  The patient underwent an MRI in June, which was reportedly normal. He was subsequently evaluated by an ophthalmologist who performed a bilateral iridotomy, but this did not result in any improvement in symptoms.   The patient was also evaluated by an ENT specialist for potential vertigo. The ENT  performed hearing tests and other unspecified tests. The patient reports being told by the ENT specialist that his optokinetic's were abnormal and requested that he had an additional MRI.   Review of Systems     Past Medical History:  Diagnosis Date   Arthritis    Arthrofibrosis of total knee arthroplasty (HCC) 05/30/2015   Bilateral pulmonary embolism (HCC)    a. 03/2015 CTA: acute bilat PE; b. IVC filter placed in 2017--> removed 2018.   Chest pain    a. 2015 Abnl MV;  b. 2015 Cath: nl cors.   Chronic diastolic CHF (congestive heart failure) (HCC)    a. 03/2015 Echo: EF 55-65%, poor windows, mildly dil RV.   DVT (deep venous thrombosis) (HCC)    a. 03/2015 U/S: occlusive DVT w/in the distal aspect of the Left fem vein through the L popliteal vein.   Dyspnea    On exertion   GERD (gastroesophageal reflux disease)    Headache    Hypertension    Hypertensive heart disease    Obesity    OSA (obstructive sleep apnea)    uses CPAP nightly settings at 15    PAF (paroxysmal atrial fibrillation) (HCC)    Pneumonia    hx of years ago    Primary localized osteoarthritis of left knee    a. 11/2014 s/p L TKA.   Primary localized osteoarthritis of right knee    a. 02/2014 s/p R Partial Knee arthroplasty.   Stiffness of left knee    a. 12/2014 s/p manipulation under anesthesia.   Urine incontinence  Current Outpatient Medications  Medication Sig Dispense Refill   allopurinol (ZYLOPRIM) 100 MG tablet TAKE ONE TABLET (100 MG TOTAL) BY MOUTH DAILY. 90 tablet 0   baclofen (LIORESAL) 10 MG tablet Take 1 tablet (10 mg total) by mouth 2 (two) times daily. 180 each 1   colchicine 0.6 MG tablet TAKE ONE TABLET BY MOUTH TWICE A DAY 180 tablet 2   ELIQUIS 5 MG TABS tablet TAKE ONE TABLET BY MOUTH TWO TIMES DAILY 180 tablet 1   flecainide (TAMBOCOR) 100 MG tablet Take (1) one tablet by mouth (2) twice daily 180 tablet 2   furosemide (LASIX) 20 MG tablet TAKE ONE TABLET (20 MG TOTAL) BY MOUTH  DAILY AS NEEDED. 90 tablet 0   Multiple Vitamins-Minerals (MULTIVITAMIN WITH MINERALS) tablet Take 1 tablet by mouth daily.     pantoprazole (PROTONIX) 40 MG tablet TAKE ONE TABLET BY MOUTH ONCE A DAY 90 tablet 2   Sennosides-Docusate Sodium (SM STOOL SOFTENER/LAXATIVE PO) Take 8.6 mg by mouth daily.     topiramate (TOPAMAX) 25 MG tablet Take 1 tablet (25 mg total) by mouth at bedtime. 90 tablet 0   traMADol (ULTRAM) 50 MG tablet TAKE ONE TABLET (50 MG TOTAL) BY MOUTH DAILY AS NEEDED. 30 tablet 0   No current facility-administered medications for this visit.    Allergies  Allergen Reactions   Penicillins Anaphylaxis and Other (See Comments)    From childhood; uncertain of reaction Has patient had a PCN reaction causing immediate rash, facial/tongue/throat swelling, SOB or lightheadedness with hypotension: Unk Has patient had a PCN reaction causing severe rash involving mucus membranes or skin necrosis: Unk Has patient had a PCN reaction that required hospitalization: Unk Has patient had a PCN reaction occurring within the last 10 years: No If all of the above answers are "NO", then may proceed with Cephalosporin use.   Vancomycin Other (See Comments)    Red Mans' syndrome   Penicillins Other (See Comments)    Childhood   Vancomycin Other (See Comments)    Red man Syndrome    Family History  Problem Relation Age of Onset   Coronary artery disease Mother    Hyperlipidemia Mother    Hypertension Mother    Arthritis Mother    Coronary artery disease Father    Heart disease Paternal Grandmother    Alzheimer's disease Paternal Grandfather     Social History   Socioeconomic History   Marital status: Married    Spouse name: Delfino Semo   Number of children: 2   Years of education: 12   Highest education level: Not on file  Occupational History   Occupation: Acupuncturist: Engineer, civil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) [employer]  Tobacco Use   Smoking status: Never   Smokeless tobacco: Never  Vaping Use   Vaping  status: Never Used  Substance and Sexual Activity   Alcohol use: Not Currently    Comment: \   Drug use: No   Sexual activity: Yes  Other Topics Concern   Not on file  Social History Narrative   ** Merged History Encounter **       Social Determinants of Health   Financial Resource Strain: Low Risk  (08/08/2022)   Overall Financial Resource Strain (CARDIA)    Difficulty of Paying Living Expenses: Not hard at all  Food Insecurity: No Food Insecurity (12/10/2022)   Hunger Vital Sign    Worried About Running Out of Food in the Last Year: Never true    Ran Out of  Food in the Last Year: Never true  Transportation Needs: No Transportation Needs (12/10/2022)   PRAPARE - Administrator, Civil Service (Medical): No    Lack of Transportation (Non-Medical): No  Physical Activity: Sufficiently Active (08/08/2022)   Exercise Vital Sign    Days of Exercise per Week: 7 days    Minutes of Exercise per Session: 40 min  Stress: No Stress Concern Present (08/08/2022)   [employer] of Occupational Health - Occupational Stress Questionnaire    Feeling of Stress : Not at all  Social Connections: Moderately Integrated (08/08/2022)   Social Connection and Isolation Panel [NHANES]    Frequency of Communication with Friends and Family: More than three times a week    Frequency of Social Gatherings with Friends and Family: More than three times a week    Attends Religious Services: 1 to 4 times per year    Active Member of [employer] or Organizations: No    Attends Banker Meetings: Never    Marital Status: Married  Catering manager Violence: Not At Risk (12/10/2022)   Humiliation, Afraid, Rape, and Kick questionnaire    Fear of Current or Ex-Partner: No    Emotionally Abused: No    Physically Abused: No    Sexually Abused: No     Constitutional: Patient reports intermittent headaches.  Denies fever, malaise, fatigue, or abrupt weight changes.  HEENT: Patient reports  intermittent  vision loss.  Denies eye pain, eye redness, ear pain, ringing in the ears, wax buildup, runny nose, nasal congestion, bloody nose, or sore throat. Respiratory: Denies difficulty breathing, shortness of breath, cough or sputum production.   Cardiovascular: Denies chest pain, chest tightness, palpitations or swelling in the hands or feet.  Gastrointestinal: Denies abdominal pain, bloating, constipation, diarrhea or blood in the stool.  GU: Denies urgency, frequency, pain with urination, burning sensation, blood in urine, odor or discharge. Musculoskeletal: Patient reports joint pain.  Denies decrease in range of motion, difficulty with gait, muscle pain or joint swelling.  Skin: Denies redness, rashes, lesions or ulcercations.  Neurological: Patient reports intermittent dizziness.  Denies difficulty with memory, difficulty with speech or problems with balance and coordination.  Psych: Denies anxiety, depression, SI/HI.  No other specific complaints in a complete review of systems (except as listed in HPI above).  Objective:   Physical Exam BP 130/78   Ht 5\' 9"  (1.753 m)   Wt 247 lb 6.4 oz (112.2 kg)   BMI 36.53 kg/m    Wt Readings from Last 3 Encounters:  12/10/22 244 lb 14.9 oz (111.1 kg)  11/19/22 245 lb (111.1 kg)  11/08/22 245 lb (111.1 kg)    General: Appears his stated age, obese, in NAD. HEENT: Head: normal shape and size; Eyes: sclera white, no icterus, conjunctiva pink, PERRLA and EOMs intact; Ears: Tm's gray and intact, normal light reflex;  Cardiovascular: Normal rate and rhythm. S1,S2 noted.  No murmur, rubs or gallops noted. No JVD or BLE edema. No carotid bruits noted. Pulmonary/Chest: Normal effort and positive vesicular breath sounds. No respiratory distress. No wheezes, rales or ronchi noted.  Musculoskeletal: Normal flexion, extension, rotation and lateral bending of the cervical spine.  Strength 5/5 BUE/BLE.  No difficulty with gait.  Neurological: Alert  and oriented. Cranial nerves II-XII grossly intact. Coordination normal.    BMET    Component Value Date/Time   NA 141 12/12/2022 0437   NA 145 (H) 02/05/2021 0909   K 3.8 12/12/2022 0437   CL  106 12/12/2022 0437   CO2 26 12/12/2022 0437   GLUCOSE 88 12/12/2022 0437   BUN 24 (H) 12/12/2022 0437   BUN 16 02/05/2021 0909   CREATININE 1.38 (H) 12/12/2022 0437   CREATININE 1.32 11/08/2022 0948   CALCIUM 9.1 12/12/2022 0437   GFRNONAA 58 (L) 12/12/2022 0437   GFRAA >60 10/19/2019 1337   GFRAA >60 10/19/2019 1337    Lipid Panel     Component Value Date/Time   CHOL 157 11/08/2022 0948   CHOL 143 02/05/2021 0909   TRIG 71 11/08/2022 0948   HDL 47 11/08/2022 0948   HDL 48 02/05/2021 0909   CHOLHDL 3.3 11/08/2022 0948   VLDL 22.4 01/17/2020 1214   LDLCALC 94 11/08/2022 0948    CBC    Component Value Date/Time   WBC 7.1 12/12/2022 0437   RBC 5.00 12/12/2022 0437   HGB 15.8 12/12/2022 0437   HGB 16.2 02/05/2021 0909   HCT 45.2 12/12/2022 0437   HCT 45.5 02/05/2021 0909   PLT 159 12/12/2022 0437   PLT 195 02/05/2021 0909   MCV 90.4 12/12/2022 0437   MCV 88 02/05/2021 0909   MCH 31.6 12/12/2022 0437   MCHC 35.0 12/12/2022 0437   RDW 12.9 12/12/2022 0437   RDW 12.6 02/05/2021 0909   LYMPHSABS 2.1 12/12/2022 0437   LYMPHSABS 1.6 10/31/2017 1438   MONOABS 0.7 12/12/2022 0437   EOSABS 0.3 12/12/2022 0437   EOSABS 0.2 10/31/2017 1438   BASOSABS 0.1 12/12/2022 0437   BASOSABS 0.0 10/31/2017 1438    Hgb A1C Lab Results  Component Value Date   HGBA1C 5.1 11/08/2022           Assessment & Plan:   Assessment and Plan    Transient Vision Loss, Headaches, Dizziness Episodes of transient bilateral vision loss since May, lasting 3-4 minutes, with a total of 10-12 episodes. No clear triggers identified. Initial MRI in June was normal.  No improvement status post bilateral iridotomy by ophthalmology.  ENT testing was positive for abnormal optic otic kinetics. Neurology  consult scheduled but not until December. [facility] notes, labs and imaging reviewed -Recent improvement noticed when not wearing eyeglasses, consider follow-up with ophthalmology -Keep your scheduled neurology appointment on December 17    RTC in 6 months for your annual exam Nicki Reaper, NP

## 2022-12-23 NOTE — Telephone Encounter (Signed)
Copied from CRM [id number]. Topic: General - Other >> Dec 23, 2022  3:45 PM Marlow Baars wrote: Reason for CRM: The patient saw his provider today and was told to call back to let her know the name of the medicine he was taking is called Topiramate 90 pills 25 mg. He wants to know if he ius supposed to take it as it is written 1 at bedtime or what you mentioned to his wife and that is one every other night. Please assist patient further.

## 2022-12-24 NOTE — Telephone Encounter (Signed)
That medication is once at bedtime as directed.  I believe I had sent him in Nurtec to try every other day for the headaches but I do not see that on his list and him not sure if he ever picked up that prescription.

## 2022-12-24 NOTE — Telephone Encounter (Signed)
It sounds like he doesn't want to take it

## 2023-01-07 DIAGNOSIS — H5500 Unspecified nystagmus: Secondary | ICD-10-CM | POA: Diagnosis not present

## 2023-01-07 DIAGNOSIS — H532 Diplopia: Secondary | ICD-10-CM | POA: Diagnosis not present

## 2023-01-07 DIAGNOSIS — H579 Unspecified disorder of eye and adnexa: Secondary | ICD-10-CM | POA: Diagnosis not present

## 2023-01-07 DIAGNOSIS — R519 Headache, unspecified: Secondary | ICD-10-CM | POA: Diagnosis not present

## 2023-01-07 DIAGNOSIS — H539 Unspecified visual disturbance: Secondary | ICD-10-CM | POA: Diagnosis not present

## 2023-01-07 DIAGNOSIS — R42 Dizziness and giddiness: Secondary | ICD-10-CM | POA: Diagnosis not present

## 2023-01-08 ENCOUNTER — Telehealth: Payer: Self-pay | Admitting: Cardiovascular Disease

## 2023-01-08 NOTE — Telephone Encounter (Signed)
Dr. Malvin Johns would like to know if the patient can be on::  Nortriptyline: 10 mg at night for week and increase to 20 mg a night the following week

## 2023-01-08 NOTE — Telephone Encounter (Signed)
New Message:     Dr Malvin Johns wants to start patient on this new medicine. He wants to know what Dr Kirke Corin thinks about patient taking this medicine?   Pt c/o medication issue:  1. Name of Medication: Nortrittyline  2. How are you currently taking this medication (dosage and times per day)? 10 mg at night for week and increase to 20 mg a night the following week  3. Are you having a reaction (difficulty breathing--STAT)?   4. What is your medication issue? Dr Malvin Johns wants to know if Dr Kirke Corin think this is alright

## 2023-01-09 NOTE — Telephone Encounter (Signed)
There is no absolute contraindication from a cardiac standpoint but slight increase in risk as outlined by Richard [name]. He can start nortriptyline but he will have to have a follow-up EKG within 1 week of starting this medication to ensure stability.  He is overdue for follow-up anyway.

## 2023-01-09 NOTE — Telephone Encounter (Signed)
Left a message for the patient to call back.  

## 2023-01-09 NOTE — Telephone Encounter (Signed)
Concurrent use of CLASS I ANTIARRHYTHMIC AGENTS (flecainide) and TRICYCLIC ANTIDEPRESSANTS may result in an increased risk of cardiotoxicity (QT prolongation, torsades de pointes, cardiac arrest).   Concurrent use of TRAMADOL and SEROTONERGIC AGENTS WITH ANTICHOLINERGIC PROPERTIES may result in increased risk of paralytic ileus and an increased risk of serotonin syndrome.  Cardiovascular: Efficacy of certain antihypertensive agents (eg, guanethidine) may be reduced; sinus tachycardia, prolonged cardiac conduction time, myocardial infarction, arrhythmia, and strokes have occurred; risk may be increased in patients with cardiovascular disease [3][4]

## 2023-01-09 NOTE — Telephone Encounter (Signed)
Patient is requesting update in regards to this medication today. He also states if he doesn't hear anything today he will be looking for another doctor. Please advise.

## 2023-01-10 NOTE — Telephone Encounter (Signed)
Call placed to Dr. Daisy Blossom office. They have been advised of the below contraindications. They stated that they will call the patient and let him know that he will need an EKG one week after starting the medication.  The patient has been called as well. He was very frustrated that it took a week for a phone call back. He has been advised that it has not been a week but that we were contacted on Wednesday and that we tried to call him back the next day. He stated that he was too angry to answer the call.   An appointment has been made for 12/26 per the patient request. He stated that he will probably be leaving [facility] and [facility].

## 2023-01-13 ENCOUNTER — Telehealth: Payer: Self-pay | Admitting: Cardiovascular Disease

## 2023-01-13 NOTE — Telephone Encounter (Signed)
Pt c/o medication issue:  1. Name of Medication: Nortrittyline   2. How are you currently taking this medication (dosage and times per day)?    3. Are you having a reaction (difficulty breathing--STAT)? no  4. What is your medication issue? Patient didn't get medication until today. Calling to see if he can still keep his appt on 12/26. Please advise

## 2023-01-13 NOTE — Telephone Encounter (Signed)
Spoke to patient and he stated that he just picked up the medication after our practice "okayed" it on Wednesday. Informed patient that he is overdue for a follow-up so to keep appointment as scheduled. Patient understood with read back

## 2023-01-16 ENCOUNTER — Encounter: Payer: Self-pay | Admitting: Cardiovascular Disease

## 2023-01-16 ENCOUNTER — Ambulatory Visit: Payer: Medicare Other | Attending: Cardiovascular Disease | Admitting: Cardiovascular Disease

## 2023-01-16 VITALS — BP 128/72 | HR 70 | Ht 69.0 in | Wt 252.1 lb

## 2023-01-16 DIAGNOSIS — Z86711 Personal history of pulmonary embolism: Secondary | ICD-10-CM

## 2023-01-16 DIAGNOSIS — G4733 Obstructive sleep apnea (adult) (pediatric): Secondary | ICD-10-CM

## 2023-01-16 DIAGNOSIS — I480 Paroxysmal atrial fibrillation: Secondary | ICD-10-CM

## 2023-01-16 NOTE — Patient Instructions (Signed)

## 2023-01-16 NOTE — Progress Notes (Signed)
Cardiology Office Note   Date:  01/16/2023   ID:  Richard [name], DOB March 04, 1960, MRN [id number]  PCP:  Lorre Munroe, NP  Cardiologist:   Lorine Bears, MD   Chief Complaint  Patient presents with   Follow-up    OD 6 month f/u pt would like to discuss Nortriptyline. Meds reviewed verbally with pt.      History of Present Illness: Richard [name] is a 62 y.o. male who presents for a follow-up visit regarding paroxysmal atrial fibrillation . The patient has past medical history of essential hypertension, obesity status post gastric sleeve surgery, osteoarthritis status post bilateral knee surgeries.  Previous cardiac catheterization 2015 showed normal coronary arteries.  The patient had multiple knee surgeries starting in 2016. He developed left lower extremity DVT complicated by massive pulmonary embolism which was treated with catheter-based intervention and IVC filter placement.  He had atrial fibrillation at that time but converted to sinus rhythm. He had intermittent palpitations thought to be due to atrial fibrillation and has been maintaining in sinus rhythm with flecainide. Lifelong anticoagulation has been recommended.  His atrial fibrillation is being treated with flecainide and anticoagulation.  Metoprolol was discontinued in the past due to symptomatic bradycardia.    He was hospitalized in November for blurred vision.  CTA of the neck showed no significant obstructive disease.  MRI of the brain with no abnormalities.  He was referred to ENT and neurology.  He had an echocardiogram while hospitalized which showed normal LV systolic function with no significant valvular abnormalities.  He was seen by neurology and is suspected of having ocular migraines.  He was started on nortriptyline and took 3 doses so far with improvement in symptoms.  He has been doing well from a cardiac standpoint with no chest pain, shortness of breath or palpitations.    Past Medical  History:  Diagnosis Date   Arthritis    Arthrofibrosis of total knee arthroplasty (HCC) 05/30/2015   Bilateral pulmonary embolism (HCC)    a. 03/2015 CTA: acute bilat PE; b. IVC filter placed in 2017--> removed 2018.   Chest pain    a. 2015 Abnl MV;  b. 2015 Cath: nl cors.   Chronic diastolic CHF (congestive heart failure) (HCC)    a. 03/2015 Echo: EF 55-65%, poor windows, mildly dil RV.   DVT (deep venous thrombosis) (HCC)    a. 03/2015 U/S: occlusive DVT w/in the distal aspect of the Left fem vein through the L popliteal vein.   Dyspnea    On exertion   GERD (gastroesophageal reflux disease)    Headache    Hypertension    Hypertensive heart disease    Obesity    OSA (obstructive sleep apnea)    uses CPAP nightly settings at 15    PAF (paroxysmal atrial fibrillation) (HCC)    Pneumonia    hx of years ago    Primary localized osteoarthritis of left knee    a. 11/2014 s/p L TKA.   Primary localized osteoarthritis of right knee    a. 02/2014 s/p R Partial Knee arthroplasty.   Stiffness of left knee    a. 12/2014 s/p manipulation under anesthesia.   Urine incontinence     Past Surgical History:  Procedure Laterality Date   APPENDECTOMY     CARDIAC CATHETERIZATION     CARPAL TUNNEL RELEASE Left 03/2018   CYSTOSCOPY W/ RETROGRADES  12/09/2019   Procedure: CYSTOSCOPY WITH RETROGRADE PYELOGRAM;  Surgeon: Orson Ape, MD;  Location: [facility] ORS;  Service: Urology;;   CYSTOSCOPY WITH STENT PLACEMENT Right 12/09/2019   Procedure: CYSTOSCOPY WITH STENT PLACEMENT;  Surgeon: Orson Ape, MD;  Location: [facility]S;  Service: Urology;  Laterality: Right;   EXAM UNDER ANESTHESIA WITH MANIPULATION OF KNEE Left 05/30/2015   Procedure: EXAM UNDER ANESTHESIA WITH MANIPULATION OF LEFT KNEE;  Surgeon: Teryl Lucy, MD;  [facility];  Service: Orthopedics;  Laterality: Left;   HIATAL HERNIA REPAIR N/A 10/26/2019   Procedure: HIATAL HERNIA REPAIR;  Surgeon: Luretha Murphy, MD;  [facility];  Service: General;  Laterality: N/A;   I & D KNEE WITH POLY EXCHANGE Left 10/30/2015   Procedure: IRRIGATION AND DEBRIDEMENT KNEE WITH POLY EXCHANGE PLACE SPACERS;  Surgeon: Gean Birchwood, MD;  [facility];  Service: Orthopedics;  Laterality: Left;  OFF ELIQUIST 3 DAYS   IVC FILTER PLACEMENT ([facility] HX)  04/13/15   IVC FILTER REMOVAL N/A 08/13/2016   Procedure: IVC Filter Removal;  Surgeon: Renford Dills, MD;  Location: [facility] INVASIVE CV LAB;  Service: Cardiovascular;  Laterality: N/A;   JOINT REPLACEMENT     KNEE ARTHROSCOPY Left 08/08/2015   Procedure: LEFT KNEE MANIPULATION WITH ARTHROSCOPIC LYSIS OF ADHESIONS AND ASPIRATION;  Surgeon: Teryl Lucy, MD;  [facility];  Service: Orthopedics;  Laterality: Left;   KNEE CLOSED REDUCTION Left 01/20/2015   Procedure: LEFT KNEE MANIPULATION;  Surgeon: Teryl Lucy, MD;  Location: [facility];  Service: Orthopedics;  Laterality: Left;   LAPAROSCOPIC GASTRIC SLEEVE RESECTION N/A 10/26/2019   Procedure: LAPAROSCOPIC SLEEVE GASTRECTOMY;  Surgeon: Luretha Murphy, MD;  [facility];  Service: General;  Laterality: N/A;   LEFT HEART CATHETERIZATION WITH CORONARY ANGIOGRAM N/A 01/12/2014   Procedure: LEFT HEART CATHETERIZATION WITH CORONARY ANGIOGRAM;  Surgeon: Corky Crafts, MD;  Location: [facility] C[facility] LAB;  Service: Cardiovascular;  Laterality: N/A;   ORTHOPEDIC SURGERY Left    arthroscopy x3   PARTIAL KNEE ARTHROPLASTY Right 02/25/2014   Procedure: RIGHT KNEE ARTHROPLASTY CONDYLE AND PLATEAU MEDIAL COMPARTMENT ;  Surgeon: Eulas Post, MD;  Location: [facility];  Service: Orthopedics;  Laterality: Right;   PERIPHERAL VASCULAR CATHETERIZATION N/A 03/29/2015   Procedure: IVC Filter Insertion, and possible thrombectomy;  Surgeon: Renford Dills, MD;  Location: [facility];  Service: Cardiovascular;  Laterality: N/A;   ROTATOR CUFF REPAIR Left    TOTAL KNEE ARTHROPLASTY  12/06/2014   Procedure: TOTAL  KNEE ARTHROPLASTY;  Surgeon: Teryl Lucy, MD;  [facility];  Service: Orthopedics;;   TOTAL KNEE REVISION Left 02/08/2016   Procedure: TOTAL KNEE REVISION;  Surgeon: Gean Birchwood, MD;  [facility];  Service: Orthopedics;  Laterality: Left;   UPPER GI ENDOSCOPY N/A 10/26/2019   Procedure: UPPER GI ENDOSCOPY;  Surgeon: Luretha Murphy, MD;  [facility];  Service: General;  Laterality: N/A;   URETEROSCOPY WITH HOLMIUM LASER LITHOTRIPSY Right 12/30/2019   Procedure: URETEROSCOPY WITH HOLMIUM LASER LITHOTRIPSY;  Surgeon: Orson Ape, MD;  Location: [facility] OR[facility];  Service: Urology;  Laterality: Right;     Current Outpatient Medications  Medication Sig Dispense Refill   allopurinol (ZYLOPRIM) 100 MG tablet TAKE ONE TABLET (100 MG TOTAL) BY MOUTH DAILY. 90 tablet 0   baclofen (LIORESAL) 10 MG tablet Take 1 tablet (10 mg total) by mouth 2 (two) times daily. 180 each 1   colchicine 0.6 MG tablet TAKE ONE TABLET BY MOUTH TWICE A DAY 180 tablet 2   flecainide (TAMBOCOR) 100 MG  tablet Take (1) one tablet by mouth (2) twice daily 180 tablet 2   furosemide (LASIX) 20 MG tablet TAKE ONE TABLET (20 MG TOTAL) BY MOUTH DAILY AS NEEDED. 90 tablet 0   Multiple Vitamins-Minerals (MULTIVITAMIN WITH MINERALS) tablet Take 1 tablet by mouth daily.     nortriptyline (PAMELOR) 10 MG capsule Take 10 mg  nightly for 1 week, then increase to 20 mg nightly. PLEASE MAKE SURE TO KEEP CARDIO APPT for EKG.     pantoprazole (PROTONIX) 40 MG tablet TAKE ONE TABLET BY MOUTH ONCE A DAY 90 tablet 2   Sennosides-Docusate Sodium (SM STOOL SOFTENER/LAXATIVE PO) Take 8.6 mg by mouth daily.     topiramate (TOPAMAX) 25 MG tablet Take 1 tablet (25 mg total) by mouth at bedtime. 90 tablet 0   traMADol (ULTRAM) 50 MG tablet TAKE ONE TABLET (50 MG TOTAL) BY MOUTH DAILY AS NEEDED. 30 tablet 0   ELIQUIS 5 MG TABS tablet TAKE ONE TABLET BY MOUTH TWO TIMES DAILY (Patient not taking: Reported on 01/16/2023) 180 tablet 1   No current  facility-administered medications for this visit.    Allergies:   Penicillins, Vancomycin, Penicillins, and Vancomycin    Social History:  The patient  reports that he has never smoked. He has never used smokeless tobacco. He reports that he does not currently use alcohol. He reports that he does not use drugs.   Family History:  The patient's family history includes Alzheimer's disease in his paternal grandfather; Arthritis in his mother; Coronary artery disease in his father and mother; Heart disease in his paternal grandmother; Hyperlipidemia in his mother; Hypertension in his mother.    ROS:  Please see the history of present illness.   Otherwise, review of systems are positive for none.   All other systems are reviewed and negative.    PHYSICAL EXAM: VS:  BP 128/72 (BP Location: Left Arm, Patient Position: Sitting, Cuff Size: Large)   Pulse 70   Ht 5\' 9"  (1.753 m)   Wt 252 lb 2 oz (114.4 kg)   SpO2 98%   BMI 37.23 kg/m  , BMI Body mass index is 37.23 kg/m. GEN: Well nourished, well developed, in no acute distress  HEENT: normal  Neck: no JVD, carotid bruits, or masses Cardiac: RRR; no murmurs, rubs, or gallops,no edema  Respiratory:  clear to auscultation bilaterally, normal work of breathing GI: soft, nontender, nondistended, + BS MS: no deformity or atrophy  Skin: warm and dry, no rash Neuro:  Strength and sensation are intact Psych: euthymic mood, full affect   EKG:  EKG is ordered today. The ekg ordered today demonstrates : Normal sinus rhythm Pulmonary disease pattern Left anterior fascicular block When compared with ECG of 10-Dec-2022 10:40, No significant change was found    Recent Labs: 11/08/2022: ALT 41 12/12/2022: BUN 24; Creatinine, Ser 1.38; Hemoglobin 15.8; Platelets 159; Potassium 3.8; Sodium 141    Lipid Panel    Component Value Date/Time   CHOL 157 11/08/2022 0948   CHOL 143 02/05/2021 0909   TRIG 71 11/08/2022 0948   HDL 47 11/08/2022 0948    HDL 48 02/05/2021 0909   CHOLHDL 3.3 11/08/2022 0948   VLDL 22.4 01/17/2020 1214   LDLCALC 94 11/08/2022 0948      Wt Readings from Last 3 Encounters:  01/16/23 252 lb 2 oz (114.4 kg)  12/23/22 247 lb 6.4 oz (112.2 kg)  12/10/22 244 lb 14.9 oz (111.1 kg)       ASSESSMENT AND  PLAN:  1.  Paroxysmal atrial fibrillation:  No evidence of recurrent arrhythmia on flecainide. He is tolerating anticoagulation with Eliquis.  He was started on nortriptyline recently his EKG appears to be unchanged.  QT interval is still the same.  2. history of pulmonary embolism: The patient is on lifelong anticoagulation for this as well as paroxysmal atrial fibrillation.  He ran out of Eliquis recently.  I advised him to resume this as soon as possible.  3.  Status post gastric sleeve surgery for morbid obesity: His weight is stable overall.  4.  Obstructive sleep apnea: He is compliant with CPAP at night.   Disposition:   FU with me in 6 months  Signed,  Lorine Bears, MD  01/16/2023 3:00 PM    [facility]

## 2023-01-17 ENCOUNTER — Ambulatory Visit: Payer: Medicare Other | Admitting: Internal Medicine

## 2023-01-28 ENCOUNTER — Other Ambulatory Visit: Payer: Self-pay | Admitting: Cardiovascular Disease

## 2023-01-28 DIAGNOSIS — I480 Paroxysmal atrial fibrillation: Secondary | ICD-10-CM

## 2023-01-28 DIAGNOSIS — I2699 Other pulmonary embolism without acute cor pulmonale: Secondary | ICD-10-CM

## 2023-01-28 DIAGNOSIS — I82492 Acute embolism and thrombosis of other specified deep vein of left lower extremity: Secondary | ICD-10-CM

## 2023-01-28 DIAGNOSIS — R0789 Other chest pain: Secondary | ICD-10-CM

## 2023-01-30 NOTE — Telephone Encounter (Signed)
 Last office visit:  01/16/23 with plan to f/u  in 6 months  Next office visit:  07/15/23

## 2023-02-01 ENCOUNTER — Other Ambulatory Visit: Payer: Self-pay | Admitting: Internal Medicine

## 2023-02-04 NOTE — Telephone Encounter (Signed)
 Requested Prescriptions  Pending Prescriptions Disp Refills   allopurinol  (ZYLOPRIM ) 100 MG tablet [Pharmacy Med Name: ALLOPURINOL  100MG  TABLET] 90 tablet 2    Sig: TAKE ONE TABLET (100 MG TOTAL) BY MOUTH DAILY.     Endocrinology:  Gout Agents - allopurinol  Failed - 02/04/2023  8:37 AM      Failed - Cr in normal range and within 360 days    Creat  Date Value Ref Range Status  11/08/2022 1.32 0.70 - 1.35 mg/dL Final   Creatinine, Ser  Date Value Ref Range Status  12/12/2022 1.38 (H) 0.61 - 1.24 mg/dL Final         Passed - Uric Acid in normal range and within 360 days    Uric Acid, Serum  Date Value Ref Range Status  11/08/2022 7.2 4.0 - 8.0 mg/dL Final    Comment:    Therapeutic target for gout patients: <6.0 mg/dL .          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, unspecified laterality   [place] [facility] [place], Angeline ORN, NP   4 months ago Sore throat   [place] [facility] [place], Angeline ORN, NP   7 months ago Lightheadedness   [facility] [place], Angeline ORN, NP       Future Appointments             In 3 months Baity, Angeline ORN, NP [facility] [facility], [facility]   In 5 months Darron Deatrice LABOR, MD [facility] at [facility] - CBC within normal limits and completed in the last 12 months    WBC  Date Value Ref Range Status  12/12/2022 7.1 4.0 - 10.5 K/uL Final   RBC  Date Value Ref Range Status  12/12/2022 5.00 4.22 - 5.81 MIL/uL Final   Hemoglobin  Date Value Ref Range Status  12/12/2022 15.8 13.0 - 17.0 g/dL  Final  98/83/7976 83.7 13.0 - 17.7 g/dL Final   HCT  Date Value Ref Range Status  12/12/2022 45.2 39.0 - 52.0 % Final   Hematocrit  Date Value Ref Range Status  02/05/2021 45.5 37.5 - 51.0 % Final   MCHC  Date Value Ref Range Status  12/12/2022 35.0 30.0 - 36.0 g/dL Final   [facility]  Date Value Ref Range Status  12/12/2022 31.6 26.0 - 34.0 pg Final   MCV  Date Value Ref Range Status  12/12/2022 90.4 80.0 - 100.0 fL Final  02/05/2021 88 79 - 97 fL Final   No results found for: PLTCOUNTKUC, LABPLAT, POCPLA RDW  Date Value Ref Range Status  12/12/2022 12.9 11.5 - 15.5 % Final  02/05/2021 12.6 11.6 - 15.4 % Final

## 2023-02-16 DIAGNOSIS — R569 Unspecified convulsions: Secondary | ICD-10-CM | POA: Diagnosis not present

## 2023-02-17 ENCOUNTER — Other Ambulatory Visit: Payer: Self-pay | Admitting: Internal Medicine

## 2023-02-19 NOTE — Telephone Encounter (Signed)
Requested Prescriptions  Pending Prescriptions Disp Refills   topiramate (TOPAMAX) 25 MG tablet [Pharmacy Med Name: TOPIRAMATE 25MG  TABLET] 90 tablet 0    Sig: TAKE ONE TABLET (25 MG TOTAL) BY MOUTH AT BEDTIME.     Neurology: Anticonvulsants - topiramate & zonisamide Failed - 02/19/2023  1:57 PM      Failed - Cr in normal range and within 360 days    Creat  Date Value Ref Range Status  11/08/2022 1.32 0.70 - 1.35 mg/dL Final   Creatinine, Ser  Date Value Ref Range Status  12/12/2022 1.38 (H) 0.61 - 1.24 mg/dL Final         Passed - CO2 in normal range and within 360 days    CO2  Date Value Ref Range Status  12/12/2022 26 22 - 32 mmol/L Final   Bicarbonate  Date Value Ref Range Status  03/28/2015 29.2 (H) 21.0 - 28.0 mEq/L Final         Passed - ALT in normal range and within 360 days    ALT  Date Value Ref Range Status  11/08/2022 41 9 - 46 U/L Final         Passed - AST in normal range and within 360 days    AST  Date Value Ref Range Status  11/08/2022 30 10 - 35 U/L Final         Passed - Completed PHQ-2 or PHQ-9 in the last 360 days      Passed - Valid encounter within last 12 months    Recent Outpatient Visits           1 month ago Transient vision disturbance   [place] [facility] [place], [place] W, NP   3 months ago Acute intractable headache, unspecified headache type   [place] [facility] [place], [place], NP   3 months ago 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, unspecified laterality   [place] [facility] [place], Salvadore Oxford, NP   5 months ago Sore throat   [place] [facility] [place], Salvadore Oxford, NP   8 months ago Lightheadedness   [place] [place], Salvadore Oxford, NP       Future Appointments             In 2 months Baity, Salvadore Oxford, NP [place] [facility], [facility]   In 4 months Kirke Corin, Chelsea Aus, MD  HeartCare at  [facility]             furosemide (LASIX) 20 MG tablet [Pharmacy Med Name: FUROSEMIDE 20MG  TABLET] 90 tablet 0    Sig: TAKE ONE TABLET (20 MG TOTAL) BY MOUTH DAILY AS NEEDED.     Cardiovascular:  Diuretics - Loop Failed - 02/19/2023  1:57 PM      Failed - Cr in normal range and within 180 days    Creat  Date Value Ref Range Status  11/08/2022 1.32 0.70 - 1.35 mg/dL Final   Creatinine, Ser  Date Value Ref Range Status  12/12/2022 1.38 (H) 0.61 - 1.24 mg/dL Final         Failed - Mg Level in normal range and within 180 days    Magnesium  Date Value Ref Range Status  12/29/2016 1.7 1.7 - 2.4 mg/dL Final         Passed - K in normal range and within 180 days    Potassium  Date Value Ref Range Status  12/12/2022 3.8 3.5 - 5.1 mmol/L Final         Passed - Ca in normal range and within 180 days    Calcium  Date Value Ref Range Status  12/12/2022 9.1 8.9 - 10.3 mg/dL Final   Calcium, Ion  Date Value Ref Range Status  02/25/2017 1.22 1.15 - 1.40 mmol/L Final         Passed - Na in normal range and within 180 days    Sodium  Date Value Ref Range Status  12/12/2022 141 135 - 145 mmol/L Final  02/05/2021 145 (H) 134 - 144 mmol/L Final         Passed - Cl in normal range and within 180 days    Chloride  Date Value Ref Range Status  12/12/2022 106 98 - 111 mmol/L Final         Passed - Last BP in normal range    BP Readings from Last 1 Encounters:  01/16/23 128/72         Passed - Valid encounter within last 6 months    Recent Outpatient Visits           1 month ago Transient vision disturbance   [place] [facility] [place], [place] W, NP   3 months ago Acute intractable headache, unspecified headache type   [place] [facility] [place], [place] W, NP   3 months ago 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, unspecified laterality   [facility] [place], Salvadore Oxford, NP   5 months ago Sore throat    [place] [place], Salvadore Oxford, NP   8 months ago Lightheadedness   [place] [facility] [place], Salvadore Oxford, NP       Future Appointments             In 2 months Baity, Salvadore Oxford, NP  [facility], [facility]   In 4 months Kirke Corin, Chelsea Aus, MD [facility] at [facility]

## 2023-03-03 ENCOUNTER — Other Ambulatory Visit: Payer: Self-pay | Admitting: Internal Medicine

## 2023-03-04 MED ORDER — TRAMADOL HCL 50 MG PO TABS: 50.0000 mg | ORAL_TABLET | Freq: Every day | ORAL | 0 refills | Status: DC | PRN

## 2023-03-11 DIAGNOSIS — R42 Dizziness and giddiness: Secondary | ICD-10-CM | POA: Diagnosis not present

## 2023-03-11 DIAGNOSIS — G43809 Other migraine, not intractable, without status migrainosus: Secondary | ICD-10-CM | POA: Diagnosis not present

## 2023-03-11 DIAGNOSIS — H539 Unspecified visual disturbance: Secondary | ICD-10-CM | POA: Diagnosis not present

## 2023-03-11 DIAGNOSIS — R519 Headache, unspecified: Secondary | ICD-10-CM | POA: Diagnosis not present

## 2023-03-11 DIAGNOSIS — R2689 Other abnormalities of gait and mobility: Secondary | ICD-10-CM | POA: Diagnosis not present

## 2023-03-11 DIAGNOSIS — G43109 Migraine with aura, not intractable, without status migrainosus: Secondary | ICD-10-CM | POA: Diagnosis not present

## 2023-03-11 DIAGNOSIS — H5500 Unspecified nystagmus: Secondary | ICD-10-CM | POA: Diagnosis not present

## 2023-03-11 DIAGNOSIS — G4733 Obstructive sleep apnea (adult) (pediatric): Secondary | ICD-10-CM | POA: Diagnosis not present

## 2023-03-11 DIAGNOSIS — H532 Diplopia: Secondary | ICD-10-CM | POA: Diagnosis not present

## 2023-03-28 ENCOUNTER — Other Ambulatory Visit: Payer: Self-pay | Admitting: Internal Medicine

## 2023-03-31 NOTE — Telephone Encounter (Signed)
 Requested medication (s) are due for refill today: yes  Requested medication (s) are on the active medication list: yes  Last refill:  03/04/23 #30   Future visit scheduled: yes  Notes to clinic:  med not delegated to NT to Reorder   Requested Prescriptions  Pending Prescriptions Disp Refills   traMADol (ULTRAM) 50 MG tablet [Pharmacy Med Name: TRAMADOL HYDROCHLORIDE 50MG  TABLET] 30 tablet 0    Sig: TAKE ONE TABLET (50 MG TOTAL) BY MOUTH DAILY AS NEEDED.     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

## 2023-04-01 ENCOUNTER — Ambulatory Visit: Admitting: Internal Medicine

## 2023-04-14 ENCOUNTER — Other Ambulatory Visit: Payer: Self-pay | Admitting: Internal Medicine

## 2023-04-15 NOTE — Telephone Encounter (Signed)
 Requested Prescriptions  Pending Prescriptions Disp Refills   allopurinol (ZYLOPRIM) 100 MG tablet [Pharmacy Med Name: ALLOPURINOL 100MG  TABLET] 90 tablet 1    Sig: TAKE ONE TABLET (100 MG TOTAL) BY MOUTH DAILY.     Endocrinology:  Gout Agents - allopurinol Failed - 04/15/2023  5:45 PM      Failed - Cr in normal range and within 360 days    Creat  Date Value Ref Range Status  11/08/2022 1.32 0.70 - 1.35 mg/dL Final   Creatinine, Ser  Date Value Ref Range Status  12/12/2022 1.38 (H) 0.61 - 1.24 mg/dL Final         Passed - Uric Acid in normal range and within 360 days    Uric Acid, Serum  Date Value Ref Range Status  11/08/2022 7.2 4.0 - 8.0 mg/dL Final    Comment:    Therapeutic target for gout patients: <6.0 mg/dL .          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, unspecified laterality   [place] [facility] [place], Salvadore Oxford, NP   7 months ago Sore throat   [place] [facility] [place], Salvadore Oxford, NP   9 months ago Lightheadedness   [facility] [place], Salvadore Oxford, NP       Future Appointments             In 3 months Iran Ouch, MD [place] [facility] at [facility] - CBC within normal limits and completed in the last 12 months    WBC  Date Value Ref Range Status  12/12/2022 7.1 4.0 - 10.5 K/uL Final   RBC  Date Value Ref Range Status  12/12/2022 5.00 4.22 - 5.81 MIL/uL Final   Hemoglobin  Date Value Ref Range Status  12/12/2022 15.8 13.0 - 17.0 g/dL Final  91/47/8295 62.1 13.0 - 17.7 g/dL Final   HCT  Date Value Ref Range  Status  12/12/2022 45.2 39.0 - 52.0 % Final   Hematocrit  Date Value Ref Range Status  02/05/2021 45.5 37.5 - 51.0 % Final   MCHC  Date Value Ref Range Status  12/12/2022 35.0 30.0 - 36.0 g/dL Final   [facility]  Date Value Ref Range Status  12/12/2022 31.6 26.0 - 34.0 pg Final   MCV  Date Value Ref Range Status  12/12/2022 90.4 80.0 - 100.0 fL Final  02/05/2021 88 79 - 97 fL Final   No results found for: "PLTCOUNTKUC", "LABPLAT", "POCPLA" RDW  Date Value Ref Range Status  12/12/2022 12.9 11.5 - 15.5 % Final  02/05/2021 12.6 11.6 - 15.4 % Final

## 2023-04-28 ENCOUNTER — Other Ambulatory Visit: Payer: Self-pay | Admitting: Internal Medicine

## 2023-04-29 NOTE — Telephone Encounter (Signed)
 Requested medications are due for refill today.  yes  Requested medications are on the active medications list.  yes  Last refill. 03/31/2023 #30 0 rf  Future visit scheduled.   yes  Notes to clinic.  Refill not delegated.    Requested Prescriptions  Pending Prescriptions Disp Refills   traMADol (ULTRAM) 50 MG tablet [Pharmacy Med Name: TRAMADOL HYDROCHLORIDE 50MG  TABLET] 30 tablet 0    Sig: TAKE ONE TABLET (50 MG TOTAL) BY MOUTH DAILY AS NEEDED.     Not Delegated - Analgesics:  Opioid Agonists Failed - 04/29/2023 11:06 AM      Failed - This refill cannot be delegated      Failed - Urine Drug Screen completed in last 360 days      Failed - Valid encounter within last 3 months    Recent Outpatient Visits   None     Future Appointments             In 2 months Kirke Corin, Chelsea Aus, MD [facility] at [facility]

## 2023-04-30 ENCOUNTER — Other Ambulatory Visit: Payer: Self-pay

## 2023-04-30 ENCOUNTER — Emergency Department
Admission: EM | Admit: 2023-04-30 | Discharge: 2023-04-30 | Disposition: A | Discharge status: Home / Self Care | Attending: Emergency Medicine | Admitting: Emergency Medicine

## 2023-04-30 ENCOUNTER — Ambulatory Visit: Payer: Self-pay

## 2023-04-30 DIAGNOSIS — I10 Essential (primary) hypertension: Secondary | ICD-10-CM | POA: Diagnosis present

## 2023-04-30 NOTE — ED Provider Triage Note (Signed)
 Emergency Medicine Provider Triage Evaluation Note  Richard [name] , a 63 y.o. male  was evaluated in triage.  Pt complains of having high blood pressure at home.  Patient has similar appointment with  his PCP.  In triage blood pressure was 134/105.  Patient denies chest pain or headache.  Patient decided to go home, he is able to check his blood pressure at home.  Patient was given recommendations to come back to ED tonight otherwise he is to go back tomorrow with his PCP. Review of Systems  Positive:  Negative:  Physical Exam  BP (!) 134/105   Pulse 65   Temp 98 F (36.7 C) (Oral)   Resp 16   SpO2 95%  Gen:   Awake, no distress   Resp:  Normal effort  MSK:   Moves extremities without difficulty Other:    Medical Decision Making  Medically screening exam initiated at 6:18 PM.  Appropriate orders placed.  Maryann Conners was informed that the remainder of the evaluation will be completed by another provider, this initial triage assessment does not replace that evaluation, and the importance of remaining in the ED until their evaluation is complete.  Patient decided to go home and see tomorrow's PCP   Gladys Damme, PA-C 04/30/23 1819

## 2023-04-30 NOTE — ED Provider Notes (Signed)
 [facility] Provider Note    None    (approximate)   History   Hypertension    HPI  Richard [name] is a 63 y.o. male  , with no significant past medical history who presents to the ED complaining of high blood pressure lectures at home.     According to the patient he called PCP after getting blood pressure of 225/144 at home.  PCP recommended to come to ED. in triage blood pressure was 134/105.  Patient denies chest pain.       Physical Exam   Triage Vital Signs: ED Triage Vitals  Encounter Vitals Group     BP 04/30/23 1812 (!) 134/105     Systolic BP Percentile --      Diastolic BP Percentile --      Pulse Rate 04/30/23 1812 65     Resp 04/30/23 1812 16     Temp 04/30/23 1812 98 F (36.7 C)     Temp Source 04/30/23 1812 Oral     SpO2 04/30/23 1812 95 %     Weight --      Height --      Head Circumference --      Peak Flow --      Pain Score 04/30/23 1818 1     Pain Loc --      Pain Education --      Exclude from Growth Chart --     Most recent vital signs: Vitals:   04/30/23 1812  BP: (!) 134/105  Pulse: 65  Resp: 16  Temp: 98 F (36.7 C)  SpO2: 95%     Constitutional: Alert, NAD. Able to speak in complete sentences without cough or dyspnea  Eyes: Conjunctivae are normal.  Head: Atraumatic. Nose: No congestion/rhinnorhea. Mouth/Throat: Mucous membranes are moist.   Neck: Painless ROM. Supple. No JVD, nodes, thyromegaly  Cardiovascular:   Good peripheral circulation.RRR no murmurs, gallops, rubs  Respiratory: Normal respiratory effort.  No retractions. Clear to auscultation bilaterally without wheezing or crackles  Gastrointestinal: Soft and nontender.  Musculoskeletal:  no deformity Neurologic:  MAE spontaneously. No gross focal neurologic deficits are appreciated.  Skin:  Skin is warm, dry and intact. No rash noted. Psychiatric: Mood and affect are normal. Speech and behavior are normal.    ED Results / Procedures /  Treatments   Labs (all labs ordered are listed, but only abnormal results are displayed) Labs Reviewed - No data to display   EKG    RADIOLOGY    PROCEDURES:  Critical Care performed:   Procedures   MEDICATIONS ORDERED IN ED: Medications - No data to display    IMPRESSION / MDM / ASSESSMENT AND PLAN / ED COURSE  I reviewed the triage vital signs and the nursing notes.  Differential diagnosis includes, but is not limited to, high blood pressure, headache, dehydration  Patient's presentation is most consistent with acute, uncomplicated illness.   Patient's diagnosis is consistent with high blood pressure at home.  During triage blood pressure was 134/105 and patient decided to go home and come back if he has high blood pressure readings at home or symptoms. .The patient is in stable and satisfactory condition for discharge home  Patient will be discharged home without prescriptions. Patient is to follow up with PCP tomorrow as needed or otherwise directed. Patient is given ED precautions to return to the ED for any worsening or new symptoms. Discussed plan of care with patient, answered all of  patient's questions, Patient verbalized understanding.    FINAL CLINICAL IMPRESSION(S) / ED DIAGNOSES   Final diagnoses:  Hypertension, unspecified type     Rx / DC Orders   ED Discharge Orders     None        Note:  This document was prepared using Dragon voice recognition software and may include unintentional dictation errors.   Gladys Damme, PA-C 04/30/23 1825    Claybon Jabs, MD 05/01/23 1455

## 2023-04-30 NOTE — Discharge Instructions (Signed)
 Have been diagnosed with high blood pressure readings at home.  Please go tomorrow to your PCP appointment for follow-up hypertension these come back to ED  if you have new symptoms

## 2023-04-30 NOTE — Telephone Encounter (Signed)
 Copied from CRM [id number]. Topic: Appointments - Appointment Scheduling >> Apr 30, 2023  4:20 PM Priscille Kluver wrote: Patient/patient representative is calling to schedule an appointment. Refer to attachments for appointment information. Spoke to office they schedule patient for 1:40pm 05/01/23 but he will be there at 1pm spoke to [place] from the office. Patient blood pressure is 225/144  Chief Complaint: Elevated BP Symptoms: None at this time Frequency: Today Pertinent Negatives: Patient denies symptoms Disposition: [] ED /[x] Urgent Care (no appt availability in office) / [] Appointment(In office/virtual)/ []  [facility]/ [] Home Care/ [x] Refused Recommended Disposition /[] [facility]/ []  Follow-up with PCP Additional Notes: This RN returned a call to patient who is experiencing elevated BP. Patient stated visiting nurse measured his BP at 225/144 today. Patient stated the nurse retook the BP and it measured about the same. Patient stated he is not currently being treated for high BP at this time. Patient denied chest pain, headache, weakness and difficulty breathing. Patient stated he has vision problems at baseline and his provider is aware of this. Advised patient to see provider within 4 hours, per protocol. Advised UC, given time of day. Patient declined. Encouraged patient to reconsider, especially if symptoms develop. Patient declined.   Reason for Disposition  [1] Systolic BP  >= 200 OR Diastolic >= 120 AND [2] having NO cardiac or neurologic symptoms  Answer Assessment - Initial Assessment Questions 1. BLOOD PRESSURE: "What is the blood pressure?" "Did you take at least two measurements 5 minutes apart?"     225/144, re-measured and was "about the same" 2. ONSET: "When did you take your blood pressure?"     Today 3. HOW: "How did you take your blood pressure?" (e.g., automatic home BP monitor, visiting nurse)     Visiting nurse  4. HISTORY: "Do you have a history of high  blood pressure?"     Denies 5. MEDICINES: "Are you taking any medicines for blood pressure?" "Have you missed any doses recently?"     Denies 6. OTHER SYMPTOMS: "Do you have any symptoms?" (e.g., blurred vision, chest pain, difficulty breathing, headache, weakness)     Denies headache, blurred vision at baseline, denies difficulty breathing, denies chest pain, denies weakness  Protocols used: Blood Pressure - High-A-AH

## 2023-04-30 NOTE — ED Triage Notes (Signed)
 Pt BP was 225/144 earlier and he called PCP but was told to come to ER. Pt says he has a slight headache, otherwise no symptoms.

## 2023-05-01 ENCOUNTER — Ambulatory Visit (INDEPENDENT_AMBULATORY_CARE_PROVIDER_SITE_OTHER): Admitting: Internal Medicine

## 2023-05-01 ENCOUNTER — Other Ambulatory Visit: Payer: Self-pay | Admitting: Internal Medicine

## 2023-05-01 ENCOUNTER — Encounter: Payer: Self-pay | Admitting: Internal Medicine

## 2023-05-01 VITALS — BP 130/84 | Ht 69.0 in | Wt 252.0 lb

## 2023-05-01 DIAGNOSIS — Z6837 Body mass index (BMI) 37.0-37.9, adult: Secondary | ICD-10-CM

## 2023-05-01 DIAGNOSIS — E66812 Obesity, class 2: Secondary | ICD-10-CM | POA: Diagnosis not present

## 2023-05-01 DIAGNOSIS — I10 Essential (primary) hypertension: Secondary | ICD-10-CM | POA: Diagnosis not present

## 2023-05-01 MED ORDER — OLMESARTAN MEDOXOMIL 20 MG PO TABS: 20.0000 mg | ORAL_TABLET | Freq: Every day | ORAL | 0 refills | Status: DC

## 2023-05-01 NOTE — Patient Instructions (Signed)
 Hypertension, Adult High blood pressure (hypertension) is when the force of blood pumping through the arteries is too strong. The arteries are the blood vessels that carry blood from the heart throughout the body. Hypertension forces the heart to work harder to pump blood and may cause arteries to become narrow or stiff. Untreated or uncontrolled hypertension can lead to a heart attack, heart failure, a stroke, kidney disease, and other problems. A blood pressure reading consists of a higher number over a lower number. Ideally, your blood pressure should be below 120/80. The first ("top") number is called the systolic pressure. It is a measure of the pressure in your arteries as your heart beats. The second ("bottom") number is called the diastolic pressure. It is a measure of the pressure in your arteries as the heart relaxes. What are the causes? The exact cause of this condition is not known. There are some conditions that result in high blood pressure. What increases the risk? Certain factors may make you more likely to develop high blood pressure. Some of these risk factors are under your control, including: Smoking. Not getting enough exercise or physical activity. Being overweight. Having too much fat, sugar, calories, or salt (sodium) in your diet. Drinking too much alcohol. Other risk factors include: Having a personal history of heart disease, diabetes, high cholesterol, or kidney disease. Stress. Having a family history of high blood pressure and high cholesterol. Having obstructive sleep apnea. Age. The risk increases with age. What are the signs or symptoms? High blood pressure may not cause symptoms. Very high blood pressure (hypertensive crisis) may cause: Headache. Fast or irregular heartbeats (palpitations). Shortness of breath. Nosebleed. Nausea and vomiting. Vision changes. Severe chest pain, dizziness, and seizures. How is this diagnosed? This condition is diagnosed by  measuring your blood pressure while you are seated, with your arm resting on a flat surface, your legs uncrossed, and your feet flat on the floor. The cuff of the blood pressure monitor will be placed directly against the skin of your upper arm at the level of your heart. Blood pressure should be measured at least twice using the same arm. Certain conditions can cause a difference in blood pressure between your right and left arms. If you have a high blood pressure reading during one visit or you have normal blood pressure with other risk factors, you may be asked to: Return on a different day to have your blood pressure checked again. Monitor your blood pressure at home for 1 week or longer. If you are diagnosed with hypertension, you may have other blood or imaging tests to help your health care provider understand your overall risk for other conditions. How is this treated? This condition is treated by making healthy lifestyle changes, such as eating healthy foods, exercising more, and reducing your alcohol intake. You may be referred for counseling on a healthy diet and physical activity. Your health care provider may prescribe medicine if lifestyle changes are not enough to get your blood pressure under control and if: Your systolic blood pressure is above 130. Your diastolic blood pressure is above 80. Your personal target blood pressure may vary depending on your medical conditions, your age, and other factors. Follow these instructions at home: Eating and drinking  Eat a diet that is high in fiber and potassium, and low in sodium, added sugar, and fat. An example of this eating plan is called the DASH diet. DASH stands for Dietary Approaches to Stop Hypertension. To eat this way: Eat  plenty of fresh fruits and vegetables. Try to fill one half of your plate at each meal with fruits and vegetables. Eat whole grains, such as whole-wheat pasta, brown rice, or whole-grain bread. Fill about one  fourth of your plate with whole grains. Eat or drink low-fat dairy products, such as skim milk or low-fat yogurt. Avoid fatty cuts of meat, processed or cured meats, and poultry with skin. Fill about one fourth of your plate with lean proteins, such as fish, chicken without skin, beans, eggs, or tofu. Avoid pre-made and processed foods. These tend to be higher in sodium, added sugar, and fat. Reduce your daily sodium intake. Many people with hypertension should eat less than 1,500 mg of sodium a day. Do not drink alcohol if: Your health care provider tells you not to drink. You are pregnant, may be pregnant, or are planning to become pregnant. If you drink alcohol: Limit how much you have to: 0-1 drink a day for women. 0-2 drinks a day for men. Know how much alcohol is in your drink. In the [place] one drink equals one 12 oz bottle of beer (355 mL), one 5 oz glass of wine (148 mL), or one 1 oz glass of hard liquor (44 mL). Lifestyle  Work with your health care provider to maintain a healthy body weight or to lose weight. Ask what an ideal weight is for you. Get at least 30 minutes of exercise that causes your heart to beat faster (aerobic exercise) most days of the week. Activities may include walking, swimming, or biking. Include exercise to strengthen your muscles (resistance exercise), such as Pilates or lifting weights, as part of your weekly exercise routine. Try to do these types of exercises for 30 minutes at least 3 days a week. Do not use any products that contain nicotine or tobacco. These products include cigarettes, chewing tobacco, and vaping devices, such as e-cigarettes. If you need help quitting, ask your health care provider. Monitor your blood pressure at home as told by your health care provider. Keep all follow-up visits. This is important. Medicines Take over-the-counter and prescription medicines only as told by your health care provider. Follow directions carefully. Blood  pressure medicines must be taken as prescribed. Do not skip doses of blood pressure medicine. Doing this puts you at risk for problems and can make the medicine less effective. Ask your health care provider about side effects or reactions to medicines that you should watch for. Contact a health care provider if you: Think you are having a reaction to a medicine you are taking. Have headaches that keep coming back (recurring). Feel dizzy. Have swelling in your ankles. Have trouble with your vision. Get help right away if you: Develop a severe headache or confusion. Have unusual weakness or numbness. Feel faint. Have severe pain in your chest or abdomen. Vomit repeatedly. Have trouble breathing. These symptoms may be an emergency. Get help right away. Call 911. Do not wait to see if the symptoms will go away. Do not drive yourself to the hospital. Summary Hypertension is when the force of blood pumping through your arteries is too strong. If this condition is not controlled, it may put you at risk for serious complications. Your personal target blood pressure may vary depending on your medical conditions, your age, and other factors. For most people, a normal blood pressure is less than 120/80. Hypertension is treated with lifestyle changes, medicines, or a combination of both. Lifestyle changes include losing weight, eating a healthy,  low-sodium diet, exercising more, and limiting alcohol. This information is not intended to replace advice given to you by your health care provider. Make sure you discuss any questions you have with your health care provider. Document Revised: 11/14/2020 Document Reviewed: 11/14/2020 Elsevier Patient Education  2024 [employer].

## 2023-05-01 NOTE — Telephone Encounter (Signed)
Will discuss at upcoming appointment today 

## 2023-05-01 NOTE — Assessment & Plan Note (Signed)
 Encouraged diet and exercise for weight loss ?

## 2023-05-01 NOTE — Progress Notes (Signed)
 Subjective:    Patient ID: Richard [name], male    DOB: 08/17/1960, 63 y.o.   MRN: [id number]  HPI  Discussed the use of AI scribe software for clinical note transcription with the patient, who gave verbal consent to proceed.  Richard [name] is a 63 year old male who presents for follow-up of elevated blood pressure.  During a yearly insurance physical, his blood pressure was recorded at 244/130 mmHg, with a subsequent recheck also showing high readings. He was advised by a triage nurse to visit the emergency room due to concerns of a potential stroke, given his history of a 'mini stroke'. At the ER, his blood pressure was similar to the readings taken at the clinic today. No headaches, lightheadedness, or other symptoms associated with high blood pressure. He mentions mowing four yards the previous day without issues.  He recalls having an episode of vision changes the day before his blood pressure spiked, describing it as seeing 'ten different' images when looking at his phone. This episode resolved after a few minutes. He has a history of similar episodes, which a neurologist suggested could be migraines or mini seizures, although an EEG did not show any seizures. He questions how he could have a severe migraine without a headache.  He was previously on blood pressure medication before undergoing a gastric sleeve procedure, after which the medication was discontinued. He notes that his weight has been slowly increasing again. He has been monitoring his blood pressure at home, with readings varying from 212/80 mmHg to 134/78 mmHg. He uses a home blood pressure  wrist cuff, which he acknowledges may not be accurate, but notes that it has shown similar readings to those taken by a healthcare professional.       Review of Systems     Past Medical History:  Diagnosis Date   Arthritis    Arthrofibrosis of total knee arthroplasty (HCC) 05/30/2015   Bilateral pulmonary embolism (HCC)    a.  03/2015 CTA: acute bilat PE; b. IVC filter placed in 2017--> removed 2018.   Chest pain    a. 2015 Abnl MV;  b. 2015 Cath: nl cors.   Chronic diastolic CHF (congestive heart failure) (HCC)    a. 03/2015 Echo: EF 55-65%, poor windows, mildly dil RV.   DVT (deep venous thrombosis) (HCC)    a. 03/2015 U/S: occlusive DVT w/in the distal aspect of the Left fem vein through the L popliteal vein.   Dyspnea    On exertion   GERD (gastroesophageal reflux disease)    Headache    Hypertension    Hypertensive heart disease    Obesity    OSA (obstructive sleep apnea)    uses CPAP nightly settings at 15    PAF (paroxysmal atrial fibrillation) (HCC)    Pneumonia    hx of years ago    Primary localized osteoarthritis of left knee    a. 11/2014 s/p L TKA.   Primary localized osteoarthritis of right knee    a. 02/2014 s/p R Partial Knee arthroplasty.   Stiffness of left knee    a. 12/2014 s/p manipulation under anesthesia.   Urine incontinence     Current Outpatient Medications  Medication Sig Dispense Refill   allopurinol (ZYLOPRIM) 100 MG tablet TAKE ONE TABLET (100 MG TOTAL) BY MOUTH DAILY. 90 tablet 1   baclofen (LIORESAL) 10 MG tablet Take 1 tablet (10 mg total) by mouth 2 (two) times daily. 180 each 1   colchicine  0.6 MG tablet TAKE ONE TABLET BY MOUTH TWICE A DAY 180 tablet 2   ELIQUIS 5 MG TABS tablet TAKE ONE TABLET BY MOUTH TWO TIMES DAILY (Patient not taking: Reported on 01/16/2023) 180 tablet 1   flecainide (TAMBOCOR) 100 MG tablet TAKE (1) ONE TABLET BY MOUTH (2) TWICE DAILY 180 tablet 1   furosemide (LASIX) 20 MG tablet TAKE ONE TABLET (20 MG TOTAL) BY MOUTH DAILY AS NEEDED. 90 tablet 0   Multiple Vitamins-Minerals (MULTIVITAMIN WITH MINERALS) tablet Take 1 tablet by mouth daily.     nortriptyline (PAMELOR) 10 MG capsule Take 10 mg  nightly for 1 week, then increase to 20 mg nightly. PLEASE MAKE SURE TO KEEP CARDIO APPT for EKG.     pantoprazole (PROTONIX) 40 MG tablet TAKE ONE TABLET  BY MOUTH ONCE A DAY 90 tablet 2   Sennosides-Docusate Sodium (SM STOOL SOFTENER/LAXATIVE PO) Take 8.6 mg by mouth daily.     topiramate (TOPAMAX) 25 MG tablet TAKE ONE TABLET (25 MG TOTAL) BY MOUTH AT BEDTIME. 90 tablet 0   traMADol (ULTRAM) 50 MG tablet Take 1 tablet (50 mg total) by mouth daily as needed. 30 tablet 0   No current facility-administered medications for this visit.    Allergies  Allergen Reactions   Penicillins Anaphylaxis and Other (See Comments)    From childhood; uncertain of reaction Has patient had a PCN reaction causing immediate rash, facial/tongue/throat swelling, SOB or lightheadedness with hypotension: Unk Has patient had a PCN reaction causing severe rash involving mucus membranes or skin necrosis: Unk Has patient had a PCN reaction that required hospitalization: Unk Has patient had a PCN reaction occurring within the last 10 years: No If all of the above answers are "NO", then may proceed with Cephalosporin use.   Vancomycin Other (See Comments)    Red Mans' syndrome   Penicillins Other (See Comments)    Childhood   Vancomycin Other (See Comments)    Red man Syndrome    Family History  Problem Relation Age of Onset   Coronary artery disease Mother    Hyperlipidemia Mother    Hypertension Mother    Arthritis Mother    Coronary artery disease Father    Heart disease Paternal Grandmother    Alzheimer's disease Paternal Grandfather     Social History   Socioeconomic History   Marital status: Married    Spouse name: Richard [name]   Number of children: 2   Years of education: 12   Highest education level: Not on file  Occupational History   Occupation: Acupuncturist: Engineer, civil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) [employer]  Tobacco Use   Smoking status: Never   Smokeless tobacco: Never  Vaping Use   Vaping status: Never Used  Substance and Sexual Activity   Alcohol use: Not Currently    Comment: \   Drug use: No   Sexual activity: Yes  Other Topics Concern   Not on file  Social  History Narrative   ** Merged History Encounter **       Social Drivers of Health   Financial Resource Strain: Medium Risk (01/07/2023)   [facility]   Overall Financial Resource Strain (CARDIA)    Difficulty of Paying Living Expenses: Somewhat hard  Food Insecurity: Food Insecurity Present (01/07/2023)   Received from [facility]   Hunger Vital Sign    Worried About Running Out of Food in the Last Year: Sometimes true    Ran Out of Food  in the Last Year: Sometimes true  Transportation Needs: No Transportation Needs (01/07/2023)   Received from [facility] - Transportation    In the past 12 months, has lack of transportation kept you from medical appointments or from getting medications?: No    Lack of Transportation (Non-Medical): No  Physical Activity: Sufficiently Active (08/08/2022)   Exercise Vital Sign    Days of Exercise per Week: 7 days    Minutes of Exercise per Session: 40 min  Stress: No Stress Concern Present (08/08/2022)   [employer] of Occupational Health - Occupational Stress Questionnaire    Feeling of Stress : Not at all  Social Connections: Moderately Integrated (08/08/2022)   Social Connection and Isolation Panel [NHANES]    Frequency of Communication with Friends and Family: More than three times a week    Frequency of Social Gatherings with Friends and Family: More than three times a week    Attends Religious Services: 1 to 4 times per year    Active Member of [employer] or Organizations: No    Attends Banker Meetings: Never    Marital Status: Married  Catering manager Violence: Not At Risk (12/10/2022)   Humiliation, Afraid, Rape, and Kick questionnaire    Fear of Current or Ex-Partner: No    Emotionally Abused: No    Physically Abused: No    Sexually Abused: No     Constitutional:  Denies fever, malaise, fatigue, headache, or abrupt weight changes.  HEENT:  Denies eye pain, eye redness, ear pain, ringing in the ears, wax buildup, runny nose, nasal congestion, bloody nose, or sore throat. Respiratory: Denies difficulty breathing, shortness of breath, cough or sputum production.   Cardiovascular: Denies chest pain, chest tightness, palpitations or swelling in the hands or feet.  Musculoskeletal: Patient reports joint pain.  Denies decrease in range of motion, difficulty with gait, muscle pain or joint swelling.  Skin: Denies redness, rashes, lesions or ulcercations.  Neurological: Patient reports intermittent dizziness.  Denies difficulty with memory, difficulty with speech or problems with balance and coordination.  Psych: Denies anxiety, depression, SI/HI.  No other specific complaints in a complete review of systems (except as listed in HPI above).  Objective:   Physical Exam BP (!) 138/92 (BP Location: Left Arm, Patient Position: Sitting, Cuff Size: Large)   Ht 5\' 9"  (1.753 m)   Wt 252 lb (114.3 kg)   BMI 37.21 kg/m     Wt Readings from Last 3 Encounters:  01/16/23 252 lb 2 oz (114.4 kg)  12/23/22 247 lb 6.4 oz (112.2 kg)  12/10/22 244 lb 14.9 oz (111.1 kg)    General: Appears his stated age, obese, in NAD. HEENT: Head: normal shape and size; Eyes: sclera white, no icterus, conjunctiva pink, PERRLA and EOMs intact;  Cardiovascular: Normal rate and rhythm. S1,S2 noted.  No murmur, rubs or gallops noted. No JVD or BLE edema. No carotid bruits noted. Pulmonary/Chest: Normal effort and positive vesicular breath sounds. No respiratory distress. No wheezes, rales or ronchi noted.  Musculoskeletal:  No difficulty with gait.  Neurological: Alert and oriented. Cranial nerves II-XII grossly intact. Coordination normal.    BMET    Component Value Date/Time   NA 141 12/12/2022 0437   NA 145 (H) 02/05/2021 0909   K 3.8 12/12/2022 0437   CL 106 12/12/2022 0437   CO2 26 12/12/2022 0437   GLUCOSE 88 12/12/2022 0437   BUN 24 (H) 12/12/2022  0437   BUN 16  02/05/2021 0909   CREATININE 1.38 (H) 12/12/2022 0437   CREATININE 1.32 11/08/2022 0948   CALCIUM 9.1 12/12/2022 0437   GFRNONAA 58 (L) 12/12/2022 0437   GFRAA >60 10/19/2019 1337   GFRAA >60 10/19/2019 1337    Lipid Panel     Component Value Date/Time   CHOL 157 11/08/2022 0948   CHOL 143 02/05/2021 0909   TRIG 71 11/08/2022 0948   HDL 47 11/08/2022 0948   HDL 48 02/05/2021 0909   CHOLHDL 3.3 11/08/2022 0948   VLDL 22.4 01/17/2020 1214   LDLCALC 94 11/08/2022 0948    CBC    Component Value Date/Time   WBC 7.1 12/12/2022 0437   RBC 5.00 12/12/2022 0437   HGB 15.8 12/12/2022 0437   HGB 16.2 02/05/2021 0909   HCT 45.2 12/12/2022 0437   HCT 45.5 02/05/2021 0909   PLT 159 12/12/2022 0437   PLT 195 02/05/2021 0909   MCV 90.4 12/12/2022 0437   MCV 88 02/05/2021 0909   MCH 31.6 12/12/2022 0437   MCHC 35.0 12/12/2022 0437   RDW 12.9 12/12/2022 0437   RDW 12.6 02/05/2021 0909   LYMPHSABS 2.1 12/12/2022 0437   LYMPHSABS 1.6 10/31/2017 1438   MONOABS 0.7 12/12/2022 0437   EOSABS 0.3 12/12/2022 0437   EOSABS 0.2 10/31/2017 1438   BASOSABS 0.1 12/12/2022 0437   BASOSABS 0.0 10/31/2017 1438    Hgb A1C Lab Results  Component Value Date   HGBA1C 5.1 11/08/2022           Assessment & Plan:  Assessment and Plan    ER Followup for Hypertension ER notes reviewed Significantly elevated blood pressure readings, initially 244/130 mmHg, inconsistent with home measurements. Previous management with medication before gastric sleeve. Gradual weight increase may contribute to recurrence. Initiated low-dose antihypertensive therapy. Explained that true high readings would likely cause symptoms. - Prescribe olmesartan 20 mg once daily. - Advise home blood pressure monitoring and report significant changes or symptoms. - Schedule follow-up in two weeks for physical and blood pressure reassessment.     RTC in 2 weeks for your annual exam Nicki Reaper, NP

## 2023-05-02 NOTE — Telephone Encounter (Signed)
 Requested medication (s) are due for refill today: yes  Requested medication (s) are on the active medication list: yes  Last refill:  11/08/22 #180 1RF  Future visit scheduled: yes  Notes to clinic:  Creatinine level out of range did not see if addresses in OV note    Requested Prescriptions  Pending Prescriptions Disp Refills   baclofen (LIORESAL) 10 MG tablet [Pharmacy Med Name: BACLOFEN 10MG  TABLET] 180 tablet 1    Sig: TAKE ONE TABLET (10 MG TOTAL) BY MOUTH TWO TIMES DAILY.     Analgesics:  Muscle Relaxants - baclofen Failed - 05/02/2023 12:09 PM      Failed - Cr in normal range and within 180 days    Creat  Date Value Ref Range Status  11/08/2022 1.32 0.70 - 1.35 mg/dL Final   Creatinine, Ser  Date Value Ref Range Status  12/12/2022 1.38 (H) 0.61 - 1.24 mg/dL Final         Passed - eGFR is 30 or above and within 180 days    GFR calc Af Amer  Date Value Ref Range Status  10/19/2019 >60 >60 mL/min Final  10/19/2019 >60 >60 mL/min Final   GFR, Estimated  Date Value Ref Range Status  12/12/2022 58 (L) >60 mL/min Final    Comment:    (NOTE) Calculated using the CKD-EPI Creatinine Equation (2021)    GFR  Date Value Ref Range Status  01/17/2020 60.38 >60.00 mL/min Final    Comment:    Calculated using the CKD-EPI Creatinine Equation (2021)   eGFR  Date Value Ref Range Status  11/08/2022 61 > OR = 60 mL/min/1.82m2 Final  02/05/2021 73 >59 mL/min/1.73 Final         Passed - Valid encounter within last 6 months    Recent Outpatient Visits           Yesterday Primary hypertension   [place] [facility] [place], Salvadore [place], NP       Future Appointments             In 2 months Kirke Corin, Chelsea Aus, MD [facility] at [facility]

## 2023-05-14 ENCOUNTER — Encounter: Payer: Medicare Other | Admitting: Internal Medicine

## 2023-05-16 ENCOUNTER — Encounter (HOSPITAL_COMMUNITY): Payer: Self-pay | Admitting: *Deleted

## 2023-05-16 ENCOUNTER — Other Ambulatory Visit: Payer: Self-pay | Admitting: Internal Medicine

## 2023-05-16 NOTE — Telephone Encounter (Signed)
 Requested Prescriptions  Pending Prescriptions Disp Refills   pantoprazole  (PROTONIX ) 40 MG tablet [Pharmacy Med Name: PANTOPRAZOLE  SODIUM 40MG  TABLET DR] 90 tablet 0    Sig: TAKE ONE TABLET BY MOUTH ONCE A DAY     Gastroenterology: Proton Pump Inhibitors Passed - 05/16/2023  4:06 PM      Passed - Valid encounter within last 12 months    Recent Outpatient Visits           2 weeks ago Primary hypertension   [place] [facility] [place], Rankin Buzzard, NP       Future Appointments             In 2 months Alvenia Aus, Tia Flowers, MD [facility] at [facility]

## 2023-05-19 ENCOUNTER — Other Ambulatory Visit: Payer: Self-pay | Admitting: Internal Medicine

## 2023-05-21 NOTE — Telephone Encounter (Signed)
 Requested Prescriptions  Pending Prescriptions Disp Refills   furosemide  (LASIX ) 20 MG tablet [Pharmacy Med Name: FUROSEMIDE  20MG  TABLET] 90 tablet 0    Sig: TAKE ONE TABLET (20 MG TOTAL) BY MOUTH DAILY AS NEEDED.     Cardiovascular:  Diuretics - Loop Failed - 05/21/2023 11:59 AM      Failed - Cr in normal range and within 180 days    Creat  Date Value Ref Range Status  11/08/2022 1.32 0.70 - 1.35 mg/dL Final   Creatinine, Ser  Date Value Ref Range Status  12/12/2022 1.38 (H) 0.61 - 1.24 mg/dL Final         Failed - Mg Level in normal range and within 180 days    Magnesium   Date Value Ref Range Status  12/29/2016 1.7 1.7 - 2.4 mg/dL Final         Passed - K in normal range and within 180 days    Potassium  Date Value Ref Range Status  12/12/2022 3.8 3.5 - 5.1 mmol/L Final         Passed - Ca in normal range and within 180 days    Calcium  Date Value Ref Range Status  12/12/2022 9.1 8.9 - 10.3 mg/dL Final   Calcium, Ion  Date Value Ref Range Status  02/25/2017 1.22 1.15 - 1.40 mmol/L Final         Passed - Na in normal range and within 180 days    Sodium  Date Value Ref Range Status  12/12/2022 141 135 - 145 mmol/L Final  02/05/2021 145 (H) 134 - 144 mmol/L Final         Passed - Cl in normal range and within 180 days    Chloride  Date Value Ref Range Status  12/12/2022 106 98 - 111 mmol/L Final         Passed - Last BP in normal range    BP Readings from Last 1 Encounters:  05/01/23 130/84         Passed - Valid encounter within last 6 months    Recent Outpatient Visits           2 weeks ago Primary hypertension   [place] [facility] [place], Rankin Buzzard, NP       Future Appointments             In 1 month Alvenia Aus, Tia Flowers, MD [facility]             topiramate  (TOPAMAX ) 25 MG tablet [Pharmacy Med Name: TOPIRAMATE  25MG  TABLET] 90 tablet 0    Sig: TAKE ONE TABLET (25 MG TOTAL) BY MOUTH AT BEDTIME.      Neurology: Anticonvulsants - topiramate  & zonisamide Failed - 05/21/2023 11:59 AM      Failed - Cr in normal range and within 360 days    Creat  Date Value Ref Range Status  11/08/2022 1.32 0.70 - 1.35 mg/dL Final   Creatinine, Ser  Date Value Ref Range Status  12/12/2022 1.38 (H) 0.61 - 1.24 mg/dL Final         Passed - CO2 in normal range and within 360 days    CO2  Date Value Ref Range Status  12/12/2022 26 22 - 32 mmol/L Final   Bicarbonate  Date Value Ref Range Status  03/28/2015 29.2 (H) 21.0 - 28.0 mEq/L Final         Passed - ALT in normal range and within 360 days  ALT  Date Value Ref Range Status  11/08/2022 41 9 - 46 U/L Final         Passed - AST in normal range and within 360 days    AST  Date Value Ref Range Status  11/08/2022 30 10 - 35 U/L Final         Passed - Completed PHQ-2 or PHQ-9 in the last 360 days      Passed - Valid encounter within last 12 months    Recent Outpatient Visits           2 weeks ago Primary hypertension   [place] [facility] [place], Rankin Buzzard, NP       Future Appointments             In 1 month Alvenia Aus, Tia Flowers, MD [facility] at [facility]

## 2023-05-26 ENCOUNTER — Other Ambulatory Visit: Payer: Self-pay | Admitting: Internal Medicine

## 2023-05-27 ENCOUNTER — Other Ambulatory Visit: Payer: Self-pay | Admitting: Internal Medicine

## 2023-05-28 NOTE — Telephone Encounter (Signed)
 Requested medication (s) are due for refill today: Yes  Requested medication (s) are on the active medication list: Yes  Last refill:  04/29/23  Future visit scheduled: No  Notes to clinic:  Not delegated.    Requested Prescriptions  Pending Prescriptions Disp Refills   traMADol  (ULTRAM ) 50 MG tablet [Pharmacy Med Name: TRAMADOL  HYDROCHLORIDE 50MG  TABLET] 30 tablet 0    Sig: TAKE ONE TABLET (50 MG TOTAL) BY MOUTH DAILY AS NEEDED.     Not Delegated - Analgesics:  Opioid Agonists Failed - 05/28/2023 12:40 PM      Failed - This refill cannot be delegated      Failed - Urine Drug Screen completed in last 360 days      Passed - Valid encounter within last 3 months    Recent Outpatient Visits           3 weeks ago Primary hypertension   [place] [facility] [place], Rankin Buzzard, NP       Future Appointments             In 1 month Alvenia Aus, Tia Flowers, MD [facility] at [facility]

## 2023-05-29 NOTE — Telephone Encounter (Signed)
 Requested medication (s) are due for refill today: yes   Requested medication (s) are on the active medication list: yes   Last refill:  05/01/23 #30 0 refills   Future visit scheduled: no   Notes to clinic:  protocol failed. Last labs 12/12/22 do you want to refill Rx?     Requested Prescriptions  Pending Prescriptions Disp Refills   olmesartan  (BENICAR ) 20 MG tablet [Pharmacy Med Name: OLMESARTAN  MEDOXOMIL 20MG  TABLET] 30 tablet 0    Sig: TAKE ONE TABLET (20 MG TOTAL) BY MOUTH DAILY.     Cardiovascular:  Angiotensin Receptor Blockers Failed - 05/29/2023  4:10 PM      Failed - Cr in normal range and within 180 days    Creat  Date Value Ref Range Status  11/08/2022 1.32 0.70 - 1.35 mg/dL Final   Creatinine, Ser  Date Value Ref Range Status  12/12/2022 1.38 (H) 0.61 - 1.24 mg/dL Final         Passed - K in normal range and within 180 days    Potassium  Date Value Ref Range Status  12/12/2022 3.8 3.5 - 5.1 mmol/L Final         Passed - Patient is not pregnant      Passed - Last BP in normal range    BP Readings from Last 1 Encounters:  05/01/23 130/84         Passed - Valid encounter within last 6 months    Recent Outpatient Visits           4 weeks ago Primary hypertension   [place] [facility] [place], Rankin Buzzard, NP       Future Appointments             In 1 month Alvenia Aus, Tia Flowers, MD [facility] at [facility]

## 2023-06-02 ENCOUNTER — Other Ambulatory Visit: Payer: Self-pay | Admitting: Internal Medicine

## 2023-06-04 NOTE — Telephone Encounter (Signed)
 Rx 05/29/23 #30- needs appointment for BP recheck Requested Prescriptions  Pending Prescriptions Disp Refills   olmesartan  (BENICAR ) 20 MG tablet [Pharmacy Med Name: OLMESARTAN  MEDOXOMIL 20MG  TABLET] 30 tablet 0    Sig: TAKE ONE TABLET (20 MG TOTAL) BY MOUTH DAILY.     Cardiovascular:  Angiotensin Receptor Blockers Failed - 06/04/2023 12:00 PM      Failed - Cr in normal range and within 180 days    Creat  Date Value Ref Range Status  11/08/2022 1.32 0.70 - 1.35 mg/dL Final   Creatinine, Ser  Date Value Ref Range Status  12/12/2022 1.38 (H) 0.61 - 1.24 mg/dL Final         Passed - K in normal range and within 180 days    Potassium  Date Value Ref Range Status  12/12/2022 3.8 3.5 - 5.1 mmol/L Final         Passed - Patient is not pregnant      Passed - Last BP in normal range    BP Readings from Last 1 Encounters:  05/01/23 130/84         Passed - Valid encounter within last 6 months    Recent Outpatient Visits           1 month ago Primary hypertension   [place] [facility] [place], Rankin Buzzard, NP       Future Appointments             In 1 month Alvenia Aus, Tia Flowers, MD [facility] at [facility]

## 2023-06-10 ENCOUNTER — Other Ambulatory Visit: Payer: Self-pay | Admitting: Cardiovascular Disease

## 2023-06-10 DIAGNOSIS — I480 Paroxysmal atrial fibrillation: Secondary | ICD-10-CM

## 2023-06-10 NOTE — Telephone Encounter (Signed)
 Pt last saw Dr Alvenia Aus 01/16/23, last labs 12/12/22 Creat 1.38, age 63, weight 114.3kg, based on specified criteria pt is on appropriate dosage of Eliquis  5mg  BID for afib.  Will refill rx.

## 2023-06-10 NOTE — Telephone Encounter (Signed)
 Refill request

## 2023-06-13 ENCOUNTER — Other Ambulatory Visit: Payer: Self-pay | Admitting: Internal Medicine

## 2023-06-17 NOTE — Telephone Encounter (Signed)
 Requested Prescriptions  Pending Prescriptions Disp Refills   colchicine  0.6 MG tablet [Pharmacy Med Name: COLCHICINE  0.6MG  TABLET] 180 tablet 1    Sig: TAKE ONE TABLET BY MOUTH TWICE A DAY     Endocrinology:  Gout Agents - colchicine  Failed - 06/17/2023 11:51 AM      Failed - Cr in normal range and within 360 days    Creat  Date Value Ref Range Status  11/08/2022 1.32 0.70 - 1.35 mg/dL Final   Creatinine, Ser  Date Value Ref Range Status  12/12/2022 1.38 (H) 0.61 - 1.24 mg/dL Final         Passed - ALT in normal range and within 360 days    ALT  Date Value Ref Range Status  11/08/2022 41 9 - 46 U/L Final         Passed - AST in normal range and within 360 days    AST  Date Value Ref Range Status  11/08/2022 30 10 - 35 U/L Final         Passed - Valid encounter within last 12 months    Recent Outpatient Visits           1 month ago Primary hypertension   [place] [facility] [place], Rankin Buzzard, NP       Future Appointments             In 4 weeks Wenona [place], MD [place] [facility] - CBC within normal limits and completed in the last 12 months    WBC  Date Value Ref Range Status  12/12/2022 7.1 4.0 - 10.5 K/uL Final   RBC  Date Value Ref Range Status  12/12/2022 5.00 4.22 - 5.81 MIL/uL Final   Hemoglobin  Date Value Ref Range Status  12/12/2022 15.8 13.0 - 17.0 g/dL Final  16/10/9602 54.0 13.0 - 17.7 g/dL Final   HCT  Date Value Ref Range Status  12/12/2022 45.2 39.0 - 52.0 % Final   Hematocrit  Date Value Ref Range Status  02/05/2021 45.5 37.5 - 51.0 % Final   MCHC  Date Value Ref Range Status  12/12/2022 35.0 30.0 - 36.0 g/dL Final   [facility]  Date Value Ref Range Status  12/12/2022 31.6 26.0 - 34.0 pg Final   MCV  Date Value Ref Range Status  12/12/2022 90.4 80.0 - 100.0 fL Final  02/05/2021 88 79 - 97 fL Final   No results found for: "PLTCOUNTKUC", "LABPLAT", "POCPLA" RDW  Date  Value Ref Range Status  12/12/2022 12.9 11.5 - 15.5 % Final  02/05/2021 12.6 11.6 - 15.4 % Final

## 2023-06-26 ENCOUNTER — Other Ambulatory Visit: Payer: Self-pay | Admitting: Internal Medicine

## 2023-06-27 NOTE — Telephone Encounter (Signed)
 Requested medication (s) are due for refill today - yes  Requested medication (s) are on the active medication list -yes  Future visit scheduled -yes  Last refill: 05/29/23 #30  Notes to clinic: non delegated Rx  Requested Prescriptions  Pending Prescriptions Disp Refills   traMADol  (ULTRAM ) 50 MG tablet [Pharmacy Med Name: TRAMADOL  HYDROCHLORIDE 50MG  TABLET] 30 tablet 0    Sig: TAKE ONE TABLET (50 MG TOTAL) BY MOUTH DAILY AS NEEDED.     Not Delegated - Analgesics:  Opioid Agonists Failed - 06/27/2023 10:51 AM      Failed - This refill cannot be delegated      Failed - Urine Drug Screen completed in last 360 days      Passed - Valid encounter within last 3 months    Recent Outpatient Visits           1 month ago Primary hypertension   [place] [facility] [place], Rankin Buzzard, NP       Future Appointments             In 2 weeks Wenona Hamilton, MD [facility]               Requested Prescriptions  Pending Prescriptions Disp Refills   traMADol  (ULTRAM ) 50 MG tablet [Pharmacy Med Name: TRAMADOL  HYDROCHLORIDE 50MG  TABLET] 30 tablet 0    Sig: TAKE ONE TABLET (50 MG TOTAL) BY MOUTH DAILY AS NEEDED.     Not Delegated - Analgesics:  Opioid Agonists Failed - 06/27/2023 10:51 AM      Failed - This refill cannot be delegated      Failed - Urine Drug Screen completed in last 360 days      Passed - Valid encounter within last 3 months    Recent Outpatient Visits           1 month ago Primary hypertension   [place] [place], Rankin Buzzard, NP       Future Appointments             In 2 weeks Alvenia Aus, Tia Flowers, MD [facility] at [facility]

## 2023-06-30 ENCOUNTER — Ambulatory Visit (INDEPENDENT_AMBULATORY_CARE_PROVIDER_SITE_OTHER): Admitting: Internal Medicine

## 2023-06-30 ENCOUNTER — Encounter: Payer: Self-pay | Admitting: Internal Medicine

## 2023-06-30 ENCOUNTER — Ambulatory Visit: Payer: Self-pay | Admitting: *Deleted

## 2023-06-30 VITALS — BP 118/74 | Ht 69.0 in | Wt 248.0 lb

## 2023-06-30 DIAGNOSIS — L03113 Cellulitis of right upper limb: Secondary | ICD-10-CM | POA: Diagnosis not present

## 2023-06-30 MED ORDER — SULFAMETHOXAZOLE-TRIMETHOPRIM 800-160 MG PO TABS: 1.0000 | ORAL_TABLET | Freq: Two times a day (BID) | ORAL | 0 refills | Status: AC

## 2023-06-30 NOTE — Telephone Encounter (Signed)
 Will discuss at upcoming appt.

## 2023-06-30 NOTE — Telephone Encounter (Signed)
 FYI Only or Action Required?: Action required by provider  Patient was last seen in primary care on 05/01/2023 by Carollynn Cirri, NP. Called Nurse Triage reporting No chief complaint on file.. Symptoms began several weeks ago. Interventions attempted: OTC medications:  Aaron Aas Symptoms are: unchanged.  Triage Disposition: See PCP When Office is Open (Within 3 Days)  Patient/caregiver understands and will follow disposition?: yes                    Copied from CRM [id number]. Topic: Clinical - Red Word Triage >> Jun 30, 2023  9:34 AM Alethia Huxley E wrote: [facility] that prompted transfer to Nurse Triage: Possible bug bite. Patient has spot on right arm that is red and itchy, been there for about 3 weeks. Patient's wife thinks it might be a bug bite, but patient does not recall seeing a bug on his arm. Benadryl  not helping. Answer Assessment - Initial Assessment Questions 1. TYPE of INSECT: "What type of insect was it?"      Unsure  2. ONSET: "When did you get bitten?"      3 1/2 weeks ago  3. LOCATION: "Where is the insect bite located?"      Right arm  4. REDNESS: "Is the area red or pink?" If Yes, ask: "What size is area of redness?" (inches or cm). "When did the redness start?"     Now looks bruised size of 50 cent  5. PAIN: "Is there any pain?" If Yes, ask: "How bad is it?"  (Scale 1-10; or mild, moderate, severe)     No  6. ITCHING: "Does it itch?" If Yes, ask: "How bad is the itch?"    - MILD: doesn't interfere with normal activities   - MODERATE-SEVERE: interferes with work, school, sleep, or other activities      Yes  7. SWELLING: "How big is the swelling?" (inches, cm, or compare to coins)     Denies  8. OTHER SYMPTOMS: "Do you have any other symptoms?"  (e.g., difficulty breathing, hives)     Site was red now looks bruised with yellow center no drainage 9. PREGNANCY: "Is there any chance you are pregnant?" "When was your last menstrual period?"     Na  Scheduled appt for  tomorrow.  Protocols used: Insect Bite-A-AH  Reason for Disposition  [1] SEVERE local itching (i.e., interferes with work, school, sleep) AND [2] not improved after 24 hours of hydrocortisone cream  Answer Assessment - Initial Assessment Questions 1. TYPE of INSECT: "What type of insect was it?"      Unsure  2. ONSET: "When did you get bitten?"      3 1/2 weeks ago  3. LOCATION: "Where is the insect bite located?"      Right arm  4. REDNESS: "Is the area red or pink?" If Yes, ask: "What size is area of redness?" (inches or cm). "When did the redness start?"     Now looks bruised size of 50 cent  5. PAIN: "Is there any pain?" If Yes, ask: "How bad is it?"  (Scale 1-10; or mild, moderate, severe)     No  6. ITCHING: "Does it itch?" If Yes, ask: "How bad is the itch?"    - MILD: doesn't interfere with normal activities   - MODERATE-SEVERE: interferes with work, school, sleep, or other activities      Yes  7. SWELLING: "How big is the swelling?" (inches, cm, or compare to coins)  Denies  8. OTHER SYMPTOMS: "Do you have any other symptoms?"  (e.g., difficulty breathing, hives)     Site was red now looks bruised with yellow center no drainage 9. PREGNANCY: "Is there any chance you are pregnant?" "When was your last menstrual period?"     Na  Scheduled appt for tomorrow.  Protocols used: Insect Bite-A-AH

## 2023-06-30 NOTE — Progress Notes (Signed)
 Subjective:    Patient ID: Richard [name], male    DOB: 1960-12-17, 63 y.o.   MRN: [id number]  HPI  Discussed the use of AI scribe software for clinical note transcription with the patient, who gave verbal consent to proceed.  Richard [name] is a 63 year old male who presents with a raised, itchy spot on his arm.  He has had a raised spot on his arm for about three to three and a half weeks. The spot is itchy and recently puffed up but has since decreased in size. He is unsure if it is a bite, though his wife suspects it might be. No burning sensation is associated with the spot.  He has been taking benadryl  for the itching, usually one tablet. He has not used hydrocortisone cream but is open to using it if necessary. He noticed one of the red dots on the spot had an open hole, which he treated with peroxide.    Review of Systems   Past Medical History:  Diagnosis Date   Arthritis    Arthrofibrosis of total knee arthroplasty (HCC) 05/30/2015   Bilateral pulmonary embolism (HCC)    a. 03/2015 CTA: acute bilat PE; b. IVC filter placed in 2017--> removed 2018.   Chest pain    a. 2015 Abnl MV;  b. 2015 Cath: nl cors.   Chronic diastolic CHF (congestive heart failure) (HCC)    a. 03/2015 Echo: EF 55-65%, poor windows, mildly dil RV.   DVT (deep venous thrombosis) (HCC)    a. 03/2015 U/S: occlusive DVT w/in the distal aspect of the Left fem vein through the L popliteal vein.   Dyspnea    On exertion   GERD (gastroesophageal reflux disease)    Headache    Hypertension    Hypertensive heart disease    Obesity    OSA (obstructive sleep apnea)    uses CPAP nightly settings at 15    PAF (paroxysmal atrial fibrillation) (HCC)    Pneumonia    hx of years ago    Primary localized osteoarthritis of left knee    a. 11/2014 s/p L TKA.   Primary localized osteoarthritis of right knee    a. 02/2014 s/p R Partial Knee arthroplasty.   Stiffness of left knee    a. 12/2014 s/p manipulation  under anesthesia.   Urine incontinence     Current Outpatient Medications  Medication Sig Dispense Refill   allopurinol  (ZYLOPRIM ) 100 MG tablet TAKE ONE TABLET (100 MG TOTAL) BY MOUTH DAILY. 90 tablet 1   baclofen  (LIORESAL ) 10 MG tablet TAKE ONE TABLET (10 MG TOTAL) BY MOUTH TWO TIMES DAILY. 180 tablet 1   colchicine  0.6 MG tablet TAKE ONE TABLET BY MOUTH TWICE A DAY 180 tablet 1   ELIQUIS  5 MG TABS tablet TAKE ONE TABLET BY MOUTH TWO TIMES DAILY 180 tablet 1   flecainide  (TAMBOCOR ) 100 MG tablet TAKE (1) ONE TABLET BY MOUTH (2) TWICE DAILY 180 tablet 1   furosemide  (LASIX ) 20 MG tablet TAKE ONE TABLET (20 MG TOTAL) BY MOUTH DAILY AS NEEDED. 90 tablet 0   Multiple Vitamins-Minerals (MULTIVITAMIN WITH MINERALS) tablet Take 1 tablet by mouth daily.     nortriptyline (PAMELOR) 10 MG capsule Take 10 mg  nightly for 1 week, then increase to 20 mg nightly. PLEASE MAKE SURE TO KEEP CARDIO APPT for EKG.     olmesartan  (BENICAR ) 20 MG tablet TAKE ONE TABLET (20 MG TOTAL) BY MOUTH DAILY. 30 tablet 0  pantoprazole  (PROTONIX ) 40 MG tablet TAKE ONE TABLET BY MOUTH ONCE A DAY 90 tablet 0   Sennosides-Docusate Sodium  (SM STOOL SOFTENER/LAXATIVE PO) Take 8.6 mg by mouth daily.     topiramate  (TOPAMAX ) 25 MG tablet TAKE ONE TABLET (25 MG TOTAL) BY MOUTH AT BEDTIME. 90 tablet 0   traMADol  (ULTRAM ) 50 MG tablet Take 1 tablet (50 mg total) by mouth daily as needed. 30 tablet 0   No current facility-administered medications for this visit.    Allergies  Allergen Reactions   Penicillins Anaphylaxis and Other (See Comments)    From childhood; uncertain of reaction Has patient had a PCN reaction causing immediate rash, facial/tongue/throat swelling, SOB or lightheadedness with hypotension: Unk Has patient had a PCN reaction causing severe rash involving mucus membranes or skin necrosis: Unk Has patient had a PCN reaction that required hospitalization: Unk Has patient had a PCN reaction occurring within the  last 10 years: No If all of the above answers are "NO", then may proceed with Cephalosporin use.   Vancomycin  Other (See Comments)    Red Mans' syndrome   Penicillins Other (See Comments)    Childhood   Vancomycin  Other (See Comments)    Red man Syndrome    Family History  Problem Relation Age of Onset   Coronary artery disease Mother    Hyperlipidemia Mother    Hypertension Mother    Arthritis Mother    Coronary artery disease Father    Heart disease Paternal Grandmother    Alzheimer's disease Paternal Grandfather     Social History   Socioeconomic History   Marital status: Married    Spouse name: Nathanyl Andujo   Number of children: 2   Years of education: 12   Highest education level: Not on file  Occupational History   Occupation: Acupuncturist: Engineer, civil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) [employer]  Tobacco Use   Smoking status: Never   Smokeless tobacco: Never  Vaping Use   Vaping status: Never Used  Substance and Sexual Activity   Alcohol use: Not Currently    Comment: \   Drug use: No   Sexual activity: Yes  Other Topics Concern   Not on file  Social History Narrative   ** Merged History Encounter **       Social Drivers of Health   Financial Resource Strain: Medium Risk (01/07/2023)   Received from [facility]   Overall Financial Resource Strain (CARDIA)    Difficulty of Paying Living Expenses: Somewhat hard  Food Insecurity: Food Insecurity Present (01/07/2023)   Received from [facility]   Hunger Vital Sign    Worried About Running Out of Food in the Last Year: Sometimes true    Ran Out of Food in the Last Year: Sometimes true  Transportation Needs: No Transportation Needs (01/07/2023)   [facility] - Transportation    In the past 12 months, has lack of transportation kept you from medical appointments or from getting medications?: No    Lack of Transportation (Non-Medical): No  Physical Activity:  Sufficiently Active (08/08/2022)   Exercise Vital Sign    Days of Exercise per Week: 7 days    Minutes of Exercise per Session: 40 min  Stress: No Stress Concern Present (08/08/2022)   [employer] of Occupational Health - Occupational Stress Questionnaire    Feeling of Stress : Not at all  Social Connections: Moderately Integrated (08/08/2022)   Social Connection and Isolation Panel [  NHANES]    Frequency of Communication with Friends and Family: More than three times a week    Frequency of Social Gatherings with Friends and Family: More than three times a week    Attends Religious Services: 1 to 4 times per year    Active Member of [employer] or Organizations: No    Attends Banker Meetings: Never    Marital Status: Married  Catering manager Violence: Not At Risk (12/10/2022)   Humiliation, Afraid, Rape, and Kick questionnaire    Fear of Current or Ex-Partner: No    Emotionally Abused: No    Physically Abused: No    Sexually Abused: No     Constitutional: Denies fever, malaise, fatigue, headache or abrupt weight changes.  HEENT: Denies eye pain, eye redness, ear pain, ringing in the ears, wax buildup, runny nose, nasal congestion, bloody nose, or sore throat. Respiratory: Denies difficulty breathing, shortness of breath, cough or sputum production.   Cardiovascular: Denies chest pain, chest tightness, palpitations or swelling in the hands or feet.  Musculoskeletal: Denies decrease in range of motion, difficulty with gait, muscle pain or joint pain and swelling.  Skin: Patient reports red area to right arm.  Denies ulcercations.    No other specific complaints in a complete review of systems (except as listed in HPI above).      Objective:   Physical Exam  BP 118/74 (BP Location: Left Arm, Patient Position: Sitting, Cuff Size: Large)   Ht 5\' 9"  (1.753 m)   Wt 248 lb (112.5 kg)   BMI 36.62 kg/m   Wt Readings from Last 3 Encounters:  05/01/23 252 lb (114.3 kg)   01/16/23 252 lb 2 oz (114.4 kg)  12/23/22 247 lb 6.4 oz (112.2 kg)    General: Appears his stated age, obese, in NAD. Skin: 5 cm x 3 cm area of induration, raised and warm to the medial aspect of the right upper extremity.  Cardiovascular: Normal rate and rhythm. S1,S2 noted.  No murmur, rubs or gallops noted.  Radial pulse 2+ on the right. Pulmonary/Chest: Normal effort and positive vesicular breath sounds. No respiratory distress. No wheezes, rales or ronchi noted.  Musculoskeletal: Normal internal and external rotation of the right shoulder. No signs of joint swelling. No difficulty with gait.  Neurological: Alert and oriented.   BMET    Component Value Date/Time   NA 141 12/12/2022 0437   NA 145 (H) 02/05/2021 0909   K 3.8 12/12/2022 0437   CL 106 12/12/2022 0437   CO2 26 12/12/2022 0437   GLUCOSE 88 12/12/2022 0437   BUN 24 (H) 12/12/2022 0437   BUN 16 02/05/2021 0909   CREATININE 1.38 (H) 12/12/2022 0437   CREATININE 1.32 11/08/2022 0948   CALCIUM 9.1 12/12/2022 0437   GFRNONAA 58 (L) 12/12/2022 0437   GFRAA >60 10/19/2019 1337   GFRAA >60 10/19/2019 1337    Lipid Panel     Component Value Date/Time   CHOL 157 11/08/2022 0948   CHOL 143 02/05/2021 0909   TRIG 71 11/08/2022 0948   HDL 47 11/08/2022 0948   HDL 48 02/05/2021 0909   CHOLHDL 3.3 11/08/2022 0948   VLDL 22.4 01/17/2020 1214   LDLCALC 94 11/08/2022 0948    CBC    Component Value Date/Time   WBC 7.1 12/12/2022 0437   RBC 5.00 12/12/2022 0437   HGB 15.8 12/12/2022 0437   HGB 16.2 02/05/2021 0909   HCT 45.2 12/12/2022 0437   HCT 45.5 02/05/2021 0909  PLT 159 12/12/2022 0437   PLT 195 02/05/2021 0909   MCV 90.4 12/12/2022 0437   MCV 88 02/05/2021 0909   MCH 31.6 12/12/2022 0437   MCHC 35.0 12/12/2022 0437   RDW 12.9 12/12/2022 0437   RDW 12.6 02/05/2021 0909   LYMPHSABS 2.1 12/12/2022 0437   LYMPHSABS 1.6 10/31/2017 1438   MONOABS 0.7 12/12/2022 0437   EOSABS 0.3 12/12/2022 0437   EOSABS  0.2 10/31/2017 1438   BASOSABS 0.1 12/12/2022 0437   BASOSABS 0.0 10/31/2017 1438    Hgb A1C Lab Results  Component Value Date   HGBA1C 5.1 11/08/2022            Assessment & Plan:  Assessment and Plan    Cellulitis of right upper extremity Suspected insect bite leading to cellulitis. Penicillin allergy requires alternative antibiotic. - Prescribed Septra  DS (sulfamethoxazole  and trimethoprim ) twice daily for 7 days. - Advised continuation of diphenhydramine  25 or 50 mg every 8 hours as needed for pruritus. - Recommended hydrocortisone cream for pruritus. - Instructed to monitor lesion and report if no improvement post-antibiotic course.     Schedule an appointment for your annual exam Helayne Lo, NP

## 2023-06-30 NOTE — Patient Instructions (Signed)
 Cellulitis, Adult    Cellulitis is a skin infection. The infected area is often warm, red, swollen, and sore. It occurs most often on the legs, feet, and toes, but can happen on any part of the body.  This condition can be life-threatening without treatment. It is very important to get treated right away.  What are the causes?  This condition is caused by bacteria. The bacteria enter through a break in the skin, such as:  A cut.  A burn.  A bug bite.  An animal bite.  An open sore.  A crack.  What increases the risk?  Having a weak body's defense system (immune system).  Being older than 62 years old.  Having a blood sugar problem (diabetes).  Having a long-term liver disease (cirrhosis) or kidney disease.  Being very overweight (obese).  Having a skin problem, such as:  An itchy rash.  A rash caused by a fungus.  A rash with blisters.  Slow movement of blood in the veins (venous stasis).  Fluid buildup below the skin (edema).  This condition is more likely to occur in people who:  Have open cuts, burns, bites, or scrapes on the skin.  Have been treated with high-energy rays (radiation).  Use IV drugs.  What are the signs or symptoms?  Skin that:  Looks red or purple, or slightly darker than your usual skin color.  Has streaks.  Has spots.  Is swollen.  Is sore or painful when you touch it.  Is warm.  A fever.  Chills.  Blisters.  Tiredness (fatigue).  How is this treated?  Medicines to treat infections or allergies.  Rest.  Placing cold or warm cloths on the skin.  Staying in the hospital, if the condition is very bad. You may need medicines through an IV.  Follow these instructions at home:  Medicines  Take over-the-counter and prescription medicines only as told by your doctor.  If you were prescribed antibiotics, take them as told by your doctor. Do not stop using them even if you start to feel better.  General instructions  Drink enough fluid to keep your pee (urine) pale yellow.  Do not touch or rub the  infected area.  Raise (elevate) the infected area above the level of your heart while you are sitting or lying down.  Return to your normal activities when your doctor says that it is safe.  Place cold or warm cloths on the area as told by your doctor.  Keep all follow-up visits. Your doctor will need to make sure that a more serious infection is not developing.  Contact a doctor if:  You have a fever.  You do not start to get better after 1-2 days of treatment.  Your bone or joint under the infected area starts to hurt after the skin has healed.  Your infection comes back in the same area or another area. Signs of this may include:  You have a swollen bump in the area.  Your red area gets larger, turns dark in color, or hurts more.  You have more fluid coming from the wound.  Pus or a bad smell develops in your infected area.  You have more pain.  You feel sick and have muscle aches and weakness.  You develop vomiting or watery poop that will not go away.  Get help right away if:  You see red streaks coming from the area.  You notice the skin turns purple or black and falls  off.  These symptoms may be an emergency. Get help right away. Call 911.  Do not wait to see if the symptoms will go away.  Do not drive yourself to the hospital.  This information is not intended to replace advice given to you by your health care provider. Make sure you discuss any questions you have with your health care provider.  Document Revised: 09/04/2021 Document Reviewed: 09/04/2021  Elsevier Patient Education  2024 [employer].

## 2023-07-01 ENCOUNTER — Other Ambulatory Visit: Payer: Self-pay | Admitting: Internal Medicine

## 2023-07-01 ENCOUNTER — Ambulatory Visit: Admitting: Internal Medicine

## 2023-07-02 NOTE — Telephone Encounter (Signed)
 Requested Prescriptions  Pending Prescriptions Disp Refills   olmesartan  (BENICAR ) 20 MG tablet [Pharmacy Med Name: OLMESARTAN  MEDOXOMIL 20MG  TABLET] 90 tablet 0    Sig: TAKE ONE TABLET (20 MG TOTAL) BY MOUTH DAILY.     Cardiovascular:  Angiotensin Receptor Blockers Failed - 07/02/2023 12:10 PM      Failed - Cr in normal range and within 180 days    Creat  Date Value Ref Range Status  11/08/2022 1.32 0.70 - 1.35 mg/dL Final   Creatinine, Ser  Date Value Ref Range Status  12/12/2022 1.38 (H) 0.61 - 1.24 mg/dL Final         Failed - K in normal range and within 180 days    Potassium  Date Value Ref Range Status  12/12/2022 3.8 3.5 - 5.1 mmol/L Final         Passed - Patient is not pregnant      Passed - Last BP in normal range    BP Readings from Last 1 Encounters:  06/30/23 118/74         Passed - Valid encounter within last 6 months    Recent Outpatient Visits           2 days ago Cellulitis of right upper extremity   [place] [facility] [place], Rankin Buzzard, NP   2 months ago Primary hypertension   [place] [facility] [place], Rankin Buzzard, NP       Future Appointments             In 1 week Wenona Hamilton, MD [facility] at [facility]

## 2023-07-15 ENCOUNTER — Encounter: Payer: Self-pay | Admitting: Cardiovascular Disease

## 2023-07-15 ENCOUNTER — Ambulatory Visit: Payer: Medicare Other | Attending: Cardiovascular Disease | Admitting: Cardiovascular Disease

## 2023-07-15 VITALS — BP 90/70 | HR 71 | Ht 69.0 in | Wt 250.0 lb

## 2023-07-15 DIAGNOSIS — I480 Paroxysmal atrial fibrillation: Secondary | ICD-10-CM

## 2023-07-15 DIAGNOSIS — I10 Essential (primary) hypertension: Secondary | ICD-10-CM | POA: Diagnosis not present

## 2023-07-15 MED ORDER — OLMESARTAN MEDOXOMIL 20 MG PO TABS: 10.0000 mg | ORAL_TABLET | Freq: Every day | ORAL | 3 refills | Status: DC

## 2023-07-15 NOTE — Progress Notes (Signed)
 Cardiology Office Note   Date:  07/15/2023   ID:  Richard [name], DOB [birth year], MRN [id number]  PCP:  Antonette Angeline ORN, NP  Cardiologist:   Deatrice Cage, MD   Chief Complaint  Patient presents with   Follow-up    6 month f/u no complaints today. Pt didn't bring medications and vaguely remembers medications pt's wife handles medications.      History of Present Illness: Richard [name] is a 63 y.o. male who presents for a follow-up visit regarding paroxysmal atrial fibrillation . The patient has past medical history of essential hypertension, obesity status post gastric sleeve surgery, osteoarthritis status post bilateral knee surgeries.  Previous cardiac catheterization 2015 showed normal coronary arteries.  The patient had multiple knee surgeries starting in 2016. He developed left lower extremity DVT complicated by massive pulmonary embolism which was treated with catheter-based intervention and IVC filter placement.  He had atrial fibrillation at that time but converted to sinus rhythm. He had intermittent palpitations thought to be due to atrial fibrillation and has been maintaining in sinus rhythm with flecainide . Lifelong anticoagulation has been recommended.  His atrial fibrillation is being treated with flecainide  and anticoagulation.  Metoprolol  was discontinued in the past due to symptomatic bradycardia.    He was hospitalized in November, 2024 for blurred vision.  CTA of the neck showed no significant obstructive disease.  MRI of the brain with no abnormalities.  He was referred to ENT and neurology.  He had an echocardiogram while hospitalized which showed normal LV systolic function with no significant valvular abnormalities.  He was seen by neurology and is suspected of having ocular migraines.    He had elevated blood pressure in April.  He was started on olmesartan  20 mg daily.  Blood pressure improved after that and if anything, he has been having increased  orthostatic dizziness.  He denies chest pain or worsening dyspnea.    Past Medical History:  Diagnosis Date   Arthritis    Arthrofibrosis of total knee arthroplasty (HCC) 05/30/2015   Bilateral pulmonary embolism (HCC)    a. 03/2015 CTA: acute bilat PE; b. IVC filter placed in 2017--> removed 2018.   Chest pain    a. 2015 Abnl MV;  b. 2015 Cath: nl cors.   Chronic diastolic CHF (congestive heart failure) (HCC)    a. 03/2015 Echo: EF 55-65%, poor windows, mildly dil RV.   DVT (deep venous thrombosis) (HCC)    a. 03/2015 U/S: occlusive DVT w/in the distal aspect of the Left fem vein through the L popliteal vein.   Dyspnea    On exertion   GERD (gastroesophageal reflux disease)    Headache    Hypertension    Hypertensive heart disease    Obesity    OSA (obstructive sleep apnea)    uses CPAP nightly settings at 15    PAF (paroxysmal atrial fibrillation) (HCC)    Pneumonia    hx of years ago    Primary localized osteoarthritis of left knee    a. 11/2014 s/p L TKA.   Primary localized osteoarthritis of right knee    a. 02/2014 s/p R Partial Knee arthroplasty.   Stiffness of left knee    a. 12/2014 s/p manipulation under anesthesia.   Urine incontinence     Past Surgical History:  Procedure Laterality Date   APPENDECTOMY     CARDIAC CATHETERIZATION     CARPAL TUNNEL RELEASE Left 03/2018   CYSTOSCOPY W/ RETROGRADES  12/09/2019  Procedure: CYSTOSCOPY WITH RETROGRADE PYELOGRAM;  Surgeon: Kassie Ozell SAUNDERS, MD;  Location: [facility];  Service: Urology;;   CYSTOSCOPY WITH STENT PLACEMENT Right 12/09/2019   Procedure: CYSTOSCOPY WITH STENT PLACEMENT;  Surgeon: Kassie Ozell SAUNDERS, MD;  Location: [facility];  Service: Urology;  Laterality: Right;   EXAM UNDER ANESTHESIA WITH MANIPULATION OF KNEE Left 05/30/2015   Procedure: EXAM UNDER ANESTHESIA WITH MANIPULATION OF LEFT KNEE;  Surgeon: Fonda Olmsted, MD;  Location: [facility];  Service: Orthopedics;  Laterality: Left;   HIATAL HERNIA REPAIR N/A  10/26/2019   Procedure: HIATAL HERNIA REPAIR;  Surgeon: Gladis Cough, MD;  Location: [facility];  Service: General;  Laterality: N/A;   I & D KNEE WITH POLY EXCHANGE Left 10/30/2015   Procedure: IRRIGATION AND DEBRIDEMENT KNEE WITH POLY EXCHANGE PLACE SPACERS;  Surgeon: Dempsey Sensor, MD;  Location: [facility];  Service: Orthopedics;  Laterality: Left;  OFF ELIQUIST 3 DAYS   IVC FILTER PLACEMENT ([facility] HX)  04/13/15   IVC FILTER REMOVAL N/A 08/13/2016   Procedure: IVC Filter Removal;  Surgeon: Jama Cordella MATSU, MD;  Location: [facility];  Service: Cardiovascular;  Laterality: N/A;   JOINT REPLACEMENT     KNEE ARTHROSCOPY Left 08/08/2015   Procedure: LEFT KNEE MANIPULATION WITH ARTHROSCOPIC LYSIS OF ADHESIONS AND ASPIRATION;  Surgeon: Fonda Olmsted, MD;  Location: [facility];  Service: Orthopedics;  Laterality: Left;   KNEE CLOSED REDUCTION Left 01/20/2015   Procedure: LEFT KNEE MANIPULATION;  Surgeon: Fonda Olmsted, MD;  Location: [facility];  Service: Orthopedics;  Laterality: Left;   LAPAROSCOPIC GASTRIC SLEEVE RESECTION N/A 10/26/2019   Procedure: LAPAROSCOPIC SLEEVE GASTRECTOMY;  Surgeon: Gladis Cough, MD;  Location: [facility];  Service: General;  Laterality: N/A;   LEFT HEART CATHETERIZATION WITH CORONARY ANGIOGRAM N/A 01/12/2014   Procedure: LEFT HEART CATHETERIZATION WITH CORONARY ANGIOGRAM;  Surgeon: Candyce GORMAN Reek, MD;  Location: [facility];  Service: Cardiovascular;  Laterality: N/A;   ORTHOPEDIC SURGERY Left    arthroscopy x3   PARTIAL KNEE ARTHROPLASTY Right 02/25/2014   Procedure: RIGHT KNEE ARTHROPLASTY CONDYLE AND PLATEAU MEDIAL COMPARTMENT ;  Surgeon: Fonda SHAUNNA Olmsted, MD;  Location: [facility];  Service: Orthopedics;  Laterality: Right;   PERIPHERAL VASCULAR CATHETERIZATION N/A 03/29/2015   Procedure: IVC Filter Insertion, and possible thrombectomy;  Surgeon: Cordella MATSU Jama, MD;  Location: [facility];  Service: Cardiovascular;  Laterality:  N/A;   ROTATOR CUFF REPAIR Left    TOTAL KNEE ARTHROPLASTY  12/06/2014   Procedure: TOTAL KNEE ARTHROPLASTY;  Surgeon: Fonda Olmsted, MD;  Location: [facility];  Service: Orthopedics;;   TOTAL KNEE REVISION Left 02/08/2016   Procedure: TOTAL KNEE REVISION;  Surgeon: Dempsey Sensor, MD;  Location: [facility];  Service: Orthopedics;  Laterality: Left;   UPPER GI ENDOSCOPY N/A 10/26/2019   Procedure: UPPER GI ENDOSCOPY;  Surgeon: Gladis Cough, MD;  Location: [facility];  Service: General;  Laterality: N/A;   URETEROSCOPY WITH HOLMIUM LASER LITHOTRIPSY Right 12/30/2019   Procedure: URETEROSCOPY WITH HOLMIUM LASER LITHOTRIPSY;  Surgeon: Kassie Ozell SAUNDERS, MD;  Location: [facility];  Service: Urology;  Laterality: Right;     Current Outpatient Medications  Medication Sig Dispense Refill   allopurinol  (ZYLOPRIM ) 100 MG tablet TAKE ONE TABLET (100 MG TOTAL) BY MOUTH DAILY. 90 tablet 1   baclofen  (LIORESAL ) 10 MG tablet TAKE ONE TABLET (10 MG TOTAL) BY MOUTH TWO TIMES DAILY. 180 tablet 1   colchicine  0.6 MG tablet TAKE ONE TABLET BY MOUTH TWICE  A DAY 180 tablet 1   ELIQUIS  5 MG TABS tablet TAKE ONE TABLET BY MOUTH TWO TIMES DAILY 180 tablet 1   flecainide  (TAMBOCOR ) 100 MG tablet TAKE (1) ONE TABLET BY MOUTH (2) TWICE DAILY 180 tablet 1   furosemide  (LASIX ) 20 MG tablet TAKE ONE TABLET (20 MG TOTAL) BY MOUTH DAILY AS NEEDED. 90 tablet 0   Multiple Vitamins-Minerals (MULTIVITAMIN WITH MINERALS) tablet Take 1 tablet by mouth daily.     olmesartan  (BENICAR ) 20 MG tablet TAKE ONE TABLET (20 MG TOTAL) BY MOUTH DAILY. 90 tablet 0   pantoprazole  (PROTONIX ) 40 MG tablet TAKE ONE TABLET BY MOUTH ONCE A DAY 90 tablet 0   Sennosides-Docusate Sodium  (SM STOOL SOFTENER/LAXATIVE PO) Take 8.6 mg by mouth daily.     topiramate  (TOPAMAX ) 25 MG tablet TAKE ONE TABLET (25 MG TOTAL) BY MOUTH AT BEDTIME. 90 tablet 0   traMADol  (ULTRAM ) 50 MG tablet Take 1 tablet (50 mg total) by mouth daily as needed. 30 tablet 0   venlafaxine (EFFEXOR)  37.5 MG tablet Take 37.5 mg by mouth daily.     nortriptyline (PAMELOR) 10 MG capsule Take 10 mg  nightly for 1 week, then increase to 20 mg nightly. PLEASE MAKE SURE TO KEEP CARDIO APPT for EKG. (Patient not taking: Reported on 07/15/2023)     No current facility-administered medications for this visit.    Allergies:   Penicillins, Vancomycin , Penicillins, and Vancomycin     Social History:  The patient  reports that he has never smoked. He has never used smokeless tobacco. He reports that he does not currently use alcohol. He reports that he does not use drugs.   Family History:  The patient's family history includes Alzheimer's disease in his paternal grandfather; Arthritis in his mother; Coronary artery disease in his father and mother; Heart disease in his paternal grandmother; Hyperlipidemia in his mother; Hypertension in his mother.    ROS:  Please see the history of present illness.   Otherwise, review of systems are positive for none.   All other systems are reviewed and negative.    PHYSICAL EXAM: VS:  BP 90/70 (BP Location: Left Arm, Patient Position: Sitting, Cuff Size: Large)   Pulse 71   Ht 5' 9 (1.753 m)   Wt 250 lb (113.4 kg)   SpO2 98%   BMI 36.92 kg/m  , BMI Body mass index is 36.92 kg/m. GEN: Well nourished, well developed, in no acute distress  HEENT: normal  Neck: no JVD, carotid bruits, or masses Cardiac: RRR; no murmurs, rubs, or gallops,no edema  Respiratory:  clear to auscultation bilaterally, normal work of breathing GI: soft, nontender, nondistended, + BS MS: no deformity or atrophy  Skin: warm and dry, no rash Neuro:  Strength and sensation are intact Psych: euthymic mood, full affect   EKG:  EKG is ordered today. The ekg ordered today demonstrates : Normal sinus rhythm with sinus arrhythmia Left anterior fascicular block When compared with ECG of 16-Jan-2023 14:54, No significant change was found   Recent Labs: 11/08/2022: ALT 41 12/12/2022:  BUN 24; Creatinine, Ser 1.38; Hemoglobin 15.8; Platelets 159; Potassium 3.8; Sodium 141    Lipid Panel    Component Value Date/Time   CHOL 157 11/08/2022 0948   CHOL 143 02/05/2021 0909   TRIG 71 11/08/2022 0948   HDL 47 11/08/2022 0948   HDL 48 02/05/2021 0909   CHOLHDL 3.3 11/08/2022 0948   VLDL 22.4 01/17/2020 1214   LDLCALC 94 11/08/2022 0948  Wt Readings from Last 3 Encounters:  07/15/23 250 lb (113.4 kg)  06/30/23 248 lb (112.5 kg)  05/01/23 252 lb (114.3 kg)       ASSESSMENT AND PLAN:  1.  Paroxysmal atrial fibrillation:  No evidence of recurrent arrhythmia on flecainide . He is tolerating anticoagulation with Eliquis .  Most recent labs in November showed a creatinine of 1.38 and normal CBC.  2. history of pulmonary embolism: The patient is on lifelong anticoagulation for this as well as paroxysmal atrial fibrillation.    3.  Status post gastric sleeve surgery for morbid obesity: His weight is stable overall.  4.  Obstructive sleep apnea: He is compliant with CPAP at night.  5.  Essential hypertension: He had elevated blood pressure in April and was started on olmesartan .  However, he has been having low blood pressure readings recently.  He complains of orthostatic dizziness and he is mildly hypotensive today.  I asked him to take half a tablet of olmesartan  daily.   Disposition:   FU with me in 6 months  Signed,  Deatrice Cage, MD  07/15/2023 4:15 PM    [facility]

## 2023-07-15 NOTE — Patient Instructions (Signed)
 Medication Instructions:  Your physician recommends the following medication changes.  DECREASE: Olmesartan  10 mg once daily  *If you need a refill on your cardiac medications before your next appointment, please call your pharmacy*  Lab Work: None ordered at this time  If you have labs (blood work) drawn today and your tests are completely normal, you will receive your results only by: MyChart Message (if you have MyChart) OR A paper copy in the mail If you have any lab test that is abnormal or we need to change your treatment, we will call you to review the results.  Testing/Procedures: None ordered at this time   Follow-Up: At [facility], you and your health needs are our priority.  As part of our continuing mission to provide you with exceptional heart care, our providers are all part of one team.  This team includes your primary Cardiologist (physician) and Advanced Practice Providers or APPs (Physician Assistants and Nurse Practitioners) who all work together to provide you with the care you need, when you need it.  Your next appointment:   6 month(s)  Provider:   You will see one of the following Advanced Practice Providers on your designated Care Team:   Lonni Meager, NP Lesley Maffucci, PA-C Bernardino Bring, PA-C Cadence Pettibone, PA-C Tylene Lunch, NP Barnie Hila, NP  We recommend signing up for the patient portal called MyChart.  Sign up information is provided on this After Visit Summary.  MyChart is used to connect with patients for Virtual Visits (Telemedicine).  Patients are able to view lab/test results, encounter notes, upcoming appointments, etc.  Non-urgent messages can be sent to your provider as well.   To learn more about what you can do with MyChart, go to [email or web address].

## 2023-07-21 ENCOUNTER — Other Ambulatory Visit: Payer: Self-pay | Admitting: Cardiovascular Disease

## 2023-07-21 DIAGNOSIS — I480 Paroxysmal atrial fibrillation: Secondary | ICD-10-CM

## 2023-07-21 DIAGNOSIS — I82492 Acute embolism and thrombosis of other specified deep vein of left lower extremity: Secondary | ICD-10-CM

## 2023-07-21 DIAGNOSIS — I2699 Other pulmonary embolism without acute cor pulmonale: Secondary | ICD-10-CM

## 2023-07-21 DIAGNOSIS — R0789 Other chest pain: Secondary | ICD-10-CM

## 2023-07-24 ENCOUNTER — Other Ambulatory Visit: Payer: Self-pay | Admitting: Internal Medicine

## 2023-07-24 DIAGNOSIS — M150 Primary generalized (osteo)arthritis: Secondary | ICD-10-CM

## 2023-07-28 NOTE — Telephone Encounter (Signed)
 Requested medication (s) are due for refill today:   Provider to review  Requested medication (s) are on the active medication list:   Yes  Future visit scheduled:   Yes 7/24 for AWV   Last ordered: 06/27/2023 #30,0 refills  Non delegated refill    Requested Prescriptions  Pending Prescriptions Disp Refills   traMADol  (ULTRAM ) 50 MG tablet [Pharmacy Med Name: TRAMADOL  HYDROCHLORIDE 50MG  TABLET] 30 tablet 0    Sig: TAKE ONE TABLET (50 MG TOTAL) BY MOUTH DAILY AS NEEDED.     Not Delegated - Analgesics:  Opioid Agonists Failed - 07/28/2023  3:06 PM      Failed - This refill cannot be delegated      Failed - Urine Drug Screen completed in last 360 days      Passed - Valid encounter within last 3 months    Recent Outpatient Visits           4 weeks ago Cellulitis of right upper extremity   [place] [facility] [place], Angeline ORN, NP   2 months ago Primary hypertension   [place] [facility] [place], Angeline ORN, [place]

## 2023-08-11 ENCOUNTER — Ambulatory Visit: Payer: Self-pay

## 2023-08-11 ENCOUNTER — Encounter: Payer: Self-pay | Admitting: Internal Medicine

## 2023-08-11 ENCOUNTER — Other Ambulatory Visit: Payer: Self-pay | Admitting: Internal Medicine

## 2023-08-11 ENCOUNTER — Ambulatory Visit (INDEPENDENT_AMBULATORY_CARE_PROVIDER_SITE_OTHER): Admitting: Internal Medicine

## 2023-08-11 VITALS — Ht 69.0 in | Wt 246.8 lb

## 2023-08-11 DIAGNOSIS — E66812 Obesity, class 2: Secondary | ICD-10-CM

## 2023-08-11 DIAGNOSIS — I951 Orthostatic hypotension: Secondary | ICD-10-CM | POA: Diagnosis not present

## 2023-08-11 DIAGNOSIS — G43109 Migraine with aura, not intractable, without status migrainosus: Secondary | ICD-10-CM | POA: Diagnosis not present

## 2023-08-11 DIAGNOSIS — Z6836 Body mass index (BMI) 36.0-36.9, adult: Secondary | ICD-10-CM

## 2023-08-11 NOTE — Telephone Encounter (Signed)
 Will discuss at upcoming appointment

## 2023-08-11 NOTE — Patient Instructions (Signed)
 Orthostatic Hypotension Blood pressure is a measurement of how strongly, or weakly, your circulating blood is pressing against the walls of your arteries. Orthostatic hypotension is a drop in blood pressure that can happen when you change positions, such as when you go from lying down to standing. Arteries are blood vessels that carry blood from your heart throughout your body. When blood pressure is too low, you may not get enough blood to your brain or to the rest of your organs. Orthostatic hypotension can cause light-headedness, sweating, rapid heartbeat, blurred vision, and fainting. These symptoms require further investigation into the cause. What are the causes? Orthostatic hypotension can be caused by many things, including: Sudden changes in posture, such as standing up quickly after you have been sitting or lying down. Loss of blood (anemia) or loss of body fluids (dehydration). Heart problems, neurologic problems, or hormone problems. Pregnancy. Aging. The risk for this condition increases as you get older. Severe infection (sepsis). Certain medicines, such as medicines for high blood pressure or medicines that make the body lose excess fluids (diuretics). What are the signs or symptoms? Symptoms of this condition may include: Weakness, light-headedness, or dizziness. Sweating. Blurred vision. Tiredness (fatigue). Rapid heartbeat. Fainting, in severe cases. How is this diagnosed? This condition is diagnosed based on: Your symptoms and medical history. Your blood pressure measurements. Your health care provider will check your blood pressure when you are: Lying down. Sitting. Standing. A blood pressure reading is recorded as two numbers, such as "120 over 80" (or 120/80). The first ("top") number is called the systolic pressure. It is a measure of the pressure in your arteries as your heart beats. The second ("bottom") number is called the diastolic pressure. It is a measure of  the pressure in your arteries when your heart relaxes between beats. Blood pressure is measured in a unit called mmHg. Healthy blood pressure for most adults is 120/80 mmHg. Orthostatic hypotension is defined as a 20 mmHg drop in systolic pressure or a 10 mmHg drop in diastolic pressure within 3 minutes of standing. Other information or tests that may be used to diagnose orthostatic hypotension include: Your other vital signs, such as your heart rate and temperature. Blood tests. An electrocardiogram (ECG) or echocardiogram. A Holter monitor. This is a device you wear that records your heart rhythm continuously, usually for 24-48 hours. Tilt table test. For this test, you will be safely secured to a table that moves you from a lying position to an upright position. Your heart rhythm and blood pressure will be monitored during the test. How is this treated? This condition may be treated by: Changing your diet. This may involve eating more salt (sodium) or drinking more water. Changing the dosage of certain medicines you are taking that might be lowering your blood pressure. Correcting the underlying reason for the orthostatic hypotension. Wearing compression stockings. Taking medicines to raise your blood pressure. Avoiding actions that trigger symptoms. Follow these instructions at home: Medicines Take over-the-counter and prescription medicines only as told by your health care provider. Follow instructions from your health care provider about changing the dosage of your current medicines, if this applies. Do not stop or adjust any of your medicines on your own. Eating and drinking  Drink enough fluid to keep your urine pale yellow. Eat extra salt only as directed. Do not add extra salt to your diet unless advised by your health care provider. Eat frequent, small meals. Avoid standing up suddenly after eating. General instructions  Get up slowly from lying down or sitting positions. This  gives your blood pressure a chance to adjust. Avoid hot showers and excessive heat as directed by your health care provider. Engage in regular physical activity as directed by your health care provider. If you have compression stockings, wear them as told. Keep all follow-up visits. This is important. Contact a health care provider if: You have a fever for more than 2-3 days. You feel more thirsty than usual. You feel dizzy or weak. Get help right away if: You have chest pain. You have a fast or irregular heartbeat. You become sweaty or feel light-headed. You feel short of breath. You faint. You have any symptoms of a stroke. "BE FAST" is an easy way to remember the main warning signs of a stroke: B - Balance. Signs are dizziness, sudden trouble walking, or loss of balance. E - Eyes. Signs are trouble seeing or a sudden change in vision. F - Face. Signs are sudden weakness or numbness of the face, or the face or eyelid drooping on one side. A - Arms. Signs are weakness or numbness in an arm. This happens suddenly and usually on one side of the body. S - Speech. Signs are sudden trouble speaking, slurred speech, or trouble understanding what people say. T - Time. Time to call emergency services. Write down what time symptoms started. You have other signs of a stroke, such as: A sudden, severe headache with no known cause. Nausea or vomiting. Seizure. These symptoms may represent a serious problem that is an emergency. Do not wait to see if the symptoms will go away. Get medical help right away. Call your local emergency services (911 in the [place] Do not drive yourself to the hospital. Summary Orthostatic hypotension is a sudden drop in blood pressure. It can cause light-headedness, sweating, rapid heartbeat, blurred vision, and fainting. Orthostatic hypotension can be diagnosed by having your blood pressure taken while lying down, sitting, and then standing. Treatment may involve  changing your diet, wearing compression stockings, sitting up slowly, adjusting your medicines, or correcting the underlying reason for the orthostatic hypotension. Get help right away if you have chest pain, a fast or irregular heartbeat, or symptoms of a stroke. This information is not intended to replace advice given to you by your health care provider. Make sure you discuss any questions you have with your health care provider. Document Revised: 03/23/2020 Document Reviewed: 03/23/2020 Elsevier Patient Education  2024 [employer].

## 2023-08-11 NOTE — Progress Notes (Signed)
 Subjective:    Patient ID: Richard [name], male    DOB: 09/24/1960, 63 y.o.   MRN: [id number]  HPI     Discussed the use of AI scribe software for clinical note transcription with the patient, who gave verbal consent to proceed.   Richard [name] is a 63 year old male with hypertension who presents with worsening dizziness and low blood pressure.  He has been experiencing dizziness that has worsened over the past month and a half, particularly with position changes. He describes feeling 'real wobbly' and needing to sit down to gather his thoughts. Although he has not passed out, he feels he has come close to it. He reports a recent blood pressure reading of 118/65 mmHg. He is currently taking olmesartan , which was reduced to 10 mg from 20 mg, and metoprolol  25 mg once daily. He also takes Lasix  as needed for fluid retention but not daily.  He reports headaches and vision changes occurring two to three times a week, though not as severe as before. This has been a chronic issues, attributed to ocular migraines. He has been seeing a neurologist but feels no progress has been made.  He mentions a high fluid intake, estimating he drank three gallons of water over the weekend, and describes feeling dehydrated despite this. He has been working outside, which exacerbates his symptoms, causing him to feel too hot and requiring frequent breaks. No swelling in his legs. He reports burning eyes and no recent episodes of passing out.        Review of Systems     Past Medical History:  Diagnosis Date   Arthritis    Arthrofibrosis of total knee arthroplasty (HCC) 05/30/2015   Bilateral pulmonary embolism (HCC)    a. 03/2015 CTA: acute bilat PE; b. IVC filter placed in 2017--> removed 2018.   Chest pain    a. 2015 Abnl MV;  b. 2015 Cath: nl cors.   Chronic diastolic CHF (congestive heart failure) (HCC)    a. 03/2015 Echo: EF 55-65%, poor windows, mildly dil RV.   DVT (deep venous thrombosis)  (HCC)    a. 03/2015 U/S: occlusive DVT w/in the distal aspect of the Left fem vein through the L popliteal vein.   Dyspnea    On exertion   GERD (gastroesophageal reflux disease)    Headache    Hypertension    Hypertensive heart disease    Obesity    OSA (obstructive sleep apnea)    uses CPAP nightly settings at 15    PAF (paroxysmal atrial fibrillation) (HCC)    Pneumonia    hx of years ago    Primary localized osteoarthritis of left knee    a. 11/2014 s/p L TKA.   Primary localized osteoarthritis of right knee    a. 02/2014 s/p R Partial Knee arthroplasty.   Stiffness of left knee    a. 12/2014 s/p manipulation under anesthesia.   Urine incontinence     Current Outpatient Medications  Medication Sig Dispense Refill   allopurinol  (ZYLOPRIM ) 100 MG tablet TAKE ONE TABLET (100 MG TOTAL) BY MOUTH DAILY. 90 tablet 1   baclofen  (LIORESAL ) 10 MG tablet TAKE ONE TABLET (10 MG TOTAL) BY MOUTH TWO TIMES DAILY. 180 tablet 1   colchicine  0.6 MG tablet TAKE ONE TABLET BY MOUTH TWICE A DAY 180 tablet 1   ELIQUIS  5 MG TABS tablet TAKE ONE TABLET BY MOUTH TWO TIMES DAILY 180 tablet 1   flecainide  (TAMBOCOR ) 100 MG  tablet TAKE ONE TABLET BY MOUTH TWICE DAILY 180 tablet 2   furosemide  (LASIX ) 20 MG tablet TAKE ONE TABLET (20 MG TOTAL) BY MOUTH DAILY AS NEEDED. 90 tablet 0   Multiple Vitamins-Minerals (MULTIVITAMIN WITH MINERALS) tablet Take 1 tablet by mouth daily.     nortriptyline (PAMELOR) 10 MG capsule Take 10 mg  nightly for 1 week, then increase to 20 mg nightly. PLEASE MAKE SURE TO KEEP CARDIO APPT for EKG. (Patient not taking: Reported on 07/15/2023)     olmesartan  (BENICAR ) 20 MG tablet Take 0.5 tablets (10 mg total) by mouth daily. 45 tablet 3   pantoprazole  (PROTONIX ) 40 MG tablet TAKE ONE TABLET BY MOUTH ONCE A DAY 90 tablet 0   Sennosides-Docusate Sodium  (SM STOOL SOFTENER/LAXATIVE PO) Take 8.6 mg by mouth daily.     topiramate  (TOPAMAX ) 25 MG tablet TAKE ONE TABLET (25 MG TOTAL) BY  MOUTH AT BEDTIME. 90 tablet 0   traMADol  (ULTRAM ) 50 MG tablet Take 1 tablet (50 mg total) by mouth daily as needed. 30 tablet 0   venlafaxine (EFFEXOR) 37.5 MG tablet Take 37.5 mg by mouth daily.     No current facility-administered medications for this visit.    Allergies  Allergen Reactions   Penicillins Anaphylaxis and Other (See Comments)    From childhood; uncertain of reaction Has patient had a PCN reaction causing immediate rash, facial/tongue/throat swelling, SOB or lightheadedness with hypotension: Unk Has patient had a PCN reaction causing severe rash involving mucus membranes or skin necrosis: Unk Has patient had a PCN reaction that required hospitalization: Unk Has patient had a PCN reaction occurring within the last 10 years: No If all of the above answers are NO, then may proceed with Cephalosporin use.   Vancomycin  Other (See Comments)    Red Mans' syndrome   Penicillins Other (See Comments)    Childhood   Vancomycin  Other (See Comments)    Red man Syndrome    Family History  Problem Relation Age of Onset   Coronary artery disease Mother    Hyperlipidemia Mother    Hypertension Mother    Arthritis Mother    Coronary artery disease Father    Heart disease Paternal Grandmother    Alzheimer's disease Paternal Grandfather     Social History   Socioeconomic History   Marital status: Married    Spouse name: Richard [name]   Number of children: 2   Years of education: 12   Highest education level: Not on file  Occupational History   Occupation: Acupuncturist: Engineer, civil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) [employer]  Tobacco Use   Smoking status: Never   Smokeless tobacco: Never  Vaping Use   Vaping status: Never Used  Substance and Sexual Activity   Alcohol use: Not Currently    Comment: \   Drug use: No   Sexual activity: Yes  Other Topics Concern   Not on file  Social History Narrative   ** Merged History Encounter **       Social Drivers of Health   Financial Resource Strain:  Medium Risk (01/07/2023)   [facility] from [facility]   Overall Financial Resource Strain (CARDIA)    Difficulty of Paying Living Expenses: Somewhat hard  Food Insecurity: Food Insecurity Present (01/07/2023)   Received from [facility]   Hunger Vital Sign    Within the past 12 months, you worried that your food would run out before you got the money to buy more.: Sometimes true  Within the past 12 months, the food you bought just didn't last and you didn't have money to get more.: Sometimes true  Transportation Needs: No Transportation Needs (01/07/2023)   Received from [facility] - Transportation    In the past 12 months, has lack of transportation kept you from medical appointments or from getting medications?: No    Lack of Transportation (Non-Medical): No  Physical Activity: Sufficiently Active (08/08/2022)   Exercise Vital Sign    Days of Exercise per Week: 7 days    Minutes of Exercise per Session: 40 min  Stress: No Stress Concern Present (08/08/2022)   [employer] of Occupational Health - Occupational Stress Questionnaire    Feeling of Stress : Not at all  Social Connections: Moderately Integrated (08/08/2022)   Social Connection and Isolation Panel    Frequency of Communication with Friends and Family: More than three times a week    Frequency of Social Gatherings with Friends and Family: More than three times a week    Attends Religious Services: 1 to 4 times per year    Active Member of [employer] or Organizations: No    Attends Banker Meetings: Never    Marital Status: Married  Catering manager Violence: Not At Risk (12/10/2022)   Humiliation, Afraid, Rape, and Kick questionnaire    Fear of Current or Ex-Partner: No    Emotionally Abused: No    Physically Abused: No    Sexually Abused: No     Constitutional:  Denies fever, malaise, fatigue, headache, or abrupt weight changes.  HEENT:  Denies eye pain, eye redness, ear pain, ringing in the ears, wax buildup, runny nose, nasal congestion, bloody nose, or sore throat. Respiratory: Denies difficulty breathing, shortness of breath, cough or sputum production.   Cardiovascular: Denies chest pain, chest tightness, palpitations or swelling in the hands or feet.  Musculoskeletal: Patient reports joint pain.  Denies decrease in range of motion, difficulty with gait, muscle pain or joint swelling.  Skin: Denies redness, rashes, lesions or ulcercations.  Neurological: Patient reports intermittent dizziness.  Denies difficulty with memory, difficulty with speech or problems with balance and coordination.  Psych: Denies anxiety, depression, SI/HI.  No other specific complaints in a complete review of systems (except as listed in HPI above).  Objective:   Physical Exam  Ht 5' 9 (1.753 m)   Wt 246 lb 12.8 oz (111.9 kg)   SpO2 96%   BMI 36.45 kg/m    Wt Readings from Last 3 Encounters:  07/15/23 250 lb (113.4 kg)  06/30/23 248 lb (112.5 kg)  05/01/23 252 lb (114.3 kg)    General: Appears his stated age, obese, in NAD. HEENT: Head: normal shape and size; Eyes: sclera white, no icterus, conjunctiva pink, PERRLA and EOMs intact;  Cardiovascular: Normal rate and rhythm. S1,S2 noted.  No murmur, rubs or gallops noted. No JVD or BLE edema.  Pulmonary/Chest: Normal effort and positive vesicular breath sounds. No respiratory distress. No wheezes, rales or ronchi noted.  Musculoskeletal:  No difficulty with gait.  Neurological: Alert and oriented.  Coordination normal.    BMET    Component Value Date/Time   NA 141 12/12/2022 0437   NA 145 (H) 02/05/2021 0909   K 3.8 12/12/2022 0437   CL 106 12/12/2022 0437   CO2 26 12/12/2022 0437   GLUCOSE 88 12/12/2022 0437   BUN 24 (H) 12/12/2022 0437   BUN 16 02/05/2021 0909   CREATININE 1.38 (H) 12/12/2022  0437   CREATININE 1.32 11/08/2022 0948   CALCIUM 9.1 12/12/2022 0437   GFRNONAA  58 (L) 12/12/2022 0437   GFRAA >60 10/19/2019 1337   GFRAA >60 10/19/2019 1337    Lipid Panel     Component Value Date/Time   CHOL 157 11/08/2022 0948   CHOL 143 02/05/2021 0909   TRIG 71 11/08/2022 0948   HDL 47 11/08/2022 0948   HDL 48 02/05/2021 0909   CHOLHDL 3.3 11/08/2022 0948   VLDL 22.4 01/17/2020 1214   LDLCALC 94 11/08/2022 0948    CBC    Component Value Date/Time   WBC 7.1 12/12/2022 0437   RBC 5.00 12/12/2022 0437   HGB 15.8 12/12/2022 0437   HGB 16.2 02/05/2021 0909   HCT 45.2 12/12/2022 0437   HCT 45.5 02/05/2021 0909   PLT 159 12/12/2022 0437   PLT 195 02/05/2021 0909   MCV 90.4 12/12/2022 0437   MCV 88 02/05/2021 0909   MCH 31.6 12/12/2022 0437   MCHC 35.0 12/12/2022 0437   RDW 12.9 12/12/2022 0437   RDW 12.6 02/05/2021 0909   LYMPHSABS 2.1 12/12/2022 0437   LYMPHSABS 1.6 10/31/2017 1438   MONOABS 0.7 12/12/2022 0437   EOSABS 0.3 12/12/2022 0437   EOSABS 0.2 10/31/2017 1438   BASOSABS 0.1 12/12/2022 0437   BASOSABS 0.0 10/31/2017 1438    Hgb A1C Lab Results  Component Value Date   HGBA1C 5.1 11/08/2022           Assessment & Plan:   Assessment and Plan    Orthostatic Hypotension Dizziness with position changes and low blood pressure. Olmesartan  and metoprolol  may contribute to hypotension. Decision to hold olmesartan  and reassess metoprolol  if hypotension persists. - orthostatics positive - Hold olmesartan  immediately. - Continue metoprolol  for now, reassess by Wednesday if hypotension persists. - Advise regular home blood pressure monitoring. - Encourage increased fluid intake.   Headaches, dizziness Headaches occur two to three times a week, decreased severity. Under neurologist care with no significant improvement. - Continue topiramate  as previously prescribed -He has a followup appt with neurology next week     Schedule an appointment for your annual exam Angeline Laura, NP

## 2023-08-11 NOTE — Telephone Encounter (Signed)
 FYI Only or Action Required?: Action required by provider: request for appointment.  Patient was last seen in primary care on 06/30/2023 by Antonette Angeline ORN, NP.  Called Nurse Triage reporting Dizziness.  Symptoms began several months ago.  Interventions attempted: Rest, hydration, or home remedies.  Symptoms are: unchanged.  Triage Disposition: See Physician Within 24 Hours  Patient/caregiver understands and will follow disposition?: YesCopied from CRM [id number]. Topic: Clinical - Red Word Triage >> Aug 11, 2023  8:56 AM Charlet HERO wrote: Red Word that prompted transfer to Nurse Triage: Patient is calling bc he is dizzy when he sits down and gets back up, he is seeing a neuroligist Answer Assessment - Initial Assessment Questions 1. DESCRIPTION: Describe your dizziness.     Loses balance 2. LIGHTHEADED: Do you feel lightheaded? (e.g., somewhat faint, woozy, weak upon standing)     Denies it currently  3. VERTIGO: Do you feel like either you or the room is spinning or tilting? (i.e., vertigo)     denies 4. SEVERITY: How bad is it?  Do you feel like you are going to faint? Can you stand and walk?     denies 5. ONSET:  When did the dizziness begin?     'long time 6. AGGRAVATING FACTORS: Does anything make it worse? (e.g., standing, change in head position)     Bending over; gets too hot 7. HEART RATE: Can you tell me your heart rate? How many beats in 15 seconds?  (Note: Not all patients can do this.)       Na  8. CAUSE: What do you think is causing the dizziness? (e.g., decreased fluids or food, diarrhea, emotional distress, heat exposure, new medicine, sudden standing, vomiting; unknown)     Not sure 9. RECURRENT SYMPTOM: Have you had dizziness before? If Yes, ask: When was the last time? What happened that time?     yes 10. OTHER SYMPTOMS: Do you have any other symptoms? (e.g., fever, chest pain, vomiting, diarrhea, bleeding)        denies      118/65; Bending down and going upright gets dizzy. Gets too hot-gets dizzy  Protocols used: Dizziness - Lightheadedness-A-AH  Reason for Disposition  [1] MODERATE dizziness (e.g., interferes with normal activities) AND [2] has NOT been evaluated by doctor (or NP/PA) for this  (Exception: Dizziness caused by heat exposure, sudden standing, or poor fluid intake.)  Answer Assessment - Initial Assessment Questions 1. DESCRIPTION: Describe your dizziness.     Loses balance 2. LIGHTHEADED: Do you feel lightheaded? (e.g., somewhat faint, woozy, weak upon standing)     Denies it currently  3. VERTIGO: Do you feel like either you or the room is spinning or tilting? (i.e., vertigo)     denies 4. SEVERITY: How bad is it?  Do you feel like you are going to faint? Can you stand and walk?     denies 5. ONSET:  When did the dizziness begin?     'long time 6. AGGRAVATING FACTORS: Does anything make it worse? (e.g., standing, change in head position)     Bending over; gets too hot 7. HEART RATE: Can you tell me your heart rate? How many beats in 15 seconds?  (Note: Not all patients can do this.)       Na  8. CAUSE: What do you think is causing the dizziness? (e.g., decreased fluids or food, diarrhea, emotional distress, heat exposure, new medicine, sudden standing, vomiting; unknown)     Not sure 9.  RECURRENT SYMPTOM: Have you had dizziness before? If Yes, ask: When was the last time? What happened that time?     yes 10. OTHER SYMPTOMS: Do you have any other symptoms? (e.g., fever, chest pain, vomiting, diarrhea, bleeding)       denies      118/65; Bending down and going upright gets dizzy. Gets too hot-gets dizzy  Protocols used: Dizziness - Lightheadedness-A-AH  Reason for Disposition  [1] MODERATE dizziness (e.g., interferes with normal activities) AND [2] has NOT been evaluated by doctor (or NP/PA) for this  (Exception: Dizziness caused by heat  exposure, sudden standing, or poor fluid intake.)  Answer Assessment - Initial Assessment Questions 1. DESCRIPTION: Describe your dizziness.     Loses balance 2. LIGHTHEADED: Do you feel lightheaded? (e.g., somewhat faint, woozy, weak upon standing)     Denies it currently  3. VERTIGO: Do you feel like either you or the room is spinning or tilting? (i.e., vertigo)     denies 4. SEVERITY: How bad is it?  Do you feel like you are going to faint? Can you stand and walk?     denies 5. ONSET:  When did the dizziness begin?     'long time 6. AGGRAVATING FACTORS: Does anything make it worse? (e.g., standing, change in head position)     Bending over; gets too hot 7. HEART RATE: Can you tell me your heart rate? How many beats in 15 seconds?  (Note: Not all patients can do this.)       Na  8. CAUSE: What do you think is causing the dizziness? (e.g., decreased fluids or food, diarrhea, emotional distress, heat exposure, new medicine, sudden standing, vomiting; unknown)     Not sure 9. RECURRENT SYMPTOM: Have you had dizziness before? If Yes, ask: When was the last time? What happened that time?     yes 10. OTHER SYMPTOMS: Do you have any other symptoms? (e.g., fever, chest pain, vomiting, diarrhea, bleeding)       denies      Pt is being treated by referred neurologist but isn't happy with care and wants PCP to reevaluate. Pt notices the triggers are bending over and overheating. BP this morning was 118/65. Pt stated he is drinking gallons of water. Pt denies all other symptoms.  Protocols used: Dizziness - Lightheadedness-A-AH

## 2023-08-11 NOTE — Assessment & Plan Note (Signed)
 Encouraged diet and exercise for weight loss ?

## 2023-08-13 NOTE — Telephone Encounter (Signed)
 Requested Prescriptions  Pending Prescriptions Disp Refills   pantoprazole  (PROTONIX ) 40 MG tablet [Pharmacy Med Name: PANTOPRAZOLE  SODIUM 40MG  TABLET DR] 90 tablet 0    Sig: TAKE ONE TABLET BY MOUTH ONCE A DAY     Gastroenterology: Proton Pump Inhibitors Passed - 08/13/2023 12:07 PM      Passed - Valid encounter within last 12 months    Recent Outpatient Visits           2 days ago Orthostatic hypotension   [place] [facility] [place], Angeline ORN, NP   1 month ago Cellulitis of right upper extremity   [place] [facility], Angeline ORN, NP   3 months ago Primary hypertension   [place] [facility] [place], Angeline ORN, [place]

## 2023-08-14 ENCOUNTER — Ambulatory Visit (INDEPENDENT_AMBULATORY_CARE_PROVIDER_SITE_OTHER): Payer: Medicare Other

## 2023-08-14 VITALS — Ht 69.0 in | Wt 246.0 lb

## 2023-08-14 DIAGNOSIS — Z1211 Encounter for screening for malignant neoplasm of colon: Secondary | ICD-10-CM

## 2023-08-14 DIAGNOSIS — Z0000 Encounter for general adult medical examination without abnormal findings: Secondary | ICD-10-CM | POA: Diagnosis not present

## 2023-08-14 NOTE — Progress Notes (Signed)
 Subjective:   Richard [name] is a 63 y.o. who presents for a Medicare Wellness preventive visit.  As a reminder, Annual Wellness Visits don't include a physical exam, and some assessments may be limited, especially if this visit is performed virtually. We may recommend an in-person follow-up visit with your provider if needed.  Visit Complete: Virtual I connected with  Richard [name] on 08/14/23 by a video and audio enabled telemedicine application and verified that I am speaking with the correct person using two identifiers.  Patient Location: Home  Provider Location: Home Office  I discussed the limitations of evaluation and management by telemedicine. The patient expressed understanding and agreed to proceed.  Vital Signs: Because this visit was a virtual/telehealth visit, some criteria may be missing or patient reported. Any vitals not documented were not able to be obtained and vitals that have been documented are patient reported.    Persons Participating in Visit: Patient.  AWV Questionnaire: No: Patient Medicare AWV questionnaire was not completed prior to this visit.  Cardiac Risk Factors include: advanced age (>42men, >14 women);male gender;hypertension      Objective:    Today's Vitals   08/14/23 1314  Weight: 246 lb (111.6 kg)  Height: 5' 9 (1.753 m)   Body mass index is 36.33 kg/m.     08/14/2023    1:20 PM 04/30/2023    6:18 PM 12/10/2022   10:35 AM 08/08/2022   12:40 PM 12/30/2019   12:09 PM 10/26/2019   12:00 PM 10/26/2019    5:55 AM  Advanced Directives  Does Patient Have a Medical Advance Directive? No No No No No No No  Would patient like information on creating a medical advance directive? No - Patient declined  No - Patient declined  No - Patient declined No - Patient declined No - Patient declined    Current Medications (verified) Outpatient Encounter Medications as of 08/14/2023  Medication Sig   allopurinol  (ZYLOPRIM ) 100 MG tablet TAKE ONE  TABLET (100 MG TOTAL) BY MOUTH DAILY.   baclofen  (LIORESAL ) 10 MG tablet TAKE ONE TABLET (10 MG TOTAL) BY MOUTH TWO TIMES DAILY.   colchicine  0.6 MG tablet TAKE ONE TABLET BY MOUTH TWICE A DAY   ELIQUIS  5 MG TABS tablet TAKE ONE TABLET BY MOUTH TWO TIMES DAILY   flecainide  (TAMBOCOR ) 100 MG tablet TAKE ONE TABLET BY MOUTH TWICE DAILY   furosemide  (LASIX ) 20 MG tablet TAKE ONE TABLET (20 MG TOTAL) BY MOUTH DAILY AS NEEDED.   metoprolol  succinate (TOPROL -XL) 25 MG 24 hr tablet Take 25 mg by mouth.   Multiple Vitamins-Minerals (MULTIVITAMIN WITH MINERALS) tablet Take 1 tablet by mouth daily.   olmesartan  (BENICAR ) 20 MG tablet Take 0.5 tablets (10 mg total) by mouth daily.   pantoprazole  (PROTONIX ) 40 MG tablet TAKE ONE TABLET BY MOUTH ONCE A DAY   Sennosides-Docusate Sodium  (SM STOOL SOFTENER/LAXATIVE PO) Take 8.6 mg by mouth daily.   topiramate  (TOPAMAX ) 25 MG tablet TAKE ONE TABLET (25 MG TOTAL) BY MOUTH AT BEDTIME.   traMADol  (ULTRAM ) 50 MG tablet Take 1 tablet (50 mg total) by mouth daily as needed.   venlafaxine (EFFEXOR) 37.5 MG tablet Take 37.5 mg by mouth daily.   No facility-administered encounter medications on file as of 08/14/2023.    Allergies (verified) Penicillins, Vancomycin , Penicillins, and Vancomycin    History: Past Medical History:  Diagnosis Date   Arthritis    Arthrofibrosis of total knee arthroplasty (HCC) 05/30/2015   Bilateral pulmonary embolism (HCC)  a. 03/2015 CTA: acute bilat PE; b. IVC filter placed in 2017--> removed 2018.   Chest pain    a. 2015 Abnl MV;  b. 2015 Cath: nl cors.   Chronic diastolic CHF (congestive heart failure) (HCC)    a. 03/2015 Echo: EF 55-65%, poor windows, mildly dil RV.   DVT (deep venous thrombosis) (HCC)    a. 03/2015 U/S: occlusive DVT w/in the distal aspect of the Left fem vein through the L popliteal vein.   Dyspnea    On exertion   GERD (gastroesophageal reflux disease)    Headache    Hypertension    Hypertensive heart  disease    Obesity    OSA (obstructive sleep apnea)    uses CPAP nightly settings at 15    PAF (paroxysmal atrial fibrillation) (HCC)    Pneumonia    hx of years ago    Primary localized osteoarthritis of left knee    a. 11/2014 s/p L TKA.   Primary localized osteoarthritis of right knee    a. 02/2014 s/p R Partial Knee arthroplasty.   Stiffness of left knee    a. 12/2014 s/p manipulation under anesthesia.   Urine incontinence    Past Surgical History:  Procedure Laterality Date   APPENDECTOMY     CARDIAC CATHETERIZATION     CARPAL TUNNEL RELEASE Left 03/2018   CYSTOSCOPY W/ RETROGRADES  12/09/2019   Procedure: CYSTOSCOPY WITH RETROGRADE PYELOGRAM;  Surgeon: Kassie Richard SAUNDERS, MD;  Location: [facility];  Service: Urology;;   CYSTOSCOPY WITH STENT PLACEMENT Right 12/09/2019   Procedure: CYSTOSCOPY WITH STENT PLACEMENT;  Surgeon: Kassie Richard SAUNDERS, MD;  Location: [facility];  Service: Urology;  Laterality: Right;   EXAM UNDER ANESTHESIA WITH MANIPULATION OF KNEE Left 05/30/2015   Procedure: EXAM UNDER ANESTHESIA WITH MANIPULATION OF LEFT KNEE;  Surgeon: Fonda Olmsted, MD;  Location: [facility];  Service: Orthopedics;  Laterality: Left;   HIATAL HERNIA REPAIR N/A 10/26/2019   Procedure: HIATAL HERNIA REPAIR;  Surgeon: Gladis Cough, MD;  Location: [facility];  Service: General;  Laterality: N/A;   I & D KNEE WITH POLY EXCHANGE Left 10/30/2015   Procedure: IRRIGATION AND DEBRIDEMENT KNEE WITH POLY EXCHANGE PLACE SPACERS;  Surgeon: Dempsey Sensor, MD;  Location: [facility];  Service: Orthopedics;  Laterality: Left;  OFF ELIQUIST 3 DAYS   IVC FILTER PLACEMENT ([facility] HX)  04/13/15   IVC FILTER REMOVAL N/A 08/13/2016   Procedure: IVC Filter Removal;  Surgeon: Jama Cordella MATSU, MD;  Location: [facility];  Service: Cardiovascular;  Laterality: N/A;   JOINT REPLACEMENT     KNEE ARTHROSCOPY Left 08/08/2015   Procedure: LEFT KNEE MANIPULATION WITH ARTHROSCOPIC LYSIS OF ADHESIONS AND ASPIRATION;  Surgeon: Fonda Olmsted, MD;  Location: [facility];  Service: Orthopedics;  Laterality: Left;   KNEE CLOSED REDUCTION Left 01/20/2015   Procedure: LEFT KNEE MANIPULATION;  Surgeon: Fonda Olmsted, MD;  Location: [facility];  Service: Orthopedics;  Laterality: Left;   LAPAROSCOPIC GASTRIC SLEEVE RESECTION N/A 10/26/2019   Procedure: LAPAROSCOPIC SLEEVE GASTRECTOMY;  Surgeon: Gladis Cough, MD;  Location: [facility];  Service: General;  Laterality: N/A;   LEFT HEART CATHETERIZATION WITH CORONARY ANGIOGRAM N/A 01/12/2014   Procedure: LEFT HEART CATHETERIZATION WITH CORONARY ANGIOGRAM;  Surgeon: Candyce GORMAN Reek, MD;  Location: [facility];  Service: Cardiovascular;  Laterality: N/A;   ORTHOPEDIC SURGERY Left    arthroscopy x3   PARTIAL KNEE ARTHROPLASTY Right 02/25/2014   Procedure: RIGHT KNEE ARTHROPLASTY CONDYLE AND PLATEAU  MEDIAL COMPARTMENT ;  Surgeon: Fonda SHAUNNA Olmsted, MD;  Location: [facility];  Service: Orthopedics;  Laterality: Right;   PERIPHERAL VASCULAR CATHETERIZATION N/A 03/29/2015   Procedure: IVC Filter Insertion, and possible thrombectomy;  Surgeon: Cordella KANDICE Shawl, MD;  Location: [facility];  Service: Cardiovascular;  Laterality: N/A;   ROTATOR CUFF REPAIR Left    TOTAL KNEE ARTHROPLASTY  12/06/2014   Procedure: TOTAL KNEE ARTHROPLASTY;  Surgeon: Fonda Olmsted, MD;  Location[facility];  Service: Orthopedics;;   TOTAL KNEE REVISION Left 02/08/2016   Procedure: TOTAL KNEE REVISION;  Surgeon: Dempsey Sensor, MD;  Location: [facility];  Service: Orthopedics;  Laterality: Left;   UPPER GI ENDOSCOPY N/A 10/26/2019   Procedure: UPPER GI ENDOSCOPY;  Surgeon: Gladis Cough, MD;  Location: [facility];  Service: General;  Laterality: N/A;   URETEROSCOPY WITH HOLMIUM LASER LITHOTRIPSY Right 12/30/2019   Procedure: URETEROSCOPY WITH HOLMIUM LASER LITHOTRIPSY;  Surgeon: Kassie Richard SAUNDERS, MD;  Location: [facility];  Service: Urology;  Laterality: Right;   Family History  Problem Relation Age of  Onset   Coronary artery disease Mother    Hyperlipidemia Mother    Hypertension Mother    Arthritis Mother    Coronary artery disease Father    Heart disease Paternal Grandmother    Alzheimer's disease Paternal Grandfather    Social History   Socioeconomic History   Marital status: Married    Spouse name: Boaz Berisha   Number of children: 2   Years of education: 12   Highest education level: Not on file  Occupational History   Occupation: Acupuncturist: Engineer, civil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) [employer]  Tobacco Use   Smoking status: Never   Smokeless tobacco: Never  Vaping Use   Vaping status: Never Used  Substance and Sexual Activity   Alcohol use: Not Currently    Comment: \   Drug use: No   Sexual activity: Yes  Other Topics Concern   Not on file  Social History Narrative   ** Merged History Encounter **       Social Drivers of Health   Financial Resource Strain: Low Risk  (08/14/2023)   Overall Financial Resource Strain (CARDIA)    Difficulty of Paying Living Expenses: Not hard at all  Food Insecurity: No Food Insecurity (08/14/2023)   Hunger Vital Sign    Worried About Running Out of Food in the Last Year: Never true    Ran Out of Food in the Last Year: Never true  Transportation Needs: No Transportation Needs (08/14/2023)   PRAPARE - Administrator, Civil Service (Medical): No    Lack of Transportation (Non-Medical): No  Physical Activity: Sufficiently Active (08/14/2023)   Exercise Vital Sign    Days of Exercise per Week: 6 days    Minutes of Exercise per Session: 30 min  Stress: No Stress Concern Present (08/14/2023)   [employer] of Occupational Health - Occupational Stress Questionnaire    Feeling of Stress: Not at all  Social Connections: Socially Integrated (08/14/2023)   Social Connection and Isolation Panel    Frequency of Communication with Friends and Family: More than three times a week    Frequency of Social Gatherings with Friends and Family: More than three  times a week    Attends Religious Services: More than 4 times per year    Active Member of [employer] or Organizations: Yes    Attends Banker Meetings: More than 4 times per year  Marital Status: Married    Tobacco Counseling Counseling given: Not Answered    Clinical Intake:  Pre-visit preparation completed: Yes  Pain : No/denies pain     BMI - recorded: 36.33 Nutritional Status: BMI > 30  Obese Nutritional Risks: None Diabetes: No  Lab Results  Component Value Date   HGBA1C 5.1 11/08/2022   HGBA1C 5.2 05/09/2022   HGBA1C 4.9 01/17/2020     How often do you need to have someone help you when you read instructions, pamphlets, or other written materials from your doctor or pharmacy?: 1 - Never  Interpreter Needed?: No  Information entered by :: Rojelio Blush LPN   Activities of Daily Living     08/14/2023    1:19 PM 12/10/2022    8:00 PM  In your present state of health, do you have any difficulty performing the following activities:  Hearing? 0 1  Vision? 0 0  Difficulty concentrating or making decisions? 0 0  Walking or climbing stairs? 0   Dressing or bathing? 0   Doing errands, shopping? 0 0  Preparing Food and eating ? N   Using the Toilet? N   In the past six months, have you accidently leaked urine? N   Do you have problems with loss of bowel control? N   Managing your Medications? N   Managing your Finances? N   Housekeeping or managing your Housekeeping? N     Patient Care Team: Antonette Angeline ORN, NP as PCP - General (Internal Medicine) Darron Deatrice LABOR, MD as PCP - Cardiology (Cardiology) Antonette Angeline ORN, NP (Internal Medicine)  I have updated your Care Teams any recent Medical Services you may have received from other providers in the past year.     Assessment:   This is a routine wellness examination for Richard [name].  Hearing/Vision screen Hearing Screening - Comments:: Denies hearing difficulties   Vision Screening -  Comments:: Wears rx glasses - up to date with routine eye exams with  [facility]   Goals Addressed               This Visit's Progress     Increase physical activity (pt-stated)        Remain active.       Depression Screen     08/14/2023    1:18 PM 08/11/2023    3:47 PM 06/30/2023    1:30 PM 05/01/2023    1:32 PM 12/23/2022    1:54 PM 11/08/2022    9:20 AM 08/08/2022   12:42 PM  PHQ 2/9 Scores  PHQ - 2 Score 0 0 2 0 0 0 0  PHQ- 9 Score 0 4 2 3    0    Fall Risk     08/14/2023    1:19 PM 08/11/2023    3:47 PM 06/30/2023    1:30 PM 05/01/2023    1:32 PM 12/23/2022    1:54 PM  Fall Risk   Falls in the past year? 1 0 0 0 0  Number falls in past yr: 0      Injury with Fall? 0  0 0 0  Risk for fall due to : No Fall Risks      Follow up Falls evaluation completed        MEDICARE RISK AT HOME:  Medicare Risk at Home Any stairs in or around the home?: No If so, are there any without handrails?: No Home free of loose throw rugs in walkways, pet beds, electrical  cords, etc?: Yes Adequate lighting in your home to reduce risk of falls?: Yes Life alert?: No Use of a cane, walker or w/c?: No Grab bars in the bathroom?: Yes Shower chair or bench in shower?: Yes Elevated toilet seat or a handicapped toilet?: Yes  TIMED UP AND GO:  Was the test performed?  No  Cognitive Function: 6CIT completed        08/14/2023    1:20 PM 08/08/2022   12:43 PM 05/07/2021    1:37 PM  6CIT Screen  What Year? 0 points 0 points 0 points  What month? 0 points 0 points 0 points  What time? 0 points 0 points 0 points  Count back from 20 0 points 0 points 0 points  Months in reverse 0 points 4 points 0 points  Repeat phrase 0 points 2 points 2 points  Total Score 0 points 6 points 2 points    Immunizations Immunization History  Administered Date(s) Administered   Influenza, Seasonal, Injecte, Preservative Fre 11/08/2022   Influenza,inj,Quad PF,6+ Mos 10/01/2018, 01/17/2020,  11/09/2021   Moderna Sars-Covid-2 Vaccination 05/12/2019, 06/09/2019   Td 02/21/2009   Tdap 05/07/2021    Screening Tests Health Maintenance  Topic Date Due   Pneumococcal Vaccine 84-64 Years old (1 of 2 - PCV) Never done   Zoster Vaccines- Shingrix (1 of 2) Never done   Fecal DNA (Cologuard)  02/01/2023   INFLUENZA VACCINE  08/22/2023   Medicare Annual Wellness (AWV)  08/13/2024   DTaP/Tdap/Td (3 - Td or Tdap) 05/08/2031   Hepatitis C Screening  Completed   HIV Screening  Completed   Hepatitis B Vaccines  Aged Out   HPV VACCINES  Aged Out   Meningococcal B Vaccine  Aged Out   COVID-19 Vaccine  Discontinued    Health Maintenance  Health Maintenance Due  Topic Date Due   Pneumococcal Vaccine 47-73 Years old (1 of 2 - PCV) Never done   Zoster Vaccines- Shingrix (1 of 2) Never done   Fecal DNA (Cologuard)  02/01/2023   Health Maintenance Items Addressed: Cologuard Ordered  Additional Screening:  Vision Screening: Recommended annual ophthalmology exams for early detection of glaucoma and other disorders of the eye. Would you like a referral to an eye doctor? No    Dental Screening: Recommended annual dental exams for proper oral hygiene  Community Resource Referral / Chronic Care Management: CRR required this visit?  No   CCM required this visit?  No   Plan:    I have personally reviewed and noted the following in the patient's chart:   Medical and social history Use of alcohol, tobacco or illicit drugs  Current medications and supplements including opioid prescriptions. Patient is not currently taking opioid prescriptions. Functional ability and status Nutritional status Physical activity Advanced directives List of other physicians Hospitalizations, surgeries, and ER visits in previous 12 months Vitals Screenings to include cognitive, depression, and falls Referrals and appointments  In addition, I have reviewed and discussed with patient certain  preventive protocols, quality metrics, and best practice recommendations. A written personalized care plan for preventive services as well as general preventive health recommendations were provided to patient.   Rojelio LELON Blush, LPN   [id number]/[id number]   After Visit Summary: (MyChart) Due to this being a telephonic visit, the after visit summary with patients personalized plan was offered to patient via MyChart   Notes: Nothing significant to report at this time.

## 2023-08-14 NOTE — Patient Instructions (Signed)
 Richard [name] , Thank you for taking time out of your busy schedule to complete your Annual Wellness Visit with me. I enjoyed our conversation and look forward to speaking with you again next year. I, as well as your care team,  appreciate your ongoing commitment to your health goals. Please review the following plan we discussed and let me know if I can assist you in the future. Your Game plan/ To Do List    Referrals: If you haven't heard from the office you've been referred to, please reach out to them at the phone provided.   Follow up Visits: Next Medicare AWV with our clinical staff: 08/20/24 @ 1:20p   Have you seen your provider in the last 6 months (3 months if uncontrolled diabetes)?  Next Office Visit with your provider: 728/25 @ 1p  Clinician Recommendations:  Aim for 30 minutes of exercise or brisk walking, 6-8 glasses of water, and 5 servings of fruits and vegetables each day.       This is a list of the screening recommended for you and due dates:  Health Maintenance  Topic Date Due   Pneumococcal Vaccination (1 of 2 - PCV) Never done   Zoster (Shingles) Vaccine (1 of 2) Never done   Cologuard (Stool DNA test)  02/01/2023   Flu Shot  08/22/2023   Medicare Annual Wellness Visit  08/13/2024   DTaP/Tdap/Td vaccine (3 - Td or Tdap) 05/08/2031   Hepatitis C Screening  Completed   HIV Screening  Completed   Hepatitis B Vaccine  Aged Out   HPV Vaccine  Aged Out   Meningitis B Vaccine  Aged Out   COVID-19 Vaccine  Discontinued    Advanced directives: (Declined) Advance directive discussed with you today. Even though you declined this today, please call our office should you change your mind, and we can give you the proper paperwork for you to fill out. Advance Care Planning is important because it:  [x]  Makes sure you receive the medical care that is consistent with your values, goals, and preferences  [x]  It provides guidance to your family and loved ones and reduces their  decisional burden about whether or not they are making the right decisions based on your wishes.  Follow the link provided in your after visit summary or read over the paperwork we have mailed to you to help you started getting your Advance Directives in place. If you need assistance in completing these, please reach out to us  so that we can help you!  See attachments for Preventive Care and Fall Prevention Tips.

## 2023-08-16 ENCOUNTER — Other Ambulatory Visit: Payer: Self-pay | Admitting: Internal Medicine

## 2023-08-18 ENCOUNTER — Encounter: Payer: Self-pay | Admitting: Internal Medicine

## 2023-08-18 ENCOUNTER — Ambulatory Visit (INDEPENDENT_AMBULATORY_CARE_PROVIDER_SITE_OTHER): Admitting: Internal Medicine

## 2023-08-18 VITALS — BP 118/62 | Ht 69.0 in | Wt 249.4 lb

## 2023-08-18 DIAGNOSIS — M1A0790 Idiopathic chronic gout, unspecified ankle and foot, without tophus (tophi): Secondary | ICD-10-CM | POA: Diagnosis not present

## 2023-08-18 DIAGNOSIS — Z0001 Encounter for general adult medical examination with abnormal findings: Secondary | ICD-10-CM

## 2023-08-18 DIAGNOSIS — E66812 Obesity, class 2: Secondary | ICD-10-CM

## 2023-08-18 DIAGNOSIS — E538 Deficiency of other specified B group vitamins: Secondary | ICD-10-CM

## 2023-08-18 DIAGNOSIS — Z23 Encounter for immunization: Secondary | ICD-10-CM | POA: Diagnosis not present

## 2023-08-18 DIAGNOSIS — G459 Transient cerebral ischemic attack, unspecified: Secondary | ICD-10-CM

## 2023-08-18 DIAGNOSIS — R739 Hyperglycemia, unspecified: Secondary | ICD-10-CM | POA: Diagnosis not present

## 2023-08-18 DIAGNOSIS — Z6836 Body mass index (BMI) 36.0-36.9, adult: Secondary | ICD-10-CM

## 2023-08-18 MED ORDER — VENLAFAXINE HCL 37.5 MG PO TABS: 37.5000 mg | ORAL_TABLET | Freq: Every day | ORAL | 1 refills | Status: DC

## 2023-08-18 NOTE — Progress Notes (Signed)
 Subjective:    Patient ID: Richard [name], male    DOB: January 10, 1961, 63 y.o.   MRN: [id number]  HPI  Patient presents to clinic today for his annual exam.  Flu: 10/2022 Tetanus: 04/2021 COVID: Moderna x 2 Shingrix: x 2 [facility] Prevnar 20: never PSA screening: 04/2022 Colon screening: 01/2020, Cologuard Vision screening: annually Dentist: biannually  Diet: He does eat meat. He consumes fruits and veggies. He rarely eats fried foods. He drinks iced tea and water. Exercise: Walking  Review of Systems     Past Medical History:  Diagnosis Date   Arthritis    Arthrofibrosis of total knee arthroplasty (HCC) 05/30/2015   Bilateral pulmonary embolism (HCC)    a. 03/2015 CTA: acute bilat PE; b. IVC filter placed in 2017--> removed 2018.   Chest pain    a. 2015 Abnl MV;  b. 2015 Cath: nl cors.   Chronic diastolic CHF (congestive heart failure) (HCC)    a. 03/2015 Echo: EF 55-65%, poor windows, mildly dil RV.   DVT (deep venous thrombosis) (HCC)    a. 03/2015 U/S: occlusive DVT w/in the distal aspect of the Left fem vein through the L popliteal vein.   Dyspnea    On exertion   GERD (gastroesophageal reflux disease)    Headache    Hypertension    Hypertensive heart disease    Obesity    OSA (obstructive sleep apnea)    uses CPAP nightly settings at 15    PAF (paroxysmal atrial fibrillation) (HCC)    Pneumonia    hx of years ago    Primary localized osteoarthritis of left knee    a. 11/2014 s/p L TKA.   Primary localized osteoarthritis of right knee    a. 02/2014 s/p R Partial Knee arthroplasty.   Stiffness of left knee    a. 12/2014 s/p manipulation under anesthesia.   Urine incontinence     Current Outpatient Medications  Medication Sig Dispense Refill   allopurinol  (ZYLOPRIM ) 100 MG tablet TAKE ONE TABLET (100 MG TOTAL) BY MOUTH DAILY. 90 tablet 1   baclofen  (LIORESAL ) 10 MG tablet TAKE ONE TABLET (10 MG TOTAL) BY MOUTH TWO TIMES DAILY. 180 tablet 1    colchicine  0.6 MG tablet TAKE ONE TABLET BY MOUTH TWICE A DAY 180 tablet 1   ELIQUIS  5 MG TABS tablet TAKE ONE TABLET BY MOUTH TWO TIMES DAILY 180 tablet 1   flecainide  (TAMBOCOR ) 100 MG tablet TAKE ONE TABLET BY MOUTH TWICE DAILY 180 tablet 2   furosemide  (LASIX ) 20 MG tablet TAKE ONE TABLET (20 MG TOTAL) BY MOUTH DAILY AS NEEDED. 90 tablet 0   metoprolol  succinate (TOPROL -XL) 25 MG 24 hr tablet Take 25 mg by mouth.     Multiple Vitamins-Minerals (MULTIVITAMIN WITH MINERALS) tablet Take 1 tablet by mouth daily.     olmesartan  (BENICAR ) 20 MG tablet Take 0.5 tablets (10 mg total) by mouth daily. 45 tablet 3   pantoprazole  (PROTONIX ) 40 MG tablet TAKE ONE TABLET BY MOUTH ONCE A DAY 90 tablet 0   Sennosides-Docusate Sodium  (SM STOOL SOFTENER/LAXATIVE PO) Take 8.6 mg by mouth daily.     topiramate  (TOPAMAX ) 25 MG tablet TAKE ONE TABLET (25 MG TOTAL) BY MOUTH AT BEDTIME. 90 tablet 0   traMADol  (ULTRAM ) 50 MG tablet Take 1 tablet (50 mg total) by mouth daily as needed. 30 tablet 0   venlafaxine  (EFFEXOR ) 37.5 MG tablet Take 37.5 mg by mouth daily.     No current facility-administered medications  for this visit.    Allergies  Allergen Reactions   Penicillins Anaphylaxis and Other (See Comments)    From childhood; uncertain of reaction Has patient had a PCN reaction causing immediate rash, facial/tongue/throat swelling, SOB or lightheadedness with hypotension: Unk Has patient had a PCN reaction causing severe rash involving mucus membranes or skin necrosis: Unk Has patient had a PCN reaction that required hospitalization: Unk Has patient had a PCN reaction occurring within the last 10 years: No If all of the above answers are NO, then may proceed with Cephalosporin use.   Vancomycin  Other (See Comments)    Red Mans' syndrome   Penicillins Other (See Comments)    Childhood   Vancomycin  Other (See Comments)    Red man Syndrome    Family History  Problem Relation Age of Onset   Coronary  artery disease Mother    Hyperlipidemia Mother    Hypertension Mother    Arthritis Mother    Coronary artery disease Father    Heart disease Paternal Grandmother    Alzheimer's disease Paternal Grandfather     Social History   Socioeconomic History   Marital status: Married    Spouse name: Valerio Pinard   Number of children: 2   Years of education: 12   Highest education level: GED or equivalent  Occupational History   Occupation: Acupuncturist: Engineer, civil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) [employer]  Tobacco Use   Smoking status: Never   Smokeless tobacco: Never  Vaping Use   Vaping status: Never Used  Substance and Sexual Activity   Alcohol use: Not Currently    Comment: \   Drug use: No   Sexual activity: Yes  Other Topics Concern   Not on file  Social History Narrative   ** Merged History Encounter **       Social Drivers of Health   Financial Resource Strain: Low Risk  (08/17/2023)   Overall Financial Resource Strain (CARDIA)    Difficulty of Paying Living Expenses: Not hard at all  Food Insecurity: No Food Insecurity (08/17/2023)   Hunger Vital Sign    Worried About Running Out of Food in the Last Year: Never true    Ran Out of Food in the Last Year: Never true  Transportation Needs: Unknown (08/17/2023)   PRAPARE - Administrator, Civil Service (Medical): No    Lack of Transportation (Non-Medical): Not on file  Physical Activity: Insufficiently Active (08/17/2023)   Exercise Vital Sign    Days of Exercise per Week: 5 days    Minutes of Exercise per Session: 10 min  Stress: No Stress Concern Present (08/17/2023)   [employer] of Occupational Health - Occupational Stress Questionnaire    Feeling of Stress: Not at all  Social Connections: Unknown (08/17/2023)   Social Connection and Isolation Panel    Frequency of Communication with Friends and Family: Three times a week    Frequency of Social Gatherings with Friends and Family: Three times a week    Attends Religious Services: 1 to  4 times per year    Active Member of Clubs or Organizations: Not on file    Attends Banker Meetings: Not on file    Marital Status: Married  Intimate Partner Violence: Not At Risk (08/14/2023)   Humiliation, Afraid, Rape, and Kick questionnaire    Fear of Current or Ex-Partner: No    Emotionally Abused: No    Physically Abused: No    Sexually Abused: No  Constitutional: Pt reports intermittent headaches. Denies fever, malaise, fatigue, or abrupt weight changes.  HEENT: Patient reports vision changes.  Denies eye pain, eye redness, ear pain, ringing in the ears, wax buildup, runny nose, nasal congestion, bloody nose, or sore throat. Respiratory: Denies difficulty breathing, shortness of breath, cough or sputum production.   Cardiovascular: Denies chest pain, chest tightness, palpitations or swelling in the hands or feet.  Gastrointestinal: Pt reports intermittent reflux. Denies abdominal pain, bloating, constipation, diarrhea or blood in the stool.  GU: Denies urgency, frequency, pain with urination, burning sensation, blood in urine, odor or discharge. Musculoskeletal: Patient reports joint pain.  Denies decrease in range of motion, difficulty with gait, muscle pain or joint swelling.  Skin: Pt reports skin tear to LLE. Denies redness, rashes, lesions or ulcercations.  Neurological: Patient reports intermittent dizziness.  Denies difficulty with memory, difficulty with speech or problems with balance and coordination.  Psych: Denies anxiety, depression, SI/HI.  No other specific complaints in a complete review of systems (except as listed in HPI above).  Objective:   Physical Exam  BP 118/62 (BP Location: Left Arm, Patient Position: Sitting, Cuff Size: Large)   Ht 5' 9 (1.753 m)   Wt 249 lb 6.4 oz (113.1 kg)   BMI 36.83 kg/m    Wt Readings from Last 3 Encounters:  08/14/23 246 lb (111.6 kg)  08/11/23 246 lb 12.8 oz (111.9 kg)  07/15/23 250 lb (113.4 kg)     General: Appears his stated age, obese, in NAD. Skin: Warm, dry. Skin tear noted to left medial lower leg, mild surrounding erythema but no drainage noted.  HEENT: Head: normal shape and size; Eyes: sclera white, no icterus, conjunctiva pink, PERRLA and EOMs intact;  Neck:  Neck supple, trachea midline. No masses, lumps or thyromegaly present.  Cardiovascular: Normal rate and rhythm. S1,S2 noted.  No murmur, rubs or gallops noted. No JVD. Trace pitting LLE edema. No carotid bruits noted. Pulmonary/Chest: Normal effort and positive vesicular breath sounds. No respiratory distress. No wheezes, rales or ronchi noted.  Abdomen: Normal bowel sounds.  Musculoskeletal: Joint enlargement noted of left knee. Strength 5/5 BUE/BLE.  No difficulty with gait.  Neurological: Alert and oriented. Cranial nerves II-XII grossly intact. Coordination normal.  Psychiatric: Mood and affect normal. Behavior is normal. Judgment and thought content normal.     BMET    Component Value Date/Time   NA 141 12/12/2022 0437   NA 145 (H) 02/05/2021 0909   K 3.8 12/12/2022 0437   CL 106 12/12/2022 0437   CO2 26 12/12/2022 0437   GLUCOSE 88 12/12/2022 0437   BUN 24 (H) 12/12/2022 0437   BUN 16 02/05/2021 0909   CREATININE 1.38 (H) 12/12/2022 0437   CREATININE 1.32 11/08/2022 0948   CALCIUM 9.1 12/12/2022 0437   GFRNONAA 58 (L) 12/12/2022 0437   GFRAA >60 10/19/2019 1337   GFRAA >60 10/19/2019 1337    Lipid Panel     Component Value Date/Time   CHOL 157 11/08/2022 0948   CHOL 143 02/05/2021 0909   TRIG 71 11/08/2022 0948   HDL 47 11/08/2022 0948   HDL 48 02/05/2021 0909   CHOLHDL 3.3 11/08/2022 0948   VLDL 22.4 01/17/2020 1214   LDLCALC 94 11/08/2022 0948    CBC    Component Value Date/Time   WBC 7.1 12/12/2022 0437   RBC 5.00 12/12/2022 0437   HGB 15.8 12/12/2022 0437   HGB 16.2 02/05/2021 0909   HCT 45.2 12/12/2022 0437   HCT  45.5 02/05/2021 0909   PLT 159 12/12/2022 0437   PLT 195  02/05/2021 0909   MCV 90.4 12/12/2022 0437   MCV 88 02/05/2021 0909   MCH 31.6 12/12/2022 0437   MCHC 35.0 12/12/2022 0437   RDW 12.9 12/12/2022 0437   RDW 12.6 02/05/2021 0909   LYMPHSABS 2.1 12/12/2022 0437   LYMPHSABS 1.6 10/31/2017 1438   MONOABS 0.7 12/12/2022 0437   EOSABS 0.3 12/12/2022 0437   EOSABS 0.2 10/31/2017 1438   BASOSABS 0.1 12/12/2022 0437   BASOSABS 0.0 10/31/2017 1438    Hgb A1C Lab Results  Component Value Date   HGBA1C 5.1 11/08/2022          Assessment & Plan:   Preventative Health Maintenance:  Encouraged him to get a flu shot in the fall Tetanus UTD Encouraged him to get his COVID booster Prevnar 20 today Shingrix UTD per his report Cologuard ordered Encouraged him to consume a balanced diet and exercise regimen Advised to see an eye doctor and dentist annually We will check CBC, c-Met, lipid, A1c, PSA, uric acid and B12 today  RTC in 6 months, follow-up chronic conditions Angeline Laura, NP

## 2023-08-18 NOTE — Telephone Encounter (Signed)
 Requested Prescriptions  Pending Prescriptions Disp Refills   topiramate  (TOPAMAX ) 25 MG tablet [Pharmacy Med Name: TOPIRAMATE  25MG  TABLET] 90 tablet 2    Sig: TAKE ONE TABLET (25 MG TOTAL) BY MOUTH AT BEDTIME.     Neurology: Anticonvulsants - topiramate  & zonisamide Failed - 08/18/2023  2:57 PM      Failed - Cr in normal range and within 360 days    Creat  Date Value Ref Range Status  11/08/2022 1.32 0.70 - 1.35 mg/dL Final   Creatinine, Ser  Date Value Ref Range Status  12/12/2022 1.38 (H) 0.61 - 1.24 mg/dL Final         Passed - CO2 in normal range and within 360 days    CO2  Date Value Ref Range Status  12/12/2022 26 22 - 32 mmol/L Final   Bicarbonate  Date Value Ref Range Status  03/28/2015 29.2 (H) 21.0 - 28.0 mEq/L Final         Passed - ALT in normal range and within 360 days    ALT  Date Value Ref Range Status  11/08/2022 41 9 - 46 U/L Final         Passed - AST in normal range and within 360 days    AST  Date Value Ref Range Status  11/08/2022 30 10 - 35 U/L Final         Passed - Completed PHQ-2 or PHQ-9 in the last 360 days      Passed - Valid encounter within last 12 months    Recent Outpatient Visits           Today Encounter for general adult medical examination with abnormal findings   [facility] [place], Angeline ORN, NP   1 week ago Orthostatic hypotension   [place] [facility] [place], [place], NP   1 month ago Cellulitis of right upper extremity   [place] [facility] [place], Angeline ORN, NP   3 months ago Primary hypertension    [facility] [place], Angeline ORN, NP               furosemide  (LASIX ) 20 MG tablet [Pharmacy Med Name: FUROSEMIDE  20MG  TABLET] 90 tablet 2    Sig: TAKE ONE TABLET (20 MG TOTAL) BY MOUTH DAILY AS NEEDED.     Cardiovascular:  Diuretics - Loop Failed - 08/18/2023  2:57 PM      Failed - K in normal range and within 180 days     Potassium  Date Value Ref Range Status  12/12/2022 3.8 3.5 - 5.1 mmol/L Final         Failed - Ca in normal range and within 180 days    Calcium  Date Value Ref Range Status  12/12/2022 9.1 8.9 - 10.3 mg/dL Final   Calcium, Ion  Date Value Ref Range Status  02/25/2017 1.22 1.15 - 1.40 mmol/L Final         Failed - Na in normal range and within 180 days    Sodium  Date Value Ref Range Status  12/12/2022 141 135 - 145 mmol/L Final  02/05/2021 145 (H) 134 - 144 mmol/L Final         Failed - Cr in normal range and within 180 days    Creat  Date Value Ref Range Status  11/08/2022 1.32 0.70 - 1.35 mg/dL Final   Creatinine, Ser  Date Value Ref Range Status  12/12/2022  1.38 (H) 0.61 - 1.24 mg/dL Final         Failed - Cl in normal range and within 180 days    Chloride  Date Value Ref Range Status  12/12/2022 106 98 - 111 mmol/L Final         Failed - Mg Level in normal range and within 180 days    Magnesium   Date Value Ref Range Status  12/29/2016 1.7 1.7 - 2.4 mg/dL Final         Passed - Last BP in normal range    BP Readings from Last 1 Encounters:  08/18/23 118/62         Passed - Valid encounter within last 6 months    Recent Outpatient Visits           Today Encounter for general adult medical examination with abnormal findings   [facility] [place], Angeline ORN, NP   1 week ago Orthostatic hypotension   [place] [facility] [place], Angeline ORN, NP   1 month ago Cellulitis of right upper extremity   [place] [facility] [place], Angeline ORN, NP   3 months ago Primary hypertension   [place] [facility] [place], Angeline ORN, [place]

## 2023-08-18 NOTE — Assessment & Plan Note (Signed)
 Encouraged diet and exercise for weight loss ?

## 2023-08-18 NOTE — Patient Instructions (Signed)
 Health Maintenance, Male  Adopting a healthy lifestyle and getting preventive care are important in promoting health and wellness. Ask your health care provider about:  The right schedule for you to have regular tests and exams.  Things you can do on your own to prevent diseases and keep yourself healthy.  What should I know about diet, weight, and exercise?  Eat a healthy diet    Eat a diet that includes plenty of vegetables, fruits, low-fat dairy products, and lean protein.  Do not eat a lot of foods that are high in solid fats, added sugars, or sodium.  Maintain a healthy weight  Body mass index (BMI) is a measurement that can be used to identify possible weight problems. It estimates body fat based on height and weight. Your health care provider can help determine your BMI and help you achieve or maintain a healthy weight.  Get regular exercise  Get regular exercise. This is one of the most important things you can do for your health. Most adults should:  Exercise for at least 150 minutes each week. The exercise should increase your heart rate and make you sweat (moderate-intensity exercise).  Do strengthening exercises at least twice a week. This is in addition to the moderate-intensity exercise.  Spend less time sitting. Even light physical activity can be beneficial.  Watch cholesterol and blood lipids  Have your blood tested for lipids and cholesterol at 62 years of age, then have this test every 5 years.  You may need to have your cholesterol levels checked more often if:  Your lipid or cholesterol levels are high.  You are older than 61 years of age.  You are at high risk for heart disease.  What should I know about cancer screening?  Many types of cancers can be detected early and may often be prevented. Depending on your health history and family history, you may need to have cancer screening at various ages. This may include screening for:  Colorectal cancer.  Prostate cancer.  Skin cancer.  Lung  cancer.  What should I know about heart disease, diabetes, and high blood pressure?  Blood pressure and heart disease  High blood pressure causes heart disease and increases the risk of stroke. This is more likely to develop in people who have high blood pressure readings or are overweight.  Talk with your health care provider about your target blood pressure readings.  Have your blood pressure checked:  Every 3-5 years if you are 9-95 years of age.  Every year if you are 85 years old or older.  If you are between the ages of 29 and 29 and are a current or former smoker, ask your health care provider if you should have a one-time screening for abdominal aortic aneurysm (AAA).  Diabetes  Have regular diabetes screenings. This checks your fasting blood sugar level. Have the screening done:  Once every three years after age 23 if you are at a normal weight and have a low risk for diabetes.  More often and at a younger age if you are overweight or have a high risk for diabetes.  What should I know about preventing infection?  Hepatitis B  If you have a higher risk for hepatitis B, you should be screened for this virus. Talk with your health care provider to find out if you are at risk for hepatitis B infection.  Hepatitis C  Blood testing is recommended for:  Everyone born from [birth year] through 1964.  Anyone  with known risk factors for hepatitis C.  Sexually transmitted infections (STIs)  You should be screened each year for STIs, including gonorrhea and chlamydia, if:  You are sexually active and are younger than 62 years of age.  You are older than 62 years of age and your health care provider tells you that you are at risk for this type of infection.  Your sexual activity has changed since you were last screened, and you are at increased risk for chlamydia or gonorrhea. Ask your health care provider if you are at risk.  Ask your health care provider about whether you are at high risk for HIV. Your health care provider  may recommend a prescription medicine to help prevent HIV infection. If you choose to take medicine to prevent HIV, you should first get tested for HIV. You should then be tested every 3 months for as long as you are taking the medicine.  Follow these instructions at home:  Alcohol use  Do not drink alcohol if your health care provider tells you not to drink.  If you drink alcohol:  Limit how much you have to 0-2 drinks a day.  Know how much alcohol is in your drink. In the [place] one drink equals one 12 oz bottle of beer (355 mL), one 5 oz glass of wine (148 mL), or one 1 oz glass of hard liquor (44 mL).  Lifestyle  Do not use any products that contain nicotine or tobacco. These products include cigarettes, chewing tobacco, and vaping devices, such as e-cigarettes. If you need help quitting, ask your health care provider.  Do not use street drugs.  Do not share needles.  Ask your health care provider for help if you need support or information about quitting drugs.  General instructions  Schedule regular health, dental, and eye exams.  Stay current with your vaccines.  Tell your health care provider if:  You often feel depressed.  You have ever been abused or do not feel safe at home.  Summary  Adopting a healthy lifestyle and getting preventive care are important in promoting health and wellness.  Follow your health care provider's instructions about healthy diet, exercising, and getting tested or screened for diseases.  Follow your health care provider's instructions on monitoring your cholesterol and blood pressure.  This information is not intended to replace advice given to you by your health care provider. Make sure you discuss any questions you have with your health care provider.  Document Revised: 05/29/2020 Document Reviewed: 05/29/2020  Elsevier Patient Education  2024 [employer].

## 2023-08-19 ENCOUNTER — Ambulatory Visit: Payer: Self-pay | Admitting: Internal Medicine

## 2023-08-19 DIAGNOSIS — N1832 Chronic kidney disease, stage 3b: Secondary | ICD-10-CM

## 2023-08-19 LAB — COMPREHENSIVE METABOLIC PANEL WITH GFR
AG Ratio: 1.8 (calc) (ref 1.0–2.5)
ALT: 35 U/L (ref 9–46)
AST: 27 U/L (ref 10–35)
Albumin: 4.4 g/dL (ref 3.6–5.1)
Alkaline phosphatase (APISO): 77 U/L (ref 35–144)
BUN/Creatinine Ratio: 13 (calc) (ref 6–22)
BUN: 25 mg/dL (ref 7–25)
CO2: 28 mmol/L (ref 20–32)
Calcium: 9.5 mg/dL (ref 8.6–10.3)
Chloride: 106 mmol/L (ref 98–110)
Creat: 1.96 mg/dL — ABNORMAL HIGH (ref 0.70–1.35)
Globulin: 2.4 g/dL (ref 1.9–3.7)
Glucose, Bld: 71 mg/dL (ref 65–99)
Potassium: 4.5 mmol/L (ref 3.5–5.3)
Sodium: 142 mmol/L (ref 135–146)
Total Bilirubin: 0.4 mg/dL (ref 0.2–1.2)
Total Protein: 6.8 g/dL (ref 6.1–8.1)
eGFR: 38 mL/min/1.73m2 — ABNORMAL LOW (ref >=60)

## 2023-08-19 LAB — LIPID PANEL
Cholesterol: 180 mg/dL (ref <200)
HDL: 42 mg/dL (ref >=40)
LDL Cholesterol (Calc): 107 mg/dL — ABNORMAL HIGH
Non-HDL Cholesterol (Calc): 138 mg/dL — ABNORMAL HIGH (ref <130)
Total CHOL/HDL Ratio: 4.3 (calc) (ref <5.0)
Triglycerides: 192 mg/dL — ABNORMAL HIGH (ref <150)

## 2023-08-19 LAB — CBC
HCT: 41.9 % (ref 38.5–50.0)
Hemoglobin: 14 g/dL (ref 13.2–17.1)
MCH: 31.2 pg (ref 27.0–33.0)
MCHC: 33.4 g/dL (ref 32.0–36.0)
MCV: 93.3 fL (ref 80.0–100.0)
MPV: 11.4 fL (ref 7.5–12.5)
Platelets: 198 Thousand/uL (ref 140–400)
RBC: 4.49 Million/uL (ref 4.20–5.80)
RDW: 13.2 % (ref 11.0–15.0)
WBC: 8.6 Thousand/uL (ref 3.8–10.8)

## 2023-08-19 LAB — HEMOGLOBIN A1C
Hgb A1c MFr Bld: 5.4 % (ref <5.7)
Mean Plasma Glucose: 108 mg/dL
eAG (mmol/L): 6 mmol/L

## 2023-08-19 LAB — PSA: PSA: 0.37 ng/mL (ref <=4.00)

## 2023-08-19 LAB — URIC ACID: Uric Acid, Serum: 7.7 mg/dL (ref 4.0–8.0)

## 2023-08-19 LAB — VITAMIN B12: Vitamin B-12: 249 pg/mL (ref 200–1100)

## 2023-08-19 MED ORDER — CYANOCOBALAMIN 1000 MCG/ML IJ SOLN: 1000.0000 ug | Freq: Once | INTRAMUSCULAR | 0 refills | Status: AC

## 2023-08-19 NOTE — Telephone Encounter (Signed)
 Spoke with patient and advised his lab results. Patient states he hasn't taken the Furosemide  in a while. Lab appointment scheduled for 2 weeks and patient is also agreeable to B12 injections. I have sent in the B12 injections and have him scheduled for his 1st dose next week. Thanks!

## 2023-08-20 ENCOUNTER — Telehealth: Payer: Self-pay

## 2023-08-20 MED ORDER — CYANOCOBALAMIN 1000 MCG/ML IJ SOLN: 1000.0000 ug | Freq: Once | INTRAMUSCULAR | 0 refills | Status: AC

## 2023-08-20 NOTE — Telephone Encounter (Signed)
 Copied from CRM [id number]. Topic: Clinical - Medication Question >> Aug 19, 2023  4:45 PM Santiya F wrote: Reason for CRM: [place] with [facility] is calling in because he needs clarification for a prescription for cyanocobalamin  (VITAMIN B12) 1000 MCG/ML injection [[id number]]. He needs to know how many days the injection needs to be administered. He is also asking if the patient is going to inject the medication himself and if so he could prescribe syringes for that.

## 2023-08-22 ENCOUNTER — Other Ambulatory Visit: Payer: Self-pay | Admitting: Family Medicine

## 2023-08-22 DIAGNOSIS — M150 Primary generalized (osteo)arthritis: Secondary | ICD-10-CM

## 2023-08-22 NOTE — Telephone Encounter (Signed)
 Requested medications are due for refill today.  A little too soon  Requested medications are on the active medications list.  yes  Last refill. 07/28/2023 #30 0 rf  Future visit scheduled.   yes  Notes to clinic.  Refill/refusal not delegated.    Requested Prescriptions  Pending Prescriptions Disp Refills   traMADol  (ULTRAM ) 50 MG tablet [Pharmacy Med Name: TRAMADOL  HYDROCHLORIDE 50MG  TABLET] 30 tablet 0    Sig: TAKE ONE TABLET (50 MG TOTAL) BY MOUTH DAILY AS NEEDED.     Not Delegated - Analgesics:  Opioid Agonists Failed - 08/22/2023  5:52 PM      Failed - This refill cannot be delegated      Failed - Urine Drug Screen completed in last 360 days      Passed - Valid encounter within last 3 months    Recent Outpatient Visits           4 days ago Encounter for general adult medical examination with abnormal findings   [place] [facility] [place], Angeline ORN, NP   1 week ago Orthostatic hypotension   [place] [facility] [place], Angeline ORN, NP   1 month ago Cellulitis of right upper extremity   [place] [facility] [place], Angeline ORN, NP   3 months ago Primary hypertension   [facility] [place], Angeline ORN, [place]

## 2023-08-27 ENCOUNTER — Ambulatory Visit

## 2023-08-27 DIAGNOSIS — E538 Deficiency of other specified B group vitamins: Secondary | ICD-10-CM

## 2023-08-27 MED ORDER — CYANOCOBALAMIN 1000 MCG/ML IJ SOLN
1000.0000 ug | Freq: Once | INTRAMUSCULAR | Status: AC
Start: 2023-08-27 — End: 2023-08-27
  Administered 2023-08-27: 1000 ug via INTRAMUSCULAR

## 2023-08-29 ENCOUNTER — Ambulatory Visit: Payer: Self-pay | Admitting: Internal Medicine

## 2023-08-29 LAB — COLOGUARD: COLOGUARD: NEGATIVE

## 2023-09-02 ENCOUNTER — Ambulatory Visit

## 2023-09-02 ENCOUNTER — Encounter: Payer: Self-pay | Admitting: Internal Medicine

## 2023-09-02 ENCOUNTER — Ambulatory Visit: Admitting: Internal Medicine

## 2023-09-02 ENCOUNTER — Other Ambulatory Visit

## 2023-09-02 VITALS — BP 122/70 | Ht 69.0 in | Wt 246.6 lb

## 2023-09-02 DIAGNOSIS — G43109 Migraine with aura, not intractable, without status migrainosus: Secondary | ICD-10-CM

## 2023-09-02 DIAGNOSIS — N1832 Chronic kidney disease, stage 3b: Secondary | ICD-10-CM

## 2023-09-02 DIAGNOSIS — I10 Essential (primary) hypertension: Secondary | ICD-10-CM | POA: Diagnosis not present

## 2023-09-02 DIAGNOSIS — R6889 Other general symptoms and signs: Secondary | ICD-10-CM

## 2023-09-02 DIAGNOSIS — E538 Deficiency of other specified B group vitamins: Secondary | ICD-10-CM | POA: Diagnosis not present

## 2023-09-02 MED ORDER — CYANOCOBALAMIN 1000 MCG/ML IJ SOLN
1000.0000 ug | Freq: Once | INTRAMUSCULAR | Status: AC
  Administered 2023-09-02 (×2): 1000 ug via INTRAMUSCULAR

## 2023-09-02 NOTE — Patient Instructions (Signed)
 Dizziness Dizziness is a common problem. It makes you feel unsteady or light-headed. You may feel like you're about to faint. Dizziness can lead to getting hurt if you stumble or fall. It's more common to feel dizzy if you're an older adult. Many things can cause you to feel dizzy. These include: Medicines. Dehydration. This is when there's not enough water in your body. Illness. Follow these instructions at home: Eating and drinking  Drink enough fluid to keep your pee (urine) pale yellow. This helps keep you from getting dehydrated. Try to drink more clear fluids, such as water. Do not drink alcohol. Try to limit how much caffeine you take in. Try to limit how much salt, also called sodium, you take in. Activity Try not to make quick movements. Stand up slowly from sitting in a chair. Steady yourself until you feel okay. In the morning, first sit up on the side of the bed. When you feel okay, hold onto something and slowly stand up. Do this until you know that your balance is okay. If you need to stand in one place for a long time, move your legs often. Tighten and relax the muscles in your legs while you're standing. Do not drive or use machines if you feel dizzy. Avoid bending down if you feel dizzy. Place items in your home so you can reach them without leaning over. Lifestyle Do not smoke, vape, or use products with nicotine or tobacco in them. If you need help quitting, talk with your health care provider. Try to lower your stress level. You can do this by using methods like yoga or meditation. Talk with your provider if you need help. General instructions Watch your dizziness for any changes. Take your medicines only as told by your provider. Talk with your provider if you think you're dizzy because of a medicine you're taking. Tell a friend or a family member that you're feeling dizzy. If they spot any changes in your behavior, have them call your provider. Contact a health care  provider if: Your dizziness doesn't go away, or you have new symptoms. Your dizziness gets worse. You feel like you may vomit. You have trouble hearing. You have a fever. You have neck pain or a stiff neck. You fall or get hurt. Get help right away if: You vomit each time you eat or drink. You have watery poop and can't eat or drink. You have trouble talking, walking, swallowing, or using your arms, hands, or legs. You feel very weak. You're bleeding. You're not thinking clearly, or you have trouble forming sentences. A friend or family member may spot this. Your vision changes, or you get a very bad headache. These symptoms may be an emergency. Call 911 right away. Do not wait to see if the symptoms will go away. Do not drive yourself to the hospital. This information is not intended to replace advice given to you by your health care provider. Make sure you discuss any questions you have with your health care provider. Document Revised: 10/10/2022 Document Reviewed: 02/21/2022 Elsevier Patient Education  2024 [employer].

## 2023-09-02 NOTE — Progress Notes (Signed)
 Subjective:    Patient ID: Richard [name], male    DOB: 12-Jul-1960, 63 y.o.   MRN: [id number]  HPI  Discussed the use of AI scribe software for clinical note transcription with the patient, who gave verbal consent to proceed.  Richard [name] is a 63 year old male who presents with dizziness and withdrawal symptoms after abrupt cessation of medications.  He has been experiencing dizziness and a sensation of feeling 'drunk' for the past two to three weeks but worse in the last few days. These symptoms began after the abrupt cessation of venlafaxine , nortriptyline and topamax  which was stopped three and a half weeks ago. The patient reports experiencing dizziness, jitteriness, nausea, and vomiting, but denies suicidal thoughts.   His blood pressure medication was also stopped 3 weeks ago due to hypotension and near syncope., He has monitored his blood pressure readings at home, with measurements of 114/70 and 104/61. He feels particularly unwell after physical exertion, such as weed eating.       Review of Systems     Past Medical History:  Diagnosis Date   Arthritis    Arthrofibrosis of total knee arthroplasty (HCC) 05/30/2015   Bilateral pulmonary embolism (HCC)    a. 03/2015 CTA: acute bilat PE; b. IVC filter placed in 2017--> removed 2018.   Chest pain    a. 2015 Abnl MV;  b. 2015 Cath: nl cors.   Chronic diastolic CHF (congestive heart failure) (HCC)    a. 03/2015 Echo: EF 55-65%, poor windows, mildly dil RV.   DVT (deep venous thrombosis) (HCC)    a. 03/2015 U/S: occlusive DVT w/in the distal aspect of the Left fem vein through the L popliteal vein.   Dyspnea    On exertion   GERD (gastroesophageal reflux disease)    Headache    Hypertension    Hypertensive heart disease    Obesity    OSA (obstructive sleep apnea)    uses CPAP nightly settings at 16    PAF (paroxysmal atrial fibrillation) (HCC)    Pneumonia    hx of years ago    Primary localized osteoarthritis of  left knee    a. 11/2014 s/p L TKA.   Primary localized osteoarthritis of right knee    a. 02/2014 s/p R Partial Knee arthroplasty.   Stiffness of left knee    a. 12/2014 s/p manipulation under anesthesia.   Urine incontinence     Current Outpatient Medications  Medication Sig Dispense Refill   allopurinol  (ZYLOPRIM ) 100 MG tablet TAKE ONE TABLET (100 MG TOTAL) BY MOUTH DAILY. 90 tablet 1   baclofen  (LIORESAL ) 10 MG tablet TAKE ONE TABLET (10 MG TOTAL) BY MOUTH TWO TIMES DAILY. 180 tablet 1   colchicine  0.6 MG tablet TAKE ONE TABLET BY MOUTH TWICE A DAY 180 tablet 1   ELIQUIS  5 MG TABS tablet TAKE ONE TABLET BY MOUTH TWO TIMES DAILY 180 tablet 1   flecainide  (TAMBOCOR ) 100 MG tablet TAKE ONE TABLET BY MOUTH TWICE DAILY 180 tablet 2   furosemide  (LASIX ) 20 MG tablet TAKE ONE TABLET (20 MG TOTAL) BY MOUTH DAILY AS NEEDED. 90 tablet 2   metoprolol  succinate (TOPROL -XL) 25 MG 24 hr tablet Take 25 mg by mouth.     Multiple Vitamins-Minerals (MULTIVITAMIN WITH MINERALS) tablet Take 1 tablet by mouth daily.     pantoprazole  (PROTONIX ) 40 MG tablet TAKE ONE TABLET BY MOUTH ONCE A DAY 90 tablet 0   Sennosides-Docusate Sodium  (SM STOOL SOFTENER/LAXATIVE PO)  Take 8.6 mg by mouth daily.     topiramate  (TOPAMAX ) 25 MG tablet TAKE ONE TABLET (25 MG TOTAL) BY MOUTH AT BEDTIME. 90 tablet 2   traMADol  (ULTRAM ) 50 MG tablet Take 1 tablet (50 mg total) by mouth daily as needed. 30 tablet 0   venlafaxine  (EFFEXOR ) 37.5 MG tablet Take 1 tablet (37.5 mg total) by mouth daily. 90 tablet 1   No current facility-administered medications for this visit.    Allergies  Allergen Reactions   Penicillins Anaphylaxis and Other (See Comments)    From childhood; uncertain of reaction Has patient had a PCN reaction causing immediate rash, facial/tongue/throat swelling, SOB or lightheadedness with hypotension: Unk Has patient had a PCN reaction causing severe rash involving mucus membranes or skin necrosis: Unk Has  patient had a PCN reaction that required hospitalization: Unk Has patient had a PCN reaction occurring within the last 10 years: No If all of the above answers are NO, then may proceed with Cephalosporin use.   Vancomycin  Other (See Comments)    Red Mans' syndrome   Penicillins Other (See Comments)    Childhood   Vancomycin  Other (See Comments)    Red man Syndrome    Family History  Problem Relation Age of Onset   Coronary artery disease Mother    Hyperlipidemia Mother    Hypertension Mother    Arthritis Mother    Coronary artery disease Father    Heart disease Paternal Grandmother    Alzheimer's disease Paternal Grandfather     Social History   Socioeconomic History   Marital status: Married    Spouse name: Markevious Ehmke   Number of children: 2   Years of education: 12   Highest education level: GED or equivalent  Occupational History   Occupation: Acupuncturist: Engineer, [employer]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) [employer]  Tobacco Use   Smoking status: Never   Smokeless tobacco: Never  Vaping Use   Vaping status: Never Used  Substance and Sexual Activity   Alcohol use: Not Currently    Comment: \   Drug use: No   Sexual activity: Yes  Other Topics Concern   Not on file  Social History Narrative   ** Merged History Encounter **       Social Drivers of Health   Financial Resource Strain: Low Risk  (08/17/2023)   Overall Financial Resource Strain (CARDIA)    Difficulty of Paying Living Expenses: Not hard at all  Food Insecurity: No Food Insecurity (08/17/2023)   Hunger Vital Sign    Worried About Running Out of Food in the Last Year: Never true    Ran Out of Food in the Last Year: Never true  Transportation Needs: Unknown (08/17/2023)   PRAPARE - Administrator, Civil Service (Medical): No    Lack of Transportation (Non-Medical): Not on file  Physical Activity: Insufficiently Active (08/17/2023)   Exercise Vital Sign    Days of Exercise per Week: 5 days    Minutes of Exercise per Session:  10 min  Stress: No Stress Concern Present (08/17/2023)   [employer] of Occupational Health - Occupational Stress Questionnaire    Feeling of Stress: Not at all  Social Connections: Unknown (08/17/2023)   Social Connection and Isolation Panel    Frequency of Communication with Friends and Family: Three times a week    Frequency of Social Gatherings with Friends and Family: Three times a week    Attends Religious Services: 1 to 4 times per year  Active Member of Clubs or Organizations: Not on file    Attends Club or Organization Meetings: Not on file    Marital Status: Married  Intimate Partner Violence: Not At Risk (08/14/2023)   Humiliation, Afraid, Rape, and Kick questionnaire    Fear of Current or Ex-Partner: No    Emotionally Abused: No    Physically Abused: No    Sexually Abused: No     Constitutional: Pt reports intermittent headaches. Denies fever, malaise, fatigue, or abrupt weight changes.  HEENT: Patient reports vision changes.  Denies eye pain, eye redness, ear pain, ringing in the ears, wax buildup, runny nose, nasal congestion, bloody nose, or sore throat. Respiratory: Denies difficulty breathing, shortness of breath, cough or sputum production.   Cardiovascular: Denies chest pain, chest tightness, palpitations or swelling in the hands or feet.  Gastrointestinal: Pt reports intermittent reflux. Denies abdominal pain, bloating, constipation, diarrhea or blood in the stool.  GU: Denies urgency, frequency, pain with urination, burning sensation, blood in urine, odor or discharge. Musculoskeletal: Patient reports joint pain.  Denies decrease in range of motion, difficulty with gait, muscle pain or joint swelling.  Skin: Pt reports skin tear to LLE. Denies redness, rashes, lesions or ulcercations.  Neurological: Patient reports intermittent dizziness.  Denies difficulty with memory, difficulty with speech or problems with balance and coordination.  Psych: Denies anxiety,  depression, SI/HI.  No other specific complaints in a complete review of systems (except as listed in HPI above).  Objective:   Physical Exam  BP 122/70   Ht 5' 9 (1.753 m)   Wt 246 lb 9.6 oz (111.9 kg)   BMI 36.42 kg/m     Wt Readings from Last 3 Encounters:  08/18/23 249 lb 6.4 oz (113.1 kg)  08/14/23 246 lb (111.6 kg)  08/11/23 246 lb 12.8 oz (111.9 kg)    General: Appears his stated age, obese, in NAD. Cardiovascular: Normal rate and rhythm. S1,S2 noted.  No murmur, rubs or gallops noted.  Pulmonary/Chest: Normal effort and positive vesicular breath sounds. No respiratory distress. No wheezes, rales or ronchi noted.  Musculoskeletal: No difficulty with gait.  Neurological: Alert and oriented.  Coordination normal.     BMET    Component Value Date/Time   NA 142 08/18/2023 1324   NA 145 (H) 02/05/2021 0909   K 4.5 08/18/2023 1324   CL 106 08/18/2023 1324   CO2 28 08/18/2023 1324   GLUCOSE 71 08/18/2023 1324   BUN 25 08/18/2023 1324   BUN 16 02/05/2021 0909   CREATININE 1.96 (H) 08/18/2023 1324   CALCIUM 9.5 08/18/2023 1324   GFRNONAA 58 (L) 12/12/2022 0437   GFRAA >60 10/19/2019 1337   GFRAA >60 10/19/2019 1337    Lipid Panel     Component Value Date/Time   CHOL 180 08/18/2023 1324   CHOL 143 02/05/2021 0909   TRIG 192 (H) 08/18/2023 1324   HDL 42 08/18/2023 1324   HDL 48 02/05/2021 0909   CHOLHDL 4.3 08/18/2023 1324   VLDL 22.4 01/17/2020 1214   LDLCALC 107 (H) 08/18/2023 1324    CBC    Component Value Date/Time   WBC 8.6 08/18/2023 1324   RBC 4.49 08/18/2023 1324   HGB 14.0 08/18/2023 1324   HGB 16.2 02/05/2021 0909   HCT 41.9 08/18/2023 1324   HCT 45.5 02/05/2021 0909   PLT 198 08/18/2023 1324   PLT 195 02/05/2021 0909   MCV 93.3 08/18/2023 1324   MCV 88 02/05/2021 0909   MCH 31.2  08/18/2023 1324   MCHC 33.4 08/18/2023 1324   RDW 13.2 08/18/2023 1324   RDW 12.6 02/05/2021 0909   LYMPHSABS 2.1 12/12/2022 0437   LYMPHSABS 1.6  10/31/2017 1438   MONOABS 0.7 12/12/2022 0437   EOSABS 0.3 12/12/2022 0437   EOSABS 0.2 10/31/2017 1438   BASOSABS 0.1 12/12/2022 0437   BASOSABS 0.0 10/31/2017 1438    Hgb A1C Lab Results  Component Value Date   HGBA1C 5.4 08/18/2023          Assessment & Plan:   Assessment and Plan    Medication withdrawal symptoms due to abrupt discontinuation of antidepressants and migraine prophylactic agents Withdrawal symptoms from abrupt discontinuation of venlafaxine , nortriptyline, and Topamax . Symptoms include dizziness, jitteriness, and sensation of feeling drunk. Symptoms consistent with withdrawal, should have tapered medications. Emgality administration is expected soon. - Advise gradual tapering of medications in the future. - Encourage increased fluid intake. - Instruct to monitor blood pressure at home. - Discussed symptoms should improve as withdrawal period ends. - Advise to contact neurologist's office for administration of Emgality.  Migraine with visual symptoms (ocular migraine) Experiencing ocular migraines with visual disturbances, no pain. Awaiting Emgality administration for prophylaxis. - Advise to contact neurologist for administration of Emgality.  Hypertension Variable home blood pressure readings, recent readings do not indicate need for medication. Dizziness attributed to medication withdrawal, not hypertension. - Advise against restarting blood pressure medication. - Instruct to continue monitoring blood pressure at home.        RTC in 5 months, follow-up chronic conditions Angeline Laura, NP

## 2023-09-03 ENCOUNTER — Ambulatory Visit: Payer: Self-pay | Admitting: Internal Medicine

## 2023-09-03 LAB — RENAL FUNCTION PANEL
Albumin: 4.5 g/dL (ref 3.6–5.1)
BUN/Creatinine Ratio: 16 (calc) (ref 6–22)
BUN: 23 mg/dL (ref 7–25)
CO2: 23 mmol/L (ref 20–32)
Calcium: 9.3 mg/dL (ref 8.6–10.3)
Chloride: 106 mmol/L (ref 98–110)
Creat: 1.4 mg/dL — ABNORMAL HIGH (ref 0.70–1.35)
Glucose, Bld: 87 mg/dL (ref 65–99)
Phosphorus: 3.6 mg/dL (ref 2.5–4.5)
Potassium: 4.1 mmol/L (ref 3.5–5.3)
Sodium: 140 mmol/L (ref 135–146)

## 2023-09-05 ENCOUNTER — Telehealth: Payer: Self-pay

## 2023-09-05 NOTE — Telephone Encounter (Signed)
 Copied from CRM #[id number]. Topic: Clinical - Medication Question >> Sep 05, 2023  9:45 AM Charlet HERO wrote: Reason for CRM: Patient would like to know when he will need his next appt for B-12 shot would like to have someone call with information.

## 2023-09-05 NOTE — Telephone Encounter (Signed)
 He should be doing weekly for 4 weeks, then monthly thereafter.

## 2023-09-05 NOTE — Telephone Encounter (Signed)
 Spoke with patient, appointments scheduled for the next 2 weeks

## 2023-09-09 ENCOUNTER — Ambulatory Visit

## 2023-09-09 DIAGNOSIS — E538 Deficiency of other specified B group vitamins: Secondary | ICD-10-CM

## 2023-09-09 MED ORDER — CYANOCOBALAMIN 1000 MCG/ML IJ SOLN
1000.0000 ug | Freq: Once | INTRAMUSCULAR | Status: AC
  Administered 2023-09-09: 1000 ug via INTRAMUSCULAR

## 2023-09-16 ENCOUNTER — Ambulatory Visit

## 2023-09-19 ENCOUNTER — Telehealth: Payer: Self-pay

## 2023-09-19 ENCOUNTER — Ambulatory Visit

## 2023-09-19 DIAGNOSIS — E538 Deficiency of other specified B group vitamins: Secondary | ICD-10-CM

## 2023-09-19 MED ORDER — CYANOCOBALAMIN 1000 MCG/ML IJ SOLN: 1000.0000 ug | INTRAMUSCULAR | 0 refills | Status: DC

## 2023-09-19 MED ORDER — SYRINGE/NEEDLE (DISP) 25G X 1" 1 ML MISC
0 refills | Status: AC
Start: 2023-09-19 — End: ?

## 2023-09-19 MED ORDER — CYANOCOBALAMIN 1000 MCG/ML IJ SOLN
1000.0000 ug | Freq: Once | INTRAMUSCULAR | Status: AC
  Administered 2023-09-19: 1000 ug via INTRAMUSCULAR

## 2023-09-19 MED ORDER — SYRINGE (DISPOSABLE) 3 ML MISC: 1.0000 | 0 refills | Status: AC

## 2023-09-19 NOTE — Telephone Encounter (Signed)
 Clarified Rx with pharmacy. No further action needed.

## 2023-09-19 NOTE — Telephone Encounter (Signed)
LM advising patient  °

## 2023-09-19 NOTE — Telephone Encounter (Signed)
 Copied from CRM [id number]. Topic: Clinical - Prescription Issue >> Sep 19, 2023 10:04 AM Winona R wrote: [facility] calling with questions about pts Syringe, Disposable, 3 ML MISC, Syringe/Needle, Disp, 25G X 1 1 ML MISC and cyanocobalamin  (VITAMIN B12) 1000 MCG/ML injection

## 2023-09-19 NOTE — Telephone Encounter (Signed)
 Patient was in for his last weekly B12 injection this morning. He is wanting a refill on the monthly injections, and also wanting to know if he can get needles and syringes to administer at home to himself. Please advise. Thanks!

## 2023-09-19 NOTE — Addendum Note (Signed)
 Addended by: ANTONETTE ANGELINE ORN on: 09/19/2023 09:43 AM   Modules accepted: Orders

## 2023-09-19 NOTE — Telephone Encounter (Signed)
 B12 refilled along with needles and syringes

## 2023-09-25 ENCOUNTER — Other Ambulatory Visit: Payer: Self-pay | Admitting: Internal Medicine

## 2023-09-25 NOTE — Telephone Encounter (Signed)
 Requested Prescriptions  Refused Prescriptions Disp Refills   olmesartan  (BENICAR ) 20 MG tablet [Pharmacy Med Name: OLMESARTAN  MEDOXOMIL 20MG  TABLET] 90 tablet 0    Sig: TAKE ONE TABLET (20 MG TOTAL) BY MOUTH DAILY.     Cardiovascular:  Angiotensin Receptor Blockers Failed - 09/25/2023  5:29 PM      Failed - Cr in normal range and within 180 days    Creat  Date Value Ref Range Status  09/02/2023 1.40 (H) 0.70 - 1.35 mg/dL Final         Passed - K in normal range and within 180 days    Potassium  Date Value Ref Range Status  09/02/2023 4.1 3.5 - 5.3 mmol/L Final         Passed - Patient is not pregnant      Passed - Last BP in normal range    BP Readings from Last 1 Encounters:  09/02/23 122/70         Passed - Valid encounter within last 6 months    Recent Outpatient Visits           3 weeks ago B12 deficiency   [facility] [place], Angeline ORN, NP   1 month ago Encounter for general adult medical examination with abnormal findings   [place] [facility] [place], Angeline ORN, NP   1 month ago Orthostatic hypotension   [place] [facility] [place], Angeline ORN, NP   2 months ago Cellulitis of right upper extremity   [place] [facility] [place], Angeline ORN, NP   4 months ago Primary hypertension   [place] [facility] [place], Angeline ORN, [place]

## 2023-09-26 NOTE — Progress Notes (Deleted)
 [facility] [address] [place], [place] [address]  Pulmonary Sleep Medicine   Office Visit Note  Patient Name: Richard [name] DOB: 05/18/1960 MRN [id number]    Chief Complaint: Obstructive Sleep Apnea visit  Brief History:  Richard [name] is seen today for an annual follow up visit for CPAP@ 17 cmH2O. The patient has a 11 year history of sleep apnea. Patient is using PAP nightly.  The patient feels rested after sleeping with PAP.  The patient reports benefiting from PAP use. Reported sleepiness is  improved and the Epworth Sleepiness Score is *** out of 24. The patient *** take naps. The patient complains of the following: ***  The compliance download shows 83% compliance with an average use time of 5 hours 44 minutes. The AHI is 1.9.  The patient *** of limb movements disrupting sleep. The patient continues to require PAP therapy in order to eliminate sleep apnea.   ROS  General: (-) fever, (-) chills, (-) night sweat Nose and Sinuses: (-) nasal stuffiness or itchiness, (-) postnasal drip, (-) nosebleeds, (-) sinus trouble. Mouth and Throat: (-) sore throat, (-) hoarseness. Neck: (-) swollen glands, (-) enlarged thyroid , (-) neck pain. Respiratory: *** [name], *** shortness of breath, *** wheezing. Neurologic: *** numbness, *** tingling. Psychiatric: *** anxiety, *** depression   Current Medication: Outpatient Encounter Medications as of 09/29/2023  Medication Sig   allopurinol  (ZYLOPRIM ) 100 MG tablet TAKE ONE TABLET (100 MG TOTAL) BY MOUTH DAILY.   baclofen  (LIORESAL ) 10 MG tablet TAKE ONE TABLET (10 MG TOTAL) BY MOUTH TWO TIMES DAILY.   colchicine  0.6 MG tablet TAKE ONE TABLET BY MOUTH TWICE A DAY   cyanocobalamin  (VITAMIN B12) 1000 MCG/ML injection Inject 1 mL (1,000 mcg total) into the skin every 30 (thirty) days.   ELIQUIS  5 MG TABS tablet TAKE ONE TABLET BY MOUTH TWO TIMES DAILY   esomeprazole (NEXIUM) 40 MG capsule Take by mouth.   flecainide  (TAMBOCOR ) 100 MG  tablet TAKE ONE TABLET BY MOUTH TWICE DAILY   furosemide  (LASIX ) 20 MG tablet TAKE ONE TABLET (20 MG TOTAL) BY MOUTH DAILY AS NEEDED.   Galcanezumab-gnlm 120 MG/ML SOAJ Inject 120 mg into the skin.   metoprolol  succinate (TOPROL -XL) 25 MG 24 hr tablet Take 25 mg by mouth.   pantoprazole  (PROTONIX ) 40 MG tablet TAKE ONE TABLET BY MOUTH ONCE A DAY   Syringe, Disposable, 3 ML MISC 1 Device by Does not apply route every 30 (thirty) days.   Syringe/Needle, Disp, 25G X 1 1 ML MISC Use for B12 injection 1 mL per dose. First 4 weeks, inject 1 dose weekly. Next 4 months injection one dose monthly.   traMADol  (ULTRAM ) 50 MG tablet Take 1 tablet (50 mg total) by mouth daily as needed.   No facility-administered encounter medications on file as of 09/29/2023.    Surgical History: Past Surgical History:  Procedure Laterality Date   APPENDECTOMY     CARDIAC CATHETERIZATION     CARPAL TUNNEL RELEASE Left 03/2018   CYSTOSCOPY W/ RETROGRADES  12/09/2019   Procedure: CYSTOSCOPY WITH RETROGRADE PYELOGRAM;  Surgeon: Richard Ozell SAUNDERS, MD;  Location: [facility] ORS;  Service: Urology;;   CYSTOSCOPY WITH STENT PLACEMENT Right 12/09/2019   Procedure: CYSTOSCOPY WITH STENT PLACEMENT;  Surgeon: Richard Ozell SAUNDERS, MD;  Location: [facility];  Service: Urology;  Laterality: Right;   EXAM UNDER ANESTHESIA WITH MANIPULATION OF KNEE Left 05/30/2015   Procedure: EXAM UNDER ANESTHESIA WITH MANIPULATION OF LEFT KNEE;  Surgeon: Richard Olmsted, MD;  Location: [facility];  Service: Orthopedics;  Laterality: Left;   HIATAL HERNIA REPAIR N/A 10/26/2019   Procedure: HIATAL HERNIA REPAIR;  Surgeon: Richard Cough, MD;  Location: [facility];  Service: General;  Laterality: N/A;   I & D KNEE WITH POLY EXCHANGE Left 10/30/2015   Procedure: IRRIGATION AND DEBRIDEMENT KNEE WITH POLY EXCHANGE PLACE SPACERS;  Surgeon: Richard Sensor, MD;  Location: [facility];  Service: Orthopedics;  Laterality: Left;  OFF ELIQUIST 3 DAYS   IVC FILTER PLACEMENT ([facility] HX)  04/13/15    IVC FILTER REMOVAL N/A 08/13/2016   Procedure: IVC Filter Removal;  Surgeon: Richard Richard MATSU, MD;  Location: [facility];  Service: Cardiovascular;  Laterality: N/A;   JOINT REPLACEMENT     KNEE ARTHROSCOPY Left 08/08/2015   Procedure: LEFT KNEE MANIPULATION WITH ARTHROSCOPIC LYSIS OF ADHESIONS AND ASPIRATION;  Surgeon: Richard Olmsted, MD;  Location: [facility];  Service: Orthopedics;  Laterality: Left;   KNEE CLOSED REDUCTION Left 01/20/2015   Procedure: LEFT KNEE MANIPULATION;  Surgeon: Richard Olmsted, MD;  Location: [facility];  Service: Orthopedics;  Laterality: Left;   LAPAROSCOPIC GASTRIC SLEEVE RESECTION N/A 10/26/2019   Procedure: LAPAROSCOPIC SLEEVE GASTRECTOMY;  Surgeon: Richard Cough, MD;  Location: [facility];  Service: General;  Laterality: N/A;   LEFT HEART CATHETERIZATION WITH CORONARY ANGIOGRAM N/A 01/12/2014   Procedure: LEFT HEART CATHETERIZATION WITH CORONARY ANGIOGRAM;  Surgeon: Richard GORMAN Reek, MD;  Location: [facility] C[facility];  Service: Cardiovascular;  Laterality: N/A;   ORTHOPEDIC SURGERY Left    arthroscopy x3   PARTIAL KNEE ARTHROPLASTY Right 02/25/2014   Procedure: RIGHT KNEE ARTHROPLASTY CONDYLE AND PLATEAU MEDIAL COMPARTMENT ;  Surgeon: Richard SHAUNNA Olmsted, MD;  Location: [facility];  Service: Orthopedics;  Laterality: Right;   PERIPHERAL VASCULAR CATHETERIZATION N/A 03/29/2015   Procedure: IVC Filter Insertion, and possible thrombectomy;  Surgeon: Richard [name] Jama, MD;  Location: [facility];  Service: Cardiovascular;  Laterality: N/A;   ROTATOR CUFF REPAIR Left    TOTAL KNEE ARTHROPLASTY  12/06/2014   Procedure: TOTAL KNEE ARTHROPLASTY;  Surgeon: Richard Olmsted, MD;  Location: [facility] OR;  Service: Orthopedics;;   TOTAL KNEE REVISION Left 02/08/2016   Procedure: TOTAL KNEE REVISION;  Surgeon: Richard Sensor, MD;  Location: [facility];  Service: Orthopedics;  Laterality: Left;   UPPER GI ENDOSCOPY N/A 10/26/2019   Procedure: UPPER GI ENDOSCOPY;   Surgeon: Richard Cough, MD;  Location: [facility];  Service: General;  Laterality: N/A;   URETEROSCOPY WITH HOLMIUM LASER LITHOTRIPSY Right 12/30/2019   Procedure: URETEROSCOPY WITH HOLMIUM LASER LITHOTRIPSY;  Surgeon: Richard Ozell SAUNDERS, MD;  Location: [facility];  Service: Urology;  Laterality: Right;    Medical History: Past Medical History:  Diagnosis Date   Arthritis    Arthrofibrosis of total knee arthroplasty (HCC) 05/30/2015   Bilateral pulmonary embolism (HCC)    a. 03/2015 CTA: acute bilat PE; b. IVC filter placed in 2017--> removed 2018.   Chest pain    a. 2015 Abnl MV;  b. 2015 Cath: nl cors.   Chronic diastolic CHF (congestive heart failure) (HCC)    a. 03/2015 Echo: EF 55-65%, poor windows, mildly dil RV.   DVT (deep venous thrombosis) (HCC)    a. 03/2015 U/S: occlusive DVT w/in the distal aspect of the Left fem vein through the L popliteal vein.   Dyspnea    On exertion   GERD (gastroesophageal reflux disease)    Headache    Hypertension    Hypertensive  heart disease    Obesity    OSA (obstructive sleep apnea)    uses CPAP nightly settings at 15    PAF (paroxysmal atrial fibrillation) (HCC)    Pneumonia    hx of years ago    Primary localized osteoarthritis of left knee    a. 11/2014 s/p L TKA.   Primary localized osteoarthritis of right knee    a. 02/2014 s/p R Partial Knee arthroplasty.   Stiffness of left knee    a. 12/2014 s/p manipulation under anesthesia.   Urine incontinence     Family History: Non contributory to the present illness  Social History: Social History   Socioeconomic History   Marital status: Married    Spouse name: Kurt Hoffmeier   Number of children: 2   Years of education: 12   Highest education level: GED or equivalent  Occupational History   Occupation: Acupuncturist: Engineer, [employer]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) [employer]  Tobacco Use   Smoking status: Never   Smokeless tobacco: Never  Vaping Use   Vaping status: Never Used  Substance and Sexual Activity   Alcohol  use: Not Currently    Comment: \   Drug use: No   Sexual activity: Yes  Other Topics Concern   Not on file  Social History Narrative   ** Merged History Encounter **       Social Drivers of Health   Financial Resource Strain: Low Risk  (08/17/2023)   Overall Financial Resource Strain (CARDIA)    Difficulty of Paying Living Expenses: Not hard at all  Food Insecurity: No Food Insecurity (08/17/2023)   Hunger Vital Sign    Worried About Running Out of Food in the Last Year: Never true    Ran Out of Food in the Last Year: Never true  Transportation Needs: Unknown (08/17/2023)   PRAPARE - Administrator, Civil Service (Medical): No    Lack of Transportation (Non-Medical): Not on file  Physical Activity: Insufficiently Active (08/17/2023)   Exercise Vital Sign    Days of Exercise per Week: 5 days    Minutes of Exercise per Session: 10 min  Stress: No Stress Concern Present (08/17/2023)   [employer] of Occupational Health - Occupational Stress Questionnaire    Feeling of Stress: Not at all  Social Connections: Unknown (08/17/2023)   Social Connection and Isolation Panel    Frequency of Communication with Friends and Family: Three times a week    Frequency of Social Gatherings with Friends and Family: Three times a week    Attends Religious Services: 1 to 4 times per year    Active Member of Clubs or Organizations: Not on file    Attends Banker Meetings: Not on file    Marital Status: Married  Intimate Partner Violence: Not At Risk (08/14/2023)   Humiliation, Afraid, Rape, and Kick questionnaire    Fear of Current or Ex-Partner: No    Emotionally Abused: No    Physically Abused: No    Sexually Abused: No    Vital Signs: There were no vitals taken for this visit. There is no height or weight on file to calculate BMI.    Examination: General Appearance: The patient is well-developed, well-nourished, and in no distress. Neck Circumference: 43  cm Skin: Gross inspection of skin unremarkable. Head: normocephalic, no gross deformities. Eyes: no gross deformities noted. ENT: ears appear grossly normal Neurologic: Alert and oriented. No involuntary movements.  STOP BANG RISK ASSESSMENT S (snore) Have you  been told that you snore?     YES/N   T (tired) Are you often tired, fatigued, or sleepy during the day?   YES/NO  O (obstruction) Do you stop breathing, choke, or gasp during sleep? YES/NO   P (pressure) Do you have or are you being treated for high blood pressure? YES/NO   B (BMI) Is your body index greater than 35 kg/m? YES/NO   A (age) Are you 65 years old or older? YES/NO   N (neck) Do you have a neck circumference greater than 16 inches?   YES/NO   G (gender) Are you a male? YES/NO   TOTAL STOP/BANG "YES" ANSWERS        A STOP-Bang score of 2 or less is considered low risk, and a score of 5 or more is high risk for having either moderate or severe OSA. For people who score 3 or 4, doctors may need to perform further assessment to determine how likely they are to have OSA.         EPWORTH SLEEPINESS SCALE:  Scale:  (0)= no chance of dozing; (1)= slight chance of dozing; (2)= moderate chance of dozing; (3)= high chance of dozing  Chance  Situtation    Sitting and reading: ***    Watching TV: ***    Sitting Inactive in public: ***    As a passenger in car: ***      Lying down to rest: ***    Sitting and talking: ***    Sitting quielty after lunch: ***    In a car, stopped in traffic: ***   TOTAL SCORE:   *** out of 24    SLEEP STUDIES:  SPLIT (10/11/11) AHI 81, low SPO2 75% , CPAP@ 17 cmH2O   CPAP COMPLIANCE DATA:  Date Range: 09/28/2022-09/27/2023  Average Daily Use: 5 hours 44 minutes  Median Use: 5 hours 49 minutes  Compliance for > 4 Hours: 83%  AHI: 1.9 respiratory events per hour  Days Used: 360/365 days  Mask Leak: 25.9  95th Percentile Pressure:  17         LABS: Recent Results (from the past 2160 hours)  CBC     Status: None   Collection Time: 08/18/23  1:24 PM  Result Value Ref Range   WBC 8.6 3.8 - 10.8 Thousand/uL   RBC 4.49 4.20 - 5.80 Million/uL   Hemoglobin 14.0 13.2 - 17.1 g/dL   HCT 58.0 61.4 - 49.9 %   MCV 93.3 80.0 - 100.0 fL   MCH 31.2 27.0 - 33.0 pg   MCHC 33.4 32.0 - 36.0 g/dL    Comment: For adults, a slight decrease in the calculated MCHC value (in the range of 30 to 32 g/dL) is most likely not clinically significant; however, it should be interpreted with caution in correlation with other red cell parameters and the patient's clinical condition.    RDW 13.2 11.0 - 15.0 %   Platelets 198 140 - 400 Thousand/uL   MPV 11.4 7.5 - 12.5 fL  Comprehensive metabolic panel with GFR     Status: Abnormal   Collection Time: 08/18/23  1:24 PM  Result Value Ref Range   Glucose, Bld 71 65 - 99 mg/dL    Comment: .            Fasting reference interval .    BUN 25 7 - 25 mg/dL   Creat 8.03 (H) 9.29 - 1.35 mg/dL   eGFR 38 (L) > OR =  60 mL/min/1.48m2   BUN/Creatinine Ratio 13 6 - 22 (calc)   Sodium 142 135 - 146 mmol/L   Potassium 4.5 3.5 - 5.3 mmol/L   Chloride 106 98 - 110 mmol/L   CO2 28 20 - 32 mmol/L   Calcium 9.5 8.6 - 10.3 mg/dL   Total Protein 6.8 6.1 - 8.1 g/dL   Albumin 4.4 3.6 - 5.1 g/dL   Globulin 2.4 1.9 - 3.7 g/dL (calc)   AG Ratio 1.8 1.0 - 2.5 (calc)   Total Bilirubin 0.4 0.2 - 1.2 mg/dL   Alkaline phosphatase (APISO) 77 35 - 144 U/L   AST 27 10 - 35 U/L   ALT 35 9 - 46 U/L  Lipid panel     Status: Abnormal   Collection Time: 08/18/23  1:24 PM  Result Value Ref Range   Cholesterol 180 <200 mg/dL   HDL 42 > OR = 40 mg/dL   Triglycerides 807 (H) <150 mg/dL   LDL Cholesterol (Calc) 107 (H) mg/dL (calc)    Comment: Reference range: <100 . Desirable range <100 mg/dL for primary prevention;   <70 mg/dL for patients with CHD or diabetic patients  with > or = 2 CHD risk  factors. SABRA LDL-C is now calculated using the Martin-Hopkins  calculation, which is a validated novel method providing  better accuracy than the Friedewald equation in the  estimation of LDL-C.  Richard APPLETHWAITE et al. SANDREA. 7986;689(80): 2061-2068  (http://education.[facility].com/faq/FAQ164)    Total CHOL/HDL Ratio 4.3 <5.0 (calc)   Non-HDL Cholesterol (Calc) 138 (H) <130 mg/dL (calc)    Comment: For patients with diabetes plus 1 major ASCVD risk  factor, treating to a non-HDL-C goal of <100 mg/dL  (LDL-C of <29 mg/dL) is considered a therapeutic  option.   Hemoglobin A1c     Status: None   Collection Time: 08/18/23  1:24 PM  Result Value Ref Range   Hgb A1c MFr Bld 5.4 <5.7 %    Comment: For the purpose of screening for the presence of diabetes: . <5.7%       Consistent with the absence of diabetes 5.7-6.4%    Consistent with increased risk for diabetes             (prediabetes) > or =6.5%  Consistent with diabetes . This assay result is consistent with a decreased risk of diabetes. . Currently, no consensus exists regarding use of hemoglobin A1c for diagnosis of diabetes in children. . According to American Diabetes Association (ADA) guidelines, hemoglobin A1c <7.0% represents optimal control in non-pregnant diabetic patients. Different metrics may apply to specific patient populations.  Standards of Medical Care in Diabetes(ADA). .    Mean Plasma Glucose 108 mg/dL   eAG (mmol/L) 6.0 mmol/L  PSA     Status: None   Collection Time: 08/18/23  1:24 PM  Result Value Ref Range   PSA 0.37 < OR = 4.00 ng/mL    Comment: The total PSA value from this assay system is  standardized against the WHO standard. The test  result will be approximately 20% lower when compared  to the equimolar-standardized total PSA (Beckman  Coulter). Comparison of serial PSA results should be  interpreted with this fact in mind. . This test was performed using the Siemens  chemiluminescent  method. Values obtained from  different assay methods cannot be used interchangeably. PSA levels, regardless of value, should not be interpreted as absolute evidence of the presence or absence of disease.   Uric acid  Status: None   Collection Time: 08/18/23  1:24 PM  Result Value Ref Range   Uric Acid, Serum 7.7 4.0 - 8.0 mg/dL    Comment: Therapeutic target for gout patients: <6.0 mg/dL .   Vitamin B12     Status: None   Collection Time: 08/18/23  1:24 PM  Result Value Ref Range   Vitamin B-12 249 200 - 1,100 pg/mL    Comment: . Please Note: Although the reference range for vitamin B12 is [phone] pg/mL, it has been reported that between 5 and 10% of patients with values between 200 and 400 pg/mL may experience neuropsychiatric and hematologic abnormalities due to occult B12 deficiency; less than 1% of patients with values above 400 pg/mL will have symptoms. .   Cologuard     Status: None   Collection Time: 08/24/23 11:00 AM  Result Value Ref Range   COLOGUARD Negative Negative    Comment: The Cologuard (TM) test was performed on this specimen.  NEGATIVE TEST RESULT. A negative Cologuard result indicates a low likelihood that a colorectal cancer (CRC) or advanced adenoma (adenomatous polyps with more advanced pre-malignant features) is present. The chance that a person with a negative Cologuard test has a colorectal cancer is less than 1 in 1500 (negative predictive value >99.9%) or has an advanced adenoma is less than 5.3% (negative predictive value 94.7%). These data are based on a prospective cross-sectional study of 10,000 individuals at average risk for colorectal cancer who were screened with both Cologuard and colonoscopy. (Imperiale T. et al, N Engl J Med 2014;370(14):1286-1297) The normal value (reference range) for this assay is negative.  COLOGUARD RE-SCREENING RECOMMENDATION: Periodic colorectal cancer screening is an important part of preventive healthcare for  asymptomatic individuals at average risk for colorectal cancer. Following a negative Cologuard  result, the American Cancer Society and U.S. Multi-Society Task Force screening guidelines recommend a Cologuard re-screening interval of 3 years.  References: American Cancer Society Guideline for Colorectal Cancer Screening: https://www.cancer.org/cancer/colon-rectal-cancer/detection-diagnosis-staging/acs-recommendations.html.; Rex DK, Boland CR, Dominitz JK, Colorectal Cancer Screening: Recommendations for Physicians and Patients from the U.S. Multi-Society Task Force on Colorectal Cancer Screening , Am J Gastroenterology 2017; 112:1016-1030.  TEST DESCRIPTION: Composite algorithmic analysis of stool DNA-biomarkers with hemoglobin immunoassay.   Quantitative values of individual biomarkers are not reportable and are not associated with individual biomarker result reference ranges. Cologuard is intended for colorectal cancer screening of adults of either sex, 45 years or older, who are at average-risk for colorectal cancer (CRC). Cologuard has been approved for use by the U.S.  FDA. The performance of Cologuard was established in a cross sectional study of average-risk adults aged 36-84. Cologuard performance in patients ages 15 to 58 years was estimated by sub-group analysis of near-age groups. Colonoscopies performed for a positive result may find as the most clinically significant lesion: colorectal cancer [4.0%], advanced adenoma (including sessile serrated polyps greater than or equal to 1cm diameter) [20%] or non- advanced adenoma [31%]; or no colorectal neoplasia [45%]. These estimates are derived from a prospective cross-sectional screening study of 10,000 individuals at average risk for colorectal cancer who were screened with both Cologuard and colonoscopy. (Imperiale T. et al, LOISE [place] J Med 2014;370(14):1286-1297.) Cologuard may produce a false negative or false positive result (no colorectal cancer or  precancerous polyp present at colonoscopy follow up). A negative Cologuard test result does not guarantee the absence of CRC or advanced adenoma  (pre-cancer). The current Cologuard screening interval is every 3 years. (American Cancer Society and U.S.  Multi-Society Task Force). Cologuard performance data in a 10,000 patient pivotal study using colonoscopy as the reference method can be accessed at the following location: www.exactlabs.com/results. Additional description of the Cologuard test process, warnings and precautions can be found at www.cologuard.com.   Renal Function Panel     Status: Abnormal   Collection Time: 09/02/23  9:52 AM  Result Value Ref Range   Glucose, Bld 87 65 - 99 mg/dL    Comment: .            Fasting reference interval .    BUN 23 7 - 25 mg/dL   Creat 8.59 (H) 9.29 - 1.35 mg/dL   BUN/Creatinine Ratio 16 6 - 22 (calc)   Sodium 140 135 - 146 mmol/L   Potassium 4.1 3.5 - 5.3 mmol/L   Chloride 106 98 - 110 mmol/L   CO2 23 20 - 32 mmol/L   Calcium 9.3 8.6 - 10.3 mg/dL   Phosphorus 3.6 2.5 - 4.5 mg/dL   Albumin 4.5 3.6 - 5.1 g/dL    Radiology: CT ANGIO HEAD NECK W WO CM Result Date: 12/12/2022 CLINICAL DATA:  Vertigo, rule out vertebrobasilar insufficiency EXAM: CT ANGIOGRAPHY HEAD AND NECK WITH AND WITHOUT CONTRAST TECHNIQUE: Multidetector CT imaging of the head and neck was performed using the standard protocol during bolus administration of intravenous contrast. Multiplanar CT image reconstructions and MIPs were obtained to evaluate the vascular anatomy. Carotid stenosis measurements (when applicable) are obtained utilizing NASCET criteria, using the distal internal carotid diameter as the denominator. RADIATION DOSE REDUCTION: This exam was performed according to the departmental dose-optimization program which includes automated exposure control, adjustment of the mA and/or kV according to patient size and/or use of iterative reconstruction technique. CONTRAST:   75mL OMNIPAQUE  IOHEXOL  350 MG/ML SOLN COMPARISON:  No prior CTA available, correlation is made with 12/10/2022 CT head and 11/20/2022 MRA head FINDINGS: CT HEAD FINDINGS Brain: No evidence of acute infarct, hemorrhage, mass, mass effect, or midline shift. No hydrocephalus or extra-axial fluid collection. Vascular: No hyperdense vessel. Skull: Negative for fracture or focal lesion. Sinuses/Orbits: Mucosal thickening in the ethmoid air cells and right maxillary sinus. No acute finding in the orbits. Other: The mastoid air cells are well aerated. CTA NECK FINDINGS Aortic arch: Standard branching. Imaged portion shows no evidence of aneurysm or dissection. No significant stenosis of the major arch vessel origins. Right carotid system: No evidence of dissection, occlusion, or hemodynamically significant stenosis (greater than 50%). Left carotid system: No evidence of dissection, occlusion, or hemodynamically significant stenosis (greater than 50%). Vertebral arteries: No evidence of dissection, occlusion, or hemodynamically significant stenosis (greater than 50%). Skeleton: No acute osseous abnormality. Degenerative changes in the cervical spine. Other neck: No acute finding. Upper chest: No focal pulmonary opacity or pleural effusion. Prominent mediastinal lymph nodes, which measure up to 1.1 cm in short axis, not enlarged by CT size criteria Review of the MIP images confirms the above findings CTA HEAD FINDINGS Anterior circulation: Both internal carotid arteries are patent to the termini, without significant stenosis. A1 segments patent. Normal anterior communicating artery. Anterior cerebral arteries are patent to their distal aspects without significant stenosis. No M1 stenosis or occlusion. MCA branches perfused to their distal aspects without significant stenosis. Posterior circulation: Vertebral arteries patent to the vertebrobasilar junction without significant stenosis. The right vertebral artery primarily  supplies the right PICA. Posterior inferior cerebellar arteries patent proximally. Basilar patent to its distal aspect without significant stenosis. Superior cerebellar arteries patent proximally. The P1 segments  are not definitively visualized, but there may be very hypoplastic P1 segments. Fetal origin of the bilateral PCAs from the posterior communicating arteries. PCAs perfused to their distal aspects without significant stenosis. Venous sinuses: As permitted by contrast timing, patent. Anatomic variants: Fetal origin of the bilateral PCAs. No evidence of aneurysm or vascular malformation. Review of the MIP images confirms the above findings IMPRESSION: 1. No acute intracranial process. 2. No intracranial large vessel occlusion or significant stenosis. 3. No hemodynamically significant stenosis in the neck. Electronically Signed   By: Donald Campion M.D.   On: 12/12/2022 03:08   MR BRAIN W CONTRAST Result Date: 12/12/2022 CLINICAL DATA:  Blurred vision, dizziness, TIA EXAM: MRI HEAD WITH CONTRAST TECHNIQUE: Multiplanar, multiecho pulse sequences of the brain and surrounding structures were obtained with intravenous contrast. CONTRAST:  10mL GADAVIST  GADOBUTROL  1 MMOL/ML IV SOLN COMPARISON:  12/10/2022 MRI head without contrast FINDINGS: As the patient recently had a non-contrast MRI of the brain, only T1 axial precontrast and axial, coronal, and sagittal postcontrast sequences were obtained. No abnormal parenchymal or meningeal enhancement. Normal arterial and venous enhancement. The exam is otherwise unchanged compared to the 12/10/2022 exam. IMPRESSION: No abnormal parenchymal or meningeal enhancement. Electronically Signed   By: Donald Campion M.D.   On: 12/12/2022 03:00   ECHOCARDIOGRAM COMPLETE Result Date: 12/11/2022    ECHOCARDIOGRAM REPORT   Patient Name:   ALANTE TOLAN Date of Exam: 12/11/2022 Medical Rec #:  [id number]        Height:       69.0 in Accession #:    [id number]       Weight:        244.9 lb Date of Birth:  1960-06-22        BSA:          2.252 m Patient Age:    62 years         BP:           102/52 mmHg Patient Gender: M                HR:           50 bpm. Exam Location:  [facility] Procedure: 2D Echo, Cardiac Doppler, Color Doppler and Saline Contrast Bubble            Study Indications:     TIA G45.9  History:         Patient has prior history of Echocardiogram examinations, most                  recent 03/28/2015. Signs/Symptoms:Chest Pain; Risk                  Factors:Hypertension.  Sonographer:     Christopher Furnace Referring Phys:  [id number] DRUE ONEIDA POTTER Diagnosing Phys: Evalene Lunger MD IMPRESSIONS  1. Left ventricular ejection fraction, by estimation, is 60 to 65%. The left ventricle has normal function. The left ventricle has no regional wall motion abnormalities. Left ventricular diastolic parameters are consistent with Grade I diastolic dysfunction (impaired relaxation).  2. Right ventricular systolic function is normal. The right ventricular size is normal. There is normal pulmonary artery systolic pressure. The estimated right ventricular systolic pressure is 18.1 mmHg.  3. The mitral valve is normal in structure. No evidence of mitral valve regurgitation. No evidence of mitral stenosis.  4. The aortic valve has an indeterminant number of cusps. Aortic valve regurgitation is mild. No aortic stenosis is present.  5. The inferior  vena cava is normal in size with greater than 50% respiratory variability, suggesting right atrial pressure of 3 mmHg.  6. Agitated saline contrast bubble study was negative, with no evidence of any interatrial shunt. FINDINGS  Left Ventricle: Left ventricular ejection fraction, by estimation, is 60 to 65%. The left ventricle has normal function. The left ventricle has no regional wall motion abnormalities. The left ventricular internal cavity size was normal in size. There is  no left ventricular hypertrophy. Left ventricular diastolic parameters are consistent with  Grade I diastolic dysfunction (impaired relaxation). Right Ventricle: The right ventricular size is normal. No increase in right ventricular wall thickness. Right ventricular systolic function is normal. There is normal pulmonary artery systolic pressure. The tricuspid regurgitant velocity is 1.81 m/s, and  with an assumed right atrial pressure of 5 mmHg, the estimated right ventricular systolic pressure is 18.1 mmHg. Left Atrium: Left atrial size was normal in size. Right Atrium: Right atrial size was normal in size. Pericardium: There is no evidence of pericardial effusion. Mitral Valve: The mitral valve is normal in structure. No evidence of mitral valve regurgitation. No evidence of mitral valve stenosis. MV peak gradient, 2.4 mmHg. The mean mitral valve gradient is 1.0 mmHg. Tricuspid Valve: The tricuspid valve is normal in structure. Tricuspid valve regurgitation is not demonstrated. No evidence of tricuspid stenosis. Aortic Valve: The aortic valve has an indeterminant number of cusps. Aortic valve regurgitation is mild. No aortic stenosis is present. Aortic valve mean gradient measures 2.5 mmHg. Aortic valve peak gradient measures 3.9 mmHg. Aortic valve area, by VTI measures 5.08 cm. Pulmonic Valve: The pulmonic valve was normal in structure. Pulmonic valve regurgitation is not visualized. No evidence of pulmonic stenosis. Aorta: The aortic root is normal in size and structure. Venous: The inferior vena cava is normal in size with greater than 50% respiratory variability, suggesting right atrial pressure of 3 mmHg. IAS/Shunts: No atrial level shunt detected by color flow Doppler. Agitated saline contrast was given intravenously to evaluate for intracardiac shunting. Agitated saline contrast bubble study was negative, with no evidence of any interatrial shunt. There  is no evidence of a patent foramen ovale. There is no evidence of an atrial septal defect.  LEFT VENTRICLE PLAX 2D LVIDd:         5.00 cm    Diastology LVIDs:         3.30 cm   LV e' medial:    6.42 cm/s LV PW:         1.10 cm   LV E/e' medial:  10.0 LV IVS:        1.20 cm   LV e' lateral:   10.20 cm/s LVOT diam:     2.20 cm   LV E/e' lateral: 6.3 LV SV:         91 LV SV Index:   41 LVOT Area:     3.80 cm  RIGHT VENTRICLE RV Basal diam:  3.50 cm RV Mid diam:    3.80 cm RV S prime:     14.10 cm/s TAPSE (M-mode): 2.7 cm LEFT ATRIUM             Index        RIGHT ATRIUM           Index LA diam:        3.30 cm 1.47 cm/m   RA Area:     18.40 cm LA Vol (A2C):   60.5 ml 26.87 ml/m  RA Volume:  50.80 ml  22.56 ml/m LA Vol (A4C):   46.1 ml 20.47 ml/m LA Biplane Vol: 54.0 ml 23.98 ml/m  AORTIC VALVE AV Area (Vmax):    3.85 cm AV Area (Vmean):   3.89 cm AV Area (VTI):     5.08 cm AV Vmax:           98.60 cm/s AV Vmean:          67.300 cm/s AV VTI:            0.180 m AV Peak Grad:      3.9 mmHg AV Mean Grad:      2.5 mmHg LVOT Vmax:         99.80 cm/s LVOT Vmean:        68.900 cm/s LVOT VTI:          0.240 m LVOT/AV VTI ratio: 1.34  AORTA Ao Root diam: 3.30 cm MITRAL VALVE               TRICUSPID VALVE MV Area (PHT): 2.88 cm    TR Peak grad:   13.1 mmHg MV Area VTI:   3.66 cm    TR Vmax:        181.00 cm/s MV Peak grad:  2.4 mmHg MV Mean grad:  1.0 mmHg    SHUNTS MV Vmax:       0.78 m/s    Systemic VTI:  0.24 m MV Vmean:      51.8 cm/s   Systemic Diam: 2.20 cm MV Decel Time: 263 msec MV E velocity: 64.50 cm/s MV A velocity: 79.10 cm/s MV E/A ratio:  0.82 Evalene Lunger MD Electronically signed by Evalene Lunger MD Signature Date/Time: 12/11/2022/3:33:22 PM    Final     No results found.  No results found.    Assessment and Plan: Patient Active Problem List   Diagnosis Date Noted   TIA (transient ischemic attack) 12/10/2022   History of DVT (deep vein thrombosis) 11/09/2021   Gout 11/09/2021   Class 2 obesity due to excess calories with body mass index (BMI) of 36.0 to 36.9 in adult 05/07/2021   OA (osteoarthritis) 06/23/2018   History  of pulmonary embolus (PE) 06/23/2018   CHF (congestive heart failure), NYHA class II, chronic, diastolic (HCC) 06/23/2018   PAF (paroxysmal atrial fibrillation) (HCC) 03/30/2015   Hypertension 08/17/2011   GERD (gastroesophageal reflux disease) 08/17/2011   OSA (obstructive sleep apnea) 08/16/2011      The patient *** tolerate PAP and reports *** benefit from PAP use. The patient was reminded how to *** and advised to ***. The patient was also counselled on ***. The compliance is ***. The AHI is ***.   ***  General Counseling: I have discussed the findings of the evaluation and examination with Ozell.  I have also discussed any further diagnostic evaluation thatmay be needed or ordered today. Venus verbalizes understanding of the findings of todays visit. We also reviewed his medications today and discussed drug interactions and side effects including but not limited excessive drowsiness and altered mental states. We also discussed that there is always a risk not just to him but also people around him. he has been encouraged to call the office with any questions or concerns that should arise related to todays visit.  No orders of the defined types were placed in this encounter.       I have personally obtained a history, examined the patient, evaluated laboratory and imaging results, formulated the assessment and plan  and placed orders.  Elfreda DELENA Bathe, MD [facility] Diplomate ABMS Pulmonary Critical Care Medicine and Sleep Medicine

## 2023-09-29 ENCOUNTER — Ambulatory Visit

## 2023-10-13 ENCOUNTER — Telehealth: Payer: Self-pay

## 2023-10-13 NOTE — Telephone Encounter (Signed)
 Copied from CRM [id number]. Topic: Clinical - Medication Question >> Oct 13, 2023 11:08 AM Avram MATSU wrote: Reason for CRM: patient had a visit on 8/29 and was wondering when can he take his next b12 shot. He stated he does it on his own now. Mychart response only.

## 2023-10-22 ENCOUNTER — Other Ambulatory Visit: Payer: Self-pay | Admitting: Internal Medicine

## 2023-10-24 NOTE — Telephone Encounter (Signed)
 Requested Prescriptions  Pending Prescriptions Disp Refills   baclofen  (LIORESAL ) 10 MG tablet [Pharmacy Med Name: BACLOFEN  10MG  TABLET] 180 tablet 1    Sig: TAKE ONE TABLET (10 MG TOTAL) BY MOUTH TWO TIMES DAILY.     Analgesics:  Muscle Relaxants - baclofen  Failed - 10/24/2023  9:35 AM      Failed - Cr in normal range and within 180 days    Creat  Date Value Ref Range Status  09/02/2023 1.40 (H) 0.70 - 1.35 mg/dL Final         Passed - eGFR is 30 or above and within 180 days    GFR calc Af Amer  Date Value Ref Range Status  10/19/2019 >60 >60 mL/min Final  10/19/2019 >60 >60 mL/min Final   GFR, Estimated  Date Value Ref Range Status  12/12/2022 58 (L) >60 mL/min Final    Comment:    (NOTE) Calculated using the CKD-EPI Creatinine Equation (2021)    GFR  Date Value Ref Range Status  01/17/2020 60.38 >60.00 mL/min Final    Comment:    Calculated using the CKD-EPI Creatinine Equation (2021)   eGFR  Date Value Ref Range Status  08/18/2023 38 (L) > OR = 60 mL/min/1.31m2 Final  02/05/2021 73 >59 mL/min/1.73 Final         Passed - Valid encounter within last 6 months    Recent Outpatient Visits           1 month ago B12 deficiency   [place] [facility] [place], Angeline ORN, NP   2 months ago Encounter for general adult medical examination with abnormal findings   [place] [facility] [place], Angeline ORN, NP   2 months ago Orthostatic hypotension   [place] [facility] [place], Angeline ORN, NP   3 months ago Cellulitis of right upper extremity   [place] [facility] [place], Angeline ORN, NP   5 months ago Primary hypertension   [place] [facility] [place], Angeline ORN, [place]

## 2023-11-05 NOTE — Progress Notes (Signed)
 Today the history is gathered from: 100% - patient  0% - alone today  RECORDS SUMMARY: Patient referred for diplopia, nystagmus, and vertigo.  REFERRING PHYSICIAN: Antonette Corrigan, NP PRIMARY CARE PHYSICIAN:  Antonette Corrigan, NP   IMPRESSION/PLAN  Richard [name] is a 63 y.o. male presenting for evaluation of  NYSTAGMUS/ VERTIGO/ DIZZINESS/ DIPLOPIA/ HEARING DIFFICULTY/ HEADACHE/ IMBALANCE/ OSA -Unchanged. -Patient with unchanged episdoes of dizziness, vertigo, imbalance, blurry vision. Has had around 3-4 spells in the past month. Taking Emgality monthly injections.  - Reviewed recent eGFR readings from 12/12/2022 and 08/18/2023 in the office today. eGFR from 12/12/2022 was 58 and eGFR on 08/18/2023 38.  -Symptoms may be consistent with cardiac etiology given previous heat rate fluctuations, as well as sporadic spells that can occur with and without exertion.  -Recommend to follow up with Dr. Darron at [facility] for evaluation of atrial fibrillation with RVR or tachy brady syndrome/ sick sinus syndrome. Would like to see if patient needs electrophys. Will place referral to [facility] as well.   -Referral to vestibular PT for episodes of vertigo, imbalance, and vision changes.  -Referral to Nephrologist to manage rapidly decreasing eGFR level. Currently CKD stage 3b. Most recent GFR from July 2025 was 38. [facility], [phone] -Continue taking Emgality monthly injections.  -Could consider restarting Metoprolol  12.5 mg BID in the future if symptoms do not improve.   Medications previously tried: Nortriptyline 20 mg (dry mouth, drunk sensation) Effexor  XR 37.5-75 mg (drowsiness) Topamax  50 QHS and 25 mg BID (ineffective for headaches)  Follow-up with Dr. Lane in 3 months.  p=4   CHIEF COMPLAINT & HPI  Richard [name] is a 63 y.o. male presenting for evaluation of: Chief Complaint  Patient presents with  . NYSTAGMUS/ VERTIGO/ DIZZINESS  . DIPLOPIA/ HEARING  DIFFICULTY  . HEADACHE/ IMBALANCE/ OSA/    NYSTAGMUS/ VERTIGO/ DIZZINESS/ DIPLOPIA/ HEARING DIFFICULTY/ HEADACHE/ IMBALANCE/ OSA/ Patient with unchanged episdoes of dizziness, vertigo, imbalance, blurry vision. Has had around 3-4 spells in the past month. Episodes last for 3-4 minutes on average are preceded by rapid eye movement. No known triggers. Can occur at rest or with movement. Most recent episodes patient was lying or sitting down.  No nausea and vomiting with episodes. Reports that headaches occur very seldomly. Sleep is good, uses CPAP machine. Mood is good. Taking Emgality monthly injections. Has not been able to schedule with vestibular PT.    DATA SUMMARY: 02/16/2023 ROUTINE EEG IMPRESSION: This routine EEG in the awake and asleep states is within normal limits.  12/11/2022 CTA HEAD NECK W WO IMPRESSION:  1. No acute intracranial process.  2. No intracranial large vessel occlusion or significant stenosis.  3. No hemodynamically significant stenosis in the neck.   12/11/2022 MR BRAIN W CONTRAST IMPRESSION:  No abnormal parenchymal or meningeal enhancement.   12/11/2022 ECHO IMPRESSION:  1. Left ventricular ejection fraction, by estimation, is 60 to 65%. The left ventricle has normal function. The left ventricle has no regional wall motion abnormalities. Left ventricular diastolic parameters are consistent with Grade I diastolic  dysfunction (impaired relaxation).   2. Right ventricular systolic function is normal. The right ventricular size is normal. There is normal pulmonary artery systolic pressure. The estimated right ventricular systolic pressure is 18.1 mmHg.   3. The mitral valve is normal in structure. No evidence of mitral valve regurgitation. No evidence of mitral stenosis.   4. The aortic valve has an indeterminant number of cusps. Aortic valve regurgitation is mild. No  aortic stenosis is present.   5. The inferior vena cava is normal in size with greater than  50% respiratory variability, suggesting right atrial pressure of 3 mmHg.   6. Agitated saline contrast bubble study was negative, with no evidence of any interatrial shunt.   12/10/2022 MR BRAIN WO CONTRAST IMPRESSION:  No acute intracranial process. No evidence of acute or subacute  infarct.   12/10/2022 US  CAROTID BILATERAL IMPRESSION:  No evidence of focal carotid stenosis.   12/10/2022 CT HEAD WO CONTRAST IMPRESSION:  1. No acute intracranial abnormality.  2. Mild mucosal disease in the ethmoid air cells and left sphenoid  sinus.   11/20/2022 MRA HEAD WO CONTRAST IMPRESSION:  Normal intracranial MR angiography of the large and medium size  vessels. Fetal origin of both posterior cerebral arteries from the  anterior circulation, a common normal variant. No vascular  abnormality seen to explain the clinical presentation.   06/27/2022 MR BRAIN W WO CONTRAST IMPRESSION:  No acute intracranial process. No etiology is seen for the patient's  symptoms.   VISIT SUMMARIES:   MEDICATIONS Current Outpatient Medications  Medication Sig Dispense Refill  . allopurinoL  (ZYLOPRIM ) 100 MG tablet Take 100 mg by mouth once daily    . apixaban  (ELIQUIS ) 5 mg tablet Take 5 mg by mouth every 12 (twelve) hours.    . baclofen  (LIORESAL ) 10 MG tablet Take by mouth.    . colchicine  (COLCRYS ) 0.6 mg tablet Take 0.6 mg by mouth 2 (two) times daily    . esomeprazole (NEXIUM) 40 MG DR capsule Take by mouth    . flecainide  (TAMBOCOR ) 100 MG tablet Take 100 mg by mouth 2 (two) times daily    . galcanezumab-gnlm 120 mg/mL PnIj Inject 120 mg subcutaneously monthly 1 mL 3  . pantoprazole  (PROTONIX ) 40 MG DR tablet Take 40 mg by mouth once daily    . traMADol  (ULTRAM ) 50 mg tablet Take 50 mg by mouth every 6 (six) hours as needed for Pain    . amitriptyline  (ELAVIL ) 25 MG tablet Take 1-3 tabs at night as needed for sleep (Patient not taking: Reported on 11/05/2023) 90 tablet 1  . flecainide   (TAMBOCOR ) 150 MG tablet 150 mg 2 (two) times daily (Patient not taking: Reported on 01/07/2023)  3  . FUROsemide  (LASIX ) 20 MG tablet Take 20 mg by mouth once daily as needed (Patient not taking: Reported on 11/05/2023)    . metoprolol  succinate (TOPROL -XL) 25 MG XL tablet Take 25 mg by mouth 2 (two) times daily (Patient not taking: Reported on 11/05/2023)    . nortriptyline (PAMELOR) 10 MG capsule TAKE ONE CAPSULE BY MOUTH NIGHTLY FOR ONE WEEK, THEN INCREASE TO TWO CAPSULES NIGHTLY. PLEASE MAKE SURE TO KEEP CARDIO APPT FOR EKG. (Patient not taking: Reported on 06/09/2023) 60 capsule 1  . nortriptyline (PAMELOR) 10 MG capsule Take 20 mg nightly (Patient not taking: Reported on 11/05/2023) 60 capsule 0  . oxyCODONE  (ROXICODONE ) 15 MG immediate release tablet Take 15 mg by mouth every 4 (four) hours as needed for Pain. (Patient not taking: Reported on 11/05/2023)    . topiramate  (TOPAMAX ) 25 MG tablet Take 25 mg by mouth 2 (two) times daily (Patient not taking: Reported on 11/05/2023)    . venlafaxine  (EFFEXOR ) 37.5 MG tablet TAKE ONE TABLET (37.5 MG TOTAL) BY MOUTH AT BEDTIME (Patient not taking: Reported on 11/05/2023) 30 tablet 1  . venlafaxine  (EFFEXOR -XR) 75 MG XR capsule Take 1 capsule (75 mg total) by mouth at bedtime (Patient not  taking: Reported on 11/05/2023) 30 capsule 1   No current facility-administered medications for this visit.    ALLERGIES Allergies  Allergen Reactions  . Penicillin Unknown    Patient experienced a reaction as a young child and doesn't remember the symptoms.   . Vancomycin  Vancomycin  Infusion Reaction     EXAM   Vitals:   11/05/23 1459  BP: 138/76  Weight: (!) 115.2 kg (254 lb)  Height: 170.2 cm (5' 7)  PainSc: 0-No pain      Body mass index is 39.78 kg/m.  GENERAL: Pleasant male, in no acute distress.  Normocephalic and atraumatic.  MUSCULOSKELETAL: Bulk - Normal Tone - Normal Pronator Drift - Absent bilaterally. Ambulation - Gait and  station are steady. Romberg - mildly positive  R/L 5/5    Shoulder abduction (deltoid/supraspinatus, axillary/suprascapular n, C5) 5/5    Elbow flexion (biceps brachii, musculoskeletal n, C5-6) 5/5    Elbow extension (triceps, radial n, C7) 5/5    Finger adduction (interossei, ulnar n, T1)  5/5    Hip flexion (iliopsoas, L1/L2) 5/5    Knee flexion (hamstrings, sciatic n, L5/S1)  5/5    Knee extension (quadriceps, femoral n, L3/4) 5/5    Ankle dorsiflexion (tibialis anterior, deep fibular n, L4/5) 5/5    Ankle plantarflexion (gastroc, tibial n, S1)   NEUROLOGICAL: MENTAL STATUS: Patient is oriented to person, place and time.  Recent memory is intact.  Remote memory is intact.  Attention span and concentration are intact.  Naming, repetition, comprehension and expressive speech are within normal limits.  Patient's fund of knowledge is within normal limits for educational level.  CRANIAL NERVES: Normal    CN II (normal visual acuity and visual fields) Normal    CN III, IV, VI (extraocular muscles are intact) Normal    CN V (facial sensation is intact bilaterally) Normal    CN VII (facial strength is intact bilaterally) Normal    CN VIII (hearing is intact bilaterally) Normal    CN IX/X (palate elevates midline, normal phonation) Normal    CN XI (shoulder shrug strength is normal and symmetric) Normal    CN XII (tongue protrudes midline)  SENSATION: Intact to pain and temp bilaterally (spinothalamic tracts) Intact to position and vibration bilaterally (dorsal columns)  REFLEXES: R/L 2+/2+    Biceps 2+/2+    Brachioradialis  2+/2+    Patellar 2+/2+    Achilles  COORDINATION/CEREBELLAR: Finger to nose testing is within normal limits.     PAST MEDICAL HISTORY Past Medical History:  Diagnosis Date  . Arrhythmia   . Arthritis   . CHF (congestive heart failure) (CMS/HHS-HCC)   . GERD (gastroesophageal reflux disease)   . Hypertension   . Pulmonary embolism (CMS/HHS-HCC)    . Sleep apnea     PAST SURGICAL HISTORY Past Surgical History:  Procedure Laterality Date  . REVISION ARTHROPLASTY TOTAL KNEE Left 02/08/2016  . LAPAROSCOPIC SLEEVE GASTRECTOMY N/A 10/26/2019  . JOINT REPLACEMENT      FAMILY HISTORY Family History  Problem Relation Name Age of Onset  . Cancer Mother    . Cancer Father      SOCIAL HISTORY  Social History   Tobacco Use  . Smoking status: Never  . Smokeless tobacco: Never  Vaping Use  . Vaping status: Never Used  Substance Use Topics  . Alcohol use: No  . Drug use: No     REVIEW OF SYSTEMS:  13 system ROS form was given to the patient to complete and  I have reviewed it.  The form was sent for scan to the patient's EHR.  Pertinent positives and negatives are mentioned above in the HPI and all other systems are negative.   DATA  I have personally reviewed all of the data outlined below both prior to the appointment and during the appointment with the patient as appropriate.  No visits with results within 6 Month(s) from this visit.  Latest known visit with results is:  No results found for any previous visit.      No follow-ups on file.  Payor: [employer] MEDICARE ADVANTAGE PLAN / Plan: [facility] HMO POS AARP MEDICARE ADVANTAGE / Product Type: HMO /   This note is partially written by Roe Mater, in the presence of and acting as the scribe of Dr. Arthea Farrow.    I have reviewed, edited and added to the note as needed to reflect my best personal medical judgment.    Dr. Arthea Farrow, MD [facility] [place], [place] Ph:  [phone] Fax:  [phone]

## 2023-11-06 NOTE — Progress Notes (Signed)
 Cardiology Clinic Note   Date: 11/07/2023 ID: Richard [name], DOB [birth year], MRN [id number]  Primary Cardiologist:  Deatrice Cage, MD  Chief Complaint   Richard [name] is a 63 y.o. male who presents to the clinic today for cardiac evaluation at the request of neurology.   Patient Profile   Richard [name] is followed by Dr. Cage for the history outlined below.      Past medical history significant for: Chronic HFpEF. Echo 12/11/2022: EF 60 to 65%.  No RWMA.  Grade I DD.  Normal RV size/function.  Normal PA pressure, RVSP 18.1 mmHg.  Mild AI.  Negative bubble study with no evidence of interatrial shunt. PAF. Hypertension. TIA. OSA. GERD. PE/DVT. CKD. Obesity. S/p gastric sleeve October 2021.  In summary, patient has a history of abnormal stress test in 2015 which led to a cardiac catheterization showing normal coronary arteries.  He underwent right partial knee arthroplasty in February 2016 followed by a left total knee arthroplasty in November 2016.  His mobility was limited and he had the flu in February 2017.  In early March 2017 he presented with bilateral PEs.  Lower extremity ultrasound demonstrated DVT.  Echo at that time showed normal LV function with mildly dilated right ventricle.  He was found to be in A-fib and was treated with short-term amiodarone .  He underwent catheter-based intervention for his pulmonary embolism as well as IVC filter placement.  Amiodarone  was discontinued in June 2017 and upon follow-up in September 2017 he had not had any further arrhythmias.  It was recommended he continue lifelong anticoagulation given his PE burden as well as continued immobility.  From an A-fib standpoint he has been maintained on flecainide  and Eliquis .  He had recurrent A-fib in December 2018 for which she was seen in the ER and treated with IV metoprolol .  He underwent gastric sleeve in October 2021 and subsequently lost 80 pounds which improved his overall health and  activity tolerance.  In 2022 he had issues with bradycardia with heart rate in the high 40s and mild dizziness.  Metoprolol  was discontinued.  In November 2024 he was hospitalized for blurred vision.  CTA of the neck showed no significant obstructive disease.  MRI of the brain with no abnormalities.  He was referred to ENT and neurology.  Echo at that time demonstrated normal LV/RV function.  Neurology suspected he was having ocular migraines.  In April 2025 he was noted to have elevated blood pressure and was started on olmesartan .  Patient was last seen in the office by Dr. Cage on 07/15/2023 for routine follow-up.  She complained of orthostatic dizziness and was found to be mildly hypotensive at 90/70.  He was instructed to take half tablet of olmesartan  daily.     History of Present Illness    Today, patient is here alone. He is unsure why he was sent here. Patient denies shortness of breath, dyspnea on exertion, lower extremity edema, orthopnea or PND. He has not needed prn Lasix  for several years. No chest pain, pressure, or tightness. No palpitations. He states in the past he knows when he is in afib because he becomes nervous and jittery. He continues to have episodes of nystagmus and blurred vision but no headache. He states neurology wanted him to be seen by nephrology for declining kidney function but when he called to schedule a visit he was told referral was never placed. He is unsure what medications he is taking. He does not think  he is taking olmesartan .     ROS: All other systems reviewed and are otherwise negative except as noted in History of Present Illness.  EKGs/Labs Reviewed    EKG Interpretation Date/Time:  Friday November 07 2023 08:25:39 EDT Ventricular Rate:  54 PR Interval:  216 QRS Duration:  118 QT Interval:  456 QTC Calculation: 432 R Axis:   -55  Text Interpretation: Sinus bradycardia with 1st degree A-V block Left anterior fascicular block When compared with  ECG of 15-Jul-2023 16:03, No significant change was found Confirmed by Loistine Sober [phone]) on 11/07/2023 8:29:33 AM   08/18/2023: ALT 35; AST 27 09/02/2023: BUN 23; Creat 1.40; Potassium 4.1; Sodium 140   08/18/2023: Hemoglobin 14.0; WBC 8.6    Risk Assessment/Calculations     CHA2DS2-VASc Score = 4   This indicates a 4.8% annual risk of stroke. The patient's score is based upon: CHF History: 1 HTN History: 1 Diabetes History: 0 Stroke History: 2 Vascular Disease History: 0 Age Score: 0 Gender Score: 0             Physical Exam    VS:  BP 126/70 (BP Location: Left Arm, Patient Position: Sitting, Cuff Size: Normal)   Pulse (!) 54   Ht 5' 9 (1.753 m)   Wt 254 lb (115.2 kg)   SpO2 96%   BMI 37.51 kg/m  , BMI Body mass index is 37.51 kg/m.  GEN: Well nourished, well developed, in no acute distress. Neck: No JVD or carotid bruits. Cardiac:  RRR.  No murmur. No rubs or gallops.   Respiratory:  Respirations regular and unlabored. Clear to auscultation without rales, wheezing or rhonchi. GI: Soft, nontender, nondistended. Extremities: Radials/DP/PT 2+ and equal bilaterally. No clubbing or cyanosis. No edema.  Skin: Warm and dry, no rash. Neuro: Strength intact.  Assessment & Plan   Chronic HFpEF Echo November 2024 demonstrated normal LV/RV function, Grade I DD, normal PA pressure, mild AI, negative bubble study.  Patient denies lower extremity edema, orthopnea or PND. He has not needed Lasix  in several years.  Euvolemic and well compensated on exam. - Continue Lasix  as needed.   PAF Onset 2017.  Denies spontaneous bleeding concerns.  Patient reports cardiac awareness of afib in the past. He denies palpitations or other indicators of arrhythmia. No presyncope or syncope. EKG demonstrates sinus bradycardia with 1st degree AV block and LAFB unchanged from previous. RRR on exam today.  - Continue flecainide  and Eliquis . - 7 day Zio.   Hypertension BP today 126/70.  He is being followed by neurology for ocular migraines. He denies headaches but is experiencing episodes of nystagmus and blurred vision.  - Does not think he is taking olmesartan . Continue to monitor.   CKD Patient recently found to have declining kidney function. He has not had labs since August. He tried to contact nephrology to make an appointment but they have not received a referral. Will get a BMP today. If kidney function remains low will place referral to nephrology.  - BMP today.   Disposition: BMP today. 7 day Zio. Return in 3 months or sooner as needed.          Signed, Sober HERO. Betta Balla, DNP, NP-C

## 2023-11-07 ENCOUNTER — Ambulatory Visit: Attending: Student | Admitting: Student

## 2023-11-07 ENCOUNTER — Ambulatory Visit: Admitting: Internal Medicine

## 2023-11-07 ENCOUNTER — Ambulatory Visit

## 2023-11-07 ENCOUNTER — Other Ambulatory Visit: Payer: Self-pay | Admitting: Internal Medicine

## 2023-11-07 ENCOUNTER — Encounter: Payer: Self-pay | Admitting: Student

## 2023-11-07 ENCOUNTER — Telehealth: Payer: Self-pay | Admitting: Cardiovascular Disease

## 2023-11-07 VITALS — BP 126/70 | HR 54 | Ht 69.0 in | Wt 254.0 lb

## 2023-11-07 DIAGNOSIS — I480 Paroxysmal atrial fibrillation: Secondary | ICD-10-CM

## 2023-11-07 DIAGNOSIS — Z79899 Other long term (current) drug therapy: Secondary | ICD-10-CM | POA: Diagnosis not present

## 2023-11-07 DIAGNOSIS — I10 Essential (primary) hypertension: Secondary | ICD-10-CM

## 2023-11-07 DIAGNOSIS — N1832 Chronic kidney disease, stage 3b: Secondary | ICD-10-CM

## 2023-11-07 DIAGNOSIS — I5032 Chronic diastolic (congestive) heart failure: Secondary | ICD-10-CM

## 2023-11-07 NOTE — Patient Instructions (Signed)
 Medication Instructions:   Your physician recommends that you continue on your current medications as directed. Please refer to the Current Medication list given to you today.    *If you need a refill on your cardiac medications before your next appointment, please call your pharmacy*  Lab Work:  Your provider would like for you to have following labs drawn today BMet.    If you have labs (blood work) drawn today and your tests are completely normal, you will receive your results only by:  MyChart Message (if you have MyChart) OR  A paper copy in the mail If you have any lab test that is abnormal or we need to change your treatment, we will call you to review the results.  Testing/Procedures:  GEOFFRY HEWS- Long Term Monitor Instructions  Your physician has requested you wear a ZIO patch monitor for 7 days.  This is a single patch monitor. [employer] supplies one patch monitor per enrollment. Additional stickers are not available. Please do not apply patch if you will be having a Nuclear Stress Test, Echocardiogram, Cardiac CT, MRI, or Chest Xray during the period you would be wearing the monitor. The patch cannot be worn during these tests. You cannot remove and re-apply the ZIO XT patch monitor.  Your ZIO patch monitor will be mailed 3 day USPS to your address on file. It may take 3-5 days to receive your monitor after you have been enrolled. Once you have received your monitor, please review the enclosed instructions. Your monitor has already been registered assigning a specific monitor serial number to you.  Billing and Patient Assistance Program Information  We have supplied [employer] with any of your insurance information on file for billing purposes.  [employer] offers a sliding scale Patient Assistance Program for patients that do not have insurance, or whose insurance does not completely cover the cost of the ZIO monitor.  You must apply for the Patient Assistance Program to qualify for this  discounted rate.  To apply, please call [employer] at [phone], select option 4, select option 2, ask to apply for Patient Assistance Program. Meredeth will ask your household income, and how many people are in your household. They will quote your out-of-pocket cost based on that information. [employer] will also be able to set up a 34-month, interest-free payment plan if needed.  Applying the monitor   Shave hair from upper left chest.  Hold abrader disc by orange tab. Rub abrader in 40 strokes over the upper left chest as indicated in your monitor instructions.  Clean area with 4 enclosed alcohol pads. Let dry.  Apply patch as indicated in monitor instructions. Patch will be placed under collarbone on left side of chest with arrow pointing upward.  Rub patch adhesive wings for 2 minutes. Remove white label marked 1. Remove the white label marked 2. Rub patch adhesive wings for 2 additional minutes.  While looking in a mirror, press and release button in center of patch. A small green light will flash 3-4 times. This will be your only indicator that the monitor has been turned on.  Do not shower for the first 24 hours. You may shower after the first 24 hours.  Press the button if you feel a symptom. You will hear a small click. Record Date, Time and Symptom in the Patient Logbook.  When you are ready to remove the patch, follow instructions on the last 2 pages of Patient Logbook.  Stick patch monitor into the tabs at the bottom  of the return box.  Place Patient Logbook in the blue and white box. Use locking tab on box and tape box closed securely. The blue and white box has prepaid postage on it. Please place it in the mailbox as soon as possible. Your physician should have your test results approximately 7-14 days after the monitor has been mailed back to [facility].  Call [facility] Customer Care at [phone] if you have questions regarding your ZIO XT patch monitor.  Call them  immediately if you see an orange light blinking on your monitor.  If your monitor falls off in less than 4 days, contact our Monitor department at [phone].  If your monitor becomes loose or falls off after 4 days call [employer] at [phone] for suggestions on securing your monitor.   Referrals:  None ordered at this time   Follow-Up:  At [facility], you and your health needs are our priority.  As part of our continuing mission to provide you with exceptional heart care, our providers are all part of one team.  This team includes your primary Cardiologist (physician) and Advanced Practice Providers or APPs (Physician Assistants and Nurse Practitioners) who all work together to provide you with the care you need, when you need it.  Your next appointment:   3 month(s)  Provider:    Deatrice Cage, MD or Barnie Hila, NP    We recommend signing up for the patient portal called MyChart.  Sign up information is provided on this After Visit Summary.  MyChart is used to connect with patients for Virtual Visits (Telemedicine).  Patients are able to view lab/test results, encounter notes, upcoming appointments, etc.  Non-urgent messages can be sent to your provider as well.   To learn more about what you can do with MyChart, go to [email or web address].

## 2023-11-07 NOTE — Progress Notes (Deleted)
 Subjective:    Patient ID: Richard [name], male    DOB: [birth year], 63 y.o.   MRN: [id number]  HPI    Review of Systems     Past Medical History:  Diagnosis Date   Arthritis    Arthrofibrosis of total knee arthroplasty 05/30/2015   Bilateral pulmonary embolism (HCC)    a. 03/2015 CTA: acute bilat PE; b. IVC filter placed in 2017--> removed 2018.   Chest pain    a. 2015 Abnl MV;  b. 2015 Cath: nl cors.   Chronic diastolic CHF (congestive heart failure) (HCC)    a. 03/2015 Echo: EF 55-65%, poor windows, mildly dil RV.   DVT (deep venous thrombosis) (HCC)    a. 03/2015 U/S: occlusive DVT w/in the distal aspect of the Left fem vein through the L popliteal vein.   Dyspnea    On exertion   GERD (gastroesophageal reflux disease)    Headache    Hypertension    Hypertensive heart disease    Obesity    OSA (obstructive sleep apnea)    uses CPAP nightly settings at 15    PAF (paroxysmal atrial fibrillation) (HCC)    Pneumonia    hx of years ago    Primary localized osteoarthritis of left knee    a. 11/2014 s/p L TKA.   Primary localized osteoarthritis of right knee    a. 02/2014 s/p R Partial Knee arthroplasty.   Stiffness of left knee    a. 12/2014 s/p manipulation under anesthesia.   Urine incontinence     Current Outpatient Medications  Medication Sig Dispense Refill   allopurinol  (ZYLOPRIM ) 100 MG tablet TAKE ONE TABLET (100 MG TOTAL) BY MOUTH DAILY. 90 tablet 1   baclofen  (LIORESAL ) 10 MG tablet TAKE ONE TABLET (10 MG TOTAL) BY MOUTH TWO TIMES DAILY. 180 tablet 1   colchicine  0.6 MG tablet TAKE ONE TABLET BY MOUTH TWICE A DAY 180 tablet 1   cyanocobalamin  (VITAMIN B12) 1000 MCG/ML injection Inject 1 mL (1,000 mcg total) into the skin every 30 (thirty) days. 3 mL 0   ELIQUIS  5 MG TABS tablet TAKE ONE TABLET BY MOUTH TWO TIMES DAILY 180 tablet 1   esomeprazole (NEXIUM) 40 MG capsule Take by mouth.     flecainide  (TAMBOCOR ) 100 MG tablet TAKE ONE TABLET BY MOUTH TWICE DAILY  180 tablet 2   furosemide  (LASIX ) 20 MG tablet TAKE ONE TABLET (20 MG TOTAL) BY MOUTH DAILY AS NEEDED. 90 tablet 2   Galcanezumab-gnlm 120 MG/ML SOAJ Inject 120 mg into the skin.     metoprolol  succinate (TOPROL -XL) 25 MG 24 hr tablet Take 25 mg by mouth.     pantoprazole  (PROTONIX ) 40 MG tablet TAKE ONE TABLET BY MOUTH ONCE A DAY 90 tablet 0   Syringe, Disposable, 3 ML MISC 1 Device by Does not apply route every 30 (thirty) days. 25 each 0   Syringe/Needle, Disp, 25G X 1 1 ML MISC Use for B12 injection 1 mL per dose. First 4 weeks, inject 1 dose weekly. Next 4 months injection one dose monthly. 8 each 0   traMADol  (ULTRAM ) 50 MG tablet Take 1 tablet (50 mg total) by mouth daily as needed. 30 tablet 0   No current facility-administered medications for this visit.    Allergies  Allergen Reactions   Penicillins Anaphylaxis and Other (See Comments)    From childhood; uncertain of reaction Has patient had a PCN reaction causing immediate rash, facial/tongue/throat swelling, SOB or lightheadedness with hypotension:  Unk Has patient had a PCN reaction causing severe rash involving mucus membranes or skin necrosis: Unk Has patient had a PCN reaction that required hospitalization: Unk Has patient had a PCN reaction occurring within the last 10 years: No If all of the above answers are NO, then may proceed with Cephalosporin use.   Vancomycin  Other (See Comments)    Red Mans' syndrome   Penicillins Other (See Comments)    Childhood   Vancomycin  Other (See Comments)    Red man Syndrome    Family History  Problem Relation Age of Onset   Coronary artery disease Mother    Hyperlipidemia Mother    Hypertension Mother    Arthritis Mother    Coronary artery disease Father    Heart disease Paternal Grandmother    Alzheimer's disease Paternal Grandfather     Social History   Socioeconomic History   Marital status: Married    Spouse name: Brogen Duell   Number of children: 2   Years of  education: 12   Highest education level: GED or equivalent  Occupational History   Occupation: Acupuncturist: Engineer, civil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) [employer]  Tobacco Use   Smoking status: Never   Smokeless tobacco: Never  Vaping Use   Vaping status: Never Used  Substance and Sexual Activity   Alcohol use: Not Currently    Comment: \   Drug use: No   Sexual activity: Yes  Other Topics Concern   Not on file  Social History Narrative   ** Merged History Encounter **       Social Drivers of Health   Financial Resource Strain: Low Risk  (11/06/2023)   Overall Financial Resource Strain (CARDIA)    Difficulty of Paying Living Expenses: Not hard at all  Food Insecurity: Food Insecurity Present (11/06/2023)   Hunger Vital Sign    Worried About Running Out of Food in the Last Year: Never true    Ran Out of Food in the Last Year: Sometimes true  Transportation Needs: No Transportation Needs (11/06/2023)   PRAPARE - Administrator, Civil Service (Medical): No    Lack of Transportation (Non-Medical): No  Physical Activity: Insufficiently Active (11/06/2023)   Exercise Vital Sign    Days of Exercise per Week: 3 days    Minutes of Exercise per Session: 10 min  Stress: No Stress Concern Present (11/06/2023)   [employer] of Occupational Health - Occupational Stress Questionnaire    Feeling of Stress: Not at all  Social Connections: Moderately Integrated (11/06/2023)   Social Connection and Isolation Panel    Frequency of Communication with Friends and Family: More than three times a week    Frequency of Social Gatherings with Friends and Family: More than three times a week    Attends Religious Services: More than 4 times per year    Active Member of [employer] or Organizations: No    Attends Banker Meetings: Not on file    Marital Status: Married  Catering manager Violence: Not At Risk (08/14/2023)   Humiliation, Afraid, Rape, and Kick questionnaire    Fear of Current or Ex-Partner: No     Emotionally Abused: No    Physically Abused: No    Sexually Abused: No     Constitutional: Pt reports intermittent headaches. Denies fever, malaise, fatigue, or abrupt weight changes.  HEENT: Patient reports vision changes.  Denies eye pain, eye redness, ear pain, ringing in the ears, wax buildup, runny nose, nasal congestion, bloody  nose, or sore throat. Respiratory: Denies difficulty breathing, shortness of breath, cough or sputum production.   Cardiovascular: Denies chest pain, chest tightness, palpitations or swelling in the hands or feet.  Gastrointestinal: Pt reports intermittent reflux. Denies abdominal pain, bloating, constipation, diarrhea or blood in the stool.  GU: Denies urgency, frequency, pain with urination, burning sensation, blood in urine, odor or discharge. Musculoskeletal: Patient reports joint pain.  Denies decrease in range of motion, difficulty with gait, muscle pain or joint swelling.  Skin: Pt reports skin tear to LLE. Denies redness, rashes, lesions or ulcercations.  Neurological: Patient reports intermittent dizziness.  Denies difficulty with memory, difficulty with speech or problems with balance and coordination.  Psych: Denies anxiety, depression, SI/HI.  No other specific complaints in a complete review of systems (except as listed in HPI above).  Objective:   Physical Exam  There were no vitals taken for this visit.   Wt Readings from Last 3 Encounters:  09/02/23 246 lb 9.6 oz (111.9 kg)  08/18/23 249 lb 6.4 oz (113.1 kg)  08/14/23 246 lb (111.6 kg)    General: Appears his stated age, obese, in NAD. Skin: Warm, dry. Skin tear noted to left medial lower leg, mild surrounding erythema but no drainage noted.  HEENT: Head: normal shape and size; Eyes: sclera white, no icterus, conjunctiva pink, PERRLA and EOMs intact;  Neck:  Neck supple, trachea midline. No masses, lumps or thyromegaly present.  Cardiovascular: Normal rate and rhythm. S1,S2 noted.   No murmur, rubs or gallops noted. No JVD. Trace pitting LLE edema. No carotid bruits noted. Pulmonary/Chest: Normal effort and positive vesicular breath sounds. No respiratory distress. No wheezes, rales or ronchi noted.  Abdomen: Normal bowel sounds.  Musculoskeletal: Joint enlargement noted of left knee. Strength 5/5 BUE/BLE.  No difficulty with gait.  Neurological: Alert and oriented. Cranial nerves II-XII grossly intact. Coordination normal.  Psychiatric: Mood and affect normal. Behavior is normal. Judgment and thought content normal.     BMET    Component Value Date/Time   NA 140 09/02/2023 0952   NA 145 (H) 02/05/2021 0909   K 4.1 09/02/2023 0952   CL 106 09/02/2023 0952   CO2 23 09/02/2023 0952   GLUCOSE 87 09/02/2023 0952   BUN 23 09/02/2023 0952   BUN 16 02/05/2021 0909   CREATININE 1.40 (H) 09/02/2023 0952   CALCIUM 9.3 09/02/2023 0952   GFRNONAA 58 (L) 12/12/2022 0437   GFRAA >60 10/19/2019 1337   GFRAA >60 10/19/2019 1337    Lipid Panel     Component Value Date/Time   CHOL 180 08/18/2023 1324   CHOL 143 02/05/2021 0909   TRIG 192 (H) 08/18/2023 1324   HDL 42 08/18/2023 1324   HDL 48 02/05/2021 0909   CHOLHDL 4.3 08/18/2023 1324   VLDL 22.4 01/17/2020 1214   LDLCALC 107 (H) 08/18/2023 1324    CBC    Component Value Date/Time   WBC 8.6 08/18/2023 1324   RBC 4.49 08/18/2023 1324   HGB 14.0 08/18/2023 1324   HGB 16.2 02/05/2021 0909   HCT 41.9 08/18/2023 1324   HCT 45.5 02/05/2021 0909   PLT 198 08/18/2023 1324   PLT 195 02/05/2021 0909   MCV 93.3 08/18/2023 1324   MCV 88 02/05/2021 0909   MCH 31.2 08/18/2023 1324   MCHC 33.4 08/18/2023 1324   RDW 13.2 08/18/2023 1324   RDW 12.6 02/05/2021 0909   LYMPHSABS 2.1 12/12/2022 0437   LYMPHSABS 1.6 10/31/2017 1438   MONOABS 0.7  12/12/2022 0437   EOSABS 0.3 12/12/2022 0437   EOSABS 0.2 10/31/2017 1438   BASOSABS 0.1 12/12/2022 0437   BASOSABS 0.0 10/31/2017 1438    Hgb A1C Lab Results  Component  Value Date   HGBA1C 5.4 08/18/2023          Assessment & Plan:     RTC in 3 months, follow-up chronic conditions Angeline Laura, NP

## 2023-11-07 NOTE — Telephone Encounter (Signed)
 Called and spoke with the patient.  Patient was seen earlier today in the office and had a question about what the EKG readings meant.  Explained the EKG findings as interpreted by Barnie Hila, NP.  Patient satisfied with the explanation.  Patient also had a question about his neurologist prescribing a low-dose blood pressure medication and whether he should take it or not.  Inquired about what the medication was, the dosage and frequency, and the reason for the provider for recommending the mediation.  Patient unable to provide answers to those questions.  Patient did state that he has been referred to see a Nephrologist, and he will go see them when he is able to make an initial visit.  Informed the patient to call back or through MyChart with the above information about the medication, and we will have one of our providers review it and confirm if it something that he needs to take.  Patient verbalized understanding with all questions and concerns addressed at this time.

## 2023-11-07 NOTE — Telephone Encounter (Signed)
 Patient says he received a notification on MyChart regarding his EKG and he would like a call back to discuss. Please advise.

## 2023-11-08 ENCOUNTER — Ambulatory Visit: Payer: Self-pay | Admitting: Student

## 2023-11-08 LAB — BASIC METABOLIC PANEL WITH GFR
BUN/Creatinine Ratio: 12 (ref 10–24)
BUN: 16 mg/dL (ref 8–27)
CO2: 24 mmol/L (ref 20–29)
Calcium: 9.1 mg/dL (ref 8.6–10.2)
Chloride: 103 mmol/L (ref 96–106)
Creatinine, Ser: 1.36 mg/dL — ABNORMAL HIGH (ref 0.76–1.27)
Glucose: 82 mg/dL (ref 70–99)
Potassium: 4.2 mmol/L (ref 3.5–5.2)
Sodium: 141 mmol/L (ref 134–144)
eGFR: 59 mL/min/1.73 — ABNORMAL LOW (ref >59)

## 2023-11-10 ENCOUNTER — Telehealth: Payer: Self-pay | Admitting: Emergency Medicine

## 2023-11-10 DIAGNOSIS — N184 Chronic kidney disease, stage 4 (severe): Secondary | ICD-10-CM

## 2023-11-10 NOTE — Progress Notes (Signed)
 Last read by Ozell JONETTA Dollar at 8:30AM on 11/10/2023.

## 2023-11-10 NOTE — Telephone Encounter (Signed)
 Requested Prescriptions  Pending Prescriptions Disp Refills   pantoprazole  (PROTONIX ) 40 MG tablet [Pharmacy Med Name: PANTOPRAZOLE  SODIUM 40MG  TABLET DR] 90 tablet 1    Sig: TAKE ONE TABLET BY MOUTH ONCE A DAY     Gastroenterology: Proton Pump Inhibitors Passed - 11/10/2023  1:54 PM      Passed - Valid encounter within last 12 months    Recent Outpatient Visits           2 months ago B12 deficiency   [place] [facility] [place], Angeline ORN, NP   2 months ago Encounter for general adult medical examination with abnormal findings   [place] [facility] [place], Angeline ORN, NP   3 months ago Orthostatic hypotension   [place] [facility] [place], Angeline ORN, NP   4 months ago Cellulitis of right upper extremity   [place] [facility] [place], Angeline ORN, NP   6 months ago Primary hypertension    [facility] [place], Angeline ORN, NP       Future Appointments             In 3 months [place], Barnie, NP [facility]

## 2023-11-10 NOTE — Telephone Encounter (Signed)
 No note

## 2023-11-10 NOTE — Telephone Encounter (Signed)
 Called and spoke with the patient to inquire if his PCP had placed a referral for a Nephrologist.  Patient did not think so.  Patient was concerned about his kidney function, stating that his neurologist had told him that he would have to get a pacemaker and be on dialysis.  Informed the patient that per Barnie Hila, NP, she did not see why the neurologist would have suggested that, but we have sent a monitor for him to wear and we will put a referral in for a nephrologist.  Patient verbalized understanding with all questions and concerns addressed at this time.  Per patient request, contact information for the nephrologist sent to patient's MyChart.

## 2023-11-17 ENCOUNTER — Other Ambulatory Visit: Payer: Self-pay | Admitting: Nephrology

## 2023-11-17 DIAGNOSIS — N1831 Chronic kidney disease, stage 3a: Secondary | ICD-10-CM

## 2023-11-20 ENCOUNTER — Ambulatory Visit
Admission: RE | Admit: 2023-11-20 | Discharge: 2023-11-20 | Disposition: A | Discharge status: Home / Self Care | Source: Ambulatory Visit | Attending: Nephrology | Admitting: Nephrology

## 2023-11-20 DIAGNOSIS — N1831 Chronic kidney disease, stage 3a: Secondary | ICD-10-CM | POA: Diagnosis present

## 2023-11-27 DIAGNOSIS — I480 Paroxysmal atrial fibrillation: Secondary | ICD-10-CM | POA: Diagnosis not present

## 2023-11-27 NOTE — Telephone Encounter (Signed)
 Called and spoke with patient. Patient states that he has already spoken to Barnie Hila, NP and all questions and concerns were addressed.

## 2023-11-28 NOTE — Progress Notes (Signed)
 Last read by Ozell JONETTA Dollar at 9:02PM on 11/27/2023.

## 2023-12-09 ENCOUNTER — Ambulatory Visit: Attending: Neurology

## 2023-12-09 ENCOUNTER — Other Ambulatory Visit: Payer: Self-pay | Admitting: Internal Medicine

## 2023-12-09 DIAGNOSIS — R29818 Other symptoms and signs involving the nervous system: Secondary | ICD-10-CM | POA: Insufficient documentation

## 2023-12-09 DIAGNOSIS — R42 Dizziness and giddiness: Secondary | ICD-10-CM | POA: Insufficient documentation

## 2023-12-09 NOTE — Therapy (Signed)
 OUTPATIENT PHYSICAL THERAPY EVALUATION   Patient Name: Richard [name] MRN: [id number] DOB:1960/12/13, 63 y.o., male Today's Date: 12/09/2023  PCP: Antonette Angeline ORN, NP REFERRING PROVIDER: Lane Arthea BRAVO, MD  END OF SESSION:  PT End of Session - 12/09/23 2110     Visit Number 1    Number of Visits 12    Date for Recertification  01/20/24    Authorization Type [employer] Medicare    Authorization Time Period 12/09/23-01/20/24    Progress Note Due on Visit 10    PT Start Time 1530    PT Stop Time 1610    PT Time Calculation (min) 40 min    Activity Tolerance Patient tolerated treatment well;No increased pain    Behavior During Therapy [facility] for tasks assessed/performed          Past Medical History:  Diagnosis Date   Arthritis    Arthrofibrosis of total knee arthroplasty 05/30/2015   Bilateral pulmonary embolism (HCC)    a. 03/2015 CTA: acute bilat PE; b. IVC filter placed in 2017--> removed 2018.   Chest pain    a. 2015 Abnl MV;  b. 2015 Cath: nl cors.   Chronic diastolic CHF (congestive heart failure) (HCC)    a. 03/2015 Echo: EF 55-65%, poor windows, mildly dil RV.   DVT (deep venous thrombosis) (HCC)    a. 03/2015 U/S: occlusive DVT w/in the distal aspect of the Left fem vein through the L popliteal vein.   Dyspnea    On exertion   GERD (gastroesophageal reflux disease)    Headache    Hypertension    Hypertensive heart disease    Obesity    OSA (obstructive sleep apnea)    uses CPAP nightly settings at 15    PAF (paroxysmal atrial fibrillation) (HCC)    Pneumonia    hx of years ago    Primary localized osteoarthritis of left knee    a. 11/2014 s/p L TKA.   Primary localized osteoarthritis of right knee    a. 02/2014 s/p R Partial Knee arthroplasty.   Stiffness of left knee    a. 12/2014 s/p manipulation under anesthesia.   Urine incontinence    Past Surgical History:  Procedure Laterality Date   APPENDECTOMY     CARDIAC CATHETERIZATION     CARPAL TUNNEL  RELEASE Left 03/2018   CYSTOSCOPY W/ RETROGRADES  12/09/2019   Procedure: CYSTOSCOPY WITH RETROGRADE PYELOGRAM;  Surgeon: Kassie Ozell SAUNDERS, MD;  Location: [facility] ORS;  Service: Urology;;   CYSTOSCOPY WITH STENT PLACEMENT Right 12/09/2019   Procedure: CYSTOSCOPY WITH STENT PLACEMENT;  Surgeon: Kassie Ozell SAUNDERS, MD;  Location: [facility];  Service: Urology;  Laterality: Right;   EXAM UNDER ANESTHESIA WITH MANIPULATION OF KNEE Left 05/30/2015   Procedure: EXAM UNDER ANESTHESIA WITH MANIPULATION OF LEFT KNEE;  Surgeon: Fonda Olmsted, MD;  Location: [facility];  Service: Orthopedics;  Laterality: Left;   HIATAL HERNIA REPAIR N/A 10/26/2019   Procedure: HIATAL HERNIA REPAIR;  Surgeon: Gladis Cough, MD;  Location: [facility];  Service: General;  Laterality: N/A;   I & D KNEE WITH POLY EXCHANGE Left 10/30/2015   Procedure: IRRIGATION AND DEBRIDEMENT KNEE WITH POLY EXCHANGE PLACE SPACERS;  Surgeon: Dempsey Sensor, MD;  Location: [facility];  Service: Orthopedics;  Laterality: Left;  OFF ELIQUIST 3 DAYS   IVC FILTER PLACEMENT ([facility] HX)  04/13/15   IVC FILTER REMOVAL N/A 08/13/2016   Procedure: IVC Filter Removal;  Surgeon: Jama Cordella MATSU, MD;  Location: [facility];  Service: Cardiovascular;  Laterality: N/A;   JOINT REPLACEMENT     KNEE ARTHROSCOPY Left 08/08/2015   Procedure: LEFT KNEE MANIPULATION WITH ARTHROSCOPIC LYSIS OF ADHESIONS AND ASPIRATION;  Surgeon: Fonda Olmsted, MD;  Location: [facility];  Service: Orthopedics;  Laterality: Left;   KNEE CLOSED REDUCTION Left 01/20/2015   Procedure: LEFT KNEE MANIPULATION;  Surgeon: Fonda Olmsted, MD;  Location: [facility];  Service: Orthopedics;  Laterality: Left;   LAPAROSCOPIC GASTRIC SLEEVE RESECTION N/A 10/26/2019   Procedure: LAPAROSCOPIC SLEEVE GASTRECTOMY;  Surgeon: Gladis Cough, MD;  Location: [facility];  Service: General;  Laterality: N/A;   LEFT HEART CATHETERIZATION WITH CORONARY ANGIOGRAM N/A 01/12/2014   Procedure: LEFT HEART CATHETERIZATION WITH  CORONARY ANGIOGRAM;  Surgeon: Candyce GORMAN Reek, MD;  Location: [facility] C[facility];  Service: Cardiovascular;  Laterality: N/A;   ORTHOPEDIC SURGERY Left    arthroscopy x3   PARTIAL KNEE ARTHROPLASTY Right 02/25/2014   Procedure: RIGHT KNEE ARTHROPLASTY CONDYLE AND PLATEAU MEDIAL COMPARTMENT ;  Surgeon: Fonda SHAUNNA Olmsted, MD;  Location: [facility];  Service: Orthopedics;  Laterality: Right;   PERIPHERAL VASCULAR CATHETERIZATION N/A 03/29/2015   Procedure: IVC Filter Insertion, and possible thrombectomy;  Surgeon: Cordella KANDICE Shawl, MD;  Location: [facility];  Service: Cardiovascular;  Laterality: N/A;   ROTATOR CUFF REPAIR Left    TOTAL KNEE ARTHROPLASTY  12/06/2014   Procedure: TOTAL KNEE ARTHROPLASTY;  Surgeon: Fonda Olmsted, MD;  Location[facility] OR;  Service: Orthopedics;;   TOTAL KNEE REVISION Left 02/08/2016   Procedure: TOTAL KNEE REVISION;  Surgeon: Dempsey Sensor, MD;  Location: [facility];  Service: Orthopedics;  Laterality: Left;   UPPER GI ENDOSCOPY N/A 10/26/2019   Procedure: UPPER GI ENDOSCOPY;  Surgeon: Gladis Cough, MD;  Location: [facility];  Service: General;  Laterality: N/A;   URETEROSCOPY WITH HOLMIUM LASER LITHOTRIPSY Right 12/30/2019   Procedure: URETEROSCOPY WITH HOLMIUM LASER LITHOTRIPSY;  Surgeon: Kassie Ozell SAUNDERS, MD;  Location: [facility] OR[facility];  Service: Urology;  Laterality: Right;   Patient Active Problem List   Diagnosis Date Noted   TIA (transient ischemic attack) 12/10/2022   History of DVT (deep vein thrombosis) 11/09/2021   Gout 11/09/2021   Class 2 obesity due to excess calories with body mass index (BMI) of 36.0 to 36.9 in adult 05/07/2021   OA (osteoarthritis) 06/23/2018   History of pulmonary embolus (PE) 06/23/2018   CHF (congestive heart failure), NYHA class II, chronic, diastolic (HCC) 06/23/2018   PAF (paroxysmal atrial fibrillation) (HCC) 03/30/2015   Hypertension 08/17/2011   GERD (gastroesophageal reflux disease) 08/17/2011   OSA (obstructive sleep  apnea) 08/16/2011    ONSET DATE: March 2025  REFERRING DIAG: Vertigo, migraine, dizziness  THERAPY DIAG:  Dizziness and giddiness  Other symptoms and signs involving the nervous system  Rationale for Evaluation and Treatment: Rehabilitation  SUBJECTIVE:  SUBJECTIVE STATEMENT: Pt continued to have vertigo episodes, increasing in frequency.   PERTINENT HISTORY:   Barrie Wale is a 63yoM who reports insidious onset episodes of vertigo that started 6 months prior, episodes preceded by disorganized spontaneous eye movements, 3-4 minutes of vertigo. Pt denies any LOB, falls, N/V. Pt denies any related changes to hearing, tinnitus, loss of vision. Pt denies any imbalance. Pt denies any activity pattern with episodes, albeit he reports none have occurred while in bed. Occurrence of episodes has slowly increased over the past 2 months, recently has 2 in a single day. Pt has ben evaluated by neurology and cardiology, no definitive diagnosis as of time of evaluation here. Pt does have history of migraine HA, however he denies any migraine-like symptoms associated with these episodes- also has recently be switched to a different medication for this. Pt denies any history of concussion any recent head injury.   PAIN:  Are you having pain? No   PRECAUTIONS: None  WEIGHT BEARING RESTRICTIONS: No  FALLS: Has patient fallen in last 6 months? No  PLOF: Independent   PATIENT GOALS: Stop having episodes  OBJECTIVE:  Note: Objective measures were completed at Evaluation unless otherwise noted.  ORTHOSTATIC VITAL SIGNS: 158/36mmHg 68 BPM seated 128/30mmHg 71 bpm standing 0 min 141/18mmHg 52 bpm standing 1 min  DIAGNOSTIC FINDINGS:   VESTIBULAR ASSESSMENT:  Oculomotor exam:  Fixed gaze: spontaneous eye  twitching, horizontal, bilat  Horizontal pursuits: saccadic intrusions toward left   Lateral gaze: no nystagmus bilat   Vertical Smooth pursuits: WNL  Horizontal saccades: overshooting from Left to center Head Impulse Test: Negative bilat Test of Skew: Negative  Vision suppressed testing:  Head Shaking nystagmus Test: negative  Atfer 15sec, has disrupted VOR: Left horizontal beats during Right eye movements upon passive Left head rotation.  Fixed gaze: WNL  Horizontal roll test: negative  Lateral gaze: negative Right; Left beating horizontal nystagmus Left (still has left horizontal beating after return to forward gaze)  Upward/downward gaze: small brief direction changing nystagmus (Right beating when up, down or left beating with down)   Bed mobility: seated Left EOB to supine, pt has left lateral, right lateral, and downward beating nystagmus until stationary.                                                                                                                                TREATMENT DATE 12/09/23:   Evaluation only- no treatment this date.   PATIENT EDUCATION: Education details: Results of assessment thus far; tests non specific, but noted impairment of visual system.  Person educated: Garrel  Education method: collaborative learning, deliberate practice, positive reinforcement, explicit instruction, establish rules. Education comprehension: verbalized understanding  HOME EXERCISE PROGRAM: None  GOALS: Goals reviewed with patient? No   LONG TERM GOALS: Target date: 01/20/24  Reduced DHI score to indicate improved self reported disability.  Baseline:  Goal status: INITIAL  2.  Independence with HEP for  visual accomodation training.  Baseline:  Goal status: INITIAL   ASSESSMENT:  CLINICAL IMPRESSION: 63yoM referral to OPPT for vertigo, vestibular migraine. Exam notable for central vertigo, direction changing nystagmus, abnormal ocular movements at rest and  impaired VOR during bed mobility or head movements. Will complete remaining tests and measures on visit 2, POC pending those results. Based on findings thus far, pt my have poor prognosis for improvement from PT services, may benefit more from management with neurology, however will need to complete additional tests prior to ultimate recommendations.   OBJECTIVE IMPAIRMENTS: Decreased knowledge of condition, decreased use of DME, decreased mobility, difficulty walking, decreased strength, decreased ROM. ACTIVITY LIMITATIONS: Lifting, standing, walking, squatting, transfers, locomotion level PARTICIPATION LIMITATIONS: Cleaning, laundry, interpersonal relationships, driving, yardwork, community activity.  PERSONAL FACTORS: Age, behavior pattern, education, past/current experiences, transportation, profession  are also affecting patient's functional outcome.  REHAB POTENTIAL: Good CLINICAL DECISION MAKING: Medium  EVALUATION COMPLEXITY: Moderate    PLAN:  PT FREQUENCY: 1-2x/week  PT DURATION: 6 weeks  PLANNED INTERVENTIONS: 97110-Therapeutic exercises, 97530- Therapeutic activity, 97112- Neuromuscular re-education, 97535- Self Care, [id number]- Manual therapy, Patient/Family education, Vestibular training, and Visual/preceptual remediation/compensation  PLAN FOR NEXT SESSION: Finish remaining tests, DVAT, gait assessment, balance screening   9:52 PM, 12/09/23 Peggye JAYSON Linear, PT, DPT Physical Therapist - [facility]  Outpatient Physical Therapy- Main Campus [phone]     [place] C, PT 12/09/2023, 9:12 PM

## 2023-12-12 NOTE — Telephone Encounter (Signed)
 Requested medication (s) are due for refill today: yes  Requested medication (s) are on the active medication list: yes  Last refill:  09/19/23 #3 ml 0 refills  Future visit scheduled: no   Notes to clinic:  no refills remain. Do you want to continue refills?     Requested Prescriptions  Pending Prescriptions Disp Refills   cyanocobalamin  (VITAMIN B12) 1000 MCG/ML injection [Pharmacy Med Name: CYANOCOBALAMIN  SOLUTION] 3 mL 0    Sig: INJECT 1 ML (1,000 MCG TOTAL) INTO THE SKIN EVERY 30 DAYS.     Endocrinology:  Vitamins - Vitamin B12 Passed - 12/12/2023  7:37 AM      Passed - HCT in normal range and within 360 days    HCT  Date Value Ref Range Status  08/18/2023 41.9 38.5 - 50.0 % Final   Hematocrit  Date Value Ref Range Status  02/05/2021 45.5 37.5 - 51.0 % Final         Passed - HGB in normal range and within 360 days    Hemoglobin  Date Value Ref Range Status  08/18/2023 14.0 13.2 - 17.1 g/dL Final  98/83/7976 83.7 13.0 - 17.7 g/dL Final         Passed - B12 Level in normal range and within 360 days    Vitamin B-12  Date Value Ref Range Status  08/18/2023 249 200 - 1,100 pg/mL Final    Comment:    . Please Note: Although the reference range for vitamin B12 is [phone] pg/mL, it has been reported that between 5 and 10% of patients with values between 200 and 400 pg/mL may experience neuropsychiatric and hematologic abnormalities due to occult B12 deficiency; less than 1% of patients with values above 400 pg/mL will have symptoms. SABRA Amy - Valid encounter within last 12 months    Recent Outpatient Visits           3 months ago B12 deficiency   [facility] [place], Angeline ORN, NP   3 months ago Encounter for general adult medical examination with abnormal findings   [place] [facility] [place], Angeline ORN, NP   4 months ago Orthostatic hypotension   [place] [facility] [place],  Angeline ORN, NP   5 months ago Cellulitis of right upper extremity   [place] [facility], Angeline ORN, NP   7 months ago Primary hypertension    [facility], Angeline ORN, NP       Future Appointments             In 2 months Loistine, Barnie, NP [facility]

## 2023-12-16 ENCOUNTER — Ambulatory Visit

## 2023-12-16 ENCOUNTER — Other Ambulatory Visit: Payer: Self-pay | Admitting: Internal Medicine

## 2023-12-16 DIAGNOSIS — R42 Dizziness and giddiness: Secondary | ICD-10-CM

## 2023-12-16 DIAGNOSIS — R29818 Other symptoms and signs involving the nervous system: Secondary | ICD-10-CM
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
## 2023-12-17 ENCOUNTER — Other Ambulatory Visit: Payer: Self-pay | Admitting: Internal Medicine

## 2023-12-17 DIAGNOSIS — M150 Primary generalized (osteo)arthritis: Secondary | ICD-10-CM

## 2023-12-19 NOTE — Telephone Encounter (Signed)
 Requested Prescriptions  Pending Prescriptions Disp Refills   colchicine  0.6 MG tablet [Pharmacy Med Name: COLCHICINE  0.6MG  TABLET] 180 tablet 1    Sig: TAKE ONE TABLET BY MOUTH TWICE A DAY     Endocrinology:  Gout Agents - colchicine  Failed - 12/19/2023  4:05 PM      Failed - Cr in normal range and within 360 days    Creat  Date Value Ref Range Status  09/02/2023 1.40 (H) 0.70 - 1.35 mg/dL Final   Creatinine, Ser  Date Value Ref Range Status  11/07/2023 1.36 (H) 0.76 - 1.27 mg/dL Final         Failed - CBC within normal limits and completed in the last 12 months    WBC  Date Value Ref Range Status  08/18/2023 8.6 3.8 - 10.8 Thousand/uL Final   RBC  Date Value Ref Range Status  08/18/2023 4.49 4.20 - 5.80 Million/uL Final   Hemoglobin  Date Value Ref Range Status  08/18/2023 14.0 13.2 - 17.1 g/dL Final  98/83/7976 83.7 13.0 - 17.7 g/dL Final   HCT  Date Value Ref Range Status  08/18/2023 41.9 38.5 - 50.0 % Final   Hematocrit  Date Value Ref Range Status  02/05/2021 45.5 37.5 - 51.0 % Final   MCHC  Date Value Ref Range Status  08/18/2023 33.4 32.0 - 36.0 g/dL Final    Comment:    For adults, a slight decrease in the calculated MCHC value (in the range of 30 to 32 g/dL) is most likely not clinically significant; however, it should be interpreted with caution in correlation with other red cell parameters and the patient's clinical condition.    [facility]  Date Value Ref Range Status  08/18/2023 31.2 27.0 - 33.0 pg Final   MCV  Date Value Ref Range Status  08/18/2023 93.3 80.0 - 100.0 fL Final  02/05/2021 88 79 - 97 fL Final   No results found for: PLTCOUNTKUC, LABPLAT, POCPLA RDW  Date Value Ref Range Status  08/18/2023 13.2 11.0 - 15.0 % Final  02/05/2021 12.6 11.6 - 15.4 % Final         Passed - ALT in normal range and within 360 days    ALT  Date Value Ref Range Status  08/18/2023 35 9 - 46 U/L Final         Passed - AST in normal range and  within 360 days    AST  Date Value Ref Range Status  08/18/2023 27 10 - 35 U/L Final         Passed - Valid encounter within last 12 months    Recent Outpatient Visits           3 months ago B12 deficiency   [place] [facility] [place], Angeline ORN, NP   4 months ago Encounter for general adult medical examination with abnormal findings   [place], Angeline ORN, NP   4 months ago Orthostatic hypotension   [place] [facility] [place], Angeline ORN, NP   5 months ago Cellulitis of right upper extremity   [place] [facility] [place], Angeline ORN, NP   7 months ago Primary hypertension   [place] [facility] [place], Angeline ORN, NP       Future Appointments             In 2 months Loistine, Barnie, NP [facility]

## 2023-12-20 ENCOUNTER — Other Ambulatory Visit: Payer: Self-pay | Admitting: Cardiovascular Disease

## 2023-12-20 DIAGNOSIS — I480 Paroxysmal atrial fibrillation: Secondary | ICD-10-CM

## 2023-12-23 ENCOUNTER — Ambulatory Visit: Attending: Neurology

## 2023-12-23 DIAGNOSIS — R42 Dizziness and giddiness: Secondary | ICD-10-CM | POA: Diagnosis present

## 2023-12-23 DIAGNOSIS — R29818 Other symptoms and signs involving the nervous system: Secondary | ICD-10-CM | POA: Insufficient documentation

## 2023-12-23 NOTE — Telephone Encounter (Signed)
 Prescription refill request for Eliquis  received. Indication:afib Last office visit:10/25 Scr: 1.34  10/25 Age:63 Weight:115.2  kg  Prescription refilled

## 2023-12-23 NOTE — Telephone Encounter (Signed)
 Requested medication (s) are due for refill today -yes  Requested medication (s) are on the active medication list -yes  Future visit scheduled -no  Last refill: 08/26/23 #30  Notes to clinic: non delegated Rx  Requested Prescriptions  Pending Prescriptions Disp Refills   traMADol  (ULTRAM ) 50 MG tablet [Pharmacy Med Name: TRAMADOL  HYDROCHLORIDE 50MG  TABLET] 30 tablet 0    Sig: Take 1 tablet (50 mg total) by mouth daily as needed.     Not Delegated - Analgesics:  Opioid Agonists Failed - 12/23/2023  9:49 AM      Failed - This refill cannot be delegated      Failed - Urine Drug Screen completed in last 360 days      Failed - Valid encounter within last 3 months    Recent Outpatient Visits           3 months ago B12 deficiency   [place] [facility] [place], Angeline ORN, NP   4 months ago Encounter for general adult medical examination with abnormal findings   [place] [facility] [place] [place], Angeline ORN, NP   4 months ago Orthostatic hypotension   [place] [facility] [place], Angeline ORN, NP   5 months ago Cellulitis of right upper extremity   [place] [facility] [place], Angeline ORN, NP   7 months ago Primary hypertension   [place] [facility] [place], Angeline ORN, NP       Future Appointments             In 1 month W[place], Barnie, NP [place] [facility] [employer] Prescriptions  Pending Prescriptions Disp Refills   traMADol  (ULTRAM ) 50 MG tablet [Pharmacy Med Name: TRAMADOL  HYDROCHLORIDE 50MG  TABLET] 30 tablet 0    Sig: Take 1 tablet (50 mg total) by mouth daily as needed.     Not Delegated - Analgesics:  Opioid Agonists Failed - 12/23/2023  9:49 AM      Failed - This refill cannot be delegated      Failed - Urine Drug Screen completed in last 360 days      Failed - Valid encounter within last 3 months    Recent Outpatient Visits           3 months ago  B12 deficiency   [place] [facility] [place], Angeline ORN, NP   4 months ago Encounter for general adult medical examination with abnormal findings   [place] [facility] [place], Angeline ORN, NP   4 months ago Orthostatic hypotension   [place] [facility] [place], Angeline ORN, NP   5 months ago Cellulitis of right upper extremity   [place] [facility] [place], Angeline ORN, NP   7 months ago Primary hypertension   [place] [facility] [place], Angeline ORN, NP       Future Appointments             In 1 month W[place], Barnie, NP [facility]

## 2023-12-23 NOTE — Therapy (Signed)
 OUTPATIENT PHYSICAL THERAPY TREATMENT   Patient Name: Richard [name] MRN: [id number] DOB:Dec 09, 1960, 63 y.o., male Today's Date: 12/23/2023  PCP: Antonette Angeline ORN, NP REFERRING PROVIDER: Lane Arthea BRAVO, MD  END OF SESSION:  PT End of Session - 12/23/23 0854     Visit Number 3    Number of Visits 12    Date for Recertification  01/20/24    Authorization Type [employer]    Authorization Time Period 12/09/23-01/20/24    Progress Note Due on Visit 10    PT Start Time 0850    PT Stop Time 0930    PT Time Calculation (min) 40 min    Activity Tolerance Patient tolerated treatment well;No increased pain    Behavior During Therapy [facility] for tasks assessed/performed          Past Medical History:  Diagnosis Date   Arthritis    Arthrofibrosis of total knee arthroplasty 05/30/2015   Bilateral pulmonary embolism (HCC)    a. 03/2015 CTA: acute bilat PE; b. IVC filter placed in 2017--> removed 2018.   Chest pain    a. 2015 Abnl MV;  b. 2015 Cath: nl cors.   Chronic diastolic CHF (congestive heart failure) (HCC)    a. 03/2015 Echo: EF 55-65%, poor windows, mildly dil RV.   DVT (deep venous thrombosis) (HCC)    a. 03/2015 U/S: occlusive DVT w/in the distal aspect of the Left fem vein through the L popliteal vein.   Dyspnea    On exertion   GERD (gastroesophageal reflux disease)    Headache    Hypertension    Hypertensive heart disease    Obesity    OSA (obstructive sleep apnea)    uses CPAP nightly settings at 15    PAF (paroxysmal atrial fibrillation) (HCC)    Pneumonia    hx of years ago    Primary localized osteoarthritis of left knee    a. 11/2014 s/p L TKA.   Primary localized osteoarthritis of right knee    a. 02/2014 s/p R Partial Knee arthroplasty.   Stiffness of left knee    a. 12/2014 s/p manipulation under anesthesia.   Urine incontinence    Past Surgical History:  Procedure Laterality Date   APPENDECTOMY     CARDIAC CATHETERIZATION     CARPAL TUNNEL RELEASE  Left 03/2018   CYSTOSCOPY W/ RETROGRADES  12/09/2019   Procedure: CYSTOSCOPY WITH RETROGRADE PYELOGRAM;  Surgeon: Kassie Ozell SAUNDERS, MD;  Location: [facility] ORS;  Service: Urology;;   CYSTOSCOPY WITH STENT PLACEMENT Right 12/09/2019   Procedure: CYSTOSCOPY WITH STENT PLACEMENT;  Surgeon: Kassie Ozell SAUNDERS, MD;  Location: [facility];  Service: Urology;  Laterality: Right;   EXAM UNDER ANESTHESIA WITH MANIPULATION OF KNEE Left 05/30/2015   Procedure: EXAM UNDER ANESTHESIA WITH MANIPULATION OF LEFT KNEE;  Surgeon: Fonda Olmsted, MD;  Location: [facility];  Service: Orthopedics;  Laterality: Left;   HIATAL HERNIA REPAIR N/A 10/26/2019   Procedure: HIATAL HERNIA REPAIR;  Surgeon: Gladis Cough, MD;  Location: [facility];  Service: General;  Laterality: N/A;   I & D KNEE WITH POLY EXCHANGE Left 10/30/2015   Procedure: IRRIGATION AND DEBRIDEMENT KNEE WITH POLY EXCHANGE PLACE SPACERS;  Surgeon: Dempsey Sensor, MD;  Location: [facility];  Service: Orthopedics;  Laterality: Left;  OFF ELIQUIST 3 DAYS   IVC FILTER PLACEMENT ([facility] HX)  04/13/15   IVC FILTER REMOVAL N/A 08/13/2016   Procedure: IVC Filter Removal;  Surgeon: Jama Cordella MATSU, MD;  Location: [facility];  Service: Cardiovascular;  Laterality: N/A;   JOINT REPLACEMENT     KNEE ARTHROSCOPY Left 08/08/2015   Procedure: LEFT KNEE MANIPULATION WITH ARTHROSCOPIC LYSIS OF ADHESIONS AND ASPIRATION;  Surgeon: Fonda Olmsted, MD;  Location: [facility];  Service: Orthopedics;  Laterality: Left;   KNEE CLOSED REDUCTION Left 01/20/2015   Procedure: LEFT KNEE MANIPULATION;  Surgeon: Fonda Olmsted, MD;  Location: [facility];  Service: Orthopedics;  Laterality: Left;   LAPAROSCOPIC GASTRIC SLEEVE RESECTION N/A 10/26/2019   Procedure: LAPAROSCOPIC SLEEVE GASTRECTOMY;  Surgeon: Gladis Cough, MD;  Location: [facility];  Service: General;  Laterality: N/A;   LEFT HEART CATHETERIZATION WITH CORONARY ANGIOGRAM N/A 01/12/2014   Procedure: LEFT HEART CATHETERIZATION WITH  CORONARY ANGIOGRAM;  Surgeon: Candyce GORMAN Reek, MD;  Location: [facility] CATH LAB;  Service: Cardiovascular;  Laterality: N/A;   ORTHOPEDIC SURGERY Left    arthroscopy x3   PARTIAL KNEE ARTHROPLASTY Right 02/25/2014   Procedure: RIGHT KNEE ARTHROPLASTY CONDYLE AND PLATEAU MEDIAL COMPARTMENT ;  Surgeon: Fonda SHAUNNA Olmsted, MD;  Location: [facility];  Service: Orthopedics;  Laterality: Right;   PERIPHERAL VASCULAR CATHETERIZATION N/A 03/29/2015   Procedure: IVC Filter Insertion, and possible thrombectomy;  Surgeon: Cordella KANDICE Shawl, MD;  Location: [facility];  Service: Cardiovascular;  Laterality: N/A;   ROTATOR CUFF REPAIR Left    TOTAL KNEE ARTHROPLASTY  12/06/2014   Procedure: TOTAL KNEE ARTHROPLASTY;  Surgeon: Fonda Olmsted, MD;  Location: [facility] OR;  Service: Orthopedics;;   TOTAL KNEE REVISION Left 02/08/2016   Procedure: TOTAL KNEE REVISION;  Surgeon: Dempsey Sensor, MD;  Location: [facility];  Service: Orthopedics;  Laterality: Left;   UPPER GI ENDOSCOPY N/A 10/26/2019   Procedure: UPPER GI ENDOSCOPY;  Surgeon: Gladis Cough, MD;  Location: [facility];  Service: General;  Laterality: N/A;   URETEROSCOPY WITH HOLMIUM LASER LITHOTRIPSY Right 12/30/2019   Procedure: URETEROSCOPY WITH HOLMIUM LASER LITHOTRIPSY;  Surgeon: Kassie Ozell SAUNDERS, MD;  Location: [facility];  Service: Urology;  Laterality: Right;   Patient Active Problem List   Diagnosis Date Noted   TIA (transient ischemic attack) 12/10/2022   History of DVT (deep vein thrombosis) 11/09/2021   Gout 11/09/2021   Class 2 obesity due to excess calories with body mass index (BMI) of 36.0 to 36.9 in adult 05/07/2021   OA (osteoarthritis) 06/23/2018   History of pulmonary embolus (PE) 06/23/2018   CHF (congestive heart failure), NYHA class II, chronic, diastolic (HCC) 06/23/2018   PAF (paroxysmal atrial fibrillation) (HCC) 03/30/2015   Hypertension 08/17/2011   GERD (gastroesophageal reflux disease) 08/17/2011   OSA (obstructive sleep  apnea) 08/16/2011    ONSET DATE: March 2025  REFERRING DIAG: Vertigo, migraine, dizziness  THERAPY DIAG:  Dizziness and giddiness  Other symptoms and signs involving the nervous system  Rationale for Evaluation and Treatment: Rehabilitation  SUBJECTIVE:  SUBJECTIVE STATEMENT: Pt reports 4-5 dizzy episodes since last session, 1 on a ladder, 1 while watching TV, 1 in the work shop on [place], none lasting longer than 4-5 minutes. Pt denies any falls or other updates. Pt again denies any correlation with historical migraine HA symptoms.   PERTINENT HISTORY:   Richard [name] is a 63yoM who reports insidious onset episodes of vertigo that started 6 months prior, episodes preceded by disorganized spontaneous eye movements, 3-4 minutes of vertigo. Pt denies any LOB, falls, N/V. Pt denies any related changes to hearing, tinnitus, loss of vision. Pt denies any imbalance. Pt denies any activity pattern with episodes, albeit he reports none have occurred while in bed. Occurrence of episodes has slowly increased over the past 2 months, recently has 2 in a single day. Pt has ben evaluated by neurology and cardiology, no definitive diagnosis as of time of evaluation here. Pt does have history of migraine HA, however he denies any migraine-like symptoms associated with these episodes- also has recently be switched to a different medication for this. Pt denies any history of concussion any recent head injury.   PAIN:  Are you having pain? No   PRECAUTIONS: None  WEIGHT BEARING RESTRICTIONS: No  FALLS: Has patient fallen in last 6 months? No  PLOF: Independent   PATIENT GOALS: Stop having episodes  OBJECTIVE:  Note: Objective measures were completed at Evaluation unless otherwise noted.  ORTHOSTATIC VITAL  SIGNS: Eval 158/7mmHg 68 BPM seated 128/50mmHg 71 bpm standing 0 min 141/17mmHg 52 bpm standing 1 min  ORTHOSTATIC VITAL SIGNS 12/16/23 137/59mmHg 61bpm supine  136/85 mmHg 64bpm  seated  130/38mmHg 67bpm standing 125/30mmHg 66bpm standing x1  No symptoms, then AMB 422ft overground, no device 24m57s (1.83m/s) 141/73mmHg 65bpm post exercise vitals   ORTHOSTATIC VITALSIGNS 12/23/23 131/72 mmHg 55bpm seated x1 117/72 mmHg 56bpm standing at 0 117/95 mmHg 66bpm standing at 1 minute 112/76 mmHg 62bpm standing at 2 minutes    DIAGNOSTIC FINDINGS:  Multiple unremarkable MRI over past 14 months   VESTIBULAR ASSESSMENT: Oculomotor exam:  Fixed gaze: spontaneous eye twitching, horizontal, bilat  Horizontal pursuits: saccadic intrusions toward left   Lateral gaze: no nystagmus bilat   Vertical Smooth pursuits: WNL  Horizontal saccades: overshooting from Left to center Head Impulse Test: Negative bilat Test of Skew: Negative  Vision suppressed testing:  Head Shaking nystagmus Test: negative  Atfer 15sec, has disrupted VOR: Left horizontal beats during Right eye movements upon passive Left head rotation.  Fixed gaze: WNL  Horizontal roll test: negative  Lateral gaze: negative Right; Left beating horizontal nystagmus Left (still has left horizontal beating after return to forward gaze)  Upward/downward gaze: small brief direction changing nystagmus (Right beating when up, down or left beating with down)   Bed mobility: seated Left EOB to supine, pt has left lateral, right lateral, and downward beating nystagmus until stationary.  TREATMENT DATE 12/23/23:    ORTHOSTATIC VITALSIGNS 12/23/23 131/72 mmHg 55bpm seated x1 117/72 mmHg 56bpm standing at 0 117/95 mmHg 66bpm standing at 1 minute 112/76 mmHg 62bpm standing at 2 minutes   -standing VOR practice 1x60 sec  transverse plane rotation alt Rt/Lt (~20 degrees each way)  -standing VOR practice 1x60 sec vertical head movements   -standing VOR practice 1x60 sec transverse plane rotation alt Rt/Lt (~20 degrees each way)  -standing VOR practice 1x60 sec vertical head movements -standing VOR practice 1x60 sec transverse plane rotation alt Rt/Lt (~20 degrees each way)  -standing VOR practice 1x60 sec vertical head movements   -standing VOR practice 1x60 sec transverse plane rotation alt Rt/Lt (~20 degrees each way)  -standing VOR practice 1x60 sec vertical head movements  -pencil pushups 1x15 with 15 playing cards -standing rotation + ball rebound off letters called at random 1x15 bilat (no symptoms)  -VOR cancelation x20 c card (difficulty coordinating all moving parts on that one)  -VOR cancellation x20 with headlamp (issued for home)    PATIENT EDUCATION: Education details: detailed HEP performance again, shor tterm plan for DC to independent program.   Person educated: Richard [name]  Education method: collaborative learning, deliberate practice, positive reinforcement, explicit instruction, establish rules. Education comprehension: verbalized understanding  HOME EXERCISE PROGRAM: Access Code: [email or web address] URL: [email or web address] Date: 12/16/2023 Prepared by: Richard [name]  Exercises - Seated Gaze Stabilization with Head Nod  - 2 x daily - 7 x weekly - 3 sets - 30 reps - Seated Gaze Stabilization with Head Rotation  - 2 x daily - 7 x weekly - 3 sets - 30 reps - Seated VOR Cancellation  - 2 x daily - 7 x weekly - 3 sets - 15 reps - Pencil Pushups  - 1 x daily - 7 x weekly - 3 sets - 15 reps  GOALS: Goals reviewed with patient? No   LONG TERM GOALS: Target date: 01/20/24  Reduced DHI score to indicate improved self reported disability.  Baseline:  Goal status: INITIAL  2.  Independence with HEP for visual accomodation training.  Baseline:  Goal status:  INITIAL  ASSESSMENT:  CLINICAL IMPRESSION: Screening orthostatics again, mild, asymptomatic. Pt has no symptoms or difficulty in exercises during session. Pt still having frequent, non mechanically provoked episodes of short duration, difficult to attribute these to either a unilateral hypofunction or hypotension etiology, continue to defer to neurology/cardiology for definitive diagnosis and management. Reviewed HEP again today, then pt should be prepared for DC to independent HEP performance by next visit.  Pt will benefit from skilled physical therapy intervention to reduce deficits and impairments identified in evaluation, in order to reduce pain, improve quality of life, and maximize activity tolerance for ADL, IADL, and leisure/fitness. Physical therapy will help pt achieve long and short term goals of care.    OBJECTIVE IMPAIRMENTS: Decreased knowledge of condition, decreased use of DME, decreased mobility, difficulty walking, decreased strength, decreased ROM. ACTIVITY LIMITATIONS: Lifting, standing, walking, squatting, transfers, locomotion level PARTICIPATION LIMITATIONS: Cleaning, laundry, interpersonal relationships, driving, yardwork, community activity.  PERSONAL FACTORS: Age, behavior pattern, education, past/current experiences, transportation, profession  are also affecting patient's functional outcome.  REHAB POTENTIAL: Good CLINICAL DECISION MAKING: Medium  EVALUATION COMPLEXITY: Moderate    PLAN:  PT FREQUENCY: 1-2x/week  PT DURATION: 6 weeks  PLANNED INTERVENTIONS: 97110-Therapeutic exercises, 97530- Therapeutic activity, [id number]- Neuromuscular re-education, 97535- Self Care, [id number]- Manual therapy, Patient/Family education, Vestibular training, and Visual/preceptual remediation/compensation  PLAN FOR NEXT SESSION: final HEP update/review and  DC    8:55 AM, 12/23/23 Richard [name], PT, DPT Physical Therapist - [facility]   Outpatient Physical Therapy- Main Campus [phone]     [place] C, PT 12/23/2023, 8:55 AM

## 2023-12-31 ENCOUNTER — Ambulatory Visit

## 2024-01-07 ENCOUNTER — Ambulatory Visit

## 2024-01-12 ENCOUNTER — Encounter: Payer: Self-pay | Admitting: Internal Medicine

## 2024-01-12 ENCOUNTER — Ambulatory Visit: Payer: Self-pay

## 2024-01-12 NOTE — Progress Notes (Unsigned)
 "  Subjective:    Patient ID: Richard [name], male    DOB: 07/23/1960, 63 y.o.   MRN: [id number]  HPI  Discussed the use of AI scribe software for clinical note transcription with the patient, who gave verbal consent to proceed.  Richard [name] is a 63 year old male who presents with rib pain following a fall.  He fell in his yard approximately five days ago after slipping over a drop cord while standing with his hands in his pockets. He landed on his right side, impacting his ribs and face, and also injured his ankle. The ankle has improved significantly, but he continues to experience rib pain.  The rib pain is located on the right side, with difficulty coughing and breathing deeply due to the pain. He notes some bruising in the rib area, which has lessened over time. He has taken Tylenol  OTC with some relief of symptoms.  He ran out of his tramadol  which she takes for osteoarthritis.SABRA He requests a refill of tramadol , which he has used previously.  He reports ongoing issues with his head, which have been suggested to be severe migraines and a touch of vertigo.  He is taking Topamax  as prescribed and following with neurology.        Review of Systems     Past Medical History:  Diagnosis Date   Arthritis    Arthrofibrosis of total knee arthroplasty 05/30/2015   Bilateral pulmonary embolism (HCC)    a. 03/2015 CTA: acute bilat PE; b. IVC filter placed in 2017--> removed 2018.   Chest pain    a. 2015 Abnl MV;  b. 2015 Cath: nl cors.   Chronic diastolic CHF (congestive heart failure) (HCC)    a. 03/2015 Echo: EF 55-65%, poor windows, mildly dil RV.   DVT (deep venous thrombosis) (HCC)    a. 03/2015 U/S: occlusive DVT w/in the distal aspect of the Left fem vein through the L popliteal vein.   Dyspnea    On exertion   GERD (gastroesophageal reflux disease)    Headache    Hypertension    Hypertensive heart disease    Obesity    OSA (obstructive sleep apnea)    uses CPAP nightly  settings at 15    PAF (paroxysmal atrial fibrillation) (HCC)    Pneumonia    hx of years ago    Primary localized osteoarthritis of left knee    a. 11/2014 s/p L TKA.   Primary localized osteoarthritis of right knee    a. 02/2014 s/p R Partial Knee arthroplasty.   Stiffness of left knee    a. 12/2014 s/p manipulation under anesthesia.   Urine incontinence     Current Outpatient Medications  Medication Sig Dispense Refill   allopurinol  (ZYLOPRIM ) 100 MG tablet TAKE ONE TABLET (100 MG TOTAL) BY MOUTH DAILY. 90 tablet 1   baclofen  (LIORESAL ) 10 MG tablet TAKE ONE TABLET (10 MG TOTAL) BY MOUTH TWO TIMES DAILY. 180 tablet 1   colchicine  0.6 MG tablet TAKE ONE TABLET BY MOUTH TWICE A DAY 180 tablet 0   cyanocobalamin  (VITAMIN B12) 1000 MCG/ML injection INJECT 1 ML (1,000 MCG TOTAL) INTO THE SKIN EVERY 30 DAYS. 3 mL 0   ELIQUIS  5 MG TABS tablet TAKE ONE TABLET BY MOUTH TWO TIMES DAILY 180 tablet 1   esomeprazole (NEXIUM) 40 MG capsule Take by mouth.     flecainide  (TAMBOCOR ) 100 MG tablet TAKE ONE TABLET BY MOUTH TWICE DAILY 180 tablet 2  furosemide  (LASIX ) 20 MG tablet TAKE ONE TABLET (20 MG TOTAL) BY MOUTH DAILY AS NEEDED. 90 tablet 2   Galcanezumab-gnlm 120 MG/ML SOAJ Inject 120 mg into the skin.     pantoprazole  (PROTONIX ) 40 MG tablet TAKE ONE TABLET BY MOUTH ONCE A DAY 90 tablet 1   Syringe, Disposable, 3 ML MISC 1 Device by Does not apply route every 30 (thirty) days. 25 each 0   Syringe/Needle, Disp, 25G X 1 1 ML MISC Use for B12 injection 1 mL per dose. First 4 weeks, inject 1 dose weekly. Next 4 months injection one dose monthly. 8 each 0   traMADol  (ULTRAM ) 50 MG tablet TAKE 1 TABLET (50 MG TOTAL) BY MOUTH DAILY AS NEEDED. 30 tablet 0   No current facility-administered medications for this visit.    Allergies  Allergen Reactions   Penicillins Anaphylaxis and Other (See Comments)    From childhood; uncertain of reaction Has patient had a PCN reaction causing immediate rash,  facial/tongue/throat swelling, SOB or lightheadedness with hypotension: Unk Has patient had a PCN reaction causing severe rash involving mucus membranes or skin necrosis: Unk Has patient had a PCN reaction that required hospitalization: Unk Has patient had a PCN reaction occurring within the last 10 years: No If all of the above answers are NO, then may proceed with Cephalosporin use.   Vancomycin  Other (See Comments)    Red Mans' syndrome   Penicillins Other (See Comments)    Childhood   Vancomycin  Other (See Comments)    Red man Syndrome    Family History  Problem Relation Age of Onset   Coronary artery disease Mother    Hyperlipidemia Mother    Hypertension Mother    Arthritis Mother    Coronary artery disease Father    Heart disease Paternal Grandmother    Alzheimer's disease Paternal Grandfather     Social History   Socioeconomic History   Marital status: Married    Spouse name: Richard [name]   Number of children: 2   Years of education: 12   Highest education level: GED or equivalent  Occupational History   Occupation: Acupuncturist: Engineer, [employer]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) [employer]  Tobacco Use   Smoking status: Never   Smokeless tobacco: Never  Vaping Use   Vaping status: Never Used  Substance and Sexual Activity   Alcohol use: Not Currently    Comment: \   Drug use: No   Sexual activity: Yes  Other Topics Concern   Not on file  Social History Narrative   ** Merged History Encounter **       Social Drivers of Health   Tobacco Use: Low Risk (11/07/2023)   Patient History    Smoking Tobacco Use: Never    Smokeless Tobacco Use: Never    Passive Exposure: Not on file  Financial Resource Strain: Low Risk (11/06/2023)   Overall Financial Resource Strain (CARDIA)    Difficulty of Paying Living Expenses: Not hard at all  Food Insecurity: Food Insecurity Present (11/06/2023)   Epic    Worried About Programme Researcher, Broadcasting/film/video in the Last Year: Never true    Ran Out of Food in the Last Year:  Sometimes true  Transportation Needs: No Transportation Needs (11/06/2023)   Epic    Lack of Transportation (Medical): No    Lack of Transportation (Non-Medical): No  Physical Activity: Insufficiently Active (11/06/2023)   Exercise Vital Sign    Days of Exercise per Week: 3 days    Minutes  of Exercise per Session: 10 min  Stress: No Stress Concern Present (11/06/2023)   [employer] of Occupational Health - Occupational Stress Questionnaire    Feeling of Stress: Not at all  Social Connections: Moderately Integrated (11/06/2023)   Social Connection and Isolation Panel    Frequency of Communication with Friends and Family: More than three times a week    Frequency of Social Gatherings with Friends and Family: More than three times a week    Attends Religious Services: More than 4 times per year    Active Member of Clubs or Organizations: No    Attends Banker Meetings: Not on file    Marital Status: Married  Intimate Partner Violence: Not At Risk (08/14/2023)   Epic    Fear of Current or Ex-Partner: No    Emotionally Abused: No    Physically Abused: No    Sexually Abused: No  Depression (PHQ2-9): Low Risk (08/14/2023)   Depression (PHQ2-9)    PHQ-2 Score: 0  Alcohol Screen: Low Risk (08/14/2023)   Alcohol Screen    Last Alcohol Screening Score (AUDIT): 0  Housing: Low Risk (11/06/2023)   Epic    Unable to Pay for Housing in the Last Year: No    Number of Times Moved in the Last Year: 0    Homeless in the Last Year: No  Utilities: Not At Risk (08/14/2023)   Epic    Threatened with loss of utilities: No  Health Literacy: Adequate Health Literacy (08/14/2023)   B1300 Health Literacy    Frequency of need for help with medical instructions: Never     Constitutional: Pt reports intermittent headaches. Denies fever, malaise, fatigue, or abrupt weight changes.  HEENT: Patient reports vision changes.  Denies eye pain, eye redness, ear pain, ringing in the ears, wax  buildup, runny nose, nasal congestion, bloody nose, or sore throat. Respiratory: Patient reports pain with deep breathing.  Denies difficulty breathing, shortness of breath, cough or sputum production.   Cardiovascular: Denies chest pain, chest tightness, palpitations or swelling in the hands or feet.  Gastrointestinal: Pt reports intermittent reflux. Denies bloating, constipation, diarrhea or blood in the stool.  Musculoskeletal: Patient reports right-sided rib pain.  Denies decrease in range of motion, difficulty with gait, or joint swelling.  Skin:  Denies redness, rashes, lesions or ulcercations.  Neurological: Patient reports intermittent dizziness.  Denies difficulty with memory, difficulty with speech or problems with balance and coordination.   No other specific complaints in a complete review of systems (except as listed in HPI above).  Objective:   Physical Exam  BP 136/80 (BP Location: Left Arm, Patient Position: Sitting, Cuff Size: Large)   Pulse 64   Ht 5' 9 (1.753 m)   Wt 257 lb 8 oz (116.8 kg)   SpO2 95%   BMI 38.03 kg/m     Wt Readings from Last 3 Encounters:  11/07/23 254 lb (115.2 kg)  09/02/23 246 lb 9.6 oz (111.9 kg)  08/18/23 249 lb 6.4 oz (113.1 kg)    General: Appears his stated age, obese, in NAD. Skin: Warm, dry and intact.  No bruising or abrasion noted of the right anterior ribs. HEENT: Head: normal shape and size; Eyes: sclera white, no icterus, conjunctiva pink, PERRLA and EOMs intact;  Cardiovascular: Normal rate and rhythm. S1,S2 noted.  No murmur, rubs or gallops noted.  Pulmonary/Chest: Normal effort and positive vesicular breath sounds. No respiratory distress. No wheezes, rales or ronchi noted.   Musculoskeletal: Pain with  palpation of the lower anterior ribs.  No discrete step-off or deformity noted.  No difficulty with gait.  Neurological: Alert and oriented.    BMET    Component Value Date/Time   NA 141 11/07/2023 0859   K 4.2  11/07/2023 0859   CL 103 11/07/2023 0859   CO2 24 11/07/2023 0859   GLUCOSE 82 11/07/2023 0859   GLUCOSE 87 09/02/2023 0952   BUN 16 11/07/2023 0859   CREATININE 1.36 (H) 11/07/2023 0859   CREATININE 1.40 (H) 09/02/2023 0952   CALCIUM 9.1 11/07/2023 0859   GFRNONAA 58 (L) 12/12/2022 0437   GFRAA >60 10/19/2019 1337   GFRAA >60 10/19/2019 1337    Lipid Panel     Component Value Date/Time   CHOL 180 08/18/2023 1324   CHOL 143 02/05/2021 0909   TRIG 192 (H) 08/18/2023 1324   HDL 42 08/18/2023 1324   HDL 48 02/05/2021 0909   CHOLHDL 4.3 08/18/2023 1324   VLDL 22.4 01/17/2020 1214   LDLCALC 107 (H) 08/18/2023 1324    CBC    Component Value Date/Time   WBC 8.6 08/18/2023 1324   RBC 4.49 08/18/2023 1324   HGB 14.0 08/18/2023 1324   HGB 16.2 02/05/2021 0909   HCT 41.9 08/18/2023 1324   HCT 45.5 02/05/2021 0909   PLT 198 08/18/2023 1324   PLT 195 02/05/2021 0909   MCV 93.3 08/18/2023 1324   MCV 88 02/05/2021 0909   MCH 31.2 08/18/2023 1324   MCHC 33.4 08/18/2023 1324   RDW 13.2 08/18/2023 1324   RDW 12.6 02/05/2021 0909   LYMPHSABS 2.1 12/12/2022 0437   LYMPHSABS 1.6 10/31/2017 1438   MONOABS 0.7 12/12/2022 0437   EOSABS 0.3 12/12/2022 0437   EOSABS 0.2 10/31/2017 1438   BASOSABS 0.1 12/12/2022 0437   BASOSABS 0.0 10/31/2017 1438    Hgb A1C Lab Results  Component Value Date   HGBA1C 5.4 08/18/2023          Assessment & Plan:  Assessment and Plan    Right rib injury with pain status post fall due to tripping Right rib injury from fall 5-6 days ago. Pain with coughing and deep breathing suggests possible rib fractures. Bruising improving. Differential includes rib fractures with risk of pneumonia. - Ordered x-ray of right ribs to assess for fractures. - Refilled tramadol  50 mg daily as needed for pain management. - Advised on pain management techniques during coughing, such as splinting or using a pillow. - Encouraged deep breathing to avoid  pneumonia  Ocular migraines with vertigo Remains symptomatic despite Topamax  -He will continue to follow with neurology     RTC in 1 months, follow-up chronic conditions Angeline Laura, NP  "

## 2024-01-12 NOTE — Telephone Encounter (Signed)
 FYI Only or Action Required?: FYI only for provider: appointment scheduled on 01/13/24.  Patient was last seen in primary care on 09/02/2023 by Antonette Angeline ORN, NP.  Called Nurse Triage reporting Chest Injury.  Symptoms began several days ago.  Interventions attempted: Rest, hydration, or home remedies.  Symptoms are: gradually worsening.  Triage Disposition: See Physician Within 24 Hours  Patient/caregiver understands and will follow disposition?:   Copied from CRM #[id number]. Topic: Clinical - Red Word Triage >> Jan 12, 2024 11:52 AM Sophia H wrote: Red Word that prompted transfer to Nurse Triage: Patient states he fell in the yard a few days ago - whole right side, stomach and chest are hurting but doesn't want to go to the ER. States he is having a hard time coughing since, feels like a knife in my stomach. Reason for Disposition  [1] MODERATE pain (e.g., interferes with normal activities) AND [2] high-risk adult (e.g., age > 60 years, osteoporosis, chronic steroid use)  Answer Assessment - Initial Assessment Questions 1. MECHANISM: How did the injury happen?     Tripped and fell 2. ONSET: When did the injury happen? (.e.g., minutes, hours, days ago)     Few days ago 3. LOCATION: Where on the chest is the injury located?     Rt side 4. APPEARANCE: What does the injury look like?     normal 5. BLEEDING: Is there any bleeding now? If Yes, ask: How long has it been bleeding?     no 6. SEVERITY: Any difficulty with breathing?     denies 7. SIZE: For cuts, bruises, or swelling, ask: How large is it? (e.g., inches or centimeters)     N/a 8. PAIN: Is there pain? If Yes, ask: How bad is the pain? (e.g., Scale 0-10; none, mild, moderate, severe)     Mild, worsen with movement 9. TETANUS: For any breaks in the skin, ask: When was your last tetanus booster?  Protocols used: Chest Injury-A-AH

## 2024-01-12 NOTE — Telephone Encounter (Signed)
 Will discuss at upcoming appointment tomorrow

## 2024-01-13 ENCOUNTER — Ambulatory Visit (INDEPENDENT_AMBULATORY_CARE_PROVIDER_SITE_OTHER): Admitting: Internal Medicine

## 2024-01-13 ENCOUNTER — Ambulatory Visit
Admission: RE | Admit: 2024-01-13 | Discharge: 2024-01-13 | Disposition: A | Discharge status: Home / Self Care | Source: Ambulatory Visit | Attending: Internal Medicine | Admitting: Internal Medicine

## 2024-01-13 ENCOUNTER — Encounter: Payer: Self-pay | Admitting: Internal Medicine

## 2024-01-13 ENCOUNTER — Ambulatory Visit: Payer: Self-pay | Admitting: Internal Medicine

## 2024-01-13 VITALS — BP 136/80 | HR 64 | Ht 69.0 in | Wt 257.5 lb

## 2024-01-13 DIAGNOSIS — M150 Primary generalized (osteo)arthritis: Secondary | ICD-10-CM

## 2024-01-13 DIAGNOSIS — W010XXA Fall on same level from slipping, tripping and stumbling without subsequent striking against object, initial encounter: Secondary | ICD-10-CM

## 2024-01-13 DIAGNOSIS — R0789 Other chest pain: Secondary | ICD-10-CM | POA: Diagnosis not present

## 2024-01-13 DIAGNOSIS — Z23 Encounter for immunization: Secondary | ICD-10-CM

## 2024-01-13 MED ORDER — TRAMADOL HCL 50 MG PO TABS: 50.0000 mg | ORAL_TABLET | Freq: Every day | ORAL | 0 refills | Status: DC | PRN

## 2024-01-13 NOTE — Patient Instructions (Signed)
Chest Wall Pain Chest wall pain is pain in or around the bones and muscles of your chest. Chest wall pain may be caused by: An injury. Coughing a lot. Using your chest and arm muscles too much. Sometimes, the cause may not be known. This pain may take a few [place] or longer to get better. Follow these instructions at home: Managing pain, stiffness, and swelling If told, put ice on the painful area: Put ice in a plastic bag. Place a towel between your skin and the bag. Leave the ice on for 20 minutes, 2-3 times a day.  Activity Rest as told by your doctor. Avoid doing things that cause pain. This includes lifting heavy items. Ask your doctor what activities are safe for you. General instructions  Take over-the-counter and prescription medicines only as told by your doctor. Do not use any products that contain nicotine or tobacco, such as cigarettes, e-cigarettes, and chewing tobacco. If you need help quitting, ask your doctor. Keep all follow-up visits as told by your doctor. This is important. Contact a doctor if: You have a fever. Your chest pain gets worse. You have new symptoms. Get help right away if: You feel sick to your stomach (nauseous) or you throw up (vomit). You feel sweaty or light-headed. You have a cough with mucus from your lungs (sputum) or you cough up blood. You are short of breath. These symptoms may be an emergency. Do not wait to see if the symptoms will go away. Get medical help right away. Call your local emergency services (911 in the [place] Do not drive yourself to the hospital. Summary Chest wall pain is pain in or around the bones and muscles of your chest. It may be treated with ice, rest, and medicines. Your condition may also get better if you avoid doing things that cause pain. Contact a doctor if you have a fever, chest pain that gets worse, or new symptoms. Get help right away if you feel light-headed or you get short of breath. These symptoms may  be an emergency. This information is not intended to replace advice given to you by your health care provider. Make sure you discuss any questions you have with your health care provider. Document Revised: 12/31/2021 Document Reviewed: 12/31/2021 Elsevier Patient Education  2024 [employer].

## 2024-01-20 ENCOUNTER — Other Ambulatory Visit: Payer: Self-pay | Admitting: Internal Medicine

## 2024-01-20 DIAGNOSIS — M150 Primary generalized (osteo)arthritis: Secondary | ICD-10-CM

## 2024-01-21 ENCOUNTER — Ambulatory Visit

## 2024-01-21 NOTE — Telephone Encounter (Signed)
 Requested medication (s) are due for refill today: Yes  Requested medication (s) are on the active medication list: Yes  Last refill:  01/13/24  Future visit scheduled: Yes  Notes to clinic:  Unable to refuse due to non-delegated medicine. Routing to office for refusal.       Requested Prescriptions  Pending Prescriptions Disp Refills   traMADol  (ULTRAM ) 50 MG tablet [Pharmacy Med Name: TRAMADOL  HYDROCHLORIDE 50MG  TABLET] 30 tablet 0    Sig: Take 1 tablet (50 mg total) by mouth daily as needed.     Not Delegated - Analgesics:  Opioid Agonists Failed - 01/21/2024 12:01 PM      Failed - This refill cannot be delegated      Failed - Urine Drug Screen completed in last 360 days      Failed - Valid encounter within last 3 months    Recent Outpatient Visits           1 week ago Rib pain on right side   [facility] [place], Angeline ORN, NP   4 months ago B12 deficiency   [facility] [place], Angeline ORN, NP   5 months ago Encounter for general adult medical examination with abnormal findings   [facility] [place], Angeline ORN, NP   5 months ago Orthostatic hypotension   [place] [place], Angeline ORN, NP   6 months ago Cellulitis of right upper extremity   [facility] [place], Angeline ORN, NP       Future Appointments             In 4 weeks Loistine, Barnie, NP [facility]

## 2024-01-28 ENCOUNTER — Ambulatory Visit

## 2024-02-03 ENCOUNTER — Ambulatory Visit

## 2024-02-10 ENCOUNTER — Ambulatory Visit

## 2024-02-13 NOTE — Progress Notes (Signed)
 "  Cardiology Clinic Note   Date: 02/19/2024 ID: NOCHOLAS DAMASO, DOB 14-May-1960, MRN [id number]  Primary Cardiologist:  Deatrice Cage, MD  Chief Complaint   Richard [name] is a 64 y.o. male who presents to the clinic today for routine follow up.   Patient Profile   Richard [name] is followed by Dr. Cage for the history outlined below.       Past medical history significant for: Chronic HFpEF. Echo 12/11/2022: EF 60 to 65%.  No RWMA.  Grade I DD.  Normal RV size/function.  Normal PA pressure, RVSP 18.1 mmHg.  Mild AI.  Negative bubble study with no evidence of interatrial shunt. PAF. 7 day Zio 11/24/2023: HR 39-146 bpm, average HR 65 bpm. Predominately sinus rhythm. 452 runs of SVT fastest 5 beats max rate 146 bpm, longest 10 beats average rate 106 bpm. Frequent PACs 15% burden.  Hypertension. TIA. OSA. On CPAP. GERD. PE/DVT. CKD. Obesity. S/p gastric sleeve October 2021.  In summary, patient has a history of abnormal stress test in 2015 which led to a cardiac catheterization showing normal coronary arteries.  He underwent right partial knee arthroplasty in February 2016 followed by a left total knee arthroplasty in November 2016.  His mobility was limited and he had the flu in February 2017.  In early March 2017 he presented with bilateral PEs.  Lower extremity ultrasound demonstrated DVT.  Echo at that time showed normal LV function with mildly dilated right ventricle.  He was found to be in A-fib and was treated with short-term amiodarone .  He underwent catheter-based intervention for his pulmonary embolism as well as IVC filter placement.  Amiodarone  was discontinued in June 2017 and upon follow-up in September 2017 he had not had any further arrhythmias.  It was recommended he continue lifelong anticoagulation given his PE burden as well as continued immobility.  From an A-fib standpoint he has been maintained on flecainide  and Eliquis .  He had recurrent A-fib in December 2018  for which she was seen in the ER and treated with IV metoprolol .  He underwent gastric sleeve in October 2021 and subsequently lost 80 pounds which improved his overall health and activity tolerance.  In 2022 he had issues with bradycardia with heart rate in the high 40s and mild dizziness.  Metoprolol  was discontinued.  In November 2024 he was hospitalized for blurred vision.  CTA of the neck showed no significant obstructive disease.  MRI of the brain with no abnormalities.  He was referred to ENT and neurology.  Echo at that time demonstrated normal LV/RV function.  Neurology suspected he was having ocular migraines.  In April 2025 he was noted to have elevated blood pressure and was started on olmesartan .  Patient was last seen in the office by Dr. Cage on 07/15/2023 for routine follow-up.  She complained of orthostatic dizziness and was found to be mildly hypotensive at 90/70.  He was instructed to take half tablet of olmesartan  daily.   Patient was last seen in the office by me on 10/17/205 at the request of neurology. Patient was unsure why he was being seen. He denies cardiac symptoms. He reported cardiac awareness of afib secondary to nervous/jittery feeling. He reported continued episodes of nystagmus and blurred vision but no headaches. He reported neurology wanted him to see nephrology for declining kidney function. He was uncertain what medications he was taking but did not think he was taking olmesartan  at all. He was normotensive at that time of his  visit.      History of Present Illness    Today, patient is here alone. He is doing well. Since his last visit he was evaluated by nephrology and his kidney function is improving. Patient denies shortness of breath, dyspnea on exertion, orthopnea or PND. He gets occasional L>R lower extremity edema. Daily weight has been stable. He has not needed Lasix  for a year or more. He sleeps with a CPAP nightly. No chest pain, pressure, or tightness. He  reports occasional palpitations that are typically brief. If he goes into afib he will get a nervous/jittery feeling. He is working on losing weight by limiting portions. He continues to have blurred vision and balance issues and was being seen by neurology but has been displeased with his care so he is pending referral to a different neurologist.      ROS: All other systems reviewed and are otherwise negative except as noted in History of Present Illness.  EKGs/Labs Reviewed       EKG is not performed today.   08/18/2023: ALT 35; AST 27 11/07/2023: BUN 16; Creatinine, Ser 1.36; Potassium 4.2; Sodium 141   08/18/2023: Hemoglobin 14.0; WBC 8.6    Risk Assessment/Calculations     CHA2DS2-VASc Score = 4   This indicates a 4.8% annual risk of stroke. The patient's score is based upon: CHF History: 1 HTN History: 1 Diabetes History: 0 Stroke History: 2 Vascular Disease History: 0 Age Score: 0 Gender Score: 0             Physical Exam    VS:  BP 132/72 (BP Location: Left Arm, Patient Position: Sitting, Cuff Size: Large)   Pulse 65   Ht 5' 9 (1.753 m)   Wt 258 lb (117 kg)   SpO2 96%   BMI 38.10 kg/m  , BMI Body mass index is 38.1 kg/m.  GEN: Well nourished, well developed, in no acute distress. Neck: No JVD or carotid bruits. Cardiac:  RRR.  No murmur. No rubs or gallops.   Respiratory:  Respirations regular and unlabored. Clear to auscultation without rales, wheezing or rhonchi. GI: Soft, nontender, nondistended. Extremities: Radials/DP/PT 2+ and equal bilaterally. No clubbing or cyanosis. Mild, nonpitting L>R lower extremity edema.   Skin: Warm and dry, no rash. Neuro: Strength intact.  Assessment & Plan   Chronic HFpEF Echo November 2024 demonstrated normal LV/RV function, Grade I DD, normal PA pressure, mild AI, negative bubble study.  Patient reports occasional L>R lower extremity edema. Daily weight is stable. No shortness of breath, DOE, orthopnea or PND.  Mild, nonpitting lower extremity edema L>R today. Euvolemic and well compensated on exam. - Continue Lasix  as needed.    PAF Onset 2017.  7 day Zio November 2025 demonstrated HR 39-146 bpm, average 65 bpm, 452 runs of SVT fastest 5 beats max rate 146 bpm, longest 10 beats average rate 106 bpm, frequent PACs 15% burden. Denies spontaneous bleeding concerns.  Patient reports occasional brief palpitations. He gets nervous/jittery with episodes of afib. He does not feel he has gone into afib recently. RRR on exam today. Discussed the importance of concurrent BB with flecainide . He is agreeable to starting Toprol .  - Add Toprol  12.5 mg daily to be taken in the mornings. He will contact the office if he experiences bradycardia.  - Continue flecainide  and Eliquis .   Hypertension BP today 132/72. He was being followed by neurology for ocular migraines. He denies headaches but is experiencing episodes of imbalance and blurred vision. He  is pending referral to a different neurologist. He is not on anti-hypertensives secondary to hypotension in the past.  - Add Toprol  (see above).   OSA Patient reports adherence to CPAP.  - Continue use of CPAP.   Disposition: Add Toprol  12.5 mg daily. Return in 6 months or sooner as needed.          Signed, Richard HERO. Nefi Musich, DNP, NP-C  "

## 2024-02-16 ENCOUNTER — Other Ambulatory Visit: Payer: Self-pay | Admitting: Internal Medicine

## 2024-02-16 ENCOUNTER — Ambulatory Visit: Admitting: Internal Medicine

## 2024-02-16 DIAGNOSIS — M150 Primary generalized (osteo)arthritis: Secondary | ICD-10-CM

## 2024-02-17 ENCOUNTER — Ambulatory Visit

## 2024-02-17 NOTE — Telephone Encounter (Signed)
 Requested medications are due for refill today.  yes  Requested medications are on the active medications list.  yes  Last refill. 01/13/2024 #30 0 rf  Future visit scheduled.   yes  Notes to clinic.  Refill not delegated    Requested Prescriptions  Pending Prescriptions Disp Refills   traMADol  (ULTRAM ) 50 MG tablet [Pharmacy Med Name: TRAMADOL  HYDROCHLORIDE 50MG  TABLET] 30 tablet 0    Sig: TAKE 1 TABLET (50 MG) BY MOUTH DAILY AS NEEDED.     Not Delegated - Analgesics:  Opioid Agonists Failed - 02/17/2024  3:00 PM      Failed - This refill cannot be delegated      Failed - Urine Drug Screen completed in last 360 days      Failed - Valid encounter within last 3 months    Recent Outpatient Visits           1 month ago Rib pain on right side   [place] [facility], Angeline ORN, NP   5 months ago B12 deficiency   [place] [facility] [place], Angeline ORN, NP   6 months ago Encounter for general adult medical examination with abnormal findings   [place] [facility], Angeline ORN, NP   6 months ago Orthostatic hypotension   [place] [facility] [place], Angeline ORN, NP   7 months ago Cellulitis of right upper extremity   [place] [facility] [place], Angeline ORN, NP       Future Appointments             In 2 days Loistine Sober, NP [facility]

## 2024-02-18 ENCOUNTER — Ambulatory Visit: Admitting: Internal Medicine

## 2024-02-18 ENCOUNTER — Ambulatory Visit: Payer: Self-pay

## 2024-02-18 ENCOUNTER — Encounter: Payer: Self-pay | Admitting: Internal Medicine

## 2024-02-18 VITALS — BP 138/80 | Ht 69.0 in | Wt 260.8 lb

## 2024-02-18 DIAGNOSIS — N1831 Chronic kidney disease, stage 3a: Secondary | ICD-10-CM | POA: Diagnosis not present

## 2024-02-18 DIAGNOSIS — I480 Paroxysmal atrial fibrillation: Secondary | ICD-10-CM | POA: Diagnosis not present

## 2024-02-18 DIAGNOSIS — M150 Primary generalized (osteo)arthritis: Secondary | ICD-10-CM

## 2024-02-18 DIAGNOSIS — N183 Chronic kidney disease, stage 3 unspecified: Secondary | ICD-10-CM | POA: Insufficient documentation

## 2024-02-18 DIAGNOSIS — Z86711 Personal history of pulmonary embolism: Secondary | ICD-10-CM

## 2024-02-18 DIAGNOSIS — G4733 Obstructive sleep apnea (adult) (pediatric): Secondary | ICD-10-CM

## 2024-02-18 DIAGNOSIS — G43109 Migraine with aura, not intractable, without status migrainosus: Secondary | ICD-10-CM | POA: Diagnosis not present

## 2024-02-18 DIAGNOSIS — I10 Essential (primary) hypertension: Secondary | ICD-10-CM | POA: Diagnosis not present

## 2024-02-18 DIAGNOSIS — Z86718 Personal history of other venous thrombosis and embolism: Secondary | ICD-10-CM | POA: Diagnosis not present

## 2024-02-18 DIAGNOSIS — Z8673 Personal history of transient ischemic attack (TIA), and cerebral infarction without residual deficits: Secondary | ICD-10-CM | POA: Diagnosis not present

## 2024-02-18 DIAGNOSIS — K219 Gastro-esophageal reflux disease without esophagitis: Secondary | ICD-10-CM

## 2024-02-18 DIAGNOSIS — M1A0790 Idiopathic chronic gout, unspecified ankle and foot, without tophus (tophi): Secondary | ICD-10-CM

## 2024-02-18 DIAGNOSIS — I5032 Chronic diastolic (congestive) heart failure: Secondary | ICD-10-CM

## 2024-02-18 NOTE — Assessment & Plan Note (Signed)
Kidney function reviewed Will monitor

## 2024-02-18 NOTE — Progress Notes (Signed)
 "  Subjective:    Patient ID: Richard [name], male    DOB: 1960/04/22, 64 y.o.   MRN: [id number]  HPI  Patient presents to clinic today for 76-month follow-up of chronic conditions.  OA: Generalized.  Managed with baclofen  and tramadol  as needed with good relief of symptoms.  He follows with orthopedics.  History of DVT/PE: On lifelong apixaban .  He no longer follows with hematology.  CHF, diastolic: He denies chronic cough, shortness of breath or lower extremity edema.  He is taking flecainide  as prescribed but takes furosemide  only as needed.  Echo from 11/2022 reviewed.  He follows with cardiology.  GERD: Triggered by tomato-based foods.  He denies breakthrough on pantoprazole .  There is no upper GI on file.  HTN: His BP today is 106/82.  He is taking flecainide  as prescribed and furosemide  as needed.  ECG from 10/2023 reviewed.  OSA: He averages 7 hours of sleep per night with the use of his CPAP.  There is no sleep study on file.  A-fib: Paroxysmal, managed on flecainide  and apixaban .  ECG from 01/2022 reviewed.  Gout: He reports he has had a few recent flares.  He is taking allopurinol  as prescribed and has colchicine  to take as needed.  He does not follow with rheumatology.  Ocular migraines: These occur 2 x week.  Associated with vision loss and dizziness.  He is taking galcanezumab as prescribed. He has been on topirimate in the past.  MRI brain from 11/2022 reviewed.  He is following with neurology but would like a referral for a second opinion.  CKD 3: His last creatinine was 1.39, GFR 59, 10/2023.  He is not currently on an ACEI/ARB.  He does not follow with nephrology.  HLD with history of TIA: His last LDL was 107, triglycerides 192, 07/2023. He is not taking any cholesterol lowering medication at this time.  He is taking apixaban  as prescribed.  He consumes a low fat diet.  Review of Systems  Past Medical History:  Diagnosis Date   Arthritis    Arthrofibrosis of total  knee arthroplasty 05/30/2015   Bilateral pulmonary embolism (HCC)    a. 03/2015 CTA: acute bilat PE; b. IVC filter placed in 2017--> removed 2018.   Chest pain    a. 2015 Abnl MV;  b. 2015 Cath: nl cors.   Chronic diastolic CHF (congestive heart failure) (HCC)    a. 03/2015 Echo: EF 55-65%, poor windows, mildly dil RV.   DVT (deep venous thrombosis) (HCC)    a. 03/2015 U/S: occlusive DVT w/in the distal aspect of the Left fem vein through the L popliteal vein.   Dyspnea    On exertion   GERD (gastroesophageal reflux disease)    Headache    Hypertension    Hypertensive heart disease    Obesity    OSA (obstructive sleep apnea)    uses CPAP nightly settings at 15    PAF (paroxysmal atrial fibrillation) (HCC)    Pneumonia    hx of years ago    Primary localized osteoarthritis of left knee    a. 11/2014 s/p L TKA.   Primary localized osteoarthritis of right knee    a. 02/2014 s/p R Partial Knee arthroplasty.   Stiffness of left knee    a. 12/2014 s/p manipulation under anesthesia.   Urine incontinence     Current Outpatient Medications  Medication Sig Dispense Refill   allopurinol  (ZYLOPRIM ) 100 MG tablet TAKE ONE TABLET (100 MG TOTAL) BY MOUTH  DAILY. 90 tablet 1   baclofen  (LIORESAL ) 10 MG tablet TAKE ONE TABLET (10 MG TOTAL) BY MOUTH TWO TIMES DAILY. 180 tablet 1   colchicine  0.6 MG tablet TAKE ONE TABLET BY MOUTH TWICE A DAY 180 tablet 0   cyanocobalamin  (VITAMIN B12) 1000 MCG/ML injection INJECT 1 ML (1,000 MCG TOTAL) INTO THE SKIN EVERY 30 DAYS. 3 mL 0   ELIQUIS  5 MG TABS tablet TAKE ONE TABLET BY MOUTH TWO TIMES DAILY 180 tablet 1   esomeprazole (NEXIUM) 40 MG capsule Take by mouth.     flecainide  (TAMBOCOR ) 100 MG tablet TAKE ONE TABLET BY MOUTH TWICE DAILY 180 tablet 2   furosemide  (LASIX ) 20 MG tablet TAKE ONE TABLET (20 MG TOTAL) BY MOUTH DAILY AS NEEDED. 90 tablet 2   Galcanezumab-gnlm 120 MG/ML SOAJ Inject 120 mg into the skin.     pantoprazole  (PROTONIX ) 40 MG tablet TAKE  ONE TABLET BY MOUTH ONCE A DAY 90 tablet 1   Syringe, Disposable, 3 ML MISC 1 Device by Does not apply route every 30 (thirty) days. 25 each 0   Syringe/Needle, Disp, 25G X 1 1 ML MISC Use for B12 injection 1 mL per dose. First 4 weeks, inject 1 dose weekly. Next 4 months injection one dose monthly. 8 each 0   topiramate  (TOPAMAX ) 25 MG tablet Take 25 mg by mouth daily.     traMADol  (ULTRAM ) 50 MG tablet Take 1 tablet (50 mg total) by mouth daily as needed. 30 tablet 0   No current facility-administered medications for this visit.    Allergies  Allergen Reactions   Penicillins Anaphylaxis and Other (See Comments)    From childhood; uncertain of reaction Has patient had a PCN reaction causing immediate rash, facial/tongue/throat swelling, SOB or lightheadedness with hypotension: Unk Has patient had a PCN reaction causing severe rash involving mucus membranes or skin necrosis: Unk Has patient had a PCN reaction that required hospitalization: Unk Has patient had a PCN reaction occurring within the last 10 years: No If all of the above answers are NO, then may proceed with Cephalosporin use.   Vancomycin  Other (See Comments)    Red Mans' syndrome   Penicillins Other (See Comments)    Childhood   Vancomycin  Other (See Comments)    Red man Syndrome    Family History  Problem Relation Age of Onset   Coronary artery disease Mother    Hyperlipidemia Mother    Hypertension Mother    Arthritis Mother    Coronary artery disease Father    Heart disease Paternal Grandmother    Alzheimer's disease Paternal Grandfather     Social History   Socioeconomic History   Marital status: Married    Spouse name: Hershall Benkert   Number of children: 2   Years of education: 12   Highest education level: GED or equivalent  Occupational History   Occupation: Acupuncturist: Engineer, [employer]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) [employer]  Tobacco Use   Smoking status: Never   Smokeless tobacco: Never  Vaping Use   Vaping status: Never Used   Substance and Sexual Activity   Alcohol use: Not Currently    Comment: \   Drug use: No   Sexual activity: Yes  Other Topics Concern   Not on file  Social History Narrative   ** Merged History Encounter **       Social Drivers of Health   Tobacco Use: Low Risk (01/13/2024)   Patient History    Smoking Tobacco Use: Never  Smokeless Tobacco Use: Never    Passive Exposure: Not on file  Financial Resource Strain: Low Risk (11/06/2023)   Overall Financial Resource Strain (CARDIA)    Difficulty of Paying Living Expenses: Not hard at all  Food Insecurity: Food Insecurity Present (11/06/2023)   Epic    Worried About Programme Researcher, Broadcasting/film/video in the Last Year: Never true    Ran Out of Food in the Last Year: Sometimes true  Transportation Needs: No Transportation Needs (11/06/2023)   Epic    Lack of Transportation (Medical): No    Lack of Transportation (Non-Medical): No  Physical Activity: Insufficiently Active (11/06/2023)   Exercise Vital Sign    Days of Exercise per Week: 3 days    Minutes of Exercise per Session: 10 min  Stress: No Stress Concern Present (11/06/2023)   [employer] of Occupational Health - Occupational Stress Questionnaire    Feeling of Stress: Not at all  Social Connections: Moderately Integrated (11/06/2023)   Social Connection and Isolation Panel    Frequency of Communication with Friends and Family: More than three times a week    Frequency of Social Gatherings with Friends and Family: More than three times a week    Attends Religious Services: More than 4 times per year    Active Member of Clubs or Organizations: No    Attends Banker Meetings: Not on file    Marital Status: Married  Intimate Partner Violence: Not At Risk (08/14/2023)   Epic    Fear of Current or Ex-Partner: No    Emotionally Abused: No    Physically Abused: No    Sexually Abused: No  Depression (PHQ2-9): Low Risk (01/13/2024)   Depression (PHQ2-9)    PHQ-2 Score: 0   Alcohol Screen: Low Risk (08/14/2023)   Alcohol Screen    Last Alcohol Screening Score (AUDIT): 0  Housing: Low Risk (11/06/2023)   Epic    Unable to Pay for Housing in the Last Year: No    Number of Times Moved in the Last Year: 0    Homeless in the Last Year: No  Utilities: Not At Risk (08/14/2023)   Epic    Threatened with loss of utilities: No  Health Literacy: Adequate Health Literacy (08/14/2023)   B1300 Health Literacy    Frequency of need for help with medical instructions: Never     Constitutional: Patient reports frequent headaches.  Denies fever, malaise, fatigue, or abrupt weight changes.  HEENT: Patient reports intermittent vision loss.  Denies eye pain, eye redness, ear pain, ringing in the ears, wax buildup, runny nose, nasal congestion, bloody nose, or sore throat. Respiratory: Denies difficulty breathing, shortness of breath, cough or sputum production.   Cardiovascular: Denies chest pain, chest tightness, palpitations or swelling in the hands or feet.  Gastrointestinal: Denies abdominal pain, bloating, constipation, diarrhea or blood in the stool.  GU: Denies urgency, frequency, pain with urination, burning sensation, blood in urine, odor or discharge. Musculoskeletal: Patient reports joint pain.  Denies decrease in range of motion, difficulty with gait, muscle pain or joint swelling.  Skin: Denies redness, rashes, lesions or ulcercations.  Neurological: Patient ports intermittent dizziness.  Denies difficulty with memory, difficulty with speech or problems with balance and coordination.  Psych: Denies anxiety, depression, SI/HI.  No other specific complaints in a complete review of systems (except as listed in HPI above).     Objective:   Physical Exam   There were no vitals taken for this visit.  Wt Readings  from Last 3 Encounters:  01/13/24 257 lb 8 oz (116.8 kg)  11/07/23 254 lb (115.2 kg)  09/02/23 246 lb 9.6 oz (111.9 kg)    General: Appears his  stated age, obese, in NAD. Skin: Warm, dry and intact.  HEENT: Head: normal shape and size; Eyes: sclera white, no icterus, conjunctiva pink, PERRLA and EOMs intact;  Cardiovascular: Bradycardic with normal rhythm. S1,S2 noted.  No murmur, rubs or gallops noted. No JVD or BLE edema. No carotid bruits noted. Pulmonary/Chest: Normal effort and positive vesicular breath sounds. No respiratory distress. No wheezes, rales or ronchi noted.  Abdomen: Soft and nontender. Normal bowel sounds.  Musculoskeletal: No signs of joint swelling.  No difficulty with gait.  Neurological: Alert and oriented. Coordination normal.  Psychiatric: Mood and affect normal. Behavior is normal. Judgment and thought content normal.     BMET    Component Value Date/Time   NA 141 11/07/2023 0859   K 4.2 11/07/2023 0859   CL 103 11/07/2023 0859   CO2 24 11/07/2023 0859   GLUCOSE 82 11/07/2023 0859   GLUCOSE 87 09/02/2023 0952   BUN 16 11/07/2023 0859   CREATININE 1.36 (H) 11/07/2023 0859   CREATININE 1.40 (H) 09/02/2023 0952   CALCIUM 9.1 11/07/2023 0859   GFRNONAA 58 (L) 12/12/2022 0437   GFRAA >60 10/19/2019 1337   GFRAA >60 10/19/2019 1337    Lipid Panel     Component Value Date/Time   CHOL 180 08/18/2023 1324   CHOL 143 02/05/2021 0909   TRIG 192 (H) 08/18/2023 1324   HDL 42 08/18/2023 1324   HDL 48 02/05/2021 0909   CHOLHDL 4.3 08/18/2023 1324   VLDL 22.4 01/17/2020 1214   LDLCALC 107 (H) 08/18/2023 1324    CBC    Component Value Date/Time   WBC 8.6 08/18/2023 1324   RBC 4.49 08/18/2023 1324   HGB 14.0 08/18/2023 1324   HGB 16.2 02/05/2021 0909   HCT 41.9 08/18/2023 1324   HCT 45.5 02/05/2021 0909   PLT 198 08/18/2023 1324   PLT 195 02/05/2021 0909   MCV 93.3 08/18/2023 1324   MCV 88 02/05/2021 0909   MCH 31.2 08/18/2023 1324   MCHC 33.4 08/18/2023 1324   RDW 13.2 08/18/2023 1324   RDW 12.6 02/05/2021 0909   LYMPHSABS 2.1 12/12/2022 0437   LYMPHSABS 1.6 10/31/2017 1438   MONOABS 0.7  12/12/2022 0437   EOSABS 0.3 12/12/2022 0437   EOSABS 0.2 10/31/2017 1438   BASOSABS 0.1 12/12/2022 0437   BASOSABS 0.0 10/31/2017 1438    Hgb A1C Lab Results  Component Value Date   HGBA1C 5.4 08/18/2023           Assessment & Plan:     RTC in 6 months for your annual exam Angeline Laura, NP  "

## 2024-02-18 NOTE — Assessment & Plan Note (Signed)
 Lipid profile reviewed Encouraged him to consume a low-fat diet Continue apixaban  5 mg twice daily

## 2024-02-18 NOTE — Assessment & Plan Note (Signed)
 Complicated by morbid obesity Compensated Reinforced DASH diet Monitor daily weights Continue furosemide  20 mg daily as needed Kidney function reviewed

## 2024-02-18 NOTE — Assessment & Plan Note (Signed)
 Complicated by morbid obesity Encourage weight loss as this can help reduce sleep apnea symptoms Continue CPAP use

## 2024-02-18 NOTE — Assessment & Plan Note (Signed)
 Complicated by morbid obesity Avoid foods that trigger reflux Encourage weight loss as this can help reduce reflux symptoms Continue pantoprazole  40 mg daily

## 2024-02-18 NOTE — Assessment & Plan Note (Signed)
 Complicated by morbid obesity Continue tramadol  50 mg daily and baclofen  10 mg twice daily as needed Encourage weight loss as this can help reduce joint pain

## 2024-02-18 NOTE — Patient Instructions (Signed)

## 2024-02-18 NOTE — Assessment & Plan Note (Signed)
 Complicated by morbid obesity Controlled on flecainide  100 mg twice daily and furosemide  20 mg daily as needed Reinforced DASH diet and exercise for weight loss Kidney function reviewed

## 2024-02-18 NOTE — Assessment & Plan Note (Signed)
 Status post gastric sleeve Encouraged high-protein, low-carb diet and exercise for weight loss

## 2024-02-18 NOTE — Assessment & Plan Note (Signed)
 On lifelong apixaban  5 mg twice daily

## 2024-02-18 NOTE — Assessment & Plan Note (Signed)
 Continue galcanezumab 120 mg monthly He would like referral to neurology for second opinion

## 2024-02-18 NOTE — Assessment & Plan Note (Signed)
 Uric acid levels reviewed Encourage low purine diet Continue allopurinol  100 mg daily and colchicine  0.6 mg twice daily as needed

## 2024-02-18 NOTE — Assessment & Plan Note (Signed)
 Continue flecainide  100 mg and apixaban  5 mg twice daily He will continue to follow with cardiology

## 2024-02-19 ENCOUNTER — Ambulatory Visit: Admitting: Student

## 2024-02-19 ENCOUNTER — Encounter: Payer: Self-pay | Admitting: Student

## 2024-02-19 VITALS — BP 132/72 | HR 65 | Ht 69.0 in | Wt 258.0 lb

## 2024-02-19 DIAGNOSIS — I480 Paroxysmal atrial fibrillation: Secondary | ICD-10-CM

## 2024-02-19 DIAGNOSIS — G4733 Obstructive sleep apnea (adult) (pediatric): Secondary | ICD-10-CM | POA: Diagnosis not present

## 2024-02-19 DIAGNOSIS — I5032 Chronic diastolic (congestive) heart failure: Secondary | ICD-10-CM

## 2024-02-19 DIAGNOSIS — I10 Essential (primary) hypertension: Secondary | ICD-10-CM

## 2024-02-19 MED ORDER — METOPROLOL SUCCINATE ER 25 MG PO TB24: 12.5000 mg | ORAL_TABLET | Freq: Every morning | ORAL | 3 refills | Status: AC

## 2024-02-19 MED ORDER — FUROSEMIDE 20 MG PO TABS: 20.0000 mg | ORAL_TABLET | Freq: Every day | ORAL | 3 refills | Status: AC | PRN

## 2024-02-19 NOTE — Addendum Note (Signed)
 Addended by: Reace Breshears, KENNY A on: 02/19/2024 10:57 AM   Modules accepted: Orders

## 2024-02-19 NOTE — Patient Instructions (Addendum)
 Medication Instructions:   Your physician recommends the following medication changes.  START TAKING: Metoprolol  Succinate (Toprol  XL) 12.5 mg (1/2 of 25 mg tablet) once daily in the morning.  Please take Furosemide  daily as needed for weight gain of 3 lbs or more in one day (24 hours), or 5 lbs or more in one week (7 days).  *If you need a refill on your cardiac medications before your next appointment, please call your pharmacy*  Lab Work:  None ordered at this time   If you have labs (blood work) drawn today and your tests are completely normal, you will receive your results only by:  MyChart Message (if you have MyChart) OR  A paper copy in the mail If you have any lab test that is abnormal or we need to change your treatment, we will call you to review the results.  Testing/Procedures:  None ordered at this time   Referrals:  None ordered at this time   Follow-Up:  At [facility], you and your health needs are our priority.  As part of our continuing mission to provide you with exceptional heart care, our providers are all part of one team.  This team includes your primary Cardiologist (physician) and Advanced Practice Providers or APPs (Physician Assistants and Nurse Practitioners) who all work together to provide you with the care you need, when you need it.  Your next appointment:   5 - 6 month(s)  Provider:    Deatrice Cage, MD or Barnie Hila, NP    We recommend signing up for the patient portal called MyChart.  Sign up information is provided on this After Visit Summary.  MyChart is used to connect with patients for Virtual Visits (Telemedicine).  Patients are able to view lab/test results, encounter notes, upcoming appointments, etc.  Non-urgent messages can be sent to your provider as well.   To learn more about what you can do with MyChart, go to [email or web address].   Other Instructions  Please take Furosemide  daily as needed for  weight gain of 3 lbs or more in one day (24 hours), or 5 lbs or more in one week (7 days).

## 2024-02-24 ENCOUNTER — Ambulatory Visit

## 2024-03-02 ENCOUNTER — Ambulatory Visit

## 2024-03-09 ENCOUNTER — Ambulatory Visit

## 2024-03-16 ENCOUNTER — Ambulatory Visit

## 2024-03-23 ENCOUNTER — Ambulatory Visit

## 2024-03-30 ENCOUNTER — Ambulatory Visit

## 2024-04-06 ENCOUNTER — Ambulatory Visit

## 2024-07-19 ENCOUNTER — Ambulatory Visit: Admitting: Student

## 2024-08-19 ENCOUNTER — Encounter: Admitting: Internal Medicine

## 2024-08-20 ENCOUNTER — Ambulatory Visit

## 2024-08-25 ENCOUNTER — Ambulatory Visit
